# Patient Record
Sex: Male | Born: 1955
Health system: Southern US, Community
[De-identification: ages and names within clinical notes are randomized; demographics above are authoritative.]

## PROBLEM LIST (undated history)

## (undated) DIAGNOSIS — C4491 Basal cell carcinoma of skin, unspecified: Secondary | ICD-10-CM

## (undated) DIAGNOSIS — F419 Anxiety disorder, unspecified: Secondary | ICD-10-CM

## (undated) DIAGNOSIS — J189 Pneumonia, unspecified organism: Secondary | ICD-10-CM

## (undated) DIAGNOSIS — D049 Carcinoma in situ of skin, unspecified: Secondary | ICD-10-CM

## (undated) DIAGNOSIS — Z9889 Other specified postprocedural states: Secondary | ICD-10-CM

## (undated) DIAGNOSIS — M797 Fibromyalgia: Secondary | ICD-10-CM

## (undated) DIAGNOSIS — B1910 Unspecified viral hepatitis B without hepatic coma: Secondary | ICD-10-CM

## (undated) DIAGNOSIS — K219 Gastro-esophageal reflux disease without esophagitis: Secondary | ICD-10-CM

## (undated) DIAGNOSIS — E785 Hyperlipidemia, unspecified: Secondary | ICD-10-CM

## (undated) DIAGNOSIS — G8929 Other chronic pain: Secondary | ICD-10-CM

## (undated) DIAGNOSIS — E119 Type 2 diabetes mellitus without complications: Secondary | ICD-10-CM

## (undated) DIAGNOSIS — I10 Essential (primary) hypertension: Secondary | ICD-10-CM

## (undated) DIAGNOSIS — I2510 Atherosclerotic heart disease of native coronary artery without angina pectoris: Secondary | ICD-10-CM

## (undated) DIAGNOSIS — G4730 Sleep apnea, unspecified: Secondary | ICD-10-CM

## (undated) DIAGNOSIS — K8020 Calculus of gallbladder without cholecystitis without obstruction: Secondary | ICD-10-CM

## (undated) DIAGNOSIS — K56609 Unspecified intestinal obstruction, unspecified as to partial versus complete obstruction: Secondary | ICD-10-CM

## (undated) DIAGNOSIS — F32A Depression, unspecified: Secondary | ICD-10-CM

## (undated) DIAGNOSIS — R112 Nausea with vomiting, unspecified: Secondary | ICD-10-CM

## (undated) DIAGNOSIS — K635 Polyp of colon: Secondary | ICD-10-CM

## (undated) DIAGNOSIS — F329 Major depressive disorder, single episode, unspecified: Secondary | ICD-10-CM

## (undated) DIAGNOSIS — M1990 Unspecified osteoarthritis, unspecified site: Secondary | ICD-10-CM

## (undated) HISTORY — DX: Basal cell carcinoma of skin, unspecified: C44.91

## (undated) HISTORY — PX: HERNIA REPAIR: SHX51

## (undated) HISTORY — PX: XI ROBOTIC ASSISTED INGUINAL HERNIA REPAIR WITH MESH: SHX6706

## (undated) HISTORY — DX: Sleep apnea, unspecified: G47.30

## (undated) HISTORY — DX: Fibromyalgia: M79.7

## (undated) HISTORY — PX: EYE SURGERY: SHX253

## (undated) HISTORY — PX: BACK SURGERY: SHX140

## (undated) HISTORY — PX: CATARACT EXTRACTION W/ INTRAOCULAR LENS  IMPLANT, BILATERAL: SHX1307

## (undated) HISTORY — PX: SPINAL CORD STIMULATOR INSERTION: SHX5378

## (undated) HISTORY — DX: Gastro-esophageal reflux disease without esophagitis: K21.9

## (undated) HISTORY — DX: Polyp of colon: K63.5

## (undated) HISTORY — DX: Pneumonia, unspecified organism: J18.9

## (undated) HISTORY — PX: SPINE SURGERY: SHX786

## (undated) HISTORY — PX: CHOLECYSTECTOMY, LAPAROSCOPIC: SHX56

## (undated) HISTORY — DX: Unspecified viral hepatitis B without hepatic coma: B19.10

## (undated) HISTORY — PX: LUMBAR FUSION: SHX111

## (undated) HISTORY — DX: Hyperlipidemia, unspecified: E78.5

## (undated) HISTORY — DX: Unspecified osteoarthritis, unspecified site: M19.90

## (undated) HISTORY — PX: CHOLECYSTECTOMY: SHX55

## (undated) HISTORY — DX: Carcinoma in situ of skin, unspecified: D04.9

## (undated) HISTORY — DX: Calculus of gallbladder without cholecystitis without obstruction: K80.20

---
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
## 2004-08-26 HISTORY — PX: MICRODISCECTOMY LUMBAR: SUR864

## 2015-10-09 DIAGNOSIS — N529 Male erectile dysfunction, unspecified: Secondary | ICD-10-CM | POA: Insufficient documentation

## 2015-10-09 DIAGNOSIS — E291 Testicular hypofunction: Secondary | ICD-10-CM | POA: Insufficient documentation

## 2017-05-19 DIAGNOSIS — Z9689 Presence of other specified functional implants: Secondary | ICD-10-CM | POA: Insufficient documentation

## 2019-10-06 DIAGNOSIS — A419 Sepsis, unspecified organism: Secondary | ICD-10-CM | POA: Insufficient documentation

## 2020-01-25 ENCOUNTER — Other Ambulatory Visit (HOSPITAL_COMMUNITY): Payer: Self-pay

## 2020-01-31 ENCOUNTER — Other Ambulatory Visit (HOSPITAL_COMMUNITY): Payer: Self-pay

## 2020-02-29 ENCOUNTER — Other Ambulatory Visit (HOSPITAL_COMMUNITY): Payer: Self-pay

## 2020-03-13 ENCOUNTER — Other Ambulatory Visit (HOSPITAL_COMMUNITY): Payer: Self-pay

## 2020-04-06 ENCOUNTER — Emergency Department (HOSPITAL_BASED_OUTPATIENT_CLINIC_OR_DEPARTMENT_OTHER): Payer: Medicare Other

## 2020-04-06 ENCOUNTER — Inpatient Hospital Stay (HOSPITAL_BASED_OUTPATIENT_CLINIC_OR_DEPARTMENT_OTHER)
Admission: EM | Admit: 2020-04-06 | Discharge: 2020-04-12 | DRG: 390 | Disposition: A | Discharge status: Home / Self Care | Payer: Medicare Other | Attending: Internal Medicine | Admitting: Internal Medicine

## 2020-04-06 ENCOUNTER — Other Ambulatory Visit: Payer: Self-pay

## 2020-04-06 ENCOUNTER — Encounter (HOSPITAL_BASED_OUTPATIENT_CLINIC_OR_DEPARTMENT_OTHER): Payer: Self-pay | Admitting: *Deleted

## 2020-04-06 DIAGNOSIS — E785 Hyperlipidemia, unspecified: Secondary | ICD-10-CM | POA: Diagnosis present

## 2020-04-06 DIAGNOSIS — Z8719 Personal history of other diseases of the digestive system: Secondary | ICD-10-CM

## 2020-04-06 DIAGNOSIS — I10 Essential (primary) hypertension: Secondary | ICD-10-CM

## 2020-04-06 DIAGNOSIS — E876 Hypokalemia: Secondary | ICD-10-CM | POA: Diagnosis not present

## 2020-04-06 DIAGNOSIS — Z825 Family history of asthma and other chronic lower respiratory diseases: Secondary | ICD-10-CM

## 2020-04-06 DIAGNOSIS — K56600 Partial intestinal obstruction, unspecified as to cause: Secondary | ICD-10-CM | POA: Diagnosis not present

## 2020-04-06 DIAGNOSIS — Z79899 Other long term (current) drug therapy: Secondary | ICD-10-CM

## 2020-04-06 DIAGNOSIS — E119 Type 2 diabetes mellitus without complications: Secondary | ICD-10-CM

## 2020-04-06 DIAGNOSIS — R140 Abdominal distension (gaseous): Secondary | ICD-10-CM

## 2020-04-06 DIAGNOSIS — R0902 Hypoxemia: Secondary | ICD-10-CM | POA: Diagnosis present

## 2020-04-06 DIAGNOSIS — R1110 Vomiting, unspecified: Secondary | ICD-10-CM

## 2020-04-06 DIAGNOSIS — E1159 Type 2 diabetes mellitus with other circulatory complications: Secondary | ICD-10-CM

## 2020-04-06 DIAGNOSIS — Z9049 Acquired absence of other specified parts of digestive tract: Secondary | ICD-10-CM

## 2020-04-06 DIAGNOSIS — E860 Dehydration: Secondary | ICD-10-CM | POA: Diagnosis present

## 2020-04-06 DIAGNOSIS — Z20822 Contact with and (suspected) exposure to covid-19: Secondary | ICD-10-CM | POA: Diagnosis present

## 2020-04-06 DIAGNOSIS — Z881 Allergy status to other antibiotic agents status: Secondary | ICD-10-CM

## 2020-04-06 DIAGNOSIS — Z0189 Encounter for other specified special examinations: Secondary | ICD-10-CM

## 2020-04-06 DIAGNOSIS — Z794 Long term (current) use of insulin: Secondary | ICD-10-CM

## 2020-04-06 DIAGNOSIS — Z87891 Personal history of nicotine dependence: Secondary | ICD-10-CM

## 2020-04-06 DIAGNOSIS — Z7982 Long term (current) use of aspirin: Secondary | ICD-10-CM

## 2020-04-06 DIAGNOSIS — K56609 Unspecified intestinal obstruction, unspecified as to partial versus complete obstruction: Secondary | ICD-10-CM

## 2020-04-06 DIAGNOSIS — Z91040 Latex allergy status: Secondary | ICD-10-CM

## 2020-04-06 DIAGNOSIS — N400 Enlarged prostate without lower urinary tract symptoms: Secondary | ICD-10-CM | POA: Diagnosis present

## 2020-04-06 DIAGNOSIS — E1169 Type 2 diabetes mellitus with other specified complication: Secondary | ICD-10-CM

## 2020-04-06 HISTORY — DX: Anxiety disorder, unspecified: F41.9

## 2020-04-06 HISTORY — DX: Depression, unspecified: F32.A

## 2020-04-06 HISTORY — DX: Essential (primary) hypertension: I10

## 2020-04-06 HISTORY — DX: Type 2 diabetes mellitus without complications: E11.9

## 2020-04-06 LAB — URINALYSIS, ROUTINE W REFLEX MICROSCOPIC
Glucose, UA: 100 mg/dL — AB
Hgb urine dipstick: NEGATIVE
Ketones, ur: NEGATIVE mg/dL
Leukocytes,Ua: NEGATIVE
Nitrite: NEGATIVE
Protein, ur: >300 mg/dL — AB
Specific Gravity, Urine: >1.03 — ABNORMAL HIGH (ref 1.005–1.030)
pH: 6 (ref 5.0–8.0)

## 2020-04-06 LAB — CBC WITH DIFFERENTIAL/PLATELET
Abs Immature Granulocytes: 0.06 10*3/uL (ref 0.00–0.07)
Basophils Absolute: 0 10*3/uL (ref 0.0–0.1)
Basophils Relative: 0 %
Eosinophils Absolute: 0 10*3/uL (ref 0.0–0.5)
Eosinophils Relative: 0 %
HCT: 55.7 % — ABNORMAL HIGH (ref 39.0–52.0)
Hemoglobin: 17 g/dL (ref 13.0–17.0)
Immature Granulocytes: 0 %
Lymphocytes Relative: 6 %
Lymphs Abs: 1 10*3/uL (ref 0.7–4.0)
MCH: 26 pg (ref 26.0–34.0)
MCHC: 30.5 g/dL (ref 30.0–36.0)
MCV: 85 fL (ref 80.0–100.0)
Monocytes Absolute: 1.2 10*3/uL — ABNORMAL HIGH (ref 0.1–1.0)
Monocytes Relative: 7 %
Neutro Abs: 14.1 10*3/uL — ABNORMAL HIGH (ref 1.7–7.7)
Neutrophils Relative %: 87 %
Platelets: 427 10*3/uL — ABNORMAL HIGH (ref 150–400)
RBC: 6.55 MIL/uL — ABNORMAL HIGH (ref 4.22–5.81)
RDW: 15 % (ref 11.5–15.5)
WBC: 16.3 10*3/uL — ABNORMAL HIGH (ref 4.0–10.5)
nRBC: 0 % (ref 0.0–0.2)

## 2020-04-06 LAB — COMPREHENSIVE METABOLIC PANEL
ALT: 22 U/L (ref 0–44)
AST: 28 U/L (ref 15–41)
Albumin: 4.9 g/dL (ref 3.5–5.0)
Alkaline Phosphatase: 94 U/L (ref 38–126)
Anion gap: 14 (ref 5–15)
BUN: 14 mg/dL (ref 8–23)
CO2: 26 mmol/L (ref 22–32)
Calcium: 9 mg/dL (ref 8.9–10.3)
Chloride: 97 mmol/L — ABNORMAL LOW (ref 98–111)
Creatinine, Ser: 1.1 mg/dL (ref 0.61–1.24)
GFR calc Af Amer: >60 mL/min (ref >60)
GFR calc non Af Amer: >60 mL/min (ref >60)
Glucose, Bld: 221 mg/dL — ABNORMAL HIGH (ref 70–99)
Potassium: 3.7 mmol/L (ref 3.5–5.1)
Sodium: 137 mmol/L (ref 135–145)
Total Bilirubin: 1.1 mg/dL (ref 0.3–1.2)
Total Protein: 8.5 g/dL — ABNORMAL HIGH (ref 6.5–8.1)

## 2020-04-06 LAB — LIPASE, BLOOD: Lipase: 37 U/L (ref 11–51)

## 2020-04-06 LAB — URINALYSIS, MICROSCOPIC (REFLEX): WBC, UA: NONE SEEN WBC/hpf (ref 0–5)

## 2020-04-06 LAB — SARS CORONAVIRUS 2 BY RT PCR (HOSPITAL ORDER, PERFORMED IN ~~LOC~~ HOSPITAL LAB): SARS Coronavirus 2: NEGATIVE

## 2020-04-06 MED ORDER — IOHEXOL 300 MG/ML  SOLN
100.0000 mL | Freq: Once | INTRAMUSCULAR | Status: AC | PRN
  Administered 2020-04-06: 100 mL via INTRAVENOUS

## 2020-04-06 MED ORDER — SODIUM CHLORIDE 0.9 % IV BOLUS
1000.0000 mL | Freq: Once | INTRAVENOUS | Status: AC
  Administered 2020-04-06: 1000 mL via INTRAVENOUS

## 2020-04-06 MED ORDER — ONDANSETRON HCL 4 MG/2ML IJ SOLN
INTRAMUSCULAR | Status: AC
  Filled 2020-04-06: qty 2

## 2020-04-06 MED ORDER — ONDANSETRON HCL 4 MG/2ML IJ SOLN
4.0000 mg | Freq: Once | INTRAMUSCULAR | Status: AC
  Administered 2020-04-06: 4 mg via INTRAVENOUS

## 2020-04-06 NOTE — ED Triage Notes (Signed)
C/o abd pain n/v/d x 24 hrs

## 2020-04-06 NOTE — ED Notes (Signed)
States has been having N/V and D for the past few days, feels very weak

## 2020-04-06 NOTE — ED Provider Notes (Signed)
[facility] EMERGENCY DEPARTMENT Provider Note   CSN: [id number] Arrival date & time: 04/06/20  1848     History Chief Complaint  Patient presents with  . Diarrhea    Brandon [name] is a 64 y.o. male hx of DM, HTN, presenting with abdominal pain and vomiting.  Patient states that he did ate some Mindi Slicker yesterday and then shortly afterwards started vomiting.  He states that his abdomen is more distended.  He still passing gas and did have some loose stool.  He was concerned that he may have food poisoning.  He states that he actually had gangrenous cholecystitis back in January and required surgery and was in the hospital for 10 days in [place].   The history is provided by the patient.       Past Medical History:  Diagnosis Date  . Anxiety   . Depression   . Diabetes mellitus without complication (HCC)   . Hypertension     There are no problems to display for this patient.   Past Surgical History:  Procedure Laterality Date  . BACK SURGERY    . CHOLECYSTECTOMY    . EYE SURGERY         History reviewed. No pertinent family history.  Social History   Tobacco Use  . Smoking status: Never Smoker  . Smokeless tobacco: Never Used  Substance Use Topics  . Alcohol use: Not Currently  . Drug use: Not Currently    Home Medications Prior to Admission medications   Not on File    Allergies    Ancef [cefazolin], Latex, and Levaquin [levofloxacin]  Review of Systems   Review of Systems  Gastrointestinal: Positive for abdominal distention, diarrhea and vomiting.  All other systems reviewed and are negative.   Physical Exam Updated Vital Signs BP (!) 152/90   Pulse (!) 122   Temp 99 F (37.2 C)   Resp 18   Ht 5\' 9"  (1.753 m)   Wt 105.2 kg   SpO2 98%   BMI 34.26 kg/m   Physical Exam Vitals and nursing note reviewed.  Constitutional:      Comments: Uncomfortable, vomiting, dehydrated   HENT:     Head: Normocephalic.     Nose: Nose  normal.     Mouth/Throat:     Mouth: Mucous membranes are dry.  Eyes:     Pupils: Pupils are equal, round, and reactive to light.  Cardiovascular:     Rate and Rhythm: Normal rate and regular rhythm.     Pulses: Normal pulses.     Heart sounds: Normal heart sounds.  Pulmonary:     Effort: Pulmonary effort is normal.     Breath sounds: Normal breath sounds.  Abdominal:     Comments: Distended, + RUQ tenderness and mild guarding  Musculoskeletal:        General: Normal range of motion.     Cervical back: Normal range of motion.  Skin:    General: Skin is warm.     Capillary Refill: Capillary refill takes less than 2 seconds.  Neurological:     General: No focal deficit present.     Mental Status: He is alert and oriented to person, place, and time.  Psychiatric:        Mood and Affect: Mood normal.        Behavior: Behavior normal.     ED Results / Procedures / Treatments   Labs (all labs ordered are listed, but only abnormal results  are displayed) Labs Reviewed  CBC WITH DIFFERENTIAL/PLATELET - Abnormal; Notable for the following components:      Result Value   WBC 16.3 (*)    RBC 6.55 (*)    HCT 55.7 (*)    Platelets 427 (*)    Neutro Abs 14.1 (*)    Monocytes Absolute 1.2 (*)    All other components within normal limits  COMPREHENSIVE METABOLIC PANEL - Abnormal; Notable for the following components:   Chloride 97 (*)    Glucose, Bld 221 (*)    Total Protein 8.5 (*)    All other components within normal limits  URINALYSIS, ROUTINE W REFLEX MICROSCOPIC - Abnormal; Notable for the following components:   Specific Gravity, Urine >1.030 (*)    Glucose, UA 100 (*)    Bilirubin Urine SMALL (*)    Protein, ur >300 (*)    All other components within normal limits  URINALYSIS, MICROSCOPIC (REFLEX) - Abnormal; Notable for the following components:   Bacteria, UA RARE (*)    All other components within normal limits  SARS CORONAVIRUS 2 BY RT PCR (HOSPITAL ORDER, PERFORMED  IN [facility])  LIPASE, BLOOD    EKG None  Radiology CT ABDOMEN PELVIS W CONTRAST  Result Date: 04/06/2020 CLINICAL DATA:  Abdominal pain, nausea, vomiting, diarrhea EXAM: CT ABDOMEN AND PELVIS WITH CONTRAST TECHNIQUE: Multidetector CT imaging of the abdomen and pelvis was performed using the standard protocol following bolus administration of intravenous contrast. CONTRAST:  OMNIPAQUE IOHEXOL 300 MG/ML  SOLN COMPARISON:  None. FINDINGS: Lower chest: Moderate right coronary artery calcification. Global cardiac size within normal limits. No pericardial effusion. Visualized lung bases are clear bilaterally. Hepatobiliary: Mild hepatic steatosis. Liver otherwise unremarkable. Status post cholecystectomy. No intra or extrahepatic biliary ductal dilation. Pancreas: Unremarkable Spleen: Unremarkable Adrenals/Urinary Tract: The adrenal glands are unremarkable. The kidneys are normal in size and position. Simple cortical cyst noted within the upper pole of the right kidney. The kidneys are otherwise unremarkable. The bladder is unremarkable. Stomach/Bowel: A developing mid small bowel obstruction is identified with a point of transition seen within the right mid abdomen best noted on axial image # 42, sagittal image # 44 and coronal image # 17. Mild tethering of the small bowel in this location suggests that this may relate to an underlying adhesion. The obstruction appears incomplete at this point as the a small amount of gas and fluid is seen downstream from this point. The stomach is unremarkable. Mild sigmoid and descending colonic diverticulosis. The large bowel is otherwise unremarkable. Appendix normal. No free intraperitoneal gas or fluid. Vascular/Lymphatic: The abdominal vasculature is age-appropriate with mild aortoiliac atherosclerotic calcification. No aneurysm. No pathologic adenopathy within the abdomen and pelvis. Reproductive: Prostate is unremarkable. Penile prosthesis is in  place with the reservoir noted within the left inferior rectus sheath. Other: Tiny right spigelian hernia is seen with herniation of a small amount of mesenteric fat. Musculoskeletal: L4-5 lumbar fusion with instrumentation has been performed. Dorsal column stimulator leads enter the spinal canal at L1-2 with the power pack placed within the subcutaneous fat of the left gluteal region. No acute bone abnormality. IMPRESSION: 1. High-grade partial or developing complete mid small bowel obstruction with a point of transition seen within the right mid abdomen. 2. Mild hepatic steatosis. Aortic Atherosclerosis (ICD10-I70.0). Electronically Signed   By: Helyn Numbers MD   On: 04/06/2020 22:31    Procedures Procedures (including critical care time)  Medications Ordered in ED Medications  ondansetron (  ZOFRAN) 4 MG/2ML injection (has no administration in time range)  sodium chloride 0.9 % bolus 1,000 mL (1,000 mLs Intravenous New Bag/Given 04/06/20 2137)  ondansetron (ZOFRAN) injection 4 mg (4 mg Intravenous Given 04/06/20 2136)  iohexol (OMNIPAQUE) 300 MG/ML solution 100 mL (100 mLs Intravenous Contrast Given 04/06/20 2205)    ED Course  I have reviewed the triage vital signs and the nursing notes.  Pertinent labs & imaging results that were available during my care of the patient were reviewed by me and considered in my medical decision making (see chart for details).    MDM Rules/Calculators/A&P                         Brandon [name] is a 64 y.o. male here presenting with abdominal pain and vomiting.  Patient states that he ate Mindi Slicker so consider gastroenteritis.  He also had cholecystectomy [place] so consider small bowel obstruction as well.  Plan to get CBC, CMP, CT abdomen pelvis.  Will hydrate and give Zofran and reassess.  10:54 PM CT showed near complete small bowel obstruction with transition point at the right mid quadrant.  His white blood cell count is 16 and his Covid is negative.  I  talked to Dr. Sid Falcon from surgery who will see patient as consult.  I ordered NG tube and patient will be admitted to the medicine service.   Final Clinical Impression(s) / ED Diagnoses Final diagnoses:  None    Rx / DC Orders ED Discharge Orders    None       Charlynne Pander, MD 04/06/20 2255

## 2020-04-06 NOTE — ED Notes (Signed)
EDP in to speak with pt re: CT results, will keep pt NPO until further orders

## 2020-04-06 NOTE — ED Notes (Signed)
Had Laparoscopic Chole in Feb 2021

## 2020-04-07 ENCOUNTER — Inpatient Hospital Stay (HOSPITAL_COMMUNITY): Payer: Medicare Other

## 2020-04-07 ENCOUNTER — Encounter (HOSPITAL_COMMUNITY): Payer: Self-pay | Admitting: Family Medicine

## 2020-04-07 DIAGNOSIS — E1159 Type 2 diabetes mellitus with other circulatory complications: Secondary | ICD-10-CM

## 2020-04-07 DIAGNOSIS — Z794 Long term (current) use of insulin: Secondary | ICD-10-CM | POA: Diagnosis not present

## 2020-04-07 DIAGNOSIS — I10 Essential (primary) hypertension: Secondary | ICD-10-CM | POA: Diagnosis present

## 2020-04-07 DIAGNOSIS — Z7982 Long term (current) use of aspirin: Secondary | ICD-10-CM | POA: Diagnosis not present

## 2020-04-07 DIAGNOSIS — E860 Dehydration: Secondary | ICD-10-CM | POA: Diagnosis present

## 2020-04-07 DIAGNOSIS — Z881 Allergy status to other antibiotic agents status: Secondary | ICD-10-CM | POA: Diagnosis not present

## 2020-04-07 DIAGNOSIS — K56609 Unspecified intestinal obstruction, unspecified as to partial versus complete obstruction: Secondary | ICD-10-CM | POA: Diagnosis present

## 2020-04-07 DIAGNOSIS — R0902 Hypoxemia: Secondary | ICD-10-CM | POA: Diagnosis present

## 2020-04-07 DIAGNOSIS — E119 Type 2 diabetes mellitus without complications: Secondary | ICD-10-CM

## 2020-04-07 DIAGNOSIS — Z0189 Encounter for other specified special examinations: Secondary | ICD-10-CM | POA: Diagnosis not present

## 2020-04-07 DIAGNOSIS — E785 Hyperlipidemia, unspecified: Secondary | ICD-10-CM | POA: Diagnosis present

## 2020-04-07 DIAGNOSIS — Z9049 Acquired absence of other specified parts of digestive tract: Secondary | ICD-10-CM | POA: Diagnosis not present

## 2020-04-07 DIAGNOSIS — Z79899 Other long term (current) drug therapy: Secondary | ICD-10-CM | POA: Diagnosis not present

## 2020-04-07 DIAGNOSIS — Z87891 Personal history of nicotine dependence: Secondary | ICD-10-CM | POA: Diagnosis not present

## 2020-04-07 DIAGNOSIS — Z8719 Personal history of other diseases of the digestive system: Secondary | ICD-10-CM | POA: Diagnosis not present

## 2020-04-07 DIAGNOSIS — K56600 Partial intestinal obstruction, unspecified as to cause: Secondary | ICD-10-CM | POA: Diagnosis present

## 2020-04-07 DIAGNOSIS — Z91040 Latex allergy status: Secondary | ICD-10-CM | POA: Diagnosis not present

## 2020-04-07 DIAGNOSIS — E876 Hypokalemia: Secondary | ICD-10-CM | POA: Diagnosis not present

## 2020-04-07 DIAGNOSIS — E1169 Type 2 diabetes mellitus with other specified complication: Secondary | ICD-10-CM

## 2020-04-07 DIAGNOSIS — Z20822 Contact with and (suspected) exposure to covid-19: Secondary | ICD-10-CM | POA: Diagnosis present

## 2020-04-07 DIAGNOSIS — N400 Enlarged prostate without lower urinary tract symptoms: Secondary | ICD-10-CM | POA: Diagnosis present

## 2020-04-07 DIAGNOSIS — Z825 Family history of asthma and other chronic lower respiratory diseases: Secondary | ICD-10-CM | POA: Diagnosis not present

## 2020-04-07 LAB — CBC
HCT: 52.3 % — ABNORMAL HIGH (ref 39.0–52.0)
Hemoglobin: 16.1 g/dL (ref 13.0–17.0)
MCH: 26.3 pg (ref 26.0–34.0)
MCHC: 30.8 g/dL (ref 30.0–36.0)
MCV: 85.5 fL (ref 80.0–100.0)
Platelets: 362 10*3/uL (ref 150–400)
RBC: 6.12 MIL/uL — ABNORMAL HIGH (ref 4.22–5.81)
RDW: 14.6 % (ref 11.5–15.5)
WBC: 9.1 10*3/uL (ref 4.0–10.5)
nRBC: 0 % (ref 0.0–0.2)

## 2020-04-07 LAB — GLUCOSE, CAPILLARY
Glucose-Capillary: 184 mg/dL — ABNORMAL HIGH (ref 70–99)
Glucose-Capillary: 197 mg/dL — ABNORMAL HIGH (ref 70–99)
Glucose-Capillary: 159 mg/dL — ABNORMAL HIGH (ref 70–99)
Glucose-Capillary: 166 mg/dL — ABNORMAL HIGH (ref 70–99)
Glucose-Capillary: 284 mg/dL — ABNORMAL HIGH (ref 70–99)

## 2020-04-07 LAB — CREATININE, SERUM
Creatinine, Ser: 0.89 mg/dL (ref 0.61–1.24)
GFR calc Af Amer: >60 mL/min (ref >60)
GFR calc non Af Amer: >60 mL/min (ref >60)

## 2020-04-07 LAB — HEMOGLOBIN A1C
Hgb A1c MFr Bld: 7.2 % — ABNORMAL HIGH (ref 4.8–5.6)
Mean Plasma Glucose: 159.94 mg/dL

## 2020-04-07 LAB — HIV ANTIBODY (ROUTINE TESTING W REFLEX): HIV Screen 4th Generation wRfx: NONREACTIVE

## 2020-04-07 MED ORDER — ENOXAPARIN SODIUM 40 MG/0.4ML ~~LOC~~ SOLN
40.0000 mg | SUBCUTANEOUS | Status: DC
  Administered 2020-04-07 – 2020-04-12 (×6): 40 mg via SUBCUTANEOUS
  Filled 2020-04-07 (×6): qty 0.4

## 2020-04-07 MED ORDER — MORPHINE SULFATE (PF) 4 MG/ML IV SOLN
4.0000 mg | Freq: Once | INTRAVENOUS | Status: AC
  Administered 2020-04-07: 4 mg via INTRAVENOUS
  Filled 2020-04-07: qty 1

## 2020-04-07 MED ORDER — ONDANSETRON HCL 4 MG/2ML IJ SOLN
4.0000 mg | Freq: Once | INTRAMUSCULAR | Status: AC
  Administered 2020-04-07: 4 mg via INTRAVENOUS
  Filled 2020-04-07: qty 2

## 2020-04-07 MED ORDER — DIATRIZOATE MEGLUMINE & SODIUM 66-10 % PO SOLN
90.0000 mL | Freq: Once | ORAL | Status: AC
  Administered 2020-04-07: 90 mL via NASOGASTRIC
  Filled 2020-04-07: qty 90

## 2020-04-07 MED ORDER — POTASSIUM CHLORIDE IN NACL 40-0.9 MEQ/L-% IV SOLN
INTRAVENOUS | Status: DC
  Filled 2020-04-07: qty 1000

## 2020-04-07 MED ORDER — ONDANSETRON HCL 4 MG PO TABS: 4.0000 mg | ORAL_TABLET | Freq: Four times a day (QID) | ORAL | Status: DC | PRN

## 2020-04-07 MED ORDER — LORAZEPAM 2 MG/ML IJ SOLN
0.5000 mg | Freq: Four times a day (QID) | INTRAMUSCULAR | Status: DC | PRN
  Administered 2020-04-07 – 2020-04-10 (×3): 0.5 mg via INTRAVENOUS
  Filled 2020-04-07 (×3): qty 1

## 2020-04-07 MED ORDER — TAMSULOSIN HCL 0.4 MG PO CAPS
0.4000 mg | ORAL_CAPSULE | Freq: Every day | ORAL | Status: DC
  Administered 2020-04-09 – 2020-04-12 (×4): 0.4 mg via ORAL
  Filled 2020-04-07 (×5): qty 1

## 2020-04-07 MED ORDER — HYDRALAZINE HCL 25 MG PO TABS: 25.0000 mg | ORAL_TABLET | Freq: Four times a day (QID) | ORAL | Status: DC | PRN

## 2020-04-07 MED ORDER — ONDANSETRON HCL 4 MG/2ML IJ SOLN
4.0000 mg | Freq: Four times a day (QID) | INTRAMUSCULAR | Status: DC | PRN
  Administered 2020-04-08 (×3): 4 mg via INTRAVENOUS
  Filled 2020-04-07 (×3): qty 2

## 2020-04-07 MED ORDER — POTASSIUM CHLORIDE 2 MEQ/ML IV SOLN: INTRAVENOUS | Status: DC

## 2020-04-07 MED ORDER — INSULIN DETEMIR 100 UNIT/ML ~~LOC~~ SOLN
5.0000 [IU] | Freq: Two times a day (BID) | SUBCUTANEOUS | Status: DC
  Administered 2020-04-07 – 2020-04-12 (×11): 5 [IU] via SUBCUTANEOUS
  Filled 2020-04-07 (×13): qty 0.05

## 2020-04-07 MED ORDER — SODIUM CHLORIDE 0.9 % IV BOLUS
1000.0000 mL | Freq: Once | INTRAVENOUS | Status: AC
  Administered 2020-04-07: 1000 mL via INTRAVENOUS

## 2020-04-07 MED ORDER — MORPHINE SULFATE (PF) 4 MG/ML IV SOLN
4.0000 mg | INTRAVENOUS | Status: DC | PRN
  Administered 2020-04-08: 4 mg via INTRAVENOUS
  Filled 2020-04-07: qty 1

## 2020-04-07 MED ORDER — LORAZEPAM 2 MG/ML IJ SOLN
1.0000 mg | Freq: Once | INTRAMUSCULAR | Status: AC
  Administered 2020-04-07: 1 mg via INTRAVENOUS
  Filled 2020-04-07: qty 1

## 2020-04-07 MED ORDER — ORAL CARE MOUTH RINSE
15.0000 mL | Freq: Two times a day (BID) | OROMUCOSAL | Status: DC
  Administered 2020-04-07 – 2020-04-12 (×7): 15 mL via OROMUCOSAL

## 2020-04-07 MED ORDER — INSULIN ASPART 100 UNIT/ML ~~LOC~~ SOLN
0.0000 [IU] | SUBCUTANEOUS | Status: DC
  Administered 2020-04-07 (×3): 2 [IU] via SUBCUTANEOUS
  Administered 2020-04-07: 5 [IU] via SUBCUTANEOUS
  Administered 2020-04-08: 3 [IU] via SUBCUTANEOUS
  Administered 2020-04-08: 1 [IU] via SUBCUTANEOUS
  Administered 2020-04-08: 2 [IU] via SUBCUTANEOUS
  Administered 2020-04-08: 1 [IU] via SUBCUTANEOUS
  Administered 2020-04-09 (×3): 2 [IU] via SUBCUTANEOUS
  Administered 2020-04-09 – 2020-04-10 (×4): 1 [IU] via SUBCUTANEOUS
  Administered 2020-04-10: 2 [IU] via SUBCUTANEOUS
  Administered 2020-04-10: 1 [IU] via SUBCUTANEOUS
  Administered 2020-04-10: 2 [IU] via SUBCUTANEOUS
  Administered 2020-04-11 (×2): 1 [IU] via SUBCUTANEOUS
  Administered 2020-04-11: 2 [IU] via SUBCUTANEOUS
  Administered 2020-04-11: 1 [IU] via SUBCUTANEOUS
  Administered 2020-04-11 – 2020-04-12 (×3): 2 [IU] via SUBCUTANEOUS
  Administered 2020-04-12: 1 [IU] via SUBCUTANEOUS

## 2020-04-07 MED ORDER — KCL IN DEXTROSE-NACL 40-5-0.45 MEQ/L-%-% IV SOLN
INTRAVENOUS | Status: DC
  Filled 2020-04-07 (×3): qty 1000

## 2020-04-07 NOTE — Progress Notes (Signed)
Day shift nurse informed per MD that NG tube can be clamped and if less than 300 ml residual after tube is unclamped that NG tube can be pulled. Given report to unclamp tube at 2130 and check for residual, tube was unclamped @ 2130 and only approximately 100 ml residual, tube was pulled at 2245. Patient has no complaints of N&V prior to pulling tube and currently has none. Patient is consuming oral fluids. Patient is stable and will continue to be monitored. Marcelle Overlie

## 2020-04-07 NOTE — ED Notes (Signed)
Pt was incontinent of moderate amount of liquid yellowish brown stool  Peri care performed  Brief applied

## 2020-04-07 NOTE — ED Notes (Signed)
Safety measures in place, sr x 2 up, wife at side, call bell within reach, stretcher in lowest position. Instructed not to get up without staff in room

## 2020-04-07 NOTE — ED Notes (Signed)
Post IV ativan administration, began closing eyes, appears more comfortable, not as restless, POX decreased to 88% on RA, 2lpm via [place] initiated , POX increased to 93%, increased to 4lpm via [place], will cont to monitor and observe

## 2020-04-07 NOTE — Progress Notes (Signed)
Pt is very anxious, requesting for Ativan. MD is notified via secured chat. New orders received.

## 2020-04-07 NOTE — TOC Progression Note (Signed)
Transition of Care [facility]) - Progression Note    Patient Details  Name: Brandon [name] MRN: [id number] Date of Birth: 12-08-1955  Transition of Care [facility]) CM/SW Contact  Clearance Coots, LCSW Phone Number: 04/07/2020, 1:52 PM  Clinical Narrative:    Patient recently moved from [place] and inquired about PCP's in the area. CSW instructed the patient to call the number on the back of his insurance card to determine providers in network with his insurance. Patient report understanding and will follow up. CSW will make self available if any additional questions arise.     Expected Discharge Plan: Home/Self Care Barriers to Discharge: Continued Medical Work up  Expected Discharge Plan and Services Expected Discharge Plan: Home/Self Care                         DME Arranged: N/A         HH Arranged: NA HH Agency: NA         Social Determinants of Health (SDOH) Interventions    Readmission Risk Interventions No flowsheet data found.

## 2020-04-07 NOTE — H&P (Signed)
History and Physical  MIKAELE STECHER WCB:[id number] DOB: 03/05/1956 DOA: 04/06/2020  PCP: Patient, No Pcp Per Patient coming from: Home  I have personally briefly reviewed patient's old medical records in [facility]   Chief Complaint: Abdominal pain and diarrhea accompanied by nausea and vomiting  HPI: BAYLEY HURN is a 64 y.o. male past medical history of with a recent history of gangrenous cholecystitis back in January where he spent 10 days in the hospital in [place] where he used to live, diabetes mellitus and hypertension comes in complaining of abdominal pain and vomiting that started the day prior to admission.  He stated his abdomen was more more distended he started passing gas and having some loose stools.  [facility]: To be tachycardic and hypoxic, white blood cell count of 16.3 chest CT showed small bowel obstruction, NG tube was placed per hour 1000 L, was started on IV fluids and transferred to [facility].   Review of Systems: All systems reviewed and apart from history of presenting illness, are negative.  Past Medical History:  Diagnosis Date  . Anxiety   . Depression   . Diabetes mellitus without complication (HCC)   . Hypertension    Past Surgical History:  Procedure Laterality Date  . BACK SURGERY    . CHOLECYSTECTOMY    . EYE SURGERY     Social History:  reports that he has quit smoking. He has never used smokeless tobacco. He reports previous alcohol use. He reports previous drug use.   Allergies  Allergen Reactions  . Ancef [Cefazolin]   . Latex   . Levaquin [Levofloxacin]     Family History  Problem Relation Age of Onset  . COPD Mother   . Cancer Father      Prior to Admission medications   Medication Sig Start Date End Date Taking? Authorizing Provider  AMLODIPINE BENZOATE PO Take 10 mg by mouth.   Yes [provider]  aspirin 81 MG chewable tablet Chew 81 mg by mouth daily.   Yes [provider]    empagliflozin (JARDIANCE) 25 MG TABS tablet Take 25 mg by mouth daily.   Yes [provider]  Insulin Degludec (TRESIBA) 100 UNIT/ML SOLN Inject 60 Units into the skin.   Yes [provider]  insulin lispro (HUMALOG) 100 UNIT/ML injection Inject into the skin 3 (three) times daily before meals.   Yes [provider]  lovastatin (ALTOPREV) 40 MG 24 hr tablet Take 40 mg by mouth at bedtime.   Yes [provider]  tamsulosin (FLOMAX) 0.4 MG CAPS capsule Take 0.4 mg by mouth.   Yes [provider]   Physical Exam: Vitals:   04/07/20 0130 04/07/20 0256 04/07/20 0432 04/07/20 0614  BP: (!) 154/90 (!) 164/95 (!) 141/92 133/86  Pulse: (!) 112 (!) 113 (!) 109 (!) 107  Resp: 16 14  17   Temp:    98.5 F (36.9 C)  TempSrc:    Oral  SpO2: 96% 95% 98% 97%  Weight:      Height:         General exam: Moderately built and nourished patient, lying comfortably supine on the gurney in no obvious distress.  Head, eyes and ENT: Nontraumatic and normocephalic. Pupils equally reacting to light and accommodation. Oral mucosa moist.  Neck: Supple. No JVD, carotid bruit or thyromegaly.  Lymphatics: No lymphadenopathy.  Respiratory system: Clear to auscultation. No increased work of breathing.  Cardiovascular system: S1 and S2  heard, RRR. No JVD, murmurs, gallops, clicks or pedal edema.  Gastrointestinal system: Positive bowel sounds distended soft with diffuse tenderness tympanic no rebound or guarding  Central nervous system: Alert and oriented. No focal neurological deficits.  Extremities: Symmetric 5 x 5 power. Peripheral pulses symmetrically felt.   Skin: No rashes or acute findings.  Musculoskeletal system: Negative exam.  Psychiatry: Pleasant and cooperative.   Labs on Admission:  Basic Metabolic Panel: Recent Labs  Lab 04/06/20 1906  NA 137  K 3.7  CL 97*  CO2 26  GLUCOSE 221*  BUN 14  CREATININE 1.10  CALCIUM 9.0   Liver Function  Tests: Recent Labs  Lab 04/06/20 1906  AST 28  ALT 22  ALKPHOS 94  BILITOT 1.1  PROT 8.5*  ALBUMIN 4.9   Recent Labs  Lab 04/06/20 1906  LIPASE 37   No results for input(s): AMMONIA in the last 168 hours. CBC: Recent Labs  Lab 04/06/20 1906  WBC 16.3*  NEUTROABS 14.1*  HGB 17.0  HCT 55.7*  MCV 85.0  PLT 427*   Cardiac Enzymes: No results for input(s): CKTOTAL, CKMB, CKMBINDEX, TROPONINI in the last 168 hours.  BNP (last 3 results) No results for input(s): PROBNP in the last 8760 hours. CBG: Recent Labs  Lab 04/07/20 0609  GLUCAP 184*    Radiological Exams on Admission: DG Abdomen 1 View  Result Date: 04/06/2020 CLINICAL DATA:  NG tube placement EXAM: ABDOMEN - 1 VIEW COMPARISON:  04/06/2020 FINDINGS: Ascending thoracic stimulator leads. Esophageal tube tip and side port overlie the proximal stomach. Partially visualized dilated small bowel consistent with obstruction. IMPRESSION: Esophageal tube tip overlies the proximal stomach. Electronically Signed   By: Jasmine Pang M.D.   On: 04/06/2020 23:43   CT ABDOMEN PELVIS W CONTRAST  Result Date: 04/06/2020 CLINICAL DATA:  Abdominal pain, nausea, vomiting, diarrhea EXAM: CT ABDOMEN AND PELVIS WITH CONTRAST TECHNIQUE: Multidetector CT imaging of the abdomen and pelvis was performed using the standard protocol following bolus administration of intravenous contrast. CONTRAST:  OMNIPAQUE IOHEXOL 300 MG/ML  SOLN COMPARISON:  None. FINDINGS: Lower chest: Moderate right coronary artery calcification. Global cardiac size within normal limits. No pericardial effusion. Visualized lung bases are clear bilaterally. Hepatobiliary: Mild hepatic steatosis. Liver otherwise unremarkable. Status post cholecystectomy. No intra or extrahepatic biliary ductal dilation. Pancreas: Unremarkable Spleen: Unremarkable Adrenals/Urinary Tract: The adrenal glands are unremarkable. The kidneys are normal in size and position. Simple cortical cyst  noted within the upper pole of the right kidney. The kidneys are otherwise unremarkable. The bladder is unremarkable. Stomach/Bowel: A developing mid small bowel obstruction is identified with a point of transition seen within the right mid abdomen best noted on axial image # 42, sagittal image # 44 and coronal image # 17. Mild tethering of the small bowel in this location suggests that this may relate to an underlying adhesion. The obstruction appears incomplete at this point as the a small amount of gas and fluid is seen downstream from this point. The stomach is unremarkable. Mild sigmoid and descending colonic diverticulosis. The large bowel is otherwise unremarkable. Appendix normal. No free intraperitoneal gas or fluid. Vascular/Lymphatic: The abdominal vasculature is age-appropriate with mild aortoiliac atherosclerotic calcification. No aneurysm. No pathologic adenopathy within the abdomen and pelvis. Reproductive: Prostate is unremarkable. Penile prosthesis is in place with the reservoir noted within the left inferior rectus sheath. Other: Tiny right spigelian hernia is seen with herniation of a small amount of mesenteric fat. Musculoskeletal: L4-5 lumbar fusion with  instrumentation has been performed. Dorsal column stimulator leads enter the spinal canal at L1-2 with the power pack placed within the subcutaneous fat of the left gluteal region. No acute bone abnormality. IMPRESSION: 1. High-grade partial or developing complete mid small bowel obstruction with a point of transition seen within the right mid abdomen. 2. Mild hepatic steatosis. Aortic Atherosclerosis (ICD10-I70.0). Electronically Signed   By: Helyn Numbers MD   On: 04/06/2020 22:31    EKG: Independently reviewed.  Pending  Assessment/Plan SBO (small bowel obstruction) (HCC) Had recent cholecystectomy due to a gangrenous gallbladder. We will place n.p.o. drop NG tube to intermittent suction. Monitor electrolytes and replete as needed.   Start him on D5 half-normal with potassium supplements.  Ambulate as much as possible. We will start him on the small bowel protocol, will consult surgery.  Essential hypertension Hold all antihypertensive medication, will give him hydralazine IV..  Diabetes mellitus type 2, controlled, without complications (HCC) Check an A1c, currently n.p.o. start him on Levemir 5 units twice daily and sliding scale scale insulin, remember that he is n.p.o. CBGs every 4.    DVT Prophylaxis: lovenox Code Status: full  Family Communication: wife  Disposition Plan: inpatient, per surgery      It is my clinical opinion that admission to INPATIENT is reasonable and necessary in this 64 y.o. male past medical history of hypertension and diabetes with recent head cholecystectomy comes in with abdominal pain nausea vomiting and diarrhea was found to have SBO NG tube was placed he was started on IV fluids and narcotics for pain control surgery has been consulted.  Given the aforementioned, the predictability of an adverse outcome is felt to be significant. I expect that the patient will require at least 2 midnights in the hospital to treat this condition.  Marinda Elk MD [facility]   04/07/2020, 7:26 AM

## 2020-04-07 NOTE — ED Notes (Signed)
Report given to [facility]. 

## 2020-04-07 NOTE — ED Notes (Signed)
Pt transferred to [facility] via [facility]

## 2020-04-07 NOTE — ED Notes (Signed)
KUB requested for NG Tube validation, reviewed by this RN and EDP

## 2020-04-07 NOTE — Consult Note (Signed)
[facility] Surgery Consult Note  Brandon [name] 1956/03/16  [id number].    Requesting MD: Marinda Elk Chief Complaint/Reason for Consult: SBO  HPI:  Brandon [name] is a 64yo male PMH HTN, DM, HLD who was admitted to [facility] last night with SBO. Patient states that about 2 days ago he developed diffuse abdominal pain. Pain gradually became worse and more constant. Associated with multiple episodes of nausea and vomiting. He also reports several loose stools, last BM yesterday evening. No flatus today. No prior h/o SBO. In the ED he was noted to be tachycardic and hypoxic. Afebrile. WBC 16.3, now 9.1. CT scan shows high-grade partial or developing complete mid small bowel obstruction with a point of transition seen within the right mid abdomen. NG tube was placed and patient admitted to the medical service. General surgery asked to see.  -Abdominal surgical history: right inguinal hernia repair x2, umbilical hernia repair, laparoscopic cholecystectomy 08/2019 >> all surgeries done in [place] -Anticoagulants: none -Former smoker, quit in 2006 -Just moved to [place] from [place] in July 2021 -Employment: retired Buyer, retail, worked at [employer] years ago  Review of Systems  Constitutional: Positive for malaise/fatigue. Negative for fever.  Respiratory: Negative.   Cardiovascular: Negative.   Gastrointestinal: Positive for abdominal pain, diarrhea, nausea and vomiting.  Genitourinary: Negative.   Musculoskeletal: Negative.   Skin: Negative.    All systems reviewed and otherwise negative except for as above  Family History  Problem Relation Age of Onset  . COPD Mother   . Cancer Father     Past Medical History:  Diagnosis Date  . Anxiety   . Depression   . Diabetes mellitus without complication (HCC)   . Hypertension     Past Surgical History:  Procedure Laterality Date  . BACK SURGERY    . CHOLECYSTECTOMY    . EYE SURGERY      Social History:  reports that  he has quit smoking. He has never used smokeless tobacco. He reports previous alcohol use. He reports previous drug use.  Allergies:  Allergies  Allergen Reactions  . Ancef [Cefazolin] Rash  . Latex Rash  . Levaquin [Levofloxacin] Rash    Medications Prior to Admission  Medication Sig Dispense Refill  . amLODipine (NORVASC) 10 MG tablet Take 10 mg by mouth daily.    . ARIPiprazole (ABILIFY) 10 MG tablet Take 10 mg by mouth daily.    Marland Kitchen aspirin 81 MG chewable tablet Chew 81 mg by mouth daily.    . empagliflozin (JARDIANCE) 25 MG TABS tablet Take 25 mg by mouth daily.    . Insulin Degludec (TRESIBA) 100 UNIT/ML SOLN Inject 60 Units into the skin.    Marland Kitchen insulin lispro (HUMALOG) 100 UNIT/ML injection Inject 0-10 Units into the skin 3 (three) times daily before meals. Sliding Scale    . lovastatin (ALTOPREV) 40 MG 24 hr tablet Take 40 mg by mouth at bedtime.    . pantoprazole (PROTONIX) 40 MG tablet Take 40 mg by mouth daily.    . pioglitazone (ACTOS) 30 MG tablet Take 30 mg by mouth daily.    . tamsulosin (FLOMAX) 0.4 MG CAPS capsule Take 0.4 mg by mouth.      Prior to Admission medications   Medication Sig Start Date End Date Taking? Authorizing Provider  amLODipine (NORVASC) 10 MG tablet Take 10 mg by mouth daily.   Yes [provider]  ARIPiprazole (ABILIFY) 10 MG tablet Take 10 mg by mouth daily.   Yes [provider]  aspirin 81 MG chewable tablet Chew 81 mg by mouth daily.   Yes [provider]  empagliflozin (JARDIANCE) 25 MG TABS tablet Take 25 mg by mouth daily.   Yes [provider]  Insulin Degludec (TRESIBA) 100 UNIT/ML SOLN Inject 60 Units into the skin.   Yes [provider]  insulin lispro (HUMALOG) 100 UNIT/ML injection Inject 0-10 Units into the skin 3 (three) times daily before meals. Sliding Scale   Yes [provider]  lovastatin (ALTOPREV) 40 MG 24 hr tablet Take 40 mg by mouth at bedtime.   Yes [provider]  pantoprazole (PROTONIX) 40 MG tablet Take 40 mg by mouth daily.   Yes [provider]  pioglitazone (ACTOS) 30 MG tablet Take 30 mg by mouth daily.   Yes [provider]  tamsulosin (FLOMAX) 0.4 MG CAPS capsule Take 0.4 mg by mouth.   Yes [provider]    Blood pressure (!) 143/75, pulse 97, temperature 98.4 F (36.9 C), temperature source Oral, resp. rate 18, height 5\' 9"  (1.753 m), weight 105.2 kg, SpO2 100 %. Physical Exam: General: pleasant, WD/WN white male who is laying in bed in NAD HEENT: head is normocephalic, atraumatic.  Sclera are noninjected.  PERRL.  Ears and nose without any masses or lesions.  Mouth is pink and moist. Dentition fair Heart: regular, rate, and rhythm.  Normal s1,s2. No obvious murmurs, gallops, or rubs noted.  Palpable pedal pulses bilaterally  Lungs: CTAB, no wheezes, rhonchi, or rales noted.  Respiratory effort nonlabored Abd: soft, mild distension, +BS, no masses, hernias, or organomegaly, mild diffuse TTP without rebound or guarding MS: no BUE/BLE edema, calves soft and nontender Skin: warm and dry with no masses, lesions, or rashes Psych: A&Ox4 with an appropriate affect Neuro: cranial nerves grossly intact, equal strength in BUE/BLE bilaterally, normal speech, thought process intact  Results for orders placed or performed during the hospital encounter of 04/06/20 (from the past 48 hour(s))  CBC with Differential     Status: Abnormal   Collection Time: 04/06/20  7:06 PM  Result Value Ref Range   WBC 16.3 (H) 4.0 - 10.5 K/uL   RBC 6.55 (H) 4.22 - 5.81 MIL/uL   Hemoglobin 17.0 13.0 - 17.0 g/dL   HCT 06/06/20 (H) 39 - 52 %   MCV 85.0 80.0 - 100.0 fL   MCH 26.0 26.0 - 34.0 pg   MCHC 30.5 30.0 - 36.0 g/dL   RDW 78.2 95.6 - 21.3 %   Platelets 427 (H) 150 - 400 K/uL   nRBC 0.0 0.0 - 0.2 %   Neutrophils Relative % 87 %   Neutro Abs 14.1 (H) 1.7 - 7.7 K/uL   Lymphocytes Relative 6 %   Lymphs Abs 1.0 0.7 - 4.0 K/uL    Monocytes Relative 7 %   Monocytes Absolute 1.2 (H) 0 - 1 K/uL   Eosinophils Relative 0 %   Eosinophils Absolute 0.0 0 - 0 K/uL   Basophils Relative 0 %   Basophils Absolute 0.0 0 - 0 K/uL   Immature Granulocytes 0 %   Abs Immature Granulocytes 0.06 0.00 - 0.07 K/uL    Comment: Performed at [facility], [address] [place], [place]  Comprehensive metabolic panel     Status: Abnormal   Collection Time: 04/06/20  7:06 PM  Result Value Ref Range   Sodium 137 135 - 145 mmol/L   Potassium 3.7 3.5 - 5.1 mmol/L  Chloride 97 (L) 98 - 111 mmol/L   CO2 26 22 - 32 mmol/L   Glucose, Bld 221 (H) 70 - 99 mg/dL    Comment: Glucose reference range applies only to samples taken after fasting for at least 8 hours.   BUN 14 8 - 23 mg/dL   Creatinine, Ser 0.08 0.61 - 1.24 mg/dL   Calcium 9.0 8.9 - 67.6 mg/dL   Total Protein 8.5 (H) 6.5 - 8.1 g/dL   Albumin 4.9 3.5 - 5.0 g/dL   AST 28 15 - 41 U/L   ALT 22 0 - 44 U/L   Alkaline Phosphatase 94 38 - 126 U/L   Total Bilirubin 1.1 0.3 - 1.2 mg/dL   GFR calc non Af Amer >60 >60 mL/min   GFR calc Af Amer >60 >60 mL/min   Anion gap 14 5 - 15    Comment: Performed at [facility], [address] [place], [place] [address]  Urinalysis, Routine w reflex microscopic Urine, Clean Catch     Status: Abnormal   Collection Time: 04/06/20  7:06 PM  Result Value Ref Range   Color, Urine YELLOW YELLOW   APPearance CLEAR CLEAR   Specific Gravity, Urine >1.030 (H) 1.005 - 1.030   pH 6.0 5.0 - 8.0   Glucose, UA 100 (A) NEGATIVE mg/dL   Hgb urine dipstick NEGATIVE NEGATIVE   Bilirubin Urine SMALL (A) NEGATIVE   Ketones, ur NEGATIVE NEGATIVE mg/dL   Protein, ur >326 (A) NEGATIVE mg/dL   Nitrite NEGATIVE NEGATIVE   Leukocytes,Ua NEGATIVE NEGATIVE    Comment: Performed at [facility], [address] [place], [place] [address]  Lipase, blood     Status: None   Collection Time: 04/06/20  7:06 PM  Result Value Ref  Range   Lipase 37 11 - 51 U/L    Comment: Performed at [facility], [address] [place], [place] [address]  Urinalysis, Microscopic (reflex)     Status: Abnormal   Collection Time: 04/06/20  7:06 PM  Result Value Ref Range   RBC / HPF 0-5 0 - 5 RBC/hpf   WBC, UA NONE SEEN 0 - 5 WBC/hpf   Bacteria, UA RARE (A) NONE SEEN   Squamous Epithelial / LPF 0-5 0 - 5   Mucus PRESENT     Comment: Performed at [facility], [address] [place], [place] [address]  SARS Coronavirus 2 by RT PCR (hospital order, performed in [facility] hospital lab) Nasopharyngeal Nasopharyngeal Swab     Status: None   Collection Time: 04/06/20  9:38 PM   Specimen: Nasopharyngeal Swab  Result Value Ref Range   SARS Coronavirus 2 NEGATIVE NEGATIVE    Comment: (NOTE) SARS-CoV-2 target nucleic acids are NOT DETECTED.  The SARS-CoV-2 RNA is generally detectable in upper and lower respiratory specimens during the acute phase of infection. The lowest concentration of SARS-CoV-2 viral copies this assay can detect is 250 copies / mL. A negative result does not preclude SARS-CoV-2 infection and should not be used as the sole basis for treatment or other patient management decisions.  A negative result may occur with improper specimen collection / handling, submission of specimen other than nasopharyngeal swab, presence of viral mutation(s) within the areas targeted by this assay, and inadequate number of viral copies (<250 copies / mL). A negative result must be combined with clinical observations, patient history, and epidemiological information.  Fact Sheet for Patients:  BoilerBrush.com.cyhttps://www.fda.gov/media/136312/download  Fact Sheet for Healthcare Providers: https://pope.com/https://www.fda.gov/media/136313/download  This test is not yet approved or  cleared by the [place] FDA and has been authorized for detection and/or diagnosis of SARS-CoV-2 by FDA under an Emergency Use Authorization (EUA).  This  EUA will remain in effect (meaning this test can be used) for the duration of the COVID-19 declaration under Section 564(b)(1) of the Act, 21 U.S.C. section 360bbb-3(b)(1), unless the authorization is terminated or revoked sooner.  Performed at [facility], [address] [place], [place] [address]   Glucose, capillary     Status: Abnormal   Collection Time: 04/07/20  6:09 AM  Result Value Ref Range   Glucose-Capillary 184 (H) 70 - 99 mg/dL    Comment: Glucose reference range applies only to samples taken after fasting for at least 8 hours.  Glucose, capillary     Status: Abnormal   Collection Time: 04/07/20  7:46 AM  Result Value Ref Range   Glucose-Capillary 197 (H) 70 - 99 mg/dL    Comment: Glucose reference range applies only to samples taken after fasting for at least 8 hours.  Hemoglobin A1c     Status: Abnormal   Collection Time: 04/07/20  7:52 AM  Result Value Ref Range   Hgb A1c MFr Bld 7.2 (H) 4.8 - 5.6 %    Comment: (NOTE) Pre diabetes:          5.7%-6.4%  Diabetes:              >6.4%  Glycemic control for   <7.0% adults with diabetes    Mean Plasma Glucose 159.94 mg/dL    Comment: Performed at [facility], [address] [place], [place] [address]  CBC     Status: Abnormal   Collection Time: 04/07/20  7:52 AM  Result Value Ref Range   WBC 9.1 4.0 - 10.5 K/uL   RBC 6.12 (H) 4.22 - 5.81 MIL/uL   Hemoglobin 16.1 13.0 - 17.0 g/dL   HCT 09.852.3 (H) 39 - 52 %   MCV 85.5 80.0 - 100.0 fL   MCH 26.3 26.0 - 34.0 pg   MCHC 30.8 30.0 - 36.0 g/dL   RDW 11.914.6 14.711.5 - 82.915.5 %   Platelets 362 150 - 400 K/uL   nRBC 0.0 0.0 - 0.2 %    Comment: Performed at [facility], [address] [place], [place] [address]  Creatinine, serum     Status: None   Collection Time: 04/07/20  7:52 AM  Result Value Ref Range   Creatinine, Ser 0.89 0.61 - 1.24 mg/dL   GFR calc non Af Amer >60 >60 mL/min   GFR calc Af Amer >60 >60 mL/min    Comment:  Performed at [facility], [address] [place], [place] [address]   DG Abdomen 1 View  Result Date: 04/06/2020 CLINICAL DATA:  NG tube placement EXAM: ABDOMEN - 1 VIEW COMPARISON:  04/06/2020 FINDINGS: Ascending thoracic stimulator leads. Esophageal tube tip and side port overlie the proximal stomach. Partially visualized dilated small bowel consistent with obstruction. IMPRESSION: Esophageal tube tip overlies the proximal stomach. Electronically Signed   By: Jasmine PangKim  Fujinaga M.D.   On: 04/06/2020 23:43   CT ABDOMEN PELVIS W CONTRAST  Result Date: 04/06/2020 CLINICAL DATA:  Abdominal pain, nausea, vomiting, diarrhea EXAM: CT ABDOMEN AND PELVIS WITH CONTRAST TECHNIQUE: Multidetector CT imaging of the abdomen and pelvis was performed using the standard protocol following bolus administration of  intravenous contrast. CONTRAST:  OMNIPAQUE IOHEXOL 300 MG/ML  SOLN COMPARISON:  None. FINDINGS: Lower chest: Moderate right coronary artery calcification. Global cardiac size within normal limits. No pericardial effusion. Visualized lung bases are clear bilaterally. Hepatobiliary: Mild hepatic steatosis. Liver otherwise unremarkable. Status post cholecystectomy. No intra or extrahepatic biliary ductal dilation. Pancreas: Unremarkable Spleen: Unremarkable Adrenals/Urinary Tract: The adrenal glands are unremarkable. The kidneys are normal in size and position. Simple cortical cyst noted within the upper pole of the right kidney. The kidneys are otherwise unremarkable. The bladder is unremarkable. Stomach/Bowel: A developing mid small bowel obstruction is identified with a point of transition seen within the right mid abdomen best noted on axial image # 42, sagittal image # 44 and coronal image # 17. Mild tethering of the small bowel in this location suggests that this may relate to an underlying adhesion. The obstruction appears incomplete at this point as the a small amount of gas and fluid is  seen downstream from this point. The stomach is unremarkable. Mild sigmoid and descending colonic diverticulosis. The large bowel is otherwise unremarkable. Appendix normal. No free intraperitoneal gas or fluid. Vascular/Lymphatic: The abdominal vasculature is age-appropriate with mild aortoiliac atherosclerotic calcification. No aneurysm. No pathologic adenopathy within the abdomen and pelvis. Reproductive: Prostate is unremarkable. Penile prosthesis is in place with the reservoir noted within the left inferior rectus sheath. Other: Tiny right spigelian hernia is seen with herniation of a small amount of mesenteric fat. Musculoskeletal: L4-5 lumbar fusion with instrumentation has been performed. Dorsal column stimulator leads enter the spinal canal at L1-2 with the power pack placed within the subcutaneous fat of the left gluteal region. No acute bone abnormality. IMPRESSION: 1. High-grade partial or developing complete mid small bowel obstruction with a point of transition seen within the right mid abdomen. 2. Mild hepatic steatosis. Aortic Atherosclerosis (ICD10-I70.0). Electronically Signed   By: Helyn Numbers MD   On: 04/06/2020 22:31   DG Abd Portable 1V-Small Bowel Protocol-Position Verification  Result Date: 04/07/2020 CLINICAL DATA:  Check gastric catheter placement EXAM: PORTABLE ABDOMEN - 1 VIEW COMPARISON:  04/06/2020 FINDINGS: Gastric catheter is again noted within the stomach directed towards the antrum. Spinal stimulator is seen. No free air is noted. No obstructive changes are noted. IMPRESSION: Stable gastric catheter within the stomach. Electronically Signed   By: Alcide Clever M.D.   On: 04/07/2020 08:35    Anti-infectives (From admission, onward)   None       Assessment/Plan HTN DM HLD  SBO - PSH: right inguinal hernia repair x2, umbilical hernia repair, laparoscopic cholecystectomy 08/2019 >> all surgeries done in [place] - CT 8/12 shows shows high-grade partial or developing  complete mid small bowel obstruction with a point of transition seen within the right mid abdomen  ID - none VTE - SCDs, lovenox FEN - IVF, NPO/NGT to LIWS Foley - none Follow up - TBD  Plan - Agree with NG tube for decompression. Small bowel protocol started today. Follow up delayed abdominal film. We will follow.  Franne Forts, PA-C [facility] Surgery 04/07/2020, 10:25 AM Please see Amion for pager number during day hours 7:00am-4:30pm

## 2020-04-08 ENCOUNTER — Inpatient Hospital Stay (HOSPITAL_COMMUNITY): Payer: Medicare Other

## 2020-04-08 DIAGNOSIS — Z0189 Encounter for other specified special examinations: Secondary | ICD-10-CM

## 2020-04-08 LAB — BASIC METABOLIC PANEL
Anion gap: 8 (ref 5–15)
BUN: 13 mg/dL (ref 8–23)
CO2: 26 mmol/L (ref 22–32)
Calcium: 7.3 mg/dL — ABNORMAL LOW (ref 8.9–10.3)
Chloride: 98 mmol/L (ref 98–111)
Creatinine, Ser: 0.92 mg/dL (ref 0.61–1.24)
GFR calc Af Amer: >60 mL/min (ref >60)
GFR calc non Af Amer: >60 mL/min (ref >60)
Glucose, Bld: 116 mg/dL — ABNORMAL HIGH (ref 70–99)
Potassium: 3.3 mmol/L — ABNORMAL LOW (ref 3.5–5.1)
Sodium: 132 mmol/L — ABNORMAL LOW (ref 135–145)

## 2020-04-08 LAB — GLUCOSE, CAPILLARY
Glucose-Capillary: 114 mg/dL — ABNORMAL HIGH (ref 70–99)
Glucose-Capillary: 120 mg/dL — ABNORMAL HIGH (ref 70–99)
Glucose-Capillary: 139 mg/dL — ABNORMAL HIGH (ref 70–99)
Glucose-Capillary: 145 mg/dL — ABNORMAL HIGH (ref 70–99)
Glucose-Capillary: 160 mg/dL — ABNORMAL HIGH (ref 70–99)
Glucose-Capillary: 201 mg/dL — ABNORMAL HIGH (ref 70–99)

## 2020-04-08 LAB — CBC
HCT: 46.1 % (ref 39.0–52.0)
Hemoglobin: 13.9 g/dL (ref 13.0–17.0)
MCH: 25.9 pg — ABNORMAL LOW (ref 26.0–34.0)
MCHC: 30.2 g/dL (ref 30.0–36.0)
MCV: 86 fL (ref 80.0–100.0)
Platelets: 310 10*3/uL (ref 150–400)
RBC: 5.36 MIL/uL (ref 4.22–5.81)
RDW: 14.6 % (ref 11.5–15.5)
WBC: 6.1 10*3/uL (ref 4.0–10.5)
nRBC: 0 % (ref 0.0–0.2)

## 2020-04-08 LAB — PROTIME-INR
INR: 1.1 (ref 0.8–1.2)
Prothrombin Time: 14.2 seconds (ref 11.4–15.2)

## 2020-04-08 MED ORDER — KCL IN DEXTROSE-NACL 40-5-0.45 MEQ/L-%-% IV SOLN
INTRAVENOUS | Status: AC
  Filled 2020-04-08 (×5): qty 1000

## 2020-04-08 MED ORDER — HYDROMORPHONE HCL 1 MG/ML IJ SOLN
0.5000 mg | INTRAMUSCULAR | Status: DC | PRN
  Administered 2020-04-08 – 2020-04-10 (×5): 0.5 mg via INTRAVENOUS
  Filled 2020-04-08 (×5): qty 0.5

## 2020-04-08 MED ORDER — FAMOTIDINE 40 MG/5ML PO SUSR
20.0000 mg | Freq: Once | ORAL | Status: AC
  Administered 2020-04-08: 20 mg via ORAL
  Filled 2020-04-08: qty 2.5

## 2020-04-08 MED ORDER — ALUM & MAG HYDROXIDE-SIMETH 200-200-20 MG/5ML PO SUSP
30.0000 mL | ORAL | Status: DC | PRN
  Administered 2020-04-08: 30 mL via ORAL
  Filled 2020-04-08: qty 30

## 2020-04-08 NOTE — Progress Notes (Signed)
Subjective/Chief Complaint: Was doing well until the past hour.  Now having increased bloating and nausea. Still passing flatus   Objective: Vital signs in last 24 hours: Temp:  [98.3 F (36.8 C)-98.5 F (36.9 C)] 98.5 F (36.9 C) (08/14 0604) Pulse Rate:  [97-101] 97 (08/14 0604) Resp:  [16-18] 16 (08/14 0604) BP: (114-144)/(77-85) 143/85 (08/14 0604) SpO2:  [96 %] 96 % (08/14 0604) Last BM Date: 04/07/20  Intake/Output from previous day: 08/13 0701 - 08/14 0700 In: 2480 [P.O.:480; I.V.:2000] Out: 102 [Urine:1; Emesis/NG output:100; Stool:1] Intake/Output this shift: No intake/output data recorded.  Exam: Awake and alert Abdomen distended, minimally tender  Lab Results:  Recent Labs    04/07/20 0752 04/08/20 0452  WBC 9.1 6.1  HGB 16.1 13.9  HCT 52.3* 46.1  PLT 362 310   BMET Recent Labs    04/06/20 1906 04/06/20 1906 04/07/20 0752 04/08/20 0452  NA 137  --   --  132*  K 3.7  --   --  3.3*  CL 97*  --   --  98  CO2 26  --   --  26  GLUCOSE 221*  --   --  116*  BUN 14  --   --  13  CREATININE 1.10   < > 0.89 0.92  CALCIUM 9.0  --   --  7.3*   < > = values in this interval not displayed.   PT/INR Recent Labs    04/08/20 0452  LABPROT 14.2  INR 1.1   ABG No results for input(s): PHART, HCO3 in the last 72 hours.  Invalid input(s): PCO2, PO2  Studies/Results: DG Abdomen 1 View  Result Date: 04/06/2020 CLINICAL DATA:  NG tube placement EXAM: ABDOMEN - 1 VIEW COMPARISON:  04/06/2020 FINDINGS: Ascending thoracic stimulator leads. Esophageal tube tip and side port overlie the proximal stomach. Partially visualized dilated small bowel consistent with obstruction. IMPRESSION: Esophageal tube tip overlies the proximal stomach. Electronically Signed   By: Jasmine Pang M.D.   On: 04/06/2020 23:43   CT ABDOMEN PELVIS W CONTRAST  Result Date: 04/06/2020 CLINICAL DATA:  Abdominal pain, nausea, vomiting, diarrhea EXAM: CT ABDOMEN AND PELVIS WITH  CONTRAST TECHNIQUE: Multidetector CT imaging of the abdomen and pelvis was performed using the standard protocol following bolus administration of intravenous contrast. CONTRAST:  OMNIPAQUE IOHEXOL 300 MG/ML  SOLN COMPARISON:  None. FINDINGS: Lower chest: Moderate right coronary artery calcification. Global cardiac size within normal limits. No pericardial effusion. Visualized lung bases are clear bilaterally. Hepatobiliary: Mild hepatic steatosis. Liver otherwise unremarkable. Status post cholecystectomy. No intra or extrahepatic biliary ductal dilation. Pancreas: Unremarkable Spleen: Unremarkable Adrenals/Urinary Tract: The adrenal glands are unremarkable. The kidneys are normal in size and position. Simple cortical cyst noted within the upper pole of the right kidney. The kidneys are otherwise unremarkable. The bladder is unremarkable. Stomach/Bowel: A developing mid small bowel obstruction is identified with a point of transition seen within the right mid abdomen best noted on axial image # 42, sagittal image # 44 and coronal image # 17. Mild tethering of the small bowel in this location suggests that this may relate to an underlying adhesion. The obstruction appears incomplete at this point as the a small amount of gas and fluid is seen downstream from this point. The stomach is unremarkable. Mild sigmoid and descending colonic diverticulosis. The large bowel is otherwise unremarkable. Appendix normal. No free intraperitoneal gas or fluid. Vascular/Lymphatic: The abdominal vasculature is age-appropriate with mild aortoiliac atherosclerotic calcification.  No aneurysm. No pathologic adenopathy within the abdomen and pelvis. Reproductive: Prostate is unremarkable. Penile prosthesis is in place with the reservoir noted within the left inferior rectus sheath. Other: Tiny right spigelian hernia is seen with herniation of a small amount of mesenteric fat. Musculoskeletal: L4-5 lumbar fusion with instrumentation  has been performed. Dorsal column stimulator leads enter the spinal canal at L1-2 with the power pack placed within the subcutaneous fat of the left gluteal region. No acute bone abnormality. IMPRESSION: 1. High-grade partial or developing complete mid small bowel obstruction with a point of transition seen within the right mid abdomen. 2. Mild hepatic steatosis. Aortic Atherosclerosis (ICD10-I70.0). Electronically Signed   By: Helyn Numbers MD   On: 04/06/2020 22:31   DG Abd Portable 1V-Small Bowel Obstruction Protocol-initial, 8 hr delay  Result Date: 04/07/2020 CLINICAL DATA:  Small-bowel obstruction. EXAM: PORTABLE ABDOMEN - 1 VIEW COMPARISON:  04/07/2020 abdominal radiographs and prior. FINDINGS: Stable positioning of enteric tube and spinal stimulator. Enteric contrast opacifies the large bowel to the level of the rectum. Gas is seen within nondilated small bowel loops. Sequela of lumbar spinal fixation. No acute osseous abnormality. IMPRESSION: No dilated bowel loops or air-fluid levels. Enteric contrast opacifies the large bowel to level the rectum. Indwelling enteric tube and spinal stimulator, unchanged. Electronically Signed   By: Stana Bunting M.D.   On: 04/07/2020 17:54   DG Abd Portable 1V-Small Bowel Protocol-Position Verification  Result Date: 04/07/2020 CLINICAL DATA:  Check gastric catheter placement EXAM: PORTABLE ABDOMEN - 1 VIEW COMPARISON:  04/06/2020 FINDINGS: Gastric catheter is again noted within the stomach directed towards the antrum. Spinal stimulator is seen. No free air is noted. No obstructive changes are noted. IMPRESSION: Stable gastric catheter within the stomach. Electronically Signed   By: Alcide Clever M.D.   On: 04/07/2020 08:35    Anti-infectives: Anti-infectives (From admission, onward)   None      Assessment/Plan: Partial SBO  Will check abdominal xrays May need NG back in  LOS: 1 day    [name] 04/08/2020

## 2020-04-08 NOTE — Progress Notes (Signed)
PROGRESS NOTE    Brandon [name]  WUJ:[id number] DOB: 1956/06/10 DOA: 04/06/2020 PCP: Patient, No Pcp Per    Brief Narrative: 64 y.o. male past medical history of with a recent history of gangrenous cholecystitis back in January where he spent 10 days in the hospital in [place] where he used to live, diabetes mellitus and hypertension comes in complaining of abdominal pain and vomiting that started the day prior to admission.  He stated his abdomen was more more distended he started passing gas and having some loose stools.  [facility]: To be tachycardic and hypoxic, white blood cell count of 16.3 chest CT showed small bowel obstruction, NG tube was placed per hour 1000 L, was started on IV fluids and [facility].   Assessment & Plan:   Active Problems:   SBO (small bowel obstruction) (HCC)   Essential hypertension   Diabetes mellitus type 2, controlled, without complications (HCC)  #1 small bowel obstruction-NG tube was taken out overnight when I saw him earlier today he was feeling better with no nausea anxious to eat later on he became more nauseous with increasing abdominal distention having flatus.  KUB ordered.  Surgery  Following. Follow-up KUB today and in a.m.  Patient with status post recent gangrenous cholecystitis and cholecystectomy. #2 history of essential hypertension blood pressure 143/85 on Norvasc 10 mg daily at home patient has been on hold.  Continue as needed hydralazine.  #3 type 2 diabetes -he has been drinking juices clear liquids so far.  Continue current dose of insulin monitor closely.  On Actos and [place] Tresiba and Humalog insulin at home. CBG (last 3)  Recent Labs    04/08/20 0003 04/08/20 0416 04/08/20 0736  GLUCAP 139* 114* 145*   #4 BPH on Flomax at home  #5 hyperlipidemia on lovastatin at home  Estimated body mass index is 34.26 kg/m as calculated from the following:   Height as of this encounter: 5\' 9"  (1.753 m).    Weight as of this encounter: 105.2 kg.  DVT prophylaxis: Lovenox  code Status: Full code Family Communication: None at bedside  disposition Plan:  Status is: Inpatient  Dispo: The patient is from: Home               Anticipated d/c is to: Home              Anticipated d/c date is unknown              Patient currently is not medically stable to d/c.  Patient admitted with small bowel obstruction  Consultants: General surgery  Procedures: NG tube placed 04/07/2020 discontinued overnight may need to restart again depending on what KUB shows today Antimicrobials:  None  subjective:  Having flatus abdomen remains distended has been drinking clear liquids Objective: Vitals:   04/07/20 0912 04/07/20 1338 04/07/20 2158 04/08/20 0604  BP: (!) 143/75 (!) 144/77 114/80 (!) 143/85  Pulse: 97 (!) 101 98 97  Resp: 18 18 18 16   Temp: 98.4 F (36.9 C) 98.3 F (36.8 C) 98.5 F (36.9 C) 98.5 F (36.9 C)  TempSrc: Oral Oral Oral Oral  SpO2: 100% 96% 96% 96%  Weight:      Height:        Intake/Output Summary (Last 24 hours) at 04/08/2020 1113 Last data filed at 04/08/2020 [id number] Gross per 24 hour  Intake 2080 ml  Output 102 ml  Net 1978 ml   Filed Weights   04/06/20 1900  Weight: 105.2 kg    Examination:  General exam: Appears calm and comfortable  Respiratory system: Clear to auscultation. Respiratory effort normal. Cardiovascular system: S1 & S2 heard, RRR. No JVD, murmurs, rubs, gallops or clicks. No pedal edema. Gastrointestinal system: Abdomen is nondistended, soft and nontender. No organomegaly or masses felt.  Diminished bowel sounds heard. Central nervous system: Alert and oriented. No focal neurological deficits. Extremities: Symmetric 5 x 5 power. Skin: No rashes, lesions or ulcers Psychiatry: Judgement and insight appear normal. Mood & affect appropriate.     Data Reviewed: I have personally reviewed following labs and imaging studies  CBC: Recent Labs  Lab  04/06/20 1906 04/07/20 0752 04/08/20 0452  WBC 16.3* 9.1 6.1  NEUTROABS 14.1*  --   --   HGB 17.0 16.1 13.9  HCT 55.7* 52.3* 46.1  MCV 85.0 85.5 86.0  PLT 427* 362 310   Basic Metabolic Panel: Recent Labs  Lab 04/06/20 1906 04/07/20 0752 04/08/20 0452  NA 137  --  132*  K 3.7  --  3.3*  CL 97*  --  98  CO2 26  --  26  GLUCOSE 221*  --  116*  BUN 14  --  13  CREATININE 1.10 0.89 0.92  CALCIUM 9.0  --  7.3*   GFR: Estimated Creatinine Clearance: 97 mL/min (by C-G formula based on SCr of 0.92 mg/dL). Liver Function Tests: Recent Labs  Lab 04/06/20 1906  AST 28  ALT 22  ALKPHOS 94  BILITOT 1.1  PROT 8.5*  ALBUMIN 4.9   Recent Labs  Lab 04/06/20 1906  LIPASE 37   No results for input(s): AMMONIA in the last 168 hours. Coagulation Profile: Recent Labs  Lab 04/08/20 0452  INR 1.1   Cardiac Enzymes: No results for input(s): CKTOTAL, CKMB, CKMBINDEX, TROPONINI in the last 168 hours. BNP (last 3 results) No results for input(s): PROBNP in the last 8760 hours. HbA1C: Recent Labs    04/07/20 0752  HGBA1C 7.2*   CBG: Recent Labs  Lab 04/07/20 1631 04/07/20 2008 04/08/20 0003 04/08/20 0416 04/08/20 0736  GLUCAP 166* 284* 139* 114* 145*   Lipid Profile: No results for input(s): CHOL, HDL, LDLCALC, TRIG, CHOLHDL, LDLDIRECT in the last 72 hours. Thyroid Function Tests: No results for input(s): TSH, T4TOTAL, FREET4, T3FREE, THYROIDAB in the last 72 hours. Anemia Panel: No results for input(s): VITAMINB12, FOLATE, FERRITIN, TIBC, IRON, RETICCTPCT in the last 72 hours. Sepsis Labs: No results for input(s): PROCALCITON, LATICACIDVEN in the last 168 hours.  Recent Results (from the past 240 hour(s))  SARS Coronavirus 2 by RT PCR (hospital order, performed in [facility] hospital lab) Nasopharyngeal Nasopharyngeal Swab     Status: None   Collection Time: 04/06/20  9:38 PM   Specimen: Nasopharyngeal Swab  Result Value Ref Range Status   SARS Coronavirus 2  NEGATIVE NEGATIVE Final    Comment: (NOTE) SARS-CoV-2 target nucleic acids are NOT DETECTED.  The SARS-CoV-2 RNA is generally detectable in upper and lower respiratory specimens during the acute phase of infection. The lowest concentration of SARS-CoV-2 viral copies this assay can detect is 250 copies / mL. A negative result does not preclude SARS-CoV-2 infection and should not be used as the sole basis for treatment or other patient management decisions.  A negative result may occur with improper specimen collection / handling, submission of specimen other than nasopharyngeal swab, presence of viral mutation(s) within the areas targeted by this assay, and inadequate number of viral copies (<250 copies /  mL). A negative result must be combined with clinical observations, patient history, and epidemiological information.  Fact Sheet for Patients:   BoilerBrush.com.cy  Fact Sheet for Healthcare Providers: https://pope.com/  This test is not yet approved or  cleared by the [place] FDA and has been authorized for detection and/or diagnosis of SARS-CoV-2 by FDA under an Emergency Use Authorization (EUA).  This EUA will remain in effect (meaning this test can be used) for the duration of the COVID-19 declaration under Section 564(b)(1) of the Act, 21 U.S.C. section 360bbb-3(b)(1), unless the authorization is terminated or revoked sooner.  Performed at [facility], [address] [place], [place] [address]          Radiology Studies: DG Abdomen 1 View  Result Date: 04/06/2020 CLINICAL DATA:  NG tube placement EXAM: ABDOMEN - 1 VIEW COMPARISON:  04/06/2020 FINDINGS: Ascending thoracic stimulator leads. Esophageal tube tip and side port overlie the proximal stomach. Partially visualized dilated small bowel consistent with obstruction. IMPRESSION: Esophageal tube tip overlies the proximal stomach. Electronically Signed    By: Jasmine Pang M.D.   On: 04/06/2020 23:43   CT ABDOMEN PELVIS W CONTRAST  Result Date: 04/06/2020 CLINICAL DATA:  Abdominal pain, nausea, vomiting, diarrhea EXAM: CT ABDOMEN AND PELVIS WITH CONTRAST TECHNIQUE: Multidetector CT imaging of the abdomen and pelvis was performed using the standard protocol following bolus administration of intravenous contrast. CONTRAST:  OMNIPAQUE IOHEXOL 300 MG/ML  SOLN COMPARISON:  None. FINDINGS: Lower chest: Moderate right coronary artery calcification. Global cardiac size within normal limits. No pericardial effusion. Visualized lung bases are clear bilaterally. Hepatobiliary: Mild hepatic steatosis. Liver otherwise unremarkable. Status post cholecystectomy. No intra or extrahepatic biliary ductal dilation. Pancreas: Unremarkable Spleen: Unremarkable Adrenals/Urinary Tract: The adrenal glands are unremarkable. The kidneys are normal in size and position. Simple cortical cyst noted within the upper pole of the right kidney. The kidneys are otherwise unremarkable. The bladder is unremarkable. Stomach/Bowel: A developing mid small bowel obstruction is identified with a point of transition seen within the right mid abdomen best noted on axial image # 42, sagittal image # 44 and coronal image # 17. Mild tethering of the small bowel in this location suggests that this may relate to an underlying adhesion. The obstruction appears incomplete at this point as the a small amount of gas and fluid is seen downstream from this point. The stomach is unremarkable. Mild sigmoid and descending colonic diverticulosis. The large bowel is otherwise unremarkable. Appendix normal. No free intraperitoneal gas or fluid. Vascular/Lymphatic: The abdominal vasculature is age-appropriate with mild aortoiliac atherosclerotic calcification. No aneurysm. No pathologic adenopathy within the abdomen and pelvis. Reproductive: Prostate is unremarkable. Penile prosthesis is in place with the reservoir  noted within the left inferior rectus sheath. Other: Tiny right spigelian hernia is seen with herniation of a small amount of mesenteric fat. Musculoskeletal: L4-5 lumbar fusion with instrumentation has been performed. Dorsal column stimulator leads enter the spinal canal at L1-2 with the power pack placed within the subcutaneous fat of the left gluteal region. No acute bone abnormality. IMPRESSION: 1. High-grade partial or developing complete mid small bowel obstruction with a point of transition seen within the right mid abdomen. 2. Mild hepatic steatosis. Aortic Atherosclerosis (ICD10-I70.0). Electronically Signed   By: Helyn Numbers MD   On: 04/06/2020 22:31   DG Abd Portable 1V-Small Bowel Obstruction Protocol-initial, 8 hr delay  Result Date: 04/07/2020 CLINICAL DATA:  Small-bowel obstruction. EXAM: PORTABLE ABDOMEN - 1 VIEW COMPARISON:  04/07/2020 abdominal radiographs and prior. FINDINGS: Stable positioning of enteric tube and spinal stimulator. Enteric contrast opacifies the large bowel to the level of the rectum. Gas is seen within nondilated small bowel loops. Sequela of lumbar spinal fixation. No acute osseous abnormality. IMPRESSION: No dilated bowel loops or air-fluid levels. Enteric contrast opacifies the large bowel to level the rectum. Indwelling enteric tube and spinal stimulator, unchanged. Electronically Signed   By: Stana Bunting M.D.   On: 04/07/2020 17:54   DG Abd Portable 1V-Small Bowel Protocol-Position Verification  Result Date: 04/07/2020 CLINICAL DATA:  Check gastric catheter placement EXAM: PORTABLE ABDOMEN - 1 VIEW COMPARISON:  04/06/2020 FINDINGS: Gastric catheter is again noted within the stomach directed towards the antrum. Spinal stimulator is seen. No free air is noted. No obstructive changes are noted. IMPRESSION: Stable gastric catheter within the stomach. Electronically Signed   By: Alcide Clever M.D.   On: 04/07/2020 08:35        Scheduled Meds: .  enoxaparin (LOVENOX) injection  40 mg Subcutaneous Q24H  . insulin aspart  0-9 Units Subcutaneous Q4H  . insulin detemir  5 Units Subcutaneous BID  . mouth rinse  15 mL Mouth Rinse BID  . tamsulosin  0.4 mg Oral QPC breakfast   Continuous Infusions: . dextrose 5 % and 0.45 % NaCl with KCl 40 mEq/L 100 mL/hr at 04/07/20 1851     LOS: 1 day     Alwyn Ren, MD  04/08/2020, 11:13 AM

## 2020-04-09 ENCOUNTER — Inpatient Hospital Stay (HOSPITAL_COMMUNITY): Payer: Medicare Other

## 2020-04-09 LAB — GLUCOSE, CAPILLARY
Glucose-Capillary: 117 mg/dL — ABNORMAL HIGH (ref 70–99)
Glucose-Capillary: 143 mg/dL — ABNORMAL HIGH (ref 70–99)
Glucose-Capillary: 144 mg/dL — ABNORMAL HIGH (ref 70–99)
Glucose-Capillary: 153 mg/dL — ABNORMAL HIGH (ref 70–99)
Glucose-Capillary: 173 mg/dL — ABNORMAL HIGH (ref 70–99)
Glucose-Capillary: 176 mg/dL — ABNORMAL HIGH (ref 70–99)
Glucose-Capillary: 180 mg/dL — ABNORMAL HIGH (ref 70–99)

## 2020-04-09 MED ORDER — FAMOTIDINE IN NACL 20-0.9 MG/50ML-% IV SOLN
20.0000 mg | Freq: Two times a day (BID) | INTRAVENOUS | Status: DC
  Administered 2020-04-09 – 2020-04-11 (×5): 20 mg via INTRAVENOUS
  Filled 2020-04-09 (×5): qty 50

## 2020-04-09 NOTE — Progress Notes (Signed)
PROGRESS NOTE    Brandon [name]  OIB:[id number] DOB: September 01, 1955 DOA: 04/06/2020 PCP: Patient, No Pcp Per    Brief Narrative: 64 y.o. male past medical history of with a recent history of gangrenous cholecystitis back in January where he spent 10 days in the hospital in [place] where he used to live, diabetes mellitus and hypertension comes in complaining of abdominal pain and vomiting that started the day prior to admission.  He stated his abdomen was more more distended he started passing gas and having some loose stools.  [facility]: To be tachycardic and hypoxic, white blood cell count of 16.3 chest CT showed small bowel obstruction, NG tube was placed per hour 1000 L, was started on IV fluids and transferred to [facility].   Assessment & Plan:   Active Problems:   Small bowel obstruction (HCC)   Essential hypertension   Diabetes mellitus type 2, controlled, without complications (HCC)   Encounter for imaging study to confirm nasogastric (NG) tube placement  #1 small bowel obstruction-patient had NG tube in which was taken out once his symptoms improved.  However he started to have nausea abdominal distention again.  He wants to hold off on placing the NG tube.  I have encouraged him to walk ambulate as much as he can tolerate as much as possible.  Patient with status post recent gangrenous cholecystitis and cholecystectomy.  #2 history of essential hypertension blood pressure 131/78.  Continue as needed hydralazine.  Patient is on Norvasc at home.    #3 type 2 diabetes - CBG (last 3)  Recent Labs    04/09/20 0403 04/09/20 0729 04/09/20 1153  GLUCAP 173* 153* 117*    so far.  Continue current dose of insulin monitor closely.  On Actos and [place] and Humalog insulin at home.  #4 BPH on Flomax at home  #5 hyperlipidemia on lovastatin at home  Estimated body mass index is 34.26 kg/m as calculated from the following:   Height as of this encounter: 5\' 9"   (1.753 m).   Weight as of this encounter: 105.2 kg.  DVT prophylaxis: Lovenox  code Status: Full code Family Communication: None at bedside  disposition Plan:  Status is: Inpatient  Dispo: The patient is from: Home               Anticipated d/c is to: Home              Anticipated d/c date is unknown              Patient currently is not medically stable to d/c.  Patient admitted with small bowel obstruction  Consultants: General surgery  Procedures: NG tube placed 04/07/2020   Antimicrobials:  None  subjective:  Had some nausea and vomiting earlier today now feels better having flatus  Objective: Vitals:   04/08/20 0604 04/08/20 1352 04/08/20 2242 04/09/20 0613  BP: (!) 143/85 131/80 (!) 155/85 131/78  Pulse: 97 93 94 88  Resp: 16 16 14 14   Temp: 98.5 F (36.9 C) 98.6 F (37 C) 98.3 F (36.8 C) 98 F (36.7 C)  TempSrc: Oral Oral Oral Oral  SpO2: 96% 97% 92% 95%  Weight:      Height:        Intake/Output Summary (Last 24 hours) at 04/09/2020 1243 Last data filed at 04/09/2020 1000 Gross per 24 hour  Intake 2153.93 ml  Output --  Net 2153.93 ml   Filed Weights   04/06/20 1900  Weight:  105.2 kg    Examination:  General exam: Appears calm and comfortable  Respiratory system: Clear to auscultation. Respiratory effort normal. Cardiovascular system: S1 & S2 heard, RRR. No JVD, murmurs, rubs, gallops or clicks. No pedal edema. Gastrointestinal system: Abdomen is nondistended, soft and nontender. No organomegaly or masses felt.  High-pitched bowel sounds heard. Central nervous system: Alert and oriented. No focal neurological deficits. Extremities: Symmetric 5 x 5 power. Skin: No rashes, lesions or ulcers Psychiatry: Judgement and insight appear normal. Mood & affect appropriate.     Data Reviewed: I have personally reviewed following labs and imaging studies  CBC: Recent Labs  Lab 04/06/20 1906 04/07/20 0752 04/08/20 0452  WBC 16.3* 9.1 6.1  NEUTROABS  14.1*  --   --   HGB 17.0 16.1 13.9  HCT 55.7* 52.3* 46.1  MCV 85.0 85.5 86.0  PLT 427* 362 310   Basic Metabolic Panel: Recent Labs  Lab 04/06/20 1906 04/07/20 0752 04/08/20 0452  NA 137  --  132*  K 3.7  --  3.3*  CL 97*  --  98  CO2 26  --  26  GLUCOSE 221*  --  116*  BUN 14  --  13  CREATININE 1.10 0.89 0.92  CALCIUM 9.0  --  7.3*   GFR: Estimated Creatinine Clearance: 97 mL/min (by C-G formula based on SCr of 0.92 mg/dL). Liver Function Tests: Recent Labs  Lab 04/06/20 1906  AST 28  ALT 22  ALKPHOS 94  BILITOT 1.1  PROT 8.5*  ALBUMIN 4.9   Recent Labs  Lab 04/06/20 1906  LIPASE 37   No results for input(s): AMMONIA in the last 168 hours. Coagulation Profile: Recent Labs  Lab 04/08/20 0452  INR 1.1   Cardiac Enzymes: No results for input(s): CKTOTAL, CKMB, CKMBINDEX, TROPONINI in the last 168 hours. BNP (last 3 results) No results for input(s): PROBNP in the last 8760 hours. HbA1C: Recent Labs    04/07/20 0752  HGBA1C 7.2*   CBG: Recent Labs  Lab 04/09/20 0009 04/09/20 0223 04/09/20 0403 04/09/20 0729 04/09/20 1153  GLUCAP 176* 180* 173* 153* 117*   Lipid Profile: No results for input(s): CHOL, HDL, LDLCALC, TRIG, CHOLHDL, LDLDIRECT in the last 72 hours. Thyroid Function Tests: No results for input(s): TSH, T4TOTAL, FREET4, T3FREE, THYROIDAB in the last 72 hours. Anemia Panel: No results for input(s): VITAMINB12, FOLATE, FERRITIN, TIBC, IRON, RETICCTPCT in the last 72 hours. Sepsis Labs: No results for input(s): PROCALCITON, LATICACIDVEN in the last 168 hours.  Recent Results (from the past 240 hour(s))  SARS Coronavirus 2 by RT PCR (hospital order, [facility]) Nasopharyngeal Nasopharyngeal Swab     Status: None   Collection Time: 04/06/20  9:38 PM   Specimen: Nasopharyngeal Swab  Result Value Ref Range Status   SARS Coronavirus 2 NEGATIVE NEGATIVE Final    Comment: (NOTE) SARS-CoV-2 target nucleic acids  are NOT DETECTED.  The SARS-CoV-2 RNA is generally detectable in upper and lower respiratory specimens during the acute phase of infection. The lowest concentration of SARS-CoV-2 viral copies this assay can detect is 250 copies / mL. A negative result does not preclude SARS-CoV-2 infection and should not be used as the sole basis for treatment or other patient management decisions.  A negative result may occur with improper specimen collection / handling, submission of specimen other than nasopharyngeal swab, presence of viral mutation(s) within the areas targeted by this assay, and inadequate number of viral copies (<250 copies /  mL). A negative result must be combined with clinical observations, patient history, and epidemiological information.  Fact Sheet for Patients:   BoilerBrush.com.cy  Fact Sheet for Healthcare Providers: https://pope.com/  This test is not yet approved or  cleared by the [place] FDA and has been authorized for detection and/or diagnosis of SARS-CoV-2 by FDA under an Emergency Use Authorization (EUA).  This EUA will remain in effect (meaning this test can be used) for the duration of the COVID-19 declaration under Section 564(b)(1) of the Act, 21 U.S.C. section 360bbb-3(b)(1), unless the authorization is terminated or revoked sooner.  Performed at [facility], [address], [place], [place] [address]          Radiology Studies: DG Chest 1 View  Result Date: 04/08/2020 CLINICAL DATA:  Hypoxemia.  Recent small-bowel obstruction EXAM: CHEST  1 VIEW COMPARISON:  None. FINDINGS: Linear opacities at the left base seen on recent abdominal CT and attributed to scarring. There is no edema, consolidation, effusion, or pneumothorax. Normal heart size and mediastinal contours. Dorsal column stimulator leads. IMPRESSION: 1. No acute finding. 2. Scarring at the left lung base. Electronically Signed    By: Marnee Spring M.D.   On: 04/08/2020 15:19   DG Abd 1 View  Result Date: 04/09/2020 CLINICAL DATA:  Abdominal distension EXAM: ABDOMEN - 1 VIEW COMPARISON:  04/08/2020 FINDINGS: Multiple dilated loops of small bowel are again identified and relatively stable. No free air is seen. No mass lesion is noted. Postsurgical changes are identified. IMPRESSION: Stable dilated small bowel loops consistent with at least partial small bowel obstruction. Electronically Signed   By: Alcide Clever M.D.   On: 04/09/2020 04:41   DG Abd Portable 1V  Result Date: 04/08/2020 CLINICAL DATA:  Increased bloating with nausea and diarrhea. EXAM: PORTABLE ABDOMEN - 1 VIEW COMPARISON:  04/07/2020 FINDINGS: Interval removal of nasogastric tube. Air is present within the large and small bowel. There are several dilated air-filled small bowel loops in the mid to lower central abdomen measuring up to 4.8 cm in diameter. No free peritoneal air. Remainder of the exam is unchanged. IMPRESSION: Several dilated air-filled small bowel loops in the mid to lower abdomen with air present in the large and small bowel. Findings may be due to ileus versus early/partial small bowel obstruction. Electronically Signed   By: Elberta Fortis M.D.   On: 04/08/2020 11:45   DG Abd Portable 1V-Small Bowel Obstruction Protocol-initial, 8 hr delay  Result Date: 04/07/2020 CLINICAL DATA:  Small-bowel obstruction. EXAM: PORTABLE ABDOMEN - 1 VIEW COMPARISON:  04/07/2020 abdominal radiographs and prior. FINDINGS: Stable positioning of enteric tube and spinal stimulator. Enteric contrast opacifies the large bowel to the level of the rectum. Gas is seen within nondilated small bowel loops. Sequela of lumbar spinal fixation. No acute osseous abnormality. IMPRESSION: No dilated bowel loops or air-fluid levels. Enteric contrast opacifies the large bowel to level the rectum. Indwelling enteric tube and spinal stimulator, unchanged. Electronically Signed   By:  Stana Bunting M.D.   On: 04/07/2020 17:54        Scheduled Meds: . enoxaparin (LOVENOX) injection  40 mg Subcutaneous Q24H  . insulin aspart  0-9 Units Subcutaneous Q4H  . insulin detemir  5 Units Subcutaneous BID  . mouth rinse  15 mL Mouth Rinse BID  . tamsulosin  0.4 mg Oral QPC breakfast   Continuous Infusions: . dextrose 5 % and 0.45 % NaCl with KCl 40 mEq/L 100 mL/hr at 04/09/20 0600  LOS: 2 days     Alwyn Ren, MD  04/09/2020, 12:43 PM

## 2020-04-09 NOTE — Progress Notes (Signed)
Subjective/Chief Complaint: Had more emesis early this morning Feeling much better Denies abdominal pain Passing flatus   Objective: Vital signs in last 24 hours: Temp:  [98 F (36.7 C)-98.6 F (37 C)] 98 F (36.7 C) (08/15 2130) Pulse Rate:  [88-94] 88 (08/15 0613) Resp:  [14-16] 14 (08/15 0613) BP: (131-155)/(78-85) 131/78 (08/15 0613) SpO2:  [92 %-97 %] 95 % (08/15 0613) Last BM Date: 04/07/20  Intake/Output from previous day: 08/14 0701 - 08/15 0700 In: 2330.6 [P.O.:240; I.V.:2090.6] Out: -  Intake/Output this shift: No intake/output data recorded.  Exam: Awake and alert Abdomen still distended, non-tender  Lab Results:  Recent Labs    04/07/20 0752 04/08/20 0452  WBC 9.1 6.1  HGB 16.1 13.9  HCT 52.3* 46.1  PLT 362 310   BMET Recent Labs    04/06/20 1906 04/06/20 1906 04/07/20 0752 04/08/20 0452  NA 137  --   --  132*  K 3.7  --   --  3.3*  CL 97*  --   --  98  CO2 26  --   --  26  GLUCOSE 221*  --   --  116*  BUN 14  --   --  13  CREATININE 1.10   < > 0.89 0.92  CALCIUM 9.0  --   --  7.3*   < > = values in this interval not displayed.   PT/INR Recent Labs    04/08/20 0452  LABPROT 14.2  INR 1.1   ABG No results for input(s): PHART, HCO3 in the last 72 hours.  Invalid input(s): PCO2, PO2  Studies/Results: DG Chest 1 View  Result Date: 04/08/2020 CLINICAL DATA:  Hypoxemia.  Recent small-bowel obstruction EXAM: CHEST  1 VIEW COMPARISON:  None. FINDINGS: Linear opacities at the left base seen on recent abdominal CT and attributed to scarring. There is no edema, consolidation, effusion, or pneumothorax. Normal heart size and mediastinal contours. Dorsal column stimulator leads. IMPRESSION: 1. No acute finding. 2. Scarring at the left lung base. Electronically Signed   By: Marnee Spring M.D.   On: 04/08/2020 15:19   DG Abd 1 View  Result Date: 04/09/2020 CLINICAL DATA:  Abdominal distension EXAM: ABDOMEN - 1 VIEW COMPARISON:   04/08/2020 FINDINGS: Multiple dilated loops of small bowel are again identified and relatively stable. No free air is seen. No mass lesion is noted. Postsurgical changes are identified. IMPRESSION: Stable dilated small bowel loops consistent with at least partial small bowel obstruction. Electronically Signed   By: Alcide Clever M.D.   On: 04/09/2020 04:41   DG Abd Portable 1V  Result Date: 04/08/2020 CLINICAL DATA:  Increased bloating with nausea and diarrhea. EXAM: PORTABLE ABDOMEN - 1 VIEW COMPARISON:  04/07/2020 FINDINGS: Interval removal of nasogastric tube. Air is present within the large and small bowel. There are several dilated air-filled small bowel loops in the mid to lower central abdomen measuring up to 4.8 cm in diameter. No free peritoneal air. Remainder of the exam is unchanged. IMPRESSION: Several dilated air-filled small bowel loops in the mid to lower abdomen with air present in the large and small bowel. Findings may be due to ileus versus early/partial small bowel obstruction. Electronically Signed   By: Elberta Fortis M.D.   On: 04/08/2020 11:45   DG Abd Portable 1V-Small Bowel Obstruction Protocol-initial, 8 hr delay  Result Date: 04/07/2020 CLINICAL DATA:  Small-bowel obstruction. EXAM: PORTABLE ABDOMEN - 1 VIEW COMPARISON:  04/07/2020 abdominal radiographs and prior. FINDINGS: Stable positioning of  enteric tube and spinal stimulator. Enteric contrast opacifies the large bowel to the level of the rectum. Gas is seen within nondilated small bowel loops. Sequela of lumbar spinal fixation. No acute osseous abnormality. IMPRESSION: No dilated bowel loops or air-fluid levels. Enteric contrast opacifies the large bowel to level the rectum. Indwelling enteric tube and spinal stimulator, unchanged. Electronically Signed   By: Stana Bunting M.D.   On: 04/07/2020 17:54    Anti-infectives: Anti-infectives (From admission, onward)   None      Assessment/Plan:  Partial SBO  Xray  yesterday still with dilated small bowel not really improved but gas is in the colon. I discussed placing an NG vs continued conservative management  He would like to hold on an NG.  Will allow is emesis again occurs  May need surgery this week  LOS: 2 days    [name] 04/09/2020

## 2020-04-10 LAB — CBC
HCT: 46.8 % (ref 39.0–52.0)
Hemoglobin: 14.5 g/dL (ref 13.0–17.0)
MCH: 26.6 pg (ref 26.0–34.0)
MCHC: 31 g/dL (ref 30.0–36.0)
MCV: 85.7 fL (ref 80.0–100.0)
Platelets: 289 10*3/uL (ref 150–400)
RBC: 5.46 MIL/uL (ref 4.22–5.81)
RDW: 14.3 % (ref 11.5–15.5)
WBC: 5.1 10*3/uL (ref 4.0–10.5)
nRBC: 0 % (ref 0.0–0.2)

## 2020-04-10 LAB — GLUCOSE, CAPILLARY
Glucose-Capillary: 109 mg/dL — ABNORMAL HIGH (ref 70–99)
Glucose-Capillary: 112 mg/dL — ABNORMAL HIGH (ref 70–99)
Glucose-Capillary: 130 mg/dL — ABNORMAL HIGH (ref 70–99)
Glucose-Capillary: 135 mg/dL — ABNORMAL HIGH (ref 70–99)
Glucose-Capillary: 140 mg/dL — ABNORMAL HIGH (ref 70–99)
Glucose-Capillary: 156 mg/dL — ABNORMAL HIGH (ref 70–99)
Glucose-Capillary: 158 mg/dL — ABNORMAL HIGH (ref 70–99)

## 2020-04-10 LAB — COMPREHENSIVE METABOLIC PANEL
ALT: 15 U/L (ref 0–44)
AST: 23 U/L (ref 15–41)
Albumin: 3.9 g/dL (ref 3.5–5.0)
Alkaline Phosphatase: 60 U/L (ref 38–126)
Anion gap: 11 (ref 5–15)
BUN: <5 mg/dL — ABNORMAL LOW (ref 8–23)
CO2: 26 mmol/L (ref 22–32)
Calcium: 8.7 mg/dL — ABNORMAL LOW (ref 8.9–10.3)
Chloride: 104 mmol/L (ref 98–111)
Creatinine, Ser: 0.76 mg/dL (ref 0.61–1.24)
GFR calc Af Amer: >60 mL/min (ref >60)
GFR calc non Af Amer: >60 mL/min (ref >60)
Glucose, Bld: 140 mg/dL — ABNORMAL HIGH (ref 70–99)
Potassium: 4.1 mmol/L (ref 3.5–5.1)
Sodium: 141 mmol/L (ref 135–145)
Total Bilirubin: 0.7 mg/dL (ref 0.3–1.2)
Total Protein: 7.2 g/dL (ref 6.5–8.1)

## 2020-04-10 LAB — MAGNESIUM: Magnesium: 2.3 mg/dL (ref 1.7–2.4)

## 2020-04-10 MED ORDER — HYDROMORPHONE HCL 1 MG/ML IJ SOLN: 0.5000 mg | INTRAMUSCULAR | Status: DC | PRN

## 2020-04-10 MED ORDER — OXYCODONE HCL 5 MG PO TABS
5.0000 mg | ORAL_TABLET | ORAL | Status: DC | PRN
  Administered 2020-04-10 – 2020-04-11 (×5): 10 mg via ORAL
  Filled 2020-04-10 (×5): qty 2

## 2020-04-10 MED ORDER — POTASSIUM CHLORIDE 10 MEQ/100ML IV SOLN
10.0000 meq | INTRAVENOUS | Status: AC
  Administered 2020-04-10: 10 meq via INTRAVENOUS
  Filled 2020-04-10: qty 100

## 2020-04-10 MED ORDER — ACETAMINOPHEN 500 MG PO TABS: 1000.0000 mg | ORAL_TABLET | Freq: Three times a day (TID) | ORAL | Status: DC | PRN

## 2020-04-10 NOTE — Progress Notes (Signed)
Verbal given to d/c IV K. Patients K Is 4.1.

## 2020-04-10 NOTE — Progress Notes (Signed)
PROGRESS NOTE    THANG FLETT  HWT:[id number] DOB: [birth year] DOA: 04/06/2020 PCP: Patient, No Pcp Per    Brief Narrative: 64 y.o. male past medical history of with a recent history of gangrenous cholecystitis back in January where he spent 10 days in the hospital in [place] where he used to live, diabetes mellitus and hypertension comes in complaining of abdominal pain and vomiting that started the day prior to admission.  He stated his abdomen was more more distended he started passing gas and having some loose stools.  [facility]: To be tachycardic and hypoxic, white blood cell count of 16.3 chest CT showed small bowel obstruction, NG tube was placed per hour 1000 L, was started on IV fluids and transferred to [facility].   Assessment & Plan:   Active Problems:   SBO (small bowel obstruction) (HCC)   Essential hypertension   Diabetes mellitus type 2, controlled, without complications (HCC)   Encounter for imaging study to confirm nasogastric (NG) tube placement  #1 small bowel obstruction-patient had NG tube in which was taken out once his symptoms improved.  However he started to have nausea abdominal distention again.  He wants to hold off on placing the NG tube.  I have encouraged him to walk ambulate as much as he can tolerate as much as possible.  Patient with status post recent gangrenous cholecystitis and cholecystectomy. CLD today Surgery foll  #2 history of essential hypertension blood pressure 131/78.  Continue as needed hydralazine.  Patient is on Norvasc at home.    #3 type 2 diabetes - CBG (last 3)  Recent Labs    04/10/20 0340 04/10/20 0739 04/10/20 1141  GLUCAP 135* 140* 156*    so far.  Continue current dose of insulin monitor closely.  On Actos and [place] and Humalog insulin at home.  #4 BPH on Flomax at home  #5 hyperlipidemia on lovastatin at home  #6 hypokalemia recheck labs today pending replete potassium check  magnesium.  Estimated body mass index is 34.26 kg/m as calculated from the following:   Height as of this encounter: 5\' 9"  (1.753 m).   Weight as of this encounter: 105.2 kg.  DVT prophylaxis: Lovenox  code Status: Full code Family Communication: None at bedside  disposition Plan:  Status is: Inpatient  Dispo: The patient is from: Home               Anticipated d/c is to: Home              Anticipated d/c date is unknown              Patient currently is not medically stable to d/c.  Patient admitted with small bowel obstruction  Consultants: General surgery  Procedures: NG tube placed 04/07/2020   Antimicrobials:  None  subjective:  Had some nausea and vomiting earlier today now feels better having flatus  Objective: Vitals:   04/09/20 1339 04/09/20 2211 04/10/20 0620 04/10/20 1321  BP: (!) 148/75 125/64 135/79 (!) 147/73  Pulse: 91 88 81 87  Resp: 18 14 14 18   Temp: 98 F (36.7 C) 97.7 F (36.5 C) 97.7 F (36.5 C) 98.6 F (37 C)  TempSrc: Oral Oral Oral Oral  SpO2: 98% 96% 94% 97%  Weight:      Height:        Intake/Output Summary (Last 24 hours) at 04/10/2020 1420 Last data filed at 04/10/2020 1400 Gross per 24 hour  Intake 2296.65 ml  Output 725 ml  Net 1571.65 ml   Filed Weights   04/06/20 1900  Weight: 105.2 kg    Examination:  General exam: Appears calm and comfortable  Respiratory system: Clear to auscultation. Respiratory effort normal. Cardiovascular system: S1 & S2 heard, RRR. No JVD, murmurs, rubs, gallops or clicks. No pedal edema. Gastrointestinal system: Abdomen is nondistended, soft and nontender. No organomegaly or masses felt.  High-pitched bowel sounds heard. Central nervous system: Alert and oriented. No focal neurological deficits. Extremities: Symmetric 5 x 5 power. Skin: No rashes, lesions or ulcers Psychiatry: Judgement and insight appear normal. Mood & affect appropriate.     Data Reviewed: I have personally reviewed following  labs and imaging studies  CBC: Recent Labs  Lab 04/06/20 1906 04/07/20 0752 04/08/20 0452  WBC 16.3* 9.1 6.1  NEUTROABS 14.1*  --   --   HGB 17.0 16.1 13.9  HCT 55.7* 52.3* 46.1  MCV 85.0 85.5 86.0  PLT 427* 362 310   Basic Metabolic Panel: Recent Labs  Lab 04/06/20 1906 04/07/20 0752 04/08/20 0452  NA 137  --  132*  K 3.7  --  3.3*  CL 97*  --  98  CO2 26  --  26  GLUCOSE 221*  --  116*  BUN 14  --  13  CREATININE 1.10 0.89 0.92  CALCIUM 9.0  --  7.3*   GFR: Estimated Creatinine Clearance: 97 mL/min (by C-G formula based on SCr of 0.92 mg/dL). Liver Function Tests: Recent Labs  Lab 04/06/20 1906  AST 28  ALT 22  ALKPHOS 94  BILITOT 1.1  PROT 8.5*  ALBUMIN 4.9   Recent Labs  Lab 04/06/20 1906  LIPASE 37   No results for input(s): AMMONIA in the last 168 hours. Coagulation Profile: Recent Labs  Lab 04/08/20 0452  INR 1.1   Cardiac Enzymes: No results for input(s): CKTOTAL, CKMB, CKMBINDEX, TROPONINI in the last 168 hours. BNP (last 3 results) No results for input(s): PROBNP in the last 8760 hours. HbA1C: No results for input(s): HGBA1C in the last 72 hours. CBG: Recent Labs  Lab 04/09/20 2015 04/09/20 2359 04/10/20 0340 04/10/20 0739 04/10/20 1141  GLUCAP 143* 112* 135* 140* 156*   Lipid Profile: No results for input(s): CHOL, HDL, LDLCALC, TRIG, CHOLHDL, LDLDIRECT in the last 72 hours. Thyroid Function Tests: No results for input(s): TSH, T4TOTAL, FREET4, T3FREE, THYROIDAB in the last 72 hours. Anemia Panel: No results for input(s): VITAMINB12, FOLATE, FERRITIN, TIBC, IRON, RETICCTPCT in the last 72 hours. Sepsis Labs: No results for input(s): PROCALCITON, LATICACIDVEN in the last 168 hours.  Recent Results (from the past 240 hour(s))  SARS Coronavirus 2 by RT PCR (hospital order, performed in [facility] hospital lab) Nasopharyngeal Nasopharyngeal Swab     Status: None   Collection Time: 04/06/20  9:38 PM   Specimen: Nasopharyngeal  Swab  Result Value Ref Range Status   SARS Coronavirus 2 NEGATIVE NEGATIVE Final    Comment: (NOTE) SARS-CoV-2 target nucleic acids are NOT DETECTED.  The SARS-CoV-2 RNA is generally detectable in upper and lower respiratory specimens during the acute phase of infection. The lowest concentration of SARS-CoV-2 viral copies this assay can detect is 250 copies / mL. A negative result does not preclude SARS-CoV-2 infection and should not be used as the sole basis for treatment or other patient management decisions.  A negative result may occur with improper specimen collection / handling, submission of specimen other than nasopharyngeal swab, presence of viral mutation(s) within  the areas targeted by this assay, and inadequate number of viral copies (<250 copies / mL). A negative result must be combined with clinical observations, patient history, and epidemiological information.  Fact Sheet for Patients:   BoilerBrush.com.cy  Fact Sheet for Healthcare Providers: https://pope.com/  This test is not yet approved or  cleared by the [place] FDA and has been authorized for detection and/or diagnosis of SARS-CoV-2 by FDA under an Emergency Use Authorization (EUA).  This EUA will remain in effect (meaning this test can be used) for the duration of the COVID-19 declaration under Section 564(b)(1) of the Act, 21 U.S.C. section 360bbb-3(b)(1), unless the authorization is terminated or revoked sooner.  Performed at [facility], [address] [place], [place] [address]          Radiology Studies: DG Abd 1 View  Result Date: 04/09/2020 CLINICAL DATA:  Abdominal distension EXAM: ABDOMEN - 1 VIEW COMPARISON:  04/08/2020 FINDINGS: Multiple dilated loops of small bowel are again identified and relatively stable. No free air is seen. No mass lesion is noted. Postsurgical changes are identified. IMPRESSION: Stable dilated small  bowel loops consistent with at least partial small bowel obstruction. Electronically Signed   By: Alcide Clever M.D.   On: 04/09/2020 04:41        Scheduled Meds: . enoxaparin (LOVENOX) injection  40 mg Subcutaneous Q24H  . insulin aspart  0-9 Units Subcutaneous Q4H  . insulin detemir  5 Units Subcutaneous BID  . mouth rinse  15 mL Mouth Rinse BID  . tamsulosin  0.4 mg Oral QPC breakfast   Continuous Infusions: . famotidine (PEPCID) IV 20 mg (04/10/20 1058)     LOS: 3 days     Alwyn Ren, MD  04/10/2020, 2:20 PM

## 2020-04-10 NOTE — Progress Notes (Signed)
Subjective: CC: Doing well. No further emesis since yesterday morning. Passing flatus. 3 liquid bm's. Pain has resolved. Some occasional nausea but none currently. Some burping and belching. Mobilizing.   Objective: Vital signs in last 24 hours: Temp:  [97.7 F (36.5 C)-98 F (36.7 C)] 97.7 F (36.5 C) (08/16 0620) Pulse Rate:  [81-91] 81 (08/16 0620) Resp:  [14-18] 14 (08/16 0620) BP: (125-148)/(64-79) 135/79 (08/16 0620) SpO2:  [94 %-98 %] 94 % (08/16 0620) Last BM Date: 04/10/20  Intake/Output from previous day: 08/15 0701 - 08/16 0700 In: 2272 [P.O.:120; I.V.:2102; IV Piggyback:50] Out: -  Intake/Output this shift: Total I/O In: 240 [P.O.:240] Out: 325 [Urine:325]  PE: Gen:  Alert, NAD, pleasant Pulm:  Rate and effort normal  Abd: Soft, mild distension, some epigastric and luq tenderness, otherwise nt, +bs Psych: A&Ox3  Skin: no rashes noted, warm and dry   Lab Results:  Recent Labs    04/08/20 0452  WBC 6.1  HGB 13.9  HCT 46.1  PLT 310   BMET Recent Labs    04/08/20 0452  NA 132*  K 3.3*  CL 98  CO2 26  GLUCOSE 116*  BUN 13  CREATININE 0.92  CALCIUM 7.3*   PT/INR Recent Labs    04/08/20 0452  LABPROT 14.2  INR 1.1   CMP     Component Value Date/Time   NA 132 (L) 04/08/2020 0452   K 3.3 (L) 04/08/2020 0452   CL 98 04/08/2020 0452   CO2 26 04/08/2020 0452   GLUCOSE 116 (H) 04/08/2020 0452   BUN 13 04/08/2020 0452   CREATININE 0.92 04/08/2020 0452   CALCIUM 7.3 (L) 04/08/2020 0452   PROT 8.5 (H) 04/06/2020 1906   ALBUMIN 4.9 04/06/2020 1906   AST 28 04/06/2020 1906   ALT 22 04/06/2020 1906   ALKPHOS 94 04/06/2020 1906   BILITOT 1.1 04/06/2020 1906   GFRNONAA >60 04/08/2020 0452   GFRAA >60 04/08/2020 0452   Lipase     Component Value Date/Time   LIPASE 37 04/06/2020 1906       Studies/Results: DG Chest 1 View  Result Date: 04/08/2020 CLINICAL DATA:  Hypoxemia.  Recent small-bowel obstruction EXAM: CHEST  1 VIEW  COMPARISON:  None. FINDINGS: Linear opacities at the left base seen on recent abdominal CT and attributed to scarring. There is no edema, consolidation, effusion, or pneumothorax. Normal heart size and mediastinal contours. Dorsal column stimulator leads. IMPRESSION: 1. No acute finding. 2. Scarring at the left lung base. Electronically Signed   By: Marnee Spring M.D.   On: 04/08/2020 15:19   DG Abd 1 View  Result Date: 04/09/2020 CLINICAL DATA:  Abdominal distension EXAM: ABDOMEN - 1 VIEW COMPARISON:  04/08/2020 FINDINGS: Multiple dilated loops of small bowel are again identified and relatively stable. No free air is seen. No mass lesion is noted. Postsurgical changes are identified. IMPRESSION: Stable dilated small bowel loops consistent with at least partial small bowel obstruction. Electronically Signed   By: Alcide Clever M.D.   On: 04/09/2020 04:41    Anti-infectives: Anti-infectives (From admission, onward)   None       Assessment/Plan HTN DM2  SBO - PSH: right inguinal hernia repair x2, umbilical hernia repair, laparoscopic cholecystectomy 08/2019 >> all surgeries done in [place] - CT 8/12 shows shows high-grade partial or developing complete mid small bowel obstruction with a point of transition seen within the right mid abdomen - Contrast noted in colon on SBOP 8/13 -  Had some emesis since NGT was removed. This has now resolved and has ROBF. Allow CLD today - Mobilize for bowel function - Keep K >4 and Mg > 2 for bowel function  FEN - CLD VTE - SCDs, Lovenox ID - None    LOS: 3 days    Jacinto Halim , [facility] 04/10/2020, 11:30 AM Please see Amion for pager number during day hours 7:00am-4:30pm

## 2020-04-10 NOTE — Care Management Important Message (Signed)
Important Message  Patient Details IM Letter given to the Patient Name: PRYOR GUETTLER MRN: [id number] Date of Birth: [birth year]   Medicare Important Message Given:  Yes     Caren Macadam 04/10/2020, 1:28 PM

## 2020-04-11 LAB — COMPREHENSIVE METABOLIC PANEL
ALT: 15 U/L (ref 0–44)
AST: 22 U/L (ref 15–41)
Albumin: 3.7 g/dL (ref 3.5–5.0)
Alkaline Phosphatase: 60 U/L (ref 38–126)
Anion gap: 9 (ref 5–15)
BUN: <5 mg/dL — ABNORMAL LOW (ref 8–23)
CO2: 24 mmol/L (ref 22–32)
Calcium: 8.5 mg/dL — ABNORMAL LOW (ref 8.9–10.3)
Chloride: 104 mmol/L (ref 98–111)
Creatinine, Ser: 0.86 mg/dL (ref 0.61–1.24)
GFR calc Af Amer: >60 mL/min (ref >60)
GFR calc non Af Amer: >60 mL/min (ref >60)
Glucose, Bld: 147 mg/dL — ABNORMAL HIGH (ref 70–99)
Potassium: 3.8 mmol/L (ref 3.5–5.1)
Sodium: 137 mmol/L (ref 135–145)
Total Bilirubin: 0.6 mg/dL (ref 0.3–1.2)
Total Protein: 6.8 g/dL (ref 6.5–8.1)

## 2020-04-11 LAB — CBC
HCT: 47.1 % (ref 39.0–52.0)
Hemoglobin: 14.2 g/dL (ref 13.0–17.0)
MCH: 25.9 pg — ABNORMAL LOW (ref 26.0–34.0)
MCHC: 30.1 g/dL (ref 30.0–36.0)
MCV: 85.9 fL (ref 80.0–100.0)
Platelets: 274 10*3/uL (ref 150–400)
RBC: 5.48 MIL/uL (ref 4.22–5.81)
RDW: 14.4 % (ref 11.5–15.5)
WBC: 6 10*3/uL (ref 4.0–10.5)
nRBC: 0 % (ref 0.0–0.2)

## 2020-04-11 LAB — GLUCOSE, CAPILLARY
Glucose-Capillary: 124 mg/dL — ABNORMAL HIGH (ref 70–99)
Glucose-Capillary: 137 mg/dL — ABNORMAL HIGH (ref 70–99)
Glucose-Capillary: 164 mg/dL — ABNORMAL HIGH (ref 70–99)
Glucose-Capillary: 170 mg/dL — ABNORMAL HIGH (ref 70–99)
Glucose-Capillary: 167 mg/dL — ABNORMAL HIGH (ref 70–99)
Glucose-Capillary: 140 mg/dL — ABNORMAL HIGH (ref 70–99)

## 2020-04-11 NOTE — Progress Notes (Signed)
PROGRESS NOTE    WYMON SWANEY  QVZ:[id number] DOB: 1956-05-07 DOA: 04/06/2020 PCP: Patient, No Pcp Per    Brief Narrative: 64 y.o. male past medical history of with a recent history of gangrenous cholecystitis back in January where he spent 10 days in the hospital in [place] where he used to live, diabetes mellitus and hypertension comes in complaining of abdominal pain and vomiting that started the day prior to admission.  He stated his abdomen was more more distended he started passing gas and having some loose stools.  [facility]: To be tachycardic and hypoxic, white blood cell count of 16.3 chest CT showed small bowel obstruction, NG tube was placed per hour 1000 L, was started on IV fluids and transferred to [facility]   Assessment & Plan:   Active Problems:   Small bowel obstruction (HCC)   Essential hypertension   Diabetes mellitus type 2, controlled, without complications (HCC)   Encounter for imaging study to confirm nasogastric (NG) tube placement  #1 small bowel obstruction-proved with NG tube, n.p.o., IV fluids and pain control.  NG tube has been taken out.  Patient started on full liquid diet today.  He would like to stay another day to make sure that he is going to be okay.  He has had multiple abdominal surgeries including multiple inguinal hernia repair and recent cholecystectomy.   #2 history of essential hypertension blood pressure 143/76 continue Norvasc.    #3 type 2 diabetes - CBG (last 3)  Recent Labs    04/11/20 0355 04/11/20 0746 04/11/20 1150  GLUCAP 124* 137* 164*   Continue current dose of insulin monitor closely.  On Actos and [place] and Humalog insulin at home which will need to be restarted upon discharge.  #4 BPH on Flomax at home  #5 hyperlipidemia on lovastatin at home  #6 hypokalemia resolved.  Magnesium normal.    Estimated body mass index is 34.26 kg/m as calculated from the following:   Height as of this encounter:  5\' 9"  (1.753 m).   Weight as of this encounter: 105.2 kg.  DVT prophylaxis: Lovenox  code Status: Full code Family Communication: None at bedside  disposition Plan:  Status is: Inpatient  Dispo: The patient is from: Home               Anticipated d/c is to: Home              Anticipated d/c 1 day              Patient currently is not medically stable to d/c.  Patient admitted with small bowel obstruction just starting full liquid diet today  Consultants: General surgery  Procedures: NG tube placed 04/07/2020   Antimicrobials:  None  subjective:  Feeling better anxious to eat some food and make sure he is going to be okay would like to stay another night to make sure he is going to be okay  objective: Vitals:   04/10/20 2042 04/11/20 0131 04/11/20 0555 04/11/20 1328  BP: 124/68 (!) 149/76 (!) 142/79 (!) 143/76  Pulse: 89 86 86 85  Resp: 18 18 18 18   Temp: 98.7 F (37.1 C) 98.4 F (36.9 C) 98.3 F (36.8 C) 97.8 F (36.6 C)  TempSrc: Oral Oral Oral Oral  SpO2: 96% 92% 95% 96%  Weight:      Height:        Intake/Output Summary (Last 24 hours) at 04/11/2020 1433 Last data filed at 04/11/2020 1400  Gross per 24 hour  Intake 2069.38 ml  Output 850 ml  Net 1219.38 ml   Filed Weights   04/06/20 1900  Weight: 105.2 kg    Examination:  General exam: Appears calm and comfortable  Respiratory system: Clear to auscultation. Respiratory effort normal. Cardiovascular system: S1 & S2 heard, RRR. No JVD, murmurs, rubs, gallops or clicks. No pedal edema. Gastrointestinal system: Abdomen is nondistended, soft and nontender. No organomegaly or masses felt.  High-pitched bowel sounds heard. Central nervous system: Alert and oriented. No focal neurological deficits. Extremities: Symmetric 5 x 5 power. Skin: No rashes, lesions or ulcers Psychiatry: Judgement and insight appear normal. Mood & affect appropriate.     Data Reviewed: I have personally reviewed following labs and  imaging studies  CBC: Recent Labs  Lab 04/06/20 1906 04/07/20 0752 04/08/20 0452 04/10/20 1503 04/11/20 0516  WBC 16.3* 9.1 6.1 5.1 6.0  NEUTROABS 14.1*  --   --   --   --   HGB 17.0 16.1 13.9 14.5 14.2  HCT 55.7* 52.3* 46.1 46.8 47.1  MCV 85.0 85.5 86.0 85.7 85.9  PLT 427* 362 310 289 274   Basic Metabolic Panel: Recent Labs  Lab 04/06/20 1906 04/07/20 0752 04/08/20 0452 04/10/20 1503 04/11/20 0516  NA 137  --  132* 141 137  K 3.7  --  3.3* 4.1 3.8  CL 97*  --  98 104 104  CO2 26  --  26 26 24   GLUCOSE 221*  --  116* 140* 147*  BUN 14  --  13 <5* <5*  CREATININE 1.10 0.89 0.92 0.76 0.86  CALCIUM 9.0  --  7.3* 8.7* 8.5*  MG  --   --   --  2.3  --    GFR: Estimated Creatinine Clearance: 103.7 mL/min (by C-G formula based on SCr of 0.86 mg/dL). Liver Function Tests: Recent Labs  Lab 04/06/20 1906 04/10/20 1503 04/11/20 0516  AST 28 23 22   ALT 22 15 15   ALKPHOS 94 60 60  BILITOT 1.1 0.7 0.6  PROT 8.5* 7.2 6.8  ALBUMIN 4.9 3.9 3.7   Recent Labs  Lab 04/06/20 1906  LIPASE 37   No results for input(s): AMMONIA in the last 168 hours. Coagulation Profile: Recent Labs  Lab 04/08/20 0452  INR 1.1   Cardiac Enzymes: No results for input(s): CKTOTAL, CKMB, CKMBINDEX, TROPONINI in the last 168 hours. BNP (last 3 results) No results for input(s): PROBNP in the last 8760 hours. HbA1C: No results for input(s): HGBA1C in the last 72 hours. CBG: Recent Labs  Lab 04/10/20 1939 04/10/20 2333 04/11/20 0355 04/11/20 0746 04/11/20 1150  GLUCAP 158* 109* 124* 137* 164*   Lipid Profile: No results for input(s): CHOL, HDL, LDLCALC, TRIG, CHOLHDL, LDLDIRECT in the last 72 hours. Thyroid Function Tests: No results for input(s): TSH, T4TOTAL, FREET4, T3FREE, THYROIDAB in the last 72 hours. Anemia Panel: No results for input(s): VITAMINB12, FOLATE, FERRITIN, TIBC, IRON, RETICCTPCT in the last 72 hours. Sepsis Labs: No results for input(s): PROCALCITON,  LATICACIDVEN in the last 168 hours.  Recent Results (from the past 240 hour(s))  SARS Coronavirus 2 by RT PCR (hospital order, performed in [facility] hospital lab) Nasopharyngeal Nasopharyngeal Swab     Status: None   Collection Time: 04/06/20  9:38 PM   Specimen: Nasopharyngeal Swab  Result Value Ref Range Status   SARS Coronavirus 2 NEGATIVE NEGATIVE Final    Comment: (NOTE) SARS-CoV-2 target nucleic acids are NOT DETECTED.  The SARS-CoV-2  RNA is generally detectable in upper and lower respiratory specimens during the acute phase of infection. The lowest concentration of SARS-CoV-2 viral copies this assay can detect is 250 copies / mL. A negative result does not preclude SARS-CoV-2 infection and should not be used as the sole basis for treatment or other patient management decisions.  A negative result may occur with improper specimen collection / handling, submission of specimen other than nasopharyngeal swab, presence of viral mutation(s) within the areas targeted by this assay, and inadequate number of viral copies (<250 copies / mL). A negative result must be combined with clinical observations, patient history, and epidemiological information.  Fact Sheet for Patients:   BoilerBrush.com.cy  Fact Sheet for Healthcare Providers: https://pope.com/  This test is not yet approved or  cleared by the [place] FDA and has been authorized for detection and/or diagnosis of SARS-CoV-2 by FDA under an Emergency Use Authorization (EUA).  This EUA will remain in effect (meaning this test can be used) for the duration of the COVID-19 declaration under Section 564(b)(1) of the Act, 21 U.S.C. section 360bbb-3(b)(1), unless the authorization is terminated or revoked sooner.  Performed at [facility], [address] [place], [place] [address]          Radiology Studies: No results found.      Scheduled  Meds: . enoxaparin (LOVENOX) injection  40 mg Subcutaneous Q24H  . insulin aspart  0-9 Units Subcutaneous Q4H  . insulin detemir  5 Units Subcutaneous BID  . mouth rinse  15 mL Mouth Rinse BID  . tamsulosin  0.4 mg Oral QPC breakfast   Continuous Infusions: . famotidine (PEPCID) IV 20 mg (04/11/20 0937)     LOS: 4 days     Alwyn Ren, MD  04/11/2020, 2:33 PM

## 2020-04-11 NOTE — Progress Notes (Signed)
       Subjective: CC: Doing well. No abdominal pain at the moment. Tolerating FLD without n/v. Passing flatus. 3 BM's that were liquidy in the last 24 hours.   Objective: Vital signs in last 24 hours: Temp:  [98.3 F (36.8 C)-98.7 F (37.1 C)] 98.3 F (36.8 C) (08/17 0555) Pulse Rate:  [86-89] 86 (08/17 0555) Resp:  [18] 18 (08/17 0555) BP: (124-149)/(68-79) 142/79 (08/17 0555) SpO2:  [92 %-97 %] 95 % (08/17 0555) Last BM Date: 04/11/20  Intake/Output from previous day: 08/16 0701 - 08/17 0700 In: 1869.4 [P.O.:1800; IV Piggyback:69.4] Out: 1575 [Urine:1575] Intake/Output this shift: Total I/O In: 440 [P.O.:390; IV Piggyback:50] Out: 0   PE: Gen:  Alert, NAD, pleasant Pulm:  Rate and effort normal  Abd: Soft, mild distension, some mild epigastric and luq tenderness, otherwise nt, +bs Psych: A&Ox3  Skin: no rashes noted, warm and dry   Lab Results:  Recent Labs    04/10/20 1503 04/11/20 0516  WBC 5.1 6.0  HGB 14.5 14.2  HCT 46.8 47.1  PLT 289 274   BMET Recent Labs    04/10/20 1503 04/11/20 0516  NA 141 137  K 4.1 3.8  CL 104 104  CO2 26 24  GLUCOSE 140* 147*  BUN <5* <5*  CREATININE 0.76 0.86  CALCIUM 8.7* 8.5*   PT/INR No results for input(s): LABPROT, INR in the last 72 hours. CMP     Component Value Date/Time   NA 137 04/11/2020 0516   K 3.8 04/11/2020 0516   CL 104 04/11/2020 0516   CO2 24 04/11/2020 0516   GLUCOSE 147 (H) 04/11/2020 0516   BUN <5 (L) 04/11/2020 0516   CREATININE 0.86 04/11/2020 0516   CALCIUM 8.5 (L) 04/11/2020 0516   PROT 6.8 04/11/2020 0516   ALBUMIN 3.7 04/11/2020 0516   AST 22 04/11/2020 0516   ALT 15 04/11/2020 0516   ALKPHOS 60 04/11/2020 0516   BILITOT 0.6 04/11/2020 0516   GFRNONAA >60 04/11/2020 0516   GFRAA >60 04/11/2020 0516   Lipase     Component Value Date/Time   LIPASE 37 04/06/2020 1906       Studies/Results: No results found.  Anti-infectives: Anti-infectives (From admission, onward)     None       Assessment/Plan HTN DM2  SBO -PSH: right inguinal hernia repair x2, umbilical hernia repair, laparoscopic cholecystectomy 08/2019 >> all surgeries done in [place] - Tolerating FLD and has ROBF. Adv to soft diet. If tolerates can be d/c'd either later today or tomorrow from our standpoint.   FEN - Soft  VTE - SCDs, Lovenox ID - None    LOS: 4 days    Jacinto Halim , [facility] 04/11/2020, 11:02 AM Please see Amion for pager number during day hours 7:00am-4:30pm

## 2020-04-12 DIAGNOSIS — N400 Enlarged prostate without lower urinary tract symptoms: Secondary | ICD-10-CM

## 2020-04-12 LAB — GLUCOSE, CAPILLARY
Glucose-Capillary: 129 mg/dL — ABNORMAL HIGH (ref 70–99)
Glucose-Capillary: 142 mg/dL — ABNORMAL HIGH (ref 70–99)

## 2020-04-12 MED ORDER — FAMOTIDINE 20 MG PO TABS
20.0000 mg | ORAL_TABLET | Freq: Two times a day (BID) | ORAL | Status: DC
  Administered 2020-04-12: 20 mg via ORAL
  Filled 2020-04-12: qty 1

## 2020-04-12 NOTE — Progress Notes (Signed)
       Subjective: CC: Doing well. No abdominal pain. Tolerating soft diet without n/v. BM yesterday that was liquidy.   Objective: Vital signs in last 24 hours: Temp:  [97.8 F (36.6 C)-99.2 F (37.3 C)] 98.6 F (37 C) (08/18 0605) Pulse Rate:  [82-86] 82 (08/18 0605) Resp:  [18] 18 (08/18 0605) BP: (138-143)/(68-76) 142/72 (08/18 0605) SpO2:  [92 %-97 %] 97 % (08/18 0605) Last BM Date: 04/11/20  Intake/Output from previous day: 08/17 0701 - 08/18 0700 In: 2339.9 [P.O.:2190; IV Piggyback:149.9] Out: 1350 [Urine:1350] Intake/Output this shift: No intake/output data recorded.  PE: Gen: Alert, NAD, pleasant Pulm:Rate and effort normal Abd: Soft,mild distension, some mild RUQ tenderness, otherwise nt, +bs Psych: A&Ox3  Skin: no rashes noted, warm and dry  Lab Results:  Recent Labs    04/10/20 1503 04/11/20 0516  WBC 5.1 6.0  HGB 14.5 14.2  HCT 46.8 47.1  PLT 289 274   BMET Recent Labs    04/10/20 1503 04/11/20 0516  NA 141 137  K 4.1 3.8  CL 104 104  CO2 26 24  GLUCOSE 140* 147*  BUN <5* <5*  CREATININE 0.76 0.86  CALCIUM 8.7* 8.5*   PT/INR No results for input(s): LABPROT, INR in the last 72 hours. CMP     Component Value Date/Time   NA 137 04/11/2020 0516   K 3.8 04/11/2020 0516   CL 104 04/11/2020 0516   CO2 24 04/11/2020 0516   GLUCOSE 147 (H) 04/11/2020 0516   BUN <5 (L) 04/11/2020 0516   CREATININE 0.86 04/11/2020 0516   CALCIUM 8.5 (L) 04/11/2020 0516   PROT 6.8 04/11/2020 0516   ALBUMIN 3.7 04/11/2020 0516   AST 22 04/11/2020 0516   ALT 15 04/11/2020 0516   ALKPHOS 60 04/11/2020 0516   BILITOT 0.6 04/11/2020 0516   GFRNONAA >60 04/11/2020 0516   GFRAA >60 04/11/2020 0516   Lipase     Component Value Date/Time   LIPASE 37 04/06/2020 1906       Studies/Results: No results found.  Anti-infectives: Anti-infectives (From admission, onward)   None       Assessment/Plan HTN DM2  SBO -PSH: right inguinal hernia  repair x2, umbilical hernia repair, laparoscopic cholecystectomy 08/2019 >> all surgeries done in [place] - Tolerating soft diet and having bowel function. Okay for d/c from our standpoint. I discussed this with TRH.   FEN -Soft  VTE -SCDs, Lovenox ID -None   LOS: 5 days    Jacinto Halim , [facility] Surgery 04/12/2020, 10:25 AM Please see Amion for pager number during day hours 7:00am-4:30pm

## 2020-04-12 NOTE — Discharge Summary (Signed)
Discharge Summary  Brandon [name] ZOX:[id number] DOB: [birth year]  PCP: Patient, No Pcp Per  Admit date: 04/06/2020 Discharge date: 04/12/2020  Time spent: 35 mins  Recommendations for Outpatient Follow-up:  1. PCP in 1 week  Discharge Diagnoses:  Active Hospital Problems   Diagnosis Date Noted  . Encounter for imaging study to confirm nasogastric (NG) tube placement   . Essential hypertension 04/07/2020  . Diabetes mellitus type 2, controlled, without complications (HCC) 04/07/2020  . SBO (small bowel obstruction) (HCC) 04/06/2020    Resolved Hospital Problems  No resolved problems to display.    Discharge Condition: Stable  Diet recommendation: As tolerated  Vitals:   04/11/20 2036 04/12/20 0605  BP: 138/68 (!) 142/72  Pulse: 86 82  Resp: 18 18  Temp: 99.2 F (37.3 C) 98.6 F (37 C)  SpO2: 92% 97%    History of present illness:  64 y.o.malepast medical history of with a recent history of gangrenous cholecystitis back in January where he spent 10 days in the hospital in [place] where he used to live, diabetes mellitus and hypertension comes in complaining of abdominal pain and vomiting that started the day prior to admission.He stated his abdomen was more more distended he started passing gas and having some loose stools. In the ED, noted to be tachycardic and hypoxic, white blood cell count of 16.3, CT showed small bowel obstruction, NG tube was placed, started on IV fluids and [facility].  General surgery consulted.    Today, pt denied any worsening abdominal pain, nausea, vomiting, chest pain, shortness of breath.  Patient reports passing gas and having some loose stools, tolerating soft diet. General surgery on board, stable for patient to be discharged home.  Patient very eager to be discharged, reports feeling much better.   Hospital Course:  Active Problems:   SBO (small bowel obstruction) (HCC)   Essential hypertension   Diabetes mellitus type 2,  controlled, without complications (HCC)   Encounter for imaging study to confirm nasogastric (NG) tube placement  SBO History of multiple surgeries Currently resolved, s/p NG tube, IV fluids and pain control Tolerating soft diet, passing gas, having loose stools, denies any worsening abdominal pain General surgery consulted, okay to be discharged from standpoint  Hypertension Continue home Norvasc  Diabetes mellitus type 2 Continue home regimen  Hyperlipidemia Continue statins  BPH Continue Flomax        Malnutrition Type:      Malnutrition Characteristics:      Nutrition Interventions:      Estimated body mass index is 34.26 kg/m as calculated from the following:   Height as of this encounter: 5\' 9"  (1.753 m).   Weight as of this encounter: 105.2 kg.    Procedures:  None  Consultations:  General surgery  Discharge Exam: BP (!) 142/72 (BP Location: Right Arm)   Pulse 82   Temp 98.6 F (37 C) (Oral)   Resp 18   Ht 5\' 9"  (1.753 m)   Wt 105.2 kg   SpO2 97%   BMI 34.26 kg/m   General: NAD Cardiovascular: S1, S2 present Abdomen: Soft, nontender, nondistended, bowel sounds present Respiratory: CTA B    Discharge Instructions You were cared for by a hospitalist during your hospital stay. If you have any questions about your discharge medications or the care you received while you were in the hospital after you are discharged, you can call the unit and asked to speak with the hospitalist on call if the hospitalist that  took care of you is not available. Once you are discharged, your primary care physician will handle any further medical issues. Please note that NO REFILLS for any discharge medications will be authorized once you are discharged, as it is imperative that you return to your primary care physician (or establish a relationship with a primary care physician if you do not have one) for your aftercare needs so that they can reassess your need  for medications and monitor your lab values.  Discharge Instructions    Diet - low sodium heart healthy   Complete by: As directed    Increase activity slowly   Complete by: As directed      Allergies as of 04/12/2020      Reactions   Ancef [cefazolin] Rash   Latex Rash   Levaquin [levofloxacin] Rash      Medication List    TAKE these medications   amLODipine 10 MG tablet Commonly known as: NORVASC Take 10 mg by mouth daily.   ARIPiprazole 10 MG tablet Commonly known as: ABILIFY Take 10 mg by mouth daily.   aspirin 81 MG chewable tablet Chew 81 mg by mouth daily.   empagliflozin 25 MG Tabs tablet Commonly known as: JARDIANCE Take 25 mg by mouth daily.   insulin lispro 100 UNIT/ML injection Commonly known as: HUMALOG Inject 0-10 Units into the skin 3 (three) times daily before meals. Sliding Scale   lovastatin 40 MG 24 hr tablet Commonly known as: ALTOPREV Take 40 mg by mouth at bedtime.   pantoprazole 40 MG tablet Commonly known as: PROTONIX Take 40 mg by mouth daily.   pioglitazone 30 MG tablet Commonly known as: ACTOS Take 30 mg by mouth daily.   tamsulosin 0.4 MG Caps capsule Commonly known as: FLOMAX Take 0.4 mg by mouth.   Tresiba 100 UNIT/ML Soln Generic drug: Insulin Degludec Inject 60 Units into the skin.      Allergies  Allergen Reactions  . Ancef [Cefazolin] Rash  . Latex Rash  . Levaquin [Levofloxacin] Rash      The results of significant diagnostics from this hospitalization (including imaging, microbiology, ancillary and laboratory) are listed below for reference.    Significant Diagnostic Studies: DG Chest 1 View  Result Date: 04/08/2020 CLINICAL DATA:  Hypoxemia.  Recent small-bowel obstruction EXAM: CHEST  1 VIEW COMPARISON:  None. FINDINGS: Linear opacities at the left base seen on recent abdominal CT and attributed to scarring. There is no edema, consolidation, effusion, or pneumothorax. Normal heart size and mediastinal  contours. Dorsal column stimulator leads. IMPRESSION: 1. No acute finding. 2. Scarring at the left lung base. Electronically Signed   By: Marnee Spring M.D.   On: 04/08/2020 15:19   DG Abd 1 View  Result Date: 04/09/2020 CLINICAL DATA:  Abdominal distension EXAM: ABDOMEN - 1 VIEW COMPARISON:  04/08/2020 FINDINGS: Multiple dilated loops of small bowel are again identified and relatively stable. No free air is seen. No mass lesion is noted. Postsurgical changes are identified. IMPRESSION: Stable dilated small bowel loops consistent with at least partial small bowel obstruction. Electronically Signed   By: Alcide Clever M.D.   On: 04/09/2020 04:41   DG Abdomen 1 View  Result Date: 04/06/2020 CLINICAL DATA:  NG tube placement EXAM: ABDOMEN - 1 VIEW COMPARISON:  04/06/2020 FINDINGS: Ascending thoracic stimulator leads. Esophageal tube tip and side port overlie the proximal stomach. Partially visualized dilated small bowel consistent with obstruction. IMPRESSION: Esophageal tube tip overlies the proximal stomach. Electronically Signed  By: Jasmine Pang M.D.   On: 04/06/2020 23:43   CT ABDOMEN PELVIS W CONTRAST  Result Date: 04/06/2020 CLINICAL DATA:  Abdominal pain, nausea, vomiting, diarrhea EXAM: CT ABDOMEN AND PELVIS WITH CONTRAST TECHNIQUE: Multidetector CT imaging of the abdomen and pelvis was performed using the standard protocol following bolus administration of intravenous contrast. CONTRAST:  OMNIPAQUE IOHEXOL 300 MG/ML  SOLN COMPARISON:  None. FINDINGS: Lower chest: Moderate right coronary artery calcification. Global cardiac size within normal limits. No pericardial effusion. Visualized lung bases are clear bilaterally. Hepatobiliary: Mild hepatic steatosis. Liver otherwise unremarkable. Status post cholecystectomy. No intra or extrahepatic biliary ductal dilation. Pancreas: Unremarkable Spleen: Unremarkable Adrenals/Urinary Tract: The adrenal glands are unremarkable. The kidneys are normal  in size and position. Simple cortical cyst noted within the upper pole of the right kidney. The kidneys are otherwise unremarkable. The bladder is unremarkable. Stomach/Bowel: A developing mid small bowel obstruction is identified with a point of transition seen within the right mid abdomen best noted on axial image # 42, sagittal image # 44 and coronal image # 17. Mild tethering of the small bowel in this location suggests that this may relate to an underlying adhesion. The obstruction appears incomplete at this point as the a small amount of gas and fluid is seen downstream from this point. The stomach is unremarkable. Mild sigmoid and descending colonic diverticulosis. The large bowel is otherwise unremarkable. Appendix normal. No free intraperitoneal gas or fluid. Vascular/Lymphatic: The abdominal vasculature is age-appropriate with mild aortoiliac atherosclerotic calcification. No aneurysm. No pathologic adenopathy within the abdomen and pelvis. Reproductive: Prostate is unremarkable. Penile prosthesis is in place with the reservoir noted within the left inferior rectus sheath. Other: Tiny right spigelian hernia is seen with herniation of a small amount of mesenteric fat. Musculoskeletal: L4-5 lumbar fusion with instrumentation has been performed. Dorsal column stimulator leads enter the spinal canal at L1-2 with the power pack placed within the subcutaneous fat of the left gluteal region. No acute bone abnormality. IMPRESSION: 1. High-grade partial or developing complete mid small bowel obstruction with a point of transition seen within the right mid abdomen. 2. Mild hepatic steatosis. Aortic Atherosclerosis (ICD10-I70.0). Electronically Signed   By: Helyn Numbers MD   On: 04/06/2020 22:31   DG Abd Portable 1V  Result Date: 04/08/2020 CLINICAL DATA:  Increased bloating with nausea and diarrhea. EXAM: PORTABLE ABDOMEN - 1 VIEW COMPARISON:  04/07/2020 FINDINGS: Interval removal of nasogastric tube. Air is  present within the large and small bowel. There are several dilated air-filled small bowel loops in the mid to lower central abdomen measuring up to 4.8 cm in diameter. No free peritoneal air. Remainder of the exam is unchanged. IMPRESSION: Several dilated air-filled small bowel loops in the mid to lower abdomen with air present in the large and small bowel. Findings may be due to ileus versus early/partial small bowel obstruction. Electronically Signed   By: Elberta Fortis M.D.   On: 04/08/2020 11:45   DG Abd Portable 1V-Small Bowel Obstruction Protocol-initial, 8 hr delay  Result Date: 04/07/2020 CLINICAL DATA:  Small-bowel obstruction. EXAM: PORTABLE ABDOMEN - 1 VIEW COMPARISON:  04/07/2020 abdominal radiographs and prior. FINDINGS: Stable positioning of enteric tube and spinal stimulator. Enteric contrast opacifies the large bowel to the level of the rectum. Gas is seen within nondilated small bowel loops. Sequela of lumbar spinal fixation. No acute osseous abnormality. IMPRESSION: No dilated bowel loops or air-fluid levels. Enteric contrast opacifies the large bowel to level the rectum. Indwelling  enteric tube and spinal stimulator, unchanged. Electronically Signed   By: Stana Bunting M.D.   On: 04/07/2020 17:54   DG Abd Portable 1V-Small Bowel Protocol-Position Verification  Result Date: 04/07/2020 CLINICAL DATA:  Check gastric catheter placement EXAM: PORTABLE ABDOMEN - 1 VIEW COMPARISON:  04/06/2020 FINDINGS: Gastric catheter is again noted within the stomach directed towards the antrum. Spinal stimulator is seen. No free air is noted. No obstructive changes are noted. IMPRESSION: Stable gastric catheter within the stomach. Electronically Signed   By: Alcide Clever M.D.   On: 04/07/2020 08:35    Microbiology: Recent Results (from the past 240 hour(s))  SARS Coronavirus 2 by RT PCR (hospital order, performed in [facility] hospital lab) Nasopharyngeal Nasopharyngeal Swab     Status: None    Collection Time: 04/06/20  9:38 PM   Specimen: Nasopharyngeal Swab  Result Value Ref Range Status   SARS Coronavirus 2 NEGATIVE NEGATIVE Final    Comment: (NOTE) SARS-CoV-2 target nucleic acids are NOT DETECTED.  The SARS-CoV-2 RNA is generally detectable in upper and lower respiratory specimens during the acute phase of infection. The lowest concentration of SARS-CoV-2 viral copies this assay can detect is 250 copies / mL. A negative result does not preclude SARS-CoV-2 infection and should not be used as the sole basis for treatment or other patient management decisions.  A negative result may occur with improper specimen collection / handling, submission of specimen other than nasopharyngeal swab, presence of viral mutation(s) within the areas targeted by this assay, and inadequate number of viral copies (<250 copies / mL). A negative result must be combined with clinical observations, patient history, and epidemiological information.  Fact Sheet for Patients:   BoilerBrush.com.cy  Fact Sheet for Healthcare Providers: https://pope.com/  This test is not yet approved or  cleared by the [place] FDA and has been authorized for detection and/or diagnosis of SARS-CoV-2 by FDA under an Emergency Use Authorization (EUA).  This EUA will remain in effect (meaning this test can be used) for the duration of the COVID-19 declaration under Section 564(b)(1) of the Act, 21 U.S.C. section 360bbb-3(b)(1), unless the authorization is terminated or revoked sooner.  Performed at [facility], [address] [place], [place] [address]      Labs: Basic Metabolic Panel: Recent Labs  Lab 04/06/20 1906 04/07/20 0752 04/08/20 0452 04/10/20 1503 04/11/20 0516  NA 137  --  132* 141 137  K 3.7  --  3.3* 4.1 3.8  CL 97*  --  98 104 104  CO2 26  --  26 26 24   GLUCOSE 221*  --  116* 140* 147*  BUN 14  --  13 <5* <5*  CREATININE  1.10 0.89 0.92 0.76 0.86  CALCIUM 9.0  --  7.3* 8.7* 8.5*  MG  --   --   --  2.3  --    Liver Function Tests: Recent Labs  Lab 04/06/20 1906 04/10/20 1503 04/11/20 0516  AST 28 23 22   ALT 22 15 15   ALKPHOS 94 60 60  BILITOT 1.1 0.7 0.6  PROT 8.5* 7.2 6.8  ALBUMIN 4.9 3.9 3.7   Recent Labs  Lab 04/06/20 1906  LIPASE 37   No results for input(s): AMMONIA in the last 168 hours. CBC: Recent Labs  Lab 04/06/20 1906 04/07/20 0752 04/08/20 0452 04/10/20 1503 04/11/20 0516  WBC 16.3* 9.1 6.1 5.1 6.0  NEUTROABS 14.1*  --   --   --   --  HGB 17.0 16.1 13.9 14.5 14.2  HCT 55.7* 52.3* 46.1 46.8 47.1  MCV 85.0 85.5 86.0 85.7 85.9  PLT 427* 362 310 289 274   Cardiac Enzymes: No results for input(s): CKTOTAL, CKMB, CKMBINDEX, TROPONINI in the last 168 hours. BNP: BNP (last 3 results) No results for input(s): BNP in the last 8760 hours.  ProBNP (last 3 results) No results for input(s): PROBNP in the last 8760 hours.  CBG: Recent Labs  Lab 04/11/20 1550 04/11/20 1932 04/11/20 2329 04/12/20 0354 04/12/20 0742  GLUCAP 170* 167* 140* 129* 142*       Signed:  Briant Cedar, MD [facility] 04/12/2020, 11:39 AM

## 2020-04-12 NOTE — Progress Notes (Signed)
Discharge instructions given to pt and all questions were answered.  

## 2020-04-12 NOTE — Discharge Instructions (Signed)
Soft-Food Eating Plan Please follow this diet plan for 2 weeks after discharge  A soft-food eating plan includes foods that are safe and easy to chew and swallow. Your health care provider or dietitian can help you find foods and flavors that fit into this plan. Follow this plan until your health care provider or dietitian says it is safe to start eating other foods and food textures. What are tips for following this plan? General guidelines   Take small bites of food, or cut food into pieces about  inch or smaller. Bite-sized pieces of food are easier to chew and swallow.  Eat moist foods. Avoid overly dry foods.  Avoid foods that: ? Are difficult to swallow, such as dry, chunky, crispy, or sticky foods. ? Are difficult to chew, such as hard, tough, or stringy foods. ? Contain nuts, seeds, or fruits.  Follow instructions from your dietitian about the types of liquids that are safe for you to swallow. You may be allowed to have: ? Thick liquids only. This includes only liquids that are thicker than honey. ? Thin and thick liquids. This includes all beverages and foods that become liquid at room temperature.  To make thick liquids: ? Purchase a commercial liquid thickening powder. These are available at grocery stores and pharmacies. ? Mix the thickener into liquids according to instructions on the label. ? Purchase ready-made thickened liquids. ? Thicken soup by pureeing, straining to remove chunks, and adding flour, potato flakes, or corn starch. ? Add commercial thickener to foods that become liquid at room temperature, such as milk shakes, yogurt, ice cream, gelatin, and sherbet.  Ask your health care provider whether you need to take a fiber supplement. Cooking  Cook meats so they stay tender and moist. Use methods like braising, stewing, or baking in liquid.  Cook vegetables and fruit until they are soft enough to be mashed with a fork.  Peel soft, fresh fruits such as  peaches, nectarines, and melons.  When making soup, make sure chunks of meat and vegetables are smaller than  inch.  Reheat leftover foods slowly so that a tough crust does not form. What foods are allowed? The items listed below may not be a complete list. Talk with your dietitian about what dietary choices are best for you. Grains Breads, muffins, pancakes, or waffles moistened with syrup, jelly, or butter. Dry cereals well-moistened with milk. Moist, cooked cereals. Well-cooked pasta and rice. Vegetables All soft-cooked vegetables. Shredded lettuce. Fruits All canned and cooked fruits. Soft, peeled fresh fruits. Strawberries. Dairy Milk. Cream. Yogurt. Cottage cheese. Soft cheese without the rind. Meats and other protein foods Tender, moist ground meat, poultry, or fish. Meat cooked in gravy or sauces. Eggs. Sweets and desserts Ice cream. Milk shakes. Sherbet. Pudding. Fats and oils Butter. Margarine. Olive, canola, sunflower, and grapeseed oil. Smooth salad dressing. Smooth cream cheese. Mayonnaise. Gravy. What foods are not allowed? The items listed bemay not be a complete list. Talk with your dietitian about what dietary choices are best for you. Grains Coarse or dry cereals, such as bran, granola, and shredded wheat. Tough or chewy crusty breads, such as [place] bread or baguettes. Breads with nuts, seeds, or fruit. Vegetables All raw vegetables. Cooked corn. Cooked vegetables that are tough or stringy. Tough, crisp, fried potatoes and potato skins. Fruits Fresh fruits with skins or seeds, or both, such as apples, pears, and grapes. Stringy, high-pulp fruits, such as papaya, pineapple, coconut, and mango. Fruit leather and all dried fruit. Dairy  Yogurt with nuts or coconut. Meats and other protein foods Hard, dry sausages. Dry meat, poultry, or fish. Meats with gristle. Fish with bones. Fried meat or fish. Lunch meat and hotdogs. Nuts and seeds. Chunky peanut butter or other  nut butters. Sweets and desserts Cakes or cookies that are very dry or chewy. Desserts with dried fruit, nuts, or coconut. Fried pastries. Very rich pastries. Fats and oils Cream cheese with fruit or nuts. Salad dressings with seeds or chunks. Summary  A soft-food eating plan includes foods that are safe and easy to swallow. Generally, the foods should be soft enough to be mashed with a fork.  Avoid foods that are dry, hard to chew, crunchy, sticky, stringy, or crispy.  Ask your health care provider whether you need to thicken your liquids and if you need to take a fiber supplement. This information is not intended to replace advice given to you by your health care provider. Make sure you discuss any questions you have with your health care provider. Document Revised: 12/03/2018 Document Reviewed: 10/15/2016 Elsevier Patient Education  2020 Elsevier Inc.   High-Fiber Diet You can follow this diet starting 8/31 Fiber, also called dietary fiber, is a type of carbohydrate that is found in fruits, vegetables, whole grains, and beans. A high-fiber diet can have many health benefits. Your health care provider may recommend a high-fiber diet to help:  Prevent constipation. Fiber can make your bowel movements more regular.  Lower your cholesterol.  Relieve the following conditions: ? Swelling of veins in the anus (hemorrhoids). ? Swelling and irritation (inflammation) of specific areas of the digestive tract (uncomplicated diverticulosis). ? A problem of the large intestine (colon) that sometimes causes pain and diarrhea (irritable bowel syndrome, IBS).  Prevent overeating as part of a weight-loss plan.  Prevent heart disease, type 2 diabetes, and certain cancers. What is my plan? The recommended daily fiber intake in grams (g) includes:  38 g for men age 64 or younger.  30 g for men over age 350.  25 g for women age 64 or younger.  21 g for women over age 64. You can get the  recommended daily intake of dietary fiber by:  Eating a variety of fruits, vegetables, grains, and beans.  Taking a fiber supplement, if it is not possible to get enough fiber through your diet. What do I need to know about a high-fiber diet?  It is better to get fiber through food sources rather than from fiber supplements. There is not a lot of research about how effective supplements are.  Always check the fiber content on the nutrition facts label of any prepackaged food. Look for foods that contain 5 g of fiber or more per serving.  Talk with a diet and nutrition specialist (dietitian) if you have questions about specific foods that are recommended or not recommended for your medical condition, especially if those foods are not listed below.  Gradually increase how much fiber you consume. If you increase your intake of dietary fiber too quickly, you may have bloating, cramping, or gas.  Drink plenty of water. Water helps you to digest fiber. What are tips for following this plan?  Eat a wide variety of high-fiber foods.  Make sure that half of the grains that you eat each day are whole grains.  Eat breads and cereals that are made with whole-grain flour instead of refined flour or white flour.  Eat brown rice, bulgur wheat, or millet instead of white rice.  Start the day with a breakfast that is high in fiber, such as a cereal that contains 5 g of fiber or more per serving.  Use beans in place of meat in soups, salads, and pasta dishes.  Eat high-fiber snacks, such as berries, raw vegetables, nuts, and popcorn.  Choose whole fruits and vegetables instead of processed forms like juice or sauce. What foods can I eat?  Fruits Berries. Pears. Apples. Oranges. Avocado. Prunes and raisins. Dried figs. Vegetables Sweet potatoes. Spinach. Kale. Artichokes. Cabbage. Broccoli. Cauliflower. Green peas. Carrots. Squash. Grains Whole-grain breads. Multigrain cereal. Oats and oatmeal.  Brown rice. Barley. Bulgur wheat. Millet. Quinoa. Bran muffins. Popcorn. Rye wafer crackers. Meats and other proteins Navy, kidney, and pinto beans. Soybeans. Split peas. Lentils. Nuts and seeds. Dairy Fiber-fortified yogurt. Beverages Fiber-fortified soy milk. Fiber-fortified orange juice. Other foods Fiber bars. The items listed above may not be a complete list of recommended foods and beverages. Contact a dietitian for more options. What foods are not recommended? Fruits Fruit juice. Cooked, strained fruit. Vegetables Fried potatoes. Canned vegetables. Well-cooked vegetables. Grains White bread. Pasta made with refined flour. White rice. Meats and other proteins Fatty cuts of meat. Fried chicken or fried fish. Dairy Milk. Yogurt. Cream cheese. Sour cream. Fats and oils Butters. Beverages Soft drinks. Other foods Cakes and pastries. The items listed above may not be a complete list of foods and beverages to avoid. Contact a dietitian for more information. Summary  Fiber is a type of carbohydrate. It is found in fruits, vegetables, whole grains, and beans.  There are many health benefits of eating a high-fiber diet, such as preventing constipation, lowering blood cholesterol, helping with weight loss, and reducing your risk of heart disease, diabetes, and certain cancers.  Gradually increase your intake of fiber. Increasing too fast can result in cramping, bloating, and gas. Drink plenty of water while you increase your fiber.  The best sources of fiber include whole fruits and vegetables, whole grains, nuts, seeds, and beans. This information is not intended to replace advice given to you by your health care provider. Make sure you discuss any questions you have with your health care provider. Document Revised: 06/16/2017 Document Reviewed: 06/16/2017 Elsevier Patient Education  2020 Elsevier Inc.    Bowel Obstruction A bowel obstruction means that something is blocking  the small or large bowel. The bowel is also called the intestine. It is the long tube that connects the stomach to the opening of the butt (anus). When something blocks the bowel, food and fluids cannot pass through like normal. This condition needs to be treated. Treatment depends on the cause of the problem and how bad the problem is. What are the causes? Common causes of this condition include:  Scar tissue (adhesions) from past surgery or from high-energy X-rays (radiation).  Recent surgery in the belly. This affects how food moves in the bowel.  Some diseases, such as: ? Irritation of the lining of the digestive tract (Crohn's disease). ? Irritation of small pouches in the bowel (diverticulitis).  Growths or tumors.  A bulging organ (hernia).  Twisting of the bowel (volvulus).  A foreign body.  Slipping of a part of the bowel into another part (intussusception). What are the signs or symptoms? Symptoms of this condition include:  Pain in the belly.  Feeling sick to your stomach (nauseous).  Throwing up (vomiting).  Bloating in the belly.  Being unable to pass gas.  Trouble pooping (constipation).  Watery poop (diarrhea).  A  lot of belching. How is this diagnosed? This condition may be diagnosed based on:  A physical exam.  Medical history.  Imaging tests, such as X-ray or CT scan.  Blood tests.  Urine tests. How is this treated? Treatment for this condition may include:  Fluids and pain medicines that are given through an IV tube. Your doctor may tell you not to eat or drink if you feel sick to your stomach and are throwing up.  Eating a clear liquid diet for a few days.  Putting a small tube (nasogastric tube) into the stomach. This will help with pain, discomfort, and nausea by removing blocked air and fluids from the stomach.  Surgery. This may be needed if other treatments do not work. Follow these instructions at home: Medicines  Take  over-the-counter and prescription medicines only as told by your doctor.  If you were prescribed an antibiotic medicine, take it as told by your doctor. Do not stop taking the antibiotic even if you start to feel better. General instructions  Follow your diet as told by your doctor. You may need to: ? Only drink clear liquids until you start to get better. ? Avoid solid foods.  Return to your normal activities as told by your doctor. Ask your doctor what activities are safe for you.  Do not sit for a long time without moving. Get up to take short walks every 1-2 hours. This is important. Ask for help if you feel weak or unsteady.  Keep all follow-up visits as told by your doctor. This is important. How is this prevented? After having a bowel obstruction, you may be more likely to have another. You can do some things to stop it from happening again.  If you have a long-term (chronic) disease, contact your doctor if you see changes or problems.  Take steps to prevent or treat trouble pooping. Your doctor may ask that you: ? Drink enough fluid to keep your pee (urine) pale yellow. ? Take over-the-counter or prescription medicines. ? Eat foods that are high in fiber. These include beans, whole grains, and fresh fruits and vegetables. ? Limit foods that are high in fat and sugar. These include fried or sweet foods.  Stay active. Ask your doctor which exercises are safe for you.  Avoid stress.  Eat three small meals and three small snacks each day.  Work with a Psychologist, prison and probation services (dietitian) to make a meal plan that works for you.  Do not use any products that contain nicotine or tobacco, such as cigarettes and e-cigarettes. If you need help quitting, ask your doctor. Contact a doctor if:  You have a fever.  You have chills. Get help right away if:  You have pain or cramps that get worse.  You throw up blood.  You are sick to your stomach.  You cannot stop throwing up.  You cannot  drink fluids.  You feel mixed up (confused).  You feel very thirsty (dehydrated).  Your belly gets more bloated.  You feel weak or you pass out (faint). Summary  A bowel obstruction means that something is blocking the small or large bowel.  Treatment may include IV fluids and pain medicine. You may also have a clear liquid diet, a small tube in your stomach, or surgery.  Drink clear liquids and avoid solid foods until you get better. This information is not intended to replace advice given to you by your health care provider. Make sure you discuss any questions you have  with your health care provider. Document Revised: 12/24/2017 Document Reviewed: 12/24/2017 Elsevier Patient Education  2020 [employer].

## 2020-04-14 ENCOUNTER — Other Ambulatory Visit: Payer: Self-pay

## 2020-04-14 ENCOUNTER — Emergency Department (HOSPITAL_COMMUNITY): Payer: Medicare Other

## 2020-04-14 ENCOUNTER — Inpatient Hospital Stay (HOSPITAL_COMMUNITY)
Admission: EM | Admit: 2020-04-14 | Discharge: 2020-04-17 | DRG: 392 | Disposition: A | Discharge status: Home / Self Care | Payer: Medicare Other | Attending: Internal Medicine | Admitting: Internal Medicine

## 2020-04-14 ENCOUNTER — Encounter (HOSPITAL_COMMUNITY): Payer: Self-pay | Admitting: Emergency Medicine

## 2020-04-14 DIAGNOSIS — F419 Anxiety disorder, unspecified: Secondary | ICD-10-CM | POA: Diagnosis present

## 2020-04-14 DIAGNOSIS — R112 Nausea with vomiting, unspecified: Principal | ICD-10-CM | POA: Diagnosis present

## 2020-04-14 DIAGNOSIS — Z794 Long term (current) use of insulin: Secondary | ICD-10-CM

## 2020-04-14 DIAGNOSIS — E1169 Type 2 diabetes mellitus with other specified complication: Secondary | ICD-10-CM

## 2020-04-14 DIAGNOSIS — Z7982 Long term (current) use of aspirin: Secondary | ICD-10-CM

## 2020-04-14 DIAGNOSIS — E785 Hyperlipidemia, unspecified: Secondary | ICD-10-CM | POA: Diagnosis present

## 2020-04-14 DIAGNOSIS — Z9049 Acquired absence of other specified parts of digestive tract: Secondary | ICD-10-CM

## 2020-04-14 DIAGNOSIS — I10 Essential (primary) hypertension: Secondary | ICD-10-CM

## 2020-04-14 DIAGNOSIS — Z87891 Personal history of nicotine dependence: Secondary | ICD-10-CM

## 2020-04-14 DIAGNOSIS — Z20822 Contact with and (suspected) exposure to covid-19: Secondary | ICD-10-CM | POA: Diagnosis present

## 2020-04-14 DIAGNOSIS — E119 Type 2 diabetes mellitus without complications: Secondary | ICD-10-CM

## 2020-04-14 DIAGNOSIS — E1159 Type 2 diabetes mellitus with other circulatory complications: Secondary | ICD-10-CM

## 2020-04-14 DIAGNOSIS — R140 Abdominal distension (gaseous): Secondary | ICD-10-CM

## 2020-04-14 DIAGNOSIS — F329 Major depressive disorder, single episode, unspecified: Secondary | ICD-10-CM | POA: Diagnosis present

## 2020-04-14 DIAGNOSIS — K529 Noninfective gastroenteritis and colitis, unspecified: Secondary | ICD-10-CM | POA: Diagnosis present

## 2020-04-14 DIAGNOSIS — Z888 Allergy status to other drugs, medicaments and biological substances status: Secondary | ICD-10-CM

## 2020-04-14 DIAGNOSIS — Z79899 Other long term (current) drug therapy: Secondary | ICD-10-CM

## 2020-04-14 DIAGNOSIS — K3184 Gastroparesis: Secondary | ICD-10-CM | POA: Diagnosis present

## 2020-04-14 DIAGNOSIS — I152 Hypertension secondary to endocrine disorders: Secondary | ICD-10-CM | POA: Diagnosis present

## 2020-04-14 DIAGNOSIS — R109 Unspecified abdominal pain: Secondary | ICD-10-CM

## 2020-04-14 DIAGNOSIS — Z91040 Latex allergy status: Secondary | ICD-10-CM

## 2020-04-14 DIAGNOSIS — E1143 Type 2 diabetes mellitus with diabetic autonomic (poly)neuropathy: Secondary | ICD-10-CM | POA: Diagnosis present

## 2020-04-14 DIAGNOSIS — N400 Enlarged prostate without lower urinary tract symptoms: Secondary | ICD-10-CM | POA: Diagnosis present

## 2020-04-14 HISTORY — DX: Unspecified intestinal obstruction, unspecified as to partial versus complete obstruction: K56.609

## 2020-04-14 LAB — CBC
HCT: 53.6 % — ABNORMAL HIGH (ref 39.0–52.0)
Hemoglobin: 16.5 g/dL (ref 13.0–17.0)
MCH: 25.7 pg — ABNORMAL LOW (ref 26.0–34.0)
MCHC: 30.8 g/dL (ref 30.0–36.0)
MCV: 83.6 fL (ref 80.0–100.0)
Platelets: 351 10*3/uL (ref 150–400)
RBC: 6.41 MIL/uL — ABNORMAL HIGH (ref 4.22–5.81)
RDW: 16 % — ABNORMAL HIGH (ref 11.5–15.5)
WBC: 12.4 10*3/uL — ABNORMAL HIGH (ref 4.0–10.5)
nRBC: 0 % (ref 0.0–0.2)

## 2020-04-14 LAB — COMPREHENSIVE METABOLIC PANEL
ALT: 17 U/L (ref 0–44)
AST: 28 U/L (ref 15–41)
Albumin: 4.1 g/dL (ref 3.5–5.0)
Alkaline Phosphatase: 70 U/L (ref 38–126)
Anion gap: 13 (ref 5–15)
BUN: 10 mg/dL (ref 8–23)
CO2: 23 mmol/L (ref 22–32)
Calcium: 8.8 mg/dL — ABNORMAL LOW (ref 8.9–10.3)
Chloride: 102 mmol/L (ref 98–111)
Creatinine, Ser: 0.72 mg/dL (ref 0.61–1.24)
GFR calc Af Amer: >60 mL/min (ref >60)
GFR calc non Af Amer: >60 mL/min (ref >60)
Glucose, Bld: 215 mg/dL — ABNORMAL HIGH (ref 70–99)
Potassium: 4.3 mmol/L (ref 3.5–5.1)
Sodium: 138 mmol/L (ref 135–145)
Total Bilirubin: 0.9 mg/dL (ref 0.3–1.2)
Total Protein: 8 g/dL (ref 6.5–8.1)

## 2020-04-14 LAB — URINALYSIS, ROUTINE W REFLEX MICROSCOPIC
Bacteria, UA: NONE SEEN
Bilirubin Urine: NEGATIVE
Glucose, UA: 50 mg/dL — AB
Hgb urine dipstick: NEGATIVE
Ketones, ur: NEGATIVE mg/dL
Leukocytes,Ua: NEGATIVE
Nitrite: NEGATIVE
Protein, ur: 30 mg/dL — AB
Specific Gravity, Urine: 1.014 (ref 1.005–1.030)
pH: 5 (ref 5.0–8.0)

## 2020-04-14 LAB — LIPASE, BLOOD: Lipase: 50 U/L (ref 11–51)

## 2020-04-14 LAB — SARS CORONAVIRUS 2 BY RT PCR (HOSPITAL ORDER, PERFORMED IN ~~LOC~~ HOSPITAL LAB): SARS Coronavirus 2: NEGATIVE

## 2020-04-14 LAB — GLUCOSE, CAPILLARY: Glucose-Capillary: 131 mg/dL — ABNORMAL HIGH (ref 70–99)

## 2020-04-14 LAB — LACTIC ACID, PLASMA: Lactic Acid, Venous: 1.3 mmol/L (ref 0.5–1.9)

## 2020-04-14 MED ORDER — LOVASTATIN ER 40 MG PO TB24: 40.0000 mg | ORAL_TABLET | Freq: Every day | ORAL | Status: DC

## 2020-04-14 MED ORDER — INSULIN DEGLUDEC 100 UNIT/ML ~~LOC~~ SOPN: 30.0000 [IU] | PEN_INJECTOR | Freq: Every day | SUBCUTANEOUS | Status: DC

## 2020-04-14 MED ORDER — METOCLOPRAMIDE HCL 5 MG/ML IJ SOLN
10.0000 mg | Freq: Three times a day (TID) | INTRAMUSCULAR | Status: DC
  Administered 2020-04-15 – 2020-04-17 (×7): 10 mg via INTRAVENOUS
  Filled 2020-04-14 (×7): qty 2

## 2020-04-14 MED ORDER — METOCLOPRAMIDE HCL 5 MG/ML IJ SOLN
5.0000 mg | Freq: Once | INTRAMUSCULAR | Status: AC
  Administered 2020-04-14: 5 mg via INTRAVENOUS
  Filled 2020-04-14: qty 2

## 2020-04-14 MED ORDER — INSULIN ASPART 100 UNIT/ML ~~LOC~~ SOLN
0.0000 [IU] | SUBCUTANEOUS | Status: DC
  Administered 2020-04-14 – 2020-04-15 (×2): 1 [IU] via SUBCUTANEOUS
  Administered 2020-04-15: 2 [IU] via SUBCUTANEOUS
  Administered 2020-04-15 (×3): 1 [IU] via SUBCUTANEOUS
  Administered 2020-04-16: 2 [IU] via SUBCUTANEOUS
  Administered 2020-04-16: 1 [IU] via SUBCUTANEOUS

## 2020-04-14 MED ORDER — INSULIN GLARGINE 100 UNIT/ML ~~LOC~~ SOLN
30.0000 [IU] | Freq: Every day | SUBCUTANEOUS | Status: DC
  Administered 2020-04-14 – 2020-04-16 (×3): 30 [IU] via SUBCUTANEOUS
  Filled 2020-04-14 (×3): qty 0.3

## 2020-04-14 MED ORDER — MORPHINE SULFATE (PF) 4 MG/ML IV SOLN
6.0000 mg | Freq: Once | INTRAVENOUS | Status: AC
  Administered 2020-04-14: 6 mg via INTRAVENOUS
  Filled 2020-04-14: qty 2

## 2020-04-14 MED ORDER — ONDANSETRON HCL 4 MG PO TABS
4.0000 mg | ORAL_TABLET | Freq: Four times a day (QID) | ORAL | Status: DC | PRN
  Administered 2020-04-16: 4 mg via ORAL
  Filled 2020-04-14: qty 1

## 2020-04-14 MED ORDER — ACETAMINOPHEN 650 MG RE SUPP: 650.0000 mg | Freq: Four times a day (QID) | RECTAL | Status: DC | PRN

## 2020-04-14 MED ORDER — TAMSULOSIN HCL 0.4 MG PO CAPS
0.4000 mg | ORAL_CAPSULE | Freq: Every day | ORAL | Status: DC
  Administered 2020-04-15 – 2020-04-17 (×3): 0.4 mg via ORAL
  Filled 2020-04-14 (×3): qty 1

## 2020-04-14 MED ORDER — LABETALOL HCL 5 MG/ML IV SOLN
10.0000 mg | INTRAVENOUS | Status: DC | PRN
  Filled 2020-04-14: qty 4

## 2020-04-14 MED ORDER — SODIUM CHLORIDE 0.9 % IV SOLN: INTRAVENOUS | Status: DC

## 2020-04-14 MED ORDER — PANTOPRAZOLE SODIUM 40 MG IV SOLR
40.0000 mg | Freq: Once | INTRAVENOUS | Status: AC
  Administered 2020-04-14: 40 mg via INTRAVENOUS
  Filled 2020-04-14: qty 40

## 2020-04-14 MED ORDER — LACTATED RINGERS IV SOLN: INTRAVENOUS | Status: AC

## 2020-04-14 MED ORDER — ONDANSETRON HCL 4 MG/2ML IJ SOLN
4.0000 mg | Freq: Four times a day (QID) | INTRAMUSCULAR | Status: DC | PRN
  Administered 2020-04-14 – 2020-04-15 (×3): 4 mg via INTRAVENOUS
  Filled 2020-04-14 (×3): qty 2

## 2020-04-14 MED ORDER — SODIUM CHLORIDE 0.9 % IV BOLUS
2000.0000 mL | Freq: Once | INTRAVENOUS | Status: AC
  Administered 2020-04-14: 2000 mL via INTRAVENOUS

## 2020-04-14 MED ORDER — OXYCODONE HCL 5 MG PO TABS
5.0000 mg | ORAL_TABLET | ORAL | Status: DC | PRN
  Administered 2020-04-15 (×3): 5 mg via ORAL
  Filled 2020-04-14 (×3): qty 1

## 2020-04-14 MED ORDER — IOHEXOL 9 MG/ML PO SOLN
ORAL | Status: AC
  Filled 2020-04-14: qty 500

## 2020-04-14 MED ORDER — HYDROMORPHONE HCL 1 MG/ML IJ SOLN
0.5000 mg | Freq: Once | INTRAMUSCULAR | Status: AC
  Administered 2020-04-14: 0.5 mg via INTRAVENOUS
  Filled 2020-04-14: qty 1

## 2020-04-14 MED ORDER — ACETAMINOPHEN 325 MG PO TABS
650.0000 mg | ORAL_TABLET | Freq: Four times a day (QID) | ORAL | Status: DC | PRN
  Filled 2020-04-14: qty 2

## 2020-04-14 MED ORDER — ENOXAPARIN SODIUM 40 MG/0.4ML ~~LOC~~ SOLN
40.0000 mg | SUBCUTANEOUS | Status: DC
  Administered 2020-04-14 – 2020-04-16 (×3): 40 mg via SUBCUTANEOUS
  Filled 2020-04-14 (×3): qty 0.4

## 2020-04-14 MED ORDER — HYDROMORPHONE HCL 1 MG/ML IJ SOLN
0.5000 mg | INTRAMUSCULAR | Status: DC | PRN
  Administered 2020-04-14: 0.5 mg via INTRAVENOUS
  Filled 2020-04-14: qty 0.5

## 2020-04-14 NOTE — Progress Notes (Signed)
Pt noted to have irregular breathing pattern while sleeping. Pt states he has sleep apnea. RN asked if pt would like CPAP and pt states he is supposed to wear one at home but does not, he does not like the Marycatherine Maniscalco it feels. Pt refuses a CPAP at this time. O2 sats in high 90s. Pt in no distress. Will continue to monitor.

## 2020-04-14 NOTE — Progress Notes (Signed)
Pt arrived to [facility] 1302

## 2020-04-14 NOTE — ED Triage Notes (Addendum)
Per EMS-states recently discharged from hospital for SBO-states has not gotten better-abdominal distention, N/V/D-abdominal pain-4 mg of Zofran given in route

## 2020-04-14 NOTE — ED Provider Notes (Signed)
[facility]-EMERGENCY DEPT Provider Note   CSN: [id number] Arrival date & time: 04/14/20  1225     History Chief Complaint  Patient presents with  . Abdominal Pain    Brandon [name] is a 64 y.o. male.  64 year old male who was recently discharged from hospital after admission for small bowel obstruction presents with over 24 hours of diffuse abdominal pain with distention.  States that he has had nonbilious emesis.  Has had some watery diarrhea.  No fever or chills.  Endorses diffuse abdominal discomfort without urinary symptoms.  Unresponsive to home medications.  Patient has a history also of diabetic gastroparesis although he has never been admitted for this.        Past Medical History:  Diagnosis Date  . Anxiety   . Depression   . Diabetes mellitus without complication (HCC)   . Hypertension   . SBO (small bowel obstruction) [facility])     Patient Active Problem List   Diagnosis Date Noted  . Encounter for imaging study to confirm nasogastric (NG) tube placement   . Essential hypertension 04/07/2020  . Diabetes mellitus type 2, controlled, without complications (HCC) 04/07/2020  . SBO (small bowel obstruction) (HCC) 04/06/2020    Past Surgical History:  Procedure Laterality Date  . BACK SURGERY    . CHOLECYSTECTOMY    . EYE SURGERY         Family History  Problem Relation Age of Onset  . COPD Mother   . Cancer Father     Social History   Tobacco Use  . Smoking status: Former Games developer  . Smokeless tobacco: Never Used  Substance Use Topics  . Alcohol use: Not Currently  . Drug use: Not Currently    Home Medications Prior to Admission medications   Medication Sig Start Date End Date Taking? Authorizing Provider  amLODipine (NORVASC) 10 MG tablet Take 10 mg by mouth daily.   Yes [provider]  ARIPiprazole (ABILIFY) 10 MG tablet Take 10 mg by mouth daily.   Yes [provider]  aspirin 81 MG chewable tablet Chew  81 mg by mouth daily.   Yes [provider]  empagliflozin (JARDIANCE) 25 MG TABS tablet Take 25 mg by mouth daily.   Yes [provider]  Insulin Degludec (TRESIBA) 100 UNIT/ML SOLN Inject 60 Units into the skin.   Yes [provider]  insulin lispro (HUMALOG) 100 UNIT/ML injection Inject 0-10 Units into the skin 3 (three) times daily before meals. Sliding Scale   Yes [provider]  lovastatin (ALTOPREV) 40 MG 24 hr tablet Take 40 mg by mouth at bedtime.   Yes [provider]  pantoprazole (PROTONIX) 40 MG tablet Take 40 mg by mouth daily.   Yes [provider]  pioglitazone (ACTOS) 30 MG tablet Take 30 mg by mouth daily.   Yes [provider]  tamsulosin (FLOMAX) 0.4 MG CAPS capsule Take 0.4 mg by mouth.   Yes [provider]    Allergies    Ancef [cefazolin], Latex, and Levaquin [levofloxacin]  Review of Systems   Review of Systems  All other systems reviewed and are negative.   Physical Exam Updated Vital Signs BP (!) 148/91 (BP Location: Left Arm)   Pulse (!) 104   Temp 98.3 F (36.8 C) (Oral)   Resp 18   SpO2 97%   Physical Exam Vitals and nursing note reviewed.  Constitutional:      General: He is not in acute distress.  Appearance: Normal appearance. He is well-developed. He is not toxic-appearing.  HENT:     Head: Normocephalic and atraumatic.  Eyes:     General: Lids are normal.     Conjunctiva/sclera: Conjunctivae normal.     Pupils: Pupils are equal, round, and reactive to light.  Neck:     Thyroid: No thyroid mass.     Trachea: No tracheal deviation.  Cardiovascular:     Rate and Rhythm: Regular rhythm. Tachycardia present.     Heart sounds: Normal heart sounds. No murmur heard.  No gallop.   Pulmonary:     Effort: Pulmonary effort is normal. No respiratory distress.     Breath sounds: Normal breath sounds. No stridor. No decreased breath sounds, wheezing, rhonchi or rales.    Abdominal:     General: Bowel sounds are normal. There is no distension.     Palpations: Abdomen is soft.     Tenderness: There is generalized abdominal tenderness. There is no guarding or rebound.  Musculoskeletal:        General: No tenderness. Normal range of motion.     Cervical back: Normal range of motion and neck supple.  Skin:    General: Skin is warm and dry.     Findings: No abrasion or rash.  Neurological:     Mental Status: He is alert and oriented to person, place, and time.     GCS: GCS eye subscore is 4. GCS verbal subscore is 5. GCS motor subscore is 6.     Cranial Nerves: No cranial nerve deficit.     Sensory: No sensory deficit.  Psychiatric:        Speech: Speech normal.        Behavior: Behavior normal.     ED Results / Procedures / Treatments   Labs (all labs ordered are listed, but only abnormal results are displayed) Labs Reviewed  COMPREHENSIVE METABOLIC PANEL - Abnormal; Notable for the following components:      Result Value   Glucose, Bld 215 (*)    Calcium 8.8 (*)    All other components within normal limits  CBC - Abnormal; Notable for the following components:   WBC 12.4 (*)    RBC 6.41 (*)    HCT 53.6 (*)    MCH 25.7 (*)    RDW 16.0 (*)    All other components within normal limits  SARS CORONAVIRUS 2 BY RT PCR (HOSPITAL ORDER, PERFORMED IN [facility])  LIPASE, BLOOD  URINALYSIS, ROUTINE W REFLEX MICROSCOPIC    EKG None  Radiology CT Abdomen Pelvis Wo Contrast  Result Date: 04/14/2020 CLINICAL DATA:  Nausea, vomiting, diarrhea and abdominal pain. EXAM: CT ABDOMEN AND PELVIS WITHOUT CONTRAST TECHNIQUE: Multidetector CT imaging of the abdomen and pelvis was performed following the standard protocol without IV contrast. COMPARISON:  April 06, 2020 FINDINGS: Lower chest: Mild linear scarring and/or atelectasis is seen along the posterolateral aspect of the left lung base. Hepatobiliary: No focal liver abnormality is seen.  Status post cholecystectomy. No biliary dilatation. Pancreas: Unremarkable. No pancreatic ductal dilatation or surrounding inflammatory changes. Spleen: Punctate calcified granulomas are seen scattered throughout an otherwise normal-appearing spleen. Adrenals/Urinary Tract: Adrenal glands are unremarkable. Kidneys are normal in size, without renal calculi or hydronephrosis. A 3.2 cm x 2.1 cm cyst is seen within the anterolateral aspect of the mid right kidney. Bladder is unremarkable. Stomach/Bowel: Stomach is within normal limits. Appendix appears normal. No evidence of bowel wall thickening, distention, or inflammatory changes.  Noninflamed diverticula are seen throughout the descending and sigmoid colon. Vascular/Lymphatic: There is moderate severity calcification of the abdominal aorta and bilateral common iliac arteries, without evidence of aneurysmal dilatation. No enlarged abdominal or pelvic lymph nodes. Reproductive: The prostate gland is moderately enlarged. A penile pump and associated reservoir seen. Other: No abdominal wall hernia or abnormality. No abdominopelvic ascites. Musculoskeletal: A metallic density stimulator and associated spinal stimulator wire are seen posterior pelvic wall on the left. Bilateral metallic density pedicle screws are seen at the level of the L4 and L5 vertebral bodies. IMPRESSION: 1. Noninflamed diverticula throughout the descending and sigmoid colon. 2. Evidence of prior cholecystectomy. 3. Postoperative changes within the lower lumbar spine. 4. Enlarged prostate gland. Aortic Atherosclerosis (ICD10-I70.0). Electronically Signed   By: Aram Candela M.D.   On: 04/14/2020 15:27    Procedures Procedures (including critical care time)  Medications Ordered in ED Medications  iohexol (OMNIPAQUE) 9 MG/ML oral solution (has no administration in time range)  morphine 4 MG/ML injection 6 mg (has no administration in time range)  metoCLOPramide (REGLAN) injection 5 mg (has  no administration in time range)  pantoprazole (PROTONIX) injection 40 mg (has no administration in time range)  sodium chloride 0.9 % bolus 2,000 mL (has no administration in time range)  0.9 %  sodium chloride infusion (has no administration in time range)    ED Course  I have reviewed the triage vital signs and the nursing notes.  Pertinent labs & imaging results that were available during my care of the patient were reviewed by me and considered in my medical decision making (see chart for details).    MDM Rules/Calculators/A&P                          Patient given IV fluids here along with antiemetics and analgesics and continues to note emesis with diffuse crampy abdominal pain.  Likely related to diabetic gastroparesis as his abdominal CT was negative.  Mild leukocytosis noted on his CBC and lipase normal.  Will admit to the hospital Final Clinical Impression(s) / ED Diagnoses Final diagnoses:  None    Rx / DC Orders ED Discharge Orders    None       Lorre Nick, MD 04/14/20 [phone]

## 2020-04-14 NOTE — ED Notes (Signed)
ED Provider at bedside. 

## 2020-04-14 NOTE — H&P (Addendum)
History and Physical    Brandon [name] OAC:[id number] DOB: February 19, 1956 DOA: 04/14/2020  PCP: Patient, No Pcp Per  Patient coming from: Home  I have personally briefly reviewed patient's old medical records in [facility]  Chief Complaint: Nausea/vomiting/abdominal pain  HPI: Brandon [name] is a 64 y.o. male with medical history significant for T2DM, HTN, HLD, BPH, depression/anxiety, gangrenous cholecystitis February 2020 in [place], and recent admission for SBO who presents to the ED for evaluation of recurrent nausea, vomiting, abdominal pain.  Patient was just hospitalized 04/06/2020-04/12/2020 for small bowel obstruction.  He was followed by general surgery and managed with NG tube decompression, IV fluids, and pain control.  Patient states he felt well on discharge.  Yesterday he had some nausea but otherwise no other symptoms.  This morning he had his first full meal for breakfast since discharge consisting of eggs and bacon.  Shortly afterwards he developed recurrent nausea, vomiting, abdominal pain, and watery diarrhea.  He tried reducing his diet to clear liquids but was unable to tolerate it.  He has not seen any bloody or coffee-ground emesis.  He has not seen any hematochezia or melena.  He reports some abdominal distention and bloating.  He says he had several episodes of diarrhea at home and one large bowel movement while waiting in the ED.  He denies any subjective fevers but has chills during episodes of emesis.  He denies any chest pain, dyspnea, dysuria.  He says he moved back to [place] from [place] 03/26/2020 and has been under significant stress since then as he is living in a hotel while searching for more long-term residence.  ED Course:  Initial vitals showed BP 140/86, pulse 121, RR 18, temp 100.2 Fahrenheit, SPO2 91% on room air.  WBC 12.4, hemoglobin 16.5, platelets 351,000, sodium 138, potassium 4.3, bicarb 23, BUN 10, creatinine 0.72, serum glucose 215, LFTs  within normal limits, lipase 50.  SARS-CoV-2 PCR is negative.  CT abdomen/pelvis without contrast shows noninflamed diverticula throughout the descending and sigmoid colon, evidence of prior cholecystectomy, postop changes within the lower lumbar spine, and enlarged prostate gland.  Stomach appears within normal limits without evidence of bowel wall thickening, distention, or inflammatory changes.  Patient was given IV morphine, Dilaudid, Reglan x2, Protonix, and 2 L normal saline.  The hospitalist service was consulted to admit for further evaluation and management.  Review of Systems: All systems reviewed and are negative except as documented in history of present illness above.   Past Medical History:  Diagnosis Date  . Anxiety   . Depression   . Diabetes mellitus without complication (HCC)   . Hypertension   . SBO (small bowel obstruction) (HCC)     Past Surgical History:  Procedure Laterality Date  . BACK SURGERY    . CHOLECYSTECTOMY    . EYE SURGERY      Social History:  reports that he has quit smoking. He has never used smokeless tobacco. He reports previous alcohol use. He reports previous drug use.  Allergies  Allergen Reactions  . Ancef [Cefazolin] Rash  . Latex Rash  . Levaquin [Levofloxacin] Rash    Family History  Problem Relation Age of Onset  . COPD Mother   . Cancer Father      Prior to Admission medications   Medication Sig Start Date End Date Taking? Authorizing Provider  amLODipine (NORVASC) 10 MG tablet Take 10 mg by mouth daily.   Yes [provider]  ARIPiprazole (ABILIFY) 10 MG  tablet Take 10 mg by mouth daily.   Yes [provider]  aspirin 81 MG chewable tablet Chew 81 mg by mouth daily.   Yes [provider]  empagliflozin (JARDIANCE) 25 MG TABS tablet Take 25 mg by mouth daily.   Yes [provider]  Insulin Degludec (TRESIBA) 100 UNIT/ML SOLN Inject 60 Units into the skin.   Yes [provider]   insulin lispro (HUMALOG) 100 UNIT/ML injection Inject 0-10 Units into the skin 3 (three) times daily before meals. Sliding Scale   Yes [provider]  lovastatin (ALTOPREV) 40 MG 24 hr tablet Take 40 mg by mouth at bedtime.   Yes [provider]  pantoprazole (PROTONIX) 40 MG tablet Take 40 mg by mouth daily.   Yes [provider]  pioglitazone (ACTOS) 30 MG tablet Take 30 mg by mouth daily.   Yes [provider]  tamsulosin (FLOMAX) 0.4 MG CAPS capsule Take 0.4 mg by mouth.   Yes [provider]    Physical Exam: Vitals:   04/14/20 1700 04/14/20 1730 04/14/20 1803 04/14/20 1830  BP: (!) 149/92 (!) 154/112 (!) 159/90 (!) 155/87  Pulse: 98 (!) 106 (!) 102 (!) 103  Resp: 17  20 19   Temp:      TempSrc:      SpO2: 98% 97% 92% 99%   Constitutional: Sitting up in bed, NAD, calm, appears tired Eyes: PERRL, lids and conjunctivae normal ENMT: Mucous membranes are dry. Posterior pharynx clear of any exudate or lesions.Normal dentition.  Neck: normal, supple, no masses. Respiratory: clear to auscultation bilaterally, no wheezing, no crackles. Normal respiratory effort. No accessory muscle use.  Cardiovascular: Regular rate and rhythm, no murmurs / rubs / gallops. No extremity edema. 2+ pedal pulses. Abdomen: Distended slightly tense abdomen, mild tenderness LLQ when right side of abdomen, no masses palpated. Bowel sounds positive.  Musculoskeletal: no clubbing / cyanosis. No joint deformity upper and lower extremities. Good ROM, no contractures. Normal muscle tone.  Skin: Multiple well-healed laparoscopic surgical scars on the abdomen.  No rashes, lesions, ulcers. No induration Neurologic: CN 2-12 grossly intact. Sensation intact, Strength 5/5 in all 4.  Psychiatric: Normal judgment and insight. Alert and oriented x 3. Normal mood.     Labs on Admission: I have personally reviewed following labs and imaging studies  CBC: Recent Labs  Lab  04/08/20 0452 04/10/20 1503 04/11/20 0516 04/14/20 1332  WBC 6.1 5.1 6.0 12.4*  HGB 13.9 14.5 14.2 16.5  HCT 46.1 46.8 47.1 53.6*  MCV 86.0 85.7 85.9 83.6  PLT 310 289 274 351   Basic Metabolic Panel: Recent Labs  Lab 04/08/20 0452 04/10/20 1503 04/11/20 0516 04/14/20 1332  NA 132* 141 137 138  K 3.3* 4.1 3.8 4.3  CL 98 104 104 102  CO2 26 26 24 23   GLUCOSE 116* 140* 147* 215*  BUN 13 <5* <5* 10  CREATININE 0.92 0.76 0.86 0.72  CALCIUM 7.3* 8.7* 8.5* 8.8*  MG  --  2.3  --   --    GFR: Estimated Creatinine Clearance: 111.5 mL/min (by C-G formula based on SCr of 0.72 mg/dL). Liver Function Tests: Recent Labs  Lab 04/10/20 1503 04/11/20 0516 04/14/20 1332  AST 23 22 28   ALT 15 15 17   ALKPHOS 60 60 70  BILITOT 0.7 0.6 0.9  PROT 7.2 6.8 8.0  ALBUMIN 3.9 3.7 4.1   Recent Labs  Lab 04/14/20 1332  LIPASE 50   No results for input(s): AMMONIA in  the last 168 hours. Coagulation Profile: Recent Labs  Lab 04/08/20 0452  INR 1.1   Cardiac Enzymes: No results for input(s): CKTOTAL, CKMB, CKMBINDEX, TROPONINI in the last 168 hours. BNP (last 3 results) No results for input(s): PROBNP in the last 8760 hours. HbA1C: No results for input(s): HGBA1C in the last 72 hours. CBG: Recent Labs  Lab 04/11/20 1550 04/11/20 1932 04/11/20 2329 04/12/20 0354 04/12/20 0742  GLUCAP 170* 167* 140* 129* 142*   Lipid Profile: No results for input(s): CHOL, HDL, LDLCALC, TRIG, CHOLHDL, LDLDIRECT in the last 72 hours. Thyroid Function Tests: No results for input(s): TSH, T4TOTAL, FREET4, T3FREE, THYROIDAB in the last 72 hours. Anemia Panel: No results for input(s): VITAMINB12, FOLATE, FERRITIN, TIBC, IRON, RETICCTPCT in the last 72 hours. Urine analysis:    Component Value Date/Time   COLORURINE YELLOW 04/06/2020 1906   APPEARANCEUR CLEAR 04/06/2020 1906   LABSPEC >1.030 (H) 04/06/2020 1906   PHURINE 6.0 04/06/2020 1906   GLUCOSEU 100 (A) 04/06/2020 1906   HGBUR  NEGATIVE 04/06/2020 1906   BILIRUBINUR SMALL (A) 04/06/2020 1906   KETONESUR NEGATIVE 04/06/2020 1906   PROTEINUR >300 (A) 04/06/2020 1906   NITRITE NEGATIVE 04/06/2020 1906   LEUKOCYTESUR NEGATIVE 04/06/2020 1906    Radiological Exams on Admission: CT Abdomen Pelvis Wo Contrast  Result Date: 04/14/2020 CLINICAL DATA:  Nausea, vomiting, diarrhea and abdominal pain. EXAM: CT ABDOMEN AND PELVIS WITHOUT CONTRAST TECHNIQUE: Multidetector CT imaging of the abdomen and pelvis was performed following the standard protocol without IV contrast. COMPARISON:  April 06, 2020 FINDINGS: Lower chest: Mild linear scarring and/or atelectasis is seen along the posterolateral aspect of the left lung base. Hepatobiliary: No focal liver abnormality is seen. Status post cholecystectomy. No biliary dilatation. Pancreas: Unremarkable. No pancreatic ductal dilatation or surrounding inflammatory changes. Spleen: Punctate calcified granulomas are seen scattered throughout an otherwise normal-appearing spleen. Adrenals/Urinary Tract: Adrenal glands are unremarkable. Kidneys are normal in size, without renal calculi or hydronephrosis. A 3.2 cm x 2.1 cm cyst is seen within the anterolateral aspect of the mid right kidney. Bladder is unremarkable. Stomach/Bowel: Stomach is within normal limits. Appendix appears normal. No evidence of bowel wall thickening, distention, or inflammatory changes. Noninflamed diverticula are seen throughout the descending and sigmoid colon. Vascular/Lymphatic: There is moderate severity calcification of the abdominal aorta and bilateral common iliac arteries, without evidence of aneurysmal dilatation. No enlarged abdominal or pelvic lymph nodes. Reproductive: The prostate gland is moderately enlarged. A penile pump and associated reservoir seen. Other: No abdominal wall hernia or abnormality. No abdominopelvic ascites. Musculoskeletal: A metallic density stimulator and associated spinal stimulator wire are  seen posterior pelvic wall on the left. Bilateral metallic density pedicle screws are seen at the level of the L4 and L5 vertebral bodies. IMPRESSION: 1. Noninflamed diverticula throughout the descending and sigmoid colon. 2. Evidence of prior cholecystectomy. 3. Postoperative changes within the lower lumbar spine. 4. Enlarged prostate gland. Aortic Atherosclerosis (ICD10-I70.0). Electronically Signed   By: Aram Candela M.D.   On: 04/14/2020 15:27    EKG: Not performed.  Assessment/Plan Principal Problem:   Nausea and vomiting Active Problems:   Hypertension associated with diabetes (HCC)   Diabetes mellitus type 2, controlled, without complications (HCC)   Hyperlipidemia associated with type 2 diabetes mellitus (HCC)  Brandon [name] is a 64 y.o. male with medical history significant for T2DM, HTN, HLD, BPH, depression/anxiety, gangrenous cholecystitis February 2020 in [place], and recent admission for SBO who is admitted with recurrent nausea, vomiting, diarrhea, abdominal  pain.  Nausea/vomiting/diarrhea/abdominal pain, recent SBO: Recent admit 8/12-8/18 for SBO managed conservatively with NG decompression, fluids, pain control.  Did well for a day before recurrent N/V/abdominal pain and diarrhea after having breakfast morning of 04/14/2020.  Noncontrast CT A/P is without evidence of acute etiology.  He does have multiple prior laparoscopic abdominal surgeries with concern of adhesive disease on prior CT imaging.  He says he was told he had gastroparesis in the past. -Keep n.p.o. for now -Continue antiemetics with scheduled Reglan and as needed Zofran -Pain control with analgesics as needed with hold parameters -Obtain abdominal x-ray series in the morning  Type 2 diabetes: A1c 7.2% on 04/07/2020.  Placed on reduced home insulin degludec 30 units nightly and sensitive SSI q4h while NPO.  Hold home empagliflozin and pioglitazone.  Hypertension: Holding home amlodipine while NPO.  IV  labetalol as needed.  Hyperlipidemia: Lovastatin on hold while NPO.  DVT prophylaxis: Lovenox Code Status: Full code, confirmed with patient Family Communication: Discussed with patient, he has discussed with family Disposition Plan: From home and likely discharge to home pending symptomatic improvement and ability to maintain adequate oral intake Consults called: None Admission status:  Status is: Observation  The patient remains OBS appropriate and will d/c before 2 midnights.  Dispo: The patient is from: Home              Anticipated d/c is to: Home              Anticipated d/c date is: 1 day              Patient currently is not medically stable to d/c.     Darreld Mclean MD [facility]  If 7PM-7AM, please contact night-coverage [email or web address]  04/14/2020, 6:59 PM

## 2020-04-15 ENCOUNTER — Observation Stay (HOSPITAL_COMMUNITY): Payer: Medicare Other

## 2020-04-15 DIAGNOSIS — K3184 Gastroparesis: Secondary | ICD-10-CM | POA: Diagnosis present

## 2020-04-15 DIAGNOSIS — E1143 Type 2 diabetes mellitus with diabetic autonomic (poly)neuropathy: Secondary | ICD-10-CM | POA: Diagnosis present

## 2020-04-15 DIAGNOSIS — Z87891 Personal history of nicotine dependence: Secondary | ICD-10-CM | POA: Diagnosis not present

## 2020-04-15 DIAGNOSIS — E119 Type 2 diabetes mellitus without complications: Secondary | ICD-10-CM | POA: Diagnosis not present

## 2020-04-15 DIAGNOSIS — Z20822 Contact with and (suspected) exposure to covid-19: Secondary | ICD-10-CM | POA: Diagnosis present

## 2020-04-15 DIAGNOSIS — Z888 Allergy status to other drugs, medicaments and biological substances status: Secondary | ICD-10-CM | POA: Diagnosis not present

## 2020-04-15 DIAGNOSIS — Z9049 Acquired absence of other specified parts of digestive tract: Secondary | ICD-10-CM | POA: Diagnosis not present

## 2020-04-15 DIAGNOSIS — I10 Essential (primary) hypertension: Secondary | ICD-10-CM

## 2020-04-15 DIAGNOSIS — Z7982 Long term (current) use of aspirin: Secondary | ICD-10-CM | POA: Diagnosis not present

## 2020-04-15 DIAGNOSIS — Z794 Long term (current) use of insulin: Secondary | ICD-10-CM | POA: Diagnosis not present

## 2020-04-15 DIAGNOSIS — N400 Enlarged prostate without lower urinary tract symptoms: Secondary | ICD-10-CM | POA: Diagnosis present

## 2020-04-15 DIAGNOSIS — E1169 Type 2 diabetes mellitus with other specified complication: Secondary | ICD-10-CM

## 2020-04-15 DIAGNOSIS — Z79899 Other long term (current) drug therapy: Secondary | ICD-10-CM | POA: Diagnosis not present

## 2020-04-15 DIAGNOSIS — E785 Hyperlipidemia, unspecified: Secondary | ICD-10-CM

## 2020-04-15 DIAGNOSIS — K529 Noninfective gastroenteritis and colitis, unspecified: Secondary | ICD-10-CM | POA: Diagnosis present

## 2020-04-15 DIAGNOSIS — R112 Nausea with vomiting, unspecified: Secondary | ICD-10-CM | POA: Diagnosis present

## 2020-04-15 DIAGNOSIS — Z91040 Latex allergy status: Secondary | ICD-10-CM | POA: Diagnosis not present

## 2020-04-15 DIAGNOSIS — I152 Hypertension secondary to endocrine disorders: Secondary | ICD-10-CM | POA: Diagnosis present

## 2020-04-15 DIAGNOSIS — E1159 Type 2 diabetes mellitus with other circulatory complications: Secondary | ICD-10-CM | POA: Diagnosis not present

## 2020-04-15 DIAGNOSIS — F419 Anxiety disorder, unspecified: Secondary | ICD-10-CM | POA: Diagnosis present

## 2020-04-15 DIAGNOSIS — F329 Major depressive disorder, single episode, unspecified: Secondary | ICD-10-CM | POA: Diagnosis present

## 2020-04-15 LAB — BASIC METABOLIC PANEL
Anion gap: 8 (ref 5–15)
BUN: 8 mg/dL (ref 8–23)
CO2: 28 mmol/L (ref 22–32)
Calcium: 7.7 mg/dL — ABNORMAL LOW (ref 8.9–10.3)
Chloride: 104 mmol/L (ref 98–111)
Creatinine, Ser: 0.99 mg/dL (ref 0.61–1.24)
GFR calc Af Amer: >60 mL/min (ref >60)
GFR calc non Af Amer: >60 mL/min (ref >60)
Glucose, Bld: 136 mg/dL — ABNORMAL HIGH (ref 70–99)
Potassium: 3.7 mmol/L (ref 3.5–5.1)
Sodium: 140 mmol/L (ref 135–145)

## 2020-04-15 LAB — CBC
HCT: 46.1 % (ref 39.0–52.0)
Hemoglobin: 13.9 g/dL (ref 13.0–17.0)
MCH: 26 pg (ref 26.0–34.0)
MCHC: 30.2 g/dL (ref 30.0–36.0)
MCV: 86.3 fL (ref 80.0–100.0)
Platelets: 299 10*3/uL (ref 150–400)
RBC: 5.34 MIL/uL (ref 4.22–5.81)
RDW: 14.8 % (ref 11.5–15.5)
WBC: 6.1 10*3/uL (ref 4.0–10.5)
nRBC: 0 % (ref 0.0–0.2)

## 2020-04-15 LAB — GLUCOSE, CAPILLARY
Glucose-Capillary: 123 mg/dL — ABNORMAL HIGH (ref 70–99)
Glucose-Capillary: 126 mg/dL — ABNORMAL HIGH (ref 70–99)
Glucose-Capillary: 137 mg/dL — ABNORMAL HIGH (ref 70–99)
Glucose-Capillary: 140 mg/dL — ABNORMAL HIGH (ref 70–99)
Glucose-Capillary: 151 mg/dL — ABNORMAL HIGH (ref 70–99)
Glucose-Capillary: 88 mg/dL (ref 70–99)

## 2020-04-15 LAB — MAGNESIUM: Magnesium: 1.9 mg/dL (ref 1.7–2.4)

## 2020-04-15 LAB — C DIFFICILE QUICK SCREEN W PCR REFLEX
C Diff antigen: NEGATIVE
C Diff interpretation: NOT DETECTED
C Diff toxin: NEGATIVE

## 2020-04-15 MED ORDER — HYDROMORPHONE HCL 1 MG/ML IJ SOLN
0.5000 mg | INTRAMUSCULAR | Status: DC | PRN
  Administered 2020-04-15 – 2020-04-17 (×7): 0.5 mg via INTRAVENOUS
  Filled 2020-04-15 (×7): qty 0.5

## 2020-04-15 MED ORDER — LACTATED RINGERS IV SOLN: INTRAVENOUS | Status: DC

## 2020-04-15 NOTE — Progress Notes (Signed)
PROGRESS NOTE  Brandon [name] HDQ:[id number] DOB: December 20, 1955 DOA: 04/14/2020 PCP: Patient, No Pcp Per  HPI/Recap of past 24 hours: HPI from Dr Kathleene Hazel Brandon [name] is a 64 y.o. male with medical history significant for T2DM, HTN, HLD, BPH, depression/anxiety, gangrenous cholecystitis February 2020 in [place], and recent admission for SBO who presents to the ED for evaluation of recurrent nausea, vomiting, abdominal pain. Patient was just hospitalized 04/06/2020-04/12/2020 for SBO. Patient had his first full meal for breakfast since discharge consisting of eggs and bacon.  Shortly afterwards he developed recurrent nausea, vomiting, abdominal pain, and watery diarrhea. He reports some abdominal distention and bloating. He says he had several episodes of diarrhea at home and one large bowel movement while waiting in the ED. Pt moved back to [place] from [place] 03/26/2020 and has been under significant stress since then as he is living in a hotel while searching for more long-term residence. In the ED, vitals showed BP 140/86, pulse 121, RR 18, temp 100.2 Fahrenheit, SPO2 91% on room air. Labs showed WBC 12.4, hemoglobin 16.5, platelets 351,000,SARS-CoV-2 PCR is negative. CT abdomen/pelvis shows noninflamed diverticula throughout the descending and sigmoid colon, otherwise unremarkable. The hospitalist service was consulted to admit for further evaluation and management.    Today, patient still reports abdominal pain, generalized with distention, denies any further significant diarrhea, nausea/vomiting, denies any chest pain, shortness of breath, fever/chills.     Assessment/Plan: Principal Problem:   Nausea and vomiting Active Problems:   Hypertension associated with diabetes (HCC)   Diabetes mellitus type 2, controlled, without complications (HCC)   Hyperlipidemia associated with type 2 diabetes mellitus (HCC)   Possible gastroenteritis versus gastroparesis flare Recent SBO History of  multiple abdominal surgeries Afebrile, with no leukocytosis C. difficile negative CT abdomen unremarkable for any acute changes Acute abdominal series showed no bowel obstruction Advance to clear liquid diet continue antiemetics with scheduled Reglan Monitor closely  Diabetes mellitus type 2 A1c 7.2 on 04/07/2020 Continue SSI, Lantus, Accu-Cheks, hypoglycemic protocol Hold home regimen  Hypertension BP stable As needed labetalol for now  Hyperlipidemia Hold home statin for now  BPH Continue Flomax       Malnutrition Type:      Malnutrition Characteristics:      Nutrition Interventions:       Estimated body mass index is 34.26 kg/m as calculated from the following:   Height as of 04/06/20: 5\' 9"  (1.753 m).   Weight as of 04/06/20: 105.2 kg.     Code Status: Full  Family Communication: Discussed with patient  Disposition Plan: Status is: Observation  The patient remains OBS appropriate and will d/c before 2 midnights.  Dispo: The patient is from: Home              Anticipated d/c is to: Home              Anticipated d/c date is: 1 day              Patient currently is not medically stable to d/c.   Consultants:  None  Procedures:  None  Antimicrobials:  None  DVT prophylaxis: Lovenox   Objective: Vitals:   04/14/20 2007 04/14/20 2142 04/15/20 0134 04/15/20 0550  BP: 138/81 (!) 150/70 131/76 126/78  Pulse: 93 93 95 86  Resp: 16 18 17 18   Temp: 99.1 F (37.3 C) 98.7 F (37.1 C) 98.6 F (37 C) 97.7 F (36.5 C)  TempSrc: Oral Oral Oral Oral  SpO2: 95% 94%  94% 95%    Intake/Output Summary (Last 24 hours) at 04/15/2020 1122 Last data filed at 04/15/2020 1000 Gross per 24 hour  Intake 240 ml  Output 478 ml  Net -238 ml   There were no vitals filed for this visit.  Exam:  General: NAD   Cardiovascular: S1, S2 present  Respiratory: CTAB  Abdomen: Soft, +tender, +distended, bowel sounds present  Musculoskeletal: No  bilateral pedal edema noted  Skin: Normal  Psychiatry: Normal mood   Data Reviewed: CBC: Recent Labs  Lab 04/10/20 1503 04/11/20 0516 04/14/20 1332 04/15/20 0543  WBC 5.1 6.0 12.4* 6.1  HGB 14.5 14.2 16.5 13.9  HCT 46.8 47.1 53.6* 46.1  MCV 85.7 85.9 83.6 86.3  PLT 289 274 351 299   Basic Metabolic Panel: Recent Labs  Lab 04/10/20 1503 04/11/20 0516 04/14/20 1332 04/15/20 0543  NA 141 137 138 140  K 4.1 3.8 4.3 3.7  CL 104 104 102 104  CO2 26 24 23 28   GLUCOSE 140* 147* 215* 136*  BUN <5* <5* 10 8  CREATININE 0.76 0.86 0.72 0.99  CALCIUM 8.7* 8.5* 8.8* 7.7*  MG 2.3  --   --  1.9   GFR: Estimated Creatinine Clearance: 90.1 mL/min (by C-G formula based on SCr of 0.99 mg/dL). Liver Function Tests: Recent Labs  Lab 04/10/20 1503 04/11/20 0516 04/14/20 1332  AST 23 22 28   ALT 15 15 17   ALKPHOS 60 60 70  BILITOT 0.7 0.6 0.9  PROT 7.2 6.8 8.0  ALBUMIN 3.9 3.7 4.1   Recent Labs  Lab 04/14/20 1332  LIPASE 50   No results for input(s): AMMONIA in the last 168 hours. Coagulation Profile: No results for input(s): INR, PROTIME in the last 168 hours. Cardiac Enzymes: No results for input(s): CKTOTAL, CKMB, CKMBINDEX, TROPONINI in the last 168 hours. BNP (last 3 results) No results for input(s): PROBNP in the last 8760 hours. HbA1C: No results for input(s): HGBA1C in the last 72 hours. CBG: Recent Labs  Lab 04/12/20 0742 04/14/20 2021 04/15/20 0011 04/15/20 0411 04/15/20 0726  GLUCAP 142* 131* 151* 126* 123*   Lipid Profile: No results for input(s): CHOL, HDL, LDLCALC, TRIG, CHOLHDL, LDLDIRECT in the last 72 hours. Thyroid Function Tests: No results for input(s): TSH, T4TOTAL, FREET4, T3FREE, THYROIDAB in the last 72 hours. Anemia Panel: No results for input(s): VITAMINB12, FOLATE, FERRITIN, TIBC, IRON, RETICCTPCT in the last 72 hours. Urine analysis:    Component Value Date/Time   COLORURINE YELLOW 04/14/2020 1908   APPEARANCEUR CLEAR  04/14/2020 1908   LABSPEC 1.014 04/14/2020 1908   PHURINE 5.0 04/14/2020 1908   GLUCOSEU 50 (A) 04/14/2020 1908   HGBUR NEGATIVE 04/14/2020 1908   BILIRUBINUR NEGATIVE 04/14/2020 1908   KETONESUR NEGATIVE 04/14/2020 1908   PROTEINUR 30 (A) 04/14/2020 1908   NITRITE NEGATIVE 04/14/2020 1908   LEUKOCYTESUR NEGATIVE 04/14/2020 1908   Sepsis Labs: @LABRCNTIP (procalcitonin:4,lacticidven:4)  ) Recent Results (from the past 240 hour(s))  SARS Coronavirus 2 by RT PCR (hospital order, [facility]) Nasopharyngeal Nasopharyngeal Swab     Status: None   Collection Time: 04/06/20  9:38 PM   Specimen: Nasopharyngeal Swab  Result Value Ref Range Status   SARS Coronavirus 2 NEGATIVE NEGATIVE Final    Comment: (NOTE) SARS-CoV-2 target nucleic acids are NOT DETECTED.  The SARS-CoV-2 RNA is generally detectable in upper and lower respiratory specimens during the acute phase of infection. The lowest concentration of SARS-CoV-2 viral copies this assay can detect is  250 copies / mL. A negative result does not preclude SARS-CoV-2 infection and should not be used as the sole basis for treatment or other patient management decisions.  A negative result may occur with improper specimen collection / handling, submission of specimen other than nasopharyngeal swab, presence of viral mutation(s) within the areas targeted by this assay, and inadequate number of viral copies (<250 copies / mL). A negative result must be combined with clinical observations, patient history, and epidemiological information.  Fact Sheet for Patients:   BoilerBrush.com.cy  Fact Sheet for Healthcare Providers: https://pope.com/  This test is not yet approved or  cleared by the [place] FDA and has been authorized for detection and/or diagnosis of SARS-CoV-2 by FDA under an Emergency Use Authorization (EUA).  This EUA will remain in effect (meaning  this test can be used) for the duration of the COVID-19 declaration under Section 564(b)(1) of the Act, 21 U.S.C. section 360bbb-3(b)(1), unless the authorization is terminated or revoked sooner.  Performed at [facility], [address] [place], [place] [address]   SARS Coronavirus 2 by RT PCR (hospital order, performed in [facility] hospital lab) Nasopharyngeal Nasopharyngeal Swab     Status: None   Collection Time: 04/14/20  4:29 PM   Specimen: Nasopharyngeal Swab  Result Value Ref Range Status   SARS Coronavirus 2 NEGATIVE NEGATIVE Final    Comment: (NOTE) SARS-CoV-2 target nucleic acids are NOT DETECTED.  The SARS-CoV-2 RNA is generally detectable in upper and lower respiratory specimens during the acute phase of infection. The lowest concentration of SARS-CoV-2 viral copies this assay can detect is 250 copies / mL. A negative result does not preclude SARS-CoV-2 infection and should not be used as the sole basis for treatment or other patient management decisions.  A negative result may occur with improper specimen collection / handling, submission of specimen other than nasopharyngeal swab, presence of viral mutation(s) within the areas targeted by this assay, and inadequate number of viral copies (<250 copies / mL). A negative result must be combined with clinical observations, patient history, and epidemiological information.  Fact Sheet for Patients:   BoilerBrush.com.cy  Fact Sheet for Healthcare Providers: https://pope.com/  This test is not yet approved or  cleared by the [place] FDA and has been authorized for detection and/or diagnosis of SARS-CoV-2 by FDA under an Emergency Use Authorization (EUA).  This EUA will remain in effect (meaning this test can be used) for the duration of the COVID-19 declaration under Section 564(b)(1) of the Act, 21 U.S.C. section 360bbb-3(b)(1), unless the  authorization is terminated or revoked sooner.  Performed at [facility], [address], [place], [place] [address]       Studies: CT Abdomen Pelvis Wo Contrast  Result Date: 04/14/2020 CLINICAL DATA:  Nausea, vomiting, diarrhea and abdominal pain. EXAM: CT ABDOMEN AND PELVIS WITHOUT CONTRAST TECHNIQUE: Multidetector CT imaging of the abdomen and pelvis was performed following the standard protocol without IV contrast. COMPARISON:  April 06, 2020 FINDINGS: Lower chest: Mild linear scarring and/or atelectasis is seen along the posterolateral aspect of the left lung base. Hepatobiliary: No focal liver abnormality is seen. Status post cholecystectomy. No biliary dilatation. Pancreas: Unremarkable. No pancreatic ductal dilatation or surrounding inflammatory changes. Spleen: Punctate calcified granulomas are seen scattered throughout an otherwise normal-appearing spleen. Adrenals/Urinary Tract: Adrenal glands are unremarkable. Kidneys are normal in size, without renal calculi or hydronephrosis. A 3.2 cm x 2.1 cm cyst is seen within the anterolateral aspect  of the mid right kidney. Bladder is unremarkable. Stomach/Bowel: Stomach is within normal limits. Appendix appears normal. No evidence of bowel wall thickening, distention, or inflammatory changes. Noninflamed diverticula are seen throughout the descending and sigmoid colon. Vascular/Lymphatic: There is moderate severity calcification of the abdominal aorta and bilateral common iliac arteries, without evidence of aneurysmal dilatation. No enlarged abdominal or pelvic lymph nodes. Reproductive: The prostate gland is moderately enlarged. A penile pump and associated reservoir seen. Other: No abdominal wall hernia or abnormality. No abdominopelvic ascites. Musculoskeletal: A metallic density stimulator and associated spinal stimulator wire are seen posterior pelvic wall on the left. Bilateral metallic density pedicle screws are seen at  the level of the L4 and L5 vertebral bodies. IMPRESSION: 1. Noninflamed diverticula throughout the descending and sigmoid colon. 2. Evidence of prior cholecystectomy. 3. Postoperative changes within the lower lumbar spine. 4. Enlarged prostate gland. Aortic Atherosclerosis (ICD10-I70.0). Electronically Signed   By: Aram Candela M.D.   On: 04/14/2020 15:27   Acute Abdominal Series  Result Date: 04/15/2020 CLINICAL DATA:  Patient admitted for small bowel obstruction last week. Nausea and diarrhea. EXAM: DG ABDOMEN ACUTE W/ 1V CHEST COMPARISON:  April 09, 2020 FINDINGS: Streaky opacity in left base is favored represent atelectasis. The heart, hila, mediastinum, lungs, and pleura are otherwise unremarkable. No free air, portal venous gas, or pneumatosis. No bowel dilatation identified. Gas is seen in the colon to the level the rectum. There are some air-fluid levels in the ascending colon on the upright view consistent with the history of diarrhea. No other abnormalities. IMPRESSION: No bowel obstruction. Air-fluid levels in the ascending colon and a normal caliber few small bowel loops consistent with history of diarrhea. Electronically Signed   By: Gerome Sam III M.D   On: 04/15/2020 11:02    Scheduled Meds: . enoxaparin (LOVENOX) injection  40 mg Subcutaneous Q24H  . insulin aspart  0-9 Units Subcutaneous Q4H  . insulin glargine  30 Units Subcutaneous QHS  . metoCLOPramide  10 mg Intravenous Q8H  . tamsulosin  0.4 mg Oral QPC breakfast    Continuous Infusions:   LOS: 0 days     Briant Cedar, MD [facility]  If 7PM-7AM, please contact night-coverage [email or web address] 04/15/2020, 11:22 AM

## 2020-04-16 LAB — GLUCOSE, CAPILLARY
Glucose-Capillary: 100 mg/dL — ABNORMAL HIGH (ref 70–99)
Glucose-Capillary: 100 mg/dL — ABNORMAL HIGH (ref 70–99)
Glucose-Capillary: 135 mg/dL — ABNORMAL HIGH (ref 70–99)
Glucose-Capillary: 163 mg/dL — ABNORMAL HIGH (ref 70–99)
Glucose-Capillary: 173 mg/dL — ABNORMAL HIGH (ref 70–99)
Glucose-Capillary: 89 mg/dL (ref 70–99)

## 2020-04-16 LAB — BASIC METABOLIC PANEL
Anion gap: 8 (ref 5–15)
BUN: <5 mg/dL — ABNORMAL LOW (ref 8–23)
CO2: 27 mmol/L (ref 22–32)
Calcium: 8.2 mg/dL — ABNORMAL LOW (ref 8.9–10.3)
Chloride: 103 mmol/L (ref 98–111)
Creatinine, Ser: 0.89 mg/dL (ref 0.61–1.24)
GFR calc Af Amer: >60 mL/min (ref >60)
GFR calc non Af Amer: >60 mL/min (ref >60)
Glucose, Bld: 103 mg/dL — ABNORMAL HIGH (ref 70–99)
Potassium: 3.2 mmol/L — ABNORMAL LOW (ref 3.5–5.1)
Sodium: 138 mmol/L (ref 135–145)

## 2020-04-16 LAB — CBC WITH DIFFERENTIAL/PLATELET
Abs Immature Granulocytes: 0.01 10*3/uL (ref 0.00–0.07)
Basophils Absolute: 0 10*3/uL (ref 0.0–0.1)
Basophils Relative: 0 %
Eosinophils Absolute: 0.1 10*3/uL (ref 0.0–0.5)
Eosinophils Relative: 2 %
HCT: 43.8 % (ref 39.0–52.0)
Hemoglobin: 13.5 g/dL (ref 13.0–17.0)
Immature Granulocytes: 0 %
Lymphocytes Relative: 19 %
Lymphs Abs: 1 10*3/uL (ref 0.7–4.0)
MCH: 26.3 pg (ref 26.0–34.0)
MCHC: 30.8 g/dL (ref 30.0–36.0)
MCV: 85.4 fL (ref 80.0–100.0)
Monocytes Absolute: 0.7 10*3/uL (ref 0.1–1.0)
Monocytes Relative: 13 %
Neutro Abs: 3.6 10*3/uL (ref 1.7–7.7)
Neutrophils Relative %: 66 %
Platelets: 263 10*3/uL (ref 150–400)
RBC: 5.13 MIL/uL (ref 4.22–5.81)
RDW: 14.8 % (ref 11.5–15.5)
WBC: 5.4 10*3/uL (ref 4.0–10.5)
nRBC: 0 % (ref 0.0–0.2)

## 2020-04-16 LAB — MAGNESIUM: Magnesium: 1.9 mg/dL (ref 1.7–2.4)

## 2020-04-16 MED ORDER — ENSURE MAX PROTEIN PO LIQD
11.0000 [oz_av] | Freq: Two times a day (BID) | ORAL | Status: DC
  Administered 2020-04-17: 11 [oz_av] via ORAL

## 2020-04-16 MED ORDER — POTASSIUM CHLORIDE CRYS ER 20 MEQ PO TBCR
40.0000 meq | EXTENDED_RELEASE_TABLET | Freq: Once | ORAL | Status: AC
  Administered 2020-04-16: 40 meq via ORAL
  Filled 2020-04-16: qty 2

## 2020-04-16 NOTE — Progress Notes (Signed)
Initial Nutrition Assessment  RD working remotely.  DOCUMENTATION CODES:   Not applicable  INTERVENTION:  Provide Ensure Max Protein po BID, each supplement provides 150 kcal and 30 grams of protein.  NUTRITION DIAGNOSIS:   Inadequate oral intake related to decreased appetite, nausea, vomiting as evidenced by per patient/family report.  GOAL:   Patient will meet greater than or equal to 90% of their needs  MONITOR:   PO intake, Diet advancement, Supplement acceptance, Labs, Weight trends, I & O's  REASON FOR ASSESSMENT:   Malnutrition Screening Tool    ASSESSMENT:   64 year old male with PMHx of DM, HTN, anxiety, depression, gangrenous cholecystitis 09/2018 in [place], recent admission for SBO admitted with possible gastroenteritis vs gastroparesis flare.   Spoke with patient over the phone. He reports his first hospitalization was approximately one week ago and since then he has had decreased appetite and intake related to N/V. He does report his appetite is starting to come back now and he is tolerating meals so far today. Diet was advanced to soft today and he reports he was able to tolerate cottage cheese, pudding, and some other more solid foods at his meal. Discussed importance of adequate calorie/protein intake to prevent unintentional loss of weight/lean body mass. Patient is amenable to drinking Ensure Max Protein between meals. Patient repots prior to onset of recent SBO he had a good appetite and intake at baseline.  Patient reports his UBW was 232 lbs, which he weighed one week ago. No weight taken this admission.  Medications reviewed and include: Novolog 0-9 units Q4hrs, Lantus 30 units QHS, Reglan 10 mg Q8hrs IV, Flomax, LR at 75 mL/hr.  Labs reviewed: CBG 89-163, Potassium 3.2, BUN <5.  Unable to determine if patient meets criteria for malnutrition at this time but he is at risk for malnutrition.  NUTRITION - FOCUSED PHYSICAL EXAM:  Unable to complete at this  time as RD is working remotely.  Diet Order:   Diet Order            DIET SOFT Room service appropriate? Yes; Fluid consistency: Thin  Diet effective now                EDUCATION NEEDS:   No education needs have been identified at this time  Skin:  Skin Assessment: Reviewed RN Assessment  Last BM:  04/15/2020 - medium type 7  Height:   Ht Readings from Last 1 Encounters:  04/06/20 5\' 9"  (1.753 m)   Weight:   Wt Readings from Last 1 Encounters:  04/06/20 105.2 kg   BMI:  There is no height or weight on file to calculate BMI.  Estimated Nutritional Needs:   Kcal:  2200-2400  Protein:  110-120 grams  Fluid:  >/= 2.2 L/day  06/06/20, MS, RD, LDN Pager number available on [email or web address]

## 2020-04-16 NOTE — Progress Notes (Signed)
PROGRESS NOTE  Brandon [name]:[id number] DOB: 1955/09/13 DOA: 04/14/2020 PCP: Patient, No Pcp Per  HPI/Recap of past 24 hours: HPI from Dr Kathleene Hazel Brandon [name] is a 64 y.o. male with medical history significant for T2DM, HTN, HLD, BPH, depression/anxiety, gangrenous cholecystitis February 2020 in [place], and recent admission for SBO who presents to the ED for evaluation of recurrent nausea, vomiting, abdominal pain. Patient was just hospitalized 04/06/2020-04/12/2020 for SBO. Patient had his first full meal for breakfast since discharge consisting of eggs and bacon.  Shortly afterwards he developed recurrent nausea, vomiting, abdominal pain, and watery diarrhea. He reports some abdominal distention and bloating. He says he had several episodes of diarrhea at home and one large bowel movement while waiting in the ED. Pt moved back to [place] from [place] 03/26/2020 and has been under significant stress since then as he is living in a hotel while searching for more long-term residence. In the ED, vitals showed BP 140/86, pulse 121, RR 18, temp 100.2 Fahrenheit, SPO2 91% on room air. Labs showed WBC 12.4, hemoglobin 16.5, platelets 351,000,SARS-CoV-2 PCR is negative. CT abdomen/pelvis shows noninflamed diverticula throughout the descending and sigmoid colon, otherwise unremarkable. The hospitalist service was consulted to admit for further evaluation and management.    Today, patient reports feeling better than yesterday, abdominal pain and distention improving, denies further nausea/vomiting/diarrhea.     Assessment/Plan: Principal Problem:   Nausea and vomiting Active Problems:   Hypertension associated with diabetes (HCC)   Diabetes mellitus type 2, controlled, without complications (HCC)   Hyperlipidemia associated with type 2 diabetes mellitus (HCC)   Possible gastroenteritis versus gastroparesis flare Recent SBO History of multiple abdominal surgeries Afebrile, with no  leukocytosis C. difficile negative CT abdomen unremarkable for any acute changes Acute abdominal series showed no bowel obstruction Advance diet as tolerated Monitor closely  Diabetes mellitus type 2 A1c 7.2 on 04/07/2020 Continue SSI, Lantus, Accu-Cheks, hypoglycemic protocol Hold home regimen  Hypertension BP stable As needed labetalol for now  Hyperlipidemia Hold home statin for now  BPH Continue Flomax       Malnutrition Type:      Malnutrition Characteristics:      Nutrition Interventions:       Estimated body mass index is 34.26 kg/m as calculated from the following:   Height as of 04/06/20: 5\' 9"  (1.753 m).   Weight as of 04/06/20: 105.2 kg.     Code Status: Full  Family Communication: Discussed with patient  Disposition Plan: Status is: Inpatient  The patient remains OBS appropriate and will d/c before 2 midnights.  Dispo: The patient is from: Home              Anticipated d/c is to: Home              Anticipated d/c date is: 1 day              Patient currently is not medically stable to d/c.   Consultants:  None  Procedures:  None  Antimicrobials:  None  DVT prophylaxis: Lovenox   Objective: Vitals:   04/15/20 1336 04/15/20 1952 04/16/20 0534 04/16/20 1340  BP: 123/71 138/66 125/71 (!) 142/75  Pulse: 82 90 86 87  Resp: 17 18 16 15   Temp: 98.1 F (36.7 C) 97.9 F (36.6 C) 97.9 F (36.6 C) (!) 97.5 F (36.4 C)  TempSrc: Oral Oral Oral Oral  SpO2: 97% 96% 98% 98%    Intake/Output Summary (Last 24 hours) at 04/16/2020 1351  Last data filed at 04/16/2020 [id number] Gross per 24 hour  Intake 2075 ml  Output 1000 ml  Net 1075 ml   There were no vitals filed for this visit.  Exam:  General: NAD   Cardiovascular: S1, S2 present  Respiratory: CTAB  Abdomen: Soft, nontender, non distended, bowel sounds present  Musculoskeletal: No bilateral pedal edema noted  Skin: Normal  Psychiatry: Normal mood   Data  Reviewed: CBC: Recent Labs  Lab 04/10/20 1503 04/11/20 0516 04/14/20 1332 04/15/20 0543 04/16/20 0607  WBC 5.1 6.0 12.4* 6.1 5.4  NEUTROABS  --   --   --   --  3.6  HGB 14.5 14.2 16.5 13.9 13.5  HCT 46.8 47.1 53.6* 46.1 43.8  MCV 85.7 85.9 83.6 86.3 85.4  PLT 289 274 351 299 263   Basic Metabolic Panel: Recent Labs  Lab 04/10/20 1503 04/11/20 0516 04/14/20 1332 04/15/20 0543 04/16/20 0607  NA 141 137 138 140 138  K 4.1 3.8 4.3 3.7 3.2*  CL 104 104 102 104 103  CO2 26 24 23 28 27   GLUCOSE 140* 147* 215* 136* 103*  BUN <5* <5* 10 8 <5*  CREATININE 0.76 0.86 0.72 0.99 0.89  CALCIUM 8.7* 8.5* 8.8* 7.7* 8.2*  MG 2.3  --   --  1.9  --    GFR: Estimated Creatinine Clearance: 100.2 mL/min (by C-G formula based on SCr of 0.89 mg/dL). Liver Function Tests: Recent Labs  Lab 04/10/20 1503 04/11/20 0516 04/14/20 1332  AST 23 22 28   ALT 15 15 17   ALKPHOS 60 60 70  BILITOT 0.7 0.6 0.9  PROT 7.2 6.8 8.0  ALBUMIN 3.9 3.7 4.1   Recent Labs  Lab 04/14/20 1332  LIPASE 50   No results for input(s): AMMONIA in the last 168 hours. Coagulation Profile: No results for input(s): INR, PROTIME in the last 168 hours. Cardiac Enzymes: No results for input(s): CKTOTAL, CKMB, CKMBINDEX, TROPONINI in the last 168 hours. BNP (last 3 results) No results for input(s): PROBNP in the last 8760 hours. HbA1C: No results for input(s): HGBA1C in the last 72 hours. CBG: Recent Labs  Lab 04/15/20 1958 04/16/20 0003 04/16/20 0358 04/16/20 0750 04/16/20 1155  GLUCAP 140* 100* 89 100* 163*   Lipid Profile: No results for input(s): CHOL, HDL, LDLCALC, TRIG, CHOLHDL, LDLDIRECT in the last 72 hours. Thyroid Function Tests: No results for input(s): TSH, T4TOTAL, FREET4, T3FREE, THYROIDAB in the last 72 hours. Anemia Panel: No results for input(s): VITAMINB12, FOLATE, FERRITIN, TIBC, IRON, RETICCTPCT in the last 72 hours. Urine analysis:    Component Value Date/Time   COLORURINE YELLOW  04/14/2020 1908   APPEARANCEUR CLEAR 04/14/2020 1908   LABSPEC 1.014 04/14/2020 1908   PHURINE 5.0 04/14/2020 1908   GLUCOSEU 50 (A) 04/14/2020 1908   HGBUR NEGATIVE 04/14/2020 1908   BILIRUBINUR NEGATIVE 04/14/2020 1908   KETONESUR NEGATIVE 04/14/2020 1908   PROTEINUR 30 (A) 04/14/2020 1908   NITRITE NEGATIVE 04/14/2020 1908   LEUKOCYTESUR NEGATIVE 04/14/2020 1908   Sepsis Labs: @LABRCNTIP (procalcitonin:4,lacticidven:4)  ) Recent Results (from the past 240 hour(s))  SARS Coronavirus 2 by RT PCR (hospital order, performed in [facility] hospital lab) Nasopharyngeal Nasopharyngeal Swab     Status: None   Collection Time: 04/06/20  9:38 PM   Specimen: Nasopharyngeal Swab  Result Value Ref Range Status   SARS Coronavirus 2 NEGATIVE NEGATIVE Final    Comment: (NOTE) SARS-CoV-2 target nucleic acids are NOT DETECTED.  The SARS-CoV-2 RNA is generally detectable in  upper and lower respiratory specimens during the acute phase of infection. The lowest concentration of SARS-CoV-2 viral copies this assay can detect is 250 copies / mL. A negative result does not preclude SARS-CoV-2 infection and should not be used as the sole basis for treatment or other patient management decisions.  A negative result may occur with improper specimen collection / handling, submission of specimen other than nasopharyngeal swab, presence of viral mutation(s) within the areas targeted by this assay, and inadequate number of viral copies (<250 copies / mL). A negative result must be combined with clinical observations, patient history, and epidemiological information.  Fact Sheet for Patients:   BoilerBrush.com.cy  Fact Sheet for Healthcare Providers: https://pope.com/  This test is not yet approved or  cleared by the [place] FDA and has been authorized for detection and/or diagnosis of SARS-CoV-2 by FDA under an Emergency Use Authorization (EUA).   This EUA will remain in effect (meaning this test can be used) for the duration of the COVID-19 declaration under Section 564(b)(1) of the Act, 21 U.S.C. section 360bbb-3(b)(1), unless the authorization is terminated or revoked sooner.  Performed at [facility], [address] [place], [place] [address]   SARS Coronavirus 2 by RT PCR (hospital order, performed in [facility] hospital lab) Nasopharyngeal Nasopharyngeal Swab     Status: None   Collection Time: 04/14/20  4:29 PM   Specimen: Nasopharyngeal Swab  Result Value Ref Range Status   SARS Coronavirus 2 NEGATIVE NEGATIVE Final    Comment: (NOTE) SARS-CoV-2 target nucleic acids are NOT DETECTED.  The SARS-CoV-2 RNA is generally detectable in upper and lower respiratory specimens during the acute phase of infection. The lowest concentration of SARS-CoV-2 viral copies this assay can detect is 250 copies / mL. A negative result does not preclude SARS-CoV-2 infection and should not be used as the sole basis for treatment or other patient management decisions.  A negative result may occur with improper specimen collection / handling, submission of specimen other than nasopharyngeal swab, presence of viral mutation(s) within the areas targeted by this assay, and inadequate number of viral copies (<250 copies / mL). A negative result must be combined with clinical observations, patient history, and epidemiological information.  Fact Sheet for Patients:   BoilerBrush.com.cy  Fact Sheet for Healthcare Providers: https://pope.com/  This test is not yet approved or  cleared by the [place] FDA and has been authorized for detection and/or diagnosis of SARS-CoV-2 by FDA under an Emergency Use Authorization (EUA).  This EUA will remain in effect (meaning this test can be used) for the duration of the COVID-19 declaration under Section 564(b)(1) of the Act, 21  U.S.C. section 360bbb-3(b)(1), unless the authorization is terminated or revoked sooner.  Performed at [facility], [address], [place], [place] [address]   Culture, blood (routine x 2)     Status: None (Preliminary result)   Collection Time: 04/14/20  9:08 PM   Specimen: BLOOD  Result Value Ref Range Status   Specimen Description   Final    BLOOD LEFT ARM Performed at [facility], [address], [place], [place] [address]    Special Requests   Final    BOTTLES DRAWN AEROBIC AND ANAEROBIC Blood Culture adequate volume Performed at [facility], [address] [place], [place] [address]    Culture   Final    NO GROWTH 2 DAYS Performed at [facility], [address], [place], [place] [address]    Report Status PENDING  Incomplete  Culture, blood (routine x 2)     Status: None (Preliminary result)   Collection Time: 04/14/20  9:08 PM   Specimen: BLOOD  Result Value Ref Range Status   Specimen Description   Final    BLOOD RIGHT ARM Performed at [facility], [address], [place], [place] [address]    Special Requests   Final    BOTTLES DRAWN AEROBIC ONLY Blood Culture adequate volume Performed at [facility], [address], [place], [place] [address]    Culture   Final    NO GROWTH 2 DAYS Performed at [facility], [address] [place], [place] [address]    Report Status PENDING  Incomplete  C Difficile Quick Screen w PCR reflex     Status: None   Collection Time: 04/15/20  6:47 AM   Specimen: STOOL  Result Value Ref Range Status   C Diff antigen NEGATIVE NEGATIVE Final   C Diff toxin NEGATIVE NEGATIVE Final   C Diff interpretation No C. difficile detected.  Final    Comment: Performed at [facility], [address] [place], [place] [address]      Studies: No results found.  Scheduled Meds: . enoxaparin (LOVENOX) injection   40 mg Subcutaneous Q24H  . insulin aspart  0-9 Units Subcutaneous Q4H  . insulin glargine  30 Units Subcutaneous QHS  . metoCLOPramide  10 mg Intravenous Q8H  . tamsulosin  0.4 mg Oral QPC breakfast    Continuous Infusions: . lactated ringers 75 mL/hr at 04/16/20 0539     LOS: 1 day     Briant Cedar, MD [facility]  If 7PM-7AM, please contact night-coverage [email or web address] 04/16/2020, 1:51 PM

## 2020-04-16 NOTE — Social Work (Signed)
TOC CSW has spoken with pt.  Pt is in need of medication assistance.  His MCR does not pay for medication and his wife's insurance does not start until 04/26/2020.  Pt also has concerns with housing.  Pt and wife moved here from [place] and are living in a hotel.  Pts wife accepted a job from [employer] which prompted the move here, but unfortunately have not been able to find housing rent/purchase.  Medication match letter can not be done until tomorrow pending discharge.   CSW will continue to follow.  Ricquita Tarpley-Carter, MSW, LCSW-A                  [facility] ED Transitions of CareClinical Social Worker [email or web address] [phone]

## 2020-04-16 NOTE — TOC Initial Note (Signed)
Transition of Care [facility]) - Initial/Assessment Note    Patient Details  Name: Brandon [name] MRN: [id number] Date of Birth: 1956/02/24  Transition of [facility]) CM/SW Contact:    Armanda Heritage, RN Phone Number: 04/16/2020, 12:27 PM  Clinical Narrative:      CM noted TOC consult.  CM spoke with patient who reports that he has Medicare part A and B but does not have part D.  Patient was previously insured under his wife's insurance policy from her employer which covered the cost of his medications.  He and his spouse have recently moved to [place] from [place] and his wife has accepted a job with [employer] and began working on 8/16.  Patient expects that his wife's insurance will begin on 04/26/20, and he will be covered under her policy and have medication coverage at that time.  Patient is concerned that he will run out of his Ozempic medication before the new insurance policy starts on 9/1.  The medication is very costly and patient cannot afford it without insurance coverage. CM spoke with patient regarding the option to establish primary care with the [facility] and utilize their pharmacy, possibly utilize the onetime free fill program to obtain the Ozempic if needed before 9/1.  Should pharmacy not carry this medication, patient may benefit from a MATCH letter to reduce the cost of medication.  TOC will continue to follow and assist with medication as needed.   Patient also shares that he and his wife have been living in a [place] since arriving to [place].  Patient reports he has a real estate agent that has been assisting him and spouse but they have a dispute with their previous landlord in [place] that is creating a barrier to renting due to negative reports on his rental history.  Patient reports that he is concerned that they will run out of money before being able to secure permanent housing.  Patient was provided with resources for the [place] housing authority and coalition.  Patient plans to call  Monday morning to speak with the agency and initiate the process of working with them to find permanent housing.                 Expected Discharge Plan: Home/Self Care Barriers to Discharge: Continued Medical Work up   Patient Goals and CMS Choice Patient states their goals for this hospitalization and ongoing recovery are:: to go home and find a permanent place to live      Expected Discharge Plan and Services Expected Discharge Plan: Home/Self Care   Discharge Planning Services: CM Consult   Living arrangements for the past 2 months: Hotel/Motel                                      Prior Living Arrangements/Services Living arrangements for the past 2 months: Hotel/Motel Lives with:: Spouse Patient language and need for interpreter reviewed:: Yes Do you feel safe going back to the place where you live?: Yes      Need for Family Participation in Patient Care: Yes (Comment) Care giver support system in place?: Yes (comment)   Criminal Activity/Legal Involvement Pertinent to Current Situation/Hospitalization: No - Comment as needed  Activities of Daily Living Home Assistive Devices/Equipment: Eyeglasses, Dentures (specify type) (upper and lower dentures) ADL Screening (condition at time of admission) Patient's cognitive ability adequate to safely complete daily activities?: Yes Is the patient  deaf or have difficulty hearing?: No Does the patient have difficulty seeing, even when wearing glasses/contacts?: No Does the patient have difficulty concentrating, remembering, or making decisions?: No Patient able to express need for assistance with ADLs?: Yes Does the patient have difficulty dressing or bathing?: No Independently performs ADLs?: Yes (appropriate for developmental age) Does the patient have difficulty walking or climbing stairs?: Yes Weakness of Legs: Both Weakness of Arms/Hands: None  Permission Sought/Granted                  Emotional  Assessment Appearance:: Appears stated age Attitude/Demeanor/Rapport: Engaged Affect (typically observed): Accepting Orientation: : Oriented to Self, Oriented to Place, Oriented to  Time, Oriented to Situation   Psych Involvement: No (comment)  Admission diagnosis:  Nausea and vomiting [R11.2] Abdominal pain, unspecified abdominal location [R10.9] Patient Active Problem List   Diagnosis Date Noted  . Nausea and vomiting 04/14/2020  . Hyperlipidemia associated with type 2 diabetes mellitus (HCC) 04/14/2020  . Encounter for imaging study to confirm nasogastric (NG) tube placement   . Hypertension associated with diabetes (HCC) 04/07/2020  . Diabetes mellitus type 2, controlled, without complications (HCC) 04/07/2020  . SBO (small bowel obstruction) (HCC) 04/06/2020   PCP:  Patient, No Pcp Per Pharmacy:  No Pharmacies Listed    Social Determinants of Health (SDOH) Interventions    Readmission Risk Interventions No flowsheet data found.

## 2020-04-17 LAB — GASTROINTESTINAL PANEL BY PCR, STOOL (REPLACES STOOL CULTURE)

## 2020-04-17 LAB — GLUCOSE, CAPILLARY
Glucose-Capillary: 105 mg/dL — ABNORMAL HIGH (ref 70–99)
Glucose-Capillary: 110 mg/dL — ABNORMAL HIGH (ref 70–99)
Glucose-Capillary: 114 mg/dL — ABNORMAL HIGH (ref 70–99)
Glucose-Capillary: 157 mg/dL — ABNORMAL HIGH (ref 70–99)

## 2020-04-17 LAB — BASIC METABOLIC PANEL
Anion gap: 9 (ref 5–15)
BUN: <5 mg/dL — ABNORMAL LOW (ref 8–23)
CO2: 29 mmol/L (ref 22–32)
Calcium: 8.8 mg/dL — ABNORMAL LOW (ref 8.9–10.3)
Chloride: 102 mmol/L (ref 98–111)
Creatinine, Ser: 0.84 mg/dL (ref 0.61–1.24)
GFR calc Af Amer: >60 mL/min (ref >60)
GFR calc non Af Amer: >60 mL/min (ref >60)
Glucose, Bld: 110 mg/dL — ABNORMAL HIGH (ref 70–99)
Potassium: 3.5 mmol/L (ref 3.5–5.1)
Sodium: 140 mmol/L (ref 135–145)

## 2020-04-17 MED ORDER — ONDANSETRON 4 MG PO TBDP: 4.0000 mg | ORAL_TABLET | Freq: Three times a day (TID) | ORAL | 0 refills | Status: DC | PRN

## 2020-04-17 MED ORDER — METOCLOPRAMIDE HCL 10 MG PO TABS: 10.0000 mg | ORAL_TABLET | Freq: Three times a day (TID) | ORAL | 0 refills | Status: DC

## 2020-04-17 NOTE — Plan of Care (Signed)

## 2020-04-17 NOTE — Discharge Summary (Signed)
Discharge Summary  Brandon [name]:[id number] DOB: [birth year]  PCP: Patient, No Pcp Per  Admit date: 04/14/2020 Discharge date: 04/17/2020  Time spent: 40 mins  Recommendations for Outpatient Follow-up:  1. Establish care with [facility] in 1 week   Discharge Diagnoses:  Active Hospital Problems   Diagnosis Date Noted  . Nausea and vomiting 04/14/2020  . Hyperlipidemia associated with type 2 diabetes mellitus (HCC) 04/14/2020  . Diabetes mellitus type 2, controlled, without complications (HCC) 04/07/2020  . Hypertension associated with diabetes (HCC) 04/07/2020    Resolved Hospital Problems  No resolved problems to display.    Discharge Condition: Stable  Diet recommendation: Advance as tolerated  Vitals:   04/16/20 2056 04/17/20 0512  BP: 140/68 (!) 144/80  Pulse: 90 77  Resp: 17 17  Temp: 98.4 F (36.9 C) 97.7 F (36.5 C)  SpO2: 95% 96%    History of present illness:  Brandon [name] a 64 y.o.malewith medical history significant forT2DM, HTN, HLD, BPH, depression/anxiety, gangrenous cholecystitisFebruary2020in [place], and recent admission for SBOwho presents to the ED for evaluation of recurrent nausea, vomiting, abdominal pain. Patient was just hospitalized 04/06/2020-04/12/2020 for SBO. Patient had his first full meal for breakfast since discharge consisting of eggs and bacon. Shortly afterwards he developed recurrent nausea, vomiting, abdominal pain, and watery diarrhea. He reports some abdominal distention and bloating. He says he had several episodes of diarrhea at home and one large bowel movement while waiting in the ED. Pt moved back to [place] from [place] 03/26/2020 and has been under significant stress since then as he is living in a hotel while searching for more long-term residence. In the ED, vitals showed BP 140/86, pulse 121, RR 18, temp 100.2 Fahrenheit, SPO2 91% on room air. Labs showed WBC 12.4, hemoglobin 16.5, platelets  351,000,SARS-CoV-2 PCR is negative. CT abdomen/pelvis shows noninflamed diverticula throughout the descending and sigmoid colon, otherwise unremarkable. The hospitalist service was consulted to admit for further evaluation and management.    Today, patient reports some abdominal bloating, denies any tenderness, nausea/vomiting, fever/chills, chest pain, shortness of breath. Patient advised to establish care with [facility], for follow-up.     Hospital Course:  Principal Problem:   Nausea and vomiting Active Problems:   Hypertension associated with diabetes (HCC)   Diabetes mellitus type 2, controlled, without complications (HCC)   Hyperlipidemia associated with type 2 diabetes mellitus (HCC)   Possible gastroenteritis versus ??gastroparesis flare Recent SBO History of multiple abdominal surgeries Afebrile, with no leukocytosis C. difficile negative CT abdomen unremarkable for any acute changes Acute abdominal series showed no bowel obstruction Advance diet as tolerated  Diabetes mellitus type 2 A1c 7.2 on 04/07/2020 Continue home regimen   Hypertension BP stable  Hyperlipidemia Continue statin  BPH Continue Flomax       Malnutrition Type:  Nutrition Problem: Inadequate oral intake Etiology: decreased appetite, nausea, vomiting   Malnutrition Characteristics:  Signs/Symptoms: per patient/family report   Nutrition Interventions:  Interventions: Refer to RD note for recommendations   Estimated body mass index is 34.26 kg/m as calculated from the following:   Height as of this encounter:  (1.753 m).   Weight as of this encounter: 105.2 kg.    Procedures:  None  Consultations:  None  Discharge Exam: BP (!) 144/80 (BP Location: Right Arm)   Pulse 77   Temp 97.7 F (36.5 C)   Resp 17   Ht  (1.753 m)   Wt 105.2 kg  SpO2 96%   BMI 34.26 kg/m   General: NAD Cardiovascular: S1, S2 present Abdomen: Soft,  nontender, nondistended, bowel sounds present Respiratory: CTAB    Discharge Instructions You were cared for by a hospitalist during your hospital stay. If you have any questions about your discharge medications or the care you received while you were in the hospital after you are discharged, you can call the unit and asked to speak with the hospitalist on call if the hospitalist that took care of you is not available. Once you are discharged, your primary care physician will handle any further medical issues. Please note that NO REFILLS for any discharge medications will be authorized once you are discharged, as it is imperative that you return to your primary care physician (or establish a relationship with a primary care physician if you do not have one) for your aftercare needs so that they can reassess your need for medications and monitor your lab values.  Discharge Instructions    Diet - low sodium heart healthy   Complete by: As directed    Increase activity slowly   Complete by: As directed      Allergies as of 04/17/2020      Reactions   Ancef [cefazolin] Rash   Latex Rash   Levaquin [levofloxacin] Rash      Medication List    TAKE these medications   amLODipine 10 MG tablet Commonly known as: NORVASC Take 10 mg by mouth daily.   ARIPiprazole 10 MG tablet Commonly known as: ABILIFY Take 10 mg by mouth daily.   aspirin 81 MG chewable tablet Chew 81 mg by mouth daily.   empagliflozin 25 MG Tabs tablet Commonly known as: JARDIANCE Take 25 mg by mouth daily.   insulin lispro 100 UNIT/ML injection Commonly known as: HUMALOG Inject 0-10 Units into the skin 3 (three) times daily before meals. Sliding Scale   lovastatin 40 MG 24 hr tablet Commonly known as: ALTOPREV Take 40 mg by mouth at bedtime.   metoCLOPramide 10 MG tablet Commonly known as: REGLAN Take 1 tablet (10 mg total) by mouth 3 (three) times daily with meals for 7 days.   ondansetron 4 MG  disintegrating tablet Commonly known as: Zofran ODT Take 1 tablet (4 mg total) by mouth every 8 (eight) hours as needed for nausea or vomiting.   pantoprazole 40 MG tablet Commonly known as: PROTONIX Take 40 mg by mouth daily.   pioglitazone 30 MG tablet Commonly known as: ACTOS Take 30 mg by mouth daily.   tamsulosin 0.4 MG Caps capsule Commonly known as: FLOMAX Take 0.4 mg by mouth.   Tresiba 100 UNIT/ML Soln Generic drug: Insulin Degludec Inject 60 Units into the skin.      Allergies  Allergen Reactions  . Ancef [Cefazolin] Rash  . Latex Rash  . Levaquin [Levofloxacin] Rash    Follow-up Information    [facility]. Schedule an appointment as soon as possible for a visit in 1 week(s).   Why: To establish care  Contact information: [address] [place] [address] [phone]               The results of significant diagnostics from this hospitalization (including imaging, microbiology, ancillary and laboratory) are listed below for reference.    Significant Diagnostic Studies: CT Abdomen Pelvis Wo Contrast  Result Date: 04/14/2020 CLINICAL DATA:  Nausea, vomiting, diarrhea and abdominal pain. EXAM: CT ABDOMEN AND PELVIS WITHOUT CONTRAST TECHNIQUE: Multidetector CT  imaging of the abdomen and pelvis was performed following the standard protocol without IV contrast. COMPARISON:  April 06, 2020 FINDINGS: Lower chest: Mild linear scarring and/or atelectasis is seen along the posterolateral aspect of the left lung base. Hepatobiliary: No focal liver abnormality is seen. Status post cholecystectomy. No biliary dilatation. Pancreas: Unremarkable. No pancreatic ductal dilatation or surrounding inflammatory changes. Spleen: Punctate calcified granulomas are seen scattered throughout an otherwise normal-appearing spleen. Adrenals/Urinary Tract: Adrenal glands are unremarkable. Kidneys are normal in size, without renal  calculi or hydronephrosis. A 3.2 cm x 2.1 cm cyst is seen within the anterolateral aspect of the mid right kidney. Bladder is unremarkable. Stomach/Bowel: Stomach is within normal limits. Appendix appears normal. No evidence of bowel wall thickening, distention, or inflammatory changes. Noninflamed diverticula are seen throughout the descending and sigmoid colon. Vascular/Lymphatic: There is moderate severity calcification of the abdominal aorta and bilateral common iliac arteries, without evidence of aneurysmal dilatation. No enlarged abdominal or pelvic lymph nodes. Reproductive: The prostate gland is moderately enlarged. A penile pump and associated reservoir seen. Other: No abdominal wall hernia or abnormality. No abdominopelvic ascites. Musculoskeletal: A metallic density stimulator and associated spinal stimulator wire are seen posterior pelvic wall on the left. Bilateral metallic density pedicle screws are seen at the level of the L4 and L5 vertebral bodies. IMPRESSION: 1. Noninflamed diverticula throughout the descending and sigmoid colon. 2. Evidence of prior cholecystectomy. 3. Postoperative changes within the lower lumbar spine. 4. Enlarged prostate gland. Aortic Atherosclerosis (ICD10-I70.0). Electronically Signed   By: Aram Candela M.D.   On: 04/14/2020 15:27   DG Chest 1 View  Result Date: 04/08/2020 CLINICAL DATA:  Hypoxemia.  Recent small-bowel obstruction EXAM: CHEST  1 VIEW COMPARISON:  None. FINDINGS: Linear opacities at the left base seen on recent abdominal CT and attributed to scarring. There is no edema, consolidation, effusion, or pneumothorax. Normal heart size and mediastinal contours. Dorsal column stimulator leads. IMPRESSION: 1. No acute finding. 2. Scarring at the left lung base. Electronically Signed   By: Marnee Spring M.D.   On: 04/08/2020 15:19   DG Abd 1 View  Result Date: 04/09/2020 CLINICAL DATA:  Abdominal distension EXAM: ABDOMEN - 1 VIEW COMPARISON:  04/08/2020  FINDINGS: Multiple dilated loops of small bowel are again identified and relatively stable. No free air is seen. No mass lesion is noted. Postsurgical changes are identified. IMPRESSION: Stable dilated small bowel loops consistent with at least partial small bowel obstruction. Electronically Signed   By: Alcide Clever M.D.   On: 04/09/2020 04:41   DG Abdomen 1 View  Result Date: 04/06/2020 CLINICAL DATA:  NG tube placement EXAM: ABDOMEN - 1 VIEW COMPARISON:  04/06/2020 FINDINGS: Ascending thoracic stimulator leads. Esophageal tube tip and side port overlie the proximal stomach. Partially visualized dilated small bowel consistent with obstruction. IMPRESSION: Esophageal tube tip overlies the proximal stomach. Electronically Signed   By: Jasmine Pang M.D.   On: 04/06/2020 23:43   CT ABDOMEN PELVIS W CONTRAST  Result Date: 04/06/2020 CLINICAL DATA:  Abdominal pain, nausea, vomiting, diarrhea EXAM: CT ABDOMEN AND PELVIS WITH CONTRAST TECHNIQUE: Multidetector CT imaging of the abdomen and pelvis was performed using the standard protocol following bolus administration of intravenous contrast. CONTRAST:  OMNIPAQUE IOHEXOL 300 MG/ML  SOLN COMPARISON:  None. FINDINGS: Lower chest: Moderate right coronary artery calcification. Global cardiac size within normal limits. No pericardial effusion. Visualized lung bases are clear bilaterally. Hepatobiliary: Mild hepatic steatosis. Liver otherwise unremarkable. Status post cholecystectomy. No intra or  extrahepatic biliary ductal dilation. Pancreas: Unremarkable Spleen: Unremarkable Adrenals/Urinary Tract: The adrenal glands are unremarkable. The kidneys are normal in size and position. Simple cortical cyst noted within the upper pole of the right kidney. The kidneys are otherwise unremarkable. The bladder is unremarkable. Stomach/Bowel: A developing mid small bowel obstruction is identified with a point of transition seen within the right mid abdomen best noted on  axial image # 42, sagittal image # 44 and coronal image # 17. Mild tethering of the small bowel in this location suggests that this may relate to an underlying adhesion. The obstruction appears incomplete at this point as the a small amount of gas and fluid is seen downstream from this point. The stomach is unremarkable. Mild sigmoid and descending colonic diverticulosis. The large bowel is otherwise unremarkable. Appendix normal. No free intraperitoneal gas or fluid. Vascular/Lymphatic: The abdominal vasculature is age-appropriate with mild aortoiliac atherosclerotic calcification. No aneurysm. No pathologic adenopathy within the abdomen and pelvis. Reproductive: Prostate is unremarkable. Penile prosthesis is in place with the reservoir noted within the left inferior rectus sheath. Other: Tiny right spigelian hernia is seen with herniation of a small amount of mesenteric fat. Musculoskeletal: L4-5 lumbar fusion with instrumentation has been performed. Dorsal column stimulator leads enter the spinal canal at L1-2 with the power pack placed within the subcutaneous fat of the left gluteal region. No acute bone abnormality. IMPRESSION: 1. High-grade partial or developing complete mid small bowel obstruction with a point of transition seen within the right mid abdomen. 2. Mild hepatic steatosis. Aortic Atherosclerosis (ICD10-I70.0). Electronically Signed   By: Helyn Numbers MD   On: 04/06/2020 22:31   Acute Abdominal Series  Result Date: 04/15/2020 CLINICAL DATA:  Patient admitted for small bowel obstruction last week. Nausea and diarrhea. EXAM: DG ABDOMEN ACUTE W/ 1V CHEST COMPARISON:  April 09, 2020 FINDINGS: Streaky opacity in left base is favored represent atelectasis. The heart, hila, mediastinum, lungs, and pleura are otherwise unremarkable. No free air, portal venous gas, or pneumatosis. No bowel dilatation identified. Gas is seen in the colon to the level the rectum. There are some air-fluid levels in the  ascending colon on the upright view consistent with the history of diarrhea. No other abnormalities. IMPRESSION: No bowel obstruction. Air-fluid levels in the ascending colon and a normal caliber few small bowel loops consistent with history of diarrhea. Electronically Signed   By: Gerome Sam III M.D   On: 04/15/2020 11:02   DG Abd Portable 1V  Result Date: 04/08/2020 CLINICAL DATA:  Increased bloating with nausea and diarrhea. EXAM: PORTABLE ABDOMEN - 1 VIEW COMPARISON:  04/07/2020 FINDINGS: Interval removal of nasogastric tube. Air is present within the large and small bowel. There are several dilated air-filled small bowel loops in the mid to lower central abdomen measuring up to 4.8 cm in diameter. No free peritoneal air. Remainder of the exam is unchanged. IMPRESSION: Several dilated air-filled small bowel loops in the mid to lower abdomen with air present in the large and small bowel. Findings may be due to ileus versus early/partial small bowel obstruction. Electronically Signed   By: Elberta Fortis M.D.   On: 04/08/2020 11:45   DG Abd Portable 1V-Small Bowel Obstruction Protocol-initial, 8 hr delay  Result Date: 04/07/2020 CLINICAL DATA:  Small-bowel obstruction. EXAM: PORTABLE ABDOMEN - 1 VIEW COMPARISON:  04/07/2020 abdominal radiographs and prior. FINDINGS: Stable positioning of enteric tube and spinal stimulator. Enteric contrast opacifies the large bowel to the level of the rectum. Gas is seen  within nondilated small bowel loops. Sequela of lumbar spinal fixation. No acute osseous abnormality. IMPRESSION: No dilated bowel loops or air-fluid levels. Enteric contrast opacifies the large bowel to level the rectum. Indwelling enteric tube and spinal stimulator, unchanged. Electronically Signed   By: Stana Buntinghikanele  Emekauwa M.D.   On: 04/07/2020 17:54   DG Abd Portable 1V-Small Bowel Protocol-Position Verification  Result Date: 04/07/2020 CLINICAL DATA:  Check gastric catheter placement EXAM:  PORTABLE ABDOMEN - 1 VIEW COMPARISON:  04/06/2020 FINDINGS: Gastric catheter is again noted within the stomach directed towards the antrum. Spinal stimulator is seen. No free air is noted. No obstructive changes are noted. IMPRESSION: Stable gastric catheter within the stomach. Electronically Signed   By: Alcide CleverMark  Lukens M.D.   On: 04/07/2020 08:35    Microbiology: Recent Results (from the past 240 hour(s))  SARS Coronavirus 2 by RT PCR (hospital order, performed in [facility] hospital lab) Nasopharyngeal Nasopharyngeal Swab     Status: None   Collection Time: 04/14/20  4:29 PM   Specimen: Nasopharyngeal Swab  Result Value Ref Range Status   SARS Coronavirus 2 NEGATIVE NEGATIVE Final    Comment: (NOTE) SARS-CoV-2 target nucleic acids are NOT DETECTED.  The SARS-CoV-2 RNA is generally detectable in upper and lower respiratory specimens during the acute phase of infection. The lowest concentration of SARS-CoV-2 viral copies this assay can detect is 250 copies / mL. A negative result does not preclude SARS-CoV-2 infection and should not be used as the sole basis for treatment or other patient management decisions.  A negative result may occur with improper specimen collection / handling, submission of specimen other than nasopharyngeal swab, presence of viral mutation(s) within the areas targeted by this assay, and inadequate number of viral copies (<250 copies / mL). A negative result must be combined with clinical observations, patient history, and epidemiological information.  Fact Sheet for Patients:   BoilerBrush.com.cyhttps://www.fda.gov/media/136312/download  Fact Sheet for Healthcare Providers: https://pope.com/https://www.fda.gov/media/136313/download  This test is not yet approved or  cleared by the [place] FDA and has been authorized for detection and/or diagnosis of SARS-CoV-2 by FDA under an Emergency Use Authorization (EUA).  This EUA will remain in effect (meaning this test can be used) for the  duration of the COVID-19 declaration under Section 564(b)(1) of the Act, 21 U.S.C. section 360bbb-3(b)(1), unless the authorization is terminated or revoked sooner.  Performed at [facility], [address] [place], [place] [address]   Culture, blood (routine x 2)     Status: None (Preliminary result)   Collection Time: 04/14/20  9:08 PM   Specimen: BLOOD  Result Value Ref Range Status   Specimen Description   Final    BLOOD LEFT ARM Performed at [facility], [address] [place], [place] [address]    Special Requests   Final    BOTTLES DRAWN AEROBIC AND ANAEROBIC Blood Culture adequate volume Performed at [facility], [address] [place], [place] [address]    Culture   Final    NO GROWTH 3 DAYS Performed at [facility], [address] [place], [place] [address]    Report Status PENDING  Incomplete  Culture, blood (routine x 2)     Status: None (Preliminary result)   Collection Time: 04/14/20  9:08 PM   Specimen: BLOOD  Result Value Ref Range Status   Specimen Description   Final    BLOOD RIGHT ARM Performed at [facility], [address]  [place] [place], [place] [address]    Special Requests   Final    BOTTLES DRAWN AEROBIC ONLY Blood Culture adequate volume Performed at [facility], [address], [place], [place] [address]    Culture   Final    NO GROWTH 3 DAYS Performed at [facility], [address], [place], [place] [address]    Report Status PENDING  Incomplete  C Difficile Quick Screen w PCR reflex     Status: None   Collection Time: 04/15/20  6:47 AM   Specimen: STOOL  Result Value Ref Range Status   C Diff antigen NEGATIVE NEGATIVE Final   C Diff toxin NEGATIVE NEGATIVE Final   C Diff interpretation No C. difficile detected.  Final    Comment: Performed at [facility], [address] [place], [place] [address]      Labs: Basic Metabolic Panel: Recent Labs  Lab 04/10/20 1503 04/11/20 0516 04/14/20 1332 04/15/20 0543 04/16/20 0607  NA 141 137 138 140 138  K 4.1 3.8 4.3 3.7 3.2*  CL 104 104 102 104 103  CO2 26 24 23 28 27   GLUCOSE 140* 147* 215* 136* 103*  BUN <5* <5* 10 8 <5*  CREATININE 0.76 0.86 0.72 0.99 0.89  CALCIUM 8.7* 8.5* 8.8* 7.7* 8.2*  MG 2.3  --   --  1.9 1.9   Liver Function Tests: Recent Labs  Lab 04/10/20 1503 04/11/20 0516 04/14/20 1332  AST 23 22 28   ALT 15 15 17   ALKPHOS 60 60 70  BILITOT 0.7 0.6 0.9  PROT 7.2 6.8 8.0  ALBUMIN 3.9 3.7 4.1   Recent Labs  Lab 04/14/20 1332  LIPASE 50   No results for input(s): AMMONIA in the last 168 hours. CBC: Recent Labs  Lab 04/10/20 1503 04/11/20 0516 04/14/20 1332 04/15/20 0543 04/16/20 0607  WBC 5.1 6.0 12.4* 6.1 5.4  NEUTROABS  --   --   --   --  3.6  HGB 14.5 14.2 16.5 13.9 13.5  HCT 46.8 47.1 53.6* 46.1 43.8  MCV 85.7 85.9 83.6 86.3 85.4  PLT 289 274 351 299 263   Cardiac Enzymes: No results for input(s): CKTOTAL, CKMB, CKMBINDEX, TROPONINI in the last 168 hours. BNP: BNP (last 3 results) No results for input(s): BNP in the last 8760 hours.  ProBNP (last 3 results) No results for input(s): PROBNP in the last 8760 hours.  CBG: Recent Labs  Lab 04/16/20 1550 04/16/20 1953 04/17/20 0001 04/17/20 0515 04/17/20 0740  GLUCAP 135* 173* 114* 105* 110*       Signed:  04/19/20, MD [facility] 04/17/2020, 11:06 AM

## 2020-04-19 LAB — CULTURE, BLOOD (ROUTINE X 2)
Culture: NO GROWTH
Culture: NO GROWTH
Special Requests: ADEQUATE
Special Requests: ADEQUATE

## 2020-06-02 ENCOUNTER — Ambulatory Visit (INDEPENDENT_AMBULATORY_CARE_PROVIDER_SITE_OTHER): Payer: No Typology Code available for payment source | Admitting: Family Medicine

## 2020-06-02 ENCOUNTER — Encounter: Payer: Self-pay | Admitting: Family Medicine

## 2020-06-02 DIAGNOSIS — K56609 Unspecified intestinal obstruction, unspecified as to partial versus complete obstruction: Secondary | ICD-10-CM | POA: Diagnosis not present

## 2020-06-02 DIAGNOSIS — Z9689 Presence of other specified functional implants: Secondary | ICD-10-CM | POA: Diagnosis not present

## 2020-06-02 DIAGNOSIS — I152 Hypertension secondary to endocrine disorders: Secondary | ICD-10-CM

## 2020-06-02 DIAGNOSIS — E1169 Type 2 diabetes mellitus with other specified complication: Secondary | ICD-10-CM | POA: Diagnosis not present

## 2020-06-02 DIAGNOSIS — E1159 Type 2 diabetes mellitus with other circulatory complications: Secondary | ICD-10-CM | POA: Diagnosis not present

## 2020-06-02 DIAGNOSIS — F3341 Major depressive disorder, recurrent, in partial remission: Secondary | ICD-10-CM

## 2020-06-02 MED ORDER — SEMAGLUTIDE-WEIGHT MANAGEMENT 2.4 MG/0.75ML ~~LOC~~ SOAJ: 2.4000 mg | SUBCUTANEOUS | 1 refills | Status: DC

## 2020-06-02 MED ORDER — SEMAGLUTIDE-WEIGHT MANAGEMENT 0.25 MG/0.5ML ~~LOC~~ SOAJ: 0.2500 mg | SUBCUTANEOUS | 0 refills | Status: AC

## 2020-06-02 MED ORDER — SEMAGLUTIDE-WEIGHT MANAGEMENT 1.7 MG/0.75ML ~~LOC~~ SOAJ: 1.7000 mg | SUBCUTANEOUS | 0 refills | Status: DC

## 2020-06-02 MED ORDER — SEMAGLUTIDE-WEIGHT MANAGEMENT 1 MG/0.5ML ~~LOC~~ SOAJ: 1.0000 mg | SUBCUTANEOUS | 0 refills | Status: DC

## 2020-06-02 MED ORDER — TRESIBA 100 UNIT/ML ~~LOC~~ SOLN: 60.0000 [IU] | Freq: Every morning | SUBCUTANEOUS | 1 refills | Status: DC

## 2020-06-02 MED ORDER — SEMAGLUTIDE-WEIGHT MANAGEMENT 0.5 MG/0.5ML ~~LOC~~ SOAJ: 0.5000 mg | SUBCUTANEOUS | 0 refills | Status: AC

## 2020-06-02 NOTE — Patient Instructions (Addendum)
Start wegovy weekly Lab Results  Component Value Date   HGBA1C 7.2 (H) 04/07/2020  Continue other medications.  See me again in 3 months.

## 2020-06-04 DIAGNOSIS — F324 Major depressive disorder, single episode, in partial remission: Secondary | ICD-10-CM | POA: Insufficient documentation

## 2020-06-04 NOTE — Assessment & Plan Note (Addendum)
Diabetes is fairly well controlled at this time.   Will continue current medications.   Previously on ozempic and was being transitioned to [facility]. Reasonable to change if insurance will cover. Tresiba refilled today.

## 2020-06-04 NOTE — Assessment & Plan Note (Signed)
Well controlled at this time with bupropion and abilifty.  Recommend continuation.

## 2020-06-04 NOTE — Assessment & Plan Note (Signed)
Chronic pain remains well managed with spinal cord stimulator.

## 2020-06-04 NOTE — Assessment & Plan Note (Signed)
BP elevated today however has not taken amlodipine.  BP readings from home are well controlled.  Continue amlodipine at current dose.

## 2020-06-04 NOTE — Progress Notes (Signed)
Brandon [name] - 64 y.o. male MRN [id number]  Date of birth: 22-Jun-1956  Subjective Chief Complaint  Patient presents with  . Establish Care    HPI Brandon [name] is 64 y.o. male here today for initial visit.  He recently move to the area from [place] but has lived here previously several years ago.  He has a history of HTN, depression, T2DM, and HLD.  He was recently hospitalized for SBO x2 but this has resolved.  He denies any residual symptoms from this.  His bowels are moving normally at this time.   His diabetes is treated with a combination of jardiance, tresiba, and actos.  Brandon [name] is currently at 60 units daily.  He was also taking Ozempic however he reports that his PCP in [place] was going to change him to [facility] to help with better weight management.  He does report that he has been poorly compliant with diet and medication regimen in the past but has been trying to do better recently.  His recent a1c in the hospital was 7.2%.   HTN is managed with amlodipine.  He is tolerating this well and denies side effects.  He has not had any symptoms related to HTN including chest pain, shortness of breath, palpitations, headache or vision changes.   He is managing depression with bupropion 450mg  daily as well as abilify.  He is tolerating well without side effects at this time.    Chronic low back pain with spinal stimulator.  Fairly well controlled at this time.    ROS:  A comprehensive ROS was completed and negative except as noted per HPI    Allergies  Allergen Reactions  . Kiwi Extract Swelling    Mouth swelling.   . Other Swelling    EGGPLANT. Mouth swelling.  Penicillins Rash  . Ancef [Cefazolin] Rash  . Latex Rash  . Levaquin [Levofloxacin] Rash    Past Medical History:  Diagnosis Date  . Anxiety   . Basal cell carcinoma (BCC) in situ of skin   . Colon polyps   . Depression   . Diabetes mellitus without complication (HCC)   . Hypertension   . SBO (small bowel  obstruction) (HCC)     Past Surgical History:  Procedure Laterality Date  . BACK SURGERY    . CATARACT EXTRACTION W/ INTRAOCULAR LENS  IMPLANT, BILATERAL    . CHOLECYSTECTOMY    . CHOLECYSTECTOMY, LAPAROSCOPIC    . EYE SURGERY    . LUMBAR FUSION     L3-5 with rods  . XI ROBOTIC ASSISTED INGUINAL HERNIA REPAIR WITH MESH     x 3 surgeries    Social History   Socioeconomic History  . Marital status: Married    Spouse name: Not on file  . Number of children: Not on file  . Years of education: Not on file  . Highest education level: Not on file  Occupational History  . Not on file  Tobacco Use  . Smoking status: Former [employer]  . Smokeless tobacco: Never Used  Substance and Sexual Activity  . Alcohol use: Not Currently  . Drug use: Not Currently  . Sexual activity: Yes  Other Topics Concern  . Not on file  Social History Narrative  . Not on file   Social Determinants of Health   Financial Resource Strain:   . Difficulty of Paying Living Expenses: Not on file  Food Insecurity:   . Worried About Games developer in the Last Year:  Not on file  . Ran Out of Food in the Last Year: Not on file  Transportation Needs:   . Lack of Transportation (Medical): Not on file  . Lack of Transportation (Non-Medical): Not on file  Physical Activity:   . Days of Exercise per Week: Not on file  . Minutes of Exercise per Session: Not on file  Stress:   . Feeling of Stress : Not on file  Social Connections:   . Frequency of Communication with Friends and Family: Not on file  . Frequency of Social Gatherings with Friends and Family: Not on file  . Attends Religious Services: Not on file  . Active Member of Clubs or Organizations: Not on file  . Attends Banker Meetings: Not on file  . Marital Status: Not on file    Family History  Problem Relation Age of Onset  . COPD Mother   . Anxiety disorder Mother   . Depression Mother   . Cancer Father   . Anxiety disorder  Father   . Depression Father   . Other Father     Health Maintenance  Topic Date Due  . Hepatitis C Screening  Never done  . FOOT EXAM  Never done  . OPHTHALMOLOGY EXAM  Never done  . URINE MICROALBUMIN  Never done  . TETANUS/TDAP  Never done  . COLONOSCOPY  Never done  . INFLUENZA VACCINE  03/26/2020  . COVID-19 Vaccine (2 - Pfizer 2-dose series) 06/15/2020  . HEMOGLOBIN A1C  10/08/2020  . HIV Screening  Completed     ----------------------------------------------------------------------------------------------------------------------------------------------------------------------------------------------------------------- Physical Exam BP (!) 150/74 (BP Location: Left Arm, Patient Position: Sitting, Cuff Size: Large)   Pulse 99   Wt 227 lb 9.6 oz (103.2 kg)   SpO2 97%   BMI 33.61 kg/m   Physical Exam Constitutional:      Appearance: Normal appearance.  HENT:     Head: Normocephalic and atraumatic.  Eyes:     General: No scleral icterus. Cardiovascular:     Rate and Rhythm: Normal rate and regular rhythm.  Pulmonary:     Effort: Pulmonary effort is normal.     Breath sounds: Normal breath sounds.  Musculoskeletal:     Cervical back: Neck supple.  Neurological:     General: No focal deficit present.     Mental Status: He is alert.  Psychiatric:        Mood and Affect: Mood normal.        Behavior: Behavior normal.     ------------------------------------------------------------------------------------------------------------------------------------------------------------------------------------------------------------------- Assessment and Plan  Hypertension associated with diabetes (HCC) BP elevated today however has not taken amlodipine.  BP readings from home are well controlled.  Continue amlodipine at current dose.    SBO (small bowel obstruction) (HCC) Hospitalized x2 recently.  Resolved at this time, no continued symptoms.   Type 2 diabetes  mellitus with other specified complication (HCC) Diabetes is fairly well controlled at this time.   Will continue current medications.   Previously on ozempic and was being transitioned to [facility]. Reasonable to change if insurance will cover. Tresiba refilled today.      Status post insertion of spinal cord stimulator Chronic pain remains well managed with spinal cord stimulator.   Major depression in partial remission (HCC) Well controlled at this time with bupropion and abilifty.  Recommend continuation.    Meds ordered this encounter  Medications  . Insulin Degludec (TRESIBA) 100 UNIT/ML SOLN    Sig: Inject 60 Units into the skin every morning.  Dispense:  60 mL    Refill:  1  . Semaglutide-Weight Management 0.25 MG/0.5ML SOAJ    Sig: Inject 0.25 mg into the skin once a week for 28 days.    Dispense:  2 mL    Refill:  0  . Semaglutide-Weight Management 0.5 MG/0.5ML SOAJ    Sig: Inject 0.5 mg into the skin once a week for 28 days.    Dispense:  2 mL    Refill:  0  . DISCONTD: Semaglutide-Weight Management 1 MG/0.5ML SOAJ    Sig: Inject 1 mg into the skin once a week for 28 days.    Dispense:  2 mL    Refill:  0  . DISCONTD: Semaglutide-Weight Management 1.7 MG/0.75ML SOAJ    Sig: Inject 1.7 mg into the skin once a week for 28 days.    Dispense:  3 mL    Refill:  0  . DISCONTD: Semaglutide-Weight Management 2.4 MG/0.75ML SOAJ    Sig: Inject 2.4 mg into the skin once a week for 28 days.    Dispense:  3 mL    Refill:  1    Return in about 3 months (around 09/02/2020) for DM.    This visit occurred during the SARS-CoV-2 public health emergency.  Safety protocols were in place, including screening questions prior to the visit, additional usage of staff PPE, and extensive cleaning of exam room while observing appropriate contact time as indicated for disinfecting solutions.

## 2020-06-04 NOTE — Assessment & Plan Note (Signed)
Hospitalized x2 recently.  Resolved at this time, no continued symptoms.

## 2020-06-07 ENCOUNTER — Telehealth: Payer: Self-pay

## 2020-06-07 NOTE — Telephone Encounter (Signed)
Lc states the [place] was sent in as a vial instead of a pen.

## 2020-06-08 ENCOUNTER — Other Ambulatory Visit: Payer: Self-pay | Admitting: Family Medicine

## 2020-06-08 MED ORDER — TRESIBA FLEXTOUCH 100 UNIT/ML ~~LOC~~ SOPN: 60.0000 [IU] | PEN_INJECTOR | Freq: Every day | SUBCUTANEOUS | 1 refills | Status: AC

## 2020-06-08 NOTE — Telephone Encounter (Signed)
Patient advised.

## 2020-06-08 NOTE — Telephone Encounter (Signed)
Updated to pen.

## 2020-06-22 ENCOUNTER — Ambulatory Visit (INDEPENDENT_AMBULATORY_CARE_PROVIDER_SITE_OTHER): Payer: No Typology Code available for payment source | Admitting: Family Medicine

## 2020-06-22 ENCOUNTER — Encounter: Payer: Self-pay | Admitting: Family Medicine

## 2020-06-22 ENCOUNTER — Other Ambulatory Visit: Payer: Self-pay

## 2020-06-22 VITALS — BP 128/77 | HR 92 | Temp 98.2°F | Wt 227.0 lb

## 2020-06-22 DIAGNOSIS — M47816 Spondylosis without myelopathy or radiculopathy, lumbar region: Secondary | ICD-10-CM | POA: Insufficient documentation

## 2020-06-22 DIAGNOSIS — M533 Sacrococcygeal disorders, not elsewhere classified: Secondary | ICD-10-CM | POA: Diagnosis not present

## 2020-06-22 DIAGNOSIS — G8929 Other chronic pain: Secondary | ICD-10-CM | POA: Diagnosis not present

## 2020-06-22 DIAGNOSIS — M961 Postlaminectomy syndrome, not elsewhere classified: Secondary | ICD-10-CM | POA: Insufficient documentation

## 2020-06-22 DIAGNOSIS — R109 Unspecified abdominal pain: Secondary | ICD-10-CM | POA: Diagnosis not present

## 2020-06-22 LAB — POCT URINALYSIS DIP (CLINITEK)
Bilirubin, UA: NEGATIVE
Blood, UA: NEGATIVE
Glucose, UA: 500 mg/dL — AB
Ketones, POC UA: NEGATIVE mg/dL
Leukocytes, UA: NEGATIVE
Nitrite, UA: NEGATIVE
POC PROTEIN,UA: NEGATIVE
Spec Grav, UA: 1.015 (ref 1.010–1.025)
Urobilinogen, UA: 0.2 E.U./dL
pH, UA: 8 (ref 5.0–8.0)

## 2020-06-22 MED ORDER — KETOROLAC TROMETHAMINE 60 MG/2ML IM SOLN
60.0000 mg | Freq: Once | INTRAMUSCULAR | Status: AC
  Administered 2020-06-22: 60 mg via INTRAMUSCULAR

## 2020-06-22 NOTE — Assessment & Plan Note (Signed)
Pain seem to be focused over L SI joint. Normal UA.  Toradol 60mg   injection given today.  He has had good relief with SI joint injection previously.  He will schedule with Dr. to discuss this.

## 2020-06-22 NOTE — Patient Instructions (Signed)
Sacroiliac Joint Dysfunction  Sacroiliac joint dysfunction is a condition that causes inflammation on one or both sides of the sacroiliac (SI) joint. The SI joint connects the lower part of the spine (sacrum) with the two upper portions of the pelvis (ilium). This condition causes deep aching or burning pain in the low back. In some cases, the pain may also spread into one or both buttocks, hips, or thighs. What are the causes? This condition may be caused by:  Pregnancy. During pregnancy, extra stress is put on the SI joints because the pelvis widens.  Injury, such as: ? Injuries from car accidents. ? Sports-related injuries. ? Work-related injuries.  Having one leg that is shorter than the other.  Conditions that affect the joints, such as: ? Rheumatoid arthritis. ? Gout. ? Psoriatic arthritis. ? Joint infection (septic arthritis). Sometimes, the cause of SI joint dysfunction is not known. What are the signs or symptoms? Symptoms of this condition include:  Aching or burning pain in the lower back. The pain may also spread to other areas, such as: ? Buttocks. ? Groin. ? Thighs.  Muscle spasms in or around the painful areas.  Increased pain when standing, walking, running, stair climbing, bending, or lifting. How is this diagnosed? This condition is diagnosed with a physical exam and medical history. During the exam, the health care provider may move one or both of your legs to different positions to check for pain. Various tests may be done to confirm the diagnosis, including:  Imaging tests to look for other causes of pain. These may include: ? MRI. ? CT scan. ? Bone scan.  Diagnostic injection. A numbing medicine is injected into the SI joint using a needle. If your pain is temporarily improved or stopped after the injection, this can indicate that SI joint dysfunction is the problem. How is this treated? Treatment depends on the cause and severity of your condition.  Treatment options may include:  Ice or heat applied to the lower back area after an injury. This may help reduce pain and muscle spasms.  Medicines to relieve pain or inflammation or to relax the muscles.  Wearing a back brace (sacroiliac brace) to help support the joint while your back is healing.  Physical therapy to increase muscle strength around the joint and flexibility at the joint. This may also involve learning proper body positions and ways of moving to relieve stress on the joint.  Direct manipulation of the SI joint.  Injections of steroid medicine into the joint to reduce pain and swelling.  Radiofrequency ablation to burn away nerves that are carrying pain messages from the joint.  Use of a device that provides electrical stimulation to help reduce pain at the joint.  Surgery to put in screws and plates that limit or prevent joint motion. This is rare. Follow these instructions at home: Medicines  Take over-the-counter and prescription medicines only as told by your health care provider.  Do not drive or use heavy machinery while taking prescription pain medicine.  If you are taking prescription pain medicine, take actions to prevent or treat constipation. Your health care provider may recommend that you: ? Drink enough fluid to keep your urine pale yellow. ? Eat foods that are high in fiber, such as fresh fruits and vegetables, whole grains, and beans. ? Limit foods that are high in fat and processed sugars, such as fried or sweet foods. ? Take an over-the-counter or prescription medicine for constipation. If you have a brace:    Wear the brace as told by your health care provider. Remove it only as told by your health care provider.  Keep the brace clean.  If the brace is not waterproof: ? Do not let it get wet. ? Cover it with a watertight covering when you take a bath or a shower. Managing pain, stiffness, and swelling      Icing can help with pain and  swelling. Heat may help with muscle tension or spasms. Ask your health care provider if you should use ice or heat.  If directed, put ice on the affected area: ? If you have a removable brace, remove it as told by your health care provider. ? Put ice in a plastic bag. ? Place a towel between your skin and the bag. ? Leave the ice on for 20 minutes, 2-3 times a day.  If directed, apply heat to the affected area. Use the heat source that your health care provider recommends, such as a moist heat pack or a heating pad. ? Place a towel between your skin and the heat source. ? Leave the heat on for 20-30 minutes. ? Remove the heat if your skin turns bright red. This is especially important if you are unable to feel pain, heat, or cold. You may have a greater risk of getting burned. General instructions  Rest as needed. Ask your health care provider what activities are safe for you.  Return to your normal activities as told by your health care provider.  Exercise as directed by your health care provider or physical therapist.  Do not use any products that contain nicotine or tobacco, such as cigarettes and e-cigarettes. These can delay bone healing. If you need help quitting, ask your health care provider.  Keep all follow-up visits as told by your health care provider. This is important. Contact a health care provider if:  Your pain is not controlled with medicine.  You have a fever.  Your pain is getting worse. Get help right away if:  You have weakness, numbness, or tingling in your legs or feet.  You lose control of your bladder or bowel. Summary  Sacroiliac joint dysfunction is a condition that causes inflammation on one or both sides of the sacroiliac (SI) joint.  This condition causes deep aching or burning pain in the low back. In some cases, the pain may also spread into one or both buttocks, hips, or thighs.  Treatment depends on the cause and severity of your condition.  It may include medicines to reduce pain and swelling or to relax muscles. This information is not intended to replace advice given to you by your health care provider. Make sure you discuss any questions you have with your health care provider. Document Revised: 04/08/2018 Document Reviewed: 09/22/2017 Elsevier Patient Education  2020 Elsevier Inc.  

## 2020-06-22 NOTE — Progress Notes (Signed)
Brandon [name] - 64 y.o. male MRN [id number]  Date of birth: May 03, 1956  Subjective Chief Complaint  Patient presents with  . Back Pain  . Testicle Pain    HPI Brandon [name] is a 64 y.o. male here today with complaint of low back pain.  He has history of lumbar spine surgery with harrington rods.  He has had problems with SI joint as well and reports that current pain feels similar to pain he has experienced with SI joint in the past.  Current pain is more on the left with radiation around the hip and into the groin area. His pain is worse with movement and when first getting up and walking.  Describes as feeling like "having a screwdriver" wedged into this posterior hip area.  He denies any radicular symptoms, fever, chills.   ROS:  A comprehensive ROS was completed and negative except as noted per HPI  Allergies  Allergen Reactions  . Kiwi Extract Swelling    Mouth swelling.   . Other Swelling    EGGPLANT. Mouth swelling.  Marland [employer] Penicillins Rash  . Ancef [Cefazolin] Rash  . Latex Rash  . Levaquin [Levofloxacin] Rash    Past Medical History:  Diagnosis Date  . Anxiety   . Basal cell carcinoma (BCC) in situ of skin   . Colon polyps   . Depression   . Diabetes mellitus without complication (HCC)   . Hypertension   . SBO (small bowel obstruction) (HCC)     Past Surgical History:  Procedure Laterality Date  . BACK SURGERY    . CATARACT EXTRACTION W/ INTRAOCULAR LENS  IMPLANT, BILATERAL    . CHOLECYSTECTOMY    . CHOLECYSTECTOMY, LAPAROSCOPIC    . EYE SURGERY    . LUMBAR FUSION     L3-5 with rods  . XI ROBOTIC ASSISTED INGUINAL HERNIA REPAIR WITH MESH     x 3 surgeries    Social History   Socioeconomic History  . Marital status: Married    Spouse name: Not on file  . Number of children: Not on file  . Years of education: Not on file  . Highest education level: Not on file  Occupational History  . Not on file  Tobacco Use  . Smoking status: Former Games developer  .  Smokeless tobacco: Never Used  Substance and Sexual Activity  . Alcohol use: Not Currently  . Drug use: Not Currently  . Sexual activity: Yes  Other Topics Concern  . Not on file  Social History Narrative  . Not on file   Social Determinants of Health   Financial Resource Strain:   . Difficulty of Paying Living Expenses: Not on file  Food Insecurity:   . Worried About Programme researcher, broadcasting/film/video in the Last Year: Not on file  . Ran Out of Food in the Last Year: Not on file  Transportation Needs:   . Lack of Transportation (Medical): Not on file  . Lack of Transportation (Non-Medical): Not on file  Physical Activity:   . Days of Exercise per Week: Not on file  . Minutes of Exercise per Session: Not on file  Stress:   . Feeling of Stress : Not on file  Social Connections:   . Frequency of Communication with Friends and Family: Not on file  . Frequency of Social Gatherings with Friends and Family: Not on file  . Attends Religious Services: Not on file  . Active Member of Clubs or Organizations: Not on file  .  Attends Banker Meetings: Not on file  . Marital Status: Not on file    Family History  Problem Relation Age of Onset  . COPD Mother   . Anxiety disorder Mother   . Depression Mother   . Cancer Father   . Anxiety disorder Father   . Depression Father   . Other Father     Health Maintenance  Topic Date Due  . Hepatitis C Screening  Never done  . FOOT EXAM  Never done  . OPHTHALMOLOGY EXAM  Never done  . URINE MICROALBUMIN  Never done  . TETANUS/TDAP  Never done  . COLONOSCOPY  Never done  . INFLUENZA VACCINE  03/26/2020  . HEMOGLOBIN A1C  10/08/2020  . COVID-19 Vaccine  Completed  . HIV Screening  Completed     ----------------------------------------------------------------------------------------------------------------------------------------------------------------------------------------------------------------- Physical Exam BP 128/77 (BP  Location: Left Arm, Patient Position: Sitting, Cuff Size: Large)   Pulse 92   Temp 98.2 F (36.8 C) (Oral)   Wt 227 lb (103 kg)   SpO2 96%   BMI 33.52 kg/m   Physical Exam Constitutional:      Appearance: Normal appearance.  Abdominal:     General: Abdomen is flat. There is no distension.     Palpations: Abdomen is soft.     Tenderness: There is no abdominal tenderness.     Hernia: No hernia is present.  Musculoskeletal:     Comments: TTP along L SI joint.  Pain with FABER test.   Neurological:     Mental Status: He is alert.  Psychiatric:        Mood and Affect: Mood normal.        Behavior: Behavior normal.     ------------------------------------------------------------------------------------------------------------------------------------------------------------------------------------------------------------------- Assessment and Plan  Chronic left SI joint pain Pain seem to be focused over L SI joint. Normal UA.  Toradol 60mg   injection given today.  He has had good relief with SI joint injection previously.  He will schedule with Dr. to discuss this.    Meds ordered this encounter  Medications  . ketorolac (TORADOL) injection 60 mg    No follow-ups on file.    This visit occurred during the SARS-CoV-2 public health emergency.  Safety protocols were in place, including screening questions prior to the visit, additional usage of staff PPE, and extensive cleaning of exam room while observing appropriate contact time as indicated for disinfecting solutions.

## 2020-06-23 ENCOUNTER — Telehealth: Payer: Self-pay

## 2020-06-23 MED ORDER — KETOROLAC TROMETHAMINE 10 MG PO TABS: 10.0000 mg | ORAL_TABLET | Freq: Four times a day (QID) | ORAL | 0 refills | Status: AC | PRN

## 2020-06-23 NOTE — Telephone Encounter (Signed)
Pt called stating 5-day course of toradol was unavailable at the pharmacy.   No orders have been placed.  Sent request to Dr. Ashley Royalty.

## 2020-06-23 NOTE — Telephone Encounter (Signed)
Rx for toradol sent in.

## 2020-06-26 ENCOUNTER — Ambulatory Visit (INDEPENDENT_AMBULATORY_CARE_PROVIDER_SITE_OTHER): Payer: No Typology Code available for payment source

## 2020-06-26 ENCOUNTER — Other Ambulatory Visit: Payer: Self-pay

## 2020-06-26 ENCOUNTER — Ambulatory Visit (INDEPENDENT_AMBULATORY_CARE_PROVIDER_SITE_OTHER): Payer: No Typology Code available for payment source | Admitting: Sports Medicine

## 2020-06-26 DIAGNOSIS — M961 Postlaminectomy syndrome, not elsewhere classified: Secondary | ICD-10-CM

## 2020-06-26 DIAGNOSIS — G8929 Other chronic pain: Secondary | ICD-10-CM

## 2020-06-26 DIAGNOSIS — M533 Sacrococcygeal disorders, not elsewhere classified: Secondary | ICD-10-CM | POA: Diagnosis not present

## 2020-06-26 NOTE — Progress Notes (Signed)
    Procedures performed today:    Procedure: Real-time Ultrasound Guided injection of the left sacroiliac joint Device: Samsung HS60  Verbal informed consent obtained.  Time-out conducted.  Noted no overlying erythema, induration, or other signs of local infection.  Skin prepped in a sterile fashion.  Local anesthesia: Topical Ethyl chloride.  With sterile technique and under real time ultrasound guidance:  Noted normal-appearing SI joint.  1 cc Kenalog 40, 2 cc lidocaine, 2 cc bupivacaine injected easily Completed without difficulty  Pain immediately resolved suggesting accurate placement of the medication.  Advised to call if fevers/chills, erythema, induration, drainage, or persistent bleeding.  Images permanently stored and available for review in PACS.  Impression: Technically successful ultrasound guided injection.  Procedure: Real-time Ultrasound Guided injection of the right sacroiliac joint Device: Samsung HS60  Verbal informed consent obtained.  Time-out conducted.  Noted no overlying erythema, induration, or other signs of local infection.  Skin prepped in a sterile fashion.  Local anesthesia: Topical Ethyl chloride.  With sterile technique and under real time ultrasound guidance:  Noted normal-appearing SI joint.  1 cc Kenalog 40, 2 cc lidocaine, 2 cc bupivacaine injected easily Completed without difficulty  Pain immediately resolved suggesting accurate placement of the medication.  Advised to call if fevers/chills, erythema, induration, drainage, or persistent bleeding.  Images permanently stored and available for review in PACS.  Impression: Technically successful ultrasound guided injection.  Independent interpretation of notes and tests performed by another provider:   None.  Brief History, Exam, Impression, and Recommendations:    Failed back syndrome of lumbar spine This is a very pleasant 64 year old male, he has had multiple interventions into his lumbar  spine including a lumbar fusion. Unfortunately continues to have pain.  He did have a spinal cord stimulator implanted which provided good relief and the ability to come off of his narcotics. Still has some pain, localized left worse than right sacroiliac joint, this is responded well to SI joint injections in the past. Today we repeated bilateral SI joint injections with information provided on SI joint radiofrequency ablation, return to see me in 1 month, SI joint rehabilitation exercises given as well.  Of note he did want to discuss SI RFA with Dr. Laurian Brim, so I am happy to set him up simply with a consultation but not necessarily for the procedure.    ___________________________________________ Ihor Austin. Benjamin Stain, M.D., ABFM., CAQSM. Primary Care and Sports Medicine [facility]  Adjunct Instructor of Family Medicine  [employer]

## 2020-06-26 NOTE — Assessment & Plan Note (Addendum)
This is a very pleasant 64 year old male, he has had multiple interventions into his lumbar spine including a lumbar fusion. Unfortunately continues to have pain.  He did have a spinal cord stimulator implanted which provided good relief and the ability to come off of his narcotics. Still has some pain, localized left worse than right sacroiliac joint, this is responded well to SI joint injections in the past. Today we repeated bilateral SI joint injections with information provided on SI joint radiofrequency ablation, return to see me in 1 month, SI joint rehabilitation exercises given as well.  Of note he did want to discuss SI RFA, so I am happy to set him up simply with a consultation but not necessarily for the procedure.

## 2020-06-29 ENCOUNTER — Telehealth: Payer: Self-pay

## 2020-06-29 DIAGNOSIS — M961 Postlaminectomy syndrome, not elsewhere classified: Secondary | ICD-10-CM

## 2020-06-29 NOTE — Telephone Encounter (Signed)
Patient called to report that she got no relief from the SI joint injection.  Does she have to comes back to see you again or can she be set up to have ablation with [facility]?

## 2020-06-29 NOTE — Telephone Encounter (Signed)
Made patient aware that referral had already been placed and that [facility]'s office would contact him to schedule.

## 2020-06-29 NOTE — Telephone Encounter (Signed)
Because he had temporary relief I think he can just see Dr. Laurian Brim to discuss ablation.  We already did the referral.

## 2020-07-06 NOTE — Addendum Note (Signed)
Addended by: Monica Becton on: 07/06/2020 11:39 AM   Modules accepted: Orders

## 2020-07-06 NOTE — Telephone Encounter (Signed)
New referral placed.

## 2020-07-06 NOTE — Telephone Encounter (Signed)
Patient called to report that Dr. Weldon Inches is not in network with his insurance.  Per his insurance, he has to go to one of the following:   [facility]  [phone]  [facility]  [phone]  Please place new referral to one of these.

## 2020-07-07 NOTE — Telephone Encounter (Signed)
Spoke with patient regarding new referral to [facility].  He has the number and will call them himself to try to expedite the appointment.  If he cannot get an appt for some time, he will call us back as he is experiencing significant pain.

## 2020-07-07 NOTE — Telephone Encounter (Signed)
Patient returned call to reports that [facility] is scheduling new patients into February with 60 people ahead of him.  He stated that this was unacceptable.  He wants a referral sent to [facility] to see if he can get in there sooner.

## 2020-07-07 NOTE — Addendum Note (Signed)
Addended by: Monica Becton on: 07/07/2020 01:23 PM   Modules accepted: Orders

## 2020-07-07 NOTE — Telephone Encounter (Signed)
Done

## 2020-07-10 NOTE — Telephone Encounter (Signed)
[facility] is getting him in for an evaluation on Dec 7th.  He reports that he is having a hard time getting up out of a chair and the pain med that Dr. Ashley Royalty gave him is not touching it.  He requests something different.

## 2020-07-11 MED ORDER — TRAMADOL HCL 50 MG PO TABS: 50.0000 mg | ORAL_TABLET | Freq: Three times a day (TID) | ORAL | 0 refills | Status: DC | PRN

## 2020-07-11 NOTE — Addendum Note (Signed)
Addended by: Monica Becton on: 07/11/2020 02:36 PM   Modules accepted: Orders

## 2020-07-11 NOTE — Telephone Encounter (Signed)
Short course of tramadol sent in.

## 2020-07-11 NOTE — Telephone Encounter (Signed)
Patient aware of the new prescription that was sent in.

## 2020-07-21 ENCOUNTER — Other Ambulatory Visit: Payer: Self-pay | Admitting: Sports Medicine

## 2020-07-21 DIAGNOSIS — M961 Postlaminectomy syndrome, not elsewhere classified: Secondary | ICD-10-CM

## 2020-07-26 ENCOUNTER — Ambulatory Visit (INDEPENDENT_AMBULATORY_CARE_PROVIDER_SITE_OTHER): Payer: No Typology Code available for payment source

## 2020-07-26 ENCOUNTER — Other Ambulatory Visit: Payer: Self-pay

## 2020-07-26 ENCOUNTER — Other Ambulatory Visit: Payer: Self-pay | Admitting: Sports Medicine

## 2020-07-26 ENCOUNTER — Ambulatory Visit (INDEPENDENT_AMBULATORY_CARE_PROVIDER_SITE_OTHER): Payer: No Typology Code available for payment source | Admitting: Sports Medicine

## 2020-07-26 DIAGNOSIS — M961 Postlaminectomy syndrome, not elsewhere classified: Secondary | ICD-10-CM

## 2020-07-26 DIAGNOSIS — M533 Sacrococcygeal disorders, not elsewhere classified: Secondary | ICD-10-CM | POA: Diagnosis not present

## 2020-07-26 DIAGNOSIS — M5136 Other intervertebral disc degeneration, lumbar region: Secondary | ICD-10-CM | POA: Diagnosis not present

## 2020-07-26 MED ORDER — TRAMADOL HCL 50 MG PO TABS: 50.0000 mg | ORAL_TABLET | Freq: Three times a day (TID) | ORAL | 3 refills | Status: DC | PRN

## 2020-07-26 MED ORDER — TIZANIDINE HCL 4 MG PO TABS: 4.0000 mg | ORAL_TABLET | Freq: Three times a day (TID) | ORAL | 11 refills | Status: DC | PRN

## 2020-07-26 MED ORDER — TRIAZOLAM 0.25 MG PO TABS: ORAL_TABLET | ORAL | 0 refills | Status: DC

## 2020-07-26 NOTE — Progress Notes (Signed)
    Procedures performed today:    None.  Independent interpretation of notes and tests performed by another provider:   None.  Brief History, Exam, Impression, and Recommendations:    Failed back syndrome of lumbar spine Aayden returns, he has failed back surgery syndrome with multiple interventions into the lumbar spine including a spinal cord stimulator, which has provided some relief. He did have left and right sacroiliac joint pain, we did bilateral sacroiliac joint injections at the last visit, he responded well for about 24 hours. Ultimately we planned SI joint blocks and ablation, he does have an appointment set up with [facility] in about a week. In the meantime we will increase his tramadol to 3 times daily, add tizanidine 3 times daily per his request, and proceed with x-rays of his lumbar spine and SI joints. For interventional planning we are also going to proceed with CT myelography of his lumbar spine as well as CT scan of his SI joints for surgical planning. Triazolam for preprocedural anxiolysis. (No MRI due to spinal cord stimulator)  Of note there is significant generalized anxiety which certainly contributes to back pain, he will work with his PCP on this.    ___________________________________________ Ihor Austin. Benjamin Stain, M.D., ABFM., CAQSM. Primary Care and Sports Medicine  [facility]  Adjunct Instructor of Family Medicine  [employer]

## 2020-07-26 NOTE — Assessment & Plan Note (Addendum)
Brandon [name] returns, he has failed back surgery syndrome with multiple interventions into the lumbar spine including a spinal cord stimulator, which has provided some relief. He did have left and right sacroiliac joint pain, we did bilateral sacroiliac joint injections at the last visit, he responded well for about 24 hours. Ultimately we planned SI joint blocks and ablation, he does have an appointment set up with [facility] in about a week. In the meantime we will increase his tramadol to 3 times daily, add tizanidine 3 times daily per his request, and proceed with x-rays of his lumbar spine and SI joints. For interventional planning we are also going to proceed with CT myelography of his lumbar spine as well as CT scan of his SI joints for surgical planning. Triazolam for preprocedural anxiolysis. (No MRI due to spinal cord stimulator)  Of note there is significant generalized anxiety which certainly contributes to back pain, he will work with his PCP on this.

## 2020-07-27 ENCOUNTER — Telehealth: Payer: Self-pay

## 2020-07-27 NOTE — Telephone Encounter (Signed)
Phone call to patient to verify medication list and allergies for myelogram procedure. Pt instructed to hold Abilify, Wellbutrin, Reglan, and Tramadol for 48hrs prior to myelogram appointment time and 24 hours after appointment. Pt also instructed that his ordering doctor prescribed him Halcion to take 1 hour before the myelogram procedure. We will not give the patient additional valium when he arrives because of this. Pt aware. Pt also instructed to have a driver the day of the procedure, the procedure would take around 2 hours, and discharge instructions discussed. Pt verbalized understanding.

## 2020-08-02 ENCOUNTER — Other Ambulatory Visit: Payer: No Typology Code available for payment source

## 2020-08-02 ENCOUNTER — Inpatient Hospital Stay: Admission: RE | Admit: 2020-08-02 | Payer: No Typology Code available for payment source | Source: Ambulatory Visit

## 2020-08-10 ENCOUNTER — Telehealth: Payer: Self-pay

## 2020-08-10 NOTE — Telephone Encounter (Signed)
Brandon [name] called stating he has chest pain and shortness of breath with activity and/or stress. He is currently having chest pain. I advised to go to the ED for evaluation. Also advised to schedule a follow up hospital visit after he is discharged.

## 2020-08-10 NOTE — Telephone Encounter (Signed)
Noted and agree.   Needs to go ASAP.

## 2020-08-11 ENCOUNTER — Encounter (HOSPITAL_BASED_OUTPATIENT_CLINIC_OR_DEPARTMENT_OTHER): Payer: Self-pay

## 2020-08-11 ENCOUNTER — Emergency Department (HOSPITAL_BASED_OUTPATIENT_CLINIC_OR_DEPARTMENT_OTHER): Payer: No Typology Code available for payment source

## 2020-08-11 ENCOUNTER — Other Ambulatory Visit: Payer: Self-pay

## 2020-08-11 ENCOUNTER — Inpatient Hospital Stay (HOSPITAL_BASED_OUTPATIENT_CLINIC_OR_DEPARTMENT_OTHER)
Admission: EM | Admit: 2020-08-11 | Discharge: 2020-08-16 | DRG: 247 | Disposition: A | Discharge status: Home / Self Care | Payer: No Typology Code available for payment source | Attending: Family Medicine | Admitting: Family Medicine

## 2020-08-11 DIAGNOSIS — R0789 Other chest pain: Secondary | ICD-10-CM | POA: Diagnosis not present

## 2020-08-11 DIAGNOSIS — E1165 Type 2 diabetes mellitus with hyperglycemia: Secondary | ICD-10-CM | POA: Diagnosis present

## 2020-08-11 DIAGNOSIS — Z7982 Long term (current) use of aspirin: Secondary | ICD-10-CM

## 2020-08-11 DIAGNOSIS — Z955 Presence of coronary angioplasty implant and graft: Secondary | ICD-10-CM

## 2020-08-11 DIAGNOSIS — K3184 Gastroparesis: Secondary | ICD-10-CM | POA: Diagnosis present

## 2020-08-11 DIAGNOSIS — F1120 Opioid dependence, uncomplicated: Secondary | ICD-10-CM | POA: Diagnosis present

## 2020-08-11 DIAGNOSIS — M549 Dorsalgia, unspecified: Secondary | ICD-10-CM | POA: Diagnosis present

## 2020-08-11 DIAGNOSIS — Z85828 Personal history of other malignant neoplasm of skin: Secondary | ICD-10-CM

## 2020-08-11 DIAGNOSIS — Z87891 Personal history of nicotine dependence: Secondary | ICD-10-CM

## 2020-08-11 DIAGNOSIS — I2510 Atherosclerotic heart disease of native coronary artery without angina pectoris: Secondary | ICD-10-CM

## 2020-08-11 DIAGNOSIS — Z818 Family history of other mental and behavioral disorders: Secondary | ICD-10-CM

## 2020-08-11 DIAGNOSIS — F419 Anxiety disorder, unspecified: Secondary | ICD-10-CM | POA: Diagnosis present

## 2020-08-11 DIAGNOSIS — Z91018 Allergy to other foods: Secondary | ICD-10-CM

## 2020-08-11 DIAGNOSIS — Z79899 Other long term (current) drug therapy: Secondary | ICD-10-CM

## 2020-08-11 DIAGNOSIS — R079 Chest pain, unspecified: Secondary | ICD-10-CM | POA: Diagnosis present

## 2020-08-11 DIAGNOSIS — Z8249 Family history of ischemic heart disease and other diseases of the circulatory system: Secondary | ICD-10-CM

## 2020-08-11 DIAGNOSIS — E782 Mixed hyperlipidemia: Secondary | ICD-10-CM

## 2020-08-11 DIAGNOSIS — G8929 Other chronic pain: Secondary | ICD-10-CM | POA: Diagnosis present

## 2020-08-11 DIAGNOSIS — Z981 Arthrodesis status: Secondary | ICD-10-CM

## 2020-08-11 DIAGNOSIS — E1143 Type 2 diabetes mellitus with diabetic autonomic (poly)neuropathy: Secondary | ICD-10-CM | POA: Diagnosis present

## 2020-08-11 DIAGNOSIS — Z881 Allergy status to other antibiotic agents status: Secondary | ICD-10-CM

## 2020-08-11 DIAGNOSIS — Z20822 Contact with and (suspected) exposure to covid-19: Secondary | ICD-10-CM | POA: Diagnosis present

## 2020-08-11 DIAGNOSIS — F32A Depression, unspecified: Secondary | ICD-10-CM | POA: Diagnosis present

## 2020-08-11 DIAGNOSIS — I25110 Atherosclerotic heart disease of native coronary artery with unstable angina pectoris: Secondary | ICD-10-CM | POA: Diagnosis not present

## 2020-08-11 DIAGNOSIS — Z880 Allergy status to penicillin: Secondary | ICD-10-CM

## 2020-08-11 DIAGNOSIS — Z91040 Latex allergy status: Secondary | ICD-10-CM

## 2020-08-11 DIAGNOSIS — I10 Essential (primary) hypertension: Secondary | ICD-10-CM | POA: Diagnosis present

## 2020-08-11 DIAGNOSIS — E785 Hyperlipidemia, unspecified: Secondary | ICD-10-CM | POA: Diagnosis present

## 2020-08-11 LAB — CBC
HCT: 47.6 % (ref 39.0–52.0)
Hemoglobin: 15.8 g/dL (ref 13.0–17.0)
MCH: 28.6 pg (ref 26.0–34.0)
MCHC: 33.2 g/dL (ref 30.0–36.0)
MCV: 86.2 fL (ref 80.0–100.0)
Platelets: 232 10*3/uL (ref 150–400)
RBC: 5.52 MIL/uL (ref 4.22–5.81)
RDW: 16.6 % — ABNORMAL HIGH (ref 11.5–15.5)
WBC: 6.2 10*3/uL (ref 4.0–10.5)
nRBC: 0 % (ref 0.0–0.2)

## 2020-08-11 LAB — BASIC METABOLIC PANEL
Anion gap: 13 (ref 5–15)
BUN: 7 mg/dL — ABNORMAL LOW (ref 8–23)
CO2: 22 mmol/L (ref 22–32)
Calcium: 8.9 mg/dL (ref 8.9–10.3)
Chloride: 101 mmol/L (ref 98–111)
Creatinine, Ser: 0.86 mg/dL (ref 0.61–1.24)
GFR, Estimated: >60 mL/min (ref >60)
Glucose, Bld: 217 mg/dL — ABNORMAL HIGH (ref 70–99)
Potassium: 3.2 mmol/L — ABNORMAL LOW (ref 3.5–5.1)
Sodium: 136 mmol/L (ref 135–145)

## 2020-08-11 LAB — CBG MONITORING, ED: Glucose-Capillary: 98 mg/dL (ref 70–99)

## 2020-08-11 LAB — TROPONIN I (HIGH SENSITIVITY)
Troponin I (High Sensitivity): 4 ng/L (ref <18)
Troponin I (High Sensitivity): 5 ng/L (ref <18)

## 2020-08-11 LAB — RESP PANEL BY RT-PCR (FLU A&B, COVID) ARPGX2
Influenza A by PCR: NEGATIVE
Influenza B by PCR: NEGATIVE
SARS Coronavirus 2 by RT PCR: NEGATIVE

## 2020-08-11 LAB — D-DIMER, QUANTITATIVE: D-Dimer, Quant: 0.31 ug/mL-FEU (ref 0.00–0.50)

## 2020-08-11 MED ORDER — NITROGLYCERIN 0.4 MG SL SUBL
0.4000 mg | SUBLINGUAL_TABLET | SUBLINGUAL | Status: DC | PRN
  Administered 2020-08-11 – 2020-08-15 (×4): 0.4 mg via SUBLINGUAL
  Filled 2020-08-11 (×3): qty 1

## 2020-08-11 MED ORDER — FAMOTIDINE 20 MG PO TABS
20.0000 mg | ORAL_TABLET | Freq: Once | ORAL | Status: AC
  Administered 2020-08-11: 20 mg via ORAL
  Filled 2020-08-11: qty 1

## 2020-08-11 MED ORDER — MORPHINE SULFATE (PF) 4 MG/ML IV SOLN
4.0000 mg | Freq: Once | INTRAVENOUS | Status: AC
  Administered 2020-08-11: 4 mg via INTRAVENOUS
  Filled 2020-08-11: qty 1

## 2020-08-11 MED ORDER — HYDROMORPHONE HCL 1 MG/ML IJ SOLN
1.0000 mg | Freq: Once | INTRAMUSCULAR | Status: AC
  Administered 2020-08-11: 1 mg via INTRAVENOUS
  Filled 2020-08-11: qty 1

## 2020-08-11 MED ORDER — MORPHINE SULFATE (PF) 4 MG/ML IV SOLN: 4.0000 mg | Freq: Once | INTRAVENOUS | Status: DC

## 2020-08-11 MED ORDER — LIDOCAINE VISCOUS HCL 2 % MT SOLN
15.0000 mL | Freq: Once | OROMUCOSAL | Status: AC
  Administered 2020-08-11: 15 mL via ORAL
  Filled 2020-08-11: qty 15

## 2020-08-11 MED ORDER — ASPIRIN 81 MG PO CHEW
162.0000 mg | CHEWABLE_TABLET | Freq: Once | ORAL | Status: AC
  Administered 2020-08-11: 162 mg via ORAL
  Filled 2020-08-11 (×2): qty 2

## 2020-08-11 MED ORDER — IOHEXOL 350 MG/ML SOLN
100.0000 mL | Freq: Once | INTRAVENOUS | Status: AC
  Administered 2020-08-11: 100 mL via INTRAVENOUS

## 2020-08-11 MED ORDER — ALUM & MAG HYDROXIDE-SIMETH 200-200-20 MG/5ML PO SUSP
30.0000 mL | Freq: Once | ORAL | Status: AC
  Administered 2020-08-11: 30 mL via ORAL
  Filled 2020-08-11: qty 30

## 2020-08-11 MED FILL — TRESIBA FLEXTOUCH 100 UNITS: 100 | 90 days supply | Qty: 54 | Fill #0

## 2020-08-11 MED FILL — AMLODIPINE BESYLATE 10 MG T: 10 | 90 days supply | Qty: 90 | Fill #0

## 2020-08-11 MED FILL — TAMSULOSIN HCL 0.4 MG CAP: 0.4 | 90 days supply | Qty: 90 | Fill #0

## 2020-08-11 MED FILL — buPROPion HCL ER (XL) 150 M: 150 | 90 days supply | Qty: 90 | Fill #0

## 2020-08-11 MED FILL — HUMALOG 100 UNITS/ML KWIKPE: 100 | 87 days supply | Qty: 39 | Fill #0

## 2020-08-11 MED FILL — buPROPion HCL ER (XL) 300 M: 300 | 90 days supply | Qty: 90 | Fill #0

## 2020-08-11 MED FILL — PANTOPRAZOLE SOD DR 40 MG T: 40 | 90 days supply | Qty: 90 | Fill #0

## 2020-08-11 NOTE — ED Provider Notes (Signed)
[facility] EMERGENCY DEPARTMENT Provider Note   CSN: [id number] Arrival date & time: 08/11/20  1358     History Chief Complaint  Patient presents with  . Chest Pain    Brandon [name] is a 64 y.o. male.  HPI   Patient with significant medical history of anxiety, depression, diabetes type 2, hypertension presents to the emergency department with chief complaint of chest pain and shortness of breath.  Patient states chest pain started about 2 days ago started suddenly while he was walking his dog, describes the pain as a crushing sensation along his chest, is worse with exertion and becomes short of breath, he denies radiating pain, become diaphoretic, nausea, vomiting, lightheadedness or dizziness.  He denies recreational drug use, denies alcohol use, did have a history of smoking stopped about 10 years ago, has no cardiac history, no DVT or PE history,  is on no hormone therapy.  He states the pain has been persistent, and gets worse when he moves or exerts himself, he states the pain stays consistent even while laying down.  He states he is never had this happen to the him in the past, he denies any alleviating factors.  Patient denies headaches, fevers, chills, sore throat, cough, abdominal pain, nausea, vomiting, diarrhea, pedal edema.  Past Medical History:  Diagnosis Date  . Anxiety   . Basal cell carcinoma (BCC) in situ of skin   . Colon polyps   . Depression   . Diabetes mellitus without complication (HCC)   . Hypertension   . SBO (small bowel obstruction) [facility])     Patient Active Problem List   Diagnosis Date Noted  . Chest pain 08/11/2020  . Failed back syndrome of lumbar spine 06/22/2020  . Major depression in partial remission (HCC) 06/04/2020  . Nausea and vomiting 04/14/2020  . Hyperlipidemia associated with type 2 diabetes mellitus (HCC) 04/14/2020  . Hypertension associated with diabetes (HCC) 04/07/2020  . Type 2 diabetes mellitus with other specified  complication (HCC) 04/07/2020  . SBO (small bowel obstruction) (HCC) 04/06/2020  . Sepsis (HCC) 10/06/2019  . Status post insertion of spinal cord stimulator 05/19/2017  . 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 Erectile dysfunction 10/09/2015    Past Surgical History:  Procedure Laterality Date  . BACK SURGERY    . CATARACT EXTRACTION W/ INTRAOCULAR LENS  IMPLANT, BILATERAL    . CHOLECYSTECTOMY    . CHOLECYSTECTOMY, LAPAROSCOPIC    . EYE SURGERY    . LUMBAR FUSION     L3-5 with rods  . XI ROBOTIC ASSISTED INGUINAL HERNIA REPAIR WITH MESH     x 3 surgeries       Family History  Problem Relation Age of Onset  . COPD Mother   . Anxiety disorder Mother   . Depression Mother   . Cancer Father   . Anxiety disorder Father   . Depression Father   . Other Father     Social History   Tobacco Use  . Smoking status: Former Games developer  . Smokeless tobacco: Never Used  Substance Use Topics  . Alcohol use: Not Currently  . Drug use: Not Currently    Home Medications Prior to Admission medications   Medication Sig Start Date End Date Taking? Authorizing Provider  amLODipine (NORVASC) 10 MG tablet Take 10 mg by mouth daily.    [provider]  ARIPiprazole (ABILIFY) 10 MG tablet Take 10 mg by mouth daily.    [provider]  aspirin 81 MG chewable  tablet Chew 81 mg by mouth daily.    [provider]  buPROPion (WELLBUTRIN SR) 150 MG 12 hr tablet Take 150 mg by mouth 2 (two) times daily.    [provider]  buPROPion (WELLBUTRIN XL) 300 MG 24 hr tablet Take by mouth.    [provider]  empagliflozin (JARDIANCE) 25 MG TABS tablet Take 25 mg by mouth daily.    [provider]  Eszopiclone (ESZOPICLONE) 3 MG TABS Take 3 mg by mouth at bedtime. LUNESTA.    [provider]  ibuprofen (ADVIL) 800 MG tablet Take 800 mg by mouth every 8 (eight) hours as needed.    [provider]  insulin degludec (TRESIBA FLEXTOUCH) 100  UNIT/ML FlexTouch Pen Inject 60 Units into the skin daily. 06/08/20 09/06/20  Everrett Coombe, DO  lovastatin (ALTOPREV) 40 MG 24 hr tablet Take 40 mg by mouth at bedtime.    [provider]  metoCLOPramide (REGLAN) 10 MG tablet Take 1 tablet (10 mg total) by mouth 3 (three) times daily with meals for 7 days. 04/17/20 06/02/20  Briant Cedar, MD  metoCLOPramide (REGLAN) 10 MG tablet Take 10 mg by mouth every 8 (eight) hours as needed for nausea. PRN    [provider]  Multiple Vitamins-Minerals (OCUVITE PO) Take by mouth. Patient not taking: Reported on 07/27/2020    [provider]  ondansetron (ZOFRAN ODT) 4 MG disintegrating tablet Take 1 tablet (4 mg total) by mouth every 8 (eight) hours as needed for nausea or vomiting. 04/17/20   Briant Cedar, MD  pantoprazole (PROTONIX) 40 MG tablet Take 40 mg by mouth daily.    [provider]  pioglitazone (ACTOS) 30 MG tablet Take 30 mg by mouth daily.    [provider]  SM Multiple Vitamins Essential TABS Take 1 tablet by mouth daily. Patient not taking: Reported on 07/27/2020    [provider]  tamsulosin (FLOMAX) 0.4 MG CAPS capsule Take 0.4 mg by mouth.    [provider]  Testosterone Cypionate 200 MG/ML SOLN Inject 200 mg as directed once a week.    [provider]  tiZANidine (ZANAFLEX) 4 MG tablet Take 1 tablet (4 mg total) by mouth every 8 (eight) hours as needed for muscle spasms. 07/26/20   Monica Becton, MD  traMADol (ULTRAM) 50 MG tablet Take 1 tablet (50 mg total) by mouth 3 (three) times daily as needed. Patient taking differently: Take 50 mg by mouth 3 (three) times daily as needed. 50-100 mg 07/26/20   Monica Becton, MD  triazolam (HALCION) 0.25 MG tablet 1-2 tabs PO 2 hours before procedure or imaging.  Do not drive with this medication. 07/26/20   Monica Becton, MD    Allergies    Kiwi extract, Other, Penicillins, Ancef  [cefazolin], Latex, and Levaquin [levofloxacin]  Review of Systems   Review of Systems  Constitutional: Negative for chills and fever.  HENT: Negative for congestion and sore throat.   Eyes: Negative for visual disturbance.  Respiratory: Positive for shortness of breath. Negative for cough.   Cardiovascular: Positive for chest pain.  Gastrointestinal: Negative for abdominal pain, diarrhea, nausea and vomiting.  Genitourinary: Negative for enuresis and flank pain.  Musculoskeletal: Negative for back pain.  Skin: Negative for rash.  Neurological: Negative for dizziness and headaches.  Hematological: Does not bruise/bleed easily.    Physical Exam Updated Vital Signs BP 132/74 (BP Location: Right Arm)   Pulse 82   Temp 98.2  F (36.8 C) (Oral)   Resp 13   Ht 5\' 9"  (1.753 m)   Wt 98.4 kg   SpO2 94%   BMI 32.05 kg/m   Physical Exam Vitals and nursing note reviewed.  Constitutional:      General: He is not in acute distress.    Appearance: He is not ill-appearing.  HENT:     Head: Normocephalic and atraumatic.     Nose: No congestion.  Eyes:     Conjunctiva/sclera: Conjunctivae normal.  Cardiovascular:     Rate and Rhythm: Regular rhythm. Tachycardia present.     Pulses: Normal pulses.     Heart sounds: No murmur heard. No friction rub. No gallop.   Pulmonary:     Effort: No respiratory distress.     Breath sounds: No wheezing, rhonchi or rales.  Abdominal:     Palpations: Abdomen is soft.     Tenderness: There is no abdominal tenderness.  Musculoskeletal:     Right lower leg: No edema.     Left lower leg: No edema.     Comments: Moving all 4 extremities without difficulty.  Skin:    General: Skin is warm and dry.  Neurological:     Mental Status: He is alert.     Comments: Having no difficulty with word finding.  Psychiatric:        Mood and Affect: Mood normal.     ED Results / Procedures / Treatments   Labs (all labs ordered are listed, but only abnormal  results are displayed) Labs Reviewed  BASIC METABOLIC PANEL - Abnormal; Notable for the following components:      Result Value   Potassium 3.2 (*)    Glucose, Bld 217 (*)    BUN 7 (*)    All other components within normal limits  CBC - Abnormal; Notable for the following components:   RDW 16.6 (*)    All other components within normal limits  RESP PANEL BY RT-PCR (FLU A&B, COVID) [id number]  D-DIMER, QUANTITATIVE (NOT AT [facility])  LIPASE, BLOOD  HEPATIC FUNCTION PANEL  CBG MONITORING, ED  TROPONIN I (HIGH SENSITIVITY)  TROPONIN I (HIGH SENSITIVITY)    EKG EKG Interpretation  Date/Time:  Friday August 11 2020 14:03:52 EST Ventricular Rate:  119 PR Interval:  154 QRS Duration: 100 QT Interval:  326 QTC Calculation: 458 R Axis:   35 Text Interpretation: Sinus tachycardia with Premature atrial complexes with Abberant conduction Otherwise normal ECG No STEMI Confirmed by Alvester Chourifan, Matthew [phone]([id number]) on 08/11/2020 3:02:54 PM   Radiology DG Chest 2 View  Result Date: 08/11/2020 CLINICAL DATA:  Chest pain EXAM: CHEST - 2 VIEW COMPARISON:  04/08/2020 FINDINGS: Cardiac and mediastinal contours normal. Pulmonary vascularity normal. Mild scarring in the left lung base unchanged. No acute infiltrate or effusion. Spinal cord stimulator unchanged. IMPRESSION: No active cardiopulmonary disease. Electronically Signed   By: Marlan Palauharles  Clark M.D.   On: 08/11/2020 14:51   CT Angio Chest/Abd/Pel for Dissection W and/or Wo Contrast  Result Date: 08/11/2020 CLINICAL DATA:  Abdominal pain, mid sternal chest pain with exertion for 3 days. Known today pain is constant. Hypertensive. Of aortic dissection suspected. EXAM: CT ANGIOGRAPHY CHEST, ABDOMEN AND PELVIS TECHNIQUE: Non-contrast CT of the chest was initially obtained. Multidetector CT imaging through the chest, abdomen and pelvis was performed using the standard protocol during bolus administration of intravenous contrast. Multiplanar reconstructed images  and MIPs were obtained and reviewed to evaluate the vascular anatomy. CONTRAST:  100mL OMNIPAQUE IOHEXOL 350  MG/ML SOLN COMPARISON:  CT abdomen pelvis 04/14/2020. FINDINGS: VASCULAR Aorta: Mild atherosclerotic plaque. Normal caliber aorta without aneurysm, dissection, vasculitis or significant stenosis. Celiac: Patent without evidence of aneurysm, dissection, vasculitis or significant stenosis. SMA: Patent without evidence of aneurysm, dissection, vasculitis or significant stenosis. Renals: Both renal arteries are patent without evidence of aneurysm, dissection, vasculitis, fibromuscular dysplasia or significant stenosis. IMA: Patent without evidence of aneurysm, dissection, vasculitis or significant stenosis. Inflow: Mild atherosclerotic plaque. Patent without evidence of aneurysm, dissection, vasculitis or significant stenosis. Veins: No obvious venous abnormality within the limitations of this arterial phase study. Review of the MIP images confirms the above findings. NON-VASCULAR Cardiovascular: Preferential opacification of the thoracic aorta. Normal heart size. No pericardial effusion. Mild left anterior descending coronary artery calcifications. Mediastinum/Nodes: Pre-vascular lymph node calcification. No enlarged mediastinal, hilar, or axillary lymph nodes. Thyroid gland, trachea, and esophagus demonstrate no significant findings. The main pulmonary artery is normal in caliber. No central pulmonary embolus. Lungs/Pleura: Left posterior pleura calcification. Calcified granuloma at the left base. No pulmonary mass. No focal consolidation. Left lower lobe scarring. No pleural effusion. No pneumothorax. Musculoskeletal: No chest wall abnormality. No suspicious lytic or blastic osseous lesions. No acute displaced fracture. Multilevel degenerative changes of the spine. Lower back neurostimulator with electrodes terminating in the posterior spinal canal. Hepatobiliary: No focal liver abnormality is seen. Status  post cholecystectomy. No biliary dilatation. Pancreas: For No focal lesion. Normal pancreatic contour. No surrounding inflammatory changes. No main pancreatic ductal dilatation. Spleen: Couple of punctate calcifications within the splenic parenchyma likely represent sequelae of prior granulomatous disease. normal in size without focal abnormality. Adrenals/Urinary Tract: No adrenal nodule bilaterally. Bilateral kidneys enhance symmetrically. Similar-appearing 2.7 cm fluid density lesion within the right kidney that likely represents a simple renal cyst. No hydronephrosis. No hydroureter. The urinary bladder is distended with urine and grossly unremarkable. Stomach/Bowel: Stomach is within normal limits. No evidence of bowel wall thickening or dilatation. Scattered colonic diverticulosis appendix appears normal. Lymphatic: No abdominal or pelvic lymphadenopathy. Reproductive: Penile prosthesis is noted with a reservoir within the left anterior lower abdomen. The prostate is enlarged measuring up to 5.6 cm. Other: No intraperitoneal free fluid. No intraperitoneal free gas. No organized fluid collection. Musculoskeletal: No abdominal wall hernia or abnormality. L4-L5 laminectomy and posterolateral fusion. No suspicious lytic or blastic osseous lesions. No acute displaced fracture. Multilevel degenerative changes of the spine. Review of the MIP images confirms the above findings. IMPRESSION: 1. No aortic aneurysm or dissection. Aortic Atherosclerosis (ICD10-I70.0) - mild. 2. No acute intrathoracic, intra-abdominal, or intrapelvic abnormality in a patient with prior granulomatous disease. 3. Prostatomegaly. 4. Scattered colonic diverticulosis with no acute diverticulitis. Electronically Signed   By: Tish Frederickson M.D.   On: 08/11/2020 21:40    Procedures Procedures (including critical care time)  Medications Ordered in ED Medications  nitroGLYCERIN (NITROSTAT) SL tablet 0.4 mg (0.4 mg Sublingual Given 08/11/20  1720)  aspirin chewable tablet 162 mg (162 mg Oral Given 08/11/20 1710)  alum & mag hydroxide-simeth (MAALOX/MYLANTA) 200-200-20 MG/5ML suspension 30 mL (30 mLs Oral Given 08/11/20 1729)    And  lidocaine (XYLOCAINE) 2 % viscous mouth solution 15 mL (15 mLs Oral Given 08/11/20 1729)  famotidine (PEPCID) tablet 20 mg (20 mg Oral Given 08/11/20 1813)  morphine 4 MG/ML injection 4 mg (4 mg Intravenous Given 08/11/20 1818)  iohexol (OMNIPAQUE) 350 MG/ML injection 100 mL (100 mLs Intravenous Contrast Given 08/11/20 2058)  HYDROmorphone (DILAUDID) injection 1 mg (1 mg Intravenous Given 08/11/20 2234)  ED Course  I have reviewed the triage vital signs and the nursing notes.  Pertinent labs & imaging results that were available during my care of the patient were reviewed by me and considered in my medical decision making (see chart for details).    MDM Rules/Calculators/A&P                          Patient presents with chest pain x2 days  With shortness of breath.  He is alert, does not appear in acute distress, but signs and for tachycardia.  Will obtain chest pain work-up and provide him with aspirin and nitro and reevaluate.  Patient is reevaluated after providing with aspirin nitro, states pain is still an 8 out of 10 and is not changed from prior.  Still has a crushing/pressure sensation middle of his chest.  Will provide patient with GI cocktail and Pepcid and reevaluate.  Patient was reassessed continues substernal chest pain, states his pain has been unchanged.  Due to concerning story, with elevated risk factors, heart score of 5 will consult with cardiology for further recommendations.  Spoke with Dr. Stefano Gaul of cardiology and does not feel patient needs to be admitted by cardiology for further work-up.  He does recommend ordering CT angio of aorta to exclude possibility of dissection, if negative and pain is still not controlled he would recommend hospital admission for further work-up.   He be more than happy to consult on the patient.  Dr.opyd agrees patient should be admitted for further pain control.  He asked to add on a hepatic panel as well as lipase for further evaluation.  CBC negative for leukocytosis or signs of anemia.  BMP shows slight hypokalemia 3.2, no metabolic acidosis, hyperglycemia 217, no AKI, no anion gap present.  Negative D-dimer, initial troponin was 4, second opponent was 5.  Chest x-ray does not reveal any acute findings.  EKG shows sinus tach without signs of ischemia no ST elevation or depression noted.  CT angio chest, abdomen, pelvis shows no aortic aneurysm or dissection, no acute anterior thoracic, abdominal, pelvic abnormality noted.  I have low suspicion for systemic infection as patient is nontoxic-appearing, vital signs reassuring, no obvious source infection on my exam.  I suspect isolated tachycardia is secondary to anxiety as well as pain.  Tachycardia did improve with pain management and fluids.  Low suspicion for intra-abdominal abnormality requiring any intervention as abdomen soft nontender to palpation, no acute findings seen on imaging.  Low suspicion for stomach ulcer as patient denies NSAID use, denies smoking, and there is no epigastric tenderness on my exam. low suspicion for PE as patient denies pleuritic chest pain, had a negative D-dimer.  Low suspicion for dissection as CT imaging was negative.  Patient has chest pain with unknown etiology anticipate patient will need further observation and pain management inpatient.  Patient care to be transferred to a admitting team for further observation.   Final Clinical Impression(s) / ED Diagnoses Final diagnoses:  Chest wall pain    Rx / DC Orders ED Discharge Orders    None       Carroll Sage, PA-C 08/11/20 2255    Terald Sleeper, MD 08/12/20 1119

## 2020-08-11 NOTE — ED Triage Notes (Signed)
Pt complains of midsternal chest pain with exertion x 3 days, now today is constant. Hypertensive during triage

## 2020-08-12 ENCOUNTER — Other Ambulatory Visit: Payer: Self-pay

## 2020-08-12 DIAGNOSIS — R0789 Other chest pain: Secondary | ICD-10-CM | POA: Diagnosis present

## 2020-08-12 DIAGNOSIS — Z91040 Latex allergy status: Secondary | ICD-10-CM | POA: Diagnosis not present

## 2020-08-12 DIAGNOSIS — Z79899 Other long term (current) drug therapy: Secondary | ICD-10-CM | POA: Diagnosis not present

## 2020-08-12 DIAGNOSIS — Z7982 Long term (current) use of aspirin: Secondary | ICD-10-CM | POA: Diagnosis not present

## 2020-08-12 DIAGNOSIS — Z20822 Contact with and (suspected) exposure to covid-19: Secondary | ICD-10-CM | POA: Diagnosis not present

## 2020-08-12 DIAGNOSIS — I25110 Atherosclerotic heart disease of native coronary artery with unstable angina pectoris: Secondary | ICD-10-CM | POA: Diagnosis not present

## 2020-08-12 DIAGNOSIS — Z880 Allergy status to penicillin: Secondary | ICD-10-CM | POA: Diagnosis not present

## 2020-08-12 DIAGNOSIS — F32A Depression, unspecified: Secondary | ICD-10-CM | POA: Diagnosis not present

## 2020-08-12 DIAGNOSIS — E785 Hyperlipidemia, unspecified: Secondary | ICD-10-CM | POA: Diagnosis not present

## 2020-08-12 DIAGNOSIS — F419 Anxiety disorder, unspecified: Secondary | ICD-10-CM | POA: Diagnosis not present

## 2020-08-12 DIAGNOSIS — F1120 Opioid dependence, uncomplicated: Secondary | ICD-10-CM | POA: Diagnosis not present

## 2020-08-12 DIAGNOSIS — Z87891 Personal history of nicotine dependence: Secondary | ICD-10-CM | POA: Diagnosis not present

## 2020-08-12 DIAGNOSIS — Z818 Family history of other mental and behavioral disorders: Secondary | ICD-10-CM | POA: Diagnosis not present

## 2020-08-12 DIAGNOSIS — R079 Chest pain, unspecified: Secondary | ICD-10-CM | POA: Diagnosis not present

## 2020-08-12 DIAGNOSIS — G8929 Other chronic pain: Secondary | ICD-10-CM | POA: Diagnosis not present

## 2020-08-12 DIAGNOSIS — Z91018 Allergy to other foods: Secondary | ICD-10-CM | POA: Diagnosis not present

## 2020-08-12 DIAGNOSIS — K3184 Gastroparesis: Secondary | ICD-10-CM | POA: Diagnosis not present

## 2020-08-12 DIAGNOSIS — Z85828 Personal history of other malignant neoplasm of skin: Secondary | ICD-10-CM | POA: Diagnosis not present

## 2020-08-12 DIAGNOSIS — E1143 Type 2 diabetes mellitus with diabetic autonomic (poly)neuropathy: Secondary | ICD-10-CM | POA: Diagnosis not present

## 2020-08-12 DIAGNOSIS — I10 Essential (primary) hypertension: Secondary | ICD-10-CM | POA: Diagnosis not present

## 2020-08-12 DIAGNOSIS — Z8249 Family history of ischemic heart disease and other diseases of the circulatory system: Secondary | ICD-10-CM | POA: Diagnosis not present

## 2020-08-12 DIAGNOSIS — E1165 Type 2 diabetes mellitus with hyperglycemia: Secondary | ICD-10-CM | POA: Diagnosis not present

## 2020-08-12 DIAGNOSIS — M549 Dorsalgia, unspecified: Secondary | ICD-10-CM | POA: Diagnosis not present

## 2020-08-12 DIAGNOSIS — Z881 Allergy status to other antibiotic agents status: Secondary | ICD-10-CM | POA: Diagnosis not present

## 2020-08-12 DIAGNOSIS — Z981 Arthrodesis status: Secondary | ICD-10-CM | POA: Diagnosis not present

## 2020-08-12 LAB — RAPID URINE DRUG SCREEN, HOSP PERFORMED
Amphetamines: NOT DETECTED
Barbiturates: NOT DETECTED
Benzodiazepines: NOT DETECTED
Cocaine: NOT DETECTED
Opiates: NOT DETECTED
Tetrahydrocannabinol: NOT DETECTED

## 2020-08-12 LAB — HEPATIC FUNCTION PANEL
ALT: 14 U/L (ref 0–44)
AST: 24 U/L (ref 15–41)
Albumin: 3.8 g/dL (ref 3.5–5.0)
Alkaline Phosphatase: 65 U/L (ref 38–126)
Bilirubin, Direct: 0.1 mg/dL (ref 0.0–0.2)
Indirect Bilirubin: 0.8 mg/dL (ref 0.3–0.9)
Total Bilirubin: 0.9 mg/dL (ref 0.3–1.2)
Total Protein: 7 g/dL (ref 6.5–8.1)

## 2020-08-12 LAB — GLUCOSE, CAPILLARY: Glucose-Capillary: 192 mg/dL — ABNORMAL HIGH (ref 70–99)

## 2020-08-12 LAB — LIPASE, BLOOD: Lipase: 38 U/L (ref 11–51)

## 2020-08-12 LAB — CBG MONITORING, ED: Glucose-Capillary: 184 mg/dL — ABNORMAL HIGH (ref 70–99)

## 2020-08-12 MED ORDER — METOCLOPRAMIDE HCL 5 MG PO TABS: 10.0000 mg | ORAL_TABLET | Freq: Three times a day (TID) | ORAL | Status: DC | PRN

## 2020-08-12 MED ORDER — LABETALOL HCL 200 MG PO TABS: 100.0000 mg | ORAL_TABLET | Freq: Four times a day (QID) | ORAL | Status: DC | PRN

## 2020-08-12 MED ORDER — BUPROPION HCL ER (XL) 150 MG PO TB24
450.0000 mg | ORAL_TABLET | Freq: Every day | ORAL | Status: DC
  Administered 2020-08-13 – 2020-08-16 (×4): 450 mg via ORAL
  Filled 2020-08-12 (×4): qty 3

## 2020-08-12 MED ORDER — BUPROPION HCL ER (SR) 150 MG PO TB12: 150.0000 mg | ORAL_TABLET | Freq: Two times a day (BID) | ORAL | Status: DC

## 2020-08-12 MED ORDER — AMLODIPINE BESYLATE 10 MG PO TABS
10.0000 mg | ORAL_TABLET | Freq: Every day | ORAL | Status: DC
  Administered 2020-08-12 – 2020-08-16 (×4): 10 mg via ORAL
  Filled 2020-08-12 (×4): qty 1

## 2020-08-12 MED ORDER — TAMSULOSIN HCL 0.4 MG PO CAPS
0.4000 mg | ORAL_CAPSULE | Freq: Every day | ORAL | Status: DC
  Administered 2020-08-12 – 2020-08-16 (×5): 0.4 mg via ORAL
  Filled 2020-08-12 (×5): qty 1

## 2020-08-12 MED ORDER — EMPAGLIFLOZIN 25 MG PO TABS
25.0000 mg | ORAL_TABLET | Freq: Every day | ORAL | Status: DC
Start: 2020-08-12 — End: 2020-08-16
  Administered 2020-08-12 – 2020-08-16 (×5): 25 mg via ORAL
  Filled 2020-08-12 (×5): qty 1

## 2020-08-12 MED ORDER — ASPIRIN 81 MG PO CHEW
81.0000 mg | CHEWABLE_TABLET | Freq: Every day | ORAL | Status: DC
  Administered 2020-08-12 – 2020-08-15 (×4): 81 mg via ORAL
  Filled 2020-08-12 (×4): qty 1

## 2020-08-12 MED ORDER — INSULIN GLARGINE 100 UNIT/ML ~~LOC~~ SOLN
60.0000 [IU] | Freq: Every day | SUBCUTANEOUS | Status: DC
  Administered 2020-08-12 – 2020-08-14 (×3): 60 [IU] via SUBCUTANEOUS
  Filled 2020-08-12 (×5): qty 0.6

## 2020-08-12 MED ORDER — LOVASTATIN ER 40 MG PO TB24: 40.0000 mg | ORAL_TABLET | Freq: Every day | ORAL | Status: DC

## 2020-08-12 MED ORDER — ACETAMINOPHEN 325 MG PO TABS: 650.0000 mg | ORAL_TABLET | ORAL | Status: DC | PRN

## 2020-08-12 MED ORDER — HYDROMORPHONE HCL 1 MG/ML IJ SOLN
1.0000 mg | Freq: Once | INTRAMUSCULAR | Status: AC
Start: 2020-08-12 — End: 2020-08-12
  Administered 2020-08-12: 1 mg via INTRAVENOUS
  Filled 2020-08-12: qty 1

## 2020-08-12 MED ORDER — LIDOCAINE 5 % EX PTCH
1.0000 | MEDICATED_PATCH | CUTANEOUS | Status: DC
  Administered 2020-08-12 – 2020-08-15 (×2): 1 via TRANSDERMAL
  Filled 2020-08-12 (×4): qty 1

## 2020-08-12 MED ORDER — PRAVASTATIN SODIUM 40 MG PO TABS
40.0000 mg | ORAL_TABLET | Freq: Every day | ORAL | Status: DC
  Administered 2020-08-12 – 2020-08-15 (×4): 40 mg via ORAL
  Filled 2020-08-12 (×4): qty 1

## 2020-08-12 MED ORDER — INSULIN DEGLUDEC 100 UNIT/ML ~~LOC~~ SOPN: 60.0000 [IU] | PEN_INJECTOR | Freq: Every day | SUBCUTANEOUS | Status: DC

## 2020-08-12 MED ORDER — ARIPIPRAZOLE 10 MG PO TABS
10.0000 mg | ORAL_TABLET | Freq: Every day | ORAL | Status: DC
  Administered 2020-08-12 – 2020-08-16 (×5): 10 mg via ORAL
  Filled 2020-08-12 (×4): qty 2
  Filled 2020-08-12: qty 1
  Filled 2020-08-12: qty 2

## 2020-08-12 MED ORDER — PIOGLITAZONE HCL 30 MG PO TABS
30.0000 mg | ORAL_TABLET | Freq: Every day | ORAL | Status: DC
  Administered 2020-08-12 – 2020-08-16 (×5): 30 mg via ORAL
  Filled 2020-08-12 (×5): qty 1

## 2020-08-12 MED ORDER — PANTOPRAZOLE SODIUM 40 MG PO TBEC
40.0000 mg | DELAYED_RELEASE_TABLET | Freq: Every day | ORAL | Status: DC
  Administered 2020-08-12 – 2020-08-16 (×5): 40 mg via ORAL
  Filled 2020-08-12 (×5): qty 1

## 2020-08-12 MED ORDER — IBUPROFEN 600 MG PO TABS: 800.0000 mg | ORAL_TABLET | Freq: Three times a day (TID) | ORAL | Status: DC | PRN

## 2020-08-12 MED ORDER — INSULIN ASPART 100 UNIT/ML ~~LOC~~ SOLN
0.0000 [IU] | Freq: Three times a day (TID) | SUBCUTANEOUS | Status: DC
  Administered 2020-08-13 – 2020-08-16 (×3): 2 [IU] via SUBCUTANEOUS
  Administered 2020-08-16: 3 [IU] via SUBCUTANEOUS

## 2020-08-12 MED ORDER — ENOXAPARIN SODIUM 40 MG/0.4ML ~~LOC~~ SOLN
40.0000 mg | SUBCUTANEOUS | Status: DC
  Administered 2020-08-12 – 2020-08-13 (×2): 40 mg via SUBCUTANEOUS
  Filled 2020-08-12 (×2): qty 0.4

## 2020-08-12 MED ORDER — TIZANIDINE HCL 4 MG PO TABS
4.0000 mg | ORAL_TABLET | Freq: Three times a day (TID) | ORAL | Status: DC | PRN
  Administered 2020-08-13 – 2020-08-14 (×3): 4 mg via ORAL
  Filled 2020-08-12 (×4): qty 1

## 2020-08-12 MED ORDER — TRAMADOL HCL 50 MG PO TABS
50.0000 mg | ORAL_TABLET | Freq: Three times a day (TID) | ORAL | Status: DC | PRN
  Administered 2020-08-12 – 2020-08-15 (×5): 50 mg via ORAL
  Filled 2020-08-12 (×5): qty 1

## 2020-08-12 MED ORDER — ONDANSETRON HCL 4 MG/2ML IJ SOLN
4.0000 mg | Freq: Four times a day (QID) | INTRAMUSCULAR | Status: DC | PRN
  Administered 2020-08-15: 4 mg via INTRAVENOUS
  Filled 2020-08-12: qty 2

## 2020-08-12 MED ORDER — ONDANSETRON 4 MG PO TBDP
4.0000 mg | ORAL_TABLET | Freq: Three times a day (TID) | ORAL | Status: DC | PRN
  Filled 2020-08-12: qty 1

## 2020-08-12 MED ORDER — HYDROMORPHONE HCL 1 MG/ML IJ SOLN
1.0000 mg | Freq: Once | INTRAMUSCULAR | Status: AC
  Administered 2020-08-12: 1 mg via INTRAVENOUS
  Filled 2020-08-12: qty 1

## 2020-08-12 NOTE — H&P (Signed)
History and Physical    Brandon [name] YIR:[id number] DOB: 1955-12-21 DOA: 08/11/2020  PCP: Luetta Nutting, DO (Confirm with patient/family/NH records and if not entered, this has to be entered at [facility] point of entry) Patient coming from: Home  I have personally briefly reviewed patient's old medical records in [place]  Chief Complaint: Chest pain  HPI: Brandon [name] is a 64 y.o. male with medical history significant of IDDM, HTN, HLD, chronic back pain chronic pain, narcotic dependent, diabetic gastroparesis, anxiety depression, presented with new onset of chest pain.  Patient started on and off chest pains for last week.  Described as pressure-like, retrosternal, nonradiating, lasting few minutes to few hours and resolve prednisone.  Worsened with exertion, sometimes with cough and chest movement.  Has been taking some as needed ibuprofen with some relief.  However 2 days ago, while he was walking his dog, he started to feel more severe episode, 8-9/10, nonradiating, and persistent throughout the day.  This time the ibuprofen did not work, and chest pain persisted throughout the day.  He denied any fever chills sore throat the week before.  Denied any associated symptoms of nausea vomiting palpitation shortness of breath or sweating.  ED Course: Blood pressure significantly elevated, troponin negative x2, EKG nonischemic, ED physician discussed with cardiology who recommended CT angiogram to rule out dissection, which was done negative for dissection.  Patient continued to have chest pain throughout pain, was given morphine x4 with somewhat relief.  At the time I saw the patient, he still having 3/10 chest pain.  Review of Systems: As per HPI otherwise 14 point review of systems negative.    Past Medical History:  Diagnosis Date  . Anxiety   . Basal cell carcinoma (BCC) in situ of skin   . Colon polyps   . Depression   . Diabetes mellitus without complication ([place])   .  Hypertension   . SBO (small bowel obstruction) ([facility])     Past Surgical History:  Procedure Laterality Date  . BACK SURGERY    . CATARACT EXTRACTION W/ INTRAOCULAR LENS  IMPLANT, BILATERAL    . CHOLECYSTECTOMY    . CHOLECYSTECTOMY, LAPAROSCOPIC    . EYE SURGERY    . LUMBAR FUSION     L3-5 with rods  . XI ROBOTIC ASSISTED INGUINAL HERNIA REPAIR WITH MESH     x 3 surgeries     reports that he has quit smoking. He has never used smokeless tobacco. He reports previous alcohol use. He reports previous drug use.  Allergies  Allergen Reactions  . Kiwi Extract Swelling    Mouth swelling.   . Other Swelling    EGGPLANT. Mouth swelling.  Marland Kitchen Penicillins Rash  . Ancef [Cefazolin] Rash  . Latex Rash  . Levaquin [Levofloxacin] Rash    Family History  Problem Relation Age of Onset  . COPD Mother   . Anxiety disorder Mother   . Depression Mother   . Cancer Father   . Anxiety disorder Father   . Depression Father   . Other Father      Prior to Admission medications   Medication Sig Start Date End Date Taking? Authorizing Provider  amLODipine (NORVASC) 10 MG tablet Take 10 mg by mouth daily.    [provider]  ARIPiprazole (ABILIFY) 10 MG tablet Take 10 mg by mouth daily.    [provider]  aspirin 81 MG chewable tablet Chew 81 mg by mouth daily.    [provider]  buPROPion (WELLBUTRIN SR) 150 MG 12 hr tablet Take 150 mg by mouth 2 (two) times daily.    [provider]  buPROPion (WELLBUTRIN XL) 300 MG 24 hr tablet Take by mouth.    [provider]  empagliflozin (JARDIANCE) 25 MG TABS tablet Take 25 mg by mouth daily.    [provider]  Eszopiclone (ESZOPICLONE) 3 MG TABS Take 3 mg by mouth at bedtime. LUNESTA.    [provider]  ibuprofen (ADVIL) 800 MG tablet Take 800 mg by mouth every 8 (eight) hours as needed.    [provider]  insulin degludec (TRESIBA FLEXTOUCH) 100 UNIT/ML FlexTouch Pen Inject 60  Units into the skin daily. 06/08/20 09/06/20  Luetta Nutting, DO  lovastatin (ALTOPREV) 40 MG 24 hr tablet Take 40 mg by mouth at bedtime.    [provider]  metoCLOPramide (REGLAN) 10 MG tablet Take 1 tablet (10 mg total) by mouth 3 (three) times daily with meals for 7 days. 04/17/20 06/02/20  Alma Friendly, MD  metoCLOPramide (REGLAN) 10 MG tablet Take 10 mg by mouth every 8 (eight) hours as needed for nausea. PRN    [provider]  Multiple Vitamins-Minerals (OCUVITE PO) Take by mouth. Patient not taking: Reported on 07/27/2020    [provider]  ondansetron (ZOFRAN ODT) 4 MG disintegrating tablet Take 1 tablet (4 mg total) by mouth every 8 (eight) hours as needed for nausea or vomiting. 04/17/20   Alma Friendly, MD  pantoprazole (PROTONIX) 40 MG tablet Take 40 mg by mouth daily.    [provider]  pioglitazone (ACTOS) 30 MG tablet Take 30 mg by mouth daily.    [provider]  SM Multiple Vitamins Essential TABS Take 1 tablet by mouth daily. Patient not taking: Reported on 07/27/2020    [provider]  tamsulosin (FLOMAX) 0.4 MG CAPS capsule Take 0.4 mg by mouth.    [provider]  Testosterone Cypionate 200 MG/ML SOLN Inject 200 mg as directed once a week.    [provider]  tiZANidine (ZANAFLEX) 4 MG tablet Take 1 tablet (4 mg total) by mouth every 8 (eight) hours as needed for muscle spasms. 07/26/20   Silverio Decamp, MD  traMADol (ULTRAM) 50 MG tablet Take 1 tablet (50 mg total) by mouth 3 (three) times daily as needed. Patient taking differently: Take 50 mg by mouth 3 (three) times daily as needed. 50-100 mg 07/26/20   Silverio Decamp, MD  triazolam (HALCION) 0.25 MG tablet 1-2 tabs PO 2 hours before procedure or imaging.  Do not drive with this medication. 07/26/20   Silverio Decamp, MD    Physical Exam: Vitals:   08/12/20 1502 08/12/20 1504 08/12/20 1607 08/12/20 1620  BP: (!)  143/84  (!) 168/81   Pulse: 100  88   Resp: (!) 26  16   Temp:  98.4 F (36.9 C) 98.5 F (36.9 C)   TempSrc:  Oral Oral   SpO2: 96%  96%   Weight:    97.5 kg  Height:    _0  (1.753 m)    Constitutional: NAD, calm, comfortable Vitals:   08/12/20 1502 08/12/20 1504 08/12/20 1607 08/12/20 1620  BP: (!) 143/84  (!) 168/81   Pulse: 100  88   Resp: (!) 26  16   Temp:  98.4 F (36.9 C) 98.5 F (36.9 C)   TempSrc:  Oral Oral   SpO2: 96%  96%  Weight:    97.5 kg  Height:    _0  (1.753 m)   Eyes: PERRL, lids and conjunctivae normal ENMT: Mucous membranes are moist. Posterior pharynx clear of any exudate or lesions.Normal dentition.  Neck: normal, supple, no masses, no thyromegaly Respiratory: clear to auscultation bilaterally, no wheezing, no crackles. Normal respiratory effort. No accessory muscle use.  Cardiovascular: Regular rate and rhythm, no murmurs / rubs / gallops. No extremity edema. 2+ pedal pulses. No carotid bruits. Mild tenderness on chest wall. Abdomen: no tenderness, no masses palpated. No hepatosplenomegaly. Bowel sounds positive.  Musculoskeletal: no clubbing / cyanosis. No joint deformity upper and lower extremities. Good ROM, no contractures. Normal muscle tone.  Skin: no rashes, lesions, ulcers. No induration Neurologic: CN 2-12 grossly intact. Sensation intact, DTR normal. Strength 5/5 in all 4.  Psychiatric: Normal judgment and insight. Alert and oriented x 3. Normal mood.    Labs on Admission: I have personally reviewed following labs and imaging studies  CBC: Recent Labs  Lab 08/11/20 1415  WBC 6.2  HGB 15.8  HCT 47.6  MCV 86.2  PLT 096   Basic Metabolic Panel: Recent Labs  Lab 08/11/20 1415  NA 136  K 3.2*  CL 101  CO2 22  GLUCOSE 217*  BUN 7*  CREATININE 0.86  CALCIUM 8.9   GFR: Estimated Creatinine Clearance: 99.9 mL/min (by C-G formula based on SCr of 0.86 mg/dL). Liver Function Tests: Recent Labs  Lab 08/12/20 0033  AST 24   ALT 14  ALKPHOS 65  BILITOT 0.9  PROT 7.0  ALBUMIN 3.8   Recent Labs  Lab 08/12/20 0033  LIPASE 38   No results for input(s): AMMONIA in the last 168 hours. Coagulation Profile: No results for input(s): INR, PROTIME in the last 168 hours. Cardiac Enzymes: No results for input(s): CKTOTAL, CKMB, CKMBINDEX, TROPONINI in the last 168 hours. BNP (last 3 results) No results for input(s): PROBNP in the last 8760 hours. HbA1C: No results for input(s): HGBA1C in the last 72 hours. CBG: Recent Labs  Lab 08/11/20 2246 08/12/20 1158  GLUCAP 98 184*   Lipid Profile: No results for input(s): CHOL, HDL, LDLCALC, TRIG, CHOLHDL, LDLDIRECT in the last 72 hours. Thyroid Function Tests: No results for input(s): TSH, T4TOTAL, FREET4, T3FREE, THYROIDAB in the last 72 hours. Anemia Panel: No results for input(s): VITAMINB12, FOLATE, FERRITIN, TIBC, IRON, RETICCTPCT in the last 72 hours. Urine analysis:    Component Value Date/Time   COLORURINE YELLOW 04/14/2020 1908   APPEARANCEUR CLEAR 04/14/2020 1908   LABSPEC 1.014 04/14/2020 1908   PHURINE 5.0 04/14/2020 1908   GLUCOSEU 50 (A) 04/14/2020 1908   HGBUR NEGATIVE 04/14/2020 1908   BILIRUBINUR negative 06/22/2020 1150   KETONESUR negative 06/22/2020 [place] 04/14/2020 1908   PROTEINUR 30 (A) 04/14/2020 1908   UROBILINOGEN 0.2 06/22/2020 1150   NITRITE Negative 06/22/2020 1150   NITRITE NEGATIVE 04/14/2020 1908   LEUKOCYTESUR Negative 06/22/2020 1150   LEUKOCYTESUR NEGATIVE 04/14/2020 1908    Radiological Exams on Admission: DG Chest 2 View  Result Date: 08/11/2020 CLINICAL DATA:  Chest pain EXAM: CHEST - 2 VIEW COMPARISON:  04/08/2020 FINDINGS: Cardiac and mediastinal contours normal. Pulmonary vascularity normal. Mild scarring in the left lung base unchanged. No acute infiltrate or effusion. Spinal cord stimulator unchanged. IMPRESSION: No active cardiopulmonary disease. Electronically Signed   By: Franchot Gallo M.D.   On: 08/11/2020 14:51   CT Angio Chest/Abd/Pel for Dissection W and/or Wo Contrast  Result  Date: 08/11/2020 CLINICAL DATA:  Abdominal pain, mid sternal chest pain with exertion for 3 days. Known today pain is constant. Hypertensive. Of aortic dissection suspected. EXAM: CT ANGIOGRAPHY CHEST, ABDOMEN AND PELVIS TECHNIQUE: Non-contrast CT of the chest was initially obtained. Multidetector CT imaging through the chest, abdomen and pelvis was performed using the standard protocol during bolus administration of intravenous contrast. Multiplanar reconstructed images and MIPs were obtained and reviewed to evaluate the vascular anatomy. CONTRAST:  162m OMNIPAQUE IOHEXOL 350 MG/ML SOLN COMPARISON:  CT abdomen pelvis 04/14/2020. FINDINGS: VASCULAR Aorta: Mild atherosclerotic plaque. Normal caliber aorta without aneurysm, dissection, vasculitis or significant stenosis. Celiac: Patent without evidence of aneurysm, dissection, vasculitis or significant stenosis. SMA: Patent without evidence of aneurysm, dissection, vasculitis or significant stenosis. Renals: Both renal arteries are patent without evidence of aneurysm, dissection, vasculitis, fibromuscular dysplasia or significant stenosis. IMA: Patent without evidence of aneurysm, dissection, vasculitis or significant stenosis. Inflow: Mild atherosclerotic plaque. Patent without evidence of aneurysm, dissection, vasculitis or significant stenosis. Veins: No obvious venous abnormality within the limitations of this arterial phase study. Review of the MIP images confirms the above findings. NON-VASCULAR Cardiovascular: Preferential opacification of the thoracic aorta. Normal heart size. No pericardial effusion. Mild left anterior descending coronary artery calcifications. Mediastinum/Nodes: Pre-vascular lymph node calcification. No enlarged mediastinal, hilar, or axillary lymph nodes. Thyroid gland, trachea, and esophagus demonstrate no significant findings. The  main pulmonary artery is normal in caliber. No central pulmonary embolus. Lungs/Pleura: Left posterior pleura calcification. Calcified granuloma at the left base. No pulmonary mass. No focal consolidation. Left lower lobe scarring. No pleural effusion. No pneumothorax. Musculoskeletal: No chest wall abnormality. No suspicious lytic or blastic osseous lesions. No acute displaced fracture. Multilevel degenerative changes of the spine. Lower back neurostimulator with electrodes terminating in the posterior spinal canal. Hepatobiliary: No focal liver abnormality is seen. Status post cholecystectomy. No biliary dilatation. Pancreas: For No focal lesion. Normal pancreatic contour. No surrounding inflammatory changes. No main pancreatic ductal dilatation. Spleen: Couple of punctate calcifications within the splenic parenchyma likely represent sequelae of prior granulomatous disease. normal in size without focal abnormality. Adrenals/Urinary Tract: No adrenal nodule bilaterally. Bilateral kidneys enhance symmetrically. Similar-appearing 2.7 cm fluid density lesion within the right kidney that likely represents a simple renal cyst. No hydronephrosis. No hydroureter. The urinary bladder is distended with urine and grossly unremarkable. Stomach/Bowel: Stomach is within normal limits. No evidence of bowel wall thickening or dilatation. Scattered colonic diverticulosis appendix appears normal. Lymphatic: No abdominal or pelvic lymphadenopathy. Reproductive: Penile prosthesis is noted with a reservoir within the left anterior lower abdomen. The prostate is enlarged measuring up to 5.6 cm. Other: No intraperitoneal free fluid. No intraperitoneal free gas. No organized fluid collection. Musculoskeletal: No abdominal wall hernia or abnormality. L4-L5 laminectomy and posterolateral fusion. No suspicious lytic or blastic osseous lesions. No acute displaced fracture. Multilevel degenerative changes of the spine. Review of the MIP  images confirms the above findings. IMPRESSION: 1. No aortic aneurysm or dissection. Aortic Atherosclerosis (ICD10-I70.0) - mild. 2. No acute intrathoracic, intra-abdominal, or intrapelvic abnormality in a patient with prior granulomatous disease. 3. Prostatomegaly. 4. Scattered colonic diverticulosis with no acute diverticulitis. Electronically Signed   By: MIven FinnM.D.   On: 08/11/2020 21:40    EKG: Independently reviewed. PVCs, no acute ST changes  Assessment/Plan Active Problems:   Chest pain  (please populate well all problems here in Problem List. (For example, if patient is on BP meds at home and you resume or decide to hold them,  it is a problem that needs to be her. Same for CAD, COPD, HLD and so on)  Atypical chest pain -ACS ruled out.  Dissection ruled out with CT angiogram and D-dimer within normal limits.  Ordered echo for tomorrow. Petra Kuba of the chest pain appears to be mixture of non-cardiac and cardiac, but given patient has risk factor hypermetric diabetes, and both appeared to be poorly controlled, will do an echocardiogram pending depend on the results outpatient versus inpatient stress test.  Explained to patient and his wife at bedside and both agreed. -Trial of lidocaine patch, ESR, CRP. -Respiratory control, add more BP meds, send A1c and lipid panel.  UDS. -Continue ASA  Uncontrolled hypertension -Continue Norvasc 10 mg, add as needed labetalol  IDDM with hyperglycemia -Resume home regimen of Actos, Jardiance and Januvia and Lantus -Add Sliding scale  HLD -Statin  Anxiety depression -Continue SSRI  Gastroparesis -PRN meds.  DVT prophylaxis: Lovenox Code Status: Full code Family Communication: Wife at bedside Disposition Plan: Likely can be discharged home tomorrow after echocardiogram Consults called: Cardio Admission status: Tele obs   Lequita Halt MD [facility] Pager [phone]  08/12/2020, 5:12 PM

## 2020-08-12 NOTE — ED Notes (Signed)
[facility] at bedside 

## 2020-08-13 ENCOUNTER — Observation Stay (HOSPITAL_COMMUNITY): Payer: No Typology Code available for payment source

## 2020-08-13 DIAGNOSIS — Z85828 Personal history of other malignant neoplasm of skin: Secondary | ICD-10-CM | POA: Diagnosis not present

## 2020-08-13 DIAGNOSIS — E11649 Type 2 diabetes mellitus with hypoglycemia without coma: Secondary | ICD-10-CM | POA: Diagnosis not present

## 2020-08-13 DIAGNOSIS — E1165 Type 2 diabetes mellitus with hyperglycemia: Secondary | ICD-10-CM | POA: Diagnosis present

## 2020-08-13 DIAGNOSIS — R079 Chest pain, unspecified: Secondary | ICD-10-CM | POA: Diagnosis not present

## 2020-08-13 DIAGNOSIS — Z79899 Other long term (current) drug therapy: Secondary | ICD-10-CM | POA: Diagnosis not present

## 2020-08-13 DIAGNOSIS — F32A Depression, unspecified: Secondary | ICD-10-CM | POA: Diagnosis present

## 2020-08-13 DIAGNOSIS — I25118 Atherosclerotic heart disease of native coronary artery with other forms of angina pectoris: Secondary | ICD-10-CM | POA: Diagnosis not present

## 2020-08-13 DIAGNOSIS — I2510 Atherosclerotic heart disease of native coronary artery without angina pectoris: Secondary | ICD-10-CM | POA: Diagnosis not present

## 2020-08-13 DIAGNOSIS — Z91018 Allergy to other foods: Secondary | ICD-10-CM | POA: Diagnosis not present

## 2020-08-13 DIAGNOSIS — Z8249 Family history of ischemic heart disease and other diseases of the circulatory system: Secondary | ICD-10-CM | POA: Diagnosis not present

## 2020-08-13 DIAGNOSIS — Z818 Family history of other mental and behavioral disorders: Secondary | ICD-10-CM | POA: Diagnosis not present

## 2020-08-13 DIAGNOSIS — Z981 Arthrodesis status: Secondary | ICD-10-CM | POA: Diagnosis not present

## 2020-08-13 DIAGNOSIS — Z87891 Personal history of nicotine dependence: Secondary | ICD-10-CM | POA: Diagnosis not present

## 2020-08-13 DIAGNOSIS — R0789 Other chest pain: Secondary | ICD-10-CM | POA: Diagnosis present

## 2020-08-13 DIAGNOSIS — I25110 Atherosclerotic heart disease of native coronary artery with unstable angina pectoris: Secondary | ICD-10-CM | POA: Diagnosis present

## 2020-08-13 DIAGNOSIS — F419 Anxiety disorder, unspecified: Secondary | ICD-10-CM | POA: Diagnosis present

## 2020-08-13 DIAGNOSIS — G8929 Other chronic pain: Secondary | ICD-10-CM | POA: Diagnosis present

## 2020-08-13 DIAGNOSIS — Z91040 Latex allergy status: Secondary | ICD-10-CM | POA: Diagnosis not present

## 2020-08-13 DIAGNOSIS — I10 Essential (primary) hypertension: Secondary | ICD-10-CM | POA: Diagnosis present

## 2020-08-13 DIAGNOSIS — Z7982 Long term (current) use of aspirin: Secondary | ICD-10-CM | POA: Diagnosis not present

## 2020-08-13 DIAGNOSIS — Z20822 Contact with and (suspected) exposure to covid-19: Secondary | ICD-10-CM | POA: Diagnosis present

## 2020-08-13 DIAGNOSIS — K3184 Gastroparesis: Secondary | ICD-10-CM | POA: Diagnosis present

## 2020-08-13 DIAGNOSIS — I208 Other forms of angina pectoris: Secondary | ICD-10-CM | POA: Diagnosis not present

## 2020-08-13 DIAGNOSIS — E782 Mixed hyperlipidemia: Secondary | ICD-10-CM | POA: Diagnosis not present

## 2020-08-13 DIAGNOSIS — E785 Hyperlipidemia, unspecified: Secondary | ICD-10-CM | POA: Diagnosis present

## 2020-08-13 DIAGNOSIS — Z955 Presence of coronary angioplasty implant and graft: Secondary | ICD-10-CM | POA: Diagnosis not present

## 2020-08-13 DIAGNOSIS — E1143 Type 2 diabetes mellitus with diabetic autonomic (poly)neuropathy: Secondary | ICD-10-CM | POA: Diagnosis present

## 2020-08-13 DIAGNOSIS — F1120 Opioid dependence, uncomplicated: Secondary | ICD-10-CM | POA: Diagnosis present

## 2020-08-13 DIAGNOSIS — M549 Dorsalgia, unspecified: Secondary | ICD-10-CM | POA: Diagnosis present

## 2020-08-13 DIAGNOSIS — Z881 Allergy status to other antibiotic agents status: Secondary | ICD-10-CM | POA: Diagnosis not present

## 2020-08-13 DIAGNOSIS — Z880 Allergy status to penicillin: Secondary | ICD-10-CM | POA: Diagnosis not present

## 2020-08-13 LAB — LIPID PANEL
Cholesterol: 160 mg/dL (ref 0–200)
HDL: 32 mg/dL — ABNORMAL LOW (ref >40)
LDL Cholesterol: 76 mg/dL (ref 0–99)
Total CHOL/HDL Ratio: 5 RATIO
Triglycerides: 258 mg/dL — ABNORMAL HIGH (ref <150)
VLDL: 52 mg/dL — ABNORMAL HIGH (ref 0–40)

## 2020-08-13 LAB — ECHOCARDIOGRAM COMPLETE
Area-P 1/2: 4.71 cm2
Height: 69 in
S' Lateral: 3.4 cm
Weight: 3395.2 oz

## 2020-08-13 LAB — GLUCOSE, CAPILLARY
Glucose-Capillary: 165 mg/dL — ABNORMAL HIGH (ref 70–99)
Glucose-Capillary: 146 mg/dL — ABNORMAL HIGH (ref 70–99)
Glucose-Capillary: 111 mg/dL — ABNORMAL HIGH (ref 70–99)
Glucose-Capillary: 111 mg/dL — ABNORMAL HIGH (ref 70–99)
Glucose-Capillary: 140 mg/dL — ABNORMAL HIGH (ref 70–99)

## 2020-08-13 LAB — TSH: TSH: 2.345 u[IU]/mL (ref 0.350–4.500)

## 2020-08-13 LAB — SEDIMENTATION RATE: Sed Rate: 17 mm/hr — ABNORMAL HIGH (ref 0–16)

## 2020-08-13 LAB — C-REACTIVE PROTEIN: CRP: 1.5 mg/dL — ABNORMAL HIGH (ref <1.0)

## 2020-08-13 LAB — HEMOGLOBIN A1C
Hgb A1c MFr Bld: 10.1 % — ABNORMAL HIGH (ref 4.8–5.6)
Mean Plasma Glucose: 243.17 mg/dL

## 2020-08-13 MED ORDER — METOPROLOL TARTRATE 25 MG PO TABS
25.0000 mg | ORAL_TABLET | Freq: Two times a day (BID) | ORAL | Status: DC
  Administered 2020-08-13 – 2020-08-16 (×6): 25 mg via ORAL
  Filled 2020-08-13 (×6): qty 1

## 2020-08-13 MED ORDER — METOPROLOL TARTRATE 100 MG PO TABS
100.0000 mg | ORAL_TABLET | Freq: Once | ORAL | Status: AC
  Administered 2020-08-14: 100 mg via ORAL
  Filled 2020-08-13: qty 1

## 2020-08-13 NOTE — Consult Note (Addendum)
Cardiology Consultation:   Patient ID: KYRIN GRATZ MRN: [id number]; DOB: Oct 15, 1955  Admit date: 08/11/2020 Date of Consult: 08/13/2020  Primary Care Provider: Everrett Coombe, DO [facility] Cardiologist: New   Patient Profile:   Brandon [name] is a 64 y.o. male with a hx of HTN,DM, HLD, diabetic gastroparesis and anxiety  who is being seen today for the evaluation of chest pain at the request of Dr. Jomarie Longs.   History of Present Illness:   Brandon [name] presented with chest pain.  Patient reported 5 days history of persistent lower sternal chest pressure.  It's been constant for last 5 days at rate of 6/10.  may be radiating to upper chest/throat.  He developed shortness of breath with activity but not at rest.  No associated nausea or vomiting.  Different than his prior GI pain.  Has occasional palpitation but not associated.  Denies dizziness, orthopnea, PND, syncope, lower extremity edema or melena.  Reports compliance with his medication.  He was noted hypertensive upon arrival. CTA of chest without aortic dissection or aneurysm. CXR without acute issue.  Troponin negative x 2 D-dimer negative Reparatory panel negative to Influenza and COVID K 3.2 CRP 1.5 Sed rate 17 TSH normal HgbA1c 10.1 UDS: negative   BP improved, however last reading 154/79     Past Medical History:  Diagnosis Date  . Anxiety   . Basal cell carcinoma (BCC) in situ of skin   . Colon polyps   . Depression   . Diabetes mellitus without complication (HCC)   . Hypertension   . SBO (small bowel obstruction) (HCC)     Past Surgical History:  Procedure Laterality Date  . BACK SURGERY    . CATARACT EXTRACTION W/ INTRAOCULAR LENS  IMPLANT, BILATERAL    . CHOLECYSTECTOMY    . CHOLECYSTECTOMY, LAPAROSCOPIC    . EYE SURGERY    . LUMBAR FUSION     L3-5 with rods  . XI ROBOTIC ASSISTED INGUINAL HERNIA REPAIR WITH MESH     x 3 surgeries     Inpatient Medications: Scheduled Meds: . amLODipine   10 mg Oral Daily  . ARIPiprazole  10 mg Oral Daily  . aspirin  81 mg Oral Daily  . buPROPion  450 mg Oral Daily  . empagliflozin  25 mg Oral Daily  . enoxaparin (LOVENOX) injection  40 mg Subcutaneous Q24H  . insulin aspart  0-15 Units Subcutaneous TID WC  . insulin glargine  60 Units Subcutaneous Daily  . lidocaine  1 patch Transdermal Q24H  . pantoprazole  40 mg Oral Daily  . pioglitazone  30 mg Oral Daily  . pravastatin  40 mg Oral QHS  . tamsulosin  0.4 mg Oral Daily   Continuous Infusions:  PRN Meds: acetaminophen, ibuprofen, labetalol, metoCLOPramide, nitroGLYCERIN, ondansetron (ZOFRAN) IV, ondansetron, tiZANidine, traMADol  Allergies:    Allergies  Allergen Reactions  . Kiwi Extract Swelling    Mouth swelling.   . Other Swelling    EGGPLANT. Mouth swelling.  Marland Kitchen Penicillins Rash  . Ancef [Cefazolin] Rash  . Latex Rash  . Levaquin [Levofloxacin] Rash    Social History:   Social History   Socioeconomic History  . Marital status: Married    Spouse name: Not on file  . Number of children: Not on file  . Years of education: Not on file  . Highest education level: Not on file  Occupational History  . Not on file  Tobacco Use  . Smoking status: Former Games developer  .  Smokeless tobacco: Never Used  Substance and Sexual Activity  . Alcohol use: Not Currently  . Drug use: Not Currently  . Sexual activity: Yes  Other Topics Concern  . Not on file  Social History Narrative  . Not on file   Social Determinants of Health   Financial Resource Strain: Not on file  Food Insecurity: Not on file  Transportation Needs: Not on file  Physical Activity: Not on file  Stress: Not on file  Social Connections: Not on file  Intimate Partner Violence: Not on file    Family History:   Family History  Problem Relation Age of Onset  . COPD Mother   . Anxiety disorder Mother   . Depression Mother   . Cancer Father   . Anxiety disorder Father   . Depression Father   . Other  Father      ROS:  Please see the history of present illness.  All other ROS reviewed and negative.     Physical Exam/Data:   Vitals:   08/12/20 2053 08/13/20 0503 08/13/20 0505 08/13/20 0957  BP: 133/78 135/85  (!) 154/79  Pulse: 93 86  100  Resp: Temp: 97.6 F (36.4 C) (!) 97.5 F (36.4 C)  97.7 F (36.5 C)  TempSrc: Oral Oral  Oral  SpO2: 95% 97%  95%  Weight:   96.3 kg   Height:        Intake/Output Summary (Last 24 hours) at 08/13/2020 1153 Last data filed at 08/13/2020 0500 Gross per 24 hour  Intake 720 ml  Output 1785 ml  Net -1065 ml   Last 3 Weights 08/13/2020 08/12/2020 08/11/2020  Weight (lbs) 212 lb 3.2 oz 215 lb 217 lb  Weight (kg) 96.253 kg 97.523 kg 98.431 kg     Body mass index is 31.34 kg/m.  General:  Well nourished, well developed, in no acute distress HEENT: normal Lymph: no adenopathy Neck: no JVD Endocrine:  No thryomegaly Vascular: No carotid bruits; FA pulses 2+ bilaterally without bruits  Cardiac:  normal S1, S2; RRR; no murmur  Lungs:  clear to auscultation bilaterally, no wheezing, rhonchi or rales  Abd: soft, nontender, no hepatomegaly  Ext: no edema Musculoskeletal:  No deformities, BUE and BLE strength normal and equal Skin: warm and dry  Neuro:  CNs 2-12 intact, no focal abnormalities noted Psych:  Normal affect   EKG:  The EKG was personally reviewed and demonstrates:  NSR Telemetry:  Telemetry was personally reviewed and demonstrates: Mostly sinus rhythm at 80s, intermittent tachycardic in 110s  Relevant CV Studies:  Echo 08/13/2020 1. Left ventricular ejection fraction, by estimation, is 60 to 65%. The  left ventricle has normal function. The left ventricle has no regional  wall motion abnormalities. There is mild left ventricular hypertrophy.  Left ventricular diastolic parameters  are consistent with Grade I diastolic dysfunction (impaired relaxation).  2. Right ventricular systolic function is normal. The  right ventricular  size is normal.  3. The mitral valve is normal in structure. No evidence of mitral valve  regurgitation. No evidence of mitral stenosis.  4. The aortic valve is tricuspid. Aortic valve regurgitation is not  visualized. No aortic stenosis is present.  5. The inferior vena cava is normal in size with greater than 50%  respiratory variability, suggesting right atrial pressure of 3 mmHg.   Laboratory Data:  High Sensitivity Troponin:   Recent Labs  Lab 08/11/20 1415 08/11/20 1623  TROPONINIHS 4 5  Chemistry Recent Labs  Lab 08/11/20 1415  NA 136  K 3.2*  CL 101  CO2 22  GLUCOSE 217*  BUN 7*  CREATININE 0.86  CALCIUM 8.9  GFRNONAA >60  ANIONGAP 13    Recent Labs  Lab 08/12/20 0033  PROT 7.0  ALBUMIN 3.8  AST 24  ALT 14  ALKPHOS 65  BILITOT 0.9   Hematology Recent Labs  Lab 08/11/20 1415  WBC 6.2  RBC 5.52  HGB 15.8  HCT 47.6  MCV 86.2  MCH 28.6  MCHC 33.2  RDW 16.6*  PLT 232   BNPNo results for input(s): BNP, PROBNP in the last 168 hours.  DDimer  Recent Labs  Lab 08/11/20 1623  DDIMER 0.31     Radiology/Studies:  DG Chest 2 View  Result Date: 08/11/2020 CLINICAL DATA:  Chest pain EXAM: CHEST - 2 VIEW COMPARISON:  04/08/2020 FINDINGS: Cardiac and mediastinal contours normal. Pulmonary vascularity normal. Mild scarring in the left lung base unchanged. No acute infiltrate or effusion. Spinal cord stimulator unchanged. IMPRESSION: No active cardiopulmonary disease. Electronically Signed   By: Marlan Palau M.D.   On: 08/11/2020 14:51   ECHOCARDIOGRAM COMPLETE  Result Date: 08/13/2020    ECHOCARDIOGRAM REPORT   Patient Name:   JULIES CARMICKLE Date of Exam: 08/13/2020 Medical Rec #:  [id number]      Height:       69.0 in Accession #:    [id number]     Weight:       212.2 lb Date of Birth:  [birth year]      BSA:          2.119 m Patient Age:    64 years       BP:           154/79 mmHg Patient Gender: M              HR:            92 bpm. Exam Location:  Inpatient Procedure: 2D Echo Indications:    chest pain  History:        Patient has no prior history of Echocardiogram examinations.                 Signs/Symptoms:Chest Pain; Risk Factors:Hypertension,                 Dyslipidemia and Diabetes.  Sonographer:    Delcie Roch Referring Phys: [id number] PING T ZHANG IMPRESSIONS  1. Left ventricular ejection fraction, by estimation, is 60 to 65%. The left ventricle has normal function. The left ventricle has no regional wall motion abnormalities. There is mild left ventricular hypertrophy. Left ventricular diastolic parameters are consistent with Grade I diastolic dysfunction (impaired relaxation).  2. Right ventricular systolic function is normal. The right ventricular size is normal.  3. The mitral valve is normal in structure. No evidence of mitral valve regurgitation. No evidence of mitral stenosis.  4. The aortic valve is tricuspid. Aortic valve regurgitation is not visualized. No aortic stenosis is present.  5. The inferior vena cava is normal in size with greater than 50% respiratory variability, suggesting right atrial pressure of 3 mmHg. FINDINGS  Left Ventricle: Left ventricular ejection fraction, by estimation, is 60 to 65%. The left ventricle has normal function. The left ventricle has no regional wall motion abnormalities. The left ventricular internal cavity size was normal in size. There is  mild left ventricular hypertrophy. Left ventricular diastolic parameters are consistent with Grade I diastolic dysfunction (  impaired relaxation). Normal left ventricular filling pressure. Right Ventricle: The right ventricular size is normal. No increase in right ventricular wall thickness. Right ventricular systolic function is normal. Left Atrium: Left atrial size was normal in size. Right Atrium: Right atrial size was normal in size. Pericardium: There is no evidence of pericardial effusion. Mitral Valve: The mitral valve is normal in  structure. No evidence of mitral valve regurgitation. No evidence of mitral valve stenosis. Tricuspid Valve: The tricuspid valve is normal in structure. Tricuspid valve regurgitation is not demonstrated. No evidence of tricuspid stenosis. Aortic Valve: The aortic valve is tricuspid. Aortic valve regurgitation is not visualized. No aortic stenosis is present. Pulmonic Valve: The pulmonic valve was not well visualized. Pulmonic valve regurgitation is not visualized. No evidence of pulmonic stenosis. Aorta: The aortic root is normal in size and structure. Venous: The inferior vena cava is normal in size with greater than 50% respiratory variability, suggesting right atrial pressure of 3 mmHg. IAS/Shunts: No atrial level shunt detected by color flow Doppler.  LEFT VENTRICLE PLAX 2D LVIDd:         4.70 cm  Diastology LVIDs:         3.40 cm  LV e' medial:    6.20 cm/s LV PW:         1.10 cm  LV E/e' medial:  11.1 LV IVS:        0.90 cm  LV e' lateral:   9.46 cm/s LVOT diam:     2.20 cm  LV E/e' lateral: 7.3 LV SV:         65 LV SV Index:   31 LVOT Area:     3.80 cm  RIGHT VENTRICLE             IVC RV S prime:     12.80 cm/s  IVC diam: 1.60 cm TAPSE (M-mode): 1.7 cm LEFT ATRIUM             Index       RIGHT ATRIUM           Index LA diam:        4.00 cm 1.89 cm/m  RA Area:     12.50 cm LA Vol (A2C):   34.6 ml 16.33 ml/m RA Volume:   26.20 ml  12.37 ml/m LA Vol (A4C):   34.3 ml 16.19 ml/m LA Biplane Vol: 35.3 ml 16.66 ml/m  AORTIC VALVE LVOT Vmax:   107.41 cm/s LVOT Vmean:  69.396 cm/s LVOT VTI:    0.171 m  AORTA Ao Root diam: 3.20 cm Ao Asc diam:  3.20 cm MITRAL VALVE MV Area (PHT): 4.71 cm     SHUNTS MV Decel Time: 161 msec     Systemic VTI:  0.17 m MV E velocity: 69.00 cm/s   Systemic Diam: 2.20 cm MV A velocity: 105.00 cm/s MV E/A ratio:  0.66 Dina Rich MD Electronically signed by Dina Rich MD Signature Date/Time: 08/13/2020/11:35:37 AM    Final    CT Angio Chest/Abd/Pel for Dissection W and/or Wo  Contrast  Result Date: 08/11/2020 CLINICAL DATA:  Abdominal pain, mid sternal chest pain with exertion for 3 days. Known today pain is constant. Hypertensive. Of aortic dissection suspected. EXAM: CT ANGIOGRAPHY CHEST, ABDOMEN AND PELVIS TECHNIQUE: Non-contrast CT of the chest was initially obtained. Multidetector CT imaging through the chest, abdomen and pelvis was performed using the standard protocol during bolus administration of intravenous contrast. Multiplanar reconstructed images and MIPs were obtained and reviewed to evaluate  the vascular anatomy. CONTRAST:  100mL OMNIPAQUE IOHEXOL 350 MG/ML SOLN COMPARISON:  CT abdomen pelvis 04/14/2020. FINDINGS: VASCULAR Aorta: Mild atherosclerotic plaque. Normal caliber aorta without aneurysm, dissection, vasculitis or significant stenosis. Celiac: Patent without evidence of aneurysm, dissection, vasculitis or significant stenosis. SMA: Patent without evidence of aneurysm, dissection, vasculitis or significant stenosis. Renals: Both renal arteries are patent without evidence of aneurysm, dissection, vasculitis, fibromuscular dysplasia or significant stenosis. IMA: Patent without evidence of aneurysm, dissection, vasculitis or significant stenosis. Inflow: Mild atherosclerotic plaque. Patent without evidence of aneurysm, dissection, vasculitis or significant stenosis. Veins: No obvious venous abnormality within the limitations of this arterial phase study. Review of the MIP images confirms the above findings. NON-VASCULAR Cardiovascular: Preferential opacification of the thoracic aorta. Normal heart size. No pericardial effusion. Mild left anterior descending coronary artery calcifications. Mediastinum/Nodes: Pre-vascular lymph node calcification. No enlarged mediastinal, hilar, or axillary lymph nodes. Thyroid gland, trachea, and esophagus demonstrate no significant findings. The main pulmonary artery is normal in caliber. No central pulmonary embolus. Lungs/Pleura:  Left posterior pleura calcification. Calcified granuloma at the left base. No pulmonary mass. No focal consolidation. Left lower lobe scarring. No pleural effusion. No pneumothorax. Musculoskeletal: No chest wall abnormality. No suspicious lytic or blastic osseous lesions. No acute displaced fracture. Multilevel degenerative changes of the spine. Lower back neurostimulator with electrodes terminating in the posterior spinal canal. Hepatobiliary: No focal liver abnormality is seen. Status post cholecystectomy. No biliary dilatation. Pancreas: For No focal lesion. Normal pancreatic contour. No surrounding inflammatory changes. No main pancreatic ductal dilatation. Spleen: Couple of punctate calcifications within the splenic parenchyma likely represent sequelae of prior granulomatous disease. normal in size without focal abnormality. Adrenals/Urinary Tract: No adrenal nodule bilaterally. Bilateral kidneys enhance symmetrically. Similar-appearing 2.7 cm fluid density lesion within the right kidney that likely represents a simple renal cyst. No hydronephrosis. No hydroureter. The urinary bladder is distended with urine and grossly unremarkable. Stomach/Bowel: Stomach is within normal limits. No evidence of bowel wall thickening or dilatation. Scattered colonic diverticulosis appendix appears normal. Lymphatic: No abdominal or pelvic lymphadenopathy. Reproductive: Penile prosthesis is noted with a reservoir within the left anterior lower abdomen. The prostate is enlarged measuring up to 5.6 cm. Other: No intraperitoneal free fluid. No intraperitoneal free gas. No organized fluid collection. Musculoskeletal: No abdominal wall hernia or abnormality. L4-L5 laminectomy and posterolateral fusion. No suspicious lytic or blastic osseous lesions. No acute displaced fracture. Multilevel degenerative changes of the spine. Review of the MIP images confirms the above findings. IMPRESSION: 1. No aortic aneurysm or dissection. Aortic  Atherosclerosis (ICD10-I70.0) - mild. 2. No acute intrathoracic, intra-abdominal, or intrapelvic abnormality in a patient with prior granulomatous disease. 3. Prostatomegaly. 4. Scattered colonic diverticulosis with no acute diverticulitis. Electronically Signed   By: Tish FredericksonMorgane  Naveau M.D.   On: 08/11/2020 21:40     Assessment and Plan:   1.  Chest pressure - Presented with 5 days history of persistent 6 out of 10 lower sternal chest pressure.  He reports shortness of breath with activity.  Troponin negative x2.  EKG nonischemic.  Patient reports compliance with medication.  He was hypertensive on arrival.  Echocardiogram with preserved LV function, no wall motion abnormality and grade 1 diastolic dysfunction.  No valvular abnormality noted.  CT angio of chest without dissection or aneurysm.  D-dimer normal. -Minimally elevated sed rate and CRP.  He does not have pleuritic symptoms or EKG concerning for pericarditis.  Denies recent illness. -Patient has been ruled out however he does have cardiac risk  factor including uncontrolled diabetes mellitus and hypertension.  Remote history of 30-pack-year tobacco smoking, quit in 2007. -Will review plan with MD -Continue aspirin and statin  2.  Hypertension -Intermittently elevated -Continue amlodipine 10 mg daily -Blocker which also antianginal.  3.  Uncontrolled diabetes mellitus -Primary team   :[id number]} HEAR Score (for undifferentiated chest pain):  HEAR Score: 4   For questions or updates, please contact [facility] Please consult [email or web address] for contact info under    Lorelei Pont, PA  08/13/2020 11:53 AM   Patient seen and examined and agree with Manson Passey, PA.  In brief, the patient is a 64 year old male with history of HTN, HLD, DMII, gastroparesis and anxiety who presented with chest pain for which we have been consulted.  Pain sounds atypical and likely MSK as made worse with certain arm movements and  "the impact associated with walking." Trop negative, ECG without ischemic changes. CTA chest with some calcification in LAD, however, and patient has numerous risk factors including poorly controlled DMII, HTN, HLD and sedentary lifestyle. Discussed the option of out-patient stress vs in-patient CTA and the patient would like to proceed with inpatient CTA for further evaluation of coronaries in addition to medical therapy.  Exam: GEN: No acute distress.   Neck: No JVD Cardiac: RRR, no murmurs, rubs, or gallops.  Respiratory: Clear to auscultation bilaterally. GI: Soft, nontender, non-distended  MS: No edema; No deformity. Neuro:  Nonfocal  Psych: Normal affect   Plan: -Check coronary CTA  -Continue ASA and statin -Will start metoprolol---change to long acting prior to discharge -Continue amlodipine -Will need aggressive medical management of underlying DMII as A1C 10.1 -If CTA without significant obstructive disease, can discharge home with cardiology follow-up  Laurance Flatten, MD

## 2020-08-13 NOTE — Progress Notes (Addendum)
PROGRESS NOTE    Brandon [name]  FYB:[id number] DOB: 1955-12-07 DOA: 08/11/2020 PCP: Everrett Coombe, DO  Brief Narrative: 64 year old male with history of type 2 diabetes mellitus on insulin, hypertension, dyslipidemia, chronic back pain, gastroparesis, anxiety and depression presented to the ED with chest pain off and on since Wednesday 12/15. -Pressure-like, worsened with exertion -Work-up in the ED noted negative troponins, nonischemic EKG   Assessment & Plan:   Atypical chest pain with some typical features -EKG unremarkable, high-sensitivity troponins are negative -D-dimer and CTA chest negative for PE -Given multiple cardiac risk factors will request cardiology evaluation for possible coronary CTA versus Myoview  Hypertension -Continue amlodipine  Uncontrolled type 2 diabetes mellitus -Hemoglobin A1c is 10.1 -Continue Lantus, Actos, Jardiance -CBG stable here, sliding scale insulin  Anxiety Depression -Continue SSRI, Abilify, Wellbutrin  Chronic back pain -Continue home regimen of tramadol/Zanaflex  DVT prophylaxis: Lovenox Code Status: Full code Family Communication: No family at bedside Disposition Plan:  Status is: Observation not stable for discharge due to ongoing work-up  Dispo: The patient is from: Home              Anticipated d/c is to: Home              Anticipated d/c date is: 1 day              Patient currently is not medically stable to d/c.  Consultants:  Cardiology  Procedures:   Antimicrobials:    Subjective: -Feels okay, continues to have mild chest discomfort  Objective: Vitals:   08/12/20 2053 08/13/20 0503 08/13/20 0505 08/13/20 0957  BP: 133/78 135/85  (!) 154/79  Pulse: 93 86  100  Resp: 20 12  15   Temp: 97.6 F (36.4 C) (!) 97.5 F (36.4 C)  97.7 F (36.5 C)  TempSrc: Oral Oral  Oral  SpO2: 95% 97%  95%  Weight:   96.3 kg   Height:        Intake/Output Summary (Last 24 hours) at 08/13/2020 1401 Last data filed at  08/13/2020 0500 Gross per 24 hour  Intake 720 ml  Output 1785 ml  Net -1065 ml   Filed Weights   08/11/20 1404 08/12/20 1620 08/13/20 0505  Weight: 98.4 kg 97.5 kg 96.3 kg    Examination:  General exam: AAOx3, no distress Respiratory system: Clear to auscultation. Respiratory effort normal. Cardiovascular system: S1 & S2 heard, RRR.  Gastrointestinal system: Abdomen is nondistended, soft and nontender Central nervous system: Alert and oriented. No focal neurological deficits. Extremities: Symmetric 5 x 5 power. Skin: No rashes on exposed Psychiatry: Judgement and insight appear normal. Mood & affect appropriate.     Data Reviewed:   CBC: Recent Labs  Lab 08/11/20 1415  WBC 6.2  HGB 15.8  HCT 47.6  MCV 86.2  PLT 232   Basic Metabolic Panel: Recent Labs  Lab 08/11/20 1415  NA 136  K 3.2*  CL 101  CO2 22  GLUCOSE 217*  BUN 7*  CREATININE 0.86  CALCIUM 8.9   GFR: Estimated Creatinine Clearance: 99.3 mL/min (by C-G formula based on SCr of 0.86 mg/dL). Liver Function Tests: Recent Labs  Lab 08/12/20 0033  AST 24  ALT 14  ALKPHOS 65  BILITOT 0.9  PROT 7.0  ALBUMIN 3.8   Recent Labs  Lab 08/12/20 0033  LIPASE 38   No results for input(s): AMMONIA in the last 168 hours. Coagulation Profile: No results for input(s): INR, PROTIME in the last 168 hours.  Cardiac Enzymes: No results for input(s): CKTOTAL, CKMB, CKMBINDEX, TROPONINI in the last 168 hours. BNP (last 3 results) No results for input(s): PROBNP in the last 8760 hours. HbA1C: Recent Labs    08/13/20 0119  HGBA1C 10.1*   CBG: Recent Labs  Lab 08/12/20 1158 08/12/20 2152 08/13/20 0517 08/13/20 0820 08/13/20 1151  GLUCAP 184* 192* 165* 146* 111*   Lipid Profile: Recent Labs    08/13/20 0119  CHOL 160  HDL 32*  LDLCALC 76  TRIG 627*  CHOLHDL 5.0   Thyroid Function Tests: Recent Labs    08/13/20 0119  TSH 2.345   Anemia Panel: No results for input(s): VITAMINB12,  FOLATE, FERRITIN, TIBC, IRON, RETICCTPCT in the last 72 hours. Urine analysis:    Component Value Date/Time   COLORURINE YELLOW 04/14/2020 1908   APPEARANCEUR CLEAR 04/14/2020 1908   LABSPEC 1.014 04/14/2020 1908   PHURINE 5.0 04/14/2020 1908   GLUCOSEU 50 (A) 04/14/2020 1908   HGBUR NEGATIVE 04/14/2020 1908   BILIRUBINUR negative 06/22/2020 1150   KETONESUR negative 06/22/2020 1150   KETONESUR NEGATIVE 04/14/2020 1908   PROTEINUR 30 (A) 04/14/2020 1908   UROBILINOGEN 0.2 06/22/2020 1150   NITRITE Negative 06/22/2020 1150   NITRITE NEGATIVE 04/14/2020 1908   LEUKOCYTESUR Negative 06/22/2020 1150   LEUKOCYTESUR NEGATIVE 04/14/2020 1908   Sepsis Labs: @LABRCNTIP (procalcitonin:4,lacticidven:4)  ) Recent Results (from the past 240 hour(s))  Resp Panel by RT-PCR (Flu A&B, Covid) Nasopharyngeal Swab     Status: None   Collection Time: 08/11/20 10:40 PM   Specimen: Nasopharyngeal Swab; Nasopharyngeal(NP) swabs in vial transport medium  Result Value Ref Range Status   SARS Coronavirus 2 by RT PCR NEGATIVE NEGATIVE Final    Comment: (NOTE) SARS-CoV-2 target nucleic acids are NOT DETECTED.  The SARS-CoV-2 RNA is generally detectable in upper respiratory specimens during the acute phase of infection. The lowest concentration of SARS-CoV-2 viral copies this assay can detect is 138 copies/mL. A negative result does not preclude SARS-Cov-2 infection and should not be used as the sole basis for treatment or other patient management decisions. A negative result may occur with  improper specimen collection/handling, submission of specimen other than nasopharyngeal swab, presence of viral mutation(s) within the areas targeted by this assay, and inadequate number of viral copies(<138 copies/mL). A negative result must be combined with clinical observations, patient history, and epidemiological information. The expected result is Negative.  Fact Sheet for Patients:   08/13/20  Fact Sheet for Healthcare Providers:  BloggerCourse.com  This test is no t yet approved or cleared by the [employer] FDA and  has been authorized for detection and/or diagnosis of SARS-CoV-2 by FDA under an Emergency Use Authorization (EUA). This EUA will remain  in effect (meaning this test can be used) for the duration of the COVID-19 declaration under Section 564(b)(1) of the Act, 21 U.S.C.section 360bbb-3(b)(1), unless the authorization is terminated  or revoked sooner.       Influenza A by PCR NEGATIVE NEGATIVE Final   Influenza B by PCR NEGATIVE NEGATIVE Final    Comment: (NOTE) The Xpert Xpress SARS-CoV-2/FLU/RSV plus assay is intended as an aid in the diagnosis of influenza from Nasopharyngeal swab specimens and should not be used as a sole basis for treatment. Nasal washings and aspirates are unacceptable for Xpert Xpress SARS-CoV-2/FLU/RSV testing.  Fact Sheet for Patients: [place]  Fact Sheet for Healthcare Providers: BloggerCourse.com  This test is not yet approved or cleared by the [employer] and has been authorized for  detection and/or diagnosis of SARS-CoV-2 by FDA under an Emergency Use Authorization (EUA). This EUA will remain in effect (meaning this test can be used) for the duration of the COVID-19 declaration under Section 564(b)(1) of the Act, 21 U.S.C. section 360bbb-3(b)(1), unless the authorization is terminated or revoked.  Performed at [facility], [address], [place], [place] [address]          Radiology Studies: DG Chest 2 View  Result Date: 08/11/2020 CLINICAL DATA:  Chest pain EXAM: CHEST - 2 VIEW COMPARISON:  04/08/2020 FINDINGS: Cardiac and mediastinal contours normal. Pulmonary vascularity normal. Mild scarring in the left lung base unchanged. No acute infiltrate or effusion. Spinal  cord stimulator unchanged. IMPRESSION: No active cardiopulmonary disease. Electronically Signed   By: Marlan Palau M.D.   On: 08/11/2020 14:51   ECHOCARDIOGRAM COMPLETE  Result Date: 08/13/2020    ECHOCARDIOGRAM REPORT   Patient Name:   Brandon [name] Date of Exam: 08/13/2020 Medical Rec #:  [id number]      Height:       69.0 in Accession #:    [id number]     Weight:       212.2 lb Date of Birth:  21-Jun-1956      BSA:          2.119 m Patient Age:    64 years       BP:           154/79 mmHg Patient Gender: M              HR:           92 bpm. Exam Location:  Inpatient Procedure: 2D Echo Indications:    chest pain  History:        Patient has no prior history of Echocardiogram examinations.                 Signs/Symptoms:Chest Pain; Risk Factors:Hypertension,                 Dyslipidemia and Diabetes.  Sonographer:    Delcie Roch Referring Phys: [id number] PING T ZHANG IMPRESSIONS  1. Left ventricular ejection fraction, by estimation, is 60 to 65%. The left ventricle has normal function. The left ventricle has no regional wall motion abnormalities. There is mild left ventricular hypertrophy. Left ventricular diastolic parameters are consistent with Grade I diastolic dysfunction (impaired relaxation).  2. Right ventricular systolic function is normal. The right ventricular size is normal.  3. The mitral valve is normal in structure. No evidence of mitral valve regurgitation. No evidence of mitral stenosis.  4. The aortic valve is tricuspid. Aortic valve regurgitation is not visualized. No aortic stenosis is present.  5. The inferior vena cava is normal in size with greater than 50% respiratory variability, suggesting right atrial pressure of 3 mmHg. FINDINGS  Left Ventricle: Left ventricular ejection fraction, by estimation, is 60 to 65%. The left ventricle has normal function. The left ventricle has no regional wall motion abnormalities. The left ventricular internal cavity size was normal in size. There is   mild left ventricular hypertrophy. Left ventricular diastolic parameters are consistent with Grade I diastolic dysfunction (impaired relaxation). Normal left ventricular filling pressure. Right Ventricle: The right ventricular size is normal. No increase in right ventricular wall thickness. Right ventricular systolic function is normal. Left Atrium: Left atrial size was normal in size. Right Atrium: Right atrial size was normal in size. Pericardium: There is no evidence of pericardial effusion. Mitral  Valve: The mitral valve is normal in structure. No evidence of mitral valve regurgitation. No evidence of mitral valve stenosis. Tricuspid Valve: The tricuspid valve is normal in structure. Tricuspid valve regurgitation is not demonstrated. No evidence of tricuspid stenosis. Aortic Valve: The aortic valve is tricuspid. Aortic valve regurgitation is not visualized. No aortic stenosis is present. Pulmonic Valve: The pulmonic valve was not well visualized. Pulmonic valve regurgitation is not visualized. No evidence of pulmonic stenosis. Aorta: The aortic root is normal in size and structure. Venous: The inferior vena cava is normal in size with greater than 50% respiratory variability, suggesting right atrial pressure of 3 mmHg. IAS/Shunts: No atrial level shunt detected by color flow Doppler.  LEFT VENTRICLE PLAX 2D LVIDd:         4.70 cm  Diastology LVIDs:         3.40 cm  LV e' medial:    6.20 cm/s LV PW:         1.10 cm  LV E/e' medial:  11.1 LV IVS:        0.90 cm  LV e' lateral:   9.46 cm/s LVOT diam:     2.20 cm  LV E/e' lateral: 7.3 LV SV:         65 LV SV Index:   31 LVOT Area:     3.80 cm  RIGHT VENTRICLE             IVC RV S prime:     12.80 cm/s  IVC diam: 1.60 cm TAPSE (M-mode): 1.7 cm LEFT ATRIUM             Index       RIGHT ATRIUM           Index LA diam:        4.00 cm 1.89 cm/m  RA Area:     12.50 cm LA Vol (A2C):   34.6 ml 16.33 ml/m RA Volume:   26.20 ml  12.37 ml/m LA Vol (A4C):   34.3 ml 16.19  ml/m LA Biplane Vol: 35.3 ml 16.66 ml/m  AORTIC VALVE LVOT Vmax:   107.41 cm/s LVOT Vmean:  69.396 cm/s LVOT VTI:    0.171 m  AORTA Ao Root diam: 3.20 cm Ao Asc diam:  3.20 cm MITRAL VALVE MV Area (PHT): 4.71 cm     SHUNTS MV Decel Time: 161 msec     Systemic VTI:  0.17 m MV E velocity: 69.00 cm/s   Systemic Diam: 2.20 cm MV A velocity: 105.00 cm/s MV E/A ratio:  0.66 Dina RichJonathan Branch MD Electronically signed by Dina RichJonathan Branch MD Signature Date/Time: 08/13/2020/11:35:37 AM    Final    CT Angio Chest/Abd/Pel for Dissection W and/or Wo Contrast  Result Date: 08/11/2020 CLINICAL DATA:  Abdominal pain, mid sternal chest pain with exertion for 3 days. Known today pain is constant. Hypertensive. Of aortic dissection suspected. EXAM: CT ANGIOGRAPHY CHEST, ABDOMEN AND PELVIS TECHNIQUE: Non-contrast CT of the chest was initially obtained. Multidetector CT imaging through the chest, abdomen and pelvis was performed using the standard protocol during bolus administration of intravenous contrast. Multiplanar reconstructed images and MIPs were obtained and reviewed to evaluate the vascular anatomy. CONTRAST:  100mL OMNIPAQUE IOHEXOL 350 MG/ML SOLN COMPARISON:  CT abdomen pelvis 04/14/2020. FINDINGS: VASCULAR Aorta: Mild atherosclerotic plaque. Normal caliber aorta without aneurysm, dissection, vasculitis or significant stenosis. Celiac: Patent without evidence of aneurysm, dissection, vasculitis or significant stenosis. SMA: Patent without evidence of aneurysm, dissection, vasculitis or significant stenosis. Renals:  Both renal arteries are patent without evidence of aneurysm, dissection, vasculitis, fibromuscular dysplasia or significant stenosis. IMA: Patent without evidence of aneurysm, dissection, vasculitis or significant stenosis. Inflow: Mild atherosclerotic plaque. Patent without evidence of aneurysm, dissection, vasculitis or significant stenosis. Veins: No obvious venous abnormality within the limitations of  this arterial phase study. Review of the MIP images confirms the above findings. NON-VASCULAR Cardiovascular: Preferential opacification of the thoracic aorta. Normal heart size. No pericardial effusion. Mild left anterior descending coronary artery calcifications. Mediastinum/Nodes: Pre-vascular lymph node calcification. No enlarged mediastinal, hilar, or axillary lymph nodes. Thyroid gland, trachea, and esophagus demonstrate no significant findings. The main pulmonary artery is normal in caliber. No central pulmonary embolus. Lungs/Pleura: Left posterior pleura calcification. Calcified granuloma at the left base. No pulmonary mass. No focal consolidation. Left lower lobe scarring. No pleural effusion. No pneumothorax. Musculoskeletal: No chest wall abnormality. No suspicious lytic or blastic osseous lesions. No acute displaced fracture. Multilevel degenerative changes of the spine. Lower back neurostimulator with electrodes terminating in the posterior spinal canal. Hepatobiliary: No focal liver abnormality is seen. Status post cholecystectomy. No biliary dilatation. Pancreas: For No focal lesion. Normal pancreatic contour. No surrounding inflammatory changes. No main pancreatic ductal dilatation. Spleen: Couple of punctate calcifications within the splenic parenchyma likely represent sequelae of prior granulomatous disease. normal in size without focal abnormality. Adrenals/Urinary Tract: No adrenal nodule bilaterally. Bilateral kidneys enhance symmetrically. Similar-appearing 2.7 cm fluid density lesion within the right kidney that likely represents a simple renal cyst. No hydronephrosis. No hydroureter. The urinary bladder is distended with urine and grossly unremarkable. Stomach/Bowel: Stomach is within normal limits. No evidence of bowel wall thickening or dilatation. Scattered colonic diverticulosis appendix appears normal. Lymphatic: No abdominal or pelvic lymphadenopathy. Reproductive: Penile prosthesis is  noted with a reservoir within the left anterior lower abdomen. The prostate is enlarged measuring up to 5.6 cm. Other: No intraperitoneal free fluid. No intraperitoneal free gas. No organized fluid collection. Musculoskeletal: No abdominal wall hernia or abnormality. L4-L5 laminectomy and posterolateral fusion. No suspicious lytic or blastic osseous lesions. No acute displaced fracture. Multilevel degenerative changes of the spine. Review of the MIP images confirms the above findings. IMPRESSION: 1. No aortic aneurysm or dissection. Aortic Atherosclerosis (ICD10-I70.0) - mild. 2. No acute intrathoracic, intra-abdominal, or intrapelvic abnormality in a patient with prior granulomatous disease. 3. Prostatomegaly. 4. Scattered colonic diverticulosis with no acute diverticulitis. Electronically Signed   By: Tish Frederickson M.D.   On: 08/11/2020 21:40        Scheduled Meds: . amLODipine  10 mg Oral Daily  . ARIPiprazole  10 mg Oral Daily  . aspirin  81 mg Oral Daily  . buPROPion  450 mg Oral Daily  . empagliflozin  25 mg Oral Daily  . enoxaparin (LOVENOX) injection  40 mg Subcutaneous Q24H  . insulin aspart  0-15 Units Subcutaneous TID WC  . insulin glargine  60 Units Subcutaneous Daily  . lidocaine  1 patch Transdermal Q24H  . metoprolol tartrate  100 mg Oral Once  . metoprolol tartrate  25 mg Oral BID  . pantoprazole  40 mg Oral Daily  . pioglitazone  30 mg Oral Daily  . pravastatin  40 mg Oral QHS  . tamsulosin  0.4 mg Oral Daily   Continuous Infusions:   LOS: 0 days    Time spent:  Zannie Cove, MD [facility]  08/13/2020, 2:01 PM

## 2020-08-13 NOTE — Progress Notes (Signed)
  Echocardiogram 2D Echocardiogram has been performed.  Delcie Roch 08/13/2020, 11:31 AM

## 2020-08-14 ENCOUNTER — Inpatient Hospital Stay (HOSPITAL_COMMUNITY): Payer: No Typology Code available for payment source

## 2020-08-14 DIAGNOSIS — I208 Other forms of angina pectoris: Secondary | ICD-10-CM

## 2020-08-14 DIAGNOSIS — I2510 Atherosclerotic heart disease of native coronary artery without angina pectoris: Secondary | ICD-10-CM

## 2020-08-14 DIAGNOSIS — E11649 Type 2 diabetes mellitus with hypoglycemia without coma: Secondary | ICD-10-CM

## 2020-08-14 LAB — GLUCOSE, CAPILLARY
Glucose-Capillary: 128 mg/dL — ABNORMAL HIGH (ref 70–99)
Glucose-Capillary: 109 mg/dL — ABNORMAL HIGH (ref 70–99)
Glucose-Capillary: 99 mg/dL (ref 70–99)
Glucose-Capillary: 112 mg/dL — ABNORMAL HIGH (ref 70–99)

## 2020-08-14 MED ORDER — ZOLPIDEM TARTRATE 5 MG PO TABS
5.0000 mg | ORAL_TABLET | Freq: Every evening | ORAL | Status: DC | PRN
  Administered 2020-08-14 – 2020-08-15 (×2): 5 mg via ORAL
  Filled 2020-08-14 (×2): qty 1

## 2020-08-14 MED ORDER — SODIUM CHLORIDE 0.9% FLUSH
3.0000 mL | Freq: Two times a day (BID) | INTRAVENOUS | Status: DC
  Administered 2020-08-14 – 2020-08-15 (×2): 3 mL via INTRAVENOUS

## 2020-08-14 MED ORDER — METOPROLOL TARTRATE 5 MG/5ML IV SOLN
INTRAVENOUS | Status: AC
  Filled 2020-08-14: qty 5

## 2020-08-14 MED ORDER — MORPHINE SULFATE (PF) 2 MG/ML IV SOLN
1.0000 mg | Freq: Once | INTRAVENOUS | Status: AC
  Administered 2020-08-14: 1 mg via INTRAVENOUS
  Filled 2020-08-14: qty 1

## 2020-08-14 MED ORDER — IOHEXOL 350 MG/ML SOLN
80.0000 mL | Freq: Once | INTRAVENOUS | Status: AC | PRN
  Administered 2020-08-14: 80 mL via INTRAVENOUS

## 2020-08-14 MED ORDER — NITROGLYCERIN 0.4 MG SL SUBL
SUBLINGUAL_TABLET | SUBLINGUAL | Status: AC
  Administered 2020-08-14: 0.8 mg via SUBLINGUAL
  Filled 2020-08-14: qty 2

## 2020-08-14 MED ORDER — ALPRAZOLAM 0.5 MG PO TABS
0.5000 mg | ORAL_TABLET | Freq: Two times a day (BID) | ORAL | Status: DC | PRN
  Administered 2020-08-14 – 2020-08-15 (×3): 0.5 mg via ORAL
  Filled 2020-08-14 (×3): qty 1

## 2020-08-14 MED FILL — JARDIANCE 25 MG TABLET: 25 | 90 days supply | Qty: 90 | Fill #0

## 2020-08-14 NOTE — Progress Notes (Signed)
    Cor CTA results reviewed in detail w/ patient.  I also called his wife @ his request to discuss results and plan for diagnostic catheterization.  All questions answered.  The patient understands that risks include but are not limited to stroke (1 in 1000), death (1 in 1000), kidney failure [usually temporary] (1 in 500), bleeding (1 in 200), allergic reaction [possibly serious] (1 in 200), and agrees to proceed.  He is scheduled for Tues 12/21 @ 13:30 w/ Dr. Herbie Baltimore.   Nicolasa Ducking, NP

## 2020-08-14 NOTE — Progress Notes (Signed)
Patient complaining of chronic back pain, requesting PRN Norco.  Patient's current diet order is NPO.  RN text paged [facility] on call and order received.

## 2020-08-14 NOTE — Progress Notes (Signed)
Progress Note  Patient Name: Brandon [name] Date of Encounter: 08/14/2020  [facility] Cardiologist: New patient/Dr Shari Prows  Subjective   No chest pain overnight.  Inpatient Medications    Scheduled Meds: . amLODipine  10 mg Oral Daily  . ARIPiprazole  10 mg Oral Daily  . aspirin  81 mg Oral Daily  . buPROPion  450 mg Oral Daily  . empagliflozin  25 mg Oral Daily  . enoxaparin (LOVENOX) injection  40 mg Subcutaneous Q24H  . insulin aspart  0-15 Units Subcutaneous TID WC  . insulin glargine  60 Units Subcutaneous Daily  . lidocaine  1 patch Transdermal Q24H  . metoprolol tartrate  100 mg Oral Once  . metoprolol tartrate  25 mg Oral BID  . pantoprazole  40 mg Oral Daily  . pioglitazone  30 mg Oral Daily  . pravastatin  40 mg Oral QHS  . tamsulosin  0.4 mg Oral Daily   Continuous Infusions:  PRN Meds: acetaminophen, labetalol, metoCLOPramide, nitroGLYCERIN, ondansetron (ZOFRAN) IV, ondansetron, tiZANidine, traMADol   Vital Signs    Vitals:   08/13/20 1713 08/13/20 2053 08/14/20 0339 08/14/20 0342  BP: (!) 144/72 126/71 119/75   Pulse: 96 74 72   Resp: 16 16 16    Temp:  98 F (36.7 C) 97.8 F (36.6 C)   TempSrc:  Oral Oral   SpO2: 98% 96% 98%   Weight:    97.4 kg  Height:        Intake/Output Summary (Last 24 hours) at 08/14/2020 0819 Last data filed at 08/13/2020 2141 Gross per 24 hour  Intake 240 ml  Output --  Net 240 ml   Last 3 Weights 08/14/2020 08/13/2020 08/12/2020  Weight (lbs) 214 lb 12.8 oz 212 lb 3.2 oz 215 lb  Weight (kg) 97.433 kg 96.253 kg 97.523 kg      Telemetry    SR, 70' - Personally Reviewed  ECG    No new tracing - Personally Reviewed  Physical Exam   GEN: No acute distress.   Neck: No JVD Cardiac: RRR, no murmurs, rubs, or gallops.  Respiratory: Clear to auscultation bilaterally. GI: Soft, nontender, non-distended  MS: No edema; No deformity. Neuro:  Nonfocal  Psych: Normal affect   Labs    High Sensitivity  Troponin:   Recent Labs  Lab 08/11/20 1415 08/11/20 1623  TROPONINIHS 4 5      Chemistry Recent Labs  Lab 08/11/20 1415 08/12/20 0033  NA 136  --   K 3.2*  --   CL 101  --   CO2 22  --   GLUCOSE 217*  --   BUN 7*  --   CREATININE 0.86  --   CALCIUM 8.9  --   PROT  --  7.0  ALBUMIN  --  3.8  AST  --  24  ALT  --  14  ALKPHOS  --  65  BILITOT  --  0.9  GFRNONAA >60  --   ANIONGAP 13  --     Hematology Recent Labs  Lab 08/11/20 1415  WBC 6.2  RBC 5.52  HGB 15.8  HCT 47.6  MCV 86.2  MCH 28.6  MCHC 33.2  RDW 16.6*  PLT 232   BNPNo results for input(s): BNP, PROBNP in the last 168 hours.   DDimer  Recent Labs  Lab 08/11/20 1623  DDIMER 0.31     Cardiac Studies   TTE: 08/14/2020 1. Left ventricular ejection fraction, by estimation, is 60  to 65%. The  left ventricle has normal function. The left ventricle has no regional  wall motion abnormalities. There is mild left ventricular hypertrophy.  Left ventricular diastolic parameters  are consistent with Grade I diastolic dysfunction (impaired relaxation).  2. Right ventricular systolic function is normal. The right ventricular  size is normal.  3. The mitral valve is normal in structure. No evidence of mitral valve  regurgitation. No evidence of mitral stenosis.  4. The aortic valve is tricuspid. Aortic valve regurgitation is not  visualized. No aortic stenosis is present.  5. The inferior vena cava is normal in size with greater than 50%  respiratory variability, suggesting right atrial pressure of 3 mmHg.    Patient Profile     64 y.o. male with a hx of HTN,DM, HLD, diabetic gastroparesis and anxiety  who is being seen today for the evaluation of chest pain at the request of Dr. Jomarie Longs.   Assessment & Plan    -Check coronary CTA is pending, HR in 70', we will give metoprolol 100 mg po x 1 now, arrange CCTA at 10 am as the patient is NPO and diabetic -Continue ASA and statin -started on  metoprolol yesterday -Continue amlodipine -Will need aggressive medical management of underlying DMII as A1C 10.1 -If CTA without significant obstructive disease, can discharge home with cardiology follow-up, we will follow  For questions or updates, please contact [facility] Please consult [email or web address] for contact info under    Signed, Tobias Alexander, MD  08/14/2020, 8:19 AM

## 2020-08-14 NOTE — Progress Notes (Signed)
PROGRESS NOTE    NYSIR FERGUSSON  HQI:[id number] DOB: March 27, 1956 DOA: 08/11/2020 PCP: Everrett Coombe, DO  Brief Narrative: 64 year old male with history of type 2 diabetes mellitus on insulin, hypertension, dyslipidemia, chronic back pain, gastroparesis, anxiety and depression presented to the ED with chest pain off and on since Wednesday 12/15. -Pressure-like, worsened with exertion -Work-up in the ED noted negative troponins, nonischemic EKG   Assessment & Plan:   Atypical chest pain with some typical features -EKG unremarkable, high-sensitivity troponins are negative -D-dimer and CTA chest negative for PE -Given multiple cardiac risk factors cardiology evaluation requested, coronary CTA today  Hypertension -Continue amlodipine  Uncontrolled type 2 diabetes mellitus -Hemoglobin A1c is 10.1 -Continue Lantus, Actos, Jardiance -CBG stable here, sliding scale insulin  Anxiety Depression -Continue SSRI, Abilify, Wellbutrin  Chronic back pain -Continue home regimen of tramadol/Zanaflex  DVT prophylaxis: Lovenox Code Status: Full code Family Communication: No family at bedside Disposition Plan:  Status is: Inpatient pending cardiac work-up  Dispo: The patient is from: Home              Anticipated d/c is to: Home              Anticipated d/c date is: Home later today if coronary CTA is unremarkable              Patient currently is not medically stable to d/c.  Consultants:  Cardiology  Procedures:   Antimicrobials:    Subjective: -Feels okay today, mild dizziness earlier  Objective: Vitals:   08/13/20 2053 08/14/20 0339 08/14/20 0342 08/14/20 1158  BP: 126/71 119/75  93/63  Pulse: 74 72  70  Resp: 16 16  18   Temp: 98 F (36.7 C) 97.8 F (36.6 C)  (!) 97.3 F (36.3 C)  TempSrc: Oral Oral  Axillary  SpO2: 96% 98%  99%  Weight:   97.4 kg   Height:        Intake/Output Summary (Last 24 hours) at 08/14/2020 1354 Last data filed at 08/13/2020 2141 Gross  per 24 hour  Intake 240 ml  Output --  Net 240 ml   Filed Weights   08/12/20 1620 08/13/20 0505 08/14/20 0342  Weight: 97.5 kg 96.3 kg 97.4 kg    Examination:  General exam: Awake alert oriented x3, no distress CVS: S1-S2, regular rate rhythm is Lungs: Clear bilaterally Abdomen: Soft, nontender, bowel sounds present Extremities: No edema  Skin: No rashes on exposed Psychiatry: Judgement and insight appear normal. Mood & affect appropriate.   Data Reviewed:   CBC: Recent Labs  Lab 08/11/20 1415  WBC 6.2  HGB 15.8  HCT 47.6  MCV 86.2  PLT 232   Basic Metabolic Panel: Recent Labs  Lab 08/11/20 1415  NA 136  K 3.2*  CL 101  CO2 22  GLUCOSE 217*  BUN 7*  CREATININE 0.86  CALCIUM 8.9   GFR: Estimated Creatinine Clearance: 99.9 mL/min (by C-G formula based on SCr of 0.86 mg/dL). Liver Function Tests: Recent Labs  Lab 08/12/20 0033  AST 24  ALT 14  ALKPHOS 65  BILITOT 0.9  PROT 7.0  ALBUMIN 3.8   Recent Labs  Lab 08/12/20 0033  LIPASE 38   No results for input(s): AMMONIA in the last 168 hours. Coagulation Profile: No results for input(s): INR, PROTIME in the last 168 hours. Cardiac Enzymes: No results for input(s): CKTOTAL, CKMB, CKMBINDEX, TROPONINI in the last 168 hours. BNP (last 3 results) No results for input(s): PROBNP in the last  8760 hours. HbA1C: Recent Labs    08/13/20 0119  HGBA1C 10.1*   CBG: Recent Labs  Lab 08/13/20 1151 08/13/20 1545 08/13/20 2213 08/14/20 0748 08/14/20 1048  GLUCAP 111* 111* 140* 128* 109*   Lipid Profile: Recent Labs    08/13/20 0119  CHOL 160  HDL 32*  LDLCALC 76  TRIG 161258*  CHOLHDL 5.0   Thyroid Function Tests: Recent Labs    08/13/20 0119  TSH 2.345   Anemia Panel: No results for input(s): VITAMINB12, FOLATE, FERRITIN, TIBC, IRON, RETICCTPCT in the last 72 hours. Urine analysis:    Component Value Date/Time   COLORURINE YELLOW 04/14/2020 1908   APPEARANCEUR CLEAR 04/14/2020 1908    LABSPEC 1.014 04/14/2020 1908   PHURINE 5.0 04/14/2020 1908   GLUCOSEU 50 (A) 04/14/2020 1908   HGBUR NEGATIVE 04/14/2020 1908   BILIRUBINUR negative 06/22/2020 1150   KETONESUR negative 06/22/2020 1150   KETONESUR NEGATIVE 04/14/2020 1908   PROTEINUR 30 (A) 04/14/2020 1908   UROBILINOGEN 0.2 06/22/2020 1150   NITRITE Negative 06/22/2020 1150   NITRITE NEGATIVE 04/14/2020 1908   LEUKOCYTESUR Negative 06/22/2020 1150   LEUKOCYTESUR NEGATIVE 04/14/2020 1908   Sepsis Labs: @LABRCNTIP (procalcitonin:4,lacticidven:4)  ) Recent Results (from the past 240 hour(s))  Resp Panel by RT-PCR (Flu A&B, Covid) Nasopharyngeal Swab     Status: None   Collection Time: 08/11/20 10:40 PM   Specimen: Nasopharyngeal Swab; Nasopharyngeal(NP) swabs in vial transport medium  Result Value Ref Range Status   SARS Coronavirus 2 by RT PCR NEGATIVE NEGATIVE Final    Comment: (NOTE) SARS-CoV-2 target nucleic acids are NOT DETECTED.  The SARS-CoV-2 RNA is generally detectable in upper respiratory specimens during the acute phase of infection. The lowest concentration of SARS-CoV-2 viral copies this assay can detect is 138 copies/mL. A negative result does not preclude SARS-Cov-2 infection and should not be used as the sole basis for treatment or other patient management decisions. A negative result may occur with  improper specimen collection/handling, submission of specimen other than nasopharyngeal swab, presence of viral mutation(s) within the areas targeted by this assay, and inadequate number of viral copies(<138 copies/mL). A negative result must be combined with clinical observations, patient history, and epidemiological information. The expected result is Negative.  Fact Sheet for Patients:  BloggerCourse.comhttps://www.fda.gov/media/152166/download  Fact Sheet for Healthcare Providers:  SeriousBroker.ithttps://www.fda.gov/media/152162/download  This test is no t yet approved or cleared by the [place] FDA and  has  been authorized for detection and/or diagnosis of SARS-CoV-2 by FDA under an Emergency Use Authorization (EUA). This EUA will remain  in effect (meaning this test can be used) for the duration of the COVID-19 declaration under Section 564(b)(1) of the Act, 21 U.S.C.section 360bbb-3(b)(1), unless the authorization is terminated  or revoked sooner.       Influenza A by PCR NEGATIVE NEGATIVE Final   Influenza B by PCR NEGATIVE NEGATIVE Final    Comment: (NOTE) The Xpert Xpress SARS-CoV-2/FLU/RSV plus assay is intended as an aid in the diagnosis of influenza from Nasopharyngeal swab specimens and should not be used as a sole basis for treatment. Nasal washings and aspirates are unacceptable for Xpert Xpress SARS-CoV-2/FLU/RSV testing.  Fact Sheet for Patients: BloggerCourse.comhttps://www.fda.gov/media/152166/download  Fact Sheet for Healthcare Providers: SeriousBroker.ithttps://www.fda.gov/media/152162/download  This test is not yet approved or cleared by the [place] FDA and has been authorized for detection and/or diagnosis of SARS-CoV-2 by FDA under an Emergency Use Authorization (EUA). This EUA will remain in effect (meaning this test can be used) for the  duration of the COVID-19 declaration under Section 564(b)(1) of the Act, 21 U.S.C. section 360bbb-3(b)(1), unless the authorization is terminated or revoked.  Performed at [facility], [address], [facility]         Radiology Studies: CT CORONARY [facility] (2-Rh) W/CTA COR W/SCORE W/CA W/CM &/OR WO/CM  Result Date: 08/14/2020 EXAM: OVER-READ INTERPRETATION  CT CHEST The following report is an over-read performed by radiologist Dr. Genevive Bi of [facility] on 08/14/2020. This over-read does not include interpretation of cardiac or coronary anatomy or pathology. The coronary CTA interpretation by the cardiologist is attached. COMPARISON:  CT a 08/11/2020 FINDINGS: Limited view of the lung parenchyma  demonstrates mild LEFT basilar atelectasis pleural thickening with calcifications. Airways are normal. Limited view of the mediastinum demonstrates no adenopathy. Esophagus normal. Limited view of the upper abdomen unremarkable. Limited view of the skeleton and chest wall is unremarkable. IMPRESSION: Pleural thickening and calcifications at the LEFT lung base. No change from prior Electronically Signed   By: Genevive Bi M.D.   On: 08/14/2020 12:05   ECHOCARDIOGRAM COMPLETE  Result Date: 08/13/2020    ECHOCARDIOGRAM REPORT   Patient Name:   Brandon [name] Date of Exam: 08/13/2020 Medical Rec #:  [id number]      Height:       69.0 in Accession #:    [id number]     Weight:       212.2 lb Date of Birth:  Apr 13, 1956      BSA:          2.119 m Patient Age:    64 years       BP:           154/79 mmHg Patient Gender: M              HR:           92 bpm. Exam Location:  Inpatient Procedure: 2D Echo Indications:    chest pain  History:        Patient has no prior history of Echocardiogram examinations.                 Signs/Symptoms:Chest Pain; Risk Factors:Hypertension,                 Dyslipidemia and Diabetes.  Sonographer:    Delcie Roch Referring Phys: [id number] PING T ZHANG IMPRESSIONS  1. Left ventricular ejection fraction, by estimation, is 60 to 65%. The left ventricle has normal function. The left ventricle has no regional wall motion abnormalities. There is mild left ventricular hypertrophy. Left ventricular diastolic parameters are consistent with Grade I diastolic dysfunction (impaired relaxation).  2. Right ventricular systolic function is normal. The right ventricular size is normal.  3. The mitral valve is normal in structure. No evidence of mitral valve regurgitation. No evidence of mitral stenosis.  4. The aortic valve is tricuspid. Aortic valve regurgitation is not visualized. No aortic stenosis is present.  5. The inferior vena cava is normal in size with greater than 50% respiratory  variability, suggesting right atrial pressure of 3 mmHg. FINDINGS  Left Ventricle: Left ventricular ejection fraction, by estimation, is 60 to 65%. The left ventricle has normal function. The left ventricle has no regional wall motion abnormalities. The left ventricular internal cavity size was normal in size. There is  mild left ventricular hypertrophy. Left ventricular diastolic parameters are consistent with Grade I diastolic dysfunction (impaired relaxation). Normal left ventricular filling pressure. Right Ventricle:  The right ventricular size is normal. No increase in right ventricular wall thickness. Right ventricular systolic function is normal. Left Atrium: Left atrial size was normal in size. Right Atrium: Right atrial size was normal in size. Pericardium: There is no evidence of pericardial effusion. Mitral Valve: The mitral valve is normal in structure. No evidence of mitral valve regurgitation. No evidence of mitral valve stenosis. Tricuspid Valve: The tricuspid valve is normal in structure. Tricuspid valve regurgitation is not demonstrated. No evidence of tricuspid stenosis. Aortic Valve: The aortic valve is tricuspid. Aortic valve regurgitation is not visualized. No aortic stenosis is present. Pulmonic Valve: The pulmonic valve was not well visualized. Pulmonic valve regurgitation is not visualized. No evidence of pulmonic stenosis. Aorta: The aortic root is normal in size and structure. Venous: The inferior vena cava is normal in size with greater than 50% respiratory variability, suggesting right atrial pressure of 3 mmHg. IAS/Shunts: No atrial level shunt detected by color flow Doppler.  LEFT VENTRICLE PLAX 2D LVIDd:         4.70 cm  Diastology LVIDs:         3.40 cm  LV e' medial:    6.20 cm/s LV PW:         1.10 cm  LV E/e' medial:  11.1 LV IVS:        0.90 cm  LV e' lateral:   9.46 cm/s LVOT diam:     2.20 cm  LV E/e' lateral: 7.3 LV SV:         65 LV SV Index:   31 LVOT Area:     3.80 cm  RIGHT  VENTRICLE             IVC RV S prime:     12.80 cm/s  IVC diam: 1.60 cm TAPSE (M-mode): 1.7 cm LEFT ATRIUM             Index       RIGHT ATRIUM           Index LA diam:        4.00 cm 1.89 cm/m  RA Area:     12.50 cm LA Vol (A2C):   34.6 ml 16.33 ml/m RA Volume:   26.20 ml  12.37 ml/m LA Vol (A4C):   34.3 ml 16.19 ml/m LA Biplane Vol: 35.3 ml 16.66 ml/m  AORTIC VALVE LVOT Vmax:   107.41 cm/s LVOT Vmean:  69.396 cm/s LVOT VTI:    0.171 m  AORTA Ao Root diam: 3.20 cm Ao Asc diam:  3.20 cm MITRAL VALVE MV Area (PHT): 4.71 cm     SHUNTS MV Decel Time: 161 msec     Systemic VTI:  0.17 m MV E velocity: 69.00 cm/s   Systemic Diam: 2.20 cm MV A velocity: 105.00 cm/s MV E/A ratio:  0.66 Dina Rich MD Electronically signed by Dina Rich MD Signature Date/Time: 08/13/2020/11:35:37 AM    Final         Scheduled Meds: . amLODipine  10 mg Oral Daily  . ARIPiprazole  10 mg Oral Daily  . aspirin  81 mg Oral Daily  . buPROPion  450 mg Oral Daily  . empagliflozin  25 mg Oral Daily  . enoxaparin (LOVENOX) injection  40 mg Subcutaneous Q24H  . insulin aspart  0-15 Units Subcutaneous TID WC  . insulin glargine  60 Units Subcutaneous Daily  . lidocaine  1 patch Transdermal Q24H  . metoprolol tartrate  25 mg Oral BID  . pantoprazole  40 mg Oral Daily  . pioglitazone  30 mg Oral Daily  . pravastatin  40 mg Oral QHS  . tamsulosin  0.4 mg Oral Daily   Continuous Infusions:   LOS: 1 day    Time spent:  Zannie Cove, MD [facility]  08/14/2020, 1:54 PM

## 2020-08-15 ENCOUNTER — Encounter (HOSPITAL_COMMUNITY)
Admission: EM | Disposition: A | Discharge status: Home / Self Care | Payer: Self-pay | Source: Home / Self Care | Attending: Internal Medicine

## 2020-08-15 ENCOUNTER — Other Ambulatory Visit: Payer: Self-pay

## 2020-08-15 DIAGNOSIS — E782 Mixed hyperlipidemia: Secondary | ICD-10-CM

## 2020-08-15 DIAGNOSIS — I25110 Atherosclerotic heart disease of native coronary artery with unstable angina pectoris: Secondary | ICD-10-CM

## 2020-08-15 DIAGNOSIS — I2510 Atherosclerotic heart disease of native coronary artery without angina pectoris: Secondary | ICD-10-CM

## 2020-08-15 DIAGNOSIS — I25118 Atherosclerotic heart disease of native coronary artery with other forms of angina pectoris: Secondary | ICD-10-CM

## 2020-08-15 HISTORY — PX: LEFT HEART CATH AND CORONARY ANGIOGRAPHY: CATH118249

## 2020-08-15 HISTORY — PX: CORONARY ULTRASOUND/IVUS: CATH118244

## 2020-08-15 HISTORY — PX: CORONARY STENT INTERVENTION: CATH118234

## 2020-08-15 HISTORY — PX: INTRAVASCULAR ULTRASOUND/IVUS: CATH118244

## 2020-08-15 LAB — GLUCOSE, CAPILLARY
Glucose-Capillary: 110 mg/dL — ABNORMAL HIGH (ref 70–99)
Glucose-Capillary: 124 mg/dL — ABNORMAL HIGH (ref 70–99)
Glucose-Capillary: 84 mg/dL (ref 70–99)
Glucose-Capillary: 90 mg/dL (ref 70–99)
Glucose-Capillary: 91 mg/dL (ref 70–99)
Glucose-Capillary: 79 mg/dL (ref 70–99)
Glucose-Capillary: 207 mg/dL — ABNORMAL HIGH (ref 70–99)

## 2020-08-15 LAB — CBC
HCT: 49.6 % (ref 39.0–52.0)
Hemoglobin: 15.6 g/dL (ref 13.0–17.0)
MCH: 27.8 pg (ref 26.0–34.0)
MCHC: 31.5 g/dL (ref 30.0–36.0)
MCV: 88.4 fL (ref 80.0–100.0)
Platelets: 262 10*3/uL (ref 150–400)
RBC: 5.61 MIL/uL (ref 4.22–5.81)
RDW: 16.7 % — ABNORMAL HIGH (ref 11.5–15.5)
WBC: 7.8 10*3/uL (ref 4.0–10.5)
nRBC: 0 % (ref 0.0–0.2)

## 2020-08-15 LAB — BASIC METABOLIC PANEL
Anion gap: 11 (ref 5–15)
BUN: 14 mg/dL (ref 8–23)
CO2: 27 mmol/L (ref 22–32)
Calcium: 9 mg/dL (ref 8.9–10.3)
Chloride: 101 mmol/L (ref 98–111)
Creatinine, Ser: 0.88 mg/dL (ref 0.61–1.24)
GFR, Estimated: >60 mL/min (ref >60)
Glucose, Bld: 78 mg/dL (ref 70–99)
Potassium: 3.7 mmol/L (ref 3.5–5.1)
Sodium: 139 mmol/L (ref 135–145)

## 2020-08-15 LAB — POCT ACTIVATED CLOTTING TIME
Activated Clotting Time: 339 seconds
Activated Clotting Time: 410 seconds

## 2020-08-15 SURGERY — LEFT HEART CATH AND CORONARY ANGIOGRAPHY
Anesthesia: LOCAL

## 2020-08-15 MED ORDER — ASPIRIN 81 MG PO CHEW
81.0000 mg | CHEWABLE_TABLET | Freq: Every day | ORAL | Status: DC
  Administered 2020-08-16: 81 mg via ORAL
  Filled 2020-08-15: qty 1

## 2020-08-15 MED ORDER — MIDAZOLAM HCL 2 MG/2ML IJ SOLN
INTRAMUSCULAR | Status: AC
  Filled 2020-08-15: qty 2

## 2020-08-15 MED ORDER — ONDANSETRON HCL 4 MG/2ML IJ SOLN
4.0000 mg | Freq: Four times a day (QID) | INTRAMUSCULAR | Status: DC | PRN
  Filled 2020-08-15: qty 2

## 2020-08-15 MED ORDER — MIDAZOLAM HCL 2 MG/2ML IJ SOLN
INTRAMUSCULAR | Status: DC | PRN
  Administered 2020-08-15: 2 mg via INTRAVENOUS
  Administered 2020-08-15: 1 mg via INTRAVENOUS

## 2020-08-15 MED ORDER — VERAPAMIL HCL 2.5 MG/ML IV SOLN
INTRAVENOUS | Status: DC | PRN
  Administered 2020-08-15: 10 mL via INTRA_ARTERIAL

## 2020-08-15 MED ORDER — CLOPIDOGREL BISULFATE 75 MG PO TABS
75.0000 mg | ORAL_TABLET | Freq: Every day | ORAL | Status: DC
  Administered 2020-08-16: 75 mg via ORAL
  Filled 2020-08-15 (×2): qty 1

## 2020-08-15 MED ORDER — CLOPIDOGREL BISULFATE 300 MG PO TABS
ORAL_TABLET | ORAL | Status: DC | PRN
  Administered 2020-08-15: 600 mg via ORAL

## 2020-08-15 MED ORDER — SODIUM CHLORIDE 0.9 % IV SOLN: 250.0000 mL | INTRAVENOUS | Status: DC | PRN

## 2020-08-15 MED ORDER — LABETALOL HCL 5 MG/ML IV SOLN: 10.0000 mg | INTRAVENOUS | Status: AC | PRN

## 2020-08-15 MED ORDER — FENTANYL CITRATE (PF) 100 MCG/2ML IJ SOLN
INTRAMUSCULAR | Status: DC | PRN
  Administered 2020-08-15 (×2): 25 ug via INTRAVENOUS
  Administered 2020-08-15: 50 ug via INTRAVENOUS

## 2020-08-15 MED ORDER — MORPHINE SULFATE (PF) 2 MG/ML IV SOLN: 2.0000 mg | INTRAVENOUS | Status: DC | PRN

## 2020-08-15 MED ORDER — SODIUM CHLORIDE 0.9 % WEIGHT BASED INFUSION: 1.0000 mL/kg/h | INTRAVENOUS | Status: DC

## 2020-08-15 MED ORDER — ACETAMINOPHEN 325 MG PO TABS: 650.0000 mg | ORAL_TABLET | ORAL | Status: DC | PRN

## 2020-08-15 MED ORDER — NITROGLYCERIN 1 MG/10 ML FOR IR/CATH LAB
INTRA_ARTERIAL | Status: DC | PRN
  Administered 2020-08-15: 200 ug via INTRACORONARY
  Administered 2020-08-15: 200 ug via INTRA_ARTERIAL
  Administered 2020-08-15: 200 ug via INTRACORONARY

## 2020-08-15 MED ORDER — HEPARIN (PORCINE) IN NACL 1000-0.9 UT/500ML-% IV SOLN
INTRAVENOUS | Status: AC
  Filled 2020-08-15: qty 1000

## 2020-08-15 MED ORDER — DEXTROSE 50 % IV SOLN
25.0000 mL | Freq: Once | INTRAVENOUS | Status: AC
  Administered 2020-08-15: 25 mL via INTRAVENOUS
  Filled 2020-08-15: qty 50

## 2020-08-15 MED ORDER — NITROGLYCERIN IN D5W 200-5 MCG/ML-% IV SOLN: 0.0000 ug/min | INTRAVENOUS | Status: DC

## 2020-08-15 MED ORDER — LIDOCAINE HCL (PF) 1 % IJ SOLN
INTRAMUSCULAR | Status: DC | PRN
  Administered 2020-08-15: 2 mL

## 2020-08-15 MED ORDER — HEPARIN SODIUM (PORCINE) 1000 UNIT/ML IJ SOLN
INTRAMUSCULAR | Status: DC | PRN
  Administered 2020-08-15: 6000 [IU] via INTRAVENOUS
  Administered 2020-08-15: 5000 [IU] via INTRAVENOUS

## 2020-08-15 MED ORDER — SODIUM CHLORIDE 0.9 % WEIGHT BASED INFUSION
3.0000 mL/kg/h | INTRAVENOUS | Status: DC
  Administered 2020-08-15: 3 mL/kg/h via INTRAVENOUS

## 2020-08-15 MED ORDER — CLOPIDOGREL BISULFATE 300 MG PO TABS
ORAL_TABLET | ORAL | Status: AC
  Filled 2020-08-15: qty 2

## 2020-08-15 MED ORDER — VERAPAMIL HCL 2.5 MG/ML IV SOLN
INTRAVENOUS | Status: AC
  Filled 2020-08-15: qty 2

## 2020-08-15 MED ORDER — INSULIN GLARGINE 100 UNIT/ML ~~LOC~~ SOLN
45.0000 [IU] | Freq: Every day | SUBCUTANEOUS | Status: DC
  Administered 2020-08-15: 45 [IU] via SUBCUTANEOUS
  Filled 2020-08-15 (×3): qty 0.45

## 2020-08-15 MED ORDER — HEPARIN (PORCINE) IN NACL 1000-0.9 UT/500ML-% IV SOLN
INTRAVENOUS | Status: DC | PRN
  Administered 2020-08-15 (×2): 500 mL

## 2020-08-15 MED ORDER — SODIUM CHLORIDE 0.9 % WEIGHT BASED INFUSION: 3.0000 mL/kg/h | INTRAVENOUS | Status: DC

## 2020-08-15 MED ORDER — SODIUM CHLORIDE 0.9% FLUSH
3.0000 mL | Freq: Two times a day (BID) | INTRAVENOUS | Status: DC
  Administered 2020-08-15 – 2020-08-16 (×2): 3 mL via INTRAVENOUS

## 2020-08-15 MED ORDER — SODIUM CHLORIDE 0.9% FLUSH: 3.0000 mL | INTRAVENOUS | Status: DC | PRN

## 2020-08-15 MED ORDER — ASPIRIN 81 MG PO CHEW
81.0000 mg | CHEWABLE_TABLET | ORAL | Status: AC
  Administered 2020-08-15: 81 mg via ORAL
  Filled 2020-08-15: qty 1

## 2020-08-15 MED ORDER — SODIUM CHLORIDE 0.9 % IV SOLN: INTRAVENOUS | Status: AC

## 2020-08-15 MED ORDER — COLCHICINE 0.6 MG PO TABS
0.6000 mg | ORAL_TABLET | Freq: Two times a day (BID) | ORAL | Status: DC
  Administered 2020-08-15 – 2020-08-16 (×2): 0.6 mg via ORAL
  Filled 2020-08-15 (×2): qty 1

## 2020-08-15 MED ORDER — LIDOCAINE HCL (PF) 1 % IJ SOLN
INTRAMUSCULAR | Status: AC
  Filled 2020-08-15: qty 30

## 2020-08-15 MED ORDER — IOHEXOL 350 MG/ML SOLN
INTRAVENOUS | Status: DC | PRN
  Administered 2020-08-15: 100 mL

## 2020-08-15 MED ORDER — HEPARIN SODIUM (PORCINE) 1000 UNIT/ML IJ SOLN
INTRAMUSCULAR | Status: AC
  Filled 2020-08-15: qty 1

## 2020-08-15 MED ORDER — ASPIRIN 81 MG PO CHEW: 81.0000 mg | CHEWABLE_TABLET | ORAL | Status: DC

## 2020-08-15 MED ORDER — NITROGLYCERIN 1 MG/10 ML FOR IR/CATH LAB
INTRA_ARTERIAL | Status: AC
  Filled 2020-08-15: qty 10

## 2020-08-15 MED ORDER — HYDRALAZINE HCL 20 MG/ML IJ SOLN: 10.0000 mg | INTRAMUSCULAR | Status: AC | PRN

## 2020-08-15 MED ORDER — FENTANYL CITRATE (PF) 100 MCG/2ML IJ SOLN
INTRAMUSCULAR | Status: AC
  Filled 2020-08-15: qty 2

## 2020-08-15 MED ORDER — ENOXAPARIN SODIUM 40 MG/0.4ML ~~LOC~~ SOLN: 40.0000 mg | SUBCUTANEOUS | Status: DC

## 2020-08-15 SURGICAL SUPPLY — 20 items
BALLN SAPPHIRE ~~LOC~~ 4.0X15 (BALLOONS) ×1 IMPLANT
BALLN WOLVERINE 3.00X15 (BALLOONS) ×4
BALLOON WOLVERINE 3.00X15 (BALLOONS) IMPLANT
CATH 5FR JL3.5 JR4 ANG PIG MP (CATHETERS) ×1 IMPLANT
CATH OPTICROSS HD (CATHETERS) ×1 IMPLANT
CATH VISTA GUIDE 6FR XBRCA (CATHETERS) ×1 IMPLANT
DEVICE RAD COMP TR BAND LRG (VASCULAR PRODUCTS) ×1 IMPLANT
GLIDESHEATH SLEND SS 6F .021 (SHEATH) ×1 IMPLANT
GUIDEWIRE INQWIRE 1.5J.035X260 (WIRE) IMPLANT
INQWIRE 1.5J .035X260CM (WIRE) ×2
KIT ENCORE 26 ADVANTAGE (KITS) ×1 IMPLANT
KIT HEART LEFT (KITS) ×2 IMPLANT
KIT HEMO VALVE WATCHDOG (MISCELLANEOUS) ×1 IMPLANT
PACK CARDIAC CATHETERIZATION (CUSTOM PROCEDURE TRAY) ×2 IMPLANT
SHEATH 6FR 75 DEST SLENDER (SHEATH) ×1 IMPLANT
SLED PULL BACK IVUS (MISCELLANEOUS) ×1 IMPLANT
STENT RESOLUTE ONYX 4.0X38 (Permanent Stent) ×1 IMPLANT
TRANSDUCER W/STOPCOCK (MISCELLANEOUS) ×2 IMPLANT
TUBING CIL FLEX 10 FLL-RA (TUBING) ×2 IMPLANT
WIRE ASAHI PROWATER 180CM (WIRE) ×1 IMPLANT

## 2020-08-15 NOTE — Progress Notes (Signed)
Patient c/o chest pain at an 8 on a scale on 0-10. Pain and pressure in chest radiating to bilateral shoulders/arns. PRN nitroglycerin given. B/p119/64, oxygen sats 95% on room air.

## 2020-08-15 NOTE — Interval H&P Note (Signed)
Cath Lab Visit (complete for each Cath Lab visit)  Clinical Evaluation Leading to the Procedure:   ACS: Yes.    Non-ACS:    Anginal Classification: CCS IV  Anti-ischemic medical therapy: Minimal Therapy (1 class of medications)  Non-Invasive Test Results: Intermediate-risk stress test findings: cardiac mortality 1-3%/year  Prior CABG: No previous CABG   Abnormal coronary CTA   History and Physical Interval Note:  08/15/2020 3:10 PM  Brandon [name]  has presented today for surgery, with the diagnosis of chest pain.  The various methods of treatment have been discussed with the patient and family. After consideration of risks, benefits and other options for treatment, the patient has consented to  Procedure(s): LEFT HEART CATH AND CORONARY ANGIOGRAPHY (N/A) as a surgical intervention.  The patient's history has been reviewed, patient examined, no change in status, stable for surgery.  I have reviewed the patient's chart and labs.  Questions were answered to the patient's satisfaction.     Lance Muss

## 2020-08-15 NOTE — Progress Notes (Signed)
Nitroglycerin given SL x 2 86/52. Byrd Hesselbach PA paged. Patient placed on 2L oxygen via [place]. Oxygen saturation at 96%.

## 2020-08-15 NOTE — Progress Notes (Addendum)
Progress Note  Patient Name: Brandon [name] Date of Encounter: 08/15/2020  Primary Cardiologist: New patient/Dr Shari ProwsPemberton  Subjective   No specific complaints today. Plan for cath later today due to abnormal cCTA.   Inpatient Medications    Scheduled Meds: . amLODipine  10 mg Oral Daily  . ARIPiprazole  10 mg Oral Daily  . aspirin  81 mg Oral Daily  . buPROPion  450 mg Oral Daily  . empagliflozin  25 mg Oral Daily  . enoxaparin (LOVENOX) injection  40 mg Subcutaneous Q24H  . insulin aspart  0-15 Units Subcutaneous TID WC  . insulin glargine  60 Units Subcutaneous Daily  . lidocaine  1 patch Transdermal Q24H  . metoprolol tartrate  25 mg Oral BID  . pantoprazole  40 mg Oral Daily  . pioglitazone  30 mg Oral Daily  . pravastatin  40 mg Oral QHS  . sodium chloride flush  3 mL Intravenous Q12H  . tamsulosin  0.4 mg Oral Daily   Continuous Infusions: . sodium chloride    . sodium chloride 1 mL/kg/hr (08/15/20 1000)   PRN Meds: sodium chloride, acetaminophen, ALPRAZolam, labetalol, metoCLOPramide, nitroGLYCERIN, ondansetron (ZOFRAN) IV, ondansetron, sodium chloride flush, tiZANidine, traMADol, zolpidem   Vital Signs    Vitals:   08/14/20 1158 08/14/20 2100 08/15/20 0406 08/15/20 0809  BP: 93/63 128/69 119/70   Pulse: 70  66   Resp: 18 18 20    Temp: (!) 97.3 F (36.3 C) 97.6 F (36.4 C) 97.7 F (36.5 C)   TempSrc: Axillary Oral Oral   SpO2: 99% 94% 92% 98%  Weight:      Height:        Intake/Output Summary (Last 24 hours) at 08/15/2020 1013 Last data filed at 08/15/2020 [id number] Gross per 24 hour  Intake 668.18 ml  Output --  Net 668.18 ml   Filed Weights   08/12/20 1620 08/13/20 0505 08/14/20 0342  Weight: 97.5 kg 96.3 kg 97.4 kg    Physical Exam   General: Well developed, well nourished, NAD Neck: Negative for carotid bruits. No JVD Lungs:Clear to ausculation bilaterally. No wheezes, rales, or rhonchi. Breathing is unlabored. Cardiovascular: RRR  with S1 S2. No murmurs Extremities: No edema. Radial  pulses 2+ bilaterally Neuro: Alert and oriented. No focal deficits. No facial asymmetry. MAE spontaneously. Psych: Responds to questions appropriately with normal affect.    Labs    Chemistry Recent Labs  Lab 08/11/20 1415 08/12/20 0033 08/15/20 0515  NA 136  --  139  K 3.2*  --  3.7  CL 101  --  101  CO2 22  --  27  GLUCOSE 217*  --  78  BUN 7*  --  14  CREATININE 0.86  --  0.88  CALCIUM 8.9  --  9.0  PROT  --  7.0  --   ALBUMIN  --  3.8  --   AST  --  24  --   ALT  --  14  --   ALKPHOS  --  65  --   BILITOT  --  0.9  --   GFRNONAA >60  --  >60  ANIONGAP 13  --  11     Hematology Recent Labs  Lab 08/11/20 1415 08/15/20 0515  WBC 6.2 7.8  RBC 5.52 5.61  HGB 15.8 15.6  HCT 47.6 49.6  MCV 86.2 88.4  MCH 28.6 27.8  MCHC 33.2 31.5  RDW 16.6* 16.7*  PLT 232 262  Cardiac EnzymesNo results for input(s): TROPONINI in the last 168 hours. No results for input(s): TROPIPOC in the last 168 hours.   BNPNo results for input(s): BNP, PROBNP in the last 168 hours.   DDimer  Recent Labs  Lab 08/11/20 1623  DDIMER 0.31     Radiology    CT CORONARY MORPH W/CTA COR W/SCORE W/CA W/CM &/OR WO/CM  Addendum Date: 08/14/2020   ADDENDUM REPORT: 08/14/2020 14:20 CLINICAL DATA:  64 year old male with h/o DM, hypertension, hyperlipidemia, former smoker who presented with chest pain. EXAM: Cardiac/Coronary  CTA TECHNIQUE: The patient was scanned on a [employer]. FINDINGS: A 100 kV prospective scan was triggered in the descending thoracic aorta at 111 HU's. Axial non-contrast 3 mm slices were carried out through the heart. The data set was analyzed on a dedicated work station and scored using the Agatson method. Gantry rotation speed was 250 msecs and collimation was .6 mm. 100 mg of PO Metoprolol and 0.8 mg of sl NTG were given. The 3D data set was reconstructed in 5% intervals of the 67-82 % of the R-R cycle.  Diastolic phases were analyzed on a dedicated work station using MPR, MIP and VRT modes. The patient received 80 cc of contrast. Aorta: Normal size. Mild diffuse atherosclerotic plaque and calcifications. No dissection. Aortic Valve:  Trileaflet.  No calcifications. Coronary Arteries:  Normal coronary origin.  Right dominance. RCA is a large dominant artery that gives rise to PDA and PLA. There is mild calcified plaque in the proximal RCA with stenosis 25-49%. Mid RCA has severe diffuse predominantly calcified plaque with a focal stenosis suspicious for > 70%. Distal RCA has moderate calcified plaque prior to bifurcation into PLA and PDA with stenosis 50-69%. PLA is small with diffuse moderate plaque. PDA is a medium caliber artery with diffuse moderate plaque in a bead like pattern. Left main is a large artery that gives rise to LAD and LCX arteries. Left main is a long artery with minimal calcified plaque and stenosis 0-25%. LAD is a large vessel that gives rise to two diagonal arteries. There is mild diffuse calcified plaque with focal stenoses in the proximal and mid LAD of 25-49%. D1 is very small. D2 is a medium caliber artery (2.1 mm) that has severe mixed ostial plaque with lipid rich core and is suspicious for stenosis > 70%. LCX is a large caliber non-dominant artery that gives rise to two OM branches. Proximal LCX artery has severe non-calcified plaque suspicious for stenosis > 70%, this is followed by a moderate diffuse calcified plaque with stenosis 50-69%. Mid and distal LCX artery has small lumen and mild diffuse plaque. OM1 is very small and has severe diffuse plaque. OM2 is a medium caliber artery with diffuse moderate plaque in a bead like pattern. Other findings: Normal pulmonary vein drainage into the left atrium. Normal left atrial appendage without a thrombus. Normal size of the pulmonary artery. IMPRESSION: 1. Coronary calcium score of 1201. This was 95 percentile for age and sex matched  control. 2. Normal coronary origin with right dominance. 3. CAD-RADS 4 Severe stenosis. (70-99%). Diffuse CAD in a bead-like diabetic pattern, there is suspicion for severe stenoses in the mid RCA, ostial portion of a large D2 and proximal portion of LCX artery. Additional analysis with CT FFR will be submitted. We will tentatively plan for a left cardiac catheterization for tomorrow. Electronically Signed   By: Tobias Alexander   On: 08/14/2020 14:20   Result Date: 08/14/2020 EXAM: Gaye Alken  INTERPRETATION  CT CHEST The following report is an over-read performed by radiologist Dr. Genevive Bi of [facility] on 08/14/2020. This over-read does not include interpretation of cardiac or coronary anatomy or pathology. The coronary CTA interpretation by the cardiologist is attached. COMPARISON:  CT a 08/11/2020 FINDINGS: Limited view of the lung parenchyma demonstrates mild LEFT basilar atelectasis pleural thickening with calcifications. Airways are normal. Limited view of the mediastinum demonstrates no adenopathy. Esophagus normal. Limited view of the upper abdomen unremarkable. Limited view of the skeleton and chest wall is unremarkable. IMPRESSION: Pleural thickening and calcifications at the LEFT lung base. No change from prior Electronically Signed: By: Genevive Bi M.D. On: 08/14/2020 12:05   CT CORONARY FRACTIONAL FLOW RESERVE FLUID ANALYSIS  Result Date: 08/14/2020 EXAM: CT FFR ANALYSIS CLINICAL DATA:  64 year old male with diabetes, chest pain and abnormal coronary CTA. FINDINGS: FFRct analysis was performed on the original cardiac CT angiogram dataset. Diagrammatic representation of the FFRct analysis is provided in a separate PDF document in PACS. This dictation was created using the PDF document and an interactive 3D model of the results. 3D model is not available in the EMR/PACS. Normal FFR range is >0.80. 1. Left Main: 0.98. 2. LAD: Proximal: 0.97, distal: 0.87. 3. D1: Proximal:  0.75. 4. LCX: Proximal; 0.94, mid: Occluded. 5. RCA: Proximal: 0.98, distal: 0.77. IMPRESSION: 1. CT FFR analysis showed significant stenoses in proximal RCA, ostial portion of a large D1 and occluded proximal portion of LCX artery. Left cardiac catheterization is recommended. Electronically Signed   By: Tobias Alexander   On: 08/14/2020 17:41   ECHOCARDIOGRAM COMPLETE  Result Date: 08/13/2020    ECHOCARDIOGRAM REPORT   Patient Name:   Brandon [name] Date of Exam: 08/13/2020 Medical Rec #:  [id number]      Height:       69.0 in Accession #:    [id number]     Weight:       212.2 lb Date of Birth:  1956/08/08      BSA:          2.119 m Patient Age:    64 years       BP:           154/79 mmHg Patient Gender: M              HR:           92 bpm. Exam Location:  Inpatient Procedure: 2D Echo Indications:    chest pain  History:        Patient has no prior history of Echocardiogram examinations.                 Signs/Symptoms:Chest Pain; Risk Factors:Hypertension,                 Dyslipidemia and Diabetes.  Sonographer:    Delcie Roch Referring Phys: [id number] PING T ZHANG IMPRESSIONS  1. Left ventricular ejection fraction, by estimation, is 60 to 65%. The left ventricle has normal function. The left ventricle has no regional wall motion abnormalities. There is mild left ventricular hypertrophy. Left ventricular diastolic parameters are consistent with Grade I diastolic dysfunction (impaired relaxation).  2. Right ventricular systolic function is normal. The right ventricular size is normal.  3. The mitral valve is normal in structure. No evidence of mitral valve regurgitation. No evidence of mitral stenosis.  4. The aortic valve is tricuspid. Aortic valve regurgitation is not visualized. No aortic stenosis is present.  5. The inferior  vena cava is normal in size with greater than 50% respiratory variability, suggesting right atrial pressure of 3 mmHg. FINDINGS  Left Ventricle: Left ventricular ejection fraction,  by estimation, is 60 to 65%. The left ventricle has normal function. The left ventricle has no regional wall motion abnormalities. The left ventricular internal cavity size was normal in size. There is  mild left ventricular hypertrophy. Left ventricular diastolic parameters are consistent with Grade I diastolic dysfunction (impaired relaxation). Normal left ventricular filling pressure. Right Ventricle: The right ventricular size is normal. No increase in right ventricular wall thickness. Right ventricular systolic function is normal. Left Atrium: Left atrial size was normal in size. Right Atrium: Right atrial size was normal in size. Pericardium: There is no evidence of pericardial effusion. Mitral Valve: The mitral valve is normal in structure. No evidence of mitral valve regurgitation. No evidence of mitral valve stenosis. Tricuspid Valve: The tricuspid valve is normal in structure. Tricuspid valve regurgitation is not demonstrated. No evidence of tricuspid stenosis. Aortic Valve: The aortic valve is tricuspid. Aortic valve regurgitation is not visualized. No aortic stenosis is present. Pulmonic Valve: The pulmonic valve was not well visualized. Pulmonic valve regurgitation is not visualized. No evidence of pulmonic stenosis. Aorta: The aortic root is normal in size and structure. Venous: The inferior vena cava is normal in size with greater than 50% respiratory variability, suggesting right atrial pressure of 3 mmHg. IAS/Shunts: No atrial level shunt detected by color flow Doppler.  LEFT VENTRICLE PLAX 2D LVIDd:         4.70 cm  Diastology LVIDs:         3.40 cm  LV e' medial:    6.20 cm/s LV PW:         1.10 cm  LV E/e' medial:  11.1 LV IVS:        0.90 cm  LV e' lateral:   9.46 cm/s LVOT diam:     2.20 cm  LV E/e' lateral: 7.3 LV SV:         65 LV SV Index:   31 LVOT Area:     3.80 cm  RIGHT VENTRICLE             IVC RV S prime:     12.80 cm/s  IVC diam: 1.60 cm TAPSE (M-mode): 1.7 cm LEFT ATRIUM              Index       RIGHT ATRIUM           Index LA diam:        4.00 cm 1.89 cm/m  RA Area:     12.50 cm LA Vol (A2C):   34.6 ml 16.33 ml/m RA Volume:   26.20 ml  12.37 ml/m LA Vol (A4C):   34.3 ml 16.19 ml/m LA Biplane Vol: 35.3 ml 16.66 ml/m  AORTIC VALVE LVOT Vmax:   107.41 cm/s LVOT Vmean:  69.396 cm/s LVOT VTI:    0.171 m  AORTA Ao Root diam: 3.20 cm Ao Asc diam:  3.20 cm MITRAL VALVE MV Area (PHT): 4.71 cm     SHUNTS MV Decel Time: 161 msec     Systemic VTI:  0.17 m MV E velocity: 69.00 cm/s   Systemic Diam: 2.20 cm MV A velocity: 105.00 cm/s MV E/A ratio:  0.66 Jonathan Branch MD Electronically signed by Jonathan Branch MD Signature Date/Time: 08/13/2020/11:35:37 AM    Final    Telemetry    08/15/20 NSR- Personally Reviewed    ECG    No new tracing as of 08/15/20- Personally Reviewed  Cardiac Studies   TTE: 08/14/2020 1. Left ventricular ejection fraction, by estimation, is 60 to 65%. The  left ventricle has normal function. The left ventricle has no regional  wall motion abnormalities. There is mild left ventricular hypertrophy.  Left ventricular diastolic parameters  are consistent with Grade I diastolic dysfunction (impaired relaxation).  2. Right ventricular systolic function is normal. The right ventricular  size is normal.  3. The mitral valve is normal in structure. No evidence of mitral valve  regurgitation. No evidence of mitral stenosis.  4. The aortic valve is tricuspid. Aortic valve regurgitation is not  visualized. No aortic stenosis is present.  5. The inferior vena cava is normal in size with greater than 50%  respiratory variability, suggesting right atrial pressure of 3 mmHg.   Patient Profile     64 y.o. male with a hx of HTN,DM, HLD, diabetic gastroparesis and anxietywho is being seen today for the evaluation of chest painat the request ofDr. Jomarie Longs.  Assessment & Plan    1. Chest pain with CAD per cCTA 08/14/20: -Pt presented with chest pain and  underwent coronary CTA 08/14/20 which was abnormal with CT FFR showing significant stenosis in the pRCA, ostial D1 and occluded pLCx -Plan is for LHC today  -Denies recurrent angina  -Continue ASA 81, metoprolol 25, pravastatin 40 -LDL on 08/13/20>>756 -HbA1c, 10.1  2. Hypertension -Stable, 119/70>128/69>119/75 -Continue amlodipine 10 mg daily, metoprolol 25  3.  Uncontrolled DM2: -HbA1c, 10.1 this admission  -SSI while inpatient -Management per IM    Signed, Georgie Chard NP-C [facility] Pager: [phone] 08/15/2020, 10:13 AM     For questions or updates, please contact   Please consult [email or web address] for contact info under Cardiology/STEMI.  The patient was seen, examined and discussed with Georgie Chard, NP  and I agree with the above.   Coronary CTA showed 3 vessel diffuse CAD in a diabetic pattern, he is awaiting cath today, he will most probably need 3 vessel CABG. BP and ZHR at goal, Crea normal, on ASA and atorvastatin, metoprolol. LVEF 60-65%. He will need referral to an endocrinologist after discharge, HbA1c 10%.   Tobias Alexander, MD 08/15/2020

## 2020-08-15 NOTE — Progress Notes (Signed)
Nitroglycerin given SL x 1. BP rechecked. 119/66.

## 2020-08-15 NOTE — Progress Notes (Signed)
PROGRESS NOTE    Brandon [name]  NTZ:[id number] DOB: [birth year] DOA: 08/11/2020 PCP: Everrett Coombe, DO  Brief Narrative: 65 year old male with history of type 2 diabetes mellitus on insulin, hypertension, dyslipidemia, chronic back pain, gastroparesis, anxiety and depression presented to the ED with chest pain off and on since Wednesday 12/15. -Pressure-like, worsened with exertion -Work-up in the ED noted negative troponins, nonischemic EKG -Cardiology consulted, coronary CTA concerning for three-vessel CAD, cath today  Assessment & Plan:   Atypical chest pain with some typical features -EKG unremarkable, high-sensitivity troponins are negative -D-dimer and CTA chest negative for PE -Seen by cardiology in consultation, underwent coronary CTA yesterday which showed three-vessel diffuse CAD, plan for left heart cath this afternoon -Continue aspirin, metoprolol and statin  Hypertension -Continue amlodipine  Uncontrolled type 2 diabetes mellitus -Hemoglobin A1c is 10.1 -Continued Lantus, Actos, Jardiance -CBGs are stable, change Lantus to qpm today  Anxiety Depression -Continue SSRI, Abilify, Wellbutrin  Chronic back pain -Continue home regimen of tramadol/Zanaflex  DVT prophylaxis: Lovenox Code Status: Full code Family Communication: No family at bedside Disposition Plan:  Status is: Inpatient pending cardiac work-up  Dispo: The patient is from: Home              Anticipated d/c is to: Home              Anticipated d/c date is: Possibly tomorrow  is not medically stable to d/c.  Consultants:  Cardiology  Procedures:   Antimicrobials:    Subjective: -Feels okay, slightly anxious about cath this afternoon  Objective: Vitals:   08/14/20 1158 08/14/20 2100 08/15/20 0406 08/15/20 0809  BP: 93/63 128/69 119/70   Pulse: 70  66   Resp: 18 18 20    Temp: (!) 97.3 F (36.3 C) 97.6 F (36.4 C) 97.7 F (36.5 C)   TempSrc: Axillary Oral Oral   SpO2: 99% 94% 92% 98%   Weight:      Height:        Intake/Output Summary (Last 24 hours) at 08/15/2020 1401 Last data filed at 08/15/2020 08/17/2020 Gross per 24 hour  Intake 668.18 ml  Output -  Net 668.18 ml   Filed Weights   08/12/20 1620 08/13/20 0505 08/14/20 0342  Weight: 97.5 kg 96.3 kg 97.4 kg    Examination:  General exam: Pleasant male sitting up in bed, AAOx3, no distress CVS: S1-S2, regular rate rhythm Lungs: Clear bilaterally Abdomen: Soft, nontender, bowel sounds present Extremities: No edema Skin: No rashes on exposed skin Psychiatry: Judgement and insight appear normal. Mood & affect appropriate.   Data Reviewed:   CBC: Recent Labs  Lab 08/11/20 1415 08/15/20 0515  WBC 6.2 7.8  HGB 15.8 15.6  HCT 47.6 49.6  MCV 86.2 88.4  PLT 232 262   Basic Metabolic Panel: Recent Labs  Lab 08/11/20 1415 08/15/20 0515  NA 136 139  K 3.2* 3.7  CL 101 101  CO2 22 27  GLUCOSE 217* 78  BUN 7* 14  CREATININE 0.86 0.88  CALCIUM 8.9 9.0   GFR: Estimated Creatinine Clearance: 97.6 mL/min (by C-G formula based on SCr of 0.88 mg/dL). Liver Function Tests: Recent Labs  Lab 08/12/20 0033  AST 24  ALT 14  ALKPHOS 65  BILITOT 0.9  PROT 7.0  ALBUMIN 3.8   Recent Labs  Lab 08/12/20 0033  LIPASE 38   No results for input(s): AMMONIA in the last 168 hours. Coagulation Profile: No results for input(s): INR, PROTIME in the last 168 hours. Cardiac  Enzymes: No results for input(s): CKTOTAL, CKMB, CKMBINDEX, TROPONINI in the last 168 hours. BNP (last 3 results) No results for input(s): PROBNP in the last 8760 hours. HbA1C: Recent Labs    08/13/20 0119  HGBA1C 10.1*   CBG: Recent Labs  Lab 08/14/20 1622 08/14/20 2207 08/15/20 0803 08/15/20 1123 08/15/20 1318  GLUCAP 99 112* 110* 124* 84   Lipid Profile: Recent Labs    08/13/20 0119  CHOL 160  HDL 32*  LDLCALC 76  TRIG 824*  CHOLHDL 5.0   Thyroid Function Tests: Recent Labs    08/13/20 0119  TSH 2.345    Anemia Panel: No results for input(s): VITAMINB12, FOLATE, FERRITIN, TIBC, IRON, RETICCTPCT in the last 72 hours. Urine analysis:    Component Value Date/Time   COLORURINE YELLOW 04/14/2020 1908   APPEARANCEUR CLEAR 04/14/2020 1908   LABSPEC 1.014 04/14/2020 1908   PHURINE 5.0 04/14/2020 1908   GLUCOSEU 50 (A) 04/14/2020 1908   HGBUR NEGATIVE 04/14/2020 1908   BILIRUBINUR negative 06/22/2020 1150   KETONESUR negative 06/22/2020 1150   KETONESUR NEGATIVE 04/14/2020 1908   PROTEINUR 30 (A) 04/14/2020 1908   UROBILINOGEN 0.2 06/22/2020 1150   NITRITE Negative 06/22/2020 1150   NITRITE NEGATIVE 04/14/2020 1908   LEUKOCYTESUR Negative 06/22/2020 1150   LEUKOCYTESUR NEGATIVE 04/14/2020 1908   Sepsis Labs: @LABRCNTIP (procalcitonin:4,lacticidven:4)  ) Recent Results (from the past 240 hour(s))  Resp Panel by RT-PCR (Flu A&B, Covid) Nasopharyngeal Swab     Status: None   Collection Time: 08/11/20 10:40 PM   Specimen: Nasopharyngeal Swab; Nasopharyngeal(NP) swabs in vial transport medium  Result Value Ref Range Status   SARS Coronavirus 2 by RT PCR NEGATIVE NEGATIVE Final    Comment: (NOTE) SARS-CoV-2 target nucleic acids are NOT DETECTED.  The SARS-CoV-2 RNA is generally detectable in upper respiratory specimens during the acute phase of infection. The lowest concentration of SARS-CoV-2 viral copies this assay can detect is 138 copies/mL. A negative result does not preclude SARS-Cov-2 infection and should not be used as the sole basis for treatment or other patient management decisions. A negative result may occur with  improper specimen collection/handling, submission of specimen other than nasopharyngeal swab, presence of viral mutation(s) within the areas targeted by this assay, and inadequate number of viral copies(<138 copies/mL). A negative result must be combined with clinical observations, patient history, and epidemiological information. The expected result is  Negative.  Fact Sheet for Patients:  08/13/20  Fact Sheet for Healthcare Providers:  BloggerCourse.com  This test is no t yet approved or cleared by the [employer].[place] FDA and  has been authorized for detection and/or diagnosis of SARS-CoV-2 by FDA under an Emergency Use Authorization (EUA). This EUA will remain  in effect (meaning this test can be used) for the duration of the COVID-19 declaration under Section 564(b)(1) of the Act, 21 U.S.C.section 360bbb-3(b)(1), unless the authorization is terminated  or revoked sooner.       Influenza A by PCR NEGATIVE NEGATIVE Final   Influenza B by PCR NEGATIVE NEGATIVE Final    Comment: (NOTE) The Xpert Xpress SARS-CoV-2/FLU/RSV plus assay is intended as an aid in the diagnosis of influenza from Nasopharyngeal swab specimens and should not be used as a sole basis for treatment. Nasal washings and aspirates are unacceptable for Xpert Xpress SARS-CoV-2/FLU/RSV testing.  Fact Sheet for Patients: [place]  Fact Sheet for Healthcare Providers: BloggerCourse.com  This test is not yet approved or cleared by the [employer]ious[employer] FDA and has been authorized for detection  and/or diagnosis of SARS-CoV-2 by FDA under an Emergency Use Authorization (EUA). This EUA will remain in effect (meaning this test can be used) for the duration of the COVID-19 declaration under Section 564(b)(1) of the Act, 21 U.S.C. section 360bbb-3(b)(1), unless the authorization is terminated or revoked.  Performed at [facility] [place], [address] [place], [place] [address]          Radiology Studies: [facility]/CTA COR W/SCORE W/CA W/CM &/OR WO/CM  Addendum Date: 08/14/2020   ADDENDUM REPORT: 08/14/2020 14:20 CLINICAL DATA:  64 year old male with h/o DM, hypertension, hyperlipidemia, former smoker who presented with chest pain.  EXAM: Cardiac/Coronary  CTA TECHNIQUE: The patient was scanned on a [employer] scanner. FINDINGS: A 100 kV prospective scan was triggered in the descending thoracic aorta at 111 HU's. Axial non-contrast 3 mm slices were carried out through the heart. The data set was analyzed on a dedicated work station and scored using the Agatson method. Gantry rotation speed was 250 msecs and collimation was .6 mm. 100 mg of PO Metoprolol and 0.8 mg of sl NTG were given. The 3D data set was reconstructed in 5% intervals of the 67-82 % of the R-R cycle. Diastolic phases were analyzed on a dedicated work station using MPR, MIP and VRT modes. The patient received 80 cc of contrast. Aorta: Normal size. Mild diffuse atherosclerotic plaque and calcifications. No dissection. Aortic Valve:  Trileaflet.  No calcifications. Coronary Arteries:  Normal coronary origin.  Right dominance. RCA is a large dominant artery that gives rise to PDA and PLA. There is mild calcified plaque in the proximal RCA with stenosis 25-49%. Mid RCA has severe diffuse predominantly calcified plaque with a focal stenosis suspicious for > 70%. Distal RCA has moderate calcified plaque prior to bifurcation into PLA and PDA with stenosis 50-69%. PLA is small with diffuse moderate plaque. PDA is a medium caliber artery with diffuse moderate plaque in a bead like pattern. Left main is a large artery that gives rise to LAD and LCX arteries. Left main is a long artery with minimal calcified plaque and stenosis 0-25%. LAD is a large vessel that gives rise to two diagonal arteries. There is mild diffuse calcified plaque with focal stenoses in the proximal and mid LAD of 25-49%. D1 is very small. D2 is a medium caliber artery (2.1 mm) that has severe mixed ostial plaque with lipid rich core and is suspicious for stenosis > 70%. LCX is a large caliber non-dominant artery that gives rise to two OM branches. Proximal LCX artery has severe non-calcified plaque suspicious for  stenosis > 70%, this is followed by a moderate diffuse calcified plaque with stenosis 50-69%. Mid and distal LCX artery has small lumen and mild diffuse plaque. OM1 is very small and has severe diffuse plaque. OM2 is a medium caliber artery with diffuse moderate plaque in a bead like pattern. Other findings: Normal pulmonary vein drainage into the left atrium. Normal left atrial appendage without a thrombus. Normal size of the pulmonary artery. IMPRESSION: 1. Coronary calcium score of 1201. This was 95 percentile for age and sex matched control. 2. Normal coronary origin with right dominance. 3. CAD-RADS 4 Severe stenosis. (70-99%). Diffuse CAD in a bead-like diabetic pattern, there is suspicion for severe stenoses in the mid RCA, ostial portion of a large D2 and proximal portion of LCX artery. Additional analysis with CT FFR will be submitted. We will tentatively plan for a left cardiac catheterization for tomorrow. Electronically  Signed   By: Tobias Alexander   On: 08/14/2020 14:20   Result Date: 08/14/2020 EXAM: OVER-READ INTERPRETATION  CT CHEST The following report is an over-read performed by radiologist Dr. Genevive Bi of [facility] on 08/14/2020. This over-read does not include interpretation of cardiac or coronary anatomy or pathology. The coronary CTA interpretation by the cardiologist is attached. COMPARISON:  CT a 08/11/2020 FINDINGS: Limited view of the lung parenchyma demonstrates mild LEFT basilar atelectasis pleural thickening with calcifications. Airways are normal. Limited view of the mediastinum demonstrates no adenopathy. Esophagus normal. Limited view of the upper abdomen unremarkable. Limited view of the skeleton and chest wall is unremarkable. IMPRESSION: Pleural thickening and calcifications at the LEFT lung base. No change from prior Electronically Signed: By: Genevive Bi M.D. On: 08/14/2020 12:05   CT CORONARY FRACTIONAL FLOW RESERVE FLUID ANALYSIS  Result  Date: 08/14/2020 EXAM: CT FFR ANALYSIS CLINICAL DATA:  64 year old male with diabetes, chest pain and abnormal coronary CTA. FINDINGS: FFRct analysis was performed on the original cardiac CT angiogram dataset. Diagrammatic representation of the FFRct analysis is provided in a separate PDF document in PACS. This dictation was created using the PDF document and an interactive 3D model of the results. 3D model is not available in the EMR/PACS. Normal FFR range is >0.80. 1. Left Main: 0.98. 2. LAD: Proximal: 0.97, distal: 0.87. 3. D1: Proximal: 0.75. 4. LCX: Proximal; 0.94, mid: Occluded. 5. RCA: Proximal: 0.98, distal: 0.77. IMPRESSION: 1. CT FFR analysis showed significant stenoses in proximal RCA, ostial portion of a large D1 and occluded proximal portion of LCX artery. Left cardiac catheterization is recommended. Electronically Signed   By: Tobias Alexander   On: 08/14/2020 17:41   Scheduled Meds: . amLODipine  10 mg Oral Daily  . ARIPiprazole  10 mg Oral Daily  . aspirin  81 mg Oral Daily  . buPROPion  450 mg Oral Daily  . empagliflozin  25 mg Oral Daily  . enoxaparin (LOVENOX) injection  40 mg Subcutaneous Q24H  . insulin aspart  0-15 Units Subcutaneous TID WC  . insulin glargine  60 Units Subcutaneous Daily  . lidocaine  1 patch Transdermal Q24H  . metoprolol tartrate  25 mg Oral BID  . pantoprazole  40 mg Oral Daily  . pioglitazone  30 mg Oral Daily  . pravastatin  40 mg Oral QHS  . sodium chloride flush  3 mL Intravenous Q12H  . tamsulosin  0.4 mg Oral Daily   Continuous Infusions: . sodium chloride    . sodium chloride 1 mL/kg/hr (08/15/20 1000)     LOS: 2 days    Time spent:  Zannie Cove, MD [facility]  08/15/2020, 2:01 PM

## 2020-08-15 NOTE — Plan of Care (Signed)

## 2020-08-15 NOTE — Progress Notes (Addendum)
Nutrition Brief Note  Patient identified on the Malnutrition Screening Tool (MST) Report. He reports that he has lost some weight recently. He has not been trying to lose weight, but thinks that he has been eating less due to decreased appetite. Even with decreased appetite, he usually eats 2 bacon biscuits, coffee, and orange juice for breakfast. He goes out to eat lunch and dinner most days and intake is good. Appetite has returned and intake has been good since admission. Upon further discussion with patient, he drinks a lot of Cheerwine and Pepsi sodas throughout the day. He recently switched from regular sodas to sugar free/zero sodas and this is likely the reason for his weight loss. 5% weight loss within 2 months is not significant for the time frame. Nutrition focused physical exam completed.  No muscle or subcutaneous fat depletion noticed.  Wt Readings from Last 15 Encounters:  08/14/20 97.4 kg  06/22/20 103 kg  06/02/20 103.2 kg  04/16/20 105.2 kg  04/06/20 105.2 kg    Body mass index is 31.72 kg/m. Patient meets criteria for obesity based on current BMI.   Current diet order is NPO for cardiac cath, previously on a heart healthy diet, consuming approximately 95% of meals at this time. Labs and medications reviewed.   No nutrition interventions warranted at this time. If nutrition issues arise, please consult RD.   Gabriel Rainwater, RD, LDN, CNSC Please refer to [facility] for contact information.

## 2020-08-15 NOTE — H&P (View-Only) (Signed)
Progress Note  Patient Name: Brandon [name] Date of Encounter: 08/15/2020  Primary Cardiologist: New patient/Dr Shari ProwsPemberton  Subjective   No specific complaints today. Plan for cath later today due to abnormal cCTA.   Inpatient Medications    Scheduled Meds: . amLODipine  10 mg Oral Daily  . ARIPiprazole  10 mg Oral Daily  . aspirin  81 mg Oral Daily  . buPROPion  450 mg Oral Daily  . empagliflozin  25 mg Oral Daily  . enoxaparin (LOVENOX) injection  40 mg Subcutaneous Q24H  . insulin aspart  0-15 Units Subcutaneous TID WC  . insulin glargine  60 Units Subcutaneous Daily  . lidocaine  1 patch Transdermal Q24H  . metoprolol tartrate  25 mg Oral BID  . pantoprazole  40 mg Oral Daily  . pioglitazone  30 mg Oral Daily  . pravastatin  40 mg Oral QHS  . sodium chloride flush  3 mL Intravenous Q12H  . tamsulosin  0.4 mg Oral Daily   Continuous Infusions: . sodium chloride    . sodium chloride 1 mL/kg/hr (08/15/20 1000)   PRN Meds: sodium chloride, acetaminophen, ALPRAZolam, labetalol, metoCLOPramide, nitroGLYCERIN, ondansetron (ZOFRAN) IV, ondansetron, sodium chloride flush, tiZANidine, traMADol, zolpidem   Vital Signs    Vitals:   08/14/20 1158 08/14/20 2100 08/15/20 0406 08/15/20 0809  BP: 93/63 128/69 119/70   Pulse: 70  66   Resp: 18 18 20    Temp: (!) 97.3 F (36.3 C) 97.6 F (36.4 C) 97.7 F (36.5 C)   TempSrc: Axillary Oral Oral   SpO2: 99% 94% 92% 98%  Weight:      Height:        Intake/Output Summary (Last 24 hours) at 08/15/2020 1013 Last data filed at 08/15/2020 [id number] Gross per 24 hour  Intake 668.18 ml  Output --  Net 668.18 ml   Filed Weights   08/12/20 1620 08/13/20 0505 08/14/20 0342  Weight: 97.5 kg 96.3 kg 97.4 kg    Physical Exam   General: Well developed, well nourished, NAD Neck: Negative for carotid bruits. No JVD Lungs:Clear to ausculation bilaterally. No wheezes, rales, or rhonchi. Breathing is unlabored. Cardiovascular: RRR  with S1 S2. No murmurs Extremities: No edema. Radial  pulses 2+ bilaterally Neuro: Alert and oriented. No focal deficits. No facial asymmetry. MAE spontaneously. Psych: Responds to questions appropriately with normal affect.    Labs    Chemistry Recent Labs  Lab 08/11/20 1415 08/12/20 0033 08/15/20 0515  NA 136  --  139  K 3.2*  --  3.7  CL 101  --  101  CO2 22  --  27  GLUCOSE 217*  --  78  BUN 7*  --  14  CREATININE 0.86  --  0.88  CALCIUM 8.9  --  9.0  PROT  --  7.0  --   ALBUMIN  --  3.8  --   AST  --  24  --   ALT  --  14  --   ALKPHOS  --  65  --   BILITOT  --  0.9  --   GFRNONAA >60  --  >60  ANIONGAP 13  --  11     Hematology Recent Labs  Lab 08/11/20 1415 08/15/20 0515  WBC 6.2 7.8  RBC 5.52 5.61  HGB 15.8 15.6  HCT 47.6 49.6  MCV 86.2 88.4  MCH 28.6 27.8  MCHC 33.2 31.5  RDW 16.6* 16.7*  PLT 232 262  Cardiac EnzymesNo results for input(s): TROPONINI in the last 168 hours. No results for input(s): TROPIPOC in the last 168 hours.   BNPNo results for input(s): BNP, PROBNP in the last 168 hours.   DDimer  Recent Labs  Lab 08/11/20 1623  DDIMER 0.31     Radiology    CT CORONARY MORPH W/CTA COR W/SCORE W/CA W/CM &/OR WO/CM  Addendum Date: 08/14/2020   ADDENDUM REPORT: 08/14/2020 14:20 CLINICAL DATA:  64 year old male with h/o DM, hypertension, hyperlipidemia, former smoker who presented with chest pain. EXAM: Cardiac/Coronary  CTA TECHNIQUE: The patient was scanned on a [employer]. FINDINGS: A 100 kV prospective scan was triggered in the descending thoracic aorta at 111 HU's. Axial non-contrast 3 mm slices were carried out through the heart. The data set was analyzed on a dedicated work station and scored using the Agatson method. Gantry rotation speed was 250 msecs and collimation was .6 mm. 100 mg of PO Metoprolol and 0.8 mg of sl NTG were given. The 3D data set was reconstructed in 5% intervals of the 67-82 % of the R-R cycle.  Diastolic phases were analyzed on a dedicated work station using MPR, MIP and VRT modes. The patient received 80 cc of contrast. Aorta: Normal size. Mild diffuse atherosclerotic plaque and calcifications. No dissection. Aortic Valve:  Trileaflet.  No calcifications. Coronary Arteries:  Normal coronary origin.  Right dominance. RCA is a large dominant artery that gives rise to PDA and PLA. There is mild calcified plaque in the proximal RCA with stenosis 25-49%. Mid RCA has severe diffuse predominantly calcified plaque with a focal stenosis suspicious for > 70%. Distal RCA has moderate calcified plaque prior to bifurcation into PLA and PDA with stenosis 50-69%. PLA is small with diffuse moderate plaque. PDA is a medium caliber artery with diffuse moderate plaque in a bead like pattern. Left main is a large artery that gives rise to LAD and LCX arteries. Left main is a long artery with minimal calcified plaque and stenosis 0-25%. LAD is a large vessel that gives rise to two diagonal arteries. There is mild diffuse calcified plaque with focal stenoses in the proximal and mid LAD of 25-49%. D1 is very small. D2 is a medium caliber artery (2.1 mm) that has severe mixed ostial plaque with lipid rich core and is suspicious for stenosis > 70%. LCX is a large caliber non-dominant artery that gives rise to two OM branches. Proximal LCX artery has severe non-calcified plaque suspicious for stenosis > 70%, this is followed by a moderate diffuse calcified plaque with stenosis 50-69%. Mid and distal LCX artery has small lumen and mild diffuse plaque. OM1 is very small and has severe diffuse plaque. OM2 is a medium caliber artery with diffuse moderate plaque in a bead like pattern. Other findings: Normal pulmonary vein drainage into the left atrium. Normal left atrial appendage without a thrombus. Normal size of the pulmonary artery. IMPRESSION: 1. Coronary calcium score of 1201. This was 95 percentile for age and sex matched  control. 2. Normal coronary origin with right dominance. 3. CAD-RADS 4 Severe stenosis. (70-99%). Diffuse CAD in a bead-like diabetic pattern, there is suspicion for severe stenoses in the mid RCA, ostial portion of a large D2 and proximal portion of LCX artery. Additional analysis with CT FFR will be submitted. We will tentatively plan for a left cardiac catheterization for tomorrow. Electronically Signed   By: Tobias Alexander   On: 08/14/2020 14:20   Result Date: 08/14/2020 EXAM: Gaye Alken  INTERPRETATION  CT CHEST The following report is an over-read performed by radiologist Dr. Genevive Bi of [facility] on 08/14/2020. This over-read does not include interpretation of cardiac or coronary anatomy or pathology. The coronary CTA interpretation by the cardiologist is attached. COMPARISON:  CT a 08/11/2020 FINDINGS: Limited view of the lung parenchyma demonstrates mild LEFT basilar atelectasis pleural thickening with calcifications. Airways are normal. Limited view of the mediastinum demonstrates no adenopathy. Esophagus normal. Limited view of the upper abdomen unremarkable. Limited view of the skeleton and chest wall is unremarkable. IMPRESSION: Pleural thickening and calcifications at the LEFT lung base. No change from prior Electronically Signed: By: Genevive Bi M.D. On: 08/14/2020 12:05   CT CORONARY FRACTIONAL FLOW RESERVE FLUID ANALYSIS  Result Date: 08/14/2020 EXAM: CT FFR ANALYSIS CLINICAL DATA:  64 year old male with diabetes, chest pain and abnormal coronary CTA. FINDINGS: FFRct analysis was performed on the original cardiac CT angiogram dataset. Diagrammatic representation of the FFRct analysis is provided in a separate PDF document in PACS. This dictation was created using the PDF document and an interactive 3D model of the results. 3D model is not available in the EMR/PACS. Normal FFR range is >0.80. 1. Left Main: 0.98. 2. LAD: Proximal: 0.97, distal: 0.87. 3. D1: Proximal:  0.75. 4. LCX: Proximal; 0.94, mid: Occluded. 5. RCA: Proximal: 0.98, distal: 0.77. IMPRESSION: 1. CT FFR analysis showed significant stenoses in proximal RCA, ostial portion of a large D1 and occluded proximal portion of LCX artery. Left cardiac catheterization is recommended. Electronically Signed   By: Tobias Alexander   On: 08/14/2020 17:41   ECHOCARDIOGRAM COMPLETE  Result Date: 08/13/2020    ECHOCARDIOGRAM REPORT   Patient Name:   Brandon [name] Date of Exam: 08/13/2020 Medical Rec #:  [id number]      Height:       69.0 in Accession #:    [id number]     Weight:       212.2 lb Date of Birth:  1956/08/08      BSA:          2.119 m Patient Age:    64 years       BP:           154/79 mmHg Patient Gender: M              HR:           92 bpm. Exam Location:  Inpatient Procedure: 2D Echo Indications:    chest pain  History:        Patient has no prior history of Echocardiogram examinations.                 Signs/Symptoms:Chest Pain; Risk Factors:Hypertension,                 Dyslipidemia and Diabetes.  Sonographer:    Delcie Roch Referring Phys: [id number] PING T ZHANG IMPRESSIONS  1. Left ventricular ejection fraction, by estimation, is 60 to 65%. The left ventricle has normal function. The left ventricle has no regional wall motion abnormalities. There is mild left ventricular hypertrophy. Left ventricular diastolic parameters are consistent with Grade I diastolic dysfunction (impaired relaxation).  2. Right ventricular systolic function is normal. The right ventricular size is normal.  3. The mitral valve is normal in structure. No evidence of mitral valve regurgitation. No evidence of mitral stenosis.  4. The aortic valve is tricuspid. Aortic valve regurgitation is not visualized. No aortic stenosis is present.  5. The inferior  vena cava is normal in size with greater than 50% respiratory variability, suggesting right atrial pressure of 3 mmHg. FINDINGS  Left Ventricle: Left ventricular ejection fraction,  by estimation, is 60 to 65%. The left ventricle has normal function. The left ventricle has no regional wall motion abnormalities. The left ventricular internal cavity size was normal in size. There is  mild left ventricular hypertrophy. Left ventricular diastolic parameters are consistent with Grade I diastolic dysfunction (impaired relaxation). Normal left ventricular filling pressure. Right Ventricle: The right ventricular size is normal. No increase in right ventricular wall thickness. Right ventricular systolic function is normal. Left Atrium: Left atrial size was normal in size. Right Atrium: Right atrial size was normal in size. Pericardium: There is no evidence of pericardial effusion. Mitral Valve: The mitral valve is normal in structure. No evidence of mitral valve regurgitation. No evidence of mitral valve stenosis. Tricuspid Valve: The tricuspid valve is normal in structure. Tricuspid valve regurgitation is not demonstrated. No evidence of tricuspid stenosis. Aortic Valve: The aortic valve is tricuspid. Aortic valve regurgitation is not visualized. No aortic stenosis is present. Pulmonic Valve: The pulmonic valve was not well visualized. Pulmonic valve regurgitation is not visualized. No evidence of pulmonic stenosis. Aorta: The aortic root is normal in size and structure. Venous: The inferior vena cava is normal in size with greater than 50% respiratory variability, suggesting right atrial pressure of 3 mmHg. IAS/Shunts: No atrial level shunt detected by color flow Doppler.  LEFT VENTRICLE PLAX 2D LVIDd:         4.70 cm  Diastology LVIDs:         3.40 cm  LV e' medial:    6.20 cm/s LV PW:         1.10 cm  LV E/e' medial:  11.1 LV IVS:        0.90 cm  LV e' lateral:   9.46 cm/s LVOT diam:     2.20 cm  LV E/e' lateral: 7.3 LV SV:         65 LV SV Index:   31 LVOT Area:     3.80 cm  RIGHT VENTRICLE             IVC RV S prime:     12.80 cm/s  IVC diam: 1.60 cm TAPSE (M-mode): 1.7 cm LEFT ATRIUM              Index       RIGHT ATRIUM           Index LA diam:        4.00 cm 1.89 cm/m  RA Area:     12.50 cm LA Vol (A2C):   34.6 ml 16.33 ml/m RA Volume:   26.20 ml  12.37 ml/m LA Vol (A4C):   34.3 ml 16.19 ml/m LA Biplane Vol: 35.3 ml 16.66 ml/m  AORTIC VALVE LVOT Vmax:   107.41 cm/s LVOT Vmean:  69.396 cm/s LVOT VTI:    0.171 m  AORTA Ao Root diam: 3.20 cm Ao Asc diam:  3.20 cm MITRAL VALVE MV Area (PHT): 4.71 cm     SHUNTS MV Decel Time: 161 msec     Systemic VTI:  0.17 m MV E velocity: 69.00 cm/s   Systemic Diam: 2.20 cm MV A velocity: 105.00 cm/s MV E/A ratio:  0.66 Dina Rich MD Electronically signed by Dina Rich MD Signature Date/Time: 08/13/2020/11:35:37 AM    Final    Telemetry    08/15/20 NSR- Personally Reviewed  ECG    No new tracing as of 08/15/20- Personally Reviewed  Cardiac Studies   TTE: 08/14/2020 1. Left ventricular ejection fraction, by estimation, is 60 to 65%. The  left ventricle has normal function. The left ventricle has no regional  wall motion abnormalities. There is mild left ventricular hypertrophy.  Left ventricular diastolic parameters  are consistent with Grade I diastolic dysfunction (impaired relaxation).  2. Right ventricular systolic function is normal. The right ventricular  size is normal.  3. The mitral valve is normal in structure. No evidence of mitral valve  regurgitation. No evidence of mitral stenosis.  4. The aortic valve is tricuspid. Aortic valve regurgitation is not  visualized. No aortic stenosis is present.  5. The inferior vena cava is normal in size with greater than 50%  respiratory variability, suggesting right atrial pressure of 3 mmHg.   Patient Profile     64 y.o. male with a hx of HTN,DM, HLD, diabetic gastroparesis and anxietywho is being seen today for the evaluation of chest painat the request ofDr. Jomarie Longs.  Assessment & Plan    1. Chest pain with CAD per cCTA 08/14/20: -Pt presented with chest pain and  underwent coronary CTA 08/14/20 which was abnormal with CT FFR showing significant stenosis in the pRCA, ostial D1 and occluded pLCx -Plan is for LHC today  -Denies recurrent angina  -Continue ASA 81, metoprolol 25, pravastatin 40 -LDL on 08/13/20>>756 -HbA1c, 10.1  2. Hypertension -Stable, 119/70>128/69>119/75 -Continue amlodipine 10 mg daily, metoprolol 25  3.  Uncontrolled DM2: -HbA1c, 10.1 this admission  -SSI while inpatient -Management per IM    Signed, Georgie Chard NP-C [facility] Pager: [phone] 08/15/2020, 10:13 AM     For questions or updates, please contact   Please consult [email or web address] for contact info under Cardiology/STEMI.  The patient was seen, examined and discussed with Georgie Chard, NP  and I agree with the above.   Coronary CTA showed 3 vessel diffuse CAD in a diabetic pattern, he is awaiting cath today, he will most probably need 3 vessel CABG. BP and ZHR at goal, Crea normal, on ASA and atorvastatin, metoprolol. LVEF 60-65%. He will need referral to an endocrinologist after discharge, HbA1c 10%.   Tobias Alexander, MD 08/15/2020

## 2020-08-15 NOTE — Progress Notes (Signed)
Marjie Skiff PA at bedside. Patient still c/o pain at 8 out of 10. B/P 107/57, 77, Sats 95% on 2 L.

## 2020-08-15 NOTE — Progress Notes (Signed)
I was called urgently to the bedside and presented immediately.  Patient was complaining of 8 out of 10 chest pain.  He underwent coronary angiography today with the following results:  Mid RCA lesion is 90% stenosed.  Prox Cx lesion is 75% stenosed.  2nd Mrg lesion is 75% stenosed.  1st Mrg lesion is 100% stenosed. Faint right to left and left to left collaterals.  RPDA lesion is 70% stenosed.  2nd Diag-1 lesion is 75% stenosed.  2nd Diag-2 lesion is 75% stenosed.  Dist LAD lesion is 50% stenosed.  A drug-eluting stent was successfully placed using a STENT RESOLUTE ONYX 4.0X38, optimized with IVUS.  Post intervention, there is a 0% residual stenosis.  LV end diastolic pressure is normal.  There is no aortic valve stenosis.  75 cm long sheath needed for support due to tortuosity in the right subclavian to engage the RCA. Left coronary could be engaged easily with short sheath.   Diffuse multivessel disease, but no LAD involvement.     RCA successfully treated.  Small vessel disease noted in the RPDA, OM1 and OM2, second diagonal and apical LAD.  Proximal circumflex would be a potential target for PCI if he had more symptoms.    Intensify medical therapy.    He underwent stent to the RCA with noted small vessel disease in the RPDA, OM1, OM 2, second diagonal, apical LAD.  Proximal circumflex was felt to be a target if PCI was needed for recurrent symptoms.  At the bedside the patient appeared warm and dry.  No diaphoresis, not in extremis.  Blood pressure 118/66, heart rate within normal limits.  Patient states that his chest pain is different than his presenting chest pain which led to coronary angiography with PCI.  He has mild increase in symptoms with deep inspiration.  No significant change with position change.  I performed a bedside echocardiogram to evaluate for pericardial effusion, tamponade physiology, and overall assessment of EF and wall motion.  LVEF is  approximately 55 to 60%.  Wall motion appears grossly normal, specifically there is no worrisome hypokinesis of the inferior wall after RCA stent.  RV function is normal, normal RV size.  Color Doppler and valve assessment not performed on this limited echocardiogram at the bedside.  No pericardial effusion seen, no concern for tamponade physiology.  He does have a epicardial fat layer noted in multiple views.  Patient's blood pressure has been hypotensive since receiving 2 nitroglycerin.  He is slowly having relief of chest pain, but is currently in 6 out of 10 pain.  We will provide narcotic pain medication with morphine 2 mg IV if needed for worsening chest pain.  Could consider initiating a nitroglycerin infusion however this can be stopped if he becomes hypotensive again.  Could also consider initiating colchicine which we will do today for possible postprocedural pericardial pain.  Will initiate colchicine 0.6 mg twice daily and can evaluate for effect on chest pain.  CRITICAL CARE Performed by: Weston Brass, MD   Total critical care time: 35 minutes   Critical care time was exclusive of separately billable procedures and treating other patients.   Critical care was necessary to treat or prevent imminent or life-threatening deterioration.   Critical care was time spent personally by me (independent of APPs or residents) on the following activities: development of treatment plan with patient and/or surrogate as well as nursing, discussions with consultants, evaluation of patient's response to treatment, examination of patient, obtaining history from patient or  surrogate, ordering and performing treatments and interventions, ordering and review of laboratory studies, ordering and review of radiographic studies, pulse oximetry and re-evaluation of patient's condition.

## 2020-08-15 NOTE — Progress Notes (Signed)
12 lead EKG completed.

## 2020-08-16 ENCOUNTER — Encounter (HOSPITAL_COMMUNITY): Payer: Self-pay | Admitting: Interventional Cardiology

## 2020-08-16 ENCOUNTER — Telehealth: Payer: Self-pay | Admitting: Physician Assistant

## 2020-08-16 ENCOUNTER — Ambulatory Visit: Payer: Medicare Other | Admitting: Medical

## 2020-08-16 DIAGNOSIS — E782 Mixed hyperlipidemia: Secondary | ICD-10-CM

## 2020-08-16 DIAGNOSIS — Z955 Presence of coronary angioplasty implant and graft: Secondary | ICD-10-CM

## 2020-08-16 DIAGNOSIS — I25110 Atherosclerotic heart disease of native coronary artery with unstable angina pectoris: Principal | ICD-10-CM

## 2020-08-16 LAB — CBC
HCT: 43.8 % (ref 39.0–52.0)
Hemoglobin: 14.3 g/dL (ref 13.0–17.0)
MCH: 28.9 pg (ref 26.0–34.0)
MCHC: 32.6 g/dL (ref 30.0–36.0)
MCV: 88.7 fL (ref 80.0–100.0)
Platelets: 246 10*3/uL (ref 150–400)
RBC: 4.94 MIL/uL (ref 4.22–5.81)
RDW: 16.5 % — ABNORMAL HIGH (ref 11.5–15.5)
WBC: 5.9 10*3/uL (ref 4.0–10.5)
nRBC: 0 % (ref 0.0–0.2)

## 2020-08-16 LAB — BASIC METABOLIC PANEL
Anion gap: 9 (ref 5–15)
BUN: 9 mg/dL (ref 8–23)
CO2: 23 mmol/L (ref 22–32)
Calcium: 8.5 mg/dL — ABNORMAL LOW (ref 8.9–10.3)
Chloride: 104 mmol/L (ref 98–111)
Creatinine, Ser: 0.72 mg/dL (ref 0.61–1.24)
GFR, Estimated: >60 mL/min (ref >60)
Glucose, Bld: 138 mg/dL — ABNORMAL HIGH (ref 70–99)
Potassium: 3.7 mmol/L (ref 3.5–5.1)
Sodium: 136 mmol/L (ref 135–145)

## 2020-08-16 LAB — GLUCOSE, CAPILLARY
Glucose-Capillary: 151 mg/dL — ABNORMAL HIGH (ref 70–99)
Glucose-Capillary: 135 mg/dL — ABNORMAL HIGH (ref 70–99)

## 2020-08-16 MED ORDER — ATORVASTATIN CALCIUM 80 MG PO TABS: 80.0000 mg | ORAL_TABLET | Freq: Every day | ORAL | Status: DC

## 2020-08-16 MED ORDER — ATORVASTATIN CALCIUM 80 MG PO TABS: 80.0000 mg | ORAL_TABLET | Freq: Every day | ORAL | 0 refills | Status: DC

## 2020-08-16 MED ORDER — NITROGLYCERIN 0.4 MG SL SUBL: 0.4000 mg | SUBLINGUAL_TABLET | SUBLINGUAL | 0 refills | Status: DC | PRN

## 2020-08-16 MED ORDER — METOPROLOL TARTRATE 25 MG PO TABS: 25.0000 mg | ORAL_TABLET | Freq: Two times a day (BID) | ORAL | 0 refills | Status: DC

## 2020-08-16 MED ORDER — CLOPIDOGREL BISULFATE 75 MG PO TABS: 75.0000 mg | ORAL_TABLET | Freq: Every day | ORAL | 0 refills | Status: DC

## 2020-08-16 MED FILL — PIOGLITAZONE HCL 30 MG TAB: 30 | 90 days supply | Qty: 90 | Fill #0

## 2020-08-16 NOTE — Care Management (Signed)
1258 08-16-20 Benefits check submitted for colchicine. Case Manager will follow for cost Graves-Bigelow, Lamar Laundry, RN,BSN Case Manager

## 2020-08-16 NOTE — Progress Notes (Signed)
Progress Note  Patient Name: Brandon [name] Date of Encounter: 08/16/2020  Primary Cardiologist: New patient/Dr Shari ProwsPemberton  Subjective   Pt had recurrent chest pain yesterday evening post intervention. Bedside echocardiogram performed with no change in LV function or acute change. He was briefly started on colchicine however this was discontinued due to no recurent symptoms. Pt wants to go home  Inpatient Medications    Scheduled Meds: . amLODipine  10 mg Oral Daily  . ARIPiprazole  10 mg Oral Daily  . aspirin  81 mg Oral Daily  . buPROPion  450 mg Oral Daily  . clopidogrel  75 mg Oral Q breakfast  . colchicine  0.6 mg Oral BID  . empagliflozin  25 mg Oral Daily  . insulin aspart  0-15 Units Subcutaneous TID WC  . insulin glargine  45 Units Subcutaneous QHS  . lidocaine  1 patch Transdermal Q24H  . metoprolol tartrate  25 mg Oral BID  . pantoprazole  40 mg Oral Daily  . pioglitazone  30 mg Oral Daily  . pravastatin  40 mg Oral QHS  . sodium chloride flush  3 mL Intravenous Q12H  . sodium chloride flush  3 mL Intravenous Q12H  . tamsulosin  0.4 mg Oral Daily   Continuous Infusions: . sodium chloride     PRN Meds: sodium chloride, acetaminophen, ALPRAZolam, metoCLOPramide, morphine injection, nitroGLYCERIN, ondansetron (ZOFRAN) IV, sodium chloride flush, tiZANidine, traMADol, zolpidem   Vital Signs    Vitals:   08/15/20 1709 08/15/20 1823 08/15/20 1900 08/16/20 0500  BP: 116/67 119/64 (!) 115/59 135/69  Pulse: 72 75 73 70  Resp: 16 16 18 18   Temp:   97.7 F (36.5 C) 97.8 F (36.6 C)  TempSrc:   Oral Oral  SpO2: 94% 95% 93% 94%  Weight:    97 kg  Height:        Intake/Output Summary (Last 24 hours) at 08/16/2020 1133 Last data filed at 08/15/2020 2230 Gross per 24 hour  Intake 1787.75 ml  Output 700 ml  Net 1087.75 ml   Filed Weights   08/13/20 0505 08/14/20 0342 08/16/20 0500  Weight: 96.3 kg 97.4 kg 97 kg    Physical Exam   General: Well  developed, well nourished, NAD Neck: Negative for carotid bruits. No JVD Lungs:Clear to ausculation bilaterally. No wheezes, rales, or rhonchi. Breathing is unlabored. Cardiovascular: RRR with S1 S2. No murmurs Abdomen: Soft, non-tender, non-distended. No obvious abdominal masses. Extremities: No edema. Radial pulses 2+ bilaterally Neuro: Alert and oriented. No focal deficits. No facial asymmetry. MAE spontaneously. Psych: Responds to questions appropriately with normal affect.    Labs    Chemistry Recent Labs  Lab 08/11/20 1415 08/12/20 0033 08/15/20 0515 08/16/20 0235  NA 136  --  139 136  K 3.2*  --  3.7 3.7  CL 101  --  101 104  CO2 22  --  27 23  GLUCOSE 217*  --  78 138*  BUN 7*  --  14 9  CREATININE 0.86  --  0.88 0.72  CALCIUM 8.9  --  9.0 8.5*  PROT  --  7.0  --   --   ALBUMIN  --  3.8  --   --   AST  --  24  --   --   ALT  --  14  --   --   ALKPHOS  --  65  --   --   BILITOT  --  0.9  --   --  GFRNONAA >60  --  >60 >60  ANIONGAP 13  --  11 9     Hematology Recent Labs  Lab 08/11/20 1415 08/15/20 0515 08/16/20 0235  WBC 6.2 7.8 5.9  RBC 5.52 5.61 4.94  HGB 15.8 15.6 14.3  HCT 47.6 49.6 43.8  MCV 86.2 88.4 88.7  MCH 28.6 27.8 28.9  MCHC 33.2 31.5 32.6  RDW 16.6* 16.7* 16.5*  PLT 232 262 246    Cardiac EnzymesNo results for input(s): TROPONINI in the last 168 hours. No results for input(s): TROPIPOC in the last 168 hours.   BNPNo results for input(s): BNP, PROBNP in the last 168 hours.   DDimer  Recent Labs  Lab 08/11/20 1623  DDIMER 0.31     Radiology    CARDIAC CATHETERIZATION  Result Date: 08/15/2020  Mid RCA lesion is 90% stenosed.  Prox Cx lesion is 75% stenosed.  2nd Mrg lesion is 75% stenosed.  1st Mrg lesion is 100% stenosed. Faint right to left and left to left collaterals.  RPDA lesion is 70% stenosed.  2nd Diag-1 lesion is 75% stenosed.  2nd Diag-2 lesion is 75% stenosed.  Dist LAD lesion is 50% stenosed.  A drug-eluting  stent was successfully placed using a STENT RESOLUTE ONYX 4.0X38, optimized with IVUS.  Post intervention, there is a 0% residual stenosis.  LV end diastolic pressure is normal.  There is no aortic valve stenosis.  75 cm long sheath needed for support due to tortuosity in the right subclavian to engage the RCA. Left coronary could be engaged easily with short sheath.  Diffuse multivessel disease, but no LAD involvement.  RCA successfully treated.  Small vessel disease noted in the RPDA, OM1 and OM2, second diagonal and apical LAD.  Proximal circumflex would be a potential target for PCI if he had more symptoms. Intensify medical therapy.    CT CORONARY MORPH W/CTA COR W/SCORE W/CA W/CM &/OR WO/CM  Addendum Date: 08/14/2020   ADDENDUM REPORT: 08/14/2020 14:20 CLINICAL DATA:  64 year old male with h/o DM, hypertension, hyperlipidemia, former smoker who presented with chest pain. EXAM: Cardiac/Coronary  CTA TECHNIQUE: The patient was scanned on a [employer]. FINDINGS: A 100 kV prospective scan was triggered in the descending thoracic aorta at 111 HU's. Axial non-contrast 3 mm slices were carried out through the heart. The data set was analyzed on a dedicated work station and scored using the Agatson method. Gantry rotation speed was 250 msecs and collimation was .6 mm. 100 mg of PO Metoprolol and 0.8 mg of sl NTG were given. The 3D data set was reconstructed in 5% intervals of the 67-82 % of the R-R cycle. Diastolic phases were analyzed on a dedicated work station using MPR, MIP and VRT modes. The patient received 80 cc of contrast. Aorta: Normal size. Mild diffuse atherosclerotic plaque and calcifications. No dissection. Aortic Valve:  Trileaflet.  No calcifications. Coronary Arteries:  Normal coronary origin.  Right dominance. RCA is a large dominant artery that gives rise to PDA and PLA. There is mild calcified plaque in the proximal RCA with stenosis 25-49%. Mid RCA has severe diffuse  predominantly calcified plaque with a focal stenosis suspicious for > 70%. Distal RCA has moderate calcified plaque prior to bifurcation into PLA and PDA with stenosis 50-69%. PLA is small with diffuse moderate plaque. PDA is a medium caliber artery with diffuse moderate plaque in a bead like pattern. Left main is a large artery that gives rise to LAD and LCX arteries. Left main  is a long artery with minimal calcified plaque and stenosis 0-25%. LAD is a large vessel that gives rise to two diagonal arteries. There is mild diffuse calcified plaque with focal stenoses in the proximal and mid LAD of 25-49%. D1 is very small. D2 is a medium caliber artery (2.1 mm) that has severe mixed ostial plaque with lipid rich core and is suspicious for stenosis > 70%. LCX is a large caliber non-dominant artery that gives rise to two OM branches. Proximal LCX artery has severe non-calcified plaque suspicious for stenosis > 70%, this is followed by a moderate diffuse calcified plaque with stenosis 50-69%. Mid and distal LCX artery has small lumen and mild diffuse plaque. OM1 is very small and has severe diffuse plaque. OM2 is a medium caliber artery with diffuse moderate plaque in a bead like pattern. Other findings: Normal pulmonary vein drainage into the left atrium. Normal left atrial appendage without a thrombus. Normal size of the pulmonary artery. IMPRESSION: 1. Coronary calcium score of 1201. This was 95 percentile for age and sex matched control. 2. Normal coronary origin with right dominance. 3. CAD-RADS 4 Severe stenosis. (70-99%). Diffuse CAD in a bead-like diabetic pattern, there is suspicion for severe stenoses in the mid RCA, ostial portion of a large D2 and proximal portion of LCX artery. Additional analysis with CT FFR will be submitted. We will tentatively plan for a left cardiac catheterization for tomorrow. Electronically Signed   By: Tobias Alexander   On: 08/14/2020 14:20   Result Date: 08/14/2020 EXAM:  OVER-READ INTERPRETATION  CT CHEST The following report is an over-read performed by radiologist Dr. Genevive Bi of [facility] on 08/14/2020. This over-read does not include interpretation of cardiac or coronary anatomy or pathology. The coronary CTA interpretation by the cardiologist is attached. COMPARISON:  CT a 08/11/2020 FINDINGS: Limited view of the lung parenchyma demonstrates mild LEFT basilar atelectasis pleural thickening with calcifications. Airways are normal. Limited view of the mediastinum demonstrates no adenopathy. Esophagus normal. Limited view of the upper abdomen unremarkable. Limited view of the skeleton and chest wall is unremarkable. IMPRESSION: Pleural thickening and calcifications at the LEFT lung base. No change from prior Electronically Signed: By: Genevive Bi M.D. On: 08/14/2020 12:05   CT CORONARY FRACTIONAL FLOW RESERVE FLUID ANALYSIS  Result Date: 08/14/2020 EXAM: CT FFR ANALYSIS CLINICAL DATA:  64 year old male with diabetes, chest pain and abnormal coronary CTA. FINDINGS: FFRct analysis was performed on the original cardiac CT angiogram dataset. Diagrammatic representation of the FFRct analysis is provided in a separate PDF document in PACS. This dictation was created using the PDF document and an interactive 3D model of the results. 3D model is not available in the EMR/PACS. Normal FFR range is >0.80. 1. Left Main: 0.98. 2. LAD: Proximal: 0.97, distal: 0.87. 3. D1: Proximal: 0.75. 4. LCX: Proximal; 0.94, mid: Occluded. 5. RCA: Proximal: 0.98, distal: 0.77. IMPRESSION: 1. CT FFR analysis showed significant stenoses in proximal RCA, ostial portion of a large D1 and occluded proximal portion of LCX artery. Left cardiac catheterization is recommended. Electronically Signed   By: Tobias Alexander   On: 08/14/2020 17:41   Telemetry    08/16/20 NSR- Personally Reviewed  ECG    No new tracing as of 08/16/20- Personally Reviewed  Cardiac Studies   TTE:  08/14/2020 1. Left ventricular ejection fraction, by estimation, is 60 to 65%. The  left ventricle has normal function. The left ventricle has no regional  wall motion abnormalities. There is mild  left ventricular hypertrophy.  Left ventricular diastolic parameters  are consistent with Grade I diastolic dysfunction (impaired relaxation).  2. Right ventricular systolic function is normal. The right ventricular  size is normal.  3. The mitral valve is normal in structure. No evidence of mitral valve  regurgitation. No evidence of mitral stenosis.  4. The aortic valve is tricuspid. Aortic valve regurgitation is not  visualized. No aortic stenosis is present.  5. The inferior vena cava is normal in size with greater than 50%  respiratory variability, suggesting right atrial pressure of 3 mmHg.   LHC 08/15/20:     Mid RCA lesion is 90% stenosed.  Prox Cx lesion is 75% stenosed.  2nd Mrg lesion is 75% stenosed.  1st Mrg lesion is 100% stenosed. Faint right to left and left to left collaterals.  RPDA lesion is 70% stenosed.  2nd Diag-1 lesion is 75% stenosed.  2nd Diag-2 lesion is 75% stenosed.  Dist LAD lesion is 50% stenosed.  A drug-eluting stent was successfully placed using a STENT RESOLUTE ONYX 4.0X38, optimized with IVUS.  Post intervention, there is a 0% residual stenosis.  LV end diastolic pressure is normal.  There is no aortic valve stenosis.  75 cm long sheath needed for support due to tortuosity in the right subclavian to engage the RCA. Left coronary could be engaged easily with short sheath.   Diffuse multivessel disease, but no LAD involvement.    RCA successfully treated.  Small vessel disease noted in the RPDA, OM1 and OM2, second diagonal and apical LAD.  Proximal circumflex would be a potential target for PCI if he had more symptoms.   Intensify medical therapy.    Diagnostic Dominance: Right    Intervention      Patient Profile      64 y.o. male with a hx of HTN,DM, HLD, diabetic gastroparesis and anxietywho is being seen today for the evaluation of chest painat the request ofDr. Jomarie Longs.  Assessment & Plan    1. Severe CAD per cCTA 08/14/20>>now s/p PCI to RCA with significant residual disease: -Pt presented with chest pain and underwent coronary CTA 08/14/20 which was abnormal with CT FFR showing significant stenosis in the pRCA, ostial D1 and occluded pLCx -LHC showed a 90% mRCA, 75% pLCX, 75% 2nd Marg, 100% 1st Marg with collaterals, 2nd Diag at 75%, 2nd Diag lesion #2 75%, dLAD at 50%. A PCI/DES was placed to the pRCA lesion. Other lesions in RPDA, OM1 and OM2, second diagonal and apical LAD felt to be too small for intervention. Plan for pLCx PCI if recurrent symptoms.  -DAPT with ASA and Plavix for at least 1 year without interruption  -Continue ASA 81, metoprolol 25, pravastatin 40>>change to high intensity Lipitor  -Needs to be discharged with SL NTG  -Briefly on colchicine due to recurrent chest pain post intervention however this was d/c given no recurrent symptoms   -LDL on 08/13/20>>76>>chanmge statin to high intensity Lipitor  -HbA1c, 10.1>>per IM   2. Hypertension -Stable, 135/69>115/59>119/64 -Continue amlodipine 10 mg daily, metoprolol 25  3.Uncontrolled DM2: -HbA1c, 10.1 this admission  -SSI while inpatient -Management per IM    Signed, Georgie Chard NP-C [facility] Pager: [phone] 08/16/2020, 11:33 AM     For questions or updates, please contact   Please consult [email or web address] for contact info under Cardiology/STEMI.  The patient was seen, examined and discussed with Georgie Chard, NP  and I agree with the above.   Coronary CTA showed 3 vessel diffuse CAD in a  diabetic pattern, he underwent cardiac cath yesterday with findings of Diffuse multivessel disease, but no LAD involvement.  RCA was successfully treated.  Small vessel disease noted in the RPDA, OM1 and OM2, second  diagonal and apical LAD.  Proximal circumflex would be a potential target for PCI if he had more symptoms.   The patient developed acute chest pain last night, that has resolved overnight he is asymptomatic right now his access site in the right radial artery looks good there is no bruit no bleeding.  He is ready to go home he is given all the information, with our clinic number and informed that he should call us or come back to the ER if he develops any more chest pain.  If he has ongoing chest pain we would consider intervention on proximal circumflex artery in a staged procedure. He is also advised to follow with endocrinology as his hemoglobin A1c is over 10%. Continue high-dose atorvastatin, aspirin, Plavix and metoprolol. We will arrange for outpatient follow-up.  Tobias Alexander, MD 08/16/2020

## 2020-08-16 NOTE — Telephone Encounter (Signed)
Currently admitted.

## 2020-08-16 NOTE — Progress Notes (Signed)
CARDIAC REHAB PHASE I   PRE:  Rate/Rhythm: 95 SR  BP:  Supine:   Sitting: 146/72  Standing:    SaO2: 97%RA  MODE:  Ambulation: 650 ft   POST:  Rate/Rhythm: 84 SR  BP:  Supine:   Sitting: 145/77  Standing:    SaO2: 96%RA 0907-1007 Pt walked 650 ft on RA with steady gait and no CP. Stated he feels well this morning. Education completed with pt who voiced understanding. Stressed importance of plavix with stent. Reviewed NTG use, heart healthy and low carb food choices, walking for exercise, and CRP 2. Referral to [place] program done. Pt used to work here in Camera operator dept years ago. Pt stated wife works at [employer]. Will consider CRP 2.   Luetta Nutting, RN BSN  08/16/2020 10:00 AM

## 2020-08-16 NOTE — Telephone Encounter (Signed)
TOC Patient- Please call Patient- Pt have an appt with Azalee Course on 08-31-19

## 2020-08-16 NOTE — Progress Notes (Signed)
PT Cancellation Note  Patient Details Name: Brandon [name] MRN: [id number] DOB: Mar 12, 1956   Cancelled Treatment:    Reason Eval/Treat Not Completed: PT screened, no needs identified, will sign off Pt currently ambulating with cardiac rehab with no deficits noted. No acute PT needs at this time. Will sign off. If needs change, please re-consult.   Farley Ly, PT, DPT  Acute Rehabilitation Services  Pager: [phone] Office: [phone]    [name] 08/16/2020, 9:25 AM

## 2020-08-16 NOTE — Telephone Encounter (Signed)
Patient's wife is having some paperwork faxed to our office that clears her to stay out of work to assist with her husband post heart cath. It clears her from 08/12/20 - 08/23/20. She states that [employer], she works for [employer], is faxing it to [address] office but wanted to be sure our office receives it. Please contact her once received to keep her updated.

## 2020-08-16 NOTE — Discharge Instructions (Signed)
Chest Wall Pain Chest wall pain is pain in or around the bones and muscles of your chest. Chest wall pain may be caused by:  An injury.  Coughing a lot.  Using your chest and arm muscles too much. Sometimes, the cause may not be known. This pain may take a few weeks or longer to get better. Follow these instructions at home: Managing pain, stiffness, and swelling If told, put ice on the painful area:  Put ice in a plastic bag.  Place a towel between your skin and the bag.  Leave the ice on for 20 minutes, 2-3 times a day.  Activity  Rest as told by your doctor.  Avoid doing things that cause pain. This includes lifting heavy items.  Ask your doctor what activities are safe for you. General instructions   Take over-the-counter and prescription medicines only as told by your doctor.  Do not use any products that contain nicotine or tobacco, such as cigarettes, e-cigarettes, and chewing tobacco. If you need help quitting, ask your doctor.  Keep all follow-up visits as told by your doctor. This is important. Contact a doctor if:  You have a fever.  Your chest pain gets worse.  You have new symptoms. Get help right away if:  You feel sick to your stomach (nauseous) or you throw up (vomit).  You feel sweaty or light-headed.  You have a cough with mucus from your lungs (sputum) or you cough up blood.  You are short of breath. These symptoms may be an emergency. Do not wait to see if the symptoms will go away. Get medical help right away. Call your local emergency services (911 in the [place] Do not drive yourself to the hospital. Summary  Chest wall pain is pain in or around the bones and muscles of your chest.  It may be treated with ice, rest, and medicines. Your condition may also get better if you avoid doing things that cause pain.  Contact a doctor if you have a fever, chest pain that gets worse, or new symptoms.  Get help right away if you feel  light-headed or you get short of breath. These symptoms may be an emergency. This information is not intended to replace advice given to you by your health care provider. Make sure you discuss any questions you have with your health care provider. Document Revised: 02/12/2018 Document Reviewed: 02/12/2018 Elsevier Patient Education  2020 [employer]. Information about your medication: Plavix (anti-platelet agent)  Generic Name (Brand): clopidogrel (Plavix), once daily medication  PURPOSE: You are taking this medication along with aspirin to lower your chance of having a heart attack, stroke, or blood clots in your heart stent. These can be fatal. Plavix and aspirin help prevent platelets from sticking together and forming a clot that can block an artery or your stent.   Common SIDE EFFECTS you may experience include: bruising or bleeding more easily, shortness of breath  Do not stop taking PLAVIX without talking to the doctor who prescribes it for you. People who are treated with a stent and stop taking Plavix too soon, have a higher risk of getting a blood clot in the stent, having a heart attack, or dying. If you stop Plavix because of bleeding, or for other reasons, your risk of a heart attack or stroke may increase.   Avoid taking NSAID agents or anti-inflammatory medications such as ibuprofen, naproxen given increased bleed risk with plavix - can use acetaminophen (Tylenol) if needed for pain.  Avoid taking over the counter stomach medications omeprazole (Prilosec) or esomeprazole (Nexium) since these do interact and make plavix less effective - ask your pharmacist or doctor for alterative agents if needed for heartburn or GERD.   Tell all of your doctors and dentists that you are taking Plavix. They should talk to the doctor who prescribed Plavix for you before you have any surgery or invasive procedure.   Contact your health care provider if you experience: severe or uncontrollable  bleeding, pink/red/brown urine, vomiting blood or vomit that looks like "coffee grounds", red or black stools (looks like tar), coughing up blood or blood clots ----------------------------------------------------------------------------------------------------------------------

## 2020-08-16 NOTE — Progress Notes (Signed)
OT Cancellation Note  Patient Details Name: Brandon [name] MRN: [id number] DOB: 05/08/1956   Cancelled Treatment:    Reason Eval/Treat Not Completed: OT screened, no needs identified, will sign off. Pt ambulating with cardiac rehab and Ind with ADLs/selfcare  Galen Manila 08/16/2020, 12:13 PM

## 2020-08-16 NOTE — TOC Benefit Eligibility Note (Signed)
Transition of Care [facility]) Benefit Eligibility Note    Patient Details  Name: Brandon [name] MRN: [id number] Date of Birth: 21-Nov-1955   Medication/Dose: Colchicine tab. 0.6mg .bid 30 day supply  Covered?: Yes  Tier:  (Tier 1)  Prescription Coverage Preferred Pharmacy: [facility] and [facility]  Spoke with Person/Company/Phone Number:: [employer] [phone]  Co-Pay: $5.00  Prior Approval: No  Deductible:  (no deductible)       Renie Ora Phone Number: 08/16/2020, 1:54 PM

## 2020-08-16 NOTE — Consult Note (Signed)
[facility] Inpatient Consult   08/16/2020  SEBASTYAN PINKHASOV 01-31-1956 [id number]    [facility] [[facility]]  Accountable Care Organization [ACO] Patient: [place] plan  Patient is currently assigned to a [facility] Care Manager Coordinator for chronic disease management services and for post hospital resource support.   Patient will receive a post hospital call and will be evaluated for assessments and disease process education.       Plan: Patient will be followed by [facility] RN Care Coordinator.   For additional questions or referrals please contact:   Charlesetta Shanks, RN BSN CCM [facility]  [phone] business mobile phone Toll free office [phone]  Fax number: [phone] [email or web address]

## 2020-08-16 NOTE — Discharge Summary (Signed)
Physician Discharge Summary  Brandon [name] Brandon [name] DOB: Feb 10, 1956 DOA: 08/11/2020  PCP: Everrett Coombe, DO  Admit date: 08/11/2020 Discharge date: 08/16/2020  Admitted From: Home  Disposition: Home  Recommendations for Outpatient Follow-up:  1. Follow up with PCP in 1-2 weeks 2. Follow-up with cardiology on 08/30/2020 3. Please obtain BMP/CBC in one week 4. Please follow up with your PCP on the following pending results: Unresulted Labs (From admission, onward)         None       Home Health: None Equipment/Devices: None  Discharge Condition: Stable CODE STATUS: Full code Diet recommendation: Cardiac  Subjective: Patient seen and examined.  He is feeling just fine.  No chest pain or shortness of breath.  Requesting to discharge home  Brief/Interim Summary: 64 year old male with history of type 2 diabetes mellitus on insulin, hypertension, dyslipidemia, chronic back pain, gastroparesis, anxiety and depression presented to the ED with chest pain off and on since Wednesday 12/15. Work-up in the ED noted negative troponins, nonischemic EKG.  Admitted under hospitalist service. Cardiology consulted, coronary CTA concerning for three-vessel CAD, patient subsequently underwent cardiac cath showed a 90% mRCA, 75% pLCX, 75% 2nd Marg, 100% 1st Marg with collaterals, 2nd Diag at 75%, 2nd Diag lesion #2 75%, dLAD at 50%. A PCI/DES was placed to the pRCA lesion. Other lesions in RPDA, OM1 and OM2, second diagonal and apical LAD felt to be too small for intervention. Plan for pLCx PCI if recurrent symptoms.  Cardiology recommends DAPT with ASA and Plavix for at least 1 year without interruption .  He was also started on low-dose Lopressor.  He was also switched from lovastatin to atorvastatin 80 mg.  He is doing fine without having any symptoms.  No chest pain.  Hemodynamically stable.  He wants to go home.  I discussed with cardiology who has cleared him.  He is going to be discharged home in  stable condition  Discharge Diagnoses:  Active Problems:   Chest pain   Coronary artery disease involving native coronary artery of native heart with unstable angina pectoris [facility])    Discharge Instructions  Discharge Instructions    Amb Referral to Cardiac Rehabilitation   Complete by: As directed    Diagnosis: Coronary Stents   After initial evaluation and assessments completed: Virtual Based Care may be provided alone or in conjunction with Phase 2 Cardiac Rehab based on patient barriers.: Yes     Allergies as of 08/16/2020      Reactions   Kiwi Extract Swelling   Mouth swelling.   Other Swelling   EGGPLANT. Mouth swelling.   Penicillins Rash   Ancef [cefazolin] Rash   Latex Rash   Levaquin [levofloxacin] Rash      Medication List    STOP taking these medications   ibuprofen 800 MG tablet Commonly known as: ADVIL   lovastatin 40 MG 24 hr tablet Commonly known as: ALTOPREV   lovastatin 40 MG tablet Commonly known as: MEVACOR     TAKE these medications   amLODipine 10 MG tablet Commonly known as: NORVASC Take 10 mg by mouth every morning.   ARIPiprazole 10 MG tablet Commonly known as: ABILIFY Take 10 mg by mouth every morning.   aspirin 81 MG EC tablet Take 81 mg by mouth every morning.   atorvastatin 80 MG tablet Commonly known as: LIPITOR Take 1 tablet (80 mg total) by mouth daily.   buPROPion 300 MG 24 hr tablet Commonly known as: WELLBUTRIN XL Take 300  mg by mouth every morning. Take with a 150 mg tablet for a total dose of 450 mg daily   buPROPion 150 MG 24 hr tablet Commonly known as: WELLBUTRIN XL Take 150 mg by mouth every morning. Take with a 300 mg tablet for a total dose of 450 mg daily   clopidogrel 75 MG tablet Commonly known as: PLAVIX Take 1 tablet (75 mg total) by mouth daily with breakfast. Start taking on: August 17, 2020   empagliflozin 25 MG Tabs tablet Commonly known as: JARDIANCE Take 25 mg by mouth every morning.    insulin lispro 100 UNIT/ML KwikPen Commonly known as: HUMALOG Inject 10-24 Units into the skin 3 (three) times daily before meals. Per sliding scale - 1 unit per 7 carbs   metoCLOPramide 10 MG tablet Commonly known as: REGLAN Take 10 mg by mouth daily as needed for nausea or vomiting (upset stomach). PRN   metoprolol tartrate 25 MG tablet Commonly known as: LOPRESSOR Take 1 tablet (25 mg total) by mouth 2 (two) times daily.   nitroGLYCERIN 0.4 MG SL tablet Commonly known as: NITROSTAT Place 1 tablet (0.4 mg total) under the tongue every 5 (five) minutes as needed for chest pain.   ondansetron 4 MG disintegrating tablet Commonly known as: Zofran ODT Take 1 tablet (4 mg total) by mouth every 8 (eight) hours as needed for nausea or vomiting.   pantoprazole 40 MG tablet Commonly known as: PROTONIX Take 40 mg by mouth every morning.   pioglitazone 30 MG tablet Commonly known as: ACTOS Take 30 mg by mouth every morning.   tamsulosin 0.4 MG Caps capsule Commonly known as: FLOMAX Take 0.4 mg by mouth every morning.   Testosterone Cypionate 200 MG/ML Soln Inject 200 mg into the muscle every Sunday.   tiZANidine 4 MG tablet Commonly known as: ZANAFLEX Take 1 tablet (4 mg total) by mouth every 8 (eight) hours as needed for muscle spasms. What changed: when to take this   traMADol 50 MG tablet Commonly known as: ULTRAM Take 1 tablet (50 mg total) by mouth 3 (three) times daily as needed. What changed:   how much to take  when to take this   Tresiba FlexTouch 100 UNIT/ML FlexTouch Pen Generic drug: insulin degludec Inject 60 Units into the skin daily. What changed: when to take this   triazolam 0.25 MG tablet Commonly known as: HALCION 1-2 tabs PO 2 hours before procedure or imaging.  Do not drive with this medication. What changed:   how much to take  how to take this  when to take this  additional instructions       Follow-up Information    Azalee Course, [place]  Follow up on 08/30/2020.   Specialties: Cardiology, Radiology Why: at 11:15am. Please arrive to your appointment by 11:00am  Contact information: [address] [place] [address] [phone]        Everrett Coombe, DO Follow up in 1 week(s).   Specialty: Family Medicine Contact information: [address]  [address] [place] [address] [phone]              Allergies  Allergen Reactions  . Kiwi Extract Swelling    Mouth swelling.   . Other Swelling    EGGPLANT. Mouth swelling.  Marland Kitchen Penicillins Rash  . Ancef [Cefazolin] Rash  . Latex Rash  . Levaquin [Levofloxacin] Rash    Consultations: Cardiology   Procedures/Studies: DG Chest 2 View  Result Date: 08/11/2020 CLINICAL  DATA:  Chest pain EXAM: CHEST - 2 VIEW COMPARISON:  04/08/2020 FINDINGS: Cardiac and mediastinal contours normal. Pulmonary vascularity normal. Mild scarring in the left lung base unchanged. No acute infiltrate or effusion. Spinal cord stimulator unchanged. IMPRESSION: No active cardiopulmonary disease. Electronically Signed   By: Marlan Palau M.D.   On: 08/11/2020 14:51   DG Lumbar Spine Complete  Result Date: 07/26/2020 CLINICAL DATA:  Failed back syndrome with pain. EXAM: LUMBAR SPINE - COMPLETE 4+ VIEW COMPARISON:  CT of the abdomen and pelvis of April 14, 2020 FINDINGS: Post L4-5 spinal fusion with extensive spinal degenerative changes and spinal nerve stimulator in place as before. No sign of acute fracture. Power pack for nerve stimulator over the LEFT back as before. Signs of laminectomy at L4-5 similar to prior study along with interbody cage placement. IMPRESSION: Unchanged appearance of L4-5 spinal fusion. Power pack for spinal nerve stimulator obscures partially the construct on the lateral view. Degenerative changes elsewhere in the spine with similar appearance to the recent CT evaluation from April 14, 2020 Electronically Signed   By: Donzetta Kohut M.D.    On: 07/26/2020 16:26   DG Sacrum/Coccyx  Result Date: 07/26/2020 CLINICAL DATA:  Bilateral SI joint pain, interventional planning. Failed back surgery syndrome EXAM: SACRUM AND COCCYX - 2+ VIEW COMPARISON:  CT abdomen pelvis April 14, 2020. FINDINGS: There is no evidence of fracture or other focal bone lesions. Right greater than left degenerative change of the SI joints. Lower lumbar spondylosis. Mild degenerative changes bilateral hips. L4-L5 posterior spinal fusion hardware with interbody disc spacer. Spinal stimulator generator in the posterior subcutaneous soft tissues. Radiopaque penile pump prosthetics visualized. IMPRESSION: 1. Mild right greater than left degenerative change of the SI joints. 2. Mild degenerative changes of the bilateral hips. 3. L4-L5 posterior spinal fusion hardware with interbody disc spacer without evidence of hardware complication. Electronically Signed   By: Maudry Mayhew MD   On: 07/26/2020 16:27   CARDIAC CATHETERIZATION  Result Date: 08/15/2020  Mid RCA lesion is 90% stenosed.  Prox Cx lesion is 75% stenosed.  2nd Mrg lesion is 75% stenosed.  1st Mrg lesion is 100% stenosed. Faint right to left and left to left collaterals.  RPDA lesion is 70% stenosed.  2nd Diag-1 lesion is 75% stenosed.  2nd Diag-2 lesion is 75% stenosed.  Dist LAD lesion is 50% stenosed.  A drug-eluting stent was successfully placed using a STENT RESOLUTE ONYX 4.0X38, optimized with IVUS.  Post intervention, there is a 0% residual stenosis.  LV end diastolic pressure is normal.  There is no aortic valve stenosis.  75 cm long sheath needed for support due to tortuosity in the right subclavian to engage the RCA. Left coronary could be engaged easily with short sheath.  Diffuse multivessel disease, but no LAD involvement.  RCA successfully treated.  Small vessel disease noted in the RPDA, OM1 and OM2, second diagonal and apical LAD.  Proximal circumflex would be a potential target for PCI if  he had more symptoms. Intensify medical therapy.    CT CORONARY MORPH W/CTA COR W/SCORE W/CA W/CM &/OR WO/CM  Addendum Date: 08/14/2020   ADDENDUM REPORT: 08/14/2020 14:20 CLINICAL DATA:  63 year old male with h/o DM, hypertension, hyperlipidemia, former smoker who presented with chest pain. EXAM: Cardiac/Coronary  CTA TECHNIQUE: The patient was scanned on a [employer]. FINDINGS: A 100 kV prospective scan was triggered in the descending thoracic aorta at 111 HU's. Axial non-contrast 3 mm slices were carried out through  the heart. The data set was analyzed on a dedicated work station and scored using the Agatson method. Gantry rotation speed was 250 msecs and collimation was .6 mm. 100 mg of PO Metoprolol and 0.8 mg of sl NTG were given. The 3D data set was reconstructed in 5% intervals of the 67-82 % of the R-R cycle. Diastolic phases were analyzed on a dedicated work station using MPR, MIP and VRT modes. The patient received 80 cc of contrast. Aorta: Normal size. Mild diffuse atherosclerotic plaque and calcifications. No dissection. Aortic Valve:  Trileaflet.  No calcifications. Coronary Arteries:  Normal coronary origin.  Right dominance. RCA is a large dominant artery that gives rise to PDA and PLA. There is mild calcified plaque in the proximal RCA with stenosis 25-49%. Mid RCA has severe diffuse predominantly calcified plaque with a focal stenosis suspicious for > 70%. Distal RCA has moderate calcified plaque prior to bifurcation into PLA and PDA with stenosis 50-69%. PLA is small with diffuse moderate plaque. PDA is a medium caliber artery with diffuse moderate plaque in a bead like pattern. Left main is a large artery that gives rise to LAD and LCX arteries. Left main is a long artery with minimal calcified plaque and stenosis 0-25%. LAD is a large vessel that gives rise to two diagonal arteries. There is mild diffuse calcified plaque with focal stenoses in the proximal and mid LAD of 25-49%.  D1 is very small. D2 is a medium caliber artery (2.1 mm) that has severe mixed ostial plaque with lipid rich core and is suspicious for stenosis > 70%. LCX is a large caliber non-dominant artery that gives rise to two OM branches. Proximal LCX artery has severe non-calcified plaque suspicious for stenosis > 70%, this is followed by a moderate diffuse calcified plaque with stenosis 50-69%. Mid and distal LCX artery has small lumen and mild diffuse plaque. OM1 is very small and has severe diffuse plaque. OM2 is a medium caliber artery with diffuse moderate plaque in a bead like pattern. Other findings: Normal pulmonary vein drainage into the left atrium. Normal left atrial appendage without a thrombus. Normal size of the pulmonary artery. IMPRESSION: 1. Coronary calcium score of 1201. This was 95 percentile for age and sex matched control. 2. Normal coronary origin with right dominance. 3. CAD-RADS 4 Severe stenosis. (70-99%). Diffuse CAD in a bead-like diabetic pattern, there is suspicion for severe stenoses in the mid RCA, ostial portion of a large D2 and proximal portion of LCX artery. Additional analysis with CT FFR will be submitted. We will tentatively plan for a left cardiac catheterization for tomorrow. Electronically Signed   By: Tobias Alexander   On: 08/14/2020 14:20   Result Date: 08/14/2020 EXAM: OVER-READ INTERPRETATION  CT CHEST The following report is an over-read performed by radiologist Dr. Genevive Bi of [facility] on 08/14/2020. This over-read does not include interpretation of cardiac or coronary anatomy or pathology. The coronary CTA interpretation by the cardiologist is attached. COMPARISON:  CT a 08/11/2020 FINDINGS: Limited view of the lung parenchyma demonstrates mild LEFT basilar atelectasis pleural thickening with calcifications. Airways are normal. Limited view of the mediastinum demonstrates no adenopathy. Esophagus normal. Limited view of the upper abdomen  unremarkable. Limited view of the skeleton and chest wall is unremarkable. IMPRESSION: Pleural thickening and calcifications at the LEFT lung base. No change from prior Electronically Signed: By: Genevive Bi M.D. On: 08/14/2020 12:05   CT CORONARY FRACTIONAL FLOW RESERVE FLUID ANALYSIS  Result Date:  08/14/2020 EXAM: CT FFR ANALYSIS CLINICAL DATA:  64 year old male with diabetes, chest pain and abnormal coronary CTA. FINDINGS: FFRct analysis was performed on the original cardiac CT angiogram dataset. Diagrammatic representation of the FFRct analysis is provided in a separate PDF document in PACS. This dictation was created using the PDF document and an interactive 3D model of the results. 3D model is not available in the EMR/PACS. Normal FFR range is >0.80. 1. Left Main: 0.98. 2. LAD: Proximal: 0.97, distal: 0.87. 3. D1: Proximal: 0.75. 4. LCX: Proximal; 0.94, mid: Occluded. 5. RCA: Proximal: 0.98, distal: 0.77. IMPRESSION: 1. CT FFR analysis showed significant stenoses in proximal RCA, ostial portion of a large D1 and occluded proximal portion of LCX artery. Left cardiac catheterization is recommended. Electronically Signed   By: Tobias Alexander   On: 08/14/2020 17:41   ECHOCARDIOGRAM COMPLETE  Result Date: 08/13/2020    ECHOCARDIOGRAM REPORT   Patient Name:   Brandon [name] Date of Exam: 08/13/2020 Medical Rec #:  [id number]      Height:       69.0 in Accession #:    [id number]     Weight:       212.2 lb Date of Birth:  1956/03/25      BSA:          2.119 m Patient Age:    64 years       BP:           154/79 mmHg Patient Gender: M              HR:           92 bpm. Exam Location:  Inpatient Procedure: 2D Echo Indications:    chest pain  History:        Patient has no prior history of Echocardiogram examinations.                 Signs/Symptoms:Chest Pain; Risk Factors:Hypertension,                 Dyslipidemia and Diabetes.  Sonographer:    Delcie Roch Referring Phys: [id number] PING T ZHANG  IMPRESSIONS  1. Left ventricular ejection fraction, by estimation, is 60 to 65%. The left ventricle has normal function. The left ventricle has no regional wall motion abnormalities. There is mild left ventricular hypertrophy. Left ventricular diastolic parameters are consistent with Grade I diastolic dysfunction (impaired relaxation).  2. Right ventricular systolic function is normal. The right ventricular size is normal.  3. The mitral valve is normal in structure. No evidence of mitral valve regurgitation. No evidence of mitral stenosis.  4. The aortic valve is tricuspid. Aortic valve regurgitation is not visualized. No aortic stenosis is present.  5. The inferior vena cava is normal in size with greater than 50% respiratory variability, suggesting right atrial pressure of 3 mmHg. FINDINGS  Left Ventricle: Left ventricular ejection fraction, by estimation, is 60 to 65%. The left ventricle has normal function. The left ventricle has no regional wall motion abnormalities. The left ventricular internal cavity size was normal in size. There is  mild left ventricular hypertrophy. Left ventricular diastolic parameters are consistent with Grade I diastolic dysfunction (impaired relaxation). Normal left ventricular filling pressure. Right Ventricle: The right ventricular size is normal. No increase in right ventricular wall thickness. Right ventricular systolic function is normal. Left Atrium: Left atrial size was normal in size. Right Atrium: Right atrial size was normal in size. Pericardium: There is no evidence of pericardial effusion. Mitral  Valve: The mitral valve is normal in structure. No evidence of mitral valve regurgitation. No evidence of mitral valve stenosis. Tricuspid Valve: The tricuspid valve is normal in structure. Tricuspid valve regurgitation is not demonstrated. No evidence of tricuspid stenosis. Aortic Valve: The aortic valve is tricuspid. Aortic valve regurgitation is not visualized. No aortic  stenosis is present. Pulmonic Valve: The pulmonic valve was not well visualized. Pulmonic valve regurgitation is not visualized. No evidence of pulmonic stenosis. Aorta: The aortic root is normal in size and structure. Venous: The inferior vena cava is normal in size with greater than 50% respiratory variability, suggesting right atrial pressure of 3 mmHg. IAS/Shunts: No atrial level shunt detected by color flow Doppler.  LEFT VENTRICLE PLAX 2D LVIDd:         4.70 cm  Diastology LVIDs:         3.40 cm  LV e' medial:    6.20 cm/s LV PW:         1.10 cm  LV E/e' medial:  11.1 LV IVS:        0.90 cm  LV e' lateral:   9.46 cm/s LVOT diam:     2.20 cm  LV E/e' lateral: 7.3 LV SV:         65 LV SV Index:   31 LVOT Area:     3.80 cm  RIGHT VENTRICLE             IVC RV S prime:     12.80 cm/s  IVC diam: 1.60 cm TAPSE (M-mode): 1.7 cm LEFT ATRIUM             Index       RIGHT ATRIUM           Index LA diam:        4.00 cm 1.89 cm/m  RA Area:     12.50 cm LA Vol (A2C):   34.6 ml 16.33 ml/m RA Volume:   26.20 ml  12.37 ml/m LA Vol (A4C):   34.3 ml 16.19 ml/m LA Biplane Vol: 35.3 ml 16.66 ml/m  AORTIC VALVE LVOT Vmax:   107.41 cm/s LVOT Vmean:  69.396 cm/s LVOT VTI:    0.171 m  AORTA Ao Root diam: 3.20 cm Ao Asc diam:  3.20 cm MITRAL VALVE MV Area (PHT): 4.71 cm     SHUNTS MV Decel Time: 161 msec     Systemic VTI:  0.17 m MV E velocity: 69.00 cm/s   Systemic Diam: 2.20 cm MV A velocity: 105.00 cm/s MV E/A ratio:  0.66 Dina Rich MD Electronically signed by Dina Rich MD Signature Date/Time: 08/13/2020/11:35:37 AM    Final    CT Angio Chest/Abd/Pel for Dissection W and/or Wo Contrast  Result Date: 08/11/2020 CLINICAL DATA:  Abdominal pain, mid sternal chest pain with exertion for 3 days. Known today pain is constant. Hypertensive. Of aortic dissection suspected. EXAM: CT ANGIOGRAPHY CHEST, ABDOMEN AND PELVIS TECHNIQUE: Non-contrast CT of the chest was initially obtained. Multidetector CT imaging through  the chest, abdomen and pelvis was performed using the standard protocol during bolus administration of intravenous contrast. Multiplanar reconstructed images and MIPs were obtained and reviewed to evaluate the vascular anatomy. CONTRAST:  OMNIPAQUE IOHEXOL 350 MG/ML SOLN COMPARISON:  CT abdomen pelvis 04/14/2020. FINDINGS: VASCULAR Aorta: Mild atherosclerotic plaque. Normal caliber aorta without aneurysm, dissection, vasculitis or significant stenosis. Celiac: Patent without evidence of aneurysm, dissection, vasculitis or significant stenosis. SMA: Patent without evidence of aneurysm, dissection, vasculitis or significant stenosis.  Renals: Both renal arteries are patent without evidence of aneurysm, dissection, vasculitis, fibromuscular dysplasia or significant stenosis. IMA: Patent without evidence of aneurysm, dissection, vasculitis or significant stenosis. Inflow: Mild atherosclerotic plaque. Patent without evidence of aneurysm, dissection, vasculitis or significant stenosis. Veins: No obvious venous abnormality within the limitations of this arterial phase study. Review of the MIP images confirms the above findings. NON-VASCULAR Cardiovascular: Preferential opacification of the thoracic aorta. Normal heart size. No pericardial effusion. Mild left anterior descending coronary artery calcifications. Mediastinum/Nodes: Pre-vascular lymph node calcification. No enlarged mediastinal, hilar, or axillary lymph nodes. Thyroid gland, trachea, and esophagus demonstrate no significant findings. The main pulmonary artery is normal in caliber. No central pulmonary embolus. Lungs/Pleura: Left posterior pleura calcification. Calcified granuloma at the left base. No pulmonary mass. No focal consolidation. Left lower lobe scarring. No pleural effusion. No pneumothorax. Musculoskeletal: No chest wall abnormality. No suspicious lytic or blastic osseous lesions. No acute displaced fracture. Multilevel degenerative changes of  the spine. Lower back neurostimulator with electrodes terminating in the posterior spinal canal. Hepatobiliary: No focal liver abnormality is seen. Status post cholecystectomy. No biliary dilatation. Pancreas: For No focal lesion. Normal pancreatic contour. No surrounding inflammatory changes. No main pancreatic ductal dilatation. Spleen: Couple of punctate calcifications within the splenic parenchyma likely represent sequelae of prior granulomatous disease. normal in size without focal abnormality. Adrenals/Urinary Tract: No adrenal nodule bilaterally. Bilateral kidneys enhance symmetrically. Similar-appearing 2.7 cm fluid density lesion within the right kidney that likely represents a simple renal cyst. No hydronephrosis. No hydroureter. The urinary bladder is distended with urine and grossly unremarkable. Stomach/Bowel: Stomach is within normal limits. No evidence of bowel wall thickening or dilatation. Scattered colonic diverticulosis appendix appears normal. Lymphatic: No abdominal or pelvic lymphadenopathy. Reproductive: Penile prosthesis is noted with a reservoir within the left anterior lower abdomen. The prostate is enlarged measuring up to 5.6 cm. Other: No intraperitoneal free fluid. No intraperitoneal free gas. No organized fluid collection. Musculoskeletal: No abdominal wall hernia or abnormality. L4-L5 laminectomy and posterolateral fusion. No suspicious lytic or blastic osseous lesions. No acute displaced fracture. Multilevel degenerative changes of the spine. Review of the MIP images confirms the above findings. IMPRESSION: 1. No aortic aneurysm or dissection. Aortic Atherosclerosis (ICD10-I70.0) - mild. 2. No acute intrathoracic, intra-abdominal, or intrapelvic abnormality in a patient with prior granulomatous disease. 3. Prostatomegaly. 4. Scattered colonic diverticulosis with no acute diverticulitis. Electronically Signed   By: Tish FredericksonMorgane  Naveau M.D.   On: 08/11/2020 21:40      Discharge  Exam: Vitals:   08/15/20 1900 08/16/20 0500  BP: (!) 115/59 135/69  Pulse: 73 70  Resp: 18 18  Temp: 97.7 F (36.5 C) 97.8 F (36.6 C)  SpO2: 93% 94%   Vitals:   08/15/20 1709 08/15/20 1823 08/15/20 1900 08/16/20 0500  BP: 116/67 119/64 (!) 115/59 135/69  Pulse: 72 75 73 70  Resp: 16 16 18 18   Temp:   97.7 F (36.5 C) 97.8 F (36.6 C)  TempSrc:   Oral Oral  SpO2: 94% 95% 93% 94%  Weight:    97 kg  Height:        General: Pt is alert, awake, not in acute distress Cardiovascular: RRR, S1/S2 +, no rubs, no gallops Respiratory: CTA bilaterally, no wheezing, no rhonchi Abdominal: Soft, NT, ND, bowel sounds + Extremities: no edema, no cyanosis    The results of significant diagnostics from this hospitalization (including imaging, microbiology, ancillary and laboratory) are listed below for reference.     Microbiology:  Recent Results (from the past 240 hour(s))  Resp Panel by RT-PCR (Flu A&B, Covid) Nasopharyngeal Swab     Status: None   Collection Time: 08/11/20 10:40 PM   Specimen: Nasopharyngeal Swab; Nasopharyngeal(NP) swabs in vial transport medium  Result Value Ref Range Status   SARS Coronavirus 2 by RT PCR NEGATIVE NEGATIVE Final    Comment: (NOTE) SARS-CoV-2 target nucleic acids are NOT DETECTED.  The SARS-CoV-2 RNA is generally detectable in upper respiratory specimens during the acute phase of infection. The lowest concentration of SARS-CoV-2 viral copies this assay can detect is 138 copies/mL. A negative result does not preclude SARS-Cov-2 infection and should not be used as the sole basis for treatment or other patient management decisions. A negative result may occur with  improper specimen collection/handling, submission of specimen other than nasopharyngeal swab, presence of viral mutation(s) within the areas targeted by this assay, and inadequate number of viral copies(<138 copies/mL). A negative result must be combined with clinical observations,  patient history, and epidemiological information. The expected result is Negative.  Fact Sheet for Patients:  BloggerCourse.com  Fact Sheet for Healthcare Providers:  SeriousBroker.it  This test is no t yet approved or cleared by the [place] FDA and  has been authorized for detection and/or diagnosis of SARS-CoV-2 by FDA under an Emergency Use Authorization (EUA). This EUA will remain  in effect (meaning this test can be used) for the duration of the COVID-19 declaration under Section 564(b)(1) of the Act, 21 U.S.C.section 360bbb-3(b)(1), unless the authorization is terminated  or revoked sooner.       Influenza A by PCR NEGATIVE NEGATIVE Final   Influenza B by PCR NEGATIVE NEGATIVE Final    Comment: (NOTE) The Xpert Xpress SARS-CoV-2/FLU/RSV plus assay is intended as an aid in the diagnosis of influenza from Nasopharyngeal swab specimens and should not be used as a sole basis for treatment. Nasal washings and aspirates are unacceptable for Xpert Xpress SARS-CoV-2/FLU/RSV testing.  Fact Sheet for Patients: BloggerCourse.com  Fact Sheet for Healthcare Providers: SeriousBroker.it  This test is not yet approved or cleared by the [place] FDA and has been authorized for detection and/or diagnosis of SARS-CoV-2 by FDA under an Emergency Use Authorization (EUA). This EUA will remain in effect (meaning this test can be used) for the duration of the COVID-19 declaration under Section 564(b)(1) of the Act, 21 U.S.C. section 360bbb-3(b)(1), unless the authorization is terminated or revoked.  Performed at [facility], [address] [place], [place] [address]      Labs: BNP (last 3 results) No results for input(s): BNP in the last 8760 hours. Basic Metabolic Panel: Recent Labs  Lab 08/11/20 1415 08/15/20 0515 08/16/20 0235  NA 136 139 136  K 3.2* 3.7  3.7  CL 101 101 104  CO2 22 27 23   GLUCOSE 217* 78 138*  BUN 7* 14 9  CREATININE 0.86 0.88 0.72  CALCIUM 8.9 9.0 8.5*   Liver Function Tests: Recent Labs  Lab 08/12/20 0033  AST 24  ALT 14  ALKPHOS 65  BILITOT 0.9  PROT 7.0  ALBUMIN 3.8   Recent Labs  Lab 08/12/20 0033  LIPASE 38   No results for input(s): AMMONIA in the last 168 hours. CBC: Recent Labs  Lab 08/11/20 1415 08/15/20 0515 08/16/20 0235  WBC 6.2 7.8 5.9  HGB 15.8 15.6 14.3  HCT 47.6 49.6 43.8  MCV 86.2 88.4 88.7  PLT 232 262 246   Cardiac Enzymes: No results  for input(s): CKTOTAL, CKMB, CKMBINDEX, TROPONINI in the last 168 hours. BNP: Invalid input(s): POCBNP CBG: Recent Labs  Lab 08/15/20 1458 08/15/20 1708 08/15/20 2122 08/16/20 0749 08/16/20 1108  GLUCAP 91 79 207* 151* 135*   D-Dimer No results for input(s): DDIMER in the last 72 hours. Hgb A1c No results for input(s): HGBA1C in the last 72 hours. Lipid Profile No results for input(s): CHOL, HDL, LDLCALC, TRIG, CHOLHDL, LDLDIRECT in the last 72 hours. Thyroid function studies No results for input(s): TSH, T4TOTAL, T3FREE, THYROIDAB in the last 72 hours.  Invalid input(s): FREET3 Anemia work up No results for input(s): VITAMINB12, FOLATE, FERRITIN, TIBC, IRON, RETICCTPCT in the last 72 hours. Urinalysis    Component Value Date/Time   COLORURINE YELLOW 04/14/2020 1908   APPEARANCEUR CLEAR 04/14/2020 1908   LABSPEC 1.014 04/14/2020 1908   PHURINE 5.0 04/14/2020 1908   GLUCOSEU 50 (A) 04/14/2020 1908   HGBUR NEGATIVE 04/14/2020 1908   BILIRUBINUR negative 06/22/2020 1150   KETONESUR negative 06/22/2020 1150   KETONESUR NEGATIVE 04/14/2020 1908   PROTEINUR 30 (A) 04/14/2020 1908   UROBILINOGEN 0.2 06/22/2020 1150   NITRITE Negative 06/22/2020 1150   NITRITE NEGATIVE 04/14/2020 1908   LEUKOCYTESUR Negative 06/22/2020 1150   LEUKOCYTESUR NEGATIVE 04/14/2020 1908   Sepsis Labs Invalid input(s): PROCALCITONIN,  WBC,   LACTICIDVEN Microbiology Recent Results (from the past 240 hour(s))  Resp Panel by RT-PCR (Flu A&B, Covid) Nasopharyngeal Swab     Status: None   Collection Time: 08/11/20 10:40 PM   Specimen: Nasopharyngeal Swab; Nasopharyngeal(NP) swabs in vial transport medium  Result Value Ref Range Status   SARS Coronavirus 2 by RT PCR NEGATIVE NEGATIVE Final    Comment: (NOTE) SARS-CoV-2 target nucleic acids are NOT DETECTED.  The SARS-CoV-2 RNA is generally detectable in upper respiratory specimens during the acute phase of infection. The lowest concentration of SARS-CoV-2 viral copies this assay can detect is 138 copies/mL. A negative result does not preclude SARS-Cov-2 infection and should not be used as the sole basis for treatment or other patient management decisions. A negative result may occur with  improper specimen collection/handling, submission of specimen other than nasopharyngeal swab, presence of viral mutation(s) within the areas targeted by this assay, and inadequate number of viral copies(<138 copies/mL). A negative result must be combined with clinical observations, patient history, and epidemiological information. The expected result is Negative.  Fact Sheet for Patients:  BloggerCourse.com  Fact Sheet for Healthcare Providers:  SeriousBroker.it  This test is no t yet approved or cleared by the [place] FDA and  has been authorized for detection and/or diagnosis of SARS-CoV-2 by FDA under an Emergency Use Authorization (EUA). This EUA will remain  in effect (meaning this test can be used) for the duration of the COVID-19 declaration under Section 564(b)(1) of the Act, 21 U.S.C.section 360bbb-3(b)(1), unless the authorization is terminated  or revoked sooner.       Influenza A by PCR NEGATIVE NEGATIVE Final   Influenza B by PCR NEGATIVE NEGATIVE Final    Comment: (NOTE) The Xpert Xpress SARS-CoV-2/FLU/RSV plus  assay is intended as an aid in the diagnosis of influenza from Nasopharyngeal swab specimens and should not be used as a sole basis for treatment. Nasal washings and aspirates are unacceptable for Xpert Xpress SARS-CoV-2/FLU/RSV testing.  Fact Sheet for Patients: BloggerCourse.com  Fact Sheet for Healthcare Providers: SeriousBroker.it  This test is not yet approved or cleared by the [place] FDA and has been authorized for detection and/or diagnosis  of SARS-CoV-2 by FDA under an Emergency Use Authorization (EUA). This EUA will remain in effect (meaning this test can be used) for the duration of the COVID-19 declaration under Section 564(b)(1) of the Act, 21 U.S.C. section 360bbb-3(b)(1), unless the authorization is terminated or revoked.  Performed at [facility], [address] [place], [place] [address]      Time coordinating discharge: Over 30 minutes  SIGNED:   Hughie Closs, MD  [facility] 08/16/2020, 12:22 PM  If 7PM-7AM, please contact night-coverage [email or web address]

## 2020-08-17 ENCOUNTER — Telehealth (HOSPITAL_COMMUNITY): Payer: Self-pay

## 2020-08-17 ENCOUNTER — Other Ambulatory Visit: Payer: Self-pay | Admitting: *Deleted

## 2020-08-17 ENCOUNTER — Encounter: Payer: Self-pay | Admitting: *Deleted

## 2020-08-17 NOTE — Telephone Encounter (Signed)
Called patient to see if he is interested in the Cardiac Rehab Program. Patient expressed interest. Explained scheduling process and went over insurance, patient verbalized understanding. Will contact patient for scheduling once f/u has been completed.  °

## 2020-08-17 NOTE — Patient Outreach (Signed)
[facility]) Care Management  08/17/2020  Brandon [name] 1956-01-30 [id number]   Transition of care call/case closure   Referral received:08/14/20 Initial outreach:08/17/20 Insurance: [employer]    Subjective: Initial successful telephone call to patient's preferred number in order to complete transition of care assessment; 2 HIPAA identifiers verified. Explained purpose of call and completed transition of care assessment.  [name] states that he is is doing okay, states no chest pain. He reports just taking it easy at home. Discussed sl NTG , patient able to states how to use. He reports wrist site from cardiac cath looking good he changed bandaid at site today. He reports  tolerating diet, denies bowel or bladder problems.  Spouse is   assisting with his  recovery.  Patient reports monitoring blood sugar at home , reading have been 105-125. He reports having a blood pressure monitor but it is still packed up as they recently moved from [place]. Discussed with patient benefit of [employer] employee insurance plan with  pharmacy for low cost blood pressure monitor.  Reviewed accessing the following [place] [employer] : He discussed ongoing health issues of Diabetes, hypertension, he is  a referral to one of the [place] chronic disease management programs.  Patient has spoken with wife and he does not have the hospital indemnity He uses a [facility] outpatient pharmacy at [facility] outpatient pharmacy.   He denies educational needs related to staying safe during the COVID 19 pandemic.    Objective:  [name]  was hospitalized at [facility] 12/19-12/22/21 for Chest pain, CAD, PCI stent of RCA   Comorbidities include: Diabetes type 2, A1c 10.1, hypertension, hyperlipidemia, failed back syndrome of lumbar spine.  He was discharged to home on 12/22/21without the need for home health services or DME.   Assessment:  Patient voices good  understanding of all discharge instructions.  See transition of care flowsheet for assessment details.   Plan:  Reviewed hospital discharge diagnosis of Chest pain, CAD, PCI stent  and discharge treatment plan using hospital discharge instructions, assessing medication adherence, reviewing problems requiring provider notification, and discussing the importance of follow up with surgeon, primary care provider and/or specialists as directed.  Reviewed [place] healthy lifestyle program information to receive discounted premium for  2022   Step 1: Get  your annual physical  Step 2: Complete your health assessment  Step 3:Identify your current health status and complete the corresponding action step between January 1, and April 26, 2020.    Using Active Health Management ActiveAdvice View website, with patient agreement enrolled to  participate in [facility] [place]'s Active Health Management chronic disease management program.    No ongoing care management needs identified so will close case to [facility] Care Management services and route successful outreach letter with [facility] Care Management pamphlet and 24 Hour Nurse Line Magnet to [employer] Care Management clinical pool to be mailed to patient's home address.   Egbert Garibaldi, RN, BSN  [facility] Care Management,Care Management Coordinator  [phone]- Mobile [phone]- Toll Free Main Office

## 2020-08-17 NOTE — Telephone Encounter (Signed)
Patient contacted regarding discharge from [facility] on 08/16/20.  Patient understands to follow up with provider Azalee Course, PA-C on 08/30/2020 at 11:15 at [facility]. Patient understands discharge instructions? Yes Patient understands medications and regiment? Yes - Pt started Plavix today Patient understands to bring all medications to this visit? Yes  Pt currently enrolled in MyChart.  Pt states he is not having any symptoms and is doing well.

## 2020-08-19 ENCOUNTER — Encounter: Payer: Self-pay | Admitting: Family Medicine

## 2020-08-21 ENCOUNTER — Telehealth: Payer: Self-pay | Admitting: Physician Assistant

## 2020-08-21 NOTE — Telephone Encounter (Signed)
Called pt back. Pt states that since he has been discharged home from the hospital on 08/16/20 following heart cath and intervention on 08/15/20, pt has been having trouble sleeping.  Pt states that he can sleep for a few hours each night but then wakes up and has trouble going back to sleep.  Pt states that he received something in the hospital to help him sleep and that worked very well. Per hospital chart pt was given 5mg  Ambien once.  Pt states that he is taking his meds as prescribed.  Advised pt he could consider using over the counter meds to help with sleep and that we could seek advice from [place] as well. Pt verbalizes understanding.

## 2020-08-21 NOTE — Telephone Encounter (Signed)
Medical Record's received FMLA Paperwork for Brandon [name] 08/17/20 jc L/M with patient that we received paperwork and will need for Mr. Petta to contact us about payment and release form.08/21/20 jc

## 2020-08-21 NOTE — Telephone Encounter (Signed)
Patient is calling stating that since he was discharged from the hospital on 08/16/2020 he's having trouble sleeping. Please advise.

## 2020-08-21 NOTE — Telephone Encounter (Signed)
Pt called in and wanted to know if the office has rec'd the paperwork from [employer] for wife to be at home to help pt post Heart Cath.    Best number [phone]

## 2020-08-22 ENCOUNTER — Other Ambulatory Visit (HOSPITAL_COMMUNITY): Payer: Self-pay

## 2020-08-22 ENCOUNTER — Telehealth: Payer: Self-pay | Admitting: Family Medicine

## 2020-08-22 ENCOUNTER — Telehealth: Payer: Self-pay

## 2020-08-22 DIAGNOSIS — E1169 Type 2 diabetes mellitus with other specified complication: Secondary | ICD-10-CM

## 2020-08-22 MED FILL — OMRON 3 SERIES BP MONITOR D: 1 days supply | Qty: 1 | Fill #0

## 2020-08-22 NOTE — Telephone Encounter (Addendum)
Pt called requesting a Endocrinologist referral. Pt prefer location is [place]. Pt did mention that request is related with his diagnosis of DM. Referral pended.   Pls send responding messages to [place].

## 2020-08-22 NOTE — Telephone Encounter (Signed)
Brandon [name] called and stated he recently had an angioplasty done and while at the hospital was told he really needed to be seen by an endocrinologist to help keep his blood sugars under control. He would like a referral to Dr. Everardo All at [facility] for Diabetes management he also has [employer] which requires him to have the referral   Thank you Arline Asp

## 2020-08-22 NOTE — Telephone Encounter (Signed)
Bertram Gala to place referral as below.

## 2020-08-23 ENCOUNTER — Other Ambulatory Visit: Payer: Self-pay | Admitting: Family Medicine

## 2020-08-23 ENCOUNTER — Ambulatory Visit: Payer: No Typology Code available for payment source | Admitting: Sports Medicine

## 2020-08-28 MED FILL — tiZANidine HCL 4 MG TABS: 4 | 30 days supply | Qty: 90 | Fill #0

## 2020-08-30 ENCOUNTER — Encounter: Payer: Self-pay | Admitting: Physician Assistant

## 2020-08-30 ENCOUNTER — Other Ambulatory Visit: Payer: Self-pay

## 2020-08-30 ENCOUNTER — Other Ambulatory Visit: Payer: Self-pay | Admitting: Physician Assistant

## 2020-08-30 ENCOUNTER — Ambulatory Visit (INDEPENDENT_AMBULATORY_CARE_PROVIDER_SITE_OTHER): Payer: No Typology Code available for payment source | Admitting: Physician Assistant

## 2020-08-30 VITALS — BP 128/72 | Ht 69.0 in | Wt 219.6 lb

## 2020-08-30 DIAGNOSIS — E119 Type 2 diabetes mellitus without complications: Secondary | ICD-10-CM | POA: Diagnosis not present

## 2020-08-30 DIAGNOSIS — I2510 Atherosclerotic heart disease of native coronary artery without angina pectoris: Secondary | ICD-10-CM | POA: Diagnosis not present

## 2020-08-30 DIAGNOSIS — Z794 Long term (current) use of insulin: Secondary | ICD-10-CM

## 2020-08-30 DIAGNOSIS — E785 Hyperlipidemia, unspecified: Secondary | ICD-10-CM | POA: Diagnosis not present

## 2020-08-30 DIAGNOSIS — I10 Essential (primary) hypertension: Secondary | ICD-10-CM | POA: Diagnosis not present

## 2020-08-30 MED ORDER — METOPROLOL TARTRATE 25 MG PO TABS: 25.0000 mg | ORAL_TABLET | Freq: Two times a day (BID) | ORAL | 3 refills | Status: DC

## 2020-08-30 MED ORDER — ATORVASTATIN CALCIUM 80 MG PO TABS: 80.0000 mg | ORAL_TABLET | Freq: Every day | ORAL | 3 refills | Status: DC

## 2020-08-30 MED ORDER — CLOPIDOGREL BISULFATE 75 MG PO TABS: 75.0000 mg | ORAL_TABLET | Freq: Every day | ORAL | 3 refills | Status: DC

## 2020-08-30 NOTE — Progress Notes (Signed)
Cardiology Office Note:    Date:  08/31/2020   ID:  AADIT HAGOOD, DOB [birth year], MRN [id number]  PCP:  Everrett Coombe, DO  [facility] Cardiologist:  Meriam Sprague, MD  [facility] Electrophysiologist:  None   Referring MD: Everrett Coombe, DO   Chief Complaint  Patient presents with  . Follow-up    Post Cath    History of Present Illness:    Brandon [name] is a 65 y.o. male with a hx of CAD, HTN, HLD, DM II with diabetic gastroparesis and anxiety.  Patient was recently admitted to the hospital on 08/11/2020 with chest pain.  CTA of the chest showed no evidence of aortic dissection or aneurysm.  Respiratory panel negative for influenza and Covid.  D-dimer negative.  Troponin negative x2.  Echocardiogram obtained on 08/24/2020 showed EF 60 to 65%, no significant valve issue.  Given the atypical nature of his symptoms, he underwent coronary CT on 08/14/2020 which showed coronary calcium score of 1201 which placed the patient in 95th percentile for age and sex matched control, diffuse CAD with beadlike diabetic pattern with suspicion for severe stenosis in the mid RCA, ostial D2 and proximal left circumflex artery.  He subsequently underwent cardiac catheterization on 08/15/2020 which showed 90% mid RCA lesion, 75% proximal left circumflex artery, 75% OM 2, 100% OM1 with faint left to left and right-to-left collaterals, 70% RPDA, 75% D2, 30% distal LAD lesion.  The 90% mid RCA lesion was treated successfully with a 4.0 x 38 mm resolute DES.  The small disease in RPDA, OM1, OM2 at the diagonal vessel was managed medically. Postprocedure, he continued to have chest discomfort.  A bedside echocardiogram obtained on the diet of 08/15/2020 did not reveal any pericardial effusion, there was no change in LV function.  He was treated with colchicine 0.6 mg twice a day, colchicine was subsequently discontinued due to lack of recurrent symptoms.  Hemoglobin A1c obtained during this admission was  10, he has been advised to follow-up with his primary care provider.  Patient presents today for follow-up.  He denies any chest pain or shortness of breath.  He has been compliant with aspirin and Plavix.  I recommended fasting lipid panel and LFT in 6 to 8 weeks.  Otherwise, he has no lower extremity edema, orthopnea or PND.  Past Medical History:  Diagnosis Date  . Anxiety   . Basal cell carcinoma (BCC) in situ of skin   . Colon polyps   . Depression   . Diabetes mellitus without complication (HCC)   . Hypertension   . SBO (small bowel obstruction) (HCC)     Past Surgical History:  Procedure Laterality Date  . BACK SURGERY    . CATARACT EXTRACTION W/ INTRAOCULAR LENS  IMPLANT, BILATERAL    . CHOLECYSTECTOMY    . CHOLECYSTECTOMY, LAPAROSCOPIC    . CORONARY STENT INTERVENTION N/A 08/15/2020   Procedure: CORONARY STENT INTERVENTION;  Surgeon: Corky Crafts, MD;  Location: [facility] INVASIVE CV LAB;  Service: Cardiovascular;  Laterality: N/A;  . EYE SURGERY    . INTRAVASCULAR ULTRASOUND/IVUS N/A 08/15/2020   Procedure: Intravascular Ultrasound/IVUS;  Surgeon: Corky Crafts, MD;  Location: [facility] INVASIVE CV LAB;  Service: Cardiovascular;  Laterality: N/A;  . LEFT HEART CATH AND CORONARY ANGIOGRAPHY N/A 08/15/2020   Procedure: LEFT HEART CATH AND CORONARY ANGIOGRAPHY;  Surgeon: Corky Crafts, MD;  Location: [facility] INVASIVE CV LAB;  Service: Cardiovascular;  Laterality: N/A;  . LUMBAR FUSION  L3-5 with rods  . XI ROBOTIC ASSISTED INGUINAL HERNIA REPAIR WITH MESH     x 3 surgeries    Current Medications: Current Meds  Medication Sig  . amLODipine (NORVASC) 10 MG tablet Take 10 mg by mouth every morning.  . ARIPiprazole (ABILIFY) 10 MG tablet Take 10 mg by mouth every morning.  Marland Kitchen aspirin 81 MG EC tablet Take 81 mg by mouth every morning.  . Blood Pressure Monitoring (OMRON 3 SERIES BP MONITOR) DEVI   . buPROPion (WELLBUTRIN XL) 150 MG 24 hr tablet Take 150 mg by mouth  every morning. Take with a 300 mg tablet for a total dose of 450 mg daily  . buPROPion (WELLBUTRIN XL) 300 MG 24 hr tablet Take 300 mg by mouth every morning. Take with a 150 mg tablet for a total dose of 450 mg daily  . empagliflozin (JARDIANCE) 25 MG TABS tablet Take 25 mg by mouth every morning.  . insulin degludec (TRESIBA FLEXTOUCH) 100 UNIT/ML FlexTouch Pen Inject 60 Units into the skin daily. (Patient taking differently: Inject 60 Units into the skin daily before breakfast.)  . insulin lispro (HUMALOG) 100 UNIT/ML KwikPen Inject 10-24 Units into the skin 3 (three) times daily before meals. Per sliding scale - 1 unit per 7 carbs  . metoCLOPramide (REGLAN) 10 MG tablet Take 10 mg by mouth daily as needed for nausea or vomiting (upset stomach). PRN  . nitroGLYCERIN (NITROSTAT) 0.4 MG SL tablet Place 1 tablet (0.4 mg total) under the tongue every 5 (five) minutes as needed for chest pain.  Marland Kitchen ondansetron (ZOFRAN ODT) 4 MG disintegrating tablet Take 1 tablet (4 mg total) by mouth every 8 (eight) hours as needed for nausea or vomiting.  . pantoprazole (PROTONIX) 40 MG tablet Take 40 mg by mouth every morning.  . pioglitazone (ACTOS) 30 MG tablet Take 30 mg by mouth every morning.  . tamsulosin (FLOMAX) 0.4 MG CAPS capsule Take 0.4 mg by mouth every morning.  . Testosterone Cypionate 200 MG/ML SOLN Inject 200 mg into the muscle every Sunday.  Marland Kitchen tiZANidine (ZANAFLEX) 4 MG tablet Take 1 tablet (4 mg total) by mouth every 8 (eight) hours as needed for muscle spasms. (Patient taking differently: Take 4 mg by mouth 3 (three) times daily.)  . traMADol (ULTRAM) 50 MG tablet Take 1 tablet (50 mg total) by mouth 3 (three) times daily as needed. (Patient taking differently: Take 50-100 mg by mouth every 8 (eight) hours.)  . triazolam (HALCION) 0.25 MG tablet 1-2 tabs PO 2 hours before procedure or imaging.  Do not drive with this medication. (Patient taking differently: Take 0.25-0.5 mg by mouth See admin  instructions. Take 1-2 tablets (0.25 -0.5 mg) by mouth 2 hours before procedure or imaging.  Do not drive with this medication.)  . [DISCONTINUED] atorvastatin (LIPITOR) 80 MG tablet Take 1 tablet (80 mg total) by mouth daily.  . [DISCONTINUED] clopidogrel (PLAVIX) 75 MG tablet Take 1 tablet (75 mg total) by mouth daily with breakfast.  . [DISCONTINUED] metoprolol tartrate (LOPRESSOR) 25 MG tablet Take 1 tablet (25 mg total) by mouth 2 (two) times daily.     Allergies:   Kiwi extract, Other, Penicillins, Ancef [cefazolin], Latex, and Levaquin [levofloxacin]   Social History   Socioeconomic History  . Marital status: Married    Spouse name: Not on file  . Number of children: Not on file  . Years of education: Not on file  . Highest education level: Not on file  Occupational History  .  Not on file  Tobacco Use  . Smoking status: Former Games developer  . Smokeless tobacco: Never Used  Substance and Sexual Activity  . Alcohol use: Not Currently  . Drug use: Not Currently  . Sexual activity: Yes  Other Topics Concern  . Not on file  Social History Narrative  . Not on file   Social Determinants of Health   Financial Resource Strain: Not on file  Food Insecurity: Not on file  Transportation Needs: Not on file  Physical Activity: Not on file  Stress: Not on file  Social Connections: Not on file     Family History: The patient's family history includes Anxiety disorder in his father and mother; COPD in his mother; Cancer in his father; Depression in his father and mother; Other in his father.  ROS:   Please see the history of present illness.     All other systems reviewed and are negative.  EKGs/Labs/Other Studies Reviewed:    The following studies were reviewed today:  Echo 08/13/2020 1. Left ventricular ejection fraction, by estimation, is 60 to 65%. The  left ventricle has normal function. The left ventricle has no regional  wall motion abnormalities. There is mild left  ventricular hypertrophy.  Left ventricular diastolic parameters  are consistent with Grade I diastolic dysfunction (impaired relaxation).  2. Right ventricular systolic function is normal. The right ventricular  size is normal.  3. The mitral valve is normal in structure. No evidence of mitral valve  regurgitation. No evidence of mitral stenosis.  4. The aortic valve is tricuspid. Aortic valve regurgitation is not  visualized. No aortic stenosis is present.  5. The inferior vena cava is normal in size with greater than 50%  respiratory variability, suggesting right atrial pressure of 3 mmHg.    Cath 12/21/20201  Mid RCA lesion is 90% stenosed.  Prox Cx lesion is 75% stenosed.  2nd Mrg lesion is 75% stenosed.  1st Mrg lesion is 100% stenosed. Faint right to left and left to left collaterals.  RPDA lesion is 70% stenosed.  2nd Diag-1 lesion is 75% stenosed.  2nd Diag-2 lesion is 75% stenosed.  Dist LAD lesion is 50% stenosed.  A drug-eluting stent was successfully placed using a STENT RESOLUTE ONYX 4.0X38, optimized with IVUS.  Post intervention, there is a 0% residual stenosis.  LV end diastolic pressure is normal.  There is no aortic valve stenosis.  75 cm long sheath needed for support due to tortuosity in the right subclavian to engage the RCA. Left coronary could be engaged easily with short sheath.   Diffuse multivessel disease, but no LAD involvement.    RCA successfully treated.  Small vessel disease noted in the RPDA, OM1 and OM2, second diagonal and apical LAD.  Proximal circumflex would be a potential target for PCI if he had more symptoms.   Intensify medical therapy.      EKG:  EKG is ordered today.  The ekg ordered today demonstrates normal sinus rhythm without significant ST-T wave changes.  Recent Labs: 04/16/2020: Magnesium 1.9 08/12/2020: ALT 14 08/13/2020: TSH 2.345 08/16/2020: BUN 9; Creatinine, Ser 0.72; Hemoglobin 14.3; Platelets 246;  Potassium 3.7; Sodium 136  Recent Lipid Panel    Component Value Date/Time   CHOL 160 08/13/2020 0119   TRIG 258 (H) 08/13/2020 0119   HDL 32 (L) 08/13/2020 0119   CHOLHDL 5.0 08/13/2020 0119   VLDL 52 (H) 08/13/2020 0119   LDLCALC 76 08/13/2020 0119     Risk Assessment/Calculations:  Physical Exam:    VS:  BP 128/72   Ht 5\' 9"  (1.753 m)   Wt 219 lb 9.6 oz (99.6 kg)   BMI 32.43 kg/m     Wt Readings from Last 3 Encounters:  08/30/20 219 lb 9.6 oz (99.6 kg)  08/16/20 213 lb 14.4 oz (97 kg)  06/22/20 227 lb (103 kg)     GEN:  Well nourished, well developed in no acute distress HEENT: Normal NECK: No JVD; No carotid bruits LYMPHATICS: No lymphadenopathy CARDIAC: RRR, no murmurs, rubs, gallops RESPIRATORY:  Clear to auscultation without rales, wheezing or rhonchi  ABDOMEN: Soft, non-tender, non-distended MUSCULOSKELETAL:  No edema; No deformity  SKIN: Warm and dry NEUROLOGIC:  Alert and oriented x 3 PSYCHIATRIC:  Normal affect   ASSESSMENT:    1. Coronary artery disease involving native coronary artery of native heart without angina pectoris   2. Hyperlipidemia, unspecified hyperlipidemia type   3. Primary hypertension   4. Controlled type 2 diabetes mellitus without complication, with long-term current use of insulin (HCC)    PLAN:    In order of problems listed above:  1. CAD: Recently underwent DES to RCA.  Continue aspirin and Plavix.  2. Hyperlipidemia: Continue Lipitor.  Obtain fasting lipid panel and LFT in 6 to 8 weeks  3. Hypertension: Blood pressure stable  4. DM2: Managed by primary care provider.    Medication Adjustments/Labs and Tests Ordered: Current medicines are reviewed at length with the patient today.  Concerns regarding medicines are outlined above.  Orders Placed This Encounter  Procedures  . Hepatic function panel  . Lipid panel  . EKG 12-Lead   Meds ordered this encounter  Medications  . atorvastatin (LIPITOR) 80 MG  tablet    Sig: Take 1 tablet (80 mg total) by mouth daily.    Dispense:  90 tablet    Refill:  3  . clopidogrel (PLAVIX) 75 MG tablet    Sig: Take 1 tablet (75 mg total) by mouth daily with breakfast.    Dispense:  90 tablet    Refill:  3  . metoprolol tartrate (LOPRESSOR) 25 MG tablet    Sig: Take 1 tablet (25 mg total) by mouth 2 (two) times daily.    Dispense:  180 tablet    Refill:  3    Patient Instructions  Medication Instructions:  Your physician recommends that you continue on your current medications as directed. Please refer to the Current Medication list given to you today.  *If you need a refill on your cardiac medications before your next appointment, please call your pharmacy*  Lab Work: Your physician recommends that you return for lab work in 6-8 weeks Earliest is 10/11/20 latest 10/25/20:   Fasting Lipid Panel-DO NOT EAT OR DRINK PAST MIDNIGHT. OKAY TO HAVE WATER.  Hepatic (Liver) Function Test  If you have labs (blood work) drawn today and your tests are completely normal, you will receive your results only by: Marland Kitchen MyChart Message (if you have MyChart) OR . A paper copy in the mail If you have any lab test that is abnormal or we need to change your treatment, we will call you to review the results.  Testing/Procedures: NONE ordered at this time of appointment   Follow-Up: At [facility], you and your health needs are our priority.  As part of our continuing mission to provide you with exceptional heart care, we have created designated Provider Care Teams.  These Care Teams include your primary Cardiologist (physician) and  Advanced Practice Providers (APPs -  Physician Assistants and Nurse Practitioners) who all work together to provide you with the care you need, when you need it.   Your next appointment:   2-3 month(s)  The format for your next appointment:   In Person  Provider:   Laurance Flatten, MD  Other Instructions       Signed, Azalee Course, PA  08/31/2020 12:00 AM    [facility]

## 2020-08-30 NOTE — Patient Instructions (Addendum)
Medication Instructions:  Your physician recommends that you continue on your current medications as directed. Please refer to the Current Medication list given to you today.  *If you need a refill on your cardiac medications before your next appointment, please call your pharmacy*  Lab Work: Your physician recommends that you return for lab work in 6-8 weeks Earliest is 10/11/20 latest 10/25/20:   Fasting Lipid Panel-DO NOT EAT OR DRINK PAST MIDNIGHT. OKAY TO HAVE WATER.  Hepatic (Liver) Function Test  If you have labs (blood work) drawn today and your tests are completely normal, you will receive your results only by: Marland Kitchen MyChart Message (if you have MyChart) OR . A paper copy in the mail If you have any lab test that is abnormal or we need to change your treatment, we will call you to review the results.  Testing/Procedures: NONE ordered at this time of appointment   Follow-Up: At [facility], you and your health needs are our priority.  As part of our continuing mission to provide you with exceptional heart care, we have created designated Provider Care Teams.  These Care Teams include your primary Cardiologist (physician) and Advanced Practice Providers (APPs -  Physician Assistants and Nurse Practitioners) who all work together to provide you with the care you need, when you need it.   Your next appointment:   2-3 month(s)  The format for your next appointment:   In Person  Provider:   Laurance Flatten, MD  Other Instructions

## 2020-08-31 ENCOUNTER — Encounter: Payer: Self-pay | Admitting: Physician Assistant

## 2020-08-31 NOTE — Research (Addendum)
RevealPlaque Study  [facility] Informed Consent   Subject Name: Brandon [name]    Subject: [id number]  Subject met inclusion and exclusion criteria.  The informed consent form, study requirements and expectations were reviewed with the subject and questions and concerns were addressed prior to the signing of the consent form.  The subject verbalized understanding of the trial requirements.  The subject agreed to participate in the [facility] trial and signed the informed consent at 0800 on 08/16/2020.  The informed consent was obtained prior to performance of any protocol-specific procedures for the subject.  A copy of the signed informed consent was given to the subject and a copy was placed in the subject's medical record.   ,    Inclusion: '[x]'  1. Age >65 '[x]'  2. Clinically stable patient with known CAD. '[x]'   3. CCTA showing stenosis in at least one major epicardial vessel of stentable/graftable diameter, in whom clinically indicated IVUS is planned within 45 days of the CCTA  and FFR CT available. '[x]'  4. FFR CT successfully processed. '[x]'  5. Willing to comply with protocol. '[x]'  6. Agrees to be included in the study and able to provide written informed consent.  Exclusion: '[]'  1. CCTA showing no stenosis. '[]'  2. Uninterpretable CCTA by HeartFlow assessment, in which image quality prevents FFR CT from being processed. '[]'  3. Acute chest pain. '[]'  4. CABG prior to CCTA acquisition. '[]'  5. Prior history of PCI for 3 or more vessels. '[]'  6. MI less than 30 days prior to CCTA or between CCTA and ICA. '[]'  7. Suspicion of acute coronary syndrome, Acute MI or Unstable Angina. '[]'  8. Known complex congenital heart disease. '[]'  9. Tachycardia or significant arrythmia. '[]'  10. Subject requires an emergent procedure. '[]'  11. Evidence of ongoing or active clinical instability, including acute chest pain  (sudden onset), cardiogenic shock, unstable blood pressure with systolic blood pressure <91  mmHg, and severe congestive heart failure (NYHA lll or lV) or acute  pulmonary edema.  '[]'  12. Any active, serious, life-threatening disease with a life expectancy of less than 2 months. '[]'  13. Currently enrolled in another study utilizing FFR CT or in an investigational trial that involves a non-approved cardiac drug or device.  '[]'  14. Persons under the protection of justice, guardianship, or curatorship.  Reason for CCTA scan: '[x]'  Chest Pain '[]'  Asymptomatic/screening     '[]'  Prior MI     '[]'  Dyspnea   '[]'  Palpitations '[]'  Abnormal ECG    '[]'    Abnormal Stress or Perfusion   '[]'  Other Which CT Scanner was used?          '[]'  Philips Model: '[]'  CT 5000 Ingenuity    '[]'  Spectral CT 7500   '[]'  CT6000 iCT    '[]'  IQon         '[]'   Incisive CT    '[]'   Other '[x]'  Siemens Model: '[]'  Somatom Definition Edge    '[]'  Somatom Definition Flash            '[x]'  Somatom Force  '[]'  Somatom Drive   '[]'   Other '[]'  Product/process development scientist:  '[]'  Revolution CT  '[]'  CardioGraph   '[]'  Optima CT660  '[]'   Discovery HD 750   '[]'  Other Radiation Exposure for CCTA Only (Sum for All Coronary Series)  CT dose index (CTDI) (mGy)  _____79.54____________  Dose-length product (DLP) (mGy-cm) _1085.7__________   '[x]'   CCTA Image Uploaded   '[x]'  CCTA report uploaded  '[x]'  All subject information redacted from image and  report? Subject Demographics: Age: _65___    Year of birth:  ___1957___ Birth Sex: '[x]'  Male   '[]'   Male  Weight: __97__ '[]' lb  '[x]'  kg   '[]'  Not done Height: ___69___ '[x]'  in  '[]'  cm   '[]'  Not done  Ethnicity: '[x]'  Not of Hispanic, Latino/a, or Spanish origin '[]'  Hispanic, Latino/a, or Spanish origin Specify: '[]'   [place] American, Chicano/a      '[]'  [place]  '[]'  [place] '[]' other Hispanic, Latino/a, or Spanish origin Race:  '[x]'  White  '[]'  Black  '[]'  American [place] or [place] Native  '[]'  Asian:  Specify: '[]'  Asian [place]   '[]'  [place]  '[]'  Chinese  '[]'  [place]   '[]'  Filipino  '[]'  Other Asian  '[]'  [place]   '[]'  Native Hawaiian or other  Pacific Islander              '[]'  Native Hawaiian   '[]'  Guamanian or Chamorro  '[]'  Samoan   '[]'  Other Pacific Islander   '[]'  Other (specify) Medical History: Angina within the last 45 days:   '[x]'  Yes   '[]'  No If yes: '[x]'  Typical '[]'  Atypical  '[]'  Dyspnea  '[]'  Noncardiac pain Angina type: '[x]'  Stable '[]'  Unstable '[]'  Silent Ischemia  '[]'  Unknown '[]'  Other Congestive Heart Failure: '[]'  Yes '[]'  No Diabetes: '[]'  Yes  '[x]'  No If yes:  '[]'  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 medication) Is subject taking anti-hypertensive medication?   '[x]'  Yes  '[]'   No Hyperlipidemia:   '[x]'  Yes  '[]'  No Is subject taking anti-hyperlipidemic medication?    '[x]'  Yes  '[]'  No Tobacco Use:  '[]'  Former  '[]'  Current  '[x]'   Non-smoker  '[]'   Unknown Any nicotine use in 24 hours prior to CCTA?   '[]'  Yes  '[x]'   No Family history of premature atherosclerotic disease?   '[]'  Yes  '[x]'   No (Coronary artery disease, cerebrovascular disease, or peripheral vascular disease for male 2o relatives < 40 and/or male 2o relatives < 35 years old) Stroke:  '[]'  Yes  '[x]'  No Transient Ischemic Attack (TIA):   '[]'  Yes  '[x]'  No Peripheral Vascular Disease:  '[]'  Yes  '[x]'  No Sedentary Lifestyle:    '[]'  Yes   '[x]'  No Other Relevant Disease or Comorbidity:    '[]'  Yes  '[x]'  No Specify: ___________________   PCI History: Does the subject have previously stented vessels?   '[]'  Yes  '[x]'  No ______________ Total number of PCI procedures ______________ Date of most recent PCI ______________ Total number of stented vessels NOTE: CASS Segment Diagram is provided for reference in Appendix A. PCI History Records: (complete a record for each stent) Stent 1:           Vessel: '[]'  Right coronary artery  '[]'  Left main artery  '[]'  Left circumflex artery '[]'  Left anterior descending artery   '[]'  Other________________ CASS Segment for starting location of stent:  ______________________________ CASS Segment for ending location of stent: _______________________________ Stent manufacturer: _________________________________________________ Diameter of stent (mm): _____________   Length of stent (mm): _____________ Stent 2:             Vessel: '[]'  Right coronary artery  '[]'  Left main artery  '[]'  Left circumflex artery '[]'  Left anterior descending artery   '[]'  Other________________ CASS Segment for starting location of  stent: ______________________________ CASS Segment for ending location of stent: _______________________________ Stent manufacturer: _________________________________________________ Diameter of stent (mm): _____________   Length of stent (mm): _____________ Coronary Tests: Any coronary tests within 90 days prior to enrollment?  '[]'  Yes  '[]'  No Invasive Coronary Angiography (ICA):    '[]'  Yes '[]'  No Most recent test date: _________________   Result: '[]'  Positive  '[]'  Negative  '[]'  Intermediate  '[]'  Unknown '[]'  Image uploaded       Report: '[]'  Uploaded  '[]'  N/A '[]'  All subject information redacted from images and reports? Exercise Tolerance Test (ETT):  '[]' Yes  '[]'  No     Most recent test date: Click or tap to enter a date. Result:   '[]'  Positive    '[]'  Negative  '[]'  Intermediate  '[]'   Unknown '[]'  Image uploaded    Report:  '[]'  Uploaded  '[]'  N/A  '[]'   All subject information redacted from images and reports? Stress Echocardiogram: '[]'  Yes  '[]'  No   Most recent date:Click or tap to enter a date. Result:  '[]'  Positive  '[]'  Negative  '[]'  Intermediate  '[]'  Unknown '[]' Image uploaded    Report:  '[]'  Uploaded  '[]'  N/A      '[]'  All images redacted? Nuclear Myocardial Perfusion Scan:    '[]'  Yes  '[]'  No Most recent test date: Click or tap to enter a date.  Result:   '[]'  Positive    '[]'  Negative  '[]'  Intermediate  '[]'   Unknown '[]' Image uploaded    Report:  '[]'  Uploaded  '[]'  N/A      '[]'  All images redacted? Cardiac MRI:   '[]'  Yes  '[]'  No Most recent test date: Click or tap to enter a  date.  Result:   '[]'  Positive    '[]'  Negative  '[]'  Intermediate  '[]'   Unknown '[]' Image uploaded    Report:  '[]'  Uploaded  '[]'  N/A      '[]'  All images redacted? Cardiac PET:    '[]'  Yes  '[]'  No Most recent test date: Click or tap to enter a date.  Result:   '[]'  Positive    '[]'  Negative  '[]'  Intermediate  '[]'   Unknown '[]' Image uploaded    Report:  '[]'  Uploaded  '[]'  N/A      '[]'  All images redacted?  Baseline Cath Procedure: Date of procedure: 15-Aug-2020 Blood pressure prior to procedure: 142/79  Heart Rate prior to procedure:78 Time of arterial access:1528 Time of first lesion treatment: 1610 Time last guide catheter removed: ___1641____ Aortic pressure (mean mmHg): __80_____ LVED pressure (mmHg): ________12________ Arterial access site:   '[x]'  Radial  '[]'  Femoral  '[]'  Brachial Maximum arterial sheath size (Fr) ____6____ Venous access needle size:  '[]'  18 G  '[x]'  20 G  '[]'  4 FR  '[]'  5 FR  '[]'  6 FR  '[]'  Other Left ventriculography performed?  '[x]'  Yes  '[]'  No If yes, when:   '[x]'  Pre-intervention  '[]'  Post- intervention LVEF (%):____60_____ Were there any procedural complications?          '[]'  Yes  '[x]'  No '[]'  Dissection from wire  '[]'  Dissection from catheter  '[]'  Slow / no flow '[]'  Allergic reaction to medication  '[]'  Side branch occlusion  '[]'  Other  Was the FFRCT model available & used in cath lab during the procedure? '[x]'  Y  '[]'  N '[x]'  Angio image uploaded  '[x]'  Cath report uploaded  '[]'  All subject information redacted from images and reports?  Intraprocedural Medications: Was adenosine administered within  24 hrs of the cath procedure?  '[]'  Yes  '[x]'  No IV Adenosine start time: _______________ Adenosine dose:  '[]'  140 ug/kg/min  '[]'  Other dose  '[]'  None Was nitroglycerin administered within 24 hrs of the cath procedure? '[x]'  Yes '[]'  No NTG dose: ___20___ NTG Route: '[]'  IV  '[x]'  IC  '[]'  Other  Intraprocedural Devices: (total number of each device used during the procedure): ___1__ Pressure wires & FFR wires ___1___ Guidewires    (Do not include wires used before intervention and pressure/FFR wires) ___3___ Balloon catheters ___1__ Drug eluting stents ___0__ Bare metal stents ___1__ Guide catheters ___1__ Intravascular ultrasound catheters ___0__ Intravascular ultrasound OCT catheters ___0__ Thrombectomy and atherectomy catheters ___0__ Temporary pacemakers ___0__ Intra-aortic balloon pumps (IABP) Intraprocedural Radiation exposure: Total fluoroscopy time __23_______ (minutes) Absorbed Dose of Radiation _664____   '[]'  mGy  '[]'  Gy Dose Area Product: ______357.31______  '[]'  Gy*cm2  '[]'  cGy*cm2  '[]'  mGy*cm2  Other: ______________ Contrast Use: (check all that apply) '[x]'  Ominipaque  '[]'  Visipaque  '[]'   Isovue  '[]'  Iomeron  '[]'  Ultravist   '[]' Oxilan '[]'  Hypaque  '[]'  Isopaque  '[]'  Hexibrix  '[]'  OptiRay  '[]'  Iopamidol  Total amount of contrast used (mL): _____100__  IVUS/OCT: Model: '[]'  Philips Volcano                    Catheter type:    '[]'  Revolution  '[]'  Eagle Eye Platinum  '[]'  Eagle Eye  P     Platinum ST          '[]'  Refinity ST               '[x]'  [employer] Scientific                   Catheter type:  '[]'  Opticross   '[x]'  Opticross HD  '[]'  Other                '[]'  Terumo                    Catheter type:   '[]'  ViewIT    '[]'  Intrafocus WR  Ivus Image uploaded:  '[x]'  Yes  '[]'  No OCT taken?   '[]'  Yes   '[x]'  No   '[]'  All subject information redacted from images and reports?  IVUS/OCT Records Was automatic pullback used for IVUS/OCT acquisition?   '[x]'  Yes  '[]'  No       Pullback speed (mm/sec): _____________________        Length of pullback (mm): _____________________ Vessel 1: Image type:   '[x]'  IVUS   '[]'  OCT  '[x]'  Right coronary artery  '[]'  Left main artery  '[]'  Left circumflex artery '[]'  Left anterior descending artery   '[]'  Other________________ CASS Segment for starting location of guidewire: __________________________ CASS Segment for ending location of guidewire: ________________________ Guide catheter size:  '[]' 4 FR  '[]' 5 FR  '[x]' 6 FR  '[]' 7  FR  '[]' 8 FR Vessel 2: Image type:   '[]'  IVUS   '[]'  OCT  '[]'  Right coronary artery  '[]'  Left main artery  '[]'  Left circumflex artery '[]'  Left anterior descending artery   '[]'  Other________________ CASS Segment for starting location of guidewire: __________________________ CASS Segment for ending location of guidewire: ________________________ Guide catheter size:  '[]' 4 FR  '[]' 5 FR  '[]' 6 FR  '[]' 7 FR  '[]' 8 FR Vessel 3: Image type:   '[]'  IVUS   '[]'  OCT  '[]'  Right coronary artery  '[]'  Left main artery  '[]'   Left circumflex artery '[]'  Left anterior descending artery   '[]'  Other________________ CASS Segment for starting location of guidewire: __________________________ CASS Segment for ending location of guidewire: ________________________ Guide catheter size:  '[]' 4 FR  '[]' 5 FR  '[]' 6 FR  '[]' 7 FR  '[]' 8 FR FFR/NHPR Records Measurement taken during cath procedure: '[]'  FFR  '[]'  DFR  '[]'  IFR  '[]'  RFR   '[]' Other Vessel 1: '[]'  Right coronary artery  '[]'  Left main artery  '[]'  Left circumflex artery '[]'  Left anterior descending artery   '[]'  Other________________ Contrast used?     '[]'  Yes   '[]'  No CASS Segment for starting location of guide wire: __________________________ CASS Segment for ending location of guide wire: ___________________________ Guide catheter size:  '[]' 4 FR  '[]' 5 FR  '[]' 6 FR  '[]' 7 FR  '[]' 8 FR FFR Value: _____________ Pd value: ______________ Pa value: ______________ NHPR value: ____________ Vessel 2: '[]'  Right coronary artery  '[]'  Left main artery  '[]'  Left circumflex artery '[]'  Left anterior descending artery   '[]'  Other________________ Contrast used?     '[]'  Yes   '[]'  No CASS Segment for starting location of guide wire: __________________________ CASS Segment for ending location of guide wire: ___________________________ Guide catheter size:  '[]' 4 FR  '[]' 5 FR  '[]' 6 FR  '[]' 7 FR  '[]' 8 FR FFR Value: _____________ Pd value: ______________ Pa value: ______________ NHPR value: ____________  Vessel 3: '[]'  Right coronary artery  '[]'   Left main artery  '[]'  Left circumflex artery '[]'  Left anterior descending artery   '[]'  Other________________ Contrast used?     '[]'  Yes   '[]'  No CASS Segment for starting location of guide wire: __________________________ CASS Segment for ending location of guide wire: ___________________________ Guide catheter size:  '[]' 4 FR  '[]' 5 FR  '[]' 6 FR  '[]' 7 FR  '[]' 8 FR FFR Value: _____________ Pd value: ______________ Pa value: ______________ NHPR value: ____________ FFR Uploads '[]'  Angio Image  '[]'  FFR Report  '[]'  FFR Tracings  '[]'  Screenshot of Angio - starting position of FFR guidewire without contrast '[]'  Screenshot of Angio - starting position of FFR guidewire with contrast  NHPR Uploads '[]'  Angio Image  '[]'  NHPR Report  '[]'  NHPR Tracings  '[]'  Screenshot of Angio - starting position of NHPR guidewire without contrast '[]'  Screenshot of Angio - starting position of NHPR guidewire with contrast '[]'  All subject information redacted from images and reports?  Study completion:  Did subject complete participation in the study?    '[x]'  Yes  '[]'  No If no, reason:                         '[]'  Death '[]'  Withdrew consent  '[]'  Other  '[]'  Screen Failure Screen Failure reason:  '[]'  Ivus not used  '[]'  Use of balloon prior  '[]'  Automatic pullback not used

## 2020-09-01 ENCOUNTER — Other Ambulatory Visit: Payer: Self-pay | Admitting: Family Medicine

## 2020-09-01 ENCOUNTER — Telehealth (INDEPENDENT_AMBULATORY_CARE_PROVIDER_SITE_OTHER): Payer: No Typology Code available for payment source | Admitting: Family Medicine

## 2020-09-01 ENCOUNTER — Encounter: Payer: Self-pay | Admitting: Family Medicine

## 2020-09-01 DIAGNOSIS — E785 Hyperlipidemia, unspecified: Secondary | ICD-10-CM

## 2020-09-01 DIAGNOSIS — I25110 Atherosclerotic heart disease of native coronary artery with unstable angina pectoris: Secondary | ICD-10-CM | POA: Diagnosis not present

## 2020-09-01 DIAGNOSIS — F5101 Primary insomnia: Secondary | ICD-10-CM | POA: Diagnosis not present

## 2020-09-01 DIAGNOSIS — E1169 Type 2 diabetes mellitus with other specified complication: Secondary | ICD-10-CM

## 2020-09-01 MED ORDER — TESTOSTERONE CYPIONATE 200 MG/ML IJ SOLN: 200.0000 mg | INTRAMUSCULAR | 1 refills | Status: DC

## 2020-09-01 MED ORDER — TRAZODONE HCL 50 MG PO TABS: 50.0000 mg | ORAL_TABLET | Freq: Every evening | ORAL | 3 refills | Status: DC | PRN

## 2020-09-01 MED FILL — traZODone HCL 50 MG TABS: 50 | 30 days supply | Qty: 60 | Fill #0

## 2020-09-01 NOTE — Progress Notes (Signed)
Morning glucose: 82  Sleeping only 4 -5 hours since cardiac event.

## 2020-09-03 DIAGNOSIS — G4700 Insomnia, unspecified: Secondary | ICD-10-CM | POA: Insufficient documentation

## 2020-09-03 NOTE — Progress Notes (Signed)
Brandon [name] - 65 y.o. male MRN [id number]  Date of birth: [birth year]   This visit type was conducted due to national recommendations for restrictions regarding the COVID-19 Pandemic (e.g. social distancing).  This format is felt to be most appropriate for this patient at this time.  All issues noted in this document were discussed and addressed.  No physical exam was performed (except for noted visual exam findings with Video Visits).  I discussed the limitations of evaluation and management by telemedicine and the availability of in person appointments. The patient expressed understanding and agreed to proceed.  I connected with@ on 09/03/20 at 10:10 AM EST by a video enabled telemedicine application and verified that I am speaking with the correct person using two identifiers.  Present at visit: Brandon Coombe, DO [name]   Patient Location: Home [address] [place] [address]   Provider location:   [facility]  Chief Complaint  Patient presents with  . Diabetes    HPI  Brandon [name] is a 65 y.o. male who presents via audio/video conferencing for a telehealth visit today.  He is following up today from recent hospital visit.  Seen in hospital for chest pain. EKG non-ischemic and negative troponins.  Coronary CT concerning for 3V CAD.  Underwent cardiac cath showing significant CAD.  PCI/DES to pRCA lesion.  Other lesions felt to be too small for intervention.  He was started on DAPT with plavix and ASA as well as low dose metoprolol.  Lovastatin changed to atorvstatin.  He has tolerated this well so far and reports improvement of his chest pain and dyspnea.  If is able to complete tasks without getting winded easily.  He has been referred to cardiac rehab.    A1c elevated in the hospital at 10.1%, up from 7.2% 4 months ago.  He has been referred to endocrinology.   Increased insomnia since hospitalization.  Has history of depression which remains well controlled with  bupropion and abilify.  He has never tried anything for sleep in the past.     ROS:  A comprehensive ROS was completed and negative except as noted per HPI  Past Medical History:  Diagnosis Date  . Anxiety   . Basal cell carcinoma (BCC) in situ of skin   . Colon polyps   . Depression   . Diabetes mellitus without complication (HCC)   . Hypertension   . SBO (small bowel obstruction) (HCC)     Past Surgical History:  Procedure Laterality Date  . BACK SURGERY    . CATARACT EXTRACTION W/ INTRAOCULAR LENS  IMPLANT, BILATERAL    . CHOLECYSTECTOMY    . CHOLECYSTECTOMY, LAPAROSCOPIC    . CORONARY STENT INTERVENTION N/A 08/15/2020   Procedure: CORONARY STENT INTERVENTION;  Surgeon: Corky Crafts, MD;  Location: [facility] INVASIVE CV LAB;  Service: Cardiovascular;  Laterality: N/A;  . EYE SURGERY    . INTRAVASCULAR ULTRASOUND/IVUS N/A 08/15/2020   Procedure: Intravascular Ultrasound/IVUS;  Surgeon: Corky Crafts, MD;  Location: [facility] INVASIVE CV LAB;  Service: Cardiovascular;  Laterality: N/A;  . LEFT HEART CATH AND CORONARY ANGIOGRAPHY N/A 08/15/2020   Procedure: LEFT HEART CATH AND CORONARY ANGIOGRAPHY;  Surgeon: Corky Crafts, MD;  Location: [facility] INVASIVE CV LAB;  Service: Cardiovascular;  Laterality: N/A;  . LUMBAR FUSION     L3-5 with rods  . XI ROBOTIC ASSISTED INGUINAL HERNIA REPAIR WITH MESH     x 3 surgeries    Family History  Problem Relation Age of Onset  . COPD Mother   . Anxiety disorder Mother   . Depression Mother   . Cancer Father   . Anxiety disorder Father   . Depression Father   . Other Father     Social History   Socioeconomic History  . Marital status: Married    Spouse name: Not on file  . Number of children: Not on file  . Years of education: Not on file  . Highest education level: Not on file  Occupational History  . Not on file  Tobacco Use  . Smoking status: Former Games developer  . Smokeless tobacco: Never Used  Substance and Sexual  Activity  . Alcohol use: Not Currently  . Drug use: Not Currently  . Sexual activity: Yes  Other Topics Concern  . Not on file  Social History Narrative  . Not on file   Social Determinants of Health   Financial Resource Strain: Not on file  Food Insecurity: Not on file  Transportation Needs: Not on file  Physical Activity: Not on file  Stress: Not on file  Social Connections: Not on file  Intimate Partner Violence: Not on file     Current Outpatient Medications:  .  amLODipine (NORVASC) 10 MG tablet, Take 10 mg by mouth every morning., Disp: , Rfl:  .  ARIPiprazole (ABILIFY) 10 MG tablet, Take 10 mg by mouth every morning., Disp: , Rfl:  .  aspirin 81 MG EC tablet, Take 81 mg by mouth every morning., Disp: , Rfl:  .  atorvastatin (LIPITOR) 80 MG tablet, Take 1 tablet (80 mg total) by mouth daily., Disp: 90 tablet, Rfl: 3 .  Blood Pressure Monitoring (OMRON 3 SERIES BP MONITOR) DEVI, , Disp: , Rfl:  .  buPROPion (WELLBUTRIN XL) 150 MG 24 hr tablet, Take 150 mg by mouth every morning. Take with a 300 mg tablet for a total dose of 450 mg daily, Disp: , Rfl:  .  buPROPion (WELLBUTRIN XL) 300 MG 24 hr tablet, Take 300 mg by mouth every morning. Take with a 150 mg tablet for a total dose of 450 mg daily, Disp: , Rfl:  .  clopidogrel (PLAVIX) 75 MG tablet, Take 1 tablet (75 mg total) by mouth daily with breakfast., Disp: 90 tablet, Rfl: 3 .  empagliflozin (JARDIANCE) 25 MG TABS tablet, Take 25 mg by mouth every morning., Disp: , Rfl:  .  insulin degludec (TRESIBA FLEXTOUCH) 100 UNIT/ML FlexTouch Pen, Inject 60 Units into the skin daily. (Patient taking differently: Inject 60 Units into the skin daily before breakfast.), Disp: 54 mL, Rfl: 1 .  insulin lispro (HUMALOG) 100 UNIT/ML KwikPen, Inject 10-24 Units into the skin 3 (three) times daily before meals. Per sliding scale - 1 unit per 7 carbs, Disp: , Rfl:  .  metoCLOPramide (REGLAN) 10 MG tablet, Take 10 mg by mouth daily as needed for  nausea or vomiting (upset stomach). PRN, Disp: , Rfl:  .  metoprolol tartrate (LOPRESSOR) 25 MG tablet, Take 1 tablet (25 mg total) by mouth 2 (two) times daily., Disp: 180 tablet, Rfl: 3 .  nitroGLYCERIN (NITROSTAT) 0.4 MG SL tablet, Place 1 tablet (0.4 mg total) under the tongue every 5 (five) minutes as needed for chest pain., Disp: 60 tablet, Rfl: 0 .  ondansetron (ZOFRAN ODT) 4 MG disintegrating tablet, Take 1 tablet (4 mg total) by mouth every 8 (eight) hours as needed for nausea or vomiting., Disp: 20 tablet, Rfl: 0 .  pantoprazole (PROTONIX) 40  MG tablet, Take 40 mg by mouth every morning., Disp: , Rfl:  .  pioglitazone (ACTOS) 30 MG tablet, Take 30 mg by mouth every morning., Disp: , Rfl:  .  tamsulosin (FLOMAX) 0.4 MG CAPS capsule, Take 0.4 mg by mouth every morning., Disp: , Rfl:  .  tiZANidine (ZANAFLEX) 4 MG tablet, Take 1 tablet (4 mg total) by mouth every 8 (eight) hours as needed for muscle spasms. (Patient taking differently: Take 4 mg by mouth 3 (three) times daily.), Disp: 90 tablet, Rfl: 11 .  traMADol (ULTRAM) 50 MG tablet, Take 1 tablet (50 mg total) by mouth 3 (three) times daily as needed. (Patient taking differently: Take 50-100 mg by mouth every 8 (eight) hours.), Disp: 90 tablet, Rfl: 3 .  traZODone (DESYREL) 50 MG tablet, Take 1-2 tablets (50-100 mg total) by mouth at bedtime as needed for sleep., Disp: 60 tablet, Rfl: 3 .  triazolam (HALCION) 0.25 MG tablet, 1-2 tabs PO 2 hours before procedure or imaging.  Do not drive with this medication. (Patient taking differently: Take 0.25-0.5 mg by mouth See admin instructions. Take 1-2 tablets (0.25 -0.5 mg) by mouth 2 hours before procedure or imaging.  Do not drive with this medication.), Disp: 2 tablet, Rfl: 0 .  Testosterone Cypionate 200 MG/ML SOLN, Inject 200 mg into the muscle every Sunday., Disp: 5 mL, Rfl: 1  EXAM:  VITALS per patient if applicable: BP (!) 143/86   Temp 97.8 F (36.6 C)   Wt 217 lb (98.4 kg)   BMI  32.05 kg/m   GENERAL: alert, oriented, appears well and in no acute distress  HEENT: atraumatic, conjunttiva clear, no obvious abnormalities on inspection of external nose and ears  NECK: normal movements of the head and neck  LUNGS: on inspection no signs of respiratory distress, breathing rate appears normal, no obvious gross SOB, gasping or wheezing  CV: no obvious cyanosis  MS: moves all visible extremities without noticeable abnormality  PSYCH/NEURO: pleasant and cooperative, no obvious depression or anxiety, speech and thought processing grossly intact  ASSESSMENT AND PLAN:  Discussed the following assessment and plan:  Coronary artery disease involving native coronary artery of native heart with unstable angina pectoris (HCC) S/p DES to the pRCA.  Doing well with plavix + ASA.  Anginal symptoms improved.   Tolerating change to atorvastatin so far. He has been referred to cardiac rehab.    Type 2 diabetes mellitus with other specified complication (HCC) Worsening control of diabetes over the past few months.  Has upcoming appt with endocrinology.   Hyperlipidemia associated with type 2 diabetes mellitus (HCC) Tolerating change to atorvastatin.  Future labs ordered by cardiology.    Insomnia Trial of trazodone sent in.  Side effects reviewed.       I discussed the assessment and treatment plan with the patient. The patient was provided an opportunity to ask questions and all were answered. The patient agreed with the plan and demonstrated an understanding of the instructions.   The patient was advised to call back or seek an in-person evaluation if the symptoms worsen or if the condition fails to improve as anticipated.    Brandon Coombe, DO

## 2020-09-03 NOTE — Assessment & Plan Note (Addendum)
S/p DES to the pRCA.  Doing well with plavix + ASA.  Anginal symptoms improved.   Tolerating change to atorvastatin so far. He has been referred to cardiac rehab.

## 2020-09-03 NOTE — Assessment & Plan Note (Signed)
Worsening control of diabetes over the past few months.  Has upcoming appt with endocrinology.

## 2020-09-03 NOTE — Assessment & Plan Note (Signed)
Tolerating change to atorvastatin.  Future labs ordered by cardiology.

## 2020-09-03 NOTE — Assessment & Plan Note (Signed)
Trial of trazodone sent in.  Side effects reviewed.

## 2020-09-04 ENCOUNTER — Telehealth: Payer: Self-pay | Admitting: Neurology

## 2020-09-04 ENCOUNTER — Telehealth (HOSPITAL_COMMUNITY): Payer: Self-pay | Admitting: *Deleted

## 2020-09-04 NOTE — Telephone Encounter (Signed)
Pt with referral to participate in cardiac rehab s/p 12/21 DES RCA.  Pt completed his follow up appt with cardiology.  Reviewed progress note.  Pt with significant increase for hie A1C 10.  For participation in cardiac rehab, cbg readings must be below 300.  Called and spoke to pt who admitted that prior to the admission he wasn't consistently taking his DM medications and added real soda.  Pt reports complete compliance to diet and medication.  Blood glucose at home runs upper 80's fasting to mid 120's later in the day.  Based upon his reported blood glucose readings and upcoming appt on 2/17 with endocrinology as new consult, pt may proceed with scheduling CR. Will forward to support staff to contact for insurance benefits and scheduling.  Advised pt that we are scheduling for the end of February.  Asked pt what he was doing for exercise.  Pt walks his dog a couple of times a day.  The walk is short with frequent stops. Reviewed with pt exercise guidelines with incorporating additional solo walks with adding time each day with goal of 30 minutes twice a day.  Pt verbalized understanding. Alanson Aly, BSN Cardiac and Emergency planning/management officer

## 2020-09-04 NOTE — Telephone Encounter (Signed)
Prior Authorization for Testosterone submitted via covermymeds. Awaiting response.

## 2020-09-07 ENCOUNTER — Telehealth: Payer: Self-pay

## 2020-09-07 NOTE — Telephone Encounter (Signed)
Patient never go referral number from [facility] for his appointment. Everything on our end was complete. I called patient and let him know what he needed to do. - CF

## 2020-09-07 NOTE — Telephone Encounter (Signed)
Patient called stating that he got a call from [facility] that they do not have a referral for him to see Dr. Dorene Ar? There is a referral in there from November 2021. Please help sort this out.   Thanks,  Brigette Hopfer

## 2020-09-11 MED FILL — ATORVASTATIN 80 MG TABLET: 80 | 90 days supply | Qty: 90 | Fill #0

## 2020-09-11 MED FILL — METOPROLOL TARTRATE 25 MG T: 25 | 90 days supply | Qty: 180 | Fill #0

## 2020-09-11 MED FILL — CLOPIDOGREL 75 MG TABLET: 75 | 90 days supply | Qty: 90 | Fill #0

## 2020-09-11 MED FILL — TRESIBA FLEXTOUCH 100 UNITS: 100 | 25 days supply | Qty: 15 | Fill #1

## 2020-09-19 ENCOUNTER — Other Ambulatory Visit (HOSPITAL_COMMUNITY): Payer: Self-pay | Admitting: Pain Medicine

## 2020-09-19 DIAGNOSIS — M5416 Radiculopathy, lumbar region: Secondary | ICD-10-CM

## 2020-09-21 ENCOUNTER — Telehealth (HOSPITAL_COMMUNITY): Payer: Self-pay

## 2020-09-21 NOTE — Telephone Encounter (Signed)
Pt insurance is active and benefits verified through [employer]. Co-pay $0.00, DED $0.00/$0.00 met, out of pocket $0.00/$0.00 met, co-insurance 0%. No pre-authorization required. [employer], 09/21/20 @ 316PM, REF#[id number]  Will contact patient to see if he is interested in the Cardiac Rehab Program. If interested, patient will need to complete follow up appt. Once completed, patient will be contacted for scheduling upon review by the RN Navigator. 

## 2020-09-21 NOTE — Telephone Encounter (Signed)
Called patient to see if he was interested in participating in the Cardiac Rehab Program. Patient stated yes. Patient will come in for orientation on 11/07/20 @ 10:30AM and will attend the 10:45AM exercise class. Went over insurance, patient verbalized understanding.   Pensions consultant.

## 2020-09-27 ENCOUNTER — Other Ambulatory Visit: Payer: Self-pay | Admitting: Family Medicine

## 2020-09-27 ENCOUNTER — Encounter: Payer: Self-pay | Admitting: Family Medicine

## 2020-09-27 DIAGNOSIS — E291 Testicular hypofunction: Secondary | ICD-10-CM

## 2020-09-29 MED FILL — traMADol HCL 50 MG TABS: 50 | 30 days supply | Qty: 90 | Fill #0

## 2020-09-29 MED FILL — tiZANidine HCL 4 MG TABS: 4 | 30 days supply | Qty: 90 | Fill #1

## 2020-10-04 ENCOUNTER — Other Ambulatory Visit: Payer: Self-pay | Admitting: Family Medicine

## 2020-10-04 MED ORDER — CONTOUR NEXT TEST VI STRP: ORAL_STRIP | 12 refills | Status: DC

## 2020-10-05 ENCOUNTER — Other Ambulatory Visit: Payer: Self-pay | Admitting: Family Medicine

## 2020-10-05 DIAGNOSIS — E291 Testicular hypofunction: Secondary | ICD-10-CM

## 2020-10-05 LAB — TESTOSTERONE: Testosterone: 176 ng/dL — ABNORMAL LOW (ref 250–827)

## 2020-10-06 ENCOUNTER — Ambulatory Visit (HOSPITAL_COMMUNITY): Admission: RE | Admit: 2020-10-06 | Payer: No Typology Code available for payment source | Source: Ambulatory Visit

## 2020-10-06 ENCOUNTER — Other Ambulatory Visit: Payer: Self-pay

## 2020-10-06 ENCOUNTER — Other Ambulatory Visit: Payer: Self-pay | Admitting: Family Medicine

## 2020-10-06 ENCOUNTER — Encounter (HOSPITAL_COMMUNITY): Payer: Self-pay

## 2020-10-06 DIAGNOSIS — E1169 Type 2 diabetes mellitus with other specified complication: Secondary | ICD-10-CM

## 2020-10-06 LAB — TESTOSTERONE: Testosterone: 172 ng/dL — ABNORMAL LOW (ref 250–827)

## 2020-10-06 MED ORDER — FREESTYLE LITE W/DEVICE KIT: 1.0000 | PACK | Freq: Three times a day (TID) | 99 refills | Status: DC | PRN

## 2020-10-06 MED ORDER — FREESTYLE LANCETS MISC: 12 refills | Status: DC

## 2020-10-06 MED ORDER — FREESTYLE LITE TEST VI STRP: ORAL_STRIP | 12 refills | Status: DC

## 2020-10-06 MED FILL — FREESTYLE LANCETS: 60 days supply | Qty: 300 | Fill #0

## 2020-10-06 MED FILL — FREESTYLE LITE TEST STRIP: 60 days supply | Qty: 300 | Fill #0

## 2020-10-06 MED FILL — FREESTYLE LITE METER: W/DEVICE | 1 days supply | Qty: 1 | Fill #0

## 2020-10-12 ENCOUNTER — Ambulatory Visit: Payer: Medicare Other | Admitting: Endocrinology

## 2020-10-14 MED FILL — TRESIBA FLEXTOUCH 100 UNITS: 100 | 25 days supply | Qty: 15 | Fill #2

## 2020-10-17 MED FILL — TESTOSTERONE CYP 200 MG/ML: 200 | 28 days supply | Qty: 4 | Fill #0

## 2020-10-18 ENCOUNTER — Other Ambulatory Visit: Payer: Self-pay

## 2020-10-18 ENCOUNTER — Ambulatory Visit (HOSPITAL_COMMUNITY)
Admission: RE | Admit: 2020-10-18 | Discharge: 2020-10-18 | Disposition: A | Discharge status: Home / Self Care | Payer: No Typology Code available for payment source | Source: Ambulatory Visit | Attending: Pain Medicine | Admitting: Pain Medicine

## 2020-10-18 DIAGNOSIS — M5416 Radiculopathy, lumbar region: Secondary | ICD-10-CM | POA: Diagnosis present

## 2020-10-18 DIAGNOSIS — E291 Testicular hypofunction: Secondary | ICD-10-CM

## 2020-10-18 MED ORDER — TESTOSTERONE CYPIONATE 200 MG/ML IJ SOLN: 200.0000 mg | INTRAMUSCULAR | 1 refills | Status: DC

## 2020-10-18 MED ORDER — GADOBUTROL 1 MMOL/ML IV SOLN
9.0000 mL | Freq: Once | INTRAVENOUS | Status: AC | PRN
  Administered 2020-10-18: 9 mL via INTRAVENOUS

## 2020-10-20 ENCOUNTER — Encounter: Payer: Self-pay | Admitting: Family Medicine

## 2020-10-23 ENCOUNTER — Other Ambulatory Visit: Payer: Self-pay | Admitting: Physician Assistant

## 2020-10-24 LAB — HEPATIC FUNCTION PANEL
ALT: 12 IU/L (ref 0–44)
AST: 20 IU/L (ref 0–40)
Albumin: 4.5 g/dL (ref 3.8–4.8)
Alkaline Phosphatase: 73 IU/L (ref 44–121)
Bilirubin Total: 0.4 mg/dL (ref 0.0–1.2)
Bilirubin, Direct: 0.15 mg/dL (ref 0.00–0.40)
Total Protein: 7 g/dL (ref 6.0–8.5)

## 2020-10-24 LAB — LIPID PANEL
Chol/HDL Ratio: 2.5 ratio (ref 0.0–5.0)
Cholesterol, Total: 113 mg/dL (ref 100–199)
HDL: 45 mg/dL (ref >39)
LDL Chol Calc (NIH): 53 mg/dL (ref 0–99)
Triglycerides: 74 mg/dL (ref 0–149)
VLDL Cholesterol Cal: 15 mg/dL (ref 5–40)

## 2020-10-25 ENCOUNTER — Other Ambulatory Visit: Payer: Self-pay | Admitting: Family Medicine

## 2020-10-25 DIAGNOSIS — E291 Testicular hypofunction: Secondary | ICD-10-CM

## 2020-10-25 MED ORDER — TESTOSTERONE CYPIONATE 200 MG/ML IJ SOLN: 200.0000 mg | INTRAMUSCULAR | 1 refills | Status: DC

## 2020-10-25 MED FILL — ARIPIPRAZOLE 10 MG TABS: 10 | 90 days supply | Qty: 90 | Fill #0

## 2020-10-25 MED FILL — tiZANidine HCL 4 MG TABS: 4 | 30 days supply | Qty: 90 | Fill #2

## 2020-10-25 MED FILL — traMADol HCL 50 MG TABS: 50 | 30 days supply | Qty: 90 | Fill #1

## 2020-10-27 ENCOUNTER — Other Ambulatory Visit: Payer: Self-pay | Admitting: Sports Medicine

## 2020-10-27 ENCOUNTER — Other Ambulatory Visit: Payer: Self-pay

## 2020-10-27 ENCOUNTER — Ambulatory Visit (INDEPENDENT_AMBULATORY_CARE_PROVIDER_SITE_OTHER): Payer: No Typology Code available for payment source | Admitting: Sports Medicine

## 2020-10-27 DIAGNOSIS — M961 Postlaminectomy syndrome, not elsewhere classified: Secondary | ICD-10-CM

## 2020-10-27 DIAGNOSIS — I25110 Atherosclerotic heart disease of native coronary artery with unstable angina pectoris: Secondary | ICD-10-CM

## 2020-10-27 MED ORDER — TRIAZOLAM 0.25 MG PO TABS: ORAL_TABLET | ORAL | 0 refills | Status: DC

## 2020-10-27 MED FILL — TRIAZOLAM 0.25 MG TABS: 0.25 | 1 days supply | Qty: 2 | Fill #0

## 2020-10-27 NOTE — Assessment & Plan Note (Signed)
Brandon [name] returns, he is a pleasant 65 year old male with failed back surgery syndrome, multiple interventions into the lumbar spine including spinal cord stimulator that provided some relief, he also had left and right sacroiliac joint pain, I performed bilateral injections at the end of last year, he had about 24 hours of relief. He ultimately went back to his neurosurgeon, they ordered another MRI that showed a solid fusion but fairly severe L5-S1 facet arthritis bilaterally. I think this should be our next target. I am going to order bilateral L5-S1 facet injections with medial branch blocks and radiofrequency ablation if he gets good relief/better relief then he got for his sacroiliac joint injections. He is currently doing 6 tramadols a day and 6 tizanidines today, I think this is fine for the time being. Adding triazolam for preprocedural anxiolysis. Return to see me 1 month after injections.

## 2020-10-27 NOTE — Progress Notes (Signed)
    Procedures performed today:    None.  Independent interpretation of notes and tests performed by another provider:   MRI personally reviewed, solid fusion, L5-S1 facet arthritis as adjacent level disease.  Discs look good.  Brief History, Exam, Impression, and Recommendations:    Failed back syndrome of lumbar spine Brandon [name] returns, he is a pleasant 65 year old male with failed back surgery syndrome, multiple interventions into the lumbar spine including spinal cord stimulator that provided some relief, he also had left and right sacroiliac joint pain, I performed bilateral injections at the end of last year, he had about 24 hours of relief. He ultimately went back to his neurosurgeon, they ordered another MRI that showed a solid fusion but fairly severe L5-S1 facet arthritis bilaterally. I think this should be our next target. I am going to order bilateral L5-S1 facet injections with medial branch blocks and radiofrequency ablation if he gets good relief/better relief then he got for his sacroiliac joint injections. He is currently doing 6 tramadols a day and 6 tizanidines today, I think this is fine for the time being. Adding triazolam for preprocedural anxiolysis. Return to see me 1 month after injections.    ___________________________________________ Brandon [name]. Brandon [name], M.D., ABFM., CAQSM. Primary Care and Sports Medicine [facility]  Adjunct Instructor of Family Medicine  [employer]

## 2020-11-02 ENCOUNTER — Telehealth: Payer: Self-pay | Admitting: Sports Medicine

## 2020-11-02 NOTE — Telephone Encounter (Signed)
Received a phone call from insurance company pain management Dr. Pearlie Oyster at phone number [phone], we discussed the case, he will forward the information from our peer-to-peer to the insurance carrier and we should hear back at some point regarding approval or denial of his facet joint injections.

## 2020-11-02 NOTE — Telephone Encounter (Signed)
I don't know anything about this since I don't do those types of authorizations but thanks for the update.

## 2020-11-03 ENCOUNTER — Telehealth (HOSPITAL_COMMUNITY): Payer: Self-pay

## 2020-11-03 NOTE — Telephone Encounter (Signed)
Cardiac Rehab Medication Review by a Pharmacist  Does the patient  feel that his/her medications are working for him/her?  yes  Has the patient been experiencing any side effects to the medications prescribed?  no  Does the patient measure his/her own blood pressure or blood glucose at home?  yes - the patient reports that their average blood pressure readings range from 130-158/80-88. The patient also states that their fasting AM blood glucose readings range from 94-120 and their 2-hour post-prandial readings range from 140-200.  Does the patient have any problems obtaining medications due to transportation or finances?   no  Understanding of regimen: good Understanding of indications: good Potential of compliance: good   Sanda Klein, PharmD, RPh  PGY-1 Pharmacy Resident 11/03/2020 4:36 PM

## 2020-11-07 ENCOUNTER — Encounter (HOSPITAL_COMMUNITY)
Admission: RE | Admit: 2020-11-07 | Discharge: 2020-11-07 | Disposition: A | Discharge status: Home / Self Care | Payer: No Typology Code available for payment source | Source: Ambulatory Visit | Attending: Cardiology | Admitting: Cardiology

## 2020-11-07 ENCOUNTER — Encounter (HOSPITAL_COMMUNITY): Payer: Self-pay

## 2020-11-07 ENCOUNTER — Other Ambulatory Visit: Payer: Self-pay

## 2020-11-07 VITALS — BP 102/60 | HR 79 | Ht 70.0 in | Wt 241.8 lb

## 2020-11-07 DIAGNOSIS — Z955 Presence of coronary angioplasty implant and graft: Secondary | ICD-10-CM | POA: Insufficient documentation

## 2020-11-07 HISTORY — DX: Atherosclerotic heart disease of native coronary artery without angina pectoris: I25.10

## 2020-11-07 NOTE — Progress Notes (Addendum)
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  amLODipine (NORVASC) 10 MG tablet, Take 10 mg by mouth every morning., Disp: , Rfl:  .  ARIPiprazole (ABILIFY) 10 MG tablet, Take 10 mg by mouth every morning., Disp: , Rfl:  .  aspirin 81 MG EC tablet, Take 81 mg by mouth every morning., Disp: , Rfl:  .  atorvastatin (LIPITOR) 80 MG tablet, Take 1 tablet (80 mg total) by mouth daily., Disp: 90 tablet, Rfl: 3 .  Blood Glucose Monitoring Suppl (FREESTYLE LITE) w/Device KIT, 1 each by Does not apply route 3 (three) times daily as needed., Disp: 1 kit, Rfl: prn .  Blood Pressure Monitoring (OMRON 3 SERIES BP MONITOR) DEVI, , Disp: , Rfl:  .  buPROPion (WELLBUTRIN XL) 150 MG 24 hr tablet, Take 150 mg by mouth every morning. Take with a 300 mg tablet for a total dose of 450 mg daily, Disp: , Rfl:  .  buPROPion (WELLBUTRIN XL) 300 MG 24 hr tablet, Take 300 mg by mouth every morning. Take with a 150 mg tablet for a total dose of 450 mg daily, Disp: , Rfl:  .  clopidogrel (PLAVIX) 75 MG tablet, Take 1 tablet (75 mg total) by mouth daily with breakfast., Disp: 90 tablet, Rfl: 3 .  empagliflozin (JARDIANCE) 25 MG TABS tablet, Take 25 mg by mouth every morning., Disp: , Rfl:  .  glucose blood (FREESTYLE LITE) test strip, Dx DM E11.69 Check blood glucose up to 5 times daily., Disp: 300 each, Rfl: 12 .  insulin lispro (HUMALOG) 100 UNIT/ML KwikPen, Inject 10-24 Units into the skin 3  (three) times daily before meals. Per sliding scale - 1 unit per 5 carbs, Disp: , Rfl:  .  Lancets (FREESTYLE) lancets, Dx DM E11.69 Check blood glucose up to 5 times daily., Disp: 300 each, Rfl: 12 .  metoCLOPramide (REGLAN) 10 MG tablet, Take 10 mg by mouth daily as needed for nausea or vomiting (upset stomach). PRN, Disp: , Rfl:  .  metoprolol tartrate (LOPRESSOR) 25 MG tablet, Take 1 tablet (25 mg total) by mouth 2 (two) times daily., Disp: 180 tablet, Rfl: 3 .  nitroGLYCERIN (NITROSTAT) 0.4 MG SL tablet, Place 1 tablet (0.4 mg total) under the tongue every 5 (five) minutes as needed for chest pain., Disp: 60 tablet, Rfl: 0 .  ondansetron (ZOFRAN ODT) 4 MG disintegrating tablet, Take 1 tablet (4 mg total) by mouth every 8 (eight) hours as needed for nausea or vomiting., Disp: 20 tablet, Rfl: 0 .  pantoprazole (PROTONIX) 40 MG tablet, Take 40 mg by mouth every morning., Disp: , Rfl:  .  pioglitazone (ACTOS) 30 MG tablet, Take 30 mg by mouth every morning., Disp: , Rfl:  .  tamsulosin (FLOMAX) 0.4 MG CAPS capsule, Take 0.4 mg by mouth every morning., Disp: , Rfl:  .  Testosterone Cypionate 200 MG/ML SOLN, Inject 200 mg into the muscle every Sunday., Disp: 10 mL, Rfl: 1 .  tiZANidine (ZANAFLEX) 4 MG  tablet, Take 2 tablets (8 mg total) by mouth every 8 (eight) hours as needed for muscle spasms., Disp: , Rfl:  .  traMADol (ULTRAM) 50 MG tablet, Take 2 tablets (100 mg total) by mouth 3 (three) times daily as needed., Disp: , Rfl:  .  traZODone (DESYREL) 50 MG tablet, Take 1-2 tablets (50-100 mg total) by mouth at bedtime as needed for sleep. (Patient not taking: Reported on 11/03/2020), Disp: 60 tablet, Rfl: 3 .  TRESIBA FLEXTOUCH 100 UNIT/ML FlexTouch Pen, Inject 60 Units into the skin daily., Disp: , Rfl:  .  triazolam (HALCION) 0.25 MG tablet, 1-2 tabs PO 2 hours before procedure or imaging.  Do not drive with this medication., Disp: 2 tablet, Rfl: 0  Past Medical History: Past Medical History:   Diagnosis Date  . Anxiety   . Basal cell carcinoma (BCC) in situ of skin   . Colon polyps   . Coronary artery disease   . Depression   . Diabetes mellitus without complication ([place])   . Hypertension   . SBO (small bowel obstruction) ([facility])     Tobacco Use: Social History   Tobacco Use  Smoking Status Former Smoker  . Quit date: 2007  . Years since quitting: 15.2  Smokeless Tobacco Never Used    Labs: Recent Chemical engineer    Labs for ITP Cardiac and Pulmonary Rehab Latest Ref Rng & Units 04/07/2020 08/13/2020 10/23/2020   Cholestrol 100 - 199 mg/dL - 160 113   LDLCALC 0 - 99 mg/dL - 76 53   HDL >39 mg/dL - 32(L) 45   Trlycerides 0 - 149 mg/dL - 258(H) 74   Hemoglobin A1c 4.8 - 5.6 % 7.2(H) 10.1(H) -      Capillary Blood Glucose: Lab Results  Component Value Date   GLUCAP 135 (H) 08/16/2020   GLUCAP 151 (H) 08/16/2020   GLUCAP 207 (H) 08/15/2020   GLUCAP 79 08/15/2020   GLUCAP 91 08/15/2020     Exercise Target Goals: Exercise Program Goal: Individual exercise prescription set using results from initial 6 min walk test and THRR while considering  patient's activity barriers and safety.   Exercise Prescription Goal: Starting with aerobic activity 30 plus minutes a day, 3 days per week for initial exercise prescription. Provide home exercise prescription and guidelines that participant acknowledges understanding prior to discharge.  Activity Barriers & Risk Stratification:  Activity Barriers & Cardiac Risk Stratification - 11/07/20 1044      Activity Barriers & Cardiac Risk Stratification   Activity Barriers Arthritis;Back Problems;Joint Problems;Fibromyalgia;Other (comment)    Comments Back surgery in 2006 with ongoing back pain. L3-L5 fused, S1 joints arthritic and degenerative. ROM in back decreased by 40%. Has spinal cord stimulator. Hip pain with prolonged walking.    Cardiac Risk Stratification Moderate           6 Minute Walk:  6 Minute Walk     Row Name 11/07/20 1054         6 Minute Walk   Phase Initial     Distance 1064 feet     Walk Time 6 minutes     # of Rest Breaks 0     MPH 2.01     METS 2.24     RPE 13     Perceived Dyspnea  1     VO2 Peak 7.84     Symptoms Yes (comment)     Comments Patient reported 6/10 left hip pain and mild SOB, 1/4 on dyspnea  scale.     Resting HR 79 bpm     Resting BP 102/60     Resting Oxygen Saturation  98 %     Exercise Oxygen Saturation  during 6 min walk 98 %     Max Ex. HR 86 bpm     Max Ex. BP 134/72     2 Minute Post BP 118/70            Oxygen Initial Assessment:   Oxygen Re-Evaluation:   Oxygen Discharge (Final Oxygen Re-Evaluation):   Initial Exercise Prescription:  Initial Exercise Prescription - 11/07/20 1100      Date of Initial Exercise RX and Referring Provider   Date 11/07/20    Referring Provider Freada Bergeron, MD    Expected Discharge Date 01/05/21      T5 Nustep   Level 2    SPM 85    Minutes 25    METs 1.8      Prescription Details   Frequency (times per week) 3    Duration Progress to 30 minutes of continuous aerobic without signs/symptoms of physical distress      Intensity   THRR 40-80% of Max Heartrate 62-124    Ratings of Perceived Exertion 11-13    Perceived Dyspnea 0-4      Progression   Progression Continue to progress workloads to maintain intensity without signs/symptoms of physical distress.      Resistance Training   Training Prescription Yes    Weight 4 lbs    Reps 10-15           Perform Capillary Blood Glucose checks as needed.  Exercise Prescription Changes:   Exercise Comments:   Exercise Goals and Review:   Exercise Goals    Row Name 11/07/20 1045             Exercise Goals   Increase Physical Activity Yes       Intervention Provide advice, education, support and counseling about physical activity/exercise needs.;Develop an individualized exercise prescription for aerobic and resistive  training based on initial evaluation findings, risk stratification, comorbidities and participant's personal goals.       Expected Outcomes Short Term: Attend rehab on a regular basis to increase amount of physical activity.;Long Term: Exercising regularly at least 3-5 days a week.;Long Term: Add in home exercise to make exercise part of routine and to increase amount of physical activity.       Increase Strength and Stamina Yes       Intervention Provide advice, education, support and counseling about physical activity/exercise needs.;Develop an individualized exercise prescription for aerobic and resistive training based on initial evaluation findings, risk stratification, comorbidities and participant's personal goals.       Expected Outcomes Short Term: Increase workloads from initial exercise prescription for resistance, speed, and METs.;Short Term: Perform resistance training exercises routinely during rehab and add in resistance training at home;Long Term: Improve cardiorespiratory fitness, muscular endurance and strength as measured by increased METs and functional capacity (6MWT)       Able to understand and use rate of perceived exertion (RPE) scale Yes       Intervention Provide education and explanation on how to use RPE scale       Expected Outcomes Short Term: Able to use RPE daily in rehab to express subjective intensity level;Long Term:  Able to use RPE to guide intensity level when exercising independently       Knowledge and understanding of Target Heart Rate Range (  THRR) Yes       Intervention Provide education and explanation of THRR including how the numbers were predicted and where they are located for reference       Expected Outcomes Short Term: Able to state/look up THRR;Long Term: Able to use THRR to govern intensity when exercising independently;Short Term: Able to use daily as guideline for intensity in rehab       Able to check pulse independently Yes       Intervention  Provide education and demonstration on how to check pulse in carotid and radial arteries.;Review the importance of being able to check your own pulse for safety during independent exercise       Expected Outcomes Short Term: Able to explain why pulse checking is important during independent exercise;Long Term: Able to check pulse independently and accurately       Understanding of Exercise Prescription Yes       Intervention Provide education, explanation, and written materials on patient's individual exercise prescription       Expected Outcomes Short Term: Able to explain program exercise prescription;Long Term: Able to explain home exercise prescription to exercise independently              Exercise Goals Re-Evaluation :    Discharge Exercise Prescription (Final Exercise Prescription Changes):   Nutrition:  Target Goals: Understanding of nutrition guidelines, daily intake of sodium <1567m, cholesterol <2010m calories 30% from fat and 7% or less from saturated fats, daily to have 5 or more servings of fruits and vegetables.  Biometrics:  Pre Biometrics - 11/07/20 1050      Pre Biometrics   Waist Circumference 46.5 inches    Hip Circumference 43.25 inches    Waist to Hip Ratio 1.08 %    Triceps Skinfold 10 mm    % Body Fat 31.2 %    Grip Strength 44.5 kg    Flexibility --   N/A due to back problems.   Single Leg Stand 5.93 seconds            Nutrition Therapy Plan and Nutrition Goals:   Nutrition Assessments:  MEDIFICTS Score Key:  ?70 Need to make dietary changes   40-70 Heart Healthy Diet  ? 40 Therapeutic Level Cholesterol Diet   Picture Your Plate Scores:  <4<24nhealthy dietary pattern with much room for improvement.  41-50 Dietary pattern unlikely to meet recommendations for good health and room for improvement.  51-60 More healthful dietary pattern, with some room for improvement.   >60 Healthy dietary pattern, although there may be some specific  behaviors that could be improved.    Nutrition Goals Re-Evaluation:   Nutrition Goals Discharge (Final Nutrition Goals Re-Evaluation):   Psychosocial: Target Goals: Acknowledge presence or absence of significant depression and/or stress, maximize coping skills, provide positive support system. Participant is able to verbalize types and ability to use techniques and skills needed for reducing stress and depression.  Initial Review & Psychosocial Screening:  Initial Psych Review & Screening - 11/07/20 1201      Initial Review   Current issues with History of Depression;Current Anxiety/Panic;Current Stress Concerns    Source of Stress Concerns Chronic Illness;Unable to perform yard/household activities    Comments Brandon [name] been disabled due to breaking his back in 2006 continues to have problems with back pain. History of depression currently controlled on two antidepressants.      Family Dynamics   Good Support System? Yes   Brandon [name] his wife and 4 children  for suport     Barriers   Psychosocial barriers to participate in program The patient should benefit from training in stress management and relaxation.      Screening Interventions   Interventions Encouraged to exercise;To provide support and resources with identified psychosocial needs;Provide feedback about the scores to participant    Expected Outcomes Long Term Goal: Stressors or current issues are controlled or eliminated.;Short Term goal: Identification and review with participant of any Quality of Life or Depression concerns found by scoring the questionnaire.           Quality of Life Scores:  Quality of Life - 11/07/20 1141      Quality of Life   Select Quality of Life      Quality of Life Scores   Health/Function Pre 17.9 %    Socioeconomic Pre 20.33 %    Psych/Spiritual Pre 21.93 %    Family Pre 25 %    GLOBAL Pre 20.27 %          Scores of 19 and below usually indicate a poorer quality of life in these  areas.  A difference of  2-3 points is a clinically meaningful difference.  A difference of 2-3 points in the total score of the Quality of Life Index has been associated with significant improvement in overall quality of life, self-image, physical symptoms, and general health in studies assessing change in quality of life.  PHQ-9: Recent Review Flowsheet Data    Depression screen [facility] 2/9 11/07/2020 06/02/2020   Decreased Interest 0 1   Down, Depressed, Hopeless 0 1   PHQ - 2 Score 0 2   Altered sleeping - 2   Tired, decreased energy - 2   Change in appetite - 2   Feeling bad or failure about yourself  - 0   Trouble concentrating - 0   Moving slowly or fidgety/restless - 0   Suicidal thoughts - 0   PHQ-9 Score - 8   Difficult doing work/chores - Somewhat difficult     Interpretation of Total Score  Total Score Depression Severity:  1-4 = Minimal depression, 5-9 = Mild depression, 10-14 = Moderate depression, 15-19 = Moderately severe depression, 20-27 = Severe depression   Psychosocial Evaluation and Intervention:   Psychosocial Re-Evaluation:   Psychosocial Discharge (Final Psychosocial Re-Evaluation):   Vocational Rehabilitation: Provide vocational rehab assistance to qualifying candidates.   Vocational Rehab Evaluation & Intervention:  Vocational Rehab - 11/07/20 1205      Initial Vocational Rehab Evaluation & Intervention   Assessment shows need for Vocational Rehabilitation No   Brandon [name] is disabled and does not need vocational rehab at this time          Education: Education Goals: Education classes will be provided on a weekly basis, covering required topics. Participant will state understanding/return demonstration of topics presented.  Learning Barriers/Preferences:  Learning Barriers/Preferences - 11/07/20 1204      Learning Barriers/Preferences   Learning Barriers Exercise Concerns   Dizziness when standing too quick   Learning Preferences Skilled  Demonstration           Education Topics: Hypertension, Hypertension Reduction -Define heart disease and high blood pressure. Discus how high blood pressure affects the body and ways to reduce high blood pressure.   Exercise and Your Heart -Discuss why it is important to exercise, the FITT principles of exercise, normal and abnormal responses to exercise, and how to exercise safely.   Angina -Discuss definition of angina, causes of angina,  treatment of angina, and how to decrease risk of having angina.   Cardiac Medications -Review what the following cardiac medications are used for, how they affect the body, and side effects that may occur when taking the medications.  Medications include Aspirin, Beta blockers, calcium channel blockers, ACE Inhibitors, angiotensin receptor blockers, diuretics, digoxin, and antihyperlipidemics.   Congestive Heart Failure -Discuss the definition of CHF, how to live with CHF, the signs and symptoms of CHF, and how keep track of weight and sodium intake.   Heart Disease and Intimacy -Discus the effect sexual activity has on the heart, how changes occur during intimacy as we age, and safety during sexual activity.   Smoking Cessation / COPD -Discuss different methods to quit smoking, the health benefits of quitting smoking, and the definition of COPD.   Nutrition I: Fats -Discuss the types of cholesterol, what cholesterol does to the heart, and how cholesterol levels can be controlled.   Nutrition II: Labels -Discuss the different components of food labels and how to read food label   Heart Parts/Heart Disease and PAD -Discuss the anatomy of the heart, the pathway of blood circulation through the heart, and these are affected by heart disease.   Stress I: Signs and Symptoms -Discuss the causes of stress, how stress may lead to anxiety and depression, and ways to limit stress.   Stress II: Relaxation -Discuss different types of  relaxation techniques to limit stress.   Warning Signs of Stroke / TIA -Discuss definition of a stroke, what the signs and symptoms are of a stroke, and how to identify when someone is having stroke.   Knowledge Questionnaire Score:  Knowledge Questionnaire Score - 11/07/20 1141      Knowledge Questionnaire Score   Pre Score 21/24           Core Components/Risk Factors/Patient Goals at Admission:  Personal Goals and Risk Factors at Admission - 11/07/20 1041      Core Components/Risk Factors/Patient Goals on Admission    Weight Management Yes;Obesity;Weight Loss    Intervention Weight Management/Obesity: Establish reasonable short term and long term weight goals.;Obesity: Provide education and appropriate resources to help participant work on and attain dietary goals.    Admit Weight 241 lb 13.5 oz (109.7 kg)    Goal Weight: Short Term 235 lb (106.6 kg)    Goal Weight: Long Term 199 lb (90.3 kg)    Expected Outcomes Short Term: Continue to assess and modify interventions until short term weight is achieved;Long Term: Adherence to nutrition and physical activity/exercise program aimed toward attainment of established weight goal;Weight Loss: Understanding of general recommendations for a balanced deficit meal plan, which promotes 1-2 lb weight loss per week and includes a negative energy balance of [phone] kcal/d    Diabetes Yes    Intervention Provide education about signs/symptoms and action to take for hypo/hyperglycemia.;Provide education about proper nutrition, including hydration, and aerobic/resistive exercise prescription along with prescribed medications to achieve blood glucose in normal ranges: Fasting glucose 65-99 mg/dL    Expected Outcomes Short Term: Participant verbalizes understanding of the signs/symptoms and immediate care of hyper/hypoglycemia, proper foot care and importance of medication, aerobic/resistive exercise and nutrition plan for blood glucose control.;Long  Term: Attainment of HbA1C < 7%.    Hypertension Yes    Intervention Provide education on lifestyle modifcations including regular physical activity/exercise, weight management, moderate sodium restriction and increased consumption of fresh fruit, vegetables, and low fat dairy, alcohol moderation, and smoking cessation.;Monitor prescription use compliance.  Expected Outcomes Short Term: Continued assessment and intervention until BP is < 140/32m HG in hypertensive participants. < 130/844mHG in hypertensive participants with diabetes, heart failure or chronic kidney disease.;Long Term: Maintenance of blood pressure at goal levels.    Lipids Yes    Intervention Provide education and support for participant on nutrition & aerobic/resistive exercise along with prescribed medications to achieve LDL <7058mHDL >56m58m  Expected Outcomes Short Term: Participant states understanding of desired cholesterol values and is compliant with medications prescribed. Participant is following exercise prescription and nutrition guidelines.;Long Term: Cholesterol controlled with medications as prescribed, with individualized exercise RX and with personalized nutrition plan. Value goals: LDL < 70mg68mL > 40 mg.    Stress Yes    Intervention Offer individual and/or small group education and counseling on adjustment to heart disease, stress management and health-related lifestyle change. Teach and support self-help strategies.;Refer participants experiencing significant psychosocial distress to appropriate mental health specialists for further evaluation and treatment. When possible, include family members and significant others in education/counseling sessions.    Expected Outcomes Short Term: Participant demonstrates changes in health-related behavior, relaxation and other stress management skills, ability to obtain effective social support, and compliance with psychotropic medications if prescribed.;Long Term: Emotional  wellbeing is indicated by absence of clinically significant psychosocial distress or social isolation.           Core Components/Risk Factors/Patient Goals Review:    Core Components/Risk Factors/Patient Goals at Discharge (Final Review):    ITP Comments:  ITP Comments    Row Name 11/07/20 1051           ITP Comments Dr TraciFransico HimMedical Director              Comments: TerryCoralyn Marknded orientation on 11/07/2020 to review rules and guidelines for program.  Completed 6 minute walk test, Intitial ITP, and exercise prescription.  VSS. Telemetry-Sinus Rhythm. Brandon [name] having 6/10 left hip pain and mild shortness of breath at the end of the walk test . Safety measures and social distancing in place per CDC guidelines.MariaBarnet PallBSN 11/07/2020 12:13 PM

## 2020-11-09 ENCOUNTER — Other Ambulatory Visit: Payer: Self-pay | Admitting: Family Medicine

## 2020-11-10 ENCOUNTER — Telehealth: Payer: Self-pay

## 2020-11-10 ENCOUNTER — Other Ambulatory Visit: Payer: Self-pay | Admitting: Family Medicine

## 2020-11-10 NOTE — Telephone Encounter (Signed)
[facility] @ [facility] called to check on the status of the patient. Since the Peer-to-peer did not work to get the patient authorized, what at the next steps. Victorino Dike aware that you are out of the office on Friday and Monday.

## 2020-11-13 ENCOUNTER — Encounter (HOSPITAL_COMMUNITY)
Admission: RE | Admit: 2020-11-13 | Discharge: 2020-11-13 | Disposition: A | Discharge status: Home / Self Care | Payer: No Typology Code available for payment source | Source: Ambulatory Visit | Attending: Cardiology | Admitting: Cardiology

## 2020-11-13 ENCOUNTER — Other Ambulatory Visit: Payer: Self-pay

## 2020-11-13 DIAGNOSIS — Z955 Presence of coronary angioplasty implant and graft: Secondary | ICD-10-CM

## 2020-11-13 LAB — GLUCOSE, CAPILLARY
Glucose-Capillary: 165 mg/dL — ABNORMAL HIGH (ref 70–99)
Glucose-Capillary: 140 mg/dL — ABNORMAL HIGH (ref 70–99)

## 2020-11-13 MED FILL — TESTOSTERONE CYP 200 MG/ML: 200 | 28 days supply | Qty: 4 | Fill #1

## 2020-11-13 NOTE — Progress Notes (Signed)
Daily Session Note  Patient Details  Name: Brandon [name] MRN: [id number] Date of Birth: 10-13-1955 Referring Provider:   Flowsheet Row CARDIAC REHAB PHASE II ORIENTATION from 11/07/2020 in [facility] CARDIAC [facility]  Referring Provider Meriam Sprague, MD      Encounter Date: 11/13/2020  Check In:  Session Check In - 11/13/20 1129      Check-In   Supervising physician immediately available to respond to emergencies [facility] immediately available    Physician(s) Dr. Renford Dills    Location [facility]    Staff Present Cristy Hilts, MS, ACSM CEP, Exercise Physiologist;Kevron Patella, RN, BSN;Other;David Makemson, MS, EP-C, CCRP;Carlette Carlton, RN, BSN    Virtual Visit No    Medication changes reported     No    Fall or balance concerns reported    No    Tobacco Cessation No Change    Warm-up and Cool-down Performed on first and last piece of equipment    Resistance Training Performed Yes    VAD Patient? No    PAD/SET Patient? No      Pain Assessment   Currently in Pain? No/denies    Pain Score 5     Pain Location Hip    Pain Orientation Left    Pain Type Chronic pain    Multiple Pain Sites No           Capillary Blood Glucose: Results for orders placed or performed during the hospital encounter of 11/13/20 (from the past 24 hour(s))  Glucose, capillary     Status: Abnormal   Collection Time: 11/13/20 10:41 AM  Result Value Ref Range   Glucose-Capillary 165 (H) 70 - 99 mg/dL  Glucose, capillary     Status: Abnormal   Collection Time: 11/13/20 11:46 AM  Result Value Ref Range   Glucose-Capillary 140 (H) 70 - 99 mg/dL     Exercise Prescription Changes - 11/13/20 1048      Response to Exercise   Blood Pressure (Admit) 120/60    Blood Pressure (Exercise) 122/62    Blood Pressure (Exit) 118/68    Heart Rate (Admit) 89 bpm    Heart Rate (Exercise) 89 bpm    Heart Rate (Exit) 81 bpm    Rating of Perceived Exertion  (Exercise) 11    Symptoms Patient c/o SI joint pain, which is chronic for him.    Comments Patient completed 1st session of exercise fair with some joint pain.    Duration Progress to 30 minutes of  aerobic without signs/symptoms of physical distress    Intensity THRR unchanged      Progression   Progression Continue to progress workloads to maintain intensity without signs/symptoms of physical distress.    Average METs 1.5      Resistance Training   Training Prescription Yes    Weight 4 lbs    Reps 10-15    Time 10 Minutes      Interval Training   Interval Training No      NuStep   Level 2    SPM 85    Minutes 25    METs 1.5      T5 Nustep   Level --    SPM --    Minutes --    METs --           Social History   Tobacco Use  Smoking Status Former Smoker  . Quit date: 2007  . Years since quitting: 15.2  Smokeless Tobacco  Never Used    Goals Met:  Exercise tolerated well No report of cardiac concerns or symptoms Strength training completed today  Goals Unmet:  Not Applicable  Comments: Barth Kirks started cardiac rehab today.  Pt tolerated light exercise without difficulty. VSS, telemetry-Sinus Rhythm, asymptomatic.  Medication list reconciled. Pt denies barriers to medicaiton compliance.  PSYCHOSOCIAL ASSESSMENT:  PHQ-0. Pt exhibits positive coping skills, hopeful outlook with supportive family. No psychosocial needs identified at this time, no psychosocial interventions necessary.    Pt enjoys watching TV.   Pt oriented to exercise equipment and routine.    Understanding verbalized.[name], RN,BSN 11/14/2020 8:11 AM   Dr. Armanda Magic is Medical Director for Cardiac Rehab at [facility].

## 2020-11-13 NOTE — Telephone Encounter (Signed)
Without insurance coverage he will just have to pay out of pocket for the injections.

## 2020-11-14 ENCOUNTER — Ambulatory Visit (INDEPENDENT_AMBULATORY_CARE_PROVIDER_SITE_OTHER): Payer: No Typology Code available for payment source | Admitting: Sports Medicine

## 2020-11-14 DIAGNOSIS — M961 Postlaminectomy syndrome, not elsewhere classified: Secondary | ICD-10-CM | POA: Diagnosis not present

## 2020-11-14 DIAGNOSIS — M47816 Spondylosis without myelopathy or radiculopathy, lumbar region: Secondary | ICD-10-CM | POA: Diagnosis not present

## 2020-11-14 DIAGNOSIS — I25110 Atherosclerotic heart disease of native coronary artery with unstable angina pectoris: Secondary | ICD-10-CM

## 2020-11-14 NOTE — Progress Notes (Signed)
    Procedures performed today:    None.  Independent interpretation of notes and tests performed by another provider:   None.  Brief History, Exam, Impression, and Recommendations:    Facet arthropathy, lumbar Brandon [name] is a pleasant 65 year old male, he has had back surgery with persistent pain, he has had multiple interventions including spinal cord stimulator placement, left and right sacroiliac joint pain, he had bilateral injections performed by me at the end of last year with 24 hours of relief, he did have a new MRI ordered that showed severe bilateral L5-S1 facet arthritis. He does have minimal tenderness to along the lumbar spine. He has had greater than 3 months of conservative treatment with exercise programs, medication including NSAIDs, narcotics, muscle relaxers. We tried to get facet joint injections approved but his insurance company denied him. I am simply going to try to get bilateral L5-S1 medial branch blocks, followed by RFA.    ___________________________________________ Brandon [name]. Brandon [name], M.D., ABFM., CAQSM. Primary Care and Sports Medicine [facility]  Adjunct Instructor of Family Medicine  [employer]

## 2020-11-14 NOTE — Assessment & Plan Note (Signed)
Brandon [name] is a pleasant 65 year old male, he has had back surgery with persistent pain, he has had multiple interventions including spinal cord stimulator placement, left and right sacroiliac joint pain, he had bilateral injections performed by me at the end of last year with 24 hours of relief, he did have a new MRI ordered that showed severe bilateral L5-S1 facet arthritis. He does have minimal tenderness to along the lumbar spine. He has had greater than 3 months of conservative treatment with exercise programs, medication including NSAIDs, narcotics, muscle relaxers. We tried to get facet joint injections approved but his insurance company denied him. I am simply going to try to get bilateral L5-S1 medial branch blocks, followed by RFA.

## 2020-11-14 NOTE — Telephone Encounter (Signed)
Patient has an appt with Dr. Karie Schwalbe today. Will let you know about any updates as soon as they are available.   Left Victorino Dike a message with the above information.

## 2020-11-15 ENCOUNTER — Other Ambulatory Visit: Payer: Self-pay

## 2020-11-15 ENCOUNTER — Ambulatory Visit (INDEPENDENT_AMBULATORY_CARE_PROVIDER_SITE_OTHER): Payer: No Typology Code available for payment source | Admitting: Endocrinology

## 2020-11-15 ENCOUNTER — Encounter: Payer: Self-pay | Admitting: Endocrinology

## 2020-11-15 ENCOUNTER — Encounter (HOSPITAL_COMMUNITY)
Admission: RE | Admit: 2020-11-15 | Discharge: 2020-11-15 | Disposition: A | Discharge status: Home / Self Care | Payer: No Typology Code available for payment source | Source: Ambulatory Visit | Attending: Cardiology | Admitting: Cardiology

## 2020-11-15 VITALS — BP 148/70 | HR 86 | Ht 69.0 in | Wt 243.4 lb

## 2020-11-15 DIAGNOSIS — Z955 Presence of coronary angioplasty implant and graft: Secondary | ICD-10-CM

## 2020-11-15 DIAGNOSIS — E1169 Type 2 diabetes mellitus with other specified complication: Secondary | ICD-10-CM

## 2020-11-15 DIAGNOSIS — I25110 Atherosclerotic heart disease of native coronary artery with unstable angina pectoris: Secondary | ICD-10-CM

## 2020-11-15 LAB — GLUCOSE, CAPILLARY
Glucose-Capillary: 255 mg/dL — ABNORMAL HIGH (ref 70–99)
Glucose-Capillary: 171 mg/dL — ABNORMAL HIGH (ref 70–99)

## 2020-11-15 LAB — POCT GLYCOSYLATED HEMOGLOBIN (HGB A1C): Hemoglobin A1C: 6.5 % — AB (ref 4.0–5.6)

## 2020-11-15 MED ORDER — INSULIN LISPRO (1 UNIT DIAL) 100 UNIT/ML (KWIKPEN): 10.0000 [IU] | PEN_INJECTOR | Freq: Three times a day (TID) | SUBCUTANEOUS | 11 refills | Status: DC

## 2020-11-15 MED ORDER — TRESIBA FLEXTOUCH 100 UNIT/ML ~~LOC~~ SOPN: 50.0000 [IU] | PEN_INJECTOR | Freq: Every day | SUBCUTANEOUS | 3 refills | Status: DC

## 2020-11-15 NOTE — Progress Notes (Signed)
Subjective:    Patient ID: Brandon [name], male    DOB: 17-Apr-1956, 65 y.o.   MRN: [id number]  HPI pt is referred by Dr Madilyn Fireman, for diabetes.  Pt states DM was dx'ed in 6147; it is complicated by CAD; he has been on insulin since 2012; pt says his diet and exercise are not good; he has never had pancreatitis, pancreatic surgery, severe hypoglycemia or DKA.  He says med compliance is much better since 2021.  Meter is downloaded today, and the printout is scanned into the record. cbg varies from 75-292.  There is no trend throughout the day.  He has gained weight x a few months.  He takes [place] 60/d, Humalog (1 unit/5 grams CHO, plus ss--averages 35 total units/d), and 2 oral meds.  Pt says ins declined pump and continuous glucose monitor.   Past Medical History:  Diagnosis Date  . Anxiety   . Basal cell carcinoma (BCC) in situ of skin   . Colon polyps   . Coronary artery disease   . Depression   . Diabetes mellitus without complication ([place])   . Hypertension   . SBO (small bowel obstruction) (HCC)     Past Surgical History:  Procedure Laterality Date  . BACK SURGERY    . CARDIAC CATHETERIZATION    . CATARACT EXTRACTION W/ INTRAOCULAR LENS  IMPLANT, BILATERAL    . CHOLECYSTECTOMY    . CHOLECYSTECTOMY, LAPAROSCOPIC    . CORONARY STENT INTERVENTION N/A 08/15/2020   Procedure: CORONARY STENT INTERVENTION;  Surgeon: Jettie Booze, MD;  Location: [facility];  Service: Cardiovascular;  Laterality: N/A;  . EYE SURGERY    . INTRAVASCULAR ULTRASOUND/IVUS N/A 08/15/2020   Procedure: Intravascular Ultrasound/IVUS;  Surgeon: Jettie Booze, MD;  Location: [place];  Service: Cardiovascular;  Laterality: N/A;  . LEFT HEART CATH AND CORONARY ANGIOGRAPHY N/A 08/15/2020   Procedure: LEFT HEART CATH AND CORONARY ANGIOGRAPHY;  Surgeon: Jettie Booze, MD;  Location: [facility];  Service: Cardiovascular;  Laterality: N/A;  . LUMBAR FUSION     L3-5 with  rods  . XI ROBOTIC ASSISTED INGUINAL HERNIA REPAIR WITH MESH     x 3 surgeries    Social History   Socioeconomic History  . Marital status: Married    Spouse name: Not on file  . Number of children: 4  . Years of education: 62  . Highest education level: Not on file  Occupational History  . Occupation: disabled  Tobacco Use  . Smoking status: Former Smoker    Quit date: 2007    Years since quitting: 15.2  . Smokeless tobacco: Never Used  Vaping Use  . Vaping Use: Not on file  Substance and Sexual Activity  . Alcohol use: Not Currently  . Drug use: Not Currently  . Sexual activity: Yes  Other Topics Concern  . Not on file  Social History Narrative  . Not on file   Social Determinants of Health   Financial Resource Strain: Not on file  Food Insecurity: Not on file  Transportation Needs: Not on file  Physical Activity: Not on file  Stress: Not on file  Social Connections: Not on file  Intimate Partner Violence: Not on file    Current Outpatient Medications on File Prior to Visit  Medication Sig Dispense Refill  . amLODipine (NORVASC) 10 MG tablet Take 10 mg by mouth every morning.    . ARIPiprazole (ABILIFY) 10 MG tablet Take 10 mg by  mouth every morning.    Marland Kitchen aspirin 81 MG EC tablet Take 81 mg by mouth every morning.    Marland Kitchen atorvastatin (LIPITOR) 80 MG tablet Take 1 tablet (80 mg total) by mouth daily. 90 tablet 3  . Blood Glucose Monitoring Suppl (FREESTYLE LITE) w/Device KIT 1 each by Does not apply route 3 (three) times daily as needed. 1 kit prn  . Blood Pressure Monitoring (OMRON 3 SERIES BP MONITOR) DEVI     . buPROPion (WELLBUTRIN XL) 150 MG 24 hr tablet TAKE 1 TABLET BY MOUTH ONCE DAILY 90 tablet 0  . buPROPion (WELLBUTRIN XL) 300 MG 24 hr tablet TAKE 1 TABLET BY MOUTH ONCE DAILY 90 tablet 0  . clopidogrel (PLAVIX) 75 MG tablet Take 1 tablet (75 mg total) by mouth daily with breakfast. 90 tablet 3  . glucose blood (FREESTYLE LITE) test strip Dx DM E11.69 Check  blood glucose up to 5 times daily. 300 each 12  . JARDIANCE 25 MG TABS tablet TAKE 1 TABLET BY MOUTH ONCE DAILY 90 tablet 0  . Lancets (FREESTYLE) lancets Dx DM E11.69 Check blood glucose up to 5 times daily. 300 each 12  . metoCLOPramide (REGLAN) 10 MG tablet Take 10 mg by mouth daily as needed for nausea or vomiting (upset stomach). PRN    . metoprolol tartrate (LOPRESSOR) 25 MG tablet Take 1 tablet (25 mg total) by mouth 2 (two) times daily. 180 tablet 3  . ondansetron (ZOFRAN ODT) 4 MG disintegrating tablet Take 1 tablet (4 mg total) by mouth every 8 (eight) hours as needed for nausea or vomiting. 20 tablet 0  . pantoprazole (PROTONIX) 40 MG tablet TAKE 1 TABLET BY MOUTH ONCE DAILY 90 tablet 0  . tamsulosin (FLOMAX) 0.4 MG CAPS capsule TAKE 1 CAPSULE BY MOUTH ONCE DAILY. 90 capsule 0  . Testosterone Cypionate 200 MG/ML SOLN Inject 200 mg into the muscle every Sunday. 10 mL 1  . tiZANidine (ZANAFLEX) 4 MG tablet Take 2 tablets (8 mg total) by mouth every 8 (eight) hours as needed for muscle spasms.    . traMADol (ULTRAM) 50 MG tablet Take 2 tablets (100 mg total) by mouth 3 (three) times daily as needed.    . traZODone (DESYREL) 50 MG tablet Take 1-2 tablets (50-100 mg total) by mouth at bedtime as needed for sleep. 60 tablet 3  . triazolam (HALCION) 0.25 MG tablet 1-2 tabs PO 2 hours before procedure or imaging.  Do not drive with this medication. 2 tablet 0  . nitroGLYCERIN (NITROSTAT) 0.4 MG SL tablet Place 1 tablet (0.4 mg total) under the tongue every 5 (five) minutes as needed for chest pain. 60 tablet 0   No current facility-administered medications on file prior to visit.    Allergies  Allergen Reactions  . Kiwi Extract Swelling    Mouth swelling.   . Other Swelling    EGGPLANT. Mouth swelling.  Marland Kitchen Penicillins Rash  . Ancef [Cefazolin] Rash  . Latex Rash  . Levaquin [Levofloxacin] Rash    Family History  Problem Relation Age of Onset  . COPD Mother   . Anxiety disorder  Mother   . Depression Mother   . Cancer Father   . Anxiety disorder Father   . Depression Father   . Other Father   . Diabetes Neg Hx     BP (!) 148/70 (BP Location: Right Arm, Patient Position: Sitting, Cuff Size: Large)   Pulse 86   Ht '5\' 9"'  (1.753 m)   Wt  243 lb 6.4 oz (110.4 kg)   SpO2 94%   BMI 35.94 kg/m     Review of Systems denies blurry vision and n/v.  depression is well-controlled. He has doe.       Objective:   Physical Exam VITAL SIGNS:  See vs page GENERAL: no distress Pulses: dorsalis pedis intact bilat.   MSK: no deformity of the feet CV: 2+ bilat leg edema Skin:  no ulcer on the feet.  normal color and temp on the feet. Neuro: sensation is intact to touch on the feet  Lab Results  Component Value Date   CREATININE 0.72 08/16/2020   BUN 9 08/16/2020   NA 136 08/16/2020   K 3.7 08/16/2020   CL 104 08/16/2020   CO2 23 08/16/2020   Lab Results  Component Value Date   HGBA1C 6.5 (A) 11/15/2020   I have reviewed outside records, and summarized: Pt was noted to have elevated A1c, and referred here.  Main problem addressed was back pain.        Assessment & Plan:  Insulin-requiring type 2 DM, with CAD: The pattern of his cbg's indicates he needs some adjustment in his therapy Edema, due to pioglitazone  Patient Instructions  good diet and exercise significantly improve the control of your diabetes.  please let me know if you wish to be referred to a dietician.  high blood sugar is very risky to your health.  you should see an eye doctor and dentist every year.  It is very important to get all recommended vaccinations.  Controlling your blood pressure and cholesterol drastically reduces the damage diabetes does to your body.  Those who smoke should quit.  Please discuss these with your doctor.  check your blood sugar twice a day.  vary the time of day when you check, between before the 3 meals, and at bedtime.  also check if you have symptoms of your  blood sugar being too high or too low.  please keep a record of the readings and bring it to your next appointment here (or you can bring the meter itself).  You can write it on any piece of paper.  please call us sooner if your blood sugar goes below 70, or if most of your readings are over 200.  We will need to take this complex situation in stages For now, please: Stop taking the pioglitazone (as it can cause your legs to swell), and: Reduce the Tresiba to 50 units per day, and: Increase the Humalog to 1 unit/4 grams, and: Please continue the same Jardiance. Please come back for a follow-up appointment in 2 months.

## 2020-11-15 NOTE — Patient Instructions (Addendum)
good diet and exercise significantly improve the control of your diabetes.  please let me know if you wish to be referred to a dietician.  high blood sugar is very risky to your health.  you should see an eye doctor and dentist every year.  It is very important to get all recommended vaccinations.  Controlling your blood pressure and cholesterol drastically reduces the damage diabetes does to your body.  Those who smoke should quit.  Please discuss these with your doctor.  check your blood sugar twice a day.  vary the time of day when you check, between before the 3 meals, and at bedtime.  also check if you have symptoms of your blood sugar being too high or too low.  please keep a record of the readings and bring it to your next appointment here (or you can bring the meter itself).  You can write it on any piece of paper.  please call us sooner if your blood sugar goes below 70, or if most of your readings are over 200.  We will need to take this complex situation in stages For now, please: Stop taking the pioglitazone (as it can cause your legs to swell), and: Reduce the Tresiba to 50 units per day, and: Increase the Humalog to 1 unit/4 grams, and: Please continue the same Jardiance. Please come back for a follow-up appointment in 2 months.

## 2020-11-17 ENCOUNTER — Encounter (HOSPITAL_COMMUNITY)
Admission: RE | Admit: 2020-11-17 | Discharge: 2020-11-17 | Disposition: A | Discharge status: Home / Self Care | Payer: No Typology Code available for payment source | Source: Ambulatory Visit | Attending: Cardiology | Admitting: Cardiology

## 2020-11-17 ENCOUNTER — Other Ambulatory Visit: Payer: Self-pay

## 2020-11-17 DIAGNOSIS — Z955 Presence of coronary angioplasty implant and graft: Secondary | ICD-10-CM

## 2020-11-17 NOTE — Progress Notes (Signed)
QUALITY OF LIFE SCORE REVIEW  Pt completed Quality of Life survey as a participant in Cardiac Rehab. Scores 21.0 or below are considered low. Pt score very low in several areas Overall 20.27, Health and Function 17.9, socioeconomic 20.33, physiological and spiritual 21.93, family 25.00. Patient quality of life slightly altered by physical constraints which limits ability to perform as prior to recent cardiac illness. Brandon [name] denies being depressed but states being dissatisfied with his health due to his recent stenting and diabetes. Brandon [name] mentioned that he is in the process of moving to another apartment. Offered emotional support and reassurance.  Will continue to monitor and intervene as necessary. Will forward to his primary care physician Dr Ashley Royalty.[name], RN,BSN 11/17/2020 12:21 PM

## 2020-11-17 NOTE — Progress Notes (Signed)
Post exercise CBG 83. Brandon [name] reported feeling a little lightheaded after getting off the nustep. BP 122/84. Patient given ginger ale and graham crackers. Recheck CBG 96 with own meter. Brandon [name] reported feeling better upon exit from cardiac rehab. He also used one of his glucose tablets.Will continue to monitor the patient throughout  the program.Brandon [name] Harlon Flor, RN,BSN 11/17/2020 12:27 PM

## 2020-11-20 ENCOUNTER — Other Ambulatory Visit: Payer: Self-pay

## 2020-11-20 ENCOUNTER — Encounter (HOSPITAL_COMMUNITY)
Admission: RE | Admit: 2020-11-20 | Discharge: 2020-11-20 | Disposition: A | Discharge status: Home / Self Care | Payer: No Typology Code available for payment source | Source: Ambulatory Visit | Attending: Cardiology | Admitting: Cardiology

## 2020-11-20 ENCOUNTER — Telehealth: Payer: Self-pay

## 2020-11-20 ENCOUNTER — Other Ambulatory Visit: Payer: Self-pay | Admitting: Sports Medicine

## 2020-11-20 DIAGNOSIS — Z955 Presence of coronary angioplasty implant and graft: Secondary | ICD-10-CM

## 2020-11-20 DIAGNOSIS — M961 Postlaminectomy syndrome, not elsewhere classified: Secondary | ICD-10-CM

## 2020-11-20 MED ORDER — TRAMADOL HCL 50 MG PO TABS: 100.0000 mg | ORAL_TABLET | Freq: Three times a day (TID) | ORAL | 0 refills | Status: DC | PRN

## 2020-11-20 MED FILL — tiZANidine HCL 4 MG TABS: 4 | 30 days supply | Qty: 90 | Fill #3

## 2020-11-20 MED FILL — traMADol HCL 50 MG TABS: 50 | 30 days supply | Qty: 180 | Fill #0

## 2020-11-20 NOTE — Progress Notes (Signed)
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  amLODipine (NORVASC) 10 MG tablet, Take 10 mg by mouth every morning., Disp: , Rfl:  .  ARIPiprazole (ABILIFY) 10 MG tablet, Take 10 mg by mouth every morning., Disp: , Rfl:  .  aspirin 81 MG EC tablet, Take 81 mg by mouth every morning., Disp: , Rfl:  .  atorvastatin (LIPITOR) 80 MG tablet, Take 1 tablet (80 mg total) by mouth daily., Disp: 90 tablet, Rfl: 3 .  Blood Glucose Monitoring Suppl (FREESTYLE LITE) w/Device KIT, 1 each by Does not apply route 3 (three) times daily as needed., Disp: 1 kit, Rfl: prn .  Blood Pressure Monitoring (OMRON 3 SERIES BP MONITOR) DEVI, , Disp: , Rfl:  .  buPROPion (WELLBUTRIN XL) 150 MG 24 hr tablet, TAKE 1 TABLET BY MOUTH ONCE DAILY, Disp: 90 tablet, Rfl: 0 .  buPROPion (WELLBUTRIN XL) 300 MG 24 hr tablet, TAKE 1 TABLET BY MOUTH ONCE DAILY, Disp: 90 tablet, Rfl: 0 .  clopidogrel (PLAVIX) 75 MG tablet, Take 1 tablet (75 mg total) by mouth daily with breakfast., Disp: 90 tablet, Rfl: 3 .  glucose blood (FREESTYLE LITE) test strip, Dx DM E11.69 Check blood glucose up to 5 times daily., Disp: 300 each, Rfl: 12 .  insulin degludec (TRESIBA FLEXTOUCH) 100 UNIT/ML FlexTouch Pen, Inject 50 Units into the skin daily., Disp: 150 mL, Rfl: 3 .  insulin lispro (HUMALOG) 100 UNIT/ML KwikPen, Inject 10-24 Units into the skin 3 (three) times daily before meals., Disp: 15 mL, Rfl: 11 .  JARDIANCE 25 MG TABS  tablet, TAKE 1 TABLET BY MOUTH ONCE DAILY, Disp: 90 tablet, Rfl: 0 .  Lancets (FREESTYLE) lancets, Dx DM E11.69 Check blood glucose up to 5 times daily., Disp: 300 each, Rfl: 12 .  metoCLOPramide (REGLAN) 10 MG tablet, Take 10 mg by mouth daily as needed for nausea or vomiting (upset stomach). PRN, Disp: , Rfl:  .  metoprolol tartrate (LOPRESSOR) 25 MG tablet, Take 1 tablet (25 mg total) by mouth 2 (two) times daily., Disp: 180 tablet, Rfl: 3 .  nitroGLYCERIN (NITROSTAT) 0.4 MG SL tablet, Place 1 tablet (0.4 mg total) under the tongue every 5 (five) minutes as needed for chest pain., Disp: 60 tablet, Rfl: 0 .  ondansetron (ZOFRAN ODT) 4 MG disintegrating tablet, Take 1 tablet (4 mg total) by mouth every 8 (eight) hours as needed for nausea or vomiting., Disp: 20 tablet, Rfl: 0 .  pantoprazole (PROTONIX) 40 MG tablet, TAKE 1 TABLET BY MOUTH ONCE DAILY, Disp: 90 tablet, Rfl: 0 .  tamsulosin (FLOMAX) 0.4 MG CAPS capsule, TAKE 1 CAPSULE BY MOUTH ONCE DAILY., Disp: 90 capsule, Rfl: 0 .  Testosterone Cypionate 200 MG/ML SOLN, Inject 200 mg into the muscle every Sunday., Disp: 10 mL, Rfl: 1 .  tiZANidine (ZANAFLEX) 4 MG tablet, Take 2 tablets (8 mg total) by mouth every 8 (eight) hours as needed for muscle spasms., Disp: , Rfl:  .  traMADol (ULTRAM) 50 MG  tablet, Take 2 tablets (100 mg total) by mouth 3 (three) times daily as needed., Disp: 180 tablet, Rfl: 0 .  traZODone (DESYREL) 50 MG tablet, Take 1-2 tablets (50-100 mg total) by mouth at bedtime as needed for sleep., Disp: 60 tablet, Rfl: 3 .  triazolam (HALCION) 0.25 MG tablet, 1-2 tabs PO 2 hours before procedure or imaging.  Do not drive with this medication., Disp: 2 tablet, Rfl: 0  Past Medical History: Past Medical History:  Diagnosis Date  . Anxiety   . Basal cell carcinoma (BCC) in situ of skin   . Colon polyps   . Coronary artery disease   . Depression   . Diabetes mellitus without complication ([place])   . Hypertension   . SBO (small  bowel obstruction) ([facility])     Tobacco Use: Social History   Tobacco Use  Smoking Status Former Smoker  . Quit date: 2007  . Years since quitting: 15.2  Smokeless Tobacco Never Used    Labs: Recent Chemical engineer    Labs for ITP Cardiac and Pulmonary Rehab Latest Ref Rng & Units 04/07/2020 08/13/2020 10/23/2020 11/15/2020   Cholestrol 100 - 199 mg/dL - 160 113 -   LDLCALC 0 - 99 mg/dL - 76 53 -   HDL >39 mg/dL - 32(L) 45 -   Trlycerides 0 - 149 mg/dL - 258(H) 74 -   Hemoglobin A1c 4.0 - 5.6 % 7.2(H) 10.1(H) - 6.5(A)      Capillary Blood Glucose: Lab Results  Component Value Date   GLUCAP 171 (H) 11/15/2020   GLUCAP 255 (H) 11/15/2020   GLUCAP 140 (H) 11/13/2020   GLUCAP 165 (H) 11/13/2020   GLUCAP 135 (H) 08/16/2020     Exercise Target Goals: Exercise Program Goal: Individual exercise prescription set using results from initial 6 min walk test and THRR while considering  patient's activity barriers and safety.   Exercise Prescription Goal: Initial exercise prescription builds to 30-45 minutes a day of aerobic activity, 2-3 days per week.  Home exercise guidelines will be given to patient during program as part of exercise prescription that the participant will acknowledge.  Activity Barriers & Risk Stratification:  Activity Barriers & Cardiac Risk Stratification - 11/07/20 1044      Activity Barriers & Cardiac Risk Stratification   Activity Barriers Arthritis;Back Problems;Joint Problems;Fibromyalgia;Other (comment)    Comments Back surgery in 2006 with ongoing back pain. L3-L5 fused, S1 joints arthritic and degenerative. ROM in back decreased by 40%. Has spinal cord stimulator. Hip pain with prolonged walking.    Cardiac Risk Stratification Moderate           6 Minute Walk:  6 Minute Walk    Row Name 11/07/20 1054         6 Minute Walk   Phase Initial     Distance 1064 feet     Walk Time 6 minutes     # of Rest Breaks 0     MPH 2.01     METS 2.24      RPE 13     Perceived Dyspnea  1     VO2 Peak 7.84     Symptoms Yes (comment)     Comments Patient reported 6/10 left hip pain and mild SOB, 1/4 on dyspnea scale.     Resting HR 79 bpm     Resting BP 102/60     Resting Oxygen Saturation  98 %     Exercise Oxygen Saturation  during 6 min  walk 98 %     Max Ex. HR 86 bpm     Max Ex. BP 134/72     2 Minute Post BP 118/70            Oxygen Initial Assessment:   Oxygen Re-Evaluation:   Oxygen Discharge (Final Oxygen Re-Evaluation):   Initial Exercise Prescription:  Initial Exercise Prescription - 11/07/20 1100      Date of Initial Exercise RX and Referring Provider   Date 11/07/20    Referring Provider Freada Bergeron, MD    Expected Discharge Date 01/05/21      T5 Nustep   Level 2    SPM 85    Minutes 25    METs 1.8      Prescription Details   Frequency (times per week) 3    Duration Progress to 30 minutes of continuous aerobic without signs/symptoms of physical distress      Intensity   THRR 40-80% of Max Heartrate 62-124    Ratings of Perceived Exertion 11-13    Perceived Dyspnea 0-4      Progression   Progression Continue to progress workloads to maintain intensity without signs/symptoms of physical distress.      Resistance Training   Training Prescription Yes    Weight 4 lbs    Reps 10-15           Perform Capillary Blood Glucose checks as needed.  Exercise Prescription Changes:   Exercise Prescription Changes    Row Name 11/13/20 1048 11/20/20 1044           Response to Exercise   Blood Pressure (Admit) 120/60 150/76      Blood Pressure (Exercise) 122/62 128/64      Blood Pressure (Exit) 118/68 118/70      Heart Rate (Admit) 89 bpm 92 bpm      Heart Rate (Exercise) 89 bpm 92 bpm      Heart Rate (Exit) 81 bpm 85 bpm      Rating of Perceived Exertion (Exercise) 11 11      Symptoms Patient c/o SI joint pain, which is chronic for him. --      Comments Patient completed 1st session of  exercise fair with some joint pain. --      Duration Progress to 30 minutes of  aerobic without signs/symptoms of physical distress Progress to 30 minutes of  aerobic without signs/symptoms of physical distress      Intensity THRR unchanged THRR unchanged             Progression   Progression Continue to progress workloads to maintain intensity without signs/symptoms of physical distress. Continue to progress workloads to maintain intensity without signs/symptoms of physical distress.      Average METs 1.5 1.7             Resistance Training   Training Prescription Yes Yes      Weight 4 lbs 4 lbs      Reps 10-15 10-15      Time 10 Minutes 10 Minutes             Interval Training   Interval Training No No             NuStep   Level 2 2      SPM 85 85      Minutes 25 25      METs 1.5 1.7             T5 Nustep  Level -- --      SPM -- --      Minutes -- --      METs -- --             Exercise Comments:   Exercise Comments    Row Name 11/13/20 1141           Exercise Comments Patient completed 1st session of exercise fair with some joint pain.              Exercise Goals and Review:   Exercise Goals    Row Name 11/07/20 1045             Exercise Goals   Increase Physical Activity Yes       Intervention Provide advice, education, support and counseling about physical activity/exercise needs.;Develop an individualized exercise prescription for aerobic and resistive training based on initial evaluation findings, risk stratification, comorbidities and participant's personal goals.       Expected Outcomes Short Term: Attend rehab on a regular basis to increase amount of physical activity.;Long Term: Exercising regularly at least 3-5 days a week.;Long Term: Add in home exercise to make exercise part of routine and to increase amount of physical activity.       Increase Strength and Stamina Yes       Intervention Provide advice, education, support and  counseling about physical activity/exercise needs.;Develop an individualized exercise prescription for aerobic and resistive training based on initial evaluation findings, risk stratification, comorbidities and participant's personal goals.       Expected Outcomes Short Term: Increase workloads from initial exercise prescription for resistance, speed, and METs.;Short Term: Perform resistance training exercises routinely during rehab and add in resistance training at home;Long Term: Improve cardiorespiratory fitness, muscular endurance and strength as measured by increased METs and functional capacity (6MWT)       Able to understand and use rate of perceived exertion (RPE) scale Yes       Intervention Provide education and explanation on how to use RPE scale       Expected Outcomes Short Term: Able to use RPE daily in rehab to express subjective intensity level;Long Term:  Able to use RPE to guide intensity level when exercising independently       Knowledge and understanding of Target Heart Rate Range (THRR) Yes       Intervention Provide education and explanation of THRR including how the numbers were predicted and where they are located for reference       Expected Outcomes Short Term: Able to state/look up THRR;Long Term: Able to use THRR to govern intensity when exercising independently;Short Term: Able to use daily as guideline for intensity in rehab       Able to check pulse independently Yes       Intervention Provide education and demonstration on how to check pulse in carotid and radial arteries.;Review the importance of being able to check your own pulse for safety during independent exercise       Expected Outcomes Short Term: Able to explain why pulse checking is important during independent exercise;Long Term: Able to check pulse independently and accurately       Understanding of Exercise Prescription Yes       Intervention Provide education, explanation, and written materials on patient's  individual exercise prescription       Expected Outcomes Short Term: Able to explain program exercise prescription;Long Term: Able to explain home exercise prescription to exercise independently  Exercise Goals Re-Evaluation :  Exercise Goals Re-Evaluation    [place] Name 11/13/20 1141             Exercise Goal Re-Evaluation   Exercise Goals Review Able to understand and use rate of perceived exertion (RPE) scale;Increase Physical Activity       Comments Patient able to understand and use RPE scale appropriately.       Expected Outcomes Progress workloads as tolerated to help achieve personal health and fitness goals.              Discharge Exercise Prescription (Final Exercise Prescription Changes):  Exercise Prescription Changes - 11/20/20 1044      Response to Exercise   Blood Pressure (Admit) 150/76    Blood Pressure (Exercise) 128/64    Blood Pressure (Exit) 118/70    Heart Rate (Admit) 92 bpm    Heart Rate (Exercise) 92 bpm    Heart Rate (Exit) 85 bpm    Rating of Perceived Exertion (Exercise) 11    Duration Progress to 30 minutes of  aerobic without signs/symptoms of physical distress    Intensity THRR unchanged      Progression   Progression Continue to progress workloads to maintain intensity without signs/symptoms of physical distress.    Average METs 1.7      Resistance Training   Training Prescription Yes    Weight 4 lbs    Reps 10-15    Time 10 Minutes      Interval Training   Interval Training No      NuStep   Level 2    SPM 85    Minutes 25    METs 1.7           Nutrition:  Target Goals: Understanding of nutrition guidelines, daily intake of sodium '1500mg'$ , cholesterol '200mg'$ , calories 30% from fat and 7% or less from saturated fats, daily to have 5 or more servings of fruits and vegetables.  Biometrics:  Pre Biometrics - 11/07/20 1050      Pre Biometrics   Waist Circumference 46.5 inches    Hip Circumference 43.25 inches     Waist to Hip Ratio 1.08 %    Triceps Skinfold 10 mm    % Body Fat 31.2 %    Grip Strength 44.5 kg    Flexibility --   N/A due to back problems.   Single Leg Stand 5.93 seconds            Nutrition Therapy Plan and Nutrition Goals:   Nutrition Assessments:  MEDIFICTS Score Key:  ?70 Need to make dietary changes   40-70 Heart Healthy Diet  ? 40 Therapeutic Level Cholesterol Diet    Picture Your Plate Scores:  <09 Unhealthy dietary pattern with much room for improvement.  41-50 Dietary pattern unlikely to meet recommendations for good health and room for improvement.  51-60 More healthful dietary pattern, with some room for improvement.   >60 Healthy dietary pattern, although there may be some specific behaviors that could be improved.    Nutrition Goals Re-Evaluation:   Nutrition Goals Re-Evaluation:   Nutrition Goals Discharge (Final Nutrition Goals Re-Evaluation):   Psychosocial: Target Goals: Acknowledge presence or absence of significant depression and/or stress, maximize coping skills, provide positive support system. Participant is able to verbalize types and ability to use techniques and skills needed for reducing stress and depression.  Initial Review & Psychosocial Screening:  Initial Psych Review & Screening - 11/07/20 1201      Initial Review  Current issues with History of Depression;Current Anxiety/Panic;Current Stress Concerns    Source of Stress Concerns Chronic Illness;Unable to perform yard/household activities    Comments Fannie has been disabled due to breaking his back in 2006 continues to have problems with back pain. History of depression currently controlled on two antidepressants.      Family Dynamics   Good Support System? Yes   Ji has his wife and 4 children for suport     Barriers   Psychosocial barriers to participate in program The patient should benefit from training in stress management and relaxation.      Screening  Interventions   Interventions Encouraged to exercise;To provide support and resources with identified psychosocial needs;Provide feedback about the scores to participant    Expected Outcomes Long Term Goal: Stressors or current issues are controlled or eliminated.;Short Term goal: Identification and review with participant of any Quality of Life or Depression concerns found by scoring the questionnaire.           Quality of Life Scores:  Quality of Life - 11/07/20 1141      Quality of Life   Select Quality of Life      Quality of Life Scores   Health/Function Pre 17.9 %    Socioeconomic Pre 20.33 %    Psych/Spiritual Pre 21.93 %    Family Pre 25 %    GLOBAL Pre 20.27 %          Scores of 19 and below usually indicate a poorer quality of life in these areas.  A difference of  2-3 points is a clinically meaningful difference.  A difference of 2-3 points in the total score of the Quality of Life Index has been associated with significant improvement in overall quality of life, self-image, physical symptoms, and general health in studies assessing change in quality of life.  PHQ-9: Recent Review Flowsheet Data    Depression screen [facility] 2/9 11/07/2020 06/02/2020   Decreased Interest 0 1   Down, Depressed, Hopeless 0 1   PHQ - 2 Score 0 2   Altered sleeping - 2   Tired, decreased energy - 2   Change in appetite - 2   Feeling bad or failure about yourself  - 0   Trouble concentrating - 0   Moving slowly or fidgety/restless - 0   Suicidal thoughts - 0   PHQ-9 Score - 8   Difficult doing work/chores - Somewhat difficult     Interpretation of Total Score  Total Score Depression Severity:  1-4 = Minimal depression, 5-9 = Mild depression, 10-14 = Moderate depression, 15-19 = Moderately severe depression, 20-27 = Severe depression   Psychosocial Evaluation and Intervention:   Psychosocial Re-Evaluation:  Psychosocial Re-Evaluation    [place] Name 11/20/20 1631              Psychosocial Re-Evaluation   Current issues with History of Depression;Current Stress Concerns;Current Anxiety/Panic       Comments Dawsyn denies having increased depression and anxiety. Quality of life questionnaire forwarded to Dr Zigmund Daniel. Jayleen's depression is currently controlled on 2 antidepressants       Expected Outcomes Yazen will have less anxiety and depression upon completion of phase 2 cardiac rehab.       Interventions Relaxation education;Encouraged to attend Cardiac Rehabilitation for the exercise;Stress management education       Continue Psychosocial Services  Follow up required by staff       Comments Will continue to monitor and offer suport  as needed               Initial Review   Source of Stress Concerns Chronic Illness;Unable to perform yard/household activities;Retirement/disability              Psychosocial Discharge (Final Psychosocial Re-Evaluation):  Psychosocial Re-Evaluation - 11/20/20 1631      Psychosocial Re-Evaluation   Current issues with History of Depression;Current Stress Concerns;Current Anxiety/Panic    Comments Marvell denies having increased depression and anxiety. Quality of life questionnaire forwarded to Dr Zigmund Daniel. Younes's depression is currently controlled on 2 antidepressants    Expected Outcomes Travarius will have less anxiety and depression upon completion of phase 2 cardiac rehab.    Interventions Relaxation education;Encouraged to attend Cardiac Rehabilitation for the exercise;Stress management education    Continue Psychosocial Services  Follow up required by staff    Comments Will continue to monitor and offer suport as needed      Initial Review   Source of Stress Concerns Chronic Illness;Unable to perform yard/household activities;Retirement/disability           Vocational Rehabilitation: Provide vocational rehab assistance to qualifying candidates.   Vocational Rehab Evaluation & Intervention:  Vocational Rehab - 11/07/20 1205       Initial Vocational Rehab Evaluation & Intervention   Assessment shows need for Vocational Rehabilitation No   Stephens is disabled and does not need vocational rehab at this time          Education: Education Goals: Education classes will be provided on a weekly basis, covering required topics. Participant will state understanding/return demonstration of topics presented.  Learning Barriers/Preferences:  Learning Barriers/Preferences - 11/07/20 1204      Learning Barriers/Preferences   Learning Barriers Exercise Concerns   Dizziness when standing too quick   Learning Preferences Skilled Demonstration           Education Topics: Count Your Pulse:  -Group instruction provided by verbal instruction, demonstration, patient participation and written materials to support subject.  Instructors address importance of being able to find your pulse and how to count your pulse when at home without a heart monitor.  Patients get hands on experience counting their pulse with staff help and individually.   Heart Attack, Angina, and Risk Factor Modification:  -Group instruction provided by verbal instruction, video, and written materials to support subject.  Instructors address signs and symptoms of angina and heart attacks.    Also discuss risk factors for heart disease and how to make changes to improve heart health risk factors.   Functional Fitness:  -Group instruction provided by verbal instruction, demonstration, patient participation, and written materials to support subject.  Instructors address safety measures for doing things around the house.  Discuss how to get up and down off the floor, how to pick things up properly, how to safely get out of a chair without assistance, and balance training.   Meditation and Mindfulness:  -Group instruction provided by verbal instruction, patient participation, and written materials to support subject.  Instructor addresses importance of  mindfulness and meditation practice to help reduce stress and improve awareness.  Instructor also leads participants through a meditation exercise.    Stretching for Flexibility and Mobility:  -Group instruction provided by verbal instruction, patient participation, and written materials to support subject.  Instructors lead participants through series of stretches that are designed to increase flexibility thus improving mobility.  These stretches are additional exercise for major muscle groups that are typically performed during regular warm up  and cool down.   Hands Only CPR:  -Group verbal, video, and participation provides a basic overview of AHA guidelines for community CPR. Role-play of emergencies allow participants the opportunity to practice calling for help and chest compression technique with discussion of AED use.   Hypertension: -Group verbal and written instruction that provides a basic overview of hypertension including the most recent diagnostic guidelines, risk factor reduction with self-care instructions and medication management.    Nutrition I class: Heart Healthy Eating:  -Group instruction provided by PowerPoint slides, verbal discussion, and written materials to support subject matter. The instructor gives an explanation and review of the Therapeutic Lifestyle Changes diet recommendations, which includes a discussion on lipid goals, dietary fat, sodium, fiber, plant stanol/sterol esters, sugar, and the components of a well-balanced, healthy diet.   Nutrition II class: Lifestyle Skills:  -Group instruction provided by PowerPoint slides, verbal discussion, and written materials to support subject matter. The instructor gives an explanation and review of label reading, grocery shopping for heart health, heart healthy recipe modifications, and ways to make healthier choices when eating out.   Diabetes Question & Answer:  -Group instruction provided by PowerPoint slides,  verbal discussion, and written materials to support subject matter. The instructor gives an explanation and review of diabetes co-morbidities, pre- and post-prandial blood glucose goals, pre-exercise blood glucose goals, signs, symptoms, and treatment of hypoglycemia and hyperglycemia, and foot care basics.   Diabetes Blitz:  -Group instruction provided by PowerPoint slides, verbal discussion, and written materials to support subject matter. The instructor gives an explanation and review of the physiology behind type 1 and type 2 diabetes, diabetes medications and rational behind using different medications, pre- and post-prandial blood glucose recommendations and Hemoglobin A1c goals, diabetes diet, and exercise including blood glucose guidelines for exercising safely.    Portion Distortion:  -Group instruction provided by PowerPoint slides, verbal discussion, written materials, and food models to support subject matter. The instructor gives an explanation of serving size versus portion size, changes in portions sizes over the last 20 years, and what consists of a serving from each food group.   Stress Management:  -Group instruction provided by verbal instruction, video, and written materials to support subject matter.  Instructors review role of stress in heart disease and how to cope with stress positively.     Exercising on Your Own:  -Group instruction provided by verbal instruction, power point, and written materials to support subject.  Instructors discuss benefits of exercise, components of exercise, frequency and intensity of exercise, and end points for exercise.  Also discuss use of nitroglycerin and activating EMS.  Review options of places to exercise outside of rehab.  Review guidelines for sex with heart disease.   Cardiac Drugs I:  -Group instruction provided by verbal instruction and written materials to support subject.  Instructor reviews cardiac drug classes: antiplatelets,  anticoagulants, beta blockers, and statins.  Instructor discusses reasons, side effects, and lifestyle considerations for each drug class.   Cardiac Drugs II:  -Group instruction provided by verbal instruction and written materials to support subject.  Instructor reviews cardiac drug classes: angiotensin converting enzyme inhibitors (ACE-I), angiotensin II receptor blockers (ARBs), nitrates, and calcium channel blockers.  Instructor discusses reasons, side effects, and lifestyle considerations for each drug class.   Anatomy and Physiology of the Circulatory System:  Group verbal and written instruction and models provide basic cardiac anatomy and physiology, with the coronary electrical and arterial systems. Review of: AMI, Angina, Valve disease, Heart Failure,  Peripheral Artery Disease, Cardiac Arrhythmia, Pacemakers, and the ICD.   Other Education:  -Group or individual verbal, written, or video instructions that support the educational goals of the cardiac rehab program.   Holiday Eating Survival Tips:  -Group instruction provided by PowerPoint slides, verbal discussion, and written materials to support subject matter. The instructor gives patients tips, tricks, and techniques to help them not only survive but enjoy the holidays despite the onslaught of food that accompanies the holidays.   Knowledge Questionnaire Score:  Knowledge Questionnaire Score - 11/07/20 1141      Knowledge Questionnaire Score   Pre Score 21/24           Core Components/Risk Factors/Patient Goals at Admission:  Personal Goals and Risk Factors at Admission - 11/07/20 1041      Core Components/Risk Factors/Patient Goals on Admission    Weight Management Yes;Obesity;Weight Loss    Intervention Weight Management/Obesity: Establish reasonable short term and long term weight goals.;Obesity: Provide education and appropriate resources to help participant work on and attain dietary goals.    Admit Weight 241 lb  13.5 oz (109.7 kg)    Goal Weight: Short Term 235 lb (106.6 kg)    Goal Weight: Long Term 199 lb (90.3 kg)    Expected Outcomes Short Term: Continue to assess and modify interventions until short term weight is achieved;Long Term: Adherence to nutrition and physical activity/exercise program aimed toward attainment of established weight goal;Weight Loss: Understanding of general recommendations for a balanced deficit meal plan, which promotes 1-2 lb weight loss per week and includes a negative energy balance of [phone] kcal/d    Diabetes Yes    Intervention Provide education about signs/symptoms and action to take for hypo/hyperglycemia.;Provide education about proper nutrition, including hydration, and aerobic/resistive exercise prescription along with prescribed medications to achieve blood glucose in normal ranges: Fasting glucose 65-99 mg/dL    Expected Outcomes Short Term: Participant verbalizes understanding of the signs/symptoms and immediate care of hyper/hypoglycemia, proper foot care and importance of medication, aerobic/resistive exercise and nutrition plan for blood glucose control.;Long Term: Attainment of HbA1C < 7%.    Hypertension Yes    Intervention Provide education on lifestyle modifcations including regular physical activity/exercise, weight management, moderate sodium restriction and increased consumption of fresh fruit, vegetables, and low fat dairy, alcohol moderation, and smoking cessation.;Monitor prescription use compliance.    Expected Outcomes Short Term: Continued assessment and intervention until BP is < 140/80mm HG in hypertensive participants. < 130/78mm HG in hypertensive participants with diabetes, heart failure or chronic kidney disease.;Long Term: Maintenance of blood pressure at goal levels.    Lipids Yes    Intervention Provide education and support for participant on nutrition & aerobic/resistive exercise along with prescribed medications to achieve LDL '70mg'$ , HDL  >$Rem'40mg'zyuQ$ .    Expected Outcomes Short Term: Participant states understanding of desired cholesterol values and is compliant with medications prescribed. Participant is following exercise prescription and nutrition guidelines.;Long Term: Cholesterol controlled with medications as prescribed, with individualized exercise RX and with personalized nutrition plan. Value goals: LDL < $Rem'70mg'WavQ$ , HDL > 40 mg.    Stress Yes    Intervention Offer individual and/or small group education and counseling on adjustment to heart disease, stress management and health-related lifestyle change. Teach and support self-help strategies.;Refer participants experiencing significant psychosocial distress to appropriate mental health specialists for further evaluation and treatment. When possible, include family members and significant others in education/counseling sessions.    Expected Outcomes Short Term: Participant demonstrates changes in health-related behavior,  relaxation and other stress management skills, ability to obtain effective social support, and compliance with psychotropic medications if prescribed.;Long Term: Emotional wellbeing is indicated by absence of clinically significant psychosocial distress or social isolation.           Core Components/Risk Factors/Patient Goals Review:   Goals and Risk Factor Review    Row Name 11/14/20 0818             Core Components/Risk Factors/Patient Goals Review   Personal Goals Review Weight Management/Obesity;Diabetes;Hypertension;Stress;Lipids       Review Ned started exercise on 11/13/20. [place]'s vital signs and CBG's were stabe       Expected Outcomes Jemuel will continue to participate in phase 2 cardiac rehab for exercise nutrition and lifestyle modification              Core Components/Risk Factors/Patient Goals at Discharge (Final Review):   Goals and Risk Factor Review - 11/14/20 0818      Core Components/Risk Factors/Patient Goals Review   Personal Goals  Review Weight Management/Obesity;Diabetes;Hypertension;Stress;Lipids    Review Cruz started exercise on 11/13/20. Viviano's vital signs and CBG's were stabe    Expected Outcomes Ronnell will continue to participate in phase 2 cardiac rehab for exercise nutrition and lifestyle modification           ITP Comments:  ITP Comments    Row Name 11/07/20 1051 11/14/20 0815 11/20/20 1630       ITP Comments Dr Fransico Him MD, Medical Director 30 Day ITP Review. Halford started exercise on 11/13/20 and did fair with exercise 30 Day ITP Review. [place] is off to a good start to exercise despite orthapedic limitations            Comments: See ITP comments.

## 2020-11-20 NOTE — Telephone Encounter (Signed)
Patient aware Rx has been sent in. 

## 2020-11-20 NOTE — Telephone Encounter (Signed)
Patient called to inquire about getting his dose increased for this refill of his Tramadol.

## 2020-11-20 NOTE — Telephone Encounter (Signed)
Done

## 2020-11-22 ENCOUNTER — Other Ambulatory Visit: Payer: Self-pay

## 2020-11-22 ENCOUNTER — Encounter (HOSPITAL_COMMUNITY)
Admission: RE | Admit: 2020-11-22 | Discharge: 2020-11-22 | Disposition: A | Discharge status: Home / Self Care | Payer: No Typology Code available for payment source | Source: Ambulatory Visit | Attending: Cardiology | Admitting: Cardiology

## 2020-11-22 DIAGNOSIS — Z955 Presence of coronary angioplasty implant and graft: Secondary | ICD-10-CM | POA: Diagnosis not present

## 2020-11-24 ENCOUNTER — Other Ambulatory Visit: Payer: Self-pay

## 2020-11-24 ENCOUNTER — Encounter: Payer: Self-pay | Admitting: Endocrinology

## 2020-11-24 ENCOUNTER — Encounter (HOSPITAL_COMMUNITY)
Admission: RE | Admit: 2020-11-24 | Discharge: 2020-11-24 | Disposition: A | Discharge status: Home / Self Care | Payer: No Typology Code available for payment source | Source: Ambulatory Visit | Attending: Cardiology | Admitting: Cardiology

## 2020-11-24 VITALS — Wt 243.0 lb

## 2020-11-24 DIAGNOSIS — Z955 Presence of coronary angioplasty implant and graft: Secondary | ICD-10-CM | POA: Insufficient documentation

## 2020-11-24 NOTE — Progress Notes (Signed)
Brandon [name] 65 y.o. male Nutrition Note  Diagnosis: DES RCA  Past Medical History:  Diagnosis Date  . Anxiety   . Basal cell carcinoma (BCC) in situ of skin   . Colon polyps   . Coronary artery disease   . Depression   . Diabetes mellitus without complication ([place])   . Hypertension   . SBO (small bowel obstruction) ([facility])      Medications reviewed.   Current Outpatient Medications:  .  amLODipine (NORVASC) 10 MG tablet, Take 10 mg by mouth every morning., Disp: , Rfl:  .  ARIPiprazole (ABILIFY) 10 MG tablet, Take 10 mg by mouth every morning., Disp: , Rfl:  .  aspirin 81 MG EC tablet, Take 81 mg by mouth every morning., Disp: , Rfl:  .  atorvastatin (LIPITOR) 80 MG tablet, Take 1 tablet (80 mg total) by mouth daily., Disp: 90 tablet, Rfl: 3 .  Blood Glucose Monitoring Suppl (FREESTYLE LITE) w/Device KIT, 1 each by Does not apply route 3 (three) times daily as needed., Disp: 1 kit, Rfl: prn .  Blood Pressure Monitoring (OMRON 3 SERIES BP MONITOR) DEVI, , Disp: , Rfl:  .  buPROPion (WELLBUTRIN XL) 150 MG 24 hr tablet, TAKE 1 TABLET BY MOUTH ONCE DAILY, Disp: 90 tablet, Rfl: 0 .  buPROPion (WELLBUTRIN XL) 300 MG 24 hr tablet, TAKE 1 TABLET BY MOUTH ONCE DAILY, Disp: 90 tablet, Rfl: 0 .  clopidogrel (PLAVIX) 75 MG tablet, Take 1 tablet (75 mg total) by mouth daily with breakfast., Disp: 90 tablet, Rfl: 3 .  glucose blood (FREESTYLE LITE) test strip, Dx DM E11.69 Check blood glucose up to 5 times daily., Disp: 300 each, Rfl: 12 .  insulin degludec (TRESIBA FLEXTOUCH) 100 UNIT/ML FlexTouch Pen, Inject 50 Units into the skin daily., Disp: 150 mL, Rfl: 3 .  insulin lispro (HUMALOG) 100 UNIT/ML KwikPen, Inject 10-24 Units into the skin 3 (three) times daily before meals., Disp: 15 mL, Rfl: 11 .  JARDIANCE 25 MG TABS tablet, TAKE 1 TABLET BY MOUTH ONCE DAILY, Disp: 90 tablet, Rfl: 0 .  Lancets (FREESTYLE) lancets, Dx DM E11.69 Check blood glucose up to 5 times daily., Disp: 300 each, Rfl:  12 .  metoCLOPramide (REGLAN) 10 MG tablet, Take 10 mg by mouth daily as needed for nausea or vomiting (upset stomach). PRN, Disp: , Rfl:  .  metoprolol tartrate (LOPRESSOR) 25 MG tablet, Take 1 tablet (25 mg total) by mouth 2 (two) times daily., Disp: 180 tablet, Rfl: 3 .  nitroGLYCERIN (NITROSTAT) 0.4 MG SL tablet, Place 1 tablet (0.4 mg total) under the tongue every 5 (five) minutes as needed for chest pain., Disp: 60 tablet, Rfl: 0 .  ondansetron (ZOFRAN ODT) 4 MG disintegrating tablet, Take 1 tablet (4 mg total) by mouth every 8 (eight) hours as needed for nausea or vomiting., Disp: 20 tablet, Rfl: 0 .  pantoprazole (PROTONIX) 40 MG tablet, TAKE 1 TABLET BY MOUTH ONCE DAILY, Disp: 90 tablet, Rfl: 0 .  tamsulosin (FLOMAX) 0.4 MG CAPS capsule, TAKE 1 CAPSULE BY MOUTH ONCE DAILY., Disp: 90 capsule, Rfl: 0 .  Testosterone Cypionate 200 MG/ML SOLN, Inject 200 mg into the muscle every Sunday., Disp: 10 mL, Rfl: 1 .  tiZANidine (ZANAFLEX) 4 MG tablet, Take 2 tablets (8 mg total) by mouth every 8 (eight) hours as needed for muscle spasms., Disp: , Rfl:  .  traMADol (ULTRAM) 50 MG tablet, Take 2 tablets (100 mg total) by mouth 3 (three) times daily as  needed., Disp: 180 tablet, Rfl: 0 .  traZODone (DESYREL) 50 MG tablet, Take 1-2 tablets (50-100 mg total) by mouth at bedtime as needed for sleep., Disp: 60 tablet, Rfl: 3 .  triazolam (HALCION) 0.25 MG tablet, 1-2 tabs PO 2 hours before procedure or imaging.  Do not drive with this medication., Disp: 2 tablet, Rfl: 0   Ht Readings from Last 1 Encounters:  11/15/20 '5\' 9"'  (1.753 m)     Wt Readings from Last 3 Encounters:  11/15/20 243 lb 6.4 oz (110.4 kg)  11/07/20 241 lb 13.5 oz (109.7 kg)  09/01/20 217 lb (98.4 kg)     There is no height or weight on file to calculate BMI.   Social History   Tobacco Use  Smoking Status Former Smoker  . Quit date: 2007  . Years since quitting: 15.2  Smokeless Tobacco Never Used     Lab Results  Component  Value Date   CHOL 113 10/23/2020   Lab Results  Component Value Date   HDL 45 10/23/2020   Lab Results  Component Value Date   LDLCALC 53 10/23/2020   Lab Results  Component Value Date   TRIG 74 10/23/2020     Lab Results  Component Value Date   HGBA1C 6.5 (A) 11/15/2020     CBG (last 3)  No results for input(s): GLUCAP in the last 72 hours.   Nutrition Note  Spoke with pt. Nutrition Plan and Nutrition Survey goals reviewed with pt. Pt is following a Heart Healthy diet. Pt wants to lose wt. Pt has been trying to lose wt by reducing eliminating SSB, and counting carbs/reading labels. He had lost weight prior to Dec 2021. His lowest weight reported was 217 lbs. He has been working on his diet but scared to exercise since heart event. His current weight is 240 lbs. He would like to gain confidence exercising and continue working on diet changes.   Pt has Type 2 Diabetes. Last A1c indicates blood glucose well-controlled. Pt checks CBG's 4 times a day. Fasting CBG's reportedly 115-130 mg/dL. His endo discontinued Pioglitizone last week d/t ankle swelling. He has noticed his fasting CBGs have increase 10-15 mg/dl. His post prandial CBG today was 182 mg/dl.  He is interested in CGM and insulin pump. He tried getting one last year and was unable.  Reviewed diabetes plate method meal planning. No hypoglycemia <70 mg/dl. He occasionally has CBGs in 80's and feels shaky. He verbalized hypoglycemia protocol.   Pt eats out every meal due to busy schedule. He cooks occasionally. He does not foresee his eating out habits changing right now.   Pt expressed understanding of the information reviewed.    Nutrition Diagnosis ? Obese  II = 35-39.9 related to excessive energy intake and sedentary lifestyle as evidenced by a BMI 35.88 kg/m2  Nutrition Intervention ? Pt's individual nutrition plan reviewed with pt. ? Benefits of adopting Heart Healthy diet discussed when Picture Your Plate  reviewed. ? Continue client-centered nutrition education by RD, as part of interdisciplinary care.  Goal(s) ? Pt to identify food quantities necessary to achieve weight loss of 6-24 lb at graduation from cardiac rehab.  ? Pt to build a healthy plate including vegetables, fruits, whole grains, and low-fat dairy products in a heart healthy meal plan. ? Pt to continue checking CBGs and keep WNL.  Plan:   Will provide client-centered nutrition education as part of interdisciplinary care  Monitor and evaluate progress toward nutrition goal with team.  Michaele Offer, MS, RDN, LDN

## 2020-11-25 ENCOUNTER — Other Ambulatory Visit: Payer: Self-pay | Admitting: Endocrinology

## 2020-11-25 ENCOUNTER — Other Ambulatory Visit (HOSPITAL_COMMUNITY): Payer: Self-pay

## 2020-11-25 MED ORDER — DEXCOM G6 RECEIVER DEVI
1.0000 | Freq: Once | 1 refills | Status: AC
  Filled 2020-11-25: qty 1, 90d supply, fill #0

## 2020-11-25 MED ORDER — DEXCOM G6 TRANSMITTER MISC
1.0000 | Freq: Once | 1 refills | Status: AC
  Filled 2020-11-25: qty 1, 1d supply, fill #0
  Filled 2020-12-07: qty 1, 90d supply, fill #0

## 2020-11-25 MED ORDER — DEXCOM G6 SENSOR MISC
1.0000 | 3 refills | Status: DC
  Filled 2020-11-25 – 2020-11-30 (×2): qty 9, 90d supply, fill #0
  Filled 2020-12-04: qty 9, 30d supply, fill #0
  Filled 2020-12-06: qty 3, 30d supply, fill #0
  Filled 2020-12-28 – 2021-11-23 (×2): qty 3, 30d supply, fill #1

## 2020-11-27 ENCOUNTER — Encounter (HOSPITAL_COMMUNITY)
Admission: RE | Admit: 2020-11-27 | Discharge: 2020-11-27 | Disposition: A | Discharge status: Home / Self Care | Payer: No Typology Code available for payment source | Source: Ambulatory Visit | Attending: Cardiology | Admitting: Cardiology

## 2020-11-27 ENCOUNTER — Other Ambulatory Visit (HOSPITAL_COMMUNITY): Payer: Self-pay

## 2020-11-27 ENCOUNTER — Other Ambulatory Visit: Payer: Self-pay

## 2020-11-27 DIAGNOSIS — Z955 Presence of coronary angioplasty implant and graft: Secondary | ICD-10-CM

## 2020-11-27 NOTE — Progress Notes (Deleted)
Cardiology Office Note:    Date:  11/27/2020   ID:  Brandon [name], DOB 10-Apr-1956, MRN [id number]  PCP:  Everrett Coombe, DO   [facility]  Cardiologist:  Meriam Sprague, MD  Advanced Practice Provider:  No care team member to display Electrophysiologist:  None   :[id number]}   Referring MD: Everrett Coombe, DO     History of Present Illness:    Brandon [name] is a 65 y.o. male with a hx of HTN, HLD, DMII, and CAD s/p recent PCI to RCA who presents to clinic for follow-up.  Patient was admitted to the hospital on 08/11/2020 with chest pain.  CTA of the chest showed no evidence of aortic dissection or aneurysm.  Respiratory panel negative for influenza and Covid.  D-dimer negative.  Troponin negative x2.  Echocardiogram obtained on 08/24/2020 showed EF 60 to 65%, no significant valve issue.  Given the atypical nature of his symptoms, he underwent coronary CT on 08/14/2020 which showed coronary calcium score of 1201 which placed the patient in 95th percentile for age and sex matched control, diffuse CAD with beadlike diabetic pattern with suspicion for severe stenosis in the mid RCA, ostial D2 and proximal left circumflex artery.  He subsequently underwent cardiac catheterization on 08/15/2020 which showed 90% mid RCA lesion, 75% proximal left circumflex artery, 75% OM 2, 100% OM1 with faint left to left and right-to-left collaterals, 70% RPDA, 75% D2, 30% distal LAD lesion.  The 90% mid RCA lesion was treated successfully with a 4.0 x 38 mm resolute DES.  The small disease in RPDA, OM1, OM2 at the diagonal vessel was managed medically. Postprocedure, he continued to have chest discomfort.  A bedside echocardiogram obtained on the diet of 08/15/2020 did not reveal any pericardial effusion, there was no change in LV function.  He was treated with colchicine 0.6 mg twice a day, colchicine was subsequently discontinued due to lack of recurrent symptoms.  Hemoglobin A1c  obtained during this admission was 10, he has been advised to follow-up with his primary care provider.  Was last seen by Azalee Course on 08/30/20 where he was doing well. No changes in medication at that time.  Past Medical History:  Diagnosis Date  . Anxiety   . Basal cell carcinoma (BCC) in situ of skin   . Colon polyps   . Coronary artery disease   . Depression   . Diabetes mellitus without complication (HCC)   . Hypertension   . SBO (small bowel obstruction) (HCC)     Past Surgical History:  Procedure Laterality Date  . BACK SURGERY    . CARDIAC CATHETERIZATION    . CATARACT EXTRACTION W/ INTRAOCULAR LENS  IMPLANT, BILATERAL    . CHOLECYSTECTOMY    . CHOLECYSTECTOMY, LAPAROSCOPIC    . CORONARY STENT INTERVENTION N/A 08/15/2020   Procedure: CORONARY STENT INTERVENTION;  Surgeon: Corky Crafts, MD;  Location: [facility] INVASIVE CV LAB;  Service: Cardiovascular;  Laterality: N/A;  . EYE SURGERY    . INTRAVASCULAR ULTRASOUND/IVUS N/A 08/15/2020   Procedure: Intravascular Ultrasound/IVUS;  Surgeon: Corky Crafts, MD;  Location: [facility] INVAS[facility] CV LAB;  Service: Cardiovascular;  Laterality: N/A;  . LEFT HEART CATH AND CORONARY ANGIOGRAPHY N/A 08/15/2020   Procedure: LEFT HEART CATH AND CORONARY ANGIOGRAPHY;  Surgeon: Corky Crafts, MD;  Location: [facility] INVASIVE CV LAB;  Service: Cardiovascular;  Laterality: N/A;  . LUMBAR FUSION     L3-5 with rods  . XI ROBOTIC ASSISTED INGUINAL  HERNIA REPAIR WITH MESH     x 3 surgeries    Current Medications: No outpatient medications have been marked as taking for the 12/04/20 encounter (Appointment) with Meriam Sprague, MD.     Allergies:   Kiwi extract, Other, Penicillins, Ancef [cefazolin], Latex, and Levaquin [levofloxacin]   Social History   Socioeconomic History  . Marital status: Married    Spouse name: Not on file  . Number of children: 4  . Years of education: 7  . Highest education level: Not on file  Occupational  History  . Occupation: disabled  Tobacco Use  . Smoking status: Former Smoker    Quit date: 2007    Years since quitting: 15.2  . Smokeless tobacco: Never Used  Vaping Use  . Vaping Use: Not on file  Substance and Sexual Activity  . Alcohol use: Not Currently  . Drug use: Not Currently  . Sexual activity: Yes  Other Topics Concern  . Not on file  Social History Narrative  . Not on file   Social Determinants of Health   Financial Resource Strain: Not on file  Food Insecurity: Not on file  Transportation Needs: Not on file  Physical Activity: Not on file  Stress: Not on file  Social Connections: Not on file     Family History: The patient's ***family history includes Anxiety disorder in his father and mother; COPD in his mother; Cancer in his father; Depression in his father and mother; Other in his father. There is no history of Diabetes.  ROS:   Please see the history of present illness.    *** All other systems reviewed and are negative.  EKGs/Labs/Other Studies Reviewed:    The following studies were reviewed today: Echo 08/13/2020 1. Left ventricular ejection fraction, by estimation, is 60 to 65%. The  left ventricle has normal function. The left ventricle has no regional  wall motion abnormalities. There is mild left ventricular hypertrophy.  Left ventricular diastolic parameters  are consistent with Grade I diastolic dysfunction (impaired relaxation).  2. Right ventricular systolic function is normal. The right ventricular  size is normal.  3. The mitral valve is normal in structure. No evidence of mitral valve  regurgitation. No evidence of mitral stenosis.  4. The aortic valve is tricuspid. Aortic valve regurgitation is not  visualized. No aortic stenosis is present.  5. The inferior vena cava is normal in size with greater than 50%  respiratory variability, suggesting right atrial pressure of 3 mmHg.  Cath 08/15/20:  Mid RCA lesion is 90%  stenosed.  Prox Cx lesion is 75% stenosed.  2nd Mrg lesion is 75% stenosed.  1st Mrg lesion is 100% stenosed. Faint right to left and left to left collaterals.  RPDA lesion is 70% stenosed.  2nd Diag-1 lesion is 75% stenosed.  2nd Diag-2 lesion is 75% stenosed.  Dist LAD lesion is 50% stenosed.  A drug-eluting stent was successfully placed using a STENT RESOLUTE ONYX 4.0X38, optimized with IVUS.  Post intervention, there is a 0% residual stenosis.  LV end diastolic pressure is normal.  There is no aortic valve stenosis.  75 cm long sheath needed for support due to tortuosity in the right subclavian to engage the RCA. Left coronary could be engaged easily with short sheath.   Diffuse multivessel disease, but no LAD involvement.    RCA successfully treated.  Small vessel disease noted in the RPDA, OM1 and OM2, second diagonal and apical LAD.  Proximal circumflex would be a  potential target for PCI if he had more symptoms.   CTA 08/14/20: 1. Left Main: 0.98.  2. LAD: Proximal: 0.97, distal: 0.87. 3. D1: Proximal: 0.75. 4. LCX: Proximal; 0.94, mid: Occluded. 5. RCA: Proximal: 0.98, distal: 0.77.  IMPRESSION: 1. CT FFR analysis showed significant stenoses in proximal RCA, ostial portion of a large D1 and occluded proximal portion of LCX artery. Left cardiac catheterization is recommended.   FINDINGS: A 100 kV prospective scan was triggered in the descending thoracic aorta at 111 HU's. Axial non-contrast 3 mm slices were carried out through the heart. The data set was analyzed on a dedicated work station and scored using the Agatson method. Gantry rotation speed was 250 msecs and collimation was .6 mm. 100 mg of PO Metoprolol and 0.8 mg of sl NTG were given. The 3D data set was reconstructed in 5% intervals of the 67-82 % of the R-R cycle. Diastolic phases were analyzed on a dedicated work station using MPR, MIP and VRT modes. The patient received 80 cc of  contrast.  Aorta: Normal size. Mild diffuse atherosclerotic plaque and calcifications. No dissection.  Aortic Valve:  Trileaflet.  No calcifications.  Coronary Arteries:  Normal coronary origin.  Right dominance.  RCA is a large dominant artery that gives rise to PDA and PLA. There is mild calcified plaque in the proximal RCA with stenosis 25-49%. Mid RCA has severe diffuse predominantly calcified plaque with a focal stenosis suspicious for > 70%. Distal RCA has moderate calcified plaque prior to bifurcation into PLA and PDA with stenosis 50-69%.  PLA is small with diffuse moderate plaque.  PDA is a medium caliber artery with diffuse moderate plaque in a bead like pattern.  Left main is a large artery that gives rise to LAD and LCX arteries. Left main is a long artery with minimal calcified plaque and stenosis 0-25%.  LAD is a large vessel that gives rise to two diagonal arteries. There is mild diffuse calcified plaque with focal stenoses in the proximal and mid LAD of 25-49%.  D1 is very small.  D2 is a medium caliber artery (2.1 mm) that has severe mixed ostial plaque with lipid rich core and is suspicious for stenosis > 70%.  LCX is a large caliber non-dominant artery that gives rise to two OM branches. Proximal LCX artery has severe non-calcified plaque suspicious for stenosis > 70%, this is followed by a moderate diffuse calcified plaque with stenosis 50-69%. Mid and distal LCX artery has small lumen and mild diffuse plaque.  OM1 is very small and has severe diffuse plaque.  OM2 is a medium caliber artery with diffuse moderate plaque in a bead like pattern.  Other findings:  Normal pulmonary vein drainage into the left atrium.  Normal left atrial appendage without a thrombus.  Normal size of the pulmonary artery.  IMPRESSION: 1. Coronary calcium score of 1201. This was 95 percentile for age and sex matched control.  2. Normal coronary  origin with right dominance.  3. CAD-RADS 4 Severe stenosis. (70-99%). Diffuse CAD in a bead-like diabetic pattern, there is suspicion for severe stenoses in the mid RCA, ostial portion of a large D2 and proximal portion of LCX artery. Additional analysis with CT FFR will be submitted. We will tentatively plan for a left cardiac catheterization for tomorrow.   EKG:  EKG is *** ordered today.  The ekg ordered today demonstrates ***  Recent Labs: 04/16/2020: Magnesium 1.9 08/13/2020: TSH 2.345 08/16/2020: BUN 9; Creatinine, Ser 0.72; Hemoglobin 14.3; Platelets  246; Potassium 3.7; Sodium 136 10/23/2020: ALT 12  Recent Lipid Panel    Component Value Date/Time   CHOL 113 10/23/2020 0824   TRIG 74 10/23/2020 0824   HDL 45 10/23/2020 0824   CHOLHDL 2.5 10/23/2020 0824   CHOLHDL 5.0 08/13/2020 0119   VLDL 52 (H) 08/13/2020 0119   LDLCALC 53 10/23/2020 0824     Risk Assessment/Calculations:   {Does this patient have ATRIAL FIBRILLATION?:[phone]}   Physical Exam:    VS:  There were no vitals taken for this visit.    Wt Readings from Last 3 Encounters:  11/24/20 243 lb (110.2 kg)  11/15/20 243 lb 6.4 oz (110.4 kg)  11/07/20 241 lb 13.5 oz (109.7 kg)     GEN: *** Well nourished, well developed in no acute distress HEENT: Normal NECK: No JVD; No carotid bruits LYMPHATICS: No lymphadenopathy CARDIAC: ***RRR, no murmurs, rubs, gallops RESPIRATORY:  Clear to auscultation without rales, wheezing or rhonchi  ABDOMEN: Soft, non-tender, non-distended MUSCULOSKELETAL:  No edema; No deformity  SKIN: Warm and dry NEUROLOGIC:  Alert and oriented x 3 PSYCHIATRIC:  Normal affect   ASSESSMENT:    No diagnosis found. PLAN:    In order of problems listed above:  #Multivessel CAD s/p PCI to RCA: Doing well without anginal symptoms. -Continue ASA 81mg  daily -Continue plavix 75mg  daily -Continue lipitor 80mg  daily -Change metop to XL  #HTN: -Continue amlodipine -Change metop  to XL  #DMII: A1C significantly improved from 10-->6.5 -Continue insulin and jardiance  #HLD: -Continue lipitor -Goal LDL<70   {Are you ordering a CV Procedure (e.g. stress test, cath, DCCV, TEE, etc)?   Press F2        :[id number]}    Medication Adjustments/Labs and Tests Ordered: Current medicines are reviewed at length with the patient today.  Concerns regarding medicines are outlined above.  No orders of the defined types were placed in this encounter.  No orders of the defined types were placed in this encounter.   There are no Patient Instructions on file for this visit.   Signed, Meriam Sprague, MD  11/27/2020 6:59 PM    [facility]

## 2020-11-28 ENCOUNTER — Other Ambulatory Visit (HOSPITAL_COMMUNITY): Payer: Self-pay

## 2020-11-28 ENCOUNTER — Ambulatory Visit (INDEPENDENT_AMBULATORY_CARE_PROVIDER_SITE_OTHER): Payer: No Typology Code available for payment source | Admitting: Sports Medicine

## 2020-11-28 ENCOUNTER — Ambulatory Visit (INDEPENDENT_AMBULATORY_CARE_PROVIDER_SITE_OTHER): Payer: No Typology Code available for payment source

## 2020-11-28 DIAGNOSIS — M961 Postlaminectomy syndrome, not elsewhere classified: Secondary | ICD-10-CM | POA: Diagnosis not present

## 2020-11-28 DIAGNOSIS — I25110 Atherosclerotic heart disease of native coronary artery with unstable angina pectoris: Secondary | ICD-10-CM

## 2020-11-28 DIAGNOSIS — G959 Disease of spinal cord, unspecified: Secondary | ICD-10-CM

## 2020-11-28 DIAGNOSIS — R42 Dizziness and giddiness: Secondary | ICD-10-CM | POA: Insufficient documentation

## 2020-11-28 MED ORDER — TRIAZOLAM 0.25 MG PO TABS
ORAL_TABLET | ORAL | 0 refills | Status: DC
  Filled 2020-11-28: qty 2, 1d supply, fill #0

## 2020-11-28 NOTE — Assessment & Plan Note (Signed)
Currently going to cardiac rehab, it sounds like he also is getting occasional orthostatic symptoms post exercise, I explained to him that this was fairly common, it happens when he gets off of the recumbent bike, he needs to prolong his cooldown and potentially walk around for a bit after getting off the recumbent bike rather than just stopping.

## 2020-11-28 NOTE — Assessment & Plan Note (Addendum)
There is also complaining of progressive weakness in his legs, we have imaged his lumbar spine, he does have significant lower lumbar facet arthritis as well as a stable appearing lumbar fusion, unfortunately with progressive weakness in the lower extremities and mild foot drop we do need to consider a cervical or thoracic myelopathic source, adding cervical and thoracic x-rays and MRIs. Triazolam for preprocedural anxiolysis.

## 2020-11-28 NOTE — Progress Notes (Signed)
    Procedures performed today:    None.  Independent interpretation of notes and tests performed by another provider:   None.  Brief History, Exam, Impression, and Recommendations:    Vertigo Brandon [name] is reporting a few episodes of feeling like the room is spinning and nearly toppling over when going from a bending to standing position. During these episodes he denies presyncopal symptoms but mostly vertiginous symptoms, I have asked him to discuss this in further detail with his primary care provider as it is outside of my specialty. I will however get him into some vestibular rehab in the meantime. External ear exam is unremarkable.  Coronary artery disease involving native coronary artery of native heart with unstable angina pectoris (HCC) Currently going to cardiac rehab, it sounds like he also is getting occasional orthostatic symptoms post exercise, I explained to him that this was fairly common, it happens when he gets off of the recumbent bike, he needs to prolong his cooldown and potentially walk around for a bit after getting off the recumbent bike rather than just stopping.  Myelopathy (HCC) There is also complaining of progressive weakness in his legs, we have imaged his lumbar spine, he does have significant lower lumbar facet arthritis as well as a stable appearing lumbar fusion, unfortunately with progressive weakness in the lower extremities and mild foot drop we do need to consider a cervical or thoracic myelopathic source, adding cervical and thoracic x-rays and MRIs.     ___________________________________________ Brandon [name]. Benjamin Stain, M.D., ABFM., CAQSM. Primary Care and Sports Medicine [facility]  Adjunct Instructor of Family Medicine  [employer]

## 2020-11-28 NOTE — Assessment & Plan Note (Signed)
Brandon [name] is reporting a few episodes of feeling like the room is spinning and nearly toppling over when going from a bending to standing position. During these episodes he denies presyncopal symptoms but mostly vertiginous symptoms, I have asked him to discuss this in further detail with his primary care provider as it is outside of my specialty. I will however get him into some vestibular rehab in the meantime. External ear exam is unremarkable.

## 2020-11-29 ENCOUNTER — Other Ambulatory Visit (HOSPITAL_COMMUNITY): Payer: Self-pay

## 2020-11-29 ENCOUNTER — Encounter (HOSPITAL_COMMUNITY): Payer: No Typology Code available for payment source

## 2020-11-30 ENCOUNTER — Other Ambulatory Visit (HOSPITAL_COMMUNITY): Payer: Self-pay

## 2020-11-30 NOTE — Telephone Encounter (Signed)
Dexcom has been sent to plan Key: [id number]. Waiting for approval/denial

## 2020-11-30 NOTE — Telephone Encounter (Signed)
Patient called re: Patient's insurance requires a PA for Dexcom G6. Patient does not know if PA forms have been sent to Dr. Everardo All yet.

## 2020-12-01 ENCOUNTER — Encounter (HOSPITAL_COMMUNITY): Payer: No Typology Code available for payment source

## 2020-12-04 ENCOUNTER — Other Ambulatory Visit (HOSPITAL_COMMUNITY): Payer: Self-pay

## 2020-12-04 ENCOUNTER — Encounter: Payer: Self-pay | Admitting: Cardiology

## 2020-12-04 ENCOUNTER — Ambulatory Visit (INDEPENDENT_AMBULATORY_CARE_PROVIDER_SITE_OTHER): Payer: No Typology Code available for payment source | Admitting: Cardiology

## 2020-12-04 ENCOUNTER — Other Ambulatory Visit: Payer: Self-pay

## 2020-12-04 ENCOUNTER — Encounter (HOSPITAL_COMMUNITY): Payer: No Typology Code available for payment source

## 2020-12-04 VITALS — BP 160/82 | HR 90 | Ht 69.0 in | Wt 240.2 lb

## 2020-12-04 DIAGNOSIS — F5101 Primary insomnia: Secondary | ICD-10-CM

## 2020-12-04 DIAGNOSIS — E669 Obesity, unspecified: Secondary | ICD-10-CM

## 2020-12-04 DIAGNOSIS — G4733 Obstructive sleep apnea (adult) (pediatric): Secondary | ICD-10-CM | POA: Diagnosis not present

## 2020-12-04 DIAGNOSIS — I10 Essential (primary) hypertension: Secondary | ICD-10-CM

## 2020-12-04 DIAGNOSIS — I208 Other forms of angina pectoris: Secondary | ICD-10-CM

## 2020-12-04 DIAGNOSIS — Z794 Long term (current) use of insulin: Secondary | ICD-10-CM

## 2020-12-04 DIAGNOSIS — E119 Type 2 diabetes mellitus without complications: Secondary | ICD-10-CM

## 2020-12-04 DIAGNOSIS — I2089 Other forms of angina pectoris: Secondary | ICD-10-CM

## 2020-12-04 DIAGNOSIS — I25118 Atherosclerotic heart disease of native coronary artery with other forms of angina pectoris: Secondary | ICD-10-CM | POA: Diagnosis not present

## 2020-12-04 MED ORDER — ISOSORBIDE MONONITRATE ER 30 MG PO TB24
30.0000 mg | ORAL_TABLET | Freq: Every day | ORAL | 1 refills | Status: DC
  Filled 2020-12-04: qty 90, 90d supply, fill #0
  Filled 2021-03-06: qty 90, 90d supply, fill #1

## 2020-12-04 MED ORDER — METOPROLOL SUCCINATE ER 50 MG PO TB24
50.0000 mg | ORAL_TABLET | Freq: Every day | ORAL | 1 refills | Status: DC
Start: 2020-12-04 — End: 2021-06-01
  Filled 2020-12-04: qty 90, 90d supply, fill #0
  Filled 2021-03-06: qty 90, 90d supply, fill #1

## 2020-12-04 MED ORDER — LOSARTAN POTASSIUM 25 MG PO TABS
25.0000 mg | ORAL_TABLET | Freq: Every day | ORAL | 1 refills | Status: DC
  Filled 2020-12-04: qty 30, 30d supply, fill #0
  Filled 2021-01-08: qty 30, 30d supply, fill #1

## 2020-12-04 NOTE — Progress Notes (Addendum)
Cardiology Office Note:    Date:  12/04/2020   ID:  [name], DOB [birth year], MRN [id number]  PCP:  Brandon [name], [facility]  Cardiologist:  Brandon Bergeron, MD  Advanced Practice Provider:  No care team member to display Electrophysiologist:  None   :[id number]}   Referring MD: Brandon Nutting, DO     History of Present Illness:    Brandon [name] is a 65 y.o. male with a hx of HTN, HLD, DMII, and CAD s/p recent PCI to RCA who presents to clinic for follow-up.  Patient was admitted to the hospital on 08/11/2020 with chest pain.  CTA of the chest showed no evidence of aortic dissection or aneurysm.  Respiratory panel negative for influenza and Covid.  D-dimer negative.  Troponin negative x2.  Echocardiogram obtained on 08/24/2020 showed EF 60 to 65%, no significant valve issue.  Given the atypical nature of his symptoms, he underwent coronary CT on 08/14/2020 which showed coronary calcium score of 1201 which placed the patient in 95th percentile for age and sex matched control, diffuse CAD with beadlike diabetic pattern with suspicion for severe stenosis in the mid RCA, ostial D2 and proximal left circumflex artery.  He subsequently underwent cardiac catheterization on 08/15/2020 which showed 90% mid RCA lesion, 75% proximal left circumflex artery, 75% OM 2, 100% OM1 with faint left to left and right-to-left collaterals, 70% RPDA, 75% D2, 30% distal LAD lesion.  The 90% mid RCA lesion was treated successfully with a 4.0 x 38 mm resolute DES.  The small disease in RPDA, OM1, OM2 at the diagonal vessel was managed medically. Postprocedure, he continued to have chest discomfort.  A bedside echocardiogram obtained on the diet of 08/15/2020 did not reveal any pericardial effusion, there was no change in LV function.  He was treated with colchicine 0.6 mg twice a day, colchicine was subsequently discontinued due to lack of recurrent symptoms.  Hemoglobin A1c  obtained during this admission was 10, he has been advised to follow-up with his primary care provider.  Was last seen by Brandon [name] on 08/30/20 where he was doing well. No changes in medication at that time.  Today, the patient is very concerned as he has noted worsening dyspnea on exertion and chest pressure. Specifically, he reports that he has been moving boxes up and down the stairs, and has developed worsening shortness of breath and chest tightness. When this occurs, he stops and his symptoms resolve within 10-15minutes of resting. He has no lightheadedness, dizziness, palptaitons at that time. He has had episode of lightheadedness when going from a squatting to standing position for which he has seen his PCP. He has adjusted how he bends over with improvement.  His blood pressure is notably elevated in the office today at 160. He states it has been running high at home 140-150s but appears to be well controlled at cardiac rehab. Fortunately, he has not had chest discomfort or significant SOB while at rehab or walking his dog. Has been missing evening doses of metop. He also complains of weight gain which he is concerned about today. He has not been active as he is scared about having chest pain or a recurrent heart attack with associated 30lbs weight gain. He has no PND, orthopnea. He reports having snoring at night and reports that his sleep CPAP machine was recalled prior to moving to [place]. He request for further evaluation for his OSA.   Filed  Cardiology Office Note:    Date:  12/04/2020   ID:  [name], DOB [birth year], MRN [id number]  PCP:  Brandon [name], [facility]  Cardiologist:  Brandon Bergeron, MD  Advanced Practice Provider:  No care team member to display Electrophysiologist:  None   :[id number]}   Referring MD: Brandon Nutting, DO     History of Present Illness:    Brandon [name] is a 65 y.o. male with a hx of HTN, HLD, DMII, and CAD s/p recent PCI to RCA who presents to clinic for follow-up.  Patient was admitted to the hospital on 08/11/2020 with chest pain.  CTA of the chest showed no evidence of aortic dissection or aneurysm.  Respiratory panel negative for influenza and Covid.  D-dimer negative.  Troponin negative x2.  Echocardiogram obtained on 08/24/2020 showed EF 60 to 65%, no significant valve issue.  Given the atypical nature of his symptoms, he underwent coronary CT on 08/14/2020 which showed coronary calcium score of 1201 which placed the patient in 95th percentile for age and sex matched control, diffuse CAD with beadlike diabetic pattern with suspicion for severe stenosis in the mid RCA, ostial D2 and proximal left circumflex artery.  He subsequently underwent cardiac catheterization on 08/15/2020 which showed 90% mid RCA lesion, 75% proximal left circumflex artery, 75% OM 2, 100% OM1 with faint left to left and right-to-left collaterals, 70% RPDA, 75% D2, 30% distal LAD lesion.  The 90% mid RCA lesion was treated successfully with a 4.0 x 38 mm resolute DES.  The small disease in RPDA, OM1, OM2 at the diagonal vessel was managed medically. Postprocedure, he continued to have chest discomfort.  A bedside echocardiogram obtained on the diet of 08/15/2020 did not reveal any pericardial effusion, there was no change in LV function.  He was treated with colchicine 0.6 mg twice a day, colchicine was subsequently discontinued due to lack of recurrent symptoms.  Hemoglobin A1c  obtained during this admission was 10, he has been advised to follow-up with his primary care provider.  Was last seen by Brandon [name] on 08/30/20 where he was doing well. No changes in medication at that time.  Today, the patient is very concerned as he has noted worsening dyspnea on exertion and chest pressure. Specifically, he reports that he has been moving boxes up and down the stairs, and has developed worsening shortness of breath and chest tightness. When this occurs, he stops and his symptoms resolve within 10-15minutes of resting. He has no lightheadedness, dizziness, palptaitons at that time. He has had episode of lightheadedness when going from a squatting to standing position for which he has seen his PCP. He has adjusted how he bends over with improvement.  His blood pressure is notably elevated in the office today at 160. He states it has been running high at home 140-150s but appears to be well controlled at cardiac rehab. Fortunately, he has not had chest discomfort or significant SOB while at rehab or walking his dog. Has been missing evening doses of metop. He also complains of weight gain which he is concerned about today. He has not been active as he is scared about having chest pain or a recurrent heart attack with associated 30lbs weight gain. He has no PND, orthopnea. He reports having snoring at night and reports that his sleep CPAP machine was recalled prior to moving to [place]. He request for further evaluation for his OSA.   Filed  Cardiology Office Note:    Date:  12/04/2020   ID:  [name], DOB [birth year], MRN [id number]  PCP:  Brandon [name], [facility]  Cardiologist:  Brandon Bergeron, MD  Advanced Practice Provider:  No care team member to display Electrophysiologist:  None   :[id number]}   Referring MD: Brandon Nutting, DO     History of Present Illness:    Brandon [name] is a 65 y.o. male with a hx of HTN, HLD, DMII, and CAD s/p recent PCI to RCA who presents to clinic for follow-up.  Patient was admitted to the hospital on 08/11/2020 with chest pain.  CTA of the chest showed no evidence of aortic dissection or aneurysm.  Respiratory panel negative for influenza and Covid.  D-dimer negative.  Troponin negative x2.  Echocardiogram obtained on 08/24/2020 showed EF 60 to 65%, no significant valve issue.  Given the atypical nature of his symptoms, he underwent coronary CT on 08/14/2020 which showed coronary calcium score of 1201 which placed the patient in 95th percentile for age and sex matched control, diffuse CAD with beadlike diabetic pattern with suspicion for severe stenosis in the mid RCA, ostial D2 and proximal left circumflex artery.  He subsequently underwent cardiac catheterization on 08/15/2020 which showed 90% mid RCA lesion, 75% proximal left circumflex artery, 75% OM 2, 100% OM1 with faint left to left and right-to-left collaterals, 70% RPDA, 75% D2, 30% distal LAD lesion.  The 90% mid RCA lesion was treated successfully with a 4.0 x 38 mm resolute DES.  The small disease in RPDA, OM1, OM2 at the diagonal vessel was managed medically. Postprocedure, he continued to have chest discomfort.  A bedside echocardiogram obtained on the diet of 08/15/2020 did not reveal any pericardial effusion, there was no change in LV function.  He was treated with colchicine 0.6 mg twice a day, colchicine was subsequently discontinued due to lack of recurrent symptoms.  Hemoglobin A1c  obtained during this admission was 10, he has been advised to follow-up with his primary care provider.  Was last seen by Brandon [name] on 08/30/20 where he was doing well. No changes in medication at that time.  Today, the patient is very concerned as he has noted worsening dyspnea on exertion and chest pressure. Specifically, he reports that he has been moving boxes up and down the stairs, and has developed worsening shortness of breath and chest tightness. When this occurs, he stops and his symptoms resolve within 10-15minutes of resting. He has no lightheadedness, dizziness, palptaitons at that time. He has had episode of lightheadedness when going from a squatting to standing position for which he has seen his PCP. He has adjusted how he bends over with improvement.  His blood pressure is notably elevated in the office today at 160. He states it has been running high at home 140-150s but appears to be well controlled at cardiac rehab. Fortunately, he has not had chest discomfort or significant SOB while at rehab or walking his dog. Has been missing evening doses of metop. He also complains of weight gain which he is concerned about today. He has not been active as he is scared about having chest pain or a recurrent heart attack with associated 30lbs weight gain. He has no PND, orthopnea. He reports having snoring at night and reports that his sleep CPAP machine was recalled prior to moving to [place]. He request for further evaluation for his OSA.   Filed  Cardiology Office Note:    Date:  12/04/2020   ID:  [name], DOB [birth year], MRN [id number]  PCP:  Brandon [name], [facility]  Cardiologist:  Brandon Bergeron, MD  Advanced Practice Provider:  No care team member to display Electrophysiologist:  None   :[id number]}   Referring MD: Brandon Nutting, DO     History of Present Illness:    Brandon [name] is a 65 y.o. male with a hx of HTN, HLD, DMII, and CAD s/p recent PCI to RCA who presents to clinic for follow-up.  Patient was admitted to the hospital on 08/11/2020 with chest pain.  CTA of the chest showed no evidence of aortic dissection or aneurysm.  Respiratory panel negative for influenza and Covid.  D-dimer negative.  Troponin negative x2.  Echocardiogram obtained on 08/24/2020 showed EF 60 to 65%, no significant valve issue.  Given the atypical nature of his symptoms, he underwent coronary CT on 08/14/2020 which showed coronary calcium score of 1201 which placed the patient in 95th percentile for age and sex matched control, diffuse CAD with beadlike diabetic pattern with suspicion for severe stenosis in the mid RCA, ostial D2 and proximal left circumflex artery.  He subsequently underwent cardiac catheterization on 08/15/2020 which showed 90% mid RCA lesion, 75% proximal left circumflex artery, 75% OM 2, 100% OM1 with faint left to left and right-to-left collaterals, 70% RPDA, 75% D2, 30% distal LAD lesion.  The 90% mid RCA lesion was treated successfully with a 4.0 x 38 mm resolute DES.  The small disease in RPDA, OM1, OM2 at the diagonal vessel was managed medically. Postprocedure, he continued to have chest discomfort.  A bedside echocardiogram obtained on the diet of 08/15/2020 did not reveal any pericardial effusion, there was no change in LV function.  He was treated with colchicine 0.6 mg twice a day, colchicine was subsequently discontinued due to lack of recurrent symptoms.  Hemoglobin A1c  obtained during this admission was 10, he has been advised to follow-up with his primary care provider.  Was last seen by Brandon [name] on 08/30/20 where he was doing well. No changes in medication at that time.  Today, the patient is very concerned as he has noted worsening dyspnea on exertion and chest pressure. Specifically, he reports that he has been moving boxes up and down the stairs, and has developed worsening shortness of breath and chest tightness. When this occurs, he stops and his symptoms resolve within 10-15minutes of resting. He has no lightheadedness, dizziness, palptaitons at that time. He has had episode of lightheadedness when going from a squatting to standing position for which he has seen his PCP. He has adjusted how he bends over with improvement.  His blood pressure is notably elevated in the office today at 160. He states it has been running high at home 140-150s but appears to be well controlled at cardiac rehab. Fortunately, he has not had chest discomfort or significant SOB while at rehab or walking his dog. Has been missing evening doses of metop. He also complains of weight gain which he is concerned about today. He has not been active as he is scared about having chest pain or a recurrent heart attack with associated 30lbs weight gain. He has no PND, orthopnea. He reports having snoring at night and reports that his sleep CPAP machine was recalled prior to moving to [place]. He request for further evaluation for his OSA.   Filed  Cardiology Office Note:    Date:  12/04/2020   ID:  [name], DOB [birth year], MRN [id number]  PCP:  Brandon [name], [facility]  Cardiologist:  Brandon Bergeron, MD  Advanced Practice Provider:  No care team member to display Electrophysiologist:  None   :[id number]}   Referring MD: Brandon Nutting, DO     History of Present Illness:    Brandon [name] is a 65 y.o. male with a hx of HTN, HLD, DMII, and CAD s/p recent PCI to RCA who presents to clinic for follow-up.  Patient was admitted to the hospital on 08/11/2020 with chest pain.  CTA of the chest showed no evidence of aortic dissection or aneurysm.  Respiratory panel negative for influenza and Covid.  D-dimer negative.  Troponin negative x2.  Echocardiogram obtained on 08/24/2020 showed EF 60 to 65%, no significant valve issue.  Given the atypical nature of his symptoms, he underwent coronary CT on 08/14/2020 which showed coronary calcium score of 1201 which placed the patient in 95th percentile for age and sex matched control, diffuse CAD with beadlike diabetic pattern with suspicion for severe stenosis in the mid RCA, ostial D2 and proximal left circumflex artery.  He subsequently underwent cardiac catheterization on 08/15/2020 which showed 90% mid RCA lesion, 75% proximal left circumflex artery, 75% OM 2, 100% OM1 with faint left to left and right-to-left collaterals, 70% RPDA, 75% D2, 30% distal LAD lesion.  The 90% mid RCA lesion was treated successfully with a 4.0 x 38 mm resolute DES.  The small disease in RPDA, OM1, OM2 at the diagonal vessel was managed medically. Postprocedure, he continued to have chest discomfort.  A bedside echocardiogram obtained on the diet of 08/15/2020 did not reveal any pericardial effusion, there was no change in LV function.  He was treated with colchicine 0.6 mg twice a day, colchicine was subsequently discontinued due to lack of recurrent symptoms.  Hemoglobin A1c  obtained during this admission was 10, he has been advised to follow-up with his primary care provider.  Was last seen by Brandon [name] on 08/30/20 where he was doing well. No changes in medication at that time.  Today, the patient is very concerned as he has noted worsening dyspnea on exertion and chest pressure. Specifically, he reports that he has been moving boxes up and down the stairs, and has developed worsening shortness of breath and chest tightness. When this occurs, he stops and his symptoms resolve within 10-15minutes of resting. He has no lightheadedness, dizziness, palptaitons at that time. He has had episode of lightheadedness when going from a squatting to standing position for which he has seen his PCP. He has adjusted how he bends over with improvement.  His blood pressure is notably elevated in the office today at 160. He states it has been running high at home 140-150s but appears to be well controlled at cardiac rehab. Fortunately, he has not had chest discomfort or significant SOB while at rehab or walking his dog. Has been missing evening doses of metop. He also complains of weight gain which he is concerned about today. He has not been active as he is scared about having chest pain or a recurrent heart attack with associated 30lbs weight gain. He has no PND, orthopnea. He reports having snoring at night and reports that his sleep CPAP machine was recalled prior to moving to [place]. He request for further evaluation for his OSA.   Filed  Cardiology Office Note:    Date:  12/04/2020   ID:  [name], DOB [birth year], MRN [id number]  PCP:  Brandon [name], [facility]  Cardiologist:  Brandon Bergeron, MD  Advanced Practice Provider:  No care team member to display Electrophysiologist:  None   :[id number]}   Referring MD: Brandon Nutting, DO     History of Present Illness:    Brandon [name] is a 65 y.o. male with a hx of HTN, HLD, DMII, and CAD s/p recent PCI to RCA who presents to clinic for follow-up.  Patient was admitted to the hospital on 08/11/2020 with chest pain.  CTA of the chest showed no evidence of aortic dissection or aneurysm.  Respiratory panel negative for influenza and Covid.  D-dimer negative.  Troponin negative x2.  Echocardiogram obtained on 08/24/2020 showed EF 60 to 65%, no significant valve issue.  Given the atypical nature of his symptoms, he underwent coronary CT on 08/14/2020 which showed coronary calcium score of 1201 which placed the patient in 95th percentile for age and sex matched control, diffuse CAD with beadlike diabetic pattern with suspicion for severe stenosis in the mid RCA, ostial D2 and proximal left circumflex artery.  He subsequently underwent cardiac catheterization on 08/15/2020 which showed 90% mid RCA lesion, 75% proximal left circumflex artery, 75% OM 2, 100% OM1 with faint left to left and right-to-left collaterals, 70% RPDA, 75% D2, 30% distal LAD lesion.  The 90% mid RCA lesion was treated successfully with a 4.0 x 38 mm resolute DES.  The small disease in RPDA, OM1, OM2 at the diagonal vessel was managed medically. Postprocedure, he continued to have chest discomfort.  A bedside echocardiogram obtained on the diet of 08/15/2020 did not reveal any pericardial effusion, there was no change in LV function.  He was treated with colchicine 0.6 mg twice a day, colchicine was subsequently discontinued due to lack of recurrent symptoms.  Hemoglobin A1c  obtained during this admission was 10, he has been advised to follow-up with his primary care provider.  Was last seen by Brandon [name] on 08/30/20 where he was doing well. No changes in medication at that time.  Today, the patient is very concerned as he has noted worsening dyspnea on exertion and chest pressure. Specifically, he reports that he has been moving boxes up and down the stairs, and has developed worsening shortness of breath and chest tightness. When this occurs, he stops and his symptoms resolve within 10-15minutes of resting. He has no lightheadedness, dizziness, palptaitons at that time. He has had episode of lightheadedness when going from a squatting to standing position for which he has seen his PCP. He has adjusted how he bends over with improvement.  His blood pressure is notably elevated in the office today at 160. He states it has been running high at home 140-150s but appears to be well controlled at cardiac rehab. Fortunately, he has not had chest discomfort or significant SOB while at rehab or walking his dog. Has been missing evening doses of metop. He also complains of weight gain which he is concerned about today. He has not been active as he is scared about having chest pain or a recurrent heart attack with associated 30lbs weight gain. He has no PND, orthopnea. He reports having snoring at night and reports that his sleep CPAP machine was recalled prior to moving to [place]. He request for further evaluation for his OSA.   Filed  Cardiology Office Note:    Date:  12/04/2020   ID:  [name], DOB [birth year], MRN [id number]  PCP:  Brandon [name], [facility]  Cardiologist:  Brandon Bergeron, MD  Advanced Practice Provider:  No care team member to display Electrophysiologist:  None   :[id number]}   Referring MD: Brandon Nutting, DO     History of Present Illness:    Brandon [name] is a 65 y.o. male with a hx of HTN, HLD, DMII, and CAD s/p recent PCI to RCA who presents to clinic for follow-up.  Patient was admitted to the hospital on 08/11/2020 with chest pain.  CTA of the chest showed no evidence of aortic dissection or aneurysm.  Respiratory panel negative for influenza and Covid.  D-dimer negative.  Troponin negative x2.  Echocardiogram obtained on 08/24/2020 showed EF 60 to 65%, no significant valve issue.  Given the atypical nature of his symptoms, he underwent coronary CT on 08/14/2020 which showed coronary calcium score of 1201 which placed the patient in 95th percentile for age and sex matched control, diffuse CAD with beadlike diabetic pattern with suspicion for severe stenosis in the mid RCA, ostial D2 and proximal left circumflex artery.  He subsequently underwent cardiac catheterization on 08/15/2020 which showed 90% mid RCA lesion, 75% proximal left circumflex artery, 75% OM 2, 100% OM1 with faint left to left and right-to-left collaterals, 70% RPDA, 75% D2, 30% distal LAD lesion.  The 90% mid RCA lesion was treated successfully with a 4.0 x 38 mm resolute DES.  The small disease in RPDA, OM1, OM2 at the diagonal vessel was managed medically. Postprocedure, he continued to have chest discomfort.  A bedside echocardiogram obtained on the diet of 08/15/2020 did not reveal any pericardial effusion, there was no change in LV function.  He was treated with colchicine 0.6 mg twice a day, colchicine was subsequently discontinued due to lack of recurrent symptoms.  Hemoglobin A1c  obtained during this admission was 10, he has been advised to follow-up with his primary care provider.  Was last seen by Brandon [name] on 08/30/20 where he was doing well. No changes in medication at that time.  Today, the patient is very concerned as he has noted worsening dyspnea on exertion and chest pressure. Specifically, he reports that he has been moving boxes up and down the stairs, and has developed worsening shortness of breath and chest tightness. When this occurs, he stops and his symptoms resolve within 10-15minutes of resting. He has no lightheadedness, dizziness, palptaitons at that time. He has had episode of lightheadedness when going from a squatting to standing position for which he has seen his PCP. He has adjusted how he bends over with improvement.  His blood pressure is notably elevated in the office today at 160. He states it has been running high at home 140-150s but appears to be well controlled at cardiac rehab. Fortunately, he has not had chest discomfort or significant SOB while at rehab or walking his dog. Has been missing evening doses of metop. He also complains of weight gain which he is concerned about today. He has not been active as he is scared about having chest pain or a recurrent heart attack with associated 30lbs weight gain. He has no PND, orthopnea. He reports having snoring at night and reports that his sleep CPAP machine was recalled prior to moving to [place]. He request for further evaluation for his OSA.   Filed  Cardiology Office Note:    Date:  12/04/2020   ID:  [name], DOB [birth year], MRN [id number]  PCP:  Brandon [name], [facility]  Cardiologist:  Brandon Bergeron, MD  Advanced Practice Provider:  No care team member to display Electrophysiologist:  None   :[id number]}   Referring MD: Brandon Nutting, DO     History of Present Illness:    Brandon [name] is a 65 y.o. male with a hx of HTN, HLD, DMII, and CAD s/p recent PCI to RCA who presents to clinic for follow-up.  Patient was admitted to the hospital on 08/11/2020 with chest pain.  CTA of the chest showed no evidence of aortic dissection or aneurysm.  Respiratory panel negative for influenza and Covid.  D-dimer negative.  Troponin negative x2.  Echocardiogram obtained on 08/24/2020 showed EF 60 to 65%, no significant valve issue.  Given the atypical nature of his symptoms, he underwent coronary CT on 08/14/2020 which showed coronary calcium score of 1201 which placed the patient in 95th percentile for age and sex matched control, diffuse CAD with beadlike diabetic pattern with suspicion for severe stenosis in the mid RCA, ostial D2 and proximal left circumflex artery.  He subsequently underwent cardiac catheterization on 08/15/2020 which showed 90% mid RCA lesion, 75% proximal left circumflex artery, 75% OM 2, 100% OM1 with faint left to left and right-to-left collaterals, 70% RPDA, 75% D2, 30% distal LAD lesion.  The 90% mid RCA lesion was treated successfully with a 4.0 x 38 mm resolute DES.  The small disease in RPDA, OM1, OM2 at the diagonal vessel was managed medically. Postprocedure, he continued to have chest discomfort.  A bedside echocardiogram obtained on the diet of 08/15/2020 did not reveal any pericardial effusion, there was no change in LV function.  He was treated with colchicine 0.6 mg twice a day, colchicine was subsequently discontinued due to lack of recurrent symptoms.  Hemoglobin A1c  obtained during this admission was 10, he has been advised to follow-up with his primary care provider.  Was last seen by Brandon [name] on 08/30/20 where he was doing well. No changes in medication at that time.  Today, the patient is very concerned as he has noted worsening dyspnea on exertion and chest pressure. Specifically, he reports that he has been moving boxes up and down the stairs, and has developed worsening shortness of breath and chest tightness. When this occurs, he stops and his symptoms resolve within 10-15minutes of resting. He has no lightheadedness, dizziness, palptaitons at that time. He has had episode of lightheadedness when going from a squatting to standing position for which he has seen his PCP. He has adjusted how he bends over with improvement.  His blood pressure is notably elevated in the office today at 160. He states it has been running high at home 140-150s but appears to be well controlled at cardiac rehab. Fortunately, he has not had chest discomfort or significant SOB while at rehab or walking his dog. Has been missing evening doses of metop. He also complains of weight gain which he is concerned about today. He has not been active as he is scared about having chest pain or a recurrent heart attack with associated 30lbs weight gain. He has no PND, orthopnea. He reports having snoring at night and reports that his sleep CPAP machine was recalled prior to moving to [place]. He request for further evaluation for his OSA.   Filed

## 2020-12-04 NOTE — Patient Instructions (Signed)
Medication Instructions:   STOP TAKING METOPROLOL TARTRATE NOW  START TAKING METOPROLOL SUCCINATE (TOPROL XL) 50 MG BY MOUTH DAILY  START TAKING ISOSORBIDE MONONITRATE (IMDUR) 30 MG BY MOUTH DAILY  START TAKING LOSARTAN 25 MG BY MOUTH DAILY--TAKE ONLY WHEN SYSTOLIC BP IS GREATER THAN 110.  *If you need a refill on your cardiac medications before your next appointment, please call your pharmacy*   Testing/Procedures:  Your physician has recommended that you have a SPLIT NIGHT sleep study. This test records several body functions during sleep, including: brain activity, eye movement, oxygen and carbon dioxide blood levels, heart rate and rhythm, breathing rate and rhythm, the flow of air through your mouth and nose, snoring, body muscle movements, and chest and belly movement.  THIS IS TO ESTABLISH WITH DR. Mayford Knife AND REPLACE YOUR RECALLED CPAP   Follow-Up:  1 MONTH IN THE OFFICE WITH DR. Shari Prows OR AN EXTENDER    Other Instructions   Mediterranean Diet A Mediterranean diet refers to food and lifestyle choices that are based on the traditions of countries located on the [employer]. This way of eating has been shown to help prevent certain conditions and improve outcomes for people who have chronic diseases, like kidney disease and heart disease. What are tips for following this plan? Lifestyle  Cook and eat meals together with your family, when possible.  Drink enough fluid to keep your urine clear or pale yellow.  Be physically active every day. This includes: ? Aerobic exercise like running or swimming. ? Leisure activities like gardening, walking, or housework.  Get 7-8 hours of sleep each night.  If recommended by your health care provider, drink red wine in moderation. This means 1 glass a day for nonpregnant women and 2 glasses a day for men. A glass of wine equals 5 oz (150 mL). Reading food labels  Check the serving size of packaged foods. For foods such as  rice and pasta, the serving size refers to the amount of cooked product, not dry.  Check the total fat in packaged foods. Avoid foods that have saturated fat or trans fats.  Check the ingredients list for added sugars, such as corn syrup.   Shopping  At the grocery store, buy most of your food from the areas near the walls of the store. This includes: ? Fresh fruits and vegetables (produce). ? Grains, beans, nuts, and seeds. Some of these may be available in unpackaged forms or large amounts (in bulk). ? Fresh seafood. ? Poultry and eggs. ? Low-fat dairy products.  Buy whole ingredients instead of prepackaged foods.  Buy fresh fruits and vegetables in-season from local farmers markets.  Buy frozen fruits and vegetables in resealable bags.  If you do not have access to quality fresh seafood, buy precooked frozen shrimp or canned fish, such as tuna, salmon, or sardines.  Buy small amounts of raw or cooked vegetables, salads, or olives from the deli or salad bar at your store.  Stock your pantry so you always have certain foods on hand, such as olive oil, canned tuna, canned tomatoes, rice, pasta, and beans. Cooking  Cook foods with extra-virgin olive oil instead of using butter or other vegetable oils.  Have meat as a side dish, and have vegetables or grains as your main dish. This means having meat in small portions or adding small amounts of meat to foods like pasta or stew.  Use beans or vegetables instead of meat in common dishes like chili or lasagna.  Experiment  with different cooking methods. Try roasting or broiling vegetables instead of steaming or sauteing them.  Add frozen vegetables to soups, stews, pasta, or rice.  Add nuts or seeds for added healthy fat at each meal. You can add these to yogurt, salads, or vegetable dishes.  Marinate fish or vegetables using olive oil, lemon juice, garlic, and fresh herbs. Meal planning  Plan to eat 1 vegetarian meal one day  each week. Try to work up to 2 vegetarian meals, if possible.  Eat seafood 2 or more times a week.  Have healthy snacks readily available, such as: ? Vegetable sticks with hummus. ? [place] yogurt. ? Fruit and nut trail mix.  Eat balanced meals throughout the week. This includes: ? Fruit: 2-3 servings a day ? Vegetables: 4-5 servings a day ? Low-fat dairy: 2 servings a day ? Fish, poultry, or lean meat: 1 serving a day ? Beans and legumes: 2 or more servings a week ? Nuts and seeds: 1-2 servings a day ? Whole grains: 6-8 servings a day ? Extra-virgin olive oil: 3-4 servings a day  Limit red meat and sweets to only a few servings a month   What are my food choices?  Mediterranean diet ? Recommended  Grains: Whole-grain pasta. Brown rice. Bulgar wheat. Polenta. Couscous. Whole-wheat bread. Orpah Cobb.  Vegetables: Artichokes. Beets. Broccoli. Cabbage. Carrots. Eggplant. Green beans. Chard. Kale. Spinach. Onions. Leeks. Peas. Squash. Tomatoes. Peppers. Radishes.  Fruits: Apples. Apricots. Avocado. Berries. Bananas. Cherries. Dates. Figs. Grapes. Lemons. Melon. Oranges. Peaches. Plums. Pomegranate.  Meats and other protein foods: Beans. Almonds. Sunflower seeds. Pine nuts. Peanuts. Cod. Salmon. Scallops. Shrimp. Tuna. Tilapia. Clams. Oysters. Eggs.  Dairy: Low-fat milk. Cheese. Greek yogurt.  Beverages: Water. Red wine. Herbal tea.  Fats and oils: Extra virgin olive oil. Avocado oil. Grape seed oil.  Sweets and desserts: [place] yogurt with honey. Baked apples. Poached pears. Trail mix.  Seasoning and other foods: Basil. Cilantro. Coriander. Cumin. Mint. Parsley. Sage. Rosemary. Tarragon. Garlic. Oregano. Thyme. Pepper. Balsalmic vinegar. Tahini. Hummus. Tomato sauce. Olives. Mushrooms. ? Limit these  Grains: Prepackaged pasta or rice dishes. Prepackaged cereal with added sugar.  Vegetables: Deep fried potatoes (french fries).  Fruits: Fruit canned in syrup.  Meats and  other protein foods: Beef. Pork. Lamb. Poultry with skin. Hot dogs. Tomasa Blase.  Dairy: Ice cream. Sour cream. Whole milk.  Beverages: Juice. Sugar-sweetened soft drinks. Beer. Liquor and spirits.  Fats and oils: Butter. Canola oil. Vegetable oil. Beef fat (tallow). Lard.  Sweets and desserts: Cookies. Cakes. Pies. Candy.  Seasoning and other foods: Mayonnaise. Premade sauces and marinades. The items listed may not be a complete list. Talk with your dietitian about what dietary choices are right for you. Summary  The Mediterranean diet includes both food and lifestyle choices.  Eat a variety of fresh fruits and vegetables, beans, nuts, seeds, and whole grains.  Limit the amount of red meat and sweets that you eat.  Talk with your health care provider about whether it is safe for you to drink red wine in moderation. This means 1 glass a day for nonpregnant women and 2 glasses a day for men. A glass of wine equals 5 oz (150 mL). This information is not intended to replace advice given to you by your health care provider. Make sure you discuss any questions you have with your health care provider. Document Revised: 04/11/2016 Document Reviewed: 04/04/2016 Elsevier Patient Education  2020 [employer].

## 2020-12-04 NOTE — Assessment & Plan Note (Signed)
Continue ASA, plavix 75

## 2020-12-05 ENCOUNTER — Other Ambulatory Visit (HOSPITAL_COMMUNITY): Payer: Self-pay

## 2020-12-05 ENCOUNTER — Encounter: Payer: Self-pay | Admitting: Family Medicine

## 2020-12-05 ENCOUNTER — Ambulatory Visit: Payer: No Typology Code available for payment source | Admitting: Rehabilitative and Restorative Service Providers"

## 2020-12-05 ENCOUNTER — Encounter: Payer: Self-pay | Admitting: Endocrinology

## 2020-12-05 ENCOUNTER — Ambulatory Visit (INDEPENDENT_AMBULATORY_CARE_PROVIDER_SITE_OTHER): Payer: No Typology Code available for payment source | Admitting: Family Medicine

## 2020-12-05 ENCOUNTER — Telehealth: Payer: Self-pay

## 2020-12-05 DIAGNOSIS — R42 Dizziness and giddiness: Secondary | ICD-10-CM

## 2020-12-05 NOTE — Assessment & Plan Note (Signed)
Symptoms remain consistent with vertigo.  Recommend vestibular rehab, he will stop by PT and reschedule today.  He'll follow back up with me if symptoms persist after vestibular rehab.

## 2020-12-05 NOTE — Progress Notes (Signed)
Brandon [name] - 65 y.o. male MRN [id number]  Date of birth: 1956-07-25  Subjective Chief Complaint  Patient presents with  . Fall    HPI Brandon [name] is a 65 y.o. male here today with complaint of dizziness.  He has had recurrent episodes of dizziness.  Worse with standing or turning his head too quickly.  He did have a fall after squatting down and standing too quickly.  He did see Dr. Benjamin Stain recently who thought he had vertigo and referred him for vestibular rehab.  He unfortunately missed his appt with PT earlier today.  He did have evaluation by his cardiology and cardiac etiology for his dizziness thought to be unlikely.  He denies hypoglycemia during these episodes.    ROS:  A comprehensive ROS was completed and negative except as noted per HPI  Allergies  Allergen Reactions  . Kiwi Extract Swelling    Mouth swelling.   . Other Swelling    EGGPLANT. Mouth swelling.  Marland Kitchen Penicillins Rash  . Ancef [Cefazolin] Rash  . Latex Rash  . Levaquin [Levofloxacin] Rash    Past Medical History:  Diagnosis Date  . Anxiety   . Basal cell carcinoma (BCC) in situ of skin   . Colon polyps   . Coronary artery disease   . Depression   . Diabetes mellitus without complication (HCC)   . Hypertension   . SBO (small bowel obstruction) (HCC)     Past Surgical History:  Procedure Laterality Date  . BACK SURGERY    . CARDIAC CATHETERIZATION    . CATARACT EXTRACTION W/ INTRAOCULAR LENS  IMPLANT, BILATERAL    . CHOLECYSTECTOMY    . CHOLECYSTECTOMY, LAPAROSCOPIC    . CORONARY STENT INTERVENTION N/A 08/15/2020   Procedure: CORONARY STENT INTERVENTION;  Surgeon: Corky Crafts, MD;  Location: [facility] INVASIVE CV LAB;  Service: Cardiovascular;  Laterality: N/A;  . EYE SURGERY    . INTRAVASCULAR ULTRASOUND/IVUS N/A 08/15/2020   Procedure: Intravascular Ultrasound/IVUS;  Surgeon: Corky Crafts, MD;  Location: [facility] INVASIVE CV LAB;  Service: Cardiovascular;  Laterality: N/A;  . LEFT  HEART CATH AND CORONARY ANGIOGRAPHY N/A 08/15/2020   Procedure: LEFT HEART CATH AND CORONARY ANGIOGRAPHY;  Surgeon: Corky Crafts, MD;  Location: [facility] INVASIVE CV LAB;  Service: Cardiovascular;  Laterality: N/A;  . LUMBAR FUSION     L3-5 with rods  . XI ROBOTIC ASSISTED INGUINAL HERNIA REPAIR WITH MESH     x 3 surgeries    Social History   Socioeconomic History  . Marital status: Married    Spouse name: Not on file  . Number of children: 4  . Years of education: 45  . Highest education level: Not on file  Occupational History  . Occupation: disabled  Tobacco Use  . Smoking status: Former Smoker    Quit date: 2007    Years since quitting: 15.2  . Smokeless tobacco: Never Used  Vaping Use  . Vaping Use: Not on file  Substance and Sexual Activity  . Alcohol use: Not Currently  . Drug use: Not Currently  . Sexual activity: Yes  Other Topics Concern  . Not on file  Social History Narrative  . Not on file   Social Determinants of Health   Financial Resource Strain: Not on file  Food Insecurity: Not on file  Transportation Needs: Not on file  Physical Activity: Not on file  Stress: Not on file  Social Connections: Not on file    Family History  Problem Relation Age of Onset  . COPD Mother   . Anxiety disorder Mother   . Depression Mother   . Cancer Father   . Anxiety disorder Father   . Depression Father   . Other Father   . Diabetes Neg Hx     Health Maintenance  Topic Date Due  . Hepatitis C Screening  Never done  . OPHTHALMOLOGY EXAM  Never done  . TETANUS/TDAP  Never done  . COLONOSCOPY (Pts 45-63yrs Insurance coverage will need to be confirmed)  Never done  . PNA vac Low Risk Adult (1 of 2 - PCV13) 09/13/2020  . COVID-19 Vaccine (3 - Booster for Pfizer series) 12/15/2020  . INFLUENZA VACCINE  03/26/2021  . HEMOGLOBIN A1C  05/18/2021  . FOOT EXAM  11/15/2021  . HIV Screening  Completed  . HPV VACCINES  Aged Out      ----------------------------------------------------------------------------------------------------------------------------------------------------------------------------------------------------------------- Physical Exam BP 123/63 (BP Location: Left Arm, Patient Position: Sitting, Cuff Size: Large)   Pulse 81   Temp 98 F (36.7 C)   Wt 242 lb 3.2 oz (109.9 kg)   SpO2 95%   BMI 35.77 kg/m   Physical Exam Constitutional:      Appearance: Normal appearance.  HENT:     Head: Normocephalic and atraumatic.  Eyes:     General: No scleral icterus. Cardiovascular:     Rate and Rhythm: Normal rate and regular rhythm.  Pulmonary:     Effort: Pulmonary effort is normal.     Breath sounds: Normal breath sounds.  Musculoskeletal:     Cervical back: Neck supple.  Skin:    General: Skin is warm and dry.  Neurological:     General: No focal deficit present.     Mental Status: He is alert.  Psychiatric:        Mood and Affect: Mood normal.        Behavior: Behavior normal.     ------------------------------------------------------------------------------------------------------------------------------------------------------------------------------------------------------------------- Assessment and Plan  Vertigo Symptoms remain consistent with vertigo.  Recommend vestibular rehab, he will stop by PT and reschedule today.  He'll follow back up with me if symptoms persist after vestibular rehab.     No orders of the defined types were placed in this encounter.   Return in about 3 months (around 03/06/2021) for HTN.    This visit occurred during the SARS-CoV-2 public health emergency.  Safety protocols were in place, including screening questions prior to the visit, additional usage of staff PPE, and extensive cleaning of exam room while observing appropriate contact time as indicated for disinfecting solutions.

## 2020-12-05 NOTE — Telephone Encounter (Signed)
Elenore Rota PA Key: Darral Dash has been approved for Dexcom G6 Sensor from 12/01/2020 to 11/30/2021

## 2020-12-06 ENCOUNTER — Encounter (HOSPITAL_COMMUNITY): Payer: No Typology Code available for payment source

## 2020-12-06 ENCOUNTER — Telehealth: Payer: Self-pay

## 2020-12-06 ENCOUNTER — Other Ambulatory Visit (HOSPITAL_COMMUNITY): Payer: Self-pay

## 2020-12-06 NOTE — Telephone Encounter (Signed)
Pt has been made aware that his PA for Dexcom sensor has been approved.

## 2020-12-07 ENCOUNTER — Other Ambulatory Visit (HOSPITAL_COMMUNITY): Payer: Self-pay

## 2020-12-08 ENCOUNTER — Encounter (HOSPITAL_COMMUNITY)
Admission: RE | Admit: 2020-12-08 | Discharge: 2020-12-08 | Disposition: A | Discharge status: Home / Self Care | Payer: No Typology Code available for payment source | Source: Ambulatory Visit | Attending: Cardiology | Admitting: Cardiology

## 2020-12-08 ENCOUNTER — Other Ambulatory Visit (HOSPITAL_COMMUNITY): Payer: Self-pay

## 2020-12-08 ENCOUNTER — Other Ambulatory Visit: Payer: Self-pay

## 2020-12-08 DIAGNOSIS — Z955 Presence of coronary angioplasty implant and graft: Secondary | ICD-10-CM | POA: Diagnosis not present

## 2020-12-08 NOTE — Progress Notes (Signed)
Reviewed home exercise guidelines with patient including endpoints, temperature precautions, target heart rate and rate of perceived exertion. Patient plans to walk 30 minutes 4-7 days/week as his mode of home exercise. Patient voices understanding of instructions given.  Artist Pais, MS, ACSM CEP

## 2020-12-08 NOTE — Progress Notes (Signed)
Zebulan returned to exercise today. No complaints of chest pain or dizziness. Medication changes noted per Dr Shari Prows.Will continue to monitor the patient throughout  the program.Oaklyn Mans Harlon Flor, RN,BSN 12/08/2020 12:11 PM

## 2020-12-09 MED FILL — Insulin Degludec Soln Pen-Injector 100 Unit/ML: SUBCUTANEOUS | 30 days supply | Qty: 18 | Fill #0 | Status: AC

## 2020-12-11 ENCOUNTER — Other Ambulatory Visit: Payer: Self-pay

## 2020-12-11 ENCOUNTER — Encounter (HOSPITAL_COMMUNITY)
Admission: RE | Admit: 2020-12-11 | Discharge: 2020-12-11 | Disposition: A | Discharge status: Home / Self Care | Payer: No Typology Code available for payment source | Source: Ambulatory Visit | Attending: Cardiology | Admitting: Cardiology

## 2020-12-11 ENCOUNTER — Other Ambulatory Visit (HOSPITAL_COMMUNITY): Payer: Self-pay

## 2020-12-11 DIAGNOSIS — Z955 Presence of coronary angioplasty implant and graft: Secondary | ICD-10-CM | POA: Diagnosis not present

## 2020-12-11 MED FILL — Clopidogrel Bisulfate Tab 75 MG (Base Equiv): ORAL | 90 days supply | Qty: 90 | Fill #0 | Status: AC

## 2020-12-11 MED FILL — Atorvastatin Calcium Tab 80 MG (Base Equivalent): ORAL | 90 days supply | Qty: 90 | Fill #0 | Status: AC

## 2020-12-11 NOTE — Progress Notes (Addendum)
Brandon [name] reported feeling lightheaded after getting off the nutep at phase 2 cardiac rehab.Telemetry rhythm Sinus Rhythm 82. Blood pressure sitting 114/64. Standing blood pressure 122/60. Patient's CBG 124 by his CGM. Brandon [name] was given water and ginger ale. [name] reported still feeling lightheaded after drinking water. Repeat blood pressure 118/62. Medications reviewed. Brandon [name] says that he has been feeling lightheaded ever since Dr Shari Prows added the Imdur and losartan during his last office visit at times when changing positions.Brandon [name] Barrett PAC paged and notified. Brandon [name] instructed the patient to make sure that he is drinking enough water  throughout  The day. Patient states understanding.. No symptoms upon exit from phase 2 cardiac rehab except a slight headache.Will continue to monitor the patient throughout  the program.Brandon Reta Harlon Flor, RN,BSN 12/11/2020 12:11 PM

## 2020-12-12 ENCOUNTER — Ambulatory Visit: Payer: No Typology Code available for payment source | Admitting: Sports Medicine

## 2020-12-12 ENCOUNTER — Ambulatory Visit (INDEPENDENT_AMBULATORY_CARE_PROVIDER_SITE_OTHER): Payer: No Typology Code available for payment source | Admitting: Rehabilitative and Restorative Service Providers"

## 2020-12-12 ENCOUNTER — Other Ambulatory Visit: Payer: Self-pay

## 2020-12-12 DIAGNOSIS — M6281 Muscle weakness (generalized): Secondary | ICD-10-CM

## 2020-12-12 DIAGNOSIS — M545 Low back pain, unspecified: Secondary | ICD-10-CM | POA: Diagnosis not present

## 2020-12-12 DIAGNOSIS — R2689 Other abnormalities of gait and mobility: Secondary | ICD-10-CM

## 2020-12-12 DIAGNOSIS — E291 Testicular hypofunction: Secondary | ICD-10-CM

## 2020-12-12 NOTE — Therapy (Signed)
[facility] [place] [address] [place], [place], [address] Phone: [phone]   Fax:  [phone]  Physical Therapy Evaluation  Patient Details  Name: Brandon [name] MRN: [id number] Date of Birth: [birth year] Referring Provider (PT): Rodney Langton, MD   Encounter Date: 12/12/2020   PT End of Session - 12/12/20 1343    Visit Number 1    Number of Visits 12    Date for PT Re-Evaluation 01/23/21    Authorization Type UMR    PT Start Time 0851    PT Stop Time 0933    PT Time Calculation (min) 42 min    Activity Tolerance Patient tolerated treatment well    Behavior During Therapy [facility] for tasks assessed/performed           Past Medical History:  Diagnosis Date  . Anxiety   . Basal cell carcinoma (BCC) in situ of skin   . Colon polyps   . Coronary artery disease   . Depression   . Diabetes mellitus without complication (HCC)   . Hypertension   . SBO (small bowel obstruction) (HCC)     Past Surgical History:  Procedure Laterality Date  . BACK SURGERY    . CARDIAC CATHETERIZATION    . CATARACT EXTRACTION W/ INTRAOCULAR LENS  IMPLANT, BILATERAL    . CHOLECYSTECTOMY    . CHOLECYSTECTOMY, LAPAROSCOPIC    . CORONARY STENT INTERVENTION N/A 08/15/2020   Procedure: CORONARY STENT INTERVENTION;  Surgeon: Corky Crafts, MD;  Location: [facility] INVASIVE CV LAB;  Service: Cardiovascular;  Laterality: N/A;  . EYE SURGERY    . INTRAVASCULAR ULTRASOUND/IVUS N/A 08/15/2020   Procedure: Intravascular Ultrasound/IVUS;  Surgeon: Corky Crafts, MD;  Location: [facility] INVASIVE CV LAB;  Service: Cardiovascular;  Laterality: N/A;  . LEFT HEART CATH AND CORONARY ANGIOGRAPHY N/A 08/15/2020   Procedure: LEFT HEART CATH AND CORONARY ANGIOGRAPHY;  Surgeon: Corky Crafts, MD;  Location: [facility] INVASIVE CV LAB;  Service: Cardiovascular;  Laterality: N/A;  . LUMBAR FUSION     L3-5 with rods  . XI ROBOTIC ASSISTED INGUINAL HERNIA REPAIR  WITH MESH     x 3 surgeries    There were no vitals filed for this visit.    Subjective Assessment - 12/12/20 0852    Subjective The patient reports he had a recent fall in which he could not get up.  When he kneels down and stands up, he gets unsteady.  He denies spinning sensations with bed mobility.  He began a new cardiac medication and gets lightheaded after taking.  He is managing meds-- cardiac rehab called physician office yesterday.  He gets darkness in his peripheral vision "it goes blank" if he kneels and gets up or stands up quickly.  He also gets nausea that settles within minutes.  He gets some unsteadiness when he is in a tight spot where he has to watch where he is stepping, or after rising quickly.    Pertinent History diabetes, HTN, h/o low back pain since 2006 (fell in the shower)-- has spinal cord stimulator;  coronary artery disease    Patient Stated Goals to be able to kneel and rise.    Currently in Pain? Yes    Pain Location Back    Aggravating Factors  goes up to 6-8/10    Pain Relieving Factors pain meds              [facility] PT Assessment - 12/12/20 0901      Assessment  Medical Diagnosis vertigo  "Vestibular rehabilitation for vertigo as well as gait training and low back strengthening for lumbar spondylosis."    Referring Provider (PT) Rodney Langton, MD    Onset Date/Surgical Date 11/28/20      Precautions   Precautions Fall      Balance Screen   Has the patient fallen in the past 6 months Yes    How many times? 3-- all outside with the dogs after bending and returning to stand up    Has the patient had a decrease in activity level because of a fear of falling?  Yes    Is the patient reluctant to leave their home because of a fear of falling?  No      Home [employer] residence    Living Arrangements Spouse/significant other;Children    Type of Home Apartment    Home Access Stairs to enter    Home Equipment [place] -  single point   just began using to walk the dog     Prior Function   Level of Independence Independent    Vocation Retired    Leisure walking the dogs; currently unpacking from recent move      Sensation   Light Touch Impaired Detail    Additional Comments gets numbness in fingers when he turns his head; can get numbness in hands after sleeping      Posture/Postural Control   Posture/Postural Control Postural limitations    Postural Limitations Rounded Shoulders      ROM / Strength   AROM / PROM / Strength AROM;Strength      AROM   Overall AROM  Deficits    Overall AROM Comments h/o spinal fusion 2007    AROM Assessment Site Lumbar    Lumbar Flexion 75% limitation    Lumbar - Right Side Bend 75% limitation    Lumbar - Left Side Bend 75% limitation      Strength   Overall Strength Deficits    Strength Assessment Site Hip;Knee;Ankle    Right/Left Hip Right;Left    Right Hip Flexion 3/5    Left Hip Flexion 3/5    Right/Left Knee Right;Left    Right Knee Flexion 5/5    Right Knee Extension 5/5   tightness in HS noted   Left Knee Flexion 4/5    Left Knee Extension 4/5    Right/Left Ankle Right;Left    Right Ankle Dorsiflexion 4/5    Left Ankle Dorsiflexion 4/5      Flexibility   Soft Tissue Assessment /Muscle Length yes    Hamstrings -30 L hamstring, -40 R hamstring (90/90 starting position in supine extending leg)      Special Tests    Special Tests Lumbar    Lumbar Tests FABER test;Straight Leg Raise      FABER test   Comment *dec'd ROM and pain in lumbar spine for R and L sides      Straight Leg Raise   Findings Positive    Side  Right    Comment pain in back and down leg                  Vestibular Assessment - 12/12/20 0918      Vestibular Assessment   General Observation Patient denies a true spinning sensation and reports a lightheadedness sensation in which he gets a blackening of his peripheral vision.  This has led to a fall while picking up  after his dogs in  which he could not get up.      Symptom Behavior   Subjective history of current problem Onset of sympotms in last 3 weeks    Type of Dizziness  Lightheadedness;Imbalance    Frequency of Dizziness daily    Duration of Dizziness <1 minute    Symptom Nature Positional    Aggravating Factors Sit to stand;Supine to sit      Oculomotor Exam   Oculomotor Alignment Normal    Ocular ROM WNLs    Spontaneous Absent    Gaze-induced  Absent    Smooth Pursuits Intact      Vestibulo-Ocular Reflex   VOR 1 Head Only (x 1 viewing) slow VOR hard because of neck tightness      Positional Testing   Sidelying Test Sidelying Right;Sidelying Left      Sidelying Right   Sidelying Right Duration lightheadedness with return to sitting    Sidelying Right Symptoms No nystagmus      Sidelying Left   Sidelying Left Duration lightheadedness with return to sitting    Sidelying Left Symptoms No nystagmus      Orthostatics   BP supine (x 5 minutes) 152/77   after supine physical exam x 5 min-- not at rest   HR supine (x 5 minutes) 81    BP sitting 127/74    HR sitting 84    BP standing (after 1 minute) 147/77    HR standing (after 1 minute) 87    Orthostatics Comment Gets lightheadedness with transition from supine to sitting.              Objective measurements completed on examination: See above findings.               PT Education - 12/12/20 1342    Education Details [employer] toolkit handout on postural hypotension    Person(s) Educated Patient    Methods Explanation;Handout    Comprehension Verbalized understanding               PT Long Term Goals - 12/12/20 1344      PT LONG TERM GOAL #1   Title The patient will return demo HEP for flexibility, lumbar mobility, and core stability.    Time 6    Period Weeks    Target Date 01/23/21      PT LONG TERM GOAL #2   Title The patient will demonstrate ability to reach to the ground (to pick up after dogs)  and return to standing without lightheadedness.    Time 6    Period Weeks    Target Date 01/23/21      PT LONG TERM GOAL #3   Title The patient will improve hamstring flexibility R side to -30 from full extension (90/90 supine starting position).    Time 6    Period Weeks    Target Date 01/23/21      PT LONG TERM GOAL #4   Title The patient will be further assessed on Berg balance, and gait speed and goals to follow as indicated.    Time 6    Period Weeks    Target Date 01/23/21      PT LONG TERM GOAL #5   Title The patient will be able to move floor<>stand with UE support due to h/o falls.    Time 6    Period Weeks    Target Date 01/23/21  Plan - 12/12/20 1346    Clinical Impression Statement The patient is a 65 yo male with complex medical history presenting to OP physical therapy due to worsening episodes of lightheadedness, chronic back pain, and worsening mobility.  At today's assessment, he has presentation consistent with postural hypotension.  In addition, he has chronic mobility limitations with severe tightness in HS (R worse than L), muscle weakness, and he holds his breath during movement.  These factors may be contributing to sensation of falling after squating  to pick up after his dogs on walks.  PT provided education on postural hypotension from [facility] toolkit.  Plan to progress to patient tolerance.    Personal Factors and Comorbidities Comorbidity 3+    Comorbidities diabetes, HTN, h/o low back pain since 2006 (fell in the shower)-- has spinal cord stimulator; coronary artery disease    Examination-Activity Limitations Locomotion Level;Lift;Squat;Stand;Bend    Examination-Participation Restrictions Cleaning;Community Activity    Stability/Clinical Decision Making Stable/Uncomplicated    Clinical Decision Making Low    Rehab Potential Good    PT Frequency 2x / week    PT Duration 6 weeks    PT Treatment/Interventions Patient/family  education;ADLs/Self Care Home Management;Aquatic Therapy;Neuromuscular re-education;Therapeutic exercise;Therapeutic activities;Gait training;Taping;Dry needling;Manual techniques;Vestibular    PT Next Visit Plan Check Berg balance scale and gait speed, initiate HEP:  HS stretching, gentle lumbar mobility (is s/p fusion), begin wall slides working on breathing, gastroc stretching; general conditioning for walking    Consulted and Agree with Plan of Care Patient           Patient will benefit from skilled therapeutic intervention in order to improve the following deficits and impairments:  Pain,Dizziness,Decreased range of motion,Decreased strength,Hypomobility,Impaired flexibility,Postural dysfunction,Decreased balance,Decreased activity tolerance  Visit Diagnosis: Other abnormalities of gait and mobility  Muscle weakness (generalized)  Bilateral low back pain without sciatica, unspecified chronicity     Problem List Patient Active Problem List   Diagnosis Date Noted  . Vertigo 11/28/2020  . Myelopathy (HCC) 11/28/2020  . Insomnia 09/03/2020  . Status post coronary artery stent placement   . Mixed hyperlipidemia   . Coronary artery disease involving native coronary artery of native heart with unstable angina pectoris (HCC)   . Chest pain 08/11/2020  . Facet arthropathy, lumbar 06/22/2020  . Major depression in partial remission (HCC) 06/04/2020  . Nausea and vomiting 04/14/2020  . Hyperlipidemia associated with type 2 diabetes mellitus (HCC) 04/14/2020  . Primary hypertension 04/07/2020  . Type 2 diabetes mellitus with other specified complication (HCC) 04/07/2020  . SBO (small bowel obstruction) (HCC) 04/06/2020  . Sepsis (HCC) 10/06/2019  . Status post insertion of spinal cord stimulator 05/19/2017  . 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 Erectile dysfunction 10/09/2015    Evianna Chandran, PT 12/12/2020, 1:52 PM  [facility] [place] [address] [place], [place], [address] Phone: [phone]   Fax:  [phone]  Name: Brandon [name] MRN: [id number] Date of Birth: [birth year]

## 2020-12-13 ENCOUNTER — Encounter: Payer: Self-pay | Admitting: Family Medicine

## 2020-12-13 ENCOUNTER — Other Ambulatory Visit: Payer: Self-pay

## 2020-12-13 ENCOUNTER — Other Ambulatory Visit: Payer: Self-pay | Admitting: Family Medicine

## 2020-12-13 ENCOUNTER — Other Ambulatory Visit: Payer: Self-pay | Admitting: *Deleted

## 2020-12-13 ENCOUNTER — Other Ambulatory Visit (HOSPITAL_COMMUNITY): Payer: Self-pay

## 2020-12-13 ENCOUNTER — Encounter (HOSPITAL_COMMUNITY)
Admission: RE | Admit: 2020-12-13 | Discharge: 2020-12-13 | Disposition: A | Discharge status: Home / Self Care | Payer: No Typology Code available for payment source | Source: Ambulatory Visit | Attending: Cardiology | Admitting: Cardiology

## 2020-12-13 DIAGNOSIS — Z955 Presence of coronary angioplasty implant and graft: Secondary | ICD-10-CM | POA: Diagnosis not present

## 2020-12-13 DIAGNOSIS — E291 Testicular hypofunction: Secondary | ICD-10-CM

## 2020-12-13 MED ORDER — TESTOSTERONE CYPIONATE 200 MG/ML IM SOLN
200.0000 mg | INTRAMUSCULAR | 1 refills | Status: DC
  Filled 2020-12-13: qty 4, 28d supply, fill #0
  Filled 2020-12-15 – 2021-01-19 (×2): qty 4, 28d supply, fill #1
  Filled 2021-03-06: qty 2, 14d supply, fill #2

## 2020-12-13 MED ORDER — TESTOSTERONE CYPIONATE 200 MG/ML IJ SOLN
200.0000 mg | INTRAMUSCULAR | 1 refills | Status: DC
Start: 2020-12-17 — End: 2020-12-13

## 2020-12-14 ENCOUNTER — Encounter: Payer: No Typology Code available for payment source | Admitting: Physical Therapy

## 2020-12-14 ENCOUNTER — Other Ambulatory Visit: Payer: Self-pay

## 2020-12-15 ENCOUNTER — Other Ambulatory Visit: Payer: Self-pay

## 2020-12-15 ENCOUNTER — Other Ambulatory Visit (HOSPITAL_COMMUNITY): Payer: Self-pay

## 2020-12-15 ENCOUNTER — Encounter (HOSPITAL_COMMUNITY)
Admission: RE | Admit: 2020-12-15 | Discharge: 2020-12-15 | Disposition: A | Discharge status: Home / Self Care | Payer: No Typology Code available for payment source | Source: Ambulatory Visit | Attending: Cardiology | Admitting: Cardiology

## 2020-12-15 DIAGNOSIS — Z955 Presence of coronary angioplasty implant and graft: Secondary | ICD-10-CM

## 2020-12-18 ENCOUNTER — Encounter (HOSPITAL_COMMUNITY)
Admission: RE | Admit: 2020-12-18 | Discharge: 2020-12-18 | Disposition: A | Discharge status: Home / Self Care | Payer: No Typology Code available for payment source | Source: Ambulatory Visit | Attending: Cardiology | Admitting: Cardiology

## 2020-12-18 ENCOUNTER — Other Ambulatory Visit: Payer: Self-pay

## 2020-12-18 DIAGNOSIS — Z955 Presence of coronary angioplasty implant and graft: Secondary | ICD-10-CM

## 2020-12-18 NOTE — Progress Notes (Signed)
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  amLODipine (NORVASC) 10 MG tablet, Take 10 mg by mouth every morning., Disp: , Rfl:  .  ARIPiprazole (ABILIFY) 10 MG tablet, Take 10 mg by mouth every morning., Disp: , Rfl:  .  aspirin 81 MG EC tablet, Take 81 mg by mouth every morning., Disp: , Rfl:  .  atorvastatin (LIPITOR) 80 MG tablet, TAKE 1 TABLET (80 MG TOTAL) BY MOUTH DAILY., Disp: 90 tablet, Rfl: 3 .  Blood Glucose Monitoring Suppl (FREESTYLE LITE) w/Device KIT, USE AS DIRECTED., Disp: 1 kit, Rfl: 99 .  Blood Pressure Monitoring (OMRON 3 SERIES BP MONITOR) DEVI, USE AS DIRECTED., Disp: 1 each, Rfl: 0 .  buPROPion (WELLBUTRIN XL) 150 MG 24 hr tablet, TAKE 1 TABLET BY MOUTH ONCE DAILY, Disp: 90 tablet, Rfl: 0 .  buPROPion (WELLBUTRIN XL) 300 MG 24 hr tablet, TAKE 1 TABLET BY MOUTH ONCE DAILY, Disp: 90 tablet, Rfl: 0 .  clopidogrel (PLAVIX) 75 MG tablet, TAKE 1 TABLET (75 MG TOTAL) BY MOUTH DAILY WITH BREAKFAST., Disp: 90 tablet, Rfl: 3 .  Continuous Blood Gluc Sensor (DEXCOM G6 SENSOR) MISC, Use as directed. Change sensor every 10 days, Disp: 9 each, Rfl: 3 .  empagliflozin (JARDIANCE) 25 MG TABS tablet, TAKE 1 TABLET BY MOUTH ONCE DAILY, Disp: 90 tablet, Rfl: 0 .  glucose blood test strip, USE 1 TO CHECK BLOOD GLUCOSE UP TO 5 TIMES DAILY. (Patient taking differently: USE 1 TO CHECK BLOOD GLUCOSE UP TO 5 TIMES DAILY.), Disp: 300 strip, Rfl: 12 .  insulin degludec  (TRESIBA FLEXTOUCH) 100 UNIT/ML FlexTouch Pen, Inject 50 Units into the skin daily., Disp: 150 mL, Rfl: 3 .  insulin degludec (TRESIBA) 100 UNIT/ML FlexTouch Pen, INJECT 60 UNITS INTO THE SKIN ONCE DAILY AS INSTRUCTED., Disp: 180 mL, Rfl: 0 .  insulin lispro (HUMALOG) 100 UNIT/ML KwikPen, Inject 10-24 Units into the skin 3 (three) times daily before meals., Disp: 15 mL, Rfl: 11 .  insulin lispro (HUMALOG) 100 UNIT/ML KwikPen, INJECT UP TO 45 UNITS INTO THE SKIN WITH A MEAL AS INSTRUCTED DAILY, Disp: 60 mL, Rfl: 0 .  isosorbide mononitrate (IMDUR) 30 MG 24 hr tablet, Take 1 tablet (30 mg total) by mouth daily., Disp: 90 tablet, Rfl: 1 .  Lancets (FREESTYLE) lancets, USE 1 TO CHECK BLOOD GLUCOSE UP TO 5 TIMES DAILY. (Patient taking differently: USE 1 TO CHECK BLOOD GLUCOSE UP TO 5 TIMES DAILY.), Disp: 300 each, Rfl: 12 .  losartan (COZAAR) 25 MG tablet, Take 1 tablet (25 mg total) by mouth daily. Take only when systolic BP greater than 676., Disp: 90 tablet, Rfl: 1 .  metoCLOPramide (REGLAN) 10 MG tablet, Take 10 mg by mouth daily as needed for nausea or vomiting (upset stomach). PRN, Disp: , Rfl:  .  metoprolol succinate (TOPROL-XL) 50 MG 24 hr tablet, Take 1 tablet (50 mg total) by mouth daily. Take with or immediately following a meal., Disp: 90 tablet, Rfl: 1 .  nitroGLYCERIN (  NITROSTAT) 0.4 MG SL tablet, Place 1 tablet (0.4 mg total) under the tongue every 5 (five) minutes as needed for chest pain., Disp: 60 tablet, Rfl: 0 .  ondansetron (ZOFRAN ODT) 4 MG disintegrating tablet, Take 1 tablet (4 mg total) by mouth every 8 (eight) hours as needed for nausea or vomiting., Disp: 20 tablet, Rfl: 0 .  pantoprazole (PROTONIX) 40 MG tablet, TAKE 1 TABLET BY MOUTH ONCE DAILY, Disp: 90 tablet, Rfl: 0 .  tamsulosin (FLOMAX) 0.4 MG CAPS capsule, TAKE 1 CAPSULE BY MOUTH ONCE DAILY., Disp: 90 capsule, Rfl: 0 .  testosterone cypionate (DEPOTESTOSTERONE CYPIONATE) 200 MG/ML injection, Inject 1 mL (200 mg total) into  the muscle every Sunday., Disp: 5 mL, Rfl: 1 .  tiZANidine (ZANAFLEX) 4 MG tablet, Take 2 tablets (8 mg total) by mouth every 8 (eight) hours as needed for muscle spasms., Disp: , Rfl:  .  traMADol (ULTRAM) 50 MG tablet, TAKE 2 TABLETS (100 MG TOTAL) BY MOUTH 3 (THREE) TIMES DAILY AS NEEDED., Disp: 180 tablet, Rfl: 0 .  traZODone (DESYREL) 50 MG tablet, TAKE 1-2 TABLETS (50-100 MG TOTAL) BY MOUTH AT BEDTIME AS NEEDED FOR SLEEP., Disp: 60 tablet, Rfl: 3 .  triazolam (HALCION) 0.25 MG tablet, TAKE 1-2 TABS BY MOUTH 2 HOURS BEFORE PROCEDURE OR IMAGING.DO NOT DRIVE WITH THIS MEDICATION., Disp: 2 tablet, Rfl: 0  Past Medical History: Past Medical History:  Diagnosis Date  . Anxiety   . Basal cell carcinoma (BCC) in situ of skin   . Colon polyps   . Coronary artery disease   . Depression   . Diabetes mellitus without complication ([place])   . Hypertension   . SBO (small bowel obstruction) ([facility])     Tobacco Use: Social History   Tobacco Use  Smoking Status Former Smoker  . Quit date: 2007  . Years since quitting: 15.3  Smokeless Tobacco Never Used    Labs: Recent Chemical engineer    Labs for ITP Cardiac and Pulmonary Rehab Latest Ref Rng & Units 04/07/2020 08/13/2020 10/23/2020 11/15/2020   Cholestrol 100 - 199 mg/dL - 160 113 -   LDLCALC 0 - 99 mg/dL - 76 53 -   HDL >39 mg/dL - 32(L) 45 -   Trlycerides 0 - 149 mg/dL - 258(H) 74 -   Hemoglobin A1c 4.0 - 5.6 % 7.2(H) 10.1(H) - 6.5(A)      Capillary Blood Glucose: Lab Results  Component Value Date   GLUCAP 171 (H) 11/15/2020   GLUCAP 255 (H) 11/15/2020   GLUCAP 140 (H) 11/13/2020   GLUCAP 165 (H) 11/13/2020   GLUCAP 135 (H) 08/16/2020     Exercise Target Goals: Exercise Program Goal: Individual exercise prescription set using results from initial 6 min walk test and THRR while considering  patient's activity barriers and safety.   Exercise Prescription Goal: Starting with aerobic activity 30 plus minutes a day, 3 days  per week for initial exercise prescription. Provide home exercise prescription and guidelines that participant acknowledges understanding prior to discharge.  Activity Barriers & Risk Stratification:  Activity Barriers & Cardiac Risk Stratification - 11/07/20 1044      Activity Barriers & Cardiac Risk Stratification   Activity Barriers Arthritis;Back Problems;Joint Problems;Fibromyalgia;Other (comment)    Comments Back surgery in 2006 with ongoing back pain. L3-L5 fused, S1 joints arthritic and degenerative. ROM in back decreased by 40%. Has spinal cord stimulator. Hip pain with prolonged walking.    Cardiac Risk Stratification Moderate  6 Minute Walk:  6 Minute Walk    Row Name 11/07/20 1054         6 Minute Walk   Phase Initial     Distance 1064 feet     Walk Time 6 minutes     # of Rest Breaks 0     MPH 2.01     METS 2.24     RPE 13     Perceived Dyspnea  1     VO2 Peak 7.84     Symptoms Yes (comment)     Comments Patient reported 6/10 left hip pain and mild SOB, 1/4 on dyspnea scale.     Resting HR 79 bpm     Resting BP 102/60     Resting Oxygen Saturation  98 %     Exercise Oxygen Saturation  during 6 min walk 98 %     Max Ex. HR 86 bpm     Max Ex. BP 134/72     2 Minute Post BP 118/70            Oxygen Initial Assessment:   Oxygen Re-Evaluation:   Oxygen Discharge (Final Oxygen Re-Evaluation):   Initial Exercise Prescription:  Initial Exercise Prescription - 11/07/20 1100      Date of Initial Exercise RX and Referring Provider   Date 11/07/20    Referring Provider Freada Bergeron, MD    Expected Discharge Date 01/05/21      T5 Nustep   Level 2    SPM 85    Minutes 25    METs 1.8      Prescription Details   Frequency (times per week) 3    Duration Progress to 30 minutes of continuous aerobic without signs/symptoms of physical distress      Intensity   THRR 40-80% of Max Heartrate 62-124    Ratings of Perceived Exertion 11-13     Perceived Dyspnea 0-4      Progression   Progression Continue to progress workloads to maintain intensity without signs/symptoms of physical distress.      Resistance Training   Training Prescription Yes    Weight 4 lbs    Reps 10-15           Perform Capillary Blood Glucose checks as needed.  Exercise Prescription Changes:   Exercise Prescription Changes    Row Name 11/13/20 1048 11/20/20 1044 12/08/20 1045 12/18/20 1050       Response to Exercise   Blood Pressure (Admit) 120/60 150/76 122/76 142/64    Blood Pressure (Exercise) 122/62 128/64 118/58 128/62    Blood Pressure (Exit) 118/68 118/70 104/58 100/60    Heart Rate (Admit) 89 bpm 92 bpm 97 bpm 90 bpm    Heart Rate (Exercise) 89 bpm 92 bpm 94 bpm 93 bpm    Heart Rate (Exit) 81 bpm 85 bpm 87 bpm 86 bpm    Rating of Perceived Exertion (Exercise) _0 Symptoms Patient c/o SI joint pain, which is chronic for him. -- -- --    Comments Patient completed 1st session of exercise fair with some joint pain. -- -- --    Duration Progress to 30 minutes of  aerobic without signs/symptoms of physical distress Progress to 30 minutes of  aerobic without signs/symptoms of physical distress Progress to 30 minutes of  aerobic without signs/symptoms of physical distress Progress to 30 minutes of  aerobic without signs/symptoms of physical distress    Intensity THRR unchanged THRR  unchanged THRR unchanged THRR unchanged         Progression   Progression Continue to progress workloads to maintain intensity without signs/symptoms of physical distress. Continue to progress workloads to maintain intensity without signs/symptoms of physical distress. Continue to progress workloads to maintain intensity without signs/symptoms of physical distress. Continue to progress workloads to maintain intensity without signs/symptoms of physical distress.    Average METs 1.5 1.7 1.5 1.7         Resistance Training   Training Prescription Yes Yes  Yes Yes    Weight 4 lbs 4 lbs 4 lbs 4 lbs    Reps 10-15 10-15 10-15 10-15    Time 10 Minutes 10 Minutes 10 Minutes 10 Minutes         Interval Training   Interval Training No No No No         NuStep   Level _0 SPM 85 85 85 85    Minutes _1 METs 1.5 1.7 1.5 1.7         T5 Nustep   Level -- -- -- --    SPM -- -- -- --    Minutes -- -- -- --    METs -- -- -- --         Home Exercise Plan   Plans to continue exercise at -- -- Home (comment)  Walking Home (comment)  Walking    Frequency -- -- Add 4 additional days to program exercise sessions. Add 4 additional days to program exercise sessions.    Initial Home Exercises Provided -- -- 12/08/20 12/08/20           Exercise Comments:   Exercise Comments    Row Name 11/13/20 1141 12/08/20 1049 12/18/20 1139       Exercise Comments Patient completed 1st session of exercise fair with some joint pain. Reviewed home exercise guideline, METs, and goals with patient. Reviewed METs and goals with patient.            Exercise Goals and Review:   Exercise Goals    Row Name 11/07/20 1045             Exercise Goals   Increase Physical Activity Yes       Intervention Provide advice, education, support and counseling about physical activity/exercise needs.;Develop an individualized exercise prescription for aerobic and resistive training based on initial evaluation findings, risk stratification, comorbidities and participant's personal goals.       Expected Outcomes Short Term: Attend rehab on a regular basis to increase amount of physical activity.;Long Term: Exercising regularly at least 3-5 days a week.;Long Term: Add in home exercise to make exercise part of routine and to increase amount of physical activity.       Increase Strength and Stamina Yes       Intervention Provide advice, education, support and counseling about physical activity/exercise needs.;Develop an individualized exercise prescription  for aerobic and resistive training based on initial evaluation findings, risk stratification, comorbidities and participant's personal goals.       Expected Outcomes Short Term: Increase workloads from initial exercise prescription for resistance, speed, and METs.;Short Term: Perform resistance training exercises routinely during rehab and add in resistance training at home;Long Term: Improve cardiorespiratory fitness, muscular endurance and strength as measured by increased METs and functional capacity (6MWT)       Able to understand and use rate of perceived exertion (RPE) scale Yes  Intervention Provide education and explanation on how to use RPE scale       Expected Outcomes Short Term: Able to use RPE daily in rehab to express subjective intensity level;Long Term:  Able to use RPE to guide intensity level when exercising independently       Knowledge and understanding of Target Heart Rate Range (THRR) Yes       Intervention Provide education and explanation of THRR including how the numbers were predicted and where they are located for reference       Expected Outcomes Short Term: Able to state/look up THRR;Long Term: Able to use THRR to govern intensity when exercising independently;Short Term: Able to use daily as guideline for intensity in rehab       Able to check pulse independently Yes       Intervention Provide education and demonstration on how to check pulse in carotid and radial arteries.;Review the importance of being able to check your own pulse for safety during independent exercise       Expected Outcomes Short Term: Able to explain why pulse checking is important during independent exercise;Long Term: Able to check pulse independently and accurately       Understanding of Exercise Prescription Yes       Intervention Provide education, explanation, and written materials on patient's individual exercise prescription       Expected Outcomes Short Term: Able to explain program  exercise prescription;Long Term: Able to explain home exercise prescription to exercise independently              Exercise Goals Re-Evaluation :  Exercise Goals Re-Evaluation    Row Name 11/13/20 1141 12/08/20 1049 12/18/20 1139         Exercise Goal Re-Evaluation   Exercise Goals Review Able to understand and use rate of perceived exertion (RPE) scale;Increase Physical Activity Able to understand and use rate of perceived exertion (RPE) scale;Increase Physical Activity;Understanding of Exercise Prescription;Increase Strength and Stamina;Knowledge and understanding of Target Heart Rate Range (THRR);Able to check pulse independently Able to understand and use rate of perceived exertion (RPE) scale;Increase Physical Activity;Understanding of Exercise Prescription;Increase Strength and Stamina;Knowledge and understanding of Target Heart Rate Range (THRR);Able to check pulse independently     Comments Patient able to understand and use RPE scale appropriately. Patient plans to walk 30 minutes 4-7 days/week as his mode of home exercise. Patient is able to manually count his pulse. Patient is walking about 600 yards in 10 minutes, 2-3 days/week. Discussed increasing walking duration, and patient states he can only walk about 6 minutes continuous because of bilateral hip pain. Discussed walking 10 minutes, 3 times/day taking rest breaks as needed at least 2 days/week, and patient is amenable to this.     Expected Outcomes Progress workloads as tolerated to help achieve personal health and fitness goals. Patient will walk 30 minutes, 4 days/ week in addition to exercise at cardiac rehab to help improve cardiorespiratory fitness. Patient will accumulate 30 minutes of walking at least 2 days/week in addition to exercise at cardiac rehab.             Discharge Exercise Prescription (Final Exercise Prescription Changes):  Exercise Prescription Changes - 12/18/20 1050      Response to Exercise   Blood  Pressure (Admit) 142/64    Blood Pressure (Exercise) 128/62    Blood Pressure (Exit) 100/60    Heart Rate (Admit) 90 bpm    Heart Rate (Exercise) 93 bpm    Heart  Rate (Exit) 86 bpm    Rating of Perceived Exertion (Exercise) 9    Duration Progress to 30 minutes of  aerobic without signs/symptoms of physical distress    Intensity THRR unchanged      Progression   Progression Continue to progress workloads to maintain intensity without signs/symptoms of physical distress.    Average METs 1.7      Resistance Training   Training Prescription Yes    Weight 4 lbs    Reps 10-15    Time 10 Minutes      Interval Training   Interval Training No      NuStep   Level 4    SPM 85    Minutes 25    METs 1.7      Home Exercise Plan   Plans to continue exercise at Home (comment)   Walking   Frequency Add 4 additional days to program exercise sessions.    Initial Home Exercises Provided 12/08/20           Nutrition:  Target Goals: Understanding of nutrition guidelines, daily intake of sodium <1562m, cholesterol <2039m calories 30% from fat and 7% or less from saturated fats, daily to have 5 or more servings of fruits and vegetables.  Biometrics:  Pre Biometrics - 11/07/20 1050      Pre Biometrics   Waist Circumference 46.5 inches    Hip Circumference 43.25 inches    Waist to Hip Ratio 1.08 %    Triceps Skinfold 10 mm    % Body Fat 31.2 %    Grip Strength 44.5 kg    Flexibility --   N/A due to back problems.   Single Leg Stand 5.93 seconds            Nutrition Therapy Plan and Nutrition Goals:  Nutrition Therapy & Goals - 11/24/20 1207      Nutrition Therapy   Diet TLC; carb modified    Drug/Food Interactions Statins/Certain Fruits      Personal Nutrition Goals   Nutrition Goal Pt to identify food quantities necessary to achieve weight loss of 6-24 lb at graduation from cardiac rehab.    Personal Goal #2 Pt to build a healthy plate including vegetables, fruits, whole  grains, and low-fat dairy products in a heart healthy meal plan.    Personal Goal #3 Pt to continue checking CBGs and keep WNL      Intervention Plan   Intervention Prescribe, educate and counsel regarding individualized specific dietary modifications aiming towards targeted core components such as weight, hypertension, lipid management, diabetes, heart failure and other comorbidities.;Nutrition handout(s) given to patient.    Expected Outcomes Short Term Goal: A plan has been developed with personal nutrition goals set during dietitian appointment.;Long Term Goal: Adherence to prescribed nutrition plan.           Nutrition Assessments:  MEDIFICTS Score Key:  ?70 Need to make dietary changes   40-70 Heart Healthy Diet  ? 40 Therapeutic Level Cholesterol Diet  Flowsheet Row CARDI[facility]HAB [facility] [facility] [place] [facility]     Picture Your Plate Scores:  <4<94nhealthy dietary pattern with much room for improvement.  41-50 Dietary pattern unlikely to meet recommendations for good health and room for improvement.  51-60 More healthful dietary pattern, with some room for improvement.   >60 Healthy dietary pattern, although there may be some specific behaviors that could be improved.  Nutrition Goals Re-Evaluation:  Nutrition Goals Re-Evaluation    [place] Name 11/24/20 1208 12/15/20 1329           Goals   Current Weight 243 lb (110.2 kg) 241 lb 6.5 oz (109.5 kg)      Nutrition Goal Pt to identify food quantities necessary to achieve weight loss of 6-24 lb at graduation from cardiac rehab. Pt to identify food quantities necessary to achieve weight loss of 6-24 lb at graduation from cardiac rehab.      Expected Outcome Weight loss Weight loss             Personal Goal #2 Re-Evaluation   Personal Goal #2 Pt to build a healthy plate including vegetables, fruits, whole grains, and low-fat  dairy products in a heart healthy meal plan. Pt to build a healthy plate including vegetables, fruits, whole grains, and low-fat dairy products in a heart healthy meal plan.             Personal Goal #3 Re-Evaluation   Personal Goal #3 Pt to continue checking CBGs and keep WNL Pt to continue checking CBGs and keep WNL             Nutrition Goals Discharge (Final Nutrition Goals Re-Evaluation):  Nutrition Goals Re-Evaluation - 12/15/20 1329      Goals   Current Weight 241 lb 6.5 oz (109.5 kg)    Nutrition Goal Pt to identify food quantities necessary to achieve weight loss of 6-24 lb at graduation from cardiac rehab.    Expected Outcome Weight loss      Personal Goal #2 Re-Evaluation   Personal Goal #2 Pt to build a healthy plate including vegetables, fruits, whole grains, and low-fat dairy products in a heart healthy meal plan.      Personal Goal #3 Re-Evaluation   Personal Goal #3 Pt to continue checking CBGs and keep WNL           Psychosocial: Target Goals: Acknowledge presence or absence of significant depression and/or stress, maximize coping skills, provide positive support system. Participant is able to verbalize types and ability to use techniques and skills needed for reducing stress and depression.  Initial Review & Psychosocial Screening:  Initial Psych Review & Screening - 11/07/20 1201      Initial Review   Current issues with History of Depression;Current Anxiety/Panic;Current Stress Concerns    Source of Stress Concerns Chronic Illness;Unable to perform yard/household activities    Comments Shahzain has been disabled due to breaking his back in 2006 continues to have problems with back pain. History of depression currently controlled on two antidepressants.      Family Dynamics   Good Support System? Yes   Trell has his wife and 4 children for suport     Barriers   Psychosocial barriers to participate in program The patient should benefit from training in stress  management and relaxation.      Screening Interventions   Interventions Encouraged to exercise;To provide support and resources with identified psychosocial needs;Provide feedback about the scores to participant    Expected Outcomes Long Term Goal: Stressors or current issues are controlled or eliminated.;Short Term goal: Identification and review with participant of any Quality of Life or Depression concerns found by scoring the questionnaire.           Quality of Life Scores:  Quality of Life - 11/07/20 1141      Quality of Life   Select Quality of Life  Quality of Life Scores   Health/Function Pre 17.9 %    Socioeconomic Pre 20.33 %    Psych/Spiritual Pre 21.93 %    Family Pre 25 %    GLOBAL Pre 20.27 %          Scores of 19 and below usually indicate a poorer quality of life in these areas.  A difference of  2-3 points is a clinically meaningful difference.  A difference of 2-3 points in the total score of the Quality of Life Index has been associated with significant improvement in overall quality of life, self-image, physical symptoms, and general health in studies assessing change in quality of life.  PHQ-9: Recent Review Flowsheet Data    Depression screen [facility] 2/9 11/07/2020 06/02/2020   Decreased Interest 0 1   Down, Depressed, Hopeless 0 1   PHQ - 2 Score 0 2   Altered sleeping - 2   Tired, decreased energy - 2   Change in appetite - 2   Feeling bad or failure about yourself  - 0   Trouble concentrating - 0   Moving slowly or fidgety/restless - 0   Suicidal thoughts - 0   PHQ-9 Score - 8   Difficult doing work/chores - Somewhat difficult     Interpretation of Total Score  Total Score Depression Severity:  1-4 = Minimal depression, 5-9 = Mild depression, 10-14 = Moderate depression, 15-19 = Moderately severe depression, 20-27 = Severe depression   Psychosocial Evaluation and Intervention:   Psychosocial Re-Evaluation:  Psychosocial Re-Evaluation    [place]  Name 11/20/20 1631 12/18/20 1416           Psychosocial Re-Evaluation   Current issues with History of Depression;Current Stress Concerns;Current Anxiety/Panic History of Depression;Current Stress Concerns;Current Anxiety/Panic      Comments Dimitri denies having increased depression and anxiety. Quality of life questionnaire forwarded to Dr Zigmund Daniel. Adalid's depression is currently controlled on 2 antidepressants Darrin denies having increased depression and anxiety. Franklyn's depression is currently controlled on 2 antidepressants. Cameron mentioned having some stress as adult age children are staying with him after they recenlty moved into a larger apartment.      Expected Outcomes [place] will have less anxiety and depression upon completion of phase 2 cardiac rehab. Frazier will have less anxiety and depression upon completion of phase 2 cardiac rehab.      Interventions Relaxation education;Encouraged to attend Cardiac Rehabilitation for the exercise;Stress management education Relaxation education;Encouraged to attend Cardiac Rehabilitation for the exercise;Stress management education      Continue Psychosocial Services  Follow up required by staff Follow up required by staff      Comments Will continue to monitor and offer suport as needed Will continue to monitor and offer suport as needed             Initial Review   Source of Stress Concerns Chronic Illness;Unable to perform yard/household activities;Retirement/disability Chronic Illness;Unable to perform yard/household activities;Retirement/disability             Psychosocial Discharge (Final Psychosocial Re-Evaluation):  Psychosocial Re-Evaluation - 12/18/20 1416      Psychosocial Re-Evaluation   Current issues with History of Depression;Current Stress Concerns;Current Anxiety/Panic    Comments Jaysten denies having increased depression and anxiety. Haakon's depression is currently controlled on 2 antidepressants. Jarmar mentioned having some  stress as adult age children are staying with him after they recenlty moved into a larger apartment.    Expected Outcomes Ashdon will have less anxiety and depression  upon completion of phase 2 cardiac rehab.    Interventions Relaxation education;Encouraged to attend Cardiac Rehabilitation for the exercise;Stress management education    Continue Psychosocial Services  Follow up required by staff    Comments Will continue to monitor and offer suport as needed      Initial Review   Source of Stress Concerns Chronic Illness;Unable to perform yard/household activities;Retirement/disability           Vocational Rehabilitation: Provide vocational rehab assistance to qualifying candidates.   Vocational Rehab Evaluation & Intervention:  Vocational Rehab - 11/07/20 1205      Initial Vocational Rehab Evaluation & Intervention   Assessment shows need for Vocational Rehabilitation No   Jemario is disabled and does not need vocational rehab at this time          Education: Education Goals: Education classes will be provided on a weekly basis, covering required topics. Participant will state understanding/return demonstration of topics presented.  Learning Barriers/Preferences:  Learning Barriers/Preferences - 11/07/20 1204      Learning Barriers/Preferences   Learning Barriers Exercise Concerns   Dizziness when standing too quick   Learning Preferences Skilled Demonstration           Education Topics: Hypertension, Hypertension Reduction -Define heart disease and high blood pressure. Discus how high blood pressure affects the body and ways to reduce high blood pressure.   Exercise and Your Heart -Discuss why it is important to exercise, the FITT principles of exercise, normal and abnormal responses to exercise, and how to exercise safely.   Angina -Discuss definition of angina, causes of angina, treatment of angina, and how to decrease risk of having angina.   Cardiac  Medications -Review what the following cardiac medications are used for, how they affect the body, and side effects that may occur when taking the medications.  Medications include Aspirin, Beta blockers, calcium channel blockers, ACE Inhibitors, angiotensin receptor blockers, diuretics, digoxin, and antihyperlipidemics.   Congestive Heart Failure -Discuss the definition of CHF, how to live with CHF, the signs and symptoms of CHF, and how keep track of weight and sodium intake.   Heart Disease and Intimacy -Discus the effect sexual activity has on the heart, how changes occur during intimacy as we age, and safety during sexual activity.   Smoking Cessation / COPD -Discuss different methods to quit smoking, the health benefits of quitting smoking, and the definition of COPD.   Nutrition I: Fats -Discuss the types of cholesterol, what cholesterol does to the heart, and how cholesterol levels can be controlled.   Nutrition II: Labels -Discuss the different components of food labels and how to read food label   Heart Parts/Heart Disease and PAD -Discuss the anatomy of the heart, the pathway of blood circulation through the heart, and these are affected by heart disease.   Stress I: Signs and Symptoms -Discuss the causes of stress, how stress may lead to anxiety and depression, and ways to limit stress.   Stress II: Relaxation -Discuss different types of relaxation techniques to limit stress.   Warning Signs of Stroke / TIA -Discuss definition of a stroke, what the signs and symptoms are of a stroke, and how to identify when someone is having stroke.   Knowledge Questionnaire Score:  Knowledge Questionnaire Score - 11/07/20 1141      Knowledge Questionnaire Score   Pre Score 21/24           Core Components/Risk Factors/Patient Goals at Admission:  Personal Goals and Risk Factors  at Admission - 11/07/20 1041      Core Components/Risk Factors/Patient Goals on Admission     Weight Management Yes;Obesity;Weight Loss    Intervention Weight Management/Obesity: Establish reasonable short term and long term weight goals.;Obesity: Provide education and appropriate resources to help participant work on and attain dietary goals.    Admit Weight 241 lb 13.5 oz (109.7 kg)    Goal Weight: Short Term 235 lb (106.6 kg)    Goal Weight: Long Term 199 lb (90.3 kg)    Expected Outcomes Short Term: Continue to assess and modify interventions until short term weight is achieved;Long Term: Adherence to nutrition and physical activity/exercise program aimed toward attainment of established weight goal;Weight Loss: Understanding of general recommendations for a balanced deficit meal plan, which promotes 1-2 lb weight loss per week and includes a negative energy balance of [phone] kcal/d    Diabetes Yes    Intervention Provide education about signs/symptoms and action to take for hypo/hyperglycemia.;Provide education about proper nutrition, including hydration, and aerobic/resistive exercise prescription along with prescribed medications to achieve blood glucose in normal ranges: Fasting glucose 65-99 mg/dL    Expected Outcomes Short Term: Participant verbalizes understanding of the signs/symptoms and immediate care of hyper/hypoglycemia, proper foot care and importance of medication, aerobic/resistive exercise and nutrition plan for blood glucose control.;Long Term: Attainment of HbA1C < 7%.    Hypertension Yes    Intervention Provide education on lifestyle modifcations including regular physical activity/exercise, weight management, moderate sodium restriction and increased consumption of fresh fruit, vegetables, and low fat dairy, alcohol moderation, and smoking cessation.;Monitor prescription use compliance.    Expected Outcomes Short Term: Continued assessment and intervention until BP is < 140/48m HG in hypertensive participants. < 130/82mHG in hypertensive participants with  diabetes, heart failure or chronic kidney disease.;Long Term: Maintenance of blood pressure at goal levels.    Lipids Yes    Intervention Provide education and support for participant on nutrition & aerobic/resistive exercise along with prescribed medications to achieve LDL <7016mHDL >46m66m  Expected Outcomes Short Term: Participant states understanding of desired cholesterol values and is compliant with medications prescribed. Participant is following exercise prescription and nutrition guidelines.;Long Term: Cholesterol controlled with medications as prescribed, with individualized exercise RX and with personalized nutrition plan. Value goals: LDL < 70mg60mL > 40 mg.    Stress Yes    Intervention Offer individual and/or small group education and counseling on adjustment to heart disease, stress management and health-related lifestyle change. Teach and support self-help strategies.;Refer participants experiencing significant psychosocial distress to appropriate mental health specialists for further evaluation and treatment. When possible, include family members and significant others in education/counseling sessions.    Expected Outcomes Short Term: Participant demonstrates changes in health-related behavior, relaxation and other stress management skills, ability to obtain effective social support, and compliance with psychotropic medications if prescribed.;Long Term: Emotional wellbeing is indicated by absence of clinically significant psychosocial distress or social isolation.           Core Components/Risk Factors/Patient Goals Review:   Goals and Risk Factor Review    Row Name 11/14/20 0818             Core Components/Risk Factors/Patient Goals Review   Personal Goals Review Weight Management/Obesity;Diabetes;Hypertension;Stress;Lipids       Review TerryKhylanted exercise on 11/13/20. Hester's vital signs and CBG's were stabe       Expected Outcomes TerryAtif continue to participate in  phase 2 cardiac rehab for exercise nutrition  and lifestyle modification              Core Components/Risk Factors/Patient Goals at Discharge (Final Review):   Goals and Risk Factor Review - 11/14/20 0818      Core Components/Risk Factors/Patient Goals Review   Personal Goals Review Weight Management/Obesity;Diabetes;Hypertension;Stress;Lipids    Review Ayvin started exercise on 11/13/20. Marten's vital signs and CBG's were stabe    Expected Outcomes Woodroe will continue to participate in phase 2 cardiac rehab for exercise nutrition and lifestyle modification           ITP Comments:  ITP Comments    Row Name 11/07/20 1051 11/14/20 0815 11/20/20 1630 12/18/20 1411     ITP Comments Dr Fransico Him MD, Medical Director 30 Day ITP Review. Kalvin started exercise on 11/13/20 and did fair with exercise 30 Day ITP Review. Brayton is off to a good start to exercise despite orthapedic limitations 30 Day ITP Review. Pride has good particpation in cardiac rehab andf fair attendance. Eban was out last week due to moving to a new apartment with his family           Comments: See ITP comments.Barnet Pall, RN,BSN 12/19/2020 10:28 AM

## 2020-12-18 NOTE — Progress Notes (Signed)
Nutrition Note  Spoke with pt.  Successful download of Dexcom CGM data.  Continuous glucose monitoring download:      Average is    149  for 10 days   Time sensor is active   71%   Time in range (70-180 mg/dL):  79 % (Goal >54%)   Time High (181-250 mg/dL) 62% (Goal < 70%)   Time Very High (>250 mg/dl)  2 % (Goal < 5%)   Time Low (54-69 mg/dL)  0 % (Goal is <3%)   Time Very Low (<54)  0%  (Goal <1%)   Coefficient of variation  27.5% (Goal is <36%)      Pt had questions about humalog pen. Reviewed injection technique. Pt has not been priming dose. Reviewed how to prime humalog pen. He has not had any hypo events. Pt reviewed how to treat. He keeps glucose tabs with him. Sometimes he takes humalog after he eats. He reports early satiety and not always finishing meals. We reviewed insulin action time.   Discussed consistent carb intake and continued carb counting. Overall pt doing well with blood sugar management.    Nutrition Diagnosis   Obese  II = 35-39.9 related to excessive energy intake and sedentary lifestyle as evidenced by a BMI 35.88 kg/m2  Nutrition Intervention   Pt's individual nutrition plan reviewed with pt.  Benefits of adopting Heart Healthy diet discussed when Picture Your Plate reviewed.  Continue client-centered nutrition education by RD, as part of interdisciplinary care.  Goal(s)  Pt to identify food quantities necessary to achieve weight loss of 6-24 lb at graduation from cardiac rehab.   Pt to build a healthy plate including vegetables, fruits, whole grains, and low-fat dairy products in a heart healthy meal plan.  Pt to continue checking CBGs and keep WNL.  Plan:  Will provide client-centered nutrition education as part of interdisciplinary care  Monitor and evaluate progress toward nutrition goal with team.  Andrey Campanile, MS, RDN, LDN

## 2020-12-19 ENCOUNTER — Ambulatory Visit (INDEPENDENT_AMBULATORY_CARE_PROVIDER_SITE_OTHER): Payer: No Typology Code available for payment source | Admitting: Rehabilitative and Restorative Service Providers"

## 2020-12-19 DIAGNOSIS — M545 Low back pain, unspecified: Secondary | ICD-10-CM

## 2020-12-19 DIAGNOSIS — M6281 Muscle weakness (generalized): Secondary | ICD-10-CM

## 2020-12-19 DIAGNOSIS — R2689 Other abnormalities of gait and mobility: Secondary | ICD-10-CM

## 2020-12-19 NOTE — Therapy (Signed)
[facility] [place] [address] [place], [place], [address] Phone: [phone]   Fax:  [phone]  Physical Therapy Treatment  Patient Details  Name: Brandon [name] MRN: [id number] Date of Birth: 03/23/1956 Referring Provider (PT): Rodney Langton, MD   Encounter Date: 12/19/2020   PT End of Session - 12/19/20 1054    Visit Number 2    Number of Visits 12    Date for PT Re-Evaluation 01/23/21    Authorization Type UMR    PT Start Time 1020    PT Stop Time 1100    PT Time Calculation (min) 40 min    Activity Tolerance Patient tolerated treatment well    Behavior During Therapy [facility] for tasks assessed/performed           Past Medical History:  Diagnosis Date  . Anxiety   . Basal cell carcinoma (BCC) in situ of skin   . Colon polyps   . Coronary artery disease   . Depression   . Diabetes mellitus without complication (HCC)   . Hypertension   . SBO (small bowel obstruction) (HCC)     Past Surgical History:  Procedure Laterality Date  . BACK SURGERY    . CARDIAC CATHETERIZATION    . CATARACT EXTRACTION W/ INTRAOCULAR LENS  IMPLANT, BILATERAL    . CHOLECYSTECTOMY    . CHOLECYSTECTOMY, LAPAROSCOPIC    . CORONARY STENT INTERVENTION N/A 08/15/2020   Procedure: CORONARY STENT INTERVENTION;  Surgeon: Corky Crafts, MD;  Location: [facility] INVASIVE CV LAB;  Service: Cardiovascular;  Laterality: N/A;  . EYE SURGERY    . INTRAVASCULAR ULTRASOUND/IVUS N/A 08/15/2020   Procedure: Intravascular Ultrasound/IVUS;  Surgeon: Corky Crafts, MD;  Location: [facility] INVASIVE CV LAB;  Service: Cardiovascular;  Laterality: N/A;  . LEFT HEART CATH AND CORONARY ANGIOGRAPHY N/A 08/15/2020   Procedure: LEFT HEART CATH AND CORONARY ANGIOGRAPHY;  Surgeon: Corky Crafts, MD;  Location: [facility] INVASIVE CV LAB;  Service: Cardiovascular;  Laterality: N/A;  . LUMBAR FUSION     L3-5 with rods  . XI ROBOTIC ASSISTED INGUINAL HERNIA REPAIR  WITH MESH     x 3 surgeries    There were no vitals filed for this visit.   Subjective Assessment - 12/19/20 1023    Subjective Still notes difficulty rising from a chair-- a sensation of faltering and dizziness.  He tolerated cardiac rehab well this past week.  He has not had as much trouble walking the dogs, still touchy after squating to pick up after the dogs.    Pertinent History diabetes, HTN, h/o low back pain since 2006 (fell in the shower)-- has spinal cord stimulator;  coronary artery disease    Patient Stated Goals to be able to kneel and rise.    Currently in Pain? No/denies              [facility] PT Assessment - 12/19/20 1025      Assessment   Medical Diagnosis vertigo  "Vestibular rehabilitation for vertigo as well as gait training and low back strengthening for lumbar spondylosis."    Referring Provider (PT) Rodney Langton, MD    Onset Date/Surgical Date 11/28/20      Ambulation/Gait   Ambulation/Gait Yes    Ambulation/Gait Assistance 6: Modified independent (Device/Increase time)   slowed pace   Ambulation Distance (Feet) 100 Feet    Gait velocity 2.26 ft/sec      Standardized Balance Assessment   Standardized Balance Assessment Berg Balance Test  The patient will improve hamstring flexibility R side to -30 from full extension (90/90 supine starting position).    Time 6    Period Weeks    Target Date 01/23/21      PT LONG TERM GOAL #4   Title The patient will be further assessed on Berg balance, and gait speed and goals to follow as indicated.    Time 6    Period Weeks    Target Date 01/23/21      PT LONG TERM GOAL #5   Title The patient will be able to move floor<>stand with UE support due to h/o falls.    Time 6    Period Weeks    Target Date 01/23/21                 Plan - 12/19/20 1101    Clinical Impression Statement The patient tolerated PT well today.  He scores as increased fall risk per Berg balance score of 40/56.   PT to continue to address LE weakness, imbalance, and general mobility.    Personal Factors and Comorbidities Comorbidity 3+    Comorbidities diabetes, HTN, h/o low back pain since 2006 (fell in the shower)-- has spinal cord stimulator; coronary artery disease    Examination-Activity Limitations Locomotion Level;Lift;Squat;Stand;Bend    Examination-Participation Restrictions Cleaning;Community Activity    Stability/Clinical Decision Making Stable/Uncomplicated    Rehab Potential Good    PT Frequency 2x / week    PT Duration 6 weeks    PT Treatment/Interventions Patient/family education;ADLs/Self Care Home Management;Aquatic Therapy;Neuromuscular re-education;Therapeutic exercise;Therapeutic activities;Gait training;Taping;Dry needling;Manual techniques;Vestibular    PT Next Visit Plan give aquatic handout; progress gat and LE strengthening, lumbar mobility (h/o fusion) to tolerance, flexibility    Consulted and Agree with Plan of Care Patient           Patient will benefit from skilled therapeutic intervention in order to improve the following  deficits and impairments:  Pain,Dizziness,Decreased range of motion,Decreased strength,Hypomobility,Impaired flexibility,Postural dysfunction,Decreased balance,Decreased activity tolerance  Visit Diagnosis: Other abnormalities of gait and mobility  Muscle weakness (generalized)  Bilateral low back pain without sciatica, unspecified chronicity     Problem List Patient Active Problem List   Diagnosis Date Noted  . Vertigo 11/28/2020  . Myelopathy (HCC) 11/28/2020  . Insomnia 09/03/2020  . Status post coronary artery stent placement   . Mixed hyperlipidemia   . Coronary artery disease involving native coronary artery of native heart with unstable angina pectoris (HCC)   . Chest pain 08/11/2020  . Facet arthropathy, lumbar 06/22/2020  . Major depression in partial remission (HCC) 06/04/2020  . Nausea and vomiting 04/14/2020  . Hyperlipidemia associated with type 2 diabetes mellitus (HCC) 04/14/2020  . Primary hypertension 04/07/2020  . Type 2 diabetes mellitus with other specified complication (HCC) 04/07/2020  . SBO (small bowel obstruction) (HCC) 04/06/2020  . Sepsis (HCC) 10/06/2019  . Status post insertion of spinal cord stimulator 05/19/2017  . 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 Erectile dysfunction 10/09/2015    Milynn Quirion, PT 12/19/2020, 1:09 PM  [facility] [address] [place], [place], [address] Phone: [phone]   Fax:  [phone]  Name: Brandon [name] MRN: [id number] Date of Birth: [birth year]  The patient will improve hamstring flexibility R side to -30 from full extension (90/90 supine starting position).    Time 6    Period Weeks    Target Date 01/23/21      PT LONG TERM GOAL #4   Title The patient will be further assessed on Berg balance, and gait speed and goals to follow as indicated.    Time 6    Period Weeks    Target Date 01/23/21      PT LONG TERM GOAL #5   Title The patient will be able to move floor<>stand with UE support due to h/o falls.    Time 6    Period Weeks    Target Date 01/23/21                 Plan - 12/19/20 1101    Clinical Impression Statement The patient tolerated PT well today.  He scores as increased fall risk per Berg balance score of 40/56.   PT to continue to address LE weakness, imbalance, and general mobility.    Personal Factors and Comorbidities Comorbidity 3+    Comorbidities diabetes, HTN, h/o low back pain since 2006 (fell in the shower)-- has spinal cord stimulator; coronary artery disease    Examination-Activity Limitations Locomotion Level;Lift;Squat;Stand;Bend    Examination-Participation Restrictions Cleaning;Community Activity    Stability/Clinical Decision Making Stable/Uncomplicated    Rehab Potential Good    PT Frequency 2x / week    PT Duration 6 weeks    PT Treatment/Interventions Patient/family education;ADLs/Self Care Home Management;Aquatic Therapy;Neuromuscular re-education;Therapeutic exercise;Therapeutic activities;Gait training;Taping;Dry needling;Manual techniques;Vestibular    PT Next Visit Plan give aquatic handout; progress gat and LE strengthening, lumbar mobility (h/o fusion) to tolerance, flexibility    Consulted and Agree with Plan of Care Patient           Patient will benefit from skilled therapeutic intervention in order to improve the following  deficits and impairments:  Pain,Dizziness,Decreased range of motion,Decreased strength,Hypomobility,Impaired flexibility,Postural dysfunction,Decreased balance,Decreased activity tolerance  Visit Diagnosis: Other abnormalities of gait and mobility  Muscle weakness (generalized)  Bilateral low back pain without sciatica, unspecified chronicity     Problem List Patient Active Problem List   Diagnosis Date Noted  . Vertigo 11/28/2020  . Myelopathy (HCC) 11/28/2020  . Insomnia 09/03/2020  . Status post coronary artery stent placement   . Mixed hyperlipidemia   . Coronary artery disease involving native coronary artery of native heart with unstable angina pectoris (HCC)   . Chest pain 08/11/2020  . Facet arthropathy, lumbar 06/22/2020  . Major depression in partial remission (HCC) 06/04/2020  . Nausea and vomiting 04/14/2020  . Hyperlipidemia associated with type 2 diabetes mellitus (HCC) 04/14/2020  . Primary hypertension 04/07/2020  . Type 2 diabetes mellitus with other specified complication (HCC) 04/07/2020  . SBO (small bowel obstruction) (HCC) 04/06/2020  . Sepsis (HCC) 10/06/2019  . Status post insertion of spinal cord stimulator 05/19/2017  . 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 Erectile dysfunction 10/09/2015    Milynn Quirion, PT 12/19/2020, 1:09 PM  [facility] [address] [place], [place], [address] Phone: [phone]   Fax:  [phone]  Name: Brandon [name] MRN: [id number] Date of Birth: [birth year]

## 2020-12-19 NOTE — Patient Instructions (Signed)
Access Code: [email or web address] URL: [email or web address] Date: 12/19/2020 Prepared by: Margretta Ditty  Exercises Gastroc Stretch on Wall - 2 x daily - 7 x weekly - 1 sets - 2 reps - 30 seconds hold Seated Hamstring Stretch with Chair - 2 x daily - 7 x weekly - 1 sets - 2 reps - 30 seconds hold Sit to Stand with Armchair - 2 x daily - 7 x weekly - 1 sets - 10 reps Wall Quarter Squat - 2 x daily - 7 x weekly - 1 sets - 10 reps Heel rises with counter support - 2 x daily - 7 x weekly - 1 sets - 10-20 reps Standing with Forearms Thoracic Rotation - 2 x daily - 7 x weekly - 1 sets - 10 reps Romberg Stance with Eyes Closed - 2 x daily - 7 x weekly - 1 sets - 3 reps - 20-30 seconds hold Single Leg Stance with Support - 2 x daily - 7 x weekly - 1 sets - 3 reps - 10-15 seconds hold

## 2020-12-20 ENCOUNTER — Encounter (HOSPITAL_COMMUNITY)
Admission: RE | Admit: 2020-12-20 | Discharge: 2020-12-20 | Disposition: A | Discharge status: Home / Self Care | Payer: No Typology Code available for payment source | Source: Ambulatory Visit | Attending: Cardiology | Admitting: Cardiology

## 2020-12-20 ENCOUNTER — Other Ambulatory Visit: Payer: Self-pay

## 2020-12-20 DIAGNOSIS — Z955 Presence of coronary angioplasty implant and graft: Secondary | ICD-10-CM

## 2020-12-21 ENCOUNTER — Encounter: Payer: No Typology Code available for payment source | Admitting: Physical Therapy

## 2020-12-21 ENCOUNTER — Encounter: Payer: Self-pay | Admitting: Physical Therapy

## 2020-12-22 ENCOUNTER — Ambulatory Visit (INDEPENDENT_AMBULATORY_CARE_PROVIDER_SITE_OTHER): Payer: No Typology Code available for payment source | Admitting: Physical Therapy

## 2020-12-22 ENCOUNTER — Encounter (HOSPITAL_COMMUNITY): Payer: No Typology Code available for payment source

## 2020-12-22 ENCOUNTER — Other Ambulatory Visit: Payer: Self-pay

## 2020-12-22 VITALS — BP 115/63 | HR 76

## 2020-12-22 DIAGNOSIS — M6281 Muscle weakness (generalized): Secondary | ICD-10-CM | POA: Diagnosis not present

## 2020-12-22 DIAGNOSIS — M545 Low back pain, unspecified: Secondary | ICD-10-CM | POA: Diagnosis not present

## 2020-12-22 DIAGNOSIS — R2689 Other abnormalities of gait and mobility: Secondary | ICD-10-CM

## 2020-12-22 NOTE — Therapy (Signed)
[facility] [place] [address] [place] [address] [place], [place], [address] Phone: [phone]   Fax:  [phone]  Physical Therapy Treatment  Patient Details  Name: Brandon [name] MRN: [id number] Date of Birth: 1956-01-28 Referring Provider (PT): Rodney Langton, MD   Encounter Date: 12/22/2020   PT End of Session - 12/22/20 1243    Visit Number 3    Number of Visits 12    Date for PT Re-Evaluation 01/23/21    Authorization Type UMR    PT Start Time 1231    PT Stop Time 1312    PT Time Calculation (min) 41 min    Activity Tolerance Patient tolerated treatment well    Behavior During Therapy [facility] for tasks assessed/performed           Past Medical History:  Diagnosis Date  . Anxiety   . Basal cell carcinoma (BCC) in situ of skin   . Colon polyps   . Coronary artery disease   . Depression   . Diabetes mellitus without complication (HCC)   . Hypertension   . SBO (small bowel obstruction) (HCC)     Past Surgical History:  Procedure Laterality Date  . BACK SURGERY    . CARDIAC CATHETERIZATION    . CATARACT EXTRACTION W/ INTRAOCULAR LENS  IMPLANT, BILATERAL    . CHOLECYSTECTOMY    . CHOLECYSTECTOMY, LAPAROSCOPIC    . CORONARY STENT INTERVENTION N/A 08/15/2020   Procedure: CORONARY STENT INTERVENTION;  Surgeon: Corky Crafts, MD;  Location: [facility] INVASIVE CV LAB;  Service: Cardiovascular;  Laterality: N/A;  . EYE SURGERY    . INTRAVASCULAR ULTRASOUND/IVUS N/A 08/15/2020   Procedure: Intravascular Ultrasound/IVUS;  Surgeon: Corky Crafts, MD;  Location: [facility] INVASIVE CV LAB;  Service: Cardiovascular;  Laterality: N/A;  . LEFT HEART CATH AND CORONARY ANGIOGRAPHY N/A 08/15/2020   Procedure: LEFT HEART CATH AND CORONARY ANGIOGRAPHY;  Surgeon: Corky Crafts, MD;  Location: [facility] INVASIVE CV LAB;  Service: Cardiovascular;  Laterality: N/A;  . LUMBAR FUSION     L3-5 with rods  . XI ROBOTIC ASSISTED INGUINAL HERNIA REPAIR  WITH MESH     x 3 surgeries    Vitals:   12/22/20 1244  BP: 115/63  Pulse: 76     Subjective Assessment - 12/22/20 1244    Subjective Pt reports no new changes since last visit.  He has done the stretches, but not the other exercises.    Pertinent History diabetes, HTN, h/o low back pain since 2006 (fell in the shower)-- has spinal cord stimulator;  coronary artery disease    Patient Stated Goals to be able to kneel and rise.    Currently in Pain? Yes    Pain Score 5     Pain Location Back    Pain Orientation Lower    Pain Descriptors / Indicators Aching    Aggravating Factors  prolonged standing, going over bumps in car    Pain Relieving Factors relaxing, sitting             [facility] PT Assessment - 12/22/20 0001      Assessment   Medical Diagnosis vertigo  "Vestibular rehabilitation for vertigo as well as gait training and low back strengthening for lumbar spondylosis."    Referring Provider (PT) Rodney Langton, MD    Onset Date/Surgical Date 11/28/20    Next MD Visit 12/26/20              Pt seen for aquatic therapy today.  Treatment  took place in water 3.25-4 ft in depth at the [facility] pool. Temp of water was 91.  Pt entered/exited the pool via steps independently with bilat rail.  Treatment:   Forward gait holding noodle.  Sit to stand with TA engaged x 5. Cues for form. Holding wall:  Side stepping and backward walking  Hip abdct x 10. Cues to keep toes pointing forward.  Hip ext x 10 each leg Heel/toe raises x 10 rocking back increases back pain. Calf stretches x 2 reps each side Tried single long sitting hamstring stretch on bench- too tight. Switched to foot on 2nd step with bilat UE on rails with improved tolerance.  2 reps of 20 sec each leg.    Pt requires buoyancy for support and to offload joints with strengthening exercises. Viscosity of the water is needed for resistance of strengthening; water current perturbations provides  challenge to standing balance unsupported, requiring increased core activation.     PT Long Term Goals - 12/12/20 1344      PT LONG TERM GOAL #1   Title The patient will return demo HEP for flexibility, lumbar mobility, and core stability.    Time 6    Period Weeks    Target Date 01/23/21      PT LONG TERM GOAL #2   Title The patient will demonstrate ability to reach to the ground (to pick up after dogs) and return to standing without lightheadedness.    Time 6    Period Weeks    Target Date 01/23/21      PT LONG TERM GOAL #3   Title The patient will improve hamstring flexibility R side to -30 from full extension (90/90 supine starting position).    Time 6    Period Weeks    Target Date 01/23/21      PT LONG TERM GOAL #4   Title The patient will be further assessed on Berg balance, and gait speed and goals to follow as indicated.    Time 6    Period Weeks    Target Date 01/23/21      PT LONG TERM GOAL #5   Title The patient will be able to move floor<>stand with UE support due to h/o falls.    Time 6    Period Weeks    Target Date 01/23/21                 Plan - 12/22/20 1410    Clinical Impression Statement Pt tolerated his aquatic therapy session well.  Minor cues given to engage core with sit to/from stand and LE kicks. BP seated on land (prior to entering water): was 127/70, HR 83, spO2 96%.  After 15 min of water exercise, BP was 115/63, HR 76. No symptoms of hypotension.   Pt reported reduced back pain to 3/10 at end of session.  Goals are ongoing.    Personal Factors and Comorbidities Comorbidity 3+    Comorbidities diabetes, HTN, h/o low back pain since 2006 (fell in the shower)-- has spinal cord stimulator; coronary artery disease    Examination-Activity Limitations Locomotion Level;Lift;Squat;Stand;Bend    Examination-Participation Restrictions Cleaning;Community Activity    Stability/Clinical Decision Making Stable/Uncomplicated    Rehab Potential Good     PT Frequency 2x / week    PT Duration 6 weeks    PT Treatment/Interventions Patient/family education;ADLs/Self Care Home Management;Aquatic Therapy;Neuromuscular re-education;Therapeutic exercise;Therapeutic activities;Gait training;Taping;Dry needling;Manual techniques;Vestibular    PT Next Visit Plan progress gait and LE strengthening,  lumbar mobility (h/o fusion) to tolerance, flexibility    Consulted and Agree with Plan of Care Patient           Patient will benefit from skilled therapeutic intervention in order to improve the following deficits and impairments:  Pain,Dizziness,Decreased range of motion,Decreased strength,Hypomobility,Impaired flexibility,Postural dysfunction,Decreased balance,Decreased activity tolerance  Visit Diagnosis: Other abnormalities of gait and mobility  Muscle weakness (generalized)  Bilateral low back pain without sciatica, unspecified chronicity     Problem List Patient Active Problem List   Diagnosis Date Noted  . Vertigo 11/28/2020  . Myelopathy (HCC) 11/28/2020  . Insomnia 09/03/2020  . Status post coronary artery stent placement   . Mixed hyperlipidemia   . Coronary artery disease involving native coronary artery of native heart with unstable angina pectoris (HCC)   . Chest pain 08/11/2020  . Facet arthropathy, lumbar 06/22/2020  . Major depression in partial remission (HCC) 06/04/2020  . Nausea and vomiting 04/14/2020  . Hyperlipidemia associated with type 2 diabetes mellitus (HCC) 04/14/2020  . Primary hypertension 04/07/2020  . Type 2 diabetes mellitus with other specified complication (HCC) 04/07/2020  . SBO (small bowel obstruction) (HCC) 04/06/2020  . Sepsis (HCC) 10/06/2019  . Status post insertion of spinal cord stimulator 05/19/2017  . 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 Erectile dysfunction 10/09/2015   Mayer Camel, PTA 12/22/20 2:21 PM  [facility] [place] [address] [place] [address] [place], [place], [address] Phone: [phone]   Fax:  [phone]  Name: Brandon [name] MRN: [id number] Date of Birth: [birth year]

## 2020-12-25 ENCOUNTER — Other Ambulatory Visit: Payer: Self-pay | Admitting: Sports Medicine

## 2020-12-25 ENCOUNTER — Encounter (HOSPITAL_COMMUNITY)
Admission: RE | Admit: 2020-12-25 | Discharge: 2020-12-25 | Disposition: A | Discharge status: Home / Self Care | Payer: No Typology Code available for payment source | Source: Ambulatory Visit | Attending: Cardiology | Admitting: Cardiology

## 2020-12-25 ENCOUNTER — Other Ambulatory Visit: Payer: Self-pay

## 2020-12-25 ENCOUNTER — Other Ambulatory Visit (HOSPITAL_COMMUNITY): Payer: Self-pay

## 2020-12-25 DIAGNOSIS — Z955 Presence of coronary angioplasty implant and graft: Secondary | ICD-10-CM | POA: Insufficient documentation

## 2020-12-25 DIAGNOSIS — M961 Postlaminectomy syndrome, not elsewhere classified: Secondary | ICD-10-CM

## 2020-12-25 MED ORDER — TIZANIDINE HCL 4 MG PO TABS
4.0000 mg | ORAL_TABLET | Freq: Three times a day (TID) | ORAL | 10 refills | Status: DC | PRN
  Filled 2020-12-25: qty 90, 30d supply, fill #0

## 2020-12-26 ENCOUNTER — Ambulatory Visit (INDEPENDENT_AMBULATORY_CARE_PROVIDER_SITE_OTHER): Payer: No Typology Code available for payment source | Admitting: Physical Therapy

## 2020-12-26 ENCOUNTER — Ambulatory Visit (INDEPENDENT_AMBULATORY_CARE_PROVIDER_SITE_OTHER): Payer: No Typology Code available for payment source | Admitting: Sports Medicine

## 2020-12-26 ENCOUNTER — Other Ambulatory Visit (HOSPITAL_COMMUNITY): Payer: Self-pay

## 2020-12-26 ENCOUNTER — Telehealth: Payer: Self-pay | Admitting: *Deleted

## 2020-12-26 VITALS — HR 96

## 2020-12-26 DIAGNOSIS — M545 Low back pain, unspecified: Secondary | ICD-10-CM

## 2020-12-26 DIAGNOSIS — M47816 Spondylosis without myelopathy or radiculopathy, lumbar region: Secondary | ICD-10-CM | POA: Diagnosis not present

## 2020-12-26 DIAGNOSIS — R2689 Other abnormalities of gait and mobility: Secondary | ICD-10-CM

## 2020-12-26 DIAGNOSIS — G959 Disease of spinal cord, unspecified: Secondary | ICD-10-CM | POA: Diagnosis not present

## 2020-12-26 DIAGNOSIS — M6281 Muscle weakness (generalized): Secondary | ICD-10-CM

## 2020-12-26 DIAGNOSIS — I25110 Atherosclerotic heart disease of native coronary artery with unstable angina pectoris: Secondary | ICD-10-CM

## 2020-12-26 MED ORDER — TIZANIDINE HCL 4 MG PO TABS
8.0000 mg | ORAL_TABLET | Freq: Three times a day (TID) | ORAL | 11 refills | Status: DC
  Filled 2020-12-26: qty 180, 30d supply, fill #0
  Filled 2021-01-29: qty 180, 30d supply, fill #1
  Filled 2021-03-06: qty 180, 30d supply, fill #2
  Filled 2021-04-21: qty 180, 30d supply, fill #3
  Filled 2021-06-01: qty 180, 30d supply, fill #4
  Filled 2021-07-09: qty 180, 30d supply, fill #5
  Filled 2021-08-29: qty 180, 30d supply, fill #6
  Filled 2021-09-26: qty 180, 30d supply, fill #7

## 2020-12-26 MED ORDER — TIZANIDINE HCL 4 MG PO CAPS
8.0000 mg | ORAL_CAPSULE | Freq: Three times a day (TID) | ORAL | 11 refills | Status: DC
  Filled 2020-12-26: qty 180, 30d supply, fill #0

## 2020-12-26 NOTE — Addendum Note (Signed)
Addended by: Monica Becton on: 12/26/2020 11:38 AM   Modules accepted: Orders

## 2020-12-26 NOTE — Therapy (Addendum)
[facility] [place] [address] [place], [place], [address] Phone: [phone]   Fax:  [phone]  Physical Therapy Treatment  Patient Details  Name: Brandon [name] MRN: [id number] Date of Birth: 11-05-1955 Referring Provider (PT): Rodney Langton, MD   Encounter Date: 12/26/2020   PT End of Session - 12/26/20 1158    Visit Number 4    Number of Visits 12    Date for PT Re-Evaluation 01/23/21    Authorization Type UMR    PT Start Time 1150    PT Stop Time 1233    PT Time Calculation (min) 43 min    Activity Tolerance Patient tolerated treatment well    Behavior During Therapy [facility] for tasks assessed/performed           Past Medical History:  Diagnosis Date  . Anxiety   . Basal cell carcinoma (BCC) in situ of skin   . Colon polyps   . Coronary artery disease   . Depression   . Diabetes mellitus without complication (HCC)   . Hypertension   . SBO (small bowel obstruction) (HCC)     Past Surgical History:  Procedure Laterality Date  . BACK SURGERY    . CARDIAC CATHETERIZATION    . CATARACT EXTRACTION W/ INTRAOCULAR LENS  IMPLANT, BILATERAL    . CHOLECYSTECTOMY    . CHOLECYSTECTOMY, LAPAROSCOPIC    . CORONARY STENT INTERVENTION N/A 08/15/2020   Procedure: CORONARY STENT INTERVENTION;  Surgeon: Corky Crafts, MD;  Location: [facility] INVASIVE CV LAB;  Service: Cardiovascular;  Laterality: N/A;  . EYE SURGERY    . INTRAVASCULAR ULTRASOUND/IVUS N/A 08/15/2020   Procedure: Intravascular Ultrasound/IVUS;  Surgeon: Corky Crafts, MD;  Location: [facility] INVASIVE CV LAB;  Service: Cardiovascular;  Laterality: N/A;  . LEFT HEART CATH AND CORONARY ANGIOGRAPHY N/A 08/15/2020   Procedure: LEFT HEART CATH AND CORONARY ANGIOGRAPHY;  Surgeon: Corky Crafts, MD;  Location: [facility] INVASIVE CV LAB;  Service: Cardiovascular;  Laterality: N/A;  . LUMBAR FUSION     L3-5 with rods  . XI ROBOTIC ASSISTED INGUINAL HERNIA REPAIR  WITH MESH     x 3 surgeries    Vitals:   12/26/20 1153  Pulse: 96  SpO2: 98%     Subjective Assessment - 12/26/20 1153    Subjective Pt reports the doctor  has ordered 2 MRI's - one for cervical and thoracic.  He made some changes to medication dosage. He states he was tired after aquatic therapy session.    Pertinent History diabetes, HTN, h/o low back pain since 2006 (fell in the shower)-- has spinal cord stimulator;  coronary artery disease    Patient Stated Goals to be able to kneel and rise.    Currently in Pain? Yes    Pain Score 4     Pain Location Back    Pain Orientation Lower    Pain Descriptors / Indicators Sore    Aggravating Factors  walking >800 yds, going over bumps in car    Pain Relieving Factors sitting.              [facility] PT Assessment - 12/26/20 0001      Assessment   Medical Diagnosis vertigo  "Vestibular rehabilitation for vertigo as well as gait training and low back strengthening for lumbar spondylosis."    Referring Provider (PT) Rodney Langton, MD    Onset Date/Surgical Date 11/28/20    Next MD Visit after MRI.  [facility] Adult PT Treatment/Exercise - 12/26/20 0001      Lumbar Exercises: Stretches   Passive Hamstring Stretch Right;3 reps;Left;2 reps;30 seconds   seated, with/without foot on stool   Hip Flexor Stretch Right;Left;2 reps;20 seconds   seated, leg back.  2nd rep with arm overhead   Piriformis Stretch Right;2 reps;Left;1 rep;20 seconds   modified pigeon pose on table   Gastroc Stretch Right;Left;2 reps;30 seconds    Other Lumbar Stretch Exercise Thoracic rotation with forearms on wall x 5 reps each direciton      Lumbar Exercises: Standing   Wall Slides 10 reps   quarter squat. cues for form     Lumbar Exercises: Seated   Sit to Stand 5 reps   without support, slow and controlled.     Knee/Hip Exercises: Aerobic   Nustep L5: 6 min for warm up; arms and legs.         Rhomberg (in corner) x 10 sec, 20 sec, 20 sec  - no LOB.     PT Long Term Goals - 12/12/20 1344      PT LONG TERM GOAL #1   Title The patient will return demo HEP for flexibility, lumbar mobility, and core stability.    Time 6    Period Weeks    Target Date 01/23/21      PT LONG TERM GOAL #2   Title The patient will demonstrate ability to reach to the ground (to pick up after dogs) and return to standing without lightheadedness.    Time 6    Period Weeks    Target Date 01/23/21      PT LONG TERM GOAL #3   Title The patient will improve hamstring flexibility R side to -30 from full extension (90/90 supine starting position).    Time 6    Period Weeks    Target Date 01/23/21      PT LONG TERM GOAL #4   Title The patient will be further assessed on Berg balance, and gait speed and goals to follow as indicated.    Time 6    Period Weeks    Target Date 01/23/21      PT LONG TERM GOAL #5   Title The patient will be able to move floor<>stand with UE support due to h/o falls.    Time 6    Period Weeks    Target Date 01/23/21                 Plan - 12/26/20 1212    Clinical Impression Statement Pt's Rt hip is tighter than Lt.  Modified hamstring stretch to where his foot is on floor with improved tolerance. Pt reported no change in back pain during exercises.  HR elevated to 96bpm after NuStep and sit to/from stand, but he had no symptoms of hypotension during session.  Goals are ongoing.    Personal Factors and Comorbidities Comorbidity 3+    Comorbidities diabetes, HTN, h/o low back pain since 2006 (fell in the shower)-- has spinal cord stimulator; coronary artery disease    Examination-Activity Limitations Locomotion Level;Lift;Squat;Stand;Bend    Examination-Participation Restrictions Cleaning;Community Activity    Stability/Clinical Decision Making Stable/Uncomplicated    Rehab Potential Good    PT Frequency 2x / week    PT Duration 6 weeks    PT Treatment/Interventions Patient/family education;ADLs/Self Care  Home Management;Aquatic Therapy;Neuromuscular re-education;Therapeutic exercise;Therapeutic activities;Gait training;Taping;Dry needling;Manual techniques;Vestibular    PT Next Visit Plan progress gait and LE strengthening, lumbar mobility (h/o  fusion) to tolerance, flexibility    PT Home Exercise Plan [id number]    Consulted and Agree with Plan of Care Patient           Patient will benefit from skilled therapeutic intervention in order to improve the following deficits and impairments:  Pain,Dizziness,Decreased range of motion,Decreased strength,Hypomobility,Impaired flexibility,Postural dysfunction,Decreased balance,Decreased activity tolerance  Visit Diagnosis: Other abnormalities of gait and mobility  Muscle weakness (generalized)  Bilateral low back pain without sciatica, unspecified chronicity     Problem List Patient Active Problem List   Diagnosis Date Noted  . Vertigo 11/28/2020  . Myelopathy (HCC) 11/28/2020  . Insomnia 09/03/2020  . Status post coronary artery stent placement   . Mixed hyperlipidemia   . Coronary artery disease involving native coronary artery of native heart with unstable angina pectoris (HCC)   . Chest pain 08/11/2020  . Facet arthropathy, lumbar 06/22/2020  . Major depression in partial remission (HCC) 06/04/2020  . Nausea and vomiting 04/14/2020  . Hyperlipidemia associated with type 2 diabetes mellitus (HCC) 04/14/2020  . Primary hypertension 04/07/2020  . Type 2 diabetes mellitus with other specified complication (HCC) 04/07/2020  . SBO (small bowel obstruction) (HCC) 04/06/2020  . Sepsis (HCC) 10/06/2019  . Status post insertion of spinal cord stimulator 05/19/2017  . 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 Erectile dysfunction 10/09/2015   Mayer Camel, PTA 12/26/20 12:41 PM  [facility] [place] [address] [place] [address] [place], [place], [address] Phone: [phone]   Fax:  [phone]  Name:  Brandon [name] MRN: [id number] Date of Birth: [birth year]

## 2020-12-26 NOTE — Telephone Encounter (Signed)
Patient is scheduled for lab study on 02-04-21. Patient understands his sleep study will be done at [facility] sleep lab. Patient understands he will receive a sleep packet in a week or so. Patient understands to call if he does not receive the sleep packet in a timely manner.  Left detailed message on voicemail with date and time of titration and informed patient to call back to confirm or reschedule.

## 2020-12-26 NOTE — Progress Notes (Signed)
    Procedures performed today:    None.  Independent interpretation of notes and tests performed by another provider:   None.  Brief History, Exam, Impression, and Recommendations:    Facet arthropathy, lumbar Tandre has a history of back surgery with persistent pain, multiple interventions including spinal cord stimulator placement, bilateral SI joint pain, bilateral injections performed by me at the end of last year with 24 hours of relief, new MRI shows severe bilateral L5-S1 facet arthritis, we were trying to get facet joint injections approved but his insurance denied this.   Myelopathy (HCC) Progressive weakness, lumbar spine was imaged, we still need his cervical and thoracic MRIs looking for myelopathy. Currently doing aquatic therapy.   Needs tizanidine increased.    ___________________________________________ Ihor Austin. Benjamin Stain, M.D., ABFM., CAQSM. Primary Care and Sports Medicine [facility]  Adjunct Instructor of Family Medicine  [employer]

## 2020-12-26 NOTE — Assessment & Plan Note (Signed)
Progressive weakness, lumbar spine was imaged, we still need his cervical and thoracic MRIs looking for myelopathy. Currently doing aquatic therapy.   Needs tizanidine increased.

## 2020-12-26 NOTE — Telephone Encounter (Signed)
-----   Message from Loa Socks, LPN sent at 2/48/2500  9:32 AM EDT ----- Regarding: SPLIT NIGHT SLEEP STUDY DONE IN LAB PER [place] Dr. Shari Prows saw this pt in clinic today.  He already has OSA and has an old recalled CPAP machine from where he lived in [place].  He needs a new sleep study for OSA to replace recalled CPAP.  Order is in the system and pt is aware you will call to arrange.  Can you arrange and let me know?   Thanks, [employer]

## 2020-12-26 NOTE — Assessment & Plan Note (Signed)
Holley has a history of back surgery with persistent pain, multiple interventions including spinal cord stimulator placement, bilateral SI joint pain, bilateral injections performed by me at the end of last year with 24 hours of relief, new MRI shows severe bilateral L5-S1 facet arthritis, we were trying to get facet joint injections approved but his insurance denied this.

## 2020-12-27 ENCOUNTER — Encounter (HOSPITAL_COMMUNITY): Payer: No Typology Code available for payment source

## 2020-12-27 ENCOUNTER — Other Ambulatory Visit (HOSPITAL_COMMUNITY): Payer: Self-pay

## 2020-12-28 ENCOUNTER — Other Ambulatory Visit (HOSPITAL_COMMUNITY): Payer: Self-pay

## 2020-12-29 ENCOUNTER — Encounter: Payer: Self-pay | Admitting: Physical Therapy

## 2020-12-29 ENCOUNTER — Other Ambulatory Visit: Payer: Self-pay

## 2020-12-29 ENCOUNTER — Encounter (HOSPITAL_COMMUNITY)
Admission: RE | Admit: 2020-12-29 | Discharge: 2020-12-29 | Disposition: A | Discharge status: Home / Self Care | Payer: No Typology Code available for payment source | Source: Ambulatory Visit | Attending: Cardiology | Admitting: Cardiology

## 2020-12-29 ENCOUNTER — Ambulatory Visit (INDEPENDENT_AMBULATORY_CARE_PROVIDER_SITE_OTHER): Payer: No Typology Code available for payment source | Admitting: Physical Therapy

## 2020-12-29 DIAGNOSIS — M545 Low back pain, unspecified: Secondary | ICD-10-CM

## 2020-12-29 DIAGNOSIS — M6281 Muscle weakness (generalized): Secondary | ICD-10-CM | POA: Diagnosis not present

## 2020-12-29 DIAGNOSIS — Z955 Presence of coronary angioplasty implant and graft: Secondary | ICD-10-CM

## 2020-12-29 DIAGNOSIS — R2689 Other abnormalities of gait and mobility: Secondary | ICD-10-CM

## 2020-12-29 NOTE — Therapy (Signed)
[facility] [place] [address]  [address] [place], [place], [address] Phone: [phone]   Fax:  [phone]  Physical Therapy Treatment  Patient Details  Name: Brandon [name] MRN: [id number] Date of Birth: February 11, 1956 Referring Provider (PT): Rodney Langton, MD   Encounter Date: 12/29/2020   PT End of Session - 12/29/20 1320    Visit Number 5    Number of Visits 12    Date for PT Re-Evaluation 01/23/21    Authorization Type UMR    PT Start Time 1315    PT Stop Time 1359    PT Time Calculation (min) 44 min    Activity Tolerance Patient tolerated treatment well    Behavior During Therapy [facility] for tasks assessed/performed           Past Medical History:  Diagnosis Date  . Anxiety   . Basal cell carcinoma (BCC) in situ of skin   . Colon polyps   . Coronary artery disease   . Depression   . Diabetes mellitus without complication (HCC)   . Hypertension   . SBO (small bowel obstruction) (HCC)     Past Surgical History:  Procedure Laterality Date  . BACK SURGERY    . CARDIAC CATHETERIZATION    . CATARACT EXTRACTION W/ INTRAOCULAR LENS  IMPLANT, BILATERAL    . CHOLECYSTECTOMY    . CHOLECYSTECTOMY, LAPAROSCOPIC    . CORONARY STENT INTERVENTION N/A 08/15/2020   Procedure: CORONARY STENT INTERVENTION;  Surgeon: Corky Crafts, MD;  Location: [facility] INVASIVE CV LAB;  Service: Cardiovascular;  Laterality: N/A;  . EYE SURGERY    . INTRAVASCULAR ULTRASOUND/IVUS N/A 08/15/2020   Procedure: Intravascular Ultrasound/IVUS;  Surgeon: Corky Crafts, MD;  Location: [facility] INVASIVE CV LAB;  Service: Cardiovascular;  Laterality: N/A;  . LEFT HEART CATH AND CORONARY ANGIOGRAPHY N/A 08/15/2020   Procedure: LEFT HEART CATH AND CORONARY ANGIOGRAPHY;  Surgeon: Corky Crafts, MD;  Location: [facility] INVASIVE CV LAB;  Service: Cardiovascular;  Laterality: N/A;  . LUMBAR FUSION     L3-5 with rods  . XI ROBOTIC ASSISTED INGUINAL HERNIA REPAIR  WITH MESH     x 3 surgeries    There were no vitals filed for this visit.   Subjective Assessment - 12/29/20 1320    Subjective Pt reports he is feeling pretty good.  He had cardiac rehab this morning, included recumbent bike for 30 min, lifting 4# wts and UE/LE stretches. He is still exploring pool options for continuation of aquatic exercising after d/c.    Currently in Pain? Yes    Pain Score 2     Pain Location Back    Pain Orientation Lower    Pain Descriptors / Indicators Aching    Aggravating Factors  walking >800 yds, goiing over bumps in car    Pain Relieving Factors sitting.              [facility] PT Assessment - 12/29/20 0001      Assessment   Medical Diagnosis vertigo  "Vestibular rehabilitation for vertigo as well as gait training and low back strengthening for lumbar spondylosis."    Referring Provider (PT) Rodney Langton, MD    Onset Date/Surgical Date 11/28/20    Next MD Visit after MRI.      Observation/Other Assessments   Focus on Therapeutic Outcomes (FOTO)  --          Pt seen for aquatic therapy today.  Treatment took place in water 3.25-4 ft in depth at  the [facility] pool. Temp of water was 91.  Pt entered/exited the pool via stairs independently with bilat rail.  Treatment:   Holding on to side of pool:  Forward gait, side stepping (with cues to increase knee flexion instead of penguin walk), retro gait.  Heel raises x 20.    Calf stretch x 30 sec x 2 reps each LE.   Hip ext x 10 reps each side.  Hip abdct x 10 each leg.  Leg press front and side with foot on noodle x 6 reps each direction.   Squats pushing yellow floatation dumbell x 12, allowing heels to rise with knee bends.  Slight knee bend in squat with forward row with yellow dumbbells x  12, cues for core engagement.  Ai chi postures:  Floating, uplifting, enclosing, soothing, gathering - x 10 of each.    Pt requires buoyancy for support and to offload joints with  strengthening exercises. Viscosity of the water is needed for resistance of strengthening; water current perturbations provides challenge to standing balance unsupported, requiring increased core activation.     PT Long Term Goals - 12/12/20 1344      PT LONG TERM GOAL #1   Title The patient will return demo HEP for flexibility, lumbar mobility, and core stability.    Time 6    Period Weeks    Target Date 01/23/21      PT LONG TERM GOAL #2   Title The patient will demonstrate ability to reach to the ground (to pick up after dogs) and return to standing without lightheadedness.    Time 6    Period Weeks    Target Date 01/23/21      PT LONG TERM GOAL #3   Title The patient will improve hamstring flexibility R side to -30 from full extension (90/90 supine starting position).    Time 6    Period Weeks    Target Date 01/23/21      PT LONG TERM GOAL #4   Title The patient will be further assessed on Berg balance, and gait speed and goals to follow as indicated.    Time 6    Period Weeks    Target Date 01/23/21      PT LONG TERM GOAL #5   Title The patient will be able to move floor<>stand with UE support due to h/o falls.    Time 6    Period Weeks    Target Date 01/23/21                 Plan - 12/29/20 1355    Clinical Impression Statement Pt reported increase in low back pain, up to 4/10 with squats pushing dumbbell under the water.  Returned to baseline of 2/10 with change in exercise and depth of water.  Leg press with square shaped pool noodle was "intense", but tolerable.  Pt progressing well towards LTGs.    Personal Factors and Comorbidities Comorbidity 3+    Comorbidities diabetes, HTN, h/o low back pain since 2006 (fell in the shower)-- has spinal cord stimulator; coronary artery disease    Examination-Activity Limitations Locomotion Level;Lift;Squat;Stand;Bend    Examination-Participation Restrictions Cleaning;Community Activity    Stability/Clinical Decision  Making Stable/Uncomplicated    Rehab Potential Good    PT Frequency 2x / week    PT Duration 6 weeks    PT Treatment/Interventions Patient/family education;ADLs/Self Care Home Management;Aquatic Therapy;Neuromuscular re-education;Therapeutic exercise;Therapeutic activities;Gait training;Taping;Dry needling;Manual techniques;Vestibular    PT Next Visit Plan progress  gait and LE strengthening, lumbar mobility (h/o fusion) to tolerance, flexibility    PT Home Exercise Plan [id number]    Consulted and Agree with Plan of Care Patient           Patient will benefit from skilled therapeutic intervention in order to improve the following deficits and impairments:  Pain,Dizziness,Decreased range of motion,Decreased strength,Hypomobility,Impaired flexibility,Postural dysfunction,Decreased balance,Decreased activity tolerance  Visit Diagnosis: Other abnormalities of gait and mobility  Muscle weakness (generalized)  Bilateral low back pain without sciatica, unspecified chronicity     Problem List Patient Active Problem List   Diagnosis Date Noted  . Vertigo 11/28/2020  . Myelopathy (HCC) 11/28/2020  . Insomnia 09/03/2020  . Status post coronary artery stent placement   . Mixed hyperlipidemia   . Coronary artery disease involving native coronary artery of native heart with unstable angina pectoris (HCC)   . Chest pain 08/11/2020  . Facet arthropathy, lumbar 06/22/2020  . Major depression in partial remission (HCC) 06/04/2020  . Nausea and vomiting 04/14/2020  . Hyperlipidemia associated with type 2 diabetes mellitus (HCC) 04/14/2020  . Primary hypertension 04/07/2020  . Type 2 diabetes mellitus with other specified complication (HCC) 04/07/2020  . SBO (small bowel obstruction) (HCC) 04/06/2020  . Sepsis (HCC) 10/06/2019  . Status post insertion of spinal cord stimulator 05/19/2017  . 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 Erectile dysfunction 10/09/2015   Mayer Camel, PTA 12/29/20  2:47 PM  [facility] [place] [address] [place] [address] [place], [place], [address] Phone: [phone]   Fax:  [phone]  Name: Brandon [name] MRN: [id number] Date of Birth: 1956-02-03

## 2021-01-01 ENCOUNTER — Encounter (HOSPITAL_COMMUNITY)
Admission: RE | Admit: 2021-01-01 | Discharge: 2021-01-01 | Disposition: A | Discharge status: Home / Self Care | Payer: No Typology Code available for payment source | Source: Ambulatory Visit | Attending: Cardiology | Admitting: Cardiology

## 2021-01-01 ENCOUNTER — Other Ambulatory Visit: Payer: Self-pay

## 2021-01-01 ENCOUNTER — Other Ambulatory Visit (HOSPITAL_COMMUNITY): Payer: Self-pay

## 2021-01-01 DIAGNOSIS — Z955 Presence of coronary angioplasty implant and graft: Secondary | ICD-10-CM

## 2021-01-01 NOTE — H&P (View-Only) (Signed)
Cardiology Office Note:    Date:  01/02/2021   ID:  Brandon [name], DOB 07/22/1956, MRN [id number]  PCP:  Matthews, Cody, DO   [facility] Providers Cardiologist:  Heather E Pemberton, MD     Referring MD: Matthews, Cody, DO   Chief Complaint:  Follow-up (CAD with angina)    Patient Profile:    Brandon [name] is a 65 y.o. male with:   Coronary artery disease   S/p DES to mRCA in 12/21  Residual small vessel dz in RPDA, OM1, OM2, D1 and apical LAD; pLCx 75 (target for PCI if +Symptoms)  Chest pain post PCI >> tx briefly with Colchicine but later DCd due to no symptoms   Hypertension   Hyperlipidemia  Diabetes mellitus   OSA  Obesity   Prior CV studies: LEFT HEART CATH 08/15/2020 Narrative  Mid RCA lesion is 90% stenosed.  Prox Cx lesion is 75% stenosed.  2nd Mrg lesion is 75% stenosed.  1st Mrg lesion is 100% stenosed. Faint right to left and left to left collaterals.  RPDA lesion is 70% stenosed.  2nd Diag-1 lesion is 75% stenosed.  2nd Diag-2 lesion is 75% stenosed.  Dist LAD lesion is 50% stenosed.  A drug-eluting stent was successfully placed using a STENT RESOLUTE ONYX 4.0X38, optimized with IVUS.  Post intervention, there is a 0% residual stenosis.  LV end diastolic pressure is normal.  There is no aortic valve stenosis.  75 cm long sheath needed for support due to tortuosity in the right subclavian to engage the RCA. Left coronary could be engaged easily with short sheath. Diffuse multivessel disease, but no LAD involvement. RCA successfully treated.  Small vessel disease noted in the RPDA, OM1 and OM2, second diagonal and apical LAD.  Proximal circumflex would be a potential target for PCI if he had more symptoms.   Echocardiogram 08/13/20 EF 60-65, no RWMA, mild LVH, Gr 1 DD   History of Present Illness: Brandon [name] was last seen by Dr. Pemberton 12/04/20.  He was having symptoms of and shortness of breath.  He was started on  Isosorbide and his beta-blocker was changed to Metoprolol succinate.  He returns for f/u with an eye towards cardiac catheterization if he has persistent angina.    He is here alone today.  He continues to have chest pain and shortness of breath with exertion.  His symptoms have not improved. He has not had any rest symptoms.  He has not had to take any NTG.  He has substernal heaviness/burning.  He has not had any radiation or associated diaphoresis.  He has not had orthopnea, syncope.  He does have some mild LE edema.  His endocrinologist stopped Actos but it has not improved.          Past Medical History:  Diagnosis Date  . Anxiety   . Basal cell carcinoma (BCC) in situ of skin   . Colon polyps   . Coronary artery disease   . Depression   . Diabetes mellitus without complication (HCC)   . Hypertension   . SBO (small bowel obstruction) (HCC)     Current Medications: Current Meds  Medication Sig  . amLODipine (NORVASC) 10 MG tablet Take 10 mg by mouth every morning.  . ARIPiprazole (ABILIFY) 10 MG tablet Take 10 mg by mouth every morning.  . aspirin 81 MG EC tablet Take 81 mg by mouth every morning.  . atorvastatin (LIPITOR) 80 MG tablet TAKE 1 TABLET (80 MG TOTAL)   BY MOUTH DAILY.  Marland Kitchen Blood Glucose Monitoring Suppl (FREESTYLE LITE) w/Device KIT USE AS DIRECTED.  Marland Kitchen Blood Pressure Monitoring (OMRON 3 SERIES BP MONITOR) DEVI USE AS DIRECTED.  Marland Kitchen buPROPion (WELLBUTRIN XL) 150 MG 24 hr tablet TAKE 1 TABLET BY MOUTH ONCE DAILY  . buPROPion (WELLBUTRIN XL) 300 MG 24 hr tablet TAKE 1 TABLET BY MOUTH ONCE DAILY  . clopidogrel (PLAVIX) 75 MG tablet TAKE 1 TABLET (75 MG TOTAL) BY MOUTH DAILY WITH BREAKFAST.  Marland Kitchen Continuous Blood Gluc Sensor (DEXCOM G6 SENSOR) MISC Use as directed. Change sensor every 10 days  . empagliflozin (JARDIANCE) 25 MG TABS tablet TAKE 1 TABLET BY MOUTH ONCE DAILY  . glucose blood test strip USE 1 TO CHECK BLOOD GLUCOSE UP TO 5 TIMES DAILY.  Marland Kitchen insulin degludec (TRESIBA  FLEXTOUCH) 100 UNIT/ML FlexTouch Pen Inject 50 Units into the skin daily.  . insulin lispro (HUMALOG) 100 UNIT/ML KwikPen Inject 10-24 Units into the skin 3 (three) times daily before meals.  . isosorbide mononitrate (IMDUR) 30 MG 24 hr tablet Take 1 tablet (30 mg total) by mouth daily.  . Lancets (FREESTYLE) lancets USE 1 TO CHECK BLOOD GLUCOSE UP TO 5 TIMES DAILY.  Marland Kitchen losartan (COZAAR) 25 MG tablet Take 1 tablet (25 mg total) by mouth daily. Take only when systolic BP greater than 237.  . metoCLOPramide (REGLAN) 10 MG tablet Take 10 mg by mouth daily as needed for nausea or vomiting (upset stomach). PRN  . metoprolol succinate (TOPROL-XL) 50 MG 24 hr tablet Take 1 tablet (50 mg total) by mouth daily. Take with or immediately following a meal.  . nitroGLYCERIN (NITROSTAT) 0.4 MG SL tablet Place 1 tablet (0.4 mg total) under the tongue every 5 (five) minutes as needed for chest pain.  Marland Kitchen ondansetron (ZOFRAN ODT) 4 MG disintegrating tablet Take 1 tablet (4 mg total) by mouth every 8 (eight) hours as needed for nausea or vomiting.  . pantoprazole (PROTONIX) 40 MG tablet TAKE 1 TABLET BY MOUTH ONCE DAILY  . tamsulosin (FLOMAX) 0.4 MG CAPS capsule TAKE 1 CAPSULE BY MOUTH ONCE DAILY.  Marland Kitchen testosterone cypionate (DEPOTESTOSTERONE CYPIONATE) 200 MG/ML injection Inject 1 mL (200 mg total) into the muscle every Sunday.  Marland Kitchen tiZANidine (ZANAFLEX) 4 MG tablet Take 2 tablets (8 mg total) by mouth 3 (three) times daily.  . traMADol (ULTRAM) 50 MG tablet TAKE 2 TABLETS (100 MG TOTAL) BY MOUTH 3 (THREE) TIMES DAILY AS NEEDED.  Marland Kitchen triazolam (HALCION) 0.25 MG tablet TAKE 1-2 TABS BY MOUTH 2 HOURS BEFORE PROCEDURE OR IMAGING.DO NOT DRIVE WITH THIS MEDICATION.     Allergies:   Kiwi extract, Other, Penicillins, Ancef [cefazolin], Latex, and Levaquin [levofloxacin]   Social History   Tobacco Use  . Smoking status: Former Smoker    Quit date: 2007    Years since quitting: 15.3  . Smokeless tobacco: Never Used   Substance Use Topics  . Alcohol use: Not Currently  . Drug use: Not Currently     Family Hx: The patient's family history includes Anxiety disorder in his father and mother; COPD in his mother; Cancer in his father; Depression in his father and mother; Other in his father. There is no history of Diabetes.  Review of Systems  Gastrointestinal: Negative for hematochezia and melena.  Genitourinary: Negative for hematuria.     EKGs/Labs/Other Test Reviewed:    EKG:  EKG is  Not  ordered today.  The ekg ordered today demonstrates n/a  Recent Labs: 04/16/2020: Magnesium 1.9 08/13/2020:  TSH 2.345 10/23/2020: ALT 12 01/02/2021: BUN 12; Creatinine, Ser 0.86; Hemoglobin WILL FOLLOW; Platelets WILL FOLLOW; Potassium 4.0; Sodium 140   Recent Lipid Panel Lab Results  Component Value Date/Time   CHOL 113 10/23/2020 08:24 AM   TRIG 74 10/23/2020 08:24 AM   HDL 45 10/23/2020 08:24 AM   CHOLHDL 2.5 10/23/2020 08:24 AM   CHOLHDL 5.0 08/13/2020 01:19 AM   LDLCALC 53 10/23/2020 08:24 AM     Risk Assessment/Calculations:      Physical Exam:    VS:  BP (!) 162/60   Pulse 94   Ht 5' 9" (1.753 m)   Wt 243 lb 9.6 oz (110.5 kg)   SpO2 96%   BMI 35.97 kg/m     Wt Readings from Last 3 Encounters:  01/02/21 243 lb 9.6 oz (110.5 kg)  12/05/20 242 lb 3.2 oz (109.9 kg)  12/04/20 240 lb 3.2 oz (109 kg)     Constitutional:      Appearance: Healthy appearance. Not in distress.  Neck:     Vascular: JVD normal.  Pulmonary:     Effort: Pulmonary effort is normal.     Breath sounds: No wheezing. No rales.  Cardiovascular:     Normal rate. Regular rhythm. Normal S1. Normal S2.     Murmurs: There is no murmur.  Edema:    Pretibial: bilateral trace edema of the pretibial area. Abdominal:     Palpations: Abdomen is soft. There is no hepatomegaly.  Skin:    General: Skin is warm and dry.  Neurological:     General: No focal deficit present.     Mental Status: Alert and oriented to person,  place and time.     Cranial Nerves: Cranial nerves are intact.          ASSESSMENT & PLAN:    1. Coronary artery disease of native artery of native heart with stable angina pectoris (HCC) S/p DES to mRCA in 12/21.  He has residual mod disease the LCx that can be approached with PCI if he has refractory symptoms.  He also has small vessel disease in the RPDA, OM1, OM2, D2 and apical LAD.  He has symptoms of CCS Class II-III angina that is refractory to 3 antianginal drugs.  He has not had any improvement with recent med changes.  I have recommended proceeding with cardiac catheterization with an eye towards PCI of the LCx.  I have reviewed this with Dr. Pemberton who agrees.  Continue current dose of Amlodipine, Isosorbide, Metoprolol succinate, ASA, Clopidogrel, Atorvastatin.  F/u after cardiac catheterization.   2. Essential hypertension BP elevated today.  He has not taken any meds yet.  When he takes all his BP meds, his BP drops to 115-120 systolic.  He feels weak with this.  I have asked him to break up his meds and take 2 in the AM and 2 in the PM.  I will not adjust his meds further at this time.  Of note, he does have leg edema and we may need to reduce his Amlodipine at some point in the future.    3. Mixed hyperlipidemia Continue high intensity statin Rx.      Shared Decision Making/Informed Consent The risks [stroke (1 in 1000), death (1 in 1000), kidney failure [usually temporary] (1 in 500), bleeding (1 in 200), allergic reaction [possibly serious] (1 in 200)], benefits (diagnostic support and management of coronary artery disease) and alternatives of a cardiac catheterization were discussed in detail with Mr.   [name] and he is willing to proceed.   Dispo:  Return in about 4 weeks (around 01/30/2021) for Post Procedure Follow Up.   Medication Adjustments/Labs and Tests Ordered: Current medicines are reviewed at length with the patient today.  Concerns regarding medicines are  outlined above.  Tests Ordered: Orders Placed This Encounter  Procedures  . Basic metabolic panel  . CBC   Medication Changes: No orders of the defined types were placed in this encounter.   Signed, Richardson Dopp, PA-C  01/02/2021 5:47 PM    [facility] [place], [place], [place]  [address] Phone: [phone]; Fax: [phone]

## 2021-01-01 NOTE — Progress Notes (Addendum)
Cardiology Office Note:    Date:  01/02/2021   ID:  Brandon [name], DOB 01-Jun-1956, MRN [id number]  PCP:  Luetta Nutting, DO   [facility] Providers Cardiologist:  Freada Bergeron, MD     Referring MD: Luetta Nutting, DO   Chief Complaint:  Follow-up (CAD with angina)    Patient Profile:    Brandon [name] is a 65 y.o. male with:   Coronary artery disease   S/p DES to [facility] in 12/21  Residual small vessel dz in RPDA, OM1, OM2, D1 and apical LAD; pLCx 2 (target for PCI if +Symptoms)  Chest pain post PCI >> tx briefly with [place] but later [facility] due to no symptoms   Hypertension   Hyperlipidemia  Diabetes mellitus   OSA  Obesity   Prior CV studies: LEFT HEART CATH 08/15/2020 Narrative  Mid RCA lesion is 90% stenosed.  Prox Cx lesion is 75% stenosed.  2nd Mrg lesion is 75% stenosed.  1st Mrg lesion is 100% stenosed. Faint right to left and left to left collaterals.  RPDA lesion is 70% stenosed.  2nd Diag-1 lesion is 75% stenosed.  2nd Diag-2 lesion is 75% stenosed.  Dist LAD lesion is 50% stenosed.  A drug-eluting stent was successfully placed using a STENT RESOLUTE ONYX 4.0X38, optimized with IVUS.  Post intervention, there is a 0% residual stenosis.  LV end diastolic pressure is normal.  There is no aortic valve stenosis.  75 cm long sheath needed for support due to tortuosity in the right subclavian to engage the RCA. Left coronary could be engaged easily with short sheath. Diffuse multivessel disease, but no LAD involvement. RCA successfully treated.  Small vessel disease noted in the RPDA, OM1 and OM2, second diagonal and apical LAD.  Proximal circumflex would be a potential target for PCI if he had more symptoms.   Echocardiogram 08/13/20 EF 60-65, no RWMA, mild LVH, Gr 1 DD   History of Present Illness: Brandon [name] was last seen by Dr. Johney Frame 12/04/20.  He was having symptoms of and shortness of breath.  He was started on  Isosorbide and his beta-blocker was changed to Metoprolol succinate.  He returns for f/u with an eye towards cardiac catheterization if he has persistent angina.    He is here alone today.  He continues to have chest pain and shortness of breath with exertion.  His symptoms have not improved. He has not had any rest symptoms.  He has not had to take any NTG.  He has substernal heaviness/burning.  He has not had any radiation or associated diaphoresis.  He has not had orthopnea, syncope.  He does have some mild LE edema.  His endocrinologist stopped Actos but it has not improved.          Past Medical History:  Diagnosis Date  . Anxiety   . Basal cell carcinoma (BCC) in situ of skin   . Colon polyps   . Coronary artery disease   . Depression   . Diabetes mellitus without complication ([place])   . Hypertension   . SBO (small bowel obstruction) (HCC)     Current Medications: Current Meds  Medication Sig  . amLODipine (NORVASC) 10 MG tablet Take 10 mg by mouth every morning.  . ARIPiprazole (ABILIFY) 10 MG tablet Take 10 mg by mouth every morning.  Marland Kitchen aspirin 81 MG EC tablet Take 81 mg by mouth every morning.  Marland Kitchen atorvastatin (LIPITOR) 80 MG tablet TAKE 1 TABLET (80 MG TOTAL)  BY MOUTH DAILY.  Marland Kitchen Blood Glucose Monitoring Suppl (FREESTYLE LITE) w/Device KIT USE AS DIRECTED.  Marland Kitchen Blood Pressure Monitoring (OMRON 3 SERIES BP MONITOR) DEVI USE AS DIRECTED.  Marland Kitchen buPROPion (WELLBUTRIN XL) 150 MG 24 hr tablet TAKE 1 TABLET BY MOUTH ONCE DAILY  . buPROPion (WELLBUTRIN XL) 300 MG 24 hr tablet TAKE 1 TABLET BY MOUTH ONCE DAILY  . clopidogrel (PLAVIX) 75 MG tablet TAKE 1 TABLET (75 MG TOTAL) BY MOUTH DAILY WITH BREAKFAST.  Marland Kitchen Continuous Blood Gluc Sensor (DEXCOM G6 SENSOR) MISC Use as directed. Change sensor every 10 days  . empagliflozin (JARDIANCE) 25 MG TABS tablet TAKE 1 TABLET BY MOUTH ONCE DAILY  . glucose blood test strip USE 1 TO CHECK BLOOD GLUCOSE UP TO 5 TIMES DAILY.  Marland Kitchen insulin degludec (TRESIBA  FLEXTOUCH) 100 UNIT/ML FlexTouch Pen Inject 50 Units into the skin daily.  . insulin lispro (HUMALOG) 100 UNIT/ML KwikPen Inject 10-24 Units into the skin 3 (three) times daily before meals.  . isosorbide mononitrate (IMDUR) 30 MG 24 hr tablet Take 1 tablet (30 mg total) by mouth daily.  . Lancets (FREESTYLE) lancets USE 1 TO CHECK BLOOD GLUCOSE UP TO 5 TIMES DAILY.  Marland Kitchen losartan (COZAAR) 25 MG tablet Take 1 tablet (25 mg total) by mouth daily. Take only when systolic BP greater than 237.  . metoCLOPramide (REGLAN) 10 MG tablet Take 10 mg by mouth daily as needed for nausea or vomiting (upset stomach). PRN  . metoprolol succinate (TOPROL-XL) 50 MG 24 hr tablet Take 1 tablet (50 mg total) by mouth daily. Take with or immediately following a meal.  . nitroGLYCERIN (NITROSTAT) 0.4 MG SL tablet Place 1 tablet (0.4 mg total) under the tongue every 5 (five) minutes as needed for chest pain.  Marland Kitchen ondansetron (ZOFRAN ODT) 4 MG disintegrating tablet Take 1 tablet (4 mg total) by mouth every 8 (eight) hours as needed for nausea or vomiting.  . pantoprazole (PROTONIX) 40 MG tablet TAKE 1 TABLET BY MOUTH ONCE DAILY  . tamsulosin (FLOMAX) 0.4 MG CAPS capsule TAKE 1 CAPSULE BY MOUTH ONCE DAILY.  Marland Kitchen testosterone cypionate (DEPOTESTOSTERONE CYPIONATE) 200 MG/ML injection Inject 1 mL (200 mg total) into the muscle every Sunday.  Marland Kitchen tiZANidine (ZANAFLEX) 4 MG tablet Take 2 tablets (8 mg total) by mouth 3 (three) times daily.  . traMADol (ULTRAM) 50 MG tablet TAKE 2 TABLETS (100 MG TOTAL) BY MOUTH 3 (THREE) TIMES DAILY AS NEEDED.  Marland Kitchen triazolam (HALCION) 0.25 MG tablet TAKE 1-2 TABS BY MOUTH 2 HOURS BEFORE PROCEDURE OR IMAGING.DO NOT DRIVE WITH THIS MEDICATION.     Allergies:   Kiwi extract, Other, Penicillins, Ancef [cefazolin], Latex, and Levaquin [levofloxacin]   Social History   Tobacco Use  . Smoking status: Former Smoker    Quit date: 2007    Years since quitting: 15.3  . Smokeless tobacco: Never Used   Substance Use Topics  . Alcohol use: Not Currently  . Drug use: Not Currently     Family Hx: The patient's family history includes Anxiety disorder in his father and mother; COPD in his mother; Cancer in his father; Depression in his father and mother; Other in his father. There is no history of Diabetes.  Review of Systems  Gastrointestinal: Negative for hematochezia and melena.  Genitourinary: Negative for hematuria.     EKGs/Labs/Other Test Reviewed:    EKG:  EKG is  Not  ordered today.  The ekg ordered today demonstrates n/a  Recent Labs: 04/16/2020: Magnesium 1.9 08/13/2020:  TSH 2.345 10/23/2020: ALT 12 01/02/2021: BUN 12; Creatinine, Ser 0.86; Hemoglobin WILL FOLLOW; Platelets WILL FOLLOW; Potassium 4.0; Sodium 140   Recent Lipid Panel Lab Results  Component Value Date/Time   CHOL 113 10/23/2020 08:24 AM   TRIG 74 10/23/2020 08:24 AM   HDL 45 10/23/2020 08:24 AM   CHOLHDL 2.5 10/23/2020 08:24 AM   CHOLHDL 5.0 08/13/2020 01:19 AM   LDLCALC 53 10/23/2020 08:24 AM     Risk Assessment/Calculations:      Physical Exam:    VS:  BP (!) 162/60   Pulse 94   Ht 5' 9" (1.753 m)   Wt 243 lb 9.6 oz (110.5 kg)   SpO2 96%   BMI 35.97 kg/m     Wt Readings from Last 3 Encounters:  01/02/21 243 lb 9.6 oz (110.5 kg)  12/05/20 242 lb 3.2 oz (109.9 kg)  12/04/20 240 lb 3.2 oz (109 kg)     Constitutional:      Appearance: Healthy appearance. Not in distress.  Neck:     Vascular: JVD normal.  Pulmonary:     Effort: Pulmonary effort is normal.     Breath sounds: No wheezing. No rales.  Cardiovascular:     Normal rate. Regular rhythm. Normal S1. Normal S2.     Murmurs: There is no murmur.  Edema:    Pretibial: bilateral trace edema of the pretibial area. Abdominal:     Palpations: Abdomen is soft. There is no hepatomegaly.  Skin:    General: Skin is warm and dry.  Neurological:     General: No focal deficit present.     Mental Status: Alert and oriented to person,  place and time.     Cranial Nerves: Cranial nerves are intact.          ASSESSMENT & PLAN:    1. Coronary artery disease of native artery of native heart with stable angina pectoris [facility]) S/p DES to [facility] in 12/21.  He has residual mod disease the LCx that can be approached with PCI if he has refractory symptoms.  He also has small vessel disease in the RPDA, OM1, OM2, D2 and apical LAD.  He has symptoms of CCS Class II-III angina that is refractory to 3 antianginal drugs.  He has not had any improvement with recent med changes.  I have recommended proceeding with cardiac catheterization with an eye towards PCI of the LCx.  I have reviewed this with Dr. Johney Frame who agrees.  Continue current dose of Amlodipine, Isosorbide, Metoprolol succinate, ASA, Clopidogrel, Atorvastatin.  F/u after cardiac catheterization.   2. Essential hypertension BP elevated today.  He has not taken any meds yet.  When he takes all his BP meds, his BP drops to 536-144 systolic.  He feels weak with this.  I have asked him to break up his meds and take 2 in the AM and 2 in the PM.  I will not adjust his meds further at this time.  Of note, he does have leg edema and we may need to reduce his Amlodipine at some point in the future.    3. Mixed hyperlipidemia Continue high intensity statin Rx.      Shared Decision Making/Informed Consent The risks [stroke (1 in 1000), death (1 in 1000), kidney failure [usually temporary] (1 in 500), bleeding (1 in 200), allergic reaction [possibly serious] (1 in 200)], benefits (diagnostic support and management of coronary artery disease) and alternatives of a cardiac catheterization were discussed in detail with Mr.  Musa and he is willing to proceed.   Dispo:  Return in about 4 weeks (around 01/30/2021) for Post Procedure Follow Up.   Medication Adjustments/Labs and Tests Ordered: Current medicines are reviewed at length with the patient today.  Concerns regarding medicines are  outlined above.  Tests Ordered: Orders Placed This Encounter  Procedures  . Basic metabolic panel  . CBC   Medication Changes: No orders of the defined types were placed in this encounter.   Signed, Richardson Dopp, PA-C  01/02/2021 5:47 PM    [facility] [place], [place], [place]  [address] Phone: [phone]; Fax: [phone]

## 2021-01-02 ENCOUNTER — Telehealth: Payer: Self-pay | Admitting: *Deleted

## 2021-01-02 ENCOUNTER — Encounter: Payer: Self-pay | Admitting: Physician Assistant

## 2021-01-02 ENCOUNTER — Ambulatory Visit (INDEPENDENT_AMBULATORY_CARE_PROVIDER_SITE_OTHER): Payer: No Typology Code available for payment source | Admitting: Physician Assistant

## 2021-01-02 ENCOUNTER — Encounter: Payer: Self-pay | Admitting: *Deleted

## 2021-01-02 ENCOUNTER — Other Ambulatory Visit (HOSPITAL_COMMUNITY): Payer: Self-pay

## 2021-01-02 ENCOUNTER — Ambulatory Visit (INDEPENDENT_AMBULATORY_CARE_PROVIDER_SITE_OTHER): Payer: No Typology Code available for payment source | Admitting: Rehabilitative and Restorative Service Providers"

## 2021-01-02 VITALS — BP 162/60 | HR 94 | Ht 69.0 in | Wt 243.6 lb

## 2021-01-02 DIAGNOSIS — M545 Low back pain, unspecified: Secondary | ICD-10-CM | POA: Diagnosis not present

## 2021-01-02 DIAGNOSIS — I10 Essential (primary) hypertension: Secondary | ICD-10-CM

## 2021-01-02 DIAGNOSIS — E782 Mixed hyperlipidemia: Secondary | ICD-10-CM | POA: Diagnosis not present

## 2021-01-02 DIAGNOSIS — Z01812 Encounter for preprocedural laboratory examination: Secondary | ICD-10-CM

## 2021-01-02 DIAGNOSIS — Z01818 Encounter for other preprocedural examination: Secondary | ICD-10-CM | POA: Diagnosis not present

## 2021-01-02 DIAGNOSIS — R2689 Other abnormalities of gait and mobility: Secondary | ICD-10-CM | POA: Diagnosis not present

## 2021-01-02 DIAGNOSIS — I25118 Atherosclerotic heart disease of native coronary artery with other forms of angina pectoris: Secondary | ICD-10-CM

## 2021-01-02 DIAGNOSIS — I25110 Atherosclerotic heart disease of native coronary artery with unstable angina pectoris: Secondary | ICD-10-CM

## 2021-01-02 DIAGNOSIS — M6281 Muscle weakness (generalized): Secondary | ICD-10-CM

## 2021-01-02 NOTE — Patient Instructions (Signed)
Medication Instructions:  TAKE LOSARTAN AND ISOSORBIDE IN THE AM  TAKE AMLODIPINE AND METOPROLOL IN THE PM *If you need a refill on your cardiac medications before your next appointment, please call your pharmacy*   Lab Work: TODAY BMET AND CBC  If you have labs (blood work) drawn today and your tests are completely normal, you will receive your results only by: Marland Kitchen MyChart Message (if you have MyChart) OR . A paper copy in the mail If you have any lab test that is abnormal or we need to change your treatment, we will call you to review the results.   Testing/Procedures: NONE   Follow-Up: At [facility], you and your health needs are our priority.  As part of our continuing mission to provide you with exceptional heart care, we have created designated Provider Care Teams.  These Care Teams include your primary Cardiologist (physician) and Advanced Practice Providers (APPs -  Physician Assistants and Nurse Practitioners) who all work together to provide you with the care you need, when you need it.  We recommend signing up for the patient portal called "MyChart".  Sign up information is provided on this After Visit Summary.  MyChart is used to connect with patients for Virtual Visits (Telemedicine).  Patients are able to view lab/test results, encounter notes, upcoming appointments, etc.  Non-urgent messages can be sent to your provider as well.   To learn more about what you can do with MyChart, go to [email or web address].    Your next appointment:   1 month(s)  The format for your next appointment:   In Person  Provider:   You may see Meriam Sprague, MD or one of the following Advanced Practice Providers on your designated Care Team:    Tereso Newcomer, PA-C  Chelsea Aus, [place]    Other Instructions Your physician has requested that you have a cardiac catheterization. Cardiac catheterization is used to diagnose and/or treat various heart conditions. Doctors may recommend  this procedure for a number of different reasons. The most common reason is to evaluate chest pain. Chest pain can be a symptom of coronary artery disease (CAD), and cardiac catheterization can show whether plaque is narrowing or blocking your heart's arteries. This procedure is also used to evaluate the valves, as well as measure the blood flow and oxygen levels in different parts of your heart. For further information please visit [email or web address] Please follow instruction sheet, as given.     [facility] CARDIOVASCULAR DIVISION [facility] ST OFFICE [address] [place] [address] Dept: [phone] Loc: [phone]  ZION TA  01/02/2021  You are scheduled for a Cardiac Catheterization on Wednesday, May 18 with Dr. Bryan Lemma.  1. Please arrive at the [facility] (Main Entrance A) at [facility]: [address] [place], [place] [address] at 6:30 AM (This time is two hours before your procedure to ensure your preparation). Free valet parking service is available.   Special note: Every effort is made to have your procedure done on time. Please understand that emergencies sometimes delay scheduled procedures.  2. Diet: Do not eat solid foods after midnight.  The patient may have clear liquids until 5am upon the day of the procedure.  3. Labs: You will need to have blood drawn on Wednesday, May 10 at [facility] at [facility]. [address], [place]  Open: 7:30am - 5pm    Phone: [phone]. You do not need to  be fasting.  4. Medication instructions in preparation for your procedure:   Contrast Allergy: No      TAKE ONLY 1/2 DOSE NIGHT BEFORE CATH OF HUMALOG AND MORNING OF CATH HOLD TESIBA    HOLD EMPAGLIFLOZIN 25 MG AM OF CATH   On the morning of your procedure, take your Aspirin and any morning medicines NOT listed above.  You may use sips of water.  5. Plan for one night  stay--bring personal belongings. 6. Bring a current list of your medications and current insurance cards. 7. You MUST have a responsible person to drive you home. 8. Someone MUST be with you the first 24 hours after you arrive home or your discharge will be delayed. 9. Please wear clothes that are easy to get on and off and wear slip-on shoes.  Thank you for allowing [place] to care for you!   -- [facility]

## 2021-01-02 NOTE — Patient Instructions (Signed)
Access Code: [email or web address] URL: [email or web address] Date: 01/02/2021 Prepared by: Margretta Ditty  Exercises Gastroc Stretch on Wall - 2 x daily - 7 x weekly - 1 sets - 2 reps - 30 seconds hold Wall Quarter Squat - 2 x daily - 7 x weekly - 1 sets - 10 reps Standing with Forearms Thoracic Rotation - 2 x daily - 7 x weekly - 1 sets - 10 reps Romberg Stance with Eyes Closed - 2 x daily - 7 x weekly - 1 sets - 3 reps - 20-30 seconds hold Heel Walking - 2 x daily - 7 x weekly - 1 sets - 10 reps Toe Walking - 2 x daily - 7 x weekly - 1 sets - 10 reps Single Leg Stance with Support - 2 x daily - 7 x weekly - 1 sets - 3 reps - 10-15 seconds hold Sit to Stand with Armchair - 2 x daily - 7 x weekly - 1 sets - 10 reps Seated Hamstring Stretch - 1 x daily - 7 x weekly - 3 sets - 2-3 reps - 20 seconds hold

## 2021-01-02 NOTE — Telephone Encounter (Signed)
Lm for pt to call back . Need to schedule pt's covid test prior to cath on 01/10/21 ./cy

## 2021-01-02 NOTE — Therapy (Signed)
[facility] [address] [place], [place], [address] Phone: [phone]   Fax:  [phone]  Physical Therapy Treatment  Patient Details  Name: Brandon [name] MRN: [id number] Date of Birth: 1955/11/17 Referring Provider (PT): Rodney Langton, MD   Encounter Date: 01/02/2021   PT End of Session - 01/02/21 1531    Visit Number 6    Number of Visits 12    Date for PT Re-Evaluation 01/23/21    Authorization Type UMR    PT Start Time 1148    PT Stop Time 1231    PT Time Calculation (min) 43 min    Activity Tolerance Patient tolerated treatment well    Behavior During Therapy [facility] for tasks assessed/performed           Past Medical History:  Diagnosis Date  . Anxiety   . Basal cell carcinoma (BCC) in situ of skin   . Colon polyps   . Coronary artery disease   . Depression   . Diabetes mellitus without complication (HCC)   . Hypertension   . SBO (small bowel obstruction) (HCC)     Past Surgical History:  Procedure Laterality Date  . BACK SURGERY    . CARDIAC CATHETERIZATION    . CATARACT EXTRACTION W/ INTRAOCULAR LENS  IMPLANT, BILATERAL    . CHOLECYSTECTOMY    . CHOLECYSTECTOMY, LAPAROSCOPIC    . CORONARY STENT INTERVENTION N/A 08/15/2020   Procedure: CORONARY STENT INTERVENTION;  Surgeon: Corky Crafts, MD;  Location: [facility] INVASIVE CV LAB;  Service: Cardiovascular;  Laterality: N/A;  . EYE SURGERY    . INTRAVASCULAR ULTRASOUND/IVUS N/A 08/15/2020   Procedure: Intravascular Ultrasound/IVUS;  Surgeon: Corky Crafts, MD;  Location: [facility] INVASIVE CV LAB;  Service: Cardiovascular;  Laterality: N/A;  . LEFT HEART CATH AND CORONARY ANGIOGRAPHY N/A 08/15/2020   Procedure: LEFT HEART CATH AND CORONARY ANGIOGRAPHY;  Surgeon: Corky Crafts, MD;  Location: [facility] INVASIVE CV LAB;  Service: Cardiovascular;  Laterality: N/A;  . LUMBAR FUSION     L3-5 with rods  . XI ROBOTIC ASSISTED INGUINAL HERNIA REPAIR  WITH MESH     x 3 surgeries    There were no vitals filed for this visit.   Subjective Assessment - 01/02/21 1150    Subjective The patient saw cardiology office this morning and is going to modify medication schedule.  He is doing exercises regularly at home.  The pool is going well-- he gets less back pressure in the water.  He walked 800 yards x 2 yesterday with cane and his legs did not hurt.    Pertinent History diabetes, HTN, h/o low back pain since 2006 (fell in the shower)-- has spinal cord stimulator;  coronary artery disease    Patient Stated Goals to be able to kneel and rise.    Currently in Pain? No/denies              [facility] PT Assessment - 01/02/21 1152      Assessment   Medical Diagnosis vertigo  "Vestibular rehabilitation for vertigo as well as gait training and low back strengthening for lumbar spondylosis."    Referring Provider (PT) Rodney Langton, MD    Onset Date/Surgical Date 11/28/20                         [facility] Adult PT Treatment/Exercise - 01/02/21 1208      Ambulation/Gait   Ambulation/Gait Yes    Ambulation/Gait Assistance 6:  [facility] [address] [place], [place], [address] Phone: [phone]   Fax:  [phone]  Physical Therapy Treatment  Patient Details  Name: Brandon [name] MRN: [id number] Date of Birth: 1955/11/17 Referring Provider (PT): Rodney Langton, MD   Encounter Date: 01/02/2021   PT End of Session - 01/02/21 1531    Visit Number 6    Number of Visits 12    Date for PT Re-Evaluation 01/23/21    Authorization Type UMR    PT Start Time 1148    PT Stop Time 1231    PT Time Calculation (min) 43 min    Activity Tolerance Patient tolerated treatment well    Behavior During Therapy [facility] for tasks assessed/performed           Past Medical History:  Diagnosis Date  . Anxiety   . Basal cell carcinoma (BCC) in situ of skin   . Colon polyps   . Coronary artery disease   . Depression   . Diabetes mellitus without complication (HCC)   . Hypertension   . SBO (small bowel obstruction) (HCC)     Past Surgical History:  Procedure Laterality Date  . BACK SURGERY    . CARDIAC CATHETERIZATION    . CATARACT EXTRACTION W/ INTRAOCULAR LENS  IMPLANT, BILATERAL    . CHOLECYSTECTOMY    . CHOLECYSTECTOMY, LAPAROSCOPIC    . CORONARY STENT INTERVENTION N/A 08/15/2020   Procedure: CORONARY STENT INTERVENTION;  Surgeon: Corky Crafts, MD;  Location: [facility] INVASIVE CV LAB;  Service: Cardiovascular;  Laterality: N/A;  . EYE SURGERY    . INTRAVASCULAR ULTRASOUND/IVUS N/A 08/15/2020   Procedure: Intravascular Ultrasound/IVUS;  Surgeon: Corky Crafts, MD;  Location: [facility] INVASIVE CV LAB;  Service: Cardiovascular;  Laterality: N/A;  . LEFT HEART CATH AND CORONARY ANGIOGRAPHY N/A 08/15/2020   Procedure: LEFT HEART CATH AND CORONARY ANGIOGRAPHY;  Surgeon: Corky Crafts, MD;  Location: [facility] INVASIVE CV LAB;  Service: Cardiovascular;  Laterality: N/A;  . LUMBAR FUSION     L3-5 with rods  . XI ROBOTIC ASSISTED INGUINAL HERNIA REPAIR  WITH MESH     x 3 surgeries    There were no vitals filed for this visit.   Subjective Assessment - 01/02/21 1150    Subjective The patient saw cardiology office this morning and is going to modify medication schedule.  He is doing exercises regularly at home.  The pool is going well-- he gets less back pressure in the water.  He walked 800 yards x 2 yesterday with cane and his legs did not hurt.    Pertinent History diabetes, HTN, h/o low back pain since 2006 (fell in the shower)-- has spinal cord stimulator;  coronary artery disease    Patient Stated Goals to be able to kneel and rise.    Currently in Pain? No/denies              [facility] PT Assessment - 01/02/21 1152      Assessment   Medical Diagnosis vertigo  "Vestibular rehabilitation for vertigo as well as gait training and low back strengthening for lumbar spondylosis."    Referring Provider (PT) Rodney Langton, MD    Onset Date/Surgical Date 11/28/20                         [facility] Adult PT Treatment/Exercise - 01/02/21 1208      Ambulation/Gait   Ambulation/Gait Yes    Ambulation/Gait Assistance 6:  skilled therapeutic intervention in order to improve the following deficits and impairments:     Visit Diagnosis: Other abnormalities of gait and mobility  Muscle weakness (generalized)  Bilateral low back pain without sciatica, unspecified chronicity     Problem List Patient Active Problem List   Diagnosis Date Noted  . Vertigo 11/28/2020  . Myelopathy (HCC) 11/28/2020  . Insomnia 09/03/2020  . Status post coronary artery stent placement   . Mixed hyperlipidemia   . Coronary artery disease involving native coronary artery of native heart with unstable angina pectoris (HCC)   . Chest pain 08/11/2020  . Facet arthropathy, lumbar 06/22/2020  . Major depression in partial remission (HCC) 06/04/2020  . Nausea and vomiting 04/14/2020  . Hyperlipidemia associated with type 2 diabetes mellitus (HCC) 04/14/2020  . Primary hypertension 04/07/2020  . Type 2 diabetes mellitus with other specified complication (HCC) 04/07/2020  . SBO (small bowel obstruction) (HCC) 04/06/2020  . Sepsis (HCC) 10/06/2019  . Status post insertion of spinal cord stimulator 05/19/2017  . 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 Erectile dysfunction 10/09/2015    Martasia Talamante, PT 01/02/2021, 3:33 PM  [facility] [address] [place], [place], [address] Phone: [phone]   Fax:  [phone]  Name: Brandon [name] MRN: [id number] Date of Birth: [birth year]

## 2021-01-02 NOTE — Telephone Encounter (Signed)
Pt returned call and will have covid testing done on 01/08/21 at 2:40 pm  ./cy

## 2021-01-03 ENCOUNTER — Encounter (HOSPITAL_COMMUNITY): Payer: No Typology Code available for payment source

## 2021-01-03 LAB — BASIC METABOLIC PANEL
BUN/Creatinine Ratio: 14 (ref 10–24)
BUN: 12 mg/dL (ref 8–27)
CO2: 23 mmol/L (ref 20–29)
Calcium: 9.2 mg/dL (ref 8.6–10.2)
Chloride: 102 mmol/L (ref 96–106)
Creatinine, Ser: 0.86 mg/dL (ref 0.76–1.27)
Glucose: 174 mg/dL — ABNORMAL HIGH (ref 65–99)
Potassium: 4 mmol/L (ref 3.5–5.2)
Sodium: 140 mmol/L (ref 134–144)
eGFR: 96 mL/min/{1.73_m2} (ref >59)

## 2021-01-03 LAB — CBC
Hematocrit: 40.4 % (ref 37.5–51.0)
Hemoglobin: 12.3 g/dL — ABNORMAL LOW (ref 13.0–17.7)
MCH: 24.4 pg — ABNORMAL LOW (ref 26.6–33.0)
MCHC: 30.4 g/dL — ABNORMAL LOW (ref 31.5–35.7)
MCV: 80 fL (ref 79–97)
Platelets: 357 10*3/uL (ref 150–450)
RBC: 5.05 x10E6/uL (ref 4.14–5.80)
RDW: 15.5 % — ABNORMAL HIGH (ref 11.6–15.4)
WBC: 6.7 10*3/uL (ref 3.4–10.8)

## 2021-01-04 ENCOUNTER — Encounter: Payer: Self-pay | Admitting: Rehabilitative and Restorative Service Providers"

## 2021-01-05 ENCOUNTER — Encounter (HOSPITAL_COMMUNITY): Payer: No Typology Code available for payment source

## 2021-01-05 ENCOUNTER — Ambulatory Visit (INDEPENDENT_AMBULATORY_CARE_PROVIDER_SITE_OTHER): Payer: No Typology Code available for payment source | Admitting: Physical Therapy

## 2021-01-05 ENCOUNTER — Other Ambulatory Visit: Payer: Self-pay

## 2021-01-05 DIAGNOSIS — M545 Low back pain, unspecified: Secondary | ICD-10-CM | POA: Diagnosis not present

## 2021-01-05 DIAGNOSIS — M6281 Muscle weakness (generalized): Secondary | ICD-10-CM | POA: Diagnosis not present

## 2021-01-05 DIAGNOSIS — R2689 Other abnormalities of gait and mobility: Secondary | ICD-10-CM | POA: Diagnosis present

## 2021-01-05 NOTE — Therapy (Signed)
[facility] [place] [address] [place], [place], [address] Phone: [phone]   Fax:  [phone]  Physical Therapy Treatment  Patient Details  Name: Brandon [name] MRN: [id number] Date of Birth: 1955/12/26 Referring Provider (PT): Rodney Langton, MD   Encounter Date: 01/05/2021   PT End of Session - 01/05/21 1346    Visit Number 7    Number of Visits 12    Date for PT Re-Evaluation 01/23/21    Authorization Type UMR    PT Start Time 1315    PT Stop Time 1400    PT Time Calculation (min) 45 min    Activity Tolerance Patient tolerated treatment well    Behavior During Therapy [facility] for tasks assessed/performed           Past Medical History:  Diagnosis Date  . Anxiety   . Basal cell carcinoma (BCC) in situ of skin   . Colon polyps   . Coronary artery disease   . Depression   . Diabetes mellitus without complication (HCC)   . Hypertension   . SBO (small bowel obstruction) (HCC)     Past Surgical History:  Procedure Laterality Date  . BACK SURGERY    . CARDIAC CATHETERIZATION    . CATARACT EXTRACTION W/ INTRAOCULAR LENS  IMPLANT, BILATERAL    . CHOLECYSTECTOMY    . CHOLECYSTECTOMY, LAPAROSCOPIC    . CORONARY STENT INTERVENTION N/A 08/15/2020   Procedure: CORONARY STENT INTERVENTION;  Surgeon: Corky Crafts, MD;  Location: [facility] INVASIVE CV LAB;  Service: Cardiovascular;  Laterality: N/A;  . EYE SURGERY    . INTRAVASCULAR ULTRASOUND/IVUS N/A 08/15/2020   Procedure: Intravascular Ultrasound/IVUS;  Surgeon: Corky Crafts, MD;  Location: [facility] INVASIVE CV LAB;  Service: Cardiovascular;  Laterality: N/A;  . LEFT HEART CATH AND CORONARY ANGIOGRAPHY N/A 08/15/2020   Procedure: LEFT HEART CATH AND CORONARY ANGIOGRAPHY;  Surgeon: Corky Crafts, MD;  Location: [facility] INVASIVE CV LAB;  Service: Cardiovascular;  Laterality: N/A;  . LUMBAR FUSION     L3-5 with rods  . XI ROBOTIC ASSISTED INGUINAL HERNIA REPAIR  WITH MESH     x 3 surgeries    There were no vitals filed for this visit.   Subjective Assessment - 01/05/21 1323    Subjective Pt reports he is doing much better than last visit. He'd like to work on leg stretches today. He states that he is able to get up from kneeling position without the dizziness for the last couple days. Thinks it may be medication related.    Pertinent History diabetes, HTN, h/o low back pain since 2006 (fell in the shower)-- has spinal cord stimulator;  coronary artery disease    Patient Stated Goals to be able to kneel and rise.    Currently in Pain? Yes    Pain Score 2     Pain Location Back    Pain Orientation Lower    Pain Descriptors / Indicators Aching    Aggravating Factors  walking >800 yds, going over bumps in car    Pain Relieving Factors sitting.           Pt seen for aquatic therapy today.  Treatment took place in water 3.25-4 ft in depth at the [facility] pool. Temp of water was 91.  Pt entered/exited the pool via stairs independently with bilat rail.  Treatment:   Holding onto aqua jogger barbell: forward gait. Side stepping. Retro gait.  TA set with barbell press x 5  sec x 10 reps; repeated with forward row with barbell x 12   Holding onto pool edge:  Heel/ toe raises x 8, then heel raises x 10. Hip abdct x 12 each leg, Hip ext x 12 each leg.  High knee marching x 12.  Single leg on pool bench (in water) for hamstring stretch x 20 sec, then hip/knee flexion bends with foot on bench for dynamic stretch to standing hip flexor, and flexed glute, 2 reps per leg. Calf stretch x 15 sec, 2 reps each side.  Tandem gait forward/backward.     Sit to/from stand with side step L/R on pool bench - improved form.   SLS with intermittent UE support on pool edge x 15 sec x 2 reps each side. (Decreased balance for RLE).  Lumbar/thoracic rotation with hands in water like paddles x 5 reps each direction.    Pt requires buoyancy for support and to  offload joints with strengthening exercises. Viscosity of the water is needed for resistance of strengthening; water current perturbations provides challenge to standing balance unsupported, requiring increased core activation.    [facility] PT Assessment - 01/05/21 0001      Assessment   Medical Diagnosis vertigo  "Vestibular rehabilitation for vertigo as well as gait training and low back strengthening for lumbar spondylosis."    Referring Provider (PT) Rodney Langton, MD    Onset Date/Surgical Date 11/28/20    Next MD Visit after MRI.             PT Long Term Goals - 12/12/20 1344      PT LONG TERM GOAL #1   Title The patient will return demo HEP for flexibility, lumbar mobility, and core stability.    Time 6    Period Weeks    Target Date 01/23/21      PT LONG TERM GOAL #2   Title The patient will demonstrate ability to reach to the ground (to pick up after dogs) and return to standing without lightheadedness.    Time 6    Period Weeks    Target Date 01/23/21      PT LONG TERM GOAL #3   Title The patient will improve hamstring flexibility R side to -30 from full extension (90/90 supine starting position).    Time 6    Period Weeks    Target Date 01/23/21      PT LONG TERM GOAL #4   Title The patient will be further assessed on Berg balance, and gait speed and goals to follow as indicated.    Time 6    Period Weeks    Target Date 01/23/21      PT LONG TERM GOAL #5   Title The patient will be able to move floor<>stand with UE support due to h/o falls.    Time 6    Period Weeks    Target Date 01/23/21                 Plan - 01/05/21 1348    Clinical Impression Statement Positive response to exercises last visit, after pt recovered from soreness.  Pt reporting improved mobility and strength. He is now able to put single leg on bench in the water for hamstring stretch without the difficulty he had 2 sessions ago in the water.  Pt reported no increase in LBP  during session, just some stiffness after completing backward tandem gait.  Progressing well towards goals.    Comorbidities diabetes, HTN, h/o low back  pain since 2006 (fell in the shower)-- has spinal cord stimulator; coronary artery disease    PT Frequency 2x / week    PT Duration 6 weeks    PT Treatment/Interventions Patient/family education;ADLs/Self Care Home Management;Aquatic Therapy;Neuromuscular re-education;Therapeutic exercise;Therapeutic activities;Gait training;Taping;Dry needling;Manual techniques;Vestibular    PT Next Visit Plan progress gait and LE strengthening, lumbar mobility (h/o fusion) to tolerance, flexibility    PT Home Exercise Plan [email or web address]    Consulted and Agree with Plan of Care Patient           Patient will benefit from skilled therapeutic intervention in order to improve the following deficits and impairments:  Pain,Dizziness,Decreased range of motion,Decreased strength,Hypomobility,Impaired flexibility,Postural dysfunction,Decreased balance,Decreased activity tolerance  Visit Diagnosis: Other abnormalities of gait and mobility  Muscle weakness (generalized)  Bilateral low back pain without sciatica, unspecified chronicity     Problem List Patient Active Problem List   Diagnosis Date Noted  . Vertigo 11/28/2020  . Myelopathy (HCC) 11/28/2020  . Insomnia 09/03/2020  . Status post coronary artery stent placement   . Mixed hyperlipidemia   . Coronary artery disease involving native coronary artery of native heart with unstable angina pectoris (HCC)   . Chest pain 08/11/2020  . Facet arthropathy, lumbar 06/22/2020  . Major depression in partial remission (HCC) 06/04/2020  . Nausea and vomiting 04/14/2020  . Hyperlipidemia associated with type 2 diabetes mellitus (HCC) 04/14/2020  . Primary hypertension 04/07/2020  . Type 2 diabetes mellitus with other specified complication (HCC) 04/07/2020  . SBO (small bowel obstruction) (HCC) 04/06/2020  .  Sepsis (HCC) 10/06/2019  . Status post insertion of spinal cord stimulator 05/19/2017  . 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 Erectile dysfunction 10/09/2015    Mayer Camel, PTA 01/05/21 2:07 PM  [facility] [place] [address] [place], [place], [address] Phone: [phone]   Fax:  [phone]  Name: Brandon [name] MRN: [id number] Date of Birth: 1956-02-28

## 2021-01-08 ENCOUNTER — Encounter (HOSPITAL_COMMUNITY)
Admission: RE | Admit: 2021-01-08 | Discharge: 2021-01-08 | Disposition: A | Discharge status: Home / Self Care | Payer: No Typology Code available for payment source | Source: Ambulatory Visit | Attending: Cardiology | Admitting: Cardiology

## 2021-01-08 ENCOUNTER — Encounter: Payer: Self-pay | Admitting: Endocrinology

## 2021-01-08 ENCOUNTER — Other Ambulatory Visit: Payer: Self-pay

## 2021-01-08 ENCOUNTER — Telehealth: Payer: Self-pay | Admitting: Cardiology

## 2021-01-08 ENCOUNTER — Other Ambulatory Visit (HOSPITAL_COMMUNITY): Payer: Self-pay

## 2021-01-08 ENCOUNTER — Other Ambulatory Visit (HOSPITAL_COMMUNITY): Payer: No Typology Code available for payment source

## 2021-01-08 DIAGNOSIS — Z955 Presence of coronary angioplasty implant and graft: Secondary | ICD-10-CM

## 2021-01-08 NOTE — Progress Notes (Signed)
Incomplete Session Note  Patient Details  Name: Brandon [name] MRN: [id number] Date of Birth: 1956/03/02 Referring Provider:   Flowsheet Row CARDIAC REHAB PHASE II ORIENTATION from 11/07/2020 in [facility] CARDIAC [facility]  Referring Provider Meriam Sprague, MD      [name] did not complete his rehab session.  [name] that he is having a heart catheterization on Wednesday. [name] has not reported having any chest pain at cardiac rehab. Will hold exercise today pending heart cath results.[name], RN,BSN 01/08/2021 1:14 PM

## 2021-01-08 NOTE — Telephone Encounter (Signed)
    Pt said he have a heart cath on 01/10/21. He said his wife pto was not approved and his son can't stay with him if incase he needs to stay overnight after procedure. He said he doesn't know if he need to r/s his procedure. He would like to get a callback from the nurse

## 2021-01-08 NOTE — Telephone Encounter (Addendum)
Patient states, due to his wife's work schedule, he would not have transportation home and someone to be with him the first 24 hours if he is same day discharge on Wednesday Jan 10, 2021. Patient reports he would not have anyone else available May 18  for transportation and to stay with him.  Patient requests that I reschedule cath to Wednesday Jan 17, 2021 when he would have transportation and someone to be with him first 24 hours if same day discharge.  Patient states he does not have provider preference and knows cath will be scheduled with Dr Clifton James and not Dr Herbie Baltimore as originally scheduled. Patient aware [facility] has been rescheduled to Wednesday Jan 17, 2021 1 PM, arrive 25 AM.  I reviewed procedure instructions for Wednesday Jan 17, 2021 with patient.

## 2021-01-09 ENCOUNTER — Ambulatory Visit (HOSPITAL_COMMUNITY): Admission: RE | Admit: 2021-01-09 | Payer: No Typology Code available for payment source | Source: Ambulatory Visit

## 2021-01-09 ENCOUNTER — Ambulatory Visit (INDEPENDENT_AMBULATORY_CARE_PROVIDER_SITE_OTHER): Payer: No Typology Code available for payment source | Admitting: Rehabilitative and Restorative Service Providers"

## 2021-01-09 ENCOUNTER — Encounter (HOSPITAL_COMMUNITY): Payer: Self-pay

## 2021-01-09 ENCOUNTER — Encounter: Payer: Self-pay | Admitting: Rehabilitative and Restorative Service Providers"

## 2021-01-09 DIAGNOSIS — M545 Low back pain, unspecified: Secondary | ICD-10-CM

## 2021-01-09 DIAGNOSIS — M6281 Muscle weakness (generalized): Secondary | ICD-10-CM | POA: Diagnosis not present

## 2021-01-09 DIAGNOSIS — R2689 Other abnormalities of gait and mobility: Secondary | ICD-10-CM

## 2021-01-09 NOTE — Therapy (Signed)
[facility] [place] [address] [place], [place], [address] Phone: [phone]   Fax:  [phone]  Physical Therapy Treatment  Patient Details  Name: Brandon [name] MRN: [id number] Date of Birth: [birth year] Referring Provider (PT): Rodney Langton, MD   Encounter Date: 01/09/2021   PT End of Session - 01/09/21 1551    Visit Number 8    Number of Visits 12    Date for PT Re-Evaluation 01/23/21    Authorization Type UMR    PT Start Time 1148    PT Stop Time 1230    PT Time Calculation (min) 42 min    Activity Tolerance Patient tolerated treatment well    Behavior During Therapy [facility] for tasks assessed/performed           Past Medical History:  Diagnosis Date  . Anxiety   . Basal cell carcinoma (BCC) in situ of skin   . Colon polyps   . Coronary artery disease   . Depression   . Diabetes mellitus without complication (HCC)   . Hypertension   . SBO (small bowel obstruction) (HCC)     Past Surgical History:  Procedure Laterality Date  . BACK SURGERY    . CARDIAC CATHETERIZATION    . CATARACT EXTRACTION W/ INTRAOCULAR LENS  IMPLANT, BILATERAL    . CHOLECYSTECTOMY    . CHOLECYSTECTOMY, LAPAROSCOPIC    . CORONARY STENT INTERVENTION N/A 08/15/2020   Procedure: CORONARY STENT INTERVENTION;  Surgeon: Corky Crafts, MD;  Location: [facility] INVASIVE CV LAB;  Service: Cardiovascular;  Laterality: N/A;  . EYE SURGERY    . INTRAVASCULAR ULTRASOUND/IVUS N/A 08/15/2020   Procedure: Intravascular Ultrasound/IVUS;  Surgeon: Corky Crafts, MD;  Location: [facility] INVASIVE CV LAB;  Service: Cardiovascular;  Laterality: N/A;  . LEFT HEART CATH AND CORONARY ANGIOGRAPHY N/A 08/15/2020   Procedure: LEFT HEART CATH AND CORONARY ANGIOGRAPHY;  Surgeon: Corky Crafts, MD;  Location: [facility] INVASIVE CV LAB;  Service: Cardiovascular;  Laterality: N/A;  . LUMBAR FUSION     L3-5 with rods  . XI ROBOTIC ASSISTED INGUINAL HERNIA REPAIR  WITH MESH     x 3 surgeries    There were no vitals filed for this visit.   Subjective Assessment - 01/09/21 1151    Subjective The patient reports that cardiac rehab did not allow exercise yesterday since he has a scheduled cath next week.  "I'm doing better than last week."  Stairs are a problem-- he notes chest pain.    Pertinent History diabetes, HTN, h/o low back pain since 2006 (fell in the shower)-- has spinal cord stimulator;  coronary artery disease    Patient Stated Goals to be able to kneel and rise.    Currently in Pain? No/denies              [facility] PT Assessment - 01/09/21 1152      Assessment   Medical Diagnosis vertigo  "Vestibular rehabilitation for vertigo as well as gait training and low back strengthening for lumbar spondylosis."    Referring Provider (PT) Rodney Langton, MD    Onset Date/Surgical Date 11/28/20                         [facility] Adult PT Treatment/Exercise - 01/09/21 1152      Ambulation/Gait   Ambulation/Gait Yes    Ambulation/Gait Assistance 6: Modified independent (Device/Increase time)    Ambulation Distance (Feet) 120 Feet  Neuro Re-ed    Neuro Re-ed Details  Compliant surface standing with eyes open/eyes closed and head motion with eyes open.  Performed rocker board with lateral shifting and dec'ing UE support for postural challenge.  Single limb stance activities.      Exercises   Exercises Lumbar;Knee/Hip      Lumbar Exercises: Stretches   Active Hamstring Stretch Right;Left;30 seconds;2 reps    Other Lumbar Stretch Exercise Unable to do seated piriformis stretch    Other Lumbar Stretch Exercise Standing SI joint opening with leg on 6" surface and reaching towards contralateral LE to tolerance x 3 reps x 20 seconds      Lumbar Exercises: Aerobic   Nustep L 5 x 3 minutes UEs/LEs.      Lumbar Exercises: Standing   Heel Raises 10 reps    Other Standing Lumbar Exercises standing knee flexion x 10 reps R and L  sides      Lumbar Exercises: Supine   Bent Knee Raise 10 reps    Bridge 10 reps    Other Supine Lumbar Exercises bent knee fallouts R and L side x 10 reps R and L      Lumbar Exercises: Sidelying   Hip Abduction Right;Left;10 reps                  PT Education - 01/09/21 1226    Education Details reduced reps in LE strengthening activities    Person(s) Educated Patient    Methods Explanation;Demonstration;Handout    Comprehension Verbalized understanding;Returned demonstration               PT Long Term Goals - 12/12/20 1344      PT LONG TERM GOAL #1   Title The patient will return demo HEP for flexibility, lumbar mobility, and core stability.    Time 6    Period Weeks    Target Date 01/23/21      PT LONG TERM GOAL #2   Title The patient will demonstrate ability to reach to the ground (to pick up after dogs) and return to standing without lightheadedness.    Time 6    Period Weeks    Target Date 01/23/21      PT LONG TERM GOAL #3   Title The patient will improve hamstring flexibility R side to -30 from full extension (90/90 supine starting position).    Time 6    Period Weeks    Target Date 01/23/21      PT LONG TERM GOAL #4   Title The patient will be further assessed on Berg balance, and gait speed and goals to follow as indicated.    Time 6    Period Weeks    Target Date 01/23/21      PT LONG TERM GOAL #5   Title The patient will be able to move floor<>stand with UE support due to h/o falls.    Time 6    Period Weeks    Target Date 01/23/21                 Plan - 01/09/21 1552    Clinical Impression Statement PT modified land based activities today to have less intensity and reps in ther ex to avoid increased muscle soreness.  Patient is progressing well noting functional gains in daily tasks.  PT monitoring patinet t/o session due to some reports of exertional symtpoms- he has an upcoming cardiac cath scheduled. No symptoms during session  today.  PT Treatment/Interventions Patient/family education;ADLs/Self Care Home Management;Aquatic Therapy;Neuromuscular re-education;Therapeutic exercise;Therapeutic activities;Gait training;Taping;Dry needling;Manual techniques;Vestibular    PT Next Visit Plan progress gait and LE strengthening, lumbar mobility (h/o fusion) to tolerance, flexibility    PT Home Exercise Plan [email or web address]    Consulted and Agree with Plan of Care Patient           Patient will benefit from skilled therapeutic intervention in order to improve the following deficits and impairments:     Visit Diagnosis: Other abnormalities of gait and mobility  Muscle weakness (generalized)  Bilateral low back pain without sciatica, unspecified chronicity     Problem List Patient Active Problem List   Diagnosis Date Noted  . Vertigo 11/28/2020  . Myelopathy (HCC) 11/28/2020  . Insomnia 09/03/2020  . Status post coronary artery stent placement   . Mixed hyperlipidemia   . Coronary artery disease involving native coronary artery of native heart with unstable angina pectoris (HCC)   . Chest pain 08/11/2020  . Facet arthropathy, lumbar 06/22/2020  . Major depression in partial remission (HCC) 06/04/2020  . Nausea and vomiting 04/14/2020  . Hyperlipidemia associated with type 2 diabetes mellitus (HCC) 04/14/2020  . Primary hypertension 04/07/2020  . Type 2 diabetes mellitus with other specified complication (HCC) 04/07/2020  . SBO (small bowel obstruction) (HCC) 04/06/2020  . Sepsis (HCC) 10/06/2019  . Status post insertion of spinal cord stimulator 05/19/2017  . 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 Erectile dysfunction 10/09/2015    Casey Fye , PT 01/09/2021, 3:55 PM  [facility] [address] [place], [place], [address] Phone: [phone]   Fax:  [phone]  Name: Brandon [name] MRN: [id number] Date of Birth: 08/26/1956

## 2021-01-09 NOTE — Patient Instructions (Signed)
Access Code: [email or web address] URL: [email or web address] Date: 01/09/2021 Prepared by: Margretta Ditty  Exercises Gastroc Stretch on Wall - 2 x daily - 7 x weekly - 1 sets - 2 reps - 30 seconds hold Wall Quarter Squat - 2 x daily - 7 x weekly - 1 sets - 5 reps Standing with Forearms Thoracic Rotation - 2 x daily - 7 x weekly - 1 sets - 10 reps Romberg Stance with Eyes Closed - 2 x daily - 7 x weekly - 1 sets - 3 reps - 20-30 seconds hold Heel Walking - 2 x daily - 7 x weekly - 1 sets - 10 reps Toe Walking - 2 x daily - 7 x weekly - 1 sets - 10 reps Single Leg Stance with Support - 2 x daily - 7 x weekly - 1 sets - 3 reps - 10-15 seconds hold Sit to Stand with Armchair - 2 x daily - 7 x weekly - 1 sets - 5 reps Seated Hamstring Stretch - 1 x daily - 7 x weekly - 3 sets - 2-3 reps - 20 seconds hold

## 2021-01-10 ENCOUNTER — Encounter (HOSPITAL_COMMUNITY): Payer: No Typology Code available for payment source

## 2021-01-10 ENCOUNTER — Other Ambulatory Visit (HOSPITAL_COMMUNITY): Payer: Self-pay

## 2021-01-11 ENCOUNTER — Encounter (HOSPITAL_COMMUNITY): Payer: Self-pay

## 2021-01-11 ENCOUNTER — Ambulatory Visit (HOSPITAL_COMMUNITY): Admission: RE | Admit: 2021-01-11 | Payer: No Typology Code available for payment source | Source: Ambulatory Visit

## 2021-01-12 ENCOUNTER — Ambulatory Visit (INDEPENDENT_AMBULATORY_CARE_PROVIDER_SITE_OTHER): Payer: No Typology Code available for payment source | Admitting: Physical Therapy

## 2021-01-12 ENCOUNTER — Other Ambulatory Visit: Payer: Self-pay

## 2021-01-12 ENCOUNTER — Encounter (HOSPITAL_COMMUNITY): Payer: No Typology Code available for payment source

## 2021-01-12 ENCOUNTER — Encounter: Payer: Self-pay | Admitting: Physical Therapy

## 2021-01-12 DIAGNOSIS — R2689 Other abnormalities of gait and mobility: Secondary | ICD-10-CM | POA: Diagnosis not present

## 2021-01-12 DIAGNOSIS — M6281 Muscle weakness (generalized): Secondary | ICD-10-CM

## 2021-01-12 DIAGNOSIS — M545 Low back pain, unspecified: Secondary | ICD-10-CM | POA: Diagnosis not present

## 2021-01-12 NOTE — Therapy (Signed)
[place] [address], [place], [address] Phone: [phone]   Fax:  [phone]  Physical Therapy Treatment  Patient Details  Name: Brandon [name] MRN: [id number] Date of Birth: Apr 22, 1956 Referring Provider (PT): Aundria Mems, MD   Encounter Date: 01/12/2021   PT End of Session - 01/12/21 1320    Visit Number 9    Number of Visits 12    Date for PT Re-Evaluation 01/23/21    Authorization Type UMR    PT Start Time 1311    PT Stop Time 6948    PT Time Calculation (min) 44 min    Activity Tolerance Patient tolerated treatment well    Behavior During Therapy [facility] for tasks assessed/performed           Past Medical History:  Diagnosis Date  . Anxiety   . Basal cell carcinoma (BCC) in situ of skin   . Colon polyps   . Coronary artery disease   . Depression   . Diabetes mellitus without complication ([place])   . Hypertension   . SBO (small bowel obstruction) (HCC)     Past Surgical History:  Procedure Laterality Date  . BACK SURGERY    . CARDIAC CATHETERIZATION    . CATARACT EXTRACTION W/ INTRAOCULAR LENS  IMPLANT, BILATERAL    . CHOLECYSTECTOMY    . CHOLECYSTECTOMY, LAPAROSCOPIC    . CORONARY STENT INTERVENTION N/A 08/15/2020   Procedure: CORONARY STENT INTERVENTION;  Surgeon: Jettie Booze, MD;  Location: [facility];  Service: Cardiovascular;  Laterality: N/A;  . EYE SURGERY    . INTRAVASCULAR ULTRASOUND/IVUS N/A 08/15/2020   Procedure: Intravascular Ultrasound/IVUS;  Surgeon: Jettie Booze, MD;  Location: [place] [facility];  Service: Cardiovascular;  Laterality: N/A;  . LEFT HEART CATH AND CORONARY ANGIOGRAPHY N/A 08/15/2020   Procedure: LEFT HEART CATH AND CORONARY ANGIOGRAPHY;  Surgeon: Jettie Booze, MD;  Location: [facility] [place];  Service: Cardiovascular;  Laterality: N/A;  . LUMBAR FUSION     L3-5 with rods  . XI ROBOTIC ASSISTED INGUINAL HERNIA REPAIR  WITH MESH     x 3 surgeries    There were no vitals filed for this visit.   Subjective Assessment - 01/12/21 1317    Subjective Pt reports despite having increased pain in LB the day of MRI, his pain returned to baseline (3/10) the following day.  "3/10 for me is a 0"  Otherwise no changes since last day of rehab.    Pertinent History diabetes, HTN, h/o low back pain since 2006 (fell in the shower)-- has spinal cord stimulator;  coronary artery disease    Patient Stated Goals to be able to kneel and rise.    Currently in Pain? Yes    Pain Score 3     Pain Location Back    Pain Orientation Lower    Aggravating Factors  walking long distances, going over bumps in car    Pain Relieving Factors sitting             Pt seen for aquatic therapy today.  Treatment took place in water 3.25-4 ft in depth at the [employer] [facility] pool. Temp of water was 91.  Pt entered/exited the pool via stairs independently with bilat rail.  Treatment:   At beginning and end of treatment- LE float with two noodles under arms, for lower back decompression (legs dangling in water with knees slightly flexed).    Holding onto aqua jogger  barbell: forward gait. Side stepping. Retro gait.   Holding onto noodle:  TA press x 5 sec x 10 reps, thoracic rotation (to tolerance) x 5 reps each side  - increased tingle in low back; stopped.   Holding onto pool edge:   heel raises x 15. Hip ext x 12 each leg.  High knee marching x 12.  Clockwise hip circles x 10 each leg. Single leg on pool bench (in water) for hamstring stretch x 20 sec, then hip/knee flexion bends with foot on bench for dynamic stretch to standing hip flexor, and flexed glute, 2 reps per leg. Calf stretch x 15 sec, 2 reps each side.  Tandem gait forward/backward.   Single arm overhead stretch for QL x 5 reps each side (dynamic, no hold)   Sit to/from stand with side step L/R on pool bench - min cues for form.   SLS with intermittent UE support  on pool edge x 15 sec x 2 reps each side. (Improved balance for RLE).  Staggered stance with hands in water like paddles, pushing water forward/backward x 10 reps each leg forward.    Pt requires buoyancy for support and to offload joints with strengthening exercises. Viscosity of the water is needed for resistance of strengthening; water current perturbations provides challenge to standing balance unsupported, requiring increased core activation.      PT Long Term Goals - 01/12/21 1321      PT LONG TERM GOAL #1   Title The patient will return demo HEP for flexibility, lumbar mobility, and core stability.    Time 6    Period Weeks    Status On-going      PT LONG TERM GOAL #2   Title The patient will demonstrate ability to reach to the ground (to pick up after dogs) and return to standing without lightheadedness.    Baseline improved (since medication change), but still gets light headed if moving too quickly.    Time 6    Period Weeks    Status Partially Met      PT LONG TERM GOAL #3   Title The patient will improve hamstring flexibility R side to -30 from full extension (90/90 supine starting position).    Time 6    Period Weeks    Status On-going      PT LONG TERM GOAL #4   Title The patient will be further assessed on Berg balance, and gait speed and goals to follow as indicated.    Time 6    Period Weeks    Status On-going      PT LONG TERM GOAL #5   Title The patient will be able to move floor<>stand with UE support due to h/o falls.    Time 6    Period Weeks    Status On-going                 Plan - 01/12/21 1326    Clinical Impression Statement Pt reported increased pain in Lt low back to 5/10 with Lt side stepping; continued until he performed hamstring stretch. Rotation in water caused "tingling" in low back; resolved with rest.  Overall, exercises tolerated well. Pt progressing towards LTGs.    Comorbidities diabetes, HTN, h/o low back pain since 2006  (fell in the shower)-- has spinal cord stimulator; coronary artery disease    Rehab Potential Good    PT Frequency 2x / week    PT Duration 6 weeks    PT Treatment/Interventions  Patient/family education;ADLs/Self Care Home Management;Aquatic Therapy;Neuromuscular re-education;Therapeutic exercise;Therapeutic activities;Gait training;Taping;Dry needling;Manual techniques;Vestibular    PT Next Visit Plan progress gait and LE strengthening, lumbar mobility (h/o fusion) to tolerance, flexibility    PT Home Exercise Plan [id number]    Consulted and Agree with Plan of Care Patient           Patient will benefit from skilled therapeutic intervention in order to improve the following deficits and impairments:  Pain,Dizziness,Decreased range of motion,Decreased strength,Hypomobility,Impaired flexibility,Postural dysfunction,Decreased balance,Decreased activity tolerance  Visit Diagnosis: Bilateral low back pain without sciatica, unspecified chronicity  Muscle weakness (generalized)  Other abnormalities of gait and mobility     Problem List Patient Active Problem List   Diagnosis Date Noted  . Vertigo 11/28/2020  . Myelopathy ([facility]) 11/28/2020  . Insomnia 09/03/2020  . Status post coronary artery stent placement   . Mixed hyperlipidemia   . Coronary artery disease involving native coronary artery of native heart with unstable angina pectoris ([place])   . Chest pain 08/11/2020  . Facet arthropathy, lumbar 06/22/2020  . Major depression in partial remission ([place]) 06/04/2020  . Nausea and vomiting 04/14/2020  . Hyperlipidemia associated with type 2 diabetes mellitus ([place]) 04/14/2020  . Primary hypertension 04/07/2020  . Type 2 diabetes mellitus with other specified complication ([place]) 42/87/6811  . SBO (small bowel obstruction) ([place]) 04/06/2020  . Sepsis ([place]) 10/06/2019  . Status post insertion of spinal cord stimulator 05/19/2017  . 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 Erectile dysfunction  10/09/2015   Kerin Perna, PTA 01/12/21 2:00 PM  [place], [place], [address] Phone: [phone]   Fax:  [phone]  Name: Brandon [name] MRN: [id number] Date of Birth: February 29, 1956

## 2021-01-13 ENCOUNTER — Other Ambulatory Visit: Payer: Self-pay | Admitting: Sports Medicine

## 2021-01-13 DIAGNOSIS — M961 Postlaminectomy syndrome, not elsewhere classified: Secondary | ICD-10-CM

## 2021-01-14 MED ORDER — TRAMADOL HCL 50 MG PO TABS
100.0000 mg | ORAL_TABLET | Freq: Three times a day (TID) | ORAL | 0 refills | Status: DC | PRN
  Filled 2021-01-14: qty 180, 30d supply, fill #0

## 2021-01-15 ENCOUNTER — Other Ambulatory Visit (HOSPITAL_COMMUNITY): Payer: Self-pay

## 2021-01-16 ENCOUNTER — Ambulatory Visit (INDEPENDENT_AMBULATORY_CARE_PROVIDER_SITE_OTHER): Payer: No Typology Code available for payment source | Admitting: Endocrinology

## 2021-01-16 ENCOUNTER — Other Ambulatory Visit: Payer: Self-pay

## 2021-01-16 ENCOUNTER — Telehealth: Payer: Self-pay | Admitting: *Deleted

## 2021-01-16 ENCOUNTER — Encounter (HOSPITAL_COMMUNITY): Payer: Self-pay | Admitting: *Deleted

## 2021-01-16 ENCOUNTER — Other Ambulatory Visit (HOSPITAL_COMMUNITY): Payer: Self-pay

## 2021-01-16 ENCOUNTER — Encounter: Payer: No Typology Code available for payment source | Admitting: Rehabilitative and Restorative Service Providers"

## 2021-01-16 VITALS — BP 130/70 | HR 78 | Ht 69.0 in | Wt 242.6 lb

## 2021-01-16 DIAGNOSIS — I25110 Atherosclerotic heart disease of native coronary artery with unstable angina pectoris: Secondary | ICD-10-CM

## 2021-01-16 DIAGNOSIS — E1169 Type 2 diabetes mellitus with other specified complication: Secondary | ICD-10-CM | POA: Diagnosis not present

## 2021-01-16 DIAGNOSIS — Z955 Presence of coronary angioplasty implant and graft: Secondary | ICD-10-CM

## 2021-01-16 LAB — POCT GLYCOSYLATED HEMOGLOBIN (HGB A1C): Hemoglobin A1C: 6.8 % — AB (ref 4.0–5.6)

## 2021-01-16 MED ORDER — TRESIBA FLEXTOUCH 100 UNIT/ML ~~LOC~~ SOPN
35.0000 [IU] | PEN_INJECTOR | Freq: Every day | SUBCUTANEOUS | 3 refills | Status: DC
  Filled 2021-01-16: qty 30, 85d supply, fill #0

## 2021-01-16 MED ORDER — OZEMPIC (0.25 OR 0.5 MG/DOSE) 2 MG/1.5ML ~~LOC~~ SOPN
0.5000 mg | PEN_INJECTOR | SUBCUTANEOUS | 3 refills | Status: DC
  Filled 2021-01-16: qty 4.5, 84d supply, fill #0

## 2021-01-16 NOTE — Telephone Encounter (Signed)
Pt contacted pre-catheterization scheduled at [facility] for: Wednesday Jan 17, 2021 1 PM Verified arrival time and place: [facility] Main Entrance A [facility]) at: 11 AM  No solid food after midnight prior to cath, clear liquids until 5 AM day of procedure.  Hold: Jardiance-AM of procedure Treshiba-AM of procedure/knows 1/2 usual HS Insulin if needed Ozempic-weekly-knows not to use AM of procedure  Except hold medications AM meds can be  taken pre-cath with sips of water including: ASA 81 mg Plavix 75 mg   Confirmed patient has responsible adult to drive home post procedure and be with patient first 24 hours after arriving home: yes  You are allowed ONE visitor in the waiting room during the time you are at the hospital for your procedure. Both you and your visitor must wear a mask once you enter the hospital.   Patient reports does not currently have any symptoms concerning for COVID-19 and no household members with COVID-19 like illness.      Reviewed procedure/mask/visitor instructions with patient.

## 2021-01-16 NOTE — Progress Notes (Signed)
Subjective:    Patient ID: Brandon [name], male    DOB: May 04, 1956, 65 y.o.   MRN: [id number]  HPI Pt returns for f/u of diabetes mellitus: DM type:Insulin-requiring type 2  Dx'ed: 6269 Complications: CAD Therapy: insulin since 2012 DKA: never Severe hypoglycemia: never Pancreatitis: never Pancreatic imaging: normal on 2021 CT SDOH: none Other: he takes multiple daily injections Interval history: He has mild hypoglycemia approx once per week.  This happens at any time of day.  pt states he feels well in general.  I reviewed continuous glucose monitor data.  Glucose varies from 75-245.  It is in general highest at 10AM and 10PM Past Medical History:  Diagnosis Date  . Anxiety   . Basal cell carcinoma (BCC) in situ of skin   . Colon polyps   . Coronary artery disease   . Depression   . Diabetes mellitus without complication ([place])   . Hypertension   . SBO (small bowel obstruction) (HCC)     Past Surgical History:  Procedure Laterality Date  . BACK SURGERY    . CARDIAC CATHETERIZATION    . CATARACT EXTRACTION W/ INTRAOCULAR LENS  IMPLANT, BILATERAL    . CHOLECYSTECTOMY    . CHOLECYSTECTOMY, LAPAROSCOPIC    . CORONARY STENT INTERVENTION N/A 08/15/2020   Procedure: CORONARY STENT INTERVENTION;  Surgeon: Jettie Booze, MD;  Location: [facility];  Service: Cardiovascular;  Laterality: N/A;  . EYE SURGERY    . INTRAVASCULAR ULTRASOUND/IVUS N/A 08/15/2020   Procedure: Intravascular Ultrasound/IVUS;  Surgeon: Jettie Booze, MD;  Location: [facility];  Service: Cardiovascular;  Laterality: N/A;  . LEFT HEART CATH AND CORONARY ANGIOGRAPHY N/A 08/15/2020   Procedure: LEFT HEART CATH AND CORONARY ANGIOGRAPHY;  Surgeon: Jettie Booze, MD;  Location: [facility];  Service: Cardiovascular;  Laterality: N/A;  . LUMBAR FUSION     L3-5 with rods  . XI ROBOTIC ASSISTED INGUINAL HERNIA REPAIR WITH MESH     x 3 surgeries    Social History    Socioeconomic History  . Marital status: Married    Spouse name: Not on file  . Number of children: 4  . Years of education: 39  . Highest education level: Not on file  Occupational History  . Occupation: disabled  Tobacco Use  . Smoking status: Former Smoker    Quit date: 2007    Years since quitting: 15.4  . Smokeless tobacco: Never Used  Vaping Use  . Vaping Use: Not on file  Substance and Sexual Activity  . Alcohol use: Not Currently  . Drug use: Not Currently  . Sexual activity: Yes  Other Topics Concern  . Not on file  Social History Narrative  . Not on file   Social Determinants of Health   Financial Resource Strain: Not on file  Food Insecurity: Not on file  Transportation Needs: Not on file  Physical Activity: Not on file  Stress: Not on file  Social Connections: Not on file  Intimate Partner Violence: Not on file    Current Outpatient Medications on File Prior to Visit  Medication Sig Dispense Refill  . amLODipine (NORVASC) 10 MG tablet Take 10 mg by mouth every morning.    . ARIPiprazole (ABILIFY) 10 MG tablet Take 10 mg by mouth every morning.    Marland Kitchen aspirin 81 MG EC tablet Take 81 mg by mouth every morning.    Marland Kitchen atorvastatin (LIPITOR) 80 MG tablet TAKE 1 TABLET (80 MG TOTAL)  BY MOUTH DAILY. (Patient taking differently: Take 80 mg by mouth daily.) 90 tablet 3  . Blood Glucose Monitoring Suppl (FREESTYLE LITE) w/Device KIT USE AS DIRECTED. 1 kit 99  . Blood Pressure Monitoring (OMRON 3 SERIES BP MONITOR) DEVI USE AS DIRECTED. 1 each 0  . buPROPion (WELLBUTRIN XL) 150 MG 24 hr tablet TAKE 1 TABLET BY MOUTH ONCE DAILY (Patient taking differently: Take 150 mg by mouth See admin instructions. Take with 300 mg for a total of 450 mg daily) 90 tablet 0  . buPROPion (WELLBUTRIN XL) 300 MG 24 hr tablet TAKE 1 TABLET BY MOUTH ONCE DAILY (Patient taking differently: Take 300 mg by mouth See admin instructions. Take with 150 mg for a total of 450 mg daily) 90 tablet 0  .  clopidogrel (PLAVIX) 75 MG tablet TAKE 1 TABLET (75 MG TOTAL) BY MOUTH DAILY WITH BREAKFAST. (Patient taking differently: Take 75 mg by mouth daily with breakfast.) 90 tablet 3  . Continuous Blood Gluc Sensor (DEXCOM G6 SENSOR) MISC Use as directed. Change sensor every 10 days 9 each 3  . empagliflozin (JARDIANCE) 25 MG TABS tablet TAKE 1 TABLET BY MOUTH ONCE DAILY (Patient taking differently: Take 25 mg by mouth daily.) 90 tablet 0  . glucose blood test strip USE 1 TO CHECK BLOOD GLUCOSE UP TO 5 TIMES DAILY. (Patient taking differently: USE 1 TO CHECK BLOOD GLUCOSE UP TO 5 TIMES DAILY.) 300 strip 12  . insulin lispro (HUMALOG) 100 UNIT/ML KwikPen Inject 10-24 Units into the skin 3 (three) times daily before meals. (Patient taking differently: Inject 24 Units into the skin 3 (three) times daily before meals.) 15 mL 11  . isosorbide mononitrate (IMDUR) 30 MG 24 hr tablet Take 1 tablet (30 mg total) by mouth daily. 90 tablet 1  . Lancets (FREESTYLE) lancets USE 1 TO CHECK BLOOD GLUCOSE UP TO 5 TIMES DAILY. (Patient taking differently: USE 1 TO CHECK BLOOD GLUCOSE UP TO 5 TIMES DAILY.) 300 each 12  . losartan (COZAAR) 25 MG tablet Take 1 tablet (25 mg total) by mouth daily. Take only when systolic BP greater than 967. 90 tablet 1  . metoCLOPramide (REGLAN) 10 MG tablet Take 10 mg by mouth daily as needed for nausea or vomiting (upset stomach).    . metoprolol succinate (TOPROL-XL) 50 MG 24 hr tablet Take 1 tablet (50 mg total) by mouth daily. Take with or immediately following a meal. 90 tablet 1  . ondansetron (ZOFRAN ODT) 4 MG disintegrating tablet Take 1 tablet (4 mg total) by mouth every 8 (eight) hours as needed for nausea or vomiting. 20 tablet 0  . pantoprazole (PROTONIX) 40 MG tablet TAKE 1 TABLET BY MOUTH ONCE DAILY (Patient taking differently: Take 40 mg by mouth daily.) 90 tablet 0  . tamsulosin (FLOMAX) 0.4 MG CAPS capsule TAKE 1 CAPSULE BY MOUTH ONCE DAILY. (Patient taking differently: Take 0.4  mg by mouth daily.) 90 capsule 0  . testosterone cypionate (DEPOTESTOSTERONE CYPIONATE) 200 MG/ML injection Inject 1 mL (200 mg total) into the muscle every Sunday. 5 mL 1  . tiZANidine (ZANAFLEX) 4 MG tablet Take 2 tablets (8 mg total) by mouth 3 (three) times daily. 180 tablet 11  . traMADol (ULTRAM) 50 MG tablet Take 2 tablets (100 mg total) by mouth 3 (three) times daily as needed. 180 tablet 0  . triazolam (HALCION) 0.25 MG tablet TAKE 1-2 TABS BY MOUTH 2 HOURS BEFORE PROCEDURE OR IMAGING.DO NOT DRIVE WITH THIS MEDICATION. (Patient taking differently: Take 0.5  mg by mouth See admin instructions. Two hours before procedure) 2 tablet 0  . nitroGLYCERIN (NITROSTAT) 0.4 MG SL tablet Place 1 tablet (0.4 mg total) under the tongue every 5 (five) minutes as needed for chest pain. 60 tablet 0   No current facility-administered medications on file prior to visit.    Allergies  Allergen Reactions  . Kiwi Extract Swelling    Mouth swelling.   . Other Swelling    EGGPLANT. Mouth swelling.  Marland Kitchen Penicillins Rash    Reaction: unknown  . Ancef [Cefazolin] Rash  . Latex Rash  . Levaquin [Levofloxacin] Rash    Family History  Problem Relation Age of Onset  . COPD Mother   . Anxiety disorder Mother   . Depression Mother   . Cancer Father   . Anxiety disorder Father   . Depression Father   . Other Father   . Diabetes Neg Hx     BP 130/70 (BP Location: Right Arm, Patient Position: Sitting, Cuff Size: Large)   Pulse 78   Ht _0  (1.753 m)   Wt 242 lb 9.6 oz (110 kg)   SpO2 94%   BMI 35.83 kg/m    Review of Systems Edema is improved    Objective:   Physical Exam VITAL SIGNS:  See vs page GENERAL: no distress Pulses: dorsalis pedis intact bilat.   MSK: no deformity of the feet CV: 2+ bilat leg edema Skin:  no ulcer on the feet.  normal color and temp on the feet. Neuro: sensation is intact to touch on the feet    Lab Results  Component Value Date   HGBA1C 6.8 (A) 01/16/2021        Assessment & Plan:  Insulin-requiring type 2 DM.  well-controlled.   Obesity: uncontrolled.     Patient Instructions  check your blood sugar twice a day.  vary the time of day when you check, between before the 3 meals, and at bedtime.  also check if you have symptoms of your blood sugar being too high or too low.  please keep a record of the readings and bring it to your next appointment here (or you can bring the meter itself).  You can write it on any piece of paper.  please call us sooner if your blood sugar goes below 70, or if most of your readings are over 200.  We will need to take this complex situation in stages.   I have sent a prescription to your pharmacy, to start [place].  For the first 1 or 2 shots, take just 0.25 mg.   Reduce the Tresiba to 35 units per day, and:  continue the same Humalog: 1 unit/3.5 grams, and:  Please continue the same Jardiance.  Please come back for a follow-up appointment in 2 months.

## 2021-01-16 NOTE — Patient Instructions (Addendum)
check your blood sugar twice a day.  vary the time of day when you check, between before the 3 meals, and at bedtime.  also check if you have symptoms of your blood sugar being too high or too low.  please keep a record of the readings and bring it to your next appointment here (or you can bring the meter itself).  You can write it on any piece of paper.  please call us sooner if your blood sugar goes below 70, or if most of your readings are over 200.  We will need to take this complex situation in stages.   I have sent a prescription to your pharmacy, to start Ozempic.  For the first 1 or 2 shots, take just 0.25 mg.   Reduce the Tresiba to 35 units per day, and:  continue the same Humalog: 1 unit/3.5 grams, and:  Please continue the same Jardiance.  Please come back for a follow-up appointment in 2 months.

## 2021-01-16 NOTE — Progress Notes (Signed)
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  amLODipine (NORVASC) 10 MG tablet, Take 10 mg by mouth every morning., Disp: , Rfl:  .  ARIPiprazole (ABILIFY) 10 MG tablet, Take 10 mg by mouth every morning., Disp: , Rfl:  .  aspirin 81 MG EC tablet, Take 81 mg by mouth every morning., Disp: , Rfl:  .  atorvastatin (LIPITOR) 80 MG tablet, TAKE 1 TABLET (80 MG TOTAL) BY MOUTH DAILY. (Patient taking differently: Take 80 mg by mouth daily.), Disp: 90 tablet, Rfl: 3 .  Blood Glucose Monitoring Suppl (FREESTYLE LITE) w/Device KIT, USE AS DIRECTED., Disp: 1 kit, Rfl: 99 .  Blood Pressure Monitoring (OMRON 3 SERIES BP MONITOR) DEVI, USE AS DIRECTED., Disp: 1 each, Rfl: 0 .  buPROPion (WELLBUTRIN XL) 150 MG 24 hr tablet, TAKE 1 TABLET BY MOUTH ONCE DAILY (Patient taking differently: Take 150 mg by mouth See admin instructions. Take with 300 mg for a total of 450 mg daily), Disp: 90 tablet, Rfl: 0 .  buPROPion (WELLBUTRIN XL) 300 MG 24 hr tablet, TAKE 1 TABLET BY MOUTH ONCE DAILY (Patient taking differently: Take 300 mg by mouth See admin instructions. Take with 150 mg for a total of 450 mg daily), Disp: 90 tablet, Rfl: 0 .  clopidogrel (PLAVIX) 75 MG tablet, TAKE 1 TABLET (75 MG TOTAL) BY MOUTH DAILY WITH BREAKFAST. (Patient taking differently: Take 75 mg by mouth daily with breakfast.), Disp: 90 tablet, Rfl: 3 .  Continuous Blood Gluc Sensor (DEXCOM G6 SENSOR) MISC, Use as directed.  Change sensor every 10 days, Disp: 9 each, Rfl: 3 .  empagliflozin (JARDIANCE) 25 MG TABS tablet, TAKE 1 TABLET BY MOUTH ONCE DAILY (Patient taking differently: Take 25 mg by mouth daily.), Disp: 90 tablet, Rfl: 0 .  glucose blood test strip, USE 1 TO CHECK BLOOD GLUCOSE UP TO 5 TIMES DAILY. (Patient taking differently: USE 1 TO CHECK BLOOD GLUCOSE UP TO 5 TIMES DAILY.), Disp: 300 strip, Rfl: 12 .  insulin degludec (TRESIBA FLEXTOUCH) 100 UNIT/ML FlexTouch Pen, Inject 35 Units into the skin daily., Disp: 45 mL, Rfl: 3 .  insulin lispro (HUMALOG) 100 UNIT/ML KwikPen, Inject 10-24 Units into the skin 3 (three) times daily before meals. (Patient taking differently: Inject 24 Units into the skin 3 (three) times daily before meals.), Disp: 15 mL, Rfl: 11 .  isosorbide mononitrate (IMDUR) 30 MG 24 hr tablet, Take 1 tablet (30 mg total) by mouth daily., Disp: 90 tablet, Rfl: 1 .  Lancets (FREESTYLE) lancets, USE 1 TO CHECK BLOOD GLUCOSE UP TO 5 TIMES DAILY. (Patient taking differently: USE 1 TO CHECK BLOOD GLUCOSE UP TO 5 TIMES DAILY.), Disp: 300 each, Rfl: 12 .  losartan (COZAAR) 25 MG tablet, Take 1 tablet (25 mg total) by mouth daily. Take only when systolic BP greater than 409., Disp: 90 tablet, Rfl: 1 .  metoCLOPramide (REGLAN) 10 MG tablet, Take 10 mg by mouth daily as needed for nausea or vomiting (upset stomach)., Disp: ,  Rfl:  .  metoprolol succinate (TOPROL-XL) 50 MG 24 hr tablet, Take 1 tablet (50 mg total) by mouth daily. Take with or immediately following a meal., Disp: 90 tablet, Rfl: 1 .  nitroGLYCERIN (NITROSTAT) 0.4 MG SL tablet, Place 1 tablet (0.4 mg total) under the tongue every 5 (five) minutes as needed for chest pain., Disp: 60 tablet, Rfl: 0 .  ondansetron (ZOFRAN ODT) 4 MG disintegrating tablet, Take 1 tablet (4 mg total) by mouth every 8 (eight) hours as needed for nausea or vomiting., Disp: 20 tablet, Rfl: 0 .  pantoprazole (PROTONIX) 40 MG tablet, TAKE 1 TABLET BY MOUTH ONCE DAILY  (Patient taking differently: Take 40 mg by mouth daily.), Disp: 90 tablet, Rfl: 0 .  Semaglutide,0.25 or 0.5MG/DOS, (OZEMPIC, 0.25 OR 0.5 MG/DOSE,) 2 MG/1.5ML SOPN, Inject 0.5 mg into the skin once a week., Disp: 4.5 mL, Rfl: 3 .  tamsulosin (FLOMAX) 0.4 MG CAPS capsule, TAKE 1 CAPSULE BY MOUTH ONCE DAILY. (Patient taking differently: Take 0.4 mg by mouth daily.), Disp: 90 capsule, Rfl: 0 .  testosterone cypionate (DEPOTESTOSTERONE CYPIONATE) 200 MG/ML injection, Inject 1 mL (200 mg total) into the muscle every Sunday., Disp: 5 mL, Rfl: 1 .  tiZANidine (ZANAFLEX) 4 MG tablet, Take 2 tablets (8 mg total) by mouth 3 (three) times daily., Disp: 180 tablet, Rfl: 11 .  traMADol (ULTRAM) 50 MG tablet, Take 2 tablets (100 mg total) by mouth 3 (three) times daily as needed., Disp: 180 tablet, Rfl: 0 .  triazolam (HALCION) 0.25 MG tablet, TAKE 1-2 TABS BY MOUTH 2 HOURS BEFORE PROCEDURE OR IMAGING.DO NOT DRIVE WITH THIS MEDICATION. (Patient taking differently: Take 0.5 mg by mouth See admin instructions. Two hours before procedure), Disp: 2 tablet, Rfl: 0  Past Medical History: Past Medical History:  Diagnosis Date  . Anxiety   . Basal cell carcinoma (BCC) in situ of skin   . Colon polyps   . Coronary artery disease   . Depression   . Diabetes mellitus without complication ([place])   . Hypertension   . SBO (small bowel obstruction) ([facility])     Tobacco Use: Social History   Tobacco Use  Smoking Status Former Smoker  . Quit date: 2007  . Years since quitting: 15.4  Smokeless Tobacco Never Used    Labs: Recent Chemical engineer    Labs for ITP Cardiac and Pulmonary Rehab Latest Ref Rng & Units 04/07/2020 08/13/2020 10/23/2020 11/15/2020 01/16/2021   Cholestrol 100 - 199 mg/dL - 160 113 - -   LDLCALC 0 - 99 mg/dL - 76 53 - -   HDL >39 mg/dL - 32(L) 45 - -   Trlycerides 0 - 149 mg/dL - 258(H) 74 - -   Hemoglobin A1c 4.0 - 5.6 % 7.2(H) 10.1(H) - 6.5(A) 6.8(A)      Capillary Blood Glucose: Lab  Results  Component Value Date   GLUCAP 171 (H) 11/15/2020   GLUCAP 255 (H) 11/15/2020   GLUCAP 140 (H) 11/13/2020   GLUCAP 165 (H) 11/13/2020   GLUCAP 135 (H) 08/16/2020     Exercise Target Goals: Exercise Program Goal: Individual exercise prescription set using results from initial 6 min walk test and THRR while considering  patient's activity barriers and safety.   Exercise Prescription Goal: Initial exercise prescription builds to 30-45 minutes a day of aerobic activity, 2-3 days per week.  Home exercise guidelines will be given to patient during program as part of exercise prescription that the participant will acknowledge.  Activity Barriers &  Risk Stratification:  Activity Barriers & Cardiac Risk Stratification - 11/07/20 1044      Activity Barriers & Cardiac Risk Stratification   Activity Barriers Arthritis;Back Problems;Joint Problems;Fibromyalgia;Other (comment)    Comments Back surgery in 2006 with ongoing back pain. L3-L5 fused, S1 joints arthritic and degenerative. ROM in back decreased by 40%. Has spinal cord stimulator. Hip pain with prolonged walking.    Cardiac Risk Stratification Moderate           6 Minute Walk:  6 Minute Walk    Row Name 11/07/20 1054         6 Minute Walk   Phase Initial     Distance 1064 feet     Walk Time 6 minutes     # of Rest Breaks 0     MPH 2.01     METS 2.24     RPE 13     Perceived Dyspnea  1     VO2 Peak 7.84     Symptoms Yes (comment)     Comments Patient reported 6/10 left hip pain and mild SOB, 1/4 on dyspnea scale.     Resting HR 79 bpm     Resting BP 102/60     Resting Oxygen Saturation  98 %     Exercise Oxygen Saturation  during 6 min walk 98 %     Max Ex. HR 86 bpm     Max Ex. BP 134/72     2 Minute Post BP 118/70            Oxygen Initial Assessment:   Oxygen Re-Evaluation:   Oxygen Discharge (Final Oxygen Re-Evaluation):   Initial Exercise Prescription:  Initial Exercise Prescription -  11/07/20 1100      Date of Initial Exercise RX and Referring Provider   Date 11/07/20    Referring Provider Freada Bergeron, MD    Expected Discharge Date 01/05/21      T5 Nustep   Level 2    SPM 85    Minutes 25    METs 1.8      Prescription Details   Frequency (times per week) 3    Duration Progress to 30 minutes of continuous aerobic without signs/symptoms of physical distress      Intensity   THRR 40-80% of Max Heartrate 62-124    Ratings of Perceived Exertion 11-13    Perceived Dyspnea 0-4      Progression   Progression Continue to progress workloads to maintain intensity without signs/symptoms of physical distress.      Resistance Training   Training Prescription Yes    Weight 4 lbs    Reps 10-15           Perform Capillary Blood Glucose checks as needed.  Exercise Prescription Changes:   Exercise Prescription Changes    Row Name 11/13/20 1048 11/20/20 1044 12/08/20 1045 12/18/20 1050 01/01/21 1049     Response to Exercise   Blood Pressure (Admit) 120/60 150/76 122/76 142/64 148/60   Blood Pressure (Exercise) 122/62 128/64 118/58 128/62 166/62   Blood Pressure (Exit) 118/68 118/70 104/58 100/60 148/62   Heart Rate (Admit) 89 bpm 92 bpm 97 bpm 90 bpm 87 bpm   Heart Rate (Exercise) 89 bpm 92 bpm 94 bpm 93 bpm 93 bpm   Heart Rate (Exit) 81 bpm 85 bpm 87 bpm 86 bpm 87 bpm   Rating of Perceived Exertion (Exercise) _0 Symptoms Patient c/o SI joint pain,  which is chronic for him. -- -- -- --   Comments Patient completed 1st session of exercise fair with some joint pain. -- -- -- --   Duration Progress to 30 minutes of  aerobic without signs/symptoms of physical distress Progress to 30 minutes of  aerobic without signs/symptoms of physical distress Progress to 30 minutes of  aerobic without signs/symptoms of physical distress Progress to 30 minutes of  aerobic without signs/symptoms of physical distress Progress to 30 minutes of  aerobic without  signs/symptoms of physical distress   Intensity _0      Progression   Progression Continue to progress workloads to maintain intensity without signs/symptoms of physical distress. Continue to progress workloads to maintain intensity without signs/symptoms of physical distress. Continue to progress workloads to maintain intensity without signs/symptoms of physical distress. Continue to progress workloads to maintain intensity without signs/symptoms of physical distress. Continue to progress workloads to maintain intensity without signs/symptoms of physical distress.   Average METs 1.5 1.7 1.5 1.7 1.9     Resistance Training   Training Prescription _1    Weight 4 lbs 4 lbs 4 lbs 4 lbs 4 lbs   Reps 10-15 10-15 10-15 10-15 10-15   Time 10 Minutes 10 Minutes 10 Minutes 10 Minutes 10 Minutes     Interval Training   Interval Training _2      NuStep   Level _3 SPM 85 85 85 85 85   Minutes _4 METs 1.5 1.7 1.5 1.7 1.9     T5 Nustep   Level -- -- -- -- --   SPM -- -- -- -- --   Minutes -- -- -- -- --   METs -- -- -- -- --     Home Exercise Plan   Plans to continue exercise at -- -- Home (comment)  Walking Home (comment)  Walking Home (comment)  Walking   Frequency -- -- Add 4 additional days to program exercise sessions. Add 4 additional days to program exercise sessions. Add 4 additional days to program exercise sessions.   Initial Home Exercises Provided -- -- 12/08/20 12/08/20 12/08/20          Exercise Comments:   Exercise Comments    Row Name 11/13/20 1141 12/08/20 1049 12/18/20 1139 01/16/21 0754     Exercise Comments Patient completed 1st session of exercise fair with some joint pain. Reviewed home exercise guideline, METs, and goals with patient. Reviewed METs and goals with patient. Patient out since 01/01/21 awaiting cardiac cath scheduled for 01/17/21.            Exercise Goals and Review:   Exercise Goals    Row Name 11/07/20 1045             Exercise Goals   Increase Physical Activity Yes       Intervention Provide advice, education, support and counseling about physical activity/exercise needs.;Develop an individualized exercise prescription for aerobic and resistive training based on initial evaluation findings, risk stratification, comorbidities and participant's personal goals.       Expected Outcomes Short Term: Attend rehab on a regular basis to increase amount of physical activity.;Long Term: Exercising regularly at least 3-5 days a week.;Long Term: Add in home exercise to make exercise part of routine and to increase amount of physical activity.       Increase Strength and  Stamina Yes       Intervention Provide advice, education, support and counseling about physical activity/exercise needs.;Develop an individualized exercise prescription for aerobic and resistive training based on initial evaluation findings, risk stratification, comorbidities and participant's personal goals.       Expected Outcomes Short Term: Increase workloads from initial exercise prescription for resistance, speed, and METs.;Short Term: Perform resistance training exercises routinely during rehab and add in resistance training at home;Long Term: Improve cardiorespiratory fitness, muscular endurance and strength as measured by increased METs and functional capacity (6MWT)       Able to understand and use rate of perceived exertion (RPE) scale Yes       Intervention Provide education and explanation on how to use RPE scale       Expected Outcomes Short Term: Able to use RPE daily in rehab to express subjective intensity level;Long Term:  Able to use RPE to guide intensity level when exercising independently       Knowledge and understanding of Target Heart Rate Range (THRR) Yes       Intervention Provide education and explanation of THRR including how the numbers  were predicted and where they are located for reference       Expected Outcomes Short Term: Able to state/look up THRR;Long Term: Able to use THRR to govern intensity when exercising independently;Short Term: Able to use daily as guideline for intensity in rehab       Able to check pulse independently Yes       Intervention Provide education and demonstration on how to check pulse in carotid and radial arteries.;Review the importance of being able to check your own pulse for safety during independent exercise       Expected Outcomes Short Term: Able to explain why pulse checking is important during independent exercise;Long Term: Able to check pulse independently and accurately       Understanding of Exercise Prescription Yes       Intervention Provide education, explanation, and written materials on patient's individual exercise prescription       Expected Outcomes Short Term: Able to explain program exercise prescription;Long Term: Able to explain home exercise prescription to exercise independently              Exercise Goals Re-Evaluation :  Exercise Goals Re-Evaluation    Row Name 11/13/20 1141 12/08/20 1049 12/18/20 1139 01/16/21 0754       Exercise Goal Re-Evaluation   Exercise Goals Review Able to understand and use rate of perceived exertion (RPE) scale;Increase Physical Activity Able to understand and use rate of perceived exertion (RPE) scale;Increase Physical Activity;Understanding of Exercise Prescription;Increase Strength and Stamina;Knowledge and understanding of Target Heart Rate Range (THRR);Able to check pulse independently Able to understand and use rate of perceived exertion (RPE) scale;Increase Physical Activity;Understanding of Exercise Prescription;Increase Strength and Stamina;Knowledge and understanding of Target Heart Rate Range (THRR);Able to check pulse independently --    Comments Patient able to understand and use RPE scale appropriately. Patient plans to walk 30  minutes 4-7 days/week as his mode of home exercise. Patient is able to manually count his pulse. Patient is walking about 600 yards in 10 minutes, 2-3 days/week. Discussed increasing walking duration, and patient states he can only walk about 6 minutes continuous because of bilateral hip pain. Discussed walking 10 minutes, 3 times/day taking rest breaks as needed at least 2 days/week, and patient is amenable to this. Patient has been out since 01/01/21 awaiting cardiac catheterization. Unable to review goals  at this time.    Expected Outcomes Progress workloads as tolerated to help achieve personal health and fitness goals. Patient will walk 30 minutes, 4 days/ week in addition to exercise at cardiac rehab to help improve cardiorespiratory fitness. Patient will accumulate 30 minutes of walking at least 2 days/week in addition to exercise at cardiac rehab. Will review goals upon return to cardiac rehab.           Discharge Exercise Prescription (Final Exercise Prescription Changes):  Exercise Prescription Changes - 01/01/21 1049      Response to Exercise   Blood Pressure (Admit) 148/60    Blood Pressure (Exercise) 166/62    Blood Pressure (Exit) 148/62    Heart Rate (Admit) 87 bpm    Heart Rate (Exercise) 93 bpm    Heart Rate (Exit) 87 bpm    Rating of Perceived Exertion (Exercise) 11    Duration Progress to 30 minutes of  aerobic without signs/symptoms of physical distress    Intensity THRR unchanged      Progression   Progression Continue to progress workloads to maintain intensity without signs/symptoms of physical distress.    Average METs 1.9      Resistance Training   Training Prescription Yes    Weight 4 lbs    Reps 10-15    Time 10 Minutes      Interval Training   Interval Training No      NuStep   Level 5    SPM 85    Minutes 25    METs 1.9      Home Exercise Plan   Plans to continue exercise at Home (comment)   Walking   Frequency Add 4 additional days to program  exercise sessions.    Initial Home Exercises Provided 12/08/20           Nutrition:  Target Goals: Understanding of nutrition guidelines, daily intake of sodium <1537m, cholesterol <2071m calories 30% from fat and 7% or less from saturated fats, daily to have 5 or more servings of fruits and vegetables.  Biometrics:  Pre Biometrics - 11/07/20 1050      Pre Biometrics   Waist Circumference 46.5 inches    Hip Circumference 43.25 inches    Waist to Hip Ratio 1.08 %    Triceps Skinfold 10 mm    % Body Fat 31.2 %    Grip Strength 44.5 kg    Flexibility --   N/A due to back problems.   Single Leg Stand 5.93 seconds            Nutrition Therapy Plan and Nutrition Goals:  Nutrition Therapy & Goals - 11/24/20 1207      Nutrition Therapy   Diet TLC; carb modified    Drug/Food Interactions Statins/Certain Fruits      Personal Nutrition Goals   Nutrition Goal Pt to identify food quantities necessary to achieve weight loss of 6-24 lb at graduation from cardiac rehab.    Personal Goal #2 Pt to build a healthy plate including vegetables, fruits, whole grains, and low-fat dairy products in a heart healthy meal plan.    Personal Goal #3 Pt to continue checking CBGs and keep WNL      Intervention Plan   Intervention Prescribe, educate and counsel regarding individualized specific dietary modifications aiming towards targeted core components such as weight, hypertension, lipid management, diabetes, heart failure and other comorbidities.;Nutrition handout(s) given to patient.    Expected Outcomes Short Term Goal: A plan has been developed  with personal nutrition goals set during dietitian appointment.;Long Term Goal: Adherence to prescribed nutrition plan.           Nutrition Assessments:  MEDIFICTS Score Key:  ?70 Need to make dietary changes   40-70 Heart Healthy Diet  ? 40 Therapeutic Level Cholesterol Diet   Flowsheet Row CARDIAC REHAB PHASE II EXERCISE from 11/24/2020 in  [facility]  Picture Your Plate Total Score on Admission 42     Picture Your Plate Scores:  <92 Unhealthy dietary pattern with much room for improvement.  41-50 Dietary pattern unlikely to meet recommendations for good health and room for improvement.  51-60 More healthful dietary pattern, with some room for improvement.   >60 Healthy dietary pattern, although there may be some specific behaviors that could be improved.    Nutrition Goals Re-Evaluation:  Nutrition Goals Re-Evaluation    [place] Name 11/24/20 1208 12/15/20 1329 01/16/21 0719         Goals   Current Weight 243 lb (110.2 kg) 241 lb 6.5 oz (109.5 kg) 241 lb 2.9 oz (109.4 kg)     Nutrition Goal Pt to identify food quantities necessary to achieve weight loss of 6-24 lb at graduation from cardiac rehab. Pt to identify food quantities necessary to achieve weight loss of 6-24 lb at graduation from cardiac rehab. Pt to identify food quantities necessary to achieve weight loss of 6-24 lb at graduation from cardiac rehab.     Expected Outcome Weight loss Weight loss Weight loss           Personal Goal #2 Re-Evaluation   Personal Goal #2 Pt to build a healthy plate including vegetables, fruits, whole grains, and low-fat dairy products in a heart healthy meal plan. Pt to build a healthy plate including vegetables, fruits, whole grains, and low-fat dairy products in a heart healthy meal plan. Pt to build a healthy plate including vegetables, fruits, whole grains, and low-fat dairy products in a heart healthy meal plan.           Personal Goal #3 Re-Evaluation   Personal Goal #3 Pt to continue checking CBGs and keep WNL Pt to continue checking CBGs and keep WNL Pt to continue checking CBGs and keep WNL            Nutrition Goals Re-Evaluation:  Nutrition Goals Re-Evaluation    [place] Name 11/24/20 1208 12/15/20 1329 01/16/21 0719         Goals   Current Weight 243 lb (110.2 kg) 241 lb 6.5 oz  (109.5 kg) 241 lb 2.9 oz (109.4 kg)     Nutrition Goal Pt to identify food quantities necessary to achieve weight loss of 6-24 lb at graduation from cardiac rehab. Pt to identify food quantities necessary to achieve weight loss of 6-24 lb at graduation from cardiac rehab. Pt to identify food quantities necessary to achieve weight loss of 6-24 lb at graduation from cardiac rehab.     Expected Outcome Weight loss Weight loss Weight loss           Personal Goal #2 Re-Evaluation   Personal Goal #2 Pt to build a healthy plate including vegetables, fruits, whole grains, and low-fat dairy products in a heart healthy meal plan. Pt to build a healthy plate including vegetables, fruits, whole grains, and low-fat dairy products in a heart healthy meal plan. Pt to build a healthy plate including vegetables, fruits, whole grains, and low-fat dairy products in a heart healthy meal plan.  Personal Goal #3 Re-Evaluation   Personal Goal #3 Pt to continue checking CBGs and keep WNL Pt to continue checking CBGs and keep WNL Pt to continue checking CBGs and keep WNL            Nutrition Goals Discharge (Final Nutrition Goals Re-Evaluation):  Nutrition Goals Re-Evaluation - 01/16/21 0719      Goals   Current Weight 241 lb 2.9 oz (109.4 kg)    Nutrition Goal Pt to identify food quantities necessary to achieve weight loss of 6-24 lb at graduation from cardiac rehab.    Expected Outcome Weight loss      Personal Goal #2 Re-Evaluation   Personal Goal #2 Pt to build a healthy plate including vegetables, fruits, whole grains, and low-fat dairy products in a heart healthy meal plan.      Personal Goal #3 Re-Evaluation   Personal Goal #3 Pt to continue checking CBGs and keep WNL           Psychosocial: Target Goals: Acknowledge presence or absence of significant depression and/or stress, maximize coping skills, provide positive support system. Participant is able to verbalize types and ability to use  techniques and skills needed for reducing stress and depression.  Initial Review & Psychosocial Screening:  Initial Psych Review & Screening - 11/07/20 1201      Initial Review   Current issues with History of Depression;Current Anxiety/Panic;Current Stress Concerns    Source of Stress Concerns Chronic Illness;Unable to perform yard/household activities    Comments [place] has been disabled due to breaking his back in 2006 continues to have problems with back pain. History of depression currently controlled on two antidepressants.      Family Dynamics   Good Support System? Yes   Cheo has his wife and 4 children for suport     Barriers   Psychosocial barriers to participate in program The patient should benefit from training in stress management and relaxation.      Screening Interventions   Interventions Encouraged to exercise;To provide support and resources with identified psychosocial needs;Provide feedback about the scores to participant    Expected Outcomes Long Term Goal: Stressors or current issues are controlled or eliminated.;Short Term goal: Identification and review with participant of any Quality of Life or Depression concerns found by scoring the questionnaire.           Quality of Life Scores:  Quality of Life - 11/07/20 1141      Quality of Life   [facility] Quality of Life      Quality of Life Scores   Health/Function Pre 17.9 %    Socioeconomic Pre 20.33 %    Psych/Spiritual Pre 21.93 %    Family Pre 25 %    GLOBAL Pre 20.27 %          Scores of 19 and below usually indicate a poorer quality of life in these areas.  A difference of  2-3 points is a clinically meaningful difference.  A difference of 2-3 points in the total score of the Quality of Life Index has been associated with significant improvement in overall quality of life, self-image, physical symptoms, and general health in studies assessing change in quality of life.  PHQ-9: Recent Review Flowsheet  Data    Depression screen [facility] 2/9 11/07/2020 06/02/2020   Decreased Interest 0 1   Down, Depressed, Hopeless 0 1   PHQ - 2 Score 0 2   Altered sleeping - 2   Tired, decreased energy -  2   Change in appetite - 2   Feeling bad or failure about yourself  - 0   Trouble concentrating - 0   Moving slowly or fidgety/restless - 0   Suicidal thoughts - 0   PHQ-9 Score - 8   Difficult doing work/chores - Somewhat difficult     Interpretation of Total Score  Total Score Depression Severity:  1-4 = Minimal depression, 5-9 = Mild depression, 10-14 = Moderate depression, 15-19 = Moderately severe depression, 20-27 = Severe depression   Psychosocial Evaluation and Intervention:   Psychosocial Re-Evaluation:  Psychosocial Re-Evaluation    [place] Name 11/20/20 1631 12/18/20 1416 01/16/21 1135         Psychosocial Re-Evaluation   Current issues with History of Depression;Current Stress Concerns;Current Anxiety/Panic History of Depression;Current Stress Concerns;Current Anxiety/Panic History of Depression;Current Stress Concerns;Current Anxiety/Panic     Comments Carney denies having increased depression and anxiety. Quality of life questionnaire forwarded to Dr Zigmund Daniel. Jacari's depression is currently controlled on 2 antidepressants Eryc denies having increased depression and anxiety. Caydence's depression is currently controlled on 2 antidepressants. Frederic mentioned having some stress as adult age children are staying with him after they recenlty moved into a larger apartment. Kieran denies having increased depression and anxiety. Azavier's depression is currently controlled on 2 antidepressants. Calyx's last date of exercise was on 01/01/21 unable to assess at this time     Expected Outcomes Caedan will have less anxiety and depression upon completion of phase 2 cardiac rehab. Rawn will have less anxiety and depression upon completion of phase 2 cardiac rehab. Tel will have less anxiety and depression upon  completion of phase 2 cardiac rehab.     Interventions Relaxation education;Encouraged to attend Cardiac Rehabilitation for the exercise;Stress management education Relaxation education;Encouraged to attend Cardiac Rehabilitation for the exercise;Stress management education Relaxation education;Encouraged to attend Cardiac Rehabilitation for the exercise;Stress management education     Continue Psychosocial Services  Follow up required by staff Follow up required by staff Follow up required by staff     Comments Will continue to monitor and offer suport as needed Will continue to monitor and offer suport as needed Will continue to monitor and offer support as needed           Initial Review   Source of Stress Concerns Chronic Illness;Unable to perform yard/household activities;Retirement/disability Chronic Illness;Unable to perform yard/household activities;Retirement/disability Chronic Illness;Unable to perform yard/household activities;Retirement/disability            Psychosocial Discharge (Final Psychosocial Re-Evaluation):  Psychosocial Re-Evaluation - 01/16/21 1135      Psychosocial Re-Evaluation   Current issues with History of Depression;Current Stress Concerns;Current Anxiety/Panic    Comments Dyland denies having increased depression and anxiety. Lemarcus's depression is currently controlled on 2 antidepressants. Roger's last date of exercise was on 01/01/21 unable to assess at this time    Expected Outcomes Jesiah will have less anxiety and depression upon completion of phase 2 cardiac rehab.    Interventions Relaxation education;Encouraged to attend Cardiac Rehabilitation for the exercise;Stress management education    Continue Psychosocial Services  Follow up required by staff    Comments Will continue to monitor and offer support as needed      Initial Review   Source of Stress Concerns Chronic Illness;Unable to perform yard/household activities;Retirement/disability            Vocational Rehabilitation: Provide vocational rehab assistance to qualifying candidates.   Vocational Rehab Evaluation & Intervention:  Vocational Rehab - 11/07/20 1205  Initial Vocational Rehab Evaluation & Intervention   Assessment shows need for Vocational Rehabilitation No   Kit is disabled and does not need vocational rehab at this time          Education: Education Goals: Education classes will be provided on a weekly basis, covering required topics. Participant will state understanding/return demonstration of topics presented.  Learning Barriers/Preferences:  Learning Barriers/Preferences - 11/07/20 1204      Learning Barriers/Preferences   Learning Barriers Exercise Concerns   Dizziness when standing too quick   Learning Preferences Skilled Demonstration           Education Topics: Count Your Pulse:  -Group instruction provided by verbal instruction, demonstration, patient participation and written materials to support subject.  Instructors address importance of being able to find your pulse and how to count your pulse when at home without a heart monitor.  Patients get hands on experience counting their pulse with staff help and individually.   Heart Attack, Angina, and Risk Factor Modification:  -Group instruction provided by verbal instruction, video, and written materials to support subject.  Instructors address signs and symptoms of angina and heart attacks.    Also discuss risk factors for heart disease and how to make changes to improve heart health risk factors.   Functional Fitness:  -Group instruction provided by verbal instruction, demonstration, patient participation, and written materials to support subject.  Instructors address safety measures for doing things around the house.  Discuss how to get up and down off the floor, how to pick things up properly, how to safely get out of a chair without assistance, and balance training.   Meditation  and Mindfulness:  -Group instruction provided by verbal instruction, patient participation, and written materials to support subject.  Instructor addresses importance of mindfulness and meditation practice to help reduce stress and improve awareness.  Instructor also leads participants through a meditation exercise.    Stretching for Flexibility and Mobility:  -Group instruction provided by verbal instruction, patient participation, and written materials to support subject.  Instructors lead participants through series of stretches that are designed to increase flexibility thus improving mobility.  These stretches are additional exercise for major muscle groups that are typically performed during regular warm up and cool down.   Hands Only CPR:  -Group verbal, video, and participation provides a basic overview of AHA guidelines for community CPR. Role-play of emergencies allow participants the opportunity to practice calling for help and chest compression technique with discussion of AED use.   Hypertension: -Group verbal and written instruction that provides a basic overview of hypertension including the most recent diagnostic guidelines, risk factor reduction with self-care instructions and medication management.    Nutrition I class: Heart Healthy Eating:  -Group instruction provided by PowerPoint slides, verbal discussion, and written materials to support subject matter. The instructor gives an explanation and review of the Therapeutic Lifestyle Changes diet recommendations, which includes a discussion on lipid goals, dietary fat, sodium, fiber, plant stanol/sterol esters, sugar, and the components of a well-balanced, healthy diet.   Nutrition II class: Lifestyle Skills:  -Group instruction provided by PowerPoint slides, verbal discussion, and written materials to support subject matter. The instructor gives an explanation and review of label reading, grocery shopping for heart health,  heart healthy recipe modifications, and ways to make healthier choices when eating out.   Diabetes Question & Answer:  -Group instruction provided by PowerPoint slides, verbal discussion, and written materials to support subject matter. The instructor gives an  explanation and review of diabetes co-morbidities, pre- and post-prandial blood glucose goals, pre-exercise blood glucose goals, signs, symptoms, and treatment of hypoglycemia and hyperglycemia, and foot care basics.   Diabetes Blitz:  -Group instruction provided by PowerPoint slides, verbal discussion, and written materials to support subject matter. The instructor gives an explanation and review of the physiology behind type 1 and type 2 diabetes, diabetes medications and rational behind using different medications, pre- and post-prandial blood glucose recommendations and Hemoglobin A1c goals, diabetes diet, and exercise including blood glucose guidelines for exercising safely.    Portion Distortion:  -Group instruction provided by PowerPoint slides, verbal discussion, written materials, and food models to support subject matter. The instructor gives an explanation of serving size versus portion size, changes in portions sizes over the last 20 years, and what consists of a serving from each food group.   Stress Management:  -Group instruction provided by verbal instruction, video, and written materials to support subject matter.  Instructors review role of stress in heart disease and how to cope with stress positively.     Exercising on Your Own:  -Group instruction provided by verbal instruction, power point, and written materials to support subject.  Instructors discuss benefits of exercise, components of exercise, frequency and intensity of exercise, and end points for exercise.  Also discuss use of nitroglycerin and activating EMS.  Review options of places to exercise outside of rehab.  Review guidelines for sex with heart  disease.   Cardiac Drugs I:  -Group instruction provided by verbal instruction and written materials to support subject.  Instructor reviews cardiac drug classes: antiplatelets, anticoagulants, beta blockers, and statins.  Instructor discusses reasons, side effects, and lifestyle considerations for each drug class.   Cardiac Drugs II:  -Group instruction provided by verbal instruction and written materials to support subject.  Instructor reviews cardiac drug classes: angiotensin converting enzyme inhibitors (ACE-I), angiotensin II receptor blockers (ARBs), nitrates, and calcium channel blockers.  Instructor discusses reasons, side effects, and lifestyle considerations for each drug class.   Anatomy and Physiology of the Circulatory System:  Group verbal and written instruction and models provide basic cardiac anatomy and physiology, with the coronary electrical and arterial systems. Review of: AMI, Angina, Valve disease, Heart Failure, Peripheral Artery Disease, Cardiac Arrhythmia, Pacemakers, and the ICD.   Other Education:  -Group or individual verbal, written, or video instructions that support the educational goals of the cardiac rehab program.   Holiday Eating Survival Tips:  -Group instruction provided by PowerPoint slides, verbal discussion, and written materials to support subject matter. The instructor gives patients tips, tricks, and techniques to help them not only survive but enjoy the holidays despite the onslaught of food that accompanies the holidays.   Knowledge Questionnaire Score:  Knowledge Questionnaire Score - 11/07/20 1141      Knowledge Questionnaire Score   Pre Score 21/24           Core Components/Risk Factors/Patient Goals at Admission:  Personal Goals and Risk Factors at Admission - 11/07/20 1041      Core Components/Risk Factors/Patient Goals on Admission    Weight Management Yes;Obesity;Weight Loss    Intervention Weight Management/Obesity: Establish  reasonable short term and long term weight goals.;Obesity: Provide education and appropriate resources to help participant work on and attain dietary goals.    Admit Weight 241 lb 13.5 oz (109.7 kg)    Goal Weight: Short Term 235 lb (106.6 kg)    Goal Weight: Long Term 199 lb (90.3 kg)  Expected Outcomes Short Term: Continue to assess and modify interventions until short term weight is achieved;Long Term: Adherence to nutrition and physical activity/exercise program aimed toward attainment of established weight goal;Weight Loss: Understanding of general recommendations for a balanced deficit meal plan, which promotes 1-2 lb weight loss per week and includes a negative energy balance of [phone] kcal/d    Diabetes Yes    Intervention Provide education about signs/symptoms and action to take for hypo/hyperglycemia.;Provide education about proper nutrition, including hydration, and aerobic/resistive exercise prescription along with prescribed medications to achieve blood glucose in normal ranges: Fasting glucose 65-99 mg/dL    Expected Outcomes Short Term: Participant verbalizes understanding of the signs/symptoms and immediate care of hyper/hypoglycemia, proper foot care and importance of medication, aerobic/resistive exercise and nutrition plan for blood glucose control.;Long Term: Attainment of HbA1C < 7%.    Hypertension Yes    Intervention Provide education on lifestyle modifcations including regular physical activity/exercise, weight management, moderate sodium restriction and increased consumption of fresh fruit, vegetables, and low fat dairy, alcohol moderation, and smoking cessation.;Monitor prescription use compliance.    Expected Outcomes Short Term: Continued assessment and intervention until BP is < 140/28m HG in hypertensive participants. < 130/825mHG in hypertensive participants with diabetes, heart failure or chronic kidney disease.;Long Term: Maintenance of blood pressure at goal levels.     Lipids Yes    Intervention Provide education and support for participant on nutrition & aerobic/resistive exercise along with prescribed medications to achieve LDL <7075mHDL >66m43m  Expected Outcomes Short Term: Participant states understanding of desired cholesterol values and is compliant with medications prescribed. Participant is following exercise prescription and nutrition guidelines.;Long Term: Cholesterol controlled with medications as prescribed, with individualized exercise RX and with personalized nutrition plan. Value goals: LDL < 70mg43mL > 40 mg.    Stress Yes    Intervention Offer individual and/or small group education and counseling on adjustment to heart disease, stress management and health-related lifestyle change. Teach and support self-help strategies.;Refer participants experiencing significant psychosocial distress to appropriate mental health specialists for further evaluation and treatment. When possible, include family members and significant others in education/counseling sessions.    Expected Outcomes Short Term: Participant demonstrates changes in health-related behavior, relaxation and other stress management skills, ability to obtain effective social support, and compliance with psychotropic medications if prescribed.;Long Term: Emotional wellbeing is indicated by absence of clinically significant psychosocial distress or social isolation.           Core Components/Risk Factors/Patient Goals Review:   Goals and Risk Factor Review    Row Name 11/14/20 0818 01/16/21 1137           Core Components/Risk Factors/Patient Goals Review   Personal Goals Review Weight Management/Obesity;Diabetes;Hypertension;Stress;Lipids Weight Management/Obesity;Diabetes;Hypertension;Stress;Lipids      Review TerryKurkted exercise on 11/13/20. Harris's vital signs and CBG's were stabe TerryJasielbeen doing fair with exercise at cardiac rehab. Vital signs and CBG's have been stable.  TerryAldynbeen having angina at home and is scheduled for a cath on 01/17/21.      Expected Outcomes TerryPaden continue to participate in phase 2 cardiac rehab for exercise nutrition and lifestyle modification TerryDamiel continue to participate in phase 2 cardiac rehab for exercise nutrition and lifestyle modification when he returns to exercise at cardiac rehab             Core Components/Risk Factors/Patient Goals at Discharge (Final Review):   Goals and Risk Factor Review - 01/16/21 1137  Core Components/Risk Factors/Patient Goals Review   Personal Goals Review Weight Management/Obesity;Diabetes;Hypertension;Stress;Lipids    Review Aidon has been doing fair with exercise at cardiac rehab. Vital signs and CBG's have been stable. Iam has been having angina at home and is scheduled for a cath on 01/17/21.    Expected Outcomes Loukas will continue to participate in phase 2 cardiac rehab for exercise nutrition and lifestyle modification when he returns to exercise at cardiac rehab           ITP Comments:  ITP Comments    Row Name 11/07/20 1051 11/14/20 0815 11/20/20 1630 12/18/20 1411 01/16/21 1133   ITP Comments Dr Fransico Him MD, Medical Director 30 Day ITP Review. Burdette started exercise on 11/13/20 and did fair with exercise 30 Day ITP Review. Kodey is off to a good start to exercise despite orthapedic limitations 30 Day ITP Review. Duwan has good particpation in cardiac rehab andf fair attendance. Dannell was out last week due to moving to a new apartment with his family 47 Day ITP Review. Tavoris has good particpation in cardiac rehab. Jaden is scheduled for a heart catherization. San was supposed to complete cardiac rehab on 01/12/21. Will wait for results to determine graduation date.          Comments: See ITP Comments

## 2021-01-17 ENCOUNTER — Ambulatory Visit (HOSPITAL_COMMUNITY)
Admission: RE | Disposition: A | Discharge status: Home / Self Care | Payer: Self-pay | Source: Home / Self Care | Attending: Cardiovascular Disease

## 2021-01-17 ENCOUNTER — Encounter (HOSPITAL_COMMUNITY): Payer: Self-pay | Admitting: Cardiovascular Disease

## 2021-01-17 ENCOUNTER — Other Ambulatory Visit: Payer: Self-pay

## 2021-01-17 ENCOUNTER — Ambulatory Visit (HOSPITAL_COMMUNITY)
Admission: RE | Admit: 2021-01-17 | Discharge: 2021-01-18 | Disposition: A | Discharge status: Home / Self Care | Payer: No Typology Code available for payment source | Attending: Cardiovascular Disease | Admitting: Cardiovascular Disease

## 2021-01-17 DIAGNOSIS — I25118 Atherosclerotic heart disease of native coronary artery with other forms of angina pectoris: Secondary | ICD-10-CM

## 2021-01-17 DIAGNOSIS — Z881 Allergy status to other antibiotic agents status: Secondary | ICD-10-CM | POA: Diagnosis not present

## 2021-01-17 DIAGNOSIS — G4733 Obstructive sleep apnea (adult) (pediatric): Secondary | ICD-10-CM | POA: Diagnosis not present

## 2021-01-17 DIAGNOSIS — E669 Obesity, unspecified: Secondary | ICD-10-CM | POA: Insufficient documentation

## 2021-01-17 DIAGNOSIS — Z6835 Body mass index (BMI) 35.0-35.9, adult: Secondary | ICD-10-CM | POA: Diagnosis not present

## 2021-01-17 DIAGNOSIS — Z87891 Personal history of nicotine dependence: Secondary | ICD-10-CM | POA: Diagnosis not present

## 2021-01-17 DIAGNOSIS — I200 Unstable angina: Secondary | ICD-10-CM | POA: Diagnosis present

## 2021-01-17 DIAGNOSIS — Z7984 Long term (current) use of oral hypoglycemic drugs: Secondary | ICD-10-CM | POA: Diagnosis not present

## 2021-01-17 DIAGNOSIS — Z955 Presence of coronary angioplasty implant and graft: Secondary | ICD-10-CM | POA: Insufficient documentation

## 2021-01-17 DIAGNOSIS — Z794 Long term (current) use of insulin: Secondary | ICD-10-CM | POA: Insufficient documentation

## 2021-01-17 DIAGNOSIS — E119 Type 2 diabetes mellitus without complications: Secondary | ICD-10-CM | POA: Diagnosis present

## 2021-01-17 DIAGNOSIS — E1169 Type 2 diabetes mellitus with other specified complication: Secondary | ICD-10-CM | POA: Diagnosis present

## 2021-01-17 DIAGNOSIS — Z7902 Long term (current) use of antithrombotics/antiplatelets: Secondary | ICD-10-CM | POA: Insufficient documentation

## 2021-01-17 DIAGNOSIS — I2510 Atherosclerotic heart disease of native coronary artery without angina pectoris: Secondary | ICD-10-CM | POA: Diagnosis present

## 2021-01-17 DIAGNOSIS — Z91040 Latex allergy status: Secondary | ICD-10-CM | POA: Diagnosis not present

## 2021-01-17 DIAGNOSIS — E785 Hyperlipidemia, unspecified: Secondary | ICD-10-CM | POA: Diagnosis present

## 2021-01-17 DIAGNOSIS — E782 Mixed hyperlipidemia: Secondary | ICD-10-CM | POA: Insufficient documentation

## 2021-01-17 DIAGNOSIS — I25110 Atherosclerotic heart disease of native coronary artery with unstable angina pectoris: Secondary | ICD-10-CM | POA: Diagnosis not present

## 2021-01-17 DIAGNOSIS — Z880 Allergy status to penicillin: Secondary | ICD-10-CM | POA: Diagnosis not present

## 2021-01-17 DIAGNOSIS — Z9102 Food additives allergy status: Secondary | ICD-10-CM | POA: Diagnosis not present

## 2021-01-17 DIAGNOSIS — Z7982 Long term (current) use of aspirin: Secondary | ICD-10-CM | POA: Insufficient documentation

## 2021-01-17 DIAGNOSIS — I10 Essential (primary) hypertension: Secondary | ICD-10-CM | POA: Diagnosis present

## 2021-01-17 DIAGNOSIS — R079 Chest pain, unspecified: Secondary | ICD-10-CM | POA: Diagnosis present

## 2021-01-17 DIAGNOSIS — Z79899 Other long term (current) drug therapy: Secondary | ICD-10-CM | POA: Insufficient documentation

## 2021-01-17 HISTORY — PX: CORONARY STENT INTERVENTION: CATH118234

## 2021-01-17 HISTORY — PX: CARDIAC CATHETERIZATION: SHX172

## 2021-01-17 HISTORY — PX: LEFT HEART CATH AND CORONARY ANGIOGRAPHY: CATH118249

## 2021-01-17 LAB — GLUCOSE, CAPILLARY
Glucose-Capillary: 122 mg/dL — ABNORMAL HIGH (ref 70–99)
Glucose-Capillary: 121 mg/dL — ABNORMAL HIGH (ref 70–99)
Glucose-Capillary: 176 mg/dL — ABNORMAL HIGH (ref 70–99)

## 2021-01-17 LAB — POCT ACTIVATED CLOTTING TIME: Activated Clotting Time: 303 seconds

## 2021-01-17 SURGERY — LEFT HEART CATH AND CORONARY ANGIOGRAPHY
Anesthesia: LOCAL

## 2021-01-17 MED ORDER — LIDOCAINE HCL (PF) 1 % IJ SOLN
INTRAMUSCULAR | Status: AC
  Filled 2021-01-17: qty 30

## 2021-01-17 MED ORDER — HEPARIN SODIUM (PORCINE) 1000 UNIT/ML IJ SOLN
INTRAMUSCULAR | Status: AC
  Filled 2021-01-17: qty 1

## 2021-01-17 MED ORDER — TAMSULOSIN HCL 0.4 MG PO CAPS
0.4000 mg | ORAL_CAPSULE | Freq: Every day | ORAL | Status: DC
  Administered 2021-01-18: 0.4 mg via ORAL
  Filled 2021-01-17: qty 1

## 2021-01-17 MED ORDER — ISOSORBIDE MONONITRATE ER 30 MG PO TB24
30.0000 mg | ORAL_TABLET | Freq: Every day | ORAL | Status: DC
  Administered 2021-01-18: 30 mg via ORAL
  Filled 2021-01-17: qty 1

## 2021-01-17 MED ORDER — HEPARIN (PORCINE) IN NACL 1000-0.9 UT/500ML-% IV SOLN
INTRAVENOUS | Status: AC
  Filled 2021-01-17: qty 1000

## 2021-01-17 MED ORDER — ASPIRIN 81 MG PO CHEW: 81.0000 mg | CHEWABLE_TABLET | ORAL | Status: DC

## 2021-01-17 MED ORDER — MIDAZOLAM HCL 2 MG/2ML IJ SOLN
INTRAMUSCULAR | Status: DC | PRN
  Administered 2021-01-17: 2 mg via INTRAVENOUS

## 2021-01-17 MED ORDER — LABETALOL HCL 5 MG/ML IV SOLN: 10.0000 mg | INTRAVENOUS | Status: AC | PRN

## 2021-01-17 MED ORDER — ACETAMINOPHEN 325 MG PO TABS: 650.0000 mg | ORAL_TABLET | ORAL | Status: DC | PRN

## 2021-01-17 MED ORDER — AMLODIPINE BESYLATE 5 MG PO TABS
10.0000 mg | ORAL_TABLET | Freq: Every morning | ORAL | Status: DC
  Administered 2021-01-18: 10 mg via ORAL
  Filled 2021-01-17: qty 2

## 2021-01-17 MED ORDER — INSULIN DEGLUDEC 100 UNIT/ML ~~LOC~~ SOPN: 35.0000 [IU] | PEN_INJECTOR | Freq: Every day | SUBCUTANEOUS | Status: DC

## 2021-01-17 MED ORDER — SODIUM CHLORIDE 0.9% FLUSH: 3.0000 mL | INTRAVENOUS | Status: DC | PRN

## 2021-01-17 MED ORDER — EMPAGLIFLOZIN 25 MG PO TABS
25.0000 mg | ORAL_TABLET | Freq: Every day | ORAL | Status: DC
  Administered 2021-01-18: 25 mg via ORAL
  Filled 2021-01-17: qty 1

## 2021-01-17 MED ORDER — LOSARTAN POTASSIUM 50 MG PO TABS
25.0000 mg | ORAL_TABLET | Freq: Every day | ORAL | Status: DC
  Administered 2021-01-18: 25 mg via ORAL
  Filled 2021-01-17: qty 1

## 2021-01-17 MED ORDER — METOPROLOL SUCCINATE ER 25 MG PO TB24
50.0000 mg | ORAL_TABLET | Freq: Every day | ORAL | Status: DC
  Administered 2021-01-18: 50 mg via ORAL
  Filled 2021-01-17: qty 2

## 2021-01-17 MED ORDER — SODIUM CHLORIDE 0.9 % WEIGHT BASED INFUSION
3.0000 mL/kg/h | INTRAVENOUS | Status: DC
  Administered 2021-01-17: 3 mL/kg/h via INTRAVENOUS

## 2021-01-17 MED ORDER — ARIPIPRAZOLE 10 MG PO TABS
10.0000 mg | ORAL_TABLET | Freq: Every morning | ORAL | Status: DC
  Administered 2021-01-18: 10 mg via ORAL
  Filled 2021-01-17: qty 1

## 2021-01-17 MED ORDER — FENTANYL CITRATE (PF) 100 MCG/2ML IJ SOLN
INTRAMUSCULAR | Status: DC | PRN
  Administered 2021-01-17: 50 ug via INTRAVENOUS

## 2021-01-17 MED ORDER — TRAMADOL HCL 50 MG PO TABS
100.0000 mg | ORAL_TABLET | Freq: Every evening | ORAL | Status: DC | PRN
  Administered 2021-01-17: 100 mg via ORAL
  Filled 2021-01-17: qty 2

## 2021-01-17 MED ORDER — NITROGLYCERIN 0.4 MG SL SUBL: 0.4000 mg | SUBLINGUAL_TABLET | SUBLINGUAL | Status: DC | PRN

## 2021-01-17 MED ORDER — MIDAZOLAM HCL 2 MG/2ML IJ SOLN
INTRAMUSCULAR | Status: AC
  Filled 2021-01-17: qty 2

## 2021-01-17 MED ORDER — SODIUM CHLORIDE 0.9 % WEIGHT BASED INFUSION
1.0000 mL/kg/h | INTRAVENOUS | Status: DC
  Administered 2021-01-17: 1 mL/kg/h via INTRAVENOUS

## 2021-01-17 MED ORDER — SODIUM CHLORIDE 0.9 % IV SOLN: INTRAVENOUS | Status: AC

## 2021-01-17 MED ORDER — BUPROPION HCL ER (XL) 300 MG PO TB24: 450.0000 mg | ORAL_TABLET | Freq: Every day | ORAL | Status: DC

## 2021-01-17 MED ORDER — VERAPAMIL HCL 2.5 MG/ML IV SOLN
INTRAVENOUS | Status: AC
  Filled 2021-01-17: qty 2

## 2021-01-17 MED ORDER — HEPARIN (PORCINE) IN NACL 1000-0.9 UT/500ML-% IV SOLN
INTRAVENOUS | Status: DC | PRN
  Administered 2021-01-17 (×2): 500 mL

## 2021-01-17 MED ORDER — LIDOCAINE HCL (PF) 1 % IJ SOLN
INTRAMUSCULAR | Status: DC | PRN
  Administered 2021-01-17: 5 mL

## 2021-01-17 MED ORDER — PANTOPRAZOLE SODIUM 40 MG PO TBEC
40.0000 mg | DELAYED_RELEASE_TABLET | Freq: Every day | ORAL | Status: DC
  Administered 2021-01-18: 40 mg via ORAL
  Filled 2021-01-17: qty 1

## 2021-01-17 MED ORDER — HYDRALAZINE HCL 20 MG/ML IJ SOLN: 10.0000 mg | INTRAMUSCULAR | Status: AC | PRN

## 2021-01-17 MED ORDER — FENTANYL CITRATE (PF) 100 MCG/2ML IJ SOLN
INTRAMUSCULAR | Status: AC
  Filled 2021-01-17: qty 2

## 2021-01-17 MED ORDER — HEPARIN SODIUM (PORCINE) 1000 UNIT/ML IJ SOLN
INTRAMUSCULAR | Status: DC | PRN
  Administered 2021-01-17: 5000 [IU] via INTRAVENOUS
  Administered 2021-01-17: 6000 [IU] via INTRAVENOUS

## 2021-01-17 MED ORDER — NITROGLYCERIN 1 MG/10 ML FOR IR/CATH LAB
INTRA_ARTERIAL | Status: AC
  Filled 2021-01-17: qty 10

## 2021-01-17 MED ORDER — INSULIN GLARGINE 100 UNIT/ML ~~LOC~~ SOLN
35.0000 [IU] | Freq: Every day | SUBCUTANEOUS | Status: DC
  Administered 2021-01-17 – 2021-01-18 (×2): 35 [IU] via SUBCUTANEOUS
  Filled 2021-01-17 (×2): qty 0.35

## 2021-01-17 MED ORDER — VERAPAMIL HCL 2.5 MG/ML IV SOLN
INTRAVENOUS | Status: DC | PRN
  Administered 2021-01-17: 10 mL via INTRA_ARTERIAL

## 2021-01-17 MED ORDER — ONDANSETRON HCL 4 MG/2ML IJ SOLN: 4.0000 mg | Freq: Four times a day (QID) | INTRAMUSCULAR | Status: DC | PRN

## 2021-01-17 MED ORDER — BUPROPION HCL ER (XL) 150 MG PO TB24
450.0000 mg | ORAL_TABLET | Freq: Every day | ORAL | Status: DC
  Administered 2021-01-18: 450 mg via ORAL
  Filled 2021-01-17: qty 3

## 2021-01-17 MED ORDER — ATORVASTATIN CALCIUM 80 MG PO TABS
80.0000 mg | ORAL_TABLET | Freq: Every day | ORAL | Status: DC
  Administered 2021-01-18: 80 mg via ORAL
  Filled 2021-01-17: qty 1

## 2021-01-17 MED ORDER — TIZANIDINE HCL 4 MG PO TABS
8.0000 mg | ORAL_TABLET | Freq: Three times a day (TID) | ORAL | Status: DC
  Administered 2021-01-17 – 2021-01-18 (×3): 8 mg via ORAL
  Filled 2021-01-17 (×5): qty 2

## 2021-01-17 MED ORDER — CLOPIDOGREL BISULFATE 75 MG PO TABS
75.0000 mg | ORAL_TABLET | Freq: Every day | ORAL | Status: DC
  Administered 2021-01-18: 75 mg via ORAL
  Filled 2021-01-17: qty 1

## 2021-01-17 MED ORDER — SODIUM CHLORIDE 0.9% FLUSH
3.0000 mL | Freq: Two times a day (BID) | INTRAVENOUS | Status: DC
  Administered 2021-01-18: 3 mL via INTRAVENOUS

## 2021-01-17 MED ORDER — SODIUM CHLORIDE 0.9 % IV SOLN: 250.0000 mL | INTRAVENOUS | Status: DC | PRN

## 2021-01-17 MED ORDER — ASPIRIN EC 81 MG PO TBEC
81.0000 mg | DELAYED_RELEASE_TABLET | Freq: Every morning | ORAL | Status: DC
  Administered 2021-01-18: 81 mg via ORAL

## 2021-01-17 MED ORDER — IOHEXOL 350 MG/ML SOLN
INTRAVENOUS | Status: DC | PRN
  Administered 2021-01-17: 140 mL

## 2021-01-17 SURGICAL SUPPLY — 17 items
BALLN SAPPHIRE 2.0X12 (BALLOONS) ×2
BALLN SAPPHIRE ~~LOC~~ 3.5X12 (BALLOONS) ×1 IMPLANT
BALLOON SAPPHIRE 2.0X12 (BALLOONS) IMPLANT
CATH INFINITI 5FR MULTPACK ANG (CATHETERS) ×1 IMPLANT
CATH VISTA GUIDE 6FR XBLAD3.5 (CATHETERS) ×1 IMPLANT
DEVICE RAD COMP TR BAND LRG (VASCULAR PRODUCTS) ×1 IMPLANT
GLIDESHEATH SLEND SS 6F .021 (SHEATH) ×1 IMPLANT
GUIDEWIRE INQWIRE 1.5J.035X260 (WIRE) IMPLANT
INQWIRE 1.5J .035X260CM (WIRE) ×2
KIT ENCORE 26 ADVANTAGE (KITS) ×1 IMPLANT
KIT HEART LEFT (KITS) ×2 IMPLANT
PACK CARDIAC CATHETERIZATION (CUSTOM PROCEDURE TRAY) ×2 IMPLANT
STENT RESOLUTE ONYX 2.25X8 (Permanent Stent) ×1 IMPLANT
STENT RESOLUTE ONYX 3.5X15 (Permanent Stent) ×1 IMPLANT
TRANSDUCER W/STOPCOCK (MISCELLANEOUS) ×2 IMPLANT
TUBING CIL FLEX 10 FLL-RA (TUBING) ×2 IMPLANT
WIRE COUGAR XT STRL 190CM (WIRE) ×1 IMPLANT

## 2021-01-17 NOTE — Interval H&P Note (Signed)
History and Physical Interval Note:  01/17/2021 1:03 PM  Brandon [name]  has presented today for surgery, with the diagnosis of angina.  The various methods of treatment have been discussed with the patient and family. After consideration of risks, benefits and other options for treatment, the patient has consented to  Procedure(s): LEFT HEART CATH AND CORONARY ANGIOGRAPHY (N/A) as a surgical intervention.  The patient's history has been reviewed, patient examined, no change in status, stable for surgery.  I have reviewed the patient's chart and labs.  Questions were answered to the patient's satisfaction.    Cath Lab Visit (complete for each Cath Lab visit)  Clinical Evaluation Leading to the Procedure:   ACS: No.  Non-ACS:    Anginal Classification: CCS III  Anti-ischemic medical therapy: Maximal Therapy (2 or more classes of medications)  Non-Invasive Test Results: No non-invasive testing performed  Prior CABG: No previous CABG        Verne Carrow

## 2021-01-18 ENCOUNTER — Encounter (HOSPITAL_COMMUNITY): Payer: Self-pay | Admitting: Cardiovascular Disease

## 2021-01-18 DIAGNOSIS — Z955 Presence of coronary angioplasty implant and graft: Secondary | ICD-10-CM | POA: Diagnosis not present

## 2021-01-18 DIAGNOSIS — I10 Essential (primary) hypertension: Secondary | ICD-10-CM | POA: Diagnosis not present

## 2021-01-18 DIAGNOSIS — E119 Type 2 diabetes mellitus without complications: Secondary | ICD-10-CM

## 2021-01-18 DIAGNOSIS — E7800 Pure hypercholesterolemia, unspecified: Secondary | ICD-10-CM | POA: Diagnosis not present

## 2021-01-18 DIAGNOSIS — I25110 Atherosclerotic heart disease of native coronary artery with unstable angina pectoris: Secondary | ICD-10-CM | POA: Diagnosis not present

## 2021-01-18 LAB — BASIC METABOLIC PANEL
Anion gap: 7 (ref 5–15)
BUN: 12 mg/dL (ref 8–23)
CO2: 25 mmol/L (ref 22–32)
Calcium: 8.2 mg/dL — ABNORMAL LOW (ref 8.9–10.3)
Chloride: 101 mmol/L (ref 98–111)
Creatinine, Ser: 0.96 mg/dL (ref 0.61–1.24)
GFR, Estimated: >60 mL/min (ref >60)
Glucose, Bld: 193 mg/dL — ABNORMAL HIGH (ref 70–99)
Potassium: 3.9 mmol/L (ref 3.5–5.1)
Sodium: 133 mmol/L — ABNORMAL LOW (ref 135–145)

## 2021-01-18 LAB — CBC
HCT: 35.8 % — ABNORMAL LOW (ref 39.0–52.0)
HCT: 42.1 % (ref 39.0–52.0)
Hemoglobin: 10.4 g/dL — ABNORMAL LOW (ref 13.0–17.0)
Hemoglobin: 12.8 g/dL — ABNORMAL LOW (ref 13.0–17.0)
MCH: 23 pg — ABNORMAL LOW (ref 26.0–34.0)
MCH: 23.5 pg — ABNORMAL LOW (ref 26.0–34.0)
MCHC: 29.1 g/dL — ABNORMAL LOW (ref 30.0–36.0)
MCHC: 30.4 g/dL (ref 30.0–36.0)
MCV: 79 fL — ABNORMAL LOW (ref 80.0–100.0)
MCV: 77.2 fL — ABNORMAL LOW (ref 80.0–100.0)
Platelets: 312 10*3/uL (ref 150–400)
Platelets: 361 10*3/uL (ref 150–400)
RBC: 4.53 MIL/uL (ref 4.22–5.81)
RBC: 5.45 MIL/uL (ref 4.22–5.81)
RDW: 15.9 % — ABNORMAL HIGH (ref 11.5–15.5)
RDW: 15.9 % — ABNORMAL HIGH (ref 11.5–15.5)
WBC: 6.5 10*3/uL (ref 4.0–10.5)
WBC: 6.5 10*3/uL (ref 4.0–10.5)
nRBC: 0 % (ref 0.0–0.2)
nRBC: 0 % (ref 0.0–0.2)

## 2021-01-18 LAB — GLUCOSE, CAPILLARY: Glucose-Capillary: 84 mg/dL (ref 70–99)

## 2021-01-18 MED FILL — Nitroglycerin IV Soln 100 MCG/ML in D5W: INTRA_ARTERIAL | Qty: 10 | Status: AC

## 2021-01-18 NOTE — Progress Notes (Signed)
CARDIAC REHAB PHASE I   PRE:  Rate/Rhythm: 84 SR  BP:  Sitting: 157/76      SaO2: 98 RA  MODE:  Ambulation: 450 ft   POST:  Rate/Rhythm: 91 SR  BP:  Sitting: 163/86    SaO2: 99 RA   Pt ambulated 458ft in hallway independently with steady gait. Denies CP, SOB, or dizziness. Pt educated on importance of ASA and Plavix. Reviewed site care, restrictions, and exercise guidelines. Pt states he has a heart healthy and diabetic diet sheet at home from previous stents. Will re-refer to CRP II [place].  [id number] Reynold Bowen, RN BSN 01/18/2021 9:19 AM

## 2021-01-18 NOTE — Plan of Care (Signed)

## 2021-01-18 NOTE — Progress Notes (Signed)
Progress Note  Patient Name: Brandon [name] Date of Encounter: 01/18/2021  [facility] Cardiologist: Meriam Sprague, MD   Subjective   Pt feels well this morning. Questions regarding returning to water PT, cardiac rehab and [facility].   Inpatient Medications    Scheduled Meds: . amLODipine  10 mg Oral q morning  . ARIPiprazole  10 mg Oral q morning  . aspirin EC  81 mg Oral q morning  . atorvastatin  80 mg Oral Daily  . buPROPion  450 mg Oral Daily  . clopidogrel  75 mg Oral Q breakfast  . empagliflozin  25 mg Oral Daily  . insulin glargine  35 Units Subcutaneous Daily  . isosorbide mononitrate  30 mg Oral Daily  . losartan  25 mg Oral Daily  . metoprolol succinate  50 mg Oral Daily  . pantoprazole  40 mg Oral Daily  . sodium chloride flush  3 mL Intravenous Q12H  . tamsulosin  0.4 mg Oral Daily  . tiZANidine  8 mg Oral TID   Continuous Infusions: . sodium chloride     PRN Meds: sodium chloride, acetaminophen, nitroGLYCERIN, ondansetron (ZOFRAN) IV, sodium chloride flush, traMADol   Vital Signs    Vitals:   01/17/21 1739 01/17/21 2120 01/18/21 0115 01/18/21 0435  BP: 130/65 130/64 111/60 (!) 143/73  Pulse: 72 79 71 78  Resp:  19 16 17   Temp:  98.3 F (36.8 C) 98 F (36.7 C) 98.3 F (36.8 C)  TempSrc:  Oral Oral Oral  SpO2: 96% 95% 96% 98%  Weight:      Height:        Intake/Output Summary (Last 24 hours) at 01/18/2021 0748 Last data filed at 01/18/2021 0443 Gross per 24 hour  Intake 2359.01 ml  Output 900 ml  Net 1459.01 ml   Last 3 Weights 01/17/2021 01/17/2021 01/16/2021  Weight (lbs) 237 lb 4.8 oz 233 lb 242 lb 9.6 oz  Weight (kg) 107.639 kg 105.688 kg 110.043 kg      Telemetry    Sinus rhythm HR  - Personally Reviewed  ECG    NSR HR 68 - Personally Reviewed  Physical Exam   GEN: No acute distress.   Neck: No JVD Cardiac: RRR, no murmurs, rubs, or gallops.  Respiratory: Clear to auscultation bilaterally. GI: Soft, nontender,  non-distended  MS: No edema; No deformity. Neuro:  Nonfocal  Psych: Normal affect  Left radial cath site C/D/I  Labs    High Sensitivity Troponin:  No results for input(s): TROPONINIHS in the last 720 hours.    Chemistry Recent Labs  Lab 01/18/21 0102  NA 133*  K 3.9  CL 101  CO2 25  GLUCOSE 193*  BUN 12  CREATININE 0.96  CALCIUM 8.2*  GFRNONAA >60  ANIONGAP 7     Hematology Recent Labs  Lab 01/18/21 0102  WBC 6.5  RBC 4.53  HGB 10.4*  HCT 35.8*  MCV 79.0*  MCH 23.0*  MCHC 29.1*  RDW 15.9*  PLT 312    BNPNo results for input(s): BNP, PROBNP in the last 168 hours.   DDimer No results for input(s): DDIMER in the last 168 hours.   Radiology    CARDIAC CATHETERIZATION  Result Date: 01/17/2021  2nd Diag-2 lesion is 75% stenosed.  2nd Diag-1 lesion is 75% stenosed.  Dist LAD lesion is 50% stenosed.  2nd Mrg lesion is 75% stenosed.  1st Mrg lesion is 100% stenosed.  RPDA lesion is 70% stenosed.  Previously placed  Mid RCA stent (unknown type) is widely patent.  Dist RCA lesion is 50% stenosed.  Ost RCA to Prox RCA lesion is 30% stenosed.  Mid Cx lesion is 90% stenosed.  Ost Cx to Prox Cx lesion is 75% stenosed.  A drug-eluting stent was successfully placed using a STENT RESOLUTE ONYX 3.5X15.  Post intervention, there is a 0% residual stenosis.  A drug-eluting stent was successfully placed using a STENT RESOLUTE ONYX 2.25X8.  Post intervention, there is a 0% residual stenosis.  1. Non-obstructive disease in the LAD. Diffuse disease in the small to moderate caliber Diagonal branch, unchanged from last cath and not a good target for PCI. 2. Severe proximal Circumflex stenosis. Severe mid Circumflex stenosis. Chronic occlusion OM1. Severe diffuse disease in the small caliber second OM branch. 4. Patent proximal to mid RCA stent. Severe disease in the small caliber PDA, unchanged from last cath. 5. Successful PTCA/DES x 1 proximal Circumflex 6. Successful PTCA/DES x  1 mid Circumflex Recommendations: Continue ASA/Plavix for at least one year.    Cardiac Studies   Left heart cath 01/17/21:  2nd Diag-2 lesion is 75% stenosed.  2nd Diag-1 lesion is 75% stenosed.  Dist LAD lesion is 50% stenosed.  2nd Mrg lesion is 75% stenosed.  1st Mrg lesion is 100% stenosed.  RPDA lesion is 70% stenosed.  Previously placed Mid RCA stent (unknown type) is widely patent.  Dist RCA lesion is 50% stenosed.  Ost RCA to Prox RCA lesion is 30% stenosed.  Mid Cx lesion is 90% stenosed.  Ost Cx to Prox Cx lesion is 75% stenosed.  A drug-eluting stent was successfully placed using a STENT RESOLUTE ONYX 3.5X15.  Post intervention, there is a 0% residual stenosis.  A drug-eluting stent was successfully placed using a STENT RESOLUTE ONYX 2.25X8.  Post intervention, there is a 0% residual stenosis.   1. Non-obstructive disease in the LAD. Diffuse disease in the small to moderate caliber Diagonal branch, unchanged from last cath and not a good target for PCI.  2. Severe proximal Circumflex stenosis. Severe mid Circumflex stenosis. Chronic occlusion OM1. Severe diffuse disease in the small caliber second OM branch.  4. Patent proximal to mid RCA stent. Severe disease in the small caliber PDA, unchanged from last cath.  5. Successful PTCA/DES x 1 proximal Circumflex 6. Successful PTCA/DES x 1 mid Circumflex  Recommendations: Continue ASA/Plavix for at least one year.     Patient Profile     65 y.o. male with a hx of CAD s/p DES-mRCA (2021), HTN, HLD, DM, OSA, and obesity. He presented for scheduled heart catheterization after failing medical therapy. He was treated with DES x 2 to p-m Cx.  Assessment & Plan    CAD - widely patent previously placed DES to RCA - placed 2 DES to proximal and mid Cx - ASA, plavix, BB, amlodipine, imdur, losartan - Hb 10.3 (12.5) - no signs of bleeding - left radial cath site C/D/I   Hypertension - medications as above -  follow pressure today - may consider D/C imdur at OP visit   Hyperlipidemia with LDL goal < 70 08/13/2020: VLDL 52 10/23/2020: Cholesterol, Total 113; HDL 45; LDL Chol Calc (NIH) 53; Triglycerides 74 - continue 80 mg lipitor   DM - A1c 6.8% - continue jardiance, ozempic, insulin   For questions or updates, please contact [facility] Please consult [email or web address] for contact info under        Signed, Marcelino Duster, PA  01/18/2021, 7:48 AM

## 2021-01-18 NOTE — Discharge Summary (Signed)
Discharge Summary    Patient ID: Brandon [name] MRN: [id number]; DOB: Dec 19, 1955  Admit date: 01/17/2021 Discharge date: 01/18/2021  PCP:  Brandon Nutting, DO   [facility] Providers Cardiologist:  Brandon Bergeron, MD   {     Discharge Diagnoses    Principal Problem:   Unstable angina [facility]) Active Problems:   Primary hypertension   Type 2 diabetes mellitus with other specified complication ([place])   Hyperlipidemia associated with type 2 diabetes mellitus ([place])   Chest pain   Coronary artery disease involving native coronary artery of native heart with unstable angina pectoris [facility])   Diagnostic Studies/Procedures    Left heart cath 01/17/21:  2nd Diag-2 lesion is 75% stenosed.  2nd Diag-1 lesion is 75% stenosed.  Dist LAD lesion is 50% stenosed.  2nd Mrg lesion is 75% stenosed.  1st Mrg lesion is 100% stenosed.  RPDA lesion is 70% stenosed.  Previously placed Mid RCA stent (unknown type) is widely patent.  Dist RCA lesion is 50% stenosed.  Ost RCA to Prox RCA lesion is 30% stenosed.  Mid Cx lesion is 90% stenosed.  Ost Cx to Prox Cx lesion is 75% stenosed.  A drug-eluting stent was successfully placed using a STENT RESOLUTE ONYX 3.5X15.  Post intervention, there is a 0% residual stenosis.  A drug-eluting stent was successfully placed using a STENT RESOLUTE ONYX 2.25X8.  Post intervention, there is a 0% residual stenosis.  1. Non-obstructive disease in the LAD. Diffuse disease in the small to moderate caliber Diagonal branch, unchanged from last cath and not a good target for PCI.  2. Severe proximal Circumflex stenosis. Severe mid Circumflex stenosis. Chronic occlusion OM1. Severe diffuse disease in the small caliber second OM branch.  4. Patent proximal to mid RCA stent. Severe disease in the small caliber PDA, unchanged from last cath.  5. Successful PTCA/DES x 1 proximal Circumflex 6. Successful PTCA/DES x 1 mid Circumflex  Recommendations:  Continue ASA/Plavix for at least one year.  _____________   History of Present Illness     Brandon [name] is a 65 y.o. male with a hx of CAD s/p DES-mRCA (2021), HTN, HLD, DM, OSA, and obesity. He was seen by Brandon Dopp PA-C for SOB. Anti-anginals were titrated, but he continued to have persistent angina. He presented 01/17/21 for scheduled heart catheterization after failing medical therapy. He was treated with DES x 2 to p-m Cx.    Hospital Course     Consultants:   CAD Hypertension Hyperlipidemia with LDL goal < 70 Widely patent previously placed DES to RCA. Placed 2 DES to proximal and mid Cx. ASA, plavix, BB, amlodipine, imdur, and losartan continued. Hb 112.5 --> 10.3 --> 12.8 on recheck. No signs of bleeding. Will defer discontinuation of imdur at OP visit. Continue 80 mg lipitor.  08/13/2020: VLDL 52 10/23/2020: Cholesterol, Total 113; HDL 45; LDL Chol Calc (NIH) 53; Triglycerides 74   DM A1c much improved to 6.8% on jardiance, ozempic,and insulin.    Pt seen and examined by Dr. Harrell [name] and deemed stable for discharge. I reminded him to avoid pools for at least 1 week. He may gently restart PT for balance issues in 1 week.    Did the patient have an acute coronary syndrome (MI, NSTEMI, STEMI, etc) this admission?:  No                               Did the patient  have a percutaneous coronary intervention (stent / angioplasty)?:  Yes.     Cath/PCI Registry Performance & Quality Measures: 1. Aspirin prescribed? - Yes 2. ADP Receptor Inhibitor (Plavix/Clopidogrel, Brilinta/Ticagrelor or Effient/Prasugrel) prescribed (includes medically managed patients)? - Yes 3. High Intensity Statin (Lipitor 40-108m or Crestor 20-43m prescribed? - Yes 4. For EF <40%, was ACEI/ARB prescribed? - Yes 5. For EF <40%, Aldosterone Antagonist (Spironolactone or Eplerenone) prescribed? - Not Applicable (EF >/= 4088%6. Cardiac Rehab Phase II ordered? - Yes     _____________  Discharge  Vitals Blood pressure (!) 143/73, pulse 78, temperature 98.3 F (36.8 C), temperature source Oral, resp. rate 17, height '5\' 9"'  (1.753 m), weight 107.6 kg, SpO2 98 %.  Filed Weights   01/17/21 1120 01/17/21 1453  Weight: 105.7 kg 107.6 kg    Labs & Radiologic Studies    CBC Recent Labs    01/18/21 0102 01/18/21 0915  WBC 6.5 6.5  HGB 10.4* 12.8*  HCT 35.8* 42.1  MCV 79.0* 77.2*  PLT 312 36416 Basic Metabolic Panel Recent Labs    01/18/21 0102  NA 133*  K 3.9  CL 101  CO2 25  GLUCOSE 193*  BUN 12  CREATININE 0.96  CALCIUM 8.2*   Liver Function Tests No results for input(s): AST, ALT, ALKPHOS, BILITOT, PROT, ALBUMIN in the last 72 hours. No results for input(s): LIPASE, AMYLASE in the last 72 hours. High Sensitivity Troponin:   No results for input(s): TROPONINIHS in the last 720 hours.  BNP Invalid input(s): POCBNP D-Dimer No results for input(s): DDIMER in the last 72 hours. Hemoglobin A1C Recent Labs    01/16/21 1050  HGBA1C 6.8*   Fasting Lipid Panel No results for input(s): CHOL, HDL, LDLCALC, TRIG, CHOLHDL, LDLDIRECT in the last 72 hours. Thyroid Function Tests No results for input(s): TSH, T4TOTAL, T3FREE, THYROIDAB in the last 72 hours.  Invalid input(s): FREET3 _____________  CARDIAC CATHETERIZATION  Result Date: 01/17/2021  2nd Diag-2 lesion is 75% stenosed.  2nd Diag-1 lesion is 75% stenosed.  Dist LAD lesion is 50% stenosed.  2nd Mrg lesion is 75% stenosed.  1st Mrg lesion is 100% stenosed.  RPDA lesion is 70% stenosed.  Previously placed Mid RCA stent (unknown type) is widely patent.  Dist RCA lesion is 50% stenosed.  Ost RCA to Prox RCA lesion is 30% stenosed.  Mid Cx lesion is 90% stenosed.  Ost Cx to Prox Cx lesion is 75% stenosed.  A drug-eluting stent was successfully placed using a STENT RESOLUTE ONYX 3.5X15.  Post intervention, there is a 0% residual stenosis.  A drug-eluting stent was successfully placed using a STENT RESOLUTE  ONYX 2.25X8.  Post intervention, there is a 0% residual stenosis.  1. Non-obstructive disease in the LAD. Diffuse disease in the small to moderate caliber Diagonal branch, unchanged from last cath and not a good target for PCI. 2. Severe proximal Circumflex stenosis. Severe mid Circumflex stenosis. Chronic occlusion OM1. Severe diffuse disease in the small caliber second OM branch. 4. Patent proximal to mid RCA stent. Severe disease in the small caliber PDA, unchanged from last cath. 5. Successful PTCA/DES x 1 proximal Circumflex 6. Successful PTCA/DES x 1 mid Circumflex Recommendations: Continue ASA/Plavix for at least one year.   Disposition   Pt is being discharged home today in good condition.  Follow-up Plans & Appointments     Follow-up Information    WeLiliane ShiPA-C Follow up on 02/09/2021.   Specialties: Cardiology, Physician Assistant Why: 11:45  post cath Contact information: [address] [place] [address] [phone]              Discharge Instructions    Amb Referral to Cardiac Rehabilitation   Complete by: As directed    Diagnosis: Coronary Stents   After initial evaluation and assessments completed: Virtual Based Care may be provided alone or in conjunction with Phase 2 Cardiac Rehab based on patient barriers.: Yes   Diet - low sodium heart healthy   Complete by: As directed    Discharge instructions   Complete by: As directed    No driving for 2 days. No lifting over 5 lbs for 1 week. No sexual activity for 1 week. Keep procedure site clean & dry. If you notice increased pain, swelling, bleeding or pus, call/return!  You may shower, but no soaking baths/hot tubs/pools for 1 week.   Increase activity slowly   Complete by: As directed       Discharge Medications   Allergies as of 01/18/2021      Reactions   Kiwi Extract Swelling   Mouth swelling.   Other Swelling   EGGPLANT. Mouth swelling.   Penicillins Rash   Reaction: unknown    Ancef [cefazolin] Rash   Latex Rash   Levaquin [levofloxacin] Rash      Medication List    TAKE these medications   amLODipine 10 MG tablet Commonly known as: NORVASC Take 10 mg by mouth every morning.   ARIPiprazole 10 MG tablet Commonly known as: ABILIFY Take 10 mg by mouth every morning.   aspirin 81 MG EC tablet Take 81 mg by mouth every morning.   atorvastatin 80 MG tablet Commonly known as: LIPITOR TAKE 1 TABLET (80 MG TOTAL) BY MOUTH DAILY. What changed: how much to take   buPROPion 300 MG 24 hr tablet Commonly known as: WELLBUTRIN XL TAKE 1 TABLET BY MOUTH ONCE DAILY What changed:   how much to take  when to take this  additional instructions   buPROPion 150 MG 24 hr tablet Commonly known as: WELLBUTRIN XL TAKE 1 TABLET BY MOUTH ONCE DAILY What changed:   how much to take  when to take this  additional instructions   clopidogrel 75 MG tablet Commonly known as: PLAVIX TAKE 1 TABLET (75 MG TOTAL) BY MOUTH DAILY WITH BREAKFAST. What changed: how much to take   Dexcom G6 Sensor Misc Use as directed. Change sensor every 10 days   freestyle lancets USE 1 TO CHECK BLOOD GLUCOSE UP TO 5 TIMES DAILY.   FREESTYLE LITE test strip Generic drug: glucose blood USE 1 TO CHECK BLOOD GLUCOSE UP TO 5 TIMES DAILY.   FreeStyle Lite w/Device Kit USE AS DIRECTED.   insulin lispro 100 UNIT/ML KwikPen Commonly known as: HUMALOG Inject 10-24 Units into the skin 3 (three) times daily before meals. What changed: how much to take   isosorbide mononitrate 30 MG 24 hr tablet Commonly known as: IMDUR Take 1 tablet (30 mg total) by mouth daily.   Jardiance 25 MG Tabs tablet Generic drug: empagliflozin TAKE 1 TABLET BY MOUTH ONCE DAILY What changed: how much to take   losartan 25 MG tablet Commonly known as: COZAAR Take 1 tablet (25 mg total) by mouth daily. Take only when systolic BP greater than 341.   metoCLOPramide 10 MG tablet Commonly known as:  REGLAN Take 10 mg by mouth daily as needed for nausea or vomiting (upset stomach).   metoprolol succinate 50 MG  24 hr tablet Commonly known as: TOPROL-XL Take 1 tablet (50 mg total) by mouth daily. Take with or immediately following a meal.   nitroGLYCERIN 0.4 MG SL tablet Commonly known as: NITROSTAT Place 1 tablet (0.4 mg total) under the tongue every 5 (five) minutes as needed for chest pain.   Omron 3 Series BP Monitor Devi USE AS DIRECTED.   ondansetron 4 MG disintegrating tablet Commonly known as: Zofran ODT Take 1 tablet (4 mg total) by mouth every 8 (eight) hours as needed for nausea or vomiting.   Ozempic (0.25 or 0.5 MG/DOSE) 2 MG/1.5ML Sopn Generic drug: Semaglutide(0.25 or 0.5MG/DOS) Inject 0.5 mg into the skin once a week.   pantoprazole 40 MG tablet Commonly known as: PROTONIX TAKE 1 TABLET BY MOUTH ONCE DAILY What changed: how much to take   tamsulosin 0.4 MG Caps capsule Commonly known as: FLOMAX TAKE 1 CAPSULE BY MOUTH ONCE DAILY. What changed: how much to take   testosterone cypionate 200 MG/ML injection Commonly known as: DEPOTESTOSTERONE CYPIONATE Inject 1 mL (200 mg total) into the muscle every Sunday.   tiZANidine 4 MG tablet Commonly known as: ZANAFLEX Take 2 tablets (8 mg total) by mouth 3 (three) times daily.   traMADol 50 MG tablet Commonly known as: ULTRAM Take 2 tablets (100 mg total) by mouth 3 (three) times daily as needed.   Tyler Aas FlexTouch 100 UNIT/ML FlexTouch Pen Generic drug: insulin degludec Inject 35 Units into the skin daily.   triazolam 0.25 MG tablet Commonly known as: HALCION TAKE 1-2 TABS BY MOUTH 2 HOURS BEFORE PROCEDURE OR IMAGING.DO NOT DRIVE WITH THIS MEDICATION. What changed:   how much to take  how to take this  when to take this  additional instructions          Outstanding Labs/Studies   none  Duration of Discharge Encounter   Greater than 30 minutes including physician time.  Signed, [place], PA 01/18/2021, 11:18 AM

## 2021-01-19 ENCOUNTER — Telehealth (HOSPITAL_COMMUNITY): Payer: Self-pay | Admitting: *Deleted

## 2021-01-19 ENCOUNTER — Other Ambulatory Visit (HOSPITAL_COMMUNITY): Payer: Self-pay

## 2021-01-19 ENCOUNTER — Ambulatory Visit: Payer: No Typology Code available for payment source | Admitting: Physical Therapy

## 2021-01-19 NOTE — Telephone Encounter (Signed)
Spoke with Aurther Loft. He is interested in returning to exercise. Will get clearance from Dr Pemberton/Scott Alben Spittle [facility] when the patient can return to exercise.[name], RN,BSN 01/19/2021 11:07 AM

## 2021-01-20 ENCOUNTER — Telehealth: Payer: Self-pay | Admitting: Internal Medicine

## 2021-01-20 ENCOUNTER — Encounter: Payer: Self-pay | Admitting: Rehabilitative and Restorative Service Providers"

## 2021-01-20 NOTE — Telephone Encounter (Signed)
Paged by Brandon [name] earlier in the evening re L radial access site pain/L hand tingling. Brandon [name] reported today that the soreness in his L wrist significantly increased. He is currently trying to ice the access site. He denies any pallor. He has no motor deficits. Sensation is fully intact. He denies any significant bruising or swelling at the site. His hand is a bit "puffy." I advised Brandon [name] to continue using tylenol + warm/cold compresses if they are providing relief. He will call if there is any change in his symptoms.

## 2021-01-23 ENCOUNTER — Encounter: Payer: No Typology Code available for payment source | Admitting: Rehabilitative and Restorative Service Providers"

## 2021-01-24 ENCOUNTER — Other Ambulatory Visit (HOSPITAL_BASED_OUTPATIENT_CLINIC_OR_DEPARTMENT_OTHER): Payer: Self-pay

## 2021-01-24 ENCOUNTER — Telehealth (HOSPITAL_COMMUNITY): Payer: Self-pay | Admitting: *Deleted

## 2021-01-24 NOTE — Telephone Encounter (Signed)
Spoke with Brandon [name] he is interested in resuming cardiac rehab after clearance from cardiology. Will add 12 exercise sessions after follow up appointment with Brandon [name] PAC. [name] wants to attend twice a week as he participates in [employer] on Friday's.[name], RN,BSN 01/24/2021 11:12 AM

## 2021-01-26 ENCOUNTER — Ambulatory Visit (INDEPENDENT_AMBULATORY_CARE_PROVIDER_SITE_OTHER): Payer: No Typology Code available for payment source | Admitting: Physical Therapy

## 2021-01-26 ENCOUNTER — Other Ambulatory Visit: Payer: Self-pay

## 2021-01-26 ENCOUNTER — Encounter: Payer: Self-pay | Admitting: Physical Therapy

## 2021-01-26 DIAGNOSIS — M545 Low back pain, unspecified: Secondary | ICD-10-CM

## 2021-01-26 DIAGNOSIS — M6281 Muscle weakness (generalized): Secondary | ICD-10-CM

## 2021-01-26 DIAGNOSIS — R2689 Other abnormalities of gait and mobility: Secondary | ICD-10-CM | POA: Diagnosis not present

## 2021-01-26 NOTE — Therapy (Addendum)
[place], [place], [address] Phone: [phone]   Fax:  [phone]  Physical Therapy Treatment and REnewal Summary  Patient Details  Name: Brandon [name] MRN: [id number] Date of Birth: [birth year] Referring Provider (PT): Aundria Mems, MD  Physical Therapy Progress Note   Dates of Reporting Period:  12/12/20 thru 01/26/21   Objective Measurements: Berg=40/56, gait speed 2.26 ft/sec, partially met prior LTGs.  Reason Skilled Services are Required: Patient continues with mobility limitations and PT working on improving safety with mobility, balance, and progressing home program.  Thank you for the referral of this patient.    Encounter Date: 01/26/2021   PT End of Session - 01/26/21 1327    Visit Number 10    Number of Visits 12    Authorization Type UMR    PT Start Time 0623    PT Stop Time 1400    PT Time Calculation (min) 45 min    Activity Tolerance Patient tolerated treatment well    Behavior During Therapy WFL for tasks assessed/performed           Past Medical History:  Diagnosis Date  . Anxiety   . Basal cell carcinoma (BCC) in situ of skin   . Colon polyps   . Coronary artery disease   . Depression   . Diabetes mellitus without complication ([place])   . Hypertension   . SBO (small bowel obstruction) (HCC)     Past Surgical History:  Procedure Laterality Date  . BACK SURGERY    . CARDIAC CATHETERIZATION    . CATARACT EXTRACTION W/ INTRAOCULAR LENS  IMPLANT, BILATERAL    . CHOLECYSTECTOMY    . CHOLECYSTECTOMY, LAPAROSCOPIC    . CORONARY STENT INTERVENTION N/A 08/15/2020   Procedure: CORONARY STENT INTERVENTION;  Surgeon: Jettie Booze, MD;  Location: [facility];  Service: Cardiovascular;  Laterality: N/A;  . CORONARY STENT INTERVENTION N/A 01/17/2021   Procedure: CORONARY STENT INTERVENTION;  Surgeon: Burnell Blanks, MD;  Location: [facility];  Service: Cardiovascular;  Laterality: N/A;  . EYE SURGERY    . INTRAVASCULAR ULTRASOUND/IVUS N/A 08/15/2020   Procedure: Intravascular Ultrasound/IVUS;  Surgeon: Jettie Booze, MD;  Location: [facility];  Service: Cardiovascular;  Laterality: N/A;  . LEFT HEART CATH AND CORONARY ANGIOGRAPHY N/A 08/15/2020   Procedure: LEFT HEART CATH AND CORONARY ANGIOGRAPHY;  Surgeon: Jettie Booze, MD;  Location: [facility];  Service: Cardiovascular;  Laterality: N/A;  . LEFT HEART CATH AND CORONARY ANGIOGRAPHY N/A 01/17/2021   Procedure: LEFT HEART CATH AND CORONARY ANGIOGRAPHY;  Surgeon: Burnell Blanks, MD;  Location: [facility];  Service: Cardiovascular;  Laterality: N/A;  . LUMBAR FUSION     L3-5 with rods  . XI ROBOTIC ASSISTED INGUINAL HERNIA REPAIR WITH MESH     x 3 surgeries    There were no vitals filed for this visit.   Subjective Assessment - 01/26/21 1320    Subjective Pt returns to therapy after heart cath proceedure; had 3 stints put in.  He states he can now go up/down flight of stairs without feeling out of breath.  He continues to feel light headed after leaning over to pick up dog poop and if he stands up to quickly from seated position. He notices if he walks too long, he starts dragging LLE.   He has joined the pool.  Improvements he reports:  more flexible- able to get  into garden tub easier and can now cross legs in seated position without grabbing pant leg. He is able to get up from chair with greater easer and less back pain.    Pertinent History diabetes, HTN, h/o low back pain since 2006 (fell in the shower)-- has spinal cord stimulator;  coronary artery disease    Patient Stated Goals to be able to kneel and rise.    Currently in Pain? Yes    Pain Score 2    my baseline is 2-3//10   Pain Location Back    Pain Orientation Mid;Lower    Pain Descriptors / Indicators Discomfort    Aggravating Factors  walking long distances.     Pain Relieving Factors sitting             Pt seen for aquatic therapy today.  Treatment took place in water 3.25-4 ft in depth at the [employer] pool. Temp of water was 91.  Pt entered/exited the pool via stairs independently with single rail.  Treatment:    Holding onto aqua jogger barbell:forward gait. Side stepping. Retro gait.   Holding onto noodle:  TA press x 5 sec x 10 reps, staggered stance row x 5 reps each leg forward for core activation.  High knee raises towards noodle x 10.  Hip abdct x 5  Reps each leg with sit to/from stand in between each rep.   Holding onto pool edge:   heel raises x 15. Hip ext x 12 each leg. Curtsy lunges x 5 each side, then curtsy lunge to lateral knee lift x 5 each side.   Single leg on pool bench (in water) for hamstring stretch x 20 sec, then standing hip flexor stretch x 2 reps per leg. Calf stretch x 15 sec, 2 reps each side. Single arm overhead stretch for QL x 3reps each side (dynamic, no hold)  With no support:  Sit to/from standwith side step L/R on pool bench.SLS with intermittent UE support on pool edge x 15 sec x 2 reps each side. (decreased balance on RLE). Tandem stance x 20 sec x 2 reps, intermittent support to steady.     Pt requires buoyancy for support and to offload joints with strengthening exercises. Viscosity of the water is needed for resistance of strengthening; water current perturbations provides challenge to standing balance unsupported, requiring increased core activation.      PT Long Term Goals - 01/26/21 1319      PT LONG TERM GOAL #1   Title The patient will return demo HEP for flexibility, lumbar mobility, and core stability.    Time 6    Period Weeks    Status On-going      PT LONG TERM GOAL #2   Title The patient will demonstrate ability to reach to the ground (to pick up after dogs) and return to standing without lightheadedness.    Baseline improved (since medication change),  but still gets light headed if moving too quickly.    Time 6    Period Weeks    Status Partially Met      PT LONG TERM GOAL #3   Title The patient will improve hamstring flexibility R side to -30 from full extension (90/90 supine starting position).    Time 6    Period Weeks    Status On-going      PT LONG TERM GOAL #4   Title The patient will be further assessed on Berg balance, and gait speed and goals to  follow as indicated.    Time 6    Period Weeks    Status On-going      PT LONG TERM GOAL #5   Title The patient will be able to move floor<>stand with UE support due to h/o falls.    Time 6    Period Weeks    Status On-going            UPDATED LONG TERM GOALS:  PT Long Term Goals - 01/29/21 0753      PT LONG TERM GOAL #1   Title The patient will return demo HEP progression for flexibility, lumbar mobility, and core stability.    Time 6    Period Weeks    Status Revised    Target Date 03/09/21      PT LONG TERM GOAL #2   Title The patient will improve Berg balance score from 40/56 to > or equal to 46/56 to demo dec'd risk for falls.    Time 6    Period Weeks    Target Date 03/09/21      PT LONG TERM GOAL #3   Title The patient will improve hamstring flexibility R side to -30 from full extension (90/90 supine starting position).    Time 6    Period Weeks    Status Revised    Target Date 03/09/21      PT LONG TERM GOAL #4   Title The patient will be further assessed on Berg balance, and gait speed and goals to follow as indicated.    Baseline Berg on 4/26 was 40/56 and gait speed 2.26 ft/sec    Time 6    Period Weeks    Status Achieved    Target Date --      PT LONG TERM GOAL #5   Title The patient will be able to move floor<>stand with UE support due to h/o falls.    Time 6    Period Weeks    Status Revised    Target Date 03/09/21               Plan - 01/26/21 1337    Clinical Impression Statement Pt reporting improved LBP and improved  mobility with less pain.  He tolerated all exercises in water without increase in pain. Flexibility improving; able to put foot on bench in water for hamstring stretch with straight back without difficulty now.  Rt SLS balance continues to be challenging.  Pt is making good gains towards LTGs; will benefit from continued PT intervention to maximize functional mobility with less pain.    Comorbidities diabetes, HTN, h/o low back pain since 2006 (fell in the shower)-- has spinal cord stimulator; coronary artery disease    Rehab Potential Good    PT Frequency 2x / week    PT Duration 6 weeks    PT Treatment/Interventions Patient/family education;ADLs/Self Care Home Management;Aquatic Therapy;Neuromuscular re-education;Therapeutic exercise;Therapeutic activities;Gait training;Taping;Dry needling;Manual techniques;Vestibular    PT Next Visit Plan progress gait and LE strengthening, lumbar mobility (h/o fusion) to tolerance, flexibility.  Assess BERG, hamstring flexibility, and ability to kneel to rise from floor.    PT Home Exercise Plan [email or web address]    [id number] and Agree with Plan of Care Patient           Patient will benefit from skilled therapeutic intervention in order to improve the following deficits and impairments:  Pain,Dizziness,Decreased range of motion,Decreased strength,Hypomobility,Impaired flexibility,Postural dysfunction,Decreased balance,Decreased activity tolerance  Visit Diagnosis: Bilateral low back pain without sciatica,  unspecified chronicity  Muscle weakness (generalized)  Other abnormalities of gait and mobility     Problem List Patient Active Problem List   Diagnosis Date Noted  . Unstable angina ([place])   . Vertigo 11/28/2020  . Myelopathy ([place]) 11/28/2020  . Insomnia 09/03/2020  . Status post coronary artery stent placement   . Mixed hyperlipidemia   . Coronary artery disease involving native coronary artery of native heart with unstable angina pectoris ([place])   .  Chest pain 08/11/2020  . Facet arthropathy, lumbar 06/22/2020  . Major depression in partial remission ([place]) 06/04/2020  . Nausea and vomiting 04/14/2020  . Hyperlipidemia associated with type 2 diabetes mellitus ([place]) 04/14/2020  . Primary hypertension 04/07/2020  . Type 2 diabetes mellitus with other specified complication ([place]) 02/89/0228  . SBO (small bowel obstruction) ([place]) 04/06/2020  . Sepsis ([place]) 10/06/2019  . Status post insertion of spinal cord stimulator 05/19/2017  . 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 Erectile dysfunction 10/09/2015   Rudell Cobb, PT  Kerin Perna, PTA 01/26/21 2:06 PM  [place], [place], [address] Phone: [phone]   Fax:  [phone]  Name: KYLIN DUBS MRN: [id number] Date of Birth: 1956/03/31

## 2021-01-29 NOTE — Addendum Note (Signed)
Addended by: Margretta Ditty M on: 01/29/2021 08:01 AM   Modules accepted: Orders

## 2021-01-30 ENCOUNTER — Other Ambulatory Visit: Payer: Self-pay

## 2021-01-30 ENCOUNTER — Ambulatory Visit (INDEPENDENT_AMBULATORY_CARE_PROVIDER_SITE_OTHER): Payer: No Typology Code available for payment source | Admitting: Rehabilitative and Restorative Service Providers"

## 2021-01-30 ENCOUNTER — Other Ambulatory Visit (HOSPITAL_COMMUNITY): Payer: Self-pay

## 2021-01-30 DIAGNOSIS — M6281 Muscle weakness (generalized): Secondary | ICD-10-CM

## 2021-01-30 DIAGNOSIS — M545 Low back pain, unspecified: Secondary | ICD-10-CM

## 2021-01-30 DIAGNOSIS — R2689 Other abnormalities of gait and mobility: Secondary | ICD-10-CM | POA: Diagnosis not present

## 2021-01-30 NOTE — Therapy (Signed)
bowel obstruction) (HCC) 04/06/2020  . Sepsis (HCC) 10/06/2019  . Status post insertion of spinal cord stimulator 05/19/2017  . 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 Erectile dysfunction 10/09/2015    Othel Hoogendoorn,  PT 01/30/2021, 3:06 PM  [facility] [address] [place], [place], [address] Phone: [phone]   Fax:  [phone]  Name: Brandon [name] MRN: [id number] Date of Birth: November 19, 1955  [facility] [place] [address] [place], [place], [address] Phone: [phone]   Fax:  [phone]  Physical Therapy Treatment  Patient Details  Name: Brandon [name] MRN: [id number] Date of Birth: 1955/09/11 Referring Provider (PT): Rodney Langton, MD   Encounter Date: 01/30/2021   PT End of Session - 01/30/21 1021    Visit Number 11    Number of Visits 22    Date for PT Re-Evaluation 03/09/21    Authorization Type UMR    PT Start Time 1015    PT Stop Time 1100    PT Time Calculation (min) 45 min    Activity Tolerance Patient tolerated treatment well    Behavior During Therapy [facility] for tasks assessed/performed           Past Medical History:  Diagnosis Date  . Anxiety   . Basal cell carcinoma (BCC) in situ of skin   . Colon polyps   . Coronary artery disease   . Depression   . Diabetes mellitus without complication (HCC)   . Hypertension   . SBO (small bowel obstruction) (HCC)     Past Surgical History:  Procedure Laterality Date  . BACK SURGERY    . CARDIAC CATHETERIZATION    . CATARACT EXTRACTION W/ INTRAOCULAR LENS  IMPLANT, BILATERAL    . CHOLECYSTECTOMY    . CHOLECYSTECTOMY, LAPAROSCOPIC    . CORONARY STENT INTERVENTION N/A 08/15/2020   Procedure: CORONARY STENT INTERVENTION;  Surgeon: Corky Crafts, MD;  [facility];  Service: Cardiovascular;  Laterality: N/A;  . CORONARY STENT INTERVENTION N/A 01/17/2021   Procedure: CORONARY STENT INTERVENTION;  Surgeon: Kathleene Hazel, MD;  Location: [facility];  Service: Cardiovascular;  Laterality: N/A;  . EYE SURGERY    . INTRAVASCULAR ULTRASOUND/IVUS N/A 08/15/2020   Procedure: Intravascular Ultrasound/IVUS;  Surgeon: Corky Crafts, MD;  Location: [facility];  Service: Cardiovascular;  Laterality: N/A;  . LEFT HEART CATH AND CORONARY ANGIOGRAPHY N/A 08/15/2020   Procedure: LEFT HEART CATH AND CORONARY  ANGIOGRAPHY;  Surgeon: Corky Crafts, MD;  Location: [facility];  Service: Cardiovascular;  Laterality: N/A;  . LEFT HEART CATH AND CORONARY ANGIOGRAPHY N/A 01/17/2021   Procedure: LEFT HEART CATH AND CORONARY ANGIOGRAPHY;  Surgeon: Kathleene Hazel, MD;  Location: [facility];  Service: Cardiovascular;  Laterality: N/A;  . LUMBAR FUSION     L3-5 with rods  . XI ROBOTIC ASSISTED INGUINAL HERNIA REPAIR WITH MESH     x 3 surgeries    There were no vitals filed for this visit.   Subjective Assessment - 01/30/21 1016    Subjective Patient notes if he gets in a hurry or gets stressed, dizziness and lightheadedness can show up.  He is returning to cardiac rehab after his next cardiology visit.  His back is not bad today (he took pain meds-- takes them 3x/day.)    Pertinent History diabetes, HTN, h/o low back pain since 2006 (fell in the shower)-- has spinal cord stimulator;  coronary artery disease    Patient Stated Goals to be able to kneel and rise.    Currently in Pain? No/denies              [facility] PT Assessment - 01/30/21 1022      Assessment   Medical Diagnosis vertigo  "Vestibular rehabilitation for vertigo as well as gait training and low back strengthening for lumbar spondylosis."    Referring  [facility] [place] [address] [place], [place], [address] Phone: [phone]   Fax:  [phone]  Physical Therapy Treatment  Patient Details  Name: Brandon [name] MRN: [id number] Date of Birth: 1955/09/11 Referring Provider (PT): Rodney Langton, MD   Encounter Date: 01/30/2021   PT End of Session - 01/30/21 1021    Visit Number 11    Number of Visits 22    Date for PT Re-Evaluation 03/09/21    Authorization Type UMR    PT Start Time 1015    PT Stop Time 1100    PT Time Calculation (min) 45 min    Activity Tolerance Patient tolerated treatment well    Behavior During Therapy [facility] for tasks assessed/performed           Past Medical History:  Diagnosis Date  . Anxiety   . Basal cell carcinoma (BCC) in situ of skin   . Colon polyps   . Coronary artery disease   . Depression   . Diabetes mellitus without complication (HCC)   . Hypertension   . SBO (small bowel obstruction) (HCC)     Past Surgical History:  Procedure Laterality Date  . BACK SURGERY    . CARDIAC CATHETERIZATION    . CATARACT EXTRACTION W/ INTRAOCULAR LENS  IMPLANT, BILATERAL    . CHOLECYSTECTOMY    . CHOLECYSTECTOMY, LAPAROSCOPIC    . CORONARY STENT INTERVENTION N/A 08/15/2020   Procedure: CORONARY STENT INTERVENTION;  Surgeon: Corky Crafts, MD;  [facility]: [facility];  Service: Cardiovascular;  Laterality: N/A;  . CORONARY STENT INTERVENTION N/A 01/17/2021   Procedure: CORONARY STENT INTERVENTION;  Surgeon: Kathleene Hazel, MD;  [facility]: [facility];  Service: Cardiovascular;  Laterality: N/A;  . EYE SURGERY    . INTRAVASCULAR ULTRASOUND/IVUS N/A 08/15/2020   Procedure: Intravascular Ultrasound/IVUS;  Surgeon: Corky Crafts, MD;  Location: [facility];  Service: Cardiovascular;  Laterality: N/A;  . LEFT HEART CATH AND CORONARY ANGIOGRAPHY N/A 08/15/2020   Procedure: LEFT HEART CATH AND CORONARY  ANGIOGRAPHY;  Surgeon: Corky Crafts, MD;  Location: [facility];  Service: Cardiovascular;  Laterality: N/A;  . LEFT HEART CATH AND CORONARY ANGIOGRAPHY N/A 01/17/2021   Procedure: LEFT HEART CATH AND CORONARY ANGIOGRAPHY;  Surgeon: Kathleene Hazel, MD;  [facility];  Service: Cardiovascular;  Laterality: N/A;  . LUMBAR FUSION     L3-5 with rods  . XI ROBOTIC ASSISTED INGUINAL HERNIA REPAIR WITH MESH     x 3 surgeries    There were no vitals filed for this visit.   Subjective Assessment - 01/30/21 1016    Subjective Patient notes if he gets in a hurry or gets stressed, dizziness and lightheadedness can show up.  He is returning to cardiac rehab after his next cardiology visit.  His back is not bad today (he took pain meds-- takes them 3x/day.)    Pertinent History diabetes, HTN, h/o low back pain since 2006 (fell in the shower)-- has spinal cord stimulator;  coronary artery disease    Patient Stated Goals to be able to kneel and rise.    Currently in Pain? No/denies              [facility] PT Assessment - 01/30/21 1022      Assessment   Medical Diagnosis vertigo  "Vestibular rehabilitation for vertigo as well as gait training and low back strengthening for lumbar spondylosis."    Referring  [facility] [place] [address] [place], [place], [address] Phone: [phone]   Fax:  [phone]  Physical Therapy Treatment  Patient Details  Name: Brandon [name] MRN: [id number] Date of Birth: 1955/09/11 Referring Provider (PT): Rodney Langton, MD   Encounter Date: 01/30/2021   PT End of Session - 01/30/21 1021    Visit Number 11    Number of Visits 22    Date for PT Re-Evaluation 03/09/21    Authorization Type UMR    PT Start Time 1015    PT Stop Time 1100    PT Time Calculation (min) 45 min    Activity Tolerance Patient tolerated treatment well    Behavior During Therapy [facility] for tasks assessed/performed           Past Medical History:  Diagnosis Date  . Anxiety   . Basal cell carcinoma (BCC) in situ of skin   . Colon polyps   . Coronary artery disease   . Depression   . Diabetes mellitus without complication (HCC)   . Hypertension   . SBO (small bowel obstruction) (HCC)     Past Surgical History:  Procedure Laterality Date  . BACK SURGERY    . CARDIAC CATHETERIZATION    . CATARACT EXTRACTION W/ INTRAOCULAR LENS  IMPLANT, BILATERAL    . CHOLECYSTECTOMY    . CHOLECYSTECTOMY, LAPAROSCOPIC    . CORONARY STENT INTERVENTION N/A 08/15/2020   Procedure: CORONARY STENT INTERVENTION;  Surgeon: Corky Crafts, MD;  Location: [facility];  Service: Cardiovascular;  Laterality: N/A;  . CORONARY STENT INTERVENTION N/A 01/17/2021   Procedure: CORONARY STENT INTERVENTION;  Surgeon: Kathleene Hazel, MD;  [facility];  Service: Cardiovascular;  Laterality: N/A;  . EYE SURGERY    . INTRAVASCULAR ULTRASOUND/IVUS N/A 08/15/2020   Procedure: Intravascular Ultrasound/IVUS;  Surgeon: Corky Crafts, MD;  Location: [facility] INVASIVE [facility];  Service: Cardiovascular;  Laterality: N/A;  . LEFT HEART CATH AND CORONARY ANGIOGRAPHY N/A 08/15/2020   Procedure: LEFT HEART CATH AND CORONARY  ANGIOGRAPHY;  Surgeon: Corky Crafts, MD;  Location: [facility] INVASIVE [facility];  Service: Cardiovascular;  Laterality: N/A;  . LEFT HEART CATH AND CORONARY ANGIOGRAPHY N/A 01/17/2021   Procedure: LEFT HEART CATH AND CORONARY ANGIOGRAPHY;  Surgeon: Kathleene Hazel, MD;  [facility];  Service: Cardiovascular;  Laterality: N/A;  . LUMBAR FUSION     L3-5 with rods  . XI ROBOTIC ASSISTED INGUINAL HERNIA REPAIR WITH MESH     x 3 surgeries    There were no vitals filed for this visit.   Subjective Assessment - 01/30/21 1016    Subjective Patient notes if he gets in a hurry or gets stressed, dizziness and lightheadedness can show up.  He is returning to cardiac rehab after his next cardiology visit.  His back is not bad today (he took pain meds-- takes them 3x/day.)    Pertinent History diabetes, HTN, h/o low back pain since 2006 (fell in the shower)-- has spinal cord stimulator;  coronary artery disease    Patient Stated Goals to be able to kneel and rise.    Currently in Pain? No/denies              [facility] PT Assessment - 01/30/21 1022      Assessment   Medical Diagnosis vertigo  "Vestibular rehabilitation for vertigo as well as gait training and low back strengthening for lumbar spondylosis."    Referring

## 2021-01-30 NOTE — Patient Instructions (Signed)
Access Code: [email or web address] URL: [email or web address] Date: 01/30/2021 Prepared by: Margretta Ditty  Exercises Gastroc Stretch on Wall - 2 x daily - 7 x weekly - 1 sets - 2 reps - 30 seconds hold Wall Quarter Squat - 2 x daily - 7 x weekly - 1 sets - 5 reps Standing with Forearms Thoracic Rotation - 2 x daily - 7 x weekly - 1 sets - 10 reps Romberg Stance with Eyes Closed - 2 x daily - 7 x weekly - 1 sets - 3 reps - 20-30 seconds hold Heel Walking - 2 x daily - 7 x weekly - 1 sets - 10 reps Toe Walking - 2 x daily - 7 x weekly - 1 sets - 10 reps Single Leg Stance with Support - 2 x daily - 7 x weekly - 1 sets - 3 reps - 10-15 seconds hold Sit to Stand with Armchair - 2 x daily - 7 x weekly - 1 sets - 5 reps Supine Hamstring Stretch with Strap - 2 x daily - 7 x weekly - 1 sets - 3 reps - 30 seconds hold

## 2021-02-02 ENCOUNTER — Ambulatory Visit (INDEPENDENT_AMBULATORY_CARE_PROVIDER_SITE_OTHER): Payer: No Typology Code available for payment source | Admitting: Physical Therapy

## 2021-02-02 ENCOUNTER — Encounter: Payer: Self-pay | Admitting: Physical Therapy

## 2021-02-02 ENCOUNTER — Other Ambulatory Visit: Payer: Self-pay

## 2021-02-02 DIAGNOSIS — M545 Low back pain, unspecified: Secondary | ICD-10-CM

## 2021-02-02 DIAGNOSIS — M6281 Muscle weakness (generalized): Secondary | ICD-10-CM

## 2021-02-02 DIAGNOSIS — R2689 Other abnormalities of gait and mobility: Secondary | ICD-10-CM

## 2021-02-02 NOTE — Therapy (Signed)
[facility] [place] [address] [place], [place], [address] Phone: [phone]   Fax:  [phone]  Physical Therapy Treatment  Patient Details  Name: Brandon [name] MRN: [id number] Date of Birth: 03-May-1956 Referring Provider (PT): Rodney Langton, MD   Encounter Date: 02/02/2021   PT End of Session - 02/02/21 1320     Visit Number 12    Number of Visits 22    Date for PT Re-Evaluation 03/09/21    Authorization Type UMR    PT Start Time 1313    PT Stop Time 1400    PT Time Calculation (min) 47 min    Activity Tolerance Patient tolerated treatment well    Behavior During Therapy [facility] for tasks assessed/performed             Past Medical History:  Diagnosis Date   Anxiety    Basal cell carcinoma (BCC) in situ of skin    Colon polyps    Coronary artery disease    Depression    Diabetes mellitus without complication (HCC)    Hypertension    SBO (small bowel obstruction) (HCC)     Past Surgical History:  Procedure Laterality Date   BACK SURGERY     CARDIAC CATHETERIZATION     CATARACT EXTRACTION W/ INTRAOCULAR LENS  IMPLANT, BILATERAL     CHOLECYSTECTOMY     CHOLECYSTECTOMY, LAPAROSCOPIC     CORONARY STENT INTERVENTION N/A 08/15/2020   Procedure: CORONARY STENT INTERVENTION;  Surgeon: Corky Crafts, MD;  Location: [facility] INVASIVE CV LAB;  Service: Cardiovascular;  Laterality: N/A;   CORONARY STENT INTERVENTION N/A 01/17/2021   Procedure: CORONARY STENT INTERVENTION;  Surgeon: Kathleene Hazel, MD;  Location: [facility] INVASIVE CV LAB;  Service: Cardiovascular;  Laterality: N/A;   EYE SURGERY     INTRAVASCULAR ULTRASOUND/IVUS N/A 08/15/2020   Procedure: Intravascular Ultrasound/IVUS;  Surgeon: Corky Crafts, MD;  Location: [facility] INVASIVE CV LAB;  Service: Cardiovascular;  Laterality: N/A;   LEFT HEART CATH AND CORONARY ANGIOGRAPHY N/A 08/15/2020   Procedure: LEFT HEART CATH AND CORONARY ANGIOGRAPHY;   Surgeon: Corky Crafts, MD;  Location: [facility] INVASIVE CV LAB;  Service: Cardiovascular;  Laterality: N/A;   LEFT HEART CATH AND CORONARY ANGIOGRAPHY N/A 01/17/2021   Procedure: LEFT HEART CATH AND CORONARY ANGIOGRAPHY;  Surgeon: Kathleene Hazel, MD;  Location: [facility] INVASIVE CV LAB;  Service: Cardiovascular;  Laterality: N/A;   LUMBAR FUSION     L3-5 with rods   XI ROBOTIC ASSISTED INGUINAL HERNIA REPAIR WITH MESH     x 3 surgeries    There were no vitals filed for this visit.   Subjective Assessment - 02/02/21 1320     Subjective Pt reports he was able to bend over and pick up dog poop this morning without taking one knee or getting dizzy.  He returns to cardiologist on 17th.    Pertinent History diabetes, HTN, h/o low back pain since 2006 (fell in the shower)-- has spinal cord stimulator;  coronary artery disease    Currently in Pain? Yes    Pain Location Back    Pain Orientation Lower    Pain Descriptors / Indicators Discomfort    Aggravating Factors  walking long distances, twisting    Pain Relieving Factors sitting                [facility] PT Assessment - 02/02/21 0001       Assessment   Medical Diagnosis vertigo  "Vestibular rehabilitation for  vertigo as well as gait training and low back strengthening for lumbar spondylosis."    Referring Provider (PT) Rodney Langton, MD    Onset Date/Surgical Date 11/28/20             Pt seen for aquatic therapy today.  Treatment took place in water 3.25-4 ft in depth at the [facility] pool. Temp of water was 91.  Pt entered/exited the pool via stairs independently with bilat rail.  Treatment:   Holding onto aqua jogger barbell: forward gait. Side stepping. Retro gait. Tandem forward gait (challenging)   Holding onto pool edge:    heel raises x 15. Hip ext x 12 each leg. Hip abdct x 10 each, then second set with 3 pulses x 5 reps each side. Tandem forward gait. Single leg outward hip circles.  Modified  downward dog to lumbar ext x 5 reps.    Single leg on pool bench (in water) for hamstring stretch x 20 sec, then standing hip flexor stretch x 2 reps per leg. Single arm overhead stretch for QL x  5 reps each side (dynamic, no hold), calf stretch x 20 sec x 2 reps each side.     With no support:  Sit to/from stand with side step L/R on pool bench.  Seated foot lift off and press down for abdominal engagement x 10 reps Ai chi postures: floating, uplifting, enclosing, soothing and gathering x 10 each.  Pt requires buoyancy for support and to offload joints with strengthening exercises. Viscosity of the water is needed for resistance of strengthening; water current perturbations provides challenge to standing balance unsupported, requiring increased core activation.       PT Long Term Goals - 01/30/21 1038       PT LONG TERM GOAL #1   Title The patient will return demo HEP progression for flexibility, lumbar mobility, and core stability.    Time 6    Period Weeks    Status Revised      PT LONG TERM GOAL #2   Title The patient will improve Berg balance score from 40/56 to > or equal to 46/56 to demo dec'd risk for falls.    Baseline 48/56 on 01/30/21    Time 6    Period Weeks    Status Achieved      PT LONG TERM GOAL #3   Title The patient will improve hamstring flexibility R side to -30 from full extension (90/90 supine starting position).    Baseline -25 on 01/30/21    Time 6    Period Weeks    Status Achieved      PT LONG TERM GOAL #4   Title The patient will be further assessed on Berg balance, and gait speed and goals to follow as indicated.    Baseline Berg on 4/26 was 40/56 and gait speed 2.26 ft/sec    Time 6    Period Weeks    Status Achieved      PT LONG TERM GOAL #5   Title The patient will be able to move floor<>stand with UE support due to h/o falls.    Time 6    Period Weeks    Status Revised                   Plan - 02/02/21 1335     Clinical  Impression Statement Pt moving in water with much greater speed and ease than previous sessions.  he had difficulty attaining narrow base of  support with tandem gait; required UE support on edge. There was extra resistance in water from additional swimmers being present.  pt continues to make good gains each visit.    Comorbidities diabetes, HTN, h/o low back pain since 2006 (fell in the shower)-- has spinal cord stimulator; coronary artery disease    PT Frequency 2x / week    PT Duration 6 weeks    PT Treatment/Interventions Patient/family education;ADLs/Self Care Home Management;Aquatic Therapy;Neuromuscular re-education;Therapeutic exercise;Therapeutic activities;Gait training;Taping;Dry needling;Manual techniques;Vestibular    PT Next Visit Plan progress floor<>stand, work on functional squats, hamstring flexibility and work to remaining LTGs.    PT Home Exercise Plan [id number]    Consulted and Agree with Plan of Care Patient             Patient will benefit from skilled therapeutic intervention in order to improve the following deficits and impairments:  Pain, Dizziness, Decreased range of motion, Decreased strength, Hypomobility, Impaired flexibility, Postural dysfunction, Decreased balance, Decreased activity tolerance  Visit Diagnosis: Bilateral low back pain without sciatica, unspecified chronicity  Muscle weakness (generalized)  Other abnormalities of gait and mobility     Problem List Patient Active Problem List   Diagnosis Date Noted   Unstable angina (HCC)    Vertigo 11/28/2020   Myelopathy (HCC) 11/28/2020   Insomnia 09/03/2020   Status post coronary artery stent placement    Mixed hyperlipidemia    Coronary artery disease involving native coronary artery of native heart with unstable angina pectoris (HCC)    Chest pain 08/11/2020   Facet arthropathy, lumbar 06/22/2020   Major depression in partial remission (HCC) 06/04/2020   Nausea and vomiting 04/14/2020    Hyperlipidemia associated with type 2 diabetes mellitus (HCC) 04/14/2020   Primary hypertension 04/07/2020   Type 2 diabetes mellitus with other specified complication (HCC) 04/07/2020   SBO (small bowel obstruction) (HCC) 04/06/2020   Sepsis (HCC) 10/06/2019   Status post insertion of spinal cord stimulator 05/19/2017   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   Erectile dysfunction 10/09/2015   Mayer Camel, PTA 02/02/21 1:43 PM  [facility] [place] [address] [place], [place], [address] Phone: [phone]   Fax:  [phone]  Name: ZYHIR CAPPELLA MRN: [id number] Date of Birth: 12/18/1955

## 2021-02-04 ENCOUNTER — Ambulatory Visit (HOSPITAL_BASED_OUTPATIENT_CLINIC_OR_DEPARTMENT_OTHER): Payer: No Typology Code available for payment source | Attending: Cardiology | Admitting: Cardiology

## 2021-02-04 ENCOUNTER — Other Ambulatory Visit: Payer: Self-pay

## 2021-02-04 DIAGNOSIS — F5101 Primary insomnia: Secondary | ICD-10-CM

## 2021-02-04 DIAGNOSIS — G4733 Obstructive sleep apnea (adult) (pediatric): Secondary | ICD-10-CM | POA: Insufficient documentation

## 2021-02-04 DIAGNOSIS — G4734 Idiopathic sleep related nonobstructive alveolar hypoventilation: Secondary | ICD-10-CM | POA: Diagnosis not present

## 2021-02-04 DIAGNOSIS — R0902 Hypoxemia: Secondary | ICD-10-CM | POA: Diagnosis not present

## 2021-02-05 ENCOUNTER — Other Ambulatory Visit (HOSPITAL_BASED_OUTPATIENT_CLINIC_OR_DEPARTMENT_OTHER): Payer: Self-pay

## 2021-02-05 DIAGNOSIS — F5101 Primary insomnia: Secondary | ICD-10-CM

## 2021-02-05 DIAGNOSIS — G4733 Obstructive sleep apnea (adult) (pediatric): Secondary | ICD-10-CM

## 2021-02-05 NOTE — Procedures (Signed)
Patient Name: Brandon [name], Brandon [name] Date: 02/04/2021 Gender: Male D.O.B: Jun 28, 1956 Age (years): 58 Referring Provider: Gwyndolyn Kaufman MD Height (inches): 53 Interpreting Physician: Fransico Him MD, ABSM Weight (lbs): 231 RPSGT: Gwenyth Allegra BMI: 34 MRN: [id number] Neck Size: 18.00  CLINICAL INFORMATION Sleep Study Type: Split Night CPAP  Indication for sleep study: Diabetes, Hypertension, OSA  Epworth Sleepiness Score: 16  SLEEP STUDY TECHNIQUE As per the AASM Manual for the Scoring of Sleep and Associated Events v2.3 (April 2016) with a hypopnea requiring 4% desaturations.  The channels recorded and monitored were frontal, central and occipital EEG, electrooculogram (EOG), submentalis EMG (chin), nasal and oral airflow, thoracic and abdominal wall motion, anterior tibialis EMG, snore microphone, electrocardiogram, and pulse oximetry. Continuous positive airway pressure (CPAP) was initiated when the patient met split night criteria and was titrated according to treat sleep-disordered breathing.  MEDICATIONS Medications self-administered by patient taken the night of the study : N/A  RESPIRATORY PARAMETERS Diagnostic Total AHI (/hr): 45.7  RDI (/hr):50.7  OA Index (/hr): 15.1  CA Index (/hr): 3.0 REM AHI (/hr): 47.3  NREM AHI (/hr):45.1  Supine AHI (/hr):48.8  Non-supine AHI (/hr):40.5 Min O2 Sat (%):54.0  Mean O2 (%): 88.1  Time below 88% (min):39.3   Titration Optimal Pressure (cm):N/A  AHI at Optimal Pressure (/hr):N/A  Min O2 at Optimal Pressure (%):87.0 Supine % at Optimal (%):N/A  Sleep % at Optimal (%):N/A   SLEEP ARCHITECTURE The recording time for the entire night was 374 minutes.  During a baseline period of 139.0 minutes, the patient slept for 119.5 minutes in REM and nonREM, yielding a sleep efficiency of 86.0%. Sleep onset after lights out was 4.8 minutes with a REM latency of 58.0 minutes. The patient spent 14.2% of the night in stage N1  sleep, 58.2% in stage N2 sleep, 0.0% in stage N3 and 27.6% in REM.  During the titration period of 234.0 minutes, the patient slept for 133.0 minutes in REM and nonREM, yielding a sleep efficiency of 56.8%. Sleep onset after CPAP initiation was 20.3 minutes with a REM latency of 184.5 minutes. The patient spent 23.7% of the night in stage N1 sleep, 67.7% in stage N2 sleep, 0.0% in stage N3 and 8.6% in REM.  CARDIAC DATA The 2 lead EKG demonstrated sinus rhythm. The mean heart rate was 100.0 beats per minute. Other EKG findings include: None.  LEG MOVEMENT DATA The total Periodic Limb Movements of Sleep (PLMS) were 0. The PLMS index was 0.0 .  IMPRESSIONS - Severe obstructive sleep apnea occurred during the diagnostic portion of the study (AHI = 45.7/hour). An optimal PAP pressure could not be selected for this patient based on the available study data due to ongoing respiratory events.  - No significant central sleep apnea occurred during the diagnostic portion of the study (CAI = 3.0/hour). - Mild oxygen desaturation was noted during the diagnostic portion of the study (Min O2 = 54.0%). - The patient snored with moderate snoring volume during the diagnostic portion of the study. - No cardiac abnormalities were noted during this study. - Clinically significant periodic limb movements did not occur during sleep.  DIAGNOSIS - Obstructive Sleep Apnea (G47.33) - Nocturnal Hypoxemia - Unsuccessul CPAP titration  RECOMMENDATIONS - Recommend BiPAP titration given sub-optimal CPAP titration. - Avoid alcohol, sedatives and other CNS depressants that may worsen sleep apnea and disrupt normal sleep architecture. - Sleep hygiene should be reviewed to assess factors that may improve sleep quality. - Weight management and regular exercise should  be initiated or continued.  [Electronically signed] 02/05/2021 11:00 AM  Fransico Him MD, ABSM Diplomate, American Board of Sleep Medicine   NPI:  [id number]

## 2021-02-07 ENCOUNTER — Other Ambulatory Visit (HOSPITAL_COMMUNITY): Payer: Self-pay

## 2021-02-07 ENCOUNTER — Other Ambulatory Visit: Payer: Self-pay | Admitting: Family Medicine

## 2021-02-08 ENCOUNTER — Other Ambulatory Visit (HOSPITAL_COMMUNITY): Payer: Self-pay

## 2021-02-08 ENCOUNTER — Encounter: Payer: No Typology Code available for payment source | Admitting: Rehabilitative and Restorative Service Providers"

## 2021-02-08 MED ORDER — EMPAGLIFLOZIN 25 MG PO TABS
25.0000 mg | ORAL_TABLET | Freq: Every day | ORAL | 0 refills | Status: DC
  Filled 2021-02-08: qty 90, 90d supply, fill #0

## 2021-02-08 NOTE — Progress Notes (Signed)
Cardiology Office Note:    Date:  02/09/2021   ID:  Brandon [name], DOB 15-Nov-1955, MRN [id number]  PCP:  Brandon Nutting, DO   [facility] Providers Cardiologist:  Freada Bergeron, MD      Referring MD: Brandon Nutting, DO   Chief Complaint:  Hospitalization Follow-up (S/p PCI)    Patient Profile:    Brandon [name] is a 65 y.o. male with:  Coronary artery disease S/p DES to [facility] in 12/21 Residual small vessel dz in RPDA, OM1, OM2, D1 and apical LAD; pLCx 8 (target for PCI if +Symptoms) Chest pain post PCI >> tx briefly with Colchicine but later [facility] due to no symptoms S/p DES to LCx x 2 in 12/2020 Diff dz in small to mod Dx (not a good target for PCI); OM1 100 CTO; severe dz in small OM2 and PDA >> Med Rx Hypertension Hyperlipidemia Diabetes mellitus OSA Obesity    Prior CV studies: Cardiac catheterization Feb 05, 2021 LAD dist 50; D2 75 LCx ost 75, mid 79; OM1 100 (L-L collaterals); OM2 75 RCA ost 30, mid stent patent, dist 50; RPDA 70 PCI 3.5 x 15 mm Resolute Onyx DES to oLCx and 2.25 x 8 mm Resolute Onyx DES to mLCx       Echocardiogram 08/13/20 EF 60-65, no RWMA, mild LVH, Gr 1 DD   History of Present Illness: [name] was last seen in 5/22.  He had continued symptoms of angina.  Repeat cardiac catheterization was arranged.  He underwent PCI with DES to the proximal LCx and mid LCx.  He does have residual disease with stenosis in a small to moderate diagonal, chronically occluded OM1, severe stenosis in a small caliber OM2 and severe disease in a small PDA.  Residual disease is to be treated medically (not amenable to PCI).  He returns for follow-up.  He is here alone. He is feeling much better since his most recent PCI.  He has not had further chest pain.  He has improved exercise tolerance.  He has not been short of breath.  He has not had orthopnea, significant leg edema or syncope.  He does get lightheaded from time to time.        Past Medical History:   Diagnosis Date   Anxiety    Basal cell carcinoma (BCC) in situ of skin    Colon polyps    Coronary artery disease    S/p DES to mRCA in 12/21 // S/p DES to LCx x 2 in 12/2020 // Diff dz in small to mod Dx (not a good target for PCI); OM1 100 CTO; severe dz in small OM2 and PDA >> Med Rx   Depression    Diabetes mellitus without complication (HCC)    HLD (hyperlipidemia)    Hypertension    SBO (small bowel obstruction) (HCC)     Current Medications: Current Meds  Medication Sig   ARIPiprazole (ABILIFY) 10 MG tablet Take 10 mg by mouth every morning.   aspirin 81 MG EC tablet Take 81 mg by mouth every morning.   atorvastatin (LIPITOR) 80 MG tablet TAKE 1 TABLET (80 MG TOTAL) BY MOUTH DAILY.   Blood Glucose Monitoring Suppl (FREESTYLE LITE) w/Device KIT USE AS DIRECTED.   Blood Pressure Monitoring (OMRON 3 SERIES BP MONITOR) DEVI USE AS DIRECTED.   buPROPion (WELLBUTRIN XL) 150 MG 24 hr tablet Take 1 tablet (150 mg total) by mouth daily.   buPROPion (WELLBUTRIN XL) 300 MG 24 hr tablet Take 1 tablet (  300 mg total) by mouth daily.   clopidogrel (PLAVIX) 75 MG tablet TAKE 1 TABLET (75 MG TOTAL) BY MOUTH DAILY WITH BREAKFAST.   Continuous Blood Gluc Sensor (DEXCOM G6 SENSOR) MISC Use as directed. Change sensor every 10 days   empagliflozin (JARDIANCE) 25 MG TABS tablet Take 1 tablet (25 mg total) by mouth daily.   glucose blood test strip USE 1 TO CHECK BLOOD GLUCOSE UP TO 5 TIMES DAILY. (Patient taking differently: USE 1 TO CHECK BLOOD GLUCOSE UP TO 5 TIMES DAILY.)   insulin degludec (TRESIBA FLEXTOUCH) 100 UNIT/ML FlexTouch Pen Inject 35 Units into the skin daily.   insulin lispro (HUMALOG) 100 UNIT/ML KwikPen Inject 10-24 Units into the skin 3 (three) times daily before meals. (Patient taking differently: Inject 24 Units into the skin 3 (three) times daily before meals.)   isosorbide mononitrate (IMDUR) 30 MG 24 hr tablet Take 1 tablet (30 mg total) by mouth daily.   Lancets (FREESTYLE)  lancets USE 1 TO CHECK BLOOD GLUCOSE UP TO 5 TIMES DAILY. (Patient taking differently: USE 1 TO CHECK BLOOD GLUCOSE UP TO 5 TIMES DAILY.)   losartan (COZAAR) 25 MG tablet Take 1 tablet (25 mg total) by mouth daily. Take only when systolic BP greater than 277.   metoCLOPramide (REGLAN) 10 MG tablet Take 10 mg by mouth daily as needed for nausea or vomiting (upset stomach).   metoprolol succinate (TOPROL-XL) 50 MG 24 hr tablet Take 1 tablet (50 mg total) by mouth daily. Take with or immediately following a meal.   nitroGLYCERIN (NITROSTAT) 0.4 MG SL tablet Place 1 tablet (0.4 mg total) under the tongue every 5 (five) minutes as needed for chest pain.   ondansetron (ZOFRAN ODT) 4 MG disintegrating tablet Take 1 tablet (4 mg total) by mouth every 8 (eight) hours as needed for nausea or vomiting.   pantoprazole (PROTONIX) 40 MG tablet TAKE 1 TABLET BY MOUTH ONCE DAILY   Semaglutide,0.25 or 0.5MG/DOS, (OZEMPIC, 0.25 OR 0.5 MG/DOSE,) 2 MG/1.5ML SOPN Inject 0.5 mg into the skin once a week.   tamsulosin (FLOMAX) 0.4 MG CAPS capsule TAKE 1 CAPSULE BY MOUTH ONCE DAILY.   testosterone cypionate (DEPOTESTOSTERONE CYPIONATE) 200 MG/ML injection Inject 1 mL (200 mg total) into the muscle every Sunday.   tiZANidine (ZANAFLEX) 4 MG tablet Take 2 tablets (8 mg total) by mouth 3 (three) times daily.   traMADol (ULTRAM) 50 MG tablet Take 2 tablets (100 mg total) by mouth 3 (three) times daily as needed.   triazolam (HALCION) 0.25 MG tablet TAKE 1-2 TABS BY MOUTH 2 HOURS BEFORE PROCEDURE OR IMAGING.DO NOT DRIVE WITH THIS MEDICATION.   [DISCONTINUED] amLODipine (NORVASC) 10 MG tablet Take 10 mg by mouth every morning.     Allergies:   Kiwi extract, Other, Penicillins, Ancef [cefazolin], Latex, and Levaquin [levofloxacin]   Social History   Tobacco Use   Smoking status: Former    Pack years: 0.00    Types: Cigarettes    Quit date: 2007    Years since quitting: 15.4   Smokeless tobacco: Never  Substance Use  Topics   Alcohol use: Not Currently   Drug use: Not Currently     Family Hx: The patient's family history includes Anxiety disorder in his father and mother; COPD in his mother; Cancer in his father; Depression in his father and mother; Other in his father. There is no history of Diabetes.  Review of Systems  Neurological:  Positive for light-headedness and paresthesias (bilateral arms with certain head  positions).    EKGs/Labs/Other Test Reviewed:    EKG:  EKG is   ordered today.  The ekg ordered today demonstrates normal sinus rhythm, heart rate 88, normal axis, no ST-T wave changes, QTC 433, no change from prior tracing  Recent Labs: 04/16/2020: Magnesium 1.9 08/13/2020: TSH 2.345 10/23/2020: ALT 12 01/18/2021: BUN 12; Creatinine, Ser 0.96; Hemoglobin 12.8; Platelets 361; Potassium 3.9; Sodium 133   Recent Lipid Panel Lab Results  Component Value Date/Time   CHOL 113 10/23/2020 08:24 AM   TRIG 74 10/23/2020 08:24 AM   HDL 45 10/23/2020 08:24 AM   LDLCALC 53 10/23/2020 08:24 AM      Risk Assessment/Calculations:      Physical Exam:     Today's Vitals   02/09/21 1119 02/09/21 1204  BP: 90/60 90/60  Pulse: 88   SpO2: 95%   Weight: 239 lb (108.4 kg)   Height: $Remove'5\' 9"'IwrcWuf$  (1.753 m)    Body mass index is 35.29 kg/m.   Wt Readings from Last 3 Encounters:  02/09/21 239 lb (108.4 kg)  02/04/21 231 lb (104.8 kg)  01/17/21 237 lb 4.8 oz (107.6 kg)     Constitutional:      Appearance: Healthy appearance. Not in distress.  Neck:     Vascular: JVD normal.  Pulmonary:     Breath sounds: No wheezing. No rales.  Cardiovascular:     Normal rate. Regular rhythm. Normal S1. Normal S2.      Murmurs: There is no murmur.     Comments: L wrist without hematoma Bilateral radial pulses 2+ No supraclavicular bruits bilaterally Edema:    Peripheral edema absent.  Abdominal:     Palpations: Abdomen is soft.  Skin:    General: Skin is warm and dry.  Neurological:     General: No  focal deficit present.     Mental Status: Alert and oriented to person, place and time.     Cranial Nerves: Cranial nerves are intact.         ASSESSMENT & PLAN:    1. Coronary artery disease of native artery of native heart with stable angina pectoris [facility]) S/p DES to the RCA in 12/21.  He recently underwent DES x2 to the LCx.  He has residual disease in a small to moderate diagonal, OM2 and small PDA.  OM1 is also occluded.  Residual disease is to be managed medically.  There are no good targets for PCI.  He is doing well without recurrent angina since his most recent PCI.  Continue current dose of aspirin, clopidogrel, atorvastatin, metoprolol succinate, isosorbide.  Adjust amlodipine as noted below due to low blood pressure.  He may resume cardiac rehabilitation.  Follow-up with Dr. Johney Frame in 6 months.  2. Essential hypertension Blood pressure running somewhat low.  He does have some lightheadedness from time to time.  Decrease amlodipine to 5 mg daily.  I have asked him to monitor his blood pressure after making the change in dose.  If he has pressures above target (130/80) he knows to contact me.  3. Mixed hyperlipidemia LDL optimal on most recent lab work.  Continue current Rx.    4. Paresthesias He notes some tingling in both arms when he turns his head certain positions.  He has had a history of neck surgery.  He does not have any carotid or supraclavicular bruits on exam.  His blood pressures are equal in both arms.  Therefore, I think his symptoms are neurogenic.  I have asked him  to f/u with his neurosurgeon/orthopedist to evaluate this.       Dispo:  Return in about 6 months (around 08/11/2021) for Routine Follow Up, w/ Dr. Johney Frame.   Medication Adjustments/Labs and Tests Ordered: Current medicines are reviewed at length with the patient today.  Concerns regarding medicines are outlined above.  Tests Ordered: Orders Placed This Encounter  Procedures   EKG 12-Lead    Medication Changes: Meds ordered this encounter  Medications   amLODipine (NORVASC) 10 MG tablet    Sig: Take 0.5 tablets (5 mg total) by mouth every morning.    Dispense:  45 tablet    Refill:  3    Signed, Richardson Dopp, PA-C  02/09/2021 12:14 PM    [facility] [address], [place], [place]  [address] Phone: [phone]; Fax: [phone]

## 2021-02-09 ENCOUNTER — Other Ambulatory Visit: Payer: Self-pay

## 2021-02-09 ENCOUNTER — Encounter: Payer: Self-pay | Admitting: Physician Assistant

## 2021-02-09 ENCOUNTER — Other Ambulatory Visit (HOSPITAL_COMMUNITY): Payer: Self-pay

## 2021-02-09 ENCOUNTER — Other Ambulatory Visit: Payer: Self-pay | Admitting: Family Medicine

## 2021-02-09 ENCOUNTER — Ambulatory Visit (INDEPENDENT_AMBULATORY_CARE_PROVIDER_SITE_OTHER): Payer: No Typology Code available for payment source | Admitting: Physician Assistant

## 2021-02-09 VITALS — BP 90/60 | HR 88 | Ht 69.0 in | Wt 239.0 lb

## 2021-02-09 DIAGNOSIS — I25110 Atherosclerotic heart disease of native coronary artery with unstable angina pectoris: Secondary | ICD-10-CM

## 2021-02-09 DIAGNOSIS — I10 Essential (primary) hypertension: Secondary | ICD-10-CM

## 2021-02-09 DIAGNOSIS — I25118 Atherosclerotic heart disease of native coronary artery with other forms of angina pectoris: Secondary | ICD-10-CM | POA: Diagnosis not present

## 2021-02-09 DIAGNOSIS — E782 Mixed hyperlipidemia: Secondary | ICD-10-CM

## 2021-02-09 DIAGNOSIS — R202 Paresthesia of skin: Secondary | ICD-10-CM | POA: Diagnosis not present

## 2021-02-09 MED ORDER — AMLODIPINE BESYLATE 10 MG PO TABS: 5.0000 mg | ORAL_TABLET | Freq: Every morning | ORAL | 3 refills | Status: DC

## 2021-02-09 MED ORDER — BUPROPION HCL ER (XL) 150 MG PO TB24
150.0000 mg | ORAL_TABLET | Freq: Every day | ORAL | 0 refills | Status: DC
  Filled 2021-02-09: qty 90, 90d supply, fill #0

## 2021-02-09 MED ORDER — BUPROPION HCL ER (XL) 300 MG PO TB24
300.0000 mg | ORAL_TABLET | Freq: Every day | ORAL | 0 refills | Status: DC
  Filled 2021-02-09: qty 90, 90d supply, fill #0

## 2021-02-09 NOTE — Patient Instructions (Signed)
Medication Instructions:  Your physician has recommended you make the following change in your medication:   DECREASE Amlodipine one half tablet by mouth ( 5 mg) daily.    *If you need a refill on your cardiac medications before your next appointment, please call your pharmacy*   Lab Work:  -NONE  If you have labs (blood work) drawn today and your tests are completely normal, you will receive your results only by: MyChart Message (if you have MyChart) OR A paper copy in the mail If you have any lab test that is abnormal or we need to change your treatment, we will call you to review the results.   Testing/Procedures:  -NONE    Follow-Up: At [facility], you and your health needs are our priority.  As part of our continuing mission to provide you with exceptional heart care, we have created designated Provider Care Teams.  These Care Teams include your primary Cardiologist (physician) and Advanced Practice Providers (APPs -  Physician Assistants and Nurse Practitioners) who all work together to provide you with the care you need, when you need it.  We recommend signing up for the patient portal called "MyChart".  Sign up information is provided on this After Visit Summary.  MyChart is used to connect with patients for Virtual Visits (Telemedicine).  Patients are able to view lab/test results, encounter notes, upcoming appointments, etc.  Non-urgent messages can be sent to your provider as well.   To learn more about what you can do with MyChart, go to [email or web address].    Your next appointment:   6 month(s) with Dr. Shari Prows on Tuesday, November 22@ 11:00 am.   The format for your next appointment:   In Person  Provider:   Laurance Flatten, MD   Other Instructions  Please keep a check on your blood pressures if your bp consistently stays above 130/80 X 3 in a row send a mychart message or call [phone].

## 2021-02-12 ENCOUNTER — Encounter (HOSPITAL_COMMUNITY): Payer: No Typology Code available for payment source

## 2021-02-13 ENCOUNTER — Telehealth: Payer: Self-pay | Admitting: *Deleted

## 2021-02-13 ENCOUNTER — Encounter (HOSPITAL_COMMUNITY): Payer: Self-pay | Admitting: *Deleted

## 2021-02-13 DIAGNOSIS — G4733 Obstructive sleep apnea (adult) (pediatric): Secondary | ICD-10-CM

## 2021-02-13 DIAGNOSIS — Z955 Presence of coronary angioplasty implant and graft: Secondary | ICD-10-CM

## 2021-02-13 NOTE — Addendum Note (Signed)
Addended by: Reesa Chew on: 02/13/2021 11:42 AM   Modules accepted: Orders

## 2021-02-13 NOTE — Telephone Encounter (Signed)
-----   Message from Quintella Reichert, MD sent at 02/05/2021 11:03 AM EDT ----- Please let patient know that he has severe OSA but unsuccessful CPAP titration due to severity of OSA.  Please set up in lab BiPAP titration

## 2021-02-13 NOTE — Telephone Encounter (Signed)
The patient has been notified of the result and verbalized understanding.  All questions (if any) were answered. [name], CMA 02/13/2021 10:54 AM  In lab Bipap titration sent to sleep pool

## 2021-02-14 ENCOUNTER — Encounter (HOSPITAL_COMMUNITY)
Admission: RE | Admit: 2021-02-14 | Discharge: 2021-02-14 | Disposition: A | Discharge status: Home / Self Care | Payer: No Typology Code available for payment source | Source: Ambulatory Visit | Attending: Cardiology | Admitting: Cardiology

## 2021-02-14 ENCOUNTER — Other Ambulatory Visit (HOSPITAL_COMMUNITY): Payer: Self-pay

## 2021-02-14 ENCOUNTER — Other Ambulatory Visit: Payer: Self-pay

## 2021-02-14 DIAGNOSIS — Z955 Presence of coronary angioplasty implant and graft: Secondary | ICD-10-CM | POA: Diagnosis present

## 2021-02-14 NOTE — Progress Notes (Signed)
Cardiac Individual Treatment Plan  Patient Details  Name: Brandon [name] MRN: [id number] Date of Birth: 05/28/1956 Referring Provider:   [facility] CARDI[facility] REHAB PHASE II ORIENTATION from 11/07/2020 in [place]  Referring Provider Freada Bergeron, MD       Initial Encounter Date:  Brandon [name] PHASE II ORIENTATION from 11/07/2020 in [place]  Date 11/07/20       Visit Diagnosis: 08/15/20 S/P DES RCA  01/17/21 S/P DES Prox Circ, DEs Mid Circ  Patient's Home Medications on Admission:  Current Outpatient Medications:    amLODipine (NORVASC) 10 MG tablet, Take 0.5 tablets (5 mg total) by mouth every morning., Disp: 45 tablet, Rfl: 3   ARIPiprazole (ABILIFY) 10 MG tablet, Take 10 mg by mouth every morning., Disp: , Rfl:    aspirin 81 MG EC tablet, Take 81 mg by mouth every morning., Disp: , Rfl:    atorvastatin (LIPITOR) 80 MG tablet, TAKE 1 TABLET (80 MG TOTAL) BY MOUTH DAILY., Disp: 90 tablet, Rfl: 3   Blood Glucose Monitoring Suppl (FREESTYLE LITE) w/Device KIT, USE AS DIRECTED., Disp: 1 kit, Rfl: 99   Blood Pressure Monitoring (OMRON 3 SERIES BP MONITOR) DEVI, USE AS DIRECTED., Disp: 1 each, Rfl: 0   buPROPion (WELLBUTRIN XL) 150 MG 24 hr tablet, Take 1 tablet (150 mg total) by mouth daily., Disp: 90 tablet, Rfl: 0   buPROPion (WELLBUTRIN XL) 300 MG 24 hr tablet, Take 1 tablet (300 mg total) by mouth daily., Disp: 90 tablet, Rfl: 0   clopidogrel (PLAVIX) 75 MG tablet, TAKE 1 TABLET (75 MG TOTAL) BY MOUTH DAILY WITH BREAKFAST., Disp: 90 tablet, Rfl: 3   Continuous Blood Gluc Sensor (DEXCOM G6 SENSOR) MISC, Use as directed. Change sensor every 10 days, Disp: 9 each, Rfl: 3   empagliflozin (JARDIANCE) 25 MG TABS tablet, Take 1 tablet (25 mg total) by mouth daily., Disp: 90 tablet, Rfl: 0   glucose blood test strip, USE 1 TO CHECK BLOOD GLUCOSE UP TO 5 TIMES DAILY. (Patient taking differently: USE 1  TO CHECK BLOOD GLUCOSE UP TO 5 TIMES DAILY.), Disp: 300 strip, Rfl: 12   insulin degludec (TRESIBA FLEXTOUCH) 100 UNIT/ML FlexTouch Pen, Inject 35 Units into the skin daily., Disp: 45 mL, Rfl: 3   insulin lispro (HUMALOG) 100 UNIT/ML KwikPen, Inject 10-24 Units into the skin 3 (three) times daily before meals. (Patient taking differently: Inject 24 Units into the skin 3 (three) times daily before meals.), Disp: 15 mL, Rfl: 11   isosorbide mononitrate (IMDUR) 30 MG 24 hr tablet, Take 1 tablet (30 mg total) by mouth daily., Disp: 90 tablet, Rfl: 1   Lancets (FREESTYLE) lancets, USE 1 TO CHECK BLOOD GLUCOSE UP TO 5 TIMES DAILY. (Patient taking differently: USE 1 TO CHECK BLOOD GLUCOSE UP TO 5 TIMES DAILY.), Disp: 300 each, Rfl: 12   losartan (COZAAR) 25 MG tablet, Take 1 tablet (25 mg total) by mouth daily. Take only when systolic BP greater than 856., Disp: 90 tablet, Rfl: 1   metoCLOPramide (REGLAN) 10 MG tablet, Take 10 mg by mouth daily as needed for nausea or vomiting (upset stomach)., Disp: , Rfl:    metoprolol succinate (TOPROL-XL) 50 MG 24 hr tablet, Take 1 tablet (50 mg total) by mouth daily. Take with or immediately following a meal., Disp: 90 tablet, Rfl: 1   nitroGLYCERIN (NITROSTAT) 0.4 MG SL tablet, Place 1 tablet (0.4 mg total) under the tongue every 5 (  five) minutes as needed for chest pain., Disp: 60 tablet, Rfl: 0   ondansetron (ZOFRAN ODT) 4 MG disintegrating tablet, Take 1 tablet (4 mg total) by mouth every 8 (eight) hours as needed for nausea or vomiting., Disp: 20 tablet, Rfl: 0   pantoprazole (PROTONIX) 40 MG tablet, TAKE 1 TABLET BY MOUTH ONCE DAILY, Disp: 90 tablet, Rfl: 0   Semaglutide,0.25 or 0.5MG/DOS, (OZEMPIC, 0.25 OR 0.5 MG/DOSE,) 2 MG/1.5ML SOPN, Inject 0.5 mg into the skin once a week., Disp: 4.5 mL, Rfl: 3   tamsulosin (FLOMAX) 0.4 MG CAPS capsule, TAKE 1 CAPSULE BY MOUTH ONCE DAILY., Disp: 90 capsule, Rfl: 0   testosterone cypionate (DEPOTESTOSTERONE CYPIONATE) 200 MG/ML  injection, Inject 1 mL (200 mg total) into the muscle every Sunday., Disp: 5 mL, Rfl: 1   tiZANidine (ZANAFLEX) 4 MG tablet, Take 2 tablets (8 mg total) by mouth 3 (three) times daily., Disp: 180 tablet, Rfl: 11   traMADol (ULTRAM) 50 MG tablet, Take 2 tablets (100 mg total) by mouth 3 (three) times daily as needed., Disp: 180 tablet, Rfl: 0   triazolam (HALCION) 0.25 MG tablet, TAKE 1-2 TABS BY MOUTH 2 HOURS BEFORE PROCEDURE OR IMAGING.DO NOT DRIVE WITH THIS MEDICATION., Disp: 2 tablet, Rfl: 0  Past Medical History: Past Medical History:  Diagnosis Date   Anxiety    Basal cell carcinoma (BCC) in situ of skin    Colon polyps    Coronary artery disease    S/p DES to mRCA in 12/21 // S/p DES to LCx x 2 in 12/2020 // Diff dz in small to mod Dx (not a good target for PCI); OM1 100 CTO; severe dz in small OM2 and PDA >> Med Rx   Depression    Diabetes mellitus without complication (HCC)    HLD (hyperlipidemia)    Hypertension    SBO (small bowel obstruction) (HCC)     Tobacco Use: Social History   Tobacco Use  Smoking Status Former   Pack years: 0.00   Types: Cigarettes   Quit date: 2007   Years since quitting: 15.4  Smokeless Tobacco Never    Labs: Recent Review Scientist, physiological     Labs for ITP Cardiac and Pulmonary Rehab Latest Ref Rng & Units 04/07/2020 08/13/2020 10/23/2020 11/15/2020 01/16/2021   Cholestrol 100 - 199 mg/dL - 160 113 - -   LDLCALC 0 - 99 mg/dL - 76 53 - -   HDL >39 mg/dL - 32(L) 45 - -   Trlycerides 0 - 149 mg/dL - 258(H) 74 - -   Hemoglobin A1c 4.0 - 5.6 % 7.2(H) 10.1(H) - 6.5(A) 6.8(A)       Capillary Blood Glucose: Lab Results  Component Value Date   GLUCAP 176 (H) 01/17/2021   GLUCAP 121 (H) 01/17/2021   GLUCAP 84 01/17/2021   GLUCAP 122 (H) 01/17/2021   GLUCAP 171 (H) 11/15/2020     Exercise Target Goals: Exercise Program Goal: Individual exercise prescription set using results from initial 6 min walk test and THRR while considering  patient's  activity barriers and safety.   Exercise Prescription Goal: Initial exercise prescription builds to 30-45 minutes a day of aerobic activity, 2-3 days per week.  Home exercise guidelines will be given to patient during program as part of exercise prescription that the participant will acknowledge.  Activity Barriers & Risk Stratification:  Activity Barriers & Cardiac Risk Stratification - 11/07/20 1044       Activity Barriers & Cardiac Risk Stratification   Activity  Barriers Arthritis;Back Problems;Joint Problems;Fibromyalgia;Other (comment)    Comments Back surgery in 2006 with ongoing back pain. L3-L5 fused, S1 joints arthritic and degenerative. ROM in back decreased by 40%. Has spinal cord stimulator. Hip pain with prolonged walking.    Cardiac Risk Stratification Moderate             6 Minute Walk:  6 Minute Walk     Row Name 11/07/20 1054         6 Minute Walk   Phase Initial     Distance 1064 feet     Walk Time 6 minutes     # of Rest Breaks 0     MPH 2.01     METS 2.24     RPE 13     Perceived Dyspnea  1     VO2 Peak 7.84     Symptoms Yes (comment)     Comments Patient reported 6/10 left hip pain and mild SOB, 1/4 on dyspnea scale.     Resting HR 79 bpm     Resting BP 102/60     Resting Oxygen Saturation  98 %     Exercise Oxygen Saturation  during 6 min walk 98 %     Max Ex. HR 86 bpm     Max Ex. BP 134/72     2 Minute Post BP 118/70              Oxygen Initial Assessment:   Oxygen Re-Evaluation:   Oxygen Discharge (Final Oxygen Re-Evaluation):   Initial Exercise Prescription:  Initial Exercise Prescription - 11/07/20 1100       Date of Initial Exercise RX and Referring Provider   Date 11/07/20    Referring Provider Freada Bergeron, MD    Expected Discharge Date 01/05/21      T5 Nustep   Level 2    SPM 85    Minutes 25    METs 1.8      Prescription Details   Frequency (times per week) 3    Duration Progress to 30 minutes of  continuous aerobic without signs/symptoms of physical distress      Intensity   THRR 40-80% of Max Heartrate 62-124    Ratings of Perceived Exertion 11-13    Perceived Dyspnea 0-4      Progression   Progression Continue to progress workloads to maintain intensity without signs/symptoms of physical distress.      Resistance Training   Training Prescription Yes    Weight 4 lbs    Reps 10-15             Perform Capillary Blood Glucose checks as needed.  Exercise Prescription Changes:   Exercise Prescription Changes     Row Name 11/13/20 1048 11/20/20 1044 12/08/20 1045 12/18/20 1050 01/01/21 1049     Response to Exercise   Blood Pressure (Admit) 120/60 150/76 122/76 142/64 148/60   Blood Pressure (Exercise) 122/62 128/64 118/58 128/62 166/62   Blood Pressure (Exit) 118/68 118/70 104/58 100/60 148/62   Heart Rate (Admit) 89 bpm 92 bpm 97 bpm 90 bpm 87 bpm   Heart Rate (Exercise) 89 bpm 92 bpm 94 bpm 93 bpm 93 bpm   Heart Rate (Exit) 81 bpm 85 bpm 87 bpm 86 bpm 87 bpm   Rating of Perceived Exertion (Exercise) _0 Symptoms Patient c/o SI joint pain, which is chronic for him. -- -- -- --   Comments Patient completed 1st session of  exercise fair with some joint pain. -- -- -- --   Duration Progress to 30 minutes of  aerobic without signs/symptoms of physical distress Progress to 30 minutes of  aerobic without signs/symptoms of physical distress Progress to 30 minutes of  aerobic without signs/symptoms of physical distress Progress to 30 minutes of  aerobic without signs/symptoms of physical distress Progress to 30 minutes of  aerobic without signs/symptoms of physical distress   Intensity _0      Progression   Progression Continue to progress workloads to maintain intensity without signs/symptoms of physical distress. Continue to progress workloads to maintain intensity without signs/symptoms of  physical distress. Continue to progress workloads to maintain intensity without signs/symptoms of physical distress. Continue to progress workloads to maintain intensity without signs/symptoms of physical distress. Continue to progress workloads to maintain intensity without signs/symptoms of physical distress.   Average METs 1.5 1.7 1.5 1.7 1.9     Resistance Training   Training Prescription _1    Weight 4 lbs 4 lbs 4 lbs 4 lbs 4 lbs   Reps 10-15 10-15 10-15 10-15 10-15   Time 10 Minutes 10 Minutes 10 Minutes 10 Minutes 10 Minutes     Interval Training   Interval Training _2      NuStep   Level _3 SPM 85 85 85 85 85   Minutes _4 METs 1.5 1.7 1.5 1.7 1.9     T5 Nustep   Level -- -- -- -- --   SPM -- -- -- -- --   Minutes -- -- -- -- --   METs -- -- -- -- --     Home Exercise Plan   Plans to continue exercise at -- -- Home (comment)  Walking Home (comment)  Walking Home (comment)  Walking   Frequency -- -- Add 4 additional days to program exercise sessions. Add 4 additional days to program exercise sessions. Add 4 additional days to program exercise sessions.   Initial Home Exercises Provided -- -- 12/08/20 12/08/20 12/08/20    Row Name 02/14/21 1051             Response to Exercise   Blood Pressure (Admit) 130/66       Blood Pressure (Exercise) 108/72       Blood Pressure (Exit) 118/72       Heart Rate (Admit) 92 bpm       Heart Rate (Exercise) 96 bpm       Heart Rate (Exit) 88 bpm       Rating of Perceived Exertion (Exercise) 12       Duration Progress to 30 minutes of  aerobic without signs/symptoms of physical distress       Intensity THRR unchanged               Progression     Progression Continue to progress workloads to maintain intensity without signs/symptoms of physical distress.       Average METs 2               Resistance Training     Training Prescription No               Interval Training      Interval Training No               NuStep     Level  5       SPM 85       Minutes 25       METs 2               Home Exercise Plan     Plans to continue exercise at Home (comment)  Walking       Frequency Add 4 additional days to program exercise sessions.       Initial Home Exercises Provided 12/08/20               Exercise Comments:   Exercise Comments     Row Name 11/13/20 1141 12/08/20 1049 12/18/20 1139 01/16/21 0754 02/14/21 1133   Exercise Comments Patient completed 1st session of exercise fair with some joint pain. Reviewed home exercise guideline, METs, and goals with patient. Reviewed METs and goals with patient. Patient out since 01/01/21 awaiting cardiac cath scheduled for 01/17/21. Reviewed goals with patient.            Exercise Goals and Review:   Exercise Goals     Row Name 11/07/20 1045             Exercise Goals   Increase Physical Activity Yes       Intervention Provide advice, education, support and counseling about physical activity/exercise needs.;Develop an individualized exercise prescription for aerobic and resistive training based on initial evaluation findings, risk stratification, comorbidities and participant's personal goals.       Expected Outcomes Short Term: Attend rehab on a regular basis to increase amount of physical activity.;Long Term: Exercising regularly at least 3-5 days a week.;Long Term: Add in home exercise to make exercise part of routine and to increase amount of physical activity.       Increase Strength and Stamina Yes       Intervention Provide advice, education, support and counseling about physical activity/exercise needs.;Develop an individualized exercise prescription for aerobic and resistive training based on initial evaluation findings, risk stratification, comorbidities and participant's personal goals.       Expected Outcomes Short Term: Increase workloads from initial exercise prescription for resistance, speed,  and METs.;Short Term: Perform resistance training exercises routinely during rehab and add in resistance training at home;Long Term: Improve cardiorespiratory fitness, muscular endurance and strength as measured by increased METs and functional capacity (6MWT)       Able to understand and use rate of perceived exertion (RPE) scale Yes       Intervention Provide education and explanation on how to use RPE scale       Expected Outcomes Short Term: Able to use RPE daily in rehab to express subjective intensity level;Long Term:  Able to use RPE to guide intensity level when exercising independently       Knowledge and understanding of Target Heart Rate Range (THRR) Yes       Intervention Provide education and explanation of THRR including how the numbers were predicted and where they are located for reference       Expected Outcomes Short Term: Able to state/look up THRR;Long Term: Able to use THRR to govern intensity when exercising independently;Short Term: Able to use daily as guideline for intensity in rehab       Able to check pulse independently Yes       Intervention Provide education and demonstration on how to check pulse in carotid and radial arteries.;Review the importance of being able to check your own pulse for safety during independent exercise  Expected Outcomes Short Term: Able to explain why pulse checking is important during independent exercise;Long Term: Able to check pulse independently and accurately       Understanding of Exercise Prescription Yes       Intervention Provide education, explanation, and written materials on patient's individual exercise prescription       Expected Outcomes Short Term: Able to explain program exercise prescription;Long Term: Able to explain home exercise prescription to exercise independently                Exercise Goals Re-Evaluation :  Exercise Goals Re-Evaluation     Row Name 11/13/20 1141 12/08/20 1049 12/18/20 1139 01/16/21 0754  02/14/21 1133     Exercise Goal Re-Evaluation   Exercise Goals Review Able to understand and use rate of perceived exertion (RPE) scale;Increase Physical Activity Able to understand and use rate of perceived exertion (RPE) scale;Increase Physical Activity;Understanding of Exercise Prescription;Increase Strength and Stamina;Knowledge and understanding of Target Heart Rate Range (THRR);Able to check pulse independently Able to understand and use rate of perceived exertion (RPE) scale;Increase Physical Activity;Understanding of Exercise Prescription;Increase Strength and Stamina;Knowledge and understanding of Target Heart Rate Range (THRR);Able to check pulse independently -- Able to understand and use rate of perceived exertion (RPE) scale;Increase Physical Activity;Understanding of Exercise Prescription;Increase Strength and Stamina;Knowledge and understanding of Target Heart Rate Range (THRR);Able to check pulse independently   Comments Patient able to understand and use RPE scale appropriately. Patient plans to walk 30 minutes 4-7 days/week as his mode of home exercise. Patient is able to manually count his pulse. Patient is walking about 600 yards in 10 minutes, 2-3 days/week. Discussed increasing walking duration, and patient states he can only walk about 6 minutes continuous because of bilateral hip pain. Discussed walking 10 minutes, 3 times/day taking rest breaks as needed at least 2 days/week, and patient is amenable to this. Patient has been out since 01/01/21 awaiting cardiac catheterization. Unable to review goals at this time. Patient returned to exercise today after [name] placement on 01/17/21. Patient states he feels much better since [name] intervention. Patient states he has no SOB and is able to carry groceries now without any problems. Patient is water walking, 2 days/week and walking outdoors to and from the mailbox in 30 minutes. Patient is working with PT on Thursdays and Fridays, 1 day  stretching in pool, 1 day stretching on land. Patient plans to go to [employer] 2 days/week as well.   Expected Outcomes Progress workloads as tolerated to help achieve personal health and fitness goals. Patient will walk 30 minutes, 4 days/ week in addition to exercise at cardiac rehab to help improve cardiorespiratory fitness. Patient will accumulate 30 minutes of walking at least 2 days/week in addition to exercise at cardiac rehab. Will review goals upon return to cardiac rehab. Patient will continue current exercise routine to help increase strength and stamina.            Discharge Exercise Prescription (Final Exercise Prescription Changes):  Exercise Prescription Changes - 02/14/21 1051       Response to Exercise   Blood Pressure (Admit) 130/66    Blood Pressure (Exercise) 108/72    Blood Pressure (Exit) 118/72    Heart Rate (Admit) 92 bpm    Heart Rate (Exercise) 96 bpm    Heart Rate (Exit) 88 bpm    Rating of Perceived Exertion (Exercise) 12    Duration Progress to 30 minutes of  aerobic without signs/symptoms of physical distress  Intensity THRR unchanged      Progression   Progression Continue to progress workloads to maintain intensity without signs/symptoms of physical distress.    Average METs 2      Resistance Training   Training Prescription No      Interval Training   Interval Training No      NuStep   Level 5    SPM 85    Minutes 25    METs 2      Home Exercise Plan   Plans to continue exercise at Home (comment)   Walking   Frequency Add 4 additional days to program exercise sessions.    Initial Home Exercises Provided 12/08/20             Nutrition:  Target Goals: Understanding of nutrition guidelines, daily intake of sodium <1557m, cholesterol <2021m calories 30% from fat and 7% or less from saturated fats, daily to have 5 or more servings of fruits and vegetables.  Biometrics:  Pre Biometrics - 11/07/20 1050       Pre Biometrics    Waist Circumference 46.5 inches    Hip Circumference 43.25 inches    Waist to Hip Ratio 1.08 %    Triceps Skinfold 10 mm    % Body Fat 31.2 %    Grip Strength 44.5 kg    Flexibility --   N/A due to back problems.   Single Leg Stand 5.93 seconds              Nutrition Therapy Plan and Nutrition Goals:  Nutrition Therapy & Goals - 11/24/20 1207       Nutrition Therapy   Diet TLC; carb modified    Drug/Food Interactions Statins/Certain Fruits      Personal Nutrition Goals   Nutrition Goal Pt to identify food quantities necessary to achieve weight loss of 6-24 lb at graduation from cardiac rehab.    Personal Goal #2 Pt to build a healthy plate including vegetables, fruits, whole grains, and low-fat dairy products in a heart healthy meal plan.    Personal Goal #3 Pt to continue checking CBGs and keep WNL      Intervention Plan   Intervention Prescribe, educate and counsel regarding individualized specific dietary modifications aiming towards targeted core components such as weight, hypertension, lipid management, diabetes, heart failure and other comorbidities.;Nutrition handout(s) given to patient.    Expected Outcomes Short Term Goal: A plan has been developed with personal nutrition goals set during dietitian appointment.;Long Term Goal: Adherence to prescribed nutrition plan.             Nutrition Assessments:  MEDIFICTS Score Key: ?70 Need to make dietary changes  40-70 Heart Healthy Diet ? 40 Therapeutic Level Cholesterol Diet   Flowsheet Row CARDIAC REHAB PHASE II [facility] Total [facility] on [facility] 42      Picture Your Plate Scores: <4<53nhealthy dietary pattern with much room for improvement. 41-50 Dietary pattern unlikely to meet recommendations for good health and room for improvement. 51-60 More healthful dietary pattern, with some room for improvement.  >60 Healthy dietary  pattern, although there may be some specific behaviors that could be improved.    Nutrition Goals Re-Evaluation:  Nutrition Goals Re-Evaluation     [place] 11/24/20 1208 12/15/20 1329 01/16/21 0719         Goals   Current Weight 243 lb (110.2 kg) 241 lb 6.5 oz (109.5 kg)  241 lb 2.9 oz (109.4 kg)     Nutrition Goal Pt to identify food quantities necessary to achieve weight loss of 6-24 lb at graduation from cardiac rehab. Pt to identify food quantities necessary to achieve weight loss of 6-24 lb at graduation from cardiac rehab. Pt to identify food quantities necessary to achieve weight loss of 6-24 lb at graduation from cardiac rehab.     Expected Outcome Weight loss Weight loss Weight loss           Personal Goal #2 Re-Evaluation       Personal Goal #2 Pt to build a healthy plate including vegetables, fruits, whole grains, and low-fat dairy products in a heart healthy meal plan. Pt to build a healthy plate including vegetables, fruits, whole grains, and low-fat dairy products in a heart healthy meal plan. Pt to build a healthy plate including vegetables, fruits, whole grains, and low-fat dairy products in a heart healthy meal plan.           Personal Goal #3 Re-Evaluation       Personal Goal #3 Pt to continue checking CBGs and keep WNL Pt to continue checking CBGs and keep WNL Pt to continue checking CBGs and keep WNL             Nutrition Goals Re-Evaluation:  Nutrition Goals Re-Evaluation     [place] Name 11/24/20 1208 12/15/20 1329 01/16/21 0719         Goals   Current Weight 243 lb (110.2 kg) 241 lb 6.5 oz (109.5 kg) 241 lb 2.9 oz (109.4 kg)     Nutrition Goal Pt to identify food quantities necessary to achieve weight loss of 6-24 lb at graduation from cardiac rehab. Pt to identify food quantities necessary to achieve weight loss of 6-24 lb at graduation from cardiac rehab. Pt to identify food quantities necessary to achieve weight loss of 6-24 lb at graduation from cardiac  rehab.     Expected Outcome Weight loss Weight loss Weight loss           Personal Goal #2 Re-Evaluation       Personal Goal #2 Pt to build a healthy plate including vegetables, fruits, whole grains, and low-fat dairy products in a heart healthy meal plan. Pt to build a healthy plate including vegetables, fruits, whole grains, and low-fat dairy products in a heart healthy meal plan. Pt to build a healthy plate including vegetables, fruits, whole grains, and low-fat dairy products in a heart healthy meal plan.           Personal Goal #3 Re-Evaluation       Personal Goal #3 Pt to continue checking CBGs and keep WNL Pt to continue checking CBGs and keep WNL Pt to continue checking CBGs and keep WNL             Nutrition Goals Discharge (Final Nutrition Goals Re-Evaluation):  Nutrition Goals Re-Evaluation - 01/16/21 0719       Goals   Current Weight 241 lb 2.9 oz (109.4 kg)    Nutrition Goal Pt to identify food quantities necessary to achieve weight loss of 6-24 lb at graduation from cardiac rehab.    Expected Outcome Weight loss      Personal Goal #2 Re-Evaluation   Personal Goal #2 Pt to build a healthy plate including vegetables, fruits, whole grains, and low-fat dairy products in a heart healthy meal plan.      Personal Goal #3 Re-Evaluation   Personal Goal #3 Pt  to continue checking CBGs and keep WNL             Psychosocial: Target Goals: Acknowledge presence or absence of significant depression and/or stress, maximize coping skills, provide positive support system. Participant is able to verbalize types and ability to use techniques and skills needed for reducing stress and depression.  Initial Review & Psychosocial Screening:  Initial Psych Review & Screening - 11/07/20 1201       Initial Review   Current issues with History of Depression;Current Anxiety/Panic;Current Stress Concerns    Source of Stress Concerns Chronic Illness;Unable to perform yard/household  activities    Comments Brandon [name] has been disabled due to breaking his back in 2006 continues to have problems with back pain. History of depression currently controlled on two antidepressants.      Family Dynamics   Good Support System? Yes   Brandon [name] has his wife and 4 children for suport     Barriers   Psychosocial barriers to participate in program The patient should benefit from training in stress management and relaxation.      Screening Interventions   Interventions Encouraged to exercise;To provide support and resources with identified psychosocial needs;Provide feedback about the scores to participant    Expected Outcomes Long Term Goal: Stressors or current issues are controlled or eliminated.;Short Term goal: Identification and review with participant of any Quality of Life or Depression concerns found by scoring the questionnaire.             Quality of Life Scores:  Quality of Life - 11/07/20 1141       Quality of Life   Select Quality of Life      Quality of Life Scores   Health/Function Pre 17.9 %    Socioeconomic Pre 20.33 %    Psych/Spiritual Pre 21.93 %    Family Pre 25 %    GLOBAL Pre 20.27 %            Scores of 19 and below usually indicate a poorer quality of life in these areas.  A difference of  2-3 points is a clinically meaningful difference.  A difference of 2-3 points in the total score of the Quality of Life Index has been associated with significant improvement in overall quality of life, self-image, physical symptoms, and general health in studies assessing change in quality of life.  PHQ-9: Recent Review Flowsheet Data     Depression screen [facility] 2/9 11/07/2020 06/02/2020   Decreased Interest 0 1   Down, Depressed, Hopeless 0 1   PHQ - 2 Score 0 2   Altered sleeping - 2   Tired, decreased energy - 2   Change in appetite - 2   Feeling bad or failure about yourself  - 0   Trouble concentrating - 0   Moving slowly or fidgety/restless - 0    Suicidal thoughts - 0   PHQ-9 Score - 8   Difficult doing work/chores - Somewhat difficult      Interpretation of Total Score  Total Score Depression Severity:  1-4 = Minimal depression, 5-9 = Mild depression, 10-14 = Moderate depression, 15-19 = Moderately severe depression, 20-27 = Severe depression   Psychosocial Evaluation and Intervention:   Psychosocial Re-Evaluation:  Psychosocial Re-Evaluation     Fajardo Name 11/20/20 1631 12/18/20 1416 01/16/21 1135 02/13/21 1513       Psychosocial Re-Evaluation   Current issues with History of Depression;Current Stress Concerns;Current Anxiety/Panic History of Depression;Current Stress Concerns;Current Anxiety/Panic History of Depression;Current  Stress Concerns;Current Anxiety/Panic History of Depression;Current Stress Concerns;Current Anxiety/Panic    Comments Brandon [name] denies having increased depression and anxiety. Quality of life questionnaire forwarded to Dr Zigmund Daniel. Brandon [name]'s depression is currently controlled on 2 antidepressants Brandon [name] denies having increased depression and anxiety. Brandon [name]'s depression is currently controlled on 2 antidepressants. Brandon [name] mentioned having some stress as adult age children are staying with him after they recenlty moved into a larger apartment. Brandon [name] denies having increased depression and anxiety. Brandon [name]'s depression is currently controlled on 2 antidepressants. Brandon [name]'s last date of exercise was on 01/01/21 unable to assess at this time Brandon [name] exercise has been on hold. Brandon [name]    Expected Outcomes Sotirios will have less anxiety and depression upon completion of phase 2 cardiac rehab. Ebbie will have less anxiety and depression upon completion of phase 2 cardiac rehab. Windel will have less anxiety and depression upon completion of phase 2 cardiac rehab. Piers will have less anxiety and depression upon completion of phase 2 cardiac rehab.    Interventions Relaxation education;Encouraged to attend Cardiac  Rehabilitation for the exercise;Stress management education Relaxation education;Encouraged to attend Cardiac Rehabilitation for the exercise;Stress management education Relaxation education;Encouraged to attend Cardiac Rehabilitation for the exercise;Stress management education Relaxation education;Encouraged to attend Cardiac Rehabilitation for the exercise;Stress management education    Continue Psychosocial Services  Follow up required by staff Follow up required by staff Follow up required by staff Follow up required by staff         Initial Review        Source of Stress Concerns Chronic Illness;Unable to perform yard/household activities;Retirement/disability Chronic Illness;Unable to perform yard/household activities;Retirement/disability Chronic Illness;Unable to perform yard/household activities;Retirement/disability Chronic Illness;Unable to perform yard/household activities;Retirement/disability    Comments Will continue to monitor and offer suport as needed Will continue to monitor and offer suport as needed Will continue to monitor and offer support as needed Will continue to monitor and offer support as needed            Psychosocial Discharge (Final Psychosocial Re-Evaluation):  Psychosocial Re-Evaluation - 02/13/21 1513       Psychosocial Re-Evaluation   Current issues with History of Depression;Current Stress Concerns;Current Anxiety/Panic    Comments Joushua exercise has been on hold. Brandon [name]    Expected Outcomes Cabe will have less anxiety and depression upon completion of phase 2 cardiac rehab.    Interventions Relaxation education;Encouraged to attend Cardiac Rehabilitation for the exercise;Stress management education    Continue Psychosocial Services  Follow up required by staff      Initial Review   Source of Stress Concerns Chronic Illness;Unable to perform yard/household activities;Retirement/disability    Comments Will continue to monitor and offer  support as needed             Vocational Rehabilitation: Provide vocational rehab assistance to qualifying candidates.   Vocational Rehab Evaluation & Intervention:  Vocational Rehab - 11/07/20 1205       Initial Vocational Rehab Evaluation & Intervention   Assessment shows need for Vocational Rehabilitation No   Brandon [name] is disabled and does not need vocational rehab at this time            Education: Education Goals: Education classes will be provided on a weekly basis, covering required topics. Participant will state understanding/return demonstration of topics presented.  Learning Barriers/Preferences:  Learning Barriers/Preferences - 11/07/20 1204       Learning Barriers/Preferences   Learning Barriers Exercise Concerns   Dizziness when standing too quick  Learning Preferences Skilled Demonstration             Education Topics: Count Your Pulse:  -Group instruction provided by verbal instruction, demonstration, patient participation and written materials to support subject.  Instructors address importance of being able to find your pulse and how to count your pulse when at home without a heart monitor.  Patients get hands on experience counting their pulse with staff help and individually.   Heart Attack, Angina, and Risk Factor Modification:  -Group instruction provided by verbal instruction, video, and written materials to support subject.  Instructors address signs and symptoms of angina and heart attacks.    Also discuss risk factors for heart disease and how to make changes to improve heart health risk factors.   Functional Fitness:  -Group instruction provided by verbal instruction, demonstration, patient participation, and written materials to support subject.  Instructors address safety measures for doing things around the house.  Discuss how to get up and down off the floor, how to pick things up properly, how to safely get out of a chair without  assistance, and balance training.   Meditation and Mindfulness:  -Group instruction provided by verbal instruction, patient participation, and written materials to support subject.  Instructor addresses importance of mindfulness and meditation practice to help reduce stress and improve awareness.  Instructor also leads participants through a meditation exercise.    Stretching for Flexibility and Mobility:  -Group instruction provided by verbal instruction, patient participation, and written materials to support subject.  Instructors lead participants through series of stretches that are designed to increase flexibility thus improving mobility.  These stretches are additional exercise for major muscle groups that are typically performed during regular warm up and cool down.   Hands Only CPR:  -Group verbal, video, and participation provides a basic overview of AHA guidelines for community CPR. Role-play of emergencies allow participants the opportunity to practice calling for help and chest compression technique with discussion of AED use.   Hypertension: -Group verbal and written instruction that provides a basic overview of hypertension including the most recent diagnostic guidelines, risk factor reduction with self-care instructions and medication management.    Nutrition I class: Heart Healthy Eating:  -Group instruction provided by PowerPoint slides, verbal discussion, and written materials to support subject matter. The instructor gives an explanation and review of the Therapeutic Lifestyle Changes diet recommendations, which includes a discussion on lipid goals, dietary fat, sodium, fiber, plant stanol/sterol esters, sugar, and the components of a well-balanced, healthy diet.   Nutrition II class: Lifestyle Skills:  -Group instruction provided by PowerPoint slides, verbal discussion, and written materials to support subject matter. The instructor gives an explanation and review of  label reading, grocery shopping for heart health, heart healthy recipe modifications, and ways to make healthier choices when eating out.   Diabetes Question & Answer:  -Group instruction provided by PowerPoint slides, verbal discussion, and written materials to support subject matter. The instructor gives an explanation and review of diabetes co-morbidities, pre- and post-prandial blood glucose goals, pre-exercise blood glucose goals, signs, symptoms, and treatment of hypoglycemia and hyperglycemia, and foot care basics.   Diabetes Blitz:  -Group instruction provided by PowerPoint slides, verbal discussion, and written materials to support subject matter. The instructor gives an explanation and review of the physiology behind type 1 and type 2 diabetes, diabetes medications and rational behind using different medications, pre- and post-prandial blood glucose recommendations and Hemoglobin A1c goals, diabetes diet, and exercise including blood glucose  guidelines for exercising safely.    Portion Distortion:  -Group instruction provided by PowerPoint slides, verbal discussion, written materials, and food models to support subject matter. The instructor gives an explanation of serving size versus portion size, changes in portions sizes over the last 20 years, and what consists of a serving from each food group.   Stress Management:  -Group instruction provided by verbal instruction, video, and written materials to support subject matter.  Instructors review role of stress in heart disease and how to cope with stress positively.     Exercising on Your Own:  -Group instruction provided by verbal instruction, power point, and written materials to support subject.  Instructors discuss benefits of exercise, components of exercise, frequency and intensity of exercise, and end points for exercise.  Also discuss use of nitroglycerin and activating EMS.  Review options of places to exercise outside of  rehab.  Review guidelines for sex with heart disease.   Cardiac Drugs I:  -Group instruction provided by verbal instruction and written materials to support subject.  Instructor reviews cardiac drug classes: antiplatelets, anticoagulants, beta blockers, and statins.  Instructor discusses reasons, side effects, and lifestyle considerations for each drug class.   Cardiac Drugs II:  -Group instruction provided by verbal instruction and written materials to support subject.  Instructor reviews cardiac drug classes: angiotensin converting enzyme inhibitors (ACE-I), angiotensin II receptor blockers (ARBs), nitrates, and calcium channel blockers.  Instructor discusses reasons, side effects, and lifestyle considerations for each drug class.   Anatomy and Physiology of the Circulatory System:  Group verbal and written instruction and models provide basic cardiac anatomy and physiology, with the coronary electrical and arterial systems. Review of: AMI, Angina, Valve disease, Heart Failure, Peripheral Artery Disease, Cardiac Arrhythmia, Pacemakers, and the ICD.   Other Education:  -Group or individual verbal, written, or video instructions that support the educational goals of the cardiac rehab program.   Holiday Eating Survival Tips:  -Group instruction provided by PowerPoint slides, verbal discussion, and written materials to support subject matter. The instructor gives patients tips, tricks, and techniques to help them not only survive but enjoy the holidays despite the onslaught of food that accompanies the holidays.   Knowledge Questionnaire Score:  Knowledge Questionnaire Score - 11/07/20 1141       Knowledge Questionnaire Score   Pre Score 21/24             Core Components/Risk Factors/Patient Goals at Admission:  Personal Goals and Risk Factors at Admission - 11/07/20 1041       Core Components/Risk Factors/Patient Goals on Admission    Weight Management Yes;Obesity;Weight Loss     Intervention Weight Management/Obesity: Establish reasonable short term and long term weight goals.;Obesity: Provide education and appropriate resources to help participant work on and attain dietary goals.    Admit Weight 241 lb 13.5 oz (109.7 kg)    Goal Weight: Short Term 235 lb (106.6 kg)    Goal Weight: Long Term 199 lb (90.3 kg)    Expected Outcomes Short Term: Continue to assess and modify interventions until short term weight is achieved;Long Term: Adherence to nutrition and physical activity/exercise program aimed toward attainment of established weight goal;Weight Loss: Understanding of general recommendations for a balanced deficit meal plan, which promotes 1-2 lb weight loss per week and includes a negative energy balance of [phone] kcal/d    Diabetes Yes    Intervention Provide education about signs/symptoms and action to take for hypo/hyperglycemia.;Provide education about proper nutrition, including hydration,  and aerobic/resistive exercise prescription along with prescribed medications to achieve blood glucose in normal ranges: Fasting glucose 65-99 mg/dL    Expected Outcomes Short Term: Participant verbalizes understanding of the signs/symptoms and immediate care of hyper/hypoglycemia, proper foot care and importance of medication, aerobic/resistive exercise and nutrition plan for blood glucose control.;Long Term: Attainment of HbA1C < 7%.    Hypertension Yes    Intervention Provide education on lifestyle modifcations including regular physical activity/exercise, weight management, moderate sodium restriction and increased consumption of fresh fruit, vegetables, and low fat dairy, alcohol moderation, and smoking cessation.;Monitor prescription use compliance.    Expected Outcomes Short Term: Continued assessment and intervention until BP is < 140/64m HG in hypertensive participants. < 130/871mHG in hypertensive participants with diabetes, heart failure or chronic kidney disease.;Long  Term: Maintenance of blood pressure at goal levels.    Lipids Yes    Intervention Provide education and support for participant on nutrition & aerobic/resistive exercise along with prescribed medications to achieve LDL <7083mHDL >52m47m  Expected Outcomes Short Term: Participant states understanding of desired cholesterol values and is compliant with medications prescribed. Participant is following exercise prescription and nutrition guidelines.;Long Term: Cholesterol controlled with medications as prescribed, with individualized exercise RX and with personalized nutrition plan. Value goals: LDL < 70mg23mL > 40 mg.    Stress Yes    Intervention Offer individual and/or small group education and counseling on adjustment to heart disease, stress management and health-related lifestyle change. Teach and support self-help strategies.;Refer participants experiencing significant psychosocial distress to appropriate mental health specialists for further evaluation and treatment. When possible, include family members and significant others in education/counseling sessions.    Expected Outcomes Short Term: Participant demonstrates changes in health-related behavior, relaxation and other stress management skills, ability to obtain effective social support, and compliance with psychotropic medications if prescribed.;Long Term: Emotional wellbeing is indicated by absence of clinically significant psychosocial distress or social isolation.             Core Components/Risk Factors/Patient Goals Review:   Goals and Risk Factor Review     Row Name 11/14/20 0818 01/16/21 1137 02/13/21 1514         Core Components/Risk Factors/Patient Goals Review   Personal Goals Review Weight Management/Obesity;Diabetes;Hypertension;Stress;Lipids Weight Management/Obesity;Diabetes;Hypertension;Stress;Lipids Weight Management/Obesity;Diabetes;Hypertension;Stress;Lipids     Review Brandon [name] exercise on 11/13/20. Anterio's  vital signs and CBG's were stabe TerryJerellbeen doing fair with exercise at cardiac rehab. Vital signs and CBG's have been stable. TerryDossiebeen having angina at home and is scheduled for a cath on 01/17/21. Brandon [name] plans to return to exercise on 02/14/21 per Brandon [name] on 01/17/21. Exercise has been on hold.     Expected Outcomes TerryCheskel continue to participate in phase 2 cardiac rehab for exercise nutrition and lifestyle modification TerryQuantarius continue to participate in phase 2 cardiac rehab for exercise nutrition and lifestyle modification when he returns to exercise at cardiac rehab TerryLatravion continue to participate in phase 2 cardiac rehab for exercise nutrition and lifestyle modification when he returns to exercise at cardiac rehab              Core Components/Risk Factors/Patient Goals at Discharge (Final Review):   Goals and Risk Factor Review - 02/13/21 1514       Core Components/Risk Factors/Patient Goals Review   Personal Goals Review Weight Management/Obesity;Diabetes;Hypertension;Stress;Lipids    Review TerryLonneys to return to exercise on 02/14/21 per Brandon Dopppost  [name] on 01/17/21. Exercise has been on hold.    Expected Outcomes Kevaughn will continue to participate in phase 2 cardiac rehab for exercise nutrition and lifestyle modification when he returns to exercise at cardiac rehab             ITP Comments:  ITP Comments     Row Name 11/07/20 1051 11/14/20 0815 11/20/20 1630 12/18/20 1411 01/16/21 1133   ITP Comments Dr Fransico Him MD, Medical Director 30 Day ITP Review. Raylyn started exercise on 11/13/20 and did fair with exercise 30 Day ITP Review. Maksymilian is off to a good start to exercise despite orthapedic limitations 30 Day ITP Review. Wyett has good particpation in cardiac rehab andf fair attendance. Leary was out last week due to moving to a new apartment with his family 28 Day ITP Review. Penn has good particpation in cardiac rehab. Daltin is  scheduled for a heart catherization. Demetrion was supposed to complete cardiac rehab on 01/12/21. Will wait for results to determine graduation date.    [place] Name 02/13/21 1511           ITP Comments 30 Day ITP Review. Esten's exercise has been on hold since his [name] placement on 01/17/21. Lewis will return to cardiac rehab on 02/14/21                Comments: See ITP Comments

## 2021-02-15 ENCOUNTER — Ambulatory Visit (INDEPENDENT_AMBULATORY_CARE_PROVIDER_SITE_OTHER): Payer: No Typology Code available for payment source | Admitting: Rehabilitative and Restorative Service Providers"

## 2021-02-15 DIAGNOSIS — M545 Low back pain, unspecified: Secondary | ICD-10-CM

## 2021-02-15 DIAGNOSIS — M6281 Muscle weakness (generalized): Secondary | ICD-10-CM | POA: Diagnosis not present

## 2021-02-15 DIAGNOSIS — R2689 Other abnormalities of gait and mobility: Secondary | ICD-10-CM | POA: Diagnosis not present

## 2021-02-15 NOTE — Therapy (Addendum)
Period Weeks    Status Achieved      PT LONG TERM GOAL #4   Title The patient will be further assessed on Berg balance, and gait speed and goals to follow as indicated.    Baseline Berg on 4/26 was 40/56 and gait speed 2.26 ft/sec    Time 6    Period Weeks    Status Achieved      PT LONG TERM GOAL #5   Title The patient will be able to move floor<>stand with UE support due to h/o falls.    Time 6    Period Weeks    Status On-going                   Plan - 02/15/21 1053     Clinical Impression Statement Worked on floor<>stand.  Patient is mod indep with UE support moving from standing to 1/2 knee to tall knee to quadriped.  He feels getting up/down from floor and squatting are still somewhat limited.  PT to continue to progress focusing on community exercise and transition from PT to gym based activities.    PT Treatment/Interventions Patient/family education;ADLs/Self Care Home Management;Aquatic Therapy;Neuromuscular re-education;Therapeutic exercise;Therapeutic activities;Gait training;Taping;Dry needling;Manual techniques;Vestibular    PT Next Visit Plan progress floor<>stand, work on functional squats, hamstring flexibility and work to remaining LTGs.  Aiming for d/c date of 03/09/21 per plan of care.  Will assess as we progress.    PT Home Exercise Plan [id number]    Consulted and Agree with Plan of Care Patient             Patient will benefit from skilled therapeutic intervention in order to improve the following deficits and impairments:     Visit Diagnosis: Bilateral low back pain without sciatica, unspecified chronicity  Muscle weakness (generalized)  Other abnormalities of gait and mobility     Problem List Patient Active Problem List   Diagnosis Date Noted   Unstable angina (HCC)    Vertigo 11/28/2020   Myelopathy (HCC) 11/28/2020   Insomnia  09/03/2020   Status post coronary artery stent placement    Mixed hyperlipidemia    Coronary artery disease    Chest pain 08/11/2020   Facet arthropathy, lumbar 06/22/2020   Major depression in partial remission (HCC) 06/04/2020   Nausea and vomiting 04/14/2020   Hyperlipidemia associated with type 2 diabetes mellitus (HCC) 04/14/2020   Primary hypertension 04/07/2020   Type 2 diabetes mellitus with other specified complication (HCC) 04/07/2020   SBO (small bowel obstruction) (HCC) 04/06/2020   Sepsis (HCC) 10/06/2019   Status post insertion of spinal cord stimulator 05/19/2017   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   Erectile dysfunction 10/09/2015    [name], PT 02/15/2021, 1:21 PM  [facility] [address] [place], [place], [address] Phone: [phone]   Fax:  [phone]  Name: Brandon [name] MRN: [id number] Date of Birth: 09/06/1955  Period Weeks    Status Achieved      PT LONG TERM GOAL #4   Title The patient will be further assessed on Berg balance, and gait speed and goals to follow as indicated.    Baseline Berg on 4/26 was 40/56 and gait speed 2.26 ft/sec    Time 6    Period Weeks    Status Achieved      PT LONG TERM GOAL #5   Title The patient will be able to move floor<>stand with UE support due to h/o falls.    Time 6    Period Weeks    Status On-going                   Plan - 02/15/21 1053     Clinical Impression Statement Worked on floor<>stand.  Patient is mod indep with UE support moving from standing to 1/2 knee to tall knee to quadriped.  He feels getting up/down from floor and squatting are still somewhat limited.  PT to continue to progress focusing on community exercise and transition from PT to gym based activities.    PT Treatment/Interventions Patient/family education;ADLs/Self Care Home Management;Aquatic Therapy;Neuromuscular re-education;Therapeutic exercise;Therapeutic activities;Gait training;Taping;Dry needling;Manual techniques;Vestibular    PT Next Visit Plan progress floor<>stand, work on functional squats, hamstring flexibility and work to remaining LTGs.  Aiming for d/c date of 03/09/21 per plan of care.  Will assess as we progress.    PT Home Exercise Plan [id number]    Consulted and Agree with Plan of Care Patient             Patient will benefit from skilled therapeutic intervention in order to improve the following deficits and impairments:     Visit Diagnosis: Bilateral low back pain without sciatica, unspecified chronicity  Muscle weakness (generalized)  Other abnormalities of gait and mobility     Problem List Patient Active Problem List   Diagnosis Date Noted   Unstable angina (HCC)    Vertigo 11/28/2020   Myelopathy (HCC) 11/28/2020   Insomnia  09/03/2020   Status post coronary artery stent placement    Mixed hyperlipidemia    Coronary artery disease    Chest pain 08/11/2020   Facet arthropathy, lumbar 06/22/2020   Major depression in partial remission (HCC) 06/04/2020   Nausea and vomiting 04/14/2020   Hyperlipidemia associated with type 2 diabetes mellitus (HCC) 04/14/2020   Primary hypertension 04/07/2020   Type 2 diabetes mellitus with other specified complication (HCC) 04/07/2020   SBO (small bowel obstruction) (HCC) 04/06/2020   Sepsis (HCC) 10/06/2019   Status post insertion of spinal cord stimulator 05/19/2017   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   Erectile dysfunction 10/09/2015    [name], PT 02/15/2021, 1:21 PM  [facility] [address] [place], [place], [address] Phone: [phone]   Fax:  [phone]  Name: Brandon [name] MRN: [id number] Date of Birth: 09/06/1955  [facility] [place] [address] [place], [place], [address] Phone: [phone]   Fax:  [phone]  Physical Therapy Treatment  Patient Details  Name: Brandon [name] MRN: [id number] Date of Birth: 10/03/1955 Referring Provider (PT): Rodney Langton, MD   Encounter Date: 02/15/2021   PT End of Session - 02/15/21 1024     Visit Number 13    Number of Visits 22    Date for PT Re-Evaluation 03/09/21    Authorization Type UMR    PT Start Time 1017    PT Stop Time 1102    PT Time Calculation (min) 45 min    Activity Tolerance Patient tolerated treatment well    Behavior During Therapy [facility] for tasks assessed/performed             Past Medical History:  Diagnosis Date   Anxiety    Basal cell carcinoma (BCC) in situ of skin    Colon polyps    Coronary artery disease    S/p DES to mRCA in 12/21 // S/p DES to LCx x 2 in 12/2020 // Diff dz in small to mod Dx (not a good target for PCI); OM1 100 CTO; severe dz in small OM2 and PDA >> Med Rx   Depression    Diabetes mellitus without complication (HCC)    HLD (hyperlipidemia)    Hypertension    SBO (small bowel obstruction) (HCC)     Past Surgical History:  Procedure Laterality Date   BACK SURGERY     CARDIAC CATHETERIZATION     CATARACT EXTRACTION W/ INTRAOCULAR LENS  IMPLANT, BILATERAL     CHOLECYSTECTOMY     CHOLECYSTECTOMY, LAPAROSCOPIC     CORONARY STENT INTERVENTION N/A 08/15/2020   Procedure: CORONARY STENT INTERVENTION;  Surgeon: Corky Crafts, MD;  [facility]: [facility] INVASIVE CV LAB;  Service: Cardiovascular;  Laterality: N/A;   CORONARY STENT INTERVENTION N/A 01/17/2021   Procedure: CORONARY STENT INTERVENTION;  Surgeon: Kathleene Hazel, MD;  Location: [facility] INVASIVE CV LAB;  Service: Cardiovascular;  Laterality: N/A;   EYE SURGERY     INTRAVASCULAR ULTRASOUND/IVUS N/A 08/15/2020   Procedure: Intravascular Ultrasound/IVUS;  Surgeon: Corky Crafts,  MD;  Location: [facility] INVASIVE CV LAB;  Service: Cardiovascular;  Laterality: N/A;   LEFT HEART CATH AND CORONARY ANGIOGRAPHY N/A 08/15/2020   Procedure: LEFT HEART CATH AND CORONARY ANGIOGRAPHY;  Surgeon: Corky Crafts, MD;  Location: [facility] INVASIVE CV LAB;  Service: Cardiovascular;  Laterality: N/A;   LEFT HEART CATH AND CORONARY ANGIOGRAPHY N/A 01/17/2021   Procedure: LEFT HEART CATH AND CORONARY ANGIOGRAPHY;  Surgeon: Kathleene Hazel, MD;  Location: [facility] INVASIVE CV LAB;  Service: Cardiovascular;  Laterality: N/A;   LUMBAR FUSION     L3-5 with rods   XI ROBOTIC ASSISTED INGUINAL HERNIA REPAIR WITH MESH     x 3 surgeries    There were no vitals filed for this visit.   Subjective Assessment - 02/15/21 1015     Subjective The patient is noting improvement with mobility. He went to the pool by himself over the weekend and returned to cardiac rehab this week.    Pertinent History diabetes, HTN, h/o low back pain since 2006 (fell in the shower)-- has spinal cord stimulator;  coronary artery disease    Patient Stated Goals to be able to kneel and rise.    Currently in Pain? No/denies                [facility] PT

## 2021-02-15 NOTE — Patient Instructions (Signed)
Access Code: [email or web address] URL: [email or web address] Date: 02/15/2021 Prepared by: Margretta Ditty  Exercises Gastroc Stretch on Wall - 2 x daily - 7 x weekly - 1 sets - 2 reps - 30 seconds hold Wall Quarter Squat - 2 x daily - 7 x weekly - 1 sets - 5 reps Standing with Forearms Thoracic Rotation - 2 x daily - 7 x weekly - 1 sets - 10 reps Romberg Stance with Eyes Closed - 2 x daily - 7 x weekly - 1 sets - 3 reps - 20-30 seconds hold Heel Walking - 2 x daily - 7 x weekly - 1 sets - 10 reps Toe Walking - 2 x daily - 7 x weekly - 1 sets - 10 reps Single Leg Stance with Support - 2 x daily - 7 x weekly - 1 sets - 3 reps - 10-15 seconds hold Sit to Stand with Armchair - 2 x daily - 7 x weekly - 1 sets - 5 reps Supine Hamstring Stretch with Strap - 2 x daily - 7 x weekly - 1 sets - 3 reps - 30 seconds hold Sidelying Hip Abduction - 2 x daily - 7 x weekly - 1 sets - 10 reps - 3 seconds hold

## 2021-02-16 ENCOUNTER — Ambulatory Visit (INDEPENDENT_AMBULATORY_CARE_PROVIDER_SITE_OTHER): Payer: No Typology Code available for payment source | Admitting: Physical Therapy

## 2021-02-16 ENCOUNTER — Other Ambulatory Visit: Payer: Self-pay

## 2021-02-16 ENCOUNTER — Encounter: Payer: Self-pay | Admitting: Physical Therapy

## 2021-02-16 DIAGNOSIS — R2689 Other abnormalities of gait and mobility: Secondary | ICD-10-CM

## 2021-02-16 DIAGNOSIS — M545 Low back pain, unspecified: Secondary | ICD-10-CM

## 2021-02-16 DIAGNOSIS — M6281 Muscle weakness (generalized): Secondary | ICD-10-CM | POA: Diagnosis not present

## 2021-02-16 NOTE — Therapy (Signed)
[facility] [place] [address] [place], [place], [address] Phone: [phone]   Fax:  [phone]  Physical Therapy Treatment  Patient Details  Name: Brandon [name] MRN: [id number] Date of Birth: November 07, 1955 Referring Provider (PT): Rodney Langton, MD   Encounter Date: 02/16/2021   PT End of Session - 02/16/21 1330     Visit Number 14    Number of Visits 22    Date for PT Re-Evaluation 03/09/21    Authorization Type UMR    PT Start Time 1517    PT Stop Time 1555    PT Time Calculation (min) 38 min    Activity Tolerance Patient tolerated treatment well    Behavior During Therapy [facility] for tasks assessed/performed             Past Medical History:  Diagnosis Date   Anxiety    Basal cell carcinoma (BCC) in situ of skin    Colon polyps    Coronary artery disease    S/p DES to mRCA in 12/21 // S/p DES to LCx x 2 in 12/2020 // Diff dz in small to mod Dx (not a good target for PCI); OM1 100 CTO; severe dz in small OM2 and PDA >> Med Rx   Depression    Diabetes mellitus without complication (HCC)    HLD (hyperlipidemia)    Hypertension    SBO (small bowel obstruction) (HCC)     Past Surgical History:  Procedure Laterality Date   BACK SURGERY     CARDIAC CATHETERIZATION     CATARACT EXTRACTION W/ INTRAOCULAR LENS  IMPLANT, BILATERAL     CHOLECYSTECTOMY     CHOLECYSTECTOMY, LAPAROSCOPIC     CORONARY STENT INTERVENTION N/A 08/15/2020   Procedure: CORONARY STENT INTERVENTION;  Surgeon: Corky Crafts, MD;  [facility]: [facility] IN[facility] CV [facility];  Service: Cardiovascular;  Laterality: N/A;   CORONARY STENT INTERVENTION N/A 01/17/2021   Procedure: CORONARY STENT INTERVENTION;  Surgeon: Kathleene Hazel, MD;  Location: [facility] IN[facility] CV [facility];  Service: Cardiovascular;  Laterality: N/A;   EYE SURGERY     INTRAVASCULAR ULTRASOUND/IVUS N/A 08/15/2020   Procedure: Intravascular Ultrasound/IVUS;  Surgeon: Corky Crafts,  MD;  Location: [facility] INVASIVE CV LAB;  Service: Cardiovascular;  Laterality: N/A;   LEFT HEART CATH AND CORONARY ANGIOGRAPHY N/A 08/15/2020   Procedure: LEFT HEART CATH AND CORONARY ANGIOGRAPHY;  Surgeon: Corky Crafts, MD;  Location: [facility] INVASIVE CV LAB;  Service: Cardiovascular;  Laterality: N/A;   LEFT HEART CATH AND CORONARY ANGIOGRAPHY N/A 01/17/2021   Procedure: LEFT HEART CATH AND CORONARY ANGIOGRAPHY;  Surgeon: Kathleene Hazel, MD;  Location: [facility] INVASIVE CV LAB;  Service: Cardiovascular;  Laterality: N/A;   LUMBAR FUSION     L3-5 with rods   XI ROBOTIC ASSISTED INGUINAL HERNIA REPAIR WITH MESH     x 3 surgeries    There were no vitals filed for this visit.   Subjective Assessment - 02/16/21 1323     Subjective Pt reports he can now bend down to pck up poop without getting dizzy on way up- he believes it has to do with him taking his time to come up.  He reports his legs have gotten stronger since starting therapy.  He can now lift groceries and a 15# bag of dog food without difficulty.    Pertinent History diabetes, HTN, h/o low back pain since 2006 (fell in the shower)-- has spinal cord stimulator;  coronary artery disease    Patient  Stated Goals to be able to kneel and rise.    Currently in Pain? Yes    Pain Score 2     Pain Location Back    Pain Orientation Lower    Pain Descriptors / Indicators Discomfort    Aggravating Factors  walking long distances, twisting    Pain Relieving Factors sitting and resting            Pt seen for aquatic therapy today.  Treatment took place in water 3.25-4 ft in depth at the [facility] pool. Temp of water was 91.  Pt entered/exited the pool via stairs independently with bilat rail.  Treatment:   Gait (with/without UE support on wall):  forward and backward.  High knee marching.   Holding on to side of pool:   Side to side lunge x 10 each direction.  Split squats x 10 each.   Heel /toe raises x 10.  Hip circles  CW/CW x 5-10 each side; limited range on Rt hip. Side stretch with single arm overhead x 5 each side (limited tolerance for Rt side stretch)  Hamstring stretch and forward lunge with foot on pool bench x 15 sec each, 3 reps   Without support:  Sit to/from stand with added abdominal set (when lifting feet off of floor) and side step Rt/Lt   SLS with intermittent UE to steady x 10-15 sec x 3 reps each leg.   Trialed hand and Rt knee on bench with attempt to hip sit - limited ability and tolerance; stopped.    Ai chi: Closing x 5 reps each side.   Staggered stance with pool noodle lifts overhead x 5, each leg forward.    Pt requires buoyancy for support and to offload joints with strengthening exercises. Viscosity of the water is needed for resistance of strengthening; water current perturbations provides challenge to standing balance unsupported, requiring increased core activation.      PT Long Term Goals - 02/15/21 1051       PT LONG TERM GOAL #1   Title The patient will return demo HEP progression for flexibility, lumbar mobility, and core stability.    Time 6    Period Weeks    Status On-going    Target Date 03/09/21      PT LONG TERM GOAL #2   Title The patient will improve Berg balance score from 40/56 to > or equal to 46/56 to demo dec'd risk for falls.    Baseline 48/56 on 01/30/21    Time 6    Period Weeks    Status Achieved      PT LONG TERM GOAL #3   Title The patient will improve hamstring flexibility R side to -30 from full extension (90/90 supine starting position).    Baseline -25 on 01/30/21    Time 6    Period Weeks    Status Achieved      PT LONG TERM GOAL #4   Title The patient will be further assessed on Berg balance, and gait speed and goals to follow as indicated.    Baseline Berg on 4/26 was 40/56 and gait speed 2.26 ft/sec    Time 6    Period Weeks    Status Achieved      PT LONG TERM GOAL #5   Title The patient will be able to move floor<>stand  with UE support due to h/o falls.    Time 6    Period Weeks    Status On-going  Plan - 02/16/21 1357     Clinical Impression Statement Pt's control with sit to stand and abd set with foot lift (when on bench) demonstrates much improved core support in water.  he complained of limited Rt hip ROM and single leg balance, as well as limited ability for Rt side stretch (Lt lean).  He is otherwise tolerating aquatic exercises well and verbalized readiness to be independent with aquatic HEP after next water appt.    Comorbidities diabetes, HTN, h/o low back pain since 2006 (fell in the shower)-- has spinal cord stimulator; coronary artery disease    Rehab Potential Good    PT Frequency 2x / week    PT Duration 6 weeks    PT Treatment/Interventions Patient/family education;ADLs/Self Care Home Management;Aquatic Therapy;Neuromuscular re-education;Therapeutic exercise;Therapeutic activities;Gait training;Taping;Dry needling;Manual techniques;Vestibular    PT Next Visit Plan progress floor<>stand, work on functional squats, hamstring flexibility and work to remaining LTGs.  Aiming for d/c date of 03/09/21 per plan of care.  Will assess as we progress.    PT Home Exercise Plan [id number]    Consulted and Agree with Plan of Care Patient             Patient will benefit from skilled therapeutic intervention in order to improve the following deficits and impairments:  Pain, Dizziness, Decreased range of motion, Decreased strength, Hypomobility, Impaired flexibility, Postural dysfunction, Decreased balance, Decreased activity tolerance  Visit Diagnosis: Bilateral low back pain without sciatica, unspecified chronicity  Muscle weakness (generalized)  Other abnormalities of gait and mobility     Problem List Patient Active Problem List   Diagnosis Date Noted   Unstable angina (HCC)    Vertigo 11/28/2020   Myelopathy (HCC) 11/28/2020   Insomnia 09/03/2020   Status post  coronary artery stent placement    Mixed hyperlipidemia    Coronary artery disease    Chest pain 08/11/2020   Facet arthropathy, lumbar 06/22/2020   Major depression in partial remission (HCC) 06/04/2020   Nausea and vomiting 04/14/2020   Hyperlipidemia associated with type 2 diabetes mellitus (HCC) 04/14/2020   Primary hypertension 04/07/2020   Type 2 diabetes mellitus with other specified complication (HCC) 04/07/2020   SBO (small bowel obstruction) (HCC) 04/06/2020   Sepsis (HCC) 10/06/2019   Status post insertion of spinal cord stimulator 05/19/2017   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   Erectile dysfunction 10/09/2015   Mayer Camel, PTA 02/16/21 1:59 PM  [facility] [place] [address] [place], [place], [address] Phone: [phone]   Fax:  [phone]  Name: GALE HULSE MRN: [id number] Date of Birth: 27-May-1956

## 2021-02-19 ENCOUNTER — Other Ambulatory Visit: Payer: Self-pay

## 2021-02-19 ENCOUNTER — Encounter (HOSPITAL_COMMUNITY)
Admission: RE | Admit: 2021-02-19 | Discharge: 2021-02-19 | Disposition: A | Discharge status: Home / Self Care | Payer: No Typology Code available for payment source | Source: Ambulatory Visit | Attending: Cardiology | Admitting: Cardiology

## 2021-02-19 DIAGNOSIS — Z955 Presence of coronary angioplasty implant and graft: Secondary | ICD-10-CM

## 2021-02-19 NOTE — Progress Notes (Signed)
Nutrition Note  Spoke with pt. Download dexcom completed and reviewed with pt.  Continuous glucose monitoring download:      Average is    122  for 14 days   Time sensor is active   99.3 %   Time in range (70-180 mg/dL):  95 % (Goal >16%)   Time High (181-250 mg/dL)  4 % (Goal < 10%)   Time Very High (>250 mg/dl)  0 % (Goal < 5%)   Time Low (54-69 mg/dL)  1 % (Goal is <9%)   Time Very Low (<54)  0%  (Goal <1%)   Coefficient of variation  23% (Goal is <36%)       He started Ozempic 0.5 mg a month ago. He has lost 13 lbs. He has reduced portions.  Diet recall: Breakfast: sausage/cheese biscuit (made at home) Lunch: Lean cuisine OR chicfila nuggets OR meat/starch/veggie when out  Dinner: Spaghetti OR chicken alfredo OR meat/veggie  Snacks: nabs, frozen yogurt, yogurt  Reviewed ways to incorporate more fruits, vegetables, and whole grains to increase the nutritional adequacy of his diet.  He wants to continue losing weight with goal of <210 lbs.  He continues to eat smaller portions and wants to start walking more. He has joined [employer] and plans to go 3 days per week.   Nutrition Diagnosis   Obese  II = 35-39.9 related to excessive energy intake and sedentary lifestyle as evidenced by a BMI 35.88 kg/m2   Nutrition Intervention   Pt's individual nutrition plan reviewed with pt. Benefits of adopting Heart Healthy diet discussed when Picture Your Plate reviewed. Continue client-centered nutrition education by RD, as part of interdisciplinary care.   Goal(s) Pt to identify food quantities necessary to achieve weight loss of 6-24 lb at graduation from cardiac rehab. Pt to build a healthy plate including vegetables, fruits, whole grains, and low-fat dairy products in a heart healthy meal plan. Pt to continue checking CBGs and keep WNL.   Plan:  Will provide client-centered nutrition education as part of interdisciplinary care Monitor and evaluate progress toward nutrition goal  with team.   Andrey Campanile, MS, RDN, LDN

## 2021-02-20 ENCOUNTER — Ambulatory Visit (INDEPENDENT_AMBULATORY_CARE_PROVIDER_SITE_OTHER): Payer: No Typology Code available for payment source | Admitting: Rehabilitative and Restorative Service Providers"

## 2021-02-20 DIAGNOSIS — R2689 Other abnormalities of gait and mobility: Secondary | ICD-10-CM | POA: Diagnosis not present

## 2021-02-20 DIAGNOSIS — M6281 Muscle weakness (generalized): Secondary | ICD-10-CM | POA: Diagnosis not present

## 2021-02-20 DIAGNOSIS — M545 Low back pain, unspecified: Secondary | ICD-10-CM | POA: Diagnosis not present

## 2021-02-20 NOTE — Patient Instructions (Signed)
Access Code: [email or web address] URL: [email or web address] Date: 02/20/2021 Prepared by: Margretta Ditty  Exercises Gastroc Stretch on Wall - 1 x daily - 2 x weekly - 1 sets - 2 reps - 30 seconds hold Standing with Forearms Thoracic Rotation - 1 x daily - 2 x weekly - 1 sets - 10 reps Single Leg Stance with Support - 1 x daily - 2 x weekly - 1 sets - 3 reps - 10-15 seconds hold Squat with Chair Touch - 2 x daily - 7 x weekly - 1 sets - 5-10 reps Supine Hamstring Stretch with Strap - 1 x daily - 2 x weekly - 1 sets - 3 reps - 30 seconds hold Sidelying Hip Abduction - 1 x daily - 2 x weekly - 1 sets - 10 reps - 3 seconds hold

## 2021-02-20 NOTE — Therapy (Signed)
[facility] [place] [address] [place], [place], [address] Phone: [phone]   Fax:  [phone]  Physical Therapy Treatment  Patient Details  Name: Brandon [name] MRN: [id number] Date of Birth: 1955/11/29 Referring Provider (PT): Rodney Langton, MD   Encounter Date: 02/20/2021   PT End of Session - 02/20/21 1145     Visit Number 15    Number of Visits 22    Date for PT Re-Evaluation 03/09/21    Authorization Type UMR    PT Start Time 1106    PT Stop Time 1148    PT Time Calculation (min) 42 min    Activity Tolerance Patient tolerated treatment well    Behavior During Therapy [facility] for tasks assessed/performed             Past Medical History:  Diagnosis Date   Anxiety    Basal cell carcinoma (BCC) in situ of skin    Colon polyps    Coronary artery disease    S/p DES to mRCA in 12/21 // S/p DES to LCx x 2 in 12/2020 // Diff dz in small to mod Dx (not a good target for PCI); OM1 100 CTO; severe dz in small OM2 and PDA >> Med Rx   Depression    Diabetes mellitus without complication (HCC)    HLD (hyperlipidemia)    Hypertension    SBO (small bowel obstruction) (HCC)     Past Surgical History:  Procedure Laterality Date   BACK SURGERY     CARDIAC CATHETERIZATION     CATARACT EXTRACTION W/ INTRAOCULAR LENS  IMPLANT, BILATERAL     CHOLECYSTECTOMY     CHOLECYSTECTOMY, LAPAROSCOPIC     CORONARY STENT INTERVENTION N/A 08/15/2020   Procedure: CORONARY STENT INTERVENTION;  Surgeon: Corky Crafts, MD;  [facility];  Service: Cardiovascular;  Laterality: N/A;   CORONARY STENT INTERVENTION N/A 01/17/2021   Procedure: CORONARY STENT INTERVENTION;  Surgeon: Kathleene Hazel, MD;  [facility]: [facility];  Service: Cardiovascular;  Laterality: N/A;   EYE SURGERY     INTRAVASCULAR ULTRASOUND/IVUS N/A 08/15/2020   Procedure: Intravascular Ultrasound/IVUS;  Surgeon: Corky Crafts,  MD;  Location: [facility] INVASIVE CV LAB;  Service: Cardiovascular;  Laterality: N/A;   LEFT HEART CATH AND CORONARY ANGIOGRAPHY N/A 08/15/2020   Procedure: LEFT HEART CATH AND CORONARY ANGIOGRAPHY;  Surgeon: Corky Crafts, MD;  Location: [facility] INVASIVE CV LAB;  Service: Cardiovascular;  Laterality: N/A;   LEFT HEART CATH AND CORONARY ANGIOGRAPHY N/A 01/17/2021   Procedure: LEFT HEART CATH AND CORONARY ANGIOGRAPHY;  Surgeon: Kathleene Hazel, MD;  [facility];  Service: Cardiovascular;  Laterality: N/A;   LUMBAR FUSION     L3-5 with rods   XI ROBOTIC ASSISTED INGUINAL HERNIA REPAIR WITH MESH     x 3 surgeries    There were no vitals filed for this visit.   Subjective Assessment - 02/20/21 1110     Subjective The patient reports he is sore today from going to the pool on Saturday/Sunday and then to cardiac rehab yesterday.  He tried to get on the floor at home and it was challenging.    Pertinent History diabetes, HTN, h/o low back pain since 2006 (fell in the shower)-- has spinal cord stimulator;  coronary artery disease    Patient Stated Goals to be able to kneel and rise.    Currently in Pain? No/denies  Period Weeks    Status On-going                   Plan - 02/20/21 1146     Clinical Impression Statement The patient was able to transition supine>sidelying>sidelying on elbow>quadriped today.  This should help him get into quadriped to transition from floor<>stand.  PT to continue working to LTGs with anticipated upcoming d/c.    PT Treatment/Interventions Patient/family education;ADLs/Self Care Home Management;Aquatic Therapy;Neuromuscular re-education;Therapeutic exercise;Therapeutic activities;Gait training;Taping;Dry needling;Manual techniques;Vestibular    PT Next Visit Plan progress floor<>stand, work on functional squats, hamstring flexibility and work to remaining LTGs.  Aiming for d/c date of 03/09/21 per plan of care.  Will assess as we progress.    PT Home Exercise Plan [email or web address]    Consulted and Agree with Plan of Care Patient             Patient will benefit from skilled therapeutic intervention in order to improve the following deficits and impairments:  Pain, Dizziness, Decreased range of motion, Decreased strength, Hypomobility, Impaired flexibility, Postural dysfunction, Decreased balance, Decreased activity tolerance  Visit Diagnosis: Bilateral low back pain without sciatica, unspecified chronicity  Muscle weakness (generalized)  Other abnormalities of gait and mobility     Problem List Patient Active Problem List   Diagnosis Date Noted   Unstable angina (HCC)    Vertigo 11/28/2020   Myelopathy (HCC) 11/28/2020   Insomnia 09/03/2020   Status post coronary artery stent placement    Mixed hyperlipidemia    Coronary artery disease    Chest pain 08/11/2020   Facet arthropathy, lumbar 06/22/2020   Major depression in partial remission (HCC) 06/04/2020   Nausea and vomiting 04/14/2020   Hyperlipidemia associated with type 2 diabetes mellitus (HCC) 04/14/2020   Primary  hypertension 04/07/2020   Type 2 diabetes mellitus with other specified complication (HCC) 04/07/2020   SBO (small bowel obstruction) (HCC) 04/06/2020   Sepsis (HCC) 10/06/2019   Status post insertion of spinal cord stimulator 05/19/2017   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   Erectile dysfunction 10/09/2015    Maribell Demeo, PT 02/20/2021, 1:09 PM  [facility] [address] [place], [place], [address] Phone: [phone]   Fax:  [phone]  Name: Brandon [name] MRN: [id number] Date of Birth: [birth year]  [facility] [place] [address] [place], [place], [address] Phone: [phone]   Fax:  [phone]  Physical Therapy Treatment  Patient Details  Name: Brandon [name] MRN: [id number] Date of Birth: 1955/11/29 Referring Provider (PT): Rodney Langton, MD   Encounter Date: 02/20/2021   PT End of Session - 02/20/21 1145     Visit Number 15    Number of Visits 22    Date for PT Re-Evaluation 03/09/21    Authorization Type UMR    PT Start Time 1106    PT Stop Time 1148    PT Time Calculation (min) 42 min    Activity Tolerance Patient tolerated treatment well    Behavior During Therapy [facility] for tasks assessed/performed             Past Medical History:  Diagnosis Date   Anxiety    Basal cell carcinoma (BCC) in situ of skin    Colon polyps    Coronary artery disease    S/p DES to mRCA in 12/21 // S/p DES to LCx x 2 in 12/2020 // Diff dz in small to mod Dx (not a good target for PCI); OM1 100 CTO; severe dz in small OM2 and PDA >> Med Rx   Depression    Diabetes mellitus without complication (HCC)    HLD (hyperlipidemia)    Hypertension    SBO (small bowel obstruction) (HCC)     Past Surgical History:  Procedure Laterality Date   BACK SURGERY     CARDIAC CATHETERIZATION     CATARACT EXTRACTION W/ INTRAOCULAR LENS  IMPLANT, BILATERAL     CHOLECYSTECTOMY     CHOLECYSTECTOMY, LAPAROSCOPIC     CORONARY STENT INTERVENTION N/A 08/15/2020   Procedure: CORONARY STENT INTERVENTION;  Surgeon: Corky Crafts, MD;  [facility];  Service: Cardiovascular;  Laterality: N/A;   CORONARY STENT INTERVENTION N/A 01/17/2021   Procedure: CORONARY STENT INTERVENTION;  Surgeon: Kathleene Hazel, MD;  [facility];  Service: Cardiovascular;  Laterality: N/A;   EYE SURGERY     INTRAVASCULAR ULTRASOUND/IVUS N/A 08/15/2020   Procedure: Intravascular Ultrasound/IVUS;  Surgeon: Corky Crafts,  MD;  Location: [facility] INVASIVE CV LAB;  Service: Cardiovascular;  Laterality: N/A;   LEFT HEART CATH AND CORONARY ANGIOGRAPHY N/A 08/15/2020   Procedure: LEFT HEART CATH AND CORONARY ANGIOGRAPHY;  Surgeon: Corky Crafts, MD;  Location: [facility] INVASIVE CV LAB;  Service: Cardiovascular;  Laterality: N/A;   LEFT HEART CATH AND CORONARY ANGIOGRAPHY N/A 01/17/2021   Procedure: LEFT HEART CATH AND CORONARY ANGIOGRAPHY;  Surgeon: Kathleene Hazel, MD;  [facility];  Service: Cardiovascular;  Laterality: N/A;   LUMBAR FUSION     L3-5 with rods   XI ROBOTIC ASSISTED INGUINAL HERNIA REPAIR WITH MESH     x 3 surgeries    There were no vitals filed for this visit.   Subjective Assessment - 02/20/21 1110     Subjective The patient reports he is sore today from going to the pool on Saturday/Sunday and then to cardiac rehab yesterday.  He tried to get on the floor at home and it was challenging.    Pertinent History diabetes, HTN, h/o low back pain since 2006 (fell in the shower)-- has spinal cord stimulator;  coronary artery disease    Patient Stated Goals to be able to kneel and rise.    Currently in Pain? No/denies

## 2021-02-21 ENCOUNTER — Encounter (HOSPITAL_COMMUNITY): Payer: No Typology Code available for payment source

## 2021-02-21 ENCOUNTER — Other Ambulatory Visit (HOSPITAL_COMMUNITY): Payer: Self-pay

## 2021-02-21 ENCOUNTER — Other Ambulatory Visit: Payer: Self-pay | Admitting: Sports Medicine

## 2021-02-21 DIAGNOSIS — M961 Postlaminectomy syndrome, not elsewhere classified: Secondary | ICD-10-CM

## 2021-02-21 MED ORDER — TRAMADOL HCL 50 MG PO TABS
100.0000 mg | ORAL_TABLET | Freq: Three times a day (TID) | ORAL | 0 refills | Status: DC | PRN
  Filled 2021-02-21: qty 180, 30d supply, fill #0

## 2021-02-23 ENCOUNTER — Other Ambulatory Visit (HOSPITAL_COMMUNITY): Payer: Self-pay

## 2021-02-23 ENCOUNTER — Ambulatory Visit: Payer: No Typology Code available for payment source | Admitting: Physical Therapy

## 2021-02-23 DEATH — deceased

## 2021-02-27 ENCOUNTER — Other Ambulatory Visit: Payer: Self-pay

## 2021-02-27 ENCOUNTER — Ambulatory Visit (INDEPENDENT_AMBULATORY_CARE_PROVIDER_SITE_OTHER): Payer: No Typology Code available for payment source | Admitting: Rehabilitative and Restorative Service Providers"

## 2021-02-27 DIAGNOSIS — M545 Low back pain, unspecified: Secondary | ICD-10-CM | POA: Diagnosis not present

## 2021-02-27 DIAGNOSIS — M6281 Muscle weakness (generalized): Secondary | ICD-10-CM | POA: Diagnosis not present

## 2021-02-27 DIAGNOSIS — R2689 Other abnormalities of gait and mobility: Secondary | ICD-10-CM

## 2021-02-27 NOTE — Patient Instructions (Signed)
Access Code: [email or web address] URL: [email or web address] Date: 02/27/2021 Prepared by: Margretta Ditty  Exercises Gastroc Stretch on Wall - 1 x daily - 2 x weekly - 1 sets - 2 reps - 30 seconds hold Standing with Forearms Thoracic Rotation - 1 x daily - 2 x weekly - 1 sets - 10 reps Single Leg Stance with Support - 1 x daily - 2 x weekly - 1 sets - 3 reps - 10-15 seconds hold Tandem Stance with Support - 2 x daily - 7 x weekly - 1 sets - 2 reps - 30 seconds hold Squat with Chair Touch - 2 x daily - 7 x weekly - 1 sets - 5-10 reps Supine Hamstring Stretch with Strap - 1 x daily - 2 x weekly - 1 sets - 3 reps - 30 seconds hold Sidelying Hip Abduction - 1 x daily - 2 x weekly - 1 sets - 10 reps - 3 seconds hold

## 2021-02-27 NOTE — Therapy (Signed)
[facility] [place] [address] [place], [place], [address] Phone: [phone]   Fax:  [phone]  Physical Therapy Treatment  Patient Details  Name: Brandon [name] MRN: [id number] Date of Birth: Nov 19, 1955 Referring Provider (PT): Rodney Langton, MD   Encounter Date: 02/27/2021   PT End of Session - 02/27/21 1017     Visit Number 16    Number of Visits 22    Date for PT Re-Evaluation 03/09/21    Authorization Type UMR    PT Start Time 1014    PT Stop Time 1052    PT Time Calculation (min) 38 min    Activity Tolerance Patient tolerated treatment well    Behavior During Therapy [facility] for tasks assessed/performed             Past Medical History:  Diagnosis Date   Anxiety    Basal cell carcinoma (BCC) in situ of skin    Colon polyps    Coronary artery disease    S/p DES to mRCA in 12/21 // S/p DES to LCx x 2 in 12/2020 // Diff dz in small to mod Dx (not a good target for PCI); OM1 100 CTO; severe dz in small OM2 and PDA >> Med Rx   Depression    Diabetes mellitus without complication (HCC)    HLD (hyperlipidemia)    Hypertension    SBO (small bowel obstruction) (HCC)     Past Surgical History:  Procedure Laterality Date   BACK SURGERY     CARDIAC CATHETERIZATION     CATARACT EXTRACTION W/ INTRAOCULAR LENS  IMPLANT, BILATERAL     CHOLECYSTECTOMY     CHOLECYSTECTOMY, LAPAROSCOPIC     CORONARY STENT INTERVENTION N/A 08/15/2020   Procedure: CORONARY STENT INTERVENTION;  Surgeon: Corky Crafts, MD;  [facility];  Service: Cardiovascular;  Laterality: N/A;   CORONARY STENT INTERVENTION N/A 01/17/2021   Procedure: CORONARY STENT INTERVENTION;  Surgeon: Kathleene Hazel, MD;  [facility];  Service: Cardiovascular;  Laterality: N/A;   EYE SURGERY     INTRAVASCULAR ULTRASOUND/IVUS N/A 08/15/2020   Procedure: Intravascular Ultrasound/IVUS;  Surgeon: Corky Crafts,  MD;  Location: [facility] INVASIVE CV LAB;  Service: Cardiovascular;  Laterality: N/A;   LEFT HEART CATH AND CORONARY ANGIOGRAPHY N/A 08/15/2020   Procedure: LEFT HEART CATH AND CORONARY ANGIOGRAPHY;  Surgeon: Corky Crafts, MD;  Location: [facility] INVASIVE CV LAB;  Service: Cardiovascular;  Laterality: N/A;   LEFT HEART CATH AND CORONARY ANGIOGRAPHY N/A 01/17/2021   Procedure: LEFT HEART CATH AND CORONARY ANGIOGRAPHY;  Surgeon: Kathleene Hazel, MD;  [facility];  Service: Cardiovascular;  Laterality: N/A;   LUMBAR FUSION     L3-5 with rods   XI ROBOTIC ASSISTED INGUINAL HERNIA REPAIR WITH MESH     x 3 surgeries    There were no vitals filed for this visit.   Subjective Assessment - 02/27/21 1015     Subjective The patinet has been sitting with his dogs on the floor and is doing better raising up.  He is rolling onto hands and knees before rising from the floor.    Pertinent History diabetes, HTN, h/o low back pain since 2006 (fell in the shower)-- has spinal cord stimulator;  coronary artery disease    Patient Stated Goals to be able to kneel and rise.    Currently in Pain? No/denies  [facility] PT Assessment - 02/27/21 1019       Assessment   Medical Diagnosis vertigo  "Vestibular rehabilitation for vertigo as well as gait training and low back strengthening for lumbar spondylosis."    Referring Provider (PT) Rodney Langton, MD    Onset Date/Surgical Date 11/28/20      Ambulation/Gait   Ambulation/Gait Yes      Sharlene Motts Balance Test   Sit to Stand Able to stand without using hands and stabilize independently    Standing Unsupported Able to stand safely 2 minutes    Sitting with Back Unsupported but Feet Supported on Floor or Stool Able to sit safely and securely 2 minutes    Stand to Sit Sits safely with minimal use of hands    Transfers Able to transfer safely, minor use of hands    Standing Unsupported with Eyes Closed Able to stand 10  seconds safely    Standing Unsupported with Feet Together Able to place feet together independently and stand 1 minute safely    From Standing, Reach Forward with Outstretched Arm Can reach confidently >25 cm (10")    From Standing Position, Pick up Object from Floor Unable to pick up shoe, but reaches 2-5 cm (1-2") from shoe and balances independently    From Standing Position, Turn to Look Behind Over each Shoulder Turn sideways only but maintains balance    Turn 360 Degrees Able to turn 360 degrees safely in 4 seconds or less    Standing Unsupported, Alternately Place Feet on Step/Stool Able to stand independently and safely and complete 8 steps in 20 seconds    Standing Unsupported, One Foot in Front Able to plae foot ahead of the other independently and hold 30 seconds    Standing on One Leg Able to lift leg independently and hold equal to or more than 3 seconds    Total Score 49                           [facility] Adult PT Treatment/Exercise - 02/27/21 1019       Self-Care   Self-Care Other Self-Care Comments    Other Self-Care Comments  post d/c program at [facility]      Therapeutic Activites    Therapeutic Activities Other Therapeutic Activities    Other Therapeutic Activities floor<>stand without assist using 1 UE for support      Neuro Re-ed    Neuro Re-ed Details  Single leg stance, tandem stance *added to HEP to continue working on balance      Exercises   Exercises Lumbar;Knee/Hip      Lumbar Exercises: Stretches   Active Hamstring Stretch Right;Left;2 reps;30 seconds    Gastroc Stretch Right;Left;2 reps;30 seconds      Lumbar Exercises: Aerobic   Nustep L5 x 5.5 minutes LEs only      Lumbar Exercises: Supine   Bridge 5 reps    Bridge Limitations *movement does't feel natural for low back      Lumbar Exercises: Sidelying   Hip Abduction 10 reps;Right;Left                    PT Education - 02/27/21 1042     Education  Details HEP    Person(s) Educated Patient    Methods Explanation;Demonstration;Handout    Comprehension Verbalized understanding;Returned demonstration                 PT Long Term  Goals - 02/27/21 1251       PT LONG TERM GOAL #1   Title The patient will return demo HEP progression for flexibility, lumbar mobility, and core stability.    Time 6    Period Weeks    Status On-going      PT LONG TERM GOAL #2   Title The patient will improve Berg balance score from 40/56 to > or equal to 46/56 to demo dec'd risk for falls.    Baseline 48/56 on 01/30/21    Time 6    Period Weeks    Status Achieved      PT LONG TERM GOAL #3   Title The patient will improve hamstring flexibility R side to -30 from full extension (90/90 supine starting position).    Baseline -25 on 01/30/21    Time 6    Period Weeks    Status Achieved      PT LONG TERM GOAL #4   Title The patient will be further assessed on Berg balance, and gait speed and goals to follow as indicated.    Baseline Berg on 4/26 was 40/56 and gait speed 2.26 ft/sec    Time 6    Period Weeks    Status Achieved      PT LONG TERM GOAL #5   Title The patient will be able to move floor<>stand with UE support due to h/o falls.    Time 6    Period Weeks    Status Achieved                   Plan - 02/27/21 1251     Clinical Impression Statement The patient has met LTGs with one more visit to review pool/aquatics program for post d/c.  He also has upcoming visit with trainer at [employer] to work on setting up gym routine to continue post end of cardiac rehab.  Patient was able to move floor<>stand today and HEP + balance activities reviewed.  Plan for d/c next session.    PT Treatment/Interventions Patient/family education;ADLs/Self Care Home Management;Aquatic Therapy;Neuromuscular re-education;Therapeutic exercise;Therapeutic activities;Gait training;Taping;Dry needling;Manual techniques;Vestibular    PT Next Visit  Plan Review aquatics program and d/c next session    PT Home Exercise Plan [id number]    Consulted and Agree with Plan of Care Patient             Patient will benefit from skilled therapeutic intervention in order to improve the following deficits and impairments:     Visit Diagnosis: Bilateral low back pain without sciatica, unspecified chronicity  Muscle weakness (generalized)  Other abnormalities of gait and mobility     Problem List Patient Active Problem List   Diagnosis Date Noted   Unstable angina (HCC)    Vertigo 11/28/2020   Myelopathy (HCC) 11/28/2020   Insomnia 09/03/2020   Status post coronary artery stent placement    Mixed hyperlipidemia    Coronary artery disease    Chest pain 08/11/2020   Facet arthropathy, lumbar 06/22/2020   Major depression in partial remission (HCC) 06/04/2020   Nausea and vomiting 04/14/2020   Hyperlipidemia associated with type 2 diabetes mellitus (HCC) 04/14/2020   Primary hypertension 04/07/2020   Type 2 diabetes mellitus with other specified complication (HCC) 04/07/2020   SBO (small bowel obstruction) (HCC) 04/06/2020   Sepsis (HCC) 10/06/2019   Status post insertion of spinal cord stimulator 05/19/2017   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   Erectile dysfunction 10/09/2015    Aarya Robinson 02/27/2021, 12:53 PM  [facility] [place] [address] [place], [place], [address] Phone: [phone]   Fax:  [phone]  Name: Brandon [name] MRN: [id number] Date of Birth: 04-May-1956

## 2021-02-28 ENCOUNTER — Encounter (HOSPITAL_COMMUNITY)
Admission: RE | Admit: 2021-02-28 | Discharge: 2021-02-28 | Disposition: A | Discharge status: Home / Self Care | Payer: No Typology Code available for payment source | Source: Ambulatory Visit | Attending: Cardiology | Admitting: Cardiology

## 2021-02-28 DIAGNOSIS — Z955 Presence of coronary angioplasty implant and graft: Secondary | ICD-10-CM | POA: Diagnosis present

## 2021-03-01 ENCOUNTER — Telehealth: Payer: Self-pay | Admitting: *Deleted

## 2021-03-01 NOTE — Telephone Encounter (Signed)
-----   Message from Gaynelle Cage, CMA sent at 02/14/2021  8:54 AM EDT ----- Regarding: RE: precert No PA is required. They just need to be sure they have a referral from the PCP. ----- Message ----- From: Reesa Chew, CMA Sent: 02/13/2021  11:00 AM EDT To: Cv Div Sleep Studies Subject: precert                                         in lab BiPAP titration

## 2021-03-02 ENCOUNTER — Encounter: Payer: Self-pay | Admitting: Physical Therapy

## 2021-03-02 ENCOUNTER — Other Ambulatory Visit: Payer: Self-pay

## 2021-03-02 ENCOUNTER — Ambulatory Visit (INDEPENDENT_AMBULATORY_CARE_PROVIDER_SITE_OTHER): Payer: No Typology Code available for payment source | Admitting: Physical Therapy

## 2021-03-02 DIAGNOSIS — M545 Low back pain, unspecified: Secondary | ICD-10-CM

## 2021-03-02 DIAGNOSIS — M6281 Muscle weakness (generalized): Secondary | ICD-10-CM | POA: Diagnosis not present

## 2021-03-02 DIAGNOSIS — R2689 Other abnormalities of gait and mobility: Secondary | ICD-10-CM | POA: Diagnosis not present

## 2021-03-02 NOTE — Therapy (Addendum)
[address] [place], [place], [address] Phone: [phone]   Fax:  [phone]  Physical Therapy Treatment and Discharge Summary  Patient Details  Name: Brandon [name] MRN: [id number] Date of Birth: 14-Feb-1956 Referring Provider (PT): Aundria Mems, MD   Encounter Date: 03/02/2021   PT End of Session - 03/02/21 1302     Visit Number 17    Number of Visits 22    Date for PT Re-Evaluation 03/09/21    Authorization Type UMR    PT Start Time 1249    Activity Tolerance Patient tolerated treatment well    Behavior During Therapy [facility] for tasks assessed/performed             Past Medical History:  Diagnosis Date   Anxiety    Basal cell carcinoma (BCC) in situ of skin    Colon polyps    Coronary artery disease    S/p DES to mRCA in 12/21 // S/p DES to LCx x 2 in 12/2020 // Diff dz in small to mod Dx (not a good target for PCI); OM1 100 CTO; severe dz in small OM2 and PDA >> Med Rx   Depression    Diabetes mellitus without complication ([place])    HLD (hyperlipidemia)    Hypertension    SBO (small bowel obstruction) ([place])     Past Surgical History:  Procedure Laterality Date   BACK SURGERY     CARDIAC CATHETERIZATION     CATARACT EXTRACTION W/ INTRAOCULAR LENS  IMPLANT, BILATERAL     CHOLECYSTECTOMY     CHOLECYSTECTOMY, LAPAROSCOPIC     CORONARY STENT INTERVENTION N/A 08/15/2020   Procedure: CORONARY STENT INTERVENTION;  Surgeon: Jettie Booze, MD;  Location: [place] [facility];  Service: Cardiovascular;  Laterality: N/A;   CORONARY STENT INTERVENTION N/A 01/17/2021   Procedure: CORONARY STENT INTERVENTION;  Surgeon: Burnell Blanks, MD;  Location: [place] [facility];  Service: Cardiovascular;  Laterality: N/A;   EYE SURGERY     INTRAVASCULAR ULTRASOUND/IVUS N/A 08/15/2020   Procedure: Intravascular Ultrasound/IVUS;  Surgeon: Jettie Booze, MD;  Location: [place] [facility];   Service: Cardiovascular;  Laterality: N/A;   LEFT HEART CATH AND CORONARY ANGIOGRAPHY N/A 08/15/2020   Procedure: LEFT HEART CATH AND CORONARY ANGIOGRAPHY;  Surgeon: Jettie Booze, MD;  Location: [place] [facility];  Service: Cardiovascular;  Laterality: N/A;   LEFT HEART CATH AND CORONARY ANGIOGRAPHY N/A 01/17/2021   Procedure: LEFT HEART CATH AND CORONARY ANGIOGRAPHY;  Surgeon: Burnell Blanks, MD;  Location: [place] [facility];  Service: Cardiovascular;  Laterality: N/A;   LUMBAR FUSION     L3-5 with rods   XI ROBOTIC ASSISTED INGUINAL HERNIA REPAIR WITH MESH     x 3 surgeries    There were no vitals filed for this visit.   Subjective Assessment - 03/02/21 1255     Subjective Pt reports he was down on floor getting harness on dog and realized he has difficulty in high kneeling position due to pull on back and decreased balance. He would like to work on his balance today.  Pt verbalized readiness to d/c after today's visit.    Pertinent History diabetes, HTN, h/o low back pain since 2006 (fell in the shower)-- has spinal cord stimulator;  coronary artery disease    Patient Stated Goals to be able to kneel and rise.    Currently in Pain? Yes    Pain Score 2  Pain Location Back    Pain Orientation Lower    Pain Descriptors / Indicators Discomfort;Dull    Aggravating Factors  walking long distances, twisting    Pain Relieving Factors sitting and resting.             Pt seen for aquatic therapy today.  Treatment took place in water 3.25-4 ft in depth at the [employer] pool. Temp of water was 93.  Pt entered/exited the pool via stairs independently with bilat rail.  Treatment:   Forward gait (without floatation support). Backwards walking.  Tandem gait forward, with cues to narrow stance (challenging).  Braiding L<>R.  SLS without UE support.  Side to side lunge for hip flexibility.   Step ups forward and lateral x 10 each direction, each leg with UE on  pool edge (water to sternum). Single leg heel raises x 10 each.  Core engaged with dumbell presses down into water (elbows bent/ elbows straight), then trunk rotation L<>R  submerging 2 blue dumbells under water.  With blue dumbells under armpits - floating with knee tucks - reg and diagonal for obliques. Attempted side swings (limited motion).  Sit to/from stand on water bench holding blue dumbbell with side step squat (pushing dumbell under water). TA set in sitting with feet lifted off of floor, working core and seated balance.  Staggered stance with lumbar rotation (Ai Chi Freeing).  Leg circles with dumbbell behind knee x 8 reps.   Pt requires buoyancy for support and to offload joints with strengthening exercises. Viscosity of the water is needed for resistance of strengthening; water current perturbations provides challenge to standing balance unsupported, requiring increased core activation.     PT Long Term Goals - 03/02/21 1307       PT LONG TERM GOAL #1   Title The patient will return demo HEP progression for flexibility, lumbar mobility, and core stability.    Time 6    Period Weeks    Status Achieved      PT LONG TERM GOAL #2   Title The patient will improve Berg balance score from 40/56 to > or equal to 46/56 to demo dec'd risk for falls.    Baseline 48/56 on 01/30/21    Time 6    Period Weeks    Status Achieved      PT LONG TERM GOAL #3   Title The patient will improve hamstring flexibility R side to -30 from full extension (90/90 supine starting position).    Baseline -25 on 01/30/21    Time 6    Period Weeks    Status Achieved      PT LONG TERM GOAL #4   Title The patient will be further assessed on Berg balance, and gait speed and goals to follow as indicated.    Baseline Berg on 4/26 was 40/56 and gait speed 2.26 ft/sec    Time 6    Period Weeks    Status Achieved      PT LONG TERM GOAL #5   Title The patient will be able to move floor<>stand with UE support  due to h/o falls.    Time 6    Period Weeks    Status Achieved             Impression statement:  Pt tolerated all aquatic exercises well.  Reviewed aquatic HEP.  Pt has met all goals and verbalizes readiness to d/c at this time.      Visit Diagnosis: Bilateral low  back pain without sciatica, unspecified chronicity  Muscle weakness (generalized)  Other abnormalities of gait and mobility     Problem List Patient Active Problem List   Diagnosis Date Noted   Unstable angina ([place])    Vertigo 11/28/2020   Myelopathy ([place]) 11/28/2020   Insomnia 09/03/2020   Status post coronary artery stent placement    Mixed hyperlipidemia    Coronary artery disease    Chest pain 08/11/2020   Facet arthropathy, lumbar 06/22/2020   Major depression in partial remission ([place]) 06/04/2020   Nausea and vomiting 04/14/2020   Hyperlipidemia associated with type 2 diabetes mellitus ([place]) 04/14/2020   Primary hypertension 04/07/2020   Type 2 diabetes mellitus with other specified complication ([place]) 88/89/1694   SBO (small bowel obstruction) ([place]) 04/06/2020   Sepsis ([place]) 10/06/2019   Status post insertion of spinal cord stimulator 05/19/2017   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   Erectile dysfunction 10/09/2015   PHYSICAL THERAPY DISCHARGE SUMMARY  Visits from Start of Care: 17  Current functional level related to goals / functional outcomes: All goals met-- see LTGs above   Remaining deficits: Patient continuing to work on chronic deficits through pool and gym program, as well as HEP.   Education / Equipment: Aquatics, HEP, walking, gym program   Patient agrees to discharge. Patient goals were met. Patient is being discharged due to meeting the stated rehab goals.  Rudell Cobb, PT  Kerin Perna, PTA 03/02/21 1:41 PM   [facility] [address] [place] [place], [place], [address] Phone: [phone]   Fax:   [phone]  Name: Brandon [name] MRN: [id number] Date of Birth: 26-Jul-1956

## 2021-03-05 ENCOUNTER — Encounter (HOSPITAL_COMMUNITY)
Admission: RE | Admit: 2021-03-05 | Discharge: 2021-03-05 | Disposition: A | Discharge status: Home / Self Care | Payer: No Typology Code available for payment source | Source: Ambulatory Visit | Attending: Cardiology | Admitting: Cardiology

## 2021-03-05 ENCOUNTER — Other Ambulatory Visit (HOSPITAL_COMMUNITY): Payer: Self-pay

## 2021-03-05 ENCOUNTER — Other Ambulatory Visit: Payer: Self-pay

## 2021-03-05 DIAGNOSIS — Z955 Presence of coronary angioplasty implant and graft: Secondary | ICD-10-CM | POA: Diagnosis not present

## 2021-03-05 NOTE — Telephone Encounter (Signed)
Patient is scheduled for BiPAP Titration on 05/15/21. Patient understands his titration study will be done at [facility] sleep lab. Patient understands he will receive a letter in a week or so detailing appointment, date, time, and location. Patient agrees with treatment and thanked me for call.

## 2021-03-06 ENCOUNTER — Other Ambulatory Visit: Payer: Self-pay | Admitting: Family Medicine

## 2021-03-06 ENCOUNTER — Other Ambulatory Visit: Payer: Self-pay

## 2021-03-06 ENCOUNTER — Ambulatory Visit (INDEPENDENT_AMBULATORY_CARE_PROVIDER_SITE_OTHER): Payer: No Typology Code available for payment source | Admitting: Family Medicine

## 2021-03-06 ENCOUNTER — Other Ambulatory Visit (HOSPITAL_COMMUNITY): Payer: Self-pay

## 2021-03-06 DIAGNOSIS — I2510 Atherosclerotic heart disease of native coronary artery without angina pectoris: Secondary | ICD-10-CM

## 2021-03-06 DIAGNOSIS — I10 Essential (primary) hypertension: Secondary | ICD-10-CM | POA: Diagnosis not present

## 2021-03-06 DIAGNOSIS — E1169 Type 2 diabetes mellitus with other specified complication: Secondary | ICD-10-CM

## 2021-03-06 DIAGNOSIS — E291 Testicular hypofunction: Secondary | ICD-10-CM

## 2021-03-06 MED FILL — Clopidogrel Bisulfate Tab 75 MG (Base Equiv): ORAL | 90 days supply | Qty: 90 | Fill #1 | Status: AC

## 2021-03-06 MED FILL — Atorvastatin Calcium Tab 80 MG (Base Equivalent): ORAL | 90 days supply | Qty: 90 | Fill #1 | Status: AC

## 2021-03-06 NOTE — Patient Instructions (Signed)
Nice to see you today! Try using biotene mouthwash for dry mouth.  If not improving with this let me know and we can try Magic Mouthwash.  Continue current medications.  See me again in 6 months.

## 2021-03-07 ENCOUNTER — Other Ambulatory Visit (HOSPITAL_COMMUNITY): Payer: Self-pay

## 2021-03-07 ENCOUNTER — Encounter (HOSPITAL_COMMUNITY): Payer: No Typology Code available for payment source

## 2021-03-07 ENCOUNTER — Other Ambulatory Visit: Payer: Self-pay | Admitting: Family Medicine

## 2021-03-07 MED ORDER — PANTOPRAZOLE SODIUM 40 MG PO TBEC
40.0000 mg | DELAYED_RELEASE_TABLET | Freq: Every day | ORAL | 0 refills | Status: DC
  Filled 2021-03-07: qty 90, 90d supply, fill #0

## 2021-03-08 ENCOUNTER — Other Ambulatory Visit: Payer: Self-pay | Admitting: Family Medicine

## 2021-03-08 ENCOUNTER — Other Ambulatory Visit (HOSPITAL_COMMUNITY): Payer: Self-pay

## 2021-03-08 ENCOUNTER — Other Ambulatory Visit: Payer: Self-pay | Admitting: Endocrinology

## 2021-03-09 ENCOUNTER — Other Ambulatory Visit: Payer: Self-pay | Admitting: Family Medicine

## 2021-03-09 ENCOUNTER — Other Ambulatory Visit: Payer: Self-pay | Admitting: Endocrinology

## 2021-03-09 ENCOUNTER — Ambulatory Visit: Payer: No Typology Code available for payment source | Admitting: Physical Therapy

## 2021-03-09 ENCOUNTER — Other Ambulatory Visit (HOSPITAL_COMMUNITY): Payer: Self-pay

## 2021-03-09 MED ORDER — AMLODIPINE BESYLATE 10 MG PO TABS
10.0000 mg | ORAL_TABLET | Freq: Every day | ORAL | 1 refills | Status: DC
  Filled 2021-03-09: qty 90, 90d supply, fill #0

## 2021-03-10 ENCOUNTER — Encounter: Payer: Self-pay | Admitting: Family Medicine

## 2021-03-10 NOTE — Assessment & Plan Note (Signed)
Managed by cardiology.  Stable at this time.  He will continue aspirin and Plavix.

## 2021-03-10 NOTE — Assessment & Plan Note (Signed)
He feels good with current dose of testosterone injection.  He will continue at current strength.  He is aware of the cardiovascular risks with testosterone therapy however would like to continue this for quality of life

## 2021-03-10 NOTE — Assessment & Plan Note (Signed)
Blood pressure is well controlled at this time.  I recommend he continue current medications for management of hypertension.  In addition to current medications are recommended they continue to work on lifestyle change including low-sodium diet and exercise.

## 2021-03-10 NOTE — Assessment & Plan Note (Signed)
Management per endocrinology at this time.  Lab Results  Component Value Date   HGBA1C 6.8 (A) 01/16/2021  Is doing pretty well with medications.  Weight is down some.  Encouraged to continue lifestyle change

## 2021-03-10 NOTE — Progress Notes (Signed)
Brandon [name] - 65 y.o. male MRN [id number]  Date of birth: [birth year]  Subjective Chief Complaint  Patient presents with   Hypertension    HPI Brandon [name] is a 65 y.o. male here today for follow-up visit.  He tells me he feels like he is doing pretty well.  He is seeing endocrinology for management of his diabetes now.  He is currently using Jardiance, Tresiba, Humalog and Ozempic.  He has been making changes to his diet as well.  His weight is down about 5 pounds over the past year.  He denies any hypoglycemic episodes with current medication.  He does have history of CAD and hypertension.  His blood pressures remain well controlled.  He has not had any anginal symptoms.  He is walking more frequently now for exercise.  He has been seeing Dr. Benjamin Stain for his chronic back pain and is doing physical therapy.  He is also doing cardiac rehab.  He does feel like this has been helpful.  He feels pretty good with current testosterone dosing.  No side effects or worsening urinary symptoms related to BPH.  ROS:  A comprehensive ROS was completed and negative except as noted per HPI  Allergies  Allergen Reactions   Kiwi Extract Swelling    Mouth swelling.    Other Swelling    EGGPLANT. Mouth swelling.   Penicillins Rash    Reaction: unknown   Ancef [Cefazolin] Rash   Latex Rash   Levaquin [Levofloxacin] Rash    Past Medical History:  Diagnosis Date   Anxiety    Basal cell carcinoma (BCC) in situ of skin    Colon polyps    Coronary artery disease    S/p DES to mRCA in 12/21 // S/p DES to LCx x 2 in 12/2020 // Diff dz in small to mod Dx (not a good target for PCI); OM1 100 CTO; severe dz in small OM2 and PDA >> Med Rx   Depression    Diabetes mellitus without complication ([facility])    HLD (hyperlipidemia)    Hypertension    SBO (small bowel obstruction) ([facility])     Past Surgical History:  Procedure Laterality Date   BACK SURGERY     CARDIAC CATHETERIZATION     CATARACT  EXTRACTION W/ INTRAOCULAR LENS  IMPLANT, BILATERAL     CHOLECYSTECTOMY     CHOLECYSTECTOMY, LAPAROSCOPIC     CORONARY STENT INTERVENTION N/A 08/15/2020   Procedure: CORONARY STENT INTERVENTION;  Surgeon: Corky Crafts, MD;  Location: [facility];  Service: Cardiovascular;  Laterality: N/A;   CORONARY STENT INTERVENTION N/A 01/17/2021   Procedure: CORONARY STENT INTERVENTION;  Surgeon: Kathleene Hazel, MD;  Location: [facility];  Service: Cardiovascular;  Laterality: N/A;   EYE SURGERY     INTRAVASCULAR ULTRASOUND/IVUS N/A 08/15/2020   Procedure: Intravascular Ultrasound/IVUS;  Surgeon: Corky Crafts, MD;  Location: [facility];  Service: Cardiovascular;  Laterality: N/A;   LEFT HEART CATH AND CORONARY ANGIOGRAPHY N/A 08/15/2020   Procedure: LEFT HEART CATH AND CORONARY ANGIOGRAPHY;  Surgeon: Corky Crafts, MD;  Location: [facility];  Service: Cardiovascular;  Laterality: N/A;   LEFT HEART CATH AND CORONARY ANGIOGRAPHY N/A 01/17/2021   Procedure: LEFT HEART CATH AND CORONARY ANGIOGRAPHY;  Surgeon: Kathleene Hazel, MD;  Location: [facility];  Service: Cardiovascular;  Laterality: N/A;   LUMBAR FUSION     L3-5 with rods   XI ROBOTIC ASSISTED INGUINAL HERNIA  REPAIR WITH MESH     x 3 surgeries    Social History   Socioeconomic History   Marital status: Married    Spouse name: Not on file   Number of children: 4   Years of education: 16   Highest education level: Not on file  Occupational History   Occupation: disabled  Tobacco Use   Smoking status: Former    Types: Cigarettes    Quit date: 2007    Years since quitting: 15.5   Smokeless tobacco: Never  Vaping Use   Vaping Use: Not on file  Substance and Sexual Activity   Alcohol use: Not Currently   Drug use: Not Currently   Sexual activity: Yes  Other Topics Concern   Not on file  Social History Narrative   Not on file   Social Determinants of Health    Financial Resource Strain: Not on file  Food Insecurity: Not on file  Transportation Needs: Not on file  Physical Activity: Not on file  Stress: Not on file  Social Connections: Not on file    Family History  Problem Relation Age of Onset   COPD Mother    Anxiety disorder Mother    Depression Mother    Cancer Father    Anxiety disorder Father    Depression Father    Other Father    Diabetes Neg Hx     Health Maintenance  Topic Date Due   OPHTHALMOLOGY EXAM  Never done   Hepatitis C Screening  Never done   TETANUS/TDAP  Never done   COLONOSCOPY (Pts 45-65yrs Insurance coverage will need to be confirmed)  Never done   Zoster Vaccines- Shingrix (1 of 2) Never done   PNA vac Low Risk Adult (1 of 2 - PCV13) 09/13/2020   COVID-19 Vaccine (3 - Booster for Pfizer series) 11/14/2020   INFLUENZA VACCINE  03/26/2021   HEMOGLOBIN A1C  07/19/2021   FOOT EXAM  01/16/2022   HIV Screening  Completed   HPV VACCINES  Aged Out     ----------------------------------------------------------------------------------------------------------------------------------------------------------------------------------------------------------------- Physical Exam BP 119/66 (BP Location: Left Arm, Patient Position: Sitting, Cuff Size: Large)   Pulse 83   Ht 5\' 9"  (1.753 m)   Wt 234 lb (106.1 kg)   SpO2 96%   BMI 34.56 kg/m   Physical Exam Constitutional:      Appearance: Normal appearance.  Eyes:     General: No scleral icterus. Cardiovascular:     Rate and Rhythm: Normal rate and regular rhythm.  Pulmonary:     Effort: Pulmonary effort is normal.     Breath sounds: Normal breath sounds.  Musculoskeletal:     Cervical back: Neck supple.  Skin:    General: Skin is warm and dry.  Neurological:     General: No focal deficit present.     Mental Status: He is alert.  Psychiatric:        Mood and Affect: Mood normal.        Behavior: Behavior normal.     ------------------------------------------------------------------------------------------------------------------------------------------------------------------------------------------------------------------- Assessment and Plan  Primary hypertension Blood pressure is well controlled at this time.  I recommend he continue current medications for management of hypertension.  In addition to current medications are recommended they continue to work on lifestyle change including low-sodium diet and exercise.  Coronary artery disease Managed by cardiology.  Stable at this time.  He will continue aspirin and Plavix.  Type 2 diabetes mellitus with other specified complication [facility]) Management per endocrinology at this time.  Lab Results  Component Value Date   HGBA1C 6.8 (A) 01/16/2021  Is doing pretty well with medications.  Weight is down some.  Encouraged to continue lifestyle change  Male hypogonadism He feels good with current dose of testosterone injection.  He will continue at current strength.  He is aware of the cardiovascular risks with testosterone therapy however would like to continue this for quality of life   No orders of the defined types were placed in this encounter.   Return in about 6 months (around 09/06/2021) for HTN.    This visit occurred during the SARS-CoV-2 public health emergency.  Safety protocols were in place, including screening questions prior to the visit, additional usage of staff PPE, and extensive cleaning of exam room while observing appropriate contact time as indicated for disinfecting solutions.

## 2021-03-11 ENCOUNTER — Other Ambulatory Visit: Payer: Self-pay | Admitting: Endocrinology

## 2021-03-11 MED ORDER — INSULIN LISPRO (1 UNIT DIAL) 100 UNIT/ML (KWIKPEN)
24.0000 [IU] | PEN_INJECTOR | Freq: Three times a day (TID) | SUBCUTANEOUS | 3 refills | Status: DC
  Filled 2021-03-11: qty 60, 84d supply, fill #0
  Filled 2021-06-01: qty 60, 84d supply, fill #1
  Filled 2021-08-29: qty 60, 84d supply, fill #2

## 2021-03-12 ENCOUNTER — Encounter (HOSPITAL_COMMUNITY): Payer: No Typology Code available for payment source

## 2021-03-12 ENCOUNTER — Other Ambulatory Visit: Payer: Self-pay | Admitting: Family Medicine

## 2021-03-12 ENCOUNTER — Other Ambulatory Visit (HOSPITAL_COMMUNITY): Payer: Self-pay

## 2021-03-13 MED ORDER — TESTOSTERONE CYPIONATE 200 MG/ML IM SOLN
200.0000 mg | INTRAMUSCULAR | 1 refills | Status: DC
  Filled 2021-03-13 – 2021-03-23 (×2): qty 5, 35d supply, fill #0
  Filled 2021-05-16: qty 5, 35d supply, fill #1

## 2021-03-14 ENCOUNTER — Other Ambulatory Visit (HOSPITAL_COMMUNITY): Payer: Self-pay

## 2021-03-14 ENCOUNTER — Encounter (HOSPITAL_COMMUNITY): Payer: No Typology Code available for payment source

## 2021-03-14 NOTE — Progress Notes (Signed)
Cardiac Individual Treatment Plan  Patient Details  Name: Brandon [name] MRN: [id number] Date of Birth: 1955-11-06 Referring Provider:   Flowsheet Row CARDIAC REHAB PHASE II ORIENTATION from 11/07/2020 in [place]  Referring Provider Freada Bergeron, MD       Initial Encounter Date:  [place] PHASE II ORIENTATION from 11/07/2020 in [place]  Date 11/07/20       Visit Diagnosis: 08/15/20 S/P DES RCA  01/17/21 S/P DES Prox Circ, DEs Mid Circ  Patient's Home Medications on Admission:  Current Outpatient Medications:    amLODipine (NORVASC) 10 MG tablet, Take 1 tablet (10 mg total) by mouth daily., Disp: 90 tablet, Rfl: 1   amLODipine (NORVASC) 10 MG tablet, Take 0.5 tablets (5 mg total) by mouth every morning., Disp: 45 tablet, Rfl: 3   ARIPiprazole (ABILIFY) 10 MG tablet, Take 10 mg by mouth every morning., Disp: , Rfl:    aspirin 81 MG EC tablet, Take 81 mg by mouth every morning., Disp: , Rfl:    atorvastatin (LIPITOR) 80 MG tablet, TAKE 1 TABLET (80 MG TOTAL) BY MOUTH DAILY., Disp: 90 tablet, Rfl: 3   Blood Glucose Monitoring Suppl (FREESTYLE LITE) w/Device KIT, USE AS DIRECTED., Disp: 1 kit, Rfl: 99   Blood Pressure Monitoring (OMRON 3 SERIES BP MONITOR) DEVI, USE AS DIRECTED., Disp: 1 each, Rfl: 0   buPROPion (WELLBUTRIN XL) 150 MG 24 hr tablet, Take 1 tablet (150 mg total) by mouth daily., Disp: 90 tablet, Rfl: 0   buPROPion (WELLBUTRIN XL) 300 MG 24 hr tablet, Take 1 tablet (300 mg total) by mouth daily., Disp: 90 tablet, Rfl: 0   clopidogrel (PLAVIX) 75 MG tablet, TAKE 1 TABLET (75 MG TOTAL) BY MOUTH DAILY WITH BREAKFAST., Disp: 90 tablet, Rfl: 3   Continuous Blood Gluc Sensor (DEXCOM G6 SENSOR) MISC, Use as directed. Change sensor every 10 days, Disp: 9 each, Rfl: 3   empagliflozin (JARDIANCE) 25 MG TABS tablet, Take 1 tablet (25 mg total) by mouth daily., Disp: 90 tablet, Rfl: 0    glucose blood test strip, USE 1 TO CHECK BLOOD GLUCOSE UP TO 5 TIMES DAILY. (Patient taking differently: USE 1 TO CHECK BLOOD GLUCOSE UP TO 5 TIMES DAILY.), Disp: 300 strip, Rfl: 12   insulin degludec (TRESIBA FLEXTOUCH) 100 UNIT/ML FlexTouch Pen, Inject 35 Units into the skin daily., Disp: 45 mL, Rfl: 3   insulin lispro (HUMALOG) 100 UNIT/ML KwikPen, Inject 24 Units into the skin 3 (three) times daily before meals., Disp: 75 mL, Rfl: 3   isosorbide mononitrate (IMDUR) 30 MG 24 hr tablet, Take 1 tablet (30 mg total) by mouth daily., Disp: 90 tablet, Rfl: 1   Lancets (FREESTYLE) lancets, USE 1 TO CHECK BLOOD GLUCOSE UP TO 5 TIMES DAILY. (Patient taking differently: USE 1 TO CHECK BLOOD GLUCOSE UP TO 5 TIMES DAILY.), Disp: 300 each, Rfl: 12   losartan (COZAAR) 25 MG tablet, Take 1 tablet (25 mg total) by mouth daily. Take only when systolic BP greater than 628., Disp: 90 tablet, Rfl: 1   metoCLOPramide (REGLAN) 10 MG tablet, Take 10 mg by mouth daily as needed for nausea or vomiting (upset stomach)., Disp: , Rfl:    metoprolol succinate (TOPROL-XL) 50 MG 24 hr tablet, Take 1 tablet (50 mg total) by mouth daily. Take with or immediately following a meal., Disp: 90 tablet, Rfl: 1   nitroGLYCERIN (NITROSTAT) 0.4 MG SL tablet, Place 1 tablet (0.4 mg  total) under the tongue every 5 (five) minutes as needed for chest pain., Disp: 60 tablet, Rfl: 0   ondansetron (ZOFRAN ODT) 4 MG disintegrating tablet, Take 1 tablet (4 mg total) by mouth every 8 (eight) hours as needed for nausea or vomiting., Disp: 20 tablet, Rfl: 0   pantoprazole (PROTONIX) 40 MG tablet, Take 1 tablet (40 mg total) by mouth daily., Disp: 90 tablet, Rfl: 0   Semaglutide,0.25 or 0.5MG/DOS, (OZEMPIC, 0.25 OR 0.5 MG/DOSE,) 2 MG/1.5ML SOPN, Inject 0.5 mg into the skin once a week., Disp: 4.5 mL, Rfl: 3   tamsulosin (FLOMAX) 0.4 MG CAPS capsule, TAKE 1 CAPSULE BY MOUTH ONCE DAILY., Disp: 90 capsule, Rfl: 0   [START ON 03/18/2021] testosterone  cypionate (DEPOTESTOSTERONE CYPIONATE) 200 MG/ML injection, Inject 1 mL (200 mg total) into the muscle every Sunday., Disp: 5 mL, Rfl: 1   tiZANidine (ZANAFLEX) 4 MG tablet, Take 2 tablets (8 mg total) by mouth 3 (three) times daily., Disp: 180 tablet, Rfl: 11   traMADol (ULTRAM) 50 MG tablet, Take 2 tablets (100 mg total) by mouth 3 (three) times daily as needed., Disp: 180 tablet, Rfl: 0   triazolam (HALCION) 0.25 MG tablet, TAKE 1-2 TABS BY MOUTH 2 HOURS BEFORE PROCEDURE OR IMAGING.DO NOT DRIVE WITH THIS MEDICATION., Disp: 2 tablet, Rfl: 0  Past Medical History: Past Medical History:  Diagnosis Date   Anxiety    Basal cell carcinoma (BCC) in situ of skin    Colon polyps    Coronary artery disease    S/p DES to mRCA in 12/21 // S/p DES to LCx x 2 in 12/2020 // Diff dz in small to mod Dx (not a good target for PCI); OM1 100 CTO; severe dz in small OM2 and PDA >> Med Rx   Depression    Diabetes mellitus without complication (HCC)    HLD (hyperlipidemia)    Hypertension    SBO (small bowel obstruction) (HCC)     Tobacco Use: Social History   Tobacco Use  Smoking Status Former   Types: Cigarettes   Quit date: 2007   Years since quitting: 15.5  Smokeless Tobacco Never    Labs: Recent Review Scientist, physiological     Labs for ITP Cardiac and Pulmonary Rehab Latest Ref Rng & Units 04/07/2020 08/13/2020 10/23/2020 11/15/2020 01/16/2021   Cholestrol 100 - 199 mg/dL - 160 113 - -   LDLCALC 0 - 99 mg/dL - 76 53 - -   HDL >39 mg/dL - 32(L) 45 - -   Trlycerides 0 - 149 mg/dL - 258(H) 74 - -   Hemoglobin A1c 4.0 - 5.6 % 7.2(H) 10.1(H) - 6.5(A) 6.8(A)       Capillary Blood Glucose: Lab Results  Component Value Date   GLUCAP 176 (H) 01/17/2021   GLUCAP 121 (H) 01/17/2021   GLUCAP 84 01/17/2021   GLUCAP 122 (H) 01/17/2021   GLUCAP 171 (H) 11/15/2020     Exercise Target Goals: Exercise Program Goal: Individual exercise prescription set using results from initial 6 min walk test and THRR  while considering  patient's activity barriers and safety.   Exercise Prescription Goal: Initial exercise prescription builds to 30-45 minutes a day of aerobic activity, 2-3 days per week.  Home exercise guidelines will be given to patient during program as part of exercise prescription that the participant will acknowledge.  Activity Barriers & Risk Stratification:  Activity Barriers & Cardiac Risk Stratification - 11/07/20 1044       Activity Barriers &  Cardiac Risk Stratification   Activity Barriers Arthritis;Back Problems;Joint Problems;Fibromyalgia;Other (comment)    Comments Back surgery in 2006 with ongoing back pain. L3-L5 fused, S1 joints arthritic and degenerative. ROM in back decreased by 40%. Has spinal cord stimulator. Hip pain with prolonged walking.    Cardiac Risk Stratification Moderate             6 Minute Walk:  6 Minute Walk     Row Name 11/07/20 1054         6 Minute Walk   Phase Initial     Distance 1064 feet     Walk Time 6 minutes     # of Rest Breaks 0     MPH 2.01     METS 2.24     RPE 13     Perceived Dyspnea  1     VO2 Peak 7.84     Symptoms Yes (comment)     Comments Patient reported 6/10 left hip pain and mild SOB, 1/4 on dyspnea scale.     Resting HR 79 bpm     Resting BP 102/60     Resting Oxygen Saturation  98 %     Exercise Oxygen Saturation  during 6 min walk 98 %     Max Ex. HR 86 bpm     Max Ex. BP 134/72     2 Minute Post BP 118/70              Oxygen Initial Assessment:   Oxygen Re-Evaluation:   Oxygen Discharge (Final Oxygen Re-Evaluation):   Initial Exercise Prescription:  Initial Exercise Prescription - 11/07/20 1100       Date of Initial Exercise RX and Referring Provider   Date 11/07/20    Referring Provider Freada Bergeron, MD    Expected Discharge Date 01/05/21      T5 Nustep   Level 2    SPM 85    Minutes 25    METs 1.8      Prescription Details   Frequency (times per week) 3    Duration  Progress to 30 minutes of continuous aerobic without signs/symptoms of physical distress      Intensity   THRR 40-80% of Max Heartrate 62-124    Ratings of Perceived Exertion 11-13    Perceived Dyspnea 0-4      Progression   Progression Continue to progress workloads to maintain intensity without signs/symptoms of physical distress.      Resistance Training   Training Prescription Yes    Weight 4 lbs    Reps 10-15             Perform Capillary Blood Glucose checks as needed.  Exercise Prescription Changes:   Exercise Prescription Changes     Row Name 11/13/20 1048 11/20/20 1044 12/08/20 1045 12/18/20 1050 01/01/21 1049     Response to Exercise   Blood Pressure (Admit) 120/60 150/76 122/76 142/64 148/60   Blood Pressure (Exercise) 122/62 128/64 118/58 128/62 166/62   Blood Pressure (Exit) 118/68 118/70 104/58 100/60 148/62   Heart Rate (Admit) 89 bpm 92 bpm 97 bpm 90 bpm 87 bpm   Heart Rate (Exercise) 89 bpm 92 bpm 94 bpm 93 bpm 93 bpm   Heart Rate (Exit) 81 bpm 85 bpm 87 bpm 86 bpm 87 bpm   Rating of Perceived Exertion (Exercise) '11 11 11 9 11   ' Symptoms Patient c/o SI joint pain, which is chronic for him. -- -- -- --  Comments Patient completed 1st session of exercise fair with some joint pain. -- -- -- --   Duration Progress to 30 minutes of  aerobic without signs/symptoms of physical distress Progress to 30 minutes of  aerobic without signs/symptoms of physical distress Progress to 30 minutes of  aerobic without signs/symptoms of physical distress Progress to 30 minutes of  aerobic without signs/symptoms of physical distress Progress to 30 minutes of  aerobic without signs/symptoms of physical distress   Intensity THRR unchanged THRR unchanged THRR unchanged THRR unchanged THRR unchanged     Progression   Progression Continue to progress workloads to maintain intensity without signs/symptoms of physical distress. Continue to progress workloads to maintain intensity  without signs/symptoms of physical distress. Continue to progress workloads to maintain intensity without signs/symptoms of physical distress. Continue to progress workloads to maintain intensity without signs/symptoms of physical distress. Continue to progress workloads to maintain intensity without signs/symptoms of physical distress.   Average METs 1.5 1.7 1.5 1.7 1.9     Resistance Training   Training Prescription Yes Yes Yes Yes Yes   Weight 4 lbs 4 lbs 4 lbs 4 lbs 4 lbs   Reps 10-15 10-15 10-15 10-15 10-15   Time 10 Minutes 10 Minutes 10 Minutes 10 Minutes 10 Minutes     Interval Training   Interval Training No No No No No     NuStep   Level '2 2 3 4 5   ' SPM 85 85 85 85 85   Minutes '25 25 26 25 25   ' METs 1.5 1.7 1.5 1.7 1.9     T5 Nustep   Level -- -- -- -- --   SPM -- -- -- -- --   Minutes -- -- -- -- --   METs -- -- -- -- --     Home Exercise Plan   Plans to continue exercise at -- -- Home (comment)  Walking Home (comment)  Walking Home (comment)  Walking   Frequency -- -- Add 4 additional days to program exercise sessions. Add 4 additional days to program exercise sessions. Add 4 additional days to program exercise sessions.   Initial Home Exercises Provided -- -- 12/08/20 12/08/20 12/08/20    Row Name 02/14/21 1051 02/28/21 1048 03/05/21 1051         Response to Exercise   Blood Pressure (Admit) 130/66 108/72 124/70     Blood Pressure (Exercise) 108/72 118/62 122/62     Blood Pressure (Exit) 118/72 102/60 120/80     Heart Rate (Admit) 92 bpm 88 bpm 92 bpm     Heart Rate (Exercise) 96 bpm 92 bpm 93 bpm     Heart Rate (Exit) 88 bpm 85 bpm 92 bpm     Rating of Perceived Exertion (Exercise) '12 11 11     ' Duration Progress to 30 minutes of  aerobic without signs/symptoms of physical distress Progress to 30 minutes of  aerobic without signs/symptoms of physical distress Progress to 30 minutes of  aerobic without signs/symptoms of physical distress     Intensity THRR  unchanged THRR unchanged THRR unchanged           Progression       Progression Continue to progress workloads to maintain intensity without signs/symptoms of physical distress. Continue to progress workloads to maintain intensity without signs/symptoms of physical distress. Continue to progress workloads to maintain intensity without signs/symptoms of physical distress.     Average METs 2 1.7 1.7  Resistance Training       Training Prescription No No Yes     Weight -- -- 5 lbs     Reps -- -- 10-15     Time -- -- 10 Minutes           Interval Training       Interval Training No No No           NuStep       Level '5 5 5     ' SPM 85 85 85     Minutes '25 25 25     ' METs 2 1.7 1.7           Home Exercise Plan       Plans to continue exercise at Home (comment)  Walking Home (comment)  Walking Home (comment)  Walking     Frequency Add 4 additional days to program exercise sessions. Add 4 additional days to program exercise sessions. Add 4 additional days to program exercise sessions.     Initial Home Exercises Provided 12/08/20 12/08/20 12/08/20             Exercise Comments:   Exercise Comments     Row Name 11/13/20 1141 12/08/20 1049 12/18/20 1139 01/16/21 0754 02/14/21 1133   Exercise Comments Patient completed 1st session of exercise fair with some joint pain. Reviewed home exercise guideline, METs, and goals with patient. Reviewed METs and goals with patient. Patient out since 01/01/21 awaiting cardiac cath scheduled for 01/17/21. Reviewed goals with patient.    [place] Name 03/14/21 1133           Exercise Comments Patient absent since 03/05/21, unable to review METs and goals with patient.                Exercise Goals and Review:   Exercise Goals     Row Name 11/07/20 1045             Exercise Goals   Increase Physical Activity Yes       Intervention Provide advice, education, support and counseling about physical activity/exercise needs.;Develop an  individualized exercise prescription for aerobic and resistive training based on initial evaluation findings, risk stratification, comorbidities and participant's personal goals.       Expected Outcomes Short Term: Attend rehab on a regular basis to increase amount of physical activity.;Long Term: Exercising regularly at least 3-5 days a week.;Long Term: Add in home exercise to make exercise part of routine and to increase amount of physical activity.       Increase Strength and Stamina Yes       Intervention Provide advice, education, support and counseling about physical activity/exercise needs.;Develop an individualized exercise prescription for aerobic and resistive training based on initial evaluation findings, risk stratification, comorbidities and participant's personal goals.       Expected Outcomes Short Term: Increase workloads from initial exercise prescription for resistance, speed, and METs.;Short Term: Perform resistance training exercises routinely during rehab and add in resistance training at home;Long Term: Improve cardiorespiratory fitness, muscular endurance and strength as measured by increased METs and functional capacity (6MWT)       Able to understand and use rate of perceived exertion (RPE) scale Yes       Intervention Provide education and explanation on how to use RPE scale       Expected Outcomes Short Term: Able to use RPE daily in rehab to express subjective intensity level;Long Term:  Able to use RPE to guide intensity level  when exercising independently       Knowledge and understanding of Target Heart Rate Range (THRR) Yes       Intervention Provide education and explanation of THRR including how the numbers were predicted and where they are located for reference       Expected Outcomes Short Term: Able to state/look up THRR;Long Term: Able to use THRR to govern intensity when exercising independently;Short Term: Able to use daily as guideline for intensity in rehab        Able to check pulse independently Yes       Intervention Provide education and demonstration on how to check pulse in carotid and radial arteries.;Review the importance of being able to check your own pulse for safety during independent exercise       Expected Outcomes Short Term: Able to explain why pulse checking is important during independent exercise;Long Term: Able to check pulse independently and accurately       Understanding of Exercise Prescription Yes       Intervention Provide education, explanation, and written materials on patient's individual exercise prescription       Expected Outcomes Short Term: Able to explain program exercise prescription;Long Term: Able to explain home exercise prescription to exercise independently                Exercise Goals Re-Evaluation :  Exercise Goals Re-Evaluation     Row Name 11/13/20 1141 12/08/20 1049 12/18/20 1139 01/16/21 0754 02/14/21 1133     Exercise Goal Re-Evaluation   Exercise Goals Review Able to understand and use rate of perceived exertion (RPE) scale;Increase Physical Activity Able to understand and use rate of perceived exertion (RPE) scale;Increase Physical Activity;Understanding of Exercise Prescription;Increase Strength and Stamina;Knowledge and understanding of Target Heart Rate Range (THRR);Able to check pulse independently Able to understand and use rate of perceived exertion (RPE) scale;Increase Physical Activity;Understanding of Exercise Prescription;Increase Strength and Stamina;Knowledge and understanding of Target Heart Rate Range (THRR);Able to check pulse independently -- Able to understand and use rate of perceived exertion (RPE) scale;Increase Physical Activity;Understanding of Exercise Prescription;Increase Strength and Stamina;Knowledge and understanding of Target Heart Rate Range (THRR);Able to check pulse independently   Comments Patient able to understand and use RPE scale appropriately. Patient plans to walk 30  minutes 4-7 days/week as his mode of home exercise. Patient is able to manually count his pulse. Patient is walking about 600 yards in 10 minutes, 2-3 days/week. Discussed increasing walking duration, and patient states he can only walk about 6 minutes continuous because of bilateral hip pain. Discussed walking 10 minutes, 3 times/day taking rest breaks as needed at least 2 days/week, and patient is amenable to this. Patient has been out since 01/01/21 awaiting cardiac catheterization. Unable to review goals at this time. Patient returned to exercise today after stent placement on 01/17/21. Patient states he feels much better since stent intervention. Patient states he has no SOB and is able to carry groceries now without any problems. Patient is water walking, 2 days/week and walking outdoors to and from the mailbox in 30 minutes. Patient is working with PT on Thursdays and Fridays, 1 day stretching in pool, 1 day stretching on land. Patient plans to go to [employer] 2 days/week as well.   Expected Outcomes Progress workloads as tolerated to help achieve personal health and fitness goals. Patient will walk 30 minutes, 4 days/ week in addition to exercise at cardiac rehab to help improve cardiorespiratory fitness. Patient will accumulate 30 minutes of  walking at least 2 days/week in addition to exercise at cardiac rehab. Will review goals upon return to cardiac rehab. Patient will continue current exercise routine to help increase strength and stamina.    [place] Name 03/14/21 1133             Exercise Goal Re-Evaluation   Comments Patient absent since 03/05/21 unable to review goals. Patient scheduled to complete the cardiac rehab program next Wednesday 03/21/21.                Discharge Exercise Prescription (Final Exercise Prescription Changes):  Exercise Prescription Changes - 03/05/21 1051       Response to Exercise   Blood Pressure (Admit) 124/70    Blood Pressure (Exercise) 122/62     Blood Pressure (Exit) 120/80    Heart Rate (Admit) 92 bpm    Heart Rate (Exercise) 93 bpm    Heart Rate (Exit) 92 bpm    Rating of Perceived Exertion (Exercise) 11    Duration Progress to 30 minutes of  aerobic without signs/symptoms of physical distress    Intensity THRR unchanged      Progression   Progression Continue to progress workloads to maintain intensity without signs/symptoms of physical distress.    Average METs 1.7      Resistance Training   Training Prescription Yes    Weight 5 lbs    Reps 10-15    Time 10 Minutes      Interval Training   Interval Training No      NuStep   Level 5    SPM 85    Minutes 25    METs 1.7      Home Exercise Plan   Plans to continue exercise at Home (comment)   Walking   Frequency Add 4 additional days to program exercise sessions.    Initial Home Exercises Provided 12/08/20             Nutrition:  Target Goals: Understanding of nutrition guidelines, daily intake of sodium <1539m, cholesterol <2072m calories 30% from fat and 7% or less from saturated fats, daily to have 5 or more servings of fruits and vegetables.  Biometrics:  Pre Biometrics - 11/07/20 1050       Pre Biometrics   Waist Circumference 46.5 inches    Hip Circumference 43.25 inches    Waist to Hip Ratio 1.08 %    Triceps Skinfold 10 mm    % Body Fat 31.2 %    Grip Strength 44.5 kg    Flexibility --   N/A due to back problems.   Single Leg Stand 5.93 seconds              Nutrition Therapy Plan and Nutrition Goals:  Nutrition Therapy & Goals - 11/24/20 1207       Nutrition Therapy   Diet TLC; carb modified    Drug/Food Interactions Statins/Certain Fruits      Personal Nutrition Goals   Nutrition Goal Pt to identify food quantities necessary to achieve weight loss of 6-24 lb at graduation from cardiac rehab.    Personal Goal #2 Pt to build a healthy plate including vegetables, fruits, whole grains, and low-fat dairy products in a heart  healthy meal plan.    Personal Goal #3 Pt to continue checking CBGs and keep WNL      Intervention Plan   Intervention Prescribe, educate and counsel regarding individualized specific dietary modifications aiming towards targeted core components such as weight, hypertension, lipid management,  diabetes, heart failure and other comorbidities.;Nutrition handout(s) given to patient.    Expected Outcomes Short Term Goal: A plan has been developed with personal nutrition goals set during dietitian appointment.;Long Term Goal: Adherence to prescribed nutrition plan.             Nutrition Assessments:  MEDIFICTS Score Key: ?70 Need to make dietary changes  40-70 Heart Healthy Diet ? 40 Therapeutic Level Cholesterol Diet   Flowsheet Row CARDIAC REHAB PHASE II EXERCISE from 11/24/2020 in [place]  Picture Your Plate Total Score on Admission 42      Picture Your Plate Scores: <71 Unhealthy dietary pattern with much room for improvement. 41-50 Dietary pattern unlikely to meet recommendations for good health and room for improvement. 51-60 More healthful dietary pattern, with some room for improvement.  >60 Healthy dietary pattern, although there may be some specific behaviors that could be improved.    Nutrition Goals Re-Evaluation:  Nutrition Goals Re-Evaluation     [place] Name 11/24/20 1208 12/15/20 1329 01/16/21 0719 03/12/21 1118       Goals   Current Weight 243 lb (110.2 kg) 241 lb 6.5 oz (109.5 kg) 241 lb 2.9 oz (109.4 kg) 230 lb 13.2 oz (104.7 kg)    Nutrition Goal Pt to identify food quantities necessary to achieve weight loss of 6-24 lb at graduation from cardiac rehab. Pt to identify food quantities necessary to achieve weight loss of 6-24 lb at graduation from cardiac rehab. Pt to identify food quantities necessary to achieve weight loss of 6-24 lb at graduation from cardiac rehab. Pt to identify food quantities necessary to achieve weight loss of  6-24 lb at graduation from cardiac rehab.    Comment -- -- -- Pt has lost 11 lbs since starting cardiac rehab    Expected Outcome Weight loss Weight loss Weight loss Weight loss         Personal Goal #2 Re-Evaluation        Personal Goal #2 Pt to build a healthy plate including vegetables, fruits, whole grains, and low-fat dairy products in a heart healthy meal plan. Pt to build a healthy plate including vegetables, fruits, whole grains, and low-fat dairy products in a heart healthy meal plan. Pt to build a healthy plate including vegetables, fruits, whole grains, and low-fat dairy products in a heart healthy meal plan. Pt to build a healthy plate including vegetables, fruits, whole grains, and low-fat dairy products in a heart healthy meal plan.         Personal Goal #3 Re-Evaluation        Personal Goal #3 Pt to continue checking CBGs and keep WNL Pt to continue checking CBGs and keep WNL Pt to continue checking CBGs and keep WNL Pt to continue checking CBGs and keep WNL            Nutrition Goals Re-Evaluation:  Nutrition Goals Re-Evaluation     [place] Name 11/24/20 1208 12/15/20 1329 01/16/21 0719 03/12/21 1118       Goals   Current Weight 243 lb (110.2 kg) 241 lb 6.5 oz (109.5 kg) 241 lb 2.9 oz (109.4 kg) 230 lb 13.2 oz (104.7 kg)    Nutrition Goal Pt to identify food quantities necessary to achieve weight loss of 6-24 lb at graduation from cardiac rehab. Pt to identify food quantities necessary to achieve weight loss of 6-24 lb at graduation from cardiac rehab. Pt to identify food quantities necessary to achieve weight loss of 6-24  lb at graduation from cardiac rehab. Pt to identify food quantities necessary to achieve weight loss of 6-24 lb at graduation from cardiac rehab.    Comment -- -- -- Pt has lost 11 lbs since starting cardiac rehab    Expected Outcome Weight loss Weight loss Weight loss Weight loss         Personal Goal #2 Re-Evaluation        Personal Goal #2 Pt to build  a healthy plate including vegetables, fruits, whole grains, and low-fat dairy products in a heart healthy meal plan. Pt to build a healthy plate including vegetables, fruits, whole grains, and low-fat dairy products in a heart healthy meal plan. Pt to build a healthy plate including vegetables, fruits, whole grains, and low-fat dairy products in a heart healthy meal plan. Pt to build a healthy plate including vegetables, fruits, whole grains, and low-fat dairy products in a heart healthy meal plan.         Personal Goal #3 Re-Evaluation        Personal Goal #3 Pt to continue checking CBGs and keep WNL Pt to continue checking CBGs and keep WNL Pt to continue checking CBGs and keep WNL Pt to continue checking CBGs and keep WNL            Nutrition Goals Discharge (Final Nutrition Goals Re-Evaluation):  Nutrition Goals Re-Evaluation - 03/12/21 1118       Goals   Current Weight 230 lb 13.2 oz (104.7 kg)    Nutrition Goal Pt to identify food quantities necessary to achieve weight loss of 6-24 lb at graduation from cardiac rehab.    Comment Pt has lost 11 lbs since starting cardiac rehab    Expected Outcome Weight loss      Personal Goal #2 Re-Evaluation   Personal Goal #2 Pt to build a healthy plate including vegetables, fruits, whole grains, and low-fat dairy products in a heart healthy meal plan.      Personal Goal #3 Re-Evaluation   Personal Goal #3 Pt to continue checking CBGs and keep WNL             Psychosocial: Target Goals: Acknowledge presence or absence of significant depression and/or stress, maximize coping skills, provide positive support system. Participant is able to verbalize types and ability to use techniques and skills needed for reducing stress and depression.  Initial Review & Psychosocial Screening:  Initial Psych Review & Screening - 03/14/21 1308       Initial Review   Source of Stress Concerns Chronic Illness;Unable to perform yard/household  activities;Retirement/disability    Comments Will continue to monitor and offer support as needed             Quality of Life Scores:  Quality of Life - 11/07/20 1141       Quality of Life   Select Quality of Life      Quality of Life Scores   Health/Function Pre 17.9 %    Socioeconomic Pre 20.33 %    Psych/Spiritual Pre 21.93 %    Family Pre 25 %    GLOBAL Pre 20.27 %            Scores of 19 and below usually indicate a poorer quality of life in these areas.  A difference of  2-3 points is a clinically meaningful difference.  A difference of 2-3 points in the total score of the Quality of Life Index has been associated with significant improvement in overall quality of  life, self-image, physical symptoms, and general health in studies assessing change in quality of life.  PHQ-9: Recent Review Flowsheet Data     Depression screen Brandon [name] 2/9 03/06/2021 03/06/2021 11/07/2020 06/02/2020   Decreased Interest 0 0 0 1   Down, Depressed, Hopeless 0 0 0 1   PHQ - 2 Score 0 0 0 2   Altered sleeping 0 - - 2   Tired, decreased energy 0 - - 2   Change in appetite 0 - - 2   Feeling bad or failure about yourself  0 - - 0   Trouble concentrating 0 - - 0   Moving slowly or fidgety/restless 0 - - 0   Suicidal thoughts 0 - - 0   PHQ-9 Score 0 - - 8   Difficult doing work/chores Not difficult at all - - Somewhat difficult      Interpretation of Total Score  Total Score Depression Severity:  1-4 = Minimal depression, 5-9 = Mild depression, 10-14 = Moderate depression, 15-19 = Moderately severe depression, 20-27 = Severe depression   Psychosocial Evaluation and Intervention:   Psychosocial Re-Evaluation:  Psychosocial Re-Evaluation     Row Name 11/20/20 1631 12/18/20 1416 01/16/21 1135 02/13/21 1513 03/14/21 1309     Psychosocial Re-Evaluation   Current issues with History of Depression;Current Stress Concerns;Current Anxiety/Panic History of Depression;Current Stress  Concerns;Current Anxiety/Panic History of Depression;Current Stress Concerns;Current Anxiety/Panic History of Depression;Current Stress Concerns;Current Anxiety/Panic History of Depression;Current Stress Concerns;Current Anxiety/Panic   Comments Brandon [name] denies having increased depression and anxiety. Quality of life questionnaire forwarded to Dr Zigmund Daniel. Brandon [name] depression is currently controlled on 2 antidepressants Brandon [name] denies having increased depression and anxiety. Brandon [name] depression is currently controlled on 2 antidepressants. Brandon [name] mentioned having some stress as adult age children are staying with him after they recenlty moved into a larger apartment. Brandon [name] denies having increased depression and anxiety. Brandon [name] depression is currently controlled on 2 antidepressants. Brandon [name] last date of exercise was on 01/01/21 unable to assess at this time Brandon [name] exercise has been on hold. Brandon [name] voiced feeling less depressed since his stent placement in May   Expected Outcomes Charan will have less anxiety and depression upon completion of phase 2 cardiac rehab. Mamadou will have less anxiety and depression upon completion of phase 2 cardiac rehab. Hodges will have less anxiety and depression upon completion of phase 2 cardiac rehab. Kashden will have less anxiety and depression upon completion of phase 2 cardiac rehab. Juddson will have less anxiety and depression upon completion of phase 2 cardiac rehab.   Interventions Relaxation education;Encouraged to attend Cardiac Rehabilitation for the exercise;Stress management education Relaxation education;Encouraged to attend Cardiac Rehabilitation for the exercise;Stress management education Relaxation education;Encouraged to attend Cardiac Rehabilitation for the exercise;Stress management education Relaxation education;Encouraged to attend Cardiac Rehabilitation for the exercise;Stress management education Relaxation education;Encouraged to attend Cardiac  Rehabilitation for the exercise;Stress management education   Continue Psychosocial Services  Follow up required by staff Follow up required by staff Follow up required by staff Follow up required by staff Follow up required by staff     Initial Review   Source of Stress Concerns Chronic Illness;Unable to perform yard/household activities;Retirement/disability Chronic Illness;Unable to perform yard/household activities;Retirement/disability Chronic Illness;Unable to perform yard/household activities;Retirement/disability Chronic Illness;Unable to perform yard/household activities;Retirement/disability Chronic Illness;Unable to perform yard/household activities;Retirement/disability   Comments Will continue to monitor and offer suport as needed Will continue to monitor and offer suport as needed Will continue to monitor and offer support as needed  Will continue to monitor and offer support as needed Will continue to monitor and offer support as needed            Psychosocial Discharge (Final Psychosocial Re-Evaluation):  Psychosocial Re-Evaluation - 03/14/21 1309       Psychosocial Re-Evaluation   Current issues with History of Depression;Current Stress Concerns;Current Anxiety/Panic    Comments Brandon [name] voiced feeling less depressed since his stent placement in May    Expected Outcomes Dora will have less anxiety and depression upon completion of phase 2 cardiac rehab.    Interventions Relaxation education;Encouraged to attend Cardiac Rehabilitation for the exercise;Stress management education    Continue Psychosocial Services  Follow up required by staff      Initial Review   Source of Stress Concerns Chronic Illness;Unable to perform yard/household activities;Retirement/disability    Comments Will continue to monitor and offer support as needed             Vocational Rehabilitation: Provide vocational rehab assistance to qualifying candidates.   Vocational Rehab Evaluation &  Intervention:  Vocational Rehab - 11/07/20 1205       Initial Vocational Rehab Evaluation & Intervention   Assessment shows need for Vocational Rehabilitation No   Brandon [name] is disabled and does not need vocational rehab at this time            Education: Education Goals: Education classes will be provided on a weekly basis, covering required topics. Participant will state understanding/return demonstration of topics presented.  Learning Barriers/Preferences:  Learning Barriers/Preferences - 11/07/20 1204       Learning Barriers/Preferences   Learning Barriers Exercise Concerns   Dizziness when standing too quick   Learning Preferences Skilled Demonstration             Education Topics: Count Your Pulse:  -Group instruction provided by verbal instruction, demonstration, patient participation and written materials to support subject.  Instructors address importance of being able to find your pulse and how to count your pulse when at home without a heart monitor.  Patients get hands on experience counting their pulse with staff help and individually.   Heart Attack, Angina, and Risk Factor Modification:  -Group instruction provided by verbal instruction, video, and written materials to support subject.  Instructors address signs and symptoms of angina and heart attacks.    Also discuss risk factors for heart disease and how to make changes to improve heart health risk factors.   Functional Fitness:  -Group instruction provided by verbal instruction, demonstration, patient participation, and written materials to support subject.  Instructors address safety measures for doing things around the house.  Discuss how to get up and down off the floor, how to pick things up properly, how to safely get out of a chair without assistance, and balance training.   Meditation and Mindfulness:  -Group instruction provided by verbal instruction, patient participation, and written materials to  support subject.  Instructor addresses importance of mindfulness and meditation practice to help reduce stress and improve awareness.  Instructor also leads participants through a meditation exercise.    Stretching for Flexibility and Mobility:  -Group instruction provided by verbal instruction, patient participation, and written materials to support subject.  Instructors lead participants through series of stretches that are designed to increase flexibility thus improving mobility.  These stretches are additional exercise for major muscle groups that are typically performed during regular warm up and cool down.   Hands Only CPR:  -Group verbal, video, and participation provides a basic  overview of AHA guidelines for community CPR. Role-play of emergencies allow participants the opportunity to practice calling for help and chest compression technique with discussion of AED use.   Hypertension: -Group verbal and written instruction that provides a basic overview of hypertension including the most recent diagnostic guidelines, risk factor reduction with self-care instructions and medication management.    Nutrition I class: Heart Healthy Eating:  -Group instruction provided by PowerPoint slides, verbal discussion, and written materials to support subject matter. The instructor gives an explanation and review of the Therapeutic Lifestyle Changes diet recommendations, which includes a discussion on lipid goals, dietary fat, sodium, fiber, plant stanol/sterol esters, sugar, and the components of a well-balanced, healthy diet.   Nutrition II class: Lifestyle Skills:  -Group instruction provided by PowerPoint slides, verbal discussion, and written materials to support subject matter. The instructor gives an explanation and review of label reading, grocery shopping for heart health, heart healthy recipe modifications, and ways to make healthier choices when eating out.   Diabetes Question & Answer:   -Group instruction provided by PowerPoint slides, verbal discussion, and written materials to support subject matter. The instructor gives an explanation and review of diabetes co-morbidities, pre- and post-prandial blood glucose goals, pre-exercise blood glucose goals, signs, symptoms, and treatment of hypoglycemia and hyperglycemia, and foot care basics.   Diabetes Blitz:  -Group instruction provided by PowerPoint slides, verbal discussion, and written materials to support subject matter. The instructor gives an explanation and review of the physiology behind type 1 and type 2 diabetes, diabetes medications and rational behind using different medications, pre- and post-prandial blood glucose recommendations and Hemoglobin A1c goals, diabetes diet, and exercise including blood glucose guidelines for exercising safely.    Portion Distortion:  -Group instruction provided by PowerPoint slides, verbal discussion, written materials, and food models to support subject matter. The instructor gives an explanation of serving size versus portion size, changes in portions sizes over the last 20 years, and what consists of a serving from each food group.   Stress Management:  -Group instruction provided by verbal instruction, video, and written materials to support subject matter.  Instructors review role of stress in heart disease and how to cope with stress positively.     Exercising on Your Own:  -Group instruction provided by verbal instruction, power point, and written materials to support subject.  Instructors discuss benefits of exercise, components of exercise, frequency and intensity of exercise, and end points for exercise.  Also discuss use of nitroglycerin and activating EMS.  Review options of places to exercise outside of rehab.  Review guidelines for sex with heart disease.   Cardiac Drugs I:  -Group instruction provided by verbal instruction and written materials to support subject.   Instructor reviews cardiac drug classes: antiplatelets, anticoagulants, beta blockers, and statins.  Instructor discusses reasons, side effects, and lifestyle considerations for each drug class.   Cardiac Drugs II:  -Group instruction provided by verbal instruction and written materials to support subject.  Instructor reviews cardiac drug classes: angiotensin converting enzyme inhibitors (ACE-I), angiotensin II receptor blockers (ARBs), nitrates, and calcium channel blockers.  Instructor discusses reasons, side effects, and lifestyle considerations for each drug class.   Anatomy and Physiology of the Circulatory System:  Group verbal and written instruction and models provide basic cardiac anatomy and physiology, with the coronary electrical and arterial systems. Review of: AMI, Angina, Valve disease, Heart Failure, Peripheral Artery Disease, Cardiac Arrhythmia, Pacemakers, and the ICD.   Other Education:  -Group or individual  verbal, written, or video instructions that support the educational goals of the cardiac rehab program.   Holiday Eating Survival Tips:  -Group instruction provided by PowerPoint slides, verbal discussion, and written materials to support subject matter. The instructor gives patients tips, tricks, and techniques to help them not only survive but enjoy the holidays despite the onslaught of food that accompanies the holidays.   Knowledge Questionnaire Score:  Knowledge Questionnaire Score - 11/07/20 1141       Knowledge Questionnaire Score   Pre Score 21/24             Core Components/Risk Factors/Patient Goals at Admission:  Personal Goals and Risk Factors at Admission - 11/07/20 1041       Core Components/Risk Factors/Patient Goals on Admission    Weight Management Yes;Obesity;Weight Loss    Intervention Weight Management/Obesity: Establish reasonable short term and long term weight goals.;Obesity: Provide education and appropriate resources to help  participant work on and attain dietary goals.    Admit Weight 241 lb 13.5 oz (109.7 kg)    Goal Weight: Short Term 235 lb (106.6 kg)    Goal Weight: Long Term 199 lb (90.3 kg)    Expected Outcomes Short Term: Continue to assess and modify interventions until short term weight is achieved;Long Term: Adherence to nutrition and physical activity/exercise program aimed toward attainment of established weight goal;Weight Loss: Understanding of general recommendations for a balanced deficit meal plan, which promotes 1-2 lb weight loss per week and includes a negative energy balance of [phone] kcal/d    Diabetes Yes    Intervention Provide education about signs/symptoms and action to take for hypo/hyperglycemia.;Provide education about proper nutrition, including hydration, and aerobic/resistive exercise prescription along with prescribed medications to achieve blood glucose in normal ranges: Fasting glucose 65-99 mg/dL    Expected Outcomes Short Term: Participant verbalizes understanding of the signs/symptoms and immediate care of hyper/hypoglycemia, proper foot care and importance of medication, aerobic/resistive exercise and nutrition plan for blood glucose control.;Long Term: Attainment of HbA1C < 7%.    Hypertension Yes    Intervention Provide education on lifestyle modifcations including regular physical activity/exercise, weight management, moderate sodium restriction and increased consumption of fresh fruit, vegetables, and low fat dairy, alcohol moderation, and smoking cessation.;Monitor prescription use compliance.    Expected Outcomes Short Term: Continued assessment and intervention until BP is < 140/63m HG in hypertensive participants. < 130/867mHG in hypertensive participants with diabetes, heart failure or chronic kidney disease.;Long Term: Maintenance of blood pressure at goal levels.    Lipids Yes    Intervention Provide education and support for participant on nutrition & aerobic/resistive  exercise along with prescribed medications to achieve LDL <7082mHDL >72m64m  Expected Outcomes Short Term: Participant states understanding of desired cholesterol values and is compliant with medications prescribed. Participant is following exercise prescription and nutrition guidelines.;Long Term: Cholesterol controlled with medications as prescribed, with individualized exercise RX and with personalized nutrition plan. Value goals: LDL < 70mg63mL > 40 mg.    Stress Yes    Intervention Offer individual and/or small group education and counseling on adjustment to heart disease, stress management and health-related lifestyle change. Teach and support self-help strategies.;Refer participants experiencing significant psychosocial distress to appropriate mental health specialists for further evaluation and treatment. When possible, include family members and significant others in education/counseling sessions.    Expected Outcomes Short Term: Participant demonstrates changes in health-related behavior, relaxation and other stress management skills, ability to obtain effective social support, and  compliance with psychotropic medications if prescribed.;Long Term: Emotional wellbeing is indicated by absence of clinically significant psychosocial distress or social isolation.             Core Components/Risk Factors/Patient Goals Review:   Goals and Risk Factor Review     Row Name 11/14/20 0818 01/16/21 1137 02/13/21 1514 03/14/21 1311       Core Components/Risk Factors/Patient Goals Review   Personal Goals Review Weight Management/Obesity;Diabetes;Hypertension;Stress;Lipids Weight Management/Obesity;Diabetes;Hypertension;Stress;Lipids Weight Management/Obesity;Diabetes;Hypertension;Stress;Lipids Weight Management/Obesity;Diabetes;Hypertension;Stress;Lipids    Review Brandon [name] started exercise on 11/13/20. Brandon [name] vital signs and CBG's were stabe Brandon [name] has been doing fair with exercise at cardiac rehab.  Vital signs and CBG's have been stable. Champion has been having angina at home and is scheduled for a cath on 01/17/21. Brandon [name] plans to return to exercise on 02/14/21 per Brandon [name] post Stent on 01/17/21. Exercise has been on hold. Brandon [name] has retruned to exercise post stent placement. Kylon has lost 5 kg. No futher reports of Angina. Vital signs and CBG's remain stable    Expected Outcomes Cloys will continue to participate in phase 2 cardiac rehab for exercise nutrition and lifestyle modification Dimas will continue to participate in phase 2 cardiac rehab for exercise nutrition and lifestyle modification when he returns to exercise at cardiac rehab Reda will continue to participate in phase 2 cardiac rehab for exercise nutrition and lifestyle modification when he returns to exercise at cardiac rehab Krew will continue to participate in phase 2 cardiac rehab for exercise nutrition and lifestyle modification when he returns to exercise at cardiac rehab             Core Components/Risk Factors/Patient Goals at Discharge (Final Review):   Goals and Risk Factor Review - 03/14/21 1311       Core Components/Risk Factors/Patient Goals Review   Personal Goals Review Weight Management/Obesity;Diabetes;Hypertension;Stress;Lipids    Review Tillmon has retruned to exercise post stent placement. Erastus has lost 5 kg. No futher reports of Angina. Vital signs and CBG's remain stable    Expected Outcomes Rykin will continue to participate in phase 2 cardiac rehab for exercise nutrition and lifestyle modification when he returns to exercise at cardiac rehab             ITP Comments:  ITP Comments     Row Name 11/07/20 1051 11/14/20 0815 11/20/20 1630 12/18/20 1411 01/16/21 1133   ITP Comments Dr Fransico Him MD, Medical Director 30 Day ITP Review. Hashim started exercise on 11/13/20 and did fair with exercise 30 Day ITP Review. Ignazio is off to a good start to exercise despite orthapedic limitations 30 Day  ITP Review. Nycholas has good particpation in cardiac rehab andf fair attendance. Linkin was out last week due to moving to a new apartment with his family 36 Day ITP Review. Audrick has good particpation in cardiac rehab. Kalab is scheduled for a heart catherization. Yolanda was supposed to complete cardiac rehab on 01/12/21. Will wait for results to determine graduation date.    [place] Name 02/13/21 1511 03/14/21 1307         ITP Comments 30 Day ITP Review. Aven's exercise has been on hold since his stent placement on 01/17/21. Alois will return to cardiac rehab on 02/14/21 30 Day ITP Review. Shelia is currently out of town. Zebulin has good participation when he attends. Ewan is scheduled to grauate form phase 2 cardiac rehab next week               Comments:  See ITP Comments

## 2021-03-16 ENCOUNTER — Ambulatory Visit: Payer: No Typology Code available for payment source | Admitting: Physical Therapy

## 2021-03-19 ENCOUNTER — Other Ambulatory Visit: Payer: Self-pay | Admitting: Family Medicine

## 2021-03-19 ENCOUNTER — Other Ambulatory Visit (HOSPITAL_COMMUNITY): Payer: Self-pay

## 2021-03-19 ENCOUNTER — Encounter (HOSPITAL_COMMUNITY): Payer: No Typology Code available for payment source

## 2021-03-19 MED ORDER — NYSTATIN 100000 UNIT/ML MT SUSP
5.0000 mL | Freq: Three times a day (TID) | OROMUCOSAL | 1 refills | Status: DC | PRN
  Filled 2021-03-19: qty 200, 14d supply, fill #0

## 2021-03-20 ENCOUNTER — Other Ambulatory Visit (HOSPITAL_COMMUNITY): Payer: Self-pay

## 2021-03-20 ENCOUNTER — Other Ambulatory Visit: Payer: Self-pay | Admitting: Family Medicine

## 2021-03-20 MED ORDER — TAMSULOSIN HCL 0.4 MG PO CAPS
0.4000 mg | ORAL_CAPSULE | Freq: Every day | ORAL | 0 refills | Status: DC
  Filled 2021-03-20: qty 90, 90d supply, fill #0

## 2021-03-20 MED ORDER — PREDNISOLONE SODIUM PHOSPHATE 15 MG/5ML PO SOLN
OROMUCOSAL | 1 refills | Status: DC
  Filled 2021-03-20: qty 200, 13d supply, fill #0
  Filled 2021-05-15: qty 200, 13d supply, fill #1

## 2021-03-21 ENCOUNTER — Other Ambulatory Visit: Payer: Self-pay

## 2021-03-21 ENCOUNTER — Encounter (HOSPITAL_COMMUNITY)
Admission: RE | Admit: 2021-03-21 | Discharge: 2021-03-21 | Disposition: A | Discharge status: Home / Self Care | Payer: No Typology Code available for payment source | Source: Ambulatory Visit | Attending: Cardiology | Admitting: Cardiology

## 2021-03-21 VITALS — BP 134/70 | HR 89 | Ht 70.0 in | Wt 229.5 lb

## 2021-03-21 DIAGNOSIS — Z955 Presence of coronary angioplasty implant and graft: Secondary | ICD-10-CM

## 2021-03-21 NOTE — Progress Notes (Signed)
Discharge Progress Report  Patient Details  [name] MRN: [id number] Date of Birth: [birth year] Referring Provider:   Flowsheet Row CARDIAC REHAB PHASE II ORIENTATION from 11/07/2020 in [place]  Referring Provider Brandon Bergeron, MD        Number of Visits: 21  Reason for Discharge:  Patient reached a stable level of exercise. Patient independent in their exercise. Patient has met program and personal goals.  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, DEs Mid Circ  ADL [facility]:   Initial Exercise Prescription:  Initial Exercise Prescription - 11/07/20 1100       Date of Initial Exercise RX and Referring Provider   Date 11/07/20    Referring Provider Brandon Bergeron, MD    Expected Discharge Date 01/05/21      T5 Nustep   Level 2    SPM 85    Minutes 25    METs 1.8      Prescription Details   Frequency (times per week) 3    Duration Progress to 30 minutes of continuous aerobic without signs/symptoms of physical distress      Intensity   THRR 40-80% of Max Heartrate 62-124    Ratings of Perceived Exertion 11-13    Perceived Dyspnea 0-4      Progression   Progression Continue to progress workloads to maintain intensity without signs/symptoms of physical distress.      Resistance Training   Training Prescription Yes    Weight 4 lbs    Reps 10-15             Discharge Exercise Prescription (Final Exercise Prescription Changes):  Exercise Prescription Changes - 03/21/21 1050       Response to Exercise   Blood Pressure (Admit) 134/70    Blood Pressure (Exercise) 150/62    Blood Pressure (Exit) 122/70    Heart Rate (Admit) 89 bpm    Heart Rate (Exercise) 96 bpm    Heart Rate (Exit) 88 bpm    Rating of  Perceived Exertion (Exercise) 11    Duration Progress to 30 minutes of  aerobic without signs/symptoms of physical distress    Intensity THRR unchanged      Progression   Progression Continue to progress workloads to maintain intensity without signs/symptoms of physical distress.    Average METs 2.4      Resistance Training   Training Prescription No      Interval Training   Interval Training No      NuStep   Level 5    SPM 85    Minutes 25    METs 1.9      Track   Laps 6.5   1332 feet   Minutes 6   Walk Test   METs 2.93      Home Exercise Plan   Plans to continue exercise at Home (comment)   Walking   Frequency Add 4 additional days to program exercise sessions.    Initial Home Exercises Provided 12/08/20             Functional Capacity:  6 Minute Walk     [name] 11/07/20 1054 03/21/21 1052       6 Minute Walk   Phase Initial  Discharge    Distance 1064 feet 1332 feet    Distance % Change -- 25.19 %    Distance Feet Change -- 268 ft    Walk Time 6 minutes 6 minutes    # of Rest Breaks 0 0    MPH 2.01 2.52    METS 2.24 3.03    RPE 13 11    Perceived Dyspnea  1 0    VO2 Peak 7.84 10.61    Symptoms Yes (comment) No    Comments Patient reported 6/10 left hip pain and mild SOB, 1/4 on dyspnea scale. --    Resting HR 79 bpm 89 bpm    Resting BP 102/60 134/70    Resting Oxygen Saturation  98 % --    Exercise Oxygen Saturation  during 6 min walk 98 % 97 %    Max Ex. HR 86 bpm 96 bpm    Max Ex. BP 134/72 150/62    2 Minute Post BP 118/70 122/70             Psychological, QOL, Others - Outcomes: PHQ 2/9: Depression screen [facility] 2/9 03/21/2021 03/06/2021 03/06/2021 11/07/2020 06/02/2020  Decreased Interest 0 0 0 0 1  Down, Depressed, Hopeless 0 0 0 0 1  PHQ - 2 Score 0 0 0 0 2  Altered sleeping - 0 - - 2  Tired, decreased energy - 0 - - 2  Change in appetite - 0 - - 2  Feeling bad or failure about yourself  - 0 - - 0  Trouble concentrating - 0 - - 0   Moving slowly or fidgety/restless - 0 - - 0  Suicidal thoughts - 0 - - 0  PHQ-9 Score - 0 - - 8  Difficult doing work/chores - Not difficult at all - - Somewhat difficult    Quality of Life:  Quality of Life - 11/07/20 1141       Quality of Life   Select Quality of Life      Quality of Life Scores   Health/Function Pre 17.9 %    Socioeconomic Pre 20.33 %    Psych/Spiritual Pre 21.93 %    Family Pre 25 %    GLOBAL Pre 20.27 %             Personal Goals: Goals established at orientation with interventions provided to work toward goal.  Personal Goals and Risk Factors at Admission - 11/07/20 1041       Core Components/Risk Factors/Patient Goals on Admission    Weight Management Yes;Obesity;Weight Loss    Intervention Weight Management/Obesity: Establish reasonable short term and long term weight goals.;Obesity: Provide education and appropriate resources to help participant work on and attain dietary goals.    Admit Weight 241 lb 13.5 oz (109.7 kg)    Goal Weight: Short Term 235 lb (106.6 kg)    Goal Weight: Long Term 199 lb (90.3 kg)    Expected Outcomes Short Term: Continue to assess and modify interventions until short term weight is achieved;Long Term: Adherence to nutrition and physical activity/exercise program aimed toward attainment of established weight goal;Weight Loss: Understanding of general recommendations for a balanced deficit meal plan, which promotes 1-2 lb weight loss per week and includes a negative energy balance of [phone] kcal/d    Diabetes Yes    Intervention Provide education about signs/symptoms and action to take for hypo/hyperglycemia.;Provide education about proper nutrition, including hydration, and aerobic/resistive exercise prescription along with prescribed medications to achieve blood glucose  in normal ranges: Fasting glucose 65-99 mg/dL    Expected Outcomes Short Term: Participant verbalizes understanding of the signs/symptoms and immediate  care of hyper/hypoglycemia, proper foot care and importance of medication, aerobic/resistive exercise and nutrition plan for blood glucose control.;Long Term: Attainment of HbA1C < 7%.    Hypertension Yes    Intervention Provide education on lifestyle modifcations including regular physical activity/exercise, weight management, moderate sodium restriction and increased consumption of fresh fruit, vegetables, and low fat dairy, alcohol moderation, and smoking cessation.;Monitor prescription use compliance.    Expected Outcomes Short Term: Continued assessment and intervention until BP is < 140/54m HG in hypertensive participants. < 130/872mHG in hypertensive participants with diabetes, heart failure or chronic kidney disease.;Long Term: Maintenance of blood pressure at goal levels.    Lipids Yes    Intervention Provide education and support for participant on nutrition & aerobic/resistive exercise along with prescribed medications to achieve LDL <7034mHDL >73m82m  Expected Outcomes Short Term: Participant states understanding of desired cholesterol values and is compliant with medications prescribed. Participant is following exercise prescription and nutrition guidelines.;Long Term: Cholesterol controlled with medications as prescribed, with individualized exercise RX and with personalized nutrition plan. Value goals: LDL < 70mg62mL > 40 mg.    Stress Yes    Intervention Offer individual and/or small group education and counseling on adjustment to heart disease, stress management and health-related lifestyle change. Teach and support self-help strategies.;Refer participants experiencing significant psychosocial distress to appropriate mental health specialists for further evaluation and treatment. When possible, include family members and significant others in education/counseling sessions.    Expected Outcomes Short Term: Participant demonstrates changes in health-related behavior, relaxation and other  stress management skills, ability to obtain effective social support, and compliance with psychotropic medications if prescribed.;Long Term: Emotional wellbeing is indicated by absence of clinically significant psychosocial distress or social isolation.              Personal Goals Discharge:  Goals and Risk Factor Review     [name] 11/14/20 0818 02/13/21 1514 03/14/21 1311 03/22/21 1203       Core Components/Risk Factors/Patient Goals Review   Personal Goals Review Weight Management/Obesity;Diabetes;Hypertension;Stress;Lipids Weight Management/Obesity;Diabetes;Hypertension;Stress;Lipids Weight Management/Obesity;Diabetes;Hypertension;Stress;Lipids Weight Management/Obesity;Diabetes;Hypertension;Stress;Lipids    Review Brandon [name] exercise on 11/13/20. Brandon [name]'s vital signs and CBG's were stabe Brandon [name] to return to exercise on 02[facility]14/21 [name] Exercise has been on hold. Brandon [name] to exercise post stent placement. Brandon [name] 5 kg. No futher reports of Angina. Vital signs and CBG's remain stable Brandon [name] exercise at cardiac rehab on 03/21/21. Brandon [name] 10 pounds while partcipating in the pogram and reports feeling better.    Expected Outcomes Brandon [name] continue to participate in phase 2 cardiac rehab for exercise nutrition and lifestyle modification Brandon [name] continue to participate in phase 2 cardiac rehab for exercise nutrition and lifestyle modification when he returns to exercise at cardiac rehab Brandon [name] continue to participate in phase 2 cardiac rehab for exercise nutrition and lifestyle modification when he returns to exercise at cardiac rehab Brandon [name] continue to  exercise nutrition and lifestyle modifications upon completion of phase 2 cardiac rehab.             Exercise Goals and Review:  Exercise Goals     [name] 11/07/20 1045             Exercise Goals   Increase Physical Activity Yes  Intervention  Provide advice, education, support and counseling about physical activity/exercise needs.;Develop an individualized exercise prescription for aerobic and resistive training based on initial evaluation findings, risk stratification, comorbidities and participant's personal goals.       Expected Outcomes Short Term: Attend rehab on a regular basis to increase amount of physical activity.;Long Term: Exercising regularly at least 3-5 days a week.;Long Term: Add in home exercise to make exercise part of routine and to increase amount of physical activity.       Increase Strength and Stamina Yes       Intervention Provide advice, education, support and counseling about physical activity/exercise needs.;Develop an individualized exercise prescription for aerobic and resistive training based on initial evaluation findings, risk stratification, comorbidities and participant's personal goals.       Expected Outcomes Short Term: Increase workloads from initial exercise prescription for resistance, speed, and METs.;Short Term: Perform resistance training exercises routinely during rehab and add in resistance training at home;Long Term: Improve cardiorespiratory fitness, muscular endurance and strength as measured by increased METs and functional capacity (6MWT)       Able to understand and use rate of perceived exertion (RPE) scale Yes       Intervention Provide education and explanation on how to use RPE scale       Expected Outcomes Short Term: Able to use RPE daily in rehab to express subjective intensity level;Long Term:  Able to use RPE to guide intensity level when exercising independently       Knowledge and understanding of Target Heart Rate Range (THRR) Yes       Intervention Provide education and explanation of THRR including how the numbers were predicted and where they are located for reference       Expected Outcomes Short Term: Able to state/look up THRR;Long Term: Able to use THRR to govern intensity  when exercising independently;Short Term: Able to use daily as guideline for intensity in rehab       Able to check pulse independently Yes       Intervention Provide education and demonstration on how to check pulse in carotid and radial arteries.;Review the importance of being able to check your own pulse for safety during independent exercise       Expected Outcomes Short Term: Able to explain why pulse checking is important during independent exercise;Long Term: Able to check pulse independently and accurately       Understanding of Exercise Prescription Yes       Intervention Provide education, explanation, and written materials on patient's individual exercise prescription       Expected Outcomes Short Term: Able to explain program exercise prescription;Long Term: Able to explain home exercise prescription to exercise independently                Exercise Goals Re-Evaluation:  Exercise Goals Re-Evaluation     [name] 11/13/20 1141 12/08/20 1049 12/18/20 1139 01/16/21 0754 02/14/21 [name]     Exercise Goal Re-Evaluation   Exercise Goals Review Able to understand and use rate of perceived exertion (RPE) scale;Increase Physical Activity Able to understand and use rate of perceived exertion (RPE) scale;Increase Physical Activity;Understanding of Exercise Prescription;Increase Strength and Stamina;Knowledge and understanding of Target Heart Rate Range (THRR);Able to check pulse independently Able to understand and use rate of perceived exertion (RPE) scale;Increase Physical Activity;Understanding of Exercise Prescription;Increase Strength and Stamina;Knowledge and understanding of Target Heart Rate Range (THRR);Able to check pulse independently -- Able to understand and use rate of perceived  exertion (RPE) scale;Increase Physical Activity;Understanding of Exercise Prescription;Increase Strength and Stamina;Knowledge and understanding of Target Heart Rate Range (THRR);Able to check pulse  independently   Comments Patient able to understand and use RPE scale appropriately. Patient plans to walk 30 minutes 4-7 days/week as his mode of home exercise. Patient is able to manually count his pulse. Patient is walking about 600 yards in 10 minutes, 2-3 days/week. Discussed increasing walking duration, and patient states he can only walk about 6 minutes continuous because of bilateral hip pain. Discussed walking 10 minutes, 3 times/day taking rest breaks as needed at least 2 days/week, and patient is amenable to this. Patient has been out since 01/01/21 awaiting cardiac catheterization. Unable to review goals at this time. Patient returned to exercise today after stent placement on 01/17/21. Patient states he feels much better since stent intervention. Patient states he has no SOB and is able to carry groceries now without any problems. Patient is water walking, 2 days/week and walking outdoors to and from the mailbox in 30 minutes. Patient is working with PT on Thursdays and Fridays, 1 day stretching in pool, 1 day stretching on land. Patient plans to go to [employer] 2 days/week as well.   Expected Outcomes Progress workloads as tolerated to help achieve personal health and fitness goals. Patient will walk 30 minutes, 4 days/ week in addition to exercise at cardiac rehab to help improve cardiorespiratory fitness. Patient will accumulate 30 minutes of walking at least 2 days/week in addition to exercise at cardiac rehab. Will review goals upon return to cardiac rehab. Patient will continue current exercise routine to help increase strength and stamina.    Brandon [name] 03/21/21 1112           Exercise Goal Re-Evaluation   Exercise Goals Review -- Able to understand and use rate of perceived exertion (RPE) scale;Increase Physical Activity;Understanding of Exercise Prescription;Increase Strength and Stamina;Knowledge and understanding of Target Heart Rate Range (THRR);Able to check pulse  independently      Comments Patient absent since 03/05/21 unable to review goals. Patient scheduled to complete the cardiac rehab program next Wednesday 03/21/21. Patient completed the cardiac rehab program and made good progress. Patient's functional capacity increased 25% as measured by 6MWT. Patient states that he feels stronger, his endurance has increased and his blood sugar is in better control. Patient has lost 12 lbs since starting the program. Patient will continue his exercise by walking in the pool 2 days/week, walking on ground 1 day/week and sometimes on the days he pool walks, and exercising at [employer] center 3 days/week.      Expected Outcomes -- Patient will continue to exercise at least 30 minutes 6-7 days/week to maintain health and fitness gains.               Nutrition & Weight - Outcomes:  Pre Biometrics - 11/07/20 1050       Pre Biometrics   Waist Circumference 46.5 inches    Hip Circumference 43.25 inches    Waist to Hip Ratio 1.08 %    Triceps Skinfold 10 mm    % Body Fat 31.2 %    Grip Strength 44.5 kg    Flexibility --   N/A due to back problems.   Single Leg Stand 5.93 seconds             Post Biometrics - 03/21/21 1049        Post  Biometrics   Height 5' 10" (1.778  m)    Waist Circumference 44.5 inches    Hip Circumference 41.25 inches    Waist to Hip Ratio 1.08 %    BMI (Calculated) 32.93    Triceps Skinfold 9 mm    % Body Fat 29.2 %    Grip Strength 41.5 kg   Different dynamometer used   Flexibility --   Not performed, low back issues   Single Leg Stand 4.18 seconds             Nutrition:  Nutrition Therapy & Goals - 11/24/20 1207       Nutrition Therapy   Diet TLC; carb modified    Drug/Food Interactions Statins/Certain Fruits      Personal Nutrition Goals   Nutrition Goal Pt to identify food quantities necessary to achieve weight loss of 6-24 lb at graduation from cardiac rehab.    Personal Goal #2 Pt to build a  healthy plate including vegetables, fruits, whole grains, and low-fat dairy products in a heart healthy meal plan.    Personal Goal #3 Pt to continue checking CBGs and keep WNL      Intervention Plan   Intervention Prescribe, educate and counsel regarding individualized specific dietary modifications aiming towards targeted core components such as weight, hypertension, lipid management, diabetes, heart failure and other comorbidities.;Nutrition handout(s) given to patient.    Expected Outcomes Short Term Goal: A plan has been developed with personal nutrition goals set during dietitian appointment.;Long Term Goal: Adherence to prescribed nutrition plan.             Nutrition Discharge:   Education Questionnaire Score:  Knowledge Questionnaire Score - 11/07/20 1141       Knowledge Questionnaire Score   Pre Score 21/24             Goals reviewed with patient; copy given to patient.Pt graduated from cardiac rehab program on 03/21/21 with completion of 21 exercise sessions in Phase II. Pt maintained good attendance and progressed nicely during his participation in rehab as evidenced by increased MET level.  Jovonta increased his distance on his post exercise walk test by 268 feet and lost 4.9 kg!  Medication list reconciled. Repeat  PHQ score-  0.  Pt has made significant lifestyle changes and should be commended for his success. Pt feels he has achieved his goals during cardiac rehab.   Pt plans to continue exercise at the [facility]. Kirubel says he is working with a Physiological scientist and also swims on Friday's. We are proud of Dammon's progress and glad that he is feeling better!Barnet Pall, RN,BSN 03/22/2021 12:12 PM

## 2021-03-22 ENCOUNTER — Ambulatory Visit (INDEPENDENT_AMBULATORY_CARE_PROVIDER_SITE_OTHER): Payer: No Typology Code available for payment source | Admitting: Endocrinology

## 2021-03-22 ENCOUNTER — Other Ambulatory Visit (HOSPITAL_COMMUNITY): Payer: Self-pay

## 2021-03-22 VITALS — BP 122/64 | HR 84 | Ht 70.0 in | Wt 230.6 lb

## 2021-03-22 DIAGNOSIS — I25110 Atherosclerotic heart disease of native coronary artery with unstable angina pectoris: Secondary | ICD-10-CM

## 2021-03-22 DIAGNOSIS — E1169 Type 2 diabetes mellitus with other specified complication: Secondary | ICD-10-CM

## 2021-03-22 LAB — POCT GLYCOSYLATED HEMOGLOBIN (HGB A1C): Hemoglobin A1C: 6.3 % — AB (ref 4.0–5.6)

## 2021-03-22 MED ORDER — TRESIBA FLEXTOUCH 100 UNIT/ML ~~LOC~~ SOPN
15.0000 [IU] | PEN_INJECTOR | Freq: Every day | SUBCUTANEOUS | 3 refills | Status: DC
  Filled 2021-03-22: qty 12, 80d supply, fill #0

## 2021-03-22 MED ORDER — SEMAGLUTIDE (1 MG/DOSE) 4 MG/3ML ~~LOC~~ SOPN
1.0000 mg | PEN_INJECTOR | SUBCUTANEOUS | 3 refills | Status: DC
  Filled 2021-03-22: qty 9, 84d supply, fill #0

## 2021-03-22 NOTE — Patient Instructions (Addendum)
check your blood sugar twice a day.  vary the time of day when you check, between before the 3 meals, and at bedtime.  also check if you have symptoms of your blood sugar being too high or too low.  please keep a record of the readings and bring it to your next appointment here (or you can bring the meter itself).  You can write it on any piece of paper.  please call us sooner if your blood sugar goes below 70, or if most of your readings are over 200.  We will need to take this complex situation in stages.   I have sent a prescription to your pharmacy, to double the Ozempic, and:   Reduce the Tresiba to 15 units per day, and:  continue the same Humalog: 1 unit/3.5 grams, and:  Please continue the same Jardiance.  Please come back for a follow-up appointment in 2 months.

## 2021-03-22 NOTE — Progress Notes (Signed)
Subjective:    Patient ID: Brandon [name], male    DOB: 01-Mar-1956, 65 y.o.   MRN: [id number]  HPI Pt returns for f/u of diabetes mellitus: DM type:Insulin-requiring type 2  Dx'ed: 7948 Complications: CAD Therapy: insulin since 2012 DKA: never Severe hypoglycemia: never Pancreatitis: never Pancreatic imaging: normal on 2021 CT SDOH: none Other: he takes multiple daily injections Interval history: He has mild hypoglycemia approx twice per week.  This happens at any time of day.  pt states he feels well in general.  I reviewed continuous glucose monitor data.  Glucose varies from 80-210.  It is in general lowest 1AM-8AM   Past Medical History:  Diagnosis Date   Anxiety    Basal cell carcinoma (BCC) in situ of skin    Colon polyps    Coronary artery disease    S/p DES to mRCA in 12/21 // S/p DES to LCx x 2 in 12/2020 // Diff dz in small to mod Dx (not a good target for PCI); OM1 100 CTO; severe dz in small OM2 and PDA >> Med Rx   Depression    Diabetes mellitus without complication ([place])    HLD (hyperlipidemia)    Hypertension    SBO (small bowel obstruction) ([place])     Past Surgical History:  Procedure Laterality Date   BACK SURGERY     CARDIAC CATHETERIZATION     CATARACT EXTRACTION W/ INTRAOCULAR LENS  IMPLANT, BILATERAL     CHOLECYSTECTOMY     CHOLECYSTECTOMY, LAPAROSCOPIC     CORONARY STENT INTERVENTION N/A 08/15/2020   Procedure: CORONARY STENT INTERVENTION;  Surgeon: Jettie Booze, MD;  Location: [facility];  Service: Cardiovascular;  Laterality: N/A;   CORONARY STENT INTERVENTION N/A 01/17/2021   Procedure: CORONARY STENT INTERVENTION;  Surgeon: Burnell Blanks, MD;  Location: [facility];  Service: Cardiovascular;  Laterality: N/A;   EYE SURGERY     INTRAVASCULAR ULTRASOUND/IVUS N/A 08/15/2020   Procedure: Intravascular Ultrasound/IVUS;  Surgeon: Jettie Booze, MD;  Location: [facility];  Service: Cardiovascular;   Laterality: N/A;   LEFT HEART CATH AND CORONARY ANGIOGRAPHY N/A 08/15/2020   Procedure: LEFT HEART CATH AND CORONARY ANGIOGRAPHY;  Surgeon: Jettie Booze, MD;  Location: [facility];  Service: Cardiovascular;  Laterality: N/A;   LEFT HEART CATH AND CORONARY ANGIOGRAPHY N/A 01/17/2021   Procedure: LEFT HEART CATH AND CORONARY ANGIOGRAPHY;  Surgeon: Burnell Blanks, MD;  Location: [facility];  Service: Cardiovascular;  Laterality: N/A;   LUMBAR FUSION     L3-5 with rods   XI ROBOTIC ASSISTED INGUINAL HERNIA REPAIR WITH MESH     x 3 surgeries    Social History   Socioeconomic History   Marital status: Married    Spouse name: Not on file   Number of children: 4   Years of education: 16   Highest education level: Not on file  Occupational History   Occupation: disabled  Tobacco Use   Smoking status: Former    Types: Cigarettes    Quit date: 2007    Years since quitting: 15.5   Smokeless tobacco: Never  Vaping Use   Vaping Use: Not on file  Substance and Sexual Activity   Alcohol use: Not Currently   Drug use: Not Currently   Sexual activity: Yes  Other Topics Concern   Not on file  Social History Narrative   Not on file   Social Determinants of Health  Financial Resource Strain: Not on file  Food Insecurity: Not on file  Transportation Needs: Not on file  Physical Activity: Not on file  Stress: Not on file  Social Connections: Not on file  Intimate Partner Violence: Not on file    Current Outpatient Medications on File Prior to Visit  Medication Sig Dispense Refill   amLODipine (NORVASC) 10 MG tablet Take 0.5 tablets (5 mg total) by mouth every morning. 45 tablet 3   ARIPiprazole (ABILIFY) 10 MG tablet Take 10 mg by mouth every morning.     aspirin 81 MG EC tablet Take 81 mg by mouth every morning.     atorvastatin (LIPITOR) 80 MG tablet TAKE 1 TABLET (80 MG TOTAL) BY MOUTH DAILY. 90 tablet 3   Blood Glucose Monitoring Suppl (FREESTYLE  LITE) w/Device KIT USE AS DIRECTED. 1 kit 99   Blood Pressure Monitoring (OMRON 3 SERIES BP MONITOR) DEVI USE AS DIRECTED. 1 each 0   buPROPion (WELLBUTRIN XL) 150 MG 24 hr tablet Take 1 tablet (150 mg total) by mouth daily. 90 tablet 0   buPROPion (WELLBUTRIN XL) 300 MG 24 hr tablet Take 1 tablet (300 mg total) by mouth daily. 90 tablet 0   clopidogrel (PLAVIX) 75 MG tablet TAKE 1 TABLET (75 MG TOTAL) BY MOUTH DAILY WITH BREAKFAST. 90 tablet 3   Continuous Blood Gluc Sensor (DEXCOM G6 SENSOR) MISC Use as directed. Change sensor every 10 days 9 each 3   empagliflozin (JARDIANCE) 25 MG TABS tablet Take 1 tablet (25 mg total) by mouth daily. 90 tablet 0   glucose blood test strip USE 1 TO CHECK BLOOD GLUCOSE UP TO 5 TIMES DAILY. (Patient taking differently: USE 1 TO CHECK BLOOD GLUCOSE UP TO 5 TIMES DAILY.) 300 strip 12   insulin lispro (HUMALOG) 100 UNIT/ML KwikPen Inject 24 Units into the skin 3 (three) times daily before meals. 75 mL 3   isosorbide mononitrate (IMDUR) 30 MG 24 hr tablet Take 1 tablet (30 mg total) by mouth daily. 90 tablet 1   Lancets (FREESTYLE) lancets USE 1 TO CHECK BLOOD GLUCOSE UP TO 5 TIMES DAILY. (Patient taking differently: USE 1 TO CHECK BLOOD GLUCOSE UP TO 5 TIMES DAILY.) 300 each 12   losartan (COZAAR) 25 MG tablet Take 1 tablet (25 mg total) by mouth daily. Take only when systolic BP greater than 149. 90 tablet 1   metoCLOPramide (REGLAN) 10 MG tablet Take 10 mg by mouth daily as needed for nausea or vomiting (upset stomach).     metoprolol succinate (TOPROL-XL) 50 MG 24 hr tablet Take 1 tablet (50 mg total) by mouth daily. Take with or immediately following a meal. 90 tablet 1   nitroGLYCERIN (NITROSTAT) 0.4 MG SL tablet Place 1 tablet (0.4 mg total) under the tongue every 5 (five) minutes as needed for chest pain. 60 tablet 0   ondansetron (ZOFRAN ODT) 4 MG disintegrating tablet Take 1 tablet (4 mg total) by mouth every 8 (eight) hours as needed for nausea or vomiting.  20 tablet 0   pantoprazole (PROTONIX) 40 MG tablet Take 1 tablet (40 mg total) by mouth daily. 90 tablet 0   prednisoLONE-lidocaine-nystatin-diphenhydrAMINE-alum & mag hydroxide-simeth Swish and spit 58ms 3 times daily as needed for mouth pain. 200 mL 1   tamsulosin (FLOMAX) 0.4 MG CAPS capsule Take 1 capsule (0.4 mg total) by mouth daily. 90 capsule 0   testosterone cypionate (DEPOTESTOSTERONE CYPIONATE) 200 MG/ML injection Inject 1 mL (200 mg total) into the muscle every Sunday.  5 mL 1   tiZANidine (ZANAFLEX) 4 MG tablet Take 2 tablets (8 mg total) by mouth 3 (three) times daily. 180 tablet 11   traMADol (ULTRAM) 50 MG tablet Take 2 tablets (100 mg total) by mouth 3 (three) times daily as needed. 180 tablet 0   triazolam (HALCION) 0.25 MG tablet TAKE 1-2 TABS BY MOUTH 2 HOURS BEFORE PROCEDURE OR IMAGING.DO NOT DRIVE WITH THIS MEDICATION. 2 tablet 0   No current facility-administered medications on file prior to visit.    Allergies  Allergen Reactions   Kiwi Extract Swelling    Mouth swelling.    Other Swelling    EGGPLANT. Mouth swelling.   Penicillins Rash    Reaction: unknown   Ancef [Cefazolin] Rash   Latex Rash   Levaquin [Levofloxacin] Rash    Family History  Problem Relation Age of Onset   COPD Mother    Anxiety disorder Mother    Depression Mother    Cancer Father    Anxiety disorder Father    Depression Father    Other Father    Diabetes Neg Hx     BP 122/64 (BP Location: Right Arm, Patient Position: Sitting, Cuff Size: Large)   Pulse 84   Ht '5\' 10"'  (1.778 m)   Wt 230 lb 9.6 oz (104.6 kg)   SpO2 96%   BMI 33.09 kg/m    Review of Systems He has lost 12 lbs since last ov.  Denies n/v.      Objective:   Physical Exam Pulses: dorsalis pedis intact bilat.   MSK: no deformity of the feet CV: trace bilat leg edema Skin:  no ulcer on the feet.  normal color and temp on the feet. Neuro: sensation is intact to touch on the feet   Lab Results  Component  Value Date   HGBA1C 6.3 (A) 03/22/2021      Assessment & Plan:  Insulin-requiring type 2 DM: overcontrolled.  We'll favor GLP rx over insulin.   Patient Instructions  check your blood sugar twice a day.  vary the time of day when you check, between before the 3 meals, and at bedtime.  also check if you have symptoms of your blood sugar being too high or too low.  please keep a record of the readings and bring it to your next appointment here (or you can bring the meter itself).  You can write it on any piece of paper.  please call us sooner if your blood sugar goes below 70, or if most of your readings are over 200.  We will need to take this complex situation in stages.   I have sent a prescription to your pharmacy, to double the Ozempic, and:   Reduce the Tresiba to 15 units per day, and:  continue the same Humalog: 1 unit/3.5 grams, and:  Please continue the same Jardiance.  Please come back for a follow-up appointment in 2 months.

## 2021-03-23 ENCOUNTER — Ambulatory Visit: Payer: No Typology Code available for payment source | Admitting: Physical Therapy

## 2021-03-23 ENCOUNTER — Other Ambulatory Visit (HOSPITAL_COMMUNITY): Payer: Self-pay

## 2021-03-26 ENCOUNTER — Other Ambulatory Visit (HOSPITAL_COMMUNITY): Payer: Self-pay

## 2021-04-03 ENCOUNTER — Ambulatory Visit (INDEPENDENT_AMBULATORY_CARE_PROVIDER_SITE_OTHER): Payer: No Typology Code available for payment source

## 2021-04-03 ENCOUNTER — Ambulatory Visit (INDEPENDENT_AMBULATORY_CARE_PROVIDER_SITE_OTHER): Payer: No Typology Code available for payment source | Admitting: Sports Medicine

## 2021-04-03 ENCOUNTER — Other Ambulatory Visit: Payer: Self-pay

## 2021-04-03 DIAGNOSIS — M25512 Pain in left shoulder: Secondary | ICD-10-CM

## 2021-04-03 DIAGNOSIS — I25110 Atherosclerotic heart disease of native coronary artery with unstable angina pectoris: Secondary | ICD-10-CM

## 2021-04-03 NOTE — Progress Notes (Signed)
    Procedures performed today:    Procedure: Real-time Ultrasound Guided injection of the left subacromial bursa Device: Samsung HS60  Verbal informed consent obtained.  Time-out conducted.  Noted no overlying erythema, induration, or other signs of local infection.  Skin prepped in a sterile fashion.  Local anesthesia: Topical Ethyl chloride.  With sterile technique and under real time ultrasound guidance: noted intact rotator cuff, 1 cc Kenalog 40, 1 cc lidocaine, 1 cc bupivacaine injected easily Completed without difficulty  Advised to call if fevers/chills, erythema, induration, drainage, or persistent bleeding.  Images permanently stored and available for review in PACS.  Impression: Technically successful ultrasound guided injection.  Independent interpretation of notes and tests performed by another provider:   None.  Brief History, Exam, Impression, and Recommendations:    Acute pain of left shoulder Sky is a pleasant 65 year old male, he has over a week of pain in his left shoulder over the deltoid, worse with abduction. His exam is for the most part benign but he does have a minimally positive Neer sign. Pain is waking him from sleep. Because of this we performed a subacromial injection today. I would like x-rays, muscle could add some rotator cuff conditioning exercises. Return to see me in 6 weeks.  Of note he did endorse a sensation of painful skin in the periscapular region and over the trapezius, has not had shingles vaccinations, I did advise him to be cognizant of any developing rash or malaise in which case we would start Valtrex.    ___________________________________________ Ihor Austin. Benjamin Stain, M.D., ABFM., CAQSM. Primary Care and Sports Medicine [facility]  Adjunct Instructor of Family Medicine  [employer]

## 2021-04-03 NOTE — Assessment & Plan Note (Addendum)
Brandon [name] is a pleasant 65 year old male, he has over a week of pain in his left shoulder over the deltoid, worse with abduction. His exam is for the most part benign but he does have a minimally positive Neer sign. Pain is waking him from sleep. Because of this we performed a subacromial injection today. I would like x-rays, muscle could add some rotator cuff conditioning exercises. Return to see me in 6 weeks.  Of note he did endorse a sensation of painful skin in the periscapular region and over the trapezius, has not had shingles vaccinations, I did advise him to be cognizant of any developing rash or malaise in which case we would start Valtrex.

## 2021-04-12 ENCOUNTER — Other Ambulatory Visit: Payer: Self-pay | Admitting: Family Medicine

## 2021-04-12 ENCOUNTER — Other Ambulatory Visit (HOSPITAL_COMMUNITY): Payer: Self-pay

## 2021-04-13 ENCOUNTER — Ambulatory Visit: Payer: No Typology Code available for payment source | Admitting: Sports Medicine

## 2021-04-13 ENCOUNTER — Ambulatory Visit: Payer: Self-pay | Admitting: Sports Medicine

## 2021-04-13 ENCOUNTER — Other Ambulatory Visit (HOSPITAL_COMMUNITY): Payer: Self-pay

## 2021-04-13 MED ORDER — ARIPIPRAZOLE 10 MG PO TABS
10.0000 mg | ORAL_TABLET | Freq: Every day | ORAL | 0 refills | Status: DC
Start: 2021-04-13 — End: 2021-07-17
  Filled 2021-04-13: qty 90, 90d supply, fill #0

## 2021-04-21 ENCOUNTER — Other Ambulatory Visit: Payer: Self-pay | Admitting: Sports Medicine

## 2021-04-21 DIAGNOSIS — M961 Postlaminectomy syndrome, not elsewhere classified: Secondary | ICD-10-CM

## 2021-04-22 MED ORDER — TRAMADOL HCL 50 MG PO TABS
100.0000 mg | ORAL_TABLET | Freq: Three times a day (TID) | ORAL | 0 refills | Status: DC | PRN
Start: 2021-04-22 — End: 2021-06-01
  Filled 2021-04-22: qty 180, 30d supply, fill #0

## 2021-04-23 ENCOUNTER — Other Ambulatory Visit (HOSPITAL_COMMUNITY): Payer: Self-pay

## 2021-04-24 LAB — HM DIABETES EYE EXAM

## 2021-04-25 ENCOUNTER — Encounter: Payer: Self-pay | Admitting: Family Medicine

## 2021-04-27 ENCOUNTER — Encounter: Payer: Self-pay | Admitting: Endocrinology

## 2021-05-01 ENCOUNTER — Other Ambulatory Visit (HOSPITAL_COMMUNITY): Payer: Self-pay

## 2021-05-07 ENCOUNTER — Other Ambulatory Visit (HOSPITAL_COMMUNITY): Payer: Self-pay

## 2021-05-07 ENCOUNTER — Other Ambulatory Visit: Payer: Self-pay | Admitting: Family Medicine

## 2021-05-08 ENCOUNTER — Other Ambulatory Visit (HOSPITAL_COMMUNITY): Payer: Self-pay

## 2021-05-08 ENCOUNTER — Other Ambulatory Visit: Payer: Self-pay

## 2021-05-08 ENCOUNTER — Ambulatory Visit: Payer: Self-pay

## 2021-05-08 ENCOUNTER — Other Ambulatory Visit: Payer: Self-pay | Admitting: Family Medicine

## 2021-05-08 ENCOUNTER — Ambulatory Visit (INDEPENDENT_AMBULATORY_CARE_PROVIDER_SITE_OTHER): Payer: No Typology Code available for payment source | Admitting: Sports Medicine

## 2021-05-08 DIAGNOSIS — M533 Sacrococcygeal disorders, not elsewhere classified: Secondary | ICD-10-CM

## 2021-05-08 DIAGNOSIS — I25110 Atherosclerotic heart disease of native coronary artery with unstable angina pectoris: Secondary | ICD-10-CM

## 2021-05-08 MED ORDER — EMPAGLIFLOZIN 25 MG PO TABS
25.0000 mg | ORAL_TABLET | Freq: Every day | ORAL | 0 refills | Status: DC
  Filled 2021-05-08: qty 90, 90d supply, fill #0

## 2021-05-08 NOTE — Assessment & Plan Note (Signed)
Known SI joint dysfunction, has done well in the past with these injections, repeat sacroiliac joint injection today, if this fails we need to take another "stab" at trying to get his facet joint injections approved.

## 2021-05-08 NOTE — Progress Notes (Signed)
    Procedures performed today:    Procedure: Real-time Ultrasound Guided injection of the right sacroiliac joint Device: Samsung HS60  Verbal informed consent obtained.  Time-out conducted.  Noted no overlying erythema, induration, or other signs of local infection.  Skin prepped in a sterile fashion.  Local anesthesia: Topical Ethyl chloride.  With sterile technique and under real time ultrasound guidance: Noted normal-appearing SI joint, 1 cc Kenalog 40, 2 cc lidocaine, 2 cc bupivacaine injected easily Completed without difficulty  Advised to call if fevers/chills, erythema, induration, drainage, or persistent bleeding.  Images permanently stored and available for review in PACS.  Impression: Technically successful ultrasound guided injection.  Independent interpretation of notes and tests performed by another provider:   None.  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  Chronic process with exacerbation and pharmacologic intervention  ___________________________________________ Ihor Austin. Benjamin Stain, M.D., ABFM., CAQSM. Primary Care and Sports Medicine [facility]  Adjunct Instructor of Family Medicine  [employer]

## 2021-05-11 ENCOUNTER — Other Ambulatory Visit (HOSPITAL_COMMUNITY): Payer: Self-pay

## 2021-05-15 ENCOUNTER — Ambulatory Visit: Payer: No Typology Code available for payment source | Admitting: Family Medicine

## 2021-05-15 ENCOUNTER — Encounter (HOSPITAL_BASED_OUTPATIENT_CLINIC_OR_DEPARTMENT_OTHER): Payer: No Typology Code available for payment source | Admitting: Cardiology

## 2021-05-16 ENCOUNTER — Other Ambulatory Visit (HOSPITAL_COMMUNITY): Payer: Self-pay

## 2021-05-16 ENCOUNTER — Encounter: Payer: Self-pay | Admitting: Endocrinology

## 2021-05-16 ENCOUNTER — Encounter: Payer: Self-pay | Admitting: Family Medicine

## 2021-05-17 ENCOUNTER — Other Ambulatory Visit (HOSPITAL_COMMUNITY): Payer: Self-pay

## 2021-05-17 ENCOUNTER — Ambulatory Visit: Payer: No Typology Code available for payment source | Admitting: Sports Medicine

## 2021-05-17 ENCOUNTER — Other Ambulatory Visit: Payer: Self-pay | Admitting: Family Medicine

## 2021-05-17 MED ORDER — BUPROPION HCL ER (XL) 150 MG PO TB24
150.0000 mg | ORAL_TABLET | Freq: Every day | ORAL | 0 refills | Status: DC
  Filled 2021-05-17: qty 90, 90d supply, fill #0

## 2021-05-17 MED ORDER — BUPROPION HCL ER (XL) 300 MG PO TB24
300.0000 mg | ORAL_TABLET | Freq: Every day | ORAL | 0 refills | Status: DC
Start: 2021-05-17 — End: 2021-08-21
  Filled 2021-05-17: qty 90, 90d supply, fill #0

## 2021-05-21 ENCOUNTER — Other Ambulatory Visit (HOSPITAL_COMMUNITY): Payer: Self-pay

## 2021-05-21 ENCOUNTER — Ambulatory Visit (INDEPENDENT_AMBULATORY_CARE_PROVIDER_SITE_OTHER): Payer: No Typology Code available for payment source | Admitting: Family Medicine

## 2021-05-21 ENCOUNTER — Encounter: Payer: Self-pay | Admitting: Family Medicine

## 2021-05-21 VITALS — BP 119/69 | HR 86 | Ht 69.0 in | Wt 215.0 lb

## 2021-05-21 DIAGNOSIS — B370 Candidal stomatitis: Secondary | ICD-10-CM | POA: Insufficient documentation

## 2021-05-21 DIAGNOSIS — R438 Other disturbances of smell and taste: Secondary | ICD-10-CM | POA: Diagnosis not present

## 2021-05-21 MED ORDER — NYSTATIN 100000 UNIT/ML MT SUSP
5.0000 mL | Freq: Four times a day (QID) | OROMUCOSAL | 1 refills | Status: DC
  Filled 2021-05-21: qty 200, 10d supply, fill #0

## 2021-05-21 NOTE — Patient Instructions (Signed)

## 2021-05-21 NOTE — Assessment & Plan Note (Signed)
Possibly related to thrush.  Checking additional labs today including B vitamins, zinc and lead.

## 2021-05-21 NOTE — Progress Notes (Signed)
Brandon [name] - 65 y.o. male MRN [id number]  Date of birth: [birth year]  Subjective Chief Complaint  Patient presents with   Fatigue   Difficulty Walking   Oral Pain    HPI Brandon [name] is a 65 year old male here today for follow-up of oral pain.  He complains of continued burning sensation in his mouth with cracking along the corners of his lips.  Reports metallic taste in his mouth.  He has been using Magic mouthwash with temporary relief.  He does have pain with swallowing.  He has noticed some white patches on the inside of his mouth.  He denies fever, chills.  ROS:  A comprehensive ROS was completed and negative except as noted per HPI  Allergies  Allergen Reactions   Kiwi Extract Swelling    Mouth swelling.    Other Swelling    EGGPLANT. Mouth swelling.   Penicillins Rash    Reaction: unknown   Peach [Prunus Persica] Other (See Comments)    Burns mouth   Ancef [Cefazolin] Rash   Latex Rash   Levaquin [Levofloxacin] Rash    Past Medical History:  Diagnosis Date   Anxiety    Basal cell carcinoma (BCC) in situ of skin    Colon polyps    Coronary artery disease    S/p DES to mRCA in 12/21 // S/p DES to LCx x 2 in 12/2020 // Diff dz in small to mod Dx (not a good target for PCI); OM1 100 CTO; severe dz in small OM2 and PDA >> Med Rx   Depression    Diabetes mellitus without complication ([facility])    HLD (hyperlipidemia)    Hypertension    SBO (small bowel obstruction) ([facility])     Past Surgical History:  Procedure Laterality Date   BACK SURGERY     CARDIAC CATHETERIZATION     CATARACT EXTRACTION W/ INTRAOCULAR LENS  IMPLANT, BILATERAL     CHOLECYSTECTOMY     CHOLECYSTECTOMY, LAPAROSCOPIC     CORONARY STENT INTERVENTION N/A 08/15/2020   Procedure: CORONARY STENT INTERVENTION;  Surgeon: Corky Crafts, MD;  Location: [facility];  Service: Cardiovascular;  Laterality: N/A;   CORONARY STENT INTERVENTION N/A 01/17/2021   Procedure: CORONARY STENT INTERVENTION;   Surgeon: Kathleene Hazel, MD;  Location: [facility];  Service: Cardiovascular;  Laterality: N/A;   EYE SURGERY     INTRAVASCULAR ULTRASOUND/IVUS N/A 08/15/2020   Procedure: Intravascular Ultrasound/IVUS;  Surgeon: Corky Crafts, MD;  Location: [facility];  Service: Cardiovascular;  Laterality: N/A;   LEFT HEART CATH AND CORONARY ANGIOGRAPHY N/A 08/15/2020   Procedure: LEFT HEART CATH AND CORONARY ANGIOGRAPHY;  Surgeon: Corky Crafts, MD;  Location: [facility];  Service: Cardiovascular;  Laterality: N/A;   LEFT HEART CATH AND CORONARY ANGIOGRAPHY N/A 01/17/2021   Procedure: LEFT HEART CATH AND CORONARY ANGIOGRAPHY;  Surgeon: Kathleene Hazel, MD;  Location: [facility];  Service: Cardiovascular;  Laterality: N/A;   LUMBAR FUSION     L3-5 with rods   XI ROBOTIC ASSISTED INGUINAL HERNIA REPAIR WITH MESH     x 3 surgeries    Social History   Socioeconomic History   Marital status: Married    Spouse name: Not on file   Number of children: 4   Years of education: 16   Highest education level: Not on file  Occupational History   Occupation: disabled  Tobacco Use   Smoking status: Former    Types:  Cigarettes    Quit date: 2007    Years since quitting: 15.7   Smokeless tobacco: Never  Vaping Use   Vaping Use: Not on file  Substance and Sexual Activity   Alcohol use: Not Currently   Drug use: Not Currently   Sexual activity: Yes  Other Topics Concern   Not on file  Social History Narrative   Not on file   Social Determinants of Health   Financial Resource Strain: Not on file  Food Insecurity: Not on file  Transportation Needs: Not on file  Physical Activity: Not on file  Stress: Not on file  Social Connections: Not on file    Family History  Problem Relation Age of Onset   COPD Mother    Anxiety disorder Mother    Depression Mother    Cancer Father    Anxiety disorder Father    Depression Father    Other  Father    Diabetes Neg Hx     Health Maintenance  Topic Date Due   Hepatitis C Screening  Never done   TETANUS/TDAP  Never done   COLONOSCOPY (Pts 45-84yrs Insurance coverage will need to be confirmed)  Never done   Zoster Vaccines- Shingrix (1 of 2) Never done   COVID-19 Vaccine (3 - Booster for Pfizer series) 11/14/2020   INFLUENZA VACCINE  03/26/2021   HEMOGLOBIN A1C  09/22/2021   FOOT EXAM  03/22/2022   OPHTHALMOLOGY EXAM  04/24/2022   HIV Screening  Completed   HPV VACCINES  Aged Out     ----------------------------------------------------------------------------------------------------------------------------------------------------------------------------------------------------------------- Physical Exam BP 119/69 (BP Location: Left Arm, Patient Position: Sitting, Cuff Size: Large)   Pulse 86   Ht 5\' 9"  (1.753 m)   Wt 215 lb (97.5 kg)   SpO2 97%   BMI 31.75 kg/m   Physical Exam Constitutional:      Appearance: Normal appearance.  HENT:     Head: Normocephalic and atraumatic.     Mouth/Throat:     Comments: White patches on buccal mucosa.  Mild erythema on the tongue. Eyes:     General: No scleral icterus. Cardiovascular:     Rate and Rhythm: Normal rate and regular rhythm.  Pulmonary:     Effort: Pulmonary effort is normal.     Breath sounds: Normal breath sounds.  Musculoskeletal:     Cervical back: Neck supple.  Neurological:     Mental Status: He is alert.  Psychiatric:        Mood and Affect: Mood normal.        Behavior: Behavior normal.    ------------------------------------------------------------------------------------------------------------------------------------------------------------------------------------------------------------------- Assessment and Plan  Thrush Starting nystatin swish and swallow.  If not improving with this we discussed adding systemic antifungal.  Additional labs ordered today including HIV.  Blood sugars have  been well controlled at home recently.  Metallic taste Possibly related to thrush.  Checking additional labs today including B vitamins, zinc and lead.   Meds ordered this encounter  Medications   nystatin (MYCOSTATIN) 100000 UNIT/ML suspension    Sig: Use as directed 5 mLs (500,000 Units total) in the mouth or throat 4 (four) times daily for 7 days.    Dispense:  200 mL    Refill:  1    No follow-ups on file.    This visit occurred during the SARS-CoV-2 public health emergency.  Safety protocols were in place, including screening questions prior to the visit, additional usage of staff PPE, and extensive cleaning of exam room while observing appropriate contact  time as indicated for disinfecting solutions.

## 2021-05-21 NOTE — Assessment & Plan Note (Signed)
Starting nystatin swish and swallow.  If not improving with this we discussed adding systemic antifungal.  Additional labs ordered today including HIV.  Blood sugars have been well controlled at home recently.

## 2021-05-24 LAB — HIV ANTIBODY (ROUTINE TESTING W REFLEX): HIV 1&2 Ab, 4th Generation: NONREACTIVE

## 2021-05-24 LAB — VITAMIN B1: Vitamin B1 (Thiamine): 7 nmol/L — ABNORMAL LOW (ref 8–30)

## 2021-05-24 LAB — ZINC: Zinc: 125 ug/dL (ref 60–130)

## 2021-05-24 LAB — LEAD, BLOOD (ADULT >= 16 YRS): Lead: <1 ug/dL (ref <3.5)

## 2021-05-24 LAB — VITAMIN B6: Vitamin B6: 6 ng/mL (ref 2.1–21.7)

## 2021-05-24 LAB — B12 AND FOLATE PANEL
Folate: 10.1 ng/mL
Vitamin B-12: 335 pg/mL (ref 200–1100)

## 2021-05-28 ENCOUNTER — Ambulatory Visit (HOSPITAL_BASED_OUTPATIENT_CLINIC_OR_DEPARTMENT_OTHER): Payer: No Typology Code available for payment source | Attending: Cardiology | Admitting: Cardiology

## 2021-05-28 ENCOUNTER — Other Ambulatory Visit: Payer: Self-pay

## 2021-05-28 ENCOUNTER — Telehealth: Payer: Self-pay

## 2021-05-28 ENCOUNTER — Other Ambulatory Visit: Payer: Self-pay | Admitting: Family Medicine

## 2021-05-28 ENCOUNTER — Other Ambulatory Visit (HOSPITAL_COMMUNITY): Payer: Self-pay

## 2021-05-28 DIAGNOSIS — G4733 Obstructive sleep apnea (adult) (pediatric): Secondary | ICD-10-CM | POA: Insufficient documentation

## 2021-05-28 DIAGNOSIS — R0902 Hypoxemia: Secondary | ICD-10-CM | POA: Diagnosis not present

## 2021-05-28 MED ORDER — THIAMINE HCL 100 MG PO TABS
100.0000 mg | ORAL_TABLET | Freq: Every day | ORAL | 1 refills | Status: DC
  Filled 2021-05-28: qty 90, 90d supply, fill #0

## 2021-05-28 NOTE — Telephone Encounter (Signed)
Pt lvm requesting callback concerning documents ordered from [place] after his last visit.   Advised the patient that per Dr. Ashley Royalty no new records have been received yet.

## 2021-05-29 ENCOUNTER — Ambulatory Visit (INDEPENDENT_AMBULATORY_CARE_PROVIDER_SITE_OTHER): Payer: No Typology Code available for payment source | Admitting: Endocrinology

## 2021-05-29 ENCOUNTER — Other Ambulatory Visit (HOSPITAL_COMMUNITY): Payer: Self-pay

## 2021-05-29 ENCOUNTER — Other Ambulatory Visit (HOSPITAL_BASED_OUTPATIENT_CLINIC_OR_DEPARTMENT_OTHER): Payer: Self-pay

## 2021-05-29 VITALS — BP 140/70 | HR 80 | Ht 69.0 in | Wt 214.0 lb

## 2021-05-29 DIAGNOSIS — I25110 Atherosclerotic heart disease of native coronary artery with unstable angina pectoris: Secondary | ICD-10-CM

## 2021-05-29 DIAGNOSIS — E1169 Type 2 diabetes mellitus with other specified complication: Secondary | ICD-10-CM | POA: Diagnosis not present

## 2021-05-29 LAB — POCT GLYCOSYLATED HEMOGLOBIN (HGB A1C): Hemoglobin A1C: 6.1 % — AB (ref 4.0–5.6)

## 2021-05-29 MED ORDER — OZEMPIC (2 MG/DOSE) 8 MG/3ML ~~LOC~~ SOPN
2.0000 mg | PEN_INJECTOR | SUBCUTANEOUS | 3 refills | Status: DC
  Filled 2021-05-29: qty 3, 28d supply, fill #0
  Filled 2021-06-25 – 2021-06-26 (×3): qty 3, 28d supply, fill #1
  Filled 2021-07-31: qty 3, 28d supply, fill #2
  Filled 2021-07-31: qty 3, 28d supply, fill #0
  Filled 2021-08-29: qty 3, 28d supply, fill #1
  Filled 2021-09-26: qty 9, 84d supply, fill #2
  Filled 2021-12-12: qty 9, 84d supply, fill #3
  Filled 2022-02-27: qty 9, 84d supply, fill #4
  Filled 2022-03-08: qty 6, 56d supply, fill #4

## 2021-05-29 NOTE — Patient Instructions (Addendum)
check your blood sugar twice a day.  vary the time of day when you check, between before the 3 meals, and at bedtime.  also check if you have symptoms of your blood sugar being too high or too low.  please keep a record of the readings and bring it to your next appointment here (or you can bring the meter itself).  You can write it on any piece of paper.  please call us sooner if your blood sugar goes below 70, or if most of your readings are over 200.  I have sent a prescription to your pharmacy, to double the Ozempic again, and:   Stop taking the [place], and:  continue the same Humalog: 1 unit/3.5 grams, and:  Please continue the same Jardiance.  Please come back for a follow-up appointment in January.

## 2021-05-29 NOTE — Progress Notes (Signed)
Subjective:    Patient ID: Brandon [name], male    DOB: [birth year], 65 y.o.   MRN: [id number]  HPI Pt returns for f/u of diabetes mellitus: DM type: Insulin-requiring type 2  Dx'ed: 3729 Complications: CAD Therapy: insulin since 2012 DKA: never Severe hypoglycemia: never Pancreatitis: never Pancreatic imaging: normal on 2021 CT SDOH: none Other: he takes multiple daily injections; he declines pump.   Interval history: He has mild hypoglycemia approx once per week.  This usually happens fasting, or in the afternoon.  pt states he feels well in general.  I reviewed continuous glucose monitor data.  Glucose varies from 70-220.  It is in general highest at [place], 2PM, and 9PM.  It is lowest 1AM-6AM.   Past Medical History:  Diagnosis Date   Anxiety    Basal cell carcinoma (BCC) in situ of skin    Colon polyps    Coronary artery disease    S/p DES to mRCA in 12/21 // S/p DES to LCx x 2 in 12/2020 // Diff dz in small to mod Dx (not a good target for PCI); OM1 100 CTO; severe dz in small OM2 and PDA >> Med Rx   Depression    Diabetes mellitus without complication ([place])    HLD (hyperlipidemia)    Hypertension    SBO (small bowel obstruction) ([place])     Past Surgical History:  Procedure Laterality Date   BACK SURGERY     CARDIAC CATHETERIZATION     CATARACT EXTRACTION W/ INTRAOCULAR LENS  IMPLANT, BILATERAL     CHOLECYSTECTOMY     CHOLECYSTECTOMY, LAPAROSCOPIC     CORONARY STENT INTERVENTION N/A 08/15/2020   Procedure: CORONARY STENT INTERVENTION;  Surgeon: Jettie Booze, MD;  Location: [facility];  Service: Cardiovascular;  Laterality: N/A;   CORONARY STENT INTERVENTION N/A 01/17/2021   Procedure: CORONARY STENT INTERVENTION;  Surgeon: Burnell Blanks, MD;  Location: [facility];  Service: Cardiovascular;  Laterality: N/A;   EYE SURGERY     INTRAVASCULAR ULTRASOUND/IVUS N/A 08/15/2020   Procedure: Intravascular Ultrasound/IVUS;  Surgeon: Jettie Booze, MD;  Location: [facility];  Service: Cardiovascular;  Laterality: N/A;   LEFT HEART CATH AND CORONARY ANGIOGRAPHY N/A 08/15/2020   Procedure: LEFT HEART CATH AND CORONARY ANGIOGRAPHY;  Surgeon: Jettie Booze, MD;  Location: [facility];  Service: Cardiovascular;  Laterality: N/A;   LEFT HEART CATH AND CORONARY ANGIOGRAPHY N/A 01/17/2021   Procedure: LEFT HEART CATH AND CORONARY ANGIOGRAPHY;  Surgeon: Burnell Blanks, MD;  Location: [facility];  Service: Cardiovascular;  Laterality: N/A;   LUMBAR FUSION     L3-5 with rods   XI ROBOTIC ASSISTED INGUINAL HERNIA REPAIR WITH MESH     x 3 surgeries    Social History   Socioeconomic History   Marital status: Married    Spouse name: Not on file   Number of children: 4   Years of education: 16   Highest education level: Not on file  Occupational History   Occupation: disabled  Tobacco Use   Smoking status: Former    Types: Cigarettes    Quit date: 2007    Years since quitting: 15.7   Smokeless tobacco: Never  Vaping Use   Vaping Use: Not on file  Substance and Sexual Activity   Alcohol use: Not Currently   Drug use: Not Currently   Sexual activity: Yes  Other Topics Concern   Not on file  Social History Narrative   Not on file   Social Determinants of Health   Financial Resource Strain: Not on file  Food Insecurity: Not on file  Transportation Needs: Not on file  Physical Activity: Not on file  Stress: Not on file  Social Connections: Not on file  Intimate Partner Violence: Not on file    Current Outpatient Medications on File Prior to Visit  Medication Sig Dispense Refill   amLODipine (NORVASC) 10 MG tablet Take 0.5 tablets (5 mg total) by mouth every morning. 45 tablet 3   ARIPiprazole (ABILIFY) 10 MG tablet Take 10 mg by mouth every morning.     ARIPiprazole (ABILIFY) 10 MG tablet Take 1 tablet (10 mg total) by mouth daily. 90 tablet 0   aspirin 81 MG EC tablet Take 81  mg by mouth every morning.     atorvastatin (LIPITOR) 80 MG tablet TAKE 1 TABLET (80 MG TOTAL) BY MOUTH DAILY. 90 tablet 3   Blood Glucose Monitoring Suppl (FREESTYLE LITE) w/Device KIT USE AS DIRECTED. 1 kit 99   Blood Pressure Monitoring (OMRON 3 SERIES BP MONITOR) DEVI USE AS DIRECTED. 1 each 0   buPROPion (WELLBUTRIN XL) 150 MG 24 hr tablet Take 1 tablet (150 mg total) by mouth daily. 90 tablet 0   buPROPion (WELLBUTRIN XL) 300 MG 24 hr tablet Take 1 tablet (300 mg total) by mouth daily. 90 tablet 0   clopidogrel (PLAVIX) 75 MG tablet TAKE 1 TABLET (75 MG TOTAL) BY MOUTH DAILY WITH BREAKFAST. 90 tablet 3   Continuous Blood Gluc Sensor (DEXCOM G6 SENSOR) MISC Use as directed. Change sensor every 10 days 9 each 3   empagliflozin (JARDIANCE) 25 MG TABS tablet Take 1 tablet (25 mg total) by mouth daily. 90 tablet 0   glucose blood test strip USE 1 TO CHECK BLOOD GLUCOSE UP TO 5 TIMES DAILY. (Patient taking differently: USE 1 TO CHECK BLOOD GLUCOSE UP TO 5 TIMES DAILY.) 300 strip 12   insulin lispro (HUMALOG) 100 UNIT/ML KwikPen Inject 24 Units into the skin 3 (three) times daily before meals. 75 mL 3   isosorbide mononitrate (IMDUR) 30 MG 24 hr tablet Take 1 tablet (30 mg total) by mouth daily. 90 tablet 1   Lancets (FREESTYLE) lancets USE 1 TO CHECK BLOOD GLUCOSE UP TO 5 TIMES DAILY. (Patient taking differently: USE 1 TO CHECK BLOOD GLUCOSE UP TO 5 TIMES DAILY.) 300 each 12   losartan (COZAAR) 25 MG tablet Take 1 tablet (25 mg total) by mouth daily. Take only when systolic BP greater than 092. 90 tablet 1   metoCLOPramide (REGLAN) 10 MG tablet Take 10 mg by mouth daily as needed for nausea or vomiting (upset stomach).     metoprolol succinate (TOPROL-XL) 50 MG 24 hr tablet Take 1 tablet (50 mg total) by mouth daily. Take with or immediately following a meal. 90 tablet 1   nystatin (MYCOSTATIN) 100000 UNIT/ML suspension Use as directed 5 mLs (500,000 Units total) in the mouth or throat 4 (four) times  daily for 7 days. 200 mL 1   ondansetron (ZOFRAN ODT) 4 MG disintegrating tablet Take 1 tablet (4 mg total) by mouth every 8 (eight) hours as needed for nausea or vomiting. 20 tablet 0   pantoprazole (PROTONIX) 40 MG tablet Take 1 tablet (40 mg total) by mouth daily. 90 tablet 0   prednisoLONE-lidocaine-nystatin-diphenhydrAMINE-alum & mag hydroxide-simeth Swish and spit 74ms 3 times daily as needed for mouth pain. 200 mL 1   tamsulosin (FLOMAX)  0.4 MG CAPS capsule Take 1 capsule (0.4 mg total) by mouth daily. 90 capsule 0   testosterone cypionate (DEPOTESTOSTERONE CYPIONATE) 200 MG/ML injection Inject 1 mL (200 mg total) into the muscle every Sunday. 5 mL 1   thiamine 100 MG tablet Take 1 tablet (100 mg total) by mouth daily. 90 tablet 1   tiZANidine (ZANAFLEX) 4 MG tablet Take 2 tablets (8 mg total) by mouth 3 (three) times daily. 180 tablet 11   traMADol (ULTRAM) 50 MG tablet Take 2 tablets (100 mg total) by mouth 3 (three) times daily as needed. 180 tablet 0   nitroGLYCERIN (NITROSTAT) 0.4 MG SL tablet Place 1 tablet (0.4 mg total) under the tongue every 5 (five) minutes as needed for chest pain. 60 tablet 0   triazolam (HALCION) 0.25 MG tablet TAKE 1-2 TABS BY MOUTH 2 HOURS BEFORE PROCEDURE OR IMAGING.DO NOT DRIVE WITH THIS MEDICATION. 2 tablet 0   No current facility-administered medications on file prior to visit.    Allergies  Allergen Reactions   Kiwi Extract Swelling    Mouth swelling.    Other Swelling    EGGPLANT. Mouth swelling.   Penicillins Rash    Reaction: unknown   Peach [Prunus Persica] Other (See Comments)    Burns mouth   Ancef [Cefazolin] Rash   Latex Rash   Levaquin [Levofloxacin] Rash    Family History  Problem Relation Age of Onset   COPD Mother    Anxiety disorder Mother    Depression Mother    Cancer Father    Anxiety disorder Father    Depression Father    Other Father    Diabetes Neg Hx     BP 140/70 (BP Location: Right Arm, Patient Position:  Sitting, Cuff Size: Large)   Pulse 80   Ht _0  (1.753 m)   Wt 214 lb (97.1 kg)   SpO2 96%   BMI 31.60 kg/m    Review of Systems Denies N/V/HB    Objective:   Physical Exam Pulses: dorsalis pedis intact bilat.   MSK: no deformity of the feet CV: no leg edema Skin:  no ulcer on the feet.  normal color and temp on the feet. Neuro: sensation is intact to touch on the feet.     Lab Results  Component Value Date   HGBA1C 6.1 (A) 05/29/2021      Assessment & Plan:  Insulin-requiring type 2 DM. Hypoglycemia, due to insulin: he again declines pump.  Patient Instructions  check your blood sugar twice a day.  vary the time of day when you check, between before the 3 meals, and at bedtime.  also check if you have symptoms of your blood sugar being too high or too low.  please keep a record of the readings and bring it to your next appointment here (or you can bring the meter itself).  You can write it on any piece of paper.  please call us sooner if your blood sugar goes below 70, or if most of your readings are over 200.  I have sent a prescription to your pharmacy, to double the Ozempic again, and:   Stop taking the [place], and:  continue the same Humalog: 1 unit/3.5 grams, and:  Please continue the same Jardiance.  Please come back for a follow-up appointment in January.

## 2021-05-30 ENCOUNTER — Other Ambulatory Visit (HOSPITAL_COMMUNITY): Payer: Self-pay

## 2021-05-30 NOTE — Procedures (Signed)
   Patient Name: Brandon [name], Brandon [name] Date: 05/28/2021 Gender: Male D.O.B: Dec 23, 1955 Age (years): 18 Referring Provider: Laurance Flatten MD Height (inches): 69 Interpreting Physician: Armanda Magic MD, ABSM Weight (lbs): 231 RPSGT: Twining Sink BMI: 34 MRN: [id number] Neck Size: 18.00  CLINICAL INFORMATION The patient is referred for a BiPAP titration to treat sleep apnea.  SLEEP STUDY TECHNIQUE As per the AASM Manual for the Scoring of Sleep and Associated Events v2.3 (April 2016) with a hypopnea requiring 4% desaturations.  The channels recorded and monitored were frontal, central and occipital EEG, electrooculogram (EOG), submentalis EMG (chin), nasal and oral airflow, thoracic and abdominal wall motion, anterior tibialis EMG, snore microphone, electrocardiogram, and pulse oximetry. Bilevel positive airway pressure (BPAP) was initiated at the beginning of the study and titrated to treat sleep-disordered breathing.  MEDICATIONS Medications self-administered by patient taken the night of the study : N/A  RESPIRATORY PARAMETERS Optimal IPAP Pressure (cm): N/A  AHI at Optimal Pressure (/hr) N/A Optimal EPAP Pressure (cm):N/A  Overall Minimal O2 (%):84.0  Minimal O2 at Optimal Pressure (%): N/A  SLEEP ARCHITECTURE Start Time:11:15:23 PM  Stop Time:5:10:04 AM  Total Time (min):354.7  Total Sleep Time (min):263.5 Sleep Latency (min):4.0  Sleep Efficiency (%):74.3%  REM Latency (min):122.5  WASO (min):87.2 Stage N1 (%): 18.2%  Stage N2 (%): 68.3%  Stage N3 (%): 0.0%  Stage R (%):13.5 Supine (%):49.91  Arousal Index (/hr):36.7   CARDIAC DATA The 2 lead EKG demonstrated sinus rhythm. The mean heart rate was 72.6 beats per minute. Other EKG findings include: PVCs.  LEG MOVEMENT DATA The total Periodic Limb Movements of Sleep (PLMS) were 0. The PLMS index was 0.0. A PLMS index of <15 is considered normal in adults.  IMPRESSIONS - An optimal PAP pressure could not  be selected for this patient based on the available study data. - Mild Central Sleep Apnea was noted during this titration (CAI = 8.9/h). - Moderete oxygen desaturations were observed during this titration (min O2 = 84.0%). - The patient snored with moderate snoring volume. - 2-lead EKG demonstrated: PVCs - Clinically significant periodic limb movements were not noted during this study. Arousals associated with PLMs were rare.  DIAGNOSIS - Obstructive Sleep Apnea (G47.33) - Nocturnal Hypoxemia  RECOMMENDATIONS - Recommend a trial of Auto-BiPAP with IPAP max 20cm H2O, EPAP min 5cm H2O and PS 4cm H2O - Avoid alcohol, sedatives and other CNS depressants that may worsen sleep apnea and disrupt normal sleep architecture. - Sleep hygiene should be reviewed to assess factors that may improve sleep quality. - Weight management and regular exercise should be initiated or continued. - Return to Sleep Center for re-evaluation after 4 weeks of therapy  [Electronically signed] 05/30/2021 12:02 PM  Armanda Magic MD, ABSM Diplomate, American Board of Sleep Medicine

## 2021-06-01 ENCOUNTER — Other Ambulatory Visit: Payer: Self-pay | Admitting: Cardiology

## 2021-06-01 ENCOUNTER — Other Ambulatory Visit (HOSPITAL_BASED_OUTPATIENT_CLINIC_OR_DEPARTMENT_OTHER): Payer: Self-pay

## 2021-06-01 ENCOUNTER — Encounter: Payer: Self-pay | Admitting: Family Medicine

## 2021-06-01 ENCOUNTER — Other Ambulatory Visit: Payer: Self-pay | Admitting: Family Medicine

## 2021-06-01 ENCOUNTER — Other Ambulatory Visit: Payer: Self-pay | Admitting: Sports Medicine

## 2021-06-01 DIAGNOSIS — I208 Other forms of angina pectoris: Secondary | ICD-10-CM

## 2021-06-01 DIAGNOSIS — G894 Chronic pain syndrome: Secondary | ICD-10-CM | POA: Insufficient documentation

## 2021-06-01 DIAGNOSIS — M961 Postlaminectomy syndrome, not elsewhere classified: Secondary | ICD-10-CM

## 2021-06-01 DIAGNOSIS — I2089 Other forms of angina pectoris: Secondary | ICD-10-CM

## 2021-06-01 DIAGNOSIS — I25118 Atherosclerotic heart disease of native coronary artery with other forms of angina pectoris: Secondary | ICD-10-CM

## 2021-06-01 DIAGNOSIS — G4733 Obstructive sleep apnea (adult) (pediatric): Secondary | ICD-10-CM

## 2021-06-01 DIAGNOSIS — F5101 Primary insomnia: Secondary | ICD-10-CM

## 2021-06-01 DIAGNOSIS — I10 Essential (primary) hypertension: Secondary | ICD-10-CM

## 2021-06-01 MED ORDER — PANTOPRAZOLE SODIUM 40 MG PO TBEC
40.0000 mg | DELAYED_RELEASE_TABLET | Freq: Every day | ORAL | 0 refills | Status: DC
  Filled 2021-06-01: qty 90, 90d supply, fill #0

## 2021-06-01 MED ORDER — TRAMADOL HCL 50 MG PO TABS
100.0000 mg | ORAL_TABLET | Freq: Three times a day (TID) | ORAL | 0 refills | Status: DC | PRN
Start: 2021-06-01 — End: 2021-11-02
  Filled 2021-06-08: qty 180, 30d supply, fill #0

## 2021-06-01 MED ORDER — TESTOSTERONE CYPIONATE 200 MG/ML IM SOLN
200.0000 mg | INTRAMUSCULAR | 1 refills | Status: DC
  Filled 2021-06-15: qty 5, 35d supply, fill #0
  Filled 2021-06-21: qty 4, 28d supply, fill #0

## 2021-06-01 MED ORDER — TRAZODONE HCL 50 MG PO TABS
ORAL_TABLET | ORAL | 3 refills | Status: DC
  Filled 2021-06-01: qty 60, 30d supply, fill #0
  Filled 2021-07-09: qty 60, 30d supply, fill #1

## 2021-06-01 MED FILL — Clopidogrel Bisulfate Tab 75 MG (Base Equiv): ORAL | 90 days supply | Qty: 90 | Fill #2 | Status: AC

## 2021-06-01 MED FILL — Atorvastatin Calcium Tab 80 MG (Base Equivalent): ORAL | 90 days supply | Qty: 90 | Fill #2 | Status: AC

## 2021-06-01 NOTE — Telephone Encounter (Signed)
I would agree, I will refill this 1 more time and please let him know he needs to get in with pain management for further prescriptions.

## 2021-06-01 NOTE — Telephone Encounter (Signed)
Last Fill: 04/22/21 #180 Patient has an appt scheduled on Tues, 06/05/21. Based on the note on this prescription, I didn't want to get another provider involved.

## 2021-06-01 NOTE — Assessment & Plan Note (Signed)
Tramadol refilled, further refills need to be through pain management, patient informed.

## 2021-06-04 ENCOUNTER — Telehealth: Payer: Self-pay | Admitting: *Deleted

## 2021-06-04 ENCOUNTER — Other Ambulatory Visit (HOSPITAL_BASED_OUTPATIENT_CLINIC_OR_DEPARTMENT_OTHER): Payer: Self-pay

## 2021-06-04 DIAGNOSIS — G4733 Obstructive sleep apnea (adult) (pediatric): Secondary | ICD-10-CM

## 2021-06-04 MED ORDER — ISOSORBIDE MONONITRATE ER 30 MG PO TB24
30.0000 mg | ORAL_TABLET | Freq: Every day | ORAL | 2 refills | Status: DC
  Filled 2021-06-04: qty 90, 90d supply, fill #0

## 2021-06-04 MED ORDER — METOPROLOL SUCCINATE ER 50 MG PO TB24
50.0000 mg | ORAL_TABLET | Freq: Every day | ORAL | 2 refills | Status: DC
  Filled 2021-06-04: qty 90, 90d supply, fill #0

## 2021-06-04 NOTE — Telephone Encounter (Signed)
Upon patient request DME selection is [facility] Patient understands he will be contacted by [facility] to set up his cpap. Patient understands to call if [facility] does not contact him with new setup in a timely manner. Patient understands they will be called once confirmation has been received from [facility] that they have received their new machine to schedule 10 week follow up appointment.   [facility] notified of new cpap order  Please add to airview Patient was grateful for the call and thanked me  

## 2021-06-04 NOTE — Telephone Encounter (Signed)
Patient aware this is the last fill. He has an appt tomorrow and can address pain mgmt at that time.

## 2021-06-04 NOTE — Telephone Encounter (Signed)
-----   Message from Quintella Reichert, MD sent at 05/30/2021 12:05 PM EDT ----- We are going to start patient on auto BIPAP.  Please get an overnight pulse ox on BIPAP and followup with me in 4 weeks

## 2021-06-04 NOTE — Telephone Encounter (Signed)
The patient has been notified of the result and verbalized understanding.  All questions (if any) were answered. [name], CMA 06/04/2021 4:41 PM

## 2021-06-05 ENCOUNTER — Ambulatory Visit (INDEPENDENT_AMBULATORY_CARE_PROVIDER_SITE_OTHER): Payer: No Typology Code available for payment source | Admitting: Sports Medicine

## 2021-06-05 ENCOUNTER — Other Ambulatory Visit: Payer: Self-pay

## 2021-06-05 ENCOUNTER — Other Ambulatory Visit (HOSPITAL_COMMUNITY): Payer: Self-pay

## 2021-06-05 ENCOUNTER — Other Ambulatory Visit (HOSPITAL_BASED_OUTPATIENT_CLINIC_OR_DEPARTMENT_OTHER): Payer: Self-pay

## 2021-06-05 DIAGNOSIS — M47816 Spondylosis without myelopathy or radiculopathy, lumbar region: Secondary | ICD-10-CM

## 2021-06-05 DIAGNOSIS — I25110 Atherosclerotic heart disease of native coronary artery with unstable angina pectoris: Secondary | ICD-10-CM

## 2021-06-05 NOTE — Progress Notes (Signed)
    Procedures performed today:    None.  Independent interpretation of notes and tests performed by another provider:   None.  Brief History, Exam, Impression, and Recommendations:    Facet arthropathy, lumbar This is a pleasant 65 year old male, he moved here from [place], he has a history of back surgery, with persistent pain/failed back surgery syndrome, he has had multiple interventions including a spinal cord stimulator placement, he does not have a pain provider or neurosurgeon monitoring this. His most recent MRI showed bilateral severe L5-S1 facet joint arthritis. At a previous visit I did bilateral sacroiliac joint injections with ultrasound guidance, he really did not get significant relief so I do think his facets are significant pain generators. I really think he needs a pain management provider, he will need a combination of medical pain management and likely facet blocks and ablations, I cannot do the latter and I think chronic pain management with tramadol would be better served by pain management provider. We did a referral back in December and seems like it fell through the cracks so I will do this again. I did refill his tramadol a few days ago and he understands that further refills will need to be through an actual pain management provider.    ___________________________________________ Ihor Austin. Benjamin Stain, M.D., ABFM., CAQSM. Primary Care and Sports Medicine [facility]  Adjunct Instructor of Family Medicine  [employer]

## 2021-06-05 NOTE — Assessment & Plan Note (Signed)
This is a pleasant 65 year old male, he moved here from [place], he has a history of back surgery, with persistent pain/failed back surgery syndrome, he has had multiple interventions including a spinal cord stimulator placement, he does not have a pain provider or neurosurgeon monitoring this. His most recent MRI showed bilateral severe L5-S1 facet joint arthritis. At a previous visit I did bilateral sacroiliac joint injections with ultrasound guidance, he really did not get significant relief so I do think his facets are significant pain generators. I really think he needs a pain management provider, he will need a combination of medical pain management and likely facet blocks and ablations, I cannot do the latter and I think chronic pain management with tramadol would be better served by pain management provider. We did a referral back in December and seems like it fell through the cracks so I will do this again. I did refill his tramadol a few days ago and he understands that further refills will need to be through an actual pain management provider.

## 2021-06-08 ENCOUNTER — Other Ambulatory Visit (HOSPITAL_BASED_OUTPATIENT_CLINIC_OR_DEPARTMENT_OTHER): Payer: Self-pay

## 2021-06-13 ENCOUNTER — Encounter: Payer: Self-pay | Admitting: Endocrinology

## 2021-06-14 ENCOUNTER — Other Ambulatory Visit (HOSPITAL_BASED_OUTPATIENT_CLINIC_OR_DEPARTMENT_OTHER): Payer: Self-pay

## 2021-06-14 MED ORDER — BUPRENORPHINE 5 MCG/HR TD PTWK
MEDICATED_PATCH | TRANSDERMAL | 0 refills | Status: DC
  Filled 2021-06-14: qty 4, 28d supply, fill #0

## 2021-06-15 ENCOUNTER — Other Ambulatory Visit (HOSPITAL_COMMUNITY): Payer: Self-pay

## 2021-06-15 ENCOUNTER — Other Ambulatory Visit (HOSPITAL_BASED_OUTPATIENT_CLINIC_OR_DEPARTMENT_OTHER): Payer: Self-pay

## 2021-06-15 ENCOUNTER — Other Ambulatory Visit: Payer: Self-pay | Admitting: Family Medicine

## 2021-06-15 DIAGNOSIS — I10 Essential (primary) hypertension: Secondary | ICD-10-CM

## 2021-06-15 MED ORDER — AMLODIPINE BESYLATE 10 MG PO TABS
5.0000 mg | ORAL_TABLET | Freq: Every day | ORAL | 1 refills | Status: DC
Start: 2021-06-15 — End: 2021-07-17
  Filled 2021-06-15: qty 45, 90d supply, fill #0

## 2021-06-15 MED ORDER — TAMSULOSIN HCL 0.4 MG PO CAPS
0.4000 mg | ORAL_CAPSULE | Freq: Every day | ORAL | 1 refills | Status: DC
  Filled 2021-06-15 – 2021-06-19 (×2): qty 90, 90d supply, fill #0
  Filled 2021-08-29: qty 90, 90d supply, fill #1

## 2021-06-19 ENCOUNTER — Other Ambulatory Visit (HOSPITAL_COMMUNITY): Payer: Self-pay

## 2021-06-19 ENCOUNTER — Other Ambulatory Visit (HOSPITAL_BASED_OUTPATIENT_CLINIC_OR_DEPARTMENT_OTHER): Payer: Self-pay

## 2021-06-21 ENCOUNTER — Other Ambulatory Visit (HOSPITAL_BASED_OUTPATIENT_CLINIC_OR_DEPARTMENT_OTHER): Payer: Self-pay

## 2021-06-25 ENCOUNTER — Other Ambulatory Visit (HOSPITAL_COMMUNITY): Payer: Self-pay

## 2021-06-25 ENCOUNTER — Other Ambulatory Visit (HOSPITAL_BASED_OUTPATIENT_CLINIC_OR_DEPARTMENT_OTHER): Payer: Self-pay

## 2021-06-26 ENCOUNTER — Other Ambulatory Visit (HOSPITAL_BASED_OUTPATIENT_CLINIC_OR_DEPARTMENT_OTHER): Payer: Self-pay

## 2021-06-26 ENCOUNTER — Other Ambulatory Visit (HOSPITAL_COMMUNITY): Payer: Self-pay

## 2021-07-03 ENCOUNTER — Other Ambulatory Visit: Payer: Self-pay

## 2021-07-03 ENCOUNTER — Ambulatory Visit (INDEPENDENT_AMBULATORY_CARE_PROVIDER_SITE_OTHER): Payer: No Typology Code available for payment source | Admitting: Family Medicine

## 2021-07-03 VITALS — Temp 98.0°F | Ht 69.0 in | Wt 214.0 lb

## 2021-07-03 DIAGNOSIS — Z23 Encounter for immunization: Secondary | ICD-10-CM

## 2021-07-03 NOTE — Progress Notes (Signed)
Medical screening examination/treatment was performed by qualified clinical staff member and as supervising physician I was immediately available for consultation/collaboration. I have reviewed documentation and agree with assessment and plan.  Deshayla Empson, DO  

## 2021-07-03 NOTE — Progress Notes (Deleted)
Cardiology Office Note:    Date:  07/03/2021   ID:  Brandon [name], DOB 1956/05/06, MRN [id number]  PCP:  Luetta Nutting, [facility]  Cardiologist:  Freada Bergeron, MD  Advanced Practice Provider:  No care team member to display Electrophysiologist:  None   AP:[id number]   Referring MD: Luetta Nutting, DO     History of Present Illness:    Brandon [name] is a 65 y.o. male with a hx of HTN, HLD, DMII, and CAD s/p recent PCI to RCA who presents to clinic for follow-up.  Patient was admitted to the hospital on 08/11/2020 with chest pain.  CTA of the chest showed no evidence of aortic dissection or aneurysm.  Respiratory panel negative for influenza and Covid.  D-dimer negative.  Troponin negative x2.  TTE 08/24/2020 showed EF 60 to 65%, no significant valve issue.  Given the atypical nature of his symptoms, he underwent coronary CT on 08/14/2020 which showed coronary calcium score of 1201 which placed the patient in 95th percentile for age and sex matched control, diffuse CAD with beadlike diabetic pattern with suspicion for severe stenosis in the mid RCA, ostial D2 and proximal left circumflex artery.  He subsequently underwent cardiac catheterization on 08/15/2020 which showed 90% mid RCA lesion, 75% proximal left circumflex artery, 75% OM 2, 100% OM1 with faint left to left and right-to-left collaterals, 70% RPDA, 75% D2, 30% distal LAD lesion.  The 90% mid RCA lesion was treated successfully with a 4.0 x 38 mm resolute DES.  The small disease in RPDA, OM1, OM2 at the diagonal vessel was managed medically. Postprocedure, he continued to have chest discomfort.  A bedside echocardiogram obtained on 08/15/2020 did not reveal any pericardial effusion, there was no change in LV function.  He was treated with colchicine 0.6 mg twice a day, colchicine was subsequently discontinued due to lack of recurrent symptoms.  Hemoglobin A1c obtained during this admission was 10,  he has been advised to follow-up with his primary care provider.  During last visit on 12/04/20, the patient was having significant DOE in the setting of known significant Lcx disease.He failed antianginal medication up-titration and therefore went for cath on 01/17/21 which revealed severe prox Lcx stenosis, severe mid Lcx stenosis and chronic occlusion of OM1. Proximal RCA stent was patent. Severe disease in PDA (small caliber) unchanged from last cath. He received PCI with DES to prox Lcx and mid Lcx.   Was last seen in clinic [place], [name] on 02/09/21 where he was feeling better with improved exercise tolerance.No further chest pain.   Today, ***   There were no vitals filed for this visit.    Past Medical History:  Diagnosis Date   Anxiety    Basal cell carcinoma (BCC) in situ of skin    Colon polyps    Coronary artery disease    S/p DES to mRCA in 12/21 // S/p DES to LCx x 2 in 12/2020 // Diff dz in small to mod Dx (not a good target for PCI); OM1 100 CTO; severe dz in small OM2 and PDA >> Med Rx   Depression    Diabetes mellitus without complication (HCC)    HLD (hyperlipidemia)    Hypertension    SBO (small bowel obstruction) ([place])     Past Surgical History:  Procedure Laterality Date   BACK SURGERY     CARDIAC CATHETERIZATION     CATARACT EXTRACTION W/ INTRAOCULAR LENS  IMPLANT, BILATERAL     CHOLECYSTECTOMY     CHOLECYSTECTOMY, LAPAROSCOPIC     CORONARY STENT INTERVENTION N/A 08/15/2020   Procedure: CORONARY STENT INTERVENTION;  Surgeon: Jettie Booze, MD;  Location: [facility];  Service: Cardiovascular;  Laterality: N/A;   CORONARY STENT INTERVENTION N/A 01/17/2021   Procedure: CORONARY STENT INTERVENTION;  Surgeon: Burnell Blanks, MD;  Location: [facility];  Service: Cardiovascular;  Laterality: N/A;   EYE SURGERY     INTRAVASCULAR ULTRASOUND/IVUS N/A 08/15/2020   Procedure: Intravascular Ultrasound/IVUS;  Surgeon: Jettie Booze, MD;  Location: [facility];  Service: Cardiovascular;  Laterality: N/A;   LEFT HEART CATH AND CORONARY ANGIOGRAPHY N/A 08/15/2020   Procedure: LEFT HEART CATH AND CORONARY ANGIOGRAPHY;  Surgeon: Jettie Booze, MD;  Location: [place];  Service: Cardiovascular;  Laterality: N/A;   LEFT HEART CATH AND CORONARY ANGIOGRAPHY N/A 01/17/2021   Procedure: LEFT HEART CATH AND CORONARY ANGIOGRAPHY;  Surgeon: Burnell Blanks, MD;  Location: [place];  Service: Cardiovascular;  Laterality: N/A;   LUMBAR FUSION     L3-5 with rods   XI ROBOTIC ASSISTED INGUINAL HERNIA REPAIR WITH MESH     x 3 surgeries    Current Medications: No outpatient medications have been marked as taking for the 07/17/21 encounter (Appointment) with Freada Bergeron, MD.     Allergies:   Kiwi extract, Other, Penicillins, Peach [prunus persica], Ancef [cefazolin], Latex, and Levaquin [levofloxacin]   Social History   Socioeconomic History   Marital status: Married    Spouse name: Not on file   Number of children: 4   Years of education: 16   Highest education level: Not on file  Occupational History   Occupation: disabled  Tobacco Use   Smoking status: Former    Types: Cigarettes    Quit date: 2007    Years since quitting: 15.8   Smokeless tobacco: Never  Vaping Use   Vaping Use: Not on file  Substance and Sexual Activity   Alcohol use: Not Currently   Drug use: Not Currently   Sexual activity: Yes  Other Topics Concern   Not on file  Social History Narrative   Not on file   Social Determinants of Health   Financial Resource Strain: Not on file  Food Insecurity: Not on file  Transportation Needs: Not on file  Physical Activity: Not on file  Stress: Not on file  Social Connections: Not on file     Family History: The patient's family history includes Anxiety disorder in his father and mother; COPD in his mother; Cancer in his father; Depression in  his father and mother; Other in his father. There is no history of Diabetes.  ROS:   Please see the history of present illness. (+)Exertional shortness of breath All other systems reviewed and are negative.  EKGs/Labs/Other Studies Reviewed:    The following studies were reviewed today: Cath 01/17/21: Left heart cath 01/17/21: 2nd Diag-2 lesion is 75% stenosed. 2nd Diag-1 lesion is 75% stenosed. Dist LAD lesion is 50% stenosed. 2nd Mrg lesion is 75% stenosed. 1st Mrg lesion is 100% stenosed. RPDA lesion is 70% stenosed. Previously placed Mid RCA stent (unknown type) is widely patent. Dist RCA lesion is 50% stenosed. Ost RCA to Prox RCA lesion is 30% stenosed. Mid Cx lesion is 90% stenosed. Ost Cx to Prox Cx lesion is 75% stenosed. A drug-eluting stent was successfully placed using a STENT RESOLUTE ONYX  3.5X15. Post intervention, there is a 0% residual stenosis. A drug-eluting stent was successfully placed using a STENT RESOLUTE ONYX 2.25X8. Post intervention, there is a 0% residual stenosis.   1. Non-obstructive disease in the LAD. Diffuse disease in the small to moderate caliber Diagonal branch, unchanged from last cath and not a good target for PCI.  2. Severe proximal Circumflex stenosis. Severe mid Circumflex stenosis. Chronic occlusion OM1. Severe diffuse disease in the small caliber second OM branch.  4. Patent proximal to mid RCA stent. Severe disease in the small caliber PDA, unchanged from last cath.  5. Successful PTCA/DES x 1 proximal Circumflex 6. Successful PTCA/DES x 1 mid Circumflex   Echo 08/13/2020  1. Left ventricular ejection fraction, by estimation, is 60 to 65%. The  left ventricle has normal function. The left ventricle has no regional  wall motion abnormalities. There is mild left ventricular hypertrophy.  Left ventricular diastolic parameters  are consistent with Grade I diastolic dysfunction (impaired relaxation).   2. Right ventricular systolic function  is normal. The right ventricular  size is normal.   3. The mitral valve is normal in structure. No evidence of mitral valve  regurgitation. No evidence of mitral stenosis.   4. The aortic valve is tricuspid. Aortic valve regurgitation is not  visualized. No aortic stenosis is present.   5. The inferior vena cava is normal in size with greater than 50%  respiratory variability, suggesting right atrial pressure of 3 mmHg.  Cath 08/15/20: Mid RCA lesion is 90% stenosed. Prox Cx lesion is 75% stenosed. 2nd Mrg lesion is 75% stenosed. 1st Mrg lesion is 100% stenosed. Faint right to left and left to left collaterals. RPDA lesion is 70% stenosed. 2nd Diag-1 lesion is 75% stenosed. 2nd Diag-2 lesion is 75% stenosed. Dist LAD lesion is 50% stenosed. A drug-eluting stent was successfully placed using a STENT RESOLUTE ONYX 4.0X38, optimized with IVUS. Post intervention, there is a 0% residual stenosis. LV end diastolic pressure is normal. There is no aortic valve stenosis. 75 cm long sheath needed for support due to tortuosity in the right subclavian to engage the RCA. Left coronary could be engaged easily with short sheath.   Diffuse multivessel disease, but no LAD involvement.     RCA successfully treated.  Small vessel disease noted in the RPDA, OM1 and OM2, second diagonal and apical LAD.  Proximal circumflex would be a potential target for PCI if he had more symptoms.   CTA 08/14/20: 1. Left Main: 0.98.   2. LAD: Proximal: 0.97, distal: 0.87. 3. D1: Proximal: 0.75. 4. LCX: Proximal; 0.94, mid: Occluded. 5. RCA: Proximal: 0.98, distal: 0.77.   IMPRESSION: 1. CT FFR analysis showed significant stenoses in proximal RCA, ostial portion of a large D1 and occluded proximal portion of LCX artery. Left cardiac catheterization is recommended.   FINDINGS: A 100 kV prospective scan was triggered in the descending thoracic aorta at 111 HU's. Axial non-contrast 3 mm slices were carried  out through the heart. The data set was analyzed on a dedicated work station and scored using the [place]. Gantry rotation speed was 250 msecs and collimation was .6 mm. 100 mg of PO Metoprolol and 0.8 mg of sl NTG were given. The 3D data set was reconstructed in 5% intervals of the 67-82 % of the R-R cycle. Diastolic phases were analyzed on a dedicated work station using MPR, MIP and VRT modes. The patient received 80 cc of contrast.   Aorta: Normal size. Mild diffuse  atherosclerotic plaque and calcifications. No dissection.   Aortic Valve:  Trileaflet.  No calcifications.   Coronary Arteries:  Normal coronary origin.  Right dominance.   RCA is a large dominant artery that gives rise to PDA and PLA. There is mild calcified plaque in the proximal RCA with stenosis 25-49%. Mid RCA has severe diffuse predominantly calcified plaque with a focal stenosis suspicious for > 70%. Distal RCA has moderate calcified plaque prior to bifurcation into PLA and PDA with stenosis 50-69%.   PLA is small with diffuse moderate plaque.   PDA is a medium caliber artery with diffuse moderate plaque in a bead like pattern.   Left main is a large artery that gives rise to LAD and LCX arteries. Left main is a long artery with minimal calcified plaque and stenosis 0-25%.   LAD is a large vessel that gives rise to two diagonal arteries. There is mild diffuse calcified plaque with focal stenoses in the proximal and mid LAD of 25-49%.   D1 is very small.   D2 is a medium caliber artery (2.1 mm) that has severe mixed ostial plaque with lipid rich core and is suspicious for stenosis > 70%.   LCX is a large caliber non-dominant artery that gives rise to two OM branches. Proximal LCX artery has severe non-calcified plaque suspicious for stenosis > 70%, this is followed by a moderate diffuse calcified plaque with stenosis 50-69%. Mid and distal LCX artery has small lumen and mild diffuse plaque.    OM1 is very small and has severe diffuse plaque.   OM2 is a medium caliber artery with diffuse moderate plaque in a bead like pattern.   Other findings:   Normal pulmonary vein drainage into the left atrium.   Normal left atrial appendage without a thrombus.   Normal size of the pulmonary artery.   IMPRESSION: 1. Coronary calcium score of 1201. This was 95 percentile for age and sex matched control.   2. Normal coronary origin with right dominance.   3. CAD-RADS 4 Severe stenosis. (70-99%). Diffuse CAD in a bead-like diabetic pattern, there is suspicion for severe stenoses in the mid RCA, ostial portion of a large D2 and proximal portion of LCX artery. Additional analysis with CT FFR will be submitted. We will tentatively plan for a left cardiac catheterization for tomorrow.   EKG:   4//11/22- EKG not ordered today   Recent Labs: 08/13/2020: TSH 2.345 10/23/2020: ALT 12 01/18/2021: BUN 12; Creatinine, Ser 0.96; Hemoglobin 12.8; Platelets 361; Potassium 3.9; Sodium 133  Recent Lipid Panel    Component Value Date/Time   CHOL 113 10/23/2020 0824   TRIG 74 10/23/2020 0824   HDL 45 10/23/2020 0824   CHOLHDL 2.5 10/23/2020 0824   CHOLHDL 5.0 08/13/2020 0119   VLDL 52 (H) 08/13/2020 0119   LDLCALC 53 10/23/2020 0824     Physical Exam:    VS:  There were no vitals taken for this visit.    Wt Readings from Last 3 Encounters:  07/03/21 214 lb (97.1 kg)  05/29/21 214 lb (97.1 kg)  05/28/21 205 lb (93 kg)     GEN: Well nourished, well developed in no acute distress HEENT: Normal NECK: No JVD; No carotid bruits CARDIAC: RRR, no murmurs, rubs, gallops RESPIRATORY:  Clear to auscultation without rales, wheezing or rhonchi  ABDOMEN: Soft, non-tender, non-distended MUSCULOSKELETAL:  Trace pedal edema; No deformity  SKIN: Warm and dry NEUROLOGIC:  Alert and oriented x 3 PSYCHIATRIC:  Normal affect  ASSESSMENT:    No diagnosis found.  PLAN:    In order of  problems listed above:  #Multivessel CAD s/p PCI to RCA and recent PCI x2 to Lcx: Patient with PCI to RCA with residual LCx 75%, 50% LAD,  100% OM1,  75% OM2, 75% D1 and D2 in 07/2020 with recurrent angina now s/p PCI to prox and mid Lcx on 01/17/21. Now with significant improvement of symptoms.  -Continue ASA 81mg  daily -Continue plavix 75mg  daily -Continue lipitor 80mg  daily -Continue metop 50mg  XL -Continue imdur 30mg  daily -Continue losartan 25mg  daily if SBP >110 -Continue nitro as needed for chest pain  #HTN: -Continue amlodipine 5mg  daily -Continue metop 50mg  XL -Continue imdur 30mg  daily -Continue losartan 25mg  daily   #DMII: A1C significantly improved from 10-->6.5 -Continue insulin and jardiance  #HLD: -Continue lipitor 80mg  daily -Goal LDL<70  #Obesity: Significantly improved with BMI 35-->31.  -Mediterranean diet -Continue moderate exercise with daily walking (recommended per day)  #Known OSA not on CPAP: Patient with history of OSA now back on CPAP. -Continue CPAP  Medication Adjustments/Labs and Tests Ordered: Current medicines are reviewed at length with the patient today.  Concerns regarding medicines are outlined above.  No orders of the defined types were placed in this encounter.  No orders of the defined types were placed in this encounter.   There are no Patient Instructions on file for this visit.      Signed, , MD  07/03/2021 8:57 PM    [facility]

## 2021-07-03 NOTE — Progress Notes (Signed)
Pt presented for flu and pneumonia vaccine.  Tolerated well.   Location: [facility]

## 2021-07-09 ENCOUNTER — Other Ambulatory Visit (HOSPITAL_BASED_OUTPATIENT_CLINIC_OR_DEPARTMENT_OTHER): Payer: Self-pay

## 2021-07-10 ENCOUNTER — Other Ambulatory Visit (HOSPITAL_BASED_OUTPATIENT_CLINIC_OR_DEPARTMENT_OTHER): Payer: Self-pay

## 2021-07-12 ENCOUNTER — Telehealth: Payer: Self-pay | Admitting: Cardiology

## 2021-07-12 NOTE — Telephone Encounter (Signed)
Brandon [name] is call about a prior-auth that was suppose to be obtain for the patient's sleep study.

## 2021-07-13 ENCOUNTER — Other Ambulatory Visit (HOSPITAL_BASED_OUTPATIENT_CLINIC_OR_DEPARTMENT_OTHER): Payer: Self-pay

## 2021-07-13 MED ORDER — BUPRENORPHINE 7.5 MCG/HR TD PTWK
MEDICATED_PATCH | TRANSDERMAL | 0 refills | Status: DC
  Filled 2021-07-13: qty 4, 28d supply, fill #0

## 2021-07-16 ENCOUNTER — Telehealth: Payer: Self-pay | Admitting: *Deleted

## 2021-07-16 NOTE — Telephone Encounter (Signed)
Called [employer] and done a retro authorization for 05/28/21 BIPAP titration. Auth # [id number]

## 2021-07-17 ENCOUNTER — Other Ambulatory Visit: Payer: Self-pay

## 2021-07-17 ENCOUNTER — Other Ambulatory Visit (HOSPITAL_BASED_OUTPATIENT_CLINIC_OR_DEPARTMENT_OTHER): Payer: Self-pay

## 2021-07-17 ENCOUNTER — Encounter: Payer: Self-pay | Admitting: Cardiology

## 2021-07-17 ENCOUNTER — Ambulatory Visit (INDEPENDENT_AMBULATORY_CARE_PROVIDER_SITE_OTHER): Payer: No Typology Code available for payment source | Admitting: Cardiology

## 2021-07-17 VITALS — BP 120/62 | HR 83 | Ht 69.0 in | Wt 205.6 lb

## 2021-07-17 DIAGNOSIS — E119 Type 2 diabetes mellitus without complications: Secondary | ICD-10-CM

## 2021-07-17 DIAGNOSIS — E782 Mixed hyperlipidemia: Secondary | ICD-10-CM

## 2021-07-17 DIAGNOSIS — G4733 Obstructive sleep apnea (adult) (pediatric): Secondary | ICD-10-CM

## 2021-07-17 DIAGNOSIS — E669 Obesity, unspecified: Secondary | ICD-10-CM

## 2021-07-17 DIAGNOSIS — I2510 Atherosclerotic heart disease of native coronary artery without angina pectoris: Secondary | ICD-10-CM | POA: Diagnosis not present

## 2021-07-17 DIAGNOSIS — I2089 Other forms of angina pectoris: Secondary | ICD-10-CM

## 2021-07-17 DIAGNOSIS — I208 Other forms of angina pectoris: Secondary | ICD-10-CM

## 2021-07-17 DIAGNOSIS — I10 Essential (primary) hypertension: Secondary | ICD-10-CM

## 2021-07-17 DIAGNOSIS — Z794 Long term (current) use of insulin: Secondary | ICD-10-CM

## 2021-07-17 MED ORDER — CLOPIDOGREL BISULFATE 75 MG PO TABS
ORAL_TABLET | Freq: Every day | ORAL | 3 refills | Status: DC
  Filled 2021-07-17: qty 90, fill #0
  Filled 2021-08-29: qty 90, 90d supply, fill #0
  Filled 2021-12-02: qty 90, 90d supply, fill #1
  Filled 2022-02-27: qty 90, 90d supply, fill #2
  Filled 2022-06-04: qty 90, 90d supply, fill #3

## 2021-07-17 MED ORDER — ISOSORBIDE MONONITRATE ER 30 MG PO TB24
30.0000 mg | ORAL_TABLET | Freq: Every day | ORAL | 2 refills | Status: DC
  Filled 2021-07-17 – 2021-08-29 (×2): qty 90, 90d supply, fill #0
  Filled 2021-12-02: qty 90, 90d supply, fill #1
  Filled 2022-02-27: qty 90, 90d supply, fill #2

## 2021-07-17 MED ORDER — METOPROLOL SUCCINATE ER 50 MG PO TB24
50.0000 mg | ORAL_TABLET | Freq: Every day | ORAL | 2 refills | Status: DC
  Filled 2021-07-17 – 2021-08-29 (×2): qty 90, 90d supply, fill #0

## 2021-07-17 MED ORDER — ATORVASTATIN CALCIUM 80 MG PO TABS
ORAL_TABLET | Freq: Every day | ORAL | 3 refills | Status: DC
  Filled 2021-07-17: qty 90, fill #0
  Filled 2021-08-29: qty 90, 90d supply, fill #0
  Filled 2021-12-02: qty 90, 90d supply, fill #1

## 2021-07-17 MED ORDER — AMLODIPINE BESYLATE 10 MG PO TABS
5.0000 mg | ORAL_TABLET | Freq: Every day | ORAL | 3 refills | Status: DC
  Filled 2021-07-17 – 2021-09-03 (×3): qty 45, 90d supply, fill #0

## 2021-07-17 NOTE — Patient Instructions (Signed)

## 2021-07-17 NOTE — Progress Notes (Signed)
Cardiology Office Note:    Date:  07/17/2021   ID:  Brandon [name], DOB 1956/03/12, MRN [id number]  PCP:  Brandon [name], [facility]  Cardiologist:  Brandon Bergeron, MD  Advanced Practice Provider:  No care team member to display Electrophysiologist:  None   :[id number]}   Referring MD: Brandon Nutting, DO     History of Present Illness:    Brandon [name] is a 65 y.o. male with a hx of HTN, HLD, DMII, and CAD s/p recent PCI to RCA who presents to clinic for follow-up.  Patient was admitted to the hospital on 08/11/2020 with chest pain.  CTA of the chest showed no evidence of aortic dissection or aneurysm.  Respiratory panel negative for influenza and Covid.  D-dimer negative.  Troponin negative x2.  TTE 08/24/2020 showed EF 60 to 65%, no significant valve issue.  Given the atypical nature of his symptoms, he underwent coronary CT on 08/14/2020 which showed coronary calcium score of 1201 which placed the patient in 95th percentile for age and sex matched control, diffuse CAD with beadlike diabetic pattern with suspicion for severe stenosis in the mid RCA, ostial D2 and proximal left circumflex artery.  He subsequently underwent cardiac catheterization on 08/15/2020 which showed 90% mid RCA lesion, 75% proximal left circumflex artery, 75% OM 2, 100% OM1 with faint left to left and right-to-left collaterals, 70% RPDA, 75% D2, 30% distal LAD lesion.  The 90% mid RCA lesion was treated successfully with a 4.0 x 38 mm resolute DES.  The small disease in RPDA, OM1, OM2 at the diagonal vessel was managed medically. Postprocedure, he continued to have chest discomfort.  A bedside echocardiogram obtained on 08/15/2020 did not reveal any pericardial effusion, there was no change in LV function.  He was treated with colchicine 0.6 mg twice a day, colchicine was subsequently discontinued due to lack of recurrent symptoms.  Hemoglobin A1c obtained during this admission was 10,  he has been advised to follow-up with his primary care provider.  During last visit on 12/04/20, the patient was having significant DOE in the setting of known significant Lcx disease.He failed antianginal medication up-titration and therefore went for cath on 01/17/21 which revealed severe prox Lcx stenosis, severe mid Lcx stenosis and chronic occlusion of OM1. Proximal RCA stent was patent. Severe disease in PDA (small caliber) unchanged from last cath. He received PCI with DES to prox Lcx and mid Lcx.   Was last seen in clinic [name], [name] on 02/09/21 where he was feeling better with improved exercise tolerance.No further chest pain.   Today, he is doing well. He has been able to lose 30 lbs since June with exercise and diet. His goal weight is 185 lbs and he is almost there with a current weight of 200 lbs on home scale.  At home, he records his blood pressure and reports measurements of 967-893Y systolic on average. He is compliant wit his prescriptions. No chest pain since his last PCI and has not had to use nitro. The patient is active and goes to the gym 3 days a week without anginal symptoms. States he stopped his losartan because it made him too lightheaded.  He denies any palpitations, chest pain, lightheadedness, headaches, syncope, orthopnea, PND, lower extremity edema.  Filed Weights   07/17/21 1058  Weight: 205 lb 9.6 oz (93.3 kg)    Past Medical History:  Diagnosis Date   Anxiety    Basal cell  carcinoma (BCC) in situ of skin    Colon polyps    Coronary artery disease    S/p DES to mRCA in 12/21 // S/p DES to LCx x 2 in 12/2020 // Diff dz in small to mod Dx (not a good target for PCI); OM1 100 CTO; severe dz in small OM2 and PDA >> Med Rx   Depression    Diabetes mellitus without complication ([place])    HLD (hyperlipidemia)    Hypertension    SBO (small bowel obstruction) ([place])     Past Surgical History:  Procedure Laterality Date   BACK SURGERY     CARDIAC  CATHETERIZATION     CATARACT EXTRACTION W/ INTRAOCULAR LENS  IMPLANT, BILATERAL     CHOLECYSTECTOMY     CHOLECYSTECTOMY, LAPAROSCOPIC     CORONARY STENT INTERVENTION N/A 08/15/2020   Procedure: CORONARY STENT INTERVENTION;  Surgeon: Brandon Booze, MD;  Location: [facility];  Service: Cardiovascular;  Laterality: N/A;   CORONARY STENT INTERVENTION N/A 01/17/2021   Procedure: CORONARY STENT INTERVENTION;  Surgeon: Brandon Blanks, MD;  Location: [facility];  Service: Cardiovascular;  Laterality: N/A;   EYE SURGERY     INTRAVASCULAR ULTRASOUND/IVUS N/A 08/15/2020   Procedure: Intravascular Ultrasound/IVUS;  Surgeon: Brandon Booze, MD;  Location: [facility] [facility];  Service: Cardiovascular;  Laterality: N/A;   LEFT HEART CATH AND CORONARY ANGIOGRAPHY N/A 08/15/2020   Procedure: LEFT HEART CATH AND CORONARY ANGIOGRAPHY;  Surgeon: Brandon Booze, MD;  Location: [facility];  Service: Cardiovascular;  Laterality: N/A;   LEFT HEART CATH AND CORONARY ANGIOGRAPHY N/A 01/17/2021   Procedure: LEFT HEART CATH AND CORONARY ANGIOGRAPHY;  Surgeon: Brandon Blanks, MD;  Location: [facility];  Service: Cardiovascular;  Laterality: N/A;   LUMBAR FUSION     L3-5 with rods   XI ROBOTIC ASSISTED INGUINAL HERNIA REPAIR WITH MESH     x 3 surgeries    Current Medications: Current Meds  Medication Sig   ARIPiprazole (ABILIFY) 10 MG tablet Take 10 mg by mouth every morning.   aspirin 81 MG EC tablet Take 81 mg by mouth every morning.   Blood Glucose Monitoring Suppl (FREESTYLE LITE) w/Device KIT USE AS DIRECTED.   Blood Pressure Monitoring (OMRON 3 SERIES BP MONITOR) DEVI USE AS DIRECTED.   buprenorphine (BUTRANS) 7.5 MCG/HR Apply 1 patch to skin once a week   buPROPion (WELLBUTRIN XL) 150 MG 24 hr tablet Take 1 tablet (150 mg total) by mouth daily.   buPROPion (WELLBUTRIN XL) 300 MG 24 hr tablet Take 1 tablet (300 mg total) by mouth daily.    Continuous Blood Gluc Sensor (DEXCOM G6 SENSOR) MISC Use as directed. Change sensor every 10 days   empagliflozin (JARDIANCE) 25 MG TABS tablet Take 1 tablet (25 mg total) by mouth daily.   glucose blood test strip USE 1 TO CHECK BLOOD GLUCOSE UP TO 5 TIMES DAILY. (Patient taking differently: USE 1 TO CHECK BLOOD GLUCOSE UP TO 5 TIMES DAILY.)   insulin lispro (HUMALOG) 100 UNIT/ML KwikPen Inject 24 Units into the skin 3 (three) times daily before meals.   Lancets (FREESTYLE) lancets USE 1 TO CHECK BLOOD GLUCOSE UP TO 5 TIMES DAILY. (Patient taking differently: USE 1 TO CHECK BLOOD GLUCOSE UP TO 5 TIMES DAILY.)   losartan (COZAAR) 25 MG tablet Take 1 tablet (25 mg total) by mouth daily. Take only when systolic BP greater than 790.   metoCLOPramide (REGLAN) 10 MG  tablet Take 10 mg by mouth daily as needed for nausea or vomiting (upset stomach).   nystatin (MYCOSTATIN) 100000 UNIT/ML suspension Use as directed 5 mLs (500,000 Units total) in the mouth or throat 4 (four) times daily for 7 days.   ondansetron (ZOFRAN ODT) 4 MG disintegrating tablet Take 1 tablet (4 mg total) by mouth every 8 (eight) hours as needed for nausea or vomiting.   pantoprazole (PROTONIX) 40 MG tablet Take 1 tablet (40 mg total) by mouth daily.   prednisoLONE-lidocaine-nystatin-diphenhydrAMINE-alum & mag hydroxide-simeth Swish and spit 15ms 3 times daily as needed for mouth pain.   Semaglutide, 2 MG/DOSE, (OZEMPIC, 2 MG/DOSE,) 8 MG/3ML SOPN Inject 2 mg into the skin once a week.   tamsulosin (FLOMAX) 0.4 MG CAPS capsule Take 1 capsule (0.4 mg total) by mouth daily.   testosterone cypionate (DEPOTESTOSTERONE CYPIONATE) 200 MG/ML injection Inject 1 mL (200 mg total) into the muscle every Sunday.   tiZANidine (ZANAFLEX) 4 MG tablet Take 2 tablets (8 mg total) by mouth 3 (three) times daily.   traMADol (ULTRAM) 50 MG tablet Take 2 tablets (100 mg total) by mouth 3 (three) times daily as needed.   traZODone (DESYREL) 50 MG tablet TAKE  1-2 TABLETS (50-100 MG TOTAL) BY MOUTH AT BEDTIME AS NEEDED FOR SLEEP.   [DISCONTINUED] amLODipine (NORVASC) 10 MG tablet Take 1/2 tablet (5 mg total) by mouth daily.   [DISCONTINUED] atorvastatin (LIPITOR) 80 MG tablet TAKE 1 TABLET (80 MG TOTAL) BY MOUTH DAILY.   [DISCONTINUED] clopidogrel (PLAVIX) 75 MG tablet TAKE 1 TABLET (75 MG TOTAL) BY MOUTH DAILY WITH BREAKFAST.   [DISCONTINUED] isosorbide mononitrate (IMDUR) 30 MG 24 hr tablet Take 1 tablet (30 mg total) by mouth daily.   [DISCONTINUED] metoprolol succinate (TOPROL-XL) 50 MG 24 hr tablet Take 1 tablet (50 mg total) by mouth daily. Take with or immediately following a meal.     Allergies:   Kiwi extract, Other, Penicillins, Peach [prunus persica], Ancef [cefazolin], Latex, and Levaquin [levofloxacin]   Social History   Socioeconomic History   Marital status: Married    Spouse name: Not on file   Number of children: 4   Years of education: 16   Highest education level: Not on file  Occupational History   Occupation: disabled  Tobacco Use   Smoking status: Former    Types: Cigarettes    Quit date: 2007    Years since quitting: 15.9   Smokeless tobacco: Never  Vaping Use   Vaping Use: Not on file  Substance and Sexual Activity   Alcohol use: Not Currently   Drug use: Not Currently   Sexual activity: Yes  Other Topics Concern   Not on file  Social History Narrative   Not on file   Social Determinants of Health   Financial Resource Strain: Not on file  Food Insecurity: Not on file  Transportation Needs: Not on file  Physical Activity: Not on file  Stress: Not on file  Social Connections: Not on file     Family History: The patient's family history includes Anxiety disorder in his father and mother; COPD in his mother; Cancer in his father; Depression in his father and mother; Other in his father. There is no history of Diabetes.  ROS:   Please see the history of present illness. Review of Systems   Constitutional:  Negative for chills and fever.  HENT:  Negative for congestion and sore throat.   Eyes:  Positive for blurred vision. Negative for discharge.  Respiratory:  Positive for shortness of breath. Negative for cough.   Cardiovascular:  Negative for chest pain, palpitations, orthopnea, claudication, leg swelling and PND.  Gastrointestinal:  Negative for nausea and vomiting.  Genitourinary:  Negative for dysuria and urgency.  Musculoskeletal:  Negative for myalgias and neck pain.  Skin:  Negative for itching and rash.  Neurological:  Positive for dizziness.  Endo/Heme/Allergies:  Negative for polydipsia. Does not bruise/bleed easily.  Psychiatric/Behavioral:  The patient is not nervous/anxious and does not have insomnia.   All other systems reviewed and are negative.  EKGs/Labs/Other Studies Reviewed:    The following studies were reviewed today: Cath 01/17/21: Left heart cath 01/17/21: 2nd Diag-2 lesion is 75% stenosed. 2nd Diag-1 lesion is 75% stenosed. Dist LAD lesion is 50% stenosed. 2nd Mrg lesion is 75% stenosed. 1st Mrg lesion is 100% stenosed. RPDA lesion is 70% stenosed. Previously placed Mid RCA stent (unknown type) is widely patent. Dist RCA lesion is 50% stenosed. Ost RCA to Prox RCA lesion is 30% stenosed. Mid Cx lesion is 90% stenosed. Ost Cx to Prox Cx lesion is 75% stenosed. A drug-eluting stent was successfully placed using a STENT RESOLUTE ONYX 3.5X15. Post intervention, there is a 0% residual stenosis. A drug-eluting stent was successfully placed using a STENT RESOLUTE ONYX 2.25X8. Post intervention, there is a 0% residual stenosis.   1. Non-obstructive disease in the LAD. Diffuse disease in the small to moderate caliber Diagonal branch, unchanged from last cath and not a good target for PCI.  2. Severe proximal Circumflex stenosis. Severe mid Circumflex stenosis. Chronic occlusion OM1. Severe diffuse disease in the small caliber second OM branch.  4.  Patent proximal to mid RCA stent. Severe disease in the small caliber PDA, unchanged from last cath.  5. Successful PTCA/DES x 1 proximal Circumflex 6. Successful PTCA/DES x 1 mid Circumflex   Echo 08/13/2020  1. Left ventricular ejection fraction, by estimation, is 60 to 65%. The  left ventricle has normal function. The left ventricle has no regional  wall motion abnormalities. There is mild left ventricular hypertrophy.  Left ventricular diastolic parameters  are consistent with Grade I diastolic dysfunction (impaired relaxation).   2. Right ventricular systolic function is normal. The right ventricular  size is normal.   3. The mitral valve is normal in structure. No evidence of mitral valve  regurgitation. No evidence of mitral stenosis.   4. The aortic valve is tricuspid. Aortic valve regurgitation is not  visualized. No aortic stenosis is present.   5. The inferior vena cava is normal in size with greater than 50%  respiratory variability, suggesting right atrial pressure of 3 mmHg.  Cath 08/15/20: Mid RCA lesion is 90% stenosed. Prox Cx lesion is 75% stenosed. 2nd Mrg lesion is 75% stenosed. 1st Mrg lesion is 100% stenosed. Faint right to left and left to left collaterals. RPDA lesion is 70% stenosed. 2nd Diag-1 lesion is 75% stenosed. 2nd Diag-2 lesion is 75% stenosed. Dist LAD lesion is 50% stenosed. A drug-eluting stent was successfully placed using a STENT RESOLUTE ONYX 4.0X38, optimized with IVUS. Post intervention, there is a 0% residual stenosis. LV end diastolic pressure is normal. There is no aortic valve stenosis. 75 cm long sheath needed for support due to tortuosity in the right subclavian to engage the RCA. Left coronary could be engaged easily with short sheath.   Diffuse multivessel disease, but no LAD involvement.     RCA successfully treated.  Small vessel disease noted in the RPDA, OM1  and OM2, second diagonal and apical LAD.  Proximal circumflex would be  a potential target for PCI if he had more symptoms.   CTA 08/14/20: 1. Left Main: 0.98. 2. LAD: Proximal: 0.97, distal: 0.87. 3. D1: Proximal: 0.75. 4. LCX: Proximal; 0.94, mid: Occluded. 5. RCA: Proximal: 0.98, distal: 0.77.   IMPRESSION: 1. CT FFR analysis showed significant stenoses in proximal RCA, ostial portion of a large D1 and occluded proximal portion of LCX artery. Left cardiac catheterization is recommended.   FINDINGS: A 100 kV prospective scan was triggered in the descending thoracic aorta at 111 HU's. Axial non-contrast 3 mm slices were carried out through the heart. The data set was analyzed on a dedicated work station and scored using the [place]. Gantry rotation speed was 250 msecs and collimation was .6 mm. 100 mg of PO Metoprolol and 0.8 mg of sl NTG were given. The 3D data set was reconstructed in 5% intervals of the 67-82 % of the R-R cycle. Diastolic phases were analyzed on a dedicated work station using MPR, MIP and VRT modes. The patient received 80 cc of contrast.   Aorta: Normal size. Mild diffuse atherosclerotic plaque and calcifications. No dissection.   Aortic Valve:  Trileaflet.  No calcifications.   Coronary Arteries:  Normal coronary origin.  Right dominance.   RCA is a large dominant artery that gives rise to PDA and PLA. There is mild calcified plaque in the proximal RCA with stenosis 25-49%. Mid RCA has severe diffuse predominantly calcified plaque with a focal stenosis suspicious for > 70%. Distal RCA has moderate calcified plaque prior to bifurcation into PLA and PDA with stenosis 50-69%.   PLA is small with diffuse moderate plaque.   PDA is a medium caliber artery with diffuse moderate plaque in a bead like pattern.   Left main is a large artery that gives rise to LAD and LCX arteries. Left main is a long artery with minimal calcified plaque and stenosis 0-25%.   LAD is a large vessel that gives rise to two diagonal  arteries. There is mild diffuse calcified plaque with focal stenoses in the proximal and mid LAD of 25-49%.   D1 is very small.   D2 is a medium caliber artery (2.1 mm) that has severe mixed ostial plaque with lipid rich core and is suspicious for stenosis > 70%.   LCX is a large caliber non-dominant artery that gives rise to two OM branches. Proximal LCX artery has severe non-calcified plaque suspicious for stenosis > 70%, this is followed by a moderate diffuse calcified plaque with stenosis 50-69%. Mid and distal LCX artery has small lumen and mild diffuse plaque.   OM1 is very small and has severe diffuse plaque.   OM2 is a medium caliber artery with diffuse moderate plaque in a bead like pattern.   Other findings:   Normal pulmonary vein drainage into the left atrium.   Normal left atrial appendage without a thrombus.   Normal size of the pulmonary artery.   IMPRESSION: 1. Coronary calcium score of 1201. This was 95 percentile for age and sex matched control.   2. Normal coronary origin with right dominance.   3. CAD-RADS 4 Severe stenosis. (70-99%). Diffuse CAD in a bead-like diabetic pattern, there is suspicion for severe stenoses in the mid RCA, ostial portion of a large D2 and proximal portion of LCX artery. Additional analysis with CT FFR will be submitted. We will tentatively plan for a left cardiac catheterization for tomorrow.  EKG:   07/17/21: EKG was not ordered today 4//11/22- EKG not ordered today   Recent Labs: 08/13/2020: TSH 2.345 10/23/2020: ALT 12 01/18/2021: BUN 12; Creatinine, Ser 0.96; Hemoglobin 12.8; Platelets 361; Potassium 3.9; Sodium 133  Recent Lipid Panel    Component Value Date/Time   CHOL 113 10/23/2020 0824   TRIG 74 10/23/2020 0824   HDL 45 10/23/2020 0824   CHOLHDL 2.5 10/23/2020 0824   CHOLHDL 5.0 08/13/2020 0119   VLDL 52 (H) 08/13/2020 0119   LDLCALC 53 10/23/2020 0824     Physical Exam:    VS:  BP 120/62   Pulse 83    Ht _0  (1.753 m)   Wt 205 lb 9.6 oz (93.3 kg)   SpO2 95%   BMI 30.36 kg/m     Wt Readings from Last 3 Encounters:  07/17/21 205 lb 9.6 oz (93.3 kg)  07/03/21 214 lb (97.1 kg)  05/29/21 214 lb (97.1 kg)     GEN: Well nourished, well developed in no acute distress HEENT: Normal NECK: No JVD; No carotid bruits CARDIAC: RRR, no murmurs, rubs, gallops RESPIRATORY:  Clear to auscultation without rales, wheezing or rhonchi  ABDOMEN: Soft, non-tender, non-distended MUSCULOSKELETAL:  no edema; No deformity  SKIN: Warm and dry NEUROLOGIC:  Alert and oriented x 3 PSYCHIATRIC:  Normal affect   ASSESSMENT:    1. Coronary artery disease involving native coronary artery of native heart without angina pectoris   2. Hypertension, unspecified type   3. OSA (obstructive sleep apnea)   4. Primary hypertension   5. Stable angina pectoris ([place])   6. Essential hypertension   7. Mixed hyperlipidemia   8. Controlled type 2 diabetes mellitus without complication, with long-term current use of insulin (HCC)   9. Obesity (BMI 30-39.9)     PLAN:    In order of problems listed above:  #Multivessel CAD s/p PCI to RCA and recent PCI x2 to Lcx: Patient with PCI to RCA with residual LCx 75%, 50% LAD,  100% OM1,  75% OM2, 75% D1 and D2 in 07/2020 with recurrent angina now s/p PCI to prox and mid Lcx on 01/17/21. Now with significant improvement of symptoms.  -Continue ASA 42m daily -Continue plavix 714mdaily -Continue lipitor 8021maily -Continue metop 83m72m -Continue imdur 30mg43mly -Off losartan due to lightheadedness -Continue nitro as needed for chest pain  #HTN: Very well controlled. -Continue amlodipine 5mg d18my -Continue metop 83mg X44montinue imdur 30mg da1m #DMII: A1C significantly improved from 10-->6.5 -Continue insulin, jardiance and ozempic  #HLD: LDL at goal 53 in 09/2020. -Continue lipitor 80mg dai73mRepeat lipids at next visit -Goal  LDL<70  #Obesity: Significantly improved with BMI 35-->30  -Mediterranean diet -Continue exercise with goal 30min/day4mntinue ozempic  #Known OSA not on CPAP: Patient with history of OSA now back on CPAP. -Continue CPAP  Medication Adjustments/Labs and Tests Ordered: Current medicines are reviewed at length with the patient today.  Concerns regarding medicines are outlined above.  No orders of the defined types were placed in this encounter.  Meds ordered this encounter  Medications   metoprolol succinate (TOPROL-XL) 50 MG 24 hr tablet    Sig: Take 1 tablet (50 mg total) by mouth daily. Take with or immediately following a meal.    Dispense:  90 tablet    Refill:  2   isosorbide mononitrate (IMDUR) 30 MG 24 hr tablet    Sig: Take 1 tablet (30 mg total) by mouth daily.  Dispense:  90 tablet    Refill:  2   clopidogrel (PLAVIX) 75 MG tablet    Sig: TAKE 1 TABLET (75 MG TOTAL) BY MOUTH DAILY WITH BREAKFAST.    Dispense:  90 tablet    Refill:  3   atorvastatin (LIPITOR) 80 MG tablet    Sig: TAKE 1 TABLET (80 MG TOTAL) BY MOUTH DAILY.    Dispense:  90 tablet    Refill:  3   amLODipine (NORVASC) 10 MG tablet    Sig: Take 1/2 tablet (5 mg total) by mouth daily.    Dispense:  45 tablet    Refill:  3     Patient Instructions  Medication Instructions:   Your physician recommends that you continue on your current medications as directed. Please refer to the Current Medication list given to you today.  *If you need a refill on your cardiac medications before your next appointment, please call your pharmacy*   Follow-Up: At [facility], you and your health needs are our priority.  As part of our continuing mission to provide you with exceptional heart care, we have created designated Provider Care Teams.  These Care Teams include your primary Cardiologist (physician) and Advanced Practice Providers (APPs -  Physician Assistants and Nurse Practitioners) who all work  together to provide you with the care you need, when you need it.  We recommend signing up for the patient portal called "MyChart".  Sign up information is provided on this After Visit Summary.  MyChart is used to connect with patients for Virtual Visits (Telemedicine).  Patients are able to view lab/test results, encounter notes, upcoming appointments, etc.  Non-urgent messages can be sent to your provider as well.   To learn more about what you can do with MyChart, go to [email or web address].    Your next appointment:   6 month(s)  The format for your next appointment:   In Person  Provider:   Freada Bergeron, MD        Brandon [name] as a scribe for Brandon Bergeron, MD.,have documented all relevant documentation on the behalf of Brandon Bergeron, MD,as directed by  Brandon Bergeron, MD while in the presence of Brandon Bergeron, MD.  I, Brandon Bergeron, MD, have reviewed all documentation for this visit. The documentation on 07/17/21 for the exam, diagnosis, procedures, and orders are all accurate and complete.   Signed, Brandon Bergeron, MD  07/17/2021 12:31 PM    [place]

## 2021-07-23 ENCOUNTER — Other Ambulatory Visit (HOSPITAL_BASED_OUTPATIENT_CLINIC_OR_DEPARTMENT_OTHER): Payer: Self-pay

## 2021-07-23 ENCOUNTER — Encounter: Payer: Self-pay | Admitting: Family Medicine

## 2021-07-24 ENCOUNTER — Telehealth: Payer: Self-pay | Admitting: Family Medicine

## 2021-07-24 NOTE — Telephone Encounter (Signed)
Pt called.  Do you remember getting medical records from his previous doctors in [place]? Patient wants to forward these records to another doctor's office.

## 2021-07-24 NOTE — Telephone Encounter (Signed)
Received records and reviewed. Removed pertinent items which were abstracted or scanned.  Complete records not available.  These will need to be requested from original provider.

## 2021-07-25 ENCOUNTER — Other Ambulatory Visit (HOSPITAL_BASED_OUTPATIENT_CLINIC_OR_DEPARTMENT_OTHER): Payer: Self-pay

## 2021-07-25 ENCOUNTER — Encounter: Payer: Self-pay | Admitting: Sports Medicine

## 2021-07-25 ENCOUNTER — Telehealth: Payer: Self-pay

## 2021-07-25 MED ORDER — TESTOSTERONE 50 MG/5GM (1%) TD GEL
5.0000 g | Freq: Every day | TRANSDERMAL | 3 refills | Status: DC
  Filled 2021-07-25: qty 150, 30d supply, fill #0
  Filled 2021-08-21 – 2021-08-22 (×2): qty 150, 30d supply, fill #1
  Filled 2021-09-22: qty 150, 30d supply, fill #2
  Filled 2021-10-25: qty 150, 30d supply, fill #3

## 2021-07-25 NOTE — Telephone Encounter (Signed)
Medication: testosterone (ANDROGEL) 50 MG/5GM (1%) GEL Prior authorization submitted via CoverMyMeds on 07/25/2021 PA submission pending

## 2021-07-25 NOTE — Telephone Encounter (Signed)
Per Burna Mortimer: Judson Roch and done a retro authorization for 05/28/21 BIPAP titration. Auth # [id number]

## 2021-07-26 ENCOUNTER — Other Ambulatory Visit (HOSPITAL_BASED_OUTPATIENT_CLINIC_OR_DEPARTMENT_OTHER): Payer: Self-pay

## 2021-07-27 NOTE — Telephone Encounter (Signed)
Thank you. Message passed on to the patient. He will call original provider to get records.

## 2021-07-31 ENCOUNTER — Encounter: Payer: Self-pay | Admitting: Family Medicine

## 2021-07-31 ENCOUNTER — Other Ambulatory Visit (HOSPITAL_BASED_OUTPATIENT_CLINIC_OR_DEPARTMENT_OTHER): Payer: Self-pay

## 2021-07-31 ENCOUNTER — Other Ambulatory Visit (HOSPITAL_COMMUNITY): Payer: Self-pay

## 2021-07-31 DIAGNOSIS — M533 Sacrococcygeal disorders, not elsewhere classified: Secondary | ICD-10-CM

## 2021-07-31 DIAGNOSIS — Z9689 Presence of other specified functional implants: Secondary | ICD-10-CM

## 2021-07-31 DIAGNOSIS — M47816 Spondylosis without myelopathy or radiculopathy, lumbar region: Secondary | ICD-10-CM

## 2021-07-31 DIAGNOSIS — M961 Postlaminectomy syndrome, not elsewhere classified: Secondary | ICD-10-CM

## 2021-08-01 ENCOUNTER — Other Ambulatory Visit (HOSPITAL_COMMUNITY): Payer: Self-pay

## 2021-08-01 ENCOUNTER — Other Ambulatory Visit: Payer: Self-pay | Admitting: Family Medicine

## 2021-08-01 ENCOUNTER — Other Ambulatory Visit (HOSPITAL_BASED_OUTPATIENT_CLINIC_OR_DEPARTMENT_OTHER): Payer: Self-pay

## 2021-08-01 MED ORDER — EMPAGLIFLOZIN 25 MG PO TABS
25.0000 mg | ORAL_TABLET | Freq: Every day | ORAL | 0 refills | Status: DC
  Filled 2021-08-01 (×2): qty 90, 90d supply, fill #0

## 2021-08-02 NOTE — Telephone Encounter (Signed)
Medication: testosterone (ANDROGEL) 50 MG/5GM (1%) GEL Prior authorization determination received Medication has been approved Approval dates: 07/25/2021-07/24/2022  Patient aware via: MyChart Pharmacy aware: Yes Provider aware via this encounter

## 2021-08-06 ENCOUNTER — Ambulatory Visit (INDEPENDENT_AMBULATORY_CARE_PROVIDER_SITE_OTHER): Payer: No Typology Code available for payment source

## 2021-08-06 ENCOUNTER — Other Ambulatory Visit: Payer: Self-pay | Admitting: Physical Medicine and Rehabilitation

## 2021-08-06 ENCOUNTER — Other Ambulatory Visit: Payer: Self-pay

## 2021-08-06 DIAGNOSIS — M25551 Pain in right hip: Secondary | ICD-10-CM | POA: Diagnosis not present

## 2021-08-07 ENCOUNTER — Other Ambulatory Visit (HOSPITAL_BASED_OUTPATIENT_CLINIC_OR_DEPARTMENT_OTHER): Payer: Self-pay

## 2021-08-10 ENCOUNTER — Other Ambulatory Visit (HOSPITAL_BASED_OUTPATIENT_CLINIC_OR_DEPARTMENT_OTHER): Payer: Self-pay

## 2021-08-10 MED ORDER — BUPRENORPHINE 10 MCG/HR TD PTWK
MEDICATED_PATCH | TRANSDERMAL | 0 refills | Status: DC
  Filled 2021-08-10: qty 4, 28d supply, fill #0

## 2021-08-10 MED ORDER — LIDOCAINE 5 % EX PTCH
MEDICATED_PATCH | CUTANEOUS | 2 refills | Status: DC
  Filled 2021-08-10: qty 60, 80d supply, fill #0
  Filled 2021-08-10: qty 30, 10d supply, fill #0
  Filled 2021-08-22: qty 90, 30d supply, fill #1
  Filled 2021-09-26: qty 90, 30d supply, fill #2

## 2021-08-13 ENCOUNTER — Other Ambulatory Visit (HOSPITAL_BASED_OUTPATIENT_CLINIC_OR_DEPARTMENT_OTHER): Payer: Self-pay

## 2021-08-14 ENCOUNTER — Other Ambulatory Visit (HOSPITAL_BASED_OUTPATIENT_CLINIC_OR_DEPARTMENT_OTHER): Payer: Self-pay

## 2021-08-15 ENCOUNTER — Other Ambulatory Visit (HOSPITAL_BASED_OUTPATIENT_CLINIC_OR_DEPARTMENT_OTHER): Payer: Self-pay

## 2021-08-21 ENCOUNTER — Other Ambulatory Visit: Payer: Self-pay | Admitting: Family Medicine

## 2021-08-21 ENCOUNTER — Other Ambulatory Visit (HOSPITAL_BASED_OUTPATIENT_CLINIC_OR_DEPARTMENT_OTHER): Payer: Self-pay

## 2021-08-21 ENCOUNTER — Other Ambulatory Visit (HOSPITAL_COMMUNITY): Payer: Self-pay

## 2021-08-21 MED ORDER — BUPROPION HCL ER (XL) 300 MG PO TB24
300.0000 mg | ORAL_TABLET | Freq: Every day | ORAL | 0 refills | Status: DC
  Filled 2021-08-21 – 2021-08-23 (×2): qty 90, 90d supply, fill #0

## 2021-08-21 MED ORDER — BUPROPION HCL ER (XL) 150 MG PO TB24
150.0000 mg | ORAL_TABLET | Freq: Every day | ORAL | 0 refills | Status: DC
  Filled 2021-08-21 – 2021-08-23 (×2): qty 90, 90d supply, fill #0

## 2021-08-22 ENCOUNTER — Other Ambulatory Visit (HOSPITAL_BASED_OUTPATIENT_CLINIC_OR_DEPARTMENT_OTHER): Payer: Self-pay

## 2021-08-23 ENCOUNTER — Telehealth: Payer: Self-pay

## 2021-08-23 ENCOUNTER — Other Ambulatory Visit (HOSPITAL_BASED_OUTPATIENT_CLINIC_OR_DEPARTMENT_OTHER): Payer: Self-pay

## 2021-08-23 ENCOUNTER — Other Ambulatory Visit (HOSPITAL_COMMUNITY): Payer: Self-pay

## 2021-08-23 DIAGNOSIS — E1169 Type 2 diabetes mellitus with other specified complication: Secondary | ICD-10-CM

## 2021-08-23 NOTE — Telephone Encounter (Signed)
Pt lvm stating he needed a referral placed today to Endo due to insurance not paying for diabetic supplies without one.  Requested referral be placed to [facility]. Current referral expires on 12/31.  Referral has been placed.

## 2021-08-28 ENCOUNTER — Ambulatory Visit (INDEPENDENT_AMBULATORY_CARE_PROVIDER_SITE_OTHER): Payer: No Typology Code available for payment source | Admitting: Family Medicine

## 2021-08-28 ENCOUNTER — Other Ambulatory Visit (HOSPITAL_BASED_OUTPATIENT_CLINIC_OR_DEPARTMENT_OTHER): Payer: Self-pay

## 2021-08-28 ENCOUNTER — Encounter: Payer: Self-pay | Admitting: Family Medicine

## 2021-08-28 ENCOUNTER — Other Ambulatory Visit: Payer: Self-pay

## 2021-08-28 VITALS — BP 100/62 | HR 83 | Ht 69.0 in | Wt 200.0 lb

## 2021-08-28 DIAGNOSIS — I10 Essential (primary) hypertension: Secondary | ICD-10-CM | POA: Diagnosis not present

## 2021-08-28 DIAGNOSIS — E1169 Type 2 diabetes mellitus with other specified complication: Secondary | ICD-10-CM

## 2021-08-28 DIAGNOSIS — E291 Testicular hypofunction: Secondary | ICD-10-CM | POA: Diagnosis not present

## 2021-08-28 NOTE — Progress Notes (Signed)
Brandon [name] - 66 y.o. male MRN [id number]  Date of birth: [birth year]  Subjective Chief Complaint  Patient presents with   Follow-up    HPI Brandon [name] is a 66 year old male here today for follow-up of hypergonadism.  Transitioned from injectable to topical testosterone and is here today to follow-up on this.  Reports he feels pretty good at current dose of topical testosterone.  He does like this better than injectable medication.  ROS:  A comprehensive ROS was completed and negative except as noted per HPI  Allergies  Allergen Reactions   Kiwi Extract Swelling    Mouth swelling.    Other Swelling and Other (See Comments)    EGGPLANT. Mouth swelling.   Penicillins Rash    Reaction: unknown   Peach [Prunus Persica] Other (See Comments)    Burns mouth   Ancef [Cefazolin] Rash   Latex Rash   Levaquin [Levofloxacin] Rash    Past Medical History:  Diagnosis Date   Anxiety    Basal cell carcinoma (BCC) in situ of skin    Colon polyps    Coronary artery disease    S/p DES to mRCA in 12/21 // S/p DES to LCx x 2 in 12/2020 // Diff dz in small to mod Dx (not a good target for PCI); OM1 100 CTO; severe dz in small OM2 and PDA >> Med Rx   Depression    Diabetes mellitus without complication ([place])    HLD (hyperlipidemia)    Hypertension    SBO (small bowel obstruction) ([place])     Past Surgical History:  Procedure Laterality Date   BACK SURGERY     CARDIAC CATHETERIZATION     CATARACT EXTRACTION W/ INTRAOCULAR LENS  IMPLANT, BILATERAL     CHOLECYSTECTOMY     CHOLECYSTECTOMY, LAPAROSCOPIC     CORONARY STENT INTERVENTION N/A 08/15/2020   Procedure: CORONARY STENT INTERVENTION;  Surgeon: Jettie Booze, MD;  Location: [place];  Service: Cardiovascular;  Laterality: N/A;   CORONARY STENT INTERVENTION N/A 01/17/2021   Procedure: CORONARY STENT INTERVENTION;  Surgeon: Burnell Blanks, MD;  Location: [facility];  Service: Cardiovascular;  Laterality: N/A;    EYE SURGERY     INTRAVASCULAR ULTRASOUND/IVUS N/A 08/15/2020   Procedure: Intravascular Ultrasound/IVUS;  Surgeon: Jettie Booze, MD;  Location: [facility];  Service: Cardiovascular;  Laterality: N/A;   LEFT HEART CATH AND CORONARY ANGIOGRAPHY N/A 08/15/2020   Procedure: LEFT HEART CATH AND CORONARY ANGIOGRAPHY;  Surgeon: Jettie Booze, MD;  Location: [facility];  Service: Cardiovascular;  Laterality: N/A;   LEFT HEART CATH AND CORONARY ANGIOGRAPHY N/A 01/17/2021   Procedure: LEFT HEART CATH AND CORONARY ANGIOGRAPHY;  Surgeon: Burnell Blanks, MD;  Location: [facility] [facility];  Service: Cardiovascular;  Laterality: N/A;   LUMBAR FUSION     L3-5 with rods   XI ROBOTIC ASSISTED INGUINAL HERNIA REPAIR WITH MESH     x 3 surgeries    Social History   Socioeconomic History   Marital status: Married    Spouse name: Not on file   Number of children: 4   Years of education: 16   Highest education level: Not on file  Occupational History   Occupation: disabled  Tobacco Use   Smoking status: Former    Types: Cigarettes    Quit date: 2007    Years since quitting: 16.0   Smokeless tobacco: Never  Vaping Use   Vaping Use: Not on file  Substance and Sexual Activity   Alcohol use: Not Currently   Drug use: Not Currently   Sexual activity: Yes  Other Topics Concern   Not on file  Social History Narrative   Not on file   Social Determinants of Health   Financial Resource Strain: Not on file  Food Insecurity: Not on file  Transportation Needs: Not on file  Physical Activity: Not on file  Stress: Not on file  Social Connections: Not on file    Family History  Problem Relation Age of Onset   COPD Mother    Anxiety disorder Mother    Depression Mother    Cancer Father    Anxiety disorder Father    Depression Father    Other Father    Diabetes Neg Hx     Health Maintenance  Topic Date Due   Hepatitis C Screening  Never done    TETANUS/TDAP  Never done   COLONOSCOPY (Pts 45-57yrs Insurance coverage will need to be confirmed)  Never done   Zoster Vaccines- Shingrix (1 of 2) Never done   COVID-19 Vaccine (3 - Booster for Pfizer series) 08/11/2020   HEMOGLOBIN A1C  11/27/2021   OPHTHALMOLOGY EXAM  04/24/2022   FOOT EXAM  05/29/2022   Pneumonia Vaccine 52+ Years old  Completed   INFLUENZA VACCINE  Completed   HIV Screening  Completed   HPV VACCINES  Aged Out     ----------------------------------------------------------------------------------------------------------------------------------------------------------------------------------------------------------------- Physical Exam BP 100/62 (BP Location: Left Arm, Patient Position: Sitting, Cuff Size: Normal)    Pulse 83    Ht 5\' 9"  (1.753 m)    Wt 200 lb (90.7 kg)    SpO2 97%    BMI 29.53 kg/m   Physical Exam Constitutional:      Appearance: Normal appearance.  Eyes:     General: No scleral icterus. Cardiovascular:     Rate and Rhythm: Normal rate and regular rhythm.  Pulmonary:     Effort: Pulmonary effort is normal.     Breath sounds: Normal breath sounds.  Musculoskeletal:     Cervical back: Neck supple.  Neurological:     Mental Status: He is alert.  Psychiatric:        Mood and Affect: Mood normal.        Behavior: Behavior normal.    ------------------------------------------------------------------------------------------------------------------------------------------------------------------------------------------------------------------- Assessment and Plan  Primary hypertension Blood pressure stable at this time.  Continue current antihypertensive medications.  Type 2 diabetes mellitus with other specified complication [facility]) Managed by endocrinology.  We will go ahead and update A1c in anticipation of upcoming appointment.  Male hypogonadism  He feels pretty good with current dose of topical testosterone.  Updating testosterone  levels, PSA and CBC today.   No orders of the defined types were placed in this encounter.   No follow-ups on file.    This visit occurred during the SARS-CoV-2 public health emergency.  Safety protocols were in place, including screening questions prior to the visit, additional usage of staff PPE, and extensive cleaning of exam room while observing appropriate contact time as indicated for disinfecting solutions.

## 2021-08-28 NOTE — Assessment & Plan Note (Signed)
°  He feels pretty good with current dose of topical testosterone.  Updating testosterone levels, PSA and CBC today.

## 2021-08-28 NOTE — Assessment & Plan Note (Signed)
Managed by endocrinology.  We will go ahead and update A1c in anticipation of upcoming appointment.

## 2021-08-28 NOTE — Assessment & Plan Note (Signed)
Blood pressure stable at this time.  Continue current antihypertensive medications. 

## 2021-08-29 ENCOUNTER — Other Ambulatory Visit: Payer: Self-pay | Admitting: Family Medicine

## 2021-08-29 ENCOUNTER — Other Ambulatory Visit (HOSPITAL_BASED_OUTPATIENT_CLINIC_OR_DEPARTMENT_OTHER): Payer: Self-pay

## 2021-08-29 LAB — CBC WITH DIFFERENTIAL/PLATELET
Absolute Monocytes: 531 cells/uL (ref 200–950)
Basophils Absolute: 38 cells/uL (ref 0–200)
Basophils Relative: 0.6 %
Eosinophils Absolute: 51 cells/uL (ref 15–500)
Eosinophils Relative: 0.8 %
HCT: 43.1 % (ref 38.5–50.0)
Hemoglobin: 13.1 g/dL — ABNORMAL LOW (ref 13.2–17.1)
Lymphs Abs: 1299 cells/uL (ref 850–3900)
MCH: 24 pg — ABNORMAL LOW (ref 27.0–33.0)
MCHC: 30.4 g/dL — ABNORMAL LOW (ref 32.0–36.0)
MCV: 78.9 fL — ABNORMAL LOW (ref 80.0–100.0)
MPV: 11.6 fL (ref 7.5–12.5)
Monocytes Relative: 8.3 %
Neutro Abs: 4480 cells/uL (ref 1500–7800)
Neutrophils Relative %: 70 %
Platelets: 266 10*3/uL (ref 140–400)
RBC: 5.46 10*6/uL (ref 4.20–5.80)
RDW: 20.6 % — ABNORMAL HIGH (ref 11.0–15.0)
Total Lymphocyte: 20.3 %
WBC: 6.4 10*3/uL (ref 3.8–10.8)

## 2021-08-29 LAB — PSA: PSA: 1.44 ng/mL (ref <=4.00)

## 2021-08-29 LAB — TESTOSTERONE: Testosterone: 359 ng/dL (ref 250–827)

## 2021-08-29 LAB — HEMOGLOBIN A1C
Hgb A1c MFr Bld: 6.5 % of total Hgb — ABNORMAL HIGH (ref <5.7)
Mean Plasma Glucose: 140 mg/dL
eAG (mmol/L): 7.7 mmol/L

## 2021-08-29 MED ORDER — PANTOPRAZOLE SODIUM 40 MG PO TBEC
40.0000 mg | DELAYED_RELEASE_TABLET | Freq: Every day | ORAL | 0 refills | Status: DC
  Filled 2021-08-29: qty 90, 90d supply, fill #0

## 2021-08-29 MED ORDER — ARIPIPRAZOLE 10 MG PO TABS
10.0000 mg | ORAL_TABLET | Freq: Every day | ORAL | 1 refills | Status: DC
  Filled 2021-08-29: qty 90, 90d supply, fill #0

## 2021-08-29 NOTE — Telephone Encounter (Signed)
[facility] pharmacy requesting med refills for aripiprazole. Written by historical provider.

## 2021-08-30 ENCOUNTER — Other Ambulatory Visit (HOSPITAL_BASED_OUTPATIENT_CLINIC_OR_DEPARTMENT_OTHER): Payer: Self-pay

## 2021-08-31 ENCOUNTER — Other Ambulatory Visit (HOSPITAL_BASED_OUTPATIENT_CLINIC_OR_DEPARTMENT_OTHER): Payer: Self-pay

## 2021-09-03 ENCOUNTER — Other Ambulatory Visit (HOSPITAL_BASED_OUTPATIENT_CLINIC_OR_DEPARTMENT_OTHER): Payer: Self-pay

## 2021-09-04 ENCOUNTER — Ambulatory Visit (INDEPENDENT_AMBULATORY_CARE_PROVIDER_SITE_OTHER): Payer: No Typology Code available for payment source | Admitting: Endocrinology

## 2021-09-04 ENCOUNTER — Other Ambulatory Visit: Payer: Self-pay

## 2021-09-04 VITALS — BP 140/70 | HR 79 | Ht 69.0 in | Wt 201.0 lb

## 2021-09-04 DIAGNOSIS — E1169 Type 2 diabetes mellitus with other specified complication: Secondary | ICD-10-CM | POA: Diagnosis not present

## 2021-09-04 LAB — POCT GLYCOSYLATED HEMOGLOBIN (HGB A1C): Hemoglobin A1C: 6.4 % — AB (ref 4.0–5.6)

## 2021-09-04 NOTE — Progress Notes (Signed)
Subjective:    Patient ID: Brandon [name], male    DOB: 1956/07/16, 66 y.o.   MRN: [id number]  HPI Pt returns for f/u of diabetes mellitus: DM type: Insulin-requiring type 2  Dx'ed: 4492 Complications: CAD Therapy: insulin since 2012 DKA: never Severe hypoglycemia: never Pancreatitis: never Pancreatic imaging: normal on 2021 CT SDOH: none Other: he takes multiple daily injections; he eats meals at 8AM, 1PM, and 7PM. Interval history: He has mild hypoglycemia approx QOD.  This usually happens when lunch is delayed.  pt states he feels well in general.  I reviewed continuous glucose monitor data.  Glucose varies from 60-225.  It is in general highest at 11AM, and lowest 1AM-3AM.  It increases in between.  He wants to reconsider pump.  He averages approx 10-24 units of humalog 3 times a day (just before each meal).  pt states he feels well in general.   Past Medical History:  Diagnosis Date   Anxiety    Basal cell carcinoma (BCC) in situ of skin    Colon polyps    Coronary artery disease    S/p DES to mRCA in 12/21 // S/p DES to LCx x 2 in 12/2020 // Diff dz in small to mod Dx (not a good target for PCI); OM1 100 CTO; severe dz in small OM2 and PDA >> Med Rx   Depression    Diabetes mellitus without complication ([place])    HLD (hyperlipidemia)    Hypertension    SBO (small bowel obstruction) ([place])     Past Surgical History:  Procedure Laterality Date   BACK SURGERY     CARDIAC CATHETERIZATION     CATARACT EXTRACTION W/ INTRAOCULAR LENS  IMPLANT, BILATERAL     CHOLECYSTECTOMY     CHOLECYSTECTOMY, LAPAROSCOPIC     CORONARY STENT INTERVENTION N/A 08/15/2020   Procedure: CORONARY STENT INTERVENTION;  Surgeon: Jettie Booze, MD;  Location: [place] [facility];  Service: Cardiovascular;  Laterality: N/A;   CORONARY STENT INTERVENTION N/A 01/17/2021   Procedure: CORONARY STENT INTERVENTION;  Surgeon: Burnell Blanks, MD;  Location: [place] [facility];  Service:  Cardiovascular;  Laterality: N/A;   EYE SURGERY     INTRAVASCULAR ULTRASOUND/IVUS N/A 08/15/2020   Procedure: Intravascular Ultrasound/IVUS;  Surgeon: Jettie Booze, MD;  Location: [facility];  Service: Cardiovascular;  Laterality: N/A;   LEFT HEART CATH AND CORONARY ANGIOGRAPHY N/A 08/15/2020   Procedure: LEFT HEART CATH AND CORONARY ANGIOGRAPHY;  Surgeon: Jettie Booze, MD;  Location: [place] [facility];  Service: Cardiovascular;  Laterality: N/A;   LEFT HEART CATH AND CORONARY ANGIOGRAPHY N/A 01/17/2021   Procedure: LEFT HEART CATH AND CORONARY ANGIOGRAPHY;  Surgeon: Burnell Blanks, MD;  Location: [place] [facility];  Service: Cardiovascular;  Laterality: N/A;   LUMBAR FUSION     L3-5 with rods   XI ROBOTIC ASSISTED INGUINAL HERNIA REPAIR WITH MESH     x 3 surgeries    Social History   Socioeconomic History   Marital status: Married    Spouse name: Not on file   Number of children: 4   Years of education: 16   Highest education level: Not on file  Occupational History   Occupation: disabled  Tobacco Use   Smoking status: Former    Types: Cigarettes    Quit date: 2007    Years since quitting: 16.0   Smokeless tobacco: Never  Vaping Use   Vaping Use: Not on file  Substance  and Sexual Activity   Alcohol use: Not Currently   Drug use: Not Currently   Sexual activity: Yes  Other Topics Concern   Not on file  Social History Narrative   Not on file   Social Determinants of Health   Financial Resource Strain: Not on file  Food Insecurity: Not on file  Transportation Needs: Not on file  Physical Activity: Not on file  Stress: Not on file  Social Connections: Not on file  Intimate Partner Violence: Not on file    Current Outpatient Medications on File Prior to Visit  Medication Sig Dispense Refill   amLODipine (NORVASC) 10 MG tablet Take 1/2 tablet (5 mg total) by mouth daily. 45 tablet 3   ARIPiprazole (ABILIFY) 10 MG tablet Take 10 mg by  mouth every morning.     ARIPiprazole (ABILIFY) 10 MG tablet Take 1 tablet (10 mg total) by mouth daily. 90 tablet 1   aspirin 81 MG EC tablet Take 81 mg by mouth every morning.     atorvastatin (LIPITOR) 80 MG tablet TAKE 1 TABLET (80 MG TOTAL) BY MOUTH DAILY. 90 tablet 3   Blood Glucose Monitoring Suppl (FREESTYLE LITE) w/Device KIT USE AS DIRECTED. 1 kit 99   buprenorphine (BUTRANS) 10 MCG/HR PTWK 1 patch once a week.     buPROPion (WELLBUTRIN XL) 150 MG 24 hr tablet Take 1 tablet (150 mg total) by mouth daily. 90 tablet 0   buPROPion (WELLBUTRIN XL) 300 MG 24 hr tablet Take 1 tablet (300 mg total) by mouth daily. 90 tablet 0   clopidogrel (PLAVIX) 75 MG tablet TAKE 1 TABLET (75 MG TOTAL) BY MOUTH DAILY WITH BREAKFAST. 90 tablet 3   Continuous Blood Gluc Sensor (DEXCOM G6 SENSOR) MISC Use as directed. Change sensor every 10 days 9 each 3   empagliflozin (JARDIANCE) 25 MG TABS tablet Take 1 tablet (25 mg total) by mouth daily. 90 tablet 0   glucose blood test strip USE 1 TO CHECK BLOOD GLUCOSE UP TO 5 TIMES DAILY. (Patient taking differently: USE 1 TO CHECK BLOOD GLUCOSE UP TO 5 TIMES DAILY.) 300 strip 12   insulin lispro (HUMALOG) 100 UNIT/ML KwikPen Inject 24 Units into the skin 3 (three) times daily before meals. 75 mL 3   isosorbide mononitrate (IMDUR) 30 MG 24 hr tablet Take 1 tablet (30 mg total) by mouth daily. 90 tablet 2   Lancets (FREESTYLE) lancets USE 1 TO CHECK BLOOD GLUCOSE UP TO 5 TIMES DAILY. (Patient taking differently: USE 1 TO CHECK BLOOD GLUCOSE UP TO 5 TIMES DAILY.) 300 each 12   lidocaine (LIDODERM) 5 % Apply 1 - 3 patches on to the skin as directed. 12 hours on and 12 hours off. 90 patch 2   losartan (COZAAR) 25 MG tablet Take 1 tablet (25 mg total) by mouth daily. Take only when systolic BP greater than 789. 90 tablet 1   metoCLOPramide (REGLAN) 10 MG tablet Take 10 mg by mouth daily as needed for nausea or vomiting (upset stomach).     metoprolol succinate (TOPROL-XL) 50  MG 24 hr tablet Take 1 tablet (50 mg total) by mouth daily. Take with or immediately following a meal. 90 tablet 2   nystatin (MYCOSTATIN) 100000 UNIT/ML suspension Use as directed 5 mLs (500,000 Units total) in the mouth or throat 4 (four) times daily for 7 days. 200 mL 1   ondansetron (ZOFRAN ODT) 4 MG disintegrating tablet Take 1 tablet (4 mg total) by mouth every 8 (eight) hours  as needed for nausea or vomiting. 20 tablet 0   pantoprazole (PROTONIX) 40 MG tablet Take 1 tablet (40 mg total) by mouth daily. 90 tablet 0   prednisoLONE-lidocaine-nystatin-diphenhydrAMINE-alum & mag hydroxide-simeth Swish and spit 53ms 3 times daily as needed for mouth pain. 200 mL 1   Semaglutide, 2 MG/DOSE, (OZEMPIC, 2 MG/DOSE,) 8 MG/3ML SOPN Inject 2 mg into the skin once a week. 9 mL 3   tamsulosin (FLOMAX) 0.4 MG CAPS capsule Take 1 capsule (0.4 mg total) by mouth daily. 90 capsule 1   testosterone (ANDROGEL) 50 MG/5GM (1%) GEL Place 5 g (1 packet) onto the skin daily. Apply to shoulders and upper arms 150 g 3   tiZANidine (ZANAFLEX) 4 MG tablet Take 2 tablets (8 mg total) by mouth 3 (three) times daily. 180 tablet 11   traMADol (ULTRAM) 50 MG tablet Take 2 tablets (100 mg total) by mouth 3 (three) times daily as needed. 180 tablet 0   traZODone (DESYREL) 50 MG tablet TAKE 1-2 TABLETS (50-100 MG TOTAL) BY MOUTH AT BEDTIME AS NEEDED FOR SLEEP. 60 tablet 3   nitroGLYCERIN (NITROSTAT) 0.4 MG SL tablet Place 1 tablet (0.4 mg total) under the tongue every 5 (five) minutes as needed for chest pain. 60 tablet 0   triazolam (HALCION) 0.25 MG tablet TAKE 1-2 TABS BY MOUTH 2 HOURS BEFORE PROCEDURE OR IMAGING.DO NOT DRIVE WITH THIS MEDICATION. 2 tablet 0   No current facility-administered medications on file prior to visit.    Allergies  Allergen Reactions   Kiwi Extract Swelling    Mouth swelling.    Other Swelling and Other (See Comments)    EGGPLANT. Mouth swelling.   Penicillins Rash    Reaction: unknown    Peach [Prunus Persica] Other (See Comments)    Burns mouth   Ancef [Cefazolin] Rash   Latex Rash   Levaquin [Levofloxacin] Rash    Family History  Problem Relation Age of Onset   COPD Mother    Anxiety disorder Mother    Depression Mother    Cancer Father    Anxiety disorder Father    Depression Father    Other Father    Diabetes Neg Hx     BP 140/70    Pulse 79    Ht '5\' 9"'  (1.753 m)    Wt 201 lb (91.2 kg)    SpO2 95%    BMI 29.68 kg/m    Review of Systems     Objective:   Physical Exam    Lab Results  Component Value Date   HGBA1C 6.4 (A) 09/04/2021      Assessment & Plan:  Insulin-requiring type 2 DM.  Hypoglycemia, due to insulin.   Patient Instructions  check your blood sugar twice a day.  vary the time of day when you check, between before the 3 meals, and at bedtime.  also check if you have symptoms of your blood sugar being too high or too low.  please keep a record of the readings and bring it to your next appointment here (or you can bring the meter itself).  You can write it on any piece of paper.  please call [facility] if your blood sugar goes below 70, or if most of your readings are over 200.  Please reduce the Humalog to 1 unit/3.6 grams, and continue the same Jardiance and Ozempic.  Please see a diabetes educator, to consider an insulin pump.   Please come back for a follow-up appointment in 3  mos.

## 2021-09-04 NOTE — Patient Instructions (Addendum)
check your blood sugar twice a day.  vary the time of day when you check, between before the 3 meals, and at bedtime.  also check if you have symptoms of your blood sugar being too high or too low.  please keep a record of the readings and bring it to your next appointment here (or you can bring the meter itself).  You can write it on any piece of paper.  please call us sooner if your blood sugar goes below 70, or if most of your readings are over 200.  Please reduce the Humalog to 1 unit/3.6 grams, and continue the same Jardiance and Ozempic.  Please see a diabetes educator, to consider an insulin pump.   Please come back for a follow-up appointment in 3 mos.

## 2021-09-06 ENCOUNTER — Ambulatory Visit: Payer: No Typology Code available for payment source | Admitting: Family Medicine

## 2021-09-10 ENCOUNTER — Other Ambulatory Visit (HOSPITAL_BASED_OUTPATIENT_CLINIC_OR_DEPARTMENT_OTHER): Payer: Self-pay

## 2021-09-11 ENCOUNTER — Encounter: Payer: No Typology Code available for payment source | Attending: Endocrinology | Admitting: Nutrition

## 2021-09-11 ENCOUNTER — Other Ambulatory Visit (HOSPITAL_BASED_OUTPATIENT_CLINIC_OR_DEPARTMENT_OTHER): Payer: Self-pay

## 2021-09-11 ENCOUNTER — Other Ambulatory Visit: Payer: Self-pay

## 2021-09-11 DIAGNOSIS — E1169 Type 2 diabetes mellitus with other specified complication: Secondary | ICD-10-CM | POA: Insufficient documentation

## 2021-09-11 DIAGNOSIS — Z794 Long term (current) use of insulin: Secondary | ICD-10-CM | POA: Insufficient documentation

## 2021-09-11 MED ORDER — BUPRENORPHINE 15 MCG/HR TD PTWK
MEDICATED_PATCH | TRANSDERMAL | 0 refills | Status: DC
  Filled 2021-09-11: qty 4, 28d supply, fill #0

## 2021-09-11 NOTE — Patient Instructions (Signed)
Go to websites for more information on insulin pump therapy Call me when your pump comes in to set up a date for training.

## 2021-09-11 NOTE — Progress Notes (Signed)
Patient is here to discussing insulin pump therapy.  He is currently wearing a Dexcom, and wishes to get a pump compatible with this. He was shown the Tandem and OmniPod 5 and we discussed advantages/disadvantages of pump therapy, as well as the differences in these two pumps. He has chosen the Tandem pump.  Paperwork filled out and faxed to [employer].  He was encouraged to go to the web site for futher information on this pump.

## 2021-09-12 ENCOUNTER — Other Ambulatory Visit (HOSPITAL_BASED_OUTPATIENT_CLINIC_OR_DEPARTMENT_OTHER): Payer: Self-pay

## 2021-09-13 ENCOUNTER — Other Ambulatory Visit (HOSPITAL_COMMUNITY): Payer: Self-pay

## 2021-09-13 MED FILL — Glucose Blood Test Strip: 60 days supply | Qty: 300 | Fill #0 | Status: AC

## 2021-09-14 ENCOUNTER — Telehealth: Payer: Self-pay | Admitting: Endocrinology

## 2021-09-14 ENCOUNTER — Other Ambulatory Visit (HOSPITAL_BASED_OUTPATIENT_CLINIC_OR_DEPARTMENT_OTHER): Payer: Self-pay

## 2021-09-14 ENCOUNTER — Other Ambulatory Visit: Payer: Self-pay | Admitting: Endocrinology

## 2021-09-14 DIAGNOSIS — Z794 Long term (current) use of insulin: Secondary | ICD-10-CM

## 2021-09-14 DIAGNOSIS — E1169 Type 2 diabetes mellitus with other specified complication: Secondary | ICD-10-CM

## 2021-09-14 NOTE — Telephone Encounter (Signed)
Message sent thru MyChart for pt to contact office to have labs drawn

## 2021-09-14 NOTE — Telephone Encounter (Signed)
please contact patient: We need fasting labs, for pump application.  Please come in to have drawn

## 2021-09-17 ENCOUNTER — Other Ambulatory Visit (HOSPITAL_COMMUNITY): Payer: Self-pay

## 2021-09-19 ENCOUNTER — Other Ambulatory Visit (HOSPITAL_COMMUNITY): Payer: Self-pay

## 2021-09-19 ENCOUNTER — Other Ambulatory Visit (INDEPENDENT_AMBULATORY_CARE_PROVIDER_SITE_OTHER): Payer: No Typology Code available for payment source

## 2021-09-19 ENCOUNTER — Other Ambulatory Visit: Payer: Self-pay | Admitting: Endocrinology

## 2021-09-19 ENCOUNTER — Other Ambulatory Visit: Payer: Self-pay

## 2021-09-19 DIAGNOSIS — Z794 Long term (current) use of insulin: Secondary | ICD-10-CM

## 2021-09-19 DIAGNOSIS — E1169 Type 2 diabetes mellitus with other specified complication: Secondary | ICD-10-CM | POA: Diagnosis not present

## 2021-09-19 LAB — GLUCOSE, FASTING: Glucose, Bld: 120 mg/dL — ABNORMAL HIGH (ref 65–99)

## 2021-09-20 LAB — INSULIN, RANDOM: Insulin: 13.6 u[IU]/mL

## 2021-09-20 LAB — C-PEPTIDE: C-Peptide: 2.43 ng/mL (ref 0.80–3.85)

## 2021-09-22 LAB — GLUTAMIC ACID DECARBOXYLASE AUTO ABS: Glutamic Acid Decarb Ab: <5 IU/mL (ref <5)

## 2021-09-24 ENCOUNTER — Other Ambulatory Visit (HOSPITAL_COMMUNITY): Payer: Self-pay

## 2021-09-26 ENCOUNTER — Other Ambulatory Visit (HOSPITAL_BASED_OUTPATIENT_CLINIC_OR_DEPARTMENT_OTHER): Payer: Self-pay

## 2021-09-26 ENCOUNTER — Other Ambulatory Visit: Payer: Self-pay | Admitting: Family Medicine

## 2021-09-26 MED ORDER — NITROGLYCERIN 0.4 MG SL SUBL
0.4000 mg | SUBLINGUAL_TABLET | SUBLINGUAL | 0 refills | Status: DC | PRN
  Filled 2021-09-26: qty 50, 14d supply, fill #0

## 2021-09-26 MED ORDER — ONDANSETRON 4 MG PO TBDP
4.0000 mg | ORAL_TABLET | Freq: Three times a day (TID) | ORAL | 0 refills | Status: DC | PRN
Start: 2021-09-26 — End: 2021-12-12
  Filled 2021-09-26: qty 20, 7d supply, fill #0

## 2021-09-27 ENCOUNTER — Telehealth: Payer: Self-pay | Admitting: Dietician

## 2021-09-27 NOTE — Telephone Encounter (Signed)
Patient called and stated that Medicare has turned down Tandem insulin pump.  He states that his private insurance is the one that will cover this and needs help navigating insurance.  Message left with Bonita Quin and patient called and informed.  Oran Rein, RD, LDN, CDCES

## 2021-10-01 ENCOUNTER — Telehealth: Payer: Self-pay

## 2021-10-01 ENCOUNTER — Other Ambulatory Visit (HOSPITAL_BASED_OUTPATIENT_CLINIC_OR_DEPARTMENT_OTHER): Payer: Self-pay

## 2021-10-01 NOTE — Telephone Encounter (Signed)
Spoke with [employer] over at [facility] care regarding paperwork for pt's tandem pump supplies. I informed collins that as soon as I am able to get to the forms I will look them over and have ellison to sign and fax out.

## 2021-10-02 ENCOUNTER — Telehealth: Payer: Self-pay

## 2021-10-02 ENCOUNTER — Ambulatory Visit
Payer: No Typology Code available for payment source | Attending: Student in an Organized Health Care Education/Training Program | Admitting: Student in an Organized Health Care Education/Training Program

## 2021-10-02 ENCOUNTER — Encounter: Payer: Self-pay | Admitting: Student in an Organized Health Care Education/Training Program

## 2021-10-02 ENCOUNTER — Other Ambulatory Visit: Payer: Self-pay

## 2021-10-02 VITALS — BP 111/70 | HR 81 | Temp 97.2°F | Resp 18 | Ht 69.0 in | Wt 197.0 lb

## 2021-10-02 DIAGNOSIS — M47816 Spondylosis without myelopathy or radiculopathy, lumbar region: Secondary | ICD-10-CM | POA: Diagnosis present

## 2021-10-02 DIAGNOSIS — Z9689 Presence of other specified functional implants: Secondary | ICD-10-CM | POA: Diagnosis present

## 2021-10-02 DIAGNOSIS — G894 Chronic pain syndrome: Secondary | ICD-10-CM

## 2021-10-02 DIAGNOSIS — Z981 Arthrodesis status: Secondary | ICD-10-CM | POA: Diagnosis present

## 2021-10-02 DIAGNOSIS — M961 Postlaminectomy syndrome, not elsewhere classified: Secondary | ICD-10-CM | POA: Diagnosis present

## 2021-10-02 NOTE — Patient Instructions (Signed)
Today we discussed lumbar medial branch nerve blocks. Before we proceed we will need cardiac clearance from your cardiologist to stop Plavix 7 days prior to your injections. With you having stent placement June 2022, you are HIGH risk to be off plavix so I want your MD to weight in. If she prefers you wait until June to stop Plavix, please let me know. 2. Uds today, follow up in 10 days for MM

## 2021-10-02 NOTE — Telephone Encounter (Signed)
Dr. Cherylann Ratel would like clearance for patient to stop Plavix for 7 days for Lumbar facet Block?

## 2021-10-02 NOTE — Progress Notes (Addendum)
Patient: Brandon [name]  Service Category: E/M  Provider: Gillis Santa, MD  DOB: 1955-10-30  DOS: 10/02/2021  Referring Provider: Luetta Nutting, DO  MRN: [id number]  Setting: Ambulatory outpatient  PCP: Luetta Nutting, DO  Type: New Patient  Specialty: Interventional Pain Management    Location: Office  Delivery: Face-to-face     Primary Reason(s) for Visit: Encounter for initial evaluation of one or more chronic problems (new to examiner) potentially causing chronic pain, and posing a threat to normal musculoskeletal function. (Level of risk: High) CC: Back Pain  HPI  Brandon [name] is a 66 y.o. year old, male patient, who comes for the first time to our practice referred by Luetta Nutting, DO for our initial evaluation of his chronic pain. He has Primary hypertension; Type 2 diabetes mellitus with other specified complication ([place]); Hyperlipidemia associated with type 2 diabetes mellitus ([place]); Sepsis ([place]); Spinal cord stimulator status; Male hypogonadism; Erectile dysfunction; Major depression in partial remission ([place]); Lumbar spondylosis; Chest pain; Coronary artery disease; Status post coronary artery stent placement; Mixed hyperlipidemia; Insomnia; Vertigo; Myelopathy ([place]); Unstable angina ([place]); Acute pain of left shoulder; Sacroiliac joint disease; Thrush; Metallic taste; OSA (obstructive sleep apnea); Chronic pain syndrome; History of lumbar fusion (L4/5); and Post laminectomy syndrome on their problem list. Today he comes in for evaluation of his Back Pain  Pain Assessment: Location: Lower, Right, Left Back Radiating: Radaites from right lower back into right hip Onset: More than a month ago Duration: Chronic pain Quality: Aching, Constant, Pressure, Tingling, Sharp, Shooting, Nagging, Cramping Severity: 4 /10 (subjective, self-reported pain score)  Effect on ADL: "It slows me down a lot and even stops me from doing what I want, sleeping is hard because of pain, hard to position from one  place to another" Timing: Constant Modifying factors: Butrans patch 73mg patch help some and sometimes lidocain patch BP: 111/70   HR: 81  Onset and Duration: Sudden and Present longer than 3 months Cause of pain:  slip and fall in shower Severity: Getting worse, NAS-11 at its worse: 9/10, NAS-11 at its best: 4/10, NAS-11 now: 5/10, and NAS-11 on the average: 5/10 Timing: Night, Not influenced by the time of the day, After activity or exercise, and After a period of immobility Aggravating Factors: Bending, Intercourse (sex), Kneeling, Lifiting, Motion, Prolonged sitting, Prolonged standing, Squatting, Stooping , Surgery made it worse, Twisting, Walking, Walking uphill, and Walking downhill Alleviating Factors: Acupuncture, Stretching, Lying down, Medications, Nerve blocks, Resting, TENS, Using a brace, and Warm showers or baths Associated Problems: Constipation, Night-time cramps, Depression, Erectile dysfunction, Fatigue, Numbness, Personality changes, Sadness, Swelling, Tingling, Weakness, Pain that wakes patient up, and Pain that does not allow patient to sleep Quality of Pain: Aching, Agonizing, Annoying, Constant, Cramping, Deep, Disabling, Distressing, Fearful, Feeling of constriction, Feeling of weight, Hot, Nagging, Pressure-like, Sharp, Shooting, Stabbing, Tingling, and Uncomfortable Previous Examinations or Tests: Bone scan, Cutaneous Pain Threshold Testing (CPT), CT scan, Discogram, EMG/PNCV, MRI scan, Nerve block, Spinal tap, X-rays, Nerve conduction test, Neurological evaluation, Neurosurgical evaluation, Orthopedic evaluation, and Psychiatric evaluation Previous Treatments: Epidural steroid injections, Facet blocks, Narcotic medications, Physical Therapy, Pool exercises, Relaxation therapy, Spinal cord stimulator, Steroid treatments by mouth, Strengthening exercises, Stretching exercises, TENS, and Trigger point injections  Brandon a pleasant 66year old male who presents with a chief  complaint of low back pain with radiation into his right hip and right buttock.  [place]  He  was seeing pain management there.  He has a history of L4-L5 decompression and fusion for disc herniations that were resulting in leg pain.  He has had multiple spinal injections including epidural steroid injections as well as a spinal cord stimulator that was implanted in [place] by his pain physician.  He has a Nevro spinal cord stimulator in place.  He states that he is experiencing not as great analgesic benefit with the stimulator as he was in [place].  He has difficulty walking for extended period of time without having pain in his lower back that radiates into his buttock region.  He is status post bilateral SI joint injection with ultrasound guidance which unfortunately was not effective.  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  In regards to medication management, he has failed various neuropathic's including gabapentin, Lyrica, Cymbalta, TCAs.  He takes tizanidine as needed and also finds benefit with Butrans patch at 15 mcg an hour.  He also utilizes a lidocaine patch.  Cardiac history significant for coronary artery disease status post 3 drug-eluting stents with the most recent 1 placed June 2022.  He is being followed by cardiology.  Historic Controlled Substance Pharmacotherapy Review  PMP and historical list of controlled substances:   Butrans patch 15 mcg daily  Historical Monitoring: The patient  reports that he does not currently use drugs. List of all UDS Test(s): Lab Results  Component Value Date   COCAINSCRNUR NONE DETECTED 08/12/2020   THCU NONE DETECTED 08/12/2020   List of other Serum/Urine Drug  Screening Test(s):  Lab Results  Component Value Date   COCAINSCRNUR NONE DETECTED 08/12/2020   THCU NONE DETECTED 08/12/2020   Historical Background Evaluation: [place] PMP: PDMP reviewed during this encounter. Online review of the past 23-monthperiod conducted.             [place] Department of public safety, offender search: (Editor, commissioningInformation) Non-contributory Risk Assessment Profile: Aberrant behavior: None observed or detected today Risk factors for fatal opioid overdose: None identified today Fatal overdose hazard ratio (HR): Calculation deferred Non-fatal overdose hazard ratio (HR): Calculation deferred Risk of opioid abuse or dependence: 0.7-3.0% with doses ? 36 MME/day and 6.1-26% with doses ? 120 MME/day. Substance use disorder (SUD) risk level: See below Personal History of Substance Abuse (SUD-Substance use disorder):  Alcohol: Negative  Illegal Drugs: Negative  Rx Drugs: Negative  ORT Risk Level calculation: Moderate Risk  Opioid Risk Tool - 10/02/21 1017       Family History of Substance Abuse   Alcohol Positive Male   father   Illegal Drugs Negative    Rx Drugs Negative      Personal History of Substance Abuse   Alcohol Negative    Illegal Drugs Negative    Rx Drugs Negative      Age   Age between 138-45years  No      Psychological Disease   Psychological Disease Negative    Depression Positive      Total Score   Opioid Risk Tool Scoring 4    Opioid Risk Interpretation Moderate Risk            ORT Scoring interpretation table:  Score <3 = Low Risk for SUD  Score between 4-7 = Moderate Risk for SUD  Score >8 = High Risk for  Opioid Abuse   PHQ-2 Depression Scale:  Total score: 1  PHQ-2 Scoring interpretation table: (Score and probability of major depressive disorder)  Score 0 = No depression  Score 1 = 15.4% Probability  Score 2 = 21.1% Probability  Score 3 = 38.4% Probability  Score 4 = 45.5% Probability  Score 5 = 56.4% Probability  Score 6 =  78.6% Probability   PHQ-9 Depression Scale:  Total score: 1  PHQ-9 Scoring interpretation table:  Score 0-4 = No depression  Score 5-9 = Mild depression  Score 10-14 = Moderate depression  Score 15-19 = Moderately severe depression  Score 20-27 = Severe depression (2.4 times higher risk of SUD and 2.89 times higher risk of overuse)   Pharmacologic Plan: As per protocol, I have not taken over any controlled substance management, pending the results of ordered tests and/or consults.            Initial impression: Pending review of available data and ordered tests.  Meds   Current Outpatient Medications:    amLODipine (NORVASC) 10 MG tablet, Take 1/2 tablet (5 mg total) by mouth daily., Disp: 45 tablet, Rfl: 3   ARIPiprazole (ABILIFY) 10 MG tablet, Take 10 mg by mouth every morning., Disp: , Rfl:    aspirin 81 MG EC tablet, Take 81 mg by mouth every morning., Disp: , Rfl:    atorvastatin (LIPITOR) 80 MG tablet, TAKE 1 TABLET (80 MG TOTAL) BY MOUTH DAILY., Disp: 90 tablet, Rfl: 3   Blood Glucose Monitoring Suppl (FREESTYLE LITE) w/Device KIT, USE AS DIRECTED., Disp: 1 kit, Rfl: 99   buprenorphine (BUTRANS) 15 MCG/HR, Apply 1 patch to skin once a week, Disp: 4 patch, Rfl: 0   buprenorphine (BUTRANS) 15 MCG/HR, Apply 1 patch to skin once a week, Disp: 4 patch, Rfl: 0   buPROPion (WELLBUTRIN XL) 150 MG 24 hr tablet, Take 1 tablet (150 mg total) by mouth daily., Disp: 90 tablet, Rfl: 0   buPROPion (WELLBUTRIN XL) 300 MG 24 hr tablet, Take 1 tablet (300 mg total) by mouth daily., Disp: 90 tablet, Rfl: 0   clopidogrel (PLAVIX) 75 MG tablet, TAKE 1 TABLET (75 MG TOTAL) BY MOUTH DAILY WITH BREAKFAST., Disp: 90 tablet, Rfl: 3   Continuous Blood Gluc Sensor (DEXCOM G6 SENSOR) MISC, Use as directed. Change sensor every 10 days, Disp: 9 each, Rfl: 3   empagliflozin (JARDIANCE) 25 MG TABS tablet, Take 1 tablet (25 mg total) by mouth daily., Disp: 90 tablet, Rfl: 0   glucose blood test strip, USE 1 TO  CHECK BLOOD GLUCOSE UP TO 5 TIMES DAILY. (Patient taking differently: USE 1 TO CHECK BLOOD GLUCOSE UP TO 5 TIMES DAILY.), Disp: 300 strip, Rfl: 12   insulin lispro (HUMALOG) 100 UNIT/ML KwikPen, Inject 24 Units into the skin 3 (three) times daily before meals., Disp: 75 mL, Rfl: 3   isosorbide mononitrate (IMDUR) 30 MG 24 hr tablet, Take 1 tablet (30 mg total) by mouth daily., Disp: 90 tablet, Rfl: 2   Lancets (FREESTYLE) lancets, USE 1 TO CHECK BLOOD GLUCOSE UP TO 5 TIMES DAILY. (Patient taking differently: USE 1 TO CHECK BLOOD GLUCOSE UP TO 5 TIMES DAILY.), Disp: 300 each, Rfl: 12   lidocaine (LIDODERM) 5 %, Apply 1 - 3 patches on to the skin as directed. 12 hours on and 12 hours off., Disp: 90 patch, Rfl: 2   losartan (COZAAR) 25 MG tablet, Take 1 tablet (25 mg total) by mouth daily. Take only when systolic BP greater than  110., Disp: 90 tablet, Rfl: 1   metoCLOPramide (REGLAN) 10 MG tablet, Take 10 mg by mouth daily as needed for nausea or vomiting (upset stomach)., Disp: , Rfl:    metoprolol succinate (TOPROL-XL) 50 MG 24 hr tablet, Take 1 tablet (50 mg total) by mouth daily. Take with or immediately following a meal., Disp: 90 tablet, Rfl: 2   nitroGLYCERIN (NITROSTAT) 0.4 MG SL tablet, Place 1 tablet (0.4 mg total) under the tongue every 5 (five) minutes as needed for chest pain., Disp: 50 tablet, Rfl: 0   ondansetron (ZOFRAN-ODT) 4 MG disintegrating tablet, Take 1 tablet (4 mg total) by mouth every 8 (eight) hours as needed for nausea or vomiting., Disp: 20 tablet, Rfl: 0   pantoprazole (PROTONIX) 40 MG tablet, Take 1 tablet (40 mg total) by mouth daily., Disp: 90 tablet, Rfl: 0   Semaglutide, 2 MG/DOSE, (OZEMPIC, 2 MG/DOSE,) 8 MG/3ML SOPN, Inject 2 mg into the skin once a week., Disp: 9 mL, Rfl: 3   tamsulosin (FLOMAX) 0.4 MG CAPS capsule, Take 1 capsule (0.4 mg total) by mouth daily., Disp: 90 capsule, Rfl: 1   testosterone (ANDROGEL) 50 MG/5GM (1%) GEL, Place 5 g (1 packet) onto the skin  daily. Apply to shoulders and upper arms, Disp: 150 g, Rfl: 3   tiZANidine (ZANAFLEX) 4 MG tablet, Take 2 tablets (8 mg total) by mouth 3 (three) times daily., Disp: 180 tablet, Rfl: 11   traMADol (ULTRAM) 50 MG tablet, Take 2 tablets (100 mg total) by mouth 3 (three) times daily as needed., Disp: 180 tablet, Rfl: 0   traZODone (DESYREL) 50 MG tablet, TAKE 1-2 TABLETS (50-100 MG TOTAL) BY MOUTH AT BEDTIME AS NEEDED FOR SLEEP., Disp: 60 tablet, Rfl: 3   buprenorphine (BUTRANS) 10 MCG/HR PTWK, 1 patch once a week. (Patient not taking: Reported on 10/02/2021), Disp: , Rfl:    nystatin (MYCOSTATIN) 100000 UNIT/ML suspension, Use as directed 5 mLs (500,000 Units total) in the mouth or throat 4 (four) times daily for 7 days. (Patient not taking: Reported on 10/02/2021), Disp: 200 mL, Rfl: 1   prednisoLONE-lidocaine-nystatin-diphenhydrAMINE-alum & mag hydroxide-simeth, Swish and spit 75ms 3 times daily as needed for mouth pain. (Patient not taking: Reported on 10/02/2021), Disp: 200 mL, Rfl: 1   triazolam (HALCION) 0.25 MG tablet, TAKE 1-2 TABS BY MOUTH 2 HOURS BEFORE PROCEDURE OR IMAGING.DO NOT DRIVE WITH THIS MEDICATION., Disp: 2 tablet, Rfl: 0  Imaging Review  DG Cervical Spine Complete  Narrative CLINICAL DATA:  Pain  EXAM: CERVICAL SPINE - COMPLETE 4+ VIEW  COMPARISON:  None.  FINDINGS: No fracture or static subluxation of the cervical spine. There is mild disc space height loss and osteophytosis of the lower cervical levels, with sizable anterior osteophytes at C2-C3, C5-C6, and C6-C7. There is likely mild left-sided bony neural foraminal stenosis at C5-C6 and C6-C7. No evident right-sided bony neural foraminal stenosis. The skull base, cervical soft tissues, and partially included upper chest are unremarkable.  IMPRESSION: 1.  No fracture or static subluxation of the cervical spine.  2. Mild disc space height loss and osteophytosis of the lower cervical levels, with sizable anterior  osteophytes at C2-C3, C5-C6, and C6-C7.  3. There is likely mild left-sided bony neural foraminal stenosis at C5-C6 and C6-C7.  4. Cervical disc and neural foraminal pathology may be further evaluated by MRI if indicated by neurologically localizing signs and symptoms.   Electronically Signed By: AEddie CandleM.D. On: 11/29/2020 10:56  DG Shoulder Left  Narrative CLINICAL  DATA:  Left shoulder pain for 1 week, no known injury, initial encounter  EXAM: LEFT SHOULDER - 2+ VIEW  COMPARISON:  None.  FINDINGS: Mild degenerative changes of the acromioclavicular joint are seen. No acute fracture or dislocation is noted. Underlying bony thorax is within normal limits. No soft tissue abnormality is seen.  IMPRESSION: No acute abnormality noted.   Electronically Signed By: Inez Catalina M.D. On: 04/03/2021 20:18  CLINICAL DATA:  Progressive weakness in the legs  EXAM: THORACIC SPINE - 3 VIEWS  COMPARISON:  None.  FINDINGS: There is no evidence of thoracic spine fracture. Alignment is normal. No other significant bone abnormalities are identified. Generalized osteopenia. Degenerative disease with disc height loss throughout the thoracic spine. Thoracic spinal stimulator with the metallic portions in the mid thoracic spine. Mild S-shaped curvature of the thoracolumbar spine.  IMPRESSION: 1. No acute osseous injury of the thoracic spine. 2. Thoracic spine spondylosis.   Electronically Signed By: Kathreen Devoid On: 11/29/2020 10:56 MR Lumbar Spine W Wo Contrast  Narrative CLINICAL DATA:  66 year old male with persistent back pain, lumbar radiculopathy. Prior surgery. MRI conditional spinal cord stimulator. No adverse events during imaging.  EXAM: MRI LUMBAR SPINE WITHOUT AND WITH CONTRAST  TECHNIQUE: Multiplanar and multiecho pulse sequences of the lumbar spine were obtained without and with intravenous contrast.  CONTRAST:  24m GADAVIST GADOBUTROL 1 MMOL/ML  IV SOLN  COMPARISON:  Lumbar radiographs 07/26/2020. CT Abdomen and Pelvis 04/06/2020.  FINDINGS: Segmentation:  Normal.  Alignment: Stable lumbar lordosis since last year. No spondylolisthesis.  Vertebrae: Mild hardware susceptibility artifact at L4-L5. Susceptibility artifact related to lower thoracic spinal cord stimulator device, which enters the posterior spinal canal at T12-L1.  No marrow edema or evidence of acute osseous abnormality. Normal background bone marrow signal. Intact visible sacrum and SI joints.  Conus medullaris and cauda equina: Conus and lower spinal cord detail obscured by spinal cord stimulator susceptibility artifact.  Cauda equina nerve roots appear normal. No abnormal intradural enhancement and no dural thickening identified.  Paraspinal and other soft tissues: Subcutaneous spinal cord stimulator susceptibility artifact on the left. Postoperative changes to the lower lumbar paraspinal soft tissues with no adverse features. Negative visible abdominal viscera.  Disc levels:  Lower thoracic spine disc desiccation and mild disc bulging is evident. No lower thoracic spinal stenosis through T12-L1.  L1-L2: Broad-based small posterior disc bulge or protrusion. Mild facet hypertrophy. No stenosis.  L2-L3: Mild facet hypertrophy. Minor endplate spurring. No stenosis.  L3-L4: Posterior element ankylosis here as demonstrated by CT last year. Mild endplate spurring. No stenosis.  L4-L5: Prior decompression and fusion with solid arthrodesis as seen by CT last year. Mild osseous right L4 foraminal stenosis appears stable. Widely patent thecal sac.  L5-S1: Minimal disc bulging. Mild endplate spurring. Moderate to severe bilateral facet hypertrophy. No spinal or lateral recess stenosis. Mild to moderate bilateral L5 foraminal stenosis.  IMPRESSION: 1. Prior decompression and fusion at L4-L5 with solid arthrodesis there, and also at the L3-L4 posterior  elements as demonstrated by CT last year. Questionable developing facet ankylosis also at L2-L3.  2. 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 Mild to moderate bilateral L5 foraminal stenosis without spinal or lateral recess stenosis.  3. Mild disc bulging at L1-L2. Lower thoracic disc desiccation and disc bulging with susceptibility artifact from spinal stimulator device.   Electronically Signed By: HGenevie AnnM.D. On: 10/19/2020 06:37  Narrative CLINICAL DATA:  Failed back syndrome  with pain.  EXAM: LUMBAR SPINE - COMPLETE 4+ VIEW  COMPARISON:  CT of the abdomen and pelvis of April 14, 2020  FINDINGS: Post L4-5 spinal fusion with extensive spinal degenerative changes and spinal nerve stimulator in place as before.  No sign of acute fracture. Power pack for nerve stimulator over the LEFT back as before.  Signs of laminectomy at L4-5 similar to prior study along with interbody cage placement.  IMPRESSION: Unchanged appearance of L4-5 spinal fusion. Power pack for spinal nerve stimulator obscures partially the construct on the lateral view.  Degenerative changes elsewhere in the spine with similar appearance to the recent CT evaluation from April 14, 2020   Electronically Signed By: Zetta Bills M.D. On: 07/26/2020 16:26 DG HIP UNILAT WITH PELVIS 2-3 VIEWS RIGHT  Narrative CLINICAL DATA:  Right-sided hip pain  EXAM: DG HIP (WITH OR WITHOUT PELVIS) 2-3V RIGHT  COMPARISON:  None.  FINDINGS: Hardware in the lumbar spine. Penile prosthesis. Incompletely visualized left lower quadrant generator. Pubic symphysis and rami are intact. No fracture or malalignment. Mild bilateral hip arthritis.  IMPRESSION: Mild bilateral hip arthritis.   Electronically Signed By: Donavan Foil M.D. On: 08/07/2021 20:40 Complexity Note: Imaging results reviewed. Results shared with Brandon [name], using Layman's terms.                          ROS  Cardiovascular: Heart trouble, Daily Aspirin intake, High blood pressure, Chest pain, Heart catheterization, and Blood thinners:  Antiplatelet Pulmonary or Respiratory: Snoring  and Temporary stoppage of breathing during sleep Neurological: No reported neurological signs or symptoms such as seizures, abnormal skin sensations, urinary and/or fecal incontinence, being born with an abnormal open spine and/or a tethered spinal cord Psychological-Psychiatric: Anxiousness, Depressed, Prone to panicking, and Difficulty sleeping and or falling asleep Gastrointestinal: Inflamed liver (Hepatitis) and Irregular, infrequent bowel movements (Constipation) Genitourinary: No reported renal or genitourinary signs or symptoms such as difficulty voiding or producing urine, peeing blood, non-functioning kidney, kidney stones, difficulty emptying the bladder, difficulty controlling the flow of urine, or chronic kidney disease Hematological: No reported hematological signs or symptoms such as prolonged bleeding, low or poor functioning platelets, bruising or bleeding easily, hereditary bleeding problems, low energy levels due to low hemoglobin or being anemic Endocrine: High blood sugar controlled without the use of insulin (NIDDM) Rheumatologic: Joint aches and or swelling due to excess weight (Osteoarthritis) and Generalized muscle aches (Fibromyalgia) Musculoskeletal: Negative for myasthenia gravis, muscular dystrophy, multiple sclerosis or malignant hyperthermia Work History: Working full time  Allergies  Mr. Quebedeaux is allergic to kiwi extract, other, penicillins, peach [prunus persica], ancef [cefazolin], latex, and levaquin [levofloxacin].  Laboratory Chemistry Profile   Renal Lab Results  Component Value Date   BUN 12 01/18/2021   CREATININE 0.96 01/18/2021   BCR 14 01/02/2021   GFRAA >60 04/17/2020   GFRNONAA >60 01/18/2021   SPECGRAV 1.015 06/22/2020   PHUR 8.0 06/22/2020   PROTEINUR  30 (A) 04/14/2020     Electrolytes Lab Results  Component Value Date   NA 133 (L) 01/18/2021   K 3.9 01/18/2021   CL 101 01/18/2021   CALCIUM 8.2 (L) 01/18/2021   MG 1.9 04/16/2020     Hepatic Lab Results  Component Value Date   AST 20 10/23/2020   ALT 12 10/23/2020   ALBUMIN 4.5 10/23/2020   ALKPHOS 73 10/23/2020   LIPASE 38 08/12/2020     ID Lab Results  Component Value Date  HIV NON-REACTIVE 05/21/2021   SARSCOV2NAA NEGATIVE 08/11/2020     Bone Lab Results  Component Value Date   TESTOSTERONE 359 08/28/2021     Endocrine Lab Results  Component Value Date   GLUCOSE 120 (H) 09/19/2021   GLUCOSEU 50 (A) 04/14/2020   HGBA1C 6.4 (A) 09/04/2021   TSH 2.345 08/13/2020   TESTOSTERONE 359 08/28/2021     Neuropathy Lab Results  Component Value Date   VITAMINB12 335 05/21/2021   FOLATE 10.1 05/21/2021   HGBA1C 6.4 (A) 09/04/2021   HIV NON-REACTIVE 05/21/2021     CNS No results found for: COLORCSF, APPEARCSF, RBCCOUNTCSF, WBCCSF, POLYSCSF, LYMPHSCSF, EOSCSF, PROTEINCSF, GLUCCSF, JCVIRUS, CSFOLI, IGGCSF, LABACHR, ACETBL, LABACHR, ACETBL   Inflammation (CRP: Acute   ESR: Chronic) Lab Results  Component Value Date   CRP 1.5 (H) 08/13/2020   ESRSEDRATE 17 (H) 08/13/2020   LATICACIDVEN 1.3 04/14/2020     Rheumatology No results found for: RF, ANA, LABURIC, URICUR, LYMEIGGIGMAB, LYMEABIGMQN, HLAB27   Coagulation Lab Results  Component Value Date   INR 1.1 04/08/2020   LABPROT 14.2 04/08/2020   PLT 266 08/28/2021   DDIMER 0.31 08/11/2020     Cardiovascular Lab Results  Component Value Date   HGB 13.1 (L) 08/28/2021   HCT 43.1 08/28/2021     Screening Lab Results  Component Value Date   SARSCOV2NAA NEGATIVE 08/11/2020   HIV NON-REACTIVE 05/21/2021     Cancer No results found for: CEA, CA125, LABCA2   Allergens No results found for: ALMOND, APPLE, ASPARAGUS, AVOCADO, BANANA, BARLEY, BASIL, BAYLEAF, GREENBEAN, LIMABEAN, WHITEBEAN, BEEFIGE,  REDBEET, BLUEBERRY, BROCCOLI, CABBAGE, MELON, CARROT, CASEIN, CASHEWNUT, CAULIFLOWER, CELERY     Note: Lab results reviewed.  [place]  Drug: Brandon [name]  reports that he does not currently use drugs. Alcohol:  reports that he does not currently use alcohol. Tobacco:  reports that he quit smoking about 16 years ago. His smoking use included cigarettes. He has never used smokeless tobacco. Medical:  has a past medical history of Anxiety, Basal cell carcinoma (BCC) in situ of skin, Colon polyps, Coronary artery disease, Depression, Diabetes mellitus without complication ([place]), HLD (hyperlipidemia), Hypertension, and SBO (small bowel obstruction) ([place]). Family: family history includes Anxiety disorder in his father and mother; COPD in his mother; Cancer in his father; Depression in his father and mother; Other in his father.  Past Surgical History:  Procedure Laterality Date   BACK SURGERY     CARDIAC CATHETERIZATION     CATARACT EXTRACTION W/ INTRAOCULAR LENS  IMPLANT, BILATERAL     CHOLECYSTECTOMY     CHOLECYSTECTOMY, LAPAROSCOPIC     CORONARY STENT INTERVENTION N/A 08/15/2020   Procedure: CORONARY STENT INTERVENTION;  Surgeon: Jettie Booze, MD;  Location: [place] [facility];  Service: Cardiovascular;  Laterality: N/A;   CORONARY STENT INTERVENTION N/A 01/17/2021   Procedure: CORONARY STENT INTERVENTION;  Surgeon: Burnell Blanks, MD;  Location: [place];  Service: Cardiovascular;  Laterality: N/A;   EYE SURGERY     INTRAVASCULAR ULTRASOUND/IVUS N/A 08/15/2020   Procedure: Intravascular Ultrasound/IVUS;  Surgeon: Jettie Booze, MD;  Location: [place] [facility];  Service: Cardiovascular;  Laterality: N/A;   LEFT HEART CATH AND CORONARY ANGIOGRAPHY N/A 08/15/2020   Procedure: LEFT HEART CATH AND CORONARY ANGIOGRAPHY;  Surgeon: Jettie Booze, MD;  Location: [place];  Service: Cardiovascular;  Laterality: N/A;   LEFT HEART CATH AND CORONARY  ANGIOGRAPHY N/A 01/17/2021   Procedure: LEFT HEART CATH AND CORONARY ANGIOGRAPHY;  Surgeon: Burnell Blanks, MD;  Location: [place];  Service: Cardiovascular;  Laterality: N/A;   LUMBAR FUSION     L3-5 with rods   XI ROBOTIC ASSISTED INGUINAL HERNIA REPAIR WITH MESH     x 3 surgeries   Active Ambulatory Problems    Diagnosis Date Noted   Primary hypertension 04/07/2020   Type 2 diabetes mellitus with other specified complication ([place]) 08/18/8249   Hyperlipidemia associated with type 2 diabetes mellitus ([place]) 04/14/2020   Sepsis ([place]) 10/06/2019   Spinal cord stimulator status 05/19/2017   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   Erectile dysfunction 10/09/2015   Major depression in partial remission ([place]) 06/04/2020   Lumbar spondylosis 06/22/2020   Chest pain 08/11/2020   Coronary artery disease    Status post coronary artery stent placement    Mixed hyperlipidemia    Insomnia 09/03/2020   Vertigo 11/28/2020   Myelopathy ([place]) 11/28/2020   Unstable angina ([facility])    Acute pain of left shoulder 04/03/2021   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   Thrush 03/70/4888   Metallic taste 91/69/4503   OSA (obstructive sleep apnea) 05/28/2021   Chronic pain syndrome 06/01/2021   History of lumbar fusion (L4/5) 10/02/2021   Post laminectomy syndrome 10/02/2021   Resolved Ambulatory Problems    Diagnosis Date Noted   SBO (small bowel obstruction) ([place]) 04/06/2020   Encounter for imaging study to confirm nasogastric (NG) tube placement    Nausea and vomiting 04/14/2020   Past Medical History:  Diagnosis Date   Anxiety    Basal cell carcinoma (BCC) in situ of skin    Colon polyps    Depression    Diabetes mellitus without complication (HCC)    HLD (hyperlipidemia)    Hypertension    Constitutional Exam  General appearance: Well nourished, well developed, and well hydrated. In no apparent acute distress Vitals:   10/02/21 1000  BP: 111/70  Pulse: 81  Resp: 18  Temp:  (!) 97.2 F (36.2 C)  TempSrc: Temporal  SpO2: 99%  Weight: 197 lb (89.4 kg)  Height: '5\' 9"'  (1.753 m)   BMI Assessment: Estimated body mass index is 29.09 kg/m as calculated from the following:   Height as of this encounter: '5\' 9"'  (1.753 m).   Weight as of this encounter: 197 lb (89.4 kg).  BMI interpretation table: BMI level Category Range association with higher incidence of chronic pain  <18 kg/m2 Underweight   18.5-24.9 kg/m2 Ideal body weight   25-29.9 kg/m2 Overweight Increased incidence by 20%  30-34.9 kg/m2 Obese (Class I) Increased incidence by 68%  35-39.9 kg/m2 Severe obesity (Class II) Increased incidence by 136%  >40 kg/m2 Extreme obesity (Class III) Increased incidence by 254%   Patient's current BMI Ideal Body weight  Body mass index is 29.09 kg/m. Ideal body weight: 70.7 kg (155 lb 13.8 oz) Adjusted ideal body weight: 78.2 kg (172 lb 5.1 oz)   BMI Readings from Last 4 Encounters:  10/02/21 29.09 kg/m  09/04/21 29.68 kg/m  08/28/21 29.53 kg/m  07/17/21 30.36 kg/m   Wt Readings from Last 4 Encounters:  10/02/21 197 lb (89.4 kg)  09/04/21 201 lb (91.2 kg)  08/28/21 200 lb (90.7 kg)  07/17/21 205 lb 9.6 oz (93.3 kg)    Psych/Mental status: Alert, oriented x 3 (person, place, & time)       Eyes: PERLA Respiratory: No evidence of acute respiratory distress  Thoracic Spine Area Exam  Skin & Axial Inspection: Well healed scar from previous  spine surgery detected Alignment: Symmetrical Functional ROM: Pain restricted ROM Stability: No instability detected Muscle Tone/Strength: Functionally intact. No obvious neuro-muscular anomalies detected. Sensory (Neurological): Neurogenic pain pattern Muscle strength & Tone: No palpable anomalies Lumbar Spine Area Exam  Skin & Axial Inspection: Well healed scar from previous spine surgery detected Alignment: Symmetrical Functional ROM: Pain restricted ROM affecting both sides Stability: No instability  detected Muscle Tone/Strength: Functionally intact. No obvious neuro-muscular anomalies detected. Sensory (Neurological): Dermatomal pain pattern + Lumbar facet extension positive for pain consistent with facet syndrome Palpation: IPG present  Gait & Posture Assessment  Ambulation: Limited Gait: Antalgic Posture: Difficulty standing up straight, due to pain   Lower Extremity Exam    Side: Right lower extremity  Side: Left lower extremity  Stability: No instability observed          Stability: No instability observed          Skin & Extremity Inspection: Skin color, temperature, and hair growth are WNL. No peripheral edema or cyanosis. No masses, redness, swelling, asymmetry, or associated skin lesions. No contractures.  Skin & Extremity Inspection: Skin color, temperature, and hair growth are WNL. No peripheral edema or cyanosis. No masses, redness, swelling, asymmetry, or associated skin lesions. No contractures.  Functional ROM: Pain restricted ROM for hip and knee joints          Functional ROM: Pain restricted ROM for hip and knee joints          Muscle Tone/Strength: Functionally intact. No obvious neuro-muscular anomalies detected.  Muscle Tone/Strength: Functionally intact. No obvious neuro-muscular anomalies detected.  Sensory (Neurological): Arthropathic arthralgia        Sensory (Neurological): Arthropathic arthralgia        DTR: Patellar: deferred today Achilles: deferred today Plantar: deferred today  DTR: Patellar: deferred today Achilles: deferred today Plantar: deferred today  Palpation: No palpable anomalies  Palpation: No palpable anomalies    Assessment  Primary Diagnosis & Pertinent Problem List: The primary encounter diagnosis was Lumbar facet arthropathy. Diagnoses of Lumbar spondylosis, History of lumbar fusion (L4/5), Failed back surgical syndrome, Post laminectomy syndrome, Spinal cord stimulator status ([place]), and Chronic pain syndrome were also pertinent to  this visit.  Visit Diagnosis (New problems to examiner): 1. Lumbar facet arthropathy   2. Lumbar spondylosis   3. History of lumbar fusion (L4/5)   4. Failed back surgical syndrome   5. Post laminectomy syndrome   6. Spinal cord stimulator status ([place])   7. Chronic pain syndrome    Plan of Care (Initial workup plan)  Note: Brandon [name] was reminded that as per protocol, today's visit has been an evaluation only. We have not taken over the patient's controlled substance management.  General Recommendations: The pain condition that the patient suffers from is best treated with a multidisciplinary approach that involves an increase in physical activity to prevent de-conditioning and worsening of the pain cycle, as well as psychological counseling (formal and/or informal) to address the co-morbid psychological affects of pain. Treatment will often involve judicious use of pain medications and interventional procedures to decrease the pain, allowing the patient to participate in the physical activity that will ultimately produce long-lasting pain reductions. The goal of the multidisciplinary approach is to return the patient to a higher level of overall function and to restore their ability to perform activities of daily living.  Lab Orders         Compliance Drug Analysis, Ur    -Can take over patient's Butrans patch after  urine toxicology screen.   Brandon [name] has a history of greater than 3 months of moderate to severe pain which is resulted in functional impairment.  The patient has tried various conservative therapeutic options such as NSAIDs, Tylenol, muscle relaxants, physical therapy which was inadequately effective.  Patient's pain is predominantly axial with physical exam and MRI  findings suggestive of facet arthropathy. Lumbar facet medial branch nerve blocks were discussed with the patient.  Risks and benefits were reviewed.  Patient would like to proceed with bilateral L3, L4, L5  medial branch nerve block.  We will need cardiac clearance to stop his Plavix 7 days prior to procedure.  Patient is a moderate to high risk to be off Plavix given his prior coronary artery disease history and history of 3 drug-eluting stents with this most recent one placed last June 2022.  Procedure Orders         LUMBAR FACET(MEDIAL BRANCH NERVE BLOCK) MBNB     -Continue with spinal cord stimulator    Pharmacological management options:  Opioid Analgesics: The patient was informed that there is no guarantee that he would be a candidate for opioid analgesics. The decision will be made following CDC guidelines. This decision will be based on the results of diagnostic studies, as well as Brandon [name] risk profile. Butrans patch 15 mcg/hr  Membrane stabilizer: Tried and failed gabapentin, Lyrica, Cymbalta, TCAs  Muscle relaxant:  Currently on tizanidine.  Has failed Flexeril and Robaxin past  NSAID: Medically contraindicated currently on Plavix for coronary artery disease status post 3 coronary stents  Other analgesic(s): To be determined at a later time   Interventional management options: Mr. Friedt was informed that there is no guarantee that he would be a candidate for interventional therapies. The decision will be based on the results of diagnostic studies, as well as Mr. Shugart risk profile.  Procedure(s) under consideration:  Lumbar facet medial branch nerve blocks Lumbar radiofrequency ablation Sprint L4 medial branch peripheral nerve stimulation    Provider-requested follow-up: Return in about 9 days (around 10/11/2021) for Medication Management, in person. I spent a total of 60 minutes reviewing chart data, face-to-face evaluation with the patient, counseling and coordination of care as detailed above.   Future Appointments  Date Time Provider [place]  10/11/2021 11:40 AM Gillis Santa, MD [facility] None  11/01/2021 10:40 AM Sueanne Margarita, MD [facility]  12/10/2021 11:00 AM Renato Shin, MD [facility] None    Note by: Gillis Santa, MD Date: 10/02/2021; Time: 1:27 PM

## 2021-10-02 NOTE — Progress Notes (Signed)
Safety precautions to be maintained throughout the outpatient stay will include: orient to surroundings, keep bed in low position, maintain call bell within reach at all times, provide assistance with transfer out of bed and ambulation.  

## 2021-10-03 NOTE — Telephone Encounter (Signed)
Agree with Kriste Basque that if possible to delay the procedure until May, that is probably the safest way to proceed.

## 2021-10-03 NOTE — Telephone Encounter (Signed)
TAlked with patient about the possibilty of OmniPod pump since it is a prescription item.  He did not want this.  Today paperwork came from [place] for LMN for tandem pump.  Paperwork filled out and faxed back to [place].  Pt. Notified of this.

## 2021-10-03 NOTE — Progress Notes (Signed)
Clearance request form to stop Plavix for 7 days faxed on 10/03/21 to office [facility] at [phone] ATTN: PRE OP TEAM per office request.   Earlyne Iba, RN

## 2021-10-03 NOTE — Telephone Encounter (Signed)
° °  Pre-operative Risk Assessment    Patient Name: Brandon [name]  DOB: 12-10-1955 MRN: [id number]      Request for Surgical Clearance    Procedure:   LUMBAR FACET BLOCK  Date of Surgery:  Clearance TBD                                 Surgeon:  DR. BILAL LATEEF Surgeon's Group or Practice Name:  [facility] Phone number:  [phone] Fax number:  [phone]   Type of Clearance Requested:   - Medical  - Pharmacy:  Hold Clopidogrel (Plavix) x 7 DAYS PRIOR TO PROCEDURE   Type of Anesthesia:   IV VERSED   Additional requests/questions:    [name]   10/03/2021, 3:53 PM

## 2021-10-03 NOTE — Telephone Encounter (Signed)
° °  Patient Name: Brandon [name]  DOB: 10/11/1955 MRN: [id number]  Primary Cardiologist: Meriam Sprague, MD  Chart reviewed as part of pre-operative protocol coverage. Cardiology asked to stop Plavix for lumbar facet injection. Patient has hx of multivessel CAD with most recent PCI in 12/2020 with DES x 2 to Cx with recommendation to continue DAPT for at least 1 year. He is still <12 months from PCI. Also with multiple comorbidities in last note including HTN, DM, HLD, obesity, OSA. Will route to Dr. Shari Prows for input since he is <12 months from PCI.  Has f/u scheduled 01/2022 with Dr. Shari Prows, query whether we push this back to 12/2021 to discuss coming off Plavix for this procedure if he is not cleared above.  Laurann Montana, PA-C 10/03/2021, 5:25 PM

## 2021-10-03 NOTE — Telephone Encounter (Signed)
LMN for Tandem pump faxed to [facility] health care to ship to patient

## 2021-10-04 ENCOUNTER — Telehealth: Payer: Self-pay

## 2021-10-04 NOTE — Telephone Encounter (Signed)
I spoke with the patient.  He says the procedure is not urgent and he is willing to wait until after completing the 1 year course of aspirin and Plavix before start to hold her Plavix therapy.  Since his procedure was on 01/17/2021, the earliest date he can potentially hold Plavix will be 01/17/2022.  With 7-day holding time, the earliest time he can proceed with injection would be on 01/25/2022.  He denies any chest pain or worsening dyspnea, if it is ok with you, I would like to go ahead and cleared the patient for the procedure to be done in early June.  Of course, I have informed the patient that if he has any changes in his symptoms, he should contact cardiology service and the let us know.  Please forward your response to [facility] preop

## 2021-10-04 NOTE — Telephone Encounter (Signed)
Call to patient regarding the recommendations from his cardiologist office. Patient is aware to continue Plavix until end of May and that procedure can be scheduled around early June. Prior authorization has been sent for procedure. Patient reminded of f/u appointment with Dr. Benito Mccreedy at the pain clinic February 16th. Patient verbalized understanding.   Dr. Cherylann Ratel also made aware of above recommendations.   Earlyne Iba, RN

## 2021-10-08 NOTE — Telephone Encounter (Signed)
° °  Name: Brandon [name]  DOB: 1956/06/30  MRN: [id number]  Primary Cardiologist: Freada Bergeron, MD  Chart reviewed as part of pre-operative protocol coverage. Because of Terelle Fondren Niess's past medical history and time since last visit, he will require a follow-up visit in order to better assess preoperative cardiovascular risk.  As outlined below, given recent PCI 12/2020, the earliest date he could potentially begin holding Plavix would be after 01/17/22. He has a 6 month follow-up scheduled in 01/2022. Would recommend we push back his appt with Dr. Johney Frame closer to end of May 2023 so this clearance can be addressed at that time, as well as the long-term plan for antiplatelet therapy.  Pre-op covering staff: - Please schedule appointment and call patient to inform them. Make sure to include preop FYI in appt notes. - Please contact requesting surgeon's office via preferred method (i.e, phone, fax) to inform them of need for appointment prior to surgery.  Charlie Pitter, PA-C  10/08/2021, 8:57 AM

## 2021-10-08 NOTE — Telephone Encounter (Signed)
Patient made aware and voiced understanding.   Copy of recommendations have been sent over to requesting providers office.

## 2021-10-09 ENCOUNTER — Telehealth: Payer: Self-pay

## 2021-10-09 NOTE — Telephone Encounter (Signed)
I have the authorization for his procedure. Do we have clearance for him to stop his plavix for 7 days?

## 2021-10-09 NOTE — Telephone Encounter (Signed)
Yes if you dont mind. I will have to redo the authorization to start it 5/25. The one I have expires 5/8

## 2021-10-09 NOTE — Telephone Encounter (Signed)
No, according to Dr. Devin Going notes, Plavix cannot be held until after 01-17-22. Do you want me to call patient to tell him?

## 2021-10-10 LAB — COMPLIANCE DRUG ANALYSIS, UR

## 2021-10-11 ENCOUNTER — Encounter: Payer: Self-pay | Admitting: Student in an Organized Health Care Education/Training Program

## 2021-10-11 ENCOUNTER — Other Ambulatory Visit: Payer: Self-pay

## 2021-10-11 ENCOUNTER — Other Ambulatory Visit (HOSPITAL_BASED_OUTPATIENT_CLINIC_OR_DEPARTMENT_OTHER): Payer: Self-pay

## 2021-10-11 ENCOUNTER — Ambulatory Visit
Payer: No Typology Code available for payment source | Attending: Student in an Organized Health Care Education/Training Program | Admitting: Student in an Organized Health Care Education/Training Program

## 2021-10-11 VITALS — BP 109/66 | HR 81 | Temp 97.5°F | Ht 69.0 in | Wt 195.0 lb

## 2021-10-11 DIAGNOSIS — Z0289 Encounter for other administrative examinations: Secondary | ICD-10-CM

## 2021-10-11 DIAGNOSIS — Z9689 Presence of other specified functional implants: Secondary | ICD-10-CM

## 2021-10-11 DIAGNOSIS — G894 Chronic pain syndrome: Secondary | ICD-10-CM

## 2021-10-11 DIAGNOSIS — M961 Postlaminectomy syndrome, not elsewhere classified: Secondary | ICD-10-CM | POA: Diagnosis present

## 2021-10-11 DIAGNOSIS — Z981 Arthrodesis status: Secondary | ICD-10-CM

## 2021-10-11 DIAGNOSIS — M47816 Spondylosis without myelopathy or radiculopathy, lumbar region: Secondary | ICD-10-CM

## 2021-10-11 MED ORDER — BUPRENORPHINE 20 MCG/HR TD PTWK
1.0000 | MEDICATED_PATCH | TRANSDERMAL | 3 refills | Status: DC
  Filled 2021-10-11: qty 4, 28d supply, fill #0
  Filled 2021-11-08: qty 4, 28d supply, fill #1
  Filled 2021-12-02 – 2021-12-11 (×2): qty 4, 28d supply, fill #2
  Filled 2022-01-04: qty 4, 28d supply, fill #3

## 2021-10-11 NOTE — Progress Notes (Signed)
Nursing Pain Medication Assessment:  Safety precautions to be maintained throughout the outpatient stay will include: orient to surroundings, keep bed in low position, maintain call bell within reach at all times, provide assistance with transfer out of bed and ambulation.  Medication Inspection Compliance: Pill count conducted under aseptic conditions, in front of the patient. Neither the pills nor the bottle was removed from the patient's sight at any time. Once count was completed pills were immediately returned to the patient in their original bottle.  Medication: Buprenorphine (Suboxone) Pill/Patch Count:  0 of 4 pills remain Pill/Patch Appearance: Markings consistent with prescribed medication Bottle Appearance: Standard pharmacy container. Clearly labeled. Filled Date: 1 / 20 / 2023 Last Medication intake:   10/05/21 Safety precautions to be maintained throughout the outpatient stay will include: orient to surroundings, keep bed in low position, maintain call bell within reach at all times, provide assistance with transfer out of bed and ambulation.

## 2021-10-11 NOTE — Progress Notes (Signed)
PROVIDER NOTE: Information contained herein reflects review and annotations entered in association with encounter. Interpretation of such information and data should be left to medically-trained personnel. Information provided to patient can be located elsewhere in the medical record under "Patient Instructions". Document created using STT-dictation technology, any transcriptional errors that may result from process are unintentional.    Patient: Brandon [name]  Service Category: E/M  Provider: Gillis Santa, MD  DOB: 1956-01-11  DOS: 10/11/2021  Specialty: Interventional Pain Management  MRN: [id number]  Setting: Ambulatory outpatient  PCP: Luetta Nutting, DO  Type: Established Patient    Referring Provider: Luetta Nutting, DO  Location: Office  Delivery: Face-to-face     HPI  Brandon [name], a 66 y.o. year old male, is here today because of his Lumbar facet arthropathy [M47.816]. Brandon [name] primary complain today is Back Pain (Lower on right side) Last encounter: My last encounter with him was on 10/02/2021. Pertinent problems: Brandon [name] has Type 2 diabetes mellitus with other specified complication ([place]); Lumbar facet arthropathy; Coronary artery disease; Acute pain of left shoulder; Sacroiliac joint disease; Chronic pain syndrome; History of lumbar fusion (L4/5); Failed back surgical syndrome; and Pain management contract signed on their pertinent problem list. Pain Assessment: Severity of Chronic pain is reported as a 4 /10. Location: Back Right/Denies. Onset: More than a month ago. Quality: Sharp, Aching, Dull, Constant. Timing: Constant. Modifying factor(s): patches helps little bit. Vitals:  height is _0  (1.753 m) and weight is 195 lb (88.5 kg). His temperature is 97.5 F (36.4 C) (abnormal). His blood pressure is 109/66 and his pulse is 81. His oxygen saturation is 100%.   Reason for encounter: medication management.    Given his cardiac history and history of drug-eluting stents,  patient did not get clearance to stop his dual antiplatelet therapy for his spinal injections they will have to wait until July to reassess.  Patient is indeed high risk to be off of his Plavix.  For this reason we will hold off on any spinal injections.  In the interim, we discussed increasing his buprenorphine transdermal patch from 15 mcg an hour to 20 mcg an hour.  I will see the patient back in June to see how he is doing and to hopefully schedule bilateral L3, L5 medial branch nerve blocks for lumbar facet arthropathy and spondylosis.  I will have him sign pain contract today.  Zyere is a pleasant 66 year old male who presents with a chief complaint of low back pain with radiation into his right hip and right buttock.  Martese recently moved from [place] with his wife where he was for the last 6 years.  He was seeing pain management there.  He has a history of L4-L5 decompression and fusion for disc herniations that were resulting in leg pain.  He has had multiple spinal injections including epidural steroid injections as well as a spinal cord stimulator that was implanted in [place] by his pain physician.  He has a Nevro spinal cord stimulator in place.  He states that he is experiencing not as great analgesic benefit with the stimulator as he was in [place].  He has difficulty walking for extended period of time without having pain in his lower back that radiates into his buttock region.  He is status post bilateral SI joint injection with ultrasound guidance which unfortunately was not effective.  He is being referred here to consider lumbar facet medial branch nerve blocks as well as radiofrequency ablation for facet  arthritis and associated lumbar spondylosis secondary to adjacent segment disease in the context of having a lumbar spinal fusion.   In regards to medication management, he has failed various neuropathic's including gabapentin, Lyrica, Cymbalta, TCAs.  He takes tizanidine as needed and also  finds benefit with Butrans patch at 15 mcg an hour.  He also utilizes a lidocaine patch.  Cardiac history significant for coronary artery disease status post 3 drug-eluting stents with the most recent 1 placed June 2022.  He is being followed by cardiology.     Pharmacotherapy Assessment  Analgesic: Butrans 20 mcg/hr   Monitoring: [place] PMP: PDMP reviewed during this encounter.       Pharmacotherapy: No side-effects or adverse reactions reported. Compliance: No problems identified. Effectiveness: Clinically acceptable.  Chauncey Fischer, RN  10/11/2021 10:42 AM  Sign when Signing Visit Nursing Pain Medication Assessment:  Safety precautions to be maintained throughout the outpatient stay will include: orient to surroundings, keep bed in low position, maintain call bell within reach at all times, provide assistance with transfer out of bed and ambulation.  Medication Inspection Compliance: Pill count conducted under aseptic conditions, in front of the patient. Neither the pills nor the bottle was removed from the patient's sight at any time. Once count was completed pills were immediately returned to the patient in their original bottle.  Medication: Buprenorphine (Suboxone) Pill/Patch Count:  0 of 4 pills remain Pill/Patch Appearance: Markings consistent with prescribed medication Bottle Appearance: Standard pharmacy container. Clearly labeled. Filled Date: 1 / 20 / 2023 Last Medication intake:   10/05/21 Safety precautions to be maintained throughout the outpatient stay will include: orient to surroundings, keep bed in low position, maintain call bell within reach at all times, provide assistance with transfer out of bed and ambulation.      UDS:  Summary  Date Value Ref Range Status  10/02/2021 Note  Final    Comment:    ==================================================================== Compliance Drug Analysis,  Ur ==================================================================== Test                             Result       Flag       Units  Drug Present and Declared for Prescription Verification   Buprenorphine                  8            EXPECTED   ng/mg creat   Norbuprenorphine               6            EXPECTED   ng/mg creat    Source of buprenorphine is a scheduled prescription medication.    Norbuprenorphine is an expected metabolite of buprenorphine.    Tizanidine                     PRESENT      EXPECTED   Bupropion                      PRESENT      EXPECTED   Hydroxybupropion               PRESENT      EXPECTED    Hydroxybupropion is an expected metabolite of bupropion.    Aripiprazole  PRESENT      EXPECTED   Metoprolol                     PRESENT      EXPECTED  Drug Present not Declared for Prescription Verification   Acetaminophen                  PRESENT      UNEXPECTED  Drug Absent but Declared for Prescription Verification   Alpha-hydroxytriazolam         Not Detected UNEXPECTED ng/mg creat   Tramadol                       Not Detected UNEXPECTED ng/mg creat   Trazodone                      Not Detected UNEXPECTED   Salicylate                     Not Detected UNEXPECTED    Aspirin, as indicated in the declared medication list, is not always    detected even when used as directed.    Diphenhydramine                Not Detected UNEXPECTED   Lidocaine                      Not Detected UNEXPECTED    Lidocaine, as indicated in the declared medication list, is not    always detected even when used as directed.  ==================================================================== Test                      Result    Flag   Units      Ref Range   Creatinine              99               mg/dL      >=20 ==================================================================== Declared Medications:  The flagging and interpretation on this report are based on  the  following declared medications.  Unexpected results may arise from  inaccuracies in the declared medications.   **Note: The testing scope of this panel includes these medications:   Aripiprazole (Abilify)  Bupropion (Wellbutrin)  Diphenhydramine  Metoprolol (Toprol)  Tramadol (Ultram)  Trazodone (Desyrel)  Triazolam (Halcion)   **Note: The testing scope of this panel does not include small to  moderate amounts of these reported medications:   Aspirin  Buprenorphine Patch (BuTrans)  Lidocaine  Tizanidine (Zanaflex)  Topical Lidocaine (Lidoderm)   **Note: The testing scope of this panel does not include the  following reported medications:   Amlodipine (Norvasc)  Atorvastatin (Lipitor)  Clopidogrel (Plavix)  Empagliflozin (Jardiance)  Insulin (Humalog)  Isosorbide (Imdur)  Losartan (Cozaar)  Metoclopramide (Reglan)  Nitroglycerin (Nitrostat)  Nystatin (Mycostatin)  Ondansetron (Zofran)  Pantoprazole (Protonix)  Prednisolone  Semaglutide (Ozempic)  Tamsulosin (Flomax)  Testosterone ==================================================================== For clinical consultation, please call [phone]. ====================================================================      ROS  Constitutional: Denies any fever or chills Gastrointestinal: No reported hemesis, hematochezia, vomiting, or acute GI distress Musculoskeletal:  LBP Neurological: No reported episodes of acute onset apraxia, aphasia, dysarthria, agnosia, amnesia, paralysis, loss of coordination, or loss of consciousness  Medication Review  ARIPiprazole, Dexcom G6 Sensor, Semaglutide (2 MG/DOSE), amLODipine, aspirin, atorvastatin, b complex vitamins, buPROPion, buprenorphine, clopidogrel, empagliflozin, glucose blood, insulin lispro, isosorbide mononitrate, lidocaine,  losartan, metoCLOPramide, metoprolol succinate, nitroGLYCERIN, nystatin, omega-3 acid ethyl esters, ondansetron, pantoprazole,  prednisoLONE-lidocaine-nystatin-diphenhydrAMINE-alum & mag hydroxide-simeth, tamsulosin, testosterone, tiZANidine, traMADol, traZODone, and triazolam  History Review  Allergy: Brandon [name] is allergic to kiwi extract, other, penicillins, peach [prunus persica], ancef [cefazolin], latex, and levaquin [levofloxacin]. Drug: Brandon [name]  reports that he does not currently use drugs. Alcohol:  reports that he does not currently use alcohol. Tobacco:  reports that he quit smoking about 16 years ago. His smoking use included cigarettes. He has never used smokeless tobacco. Social: Brandon [name]  reports that he quit smoking about 16 years ago. His smoking use included cigarettes. He has never used smokeless tobacco. He reports that he does not currently use alcohol. He reports that he does not currently use drugs. Medical:  has a past medical history of Anxiety, Basal cell carcinoma (BCC) in situ of skin, Colon polyps, Coronary artery disease, Depression, Diabetes mellitus without complication ([place]), HLD (hyperlipidemia), Hypertension, and SBO (small bowel obstruction) ([place]). Surgical: Brandon [name]  has a past surgical history that includes Back surgery; Eye surgery; Cholecystectomy; Cholecystectomy, laparoscopic; Cataract extraction w/ intraocular lens  implant, bilateral; Lumbar fusion; XI Robotic assisted inguinal hernia repair with mesh; LEFT HEART CATH AND CORONARY ANGIOGRAPHY (N/A, 08/15/2020); Intravascular Ultrasound/IVUS (N/A, 08/15/2020); CORONARY STENT INTERVENTION (N/A, 08/15/2020); Cardiac catheterization; LEFT HEART CATH AND CORONARY ANGIOGRAPHY (N/A, 01/17/2021); and CORONARY STENT INTERVENTION (N/A, 01/17/2021). Family: family history includes Anxiety disorder in his father and mother; COPD in his mother; Cancer in his father; Depression in his father and mother; Other in his father.  Laboratory Chemistry Profile   Renal Lab Results  Component Value Date   BUN 12 01/18/2021   CREATININE 0.96  01/18/2021   BCR 14 01/02/2021   GFRAA >60 04/17/2020   GFRNONAA >60 01/18/2021    Hepatic Lab Results  Component Value Date   AST 20 10/23/2020   ALT 12 10/23/2020   ALBUMIN 4.5 10/23/2020   ALKPHOS 73 10/23/2020   LIPASE 38 08/12/2020    Electrolytes Lab Results  Component Value Date   NA 133 (L) 01/18/2021   K 3.9 01/18/2021   CL 101 01/18/2021   CALCIUM 8.2 (L) 01/18/2021   MG 1.9 04/16/2020    Bone Lab Results  Component Value Date   TESTOSTERONE 359 08/28/2021    Inflammation (CRP: Acute Phase) (ESR: Chronic Phase) Lab Results  Component Value Date   CRP 1.5 (H) 08/13/2020   ESRSEDRATE 17 (H) 08/13/2020   LATICACIDVEN 1.3 04/14/2020         Note: Above Lab results reviewed.  Recent Imaging Review  DG HIP UNILAT WITH PELVIS 2-3 VIEWS RIGHT CLINICAL DATA:  Right-sided hip pain  EXAM: DG HIP (WITH OR WITHOUT PELVIS) 2-3V RIGHT  COMPARISON:  None.  FINDINGS: Hardware in the lumbar spine. Penile prosthesis. Incompletely visualized left lower quadrant generator. Pubic symphysis and rami are intact. No fracture or malalignment. Mild bilateral hip arthritis.  IMPRESSION: Mild bilateral hip arthritis.  Electronically Signed   By: Donavan Foil M.D.   On: 08/07/2021 20:40 Note: Reviewed        Physical Exam  General appearance: Well nourished, well developed, and well hydrated. In no apparent acute distress Mental status: Alert, oriented x 3 (person, place, & time)       Respiratory: No evidence of acute respiratory distress Eyes: PERLA Vitals: BP 109/66    Pulse 81    Temp (!) 97.5 F (36.4 C)    Ht _0  (1.753 m)  Wt 195 lb (88.5 kg)    SpO2 100%    BMI 28.80 kg/m  BMI: Estimated body mass index is 28.8 kg/m as calculated from the following:   Height as of this encounter: _0  (1.753 m).   Weight as of this encounter: 195 lb (88.5 kg). Ideal: Ideal body weight: 70.7 kg (155 lb 13.8 oz) Adjusted ideal body weight: 77.8 kg (171 lb 8.3 oz)     Thoracic Spine Area Exam  Skin & Axial Inspection: Well healed scar from previous spine surgery detected Alignment: Symmetrical Functional ROM: Pain restricted ROM Stability: No instability detected Muscle Tone/Strength: Functionally intact. No obvious neuro-muscular anomalies detected. Sensory (Neurological): Neurogenic pain pattern Muscle strength & Tone: No palpable anomalies  Lumbar Spine Area Exam  Skin & Axial Inspection: Well healed scar from previous spine surgery detected Alignment: Symmetrical Functional ROM: Pain restricted ROM affecting both sides Stability: No instability detected Muscle Tone/Strength: Functionally intact. No obvious neuro-muscular anomalies detected. Sensory (Neurological): Dermatomal pain pattern + Lumbar facet extension positive for pain consistent with facet syndrome Palpation: IPG present   Gait & Posture Assessment  Ambulation: Limited Gait: Antalgic Posture: Difficulty standing up straight, due to pain    Lower Extremity Exam      Side: Right lower extremity   Side: Left lower extremity  Stability: No instability observed           Stability: No instability observed          Skin & Extremity Inspection: Skin color, temperature, and hair growth are WNL. No peripheral edema or cyanosis. No masses, redness, swelling, asymmetry, or associated skin lesions. No contractures.   Skin & Extremity Inspection: Skin color, temperature, and hair growth are WNL. No peripheral edema or cyanosis. No masses, redness, swelling, asymmetry, or associated skin lesions. No contractures.  Functional ROM: Pain restricted ROM for hip and knee joints           Functional ROM: Pain restricted ROM for hip and knee joints          Muscle Tone/Strength: Functionally intact. No obvious neuro-muscular anomalies detected.   Muscle Tone/Strength: Functionally intact. No obvious neuro-muscular anomalies detected.  Sensory (Neurological): Arthropathic arthralgia         Sensory  (Neurological): Arthropathic arthralgia        DTR: Patellar: deferred today Achilles: deferred today Plantar: deferred today   DTR: Patellar: deferred today Achilles: deferred today Plantar: deferred today  Palpation: No palpable anomalies   Palpation: No palpable anomalies     Assessment   Status Diagnosis  Controlled Controlled Controlled 1. Lumbar facet arthropathy   2. Lumbar spondylosis   3. History of lumbar fusion (L4/5)   4. Failed back surgical syndrome   5. Post laminectomy syndrome   6. Pain management contract signed   7. Spinal cord stimulator status ([place])   8. Chronic pain syndrome      Updated Problems: Problem  Pain Management Contract Signed  History of lumbar fusion (L4/5)  Failed Back Surgical Syndrome  Chronic Pain Syndrome  Sacroiliac Joint Disease  Acute Pain of Left Shoulder  Coronary Artery Disease   S/p DES to mRCA in 12/21 // S/p DES to LCx x 2 in 12/2020 // Diff dz in small to mod Dx (not a good target for PCI); OM1 100 CTO; severe dz in small OM2 and PDA >> Med Rx   Lumbar Facet Arthropathy   I did review records from his pain management treatment in [place], he is post  L3-S1 fusion, he has had lumbar facet medial branch blocks 01/21/2017 L3-S1, he has had sacroiliac joint injections on several occasions.   Type 2 Diabetes Mellitus With Other Specified Complication ([place])    Plan of Care    Brandon [name] has a current medication list which includes the following long-term medication(s): amlodipine, aripiprazole, atorvastatin, bupropion, bupropion, insulin lispro, isosorbide mononitrate, losartan, metoclopramide, metoprolol succinate, nitroglycerin, omega-3 acid ethyl esters, pantoprazole, testosterone, tizanidine, trazodone, tramadol, and triazolam.  Pharmacotherapy (Medications Ordered): Meds ordered this encounter  Medications   buprenorphine (BUTRANS) 20 MCG/HR PTWK    Sig: Place 1 patch onto the skin once a week.    Dispense:   4 patch    Refill:  3    Chronic Pain: STOP Act (Not applicable) Fill 1 day early if closed on refill date. Avoid benzodiazepines within 8 hours of opioids    Revisit lumbar facet medial branch nerve blocks, RFA in June as the patient will need to stop his Plavix and he will be 12 months after his last drug-eluting stent.  Follow-up plan:   Return in about 4 months (around 02/08/2022) for Medication Management, in person.    Recent Visits Date Type Provider Dept  10/02/21 Office Visit Gillis Santa, MD [facility]  Showing recent visits within past 90 days and meeting all other requirements Today's Visits Date Type Provider Dept  10/11/21 Office Visit Gillis Santa, MD [facility]  Showing today's visits and meeting all other requirements Future Appointments No visits were found meeting these conditions. Showing future appointments within next 90 days and meeting all other requirements  I discussed the assessment and treatment plan with the patient. The patient was provided an opportunity to ask questions and all were answered. The patient agreed with the plan and demonstrated an understanding of the instructions.  Patient advised to call back or seek an in-person evaluation if the symptoms or condition worsens.  Duration of encounter: 67mnutes.  Note by: BGillis Santa MD Date: 10/11/2021; Time: 11:50 AM

## 2021-10-12 ENCOUNTER — Other Ambulatory Visit (HOSPITAL_BASED_OUTPATIENT_CLINIC_OR_DEPARTMENT_OTHER): Payer: Self-pay

## 2021-10-12 NOTE — Telephone Encounter (Signed)
Thank you for reaching out to [place]. We cant mail the pain contract to you, It has to be witnessed when you sign it. Please when back in the office let us know and we will be happy to make sure the appropiate paper is signed.

## 2021-10-15 ENCOUNTER — Telehealth: Payer: Self-pay | Admitting: Cardiology

## 2021-10-15 NOTE — Telephone Encounter (Signed)
Pt called with muscle spasm like pain over chest, he was going to try 1 NTG and then come to ER by EMS.  Unsure if cardiac, but has significant hx of CAD.    Triage please check with pt 10/16/21 to see how he is.

## 2021-10-16 NOTE — Telephone Encounter (Signed)
Left message for patient to call back  

## 2021-10-17 NOTE — Telephone Encounter (Signed)
Called primary number on file and went to voicemail. Did leave VM asking pt to contact office. Tried number listed on DPR for spouse who picked up and pt was with her in the car. Pt verified that he took 1 NTG as told to Nada Boozer, but that the chest pain subsided and he opted to not go to the ED. Pt states that CP hasn't occurred since. Informed pt to call us back if he needs anything.

## 2021-10-19 ENCOUNTER — Encounter: Payer: Self-pay | Admitting: Endocrinology

## 2021-10-19 ENCOUNTER — Other Ambulatory Visit (HOSPITAL_BASED_OUTPATIENT_CLINIC_OR_DEPARTMENT_OTHER): Payer: Self-pay

## 2021-10-19 ENCOUNTER — Telehealth: Payer: Self-pay | Admitting: *Deleted

## 2021-10-19 ENCOUNTER — Telehealth: Payer: Self-pay

## 2021-10-19 ENCOUNTER — Other Ambulatory Visit: Payer: Self-pay | Admitting: Endocrinology

## 2021-10-19 MED ORDER — INSULIN LISPRO 100 UNIT/ML IJ SOLN
INTRAMUSCULAR | 3 refills | Status: DC
  Filled 2021-10-19: qty 50, 83d supply, fill #0
  Filled 2022-01-12: qty 50, 83d supply, fill #1
  Filled 2022-04-02: qty 50, 83d supply, fill #2
  Filled 2022-06-23: qty 50, 83d supply, fill #3
  Filled 2022-09-13: qty 50, 83d supply, fill #4

## 2021-10-19 NOTE — Telephone Encounter (Signed)
° °  Name: Brandon [name]  DOB: 1956/02/08  MRN: [id number]  Primary Cardiologist: Meriam Sprague, MD  Chart reviewed as part of pre-operative protocol coverage. Because of Brandon [name]'s past medical history and time since last visit, he will require a follow-up visit in order to better assess preoperative cardiovascular risk. Per phone notes, patient reported an episode of chest pain on 10/15/2021, relieved with nitroglycerin.    Surgical clearance request received for lumbar facet block, date TBD, with request to hold Plavix x 7 days prior to the procedure. Recommend follow-up appointment prior to surgical clearance.   Pre-op covering staff: - Please schedule appointment and call patient to inform them. If patient already had an upcoming appointment within acceptable timeframe, please add "pre-op clearance" to the appointment notes so provider is aware. - Please contact requesting surgeon's office via preferred method (i.e, phone, fax) to inform them of need for appointment prior to surgery.  If applicable, this message will also be routed to pharmacy pool and/or primary cardiologist for input on holding anticoagulant/antiplatelet agent as requested below so that this information is available to the clearing provider at time of patient's appointment.   Joylene Grapes, NP  10/19/2021, 1:14 PM

## 2021-10-19 NOTE — Telephone Encounter (Signed)
° °  Pre-operative Risk Assessment    Patient Name: Brandon [name]  DOB: 1956-03-24 MRN: [id number]      Request for Surgical Clearance    Procedure:   LUMBAR FACET BLOCK  Date of Surgery:  Clearance TBD                                 Surgeon:  DR. BILAL LATEEF Surgeon's Group or Practice Name:  [facility]  Phone number:  [phone] Fax number:  [phone]   Type of Clearance Requested:   - Medical  - Pharmacy:  Hold Clopidogrel (Plavix) x 7 DAYS PRIOR TO PROCEDURE   Type of Anesthesia:   IV VERSED   Additional requests/questions:    [name]   10/19/2021, 11:46 AM

## 2021-10-19 NOTE — Telephone Encounter (Signed)
I s/w the pt and we discussed about a sooner appt due to recent chest pain, see notes in chart. Pt states he thinks it really was just anxiety and he feels much better. I d/w the pt that the pre op provider today wanted him to be seen sooner. Pt states he does not  need to be seen sooner and would prefer to keep his appt with Dr. Shari Prows 02/15/22 as he would like to d/w MD the upcoming procedure. Pt hs been made advised that if his chest pain returns or is worse he is to go to the ED. Pt is agreeable to plan of care. Pt states his procedure is not until July 2023. Pt thanked me for the call and the help and appreciates our office looking out for his health. I will update Dr. Shari Prows as Lorain Childes pt will need clearance when she see's him 02/15/22. Will send FYI to requesting office.

## 2021-10-19 NOTE — Telephone Encounter (Signed)
Pt received pump today and would like for you to give him a call to set up an appt to get his pump put on.  Thank you,  Leamon Arnt

## 2021-10-22 ENCOUNTER — Other Ambulatory Visit: Payer: Self-pay

## 2021-10-22 ENCOUNTER — Emergency Department (HOSPITAL_BASED_OUTPATIENT_CLINIC_OR_DEPARTMENT_OTHER): Payer: No Typology Code available for payment source

## 2021-10-22 ENCOUNTER — Telehealth: Payer: Self-pay

## 2021-10-22 ENCOUNTER — Emergency Department (HOSPITAL_BASED_OUTPATIENT_CLINIC_OR_DEPARTMENT_OTHER)
Admission: EM | Admit: 2021-10-22 | Discharge: 2021-10-22 | Disposition: A | Discharge status: Home / Self Care | Payer: No Typology Code available for payment source | Attending: Emergency Medicine | Admitting: Emergency Medicine

## 2021-10-22 ENCOUNTER — Encounter (HOSPITAL_BASED_OUTPATIENT_CLINIC_OR_DEPARTMENT_OTHER): Payer: Self-pay

## 2021-10-22 ENCOUNTER — Other Ambulatory Visit (HOSPITAL_BASED_OUTPATIENT_CLINIC_OR_DEPARTMENT_OTHER): Payer: Self-pay

## 2021-10-22 DIAGNOSIS — Z7984 Long term (current) use of oral hypoglycemic drugs: Secondary | ICD-10-CM | POA: Insufficient documentation

## 2021-10-22 DIAGNOSIS — R0981 Nasal congestion: Secondary | ICD-10-CM | POA: Insufficient documentation

## 2021-10-22 DIAGNOSIS — I10 Essential (primary) hypertension: Secondary | ICD-10-CM | POA: Insufficient documentation

## 2021-10-22 DIAGNOSIS — J029 Acute pharyngitis, unspecified: Secondary | ICD-10-CM | POA: Insufficient documentation

## 2021-10-22 DIAGNOSIS — Z91040 Latex allergy status: Secondary | ICD-10-CM | POA: Diagnosis not present

## 2021-10-22 DIAGNOSIS — R5381 Other malaise: Secondary | ICD-10-CM | POA: Diagnosis not present

## 2021-10-22 DIAGNOSIS — Z7982 Long term (current) use of aspirin: Secondary | ICD-10-CM | POA: Diagnosis not present

## 2021-10-22 DIAGNOSIS — E119 Type 2 diabetes mellitus without complications: Secondary | ICD-10-CM | POA: Insufficient documentation

## 2021-10-22 DIAGNOSIS — R410 Disorientation, unspecified: Secondary | ICD-10-CM | POA: Insufficient documentation

## 2021-10-22 DIAGNOSIS — Z20822 Contact with and (suspected) exposure to covid-19: Secondary | ICD-10-CM | POA: Diagnosis not present

## 2021-10-22 DIAGNOSIS — Z794 Long term (current) use of insulin: Secondary | ICD-10-CM | POA: Insufficient documentation

## 2021-10-22 DIAGNOSIS — Z79899 Other long term (current) drug therapy: Secondary | ICD-10-CM | POA: Diagnosis not present

## 2021-10-22 DIAGNOSIS — I2510 Atherosclerotic heart disease of native coronary artery without angina pectoris: Secondary | ICD-10-CM | POA: Insufficient documentation

## 2021-10-22 DIAGNOSIS — R5383 Other fatigue: Secondary | ICD-10-CM | POA: Diagnosis present

## 2021-10-22 LAB — COMPREHENSIVE METABOLIC PANEL
ALT: 15 U/L (ref 0–44)
AST: 27 U/L (ref 15–41)
Albumin: 4 g/dL (ref 3.5–5.0)
Alkaline Phosphatase: 69 U/L (ref 38–126)
Anion gap: 6 (ref 5–15)
BUN: 16 mg/dL (ref 8–23)
CO2: 28 mmol/L (ref 22–32)
Calcium: 9 mg/dL (ref 8.9–10.3)
Chloride: 103 mmol/L (ref 98–111)
Creatinine, Ser: 0.91 mg/dL (ref 0.61–1.24)
GFR, Estimated: >60 mL/min (ref >60)
Glucose, Bld: 136 mg/dL — ABNORMAL HIGH (ref 70–99)
Potassium: 3.9 mmol/L (ref 3.5–5.1)
Sodium: 137 mmol/L (ref 135–145)
Total Bilirubin: 0.8 mg/dL (ref 0.3–1.2)
Total Protein: 7.5 g/dL (ref 6.5–8.1)

## 2021-10-22 LAB — CBC WITH DIFFERENTIAL/PLATELET
Abs Immature Granulocytes: 0.01 10*3/uL (ref 0.00–0.07)
Basophils Absolute: 0 10*3/uL (ref 0.0–0.1)
Basophils Relative: 0 %
Eosinophils Absolute: 0.1 10*3/uL (ref 0.0–0.5)
Eosinophils Relative: 1 %
HCT: 44.9 % (ref 39.0–52.0)
Hemoglobin: 14.2 g/dL (ref 13.0–17.0)
Immature Granulocytes: 0 %
Lymphocytes Relative: 24 %
Lymphs Abs: 1.7 10*3/uL (ref 0.7–4.0)
MCH: 26.6 pg (ref 26.0–34.0)
MCHC: 31.6 g/dL (ref 30.0–36.0)
MCV: 84.1 fL (ref 80.0–100.0)
Monocytes Absolute: 0.8 10*3/uL (ref 0.1–1.0)
Monocytes Relative: 12 %
Neutro Abs: 4.5 10*3/uL (ref 1.7–7.7)
Neutrophils Relative %: 63 %
Platelets: 228 10*3/uL (ref 150–400)
RBC: 5.34 MIL/uL (ref 4.22–5.81)
RDW: 20.1 % — ABNORMAL HIGH (ref 11.5–15.5)
WBC: 7.1 10*3/uL (ref 4.0–10.5)
nRBC: 0 % (ref 0.0–0.2)

## 2021-10-22 LAB — RESP PANEL BY RT-PCR (FLU A&B, COVID) ARPGX2
Influenza A by PCR: NEGATIVE
Influenza B by PCR: NEGATIVE
SARS Coronavirus 2 by RT PCR: NEGATIVE

## 2021-10-22 LAB — URINALYSIS, ROUTINE W REFLEX MICROSCOPIC
Bilirubin Urine: NEGATIVE
Glucose, UA: >=500 mg/dL — AB
Hgb urine dipstick: NEGATIVE
Ketones, ur: NEGATIVE mg/dL
Leukocytes,Ua: NEGATIVE
Nitrite: NEGATIVE
Protein, ur: NEGATIVE mg/dL
Specific Gravity, Urine: 1.02 (ref 1.005–1.030)
pH: 6 (ref 5.0–8.0)

## 2021-10-22 LAB — TROPONIN I (HIGH SENSITIVITY)
Troponin I (High Sensitivity): 4 ng/L (ref <18)
Troponin I (High Sensitivity): 4 ng/L (ref <18)

## 2021-10-22 LAB — CK: Total CK: 102 U/L (ref 49–397)

## 2021-10-22 LAB — URINALYSIS, MICROSCOPIC (REFLEX)

## 2021-10-22 LAB — D-DIMER, QUANTITATIVE: D-Dimer, Quant: 0.29 ug/mL-FEU (ref 0.00–0.50)

## 2021-10-22 MED ORDER — LACTATED RINGERS IV BOLUS
1000.0000 mL | Freq: Once | INTRAVENOUS | Status: AC
  Administered 2021-10-22: 1000 mL via INTRAVENOUS

## 2021-10-22 NOTE — Discharge Instructions (Addendum)
Future Appointments  Date Time Provider Department Center  10/23/2021 11:30 AM Everrett Coombe, DO [facility] None  10/30/2021 10:00 AM Jessica Priest, RN [facility]  11/01/2021 10:40 AM Quintella Reichert, MD [facility]  12/10/2021 11:00 AM Romero Belling, MD [facility] None  02/05/2022 11:40 AM Edward Jolly, MD [facility] None  02/15/2022  9:40 AM Meriam Sprague, MD [facility]   Go to your primary care office tomorrow for appointment as previously scheduled.  Come back to ER if you develop chest pain, difficulty breathing, vomiting, fever, any episodes of passing out or other new concerning symptom.  I additionally recommend following up with your cardiologist to discuss your episode of chest pain that you had last week.  Please call their office to request sooner appointment than your currently scheduled June appointment.  Ideally to be seen within the next week or so.

## 2021-10-22 NOTE — Telephone Encounter (Signed)
Agree with evaluation in ED today.

## 2021-10-22 NOTE — ED Triage Notes (Signed)
Pt c/o fatigue, confused, dizziness when he stands-sx started 2/19-states he had CP last week-relieved with NTG x 1-none at present-NAD- to triage in w/c

## 2021-10-22 NOTE — ED Provider Notes (Signed)
[facility] EMERGENCY DEPARTMENT Provider Note   CSN: [id number] Arrival date & time: 10/22/21  1504     History  Chief Complaint  Patient presents with   Fatigue    Brandon [name] is a 66 y.o. male.  Presented to the emergency room with concern for fatigue, confusion, malaise.  Patient states that last week Thursday she had an episode of chest pain, was moderate in severity, lasted a couple hours and resolved when he took a dose of nitroglycerin.  Since that time he has not had any additional episodes of chest pain or chest discomfort.  Over the last few days he has had increasing nasal congestion, sore throat, general malaise and fatigue.  Today he had some times where he felt somewhat confused, foggy headed.  No difficulty with speech, no slurred speech, no vision change, no numbness or weakness in his arms or legs.  Answering questions appropriately without difficulty.  Additional history obtained from wife at bedside, states patient has appointment tomorrow with primary care doctor but they advised coming to ER today to get checked out due to his medical comorbidities.  Wife also reports that she checked blood pressure at home and it was in 80s systolic.  Reviewed last cardiology note, patient has extensive history of CAD, s/p DES in 2021 and 2022.  Also has history of hypertension, hyperlipidemia, diabetes, chronic pain.  HPI     Home Medications Prior to Admission medications   Medication Sig Start Date End Date Taking? Authorizing Provider  amLODipine (NORVASC) 10 MG tablet Take 1/2 tablet (5 mg total) by mouth daily. 07/17/21   Meriam Sprague, MD  ARIPiprazole (ABILIFY) 10 MG tablet Take 10 mg by mouth every morning.    [provider]  aspirin 81 MG EC tablet Take 81 mg by mouth every morning.    [provider]  atorvastatin (LIPITOR) 80 MG tablet TAKE 1 TABLET (80 MG TOTAL) BY MOUTH DAILY. 07/17/21 07/17/22  Meriam Sprague, MD  b  complex vitamins capsule Take 1 capsule by mouth daily.    [provider]  buprenorphine (BUTRANS) 20 MCG/HR PTWK Place 1 patch onto the skin once a week. 10/11/21 01/31/22  Edward Jolly, MD  buPROPion (WELLBUTRIN XL) 150 MG 24 hr tablet Take 1 tablet (150 mg total) by mouth daily. 08/21/21   Everrett Coombe, DO  buPROPion (WELLBUTRIN XL) 300 MG 24 hr tablet Take 1 tablet (300 mg total) by mouth daily. 08/21/21   Everrett Coombe, DO  clopidogrel (PLAVIX) 75 MG tablet TAKE 1 TABLET (75 MG TOTAL) BY MOUTH DAILY WITH BREAKFAST. 07/17/21 07/17/22  Meriam Sprague, MD  Continuous Blood Gluc Sensor (DEXCOM G6 SENSOR) MISC Use as directed. Change sensor every 10 days 11/25/20   Romero Belling, MD  empagliflozin (JARDIANCE) 25 MG TABS tablet Take 1 tablet (25 mg total) by mouth daily. 08/01/21   Everrett Coombe, DO  glucose blood test strip USE 1 TO CHECK BLOOD GLUCOSE UP TO 5 TIMES DAILY. Patient taking differently: USE 1 TO CHECK BLOOD GLUCOSE UP TO 5 TIMES DAILY. 10/06/20 11/18/21  Everrett Coombe, DO  insulin lispro (HUMALOG) 100 UNIT/ML injection For use in pump, total of 60 units per day 10/19/21   Romero Belling, MD  isosorbide mononitrate (IMDUR) 30 MG 24 hr tablet Take 1 tablet (30 mg total) by mouth daily. 07/17/21   Meriam Sprague, MD  lidocaine (LIDODERM) 5 % Apply 1 - 3 patches on to the skin as directed. 12  hours on and 12 hours off. 08/10/21     losartan (COZAAR) 25 MG tablet Take 1 tablet (25 mg total) by mouth daily. Take only when systolic BP greater than [id number]. 12/04/20   Pemberton, Greer Ee, MD  metoCLOPramide (REGLAN) 10 MG tablet Take 10 mg by mouth daily as needed for nausea or vomiting (upset stomach).    [provider]  metoprolol succinate (TOPROL-XL) 50 MG 24 hr tablet Take 1 tablet (50 mg total) by mouth daily. Take with or immediately following a meal. 07/17/21   Pemberton, Greer Ee, MD  nitroGLYCERIN (NITROSTAT) 0.4 MG SL tablet Place 1 tablet (0.4 mg total) under  the tongue every 5 (five) minutes as needed for chest pain. 09/26/21 10/26/21  Luetta Nutting, DO  nystatin (MYCOSTATIN) 100000 UNIT/ML suspension Use as directed 5 mLs (500,000 Units total) in the mouth or throat 4 (four) times daily for 7 days. Patient not taking: Reported on 10/02/2021 05/21/21   Luetta Nutting, DO  omega-3 acid ethyl esters (LOVAZA) 1 g capsule Take by mouth daily.    [provider]  ondansetron (ZOFRAN-ODT) 4 MG disintegrating tablet Take 1 tablet (4 mg total) by mouth every 8 (eight) hours as needed for nausea or vomiting. 09/26/21   Luetta Nutting, DO  pantoprazole (PROTONIX) 40 MG tablet Take 1 tablet (40 mg total) by mouth daily. 08/29/21   Luetta Nutting, DO  prednisoLONE-lidocaine-nystatin-diphenhydrAMINE-alum & mag hydroxide-simeth Swish and spit 106mls 3 times daily as needed for mouth pain. Patient not taking: Reported on 10/02/2021 03/19/21   Luetta Nutting, DO  Semaglutide, 2 MG/DOSE, (OZEMPIC, 2 MG/DOSE,) 8 MG/3ML SOPN Inject 2 mg into the skin once a week. 05/29/21   Renato Shin, MD  tamsulosin (FLOMAX) 0.4 MG CAPS capsule Take 1 capsule (0.4 mg total) by mouth daily. 06/15/21   Luetta Nutting, DO  testosterone (ANDROGEL) 50 MG/5GM (1%) GEL Place 5 g (1 packet) onto the skin daily. Apply to shoulders and upper arms 07/25/21 10/24/21  Luetta Nutting, DO  tiZANidine (ZANAFLEX) 4 MG tablet Take 2 tablets (8 mg total) by mouth 3 (three) times daily. 12/26/20   Silverio Decamp, MD  traMADol (ULTRAM) 50 MG tablet Take 2 tablets (100 mg total) by mouth 3 (three) times daily as needed. Patient not taking: Reported on 10/11/2021 06/01/21 11/28/21  Silverio Decamp, MD  traZODone (DESYREL) 50 MG tablet TAKE 1-2 TABLETS (50-100 MG TOTAL) BY MOUTH AT BEDTIME AS NEEDED FOR SLEEP. 06/01/21 06/01/22  Luetta Nutting, DO  triazolam (HALCION) 0.25 MG tablet TAKE 1-2 TABS BY MOUTH 2 HOURS BEFORE PROCEDURE OR IMAGING.DO NOT DRIVE WITH THIS MEDICATION. 11/28/20 05/27/21  Silverio Decamp, MD      Allergies    Kiwi extract, Other, Penicillins, Peach [prunus persica], Ancef [cefazolin], Latex, and Levaquin [levofloxacin]    Review of Systems   Review of Systems  Constitutional:  Positive for fatigue. Negative for chills and fever.  HENT:  Negative for ear pain and sore throat.   Eyes:  Negative for pain and visual disturbance.  Respiratory:  Negative for cough and shortness of breath.   Cardiovascular:  Positive for chest pain. Negative for palpitations.  Gastrointestinal:  Negative for abdominal pain, nausea and vomiting.  Genitourinary:  Negative for dysuria and hematuria.  Musculoskeletal:  Positive for myalgias. Negative for arthralgias and back pain.  Skin:  Negative for color change and rash.  Neurological:  Negative for seizures and syncope.  All other systems reviewed and are negative.  Physical Exam  Updated Vital Signs BP (!) 121/59 (BP Location: Right Arm)    Pulse (!) 57    Temp 97.9 F (36.6 C) (Oral)    Resp 13    Ht 5\' 9"  (1.753 m)    Wt 88.5 kg    SpO2 95%    BMI 28.80 kg/m  Physical Exam  ED Results / Procedures / Treatments   Labs (all labs ordered are listed, but only abnormal results are displayed) Labs Reviewed  CBC WITH DIFFERENTIAL/PLATELET - Abnormal; Notable for the following components:      Result Value   RDW 20.1 (*)    All other components within normal limits  COMPREHENSIVE METABOLIC PANEL - Abnormal; Notable for the following components:   Glucose, Bld 136 (*)    All other components within normal limits  URINALYSIS, ROUTINE W REFLEX MICROSCOPIC - Abnormal; Notable for the following components:   Glucose, UA >=500 (*)    All other components within normal limits  URINALYSIS, MICROSCOPIC (REFLEX) - Abnormal; Notable for the following components:   Bacteria, UA RARE (*)    All other components within normal limits  RESP PANEL BY RT-PCR (FLU A&B, COVID) ARPGX2  CK  D-DIMER, QUANTITATIVE  TROPONIN I (HIGH SENSITIVITY)   TROPONIN I (HIGH SENSITIVITY)    EKG EKG Interpretation  Date/Time:  Monday October 22 2021 15:17:50 EST Ventricular Rate:  76 PR Interval:  168 QRS Duration: 92 QT Interval:  392 QTC Calculation: 441 R Axis:   32 Text Interpretation: Normal sinus rhythm Normal ECG When compared with ECG of 18-Jan-2021 04:40, No acute changes Confirmed by Madalyn Rob [phone]) on 10/22/2021 4:50:19 PM  Radiology DG Chest 2 View  Result Date: 10/22/2021 CLINICAL DATA:  Chest pain EXAM: CHEST - 2 VIEW COMPARISON:  08/11/2020 FINDINGS: The heart size and mediastinal contours are within normal limits. Both lungs are clear. The visualized skeletal structures are unremarkable. There is pain control lead in thoracic spinal canal. IMPRESSION: No active cardiopulmonary disease. Electronically Signed   By: Elmer Picker M.D.   On: 10/22/2021 16:57    Procedures Procedures    Medications Ordered in ED Medications  lactated ringers bolus 1,000 mL (0 mLs Intravenous Stopped 10/22/21 1752)    ED Course/ Medical Decision Making/ A&P                           Medical Decision Making Amount and/or Complexity of Data Reviewed Labs: ordered. Radiology: ordered.   66 y/o male with hx of CAD, s/p DES in 2021 and 2022.  Also has history of hypertension, hyperlipidemia, diabetes, chronic pain.  Presented to the emergency department with concern for malaise, fatigue, episode of chest pain among other symptoms.  On physical exam he appears well in no acute distress.  His initial BP in triage was 90 systolic but without intervention it was 109/69.  Remainder vitals within normal limits.  Patient is alert and oriented, no difficulty with questioning, no neurologic deficit appreciated.  Obtain broad work-up including EKG, troponin, no acute findings on EKG and troponin within normal limits, doubt ACS.  D-dimer within normal limits, doubt pulmonary embolism.  No anemia, no electrolyte derangement.  CXR without  infiltrate.  Based on constellation of symptoms, suspect most likely viral illness.  COVID and flu negative.  Given reassuring work-up, no further episodes of low BP, feel he can be discharged and managed in the outpatient setting at this time.  Has follow-up with PCP tomorrow.  Stressed importance of seeing primary doctor tomorrow and returning to ER if he has any worsening of his condition.  Wife updated throughout stay.  Independently reviewed CXR. Additional history obtained from chart review, review of past cardiology note, PCP notes.         Final Clinical Impression(s) / ED Diagnoses Final diagnoses:  Fatigue, unspecified type    Rx / DC Orders ED Discharge Orders     None         Lucrezia Starch, MD 10/22/21 2210

## 2021-10-22 NOTE — Telephone Encounter (Signed)
Brandon [name]'s wife called and states he is not himself. His blood pressure is low 83/59. He is sleeping all the time. He is not acting himself. His blood sugar is around 170 mg/dl. He does have a cold/sinus drainage and because of the congestion he has not been using his CPAP. She is very concerned. I advised to go to the ED today. He does have an appointment tomorrow with Dr Ashley Royalty.

## 2021-10-23 ENCOUNTER — Encounter: Payer: Self-pay | Admitting: Family Medicine

## 2021-10-23 ENCOUNTER — Ambulatory Visit (INDEPENDENT_AMBULATORY_CARE_PROVIDER_SITE_OTHER): Payer: No Typology Code available for payment source | Admitting: Family Medicine

## 2021-10-23 VITALS — BP 110/66 | HR 74 | Temp 98.3°F | Ht 69.0 in | Wt 204.0 lb

## 2021-10-23 DIAGNOSIS — R718 Other abnormality of red blood cells: Secondary | ICD-10-CM | POA: Diagnosis not present

## 2021-10-23 DIAGNOSIS — I10 Essential (primary) hypertension: Secondary | ICD-10-CM | POA: Diagnosis not present

## 2021-10-23 NOTE — Progress Notes (Signed)
MARCELLE [name] - 66 y.o. male MRN [id number]  Date of birth: [birth year]  Subjective Chief Complaint  Patient presents with   Follow-up    HPI Brandon [name] is a 66 year old male here today for hospital follow-up.  He was seen yesterday in the ED due to hypotension and fatigue.  Blood pressure at home was 83/59.  Wife reported he was sleeping all the time over the past couple of days..  He has had mild cold symptoms recently but denies fever, chills or significant respiratory symptoms.  He denies chest pain or dyspnea.  D-dimer and cardiac enzymes were normal in the ED yesterday.  He does report that he feels much better today.  ROS:  A comprehensive ROS was completed and negative except as noted per HPI  Allergies  Allergen Reactions   Kiwi Extract Swelling    Mouth swelling.    Other Swelling and Other (See Comments)    EGGPLANT. Mouth swelling.   Penicillins Rash    Reaction: unknown   Peach [Prunus Persica] Other (See Comments)    Burns mouth   Ancef [Cefazolin] Rash   Latex Rash   Levaquin [Levofloxacin] Rash    Past Medical History:  Diagnosis Date   Anxiety    Basal cell carcinoma (BCC) in situ of skin    Colon polyps    Coronary artery disease    S/p DES to mRCA in 12/21 // S/p DES to LCx x 2 in 12/2020 // Diff dz in small to mod Dx (not a good target for PCI); OM1 100 CTO; severe dz in small OM2 and PDA >> Med Rx   Depression    Diabetes mellitus without complication (HCC)    HLD (hyperlipidemia)    Hypertension    SBO (small bowel obstruction) (HCC)     Past Surgical History:  Procedure Laterality Date   BACK SURGERY     CARDIAC CATHETERIZATION     CATARACT EXTRACTION W/ INTRAOCULAR LENS  IMPLANT, BILATERAL     CHOLECYSTECTOMY     CHOLECYSTECTOMY, LAPAROSCOPIC     CORONARY STENT INTERVENTION N/A 08/15/2020   Procedure: CORONARY STENT INTERVENTION;  Surgeon: Corky Crafts, MD;  Location: [facility] INVASIVE CV LAB;  Service: Cardiovascular;  Laterality: N/A;    CORONARY STENT INTERVENTION N/A 01/17/2021   Procedure: CORONARY STENT INTERVENTION;  Surgeon: Kathleene Hazel, MD;  Location: [facility] INVASIVE CV LAB;  Service: Cardiovascular;  Laterality: N/A;   EYE SURGERY     INTRAVASCULAR ULTRASOUND/IVUS N/A 08/15/2020   Procedure: Intravascular Ultrasound/IVUS;  Surgeon: Corky Crafts, MD;  Location: [facility] INVASIVE CV LAB;  Service: Cardiovascular;  Laterality: N/A;   LEFT HEART CATH AND CORONARY ANGIOGRAPHY N/A 08/15/2020   Procedure: LEFT HEART CATH AND CORONARY ANGIOGRAPHY;  Surgeon: Corky Crafts, MD;  Location: [facility] INVASIVE CV LAB;  Service: Cardiovascular;  Laterality: N/A;   LEFT HEART CATH AND CORONARY ANGIOGRAPHY N/A 01/17/2021   Procedure: LEFT HEART CATH AND CORONARY ANGIOGRAPHY;  Surgeon: Kathleene Hazel, MD;  Location: [facility] INVASIVE CV LAB;  Service: Cardiovascular;  Laterality: N/A;   LUMBAR FUSION     L3-5 with rods   XI ROBOTIC ASSISTED INGUINAL HERNIA REPAIR WITH MESH     x 3 surgeries    Social History   Socioeconomic History   Marital status: Married    Spouse name: Not on file   Number of children: 4   Years of education: 28   Highest education level: Not on file  Occupational History   Occupation:  disabled  Tobacco Use   Smoking status: Former    Types: Cigarettes    Quit date: 2007    Years since quitting: 16.1   Smokeless tobacco: Never  Vaping Use   Vaping Use: Never used  Substance and Sexual Activity   Alcohol use: Not Currently   Drug use: Not Currently   Sexual activity: Yes  Other Topics Concern   Not on file  Social History Narrative   Not on file   Social Determinants of Health   Financial Resource Strain: Not on file  Food Insecurity: Not on file  Transportation Needs: Not on file  Physical Activity: Not on file  Stress: Not on file  Social Connections: Not on file    Family History  Problem Relation Age of Onset   COPD Mother    Anxiety disorder Mother    Depression  Mother    Cancer Father    Anxiety disorder Father    Depression Father    Other Father    Diabetes Neg Hx     Health Maintenance  Topic Date Due   Hepatitis C Screening  Never done   TETANUS/TDAP  Never done   COLONOSCOPY (Pts 45-45yrs Insurance coverage will need to be confirmed)  Never done   Zoster Vaccines- Shingrix (1 of 2) Never done   COVID-19 Vaccine (3 - Booster for Pfizer series) 08/11/2020   HEMOGLOBIN A1C  03/04/2022   OPHTHALMOLOGY EXAM  04/24/2022   FOOT EXAM  05/29/2022   Pneumonia Vaccine 63+ Years old  Completed   INFLUENZA VACCINE  Completed   HPV VACCINES  Aged Out     ----------------------------------------------------------------------------------------------------------------------------------------------------------------------------------------------------------------- Physical Exam BP 110/66    Pulse 74    Temp 98.3 F (36.8 C)    Ht 5\' 9"  (1.753 m)    Wt 204 lb (92.5 kg)    SpO2 96%    BMI 30.13 kg/m   Physical Exam Constitutional:      Appearance: Normal appearance.  Eyes:     General: No scleral icterus. Cardiovascular:     Rate and Rhythm: Normal rate and regular rhythm.  Pulmonary:     Effort: Pulmonary effort is normal.     Breath sounds: Normal breath sounds.  Musculoskeletal:     Cervical back: Neck supple.  Neurological:     Mental Status: He is alert.  Psychiatric:        Mood and Affect: Mood normal.        Behavior: Behavior normal.    ------------------------------------------------------------------------------------------------------------------------------------------------------------------------------------------------------------------- Assessment and Plan  Primary hypertension History of hypertension now with hypotensive symptoms.  History of significant weight loss related to diet and lifestyle change.  We will have him discontinue amlodipine.  Increase fluids recommended.   No orders of the defined types were  placed in this encounter.   Return in about 6 weeks (around 12/04/2021) for BP.    This visit occurred during the SARS-CoV-2 public health emergency.  Safety protocols were in place, including screening questions prior to the visit, additional usage of staff PPE, and extensive cleaning of exam room while observing appropriate contact time as indicated for disinfecting solutions.

## 2021-10-23 NOTE — Assessment & Plan Note (Addendum)
History of hypertension now with hypotensive symptoms.  History of significant weight loss related to diet and lifestyle change.  We will have him discontinue amlodipine.  Increase fluids recommended.

## 2021-10-23 NOTE — Patient Instructions (Addendum)
Stop amlodipine.  Continue to monitor BP at home.  Follow up in 6 weeks.

## 2021-10-24 ENCOUNTER — Ambulatory Visit (INDEPENDENT_AMBULATORY_CARE_PROVIDER_SITE_OTHER): Payer: No Typology Code available for payment source | Admitting: Cardiology

## 2021-10-24 ENCOUNTER — Other Ambulatory Visit: Payer: Self-pay

## 2021-10-24 ENCOUNTER — Other Ambulatory Visit (HOSPITAL_BASED_OUTPATIENT_CLINIC_OR_DEPARTMENT_OTHER): Payer: Self-pay

## 2021-10-24 ENCOUNTER — Encounter: Payer: Self-pay | Admitting: Cardiology

## 2021-10-24 VITALS — BP 116/60 | HR 74 | Ht 69.0 in | Wt 203.8 lb

## 2021-10-24 DIAGNOSIS — I208 Other forms of angina pectoris: Secondary | ICD-10-CM | POA: Diagnosis not present

## 2021-10-24 DIAGNOSIS — I10 Essential (primary) hypertension: Secondary | ICD-10-CM

## 2021-10-24 DIAGNOSIS — E782 Mixed hyperlipidemia: Secondary | ICD-10-CM

## 2021-10-24 DIAGNOSIS — E119 Type 2 diabetes mellitus without complications: Secondary | ICD-10-CM

## 2021-10-24 DIAGNOSIS — G4733 Obstructive sleep apnea (adult) (pediatric): Secondary | ICD-10-CM | POA: Diagnosis not present

## 2021-10-24 DIAGNOSIS — I25118 Atherosclerotic heart disease of native coronary artery with other forms of angina pectoris: Secondary | ICD-10-CM | POA: Diagnosis not present

## 2021-10-24 DIAGNOSIS — E669 Obesity, unspecified: Secondary | ICD-10-CM

## 2021-10-24 DIAGNOSIS — E611 Iron deficiency: Secondary | ICD-10-CM

## 2021-10-24 DIAGNOSIS — I2089 Other forms of angina pectoris: Secondary | ICD-10-CM

## 2021-10-24 LAB — IRON,TIBC AND FERRITIN PANEL
%SAT: 15 % (calc) — ABNORMAL LOW (ref 20–48)
Ferritin: 9 ng/mL — ABNORMAL LOW (ref 24–380)
Iron: 60 ug/dL (ref 50–180)
TIBC: 403 mcg/dL (calc) (ref 250–425)

## 2021-10-24 MED ORDER — IRON (FERROUS SULFATE) 325 (65 FE) MG PO TABS
325.0000 mg | ORAL_TABLET | Freq: Every day | ORAL | 1 refills | Status: DC
  Filled 2021-10-24: qty 100, 100d supply, fill #0
  Filled 2022-02-01: qty 100, 100d supply, fill #1

## 2021-10-24 MED ORDER — METOPROLOL SUCCINATE ER 25 MG PO TB24
25.0000 mg | ORAL_TABLET | Freq: Every day | ORAL | 2 refills | Status: DC
  Filled 2021-10-24: qty 90, 90d supply, fill #0
  Filled 2022-02-01: qty 90, 90d supply, fill #1
  Filled 2022-04-28: qty 90, 90d supply, fill #2

## 2021-10-24 NOTE — Progress Notes (Signed)
Cardiology Office Note:    Date:  10/24/2021   ID:  Brandon [name], DOB 01-17-1956, MRN [id number]  PCP:  Brandon Coombe, DO   [facility]  Cardiologist:  Brandon Sprague, MD  Advanced Practice Provider:  No care team member to display Electrophysiologist:  None   :[id number]}   Referring MD: Brandon Coombe, DO     History of Present Illness:    Brandon [name] is a 66 y.o. male with a hx of HTN, HLD, DMII, and CAD s/p recent PCI to RCA who presents to clinic for follow-up.  Patient was admitted to the hospital on 08/11/2020 with chest pain.  CTA of the chest showed no evidence of aortic dissection or aneurysm.  Respiratory panel negative for influenza and Covid.  D-dimer negative.  Troponin negative x2.  TTE 08/24/2020 showed EF 60 to 65%, no significant valve issue.  Given the atypical nature of his symptoms, he underwent coronary CT on 08/14/2020 which showed coronary calcium score of 1201 which placed the patient in 95th percentile for age and sex matched control, diffuse CAD with beadlike diabetic pattern with suspicion for severe stenosis in the mid RCA, ostial D2 and proximal left circumflex artery.  He subsequently underwent cardiac catheterization on 08/15/2020 which showed 90% mid RCA lesion, 75% proximal left circumflex artery, 75% OM 2, 100% OM1 with faint left to left and right-to-left collaterals, 70% RPDA, 75% D2, 30% distal LAD lesion.  The 90% mid RCA lesion was treated successfully with a 4.0 x 38 mm resolute DES.  The small disease in RPDA, OM1, OM2 at the diagonal vessel was managed medically. Postprocedure, he continued to have chest discomfort.  A bedside echocardiogram obtained on 08/15/2020 did not reveal any pericardial effusion, there was no change in LV function.  He was treated with colchicine 0.6 mg twice a day, colchicine was subsequently discontinued due to lack of recurrent symptoms.  Hemoglobin A1c obtained during this admission was 10,  he has been advised to follow-up with his primary care provider.  During last visit on 12/04/20, the patient was having significant DOE in the setting of known significant Lcx disease.He failed antianginal medication up-titration and therefore went for cath on 01/17/21 which revealed severe prox Lcx stenosis, severe mid Lcx stenosis and chronic occlusion of OM1. Proximal RCA stent was patent. Severe disease in PDA (small caliber) unchanged from last cath. He received PCI with DES to prox Lcx and mid Lcx.   Was last seen in clinic Brandon [name], [place] on 02/09/21 where he was feeling better with improved exercise tolerance.No further chest pain.   He presented to the ED 10/22/21 for fatigue, confusion and malaise in the setting of low blood pressures. Work-up there with nonischemic ECG, normal trop, normal d-dimer, reassuring BNP. CXR without infiltrate. He clinically improved and was discharged home.   He followed-up with Dr. Ashley [name] the next day. He was doing well and advised to increase fluids. Amlodipine was discontinued.   Today, the patient feels much better. No further episodes of lightheadedness. Had one episode of chest pain last week while at rest that resolved wit nitro. No exertional symptoms. He goes to the gym 2 to 3 times a week. He does a cardio workout like the elliptical for 30 minutes and goes to the pool and performs his therapy exercises for an additional 30 minutes. He feels well while doing this. No LE edema, orthopnea, palpitations or PND.  He started seeing a new pain clinic  with Dr. Cherylann [name] in [facility]. Dr. Cherylann [name] wants to try spinal injections that would require him to come off of Plavix. He started a Butrans patch which has been helping his back pain.   The patient denies dyspnea at rest or with exertion, palpitations, PND, orthopnea, or leg swelling. Denies cough, fever, chills. Denies nausea, vomiting. Denies syncope or presyncope. Denies dizziness or lightheadedness.    Filed Weights   10/24/21 1555  Weight: 203 lb 12.8 oz (92.4 kg)    Past Medical History:  Diagnosis Date   Anxiety    Basal cell carcinoma (BCC) in situ of skin    Colon polyps    Coronary artery disease    S/p DES to mRCA in 12/21 // S/p DES to LCx x 2 in 12/2020 // Diff dz in small to mod Dx (not a good target for PCI); OM1 100 CTO; severe dz in small OM2 and PDA >> Med Rx   Depression    Diabetes mellitus without complication (HCC)    HLD (hyperlipidemia)    Hypertension    SBO (small bowel obstruction) (HCC)     Past Surgical History:  Procedure Laterality Date   BACK SURGERY     CARDIAC CATHETERIZATION     CATARACT EXTRACTION W/ INTRAOCULAR LENS  IMPLANT, BILATERAL     CHOLECYSTECTOMY     CHOLECYSTECTOMY, LAPAROSCOPIC     CORONARY STENT INTERVENTION N/A 08/15/2020   Procedure: CORONARY STENT INTERVENTION;  Surgeon: Corky Crafts, MD;  Location: [facility] INVASIVE CV LAB;  Service: Cardiovascular;  Laterality: N/A;   CORONARY STENT INTERVENTION N/A 01/17/2021   Procedure: CORONARY STENT INTERVENTION;  Surgeon: Kathleene Hazel, MD;  Location: [facility] INVASIVE CV LAB;  Service: Cardiovascular;  Laterality: N/A;   EYE SURGERY     INTRAVASCULAR ULTRASOUND/IVUS N/A 08/15/2020   Procedure: Intravascular Ultrasound/IVUS;  Surgeon: Corky Crafts, MD;  Location: [facility] INVASIVE CV LAB;  Service: Cardiovascular;  Laterality: N/A;   LEFT HEART CATH AND CORONARY ANGIOGRAPHY N/A 08/15/2020   Procedure: LEFT HEART CATH AND CORONARY ANGIOGRAPHY;  Surgeon: Corky Crafts, MD;  Location: [facility] INVASIVE CV LAB;  Service: Cardiovascular;  Laterality: N/A;   LEFT HEART CATH AND CORONARY ANGIOGRAPHY N/A 01/17/2021   Procedure: LEFT HEART CATH AND CORONARY ANGIOGRAPHY;  Surgeon: Kathleene Hazel, MD;  Location: [facility] INVASIVE CV LAB;  Service: Cardiovascular;  Laterality: N/A;   LUMBAR FUSION     L3-5 with rods   XI ROBOTIC ASSISTED INGUINAL HERNIA REPAIR WITH MESH     x 3  surgeries    Current Medications: Current Meds  Medication Sig   amLODipine (NORVASC) 10 MG tablet Take 1/2 tablet (5 mg total) by mouth daily.   ARIPiprazole (ABILIFY) 10 MG tablet Take 10 mg by mouth every morning.   aspirin 81 MG EC tablet Take 81 mg by mouth every morning.   atorvastatin (LIPITOR) 80 MG tablet TAKE 1 TABLET (80 MG TOTAL) BY MOUTH DAILY.   b complex vitamins capsule Take 1 capsule by mouth daily.   buprenorphine (BUTRANS) 20 MCG/HR PTWK Place 1 patch onto the skin once a week.   buPROPion (WELLBUTRIN XL) 150 MG 24 hr tablet Take 1 tablet (150 mg total) by mouth daily.   buPROPion (WELLBUTRIN XL) 300 MG 24 hr tablet Take 1 tablet (300 mg total) by mouth daily.   clopidogrel (PLAVIX) 75 MG tablet TAKE 1 TABLET (75 MG TOTAL) BY MOUTH DAILY WITH BREAKFAST.   Continuous Blood Gluc Sensor (DEXCOM G6 SENSOR) MISC  Use as directed. Change sensor every 10 days   empagliflozin (JARDIANCE) 25 MG TABS tablet Take 1 tablet (25 mg total) by mouth daily.   glucose blood test strip USE 1 TO CHECK BLOOD GLUCOSE UP TO 5 TIMES DAILY. (Patient taking differently: USE 1 TO CHECK BLOOD GLUCOSE UP TO 5 TIMES DAILY.)   Insulin Asp Prot & Asp FlexPen (NOVOLOG 70/30 MIX) (70-30) 100 UNIT/ML FlexPen See admin instructions.   insulin lispro (HUMALOG) 100 UNIT/ML injection For use in pump, total of 60 units per day   isosorbide mononitrate (IMDUR) 30 MG 24 hr tablet Take 1 tablet (30 mg total) by mouth daily.   lidocaine (LIDODERM) 5 % Apply 1 - 3 patches on to the skin as directed. 12 hours on and 12 hours off.   losartan (COZAAR) 25 MG tablet Take 1 tablet (25 mg total) by mouth daily. Take only when systolic BP greater than 110.   metoCLOPramide (REGLAN) 10 MG tablet Take 10 mg by mouth daily as needed for nausea or vomiting (upset stomach).   metoprolol succinate (TOPROL XL) 25 MG 24 hr tablet Take 1 tablet (25 mg total) by mouth daily.   nitroGLYCERIN (NITROSTAT) 0.4 MG SL tablet Place 1 tablet  (0.4 mg total) under the tongue every 5 (five) minutes as needed for chest pain.   omega-3 acid ethyl esters (LOVAZA) 1 g capsule Take by mouth daily.   ondansetron (ZOFRAN-ODT) 4 MG disintegrating tablet Take 1 tablet (4 mg total) by mouth every 8 (eight) hours as needed for nausea or vomiting.   pantoprazole (PROTONIX) 40 MG tablet Take 1 tablet (40 mg total) by mouth daily.   Semaglutide, 2 MG/DOSE, (OZEMPIC, 2 MG/DOSE,) 8 MG/3ML SOPN Inject 2 mg into the skin once a week.   tamsulosin (FLOMAX) 0.4 MG CAPS capsule Take 1 capsule (0.4 mg total) by mouth daily.   testosterone (ANDROGEL) 50 MG/5GM (1%) GEL Place 5 g (1 packet) onto the skin daily. Apply to shoulders and upper arms   tiZANidine (ZANAFLEX) 4 MG tablet Take 2 tablets (8 mg total) by mouth 3 (three) times daily.   traMADol (ULTRAM) 50 MG tablet Take 2 tablets (100 mg total) by mouth 3 (three) times daily as needed.   traZODone (DESYREL) 50 MG tablet TAKE 1-2 TABLETS (50-100 MG TOTAL) BY MOUTH AT BEDTIME AS NEEDED FOR SLEEP.   [DISCONTINUED] metoprolol succinate (TOPROL-XL) 50 MG 24 hr tablet Take 1 tablet (50 mg total) by mouth daily. Take with or immediately following a meal.     Allergies:   Kiwi extract, Other, Penicillins, Peach [prunus persica], Ancef [cefazolin], Latex, and Levaquin [levofloxacin]   Social History   Socioeconomic History   Marital status: Married    Spouse name: Not on file   Number of children: 4   Years of education: 16   Highest education level: Not on file  Occupational History   Occupation: disabled  Tobacco Use   Smoking status: Former    Types: Cigarettes    Quit date: 2007    Years since quitting: 16.1   Smokeless tobacco: Never  Vaping Use   Vaping Use: Never used  Substance and Sexual Activity   Alcohol use: Not Currently   Drug use: Not Currently   Sexual activity: Yes  Other Topics Concern   Not on file  Social History Narrative   Not on file   Social Determinants of Health    Financial Resource Strain: Not on file  Food Insecurity: Not on file  Transportation Needs: Not on file  Physical Activity: Not on file  Stress: Not on file  Social Connections: Not on file     Family History: The patient's family history includes Anxiety disorder in his father and mother; COPD in his mother; Cancer in his father; Depression in his father and mother; Other in his father. There is no history of Diabetes.  ROS:   Please see the history of present illness. Review of Systems  Constitutional:  Negative for chills and fever.  HENT:  Negative for congestion and sore throat.   Eyes:  Negative for blurred vision and discharge.  Respiratory:  Negative for cough and shortness of breath.   Cardiovascular:  Positive for chest pain. Negative for palpitations, orthopnea, claudication, leg swelling and PND.  Gastrointestinal:  Negative for nausea and vomiting.  Genitourinary:  Negative for dysuria and urgency.  Musculoskeletal:  Positive for back pain. Negative for myalgias.  Skin:  Negative for itching and rash.  Neurological:  Negative for dizziness and headaches.  Endo/Heme/Allergies:  Negative for polydipsia. Does not bruise/bleed easily.  Psychiatric/Behavioral:  The patient is not nervous/anxious and does not have insomnia.   All other systems reviewed and are negative.  EKGs/Labs/Other Studies Reviewed:    The following studies were reviewed today: Cath 01/17/21: Left heart cath 01/17/21: 2nd Diag-2 lesion is 75% stenosed. 2nd Diag-1 lesion is 75% stenosed. Dist LAD lesion is 50% stenosed. 2nd Mrg lesion is 75% stenosed. 1st Mrg lesion is 100% stenosed. RPDA lesion is 70% stenosed. Previously placed Mid RCA stent (unknown type) is widely patent. Dist RCA lesion is 50% stenosed. Ost RCA to Prox RCA lesion is 30% stenosed. Mid Cx lesion is 90% stenosed. Ost Cx to Prox Cx lesion is 75% stenosed. A drug-eluting stent was successfully placed using a STENT RESOLUTE  ONYX 3.5X15. Post intervention, there is a 0% residual stenosis. A drug-eluting stent was successfully placed using a STENT RESOLUTE ONYX 2.25X8. Post intervention, there is a 0% residual stenosis.   1. Non-obstructive disease in the LAD. Diffuse disease in the small to moderate caliber Diagonal branch, unchanged from last cath and not a good target for PCI.  2. Severe proximal Circumflex stenosis. Severe mid Circumflex stenosis. Chronic occlusion OM1. Severe diffuse disease in the small caliber second OM branch.  4. Patent proximal to mid RCA stent. Severe disease in the small caliber PDA, unchanged from last cath.  5. Successful PTCA/DES x 1 proximal Circumflex 6. Successful PTCA/DES x 1 mid Circumflex   Echo 08/13/2020  1. Left ventricular ejection fraction, by estimation, is 60 to 65%. The  left ventricle has normal function. The left ventricle has no regional  wall motion abnormalities. There is mild left ventricular hypertrophy.  Left ventricular diastolic parameters  are consistent with Grade I diastolic dysfunction (impaired relaxation).   2. Right ventricular systolic function is normal. The right ventricular  size is normal.   3. The mitral valve is normal in structure. No evidence of mitral valve  regurgitation. No evidence of mitral stenosis.   4. The aortic valve is tricuspid. Aortic valve regurgitation is not  visualized. No aortic stenosis is present.   5. The inferior vena cava is normal in size with greater than 50%  respiratory variability, suggesting right atrial pressure of 3 mmHg.  Cath 08/15/20: Mid RCA lesion is 90% stenosed. Prox Cx lesion is 75% stenosed. 2nd Mrg lesion is 75% stenosed. 1st Mrg lesion is 100% stenosed. Faint right to left and left to left collaterals.  RPDA lesion is 70% stenosed. 2nd Diag-1 lesion is 75% stenosed. 2nd Diag-2 lesion is 75% stenosed. Dist LAD lesion is 50% stenosed. A drug-eluting stent was successfully placed using a STENT  RESOLUTE ONYX 4.0X38, optimized with IVUS. Post intervention, there is a 0% residual stenosis. LV end diastolic pressure is normal. There is no aortic valve stenosis. 75 cm long sheath needed for support due to tortuosity in the right subclavian to engage the RCA. Left coronary could be engaged easily with short sheath.   Diffuse multivessel disease, but no LAD involvement.     RCA successfully treated.  Small vessel disease noted in the RPDA, OM1 and OM2, second diagonal and apical LAD.  Proximal circumflex would be a potential target for PCI if he had more symptoms.   CTA 08/14/20: 1. Left Main: 0.98. 2. LAD: Proximal: 0.97, distal: 0.87. 3. D1: Proximal: 0.75. 4. LCX: Proximal; 0.94, mid: Occluded. 5. RCA: Proximal: 0.98, distal: 0.77.   IMPRESSION: 1. CT FFR analysis showed significant stenoses in proximal RCA, ostial portion of a large D1 and occluded proximal portion of LCX artery. Left cardiac catheterization is recommended.   FINDINGS: A 100 kV prospective scan was triggered in the descending thoracic aorta at 111 HU's. Axial non-contrast 3 mm slices were carried out through the heart. The data set was analyzed on a dedicated work station and scored using the Agatson method. Gantry rotation speed was 250 msecs and collimation was .6 mm. 100 mg of PO Metoprolol and 0.8 mg of sl NTG were given. The 3D data set was reconstructed in 5% intervals of the 67-82 % of the R-R cycle. Diastolic phases were analyzed on a dedicated work station using MPR, MIP and VRT modes. The patient received 80 cc of contrast.   Aorta: Normal size. Mild diffuse atherosclerotic plaque and calcifications. No dissection.   Aortic Valve:  Trileaflet.  No calcifications.   Coronary Arteries:  Normal coronary origin.  Right dominance.   RCA is a large dominant artery that gives rise to PDA and PLA. There is mild calcified plaque in the proximal RCA with stenosis 25-49%. Mid RCA has severe diffuse  predominantly calcified plaque with a focal stenosis suspicious for > 70%. Distal RCA has moderate calcified plaque prior to bifurcation into PLA and PDA with stenosis 50-69%.   PLA is small with diffuse moderate plaque.   PDA is a medium caliber artery with diffuse moderate plaque in a bead like pattern.   Left main is a large artery that gives rise to LAD and LCX arteries. Left main is a long artery with minimal calcified plaque and stenosis 0-25%.   LAD is a large vessel that gives rise to two diagonal arteries. There is mild diffuse calcified plaque with focal stenoses in the proximal and mid LAD of 25-49%.   D1 is very small.   D2 is a medium caliber artery (2.1 mm) that has severe mixed ostial plaque with lipid rich core and is suspicious for stenosis > 70%.   LCX is a large caliber non-dominant artery that gives rise to two OM branches. Proximal LCX artery has severe non-calcified plaque suspicious for stenosis > 70%, this is followed by a moderate diffuse calcified plaque with stenosis 50-69%. Mid and distal LCX artery has small lumen and mild diffuse plaque.   OM1 is very small and has severe diffuse plaque.   OM2 is a medium caliber artery with diffuse moderate plaque in a bead like pattern.   Other findings:  Normal pulmonary vein drainage into the left atrium.   Normal left atrial appendage without a thrombus.   Normal size of the pulmonary artery.   IMPRESSION: 1. Coronary calcium score of 1201. This was 95 percentile for age and sex matched control.   2. Normal coronary origin with right dominance.   3. CAD-RADS 4 Severe stenosis. (70-99%). Diffuse CAD in a bead-like diabetic pattern, there is suspicion for severe stenoses in the mid RCA, ostial portion of a large D2 and proximal portion of LCX artery. Additional analysis with CT FFR will be submitted. We will tentatively plan for a left cardiac catheterization for tomorrow.   EKG:  EKG was not  ordered today  Recent Labs: 10/22/2021: ALT 15; BUN 16; Creatinine, Ser 0.91; Hemoglobin 14.2; Platelets 228; Potassium 3.9; Sodium 137  Recent Lipid Panel    Component Value Date/Time   CHOL 113 10/23/2020 0824   TRIG 74 10/23/2020 0824   HDL 45 10/23/2020 0824   CHOLHDL 2.5 10/23/2020 0824   CHOLHDL 5.0 08/13/2020 0119   VLDL 52 (H) 08/13/2020 0119   LDLCALC 53 10/23/2020 0824     Physical Exam:    VS:  BP 116/60   Pulse 74   Ht 5\' 9"  (1.753 m)   Wt 203 lb 12.8 oz (92.4 kg)   SpO2 94%   BMI 30.10 kg/m     Wt Readings from Last 3 Encounters:  10/24/21 203 lb 12.8 oz (92.4 kg)  10/23/21 204 lb (92.5 kg)  10/22/21 195 lb (88.5 kg)     GEN: Well nourished, well developed in no acute distress HEENT: Normal NECK: No JVD; No carotid bruits CARDIAC: RRR, no murmurs, rubs, gallops RESPIRATORY:  Clear to auscultation without rales, wheezing or rhonchi  ABDOMEN: Soft, non-tender, non-distended MUSCULOSKELETAL:  no edema; No deformity  SKIN: Warm and dry NEUROLOGIC:  Alert and oriented x 3 PSYCHIATRIC:  Normal affect   ASSESSMENT:    1. Coronary artery disease of native artery of native heart with stable angina pectoris (HCC)   2. Hypertension, unspecified type   3. OSA (obstructive sleep apnea)   4. Essential hypertension   5. Stable angina pectoris (HCC)   6. Primary hypertension   7. Mixed hyperlipidemia   8. Diabetes mellitus with coincident hypertension (HCC)   9. Obesity (BMI 30-39.9)      PLAN:    In order of problems listed above:  #Multivessel CAD s/p PCI to RCA and recent PCI x2 to Lcx: Patient with PCI to RCA with residual LCx 75%, 50% LAD,  100% OM1,  75% OM2, 75% D1 and D2 in 07/2020 with recurrent angina now s/p PCI to prox and mid Lcx on 01/17/21. Now with significant improvement of symptoms. Did have isolated episode of chest pain last week that resolved with nitro. He is otherwise active without issues. Will continue current therapies and monitor  symptoms -Continue ASA 81mg  daily -Continue plavix 75mg  daily -Continue lipitor 80mg  daily -Decrease metop to 25mg  XL daily given episode of hypotension -Continue imdur 30mg  daily -Off losartan due to lightheadedness -Continue nitro as needed for chest pain -If frequency of chest pain increases, can pursue ischemic work-up at that time  #HTN: Now with episode of hypotension as detailed above likely driven by weight loss. Stopped amlodipine and will decrease metop. -Off amlodipine -Decrease metop to 25mg  XL daily -Continue imdur 30mg  daily  #DMII: A1C significantly improved from 10-->6.5 -Continue insulin, jardiance and ozempic  #HLD: LDL at goal 53 in 09/2020. -Continue  lipitor 80mg  daily -Goal LDL<70  #Obesity: Significantly improved with BMI 35-->30  -Mediterranean diet -Continue exercise with goal 48min/day -Continue ozempic  #Known OSA not on CPAP: Patient with history of OSA now back on CPAP. -Continue CPAP  Medication Adjustments/Labs and Tests Ordered: Current medicines are reviewed at length with the patient today.  Concerns regarding medicines are outlined above.  No orders of the defined types were placed in this encounter.   Meds ordered this encounter  Medications   metoprolol succinate (TOPROL XL) 25 MG 24 hr tablet    Sig: Take 1 tablet (25 mg total) by mouth daily.    Dispense:  90 tablet    Refill:  2    DOSE DECREASE     Patient Instructions  Medication Instructions:   DECREASE YOUR METOPROLOL SUCCINATE (TOPROL XL) TO 25 MG BY MOUTH DAILY  *If you need a refill on your cardiac medications before your next appointment, please call your pharmacy*     Follow-Up:  WITH DR. Shari Prows ON 02/15/22 AT 9:40 AM      Carilyn Goodpasture Javier,acting as a scribe for Brandon Sprague, MD.,have documented all relevant documentation on the behalf of Brandon Sprague, MD,as directed by  Brandon Sprague, MD while in the presence of Brandon Sprague, MD.  I, Brandon Sprague, MD, have reviewed all documentation for this visit. The documentation on 10/24/21 for the exam, diagnosis, procedures, and orders are all accurate and complete.   Signed, Brandon Sprague, MD  10/24/2021 5:24 PM    [facility]

## 2021-10-24 NOTE — Patient Instructions (Signed)
Medication Instructions:  ? ?DECREASE YOUR METOPROLOL SUCCINATE (TOPROL XL) TO 25 MG BY MOUTH DAILY ? ?*If you need a refill on your cardiac medications before your next appointment, please call your pharmacy* ? ? ? ? ?Follow-Up: ? ?WITH DR. Johney Frame ON 02/15/22 AT 9:40 AM  ? ? ? ?

## 2021-10-25 ENCOUNTER — Other Ambulatory Visit (HOSPITAL_BASED_OUTPATIENT_CLINIC_OR_DEPARTMENT_OTHER): Payer: Self-pay

## 2021-10-25 ENCOUNTER — Other Ambulatory Visit: Payer: Self-pay

## 2021-10-25 MED ORDER — TIRZEPATIDE 10 MG/0.5ML ~~LOC~~ SOAJ
10.0000 mg | SUBCUTANEOUS | 0 refills | Status: DC
  Filled 2021-10-25: qty 2, 28d supply, fill #0

## 2021-10-25 NOTE — Telephone Encounter (Signed)
Spouse called requesting Brandon [name] be sent to [facility] pharmacy. Patient aware this has been done.  ?

## 2021-10-26 ENCOUNTER — Other Ambulatory Visit (HOSPITAL_BASED_OUTPATIENT_CLINIC_OR_DEPARTMENT_OTHER): Payer: Self-pay

## 2021-10-26 ENCOUNTER — Other Ambulatory Visit (HOSPITAL_COMMUNITY): Payer: Self-pay

## 2021-10-29 ENCOUNTER — Ambulatory Visit (INDEPENDENT_AMBULATORY_CARE_PROVIDER_SITE_OTHER): Payer: No Typology Code available for payment source | Admitting: Sports Medicine

## 2021-10-29 ENCOUNTER — Other Ambulatory Visit: Payer: Self-pay

## 2021-10-29 ENCOUNTER — Ambulatory Visit (INDEPENDENT_AMBULATORY_CARE_PROVIDER_SITE_OTHER): Payer: No Typology Code available for payment source

## 2021-10-29 ENCOUNTER — Other Ambulatory Visit (HOSPITAL_BASED_OUTPATIENT_CLINIC_OR_DEPARTMENT_OTHER): Payer: Self-pay

## 2021-10-29 DIAGNOSIS — I25118 Atherosclerotic heart disease of native coronary artery with other forms of angina pectoris: Secondary | ICD-10-CM

## 2021-10-29 DIAGNOSIS — R052 Subacute cough: Secondary | ICD-10-CM | POA: Diagnosis not present

## 2021-10-29 DIAGNOSIS — R059 Cough, unspecified: Secondary | ICD-10-CM | POA: Insufficient documentation

## 2021-10-29 MED ORDER — AZITHROMYCIN 250 MG PO TABS
ORAL_TABLET | ORAL | 0 refills | Status: DC
  Filled 2021-10-29: qty 6, 5d supply, fill #0

## 2021-10-29 MED ORDER — PREDNISONE 50 MG PO TABS
50.0000 mg | ORAL_TABLET | Freq: Every day | ORAL | 0 refills | Status: DC
  Filled 2021-10-29: qty 5, 5d supply, fill #0

## 2021-10-29 MED ORDER — ALBUTEROL SULFATE HFA 108 (90 BASE) MCG/ACT IN AERS
2.0000 | INHALATION_SPRAY | Freq: Four times a day (QID) | RESPIRATORY_TRACT | 11 refills | Status: DC | PRN
  Filled 2021-10-29: qty 17, 50d supply, fill #0

## 2021-10-29 MED ORDER — CARESTART COVID-19 HOME TEST VI KIT
PACK | 0 refills | Status: DC
  Filled 2021-10-29: qty 2, 4d supply, fill #0

## 2021-10-29 NOTE — Assessment & Plan Note (Signed)
This is a pleasant 66 year old male, he is here over a week of fatigue, malaise, coughing, low-grade fevers, no vomiting, nausea, diarrhea, no skin rash. ?Was seen in the emergency department, chest x-ray was clear and a viral panel including flu and COVID testing was negative. ?He continues to have cough, fatigue, mild shortness of breath. ?On exam he has coarse sounds right upper lobe with diffuse expiratory wheezing. ?I am going to treat him aggressively, 5 days of prednisone, albuterol, azithromycin, I would like a sputum culture and an additional chest x-ray. ?Return to see PCP if not better in a week. ?

## 2021-10-29 NOTE — Progress Notes (Signed)
? ? ?  Procedures performed today:   ? ?None. ? ?Independent interpretation of notes and tests performed by another provider:  ? ?None. ? ?Brief History, Exam, Impression, and Recommendations:   ? ?Coughing ?This is a pleasant 66 year old male, he is here over a week of fatigue, malaise, coughing, low-grade fevers, no vomiting, nausea, diarrhea, no skin rash. ?Was seen in the emergency department, chest x-ray was clear and a viral panel including flu and COVID testing was negative. ?He continues to have cough, fatigue, mild shortness of breath. ?On exam he has coarse sounds right upper lobe with diffuse expiratory wheezing. ?I am going to treat him aggressively, 5 days of prednisone, albuterol, azithromycin, I would like a sputum culture and an additional chest x-ray. ?Return to see PCP if not better in a week. ? ? ? ?___________________________________________ ?Gwen Her. Dianah Field, M.D., ABFM., CAQSM. ?Primary Care and Sports Medicine ?[place] ? ?Adjunct Instructor of Family Medicine  ?[employer] ?

## 2021-10-30 ENCOUNTER — Ambulatory Visit: Payer: No Typology Code available for payment source | Admitting: Nutrition

## 2021-10-31 ENCOUNTER — Emergency Department (HOSPITAL_COMMUNITY): Payer: No Typology Code available for payment source

## 2021-10-31 ENCOUNTER — Encounter (HOSPITAL_COMMUNITY): Payer: Self-pay | Admitting: *Deleted

## 2021-10-31 ENCOUNTER — Observation Stay (HOSPITAL_COMMUNITY)
Admission: EM | Admit: 2021-10-31 | Discharge: 2021-11-02 | Disposition: A | Discharge status: Home / Self Care | Payer: No Typology Code available for payment source | Attending: Emergency Medicine | Admitting: Emergency Medicine

## 2021-10-31 ENCOUNTER — Other Ambulatory Visit: Payer: Self-pay

## 2021-10-31 DIAGNOSIS — R404 Transient alteration of awareness: Secondary | ICD-10-CM

## 2021-10-31 DIAGNOSIS — Z794 Long term (current) use of insulin: Secondary | ICD-10-CM | POA: Insufficient documentation

## 2021-10-31 DIAGNOSIS — R791 Abnormal coagulation profile: Secondary | ICD-10-CM | POA: Insufficient documentation

## 2021-10-31 DIAGNOSIS — E119 Type 2 diabetes mellitus without complications: Secondary | ICD-10-CM | POA: Diagnosis not present

## 2021-10-31 DIAGNOSIS — Z7982 Long term (current) use of aspirin: Secondary | ICD-10-CM | POA: Insufficient documentation

## 2021-10-31 DIAGNOSIS — Z955 Presence of coronary angioplasty implant and graft: Secondary | ICD-10-CM

## 2021-10-31 DIAGNOSIS — R410 Disorientation, unspecified: Secondary | ICD-10-CM | POA: Diagnosis present

## 2021-10-31 DIAGNOSIS — Z85828 Personal history of other malignant neoplasm of skin: Secondary | ICD-10-CM | POA: Diagnosis not present

## 2021-10-31 DIAGNOSIS — Z7984 Long term (current) use of oral hypoglycemic drugs: Secondary | ICD-10-CM | POA: Diagnosis not present

## 2021-10-31 DIAGNOSIS — Z87891 Personal history of nicotine dependence: Secondary | ICD-10-CM | POA: Insufficient documentation

## 2021-10-31 DIAGNOSIS — Z7951 Long term (current) use of inhaled steroids: Secondary | ICD-10-CM | POA: Diagnosis not present

## 2021-10-31 DIAGNOSIS — J189 Pneumonia, unspecified organism: Secondary | ICD-10-CM | POA: Diagnosis present

## 2021-10-31 DIAGNOSIS — I25118 Atherosclerotic heart disease of native coronary artery with other forms of angina pectoris: Secondary | ICD-10-CM

## 2021-10-31 DIAGNOSIS — Z79899 Other long term (current) drug therapy: Secondary | ICD-10-CM | POA: Insufficient documentation

## 2021-10-31 DIAGNOSIS — R4182 Altered mental status, unspecified: Principal | ICD-10-CM

## 2021-10-31 DIAGNOSIS — J069 Acute upper respiratory infection, unspecified: Secondary | ICD-10-CM | POA: Insufficient documentation

## 2021-10-31 DIAGNOSIS — W19XXXA Unspecified fall, initial encounter: Secondary | ICD-10-CM

## 2021-10-31 DIAGNOSIS — I2510 Atherosclerotic heart disease of native coronary artery without angina pectoris: Secondary | ICD-10-CM | POA: Diagnosis not present

## 2021-10-31 DIAGNOSIS — R2689 Other abnormalities of gait and mobility: Secondary | ICD-10-CM | POA: Diagnosis not present

## 2021-10-31 DIAGNOSIS — I10 Essential (primary) hypertension: Secondary | ICD-10-CM | POA: Diagnosis present

## 2021-10-31 DIAGNOSIS — G894 Chronic pain syndrome: Secondary | ICD-10-CM | POA: Diagnosis present

## 2021-10-31 DIAGNOSIS — Z7902 Long term (current) use of antithrombotics/antiplatelets: Secondary | ICD-10-CM | POA: Diagnosis not present

## 2021-10-31 DIAGNOSIS — Z91040 Latex allergy status: Secondary | ICD-10-CM | POA: Diagnosis not present

## 2021-10-31 DIAGNOSIS — E1165 Type 2 diabetes mellitus with hyperglycemia: Secondary | ICD-10-CM

## 2021-10-31 DIAGNOSIS — G959 Disease of spinal cord, unspecified: Secondary | ICD-10-CM

## 2021-10-31 DIAGNOSIS — Z20822 Contact with and (suspected) exposure to covid-19: Secondary | ICD-10-CM | POA: Insufficient documentation

## 2021-10-31 LAB — I-STAT VENOUS BLOOD GAS, ED
Acid-Base Excess: 5 mmol/L — ABNORMAL HIGH (ref 0.0–2.0)
Bicarbonate: 27.2 mmol/L (ref 20.0–28.0)
Calcium, Ion: 0.98 mmol/L — ABNORMAL LOW (ref 1.15–1.40)
HCT: 41 % (ref 39.0–52.0)
Hemoglobin: 13.9 g/dL (ref 13.0–17.0)
O2 Saturation: 99 %
Potassium: 3.4 mmol/L — ABNORMAL LOW (ref 3.5–5.1)
Sodium: 132 mmol/L — ABNORMAL LOW (ref 135–145)
TCO2: 28 mmol/L (ref 22–32)
pCO2, Ven: 31.9 mmHg — ABNORMAL LOW (ref 44–60)
pH, Ven: 7.538 — ABNORMAL HIGH (ref 7.25–7.43)
pO2, Ven: 137 mmHg — ABNORMAL HIGH (ref 32–45)

## 2021-10-31 LAB — CBC WITH DIFFERENTIAL/PLATELET
Abs Immature Granulocytes: 0.12 10*3/uL — ABNORMAL HIGH (ref 0.00–0.07)
Basophils Absolute: 0 10*3/uL (ref 0.0–0.1)
Basophils Relative: 0 %
Eosinophils Absolute: 0 10*3/uL (ref 0.0–0.5)
Eosinophils Relative: 0 %
HCT: 41.3 % (ref 39.0–52.0)
Hemoglobin: 13.3 g/dL (ref 13.0–17.0)
Immature Granulocytes: 1 %
Lymphocytes Relative: 3 %
Lymphs Abs: 0.5 10*3/uL — ABNORMAL LOW (ref 0.7–4.0)
MCH: 27.1 pg (ref 26.0–34.0)
MCHC: 32.2 g/dL (ref 30.0–36.0)
MCV: 84.1 fL (ref 80.0–100.0)
Monocytes Absolute: 0.7 10*3/uL (ref 0.1–1.0)
Monocytes Relative: 4 %
Neutro Abs: 16.5 10*3/uL — ABNORMAL HIGH (ref 1.7–7.7)
Neutrophils Relative %: 92 %
Platelets: 248 10*3/uL (ref 150–400)
RBC: 4.91 MIL/uL (ref 4.22–5.81)
RDW: 19.4 % — ABNORMAL HIGH (ref 11.5–15.5)
WBC: 17.8 10*3/uL — ABNORMAL HIGH (ref 4.0–10.5)
nRBC: 0 % (ref 0.0–0.2)

## 2021-10-31 LAB — URINALYSIS, ROUTINE W REFLEX MICROSCOPIC
Bacteria, UA: NONE SEEN
Bilirubin Urine: NEGATIVE
Glucose, UA: >=500 mg/dL — AB
Hgb urine dipstick: NEGATIVE
Ketones, ur: 20 mg/dL — AB
Leukocytes,Ua: NEGATIVE
Nitrite: NEGATIVE
Protein, ur: NEGATIVE mg/dL
Specific Gravity, Urine: >1.046 — ABNORMAL HIGH (ref 1.005–1.030)
pH: 6 (ref 5.0–8.0)

## 2021-10-31 LAB — BASIC METABOLIC PANEL
Anion gap: 11 (ref 5–15)
BUN: 12 mg/dL (ref 8–23)
CO2: 24 mmol/L (ref 22–32)
Calcium: 8.4 mg/dL — ABNORMAL LOW (ref 8.9–10.3)
Chloride: 98 mmol/L (ref 98–111)
Creatinine, Ser: 0.84 mg/dL (ref 0.61–1.24)
GFR, Estimated: >60 mL/min (ref >60)
Glucose, Bld: 224 mg/dL — ABNORMAL HIGH (ref 70–99)
Potassium: 3.6 mmol/L (ref 3.5–5.1)
Sodium: 133 mmol/L — ABNORMAL LOW (ref 135–145)

## 2021-10-31 LAB — RESP PANEL BY RT-PCR (FLU A&B, COVID) ARPGX2
Influenza A by PCR: NEGATIVE
Influenza B by PCR: NEGATIVE
SARS Coronavirus 2 by RT PCR: NEGATIVE

## 2021-10-31 LAB — CBG MONITORING, ED: Glucose-Capillary: 216 mg/dL — ABNORMAL HIGH (ref 70–99)

## 2021-10-31 LAB — PROTIME-INR
INR: 1.3 — ABNORMAL HIGH (ref 0.8–1.2)
Prothrombin Time: 16.1 seconds — ABNORMAL HIGH (ref 11.4–15.2)

## 2021-10-31 LAB — LACTIC ACID, PLASMA
Lactic Acid, Venous: 1.6 mmol/L (ref 0.5–1.9)
Lactic Acid, Venous: 1.2 mmol/L (ref 0.5–1.9)

## 2021-10-31 LAB — APTT: aPTT: 42 seconds — ABNORMAL HIGH (ref 24–36)

## 2021-10-31 LAB — TROPONIN I (HIGH SENSITIVITY)
Troponin I (High Sensitivity): 41 ng/L — ABNORMAL HIGH (ref <18)
Troponin I (High Sensitivity): 32 ng/L — ABNORMAL HIGH (ref <18)

## 2021-10-31 MED ORDER — SODIUM CHLORIDE 0.9 % IV SOLN
2.0000 g | INTRAVENOUS | Status: DC
  Administered 2021-10-31 – 2021-11-01 (×2): 2 g via INTRAVENOUS
  Filled 2021-10-31 (×2): qty 20

## 2021-10-31 MED ORDER — LACTATED RINGERS IV SOLN: INTRAVENOUS | Status: AC

## 2021-10-31 MED ORDER — IOHEXOL 350 MG/ML SOLN
100.0000 mL | Freq: Once | INTRAVENOUS | Status: AC | PRN
  Administered 2021-10-31: 100 mL via INTRAVENOUS

## 2021-10-31 MED ORDER — SODIUM CHLORIDE 0.9 % IV SOLN
500.0000 mg | INTRAVENOUS | Status: DC
  Administered 2021-10-31 – 2021-11-01 (×2): 500 mg via INTRAVENOUS
  Filled 2021-10-31 (×2): qty 5

## 2021-10-31 NOTE — H&P (Signed)
History and Physical    Patient: Brandon [name] ZOX:[id number] DOB: 1956-03-18 DOA: 10/31/2021 DOS: the patient was seen and examined on 11/01/2021 PCP: Luetta Nutting, DO  Patient coming from: Home  Chief Complaint:  Chief Complaint  Patient presents with   Cough   HPI: Brandon [name] is a 66 y.o. 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   Reports last week he had URI symptoms initially took OTC meds and then saw PCP earlier this week after he noticed more chest congestion. Put on azithromycin, prednisone and albuterol inhaler. Shortly after this appointment on Monday he felt worse. Became more lethargic and had difficulty word finding. He then slept for about 18 hours. Tuesday again had episodes of confusion and trouble word finding. Then this morning around 11:30am couldn't find words to finish his thoughts so he decided to come to ED. He checked blood pressure and glucose during one of the episodes and systolic was 811 and sugar was "high."  He also noticed worsening left leg heaviness. 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 He has a fall two days ago and thinks maybe he was already dragging his leg more than usual. Today he definitely had trouble going up stairs because of increasing left leg heaviness.  He is on aspirin and Plavix daily for stents and has not missed dose.   He was afebrile, normotensive on room air. Has leukocytosis 17.8K.  BMP otherwise unremarkable with BG of 224. CTA of the chest was negative for pulmonary embolism but had patchy consolidation right worse than left consistent with pneumonia.  CTA head and neck was obtained for due to concerns of possible TIA.  No acute intracranial process.  No large vessel occlusion or stenosis.  There is multifocal mild  irregularity in the proximal right medial P3 segment.  Review of Systems: As mentioned in the history of present illness. All other systems reviewed and are negative. Past Medical History:  Diagnosis Date   Anxiety    Basal cell carcinoma (BCC) in situ of skin    Colon polyps    Coronary artery disease    S/p DES to mRCA in 12/21 // S/p DES to LCx x 2 in 12/2020 // Diff dz in small to mod Dx (not a good target for PCI); OM1 100 CTO; severe dz in small OM2 and PDA >> Med Rx   Depression    Diabetes mellitus without complication ([place])    HLD (hyperlipidemia)    Hypertension    SBO (small bowel obstruction) ([place])    Past Surgical History:  Procedure Laterality Date   BACK SURGERY     CARDIAC CATHETERIZATION     CATARACT EXTRACTION W/ INTRAOCULAR LENS  IMPLANT, BILATERAL     CHOLECYSTECTOMY     CHOLECYSTECTOMY, LAPAROSCOPIC     CORONARY STENT INTERVENTION N/A 08/15/2020   Procedure: CORONARY STENT INTERVENTION;  Surgeon: Jettie Booze, MD;  Location: [place] [facility];  Service: Cardiovascular;  Laterality: N/A;   CORONARY STENT INTERVENTION N/A 01/17/2021   Procedure: CORONARY STENT INTERVENTION;  Surgeon: Burnell Blanks, MD;  Location: [place] [facility];  Service: Cardiovascular;  Laterality: N/A;   EYE SURGERY     INTRAVASCULAR ULTRASOUND/IVUS N/A 08/15/2020   Procedure: Intravascular Ultrasound/IVUS;  Surgeon: Irish Lack,  Charlann Lange, MD;  Location: [place] [facility];  Service: Cardiovascular;  Laterality: N/A;   LEFT HEART CATH AND CORONARY ANGIOGRAPHY N/A 08/15/2020   Procedure: LEFT HEART CATH AND CORONARY ANGIOGRAPHY;  Surgeon: Jettie Booze, MD;  Location: [place] [facility];  Service: Cardiovascular;  Laterality: N/A;   LEFT HEART CATH AND CORONARY ANGIOGRAPHY N/A 01/17/2021   Procedure: LEFT HEART CATH AND CORONARY ANGIOGRAPHY;  Surgeon: Burnell Blanks, MD;  Location: [place] [facility];  Service: Cardiovascular;  Laterality: N/A;   LUMBAR FUSION      L3-5 with rods   XI ROBOTIC ASSISTED INGUINAL HERNIA REPAIR WITH MESH     x 3 surgeries   Social History:  reports that he quit smoking about 16 years ago. His smoking use included cigarettes. He has never used smokeless tobacco. He reports that he does not currently use alcohol. He reports that he does not currently use drugs.  Allergies  Allergen Reactions   Kiwi Extract Swelling    Mouth swelling.    Other Swelling and Other (See Comments)    EGGPLANT. Mouth swelling.   Penicillins Rash    Reaction: unknown   Peach [Prunus Persica] Other (See Comments)    Burns mouth   Ancef [Cefazolin] Rash   Latex Rash   Levaquin [Levofloxacin] Rash    Family History  Problem Relation Age of Onset   COPD Mother    Anxiety disorder Mother    Depression Mother    Cancer Father    Anxiety disorder Father    Depression Father    Other Father    Diabetes Neg Hx     Prior to Admission medications   Medication Sig Start Date End Date Taking? Authorizing Provider  albuterol (VENTOLIN HFA) 108 (90 Base) MCG/ACT inhaler Inhale 2 puffs by mouth  into the lungs every 6 (six) hours as needed for wheezing. 10/29/21   Silverio Decamp, MD  amLODipine (NORVASC) 10 MG tablet Take 1/2 tablet (5 mg total) by mouth daily. 07/17/21   Freada Bergeron, MD  ARIPiprazole (ABILIFY) 10 MG tablet Take 10 mg by mouth every morning.    [provider]  aspirin 81 MG EC tablet Take 81 mg by mouth every morning.    [provider]  atorvastatin (LIPITOR) 80 MG tablet TAKE 1 TABLET (80 MG TOTAL) BY MOUTH DAILY. 07/17/21 07/17/22  Freada Bergeron, MD  azithromycin (ZITHROMAX Z-PAK) 250 MG tablet Take 2 tablets (500 mg) by mouth on  Day 1,  followed by 1 tablet (250 mg) once daily on Days 2 through 5. 10/29/21   Silverio Decamp, MD  b complex vitamins capsule Take 1 capsule by mouth daily.    [provider]  buprenorphine (BUTRANS) 20 MCG/HR PTWK Place 1 patch onto the  skin once a week. 10/11/21 01/31/22  Gillis Santa, MD  buPROPion (WELLBUTRIN XL) 150 MG 24 hr tablet Take 1 tablet (150 mg total) by mouth daily. 08/21/21   Luetta Nutting, DO  buPROPion (WELLBUTRIN XL) 300 MG 24 hr tablet Take 1 tablet (300 mg total) by mouth daily. 08/21/21   Luetta Nutting, DO  clopidogrel (PLAVIX) 75 MG tablet TAKE 1 TABLET (75 MG TOTAL) BY MOUTH DAILY WITH BREAKFAST. 07/17/21 07/17/22  Freada Bergeron, MD  Continuous Blood Gluc Sensor (DEXCOM G6 SENSOR) MISC Use as directed. Change sensor every 10 days 11/25/20   Renato Shin, MD  COVID-19 At Home Antigen Test [facility] COVID-19 HOME TEST) KIT Use as  directed 10/29/21   Clementeen Graham, RPH  empagliflozin (JARDIANCE) 25 MG TABS tablet Take 1 tablet (25 mg total) by mouth daily. 08/01/21   Luetta Nutting, DO  glucose blood test strip USE 1 TO CHECK BLOOD GLUCOSE UP TO 5 TIMES DAILY. Patient taking differently: USE 1 TO CHECK BLOOD GLUCOSE UP TO 5 TIMES DAILY. 10/06/20 11/18/21  Luetta Nutting, DO  Insulin Asp Prot & Asp FlexPen (NOVOLOG 70/30 MIX) (70-30) 100 UNIT/ML FlexPen See admin instructions. 06/14/21   [provider]  insulin lispro (HUMALOG) 100 UNIT/ML injection For use in pump, total of 60 units per day 10/19/21   Renato Shin, MD  Iron, Ferrous Sulfate, 325 (65 Fe) MG TABS Take 325 mg by mouth daily. 10/24/21   Luetta Nutting, DO  isosorbide mononitrate (IMDUR) 30 MG 24 hr tablet Take 1 tablet (30 mg total) by mouth daily. 07/17/21   Freada Bergeron, MD  lidocaine (LIDODERM) 5 % Apply 1 - 3 patches on to the skin as directed. 12 hours on and 12 hours off. 08/10/21     losartan (COZAAR) 25 MG tablet Take 1 tablet (25 mg total) by mouth daily. Take only when systolic BP greater than 893. 12/04/20   Pemberton, Greer Ee, MD  metoCLOPramide (REGLAN) 10 MG tablet Take 10 mg by mouth daily as needed for nausea or vomiting (upset stomach).    [provider]  metoprolol succinate (TOPROL XL) 25 MG 24 hr  tablet Take 1 tablet (25 mg total) by mouth daily. 10/24/21   Freada Bergeron, MD  nitroGLYCERIN (NITROSTAT) 0.4 MG SL tablet Place 1 tablet (0.4 mg total) under the tongue every 5 (five) minutes as needed for chest pain. 09/26/21 10/26/21  Luetta Nutting, DO  nystatin (MYCOSTATIN) 100000 UNIT/ML suspension Use as directed 5 mLs (500,000 Units total) in the mouth or throat 4 (four) times daily for 7 days. 05/21/21   Luetta Nutting, DO  omega-3 acid ethyl esters (LOVAZA) 1 g capsule Take by mouth daily.    [provider]  ondansetron (ZOFRAN-ODT) 4 MG disintegrating tablet Take 1 tablet (4 mg total) by mouth every 8 (eight) hours as needed for nausea or vomiting. 09/26/21   Luetta Nutting, DO  pantoprazole (PROTONIX) 40 MG tablet Take 1 tablet (40 mg total) by mouth daily. 08/29/21   Luetta Nutting, DO  prednisoLONE-lidocaine-nystatin-diphenhydrAMINE-alum & mag hydroxide-simeth Swish and spit 78mls 3 times daily as needed for mouth pain. 03/19/21   Luetta Nutting, DO  predniSONE (DELTASONE) 50 MG tablet Take 1 tablet (50 mg total) by mouth daily. 10/29/21   Silverio Decamp, MD  Semaglutide, 2 MG/DOSE, (OZEMPIC, 2 MG/DOSE,) 8 MG/3ML SOPN Inject 2 mg into the skin once a week. 05/29/21   Renato Shin, MD  tamsulosin (FLOMAX) 0.4 MG CAPS capsule Take 1 capsule (0.4 mg total) by mouth daily. 06/15/21   Luetta Nutting, DO  testosterone (ANDROGEL) 50 MG/5GM (1%) GEL Place 5 g (1 packet) onto the skin daily. Apply to shoulders and upper arms 07/25/21 11/25/21  Luetta Nutting, DO  tirzepatide [facility]) 10 MG/0.5ML Pen Inject 10 mg into the skin once a week. 10/25/21   Luetta Nutting, DO  tiZANidine (ZANAFLEX) 4 MG tablet Take 2 tablets (8 mg total) by mouth 3 (three) times daily. 12/26/20   Silverio Decamp, MD  traMADol (ULTRAM) 50 MG tablet Take 2 tablets (100 mg total) by mouth 3 (three) times daily as needed. 06/01/21 11/28/21  Silverio Decamp, MD  traZODone (DESYREL) 50 MG  tablet TAKE 1-2  TABLETS (50-100 MG TOTAL) BY MOUTH AT BEDTIME AS NEEDED FOR SLEEP. 06/01/21 06/01/22  Luetta Nutting, DO    Physical Exam: Vitals:   10/31/21 1911 10/31/21 2111 10/31/21 2145 10/31/21 2215  BP: 119/66 125/75 137/65 133/70  Pulse: 81 87 87 88  Resp: _0 Temp:      TempSrc:      SpO2: 93% 95% 93% 95%   Constitutional: NAD, calm, comfortable, non-toxic appearing sitting upright  Eyes: lids and conjunctivae normal ENMT: Mucous membranes are moist.  Neck: normal, supple Respiratory: clear to auscultation bilaterally, no wheezing, no crackles. Normal respiratory effort on room air. No accessory muscle use.  Cardiovascular: Regular rate and rhythm, no murmurs / rubs / gallops. No extremity edema. 2+ pedal pulses. Abdomen: no tenderness, Bowel sounds positive.  Musculoskeletal: no clubbing / cyanosis. No joint deformity upper and lower extremities. Good ROM, no contractures. Normal muscle tone.  Skin: no rashes, lesions, ulcers.  Neurologic: CN 2-12 grossly intact. Sensation intact, DTR normal. Strength4/5 in left LE Psychiatric: Normal judgment and insight. Alert and oriented x 3. Normal mood. Data Reviewed:  See HPI  Assessment and Plan: * AMS (altered mental status) AMS/Trouble word finding/worsening left LE weakness -acute metabolic encephalopathy vs TIA. 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  -CTA head and neck negative for any intracranial large vessel occlusion but had focal moderate narrowing in the proximal right medial P3 segment. Discussed with Dr. Theda Sers Neurology and he recommended increase aspirin to 359m daily.  -pt has severe anxiety to MRI and requires sedation. Will obtain CT head without contrast tomorrow per neurology -obtain Echo -Obtain Lipid panel, HbA1C  -PT/OT/SLP   Community acquired pneumonia -continue IV Rocephin and Azithromcyin   Coronary artery  disease pt recently has had episodes of hyptotension and was recently taken off amlodipine and had decrease of metoprolol continue metoprolol  Primary hypertension -Continue aspirin, Plavix, lipitor, Imdur   Chronic pain syndrome Continue home [place]     Advance Care Planning:   Code Status: Full Code   Consults: Neurology  Family Communication: Discussed with Wife at bedside  Severity of Illness: The appropriate patient status for this patient is OBSERVATION. Observation status is judged to be reasonable and necessary in order to provide the required intensity of service to ensure the patient's safety. The patient's presenting symptoms, physical exam findings, and initial radiographic and laboratory data in the context of their medical condition is felt to place them at decreased risk for further clinical deterioration. Furthermore, it is anticipated that the patient will be medically stable for discharge from the hospital within 2 midnights of admission.   Author: COrene Desanctis DO 11/01/2021 2:43 AM  For on call review [email or web address]

## 2021-10-31 NOTE — ED Triage Notes (Signed)
Pt arrived by [facility] from home reports diagnosed with sinus infection on Monday and started on prednisone, wanted to be seen again due to not feeling any better and cbg is now 381. ?

## 2021-10-31 NOTE — Sepsis Progress Note (Signed)
Monitoring for the code sepsis protocol. °

## 2021-10-31 NOTE — ED Notes (Signed)
To CT via cart

## 2021-10-31 NOTE — ED Notes (Signed)
Hospitalist at bedside 

## 2021-10-31 NOTE — ED Provider Triage Note (Signed)
Emergency Medicine Provider Triage Evaluation Note ? ?Brandon [name] , a 66 y.o. male  was evaluated in triage.  Pt complains of shortness of breath, productive cough, labile blood sugars, overall weakness, recent fall.  Patient reports that he has 3 stents, is taking Plavix, has not missed any doses.  He denies any chest pain at this time.  Patient denies any history of COPD, asthma.  He arrives on 2 L nasal cannula, is not normally on any oxygen at home.  Patient was recently seen and diagnosed with presumed sinus infection started on prednisone, azithromycin by his PCP.  Patient reports that he is feeling no better. ? ?Review of Systems  ?Positive: Shob, productive cough, weakness ?Negative: Chest pain, NVD ? ?Physical Exam  ?BP 101/62 (BP Location: Right Arm)   Pulse 84   Temp 97.8 ?F (36.6 ?C) (Oral)   Resp 16   SpO2 94%  ?Gen:   Awake, no distress   ?Resp:  Slightly decreased effort, no acute distress, no accessory breath sounds noted ?MSK:   Moves extremities without difficulty  ?Other:  No TTP abdomen ? ?Medical Decision Making  ?Medically screening exam initiated at 1:21 PM.  Appropriate orders placed.  Brandon [name] was informed that the remainder of the evaluation will be completed by another provider, this initial triage assessment does not replace that evaluation, and the importance of remaining in the ED until their evaluation is complete. ? ?Workup initiated ?  ?Olene Floss, PA-C ?10/31/21 1323 ? ?

## 2021-10-31 NOTE — ED Provider Notes (Signed)
[facility] EMERGENCY DEPARTMENT Provider Note   CSN: [id number] Arrival date & time: 10/31/21  1306     History  Chief Complaint  Patient presents with   Cough    Brandon [name] is a 66 y.o. male with a past medical history of diabetes, hypertension, hyperlipidemia CAD presenting to the ED with a chief complaint of cough, shortness of breath, labile blood sugars, generalized weakness.  He denies chest pain.  He does not wear supplemental oxygen at baseline.  He was seen and diagnosed with presumed sinus infection and started on azithromycin, prednisone by his PCP but states that his symptoms have not improved.  He feels more short of breath recently.  Wife also notes that he had 1 episode 2 days ago and then another episode today at around noon when he had "slurred speech."  Son was concerned that he was "confused."  His symptoms have resolved since being in the ER.  He denies any leg swelling, history of DVT or PE that he is aware of although he does have a cardiac history, is taking Plavix and has not missed any doses.  He has stents present as well.  He reports cough is productive with mucus.  He did take steroid and antibiotic today.   Cough Associated symptoms: shortness of breath   Associated symptoms: no chest pain, no chills, no ear pain, no fever, no myalgias, no rash, no rhinorrhea, no sore throat and no wheezing       Home Medications Prior to Admission medications   Medication Sig Start Date End Date Taking? Authorizing Provider  albuterol (VENTOLIN HFA) 108 (90 Base) MCG/ACT inhaler Inhale 2 puffs by mouth  into the lungs every 6 (six) hours as needed for wheezing. 10/29/21   Silverio Decamp, MD  amLODipine (NORVASC) 10 MG tablet Take 1/2 tablet (5 mg total) by mouth daily. 07/17/21   Freada Bergeron, MD  ARIPiprazole (ABILIFY) 10 MG tablet Take 10 mg by mouth every morning.    [provider]  aspirin 81 MG EC tablet Take 81 mg by mouth  every morning.    [provider]  atorvastatin (LIPITOR) 80 MG tablet TAKE 1 TABLET (80 MG TOTAL) BY MOUTH DAILY. 07/17/21 07/17/22  Freada Bergeron, MD  azithromycin (ZITHROMAX Z-PAK) 250 MG tablet Take 2 tablets (500 mg) by mouth on  Day 1,  followed by 1 tablet (250 mg) once daily on Days 2 through 5. 10/29/21   Silverio Decamp, MD  b complex vitamins capsule Take 1 capsule by mouth daily.    [provider]  buprenorphine (BUTRANS) 20 MCG/HR PTWK Place 1 patch onto the skin once a week. 10/11/21 01/31/22  Gillis Santa, MD  buPROPion (WELLBUTRIN XL) 150 MG 24 hr tablet Take 1 tablet (150 mg total) by mouth daily. 08/21/21   Luetta Nutting, DO  buPROPion (WELLBUTRIN XL) 300 MG 24 hr tablet Take 1 tablet (300 mg total) by mouth daily. 08/21/21   Luetta Nutting, DO  clopidogrel (PLAVIX) 75 MG tablet TAKE 1 TABLET (75 MG TOTAL) BY MOUTH DAILY WITH BREAKFAST. 07/17/21 07/17/22  Freada Bergeron, MD  Continuous Blood Gluc Sensor (DEXCOM G6 SENSOR) MISC Use as directed. Change sensor every 10 days 11/25/20   Renato Shin, MD  COVID-19 At Home Antigen Test [facility] COVID-19 HOME TEST) KIT Use as directed 10/29/21   Clementeen Graham, [facility]  empagliflozin (JARDIANCE) 25 MG TABS tablet Take 1 tablet (25 mg total) by mouth  daily. 08/01/21   Luetta Nutting, DO  glucose blood test strip USE 1 TO CHECK BLOOD GLUCOSE UP TO 5 TIMES DAILY. Patient taking differently: USE 1 TO CHECK BLOOD GLUCOSE UP TO 5 TIMES DAILY. 10/06/20 11/18/21  Luetta Nutting, DO  Insulin Asp Prot & Asp FlexPen (NOVOLOG 70/30 MIX) (70-30) 100 UNIT/ML FlexPen See admin instructions. 06/14/21   [provider]  insulin lispro (HUMALOG) 100 UNIT/ML injection For use in pump, total of 60 units per day 10/19/21   Renato Shin, MD  Iron, Ferrous Sulfate, 325 (65 Fe) MG TABS Take 325 mg by mouth daily. 10/24/21   Luetta Nutting, DO  isosorbide mononitrate (IMDUR) 30 MG 24 hr tablet Take 1 tablet (30 mg total) by  mouth daily. 07/17/21   Freada Bergeron, MD  lidocaine (LIDODERM) 5 % Apply 1 - 3 patches on to the skin as directed. 12 hours on and 12 hours off. 08/10/21     losartan (COZAAR) 25 MG tablet Take 1 tablet (25 mg total) by mouth daily. Take only when systolic BP greater than 446. 12/04/20   Pemberton, Greer Ee, MD  metoCLOPramide (REGLAN) 10 MG tablet Take 10 mg by mouth daily as needed for nausea or vomiting (upset stomach).    [provider]  metoprolol succinate (TOPROL XL) 25 MG 24 hr tablet Take 1 tablet (25 mg total) by mouth daily. 10/24/21   Freada Bergeron, MD  nitroGLYCERIN (NITROSTAT) 0.4 MG SL tablet Place 1 tablet (0.4 mg total) under the tongue every 5 (five) minutes as needed for chest pain. 09/26/21 10/26/21  Luetta Nutting, DO  nystatin (MYCOSTATIN) 100000 UNIT/ML suspension Use as directed 5 mLs (500,000 Units total) in the mouth or throat 4 (four) times daily for 7 days. 05/21/21   Luetta Nutting, DO  omega-3 acid ethyl esters (LOVAZA) 1 g capsule Take by mouth daily.    [provider]  ondansetron (ZOFRAN-ODT) 4 MG disintegrating tablet Take 1 tablet (4 mg total) by mouth every 8 (eight) hours as needed for nausea or vomiting. 09/26/21   Luetta Nutting, DO  pantoprazole (PROTONIX) 40 MG tablet Take 1 tablet (40 mg total) by mouth daily. 08/29/21   Luetta Nutting, DO  prednisoLONE-lidocaine-nystatin-diphenhydrAMINE-alum & mag hydroxide-simeth Swish and spit 69ms 3 times daily as needed for mouth pain. 03/19/21   MLuetta Nutting DO  predniSONE (DELTASONE) 50 MG tablet Take 1 tablet (50 mg total) by mouth daily. 10/29/21   TSilverio Decamp MD  Semaglutide, 2 MG/DOSE, (OZEMPIC, 2 MG/DOSE,) 8 MG/3ML SOPN Inject 2 mg into the skin once a week. 05/29/21   ERenato Shin MD  tamsulosin (FLOMAX) 0.4 MG CAPS capsule Take 1 capsule (0.4 mg total) by mouth daily. 06/15/21   MLuetta Nutting DO  testosterone (ANDROGEL) 50 MG/5GM (1%) GEL Place 5 g (1 packet) onto the skin  daily. Apply to shoulders and upper arms 07/25/21 11/25/21  MLuetta Nutting DO  tirzepatide ([facility] 10 MG/0.5ML Pen Inject 10 mg into the skin once a week. 10/25/21   MLuetta Nutting DO  tiZANidine (ZANAFLEX) 4 MG tablet Take 2 tablets (8 mg total) by mouth 3 (three) times daily. 12/26/20   TSilverio Decamp MD  traMADol (ULTRAM) 50 MG tablet Take 2 tablets (100 mg total) by mouth 3 (three) times daily as needed. 06/01/21 11/28/21  TSilverio Decamp MD  traZODone (DESYREL) 50 MG tablet TAKE 1-2 TABLETS (50-100 MG TOTAL) BY MOUTH AT BEDTIME AS NEEDED FOR SLEEP. 06/01/21 06/01/22  MLuetta Nutting DO  Allergies    Kiwi extract, Other, Penicillins, Peach [prunus persica], Ancef [cefazolin], Latex, and Levaquin [levofloxacin]    Review of Systems   Review of Systems  Constitutional:  Positive for fatigue. Negative for appetite change, chills and fever.  HENT:  Negative for ear pain, rhinorrhea, sneezing and sore throat.   Eyes:  Negative for photophobia and visual disturbance.  Respiratory:  Positive for cough and shortness of breath. Negative for chest tightness and wheezing.   Cardiovascular:  Negative for chest pain and palpitations.  Gastrointestinal:  Negative for abdominal pain, blood in stool, constipation, diarrhea, nausea and vomiting.  Genitourinary:  Negative for dysuria, hematuria and urgency.  Musculoskeletal:  Negative for myalgias.  Skin:  Negative for rash.  Neurological:  Negative for dizziness, weakness and light-headedness.   Physical Exam Updated Vital Signs BP 101/62 (BP Location: Right Arm)    Pulse 84    Temp 97.8 F (36.6 C) (Oral)    Resp 16    SpO2 94%  Physical Exam Vitals and nursing note reviewed.  Constitutional:      General: He is not in acute distress.    Appearance: He is well-developed.     Comments: On 2 L of oxygen via nasal cannula  HENT:     Head: Normocephalic and atraumatic.     Nose: Nose normal.  Eyes:     General: No scleral icterus.        Right eye: No discharge.        Left eye: No discharge.     Conjunctiva/sclera: Conjunctivae normal.     Pupils: Pupils are equal, round, and reactive to light.  Cardiovascular:     Rate and Rhythm: Normal rate and regular rhythm.     Heart sounds: Normal heart sounds. No murmur heard.   No friction rub. No gallop.  Pulmonary:     Effort: Pulmonary effort is normal. No respiratory distress.     Breath sounds: Wheezing (RUL) present.  Abdominal:     General: Bowel sounds are normal. There is no distension.     Palpations: Abdomen is soft.     Tenderness: There is no abdominal tenderness. There is no guarding.  Musculoskeletal:        General: Normal range of motion.     Cervical back: Normal range of motion and neck supple.     Comments: No lower extremity edema, erythema or calf tenderness bilaterally.  Skin:    General: Skin is warm and dry.     Findings: No rash.  Neurological:     Mental Status: He is alert and oriented to person, place, and time.     Cranial Nerves: No cranial nerve deficit.     Sensory: No sensory deficit.     Motor: No abnormal muscle tone.     Coordination: Coordination normal.     Comments: Pupils reactive. No facial asymmetry noted. Cranial nerves appear grossly intact. Sensation intact to light touch on face, BUE and BLE. Strength 5/5 in BUE and BLE. Normal speech.    ED Results / Procedures / Treatments   Labs (all labs ordered are listed, but only abnormal results are displayed) Labs Reviewed  CBC WITH DIFFERENTIAL/PLATELET - Abnormal; Notable for the following components:      Result Value   WBC 17.8 (*)    RDW 19.4 (*)    Neutro Abs 16.5 (*)    Lymphs Abs 0.5 (*)    Abs Immature Granulocytes 0.12 (*)    All  other components within normal limits  BASIC METABOLIC PANEL - Abnormal; Notable for the following components:   Sodium 133 (*)    Glucose, Bld 224 (*)    Calcium 8.4 (*)    All other components within normal limits  CBG  MONITORING, ED - Abnormal; Notable for the following components:   Glucose-Capillary 216 (*)    All other components within normal limits  I-STAT VENOUS BLOOD GAS, ED - Abnormal; Notable for the following components:   pH, Ven 7.538 (*)    pCO2, Ven 31.9 (*)    pO2, Ven 137 (*)    Acid-Base Excess 5.0 (*)    Sodium 132 (*)    Potassium 3.4 (*)    Calcium, Ion 0.98 (*)    All other components within normal limits  RESP PANEL BY RT-PCR (FLU A&B, COVID) ARPGX2  TROPONIN I (HIGH SENSITIVITY)  TROPONIN I (HIGH SENSITIVITY)    EKG None  Radiology DG Chest 2 View  Result Date: 10/31/2021 CLINICAL DATA:  Shortness of breath EXAM: CHEST - 2 VIEW COMPARISON:  Chest radiograph dated October 29, 2021. FINDINGS: The heart size and mediastinal contours are within normal limits. Vascular crowding due to low lung volumes. Spinal stimulator is again noted. Degenerate disc disease of the thoracic spine. IMPRESSION: Vascular crowding due to low lung volumes. No definite evidence of pneumonia or pleural effusion. Electronically Signed   By: Keane Police D.O.   On: 10/31/2021 13:43    Procedures Procedures    Medications Ordered in ED Medications - No data to display  ED Course/ Medical Decision Making/ A&P Clinical Course as of 10/31/21 1840  Wed Oct 31, 2021  1556 WBC(!): 17.8 [HK]  1608 SARS Coronavirus 2 by RT PCR: NEGATIVE [HK]  1608 Influenza A By PCR: NEGATIVE [HK]  1608 Influenza B By PCR: NEGATIVE [HK]  1608 Creatinine: 0.84 [HK]  1608 CO2: 24 [HK]    Clinical Course User Index [HK] Delia Heady, PA-C                           Medical Decision Making Amount and/or Complexity of Data Reviewed Labs:  Decision-making details documented in ED Course. Radiology: ordered.   66 year old male presenting to the ED with a chief complaint of shortness of breath, cough, generalized weakness and fatigue.  He has not improved with antibiotic or steroid given by PCP recently.  Symptoms began 2  days ago and have not improved.  Patient arrives to the ER hypoxic and was placed on 2 L of oxygen by nasal cannula and does not wear oxygen at baseline.  He does have a cardiac history and is on Plavix, denies any missed doses.  On exam patient with some faint wheezing noted in the right upper lung field otherwise clear lung sounds. There is no documented hypoxia so I have asked nurse to take patient off of the supplemental oxygen and check oxygen saturations on room air.  His EKG shows normal sinus rhythm.  Troponin is pending.  Care and off to oncoming provider pending remainder of work-up including troponin, recheck oxygen saturation on room air as well as CT angio of the head and neck and CT angio of the chest to rule out a PE.    Portions of this note were generated with [employer]. Dictation errors may occur despite best attempts at proofreading.        Final Clinical Impression(s) / ED Diagnoses Final diagnoses:  Upper respiratory tract infection, unspecified type  Transient alteration of awareness    Rx / DC Orders ED Discharge Orders     None         Delia Heady, PA-C 10/31/21 [place], Ankit, MD 10/31/21 2357

## 2021-11-01 ENCOUNTER — Ambulatory Visit: Payer: No Typology Code available for payment source | Admitting: Cardiology

## 2021-11-01 ENCOUNTER — Observation Stay (HOSPITAL_COMMUNITY): Payer: No Typology Code available for payment source

## 2021-11-01 ENCOUNTER — Observation Stay (HOSPITAL_BASED_OUTPATIENT_CLINIC_OR_DEPARTMENT_OTHER): Payer: No Typology Code available for payment source

## 2021-11-01 DIAGNOSIS — G459 Transient cerebral ischemic attack, unspecified: Secondary | ICD-10-CM

## 2021-11-01 DIAGNOSIS — R29701 NIHSS score 1: Secondary | ICD-10-CM

## 2021-11-01 DIAGNOSIS — Z7982 Long term (current) use of aspirin: Secondary | ICD-10-CM

## 2021-11-01 DIAGNOSIS — R404 Transient alteration of awareness: Secondary | ICD-10-CM | POA: Diagnosis not present

## 2021-11-01 DIAGNOSIS — E119 Type 2 diabetes mellitus without complications: Secondary | ICD-10-CM

## 2021-11-01 DIAGNOSIS — W19XXXA Unspecified fall, initial encounter: Secondary | ICD-10-CM | POA: Diagnosis not present

## 2021-11-01 DIAGNOSIS — G9341 Metabolic encephalopathy: Secondary | ICD-10-CM

## 2021-11-01 DIAGNOSIS — J069 Acute upper respiratory infection, unspecified: Secondary | ICD-10-CM | POA: Diagnosis not present

## 2021-11-01 DIAGNOSIS — G894 Chronic pain syndrome: Secondary | ICD-10-CM | POA: Diagnosis not present

## 2021-11-01 DIAGNOSIS — R4182 Altered mental status, unspecified: Secondary | ICD-10-CM | POA: Diagnosis not present

## 2021-11-01 DIAGNOSIS — Z955 Presence of coronary angioplasty implant and graft: Secondary | ICD-10-CM | POA: Diagnosis not present

## 2021-11-01 DIAGNOSIS — Z7901 Long term (current) use of anticoagulants: Secondary | ICD-10-CM

## 2021-11-01 DIAGNOSIS — I10 Essential (primary) hypertension: Secondary | ICD-10-CM | POA: Diagnosis not present

## 2021-11-01 LAB — CBG MONITORING, ED: Glucose-Capillary: 88 mg/dL (ref 70–99)

## 2021-11-01 LAB — ECHOCARDIOGRAM COMPLETE
AR max vel: 3.84 cm2
AV Area VTI: 3.64 cm2
AV Area mean vel: 3.6 cm2
AV Mean grad: 3 mmHg
AV Peak grad: 5.7 mmHg
Ao pk vel: 1.19 m/s
Area-P 1/2: 2.26 cm2
MV VTI: 2.83 cm2
S' Lateral: 2.9 cm

## 2021-11-01 LAB — RESPIRATORY CULTURE OR RESPIRATORY AND SPUTUM CULTURE
MICRO NUMBER:: 13092549
SPECIMEN QUALITY:: ADEQUATE

## 2021-11-01 LAB — LIPID PANEL
Cholesterol: 81 mg/dL (ref 0–200)
HDL: 22 mg/dL — ABNORMAL LOW (ref >40)
LDL Cholesterol: 37 mg/dL (ref 0–99)
Total CHOL/HDL Ratio: 3.7 RATIO
Triglycerides: 110 mg/dL (ref <150)
VLDL: 22 mg/dL (ref 0–40)

## 2021-11-01 LAB — GLUCOSE, CAPILLARY
Glucose-Capillary: 113 mg/dL — ABNORMAL HIGH (ref 70–99)
Glucose-Capillary: 131 mg/dL — ABNORMAL HIGH (ref 70–99)
Glucose-Capillary: 158 mg/dL — ABNORMAL HIGH (ref 70–99)

## 2021-11-01 LAB — HEMOGLOBIN A1C
Hgb A1c MFr Bld: 6.3 % — ABNORMAL HIGH (ref 4.8–5.6)
Mean Plasma Glucose: 134.11 mg/dL

## 2021-11-01 MED ORDER — BUPROPION HCL ER (XL) 150 MG PO TB24
300.0000 mg | ORAL_TABLET | Freq: Every day | ORAL | Status: DC
  Administered 2021-11-01 – 2021-11-02 (×2): 300 mg via ORAL
  Filled 2021-11-01: qty 1
  Filled 2021-11-01: qty 2

## 2021-11-01 MED ORDER — ASPIRIN 325 MG PO TABS
325.0000 mg | ORAL_TABLET | Freq: Every day | ORAL | Status: DC
  Administered 2021-11-01 – 2021-11-02 (×2): 325 mg via ORAL
  Filled 2021-11-01 (×2): qty 1

## 2021-11-01 MED ORDER — PANTOPRAZOLE SODIUM 40 MG PO TBEC
40.0000 mg | DELAYED_RELEASE_TABLET | Freq: Every day | ORAL | Status: DC
  Administered 2021-11-01 – 2021-11-02 (×2): 40 mg via ORAL
  Filled 2021-11-01 (×2): qty 1

## 2021-11-01 MED ORDER — STROKE: EARLY STAGES OF RECOVERY BOOK: Freq: Once | Status: DC

## 2021-11-01 MED ORDER — BUPRENORPHINE 10 MCG/HR TD PTWK
2.0000 | MEDICATED_PATCH | TRANSDERMAL | Status: DC
  Administered 2021-11-02: 2 via TRANSDERMAL
  Filled 2021-11-01: qty 2

## 2021-11-01 MED ORDER — ISOSORBIDE MONONITRATE ER 30 MG PO TB24
30.0000 mg | ORAL_TABLET | Freq: Every day | ORAL | Status: DC
  Administered 2021-11-01 – 2021-11-02 (×2): 30 mg via ORAL
  Filled 2021-11-01 (×2): qty 1

## 2021-11-01 MED ORDER — ARIPIPRAZOLE 10 MG PO TABS
10.0000 mg | ORAL_TABLET | Freq: Every morning | ORAL | Status: DC
  Administered 2021-11-01 – 2021-11-02 (×2): 10 mg via ORAL
  Filled 2021-11-01 (×2): qty 1

## 2021-11-01 MED ORDER — ALBUTEROL SULFATE (2.5 MG/3ML) 0.083% IN NEBU: 3.0000 mL | INHALATION_SOLUTION | Freq: Four times a day (QID) | RESPIRATORY_TRACT | Status: DC | PRN

## 2021-11-01 MED ORDER — CLOPIDOGREL BISULFATE 75 MG PO TABS
75.0000 mg | ORAL_TABLET | Freq: Every day | ORAL | Status: DC
Start: 2021-11-01 — End: 2021-11-02
  Administered 2021-11-01 – 2021-11-02 (×2): 75 mg via ORAL
  Filled 2021-11-01 (×2): qty 1

## 2021-11-01 MED ORDER — METOPROLOL SUCCINATE ER 25 MG PO TB24
25.0000 mg | ORAL_TABLET | Freq: Every day | ORAL | Status: DC
  Administered 2021-11-01 – 2021-11-02 (×2): 25 mg via ORAL
  Filled 2021-11-01 (×2): qty 1

## 2021-11-01 MED ORDER — ENOXAPARIN SODIUM 40 MG/0.4ML IJ SOSY
40.0000 mg | PREFILLED_SYRINGE | Freq: Every day | INTRAMUSCULAR | Status: DC
  Administered 2021-11-01 – 2021-11-02 (×2): 40 mg via SUBCUTANEOUS
  Filled 2021-11-01 (×2): qty 0.4

## 2021-11-01 MED ORDER — ATORVASTATIN CALCIUM 80 MG PO TABS
80.0000 mg | ORAL_TABLET | Freq: Every day | ORAL | Status: DC
  Administered 2021-11-01 – 2021-11-02 (×2): 80 mg via ORAL
  Filled 2021-11-01 (×2): qty 1

## 2021-11-01 MED ORDER — FERROUS SULFATE 325 (65 FE) MG PO TABS
325.0000 mg | ORAL_TABLET | Freq: Every day | ORAL | Status: DC
  Administered 2021-11-01 – 2021-11-02 (×2): 325 mg via ORAL
  Filled 2021-11-01 (×2): qty 1

## 2021-11-01 MED ORDER — INSULIN ASPART 100 UNIT/ML IJ SOLN
0.0000 [IU] | Freq: Three times a day (TID) | INTRAMUSCULAR | Status: DC
  Administered 2021-11-01: 2 [IU] via SUBCUTANEOUS
  Administered 2021-11-01: 1 [IU] via SUBCUTANEOUS

## 2021-11-01 MED ORDER — TAMSULOSIN HCL 0.4 MG PO CAPS
0.4000 mg | ORAL_CAPSULE | Freq: Every day | ORAL | Status: DC
  Administered 2021-11-01 – 2021-11-02 (×2): 0.4 mg via ORAL
  Filled 2021-11-01 (×2): qty 1

## 2021-11-01 NOTE — Assessment & Plan Note (Signed)
-  Continue aspirin, Plavix, lipitor, Imdur  ?

## 2021-11-01 NOTE — Assessment & Plan Note (Addendum)
-  continue IV Rocephin and Azithromcyin  ?-currently on room air ?CT angiogram showed consolidation bilaterally more on the right.  Chest x-ray did not show pneumonia or effusion ?

## 2021-11-01 NOTE — Consult Note (Signed)
Neurology Consult H&P  Brandon [name] MR# [id number] 11/01/2021   CC: TIA  History is obtained from: Patient and chart.  HPI: Brandon [name] is a 66 y.o. male PMHx as reviewed below became progressively confused developed word finding difficulty and worsening of baseline left lower extremity weakness.  About 10 days ago he developed upper respiratory symptoms was seen by PCP treated for bronchitis.  He was started on prednisone and azithromycin inhaler became lethargic with difficulty finding words and went to sleep.  The following day he woke late morning and felt as though he could not complete his thoughts and his left leg felt heavy and presented to ED  He has significant CAD s/p multiple cardiac stents and is on aspirin and Plavix recently seen by cardiology October 24, 2021.  LKW: Unclear tNK given: No OS W IR Thrombectomy No, not indicated Modified Rankin Scale: 0-Completely asymptomatic and back to baseline post- stroke NIHSS: 1 with a lot of encouragement.  ROS: A complete ROS was performed and is negative except as noted in the HPI.   Past Medical History:  Diagnosis Date   Anxiety    Basal cell carcinoma (BCC) in situ of skin    Colon polyps    Coronary artery disease    S/p DES to mRCA in 12/21 // S/p DES to LCx x 2 in 12/2020 // Diff dz in small to mod Dx (not a good target for PCI); OM1 100 CTO; severe dz in small OM2 and PDA >> Med Rx   Depression    Diabetes mellitus without complication (HCC)    HLD (hyperlipidemia)    Hypertension    SBO (small bowel obstruction) (HCC)    Family History  Problem Relation Age of Onset   COPD Mother    Anxiety disorder Mother    Depression Mother    Cancer Father    Anxiety disorder Father    Depression Father    Other Father    Diabetes Neg Hx   Social History:  reports that he quit smoking about 16 years ago. His smoking use included cigarettes. He has never used smokeless tobacco. He reports that he does not currently use  alcohol. He reports that he does not currently use drugs.  Prior to Admission medications   Medication Sig Start Date End Date Taking? Authorizing Provider  albuterol (VENTOLIN HFA) 108 (90 Base) MCG/ACT inhaler Inhale 2 puffs by mouth  into the lungs every 6 (six) hours as needed for wheezing. 10/29/21  Yes Monica Becton, MD  ARIPiprazole (ABILIFY) 10 MG tablet Take 10 mg by mouth every morning.   Yes [provider]  aspirin 81 MG EC tablet Take 81 mg by mouth every morning.   Yes [provider]  atorvastatin (LIPITOR) 80 MG tablet TAKE 1 TABLET (80 MG TOTAL) BY MOUTH DAILY. Patient taking differently: Take 80 mg by mouth daily. 07/17/21 07/17/22 Yes Pemberton, Kathlynn Grate, MD  azithromycin (ZITHROMAX Z-PAK) 250 MG tablet Take 2 tablets (500 mg) by mouth on  Day 1,  followed by 1 tablet (250 mg) once daily on Days 2 through 5. 10/29/21  Yes Monica Becton, MD  b complex vitamins capsule Take 1 capsule by mouth daily.   Yes [provider]  buprenorphine (BUTRANS) 20 MCG/HR PTWK Place 1 patch onto the skin once a week. 10/11/21 01/31/22 Yes Edward Jolly, MD  buPROPion (WELLBUTRIN XL) 150 MG 24 hr tablet Take 1 tablet (150 mg total) by mouth daily.  08/21/21  Yes Everrett Coombe, DO  buPROPion (WELLBUTRIN XL) 300 MG 24 hr tablet Take 1 tablet (300 mg total) by mouth daily. 08/21/21  Yes Everrett Coombe, DO  clopidogrel (PLAVIX) 75 MG tablet TAKE 1 TABLET (75 MG TOTAL) BY MOUTH DAILY WITH BREAKFAST. Patient taking differently: Take 75 mg by mouth daily with breakfast. 07/17/21 07/17/22 Yes Pemberton, Kathlynn Grate, MD  empagliflozin (JARDIANCE) 25 MG TABS tablet Take 1 tablet (25 mg total) by mouth daily. 08/01/21  Yes Everrett Coombe, DO  Insulin Asp Prot & Asp FlexPen (NOVOLOG 70/30 MIX) (70-30) 100 UNIT/ML FlexPen Inject 24 Units into the skin 3 (three) times daily as needed (has dexcom, checks before dose). 06/14/21  Yes [provider]  Iron, Ferrous  Sulfate, 325 (65 Fe) MG TABS Take 325 mg by mouth daily. 10/24/21  Yes Everrett Coombe, DO  isosorbide mononitrate (IMDUR) 30 MG 24 hr tablet Take 1 tablet (30 mg total) by mouth daily. 07/17/21  Yes Meriam Sprague, MD  lidocaine (LIDODERM) 5 % Apply 1 - 3 patches on to the skin as directed. 12 hours on and 12 hours off. 08/10/21  Yes   metoCLOPramide (REGLAN) 10 MG tablet Take 10 mg by mouth daily as needed for nausea or vomiting (upset stomach).   Yes [provider]  metoprolol succinate (TOPROL XL) 25 MG 24 hr tablet Take 1 tablet (25 mg total) by mouth daily. 10/24/21  Yes Meriam Sprague, MD  nitroGLYCERIN (NITROSTAT) 0.4 MG SL tablet Place 1 tablet (0.4 mg total) under the tongue every 5 (five) minutes as needed for chest pain. 09/26/21 10/31/21 Yes Matthews, Cody, DO  ondansetron (ZOFRAN-ODT) 4 MG disintegrating tablet Take 1 tablet (4 mg total) by mouth every 8 (eight) hours as needed for nausea or vomiting. 09/26/21  Yes Everrett Coombe, DO  pantoprazole (PROTONIX) 40 MG tablet Take 1 tablet (40 mg total) by mouth daily. 08/29/21  Yes Everrett Coombe, DO  predniSONE (DELTASONE) 50 MG tablet Take 1 tablet (50 mg total) by mouth daily. 10/29/21  Yes Monica Becton, MD  Semaglutide, 2 MG/DOSE, (OZEMPIC, 2 MG/DOSE,) 8 MG/3ML SOPN Inject 2 mg into the skin once a week. 05/29/21  Yes Romero Belling, MD  tamsulosin (FLOMAX) 0.4 MG CAPS capsule Take 1 capsule (0.4 mg total) by mouth daily. 06/15/21  Yes Everrett Coombe, DO  testosterone (ANDROGEL) 50 MG/5GM (1%) GEL Place 5 g (1 packet) onto the skin daily. Apply to shoulders and upper arms 07/25/21 11/25/21 Yes Everrett Coombe, DO  tiZANidine (ZANAFLEX) 4 MG tablet Take 2 tablets (8 mg total) by mouth 3 (three) times daily. 12/26/20  Yes Monica Becton, MD  amLODipine (NORVASC) 10 MG tablet Take 1/2 tablet (5 mg total) by mouth daily. Patient not taking: Reported on 10/31/2021 07/17/21   Meriam Sprague, MD  Continuous Blood Gluc  Sensor (DEXCOM G6 SENSOR) MISC Use as directed. Change sensor every 10 days 11/25/20   Romero Belling, MD  COVID-19 At Home Antigen Test [facility] COVID-19 HOME TEST) KIT Use as directed 10/29/21   Gwenlyn Fudge, RPH  glucose blood test strip USE 1 TO CHECK BLOOD GLUCOSE UP TO 5 TIMES DAILY. 10/06/20 11/18/21  Everrett Coombe, DO  insulin lispro (HUMALOG) 100 UNIT/ML injection For use in pump, total of 60 units per day Patient not taking: Reported on 10/31/2021 10/19/21   Romero Belling, MD  losartan (COZAAR) 25 MG tablet Take 1 tablet (25 mg total) by mouth daily. Take only when systolic BP greater  than 110. Patient not taking: Reported on 10/31/2021 12/04/20   Meriam Sprague, MD  nystatin (MYCOSTATIN) 100000 UNIT/ML suspension Use as directed 5 mLs (500,000 Units total) in the mouth or throat 4 (four) times daily for 7 days. Patient not taking: Reported on 10/31/2021 05/21/21   Everrett Coombe, DO  omega-3 acid ethyl esters (LOVAZA) 1 g capsule Take by mouth daily. Patient not taking: Reported on 10/31/2021    [provider]  prednisoLONE-lidocaine-nystatin-diphenhydrAMINE-alum & mag hydroxide-simeth Swish and spit 3 times daily as needed for mouth pain. Patient not taking: Reported on 10/31/2021 03/19/21   Everrett Coombe, DO  tirzepatide [facility]) 10 MG/0.5ML Pen Inject 10 mg into the skin once a week. Patient not taking: Reported on 10/31/2021 10/25/21   Everrett Coombe, DO  traMADol (ULTRAM) 50 MG tablet Take 2 tablets (100 mg total) by mouth 3 (three) times daily as needed. Patient not taking: Reported on 10/31/2021 06/01/21 11/28/21  Monica Becton, MD  traZODone (DESYREL) 50 MG tablet TAKE 1-2 TABLETS (50-100 MG TOTAL) BY MOUTH AT BEDTIME AS NEEDED FOR SLEEP. Patient not taking: Reported on 10/31/2021 06/01/21 06/01/22  Everrett Coombe, DO  Exam: Current vital signs: BP 139/84   Pulse 67   Temp 97.8 F (36.6 C) (Oral)   Resp 16   SpO2 96%  Physical Exam  Constitutional: Appears  well-developed and well-nourished.  Psych: Affect appropriate to situation Eyes: No scleral injection HENT: No OP obstruction. Head: Normocephalic.  Cardiovascular: Normal rate and regular rhythm.  Respiratory: Effort normal, symmetric excursions bilaterally, no audible wheezing. GI: Soft.  No distension. There is no tenderness.  Skin: WDI  Neuro: Mental Status: Patient is awake, alert, oriented to person, place, month, year, and situation. Patient is able to give a clear and coherent history. Speech fluent, intact comprehension and repetition. No signs of aphasia or neglect. Visual Fields are full. Pupils are equal, round, and reactive to light. EOMI without ptosis or diplopia.  Facial sensation is symmetric to temperature Facial movement is symmetric.  Hearing is intact to voice. Uvula midline and palate elevates symmetrically. Shoulder shrug is symmetric. Tongue is midline without atrophy or fasciculations.  Tone is normal. Bulk is normal.  Left lower extremity 4/5 strength right lower extremity 4/5 strength (chronic). Sensation is symmetric to light touch and temperature in the arms and legs. Deep Tendon Reflexes: Symmetrically brisk lower extremities. FNF and HKS are intact bilaterally. Gait - Deferred  I have reviewed labs in epic and the pertinent results are: A1c 6.3 LDL 37  I have reviewed the images obtained: NCT head showed no acute ischemic changes, hemorrhage, mass. CTA head and neck showed no emergent large vessel occlusion, moderate right P3 segment narrowing.  Assessment: Brandon [name] is a 66 y.o. male PMHx as noted above, significant vascular risk factors (CAD s/p multiple stents, DM2, HLD, HTN) with transient encephalopathy and new left lower extremity weakness suggesting TIA/stroke.  Though transient encephalopathy may be secondary to CAP, she does have significant vascular risk factors and will need further stroke evaluation.  He fell PTA and his left  lower extremity is in pain. Due to having hardware L4-5 and in setting of fall xray lumbar spine.  Patient has severe anxiety and states he cannot have MRI without heavy sedation which may not work.  Impression TIA/stroke NIHSS 1 Transient encephalopathy CAD s/p stents On aspirin On clopidogrel CAP HTN DM 2 HLD   Plan: - Repeat CT head within 24 hours. - Recommend TTE. -  Continue statin LDL at goal <70. - A1c at goal <7. - Aspirin 325mg  daily. - Clopidogrel 75mg  daily as per cardiology. - SBP goal <130. - Telemetry monitoring for arrhythmia. - Recommend bedside Swallow screen. - Recommend Stroke education. - Recommend PT/OT/SLP consult.  Electronically signed by:  Marisue Humble, MD Page: [phone] 11/01/2021, 3:44 AM  If 7pm- 7am, please page neurology on call as listed in AMION.

## 2021-11-01 NOTE — Progress Notes (Signed)
PROGRESS NOTE  BOW ADER  PIR:[id number] DOB: 1956/02/07 DOA: 10/31/2021 PCP: Everrett Coombe, DO   Brief Narrative: Brandon [name] is a 66 y.o. 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  Patient was recently treated for upper respite tract infection by her PCP.  On presentation, lab work showed leukocytosis.  CTA of the chest showed patchy consolidation bilaterally more on the right.  Started antibiotics.  Neurology also following for the concern of TIA.  Assessment & Plan:  Principal Problem:   AMS (altered mental status) Active Problems:   Community acquired pneumonia   Primary hypertension   Coronary artery disease   Status post coronary artery stent placement   Chronic pain syndrome   Type 2 diabetes mellitus (HCC)   Assessment and Plan: * AMS (altered mental status) AMS/Trouble word finding/worsening left LE weakness -acute metabolic encephalopathy vs  suspectedTIA. 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  -CTA head and neck negative for any intracranial large vessel occlusion but had focal moderate narrowing in the proximal right medial P3 segment. Discussed with Dr. Thomasena Edis Neurology and  recommended increase aspirin to 325mg  daily.  -pt has severe anxiety to MRI and requires sedation.MRI not done  -Echo pending -A1c of 6.3, LDL of 37 -PT/OT/SLP pending -Plan to repeat head today  Community acquired pneumonia -continue IV Rocephin and Azithromcyin  -currently on room air CT angiogram showed consolidation bilaterally more on the right.  Chest x-ray did not show pneumonia or effusion  Coronary artery disease pt recently has had episodes of hyptotension and was recently taken off amlodipine and had decrease of  metoprolol continue metoprolol  Primary hypertension Continue current medications.  Monitor blood pressure  Type 2 diabetes mellitus (HCC) Continue sliding scale insulin for now.  Monitor blood sugars.  Hemoglobin A1c of 6.3.  Chronic pain syndrome Continue home [place]  Status post coronary artery stent placement -Continue aspirin, Plavix, lipitor, Imdur            DVT prophylaxis:enoxaparin (LOVENOX) injection 40 mg Start: 11/01/21 1000     Code Status: Full Code  Family Communication: None at bedside  Patient status:Inpatient  Patient is from :Home  Anticipated discharge YT:KZSW  Estimated DC date:tomorrow.  Pending stroke work-up   Consultants: Neurology  Procedures: None  Antimicrobials:  Anti-infectives (From admission, onward)    Start     Dose/Rate Route Frequency Ordered Stop   10/31/21 2000  cefTRIAXone (ROCEPHIN) 2 g in sodium chloride 0.9 % 100 mL IVPB        2 g 200 mL/hr over 30 Minutes Intravenous Every 24 hours 10/31/21 1948 11/05/21 1944   10/31/21 2000  azithromycin (ZITHROMAX) 500 mg in sodium chloride 0.9 % 250 mL IVPB        500 mg 250 mL/hr over 60 Minutes Intravenous Every 24 hours 10/31/21 1948 11/05/21 1944       Subjective: Patient seen and examined at the bedside this morning.  Looks comfortable this morning.  Alert and oriented.  Complains of weakness in bilateral lower extremities but says he is improving.  On room air  Objective: Vitals:   11/01/21 1009 11/01/21 1030 11/01/21 1100 11/01/21 1201  BP: 122/61 127/60 111/63 121/60  Pulse: 72 87 75 67  Resp:  17  18 18   Temp:    97.9 F (36.6 C)  TempSrc:    Oral  SpO2: 94% 94% 93% 97%    Intake/Output Summary (Last 24 hours) at 11/01/2021 1314 Last data filed at 10/31/2021 2200 Gross per 24 hour  Intake --  Output 650 ml  Net -650 ml   There were no vitals filed for this visit.  Examination:  General exam: Overall comfortable, not in distress HEENT:  PERRL Respiratory system:  no wheezes or crackles , coarse breathing sounds Cardiovascular system: S1 & S2 heard, RRR.  Gastrointestinal system: Abdomen is nondistended, soft and nontender. Central nervous system: Alert and oriented, mild weakness of bilateral lower extremities but near normal motor function Extremities: No edema, no clubbing ,no cyanosis Skin: No rashes, no ulcers,no icterus     Data Reviewed: I have personally reviewed following labs and imaging studies  CBC: Recent Labs  Lab 10/31/21 1317 10/31/21 1343  WBC 17.8*  --   NEUTROABS 16.5*  --   HGB 13.3 13.9  HCT 41.3 41.0  MCV 84.1  --   PLT 248  --    Basic Metabolic Panel: Recent Labs  Lab 10/31/21 1317 10/31/21 1343  NA 133* 132*  K 3.6 3.4*  CL 98  --   CO2 24  --   GLUCOSE 224*  --   BUN 12  --   CREATININE 0.84  --   CALCIUM 8.4*  --      Recent Results (from the past 240 hour(s))  Resp Panel by RT-PCR (Flu A&B, Covid) Nasopharyngeal Swab     Status: None   Collection Time: 10/22/21  4:30 PM   Specimen: Nasopharyngeal Swab; Nasopharyngeal(NP) swabs in vial transport medium  Result Value Ref Range Status   SARS Coronavirus 2 by RT PCR NEGATIVE NEGATIVE Final    Comment: (NOTE) SARS-CoV-2 target nucleic acids are NOT DETECTED.  The SARS-CoV-2 RNA is generally detectable in upper respiratory specimens during the acute phase of infection. The lowest concentration of SARS-CoV-2 viral copies this assay can detect is 138 copies/mL. A negative result does not preclude SARS-Cov-2 infection and should not be used as the sole basis for treatment or other patient management decisions. A negative result may occur with  improper specimen collection/handling, submission of specimen other than nasopharyngeal swab, presence of viral mutation(s) within the areas targeted by this assay, and inadequate number of viral copies(<138 copies/mL). A negative result must be combined with clinical observations,  patient history, and epidemiological information. The expected result is Negative.  Fact Sheet for Patients:  BloggerCourse.com  Fact Sheet for Healthcare Providers:  SeriousBroker.it  This test is no t yet approved or cleared by the [place] FDA and  has been authorized for detection and/or diagnosis of SARS-CoV-2 by FDA under an Emergency Use Authorization (EUA). This EUA will remain  in effect (meaning this test can be used) for the duration of the COVID-19 declaration under Section 564(b)(1) of the Act, 21 U.S.C.section 360bbb-3(b)(1), unless the authorization is terminated  or revoked sooner.       Influenza A by PCR NEGATIVE NEGATIVE Final   Influenza B by PCR NEGATIVE NEGATIVE Final    Comment: (NOTE) The Xpert Xpress SARS-CoV-2/FLU/RSV plus assay is intended as an aid in the diagnosis of influenza from Nasopharyngeal swab specimens and should not be used as a sole basis for treatment. Nasal washings and aspirates are unacceptable for Xpert Xpress SARS-CoV-2/FLU/RSV testing.  Fact Sheet for Patients: BloggerCourse.com  Fact  Sheet for Healthcare Providers: SeriousBroker.it  This test is not yet approved or cleared by the [place] and has been authorized for detection and/or diagnosis of SARS-CoV-2 by FDA under an Emergency Use Authorization (EUA). This EUA will remain in effect (meaning this test can be used) for the duration of the COVID-19 declaration under Section 564(b)(1) of the Act, 21 U.S.C. section 360bbb-3(b)(1), unless the authorization is terminated or revoked.  Performed at [facility], [address] [place], [place] [address]   Respiratory or Resp and Sputum Culture     Status: Abnormal   Collection Time: 10/29/21 12:00 AM  Result Value Ref Range Status   MICRO NUMBER: [id number]  Final   SPECIMEN QUALITY: Adequate  Final    Source SPUTUM  Final   STATUS: FINAL  Final   GRAM STAIN: (A)  Final    Many White blood cells seen Few epithelial cells Few Gram positive cocci in clusters Few Gram positive bacilli   ISOLATE 1: Staphylococcus aureus (A)  Final    Comment: Heavy growth of Staphylococcus aureus      Susceptibility   Staphylococcus aureus - RESPIRATORY/SPUTUM CULT POSITIVE 1    VANCOMYCIN <=0.5 Sensitive     CIPROFLOXACIN >=8 Resistant     CLINDAMYCIN <=0.25 Sensitive     LEVOFLOXACIN 4 Intermediate     ERYTHROMYCIN <=0.25 Sensitive     GENTAMICIN <=0.5 Sensitive     OXACILLIN* <=0.25 Sensitive      * Oxacillin susceptible staphylococci are susceptible to other penicillinase-stable penicillins (e.g., methicillin, nafcillin), beta- lactam/beta-lactamase inhibitor combinations, and cephems with staphylococcal indications, including cefazolin.     TETRACYCLINE <=1 Sensitive     TRIMETH/SULFA* <=10 Sensitive      * Oxacillin susceptible staphylococci are susceptible to other penicillinase-stable penicillins (e.g., methicillin, nafcillin), beta- lactam/beta-lactamase inhibitor combinations, and cephems with staphylococcal indications, including cefazolin. Legend: S = Susceptible  I = Intermediate R = Resistant  NS = Not susceptible * = Not tested  NR = Not reported **NN = See antimicrobic comments   Resp Panel by RT-PCR (Flu A&B, Covid) Nasopharyngeal Swab     Status: None   Collection Time: 10/31/21  1:09 PM   Specimen: Nasopharyngeal Swab; Nasopharyngeal(NP) swabs in vial transport medium  Result Value Ref Range Status   SARS Coronavirus 2 by RT PCR NEGATIVE NEGATIVE Final    Comment: (NOTE) SARS-CoV-2 target nucleic acids are NOT DETECTED.  The SARS-CoV-2 RNA is generally detectable in upper respiratory specimens during the acute phase of infection. The lowest concentration of SARS-CoV-2 viral copies this assay can detect is 138 copies/mL. A negative result does not preclude  SARS-Cov-2 infection and should not be used as the sole basis for treatment or other patient management decisions. A negative result may occur with  improper specimen collection/handling, submission of specimen other than nasopharyngeal swab, presence of viral mutation(s) within the areas targeted by this assay, and inadequate number of viral copies(<138 copies/mL). A negative result must be combined with clinical observations, patient history, and epidemiological information. The expected result is Negative.  Fact Sheet for Patients:  BloggerCourse.com  Fact Sheet for Healthcare Providers:  SeriousBroker.it  This test is no t yet approved or cleared by the [place] FDA and  has been authorized for detection and/or diagnosis of SARS-CoV-2 by FDA under an Emergency Use Authorization (EUA). This EUA will remain  in effect (meaning this test can be used) for the duration of the COVID-19 declaration under Section 564(b)(1)  of the Act, 21 U.S.C.section 360bbb-3(b)(1), unless the authorization is terminated  or revoked sooner.       Influenza A by PCR NEGATIVE NEGATIVE Final   Influenza B by PCR NEGATIVE NEGATIVE Final    Comment: (NOTE) The Xpert Xpress SARS-CoV-2/FLU/RSV plus assay is intended as an aid in the diagnosis of influenza from Nasopharyngeal swab specimens and should not be used as a sole basis for treatment. Nasal washings and aspirates are unacceptable for Xpert Xpress SARS-CoV-2/FLU/RSV testing.  Fact Sheet for Patients: BloggerCourse.com  Fact Sheet for Healthcare Providers: SeriousBroker.it  This test is not yet approved or cleared by the [place] FDA and has been authorized for detection and/or diagnosis of SARS-CoV-2 by FDA under an Emergency Use Authorization (EUA). This EUA will remain in effect (meaning this test can be used) for the duration of  the COVID-19 declaration under Section 564(b)(1) of the Act, 21 U.S.C. section 360bbb-3(b)(1), unless the authorization is terminated or revoked.  Performed at [facility], [address], [place], [place] [address]   Blood Culture (routine x 2)     Status: None (Preliminary result)   Collection Time: 10/31/21  7:33 PM   Specimen: BLOOD  Result Value Ref Range Status   Specimen Description BLOOD SITE NOT SPECIFIED  Final   Special Requests   Final    BOTTLES DRAWN AEROBIC AND ANAEROBIC Blood Culture results may not be optimal due to an inadequate volume of blood received in culture bottles   Culture   Final    NO GROWTH < 24 HOURS Performed at [facility], [address] [place], [place] [address]    Report Status PENDING  Incomplete  Blood Culture (routine x 2)     Status: None (Preliminary result)   Collection Time: 10/31/21  7:38 PM   Specimen: BLOOD  Result Value Ref Range Status   Specimen Description BLOOD LEFT ANTECUBITAL  Final   Special Requests   Final    BOTTLES DRAWN AEROBIC AND ANAEROBIC Blood Culture results may not be optimal due to an inadequate volume of blood received in culture bottles   Culture   Final    NO GROWTH < 24 HOURS Performed at [facility], [address], [place], [place] [address]    Report Status PENDING  Incomplete     Radiology Studies: CT ANGIO HEAD NECK W WO CM  Result Date: 10/31/2021 CLINICAL DATA:  TIA EXAM: CT ANGIOGRAPHY HEAD AND NECK TECHNIQUE: Multidetector CT imaging of the head and neck was performed using the standard protocol during bolus administration of intravenous contrast. Multiplanar CT image reconstructions and MIPs were obtained to evaluate the vascular anatomy. Carotid stenosis measurements (when applicable) are obtained utilizing NASCET criteria, using the distal internal carotid diameter as the denominator. RADIATION DOSE REDUCTION: This exam was performed according to the departmental  dose-optimization program which includes automated exposure control, adjustment of the mA and/or kV according to patient size and/or use of iterative reconstruction technique. CONTRAST:  OMNIPAQUE IOHEXOL 350 MG/ML SOLN COMPARISON:  None. FINDINGS: CT HEAD FINDINGS Brain: No evidence of acute infarction, hemorrhage, cerebral edema, mass, mass effect, or midline shift. No hydrocephalus or extra-axial fluid collection. Mildly advanced atrophy for age, particularly in the parietal lobes, with widening of the central sulcus and parieto-occipital sulcus. Vascular: No hyperdense vessel. Skull: Normal. Negative for fracture or focal lesion. Sinuses/Orbits: Fluid in the left sphenoid sinus. The sinuses are otherwise unremarkable. Status post bilateral lens replacements. Other:  The mastoid air cells are well aerated. CTA NECK FINDINGS Aortic arch: Standard branching. Imaged portion shows no evidence of aneurysm or dissection. No significant stenosis of the major arch vessel origins. Right carotid system: No evidence of dissection, stenosis (50% or greater) or occlusion. Left carotid system: No evidence of dissection, stenosis (50% or greater) or occlusion. Vertebral arteries: Codominant. No evidence of dissection, stenosis (50% or greater) or occlusion. Skeleton: Degenerative changes in the cervical spine. No acute osseous abnormality. Other neck: None. Upper chest: No focal pulmonary opacity or pleural effusion. Review of the MIP images confirms the above findings CTA HEAD FINDINGS Anterior circulation: Both internal carotid arteries are patent to the termini, with moderate calcifications that cause mild stenosis, right-greater-than-left. A1 segments patent, with mild narrowing of the mid to distal right A1. Normal anterior communicating artery. Focal calcification in the proximal left A2 segment. Anterior cerebral arteries are somewhat irregular but patent to their distal aspects. No M1 stenosis or occlusion. Normal  MCA bifurcations. Distal MCA branches somewhat irregular but perfused to their distal aspects. Posterior circulation: Vertebral arteries patent to the vertebrobasilar junction without stenosis. Posterior inferior cerebral arteries patent bilaterally. Basilar patent to its distal aspect. Superior cerebellar arteries patent bilaterally. Patent P1 segments. Moderate narrowing in the proximal right medial P3 segment (series 15, image [id number]). PCAs otherwise perfused to their distal aspects without stenosis. The bilateral posterior communicating arteries are not visualized. Venous sinuses: As permitted by contrast timing, patent. Anatomic variants: None significant. Review of the MIP images confirms the above findings IMPRESSION: 1.  No acute intracranial process. 2. No intracranial large vessel occlusion or significant stenosis. Multifocal mild irregularity in the intracranial vasculature, with more focal moderate narrowing in the proximal right medial P3 segment. 3.  No hemodynamically significant stenosis in the neck. 4. Fluid in left sphenoid sinus, which can be seen in the setting of acute sinusitis. Correlate with symptoms. Electronically Signed   By: Wiliam Ke M.D.   On: 10/31/2021 19:26   DG Chest 2 View  Result Date: 10/31/2021 CLINICAL DATA:  Shortness of breath EXAM: CHEST - 2 VIEW COMPARISON:  Chest radiograph dated October 29, 2021. FINDINGS: The heart size and mediastinal contours are within normal limits. Vascular crowding due to low lung volumes. Spinal stimulator is again noted. Degenerate disc disease of the thoracic spine. IMPRESSION: Vascular crowding due to low lung volumes. No definite evidence of pneumonia or pleural effusion. Electronically Signed   By: Larose Hires D.O.   On: 10/31/2021 13:43   DG Lumbar Spine 2-3 Views  Result Date: 11/01/2021 CLINICAL DATA:  Fall yesterday, left leg weakness and numbness, back pain. History of L4-L5 fusion in 2006 EXAM: LUMBAR SPINE - 2-3 VIEW  COMPARISON:  Lumbar spine radiographs 07/26/2020 FINDINGS: Postsurgical changes reflecting L4-L5 posterior instrumented fusion are noted. The stimulator power pack partially obscures the hardware on the lateral projections, but hardware alignment is grossly within expected limits, without definite evidence of complication. Vertebral body heights are preserved, without evidence of acute fracture. Alignment is normal. There is multilevel degenerative endplate change, most notable at L2-L3. There is mild multilevel disc space narrowing at the nonsurgical levels. IMPRESSION: 1. No evidence of acute injury in the lumbar spine. 2. Status post L4-L5 posterior instrumented fusion without definite evidence of complication. Electronically Signed   By: Lesia Hausen M.D.   On: 11/01/2021 08:46   CT Angio Chest PE W/Cm &/Or Wo Cm  Result Date: 10/31/2021 CLINICAL DATA:  Shortness of breath and cough  EXAM: CT ANGIOGRAPHY CHEST WITH CONTRAST TECHNIQUE: Multidetector CT imaging of the chest was performed using the standard protocol during bolus administration of intravenous contrast. Multiplanar CT image reconstructions and MIPs were obtained to evaluate the vascular anatomy. RADIATION DOSE REDUCTION: This exam was performed according to the departmental dose-optimization program which includes automated exposure control, adjustment of the mA and/or kV according to patient size and/or use of iterative reconstruction technique. CONTRAST:  OMNIPAQUE IOHEXOL 350 MG/ML SOLN COMPARISON:  Chest x-ray 10/31/2021, CT chest 08/11/2020 FINDINGS: Cardiovascular: Satisfactory opacification of the pulmonary arteries to the segmental level. No evidence of pulmonary embolism. Nonaneurysmal aorta. No dissection seen. Coronary vascular calcification. Normal cardiac size. No pericardial effusion Mediastinum/Nodes: Midline trachea. No thyroid mass. Calcified left hilar lymph nodes consistent with prior granulomatous disease. Esophagus within  normal limits Lungs/Pleura: Left pleural thickening with calcification is chronic. Patchy consolidation and ground-glass density in the right greater than left lower lobe suspect for pneumonia. No pleural effusion or pneumothorax. Upper Abdomen: No acute abnormality. Musculoskeletal: Degenerative changes of the spine. No acute osseous abnormality Review of the MIP images confirms the above findings. IMPRESSION: 1. Negative for acute pulmonary embolus. 2. Patchy consolidation in the right greater than left lower lobe, consistent with pneumonia. Imaging follow-up to resolution is recommended. Aortic Atherosclerosis (ICD10-I70.0). Electronically Signed   By: Jasmine Pang M.D.   On: 10/31/2021 19:20    Scheduled Meds:   stroke: mapping our early stages of recovery book   Does not apply Once   ARIPiprazole  10 mg Oral q morning   aspirin  325 mg Oral Daily   atorvastatin  80 mg Oral Daily   [START ON 11/02/2021] buprenorphine  1 patch Transdermal Weekly   buPROPion  300 mg Oral Daily   clopidogrel  75 mg Oral Daily   enoxaparin (LOVENOX) injection  40 mg Subcutaneous Daily   ferrous sulfate  325 mg Oral Daily   insulin aspart  0-9 Units Subcutaneous TID PC,HS,0200   isosorbide mononitrate  30 mg Oral Daily   metoprolol succinate  25 mg Oral Daily   pantoprazole  40 mg Oral Daily   tamsulosin  0.4 mg Oral Daily   Continuous Infusions:  azithromycin Stopped (10/31/21 2215)   cefTRIAXone (ROCEPHIN)  IV Stopped (10/31/21 2059)   lactated ringers 150 mL/hr at 11/01/21 0531     LOS: 0 days   Burnadette Pop, MD [facility] P3/04/2022, 1:14 PM

## 2021-11-01 NOTE — Progress Notes (Signed)
Patient arrived to 6 north room 24 alert and oriented x4 bed in lowest position call light in reach ?

## 2021-11-01 NOTE — Evaluation (Signed)
Occupational Therapy Evaluation ?Patient Details ?Name: Brandon [name] ?MRN: [id number] ?DOB: 1956-08-05 ?Today's Date: 11/01/2021 ? ? ?History of Present Illness Pt is a 66 y/o male presenting with confusion, trouble word finding and L LE weakness. Admitted for AMS, pneumonia. CT negative, MRI pending. PMH includes: anxiety, CAD, HTN, SBO.  ? ?Clinical Impression ?  ?PTA patient independent and driving. Admitted for above and limited by problem list below, including impaired balance, decreased activity tolerance, decreased problem solving and slow processing, and mild dizziness.  He currently requires min guard for mobility and ADL engagement. He demonstrates difficulty sequencing and requires increased time to respond, recommend further functional cognitive testing using pill box.  Believe he will benefit from further OT services acutely and after dc at OP OT level to optimize independence, safety and return to PLOF with IADLS and driving.  ?   ? ?Recommendations for follow up therapy are one component of a multi-disciplinary discharge planning process, led by the attending physician.  Recommendations may be updated based on patient status, additional functional criteria and insurance authorization.  ? ?Follow Up Recommendations ? Outpatient OT  ?  ?Assistance Recommended at Discharge Intermittent Supervision/Assistance  ?Patient can return home with the following A little help with walking and/or transfers;A little help with bathing/dressing/bathroom;Assist for transportation;Assistance with cooking/housework;Direct supervision/assist for medications management;Direct supervision/assist for financial management ? ?  ?Functional Status Assessment ? Patient has had a recent decline in their functional status and demonstrates the ability to make significant improvements in function in a reasonable and predictable amount of time.  ?Equipment Recommendations ? BSC/3in1  ?  ?Recommendations for Other Services   ? ? ?   ?Precautions / Restrictions Precautions ?Precautions: Fall ?Restrictions ?Weight Bearing Restrictions: No  ? ?  ? ?Mobility Bed Mobility ?  ?  ?  ?  ?  ?  ?  ?General bed mobility comments: OOB upon entry ?  ? ?Transfers ?  ?  ?  ?  ?  ?  ?  ?  ?  ?  ?  ? ?  ?Balance Overall balance assessment: Needs assistance ?Sitting-balance support: No upper extremity supported, Feet supported ?Sitting balance-Leahy Scale: Fair ?  ?  ?Standing balance support: No upper extremity supported, During functional activity ?Standing balance-Leahy Scale: Fair ?Standing balance comment: reaching out for support at times, mild dizziness turning head functionally ?  ?  ?  ?  ?  ?  ?  ?  ?  ?  ?  ?   ? ?ADL either performed or assessed with clinical judgement  ? ?ADL Overall ADL's : Needs assistance/impaired ?  ?  ?Grooming: Min guard;Standing ?  ?  ?  ?  ?  ?Upper Body Dressing : Set up;Sitting ?  ?Lower Body Dressing: Min guard;Sit to/from stand ?  ?Toilet Transfer: Min guard;Ambulation ?  ?Toileting- Architect and Hygiene: Min guard;Sit to/from stand ?  ?  ?  ?Functional mobility during ADLs: Min guard;Cueing for safety ?General ADL Comments: pt with mild dizzness with head turns, slow guarded mobility reaching out for UB support at times  ? ? ? ?Vision Baseline Vision/History: 1 Wears glasses ?Ability to See in Adequate Light: 0 Adequate ?Patient Visual Report: No change from baseline ?Vision Assessment?: No apparent visual deficits  ?   ?Perception   ?  ?Praxis   ?  ? ?Pertinent Vitals/Pain Pain Assessment ?Pain Assessment: Faces ?Faces Pain Scale: Hurts a little bit ?Pain Location: L LE ?Pain Descriptors / Indicators: Discomfort ?Pain  Intervention(s): Limited activity within patient's tolerance, Monitored during session, Repositioned  ? ? ? ?Hand Dominance Right ?  ?Extremity/Trunk Assessment Upper Extremity Assessment ?Upper Extremity Assessment: Generalized weakness ?  ?Lower Extremity Assessment ?Lower Extremity  Assessment: Defer to PT evaluation ?  ?  ?  ?Communication Communication ?Communication: Expressive difficulties (slow to express self) ?  ?Cognition Arousal/Alertness: Awake/alert ?Behavior During Therapy: Flat affect ?Overall Cognitive Status: No family/caregiver present to determine baseline cognitive functioning ?Area of Impairment: Problem solving, Awareness, Safety/judgement ?  ?  ?  ?  ?  ?  ?  ?  ?  ?  ?  ?  ?Safety/Judgement: Decreased awareness of safety ?Awareness: Emergent ?Problem Solving: Slow processing, Difficulty sequencing, Requires verbal cues ?General Comments: pt with slow processing and difficulty sequencing.  continue to assess higher level cognition ?  ?  ?General Comments  VSS ? ?  ?Exercises   ?  ?Shoulder Instructions    ? ? ?Home Living Family/patient expects to be discharged to:: Private residence ?Living Arrangements: Spouse/significant other;Children ?Available Help at Discharge: Family;Available 24 hours/day ?Type of Home: Apartment ?Home Access: Stairs to enter ?Entrance Stairs-Number of Steps: flight ?Entrance Stairs-Rails: Left;Right ?Home Layout: One level ?  ?  ?Bathroom Shower/Tub: Tub/shower unit ?  ?Bathroom Toilet: Standard ?Bathroom Accessibility: No ?  ?Home Equipment: Gilmer Mor - single point ?  ?Additional Comments: spouse works at [employer]- nights and weekends ?  ? ?  ?Prior Functioning/Environment Prior Level of Function : Independent/Modified Independent ?  ?  ?  ?  ?  ?  ?Mobility Comments: works and drives ?ADLs Comments: retired, drives ?  ? ?  ?  ?OT Problem List: Decreased strength;Decreased activity tolerance;Impaired balance (sitting and/or standing);Decreased cognition;Decreased knowledge of use of DME or AE;Decreased knowledge of precautions ?  ?   ?OT Treatment/Interventions: Self-care/ADL training;Therapeutic exercise;Energy conservation;DME and/or AE instruction;Therapeutic activities;Cognitive remediation/compensation;Patient/family education;Balance training  ?  ?OT  Goals(Current goals can be found in the care plan section) Acute Rehab OT Goals ?Patient Stated Goal: home, get better ?OT Goal Formulation: With patient ?Time For Goal Achievement: 11/15/21 ?Potential to Achieve Goals: Good  ?OT Frequency: Min 2X/week ?  ? ?Co-evaluation   ?  ?  ?  ?  ? ?  ?AM-PAC OT "6 Clicks" Daily Activity     ?Outcome Measure Help from another person eating meals?: None ?Help from another person taking care of personal grooming?: A Little ?Help from another person toileting, which includes using toliet, bedpan, or urinal?: A Little ?Help from another person bathing (including washing, rinsing, drying)?: A Little ?Help from another person to put on and taking off regular upper body clothing?: A Little ?Help from another person to put on and taking off regular lower body clothing?: A Little ?6 Click Score: 19 ?  ?End of Session Equipment Utilized During Treatment: Gait belt ?Nurse Communication: Mobility status ? ?Activity Tolerance: Patient tolerated treatment well ?Patient left: in chair;with call bell/phone within reach ? ?OT Visit Diagnosis: Other abnormalities of gait and mobility (R26.89);History of falling (Z91.81);Other symptoms and signs involving cognitive function  ?              ?Time: 1062-6948 ?OT Time Calculation (min): 25 min ?Charges:  OT General Charges ?$OT Visit: 1 Visit ?OT Evaluation ?$OT Eval Moderate Complexity: 1 Mod ? ?Barry Brunner, OT ?Acute Rehabilitation Services ?Pager [phone] ?Office [phone] ? ? ?Chancy Milroy ?11/01/2021, 10:53 AM ?

## 2021-11-01 NOTE — Assessment & Plan Note (Signed)
pt recently has had episodes of hyptotension and was recently taken off amlodipine and had decrease of metoprolol ?continue metoprolol ?

## 2021-11-01 NOTE — Progress Notes (Signed)
?  Echocardiogram ?2D Echocardiogram has been performed. ? ?Brandon [name] ?11/01/2021, 11:44 AM ?

## 2021-11-01 NOTE — ED Notes (Signed)
Patient transported to X-ray 

## 2021-11-01 NOTE — Evaluation (Signed)
Physical Therapy Evaluation Patient Details Name: Brandon [name] MRN: [id number] DOB: 26-Apr-1956 Today's Date: 11/01/2021  History of Present Illness  Pt is a 66 y/o male presenting with confusion, trouble word finding and L LE weakness. Admitted for AMS, pneumonia. CT negative, MRI pending. PMH includes: anxiety, CAD s/p multiple stents, HTN, SBO, basal cell carcinoma, DM2, and lumbar fusion.   Clinical Impression  Patient presented to the ED on 10/31/21 with AMS and LLE weakness. Pt's impairments include decreased strength, power, balance, coordination, and safety awareness. These impairments are limiting his ability to safely and independently transfer, ambulate, and navigate stairs. Patient requires min guard to min A with all functional mobility. Pt had one LOB with L horizontal head turns with gait requiring min A for steadying. SPT recommending outpatient PT upon D/C due to mobility deficits. PT will continue to follow acutely to maximize pt's independence with functional mobility.        Recommendations for follow up therapy are one component of a multi-disciplinary discharge planning process, led by the attending physician.  Recommendations may be updated based on patient status, additional functional criteria and insurance authorization.  Follow Up Recommendations Outpatient PT    Assistance Recommended at Discharge Intermittent Supervision/Assistance  Patient can return home with the following  A little help with walking and/or transfers;A little help with bathing/dressing/bathroom;Assistance with cooking/housework;Assist for transportation;Help with stairs or ramp for entrance    Equipment Recommendations Rolling walker (2 wheels);BSC/3in1  Recommendations for Other Services       Functional Status Assessment Patient has had a recent decline in their functional status and demonstrates the ability to make significant improvements in function in a reasonable and predictable amount of  time.     Precautions / Restrictions Precautions Precautions: Fall Restrictions Weight Bearing Restrictions: No      Mobility  Bed Mobility               General bed mobility comments: OOB in standing in front of recliner upon entry    Transfers Overall transfer level: Needs assistance Equipment used: None Transfers: Sit to/from Stand Sit to Stand: Min guard           General transfer comment: required min guard for safety    Ambulation/Gait Ambulation/Gait assistance: Min guard, Min assist Gait Distance (Feet): 150 Feet Assistive device: None Gait Pattern/deviations: Step-through pattern, Decreased step length - right, Decreased step length - left, Decreased stance time - left, Decreased stride length, Decreased weight shift to left, Knee hyperextension - left, Narrow base of support Gait velocity: decreased Gait velocity interpretation: <1.31 ft/sec, indicative of household ambulator   General Gait Details: Pt had very slow, cautious gait; required min guard with one mild LOB with L horizontal head turn that required min A to recover; pt reports dizziness with L head turn. Pt performed some DGI tasks with increased time for processing and performance. Pt taking ~6 small steps to turn around. Pt c/o hip pain near the end of ambulation.  Stairs            Wheelchair Mobility    Modified Rankin (Stroke Patients Only)       Balance Overall balance assessment: Needs assistance Sitting-balance support: No upper extremity supported, Feet supported Sitting balance-Leahy Scale: Fair     Standing balance support: No upper extremity supported Standing balance-Leahy Scale: Fair Standing balance comment: pt had sway and required min guard for safety  Standardized Balance Assessment Standardized Balance Assessment : Dynamic Gait Index   Dynamic Gait Index Level Surface: Moderate Impairment Change in Gait Speed: Moderate  Impairment Gait with Horizontal Head Turns: Moderate Impairment Gait with Vertical Head Turns: Mild Impairment Gait and Pivot Turn: Mild Impairment Step Over Obstacle: Moderate Impairment       Pertinent Vitals/Pain Pain Assessment Pain Assessment: Faces Faces Pain Scale: Hurts a little bit Pain Location: L LE (specifically hip) Pain Descriptors / Indicators: Discomfort Pain Intervention(s): Limited activity within patient's tolerance, Monitored during session    Home Living Family/patient expects to be discharged to:: Private residence Living Arrangements: Spouse/significant other;Children Available Help at Discharge: Family;Available 24 hours/day Type of Home: Apartment Home Access: Stairs to enter Entrance Stairs-Rails: Lawyer of Steps: flight   Home Layout: One level Home Equipment: Cane - single point Additional Comments: spouse works at [employer] - nights and weekends    Prior Function Prior Level of Function : Independent/Modified Independent             Mobility Comments: uses cane intermittently ADLs Comments: retired, Administrator, Civil Service Dominance   Dominant Hand: Right    Extremity/Trunk Assessment   Upper Extremity Assessment Upper Extremity Assessment: Defer to OT evaluation    Lower Extremity Assessment Lower Extremity Assessment: LLE deficits/detail;Generalized weakness LLE Deficits / Details: 2+/5MMT for L hip flexion. 3-/5MMT for L knee flexion; 4-/5 L knee extensors. LLE Sensation: WNL    Cervical / Trunk Assessment Cervical / Trunk Assessment: Normal  Communication   Communication: Expressive difficulties;Other (comment) (slow to express self)  Cognition Arousal/Alertness: Awake/alert Behavior During Therapy: Flat affect Overall Cognitive Status: No family/caregiver present to determine baseline cognitive functioning Area of Impairment: Safety/judgement, Awareness, Problem solving                          Safety/Judgement: Decreased awareness of safety Awareness: Emergent Problem Solving: Slow processing          General Comments General comments (skin integrity, edema, etc.): VSS    Exercises     Assessment/Plan    PT Assessment Patient needs continued PT services  PT Problem List Decreased strength;Decreased range of motion;Decreased activity tolerance;Decreased balance;Decreased mobility;Decreased knowledge of use of DME;Decreased safety awareness;Pain       PT Treatment Interventions DME instruction;Gait training;Stair training;Functional mobility training;Therapeutic activities;Therapeutic exercise;Balance training;Neuromuscular re-education;Patient/family education    PT Goals (Current goals can be found in the Care Plan section)  Acute Rehab PT Goals Patient Stated Goal: to go home PT Goal Formulation: With patient Time For Goal Achievement: 11/15/21 Potential to Achieve Goals: Good    Frequency Min 3X/week     Co-evaluation PT/OT/SLP Co-Evaluation/Treatment: Yes Reason for Co-Treatment: Necessary to address cognition/behavior during functional activity;To address functional/ADL transfers PT goals addressed during session: Mobility/safety with mobility;Balance;Strengthening/ROM         AM-PAC PT "6 Clicks" Mobility  Outcome Measure Help needed turning from your back to your side while in a flat bed without using bedrails?: None Help needed moving from lying on your back to sitting on the side of a flat bed without using bedrails?: None Help needed moving to and from a bed to a chair (including a wheelchair)?: A Little Help needed standing up from a chair using your arms (e.g., wheelchair or bedside chair)?: A Little Help needed to walk in hospital room?: A Little Help needed climbing 3-5 steps with a railing? : A Lot 6 Click Score:  19    End of Session Equipment Utilized During Treatment: Gait belt Activity Tolerance: Patient tolerated treatment  well Patient left: in chair;with call bell/phone within reach (in ED room) Nurse Communication: Mobility status PT Visit Diagnosis: Unsteadiness on feet (R26.81);Other abnormalities of gait and mobility (R26.89);Muscle weakness (generalized) (M62.81);History of falling (Z91.81);Difficulty in walking, not elsewhere classified (R26.2);Dizziness and giddiness (R42)    Time: 1014-1040 PT Time Calculation (min) (ACUTE ONLY): 26 min   Charges:   PT Evaluation $PT Eval Low Complexity: 1 Low         Enis Slipper, SPT  [place] 11/01/2021, 1:46 PM

## 2021-11-01 NOTE — Progress Notes (Addendum)
STROKE TEAM PROGRESS NOTE   INTERVAL HISTORY Patient is seen in his room with no family at the bedside.  He states that about a week ago, he had URI symptoms and was diagnosed with pneumonia.  On Monday, he experienced slurred speech, difficulty with word finding and difficulty thinking.  He had recurrent episodes of this over the past few days and also yesterday noticed increased weakness of his left leg.  HE does have baseline left leg weakness due to back problems.  CTA was negative for LVO.  MRI could not be performed due to severe claustrophobia, so repeat CT has been ordered.  Vitals:   11/01/21 1009 11/01/21 1030 11/01/21 1100 11/01/21 1201  BP: 122/61 127/60 111/63 121/60  Pulse: 72 87 75 67  Resp: 17  18 18   Temp:    97.9 F (36.6 C)  TempSrc:    Oral  SpO2: 94% 94% 93% 97%   CBC:  Recent Labs  Lab 10/31/21 1317 10/31/21 1343  WBC 17.8*  --   NEUTROABS 16.5*  --   HGB 13.3 13.9  HCT 41.3 41.0  MCV 84.1  --   PLT 248  --    Basic Metabolic Panel:  Recent Labs  Lab 10/31/21 1317 10/31/21 1343  NA 133* 132*  K 3.6 3.4*  CL 98  --   CO2 24  --   GLUCOSE 224*  --   BUN 12  --   CREATININE 0.84  --   CALCIUM 8.4*  --    Lipid Panel:  Recent Labs  Lab 11/01/21 0334  CHOL 81  TRIG 110  HDL 22*  CHOLHDL 3.7  VLDL 22  LDLCALC 37   HgbA1c:  Recent Labs  Lab 11/01/21 0334  HGBA1C 6.3*   Urine Drug Screen: No results for input(s): LABOPIA, COCAINSCRNUR, LABBENZ, AMPHETMU, THCU, LABBARB in the last 168 hours.  Alcohol Level No results for input(s): ETH in the last 168 hours.  IMAGING past 24 hours CT ANGIO HEAD NECK W WO CM  Result Date: 10/31/2021 CLINICAL DATA:  TIA EXAM: CT ANGIOGRAPHY HEAD AND NECK TECHNIQUE: Multidetector CT imaging of the head and neck was performed using the standard protocol during bolus administration of intravenous contrast. Multiplanar CT image reconstructions and MIPs were obtained to evaluate the vascular anatomy. Carotid  stenosis measurements (when applicable) are obtained utilizing NASCET criteria, using the distal internal carotid diameter as the denominator. RADIATION DOSE REDUCTION: This exam was performed according to the departmental dose-optimization program which includes automated exposure control, adjustment of the mA and/or kV according to patient size and/or use of iterative reconstruction technique. CONTRAST:  122mL OMNIPAQUE IOHEXOL 350 MG/ML SOLN COMPARISON:  None. FINDINGS: CT HEAD FINDINGS Brain: No evidence of acute infarction, hemorrhage, cerebral edema, mass, mass effect, or midline shift. No hydrocephalus or extra-axial fluid collection. Mildly advanced atrophy for age, particularly in the parietal lobes, with widening of the central sulcus and parieto-occipital sulcus. Vascular: No hyperdense vessel. Skull: Normal. Negative for fracture or focal lesion. Sinuses/Orbits: Fluid in the left sphenoid sinus. The sinuses are otherwise unremarkable. Status post bilateral lens replacements. Other: The mastoid air cells are well aerated. CTA NECK FINDINGS Aortic arch: Standard branching. Imaged portion shows no evidence of aneurysm or dissection. No significant stenosis of the major arch vessel origins. Right carotid system: No evidence of dissection, stenosis (50% or greater) or occlusion. Left carotid system: No evidence of dissection, stenosis (50% or greater) or occlusion. Vertebral arteries: Codominant. No evidence of dissection, stenosis (  50% or greater) or occlusion. Skeleton: Degenerative changes in the cervical spine. No acute osseous abnormality. Other neck: None. Upper chest: No focal pulmonary opacity or pleural effusion. Review of the MIP images confirms the above findings CTA HEAD FINDINGS Anterior circulation: Both internal carotid arteries are patent to the termini, with moderate calcifications that cause mild stenosis, right-greater-than-left. A1 segments patent, with mild narrowing of the mid to distal  right A1. Normal anterior communicating artery. Focal calcification in the proximal left A2 segment. Anterior cerebral arteries are somewhat irregular but patent to their distal aspects. No M1 stenosis or occlusion. Normal MCA bifurcations. Distal MCA branches somewhat irregular but perfused to their distal aspects. Posterior circulation: Vertebral arteries patent to the vertebrobasilar junction without stenosis. Posterior inferior cerebral arteries patent bilaterally. Basilar patent to its distal aspect. Superior cerebellar arteries patent bilaterally. Patent P1 segments. Moderate narrowing in the proximal right medial P3 segment (series 15, image [id number]). PCAs otherwise perfused to their distal aspects without stenosis. The bilateral posterior communicating arteries are not visualized. Venous sinuses: As permitted by contrast timing, patent. Anatomic variants: None significant. Review of the MIP images confirms the above findings IMPRESSION: 1.  No acute intracranial process. 2. No intracranial large vessel occlusion or significant stenosis. Multifocal mild irregularity in the intracranial vasculature, with more focal moderate narrowing in the proximal right medial P3 segment. 3.  No hemodynamically significant stenosis in the neck. 4. Fluid in left sphenoid sinus, which can be seen in the setting of acute sinusitis. Correlate with symptoms. Electronically Signed   By: Merilyn Baba M.D.   On: 10/31/2021 19:26   DG Chest 2 View  Result Date: 10/31/2021 CLINICAL DATA:  Shortness of breath EXAM: CHEST - 2 VIEW COMPARISON:  Chest radiograph dated October 29, 2021. FINDINGS: The heart size and mediastinal contours are within normal limits. Vascular crowding due to low lung volumes. Spinal stimulator is again noted. Degenerate disc disease of the thoracic spine. IMPRESSION: Vascular crowding due to low lung volumes. No definite evidence of pneumonia or pleural effusion. Electronically Signed   By: Keane Police D.O.    On: 10/31/2021 13:43   DG Lumbar Spine 2-3 Views  Result Date: 11/01/2021 CLINICAL DATA:  Fall yesterday, left leg weakness and numbness, back pain. History of L4-L5 fusion in 2006 EXAM: LUMBAR SPINE - 2-3 VIEW COMPARISON:  Lumbar spine radiographs 07/26/2020 FINDINGS: Postsurgical changes reflecting L4-L5 posterior instrumented fusion are noted. The stimulator power pack partially obscures the hardware on the lateral projections, but hardware alignment is grossly within expected limits, without definite evidence of complication. Vertebral body heights are preserved, without evidence of acute fracture. Alignment is normal. There is multilevel degenerative endplate change, most notable at L2-L3. There is mild multilevel disc space narrowing at the nonsurgical levels. IMPRESSION: 1. No evidence of acute injury in the lumbar spine. 2. Status post L4-L5 posterior instrumented fusion without definite evidence of complication. Electronically Signed   By: Valetta Mole M.D.   On: 11/01/2021 08:46   CT Angio Chest PE W/Cm &/Or Wo Cm  Result Date: 10/31/2021 CLINICAL DATA:  Shortness of breath and cough EXAM: CT ANGIOGRAPHY CHEST WITH CONTRAST TECHNIQUE: Multidetector CT imaging of the chest was performed using the standard protocol during bolus administration of intravenous contrast. Multiplanar CT image reconstructions and MIPs were obtained to evaluate the vascular anatomy. RADIATION DOSE REDUCTION: This exam was performed according to the departmental dose-optimization program which includes automated exposure control, adjustment of the mA and/or kV according to patient size  and/or use of iterative reconstruction technique. CONTRAST:  126mL OMNIPAQUE IOHEXOL 350 MG/ML SOLN COMPARISON:  Chest x-ray 10/31/2021, CT chest 08/11/2020 FINDINGS: Cardiovascular: Satisfactory opacification of the pulmonary arteries to the segmental level. No evidence of pulmonary embolism. Nonaneurysmal aorta. No dissection seen. Coronary  vascular calcification. Normal cardiac size. No pericardial effusion Mediastinum/Nodes: Midline trachea. No thyroid mass. Calcified left hilar lymph nodes consistent with prior granulomatous disease. Esophagus within normal limits Lungs/Pleura: Left pleural thickening with calcification is chronic. Patchy consolidation and ground-glass density in the right greater than left lower lobe suspect for pneumonia. No pleural effusion or pneumothorax. Upper Abdomen: No acute abnormality. Musculoskeletal: Degenerative changes of the spine. No acute osseous abnormality Review of the MIP images confirms the above findings. IMPRESSION: 1. Negative for acute pulmonary embolus. 2. Patchy consolidation in the right greater than left lower lobe, consistent with pneumonia. Imaging follow-up to resolution is recommended. Aortic Atherosclerosis (ICD10-I70.0). Electronically Signed   By: Donavan Foil M.D.   On: 10/31/2021 19:20    PHYSICAL EXAM General:  Alert, well-nourished, well-developed patient in no acute distress  Respiratory: Regular, unlabored respirations on room air   NEURO:  Mental Status: AA&Ox3  Speech/Language: speech is without dysarthria or aphasia.  Naming, repetition, fluency, and comprehension intact.  Cranial Nerves:  II: PERRL. Visual fields full.  III, IV, VI: EOMI. Eyelids elevate symmetrically.  V: Sensation is intact to light touch and symmetrical to face.  VII: Smile is symmetrical. VIII: hearing intact to voice. IX, X: Phonation is normal.  XII: tongue is midline without fasciculations. Motor: 5/5 strength to all RUE, RLE and LUE, 3/5 to LLE Tone: is normal and bulk is normal Sensation- Intact to light touch bilaterally.  Coordination: FTN intact bilaterally,  Gait- deferred    ASSESSMENT/PLAN Mr. JAHMAI ALLPORT is a 66 y.o. male with history of CAD s/p PCI, HTN, T2DM and OSA presenting with slurred speech, difficulty with word finding and difficulty thinking.  He has had  recurrent episodes of these symptoms over the past few days and also yesterday noticed increased weakness of his left leg.  He does have baseline left leg weakness due to back problems. The last episode was yesterday and lasted about 10-15 minutes  CTA was negative for LVO.  MRI could not be performed due to severe claustrophobia, so repeat CT has been ordered.  Likely encephalopathy in the setting of infection, but stroke is still in the DDx CT head No acute intracranial process CTA head & neck No acute intracranial process, no LVO or significant stenosis MRI  unable to obtain due to severe claustrophobia and spinal cord stimulator 2D Echo EF 60-65% Repeat CT pending LDL 37 HgbA1c 6.3 VTE prophylaxis - lovenox aspirin 81 mg daily and clopidogrel 75 mg daily prior to admission, now on aspirin 325 mg daily and clopidogrel 75 mg daily.  Therapy recommendations:  outpatient OT, PT pending Disposition:  pending  Lumbar sacral spinal stenosis Chronic lower back pain constant Status post spinal cord stimulator Baseline left lower extremity weakness and pain LLE weakness worsened after a fall 2 days ago.  Hypertension Home meds:  amlodipine 10 mg daily, losartan 25 mg daily, metoprolol 25 mg daily Stable Long-term BP goal normotensive  Hyperlipidemia Home meds:  atorvastatin 80 mg daily, resumed in hospital LDL 37, goal < 70 Continue statin at discharge  Diabetes type II Controlled Home meds:  insulin lispro via pump HgbA1c 6.3, goal < 7.0 CBGs SSI  Other Stroke Risk Factors Advanced Age >/=  92  Former cigarette smoker Coronary artery disease Obstructive sleep apnea, on CPAP at home  Other Active Problems Severe claustrophobia  Hospital day # [place] , MSN, AGACNP-BC [facility] See Amion for schedule and pager information 11/01/2021 1:14 PM  ATTENDING NOTE: I reviewed above note and agree with the assessment and plan. Pt was seen and  examined.   66 year old male with history of hypertension, hyperlipidemia, diabetes, OSA on CPAP, obesity, CAD status post stenting, chronic LBP admitted for confusion, speech difficulty and worsening left leg weakness after a fall.  Bowel bladder function preserved.  CT no acute abnormality, CTA head and neck unremarkable except bilateral ICA siphon atherosclerosis but no significant stenosis.  EF 60 to 65%, LDL 77, A1c 6.3.  Not able to perform MRI due to severe claustrophobia and spinal cord stimulator.  CT repeat pending.  UDS pending.  Creatinine 0.84.  WBC 17.8, UA negative, CTA chest showed no PE but positive bilateral pneumonia.  On exam, patient neurologically intact except left lower extremity proximal 3/5 with left hip pain on leg raising.  Distal strength symmetrical bilateral lower extremity.  Patient symptoms concerning for encephalopathy in the setting of pneumonia and leukocytosis.  Currently much improved, patient stated that he pretty much back to baseline.  However he does have increased left lower extremity weakness than baseline after a fall with left hip pain, concerning for chronic lumbar sacral spinal stenosis.  Continue supportive care and follow-up with PCP and his pain management.  Will consider EMG/NCS at neuro follow-up.  CT pending to rule out stroke at this time. Continue DAPT and statin. Will follow.  For detailed assessment and plan, please refer to above as I have made changes wherever appropriate.   Rosalin Hawking, MD PhD Stroke Neurology 11/01/2021 6:29 PM      To contact Stroke Continuity provider, please refer to [email or web address] After hours, contact General Neurology

## 2021-11-01 NOTE — Assessment & Plan Note (Addendum)
Continue current medications.  Monitor blood pressure ?

## 2021-11-01 NOTE — Assessment & Plan Note (Signed)
Continue sliding scale insulin for now.  Monitor blood sugars.  Hemoglobin A1c of 6.3. ?

## 2021-11-01 NOTE — Assessment & Plan Note (Addendum)
AMS/Trouble word finding/worsening left LE weakness ?-acute metabolic encephalopathy vs  suspectedTIA. 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  ?-CTA head and neck negative for any intracranial large vessel occlusion but had focal moderate narrowing in the proximal right medial P3 segment. Discussed with Dr. Thomasena Edis Neurology and  recommended increase aspirin to 325mg  daily.  ?-pt has severe anxiety to MRI and requires sedation.MRI not done  ?-Echo pending ?-A1c of 6.3, LDL of 37 ?-PT/OT/SLP pending ?-Plan to repeat head today ?

## 2021-11-01 NOTE — Assessment & Plan Note (Signed)
Continue home [place] ?

## 2021-11-01 NOTE — TOC Progression Note (Signed)
Transition of Care (TOC) - Progression Note  ? ? ?Patient Details  ?Name: Brandon [name] ?MRN: [id number] ?Date of Birth: May 27, 1956 ? ?Transition of Care (TOC) CM/SW Contact  ?Kingsley Plan, RN ?Phone Number: ?11/01/2021, 3:47 PM ? ?Clinical Narrative:    ? ?Discussed OP PT and OT with patient.  ? ?Patient's preferred location was [facility]. NCM called [facility] discussed with intake , they do not have neuro PT/OT , they suggested [facility] on [address]. Called same and confirmed . Discussed with patient , he prefers [facility]. Order placed in [facility] .  ? ?NCM ordered walker and 3 in1 with Velna Hatchet with [facility] ? ? ?Transition of Care (TOC) Screening Note ? ? ?Patient Details  ?Name: Brandon [name] ?Date of Birth: [birth year] ? ? ? ? ? ?Transition of Care Department [facility]) has reviewed patient and no TOC needs have been identified at this time. We will continue to monitor patient advancement through interdisciplinary progression rounds. If new patient transition needs arise, please place a TOC consult. ? ? ? ?  ?  ? ?Expected Discharge Plan and Services ?  ?  ?  ?  ?  ?                ?  ?  ?  ?  ?  ?  ?  ?  ?  ?  ? ? ?Social Determinants of Health (SDOH) Interventions ?  ? ?Readmission Risk Interventions ?No flowsheet data found. ? ?

## 2021-11-02 ENCOUNTER — Other Ambulatory Visit (HOSPITAL_BASED_OUTPATIENT_CLINIC_OR_DEPARTMENT_OTHER): Payer: Self-pay

## 2021-11-02 DIAGNOSIS — I10 Essential (primary) hypertension: Secondary | ICD-10-CM | POA: Diagnosis not present

## 2021-11-02 DIAGNOSIS — R4182 Altered mental status, unspecified: Secondary | ICD-10-CM | POA: Diagnosis not present

## 2021-11-02 DIAGNOSIS — J069 Acute upper respiratory infection, unspecified: Secondary | ICD-10-CM | POA: Diagnosis not present

## 2021-11-02 DIAGNOSIS — J189 Pneumonia, unspecified organism: Secondary | ICD-10-CM | POA: Diagnosis not present

## 2021-11-02 DIAGNOSIS — R404 Transient alteration of awareness: Secondary | ICD-10-CM | POA: Diagnosis not present

## 2021-11-02 DIAGNOSIS — Z955 Presence of coronary angioplasty implant and graft: Secondary | ICD-10-CM | POA: Diagnosis not present

## 2021-11-02 LAB — CBC WITH DIFFERENTIAL/PLATELET
Abs Immature Granulocytes: 0.06 10*3/uL (ref 0.00–0.07)
Basophils Absolute: 0 10*3/uL (ref 0.0–0.1)
Basophils Relative: 0 %
Eosinophils Absolute: 0 10*3/uL (ref 0.0–0.5)
Eosinophils Relative: 0 %
HCT: 36.5 % — ABNORMAL LOW (ref 39.0–52.0)
Hemoglobin: 12.3 g/dL — ABNORMAL LOW (ref 13.0–17.0)
Immature Granulocytes: 1 %
Lymphocytes Relative: 10 %
Lymphs Abs: 1.1 10*3/uL (ref 0.7–4.0)
MCH: 27.9 pg (ref 26.0–34.0)
MCHC: 33.7 g/dL (ref 30.0–36.0)
MCV: 82.8 fL (ref 80.0–100.0)
Monocytes Absolute: 1.1 10*3/uL — ABNORMAL HIGH (ref 0.1–1.0)
Monocytes Relative: 10 %
Neutro Abs: 8.5 10*3/uL — ABNORMAL HIGH (ref 1.7–7.7)
Neutrophils Relative %: 79 %
Platelets: 232 10*3/uL (ref 150–400)
RBC: 4.41 MIL/uL (ref 4.22–5.81)
RDW: 19.6 % — ABNORMAL HIGH (ref 11.5–15.5)
WBC: 10.7 10*3/uL — ABNORMAL HIGH (ref 4.0–10.5)
nRBC: 0 % (ref 0.0–0.2)

## 2021-11-02 LAB — BASIC METABOLIC PANEL
Anion gap: 12 (ref 5–15)
BUN: 9 mg/dL (ref 8–23)
CO2: 25 mmol/L (ref 22–32)
Calcium: 7.8 mg/dL — ABNORMAL LOW (ref 8.9–10.3)
Chloride: 99 mmol/L (ref 98–111)
Creatinine, Ser: 0.64 mg/dL (ref 0.61–1.24)
GFR, Estimated: >60 mL/min (ref >60)
Glucose, Bld: 72 mg/dL (ref 70–99)
Potassium: 3.2 mmol/L — ABNORMAL LOW (ref 3.5–5.1)
Sodium: 136 mmol/L (ref 135–145)

## 2021-11-02 LAB — GLUCOSE, CAPILLARY
Glucose-Capillary: 80 mg/dL (ref 70–99)
Glucose-Capillary: 128 mg/dL — ABNORMAL HIGH (ref 70–99)
Glucose-Capillary: 135 mg/dL — ABNORMAL HIGH (ref 70–99)

## 2021-11-02 MED ORDER — POTASSIUM CHLORIDE CRYS ER 20 MEQ PO TBCR
40.0000 meq | EXTENDED_RELEASE_TABLET | Freq: Once | ORAL | Status: AC
  Administered 2021-11-02: 40 meq via ORAL
  Filled 2021-11-02: qty 2

## 2021-11-02 MED ORDER — TIZANIDINE HCL 4 MG PO TABS: 8.0000 mg | ORAL_TABLET | Freq: Three times a day (TID) | ORAL | Status: DC | PRN

## 2021-11-02 MED ORDER — CEPHALEXIN 500 MG PO CAPS: 500.0000 mg | ORAL_CAPSULE | Freq: Three times a day (TID) | ORAL | Status: DC

## 2021-11-02 MED ORDER — ASPIRIN 325 MG PO TABS
325.0000 mg | ORAL_TABLET | Freq: Every day | ORAL | 1 refills | Status: DC
  Filled 2021-11-02: qty 100, 100d supply, fill #0

## 2021-11-02 MED ORDER — CEPHALEXIN 500 MG PO CAPS
500.0000 mg | ORAL_CAPSULE | Freq: Three times a day (TID) | ORAL | 0 refills | Status: AC
  Filled 2021-11-02: qty 13, 5d supply, fill #0

## 2021-11-02 NOTE — Progress Notes (Addendum)
STROKE TEAM PROGRESS NOTE  ? ?INTERVAL HISTORY ?Patient wife is at the bedside. Pt lying in bed, stated that Brandon [name] much improved. Also no more pain on the left leg. CT repeat no acute finding.  ? ?Vitals:  ? 11/01/21 2054 11/02/21 0006 11/02/21 0524 11/02/21 0818  ?BP: (!) 111/51 (!) 153/71 (!) 141/58 133/64  ?Pulse: 80 66 75 83  ?Resp: 17 17 19  (!) 22  ?Temp: 98.9 ?F (37.2 ?C) 98.6 ?F (37 ?C) 99 ?F (37.2 ?C) 98.1 ?F (36.7 ?C)  ?TempSrc: Oral Oral Oral Oral  ?SpO2: 92% 92% 91% 91%  ? ?CBC:  ?Recent Labs  ?Lab 10/31/21 ?1317 10/31/21 ?1343 11/02/21 ?0101  ?WBC 17.8*  --  10.7*  ?NEUTROABS 16.5*  --  8.5*  ?HGB 13.3 13.9 12.3*  ?HCT 41.3 41.0 36.5*  ?MCV 84.1  --  82.8  ?PLT 248  --  232  ? ?Basic Metabolic Panel:  ?Recent Labs  ?Lab 10/31/21 ?1317 10/31/21 ?1343 11/02/21 ?0101  ?NA 133* 132* 136  ?K 3.6 3.4* 3.2*  ?CL 98  --  99  ?CO2 24  --  25  ?GLUCOSE 224*  --  72  ?BUN 12  --  9  ?CREATININE 0.84  --  0.64  ?CALCIUM 8.4*  --  7.8*  ? ?Lipid Panel:  ?Recent Labs  ?Lab 11/01/21 ?0334  ?CHOL 81  ?TRIG 110  ?HDL 22*  ?CHOLHDL 3.7  ?VLDL 22  ?LDLCALC 37  ? ?HgbA1c:  ?Recent Labs  ?Lab 11/01/21 ?0334  ?HGBA1C 6.3*  ? ?Urine Drug Screen: No results for input(s): LABOPIA, COCAINSCRNUR, LABBENZ, AMPHETMU, THCU, LABBARB in the last 168 hours.  ?Alcohol Level No results for input(s): ETH in the last 168 hours. ? ?IMAGING past 24 hours ?CT HEAD WO CONTRAST (01/01/22) ? ?Result Date: 11/01/2021 ?CLINICAL DATA:  TIA. EXAM: CT HEAD WITHOUT CONTRAST TECHNIQUE: Contiguous axial images were obtained from the base of the skull through the vertex without intravenous contrast. RADIATION DOSE REDUCTION: This exam was performed according to the departmental dose-optimization program which includes automated exposure control, adjustment of the mA and/or kV according to patient size and/or use of iterative reconstruction technique. COMPARISON:  October 31, 2021 FINDINGS: Brain: There is mild cerebral atrophy with widening of the  extra-axial spaces and ventricular dilatation. There are areas of decreased attenuation within the white matter tracts of the supratentorial brain, consistent with microvascular disease changes. Vascular: No hyperdense vessel or unexpected calcification. Skull: Normal. Negative for fracture or focal lesion. Sinuses/Orbits: There is marked severity sphenoid sinus mucosal thickening. A 12 mm x 10 mm right maxillary sinus polyp versus mucous retention cyst is seen. Other: None. IMPRESSION: 1. No acute intracranial abnormality. 2. Generalized cerebral atrophy. 3. Marked severity sphenoid sinus disease with a right maxillary sinus polyp versus mucous retention cyst. Electronically Signed   By: November 02, 2021 M.D.   On: 11/01/2021 20:35   ? ?PHYSICAL EXAM ?General:  Alert, well-nourished, well-developed patient in no acute distress ? ?Respiratory: Regular, unlabored respirations on room air ? ? ?NEURO:  ?Mental Status: AA&Ox3  ?Speech/Language: speech is without dysarthria or aphasia.  Naming, repetition, fluency, and comprehension intact. ? ?Cranial Nerves:  ?II: PERRL. Visual fields full.  ?III, IV, VI: EOMI. Eyelids elevate symmetrically.  ?V: Sensation is intact to light touch and symmetrical to face.  ?VII: Smile is symmetrical. ?VIII: hearing intact to voice. ?IX, X: Phonation is normal.  ?XII: tongue is midline without fasciculations. ?Motor: 5/5 strength to all RUE,  RLE and LUE, 4+/5 to LLE proximal, distal 5/5. ?Tone: is normal and bulk is normal ?Sensation- Intact to light touch bilaterally.  ?Coordination: FTN intact bilaterally,  ?Gait- deferred ? ? ? ?ASSESSMENT/[name] ?Brandon [name] is a 66 y.o. male with history of CAD s/p PCI, HTN, T2DM and OSA presenting with slurred speech, difficulty with word finding and difficulty thinking.  He has had recurrent episodes of these symptoms over the past few days and also yesterday noticed increased [name] of Brandon left leg.  He does have baseline left leg  [name] due to back problems. The last episode was yesterday and lasted about 10-15 minutes  CTA was negative for LVO.  MRI could not be performed due to severe claustrophobia, so repeat CT has been ordered. ? ?Likely encephalopathy in the setting of infection ?CT head No acute intracranial process ?CTA head & neck No acute intracranial process, no LVO or significant stenosis ?MRI  unable to obtain due to severe claustrophobia and spinal cord stimulator ?2D Echo EF 60-65% ?Repeat CT no acute abnormalities ?LDL 37 ?HgbA1c 6.3 ?VTE prophylaxis - lovenox ?aspirin 81 mg daily and clopidogrel 75 mg daily prior to admission, now on home aspirin 81 mg daily and clopidogrel 75 mg daily.  ?Therapy recommendations:  outpatient OT, PT pending ?Disposition:  home ? ?Lumbar sacral spinal stenosis  ?Chronic lower back pain constant ?Status post spinal cord stimulator ?Baseline left lower extremity [name] and pain ?LLE [name] worsened after a fall 2 days ago->now improved to baseline ? ?Hypertension ?Home meds:  amlodipine 10 mg daily, losartan 25 mg daily, metoprolol 25 mg daily ?Stable ?Long-term BP goal normotensive ? ?Hyperlipidemia ?Home meds:  atorvastatin 80 mg daily, resumed in hospital ?LDL 37, goal < 70 ?Continue statin at discharge ? ?Diabetes type II Controlled ?Home meds:  insulin lispro via pump ?HgbA1c 6.3, goal < 7.0 ?CBGs ?SSI ? ?Other Stroke Risk Factors ?Advanced Age >/= 66  ?Former cigarette smoker ?Coronary artery disease ?Obstructive sleep apnea, on CPAP at home ? ?Other Active Problems ?Severe claustrophobia ? ?Hospital day # 0 ? ?Neurology will sign off. Please call with questions. Do not think neuro follow up needed at this time. Thanks for the consult. ? ? ?Brandon Plan, MD PhD ?Stroke Neurology ?11/02/2021 ?1:31 PM ? ? ?  ? ?To contact Stroke Continuity provider, please refer to [email or web address]. ?After hours, contact General Neurology  ?

## 2021-11-02 NOTE — Discharge Summary (Addendum)
Physician Discharge Summary  Brandon [name]:[id number] DOB: 1956-02-28 DOA: 10/31/2021  PCP: Luetta Nutting, DO  Admit date: 10/31/2021 Discharge date: 11/02/2021  Admitted From: Home Disposition:  Home  Discharge Condition:Stable CODE STATUS:FULL Diet recommendation: Heart Healthy   Brief/Interim Summary:  Brandon [name] is a 66 y.o. 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  Patient was recently treated for upper respite tract infection by her PCP.  On presentation, lab work showed leukocytosis.  CTA of the chest showed patchy consolidation bilaterally more on the right.  Started antibiotics.  Neurology was following for the concern of TIA.  His respiratory status has improved and is currently on room air.  Repeat CT head did not show any acute findings.  He is medically stable for discharge home today.  Following problems were addressed during his hospitalization:  AMS (altered mental status) AMS/Trouble word finding/worsening left LE weakness -acute metabolic encephalopathy vs  suspectedTIA. 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  -CTA head and neck negative for any intracranial large vessel occlusion but had focal moderate narrowing in the proximal right medial P3 segment. Discussed with  Neurology and  recommended to continue aspirin and plavix.  -pt has severe anxiety to MRI and requires sedation.MRI not done  -Echo showed EF of 60 to 70%, grade 1 diastolic dysfunction, no atrial level shunt -A1c of 6.3, LDL of 37 -PT/OT/SLP done, recommend outpatient follow-up -Repeat CT head did not show any acute findings   Community acquired pneumonia -Started IV Rocephin and Azithromcyin  -currently on room air CT angiogram showed  consolidation bilaterally more on the right.  Chest x-ray did not show pneumonia or effusion -Antibiotics changed to oral on discharge   Coronary artery disease pt recently has had episodes of hyptotension and was recently taken off amlodipine and had decrease of metoprolol continue metoprolol, Imdur   Primary hypertension Continue current medications.  Monitor blood pressure at home   Type 2 diabetes mellitus ([place]) Takes insulin, Ozempic at home.  Hemoglobin A1c of 6.3.   Chronic pain syndrome Continue home Butrans   Status post coronary artery stent placement -Continue aspirin, Plavix, lipitor, Imdur   Discharge Diagnoses:  Principal Problem:   AMS (altered mental status) Active Problems:   Community acquired pneumonia   Primary hypertension   Coronary artery disease   Status post coronary artery stent placement   Chronic pain syndrome   Type 2 diabetes mellitus [facility])    Discharge Instructions  Discharge Instructions     Ambulatory referral to Occupational Therapy   Complete by: As directed    Ambulatory referral to Occupational Therapy   Complete by: As directed    Ambulatory referral to Physical Therapy   Complete by: As directed    Ambulatory referral to Physical Therapy   Complete by: As directed    Diet - low sodium heart healthy   Complete by: As directed    Discharge instructions   Complete by: As directed    1)Please take prescribed medications as instructed 2)Follow up with your primary care physician in a week 3)Follow up with neurology in 4 weeks.  Name and number the provider group has been attached. 4)Follow up with outpatient PT/OT   Increase activity slowly   Complete  by: As directed       Allergies as of 11/02/2021       Reactions   Kiwi Extract Swelling   Mouth swelling.   Other Swelling, Other (See Comments)   EGGPLANT. Mouth swelling.   Penicillins Rash   Reaction: unknown   Peach [prunus Persica] Other (See Comments)   Burns  mouth   Ancef [cefazolin] Rash   Latex Rash   Levaquin [levofloxacin] Rash        Medication List     STOP taking these medications    amLODipine 10 MG tablet Commonly known as: NORVASC   azithromycin 250 MG tablet Commonly known as: Zithromax Z-Pak   losartan 25 MG tablet Commonly known as: COZAAR   Mounjaro 10 MG/0.5ML Pen Generic drug: tirzepatide   nitroGLYCERIN 0.4 MG SL tablet Commonly known as: NITROSTAT   nystatin 100000 UNIT/ML suspension Commonly known as: MYCOSTATIN   omega-3 acid ethyl esters 1 g capsule Commonly known as: LOVAZA   prednisoLONE-lidocaine-nystatin-diphenhydrAMINE-alum & mag hydroxide-simeth   predniSONE 50 MG tablet Commonly known as: DELTASONE   traMADol 50 MG tablet Commonly known as: ULTRAM   traZODone 50 MG tablet Commonly known as: DESYREL       TAKE these medications    albuterol 108 (90 Base) MCG/ACT inhaler Commonly known as: VENTOLIN HFA Inhale 2 puffs by mouth  into the lungs every 6 (six) hours as needed for wheezing.   ARIPiprazole 10 MG tablet Commonly known as: ABILIFY Take 10 mg by mouth every morning.   aspirin 81 MG EC tablet Take 81 mg by mouth every morning.   atorvastatin 80 MG tablet Commonly known as: LIPITOR TAKE 1 TABLET (80 MG TOTAL) BY MOUTH DAILY. What changed: how much to take   b complex vitamins capsule Take 1 capsule by mouth daily.   buprenorphine 20 MCG/HR Ptwk Commonly known as: Butrans Place 1 patch onto the skin once a week.   buPROPion 300 MG 24 hr tablet Commonly known as: WELLBUTRIN XL Take 1 tablet (300 mg total) by mouth daily. What changed: Another medication with the same name was removed. Continue taking this medication, and follow the directions you see here.   Carestart COVID-19 Home Test Kit Generic drug: COVID-19 At Home Antigen Test Use as directed   cephALEXin 500 MG capsule Commonly known as: KEFLEX Take 1 capsule (500 mg total) by mouth every 8 (eight)  hours for 13 doses.   clopidogrel 75 MG tablet Commonly known as: PLAVIX TAKE 1 TABLET (75 MG TOTAL) BY MOUTH DAILY WITH BREAKFAST. What changed: how much to take   Dexcom G6 Sensor Misc Use as directed. Change sensor every 10 days   FeroSul 325 (65 FE) MG tablet Generic drug: ferrous sulfate Take 325 mg by mouth daily.   FREESTYLE LITE test strip Generic drug: glucose blood USE 1 TO CHECK BLOOD GLUCOSE UP TO 5 TIMES DAILY.   HumaLOG 100 UNIT/ML injection Generic drug: insulin lispro For use in pump, total of 60 units per day   Insulin Asp Prot & Asp FlexPen (70-30) 100 UNIT/ML FlexPen Commonly known as: NOVOLOG 70/30 MIX Inject 24 Units into the skin 3 (three) times daily as needed (has dexcom, checks before dose).   isosorbide mononitrate 30 MG 24 hr tablet Commonly known as: IMDUR Take 1 tablet (30 mg total) by mouth daily.   Jardiance 25 MG Tabs tablet Generic drug: empagliflozin Take 1 tablet (25 mg total) by mouth daily.   lidocaine 5 % Commonly known  as: LIDODERM Apply 1 - 3 patches on to the skin as directed. 12 hours on and 12 hours off.   metoCLOPramide 10 MG tablet Commonly known as: REGLAN Take 10 mg by mouth daily as needed for nausea or vomiting (upset stomach).   metoprolol succinate 25 MG 24 hr tablet Commonly known as: Toprol XL Take 1 tablet (25 mg total) by mouth daily.   ondansetron 4 MG disintegrating tablet Commonly known as: ZOFRAN-ODT Take 1 tablet (4 mg total) by mouth every 8 (eight) hours as needed for nausea or vomiting.   Ozempic (2 MG/DOSE) 8 MG/3ML Sopn Generic drug: Semaglutide (2 MG/DOSE) Inject 2 mg into the skin once a week.   pantoprazole 40 MG tablet Commonly known as: PROTONIX Take 1 tablet (40 mg total) by mouth daily.   tamsulosin 0.4 MG Caps capsule Commonly known as: FLOMAX Take 1 capsule (0.4 mg total) by mouth daily.   testosterone 50 MG/5GM (1%) Gel Commonly known as: ANDROGEL Place 5 g (1 packet) onto the  skin daily. Apply to shoulders and upper arms   tiZANidine 4 MG tablet Commonly known as: ZANAFLEX Take 2 tablets (8 mg total) by mouth every 8 (eight) hours as needed for muscle spasms. What changed:  when to take this reasons to take this               Durable Medical Equipment  (From admission, onward)           Start     Ordered   11/01/21 1551  For home use only DME 3 n 1  Once        11/01/21 1551   11/01/21 1551  For home use only DME Walker rolling  Once       Question Answer Comment  Walker: With [place] Wheels   Patient needs a walker to treat with the following condition Weak      11/01/21 [place]. Schedule an appointment as soon as possible for a visit.   Specialty: Rehabilitation Contact information: [place]. [phone] [place] [place]        Luetta Nutting, DO. Schedule an appointment as soon as possible for a visit in 1 week(s).   Specialty: Family Medicine Contact information: [address] [place] [address] [phone]                Allergies  Allergen Reactions   Kiwi Extract Swelling    Mouth swelling.    Other Swelling and Other (See Comments)    EGGPLANT. Mouth swelling.   Penicillins Rash    Reaction: unknown   Peach [Prunus Persica] Other (See Comments)    Burns mouth   Ancef [Cefazolin] Rash   Latex Rash   Levaquin [Levofloxacin] Rash    Consultations: Neurology   Procedures/Studies: CT ANGIO HEAD NECK W WO CM  Result Date: 10/31/2021 CLINICAL DATA:  TIA EXAM: CT ANGIOGRAPHY HEAD AND NECK TECHNIQUE: Multidetector CT imaging of the head and neck was performed using the standard protocol during bolus administration of intravenous contrast. Multiplanar CT image reconstructions and MIPs were obtained to evaluate the vascular anatomy. Carotid stenosis  measurements (when applicable) are obtained utilizing NASCET criteria, using the distal internal carotid diameter as the denominator. RADIATION DOSE REDUCTION: This exam was performed according to the departmental dose-optimization program which  includes automated exposure control, adjustment of the mA and/or kV according to patient size and/or use of iterative reconstruction technique. CONTRAST:  139m OMNIPAQUE IOHEXOL 350 MG/ML SOLN COMPARISON:  None. FINDINGS: CT HEAD FINDINGS Brain: No evidence of acute infarction, hemorrhage, cerebral edema, mass, mass effect, or midline shift. No hydrocephalus or extra-axial fluid collection. Mildly advanced atrophy for age, particularly in the parietal lobes, with widening of the central sulcus and parieto-occipital sulcus. Vascular: No hyperdense vessel. Skull: Normal. Negative for fracture or focal lesion. Sinuses/Orbits: Fluid in the left sphenoid sinus. The sinuses are otherwise unremarkable. Status post bilateral lens replacements. Other: The mastoid air cells are well aerated. CTA NECK FINDINGS Aortic arch: Standard branching. Imaged portion shows no evidence of aneurysm or dissection. No significant stenosis of the major arch vessel origins. Right carotid system: No evidence of dissection, stenosis (50% or greater) or occlusion. Left carotid system: No evidence of dissection, stenosis (50% or greater) or occlusion. Vertebral arteries: Codominant. No evidence of dissection, stenosis (50% or greater) or occlusion. Skeleton: Degenerative changes in the cervical spine. No acute osseous abnormality. Other neck: None. Upper chest: No focal pulmonary opacity or pleural effusion. Review of the MIP images confirms the above findings CTA HEAD FINDINGS Anterior circulation: Both internal carotid arteries are patent to the termini, with moderate calcifications that cause mild stenosis, right-greater-than-left. A1 segments patent, with mild narrowing of the mid to distal right A1.  Normal anterior communicating artery. Focal calcification in the proximal left A2 segment. Anterior cerebral arteries are somewhat irregular but patent to their distal aspects. No M1 stenosis or occlusion. Normal MCA bifurcations. Distal MCA branches somewhat irregular but perfused to their distal aspects. Posterior circulation: Vertebral arteries patent to the vertebrobasilar junction without stenosis. Posterior inferior cerebral arteries patent bilaterally. Basilar patent to its distal aspect. Superior cerebellar arteries patent bilaterally. Patent P1 segments. Moderate narrowing in the proximal right medial P3 segment (series 15, image [id number]). PCAs otherwise perfused to their distal aspects without stenosis. The bilateral posterior communicating arteries are not visualized. Venous sinuses: As permitted by contrast timing, patent. Anatomic variants: None significant. Review of the MIP images confirms the above findings IMPRESSION: 1.  No acute intracranial process. 2. No intracranial large vessel occlusion or significant stenosis. Multifocal mild irregularity in the intracranial vasculature, with more focal moderate narrowing in the proximal right medial P3 segment. 3.  No hemodynamically significant stenosis in the neck. 4. Fluid in left sphenoid sinus, which can be seen in the setting of acute sinusitis. Correlate with symptoms. Electronically Signed   By: AMerilyn BabaM.D.   On: 10/31/2021 19:26   DG Chest 2 View  Result Date: 10/31/2021 CLINICAL DATA:  Shortness of breath EXAM: CHEST - 2 VIEW COMPARISON:  Chest radiograph dated October 29, 2021. FINDINGS: The heart size and mediastinal contours are within normal limits. Vascular crowding due to low lung volumes. Spinal stimulator is again noted. Degenerate disc disease of the thoracic spine. IMPRESSION: Vascular crowding due to low lung volumes. No definite evidence of pneumonia or pleural effusion. Electronically Signed   By: IKeane PoliceD.O.   On:  10/31/2021 13:43   DG Chest 2 View  Result Date: 10/29/2021 CLINICAL DATA:  Persistent cough EXAM: CHEST - 2 VIEW COMPARISON:  10/22/2021 FINDINGS: Cardiac shadow is stable. The lungs are well aerated bilaterally. No focal infiltrate or effusion is seen. Spinal stimulator is again seen and stable. Degenerative changes of the thoracic spine are noted. Old rib fractures are noted on the right.  IMPRESSION: No acute abnormality noted. Electronically Signed   By: Inez Catalina M.D.   On: 10/29/2021 19:43   DG Chest 2 View  Result Date: 10/22/2021 CLINICAL DATA:  Chest pain EXAM: CHEST - 2 VIEW COMPARISON:  08/11/2020 FINDINGS: The heart size and mediastinal contours are within normal limits. Both lungs are clear. The visualized skeletal structures are unremarkable. There is pain control lead in thoracic spinal canal. IMPRESSION: No active cardiopulmonary disease. Electronically Signed   By: Elmer Picker M.D.   On: 10/22/2021 16:57   DG Lumbar Spine 2-3 Views  Result Date: 11/01/2021 CLINICAL DATA:  Fall yesterday, left leg weakness and numbness, back pain. History of L4-L5 fusion in 2006 EXAM: LUMBAR SPINE - 2-3 VIEW COMPARISON:  Lumbar spine radiographs 07/26/2020 FINDINGS: Postsurgical changes reflecting L4-L5 posterior instrumented fusion are noted. The stimulator power pack partially obscures the hardware on the lateral projections, but hardware alignment is grossly within expected limits, without definite evidence of complication. Vertebral body heights are preserved, without evidence of acute fracture. Alignment is normal. There is multilevel degenerative endplate change, most notable at L2-L3. There is mild multilevel disc space narrowing at the nonsurgical levels. IMPRESSION: 1. No evidence of acute injury in the lumbar spine. 2. Status post L4-L5 posterior instrumented fusion without definite evidence of complication. Electronically Signed   By: Valetta Mole M.D.   On: 11/01/2021 08:46   CT  HEAD WO CONTRAST (5MM)  Result Date: 11/01/2021 CLINICAL DATA:  TIA. EXAM: CT HEAD WITHOUT CONTRAST TECHNIQUE: Contiguous axial images were obtained from the base of the skull through the vertex without intravenous contrast. RADIATION DOSE REDUCTION: This exam was performed according to the departmental dose-optimization program which includes automated exposure control, adjustment of the mA and/or kV according to patient size and/or use of iterative reconstruction technique. COMPARISON:  October 31, 2021 FINDINGS: Brain: There is mild cerebral atrophy with widening of the extra-axial spaces and ventricular dilatation. There are areas of decreased attenuation within the white matter tracts of the supratentorial brain, consistent with microvascular disease changes. Vascular: No hyperdense vessel or unexpected calcification. Skull: Normal. Negative for fracture or focal lesion. Sinuses/Orbits: There is marked severity sphenoid sinus mucosal thickening. A 12 mm x 10 mm right maxillary sinus polyp versus mucous retention cyst is seen. Other: None. IMPRESSION: 1. No acute intracranial abnormality. 2. Generalized cerebral atrophy. 3. Marked severity sphenoid sinus disease with a right maxillary sinus polyp versus mucous retention cyst. Electronically Signed   By: Virgina Norfolk M.D.   On: 11/01/2021 20:35   CT Angio Chest PE W/Cm &/Or Wo Cm  Result Date: 10/31/2021 CLINICAL DATA:  Shortness of breath and cough EXAM: CT ANGIOGRAPHY CHEST WITH CONTRAST TECHNIQUE: Multidetector CT imaging of the chest was performed using the standard protocol during bolus administration of intravenous contrast. Multiplanar CT image reconstructions and MIPs were obtained to evaluate the vascular anatomy. RADIATION DOSE REDUCTION: This exam was performed according to the departmental dose-optimization program which includes automated exposure control, adjustment of the mA and/or kV according to patient size and/or use of iterative  reconstruction technique. CONTRAST:  134m OMNIPAQUE IOHEXOL 350 MG/ML SOLN COMPARISON:  Chest x-ray 10/31/2021, CT chest 08/11/2020 FINDINGS: Cardiovascular: Satisfactory opacification of the pulmonary arteries to the segmental level. No evidence of pulmonary embolism. Nonaneurysmal aorta. No dissection seen. Coronary vascular calcification. Normal cardiac size. No pericardial effusion Mediastinum/Nodes: Midline trachea. No thyroid mass. Calcified left hilar lymph nodes consistent with prior granulomatous disease. Esophagus within normal limits Lungs/Pleura: Left pleural thickening  with calcification is chronic. Patchy consolidation and ground-glass density in the right greater than left lower lobe suspect for pneumonia. No pleural effusion or pneumothorax. Upper Abdomen: No acute abnormality. Musculoskeletal: Degenerative changes of the spine. No acute osseous abnormality Review of the MIP images confirms the above findings. IMPRESSION: 1. Negative for acute pulmonary embolus. 2. Patchy consolidation in the right greater than left lower lobe, consistent with pneumonia. Imaging follow-up to resolution is recommended. Aortic Atherosclerosis (ICD10-I70.0). Electronically Signed   By: Donavan Foil M.D.   On: 10/31/2021 19:20   ECHOCARDIOGRAM COMPLETE  Result Date: 11/01/2021    ECHOCARDIOGRAM REPORT   Patient Name:   SCHON ZEIDERS Date of Exam: 11/01/2021 Medical Rec #:  [id number]      Height:       69.0 in Accession #:    [id number]     Weight:       197.0 lb Date of Birth:  07/07/1956      BSA:          2.053 m Patient Age:    73 years       BP:           122/61 mmHg Patient Gender: M              HR:           72 bpm. Exam Location:  Inpatient Procedure: 2D Echo, Cardiac Doppler and Color Doppler Indications:    TIA  History:        Patient has prior history of Echocardiogram examinations, most                 recent 08/13/2020. CAD; Risk Factors:Former Smoker,                 Hypertension, Diabetes and  Dyslipidemia. S/P PCI.  Sonographer:    Clayton Lefort RDCS (AE) Referring Phys: [id number] [place] T TU IMPRESSIONS  1. Left ventricular ejection fraction, by estimation, is 60 to 65%. The left ventricle has normal function. The left ventricle has no regional wall motion abnormalities. There is moderate concentric left ventricular hypertrophy. Left ventricular diastolic parameters are consistent with Grade I diastolic dysfunction (impaired relaxation).  2. Right ventricular systolic function is normal. The right ventricular size is normal. Tricuspid regurgitation signal is inadequate for assessing PA pressure.  3. The mitral valve is normal in structure. No evidence of mitral valve regurgitation. No evidence of mitral stenosis.  4. The aortic valve is tricuspid. Aortic valve regurgitation is not visualized.  5. The inferior vena cava is normal in size with greater than 50% respiratory variability, suggesting right atrial pressure of 3 mmHg. Comparison(s): No significant change from prior study. FINDINGS  Left Ventricle: Left ventricular ejection fraction, by estimation, is 60 to 65%. The left ventricle has normal function. The left ventricle has no regional wall motion abnormalities. The left ventricular internal cavity size was normal in size. There is  moderate concentric left ventricular hypertrophy. Left ventricular diastolic parameters are consistent with Grade I diastolic dysfunction (impaired relaxation). Right Ventricle: The right ventricular size is normal. No increase in right ventricular wall thickness. Right ventricular systolic function is normal. Tricuspid regurgitation signal is inadequate for assessing PA pressure. Left Atrium: Left atrial size was normal in size. Right Atrium: Right atrial size was normal in size. Pericardium: There is no evidence of pericardial effusion. Mitral Valve: The mitral valve is normal in structure. No evidence of mitral valve regurgitation. No evidence of mitral valve stenosis.  MV peak gradient, 4.2 mmHg. The mean mitral valve gradient is 2.0 mmHg. Tricuspid Valve: The tricuspid valve is normal in structure. Tricuspid valve regurgitation is not demonstrated. No evidence of tricuspid stenosis. Aortic Valve: The aortic valve is tricuspid. Aortic valve regurgitation is not visualized. Aortic valve mean gradient measures 3.0 mmHg. Aortic valve peak gradient measures 5.7 mmHg. Aortic valve area, by VTI measures 3.64 cm. Pulmonic Valve: The pulmonic valve was not well visualized. Pulmonic valve regurgitation is not visualized. No evidence of pulmonic stenosis. Aorta: The aortic root and ascending aorta are structurally normal, with no evidence of dilitation. Venous: The inferior vena cava is normal in size with greater than 50% respiratory variability, suggesting right atrial pressure of 3 mmHg. IAS/Shunts: No atrial level shunt detected by color flow Doppler.  LEFT VENTRICLE PLAX 2D LVIDd:         4.30 cm   Diastology LVIDs:         2.90 cm   LV e' medial:    7.18 cm/s LV PW:         1.40 cm   LV E/e' medial:  11.4 LV IVS:        1.30 cm   LV e' lateral:   13.10 cm/s LVOT diam:     2.30 cm   LV E/e' lateral: 6.2 LV SV:         83 LV SV Index:   40 LVOT Area:     4.15 cm  RIGHT VENTRICLE             IVC RV Basal diam:  2.70 cm     IVC diam: 2.00 cm RV S prime:     16.30 cm/s TAPSE (M-mode): 2.5 cm LEFT ATRIUM             Index        RIGHT ATRIUM           Index LA diam:        3.80 cm 1.85 cm/m   RA Area:     16.40 cm LA Vol (A2C):   48.8 ml 23.77 ml/m  RA Volume:   43.70 ml  21.29 ml/m LA Vol (A4C):   39.7 ml 19.34 ml/m LA Biplane Vol: 45.4 ml 22.12 ml/m  AORTIC VALVE AV Area (Vmax):    3.84 cm AV Area (Vmean):   3.60 cm AV Area (VTI):     3.64 cm AV Vmax:           119.00 cm/s AV Vmean:          79.900 cm/s AV VTI:            0.227 m AV Peak Grad:      5.7 mmHg AV Mean Grad:      3.0 mmHg LVOT Vmax:         110.00 cm/s LVOT Vmean:        69.200 cm/s LVOT VTI:          0.199 m  LVOT/AV VTI ratio: 0.88  AORTA Ao Root diam: 3.60 cm Ao Asc diam:  3.30 cm MITRAL VALVE MV Area (PHT): 2.26 cm    SHUNTS MV Area VTI:   2.83 cm    Systemic VTI:  0.20 m MV Peak grad:  4.2 mmHg    Systemic Diam: 2.30 cm MV Mean grad:  2.0 mmHg MV Vmax:       1.02 m/s MV Vmean:      61.2 cm/s MV Decel Time: 335  msec MV E velocity: 81.80 cm/s MV A velocity: 67.60 cm/s MV E/A ratio:  1.21 Rudean Haskell MD Electronically signed by Rudean Haskell MD Signature Date/Time: 11/01/2021/1:52:02 PM    Final       Subjective: Patient seen and examined at the bedside this morning.  Hemodynamically stable for discharge today.  I called the wife and discussed about the discharge planning.  Discharge Exam: Vitals:   11/02/21 0524 11/02/21 0818  BP: (!) 141/58 133/64  Pulse: 75 83  Resp: 19 (!) 22  Temp: 99 F (37.2 C) 98.1 F (36.7 C)  SpO2: 91% 91%   Vitals:   11/01/21 2054 11/02/21 0006 11/02/21 0524 11/02/21 0818  BP: (!) 111/51 (!) 153/71 (!) 141/58 133/64  Pulse: 80 66 75 83  Resp: '17 17 19 ' (!) 22  Temp: 98.9 F (37.2 C) 98.6 F (37 C) 99 F (37.2 C) 98.1 F (36.7 C)  TempSrc: Oral Oral Oral Oral  SpO2: 92% 92% 91% 91%    General: Pt is alert, awake, not in acute distress Cardiovascular: RRR, S1/S2 +, no rubs, no gallops Respiratory: CTA bilaterally, no wheezing, no rhonchi Abdominal: Soft, NT, ND, bowel sounds + Extremities: no edema, no cyanosis    The results of significant diagnostics from this hospitalization (including imaging, microbiology, ancillary and laboratory) are listed below for reference.     Microbiology: Recent Results (from the past 240 hour(s))  Respiratory or Resp and Sputum Culture     Status: Abnormal   Collection Time: 10/29/21 12:00 AM  Result Value Ref Range Status   MICRO NUMBER: [id number]  Final   SPECIMEN QUALITY: Adequate  Final   Source SPUTUM  Final   STATUS: FINAL  Final   GRAM STAIN: (A)  Final    Many White blood cells seen Few  epithelial cells Few Gram positive cocci in clusters Few Gram positive bacilli   ISOLATE 1: Staphylococcus aureus (A)  Final    Comment: Heavy growth of Staphylococcus aureus      Susceptibility   Staphylococcus aureus - RESPIRATORY/SPUTUM CULT POSITIVE 1    VANCOMYCIN <=0.5 Sensitive     CIPROFLOXACIN >=8 Resistant     CLINDAMYCIN <=0.25 Sensitive     LEVOFLOXACIN 4 Intermediate     ERYTHROMYCIN <=0.25 Sensitive     GENTAMICIN <=0.5 Sensitive     OXACILLIN* <=0.25 Sensitive      * Oxacillin susceptible staphylococci are susceptible to other penicillinase-stable penicillins (e.g., methicillin, nafcillin), beta- lactam/beta-lactamase inhibitor combinations, and cephems with staphylococcal indications, including cefazolin.     TETRACYCLINE <=1 Sensitive     TRIMETH/SULFA* <=10 Sensitive      * Oxacillin susceptible staphylococci are susceptible to other penicillinase-stable penicillins (e.g., methicillin, nafcillin), beta- lactam/beta-lactamase inhibitor combinations, and cephems with staphylococcal indications, including cefazolin. Legend: S = Susceptible  I = Intermediate R = Resistant  NS = Not susceptible * = Not tested  NR = Not reported **NN = See antimicrobic comments   Resp Panel by RT-PCR (Flu A&B, Covid) Nasopharyngeal Swab     Status: None   Collection Time: 10/31/21  1:09 PM   Specimen: Nasopharyngeal Swab; Nasopharyngeal(NP) swabs in vial transport medium  Result Value Ref Range Status   SARS Coronavirus 2 by RT PCR NEGATIVE NEGATIVE Final    Comment: (NOTE) SARS-CoV-2 target nucleic acids are NOT DETECTED.  The SARS-CoV-2 RNA is generally detectable in upper respiratory specimens during the acute phase of infection. The lowest concentration of SARS-CoV-2 viral copies this assay can detect is  138 copies/mL. A negative result does not preclude SARS-Cov-2 infection and should not be used as the sole basis for treatment or other patient management decisions. A  negative result may occur with  improper specimen collection/handling, submission of specimen other than nasopharyngeal swab, presence of viral mutation(s) within the areas targeted by this assay, and inadequate number of viral copies(<138 copies/mL). A negative result must be combined with clinical observations, patient history, and epidemiological information. The expected result is Negative.  Fact Sheet for Patients:  EntrepreneurPulse.com.au  Fact Sheet for Healthcare Providers:  IncredibleEmployment.be  This test is no t yet approved or cleared by the [place] FDA and  has been authorized for detection and/or diagnosis of SARS-CoV-2 by FDA under an Emergency Use Authorization (EUA). This EUA will remain  in effect (meaning this test can be used) for the duration of the COVID-19 declaration under Section 564(b)(1) of the Act, 21 U.S.C.section 360bbb-3(b)(1), unless the authorization is terminated  or revoked sooner.       Influenza A by PCR NEGATIVE NEGATIVE Final   Influenza B by PCR NEGATIVE NEGATIVE Final    Comment: (NOTE) The Xpert Xpress SARS-CoV-2/FLU/RSV plus assay is intended as an aid in the diagnosis of influenza from Nasopharyngeal swab specimens and should not be used as a sole basis for treatment. Nasal washings and aspirates are unacceptable for Xpert Xpress SARS-CoV-2/FLU/RSV testing.  Fact Sheet for Patients: EntrepreneurPulse.com.au  Fact Sheet for Healthcare Providers: IncredibleEmployment.be  This test is not yet approved or cleared by the [place] FDA and has been authorized for detection and/or diagnosis of SARS-CoV-2 by FDA under an Emergency Use Authorization (EUA). This EUA will remain in effect (meaning this test can be used) for the duration of the COVID-19 declaration under Section 564(b)(1) of the Act, 21 U.S.C. section 360bbb-3(b)(1), unless the authorization  is terminated or revoked.  Performed at [facility], [place] [address], [place], [place] [address]   Blood Culture (routine x 2)     Status: None (Preliminary result)   Collection Time: 10/31/21  7:33 PM   Specimen: BLOOD  Result Value Ref Range Status   Specimen Description BLOOD SITE NOT SPECIFIED  Final   Special Requests   Final    BOTTLES DRAWN AEROBIC AND ANAEROBIC Blood Culture results may not be optimal due to an inadequate volume of blood received in culture bottles   Culture   Final    NO GROWTH < 24 HOURS Performed at [facility], [place] [address] [place], [place] [address]    Report Status PENDING  Incomplete  Blood Culture (routine x 2)     Status: None (Preliminary result)   Collection Time: 10/31/21  7:38 PM   Specimen: BLOOD  Result Value Ref Range Status   Specimen Description BLOOD LEFT ANTECUBITAL  Final   Special Requests   Final    BOTTLES DRAWN AEROBIC AND ANAEROBIC Blood Culture results may not be optimal due to an inadequate volume of blood received in culture bottles   Culture   Final    NO GROWTH < 24 HOURS Performed at [facility], [place] [address] [place],  [address]    Report Status PENDING  Incomplete     Labs: BNP (last 3 results) No results for input(s): BNP in the last 8760 hours. Basic Metabolic Panel: Recent Labs  Lab 10/31/21 1317 10/31/21 1343 11/02/21 0101  NA 133* 132* 136  K 3.6 3.4* 3.2*  CL 98  --  99  CO2 24  --  25  GLUCOSE 224*  --  72  BUN 12  --  9  CREATININE 0.84  --  0.64  CALCIUM 8.4*  --  7.8*   Liver Function Tests: No results for input(s): AST, ALT, ALKPHOS, BILITOT, PROT, ALBUMIN in the last 168 hours. No results for input(s): LIPASE, AMYLASE in the last 168 hours. No results for input(s): AMMONIA in the last 168 hours. CBC: Recent Labs  Lab 10/31/21 1317 10/31/21 1343 11/02/21 0101  WBC 17.8*  --  10.7*  NEUTROABS 16.5*  --  8.5*  HGB 13.3 13.9 12.3*  HCT 41.3 41.0 36.5*  MCV  84.1  --  82.8  PLT 248  --  232   Cardiac Enzymes: No results for input(s): CKTOTAL, CKMB, CKMBINDEX, TROPONINI in the last 168 hours. BNP: Invalid input(s): POCBNP CBG: Recent Labs  Lab 11/01/21 1621 11/01/21 2054 11/02/21 0210 11/02/21 0819 11/02/21 1301  GLUCAP 131* 158* 80 128* 135*   D-Dimer No results for input(s): DDIMER in the last 72 hours. Hgb A1c Recent Labs    11/01/21 0334  HGBA1C 6.3*   Lipid Profile Recent Labs    11/01/21 0334  CHOL 81  HDL 22*  LDLCALC 37  TRIG 110  CHOLHDL 3.7   Thyroid function studies No results for input(s): TSH, T4TOTAL, T3FREE, THYROIDAB in the last 72 hours.  Invalid input(s): FREET3 Anemia work up No results for input(s): VITAMINB12, FOLATE, FERRITIN, TIBC, IRON, RETICCTPCT in the last 72 hours. Urinalysis    Component Value Date/Time   COLORURINE YELLOW 10/31/2021 2135   APPEARANCEUR CLEAR 10/31/2021 2135   LABSPEC >1.046 (H) 10/31/2021 2135   PHURINE 6.0 10/31/2021 2135   GLUCOSEU >=500 (A) 10/31/2021 2135   HGBUR NEGATIVE 10/31/2021 2135   BILIRUBINUR NEGATIVE 10/31/2021 2135   BILIRUBINUR negative 06/22/2020 1150   KETONESUR 20 (A) 10/31/2021 2135   PROTEINUR NEGATIVE 10/31/2021 2135   UROBILINOGEN 0.2 06/22/2020 1150   NITRITE NEGATIVE 10/31/2021 2135   LEUKOCYTESUR NEGATIVE 10/31/2021 2135   Sepsis Labs Invalid input(s): PROCALCITONIN,  WBC,  LACTICIDVEN Microbiology Recent Results (from the past 240 hour(s))  Respiratory or Resp and Sputum Culture     Status: Abnormal   Collection Time: 10/29/21 12:00 AM  Result Value Ref Range Status   MICRO NUMBER: [id number]  Final   SPECIMEN QUALITY: Adequate  Final   Source SPUTUM  Final   STATUS: FINAL  Final   GRAM STAIN: (A)  Final    Many White blood cells seen Few epithelial cells Few Gram positive cocci in clusters Few Gram positive bacilli   ISOLATE 1: Staphylococcus aureus (A)  Final    Comment: Heavy growth of Staphylococcus aureus       Susceptibility   Staphylococcus aureus - RESPIRATORY/SPUTUM CULT POSITIVE 1    VANCOMYCIN <=0.5 Sensitive     CIPROFLOXACIN >=8 Resistant     CLINDAMYCIN <=0.25 Sensitive     LEVOFLOXACIN 4 Intermediate     ERYTHROMYCIN <=0.25 Sensitive     GENTAMICIN <=0.5 Sensitive     OXACILLIN* <=0.25 Sensitive      * Oxacillin susceptible staphylococci are susceptible to other penicillinase-stable penicillins (e.g., methicillin, nafcillin), beta- lactam/beta-lactamase inhibitor combinations, and cephems with staphylococcal indications, including cefazolin.     TETRACYCLINE <=1 Sensitive     TRIMETH/SULFA* <=10 Sensitive      * Oxacillin susceptible staphylococci are susceptible to other penicillinase-stable penicillins (e.g., methicillin, nafcillin), beta- lactam/beta-lactamase inhibitor combinations, and cephems with staphylococcal indications,  including cefazolin. Legend: S = Susceptible  I = Intermediate R = Resistant  NS = Not susceptible * = Not tested  NR = Not reported **NN = See antimicrobic comments   Resp Panel by RT-PCR (Flu A&B, Covid) Nasopharyngeal Swab     Status: None   Collection Time: 10/31/21  1:09 PM   Specimen: Nasopharyngeal Swab; Nasopharyngeal(NP) swabs in vial transport medium  Result Value Ref Range Status   SARS Coronavirus 2 by RT PCR NEGATIVE NEGATIVE Final    Comment: (NOTE) SARS-CoV-2 target nucleic acids are NOT DETECTED.  The SARS-CoV-2 RNA is generally detectable in upper respiratory specimens during the acute phase of infection. The lowest concentration of SARS-CoV-2 viral copies this assay can detect is 138 copies/mL. A negative result does not preclude SARS-Cov-2 infection and should not be used as the sole basis for treatment or other patient management decisions. A negative result may occur with  improper specimen collection/handling, submission of specimen other than nasopharyngeal swab, presence of viral mutation(s) within the areas targeted  by this assay, and inadequate number of viral copies(<138 copies/mL). A negative result must be combined with clinical observations, patient history, and epidemiological information. The expected result is Negative.  Fact Sheet for Patients:  EntrepreneurPulse.com.au  Fact Sheet for Healthcare Providers:  IncredibleEmployment.be  This test is no t yet approved or cleared by the [place] FDA and  has been authorized for detection and/or diagnosis of SARS-CoV-2 by FDA under an Emergency Use Authorization (EUA). This EUA will remain  in effect (meaning this test can be used) for the duration of the COVID-19 declaration under Section 564(b)(1) of the Act, 21 U.S.C.section 360bbb-3(b)(1), unless the authorization is terminated  or revoked sooner.       Influenza A by PCR NEGATIVE NEGATIVE Final   Influenza B by PCR NEGATIVE NEGATIVE Final    Comment: (NOTE) The Xpert Xpress SARS-CoV-2/FLU/RSV plus assay is intended as an aid in the diagnosis of influenza from Nasopharyngeal swab specimens and should not be used as a sole basis for treatment. Nasal washings and aspirates are unacceptable for Xpert Xpress SARS-CoV-2/FLU/RSV testing.  Fact Sheet for Patients: EntrepreneurPulse.com.au  Fact Sheet for Healthcare Providers: IncredibleEmployment.be  This test is not yet approved or cleared by the [place] FDA and has been authorized for detection and/or diagnosis of SARS-CoV-2 by FDA under an Emergency Use Authorization (EUA). This EUA will remain in effect (meaning this test can be used) for the duration of the COVID-19 declaration under Section 564(b)(1) of the Act, 21 U.S.C. section 360bbb-3(b)(1), unless the authorization is terminated or revoked.  Performed at [facility], [place] [address] [place], [place] [address]   Blood Culture (routine x 2)     Status: None (Preliminary result)    Collection Time: 10/31/21  7:33 PM   Specimen: BLOOD  Result Value Ref Range Status   Specimen Description BLOOD SITE NOT SPECIFIED  Final   Special Requests   Final    BOTTLES DRAWN AEROBIC AND ANAEROBIC Blood Culture results may not be optimal due to an inadequate volume of blood received in culture bottles   Culture   Final    NO GROWTH < 24 HOURS Performed at [facility], [place] [address], [place], [place] [address]    Report Status PENDING  Incomplete  Blood Culture (routine x 2)     Status: None (Preliminary result)   Collection Time: 10/31/21  7:38 PM   Specimen: BLOOD  Result Value Ref  Range Status   Specimen Description BLOOD LEFT ANTECUBITAL  Final   Special Requests   Final    BOTTLES DRAWN AEROBIC AND ANAEROBIC Blood Culture results may not be optimal due to an inadequate volume of blood received in culture bottles   Culture   Final    NO GROWTH < 24 HOURS Performed at [place] [address], [place], [place] [address]    Report Status PENDING  Incomplete    Please note: You were cared for by a hospitalist during your hospital stay. Once you are discharged, your primary care physician will handle any further medical issues. Please note that NO REFILLS for any discharge medications will be authorized once you are discharged, as it is imperative that you return to your primary care physician (or establish a relationship with a primary care physician if you do not have one) for your post hospital discharge needs so that they can reassess your need for medications and monitor your lab values.    Time coordinating discharge: 40 minutes  SIGNED:   Shelly Coss, MD  [facility] 11/02/2021, 2:09 PM Pager [phone]  If 7PM-7AM, please contact night-coverage [email or web address] Password [email or web address]

## 2021-11-02 NOTE — Progress Notes (Signed)
Discharge instructions given to patient. Reviewed up coming appointments and medications. IV removed. Patient discharged  ?

## 2021-11-02 NOTE — Progress Notes (Signed)
OT Cancellation Note ? ?Patient Details ?Name: Brandon [name] ?MRN: [id number] ?DOB: 05-20-1956 ? ? ?Cancelled Treatment:    Reason Eval/Treat Not Completed: Pain limiting ability to participate.  Attempted skilled OT treatment session. RN in room administering pain meds.  Pt. Grimacing with eyes closed attempting to communicate but in visible pain.  Will check back as able.  ? ?Evangeline Gula Lorraine-COTA/L ?11/02/2021, 10:35 AM ?

## 2021-11-05 LAB — CULTURE, BLOOD (ROUTINE X 2)
Culture: NO GROWTH
Culture: NO GROWTH

## 2021-11-08 ENCOUNTER — Other Ambulatory Visit: Payer: Self-pay | Admitting: Family Medicine

## 2021-11-08 ENCOUNTER — Other Ambulatory Visit (HOSPITAL_BASED_OUTPATIENT_CLINIC_OR_DEPARTMENT_OTHER): Payer: Self-pay

## 2021-11-08 MED ORDER — ARIPIPRAZOLE 10 MG PO TABS
10.0000 mg | ORAL_TABLET | Freq: Every morning | ORAL | 1 refills | Status: DC
Start: 2021-11-08 — End: 2022-04-28
  Filled 2021-11-08 – 2021-11-24 (×3): qty 90, 90d supply, fill #0
  Filled 2022-02-01 – 2022-03-08 (×2): qty 90, 90d supply, fill #1

## 2021-11-08 MED ORDER — EMPAGLIFLOZIN 25 MG PO TABS
25.0000 mg | ORAL_TABLET | Freq: Every day | ORAL | 3 refills | Status: DC
  Filled 2021-11-08: qty 90, 90d supply, fill #0
  Filled 2021-11-24 – 2022-02-01 (×2): qty 90, 90d supply, fill #1
  Filled 2022-04-28: qty 90, 90d supply, fill #2
  Filled 2022-08-15 (×2): qty 90, 90d supply, fill #3

## 2021-11-09 ENCOUNTER — Other Ambulatory Visit (HOSPITAL_BASED_OUTPATIENT_CLINIC_OR_DEPARTMENT_OTHER): Payer: Self-pay

## 2021-11-12 ENCOUNTER — Other Ambulatory Visit (HOSPITAL_BASED_OUTPATIENT_CLINIC_OR_DEPARTMENT_OTHER): Payer: Self-pay

## 2021-11-12 ENCOUNTER — Other Ambulatory Visit: Payer: Self-pay

## 2021-11-12 ENCOUNTER — Ambulatory Visit: Payer: No Typology Code available for payment source | Attending: Internal Medicine | Admitting: Occupational Therapy

## 2021-11-12 DIAGNOSIS — M6281 Muscle weakness (generalized): Secondary | ICD-10-CM | POA: Insufficient documentation

## 2021-11-12 DIAGNOSIS — M545 Low back pain, unspecified: Secondary | ICD-10-CM | POA: Insufficient documentation

## 2021-11-12 DIAGNOSIS — Z9181 History of falling: Secondary | ICD-10-CM | POA: Diagnosis present

## 2021-11-12 DIAGNOSIS — R2681 Unsteadiness on feet: Secondary | ICD-10-CM | POA: Diagnosis present

## 2021-11-13 NOTE — Therapy (Signed)
[facility] [address]. [place], [place], [address] Phone: [phone]   Fax:  [phone]  Occupational Therapy Evaluation  Patient Details  Name: Brandon [name] MRN: [id number] Date of Birth: [birth year] Referring Provider (OT): Burnadette Pop, MD  Encounter Date: 11/12/2021   OT End of Session - 11/12/21 1741     Visit Number 1    Number of Visits 13    Date for OT Re-Evaluation 02/10/22    Authorization Type [employer]    Authorization - Visit Number 1    Authorization - Number of Visits 25   OT   Progress Note Due on Visit 10    OT Start Time 1335    OT Stop Time 1430    OT Time Calculation (min) 55 min    Activity Tolerance Patient tolerated treatment well    Behavior During Therapy [facility] for tasks assessed/performed;Flat affect            Past Medical History:  Diagnosis Date   Anxiety    Basal cell carcinoma (BCC) in situ of skin    Colon polyps    Coronary artery disease    S/p DES to mRCA in 12/21 // S/p DES to LCx x 2 in 12/2020 // Diff dz in small to mod Dx (not a good target for PCI); OM1 100 CTO; severe dz in small OM2 and PDA >> Med Rx   Depression    Diabetes mellitus without complication (HCC)    HLD (hyperlipidemia)    Hypertension    SBO (small bowel obstruction) (HCC)     Past Surgical History:  Procedure Laterality Date   BACK SURGERY     CARDIAC CATHETERIZATION     CATARACT EXTRACTION W/ INTRAOCULAR LENS  IMPLANT, BILATERAL     CHOLECYSTECTOMY     CHOLECYSTECTOMY, LAPAROSCOPIC     CORONARY STENT INTERVENTION N/A 08/15/2020   Procedure: CORONARY STENT INTERVENTION;  Surgeon: Corky Crafts, MD;  [facility]: [facility] INV[facility] CV LAB;  Service: Cardiovascular;  Laterality: N/A;   CORONARY STENT INTERVENTION N/A 01/17/2021   Procedure: CORONARY STENT INTERVENTION;  Surgeon: Kathleene Hazel, MD;  Location: [facility];  Service: Cardiovascular;  Laterality: N/A;   EYE  SURGERY     INTRAVASCULAR ULTRASOUND/IVUS N/A 08/15/2020   Procedure: Intravascular Ultrasound/IVUS;  Surgeon: Corky Crafts, MD;  Location: [facility] INVASIVE CV LAB;  Service: Cardiovascular;  Laterality: N/A;   LEFT HEART CATH AND CORONARY ANGIOGRAPHY N/A 08/15/2020   Procedure: LEFT HEART CATH AND CORONARY ANGIOGRAPHY;  Surgeon: Corky Crafts, MD;  Location: [facility] INVASIVE CV LAB;  Service: Cardiovascular;  Laterality: N/A;   LEFT HEART CATH AND CORONARY ANGIOGRAPHY N/A 01/17/2021   Procedure: LEFT HEART CATH AND CORONARY ANGIOGRAPHY;  Surgeon: Kathleene Hazel, MD;  [facility]: [facility];  Service: Cardiovascular;  Laterality: N/A;   LUMBAR FUSION     L3-5 with rods   XI ROBOTIC ASSISTED INGUINAL HERNIA REPAIR WITH MESH     x 3 surgeries    There were no vitals filed for this visit.   Subjective Assessment - 11/12/21 1339     Subjective Pt arrives to session today w/ primary concern of decreased balance and overall endurance. States he presented to the hospital on 10/22/21 and again on 10/31/21 due to episodes of confusion, trouble word finding, and extreme fatigue. Pt reports he also experienced significant LLE weakness w/ the hospitalization in March and fell 2 days prior to  his admission. Pt does have a life alert that he got after noticing increased falls starting about 6 months ago; reports one episode when he was unable to get up.   Pertinent History Episode of transient altered mental status (AMS), suspected encephalopathy in the setting of infection; PMH of multivessel CAD s/p multiple cardiac stents, unstable angina, chronic pain, HTN, T2DM, HLD, OSA on bipap, vertigo, and lumbar sacral spinal stenosis s/p L3-5 lumbar fusion w/ spinal cord stimulator for chronic LBP   Patient Stated Goals Improve balance; "to not fall"   Currently in Pain? Yes   Pain Score 3   Pain Location Back   Pain Orientation Right;Lower   Pain Descriptors / Indicators Aching;Dull   Pain Type  Chronic pain   Pain Onset More than a month ago   Aggravating Factors Twisting, bending, scooping            [facility] OT Assessment - 11/12/21 1508       Assessment   Medical Diagnosis Transient altered mental status (AMS)    Referring Provider (OT) Burnadette Pop, MD    Onset Date/Surgical Date 10/31/21   most recent hospitalization   Hand Dominance Right    Next MD Visit 11/22/21   hospital follow-up w/ PCP   Prior Therapy PT      Precautions   Precautions Fall    Precaution Comments Heavy lifting precaution due to chronic low back pain      Balance Screen   Has the patient fallen in the past 6 months Yes      Home  Environment   Type of Home Aartment    Home Access Stairs    Entrance Stairs-Rails Can reach both    Bathroom Shower/Tub Tub/Shower unit    Home Equipment Walker - standard;Grab bars - tub/shower;Hand held shower head   BSC was recommended while in hospital   Lives With Family   Wife and 2 children; 4 dogs     Prior Function   Level of Independence Independent    Vocation Retired    Leisure play w/ the dogs and watch TV      ADL   Upper Body Bathing Independent    Lower Body Bathing Modified independent    Upper Body Dressing Independent;Increased time    Lower Body Dressing Modified independent    Tub/Shower Transfer Modified independent    Equipment Used Reacher      IADL   Shopping Takes care of all shopping needs independently    Light Housekeeping Performs light daily tasks such as dishwashing, bed making    Meal Prep Plans, prepares and serves adequate meals independently    Training and development officer own vehicle    Medication Management Is responsible for taking medication in correct dosages at correct time      Mobility   Mobility Status History of falls      Activity Tolerance   Activity Tolerance Comments Reports decreased activity tolerance since cardiac event in 2021      Cognition   Overall Cognitive Status Within Functional Limits  for tasks assessed    Cognition Comments Reports no additional episodes of confusion or word finding difficulty since d/c from hospital      Observation/Other Assessments   Outcome Measures TUG: 27 sec      Coordination   Gross Motor Movements are Fluid and Coordinated Yes    Fine Motor Movements are Fluid and Coordinated Yes      ROM / Strength   AROM /  PROM / Strength AROM;Strength      AROM   Overall AROM  Within functional limits for tasks performed    AROM Assessment Site Shoulder;Elbow;Forearm;Wrist;Finger             OT Education - 11/12/21 1738     Education Details Education provided on role and purpose of OT, as well as potential interventions and goals for therapy based on initial evaluation findings. Also provided education on [facility] vs toilet safety frame; discussed various options for purchase, including handout provided w/ options for self-purchase    Person(s) Educated Patient    Methods Explanation;Handout;Demonstration    Comprehension Verbalized understanding             OT Short Term Goals - 11/12/21 1505       OT SHORT TERM GOAL #1   Title Pt will verbalize understanding of at least 2 energy conservation stategies    Baseline Decreased activity tolerance/endurance    Time 3    Period Weeks    Status New    Target Date 12/07/21      OT SHORT TERM GOAL #2   Title Pt will demonstrate independence w/ long-handled sponge to improve participation in bathing tasks    Time 3    Period Weeks    Status New    Target Date 12/07/21      OT SHORT TERM GOAL #3   Title Pt will verbalize understanding of environmental modifications related to fall prevention    Baseline Repeated falls    Time 3    Period Weeks    Status New    Target Date 12/07/21             OT Long Term Goals - 11/13/21 2207       OT LONG TERM GOAL #1   Title Pt will improve TUG time to indicate improved safety w/ functional mobility    Baseline 27 sec    Time 6    Period  Weeks    Status New    Target Date 12/28/21      OT LONG TERM GOAL #2   Title Pt will be able to safely ambulate a specified distance (TBA), using assistive device prn, w/out report of increased pain or fatigue for improved in-home mobility    Baseline TBA    Time 6    Period Weeks    Status New    Target Date 12/28/21      OT LONG TERM GOAL #3   Title Pt will be able to complete a simulated functional task in standing for at least 5 min while demonstrating understanding of energy conservation techniques/good body mechanics    Time 6    Period Weeks    Status New    Target Date 12/28/21             Plan - 11/12/21 1743     Clinical Impression Statement Pt is a 66 y/o male who presents to OP OT due to generalized weakness and imbalance w/ history of falls s/p recent hospitalization 10/31/21 for transient AMS. PMH includes multivessel CAD s/p multiple cardiac stents, unstable angina, chronic pain, HTN, T2DM, HLD, OSA on bipap, vertigo, and lumbar sacral spinal stenosis s/p L3-5 lumbar fusion w/ spinal cord stimulator for chronic LBP. Pt currently lives with his family in a single-level home and demonstrates good safety awareness and understanding of DME. Pt will benefit from skilled occupational therapy services to address strength, balance and fall prevention, energy  conservation techniques, and introduction of additional compensatory strategies, including DME and AE prn, to improve and maintain level of participation and independence, as well as safety during all ADLs.   OT Occupational Profile and History Detailed Assessment- Review of Records and additional review of physical, cognitive, psychosocial history related to current functional performance    Occupational performance deficits (Please refer to evaluation for details): ADL's;IADL's;Leisure;Social Participation    Body Structure / Function / Physical Skills ADL;Decreased knowledge of use of DME;Gait;Strength;Balance;GMC;Body  mechanics;Endurance;Cardiopulmonary status limiting activity;IADL;Mobility    Rehab Potential Fair    Clinical Decision Making Several treatment options, min-mod task modification necessary    Comorbidities Affecting Occupational Performance: Presence of comorbidities impacting occupational performance    Modification or Assistance to Complete Evaluation  Min-Moderate modification of tasks or assist with assess necessary to complete eval    OT Frequency 2x / week    May reduce frequency depending on PT availability   OT Duration 6 weeks    OT Treatment/Interventions Self-care/ADL training;DME and/or AE instruction;Balance training;Aquatic Therapy;Therapeutic activities;Therapeutic exercise;Functional Mobility Training;Patient/family education;Energy conservation    Plan Review energy conservation strategies / fall prevention; assist w/ pt obtaining BSC/toilet safety frame if needed   Consulted and Agree with Plan of Care Patient            Patient will benefit from skilled therapeutic intervention in order to improve the following deficits and impairments:   Body Structure / Function / Physical Skills: ADL, Decreased knowledge of use of DME, Gait, Strength, Balance, GMC, Body mechanics, Endurance, Cardiopulmonary status limiting activity, IADL, Mobility   Visit Diagnosis: History of falling  Bilateral low back pain without sciatica, unspecified chronicity  Muscle weakness (generalized)   Problem List Patient Active Problem List   Diagnosis Date Noted   AMS (altered mental status) 11/01/2021   Type 2 diabetes mellitus (HCC) 11/01/2021   Community acquired pneumonia 10/31/2021   Coughing 10/29/2021   Pain management contract signed 10/11/2021   History of lumbar fusion (L4/5) 10/02/2021   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   Chronic pain syndrome 06/01/2021   OSA (obstructive sleep apnea) 05/28/2021   Thrush 05/21/2021   Metallic taste 05/21/2021   Sacroiliac joint  disease 05/08/2021   Acute pain of left shoulder 04/03/2021   Unstable angina (HCC)    Vertigo 11/28/2020   Myelopathy (HCC) 11/28/2020   Insomnia 09/03/2020   Status post coronary artery stent placement    Mixed hyperlipidemia    Coronary artery disease    Chest pain 08/11/2020   Lumbar facet arthropathy 06/22/2020   Major depression in partial remission (HCC) 06/04/2020   Hyperlipidemia associated with type 2 diabetes mellitus (HCC) 04/14/2020   Primary hypertension 04/07/2020   Type 2 diabetes mellitus with other specified complication (HCC) 04/07/2020   Sepsis (HCC) 10/06/2019   Spinal cord stimulator status 05/19/2017   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   Erectile dysfunction 10/09/2015    Rosie Fate, MSOT, OTR/L  11/12/2021, 3:10 PM  [facility] [address] [place], [place], [address] Phone: [phone]   Fax:  [phone]  Name: JOHAN GILPIN MRN: [id number] Date of Birth: [birth year]

## 2021-11-14 ENCOUNTER — Other Ambulatory Visit: Payer: Self-pay

## 2021-11-14 ENCOUNTER — Ambulatory Visit (INDEPENDENT_AMBULATORY_CARE_PROVIDER_SITE_OTHER): Payer: No Typology Code available for payment source | Admitting: Cardiology

## 2021-11-14 ENCOUNTER — Encounter: Payer: No Typology Code available for payment source | Attending: Endocrinology | Admitting: Nutrition

## 2021-11-14 ENCOUNTER — Encounter: Payer: Self-pay | Admitting: Cardiology

## 2021-11-14 ENCOUNTER — Other Ambulatory Visit (HOSPITAL_BASED_OUTPATIENT_CLINIC_OR_DEPARTMENT_OTHER): Payer: Self-pay

## 2021-11-14 VITALS — BP 124/66 | HR 78 | Ht 69.0 in | Wt 191.2 lb

## 2021-11-14 DIAGNOSIS — G4733 Obstructive sleep apnea (adult) (pediatric): Secondary | ICD-10-CM | POA: Diagnosis not present

## 2021-11-14 DIAGNOSIS — E1169 Type 2 diabetes mellitus with other specified complication: Secondary | ICD-10-CM | POA: Insufficient documentation

## 2021-11-14 DIAGNOSIS — I10 Essential (primary) hypertension: Secondary | ICD-10-CM | POA: Diagnosis not present

## 2021-11-14 DIAGNOSIS — I25118 Atherosclerotic heart disease of native coronary artery with other forms of angina pectoris: Secondary | ICD-10-CM

## 2021-11-14 NOTE — Progress Notes (Signed)
Cardiology Office Note:    Date:  11/14/2021   ID:  Brandon [name], DOB 1955/09/06, MRN [id number]  PCP:  Everrett Coombe, DO  Cardiologist:  Meriam Sprague, MD    Referring MD: Everrett Coombe, DO   Chief Complaint  Patient presents with   Hypertension   Sleep Apnea    History of Present Illness:    Brandon [name] is a 66 y.o. male with a hx of CAD, hypertension, diabetes who was referred for sleep study testing by Dr. Shari Prows.  He has a history of obstructive sleep apnea the past but stopped using his CPAP and was referred back for repeat sleep study to get back on CPAP therapy.  He underwent split-night sleep study in 07-02-22which revealed severe obstructive sleep apnea with an AHI of 45.7/h and O2 saturations as low as 54% consistent with nocturnal hypoxemia.  He failed CPAP therapy due to ongoing events and underwent BiPAP titration and was placed on auto BiPAP with IPAP max 20 cm H2O, EPAP min 5 cm H2O and pressure support 4 cm H2O.  He is now back for follow-up.  He has been struggling with his auto BiPAP device.  He is able to get to sleep with no problem but after sleeping for about 4 hours the mask starts to leak and he takes it off.  He has not changed out the cushion since he started it.  He feels the pressure is adequate. Since going on CPAP he feels rested in the am and has no significant daytime sleepiness.  He has some mild mouth or nasal dryness or nasal congestion.  He recently was hospitalized due PNA that started as a head cold and was concerned that it might be his PAP device and was concerned about how to clean it. He does not think that he snores.     Past Medical History:  Diagnosis Date   Anxiety    Basal cell carcinoma (BCC) in situ of skin    Colon polyps    Coronary artery disease    S/p DES to mRCA in 12/21 // S/p DES to LCx x 2 in 12/2020 // Diff dz in small to mod Dx (not a good target for PCI); OM1 100 CTO; severe dz in small OM2 and PDA >> Med Rx    Depression    Diabetes mellitus without complication (HCC)    HLD (hyperlipidemia)    Hypertension    SBO (small bowel obstruction) (HCC)     Past Surgical History:  Procedure Laterality Date   BACK SURGERY     CARDIAC CATHETERIZATION     CATARACT EXTRACTION W/ INTRAOCULAR LENS  IMPLANT, BILATERAL     CHOLECYSTECTOMY     CHOLECYSTECTOMY, LAPAROSCOPIC     CORONARY STENT INTERVENTION N/A 08/15/2020   Procedure: CORONARY STENT INTERVENTION;  Surgeon: Corky Crafts, MD;  [facility] LAB;  Service: Cardiovascular;  Laterality: N/A;   CORONARY STENT INTERVENTION N/A 01/17/2021   Procedure: CORONARY STENT INTERVENTION;  Surgeon: Kathleene Hazel, MD;  Location: [facility]VAS[facility] LAB;  Service: Cardiovascular;  Laterality: N/A;   EYE SURGERY     INTRAVASCULAR ULTRASOUND/IVUS N/A 08/15/2020   Procedure: Intravascular Ultrasound/IVUS;  Surgeon: Corky Crafts, MD;  Location: [facility] INVASIVE CV LAB;  Service: Cardiovascular;  Laterality: N/A;   LEFT HEART CATH AND CORONARY ANGIOGRAPHY N/A 08/15/2020   Procedure: LEFT HEART CATH AND CORONARY ANGIOGRAPHY;  Surgeon: Corky Crafts, MD;  Location: [facility] INVASIVE CV  LAB;  Service: Cardiovascular;  Laterality: N/A;   LEFT HEART CATH AND CORONARY ANGIOGRAPHY N/A 01/17/2021   Procedure: LEFT HEART CATH AND CORONARY ANGIOGRAPHY;  Surgeon: Kathleene Hazel, MD;  [facility]: [facility] LAB;  Service: Cardiovascular;  Laterality: N/A;   LUMBAR FUSION     L3-5 with rods   XI ROBOTIC ASSISTED INGUINAL HERNIA REPAIR WITH MESH     x 3 surgeries    Current Medications: Current Meds  Medication Sig   albuterol (VENTOLIN HFA) 108 (90 Base) MCG/ACT inhaler Inhale 2 puffs by mouth  into the lungs every 6 (six) hours as needed for wheezing.   ARIPiprazole (ABILIFY) 10 MG tablet Take 1 tablet (10 mg total) by mouth every morning.   aspirin 81 MG EC tablet Take 81 mg by mouth every morning.   atorvastatin (LIPITOR) 80 MG tablet  TAKE 1 TABLET (80 MG TOTAL) BY MOUTH DAILY. (Patient taking differently: Take 80 mg by mouth daily.)   b complex vitamins capsule Take 1 capsule by mouth daily.   buprenorphine (BUTRANS) 20 MCG/HR PTWK Place 1 patch onto the skin once a week.   buPROPion (WELLBUTRIN XL) 300 MG 24 hr tablet Take 1 tablet (300 mg total) by mouth daily.   clopidogrel (PLAVIX) 75 MG tablet TAKE 1 TABLET (75 MG TOTAL) BY MOUTH DAILY WITH BREAKFAST. (Patient taking differently: Take 75 mg by mouth daily with breakfast.)   Continuous Blood Gluc Sensor (DEXCOM G6 SENSOR) MISC Use as directed. Change sensor every 10 days   COVID-19 At Home Antigen Test (CARESTART COVID-19 HOME TEST) KIT Use as directed   empagliflozin (JARDIANCE) 25 MG TABS tablet Take 1 tablet (25 mg total) by mouth daily.   glucose blood test strip USE 1 TO CHECK BLOOD GLUCOSE UP TO 5 TIMES DAILY.   Insulin Asp Prot & Asp FlexPen (NOVOLOG 70/30 MIX) (70-30) 100 UNIT/ML FlexPen Inject 24 Units into the skin 3 (three) times daily as needed (has dexcom, checks before dose).   insulin lispro (HUMALOG) 100 UNIT/ML injection For use in pump, total of 60 units per day   Iron, Ferrous Sulfate, 325 (65 Fe) MG TABS Take 325 mg by mouth daily.   isosorbide mononitrate (IMDUR) 30 MG 24 hr tablet Take 1 tablet (30 mg total) by mouth daily.   lidocaine (LIDODERM) 5 % Apply 1 - 3 patches on to the skin as directed. 12 hours on and 12 hours off.   metoCLOPramide (REGLAN) 10 MG tablet Take 10 mg by mouth daily as needed for nausea or vomiting (upset stomach).   metoprolol succinate (TOPROL XL) 25 MG 24 hr tablet Take 1 tablet (25 mg total) by mouth daily.   ondansetron (ZOFRAN-ODT) 4 MG disintegrating tablet Take 1 tablet (4 mg total) by mouth every 8 (eight) hours as needed for nausea or vomiting.   pantoprazole (PROTONIX) 40 MG tablet Take 1 tablet (40 mg total) by mouth daily.   Semaglutide, 2 MG/DOSE, (OZEMPIC, 2 MG/DOSE,) 8 MG/3ML SOPN Inject 2 mg into the skin once  a week.   tamsulosin (FLOMAX) 0.4 MG CAPS capsule Take 1 capsule (0.4 mg total) by mouth daily.   testosterone (ANDROGEL) 50 MG/5GM (1%) GEL Place 5 g (1 packet) onto the skin daily. Apply to shoulders and upper arms   tiZANidine (ZANAFLEX) 4 MG tablet Take 2 tablets (8 mg total) by mouth every 8 (eight) hours as needed for muscle spasms.     Allergies:   Kiwi extract, Other, Penicillins, Peach [prunus persica],  Ancef [cefazolin], Latex, and Levaquin [levofloxacin]   Social History   Socioeconomic History   Marital status: Married    Spouse name: Not on file   Number of children: 4   Years of education: 16   Highest education level: Not on file  Occupational History   Occupation: disabled  Tobacco Use   Smoking status: Former    Types: Cigarettes    Quit date: 2007    Years since quitting: 16.2   Smokeless tobacco: Never  Vaping Use   Vaping Use: Never used  Substance and Sexual Activity   Alcohol use: Not Currently   Drug use: Not Currently   Sexual activity: Yes  Other Topics Concern   Not on file  Social History Narrative   Not on file   Social Determinants of Health   Financial Resource Strain: Not on file  Food Insecurity: Not on file  Transportation Needs: Not on file  Physical Activity: Not on file  Stress: Not on file  Social Connections: Not on file     Family History: The patient's family history includes Anxiety disorder in his father and mother; COPD in his mother; Cancer in his father; Depression in his father and mother; Other in his father. There is no history of Diabetes.  ROS:   Please see the history of present illness.    ROS  All other systems reviewed and negative.   EKGs/Labs/Other Studies Reviewed:    The following studies were reviewed today: Split-night sleep study, BiPAP titration and Pap compliance download  EKG:  EKG is not ordered today.    Recent Labs: 10/22/2021: ALT 15 11/02/2021: BUN 9; Creatinine, Ser 0.64; Hemoglobin 12.3;  Platelets 232; Potassium 3.2; Sodium 136   Recent Lipid Panel    Component Value Date/Time   CHOL 81 11/01/2021 0334   CHOL 113 10/23/2020 0824   TRIG 110 11/01/2021 0334   HDL 22 (L) 11/01/2021 0334   HDL 45 10/23/2020 0824   CHOLHDL 3.7 11/01/2021 0334   VLDL 22 11/01/2021 0334   LDLCALC 37 11/01/2021 0334   LDLCALC 53 10/23/2020 0824    CHA2DS2-VASc Score =   [ ] .  Therefore, the patient's annual risk of stroke is   %.        Physical Exam:    VS:  BP 124/66   Pulse 78   Ht 5\' 9"  (1.753 m)   Wt 191 lb 3.2 oz (86.7 kg)   SpO2 95%   BMI 28.24 kg/m     Wt Readings from Last 3 Encounters:  11/14/21 191 lb 3.2 oz (86.7 kg)  10/29/21 197 lb (89.4 kg)  10/24/21 203 lb 12.8 oz (92.4 kg)     GEN:  Well nourished, well developed in no acute distress HEENT: Normal NECK: No JVD; No carotid bruits LYMPHATICS: No lymphadenopathy CARDIAC: RRR, no murmurs, rubs, gallops RESPIRATORY:  Clear to auscultation without rales, wheezing or rhonchi  ABDOMEN: Soft, non-tender, non-distended MUSCULOSKELETAL:  No edema; No deformity  SKIN: Warm and dry NEUROLOGIC:  Alert and oriented x 3 PSYCHIATRIC:  Normal affect   ASSESSMENT:    1. OSA (obstructive sleep apnea)   2. Primary hypertension    PLAN:    In order of problems listed above:   OSA - The patient is tolerating PAP therapy well without any problems. The PAP download performed by his DME was personally reviewed and interpreted by me today and showed an AHI of 27.3/hr on auto BIPAP cm H2O with 3% compliance  in using more than 4 hours nightly.  The patient has been using and benefiting from PAP use and will continue to benefit from therapy.  - I will order supplies and get an in person appt with DME due to significant mask leakage - I will see him back in 2 months and if still struggling with the PAP device then will refer to ENT to be considered for INspire device  2.  Hypertension -BP is controlled on exam today -Continue  prescription drug management with Toprol-XL 25 mg daily with as needed refills   Time Spent: 20 minutes total time of encounter, including 15 minutes spent in face-to-face patient care on the date of this encounter. This time includes coordination of care and counseling regarding above mentioned problem list. Remainder of non-face-to-face time involved reviewing chart documents/testing relevant to the patient encounter and documentation in the medical record. I have independently reviewed documentation from referring provider  Medication Adjustments/Labs and Tests Ordered: Current medicines are reviewed at length with the patient today.  Concerns regarding medicines are outlined above.  No orders of the defined types were placed in this encounter.  No orders of the defined types were placed in this encounter.   Signed, Armanda Magic, MD  11/14/2021 1:39 PM    [facility]

## 2021-11-14 NOTE — Patient Instructions (Signed)
Medication Instructions:  ?Your physician recommends that you continue on your current medications as directed. Please refer to the Current Medication list given to you today. ? ?*If you need a refill on your cardiac medications before your next appointment, please call your pharmacy* ? ?Follow-Up: ?At [facility], you and your health needs are our priority.  As part of our continuing mission to provide you with exceptional heart care, we have created designated Provider Care Teams.  These Care Teams include your primary Cardiologist (physician) and Advanced Practice Providers (APPs -  Physician Assistants and Nurse Practitioners) who all work together to provide you with the care you need, when you need it. ? ?Your next appointment:   ?8 week(s) ? ?The format for your next appointment:   ?In Person ? ?Provider:   ?Traci Turner, MD ? ? ?

## 2021-11-15 NOTE — Progress Notes (Signed)
We discussed how insulin pumps deliver insulin and he was trained on how to use the Tandem insulin pump.  Settings were put into the pump per Dr. Bonney Roussel' orders:   Basal rate: 0.1u/hr, ISF: 100, I/C: 5, target: 120 with corrections over 120.  He had stopped his Lantus last night and was shown how to fill a cartridge, attache the infusion set, and to give a bolus, and correction bolus. He redemonstrated all of the above correctioly. He reports knowing how to carb count, and refuse information about this topic a this time. ?We discussed high blood sugar protocol, and he was given a handout on this as well as supplies to carry with him at all times, and topics on pump usage, like changing a cartridge,start/stop pump, using the infusion set, high and low blood sugar protocols, sick day guidelines.   We reivewed all handouts and covered all topics on the checklist and he signed this indication understanding of all topics  He was told to schedule an appointment with Dr. Everardo All for next week, and he agreed to do this with no final questions. ?

## 2021-11-15 NOTE — Patient Instructions (Signed)
Read all handouts given and call if questions. ?Call Tandem help line if questions about pump usage ?Call office if blood sugars remain over 200 or if you drop below 70 ?

## 2021-11-16 ENCOUNTER — Telehealth: Payer: Self-pay | Admitting: *Deleted

## 2021-11-16 DIAGNOSIS — G4733 Obstructive sleep apnea (adult) (pediatric): Secondary | ICD-10-CM

## 2021-11-16 NOTE — Telephone Encounter (Signed)
-----   Message from Theresia Majors, RN sent at 11/14/2021  1:57 PM EDT ----- ?Per Dr. Mayford Knife: ?Order supplies and get an in person appt with DME due to significant mask leakage. ?Thanks! ? ?

## 2021-11-20 ENCOUNTER — Encounter: Payer: Self-pay | Admitting: Physical Therapy

## 2021-11-20 ENCOUNTER — Ambulatory Visit: Payer: No Typology Code available for payment source | Admitting: Physical Therapy

## 2021-11-20 ENCOUNTER — Other Ambulatory Visit: Payer: Self-pay

## 2021-11-20 ENCOUNTER — Ambulatory Visit: Payer: No Typology Code available for payment source | Admitting: Occupational Therapy

## 2021-11-20 DIAGNOSIS — R2681 Unsteadiness on feet: Secondary | ICD-10-CM

## 2021-11-20 DIAGNOSIS — M545 Low back pain, unspecified: Secondary | ICD-10-CM

## 2021-11-20 DIAGNOSIS — M6281 Muscle weakness (generalized): Secondary | ICD-10-CM

## 2021-11-20 DIAGNOSIS — Z9181 History of falling: Secondary | ICD-10-CM

## 2021-11-20 NOTE — Therapy (Signed)
[facility] [address] [place], [place], [address] Phone: [phone]   Fax:  [phone]  Physical Therapy Evaluation  Patient Details  Name: Brandon [name] MRN: [id number] Date of Birth: 18-Oct-1955 Referring Provider (PT): Adhikari   Encounter Date: 11/20/2021   PT End of Session - 11/20/21 1804     Visit Number 1    Number of Visits 13    Date for PT Re-Evaluation 01/01/22    Authorization Type [employer] and Medicare    Authorization Time Period 11/20/21 to 01/01/22    Authorization - Number of Visits 25    PT Start Time 1619    PT Stop Time 1657    PT Time Calculation (min) 38 min    Activity Tolerance Patient tolerated treatment well    Behavior During Therapy [facility] for tasks assessed/performed             Past Medical History:  Diagnosis Date   Anxiety    Basal cell carcinoma (BCC) in situ of skin    Colon polyps    Coronary artery disease    S/p DES to mRCA in 12/21 // S/p DES to LCx x 2 in 12/2020 // Diff dz in small to mod Dx (not a good target for PCI); OM1 100 CTO; severe dz in small OM2 and PDA >> Med Rx   Depression    Diabetes mellitus without complication (HCC)    HLD (hyperlipidemia)    Hypertension    SBO (small bowel obstruction) (HCC)     Past Surgical History:  Procedure Laterality Date   BACK SURGERY     CARDIAC CATHETERIZATION     CATARACT EXTRACTION W/ INTRAOCULAR LENS  IMPLANT, BILATERAL     CHOLECYSTECTOMY     CHOLECYSTECTOMY, LAPAROSCOPIC     CORONARY STENT INTERVENTION N/A 08/15/2020   Procedure: CORONARY STENT INTERVENTION;  Surgeon: Corky Crafts, MD;  Location: [facility] INVASIVE CV LAB;  Service: Cardiovascular;  Laterality: N/A;   CORONARY STENT INTERVENTION N/A 01/17/2021   Procedure: CORONARY STENT INTERVENTION;  Surgeon: Kathleene Hazel, MD;  Location: [facility] INVASIVE CV LAB;  Service: Cardiovascular;  Laterality: N/A;   EYE SURGERY     INTRAVASCULAR ULTRASOUND/IVUS N/A  08/15/2020   Procedure: Intravascular Ultrasound/IVUS;  Surgeon: Corky Crafts, MD;  Location[facility]VASIVE CV LAB;  Service: Cardiovascular;  Laterality: N/A;   LEFT HEART CATH AND CORONARY ANGIOGRAPHY N/A 08/15/2020   Procedure: LEFT HEART CATH AND CORONARY ANGIOGRAPHY;  Surgeon: Corky Crafts, MD;  Location: [facility] INVASIVE CV LAB;  Service: Cardiovascular;  Laterality: N/A;   LEFT HEART CATH AND CORONARY ANGIOGRAPHY N/A 01/17/2021   Procedure: LEFT HEART CATH AND CORONARY ANGIOGRAPHY;  Surgeon: Kathleene Hazel, MD;  Location: [facility] INVASIVE CV LAB;  Service: Cardiovascular;  Laterality: N/A;   LUMBAR FUSION     L3-5 with rods   XI ROBOTIC ASSISTED INGUINAL HERNIA REPAIR WITH MESH     x 3 surgeries    There were no vitals filed for this visit.    Subjective Assessment - 11/20/21 1620     Subjective Several months ago I bent down to pick up some dog poop in a yard, I fell flat on my rear end; if I go down on one leg I lose my balance, if I turn too quickly it takes awhile for the room to catch up. I fell several weeks ago right before I went into the hospital. I was really weak in the hospital.  I do feel very lightly dizzy especially when I get up too quickly, the room tends to tilt and they have reduced some of my BP meds to try and help with this. My cardiologist is keeping an eye on my orthostatics, I check orthostatics on my own at home too. I do get dizzy when I turn my head quickly too, this dizziness has been noticeable the past couple of weeks.    Patient Stated Goals improve balance, be more stable on my feet    Currently in Pain? Yes    Pain Score 4     Pain Location Back    Pain Orientation Right;Left    Pain Descriptors / Indicators Aching;Dull    Pain Type Chronic pain    Pain Radiating Towards wraps around from back to front, does not go down legs    Pain Onset More than a month ago    Pain Frequency Constant    Aggravating Factors  twisting, bending, scooping     Pain Relieving Factors relaxing                [facility] PT Assessment - 11/20/21 0001       Assessment   Medical Diagnosis Transient altered mental status (AMS)    Referring Provider (PT) Adhikari    Onset Date/Surgical Date 10/31/21    Next MD Visit March 30th with PCP    Prior Therapy PT      Precautions   Precautions Fall    Precaution Comments Heavy lifting precaution due to chronic low back pain no more than 15#; does not tolerate supine or prone well due to back pain      Restrictions   Weight Bearing Restrictions No      Balance Screen   Has the patient fallen in the past 6 months Yes    How many times? 2- one I was walking across the room and my foot got stuck on the carpet, the other I was outside and bent down on one leg and lost my balance getting up    Has the patient had a decrease in activity level because of a fear of falling?  Yes    Is the patient reluctant to leave their home because of a fear of falling?  No      Home Tourist information centre manager residence      Prior Function   Level of Independence Independent    Vocation Retired    Leisure play w/ the dogs and watch TV      ROM / Strength   AROM / PROM / Strength Strength      Strength   Strength Assessment Site Hip;Knee;Ankle    Right/Left Hip Right;Left    Right Hip Flexion 3-/5    Right Hip Extension 2-/5    Right Hip ABduction 2/5    Left Hip Flexion 3-/5    Left Hip Extension 2-/5    Left Hip ABduction 3-/5    Right/Left Knee Right;Left    Right Knee Flexion 4-/5    Right Knee Extension 4+/5    Left Knee Flexion 3/5    Left Knee Extension 4/5    Right/Left Ankle Right;Left    Right Ankle Dorsiflexion 5/5    Left Ankle Dorsiflexion 5/5      Standardized Balance Assessment   Standardized Balance Assessment Berg Balance Test;Dynamic Gait Index      Berg Balance Test   Sit to Stand Able to stand without using  hands and stabilize independently    Standing Unsupported  Able to stand safely 2 minutes    Sitting with Back Unsupported but Feet Supported on Floor or Stool Able to sit safely and securely 2 minutes    Stand to Sit Sits safely with minimal use of hands    Transfers Able to transfer safely, minor use of hands    Standing Unsupported with Eyes Closed Able to stand 10 seconds with supervision    Standing Unsupported with Feet Together Able to place feet together independently and stand for 1 minute with supervision    From Standing, Reach Forward with Outstretched Arm Can reach confidently >25 cm (10")    From Standing Position, Pick up Object from Floor Able to pick up shoe, needs supervision    From Standing Position, Turn to Look Behind Over each Shoulder Turn sideways only but maintains balance    Turn 360 Degrees Able to turn 360 degrees safely but slowly    Standing Unsupported, Alternately Place Feet on Step/Stool Able to stand independently and complete 8 steps >20 seconds    Standing Unsupported, One Foot in Front Able to plae foot ahead of the other independently and hold 30 seconds    Standing on One Leg Able to lift leg independently and hold equal to or more than 3 seconds    Total Score 45      Dynamic Gait Index   Level Surface Mild Impairment    Change in Gait Speed Mild Impairment    Gait with Horizontal Head Turns Moderate Impairment    Gait with Vertical Head Turns Moderate Impairment    Gait and Pivot Turn Moderate Impairment    Step Over Obstacle Moderate Impairment    Step Around Obstacles Moderate Impairment    Steps Mild Impairment    Total Score 11                        Objective measurements completed on examination: See above findings.                PT Education - 11/20/21 1803     Education Details exam findings, POC, HEP; role of functional muscle weakness in poor balance    Person(s) Educated Patient    Methods Explanation    Comprehension Verbalized understanding;Returned  demonstration              PT Short Term Goals - 11/20/21 1806       PT SHORT TERM GOAL #1   Title Will be compliant with appropriate progressive  HEP    Time 3    Period Weeks    Status New    Target Date 12/11/21      PT SHORT TERM GOAL #2   Title Will be able to name 3 ways to prevent falls at home and in the community    Time 3    Period Weeks    Status New      PT SHORT TERM GOAL #3   Title Will be able to maintain SLS for 15 seconds B and tandem stance for 30 seconds B to show improved functional balance    Time 3    Period Weeks    Status New               PT Long Term Goals - 11/20/21 1808       PT LONG TERM GOAL #1   Title MMT to improve by at  least 1 grade in all weak groups to show improved functional strength    Time 6    Period Weeks    Status New    Target Date 01/01/22      PT LONG TERM GOAL #2   Title Berg score to impove to 50/56 and DGI to improve to 18/24 to show reduced fall risk    Time 6    Period Weeks    Status New      PT LONG TERM GOAL #3   Title Will be able to get from kneel to stand with no more than min guard assistance to show improved strength and balance    Time 6    Period Weeks    Status New      PT LONG TERM GOAL #4   Title Will be independent with appropriate gym based exercise program to maintain functional gains    Time 6    Period Weeks    Status New                    Plan - 11/20/21 1804     Clinical Impression Statement Brandon [name] arrives today with concerns about his balance and strength- he has had some falls recently, and has a very hard time getting up from a kneeling position. Exam reveals severe muscle weakness in BLEs as well as significant impairments in balance and functional activity tolerance. Will benefit from skilled PT services to address deficits, build strength, and reduce fall risk moving forward.    Personal Factors and Comorbidities Fitness;Time since onset of  injury/illness/exacerbation    Examination-Activity Limitations Locomotion Level;Transfers;Squat;Stairs;Stand    Examination-Participation Restrictions Community Activity;Shop;Yard Work    Conservation officer, historic buildings Stable/Uncomplicated    Clinical Decision Making Low    Rehab Potential Good    PT Frequency 2x / week    PT Duration 6 weeks    PT Treatment/Interventions ADLs/Self Care Home Management;DME Instruction;Gait training;Stair training;Functional mobility training;Therapeutic activities;Therapeutic exercise;Balance training;Neuromuscular re-education;Patient/family education;Manual techniques;Energy conservation;Taping    PT Next Visit Plan expand HEP, focus on strength, balance, functiona activity tolerance    PT Home Exercise Plan [email or web address]    Consulted and Agree with Plan of Care Patient             Patient will benefit from skilled therapeutic intervention in order to improve the following deficits and impairments:  Difficulty walking, Decreased coordination, Decreased activity tolerance, Decreased balance, Decreased mobility, Decreased strength  Visit Diagnosis: Muscle weakness (generalized)  Unsteadiness on feet  History of falling     Problem List Patient Active Problem List   Diagnosis Date Noted   AMS (altered mental status) 11/01/2021   Type 2 diabetes mellitus (HCC) 11/01/2021   Community acquired pneumonia 10/31/2021   Coughing 10/29/2021   Pain management contract signed 10/11/2021   History of lumbar fusion (L4/5) 10/02/2021   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   Chronic pain syndrome 06/01/2021   OSA (obstructive sleep apnea) 05/28/2021   Thrush 05/21/2021   Metallic taste 05/21/2021   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   Acute pain of left shoulder 04/03/2021   Unstable angina (HCC)    Vertigo 11/28/2020   Myelopathy (HCC) 11/28/2020   Insomnia 09/03/2020   Status post coronary artery stent placement    Mixed hyperlipidemia     Coronary artery disease    Chest pain 08/11/2020   Lumbar facet arthropathy 06/22/2020   Major depression in partial remission (HCC) 06/04/2020   Hyperlipidemia associated  with type 2 diabetes mellitus (HCC) 04/14/2020   Primary hypertension 04/07/2020   Type 2 diabetes mellitus with other specified complication (HCC) 04/07/2020   Sepsis (HCC) 10/06/2019   Spinal cord stimulator status 05/19/2017   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   Erectile dysfunction 10/09/2015   Lerry Liner PT, DPT, PN2   Supplemental Physical Therapist [facility]       [facility] [address] [place], [place], [address] Phone: [phone]   Fax:  [phone]  Name: Brandon [name] MRN: [id number] Date of Birth: 06-Apr-1956

## 2021-11-21 ENCOUNTER — Ambulatory Visit (INDEPENDENT_AMBULATORY_CARE_PROVIDER_SITE_OTHER): Payer: No Typology Code available for payment source | Admitting: Endocrinology

## 2021-11-21 VITALS — BP 106/70 | HR 85 | Ht 69.0 in | Wt 192.2 lb

## 2021-11-21 DIAGNOSIS — Z794 Long term (current) use of insulin: Secondary | ICD-10-CM | POA: Diagnosis not present

## 2021-11-21 DIAGNOSIS — E1169 Type 2 diabetes mellitus with other specified complication: Secondary | ICD-10-CM

## 2021-11-21 DIAGNOSIS — I25118 Atherosclerotic heart disease of native coronary artery with other forms of angina pectoris: Secondary | ICD-10-CM

## 2021-11-21 NOTE — Progress Notes (Signed)
Subjective:    Patient ID: Brandon [name], male    DOB: 08-16-1956, 66 y.o.   MRN: [id number]  HPI Pt returns for f/u of diabetes mellitus: DM type: Insulin-requiring type 2  Dx'ed: 2004 Complications: CAD Therapy: insulin since 2012, and Ozempic.  DKA: never Severe hypoglycemia: never Pancreatitis: never Pancreatic imaging: normal on 2021 CT SDOH: none Other: he takes multiple daily injections; he eats meals at 8AM, 1PM, and 7PM.   Interval history:  He started tandem pump 11/14/21.  he takes these settings:  basal rate of 0.05 units/hr (when not in Control-IQ).  bolus of 1 unit/5 grams carbohydrate.   correction bolus (which some people call "sensitivity," or "insulin sensitivity ratio," or just "isr") of 1 unit for each 100 by which your glucose exceeds 120.  I reviewed continuous glucose monitor data.  Glucose varies from 70-210.  It is in general highest at 8AM-11AM, and 9PM.  It is lowest at 1AM-6AM.  It is flat overnight.  TDD is 11 units (80% bolus).  He is in control IQ 43% of the time.   He has mild hypoglycemia approx QD.  This usually happens fasting.  He called tech support, due to problems with "Control-IQ" function.  They said this is a systemic problem with Tandem.   Past Medical History:  Diagnosis Date   Anxiety    Basal cell carcinoma (BCC) in situ of skin    Colon polyps    Coronary artery disease    S/p DES to mRCA in 12/21 // S/p DES to LCx x 2 in 12/2020 // Diff dz in small to mod Dx (not a good target for PCI); OM1 100 CTO; severe dz in small OM2 and PDA >> Med Rx   Depression    Diabetes mellitus without complication (HCC)    HLD (hyperlipidemia)    Hypertension    SBO (small bowel obstruction) (HCC)     Past Surgical History:  Procedure Laterality Date   BACK SURGERY     CARDIAC CATHETERIZATION     CATARACT EXTRACTION W/ INTRAOCULAR LENS  IMPLANT, BILATERAL     CHOLECYSTECTOMY     CHOLECYSTECTOMY, LAPAROSCOPIC     CORONARY STENT INTERVENTION N/A  08/15/2020   Procedure: CORONARY STENT INTERVENTION;  Surgeon: Corky Crafts, MD;  Location: [facility] INVASIVE CV LAB;  Service: Cardiovascular;  Laterality: N/A;   CORONARY STENT INTERVENTION N/A 01/17/2021   Procedure: CORONARY STENT INTERVENTION;  Surgeon: Kathleene Hazel, MD;  Location: [facility] INVASIVE CV LAB;  Service: Cardiovascular;  Laterality: N/A;   EYE SURGERY     INTRAVASCULAR ULTRASOUND/IVUS N/A 08/15/2020   Procedure: Intravascular Ultrasound/IVUS;  Surgeon: Corky Crafts, MD;  Location: [facility] INVASIVE CV LAB;  Service: Cardiovascular;  Laterality: N/A;   LEFT HEART CATH AND CORONARY ANGIOGRAPHY N/A 08/15/2020   Procedure: LEFT HEART CATH AND CORONARY ANGIOGRAPHY;  Surgeon: Corky Crafts, MD;  Location: [facility] INVASIVE CV LAB;  Service: Cardiovascular;  Laterality: N/A;   LEFT HEART CATH AND CORONARY ANGIOGRAPHY N/A 01/17/2021   Procedure: LEFT HEART CATH AND CORONARY ANGIOGRAPHY;  Surgeon: Kathleene Hazel, MD;  Location: [facility] INVASIVE CV LAB;  Service: Cardiovascular;  Laterality: N/A;   LUMBAR FUSION     L3-5 with rods   XI ROBOTIC ASSISTED INGUINAL HERNIA REPAIR WITH MESH     x 3 surgeries    Social History   Socioeconomic History   Marital status: Married    Spouse name: Not on file   Number of children:  4   Years of education: 77   Highest education level: Not on file  Occupational History   Occupation: disabled  Tobacco Use   Smoking status: Former    Types: Cigarettes    Quit date: 2007    Years since quitting: 16.2   Smokeless tobacco: Never  Vaping Use   Vaping Use: Never used  Substance and Sexual Activity   Alcohol use: Not Currently   Drug use: Not Currently   Sexual activity: Yes  Other Topics Concern   Not on file  Social History Narrative   Not on file   Social Determinants of Health   Financial Resource Strain: Not on file  Food Insecurity: Not on file  Transportation Needs: Not on file  Physical Activity: Not on file   Stress: Not on file  Social Connections: Not on file  Intimate Partner Violence: Not on file    Current Outpatient Medications on File Prior to Visit  Medication Sig Dispense Refill   albuterol (VENTOLIN HFA) 108 (90 Base) MCG/ACT inhaler Inhale 2 puffs by mouth  into the lungs every 6 (six) hours as needed for wheezing. 17 g 11   ARIPiprazole (ABILIFY) 10 MG tablet Take 1 tablet (10 mg total) by mouth every morning. 90 tablet 1   aspirin 81 MG EC tablet Take 81 mg by mouth every morning.     atorvastatin (LIPITOR) 80 MG tablet TAKE 1 TABLET (80 MG TOTAL) BY MOUTH DAILY. (Patient taking differently: Take 80 mg by mouth daily.) 90 tablet 3   b complex vitamins capsule Take 1 capsule by mouth daily.     buprenorphine (BUTRANS) 20 MCG/HR PTWK Place 1 patch onto the skin once a week. 4 patch 3   buPROPion (WELLBUTRIN XL) 300 MG 24 hr tablet Take 1 tablet (300 mg total) by mouth daily. 90 tablet 0   clopidogrel (PLAVIX) 75 MG tablet TAKE 1 TABLET (75 MG TOTAL) BY MOUTH DAILY WITH BREAKFAST. (Patient taking differently: Take 75 mg by mouth daily with breakfast.) 90 tablet 3   Continuous Blood Gluc Sensor (DEXCOM G6 SENSOR) MISC Use as directed. Change sensor every 10 days 9 each 3   COVID-19 At Home Antigen Test (CARESTART COVID-19 HOME TEST) KIT Use as directed 2 kit 0   empagliflozin (JARDIANCE) 25 MG TABS tablet Take 1 tablet (25 mg total) by mouth daily. 90 tablet 3   insulin lispro (HUMALOG) 100 UNIT/ML injection For use in pump, total of 60 units per day 60 mL 3   Iron, Ferrous Sulfate, 325 (65 Fe) MG TABS Take 325 mg by mouth daily. 90 tablet 1   isosorbide mononitrate (IMDUR) 30 MG 24 hr tablet Take 1 tablet (30 mg total) by mouth daily. 90 tablet 2   lidocaine (LIDODERM) 5 % Apply 1 - 3 patches on to the skin as directed. 12 hours on and 12 hours off. 90 patch 2   metoCLOPramide (REGLAN) 10 MG tablet Take 10 mg by mouth daily as needed for nausea or vomiting (upset stomach).      metoprolol succinate (TOPROL XL) 25 MG 24 hr tablet Take 1 tablet (25 mg total) by mouth daily. 90 tablet 2   ondansetron (ZOFRAN-ODT) 4 MG disintegrating tablet Take 1 tablet (4 mg total) by mouth every 8 (eight) hours as needed for nausea or vomiting. 20 tablet 0   pantoprazole (PROTONIX) 40 MG tablet Take 1 tablet (40 mg total) by mouth daily. 90 tablet 0   Semaglutide, 2 MG/DOSE, (OZEMPIC, 2  MG/DOSE,) 8 MG/3ML SOPN Inject 2 mg into the skin once a week. 9 mL 3   tamsulosin (FLOMAX) 0.4 MG CAPS capsule Take 1 capsule (0.4 mg total) by mouth daily. 90 capsule 1   testosterone (ANDROGEL) 50 MG/5GM (1%) GEL Place 5 g (1 packet) onto the skin daily. Apply to shoulders and upper arms 150 g 3   tiZANidine (ZANAFLEX) 4 MG tablet Take 2 tablets (8 mg total) by mouth every 8 (eight) hours as needed for muscle spasms. 180 tablet    Insulin Asp Prot & Asp FlexPen (NOVOLOG 70/30 MIX) (70-30) 100 UNIT/ML FlexPen Inject 24 Units into the skin 3 (three) times daily as needed (has dexcom, checks before dose).     No current facility-administered medications on file prior to visit.    Allergies  Allergen Reactions   Kiwi Extract Swelling    Mouth swelling.    Other Swelling and Other (See Comments)    EGGPLANT. Mouth swelling.   Penicillins Rash    Reaction: unknown   Peach [Prunus Persica] Other (See Comments)    Burns mouth   Ancef [Cefazolin] Rash   Latex Rash   Levaquin [Levofloxacin] Rash    Family History  Problem Relation Age of Onset   COPD Mother    Anxiety disorder Mother    Depression Mother    Cancer Father    Anxiety disorder Father    Depression Father    Other Father    Diabetes Neg Hx     BP 106/70 (BP Location: Left Arm, Patient Position: Sitting, Cuff Size: Normal)   Pulse 85   Ht 5\' 9"  (1.753 m)   Wt 192 lb 3.2 oz (87.2 kg)   SpO2 96%   BMI 28.38 kg/m    Review of Systems     Objective:   Physical Exam    Lab Results  Component Value Date   HGBA1C 6.3  (H) 11/01/2021      Assessment & Plan:  Insulin-requiring type 2 DM: overcontrolled.   Patient Instructions  Please take these pump settings:   basal rate of 0.05 units/hr (when not in Control-IQ).  bolus of 1 unit/5 grams carbohydrate.   correction bolus (which some people call "sensitivity," or "insulin sensitivity ratio," or just "isr") of 1 unit for each 100 by which your glucose exceeds 120.   I am asking Bonita Quin if she can help with getting knocked off Control-IQ.   Please come back for a follow-up appointment in 2-3 months.

## 2021-11-21 NOTE — Patient Instructions (Addendum)
Please take these pump settings:   ?basal rate of 0.05 units/hr (when not in Control-IQ).  ?bolus of 1 unit/5 grams carbohydrate.   ?correction bolus (which some people call "sensitivity," or "insulin sensitivity ratio," or just "isr") of 1 unit for each 100 by which your glucose exceeds 120.   ?I am asking Brandon [name] if she can help with getting knocked off Control-IQ.   ?Please come back for a follow-up appointment in 2-3 months.   ? ?

## 2021-11-22 ENCOUNTER — Encounter: Payer: Self-pay | Admitting: Family Medicine

## 2021-11-22 ENCOUNTER — Other Ambulatory Visit: Payer: Self-pay | Admitting: Family Medicine

## 2021-11-22 ENCOUNTER — Ambulatory Visit (INDEPENDENT_AMBULATORY_CARE_PROVIDER_SITE_OTHER): Payer: No Typology Code available for payment source | Admitting: Family Medicine

## 2021-11-22 VITALS — BP 131/74 | HR 75 | Ht 69.0 in | Wt 191.0 lb

## 2021-11-22 DIAGNOSIS — E291 Testicular hypofunction: Secondary | ICD-10-CM

## 2021-11-22 DIAGNOSIS — J189 Pneumonia, unspecified organism: Secondary | ICD-10-CM | POA: Diagnosis not present

## 2021-11-22 DIAGNOSIS — I10 Essential (primary) hypertension: Secondary | ICD-10-CM

## 2021-11-22 DIAGNOSIS — E1165 Type 2 diabetes mellitus with hyperglycemia: Secondary | ICD-10-CM | POA: Diagnosis not present

## 2021-11-22 DIAGNOSIS — Z794 Long term (current) use of insulin: Secondary | ICD-10-CM

## 2021-11-22 NOTE — Assessment & Plan Note (Signed)
Completed course of antibiotics.  Doing well at this time.  We will plan to repeat CT scan in approximately 4 weeks.  Repeat CMP and CBC. ?

## 2021-11-22 NOTE — Therapy (Signed)
[facility] [address] [place], [place], [address] Phone: [phone]   Fax:  [phone]  Occupational Therapy Treatment  Patient Details  Name: Brandon [name] MRN: [id number] Date of Birth: 1956-04-15 Referring Provider (OT): Burnadette Pop, MD   Encounter Date: 11/20/2021    OT End of Session - 11/20/21 1534     Visit Number 2   Number of Visits 13   Date for OT Re-Evaluation 02/10/2022   Authorization Type [employer]   Authorization - Visit Number 2   Authorization - Number of Visits 25    OT   Progress Note Due on Visit 10   OT Start Time 1530   OT Stop Time 1615   OT Time Calculation (min) 45 min   Activity Tolerance Patient tolerated treatment well   Behavior During Therapy [facility] for tasks assessed/performed;Flat affect           Past Medical History:  Diagnosis Date   Anxiety    Basal cell carcinoma (BCC) in situ of skin    Colon polyps    Coronary artery disease    S/p DES to mRCA in 12/21 // S/p DES to LCx x 2 in 12/2020 // Diff dz in small to mod Dx (not a good target for PCI); OM1 100 CTO; severe dz in small OM2 and PDA >> Med Rx   Depression    Diabetes mellitus without complication (HCC)    HLD (hyperlipidemia)    Hypertension    SBO (small bowel obstruction) (HCC)     Past Surgical History:  Procedure Laterality Date   BACK SURGERY     CARDIAC CATHETERIZATION     CATARACT EXTRACTION W/ INTRAOCULAR LENS  IMPLANT, BILATERAL     CHOLECYSTECTOMY     CHOLECYSTECTOMY, LAPAROSCOPIC     CORONARY STENT INTERVENTION N/A 08/15/2020   Procedure: CORONARY STENT INTERVENTION;  Surgeon: Corky Crafts, MD;  [facility]: [facility] IN[facility];  Service: Cardiovascular;  Laterality: N/A;   CORONARY STENT INTERVENTION N/A 01/17/2021   Procedure: CORONARY STENT INTERVENTION;  Surgeon: Kathleene Hazel, MD;  Location: [facility] INV[facility];  Service: Cardiovascular;  Laterality: N/A;   EYE SURGERY      INTRAVASCULAR ULTRASOUND/IVUS N/A 08/15/2020   Procedure: Intravascular Ultrasound/IVUS;  Surgeon: Corky Crafts, MD;  Location: [facility] INVASIVE CV LAB;  Service: Cardiovascular;  Laterality: N/A;   LEFT HEART CATH AND CORONARY ANGIOGRAPHY N/A 08/15/2020   Procedure: LEFT HEART CATH AND CORONARY ANGIOGRAPHY;  Surgeon: Corky Crafts, MD;  [facility]: [facility] INVASIVE CV LAB;  Service: Cardiovascular;  Laterality: N/A;   LEFT HEART CATH AND CORONARY ANGIOGRAPHY N/A 01/17/2021   Procedure: LEFT HEART CATH AND CORONARY ANGIOGRAPHY;  Surgeon: Kathleene Hazel, MD;  [facility]: [facility];  Service: Cardiovascular;  Laterality: N/A;   LUMBAR FUSION     L3-5 with rods   XI ROBOTIC ASSISTED INGUINAL HERNIA REPAIR WITH MESH     x 3 surgeries    There were no vitals filed for this visit.    Subjective Assessment - 11/20/21 1534     Subjective Pt reports he has previously completed a lot of aquatic therapy and is hopefully he will be released to return to his gym and pool activities by MD at upcoming follow-up appt   Pertinent History Episode of transient altered mental status (AMS), suspected encephalopathy in the setting of infection; PMH of multivessel CAD s/p multiple cardiac stents, unstable angina, chronic pain, HTN,  T2DM, HLD, OSA on bipap, vertigo, and lumbar sacral spinal stenosis s/p L3-5 lumbar fusion w/ spinal cord stimulator for chronic LBP   Patient Stated Goals Improve balance; to not fall   Currently in Pain? Yes   Pain Score 4   Pain Location Back   Pain Orientation Right;Left    describes pain as a "band" around his lower back   Pain Descriptors / Indicators Aching;Dull   Pain Type Chronic pain            OT Treatment    Energy Conservation Strategies Education provided on energy conservation strategies (e.g., planning, pacing, prioritizing, and positioning), problem-solving w/ pt to identify pt-specific examples of how to incorporate strategies into ADLs; pt  verbalized understanding and corresponding handout administered to pt    Fall Prevention Education provided regarding fall prevention strategies, including appropriate home modifications prn (e.g., lighting, grab bars/handrails, throw rugs, etc.); pt verbalized understanding and corresponding fall prevention checklist handout provided to pt    AE: Long-Handle Sponge Education provided on potential benefit of long-handle sponge for use during bathing activities to increase safety w/ LB bathing; OT provided demonstration and printed handout of options for self-purchase at conclusion of session    Balance Exercises 2 sets of high-knee marching 5 ft forward and backward w/ RUE countertop support at [facility]. OT provided additional cues to increase hip flexion while stepping w/ LLE due to decreased foot clearance, particularly w/ fatigue. Pt able to complete exercise w/out increased pain or significant LOB. Rest break taken between sets.            OT Short Term Goals - 11/20/21 1535       OT SHORT TERM GOAL #1   Title Pt will verbalize understanding of at least 2 energy conservation stategies    Baseline Decreased activity tolerance/endurance    Time 3    Period Weeks    Status Achieved   11/20/21   Target Date 12/07/21      OT SHORT TERM GOAL #2   Title Pt will demonstrate independence w/ long-handled sponge to improve participation in bathing tasks    Time 3    Period Weeks    Status Achieved   11/20/21   Target Date 12/07/21      OT SHORT TERM GOAL #3   Title Pt will verbalize understanding of environmental modifications related to fall prevention    Baseline Repeated falls    Time 3    Period Weeks    Status Achieved   11/20/21   Target Date 12/07/21             OT Long Term Goals - 11/20/21 1625       OT LONG TERM GOAL #1   Title Pt will improve TUG time to indicate improved safety w/ functional mobility    Baseline 27 sec    Time 6    Period Weeks    Status On-going     Target Date 12/28/21      OT LONG TERM GOAL #2   Title Pt will be able to safely ambulate a specified distance (TBA), using assistive device prn, w/out report of increased pain or fatigue for improved in-home mobility    Baseline TBA    Time 6    Period Weeks    Status On-going    Target Date 12/28/21      OT LONG TERM GOAL #3   Title Pt will be able to complete a simulated functional task  in standing for at least 5 min while demonstrating understanding of energy conservation techniques/good body mechanics    Time 6    Period Weeks    Status On-going    Target Date 12/28/21             Plan - 11/20/21 1626    Clinical Impression Statement Pt returns for initial treatment session after evaluation completed 11/12/21; initial PT evaluation to follow OT session today. OT reviewed therapy goals w/ pt and introduced both energy conservation techniques and fall prevention strategies (including simple home modifications prn) considering chronic pain, limitations w/ balance and mobility, and history of falls. OT also discussed potential benefit of long-handled sponge for bathing, particularly to decrease pt's need to bend over at the waist to complete LB bathing while showering as pt reports increased dizziness w/ forward trunk flexion. Pt will continue to benefit from OP therapies to address functional mobility and increased safety; OT to discuss continued POC w/ PT after evaluation session following OT session today.   OT Occupational Profile and History Detailed Assessment- Review of Records and additional review of physical, cognitive, psychosocial history related to current functional performance   Occupational performance deficits (Please refer to evaluation for details): ADL's; IADL's; Leisure; Social Participation   Body Structure / Function / Physical Skills ADL; Decreased knowledge of use of DME; Gait; Strength; Balance; GMC; Body mechanics; Endurance; Cardiopulmonary status limiting  activity; IADL; Mobility   Rehab Potential Fair   Clinical Decision Making Several treatment options, min-mod task modification necessary   Comorbidities Affecting Occupational Performance: Presence of comorbidities impacting occupational performance   Modification or Assistance to Complete Evaluation Min-Moderate modification of tasks or assist with assess necessary to complete eval   OT Frequency 2x / week   OT Duration 6 weeks   OT Treatment/Interventions Self-care/ADL training; DME and/or AE instruction; Balance training; Aquatic Therapy; Therapeutic activities; Therapeutic exercise; Building services engineer; Patient/family education; Energy conservation   Plan Discuss POC w/ PT after evaluation to determine continued necessity of skilled OT services; potential d/c. Practice energy conservation strategies during simulated IADL task and continue w/ balance activities/exercises (functional mobility looking for targets, ambulation while carrying objects)   Consulted and Agree with Plan of Care Patient           Patient will benefit from skilled therapeutic intervention in order to improve the following deficits and impairments:   Body Structure / Function / Physical Skills: ADL, Decreased knowledge of use of DME, Gait, Strength, Balance, GMC, Body mechanics, Endurance, Cardiopulmonary status limiting activity, IADL, Mobility   Visit Diagnosis: History of falling  Bilateral low back pain without sciatica, unspecified chronicity  Muscle weakness (generalized)   Problem List Patient Active Problem List   Diagnosis Date Noted   AMS (altered mental status) 11/01/2021   Type 2 diabetes mellitus (HCC) 11/01/2021   Community acquired pneumonia 10/31/2021   Coughing 10/29/2021   Pain management contract signed 10/11/2021   History of lumbar fusion (L4/5) 10/02/2021   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   Chronic pain syndrome 06/01/2021   OSA (obstructive sleep apnea)  05/28/2021   Thrush 05/21/2021   Metallic taste 05/21/2021   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   Acute pain of left shoulder 04/03/2021   Unstable angina (HCC)    Vertigo 11/28/2020   Myelopathy (HCC) 11/28/2020   Insomnia 09/03/2020   Status post coronary artery stent placement    Mixed hyperlipidemia    Coronary artery disease    Chest pain 08/11/2020  Lumbar facet arthropathy 06/22/2020   Major depression in partial remission (HCC) 06/04/2020   Hyperlipidemia associated with type 2 diabetes mellitus (HCC) 04/14/2020   Primary hypertension 04/07/2020   Type 2 diabetes mellitus with other specified complication (HCC) 04/07/2020   Sepsis (HCC) 10/06/2019   Spinal cord stimulator status 05/19/2017   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   Erectile dysfunction 10/09/2015    Rosie Fate, MSOT, OTR/L 11/20/2021, 5:13 PM  [facility] [address] [place], [place], [address] Phone: [phone]   Fax:  [phone]  Name: Brandon [name] MRN: [id number] Date of Birth: [birth year]

## 2021-11-22 NOTE — Assessment & Plan Note (Signed)
Blood sugars remain well controlled with current medications.  Recommend continuation ?

## 2021-11-22 NOTE — Assessment & Plan Note (Signed)
Blood pressure is well-controlled at this time.  Recommend continuation of current medications for management of hypertension.   

## 2021-11-22 NOTE — Progress Notes (Signed)
?Brandon [name] - 66 y.o. male MRN [id number]  Date of birth: [birth year] ? ?Subjective ?Chief Complaint  ?Patient presents with  ? Hospitalization Follow-up  ? ? ?HPI ?Brandon [name] is a 66 year old male here today for hospital follow-up.  He was admitted to the hospital for altered mental status and found to have patchy consolidation bilateral lungs consistent with pneumonia.  He was treated with antibiotics with improvement in his mental status.  Evaluation for stroke was negative, however he was unable to have MRI due to sedation requirement.  He was evaluated by PT and OT.  He has continued physical therapy since discharge.  Reports he is feeling much better and denies any continued respiratory symptoms.  His wife reports that he has been more sleepy recently.  He is on chronic opiate therapy now. ? ?Blood sugars at home have remained well controlled with current medications. ? ?ROS:  A comprehensive ROS was completed and negative except as noted per HPI ? ?Allergies  ?Allergen Reactions  ? Kiwi Extract Swelling  ?  Mouth swelling. ?  ? Other Swelling and Other (See Comments)  ?  EGGPLANT. Mouth swelling.  ? Penicillins Rash  ?  Reaction: unknown  ? Peach [Prunus Persica] Other (See Comments)  ?  Burns mouth  ? Ancef [Cefazolin] Rash  ? Latex Rash  ? Levaquin [Levofloxacin] Rash  ? ? ?Past Medical History:  ?Diagnosis Date  ? Anxiety   ? Basal cell carcinoma (BCC) in situ of skin   ? Colon polyps   ? Coronary artery disease   ? S/p DES to mRCA in 12/21 // S/p DES to LCx x 2 in 12/2020 // Diff dz in small to mod Dx (not a good target for PCI); OM1 100 CTO; severe dz in small OM2 and PDA >> Med Rx  ? Depression   ? Diabetes mellitus without complication (HCC)   ? HLD (hyperlipidemia)   ? Hypertension   ? SBO (small bowel obstruction) (HCC)   ? ? ?Past Surgical History:  ?Procedure Laterality Date  ? BACK SURGERY    ? CARDIAC CATHETERIZATION    ? CATARACT EXTRACTION W/ INTRAOCULAR LENS  IMPLANT, BILATERAL    ?  CHOLECYSTECTOMY    ? CHOLECYSTECTOMY, LAPAROSCOPIC    ? CORONARY STENT INTERVENTION N/A 08/15/2020  ? Procedure: CORONARY STENT INTERVENTION;  Surgeon: Corky Crafts, MD;  Location: [facility] INVASIVE CV LAB;  Service: Cardiovascular;  Laterality: N/A;  ? CORONARY STENT INTERVENTION N/A 01/17/2021  ? Procedure: CORONARY STENT INTERVENTION;  Surgeon: Kathleene Hazel, MD;  Location: [facility] INVASIVE CV LAB;  Service: Cardiovascular;  Laterality: N/A;  ? EYE SURGERY    ? INTRAVASCULAR ULTRASOUND/IVUS N/A 08/15/2020  ? Procedure: Intravascular Ultrasound/IVUS;  Surgeon: Corky Crafts, MD;  Location[facility] INVASIVE CV LAB;  Service: Cardiovascular;  Laterality: N/A;  ? LEFT HEART CATH AND CORONARY ANGIOGRAPHY N/A 08/15/2020  ? Procedure: LEFT HEART CATH AND CORONARY ANGIOGRAPHY;  Surgeon: Corky Crafts, MD;  Location: [facility] INVASIVE CV LAB;  Service: Cardiovascular;  Laterality: N/A;  ? LEFT HEART CATH AND CORONARY ANGIOGRAPHY N/A 01/17/2021  ? Procedure: LEFT HEART CATH AND CORONARY ANGIOGRAPHY;  Surgeon: Kathleene Hazel, MD;  Location: [facility] INVASIVE CV LAB;  Service: Cardiovascular;  Laterality: N/A;  ? LUMBAR FUSION    ? L3-5 with rods  ? XI ROBOTIC ASSISTED INGUINAL HERNIA REPAIR WITH MESH    ? x 3 surgeries  ? ? ?Social History  ? ?Socioeconomic History  ? Marital status: Married  ?  Spouse name: Not on file  ? Number of children: 4  ? Years of education: 16  ? Highest education level: Not on file  ?Occupational History  ? Occupation: disabled  ?Tobacco Use  ? Smoking status: Former  ?  Types: Cigarettes  ?  Quit date: 2007  ?  Years since quitting: 16.2  ? Smokeless tobacco: Never  ?Vaping Use  ? Vaping Use: Never used  ?Substance and Sexual Activity  ? Alcohol use: Not Currently  ? Drug use: Not Currently  ? Sexual activity: Yes  ?Other Topics Concern  ? Not on file  ?Social History Narrative  ? Not on file  ? ?Social Determinants of Health  ? ?Financial Resource Strain: Not on file  ?Food  Insecurity: Not on file  ?Transportation Needs: Not on file  ?Physical Activity: Not on file  ?Stress: Not on file  ?Social Connections: Not on file  ? ? ?Family History  ?Problem Relation Age of Onset  ? COPD Mother   ? Anxiety disorder Mother   ? Depression Mother   ? Cancer Father   ? Anxiety disorder Father   ? Depression Father   ? Other Father   ? Diabetes Neg Hx   ? ? ?Health Maintenance  ?Topic Date Due  ? URINE MICROALBUMIN  Never done  ? Hepatitis C Screening  Never done  ? TETANUS/TDAP  Never done  ? COLONOSCOPY (Pts 45-34yrs Insurance coverage will need to be confirmed)  Never done  ? Zoster Vaccines- Shingrix (1 of 2) Never done  ? COVID-19 Vaccine (3 - Booster for Pfizer series) 08/11/2020  ? OPHTHALMOLOGY EXAM  04/24/2022  ? HEMOGLOBIN A1C  05/04/2022  ? FOOT EXAM  05/29/2022  ? Pneumonia Vaccine 40+ Years old  Completed  ? INFLUENZA VACCINE  Completed  ? HPV VACCINES  Aged Out  ? ? ? ?----------------------------------------------------------------------------------------------------------------------------------------------------------------------------------------------------------------- ?Physical Exam ?BP 131/74 (BP Location: Right Arm, Patient Position: Sitting, Cuff Size: Normal)   Pulse 75   Ht 5\' 9"  (1.753 m)   Wt 191 lb (86.6 kg)   SpO2 97%   BMI 28.21 kg/m?  ? ?Physical Exam ?Constitutional:   ?   Appearance: Normal appearance.  ?Eyes:  ?   General: No scleral icterus. ?Cardiovascular:  ?   Rate and Rhythm: Normal rate and regular rhythm.  ?Musculoskeletal:  ?   Cervical back: Neck supple.  ?Neurological:  ?   General: No focal deficit present.  ?   Mental Status: He is alert.  ?Psychiatric:     ?   Mood and Affect: Mood normal.     ?   Behavior: Behavior normal.   ? ? ?------------------------------------------------------------------------------------------------------------------------------------------------------------------------------------------------------------------- ?Assessment and Plan ? ?Male hypogonadism ?He has some increased fatigue since starting chronic opioids.  Use of chronic opioids may further lower testosterone, will recheck testosterone levels. ? ?Primary hypertension ?Blood pressure is well controlled at this time.  Recommend continuation of current medications for management of hypertension. ? ?Community acquired pneumonia ?Completed course of antibiotics.  Doing well at this time.  We will plan to repeat CT scan in approximately 4 weeks.  Repeat CMP and CBC. ? ?Type 2 diabetes mellitus (HCC) ? Blood sugars remain well controlled with current medications.  Recommend continuation ? ? ?No orders of the defined types were placed in this encounter. ? ? ?Return in about 4 months (around 03/24/2022) for F/u HTN/DM. ? ? ? ?This visit occurred during the SARS-CoV-2 public health emergency.  Safety protocols were in place, including screening  questions prior to the visit, additional usage of staff PPE, and extensive cleaning of exam room while observing appropriate contact time as indicated for disinfecting solutions.  ? ?

## 2021-11-22 NOTE — Assessment & Plan Note (Signed)
He has some increased fatigue since starting chronic opioids.  Use of chronic opioids may further lower testosterone, will recheck testosterone levels. ?

## 2021-11-23 ENCOUNTER — Other Ambulatory Visit (HOSPITAL_BASED_OUTPATIENT_CLINIC_OR_DEPARTMENT_OTHER): Payer: Self-pay

## 2021-11-23 ENCOUNTER — Other Ambulatory Visit (HOSPITAL_COMMUNITY): Payer: Self-pay

## 2021-11-23 LAB — CBC WITH DIFFERENTIAL/PLATELET
Absolute Monocytes: 604 cells/uL (ref 200–950)
Basophils Absolute: 32 cells/uL (ref 0–200)
Basophils Relative: 0.6 %
Eosinophils Absolute: 80 cells/uL (ref 15–500)
Eosinophils Relative: 1.5 %
HCT: 47.2 % (ref 38.5–50.0)
Hemoglobin: 15.2 g/dL (ref 13.2–17.1)
Lymphs Abs: 1124 cells/uL (ref 850–3900)
MCH: 28.7 pg (ref 27.0–33.0)
MCHC: 32.2 g/dL (ref 32.0–36.0)
MCV: 89.1 fL (ref 80.0–100.0)
MPV: 10.7 fL (ref 7.5–12.5)
Monocytes Relative: 11.4 %
Neutro Abs: 3461 cells/uL (ref 1500–7800)
Neutrophils Relative %: 65.3 %
Platelets: 255 10*3/uL (ref 140–400)
RBC: 5.3 10*6/uL (ref 4.20–5.80)
RDW: 18.7 % — ABNORMAL HIGH (ref 11.0–15.0)
Total Lymphocyte: 21.2 %
WBC: 5.3 10*3/uL (ref 3.8–10.8)

## 2021-11-23 LAB — COMPLETE METABOLIC PANEL WITH GFR
AG Ratio: 1.2 (calc) (ref 1.0–2.5)
ALT: 13 U/L (ref 9–46)
AST: 24 U/L (ref 10–35)
Albumin: 4.2 g/dL (ref 3.6–5.1)
Alkaline phosphatase (APISO): 72 U/L (ref 35–144)
BUN: 7 mg/dL (ref 7–25)
CO2: 28 mmol/L (ref 20–32)
Calcium: 9.4 mg/dL (ref 8.6–10.3)
Chloride: 103 mmol/L (ref 98–110)
Creat: 0.73 mg/dL (ref 0.70–1.35)
Globulin: 3.5 g/dL (calc) (ref 1.9–3.7)
Glucose, Bld: 72 mg/dL (ref 65–99)
Potassium: 4.4 mmol/L (ref 3.5–5.3)
Sodium: 142 mmol/L (ref 135–146)
Total Bilirubin: 0.7 mg/dL (ref 0.2–1.2)
Total Protein: 7.7 g/dL (ref 6.1–8.1)
eGFR: 100 mL/min/{1.73_m2} (ref >=60)

## 2021-11-23 LAB — TESTOSTERONE: Testosterone: 470 ng/dL (ref 250–827)

## 2021-11-24 ENCOUNTER — Other Ambulatory Visit (HOSPITAL_BASED_OUTPATIENT_CLINIC_OR_DEPARTMENT_OTHER): Payer: Self-pay

## 2021-11-24 ENCOUNTER — Other Ambulatory Visit: Payer: Self-pay | Admitting: Family Medicine

## 2021-11-26 ENCOUNTER — Telehealth: Payer: Self-pay | Admitting: Pharmacy Technician

## 2021-11-26 ENCOUNTER — Other Ambulatory Visit (HOSPITAL_BASED_OUTPATIENT_CLINIC_OR_DEPARTMENT_OTHER): Payer: Self-pay

## 2021-11-26 NOTE — Telephone Encounter (Signed)
Patient Advocate Encounter ? ?Received notification from [employer] that prior authorization for [facility] G6 SENSOR is required. ?  ?PA submitted on 4.3.23 ?Key [id number]  ?Status is pending ?  ?[facility] will continue to follow ? ?Maryama Kuriakose R Ebba Goll, CPhT ?Patient Advocate ?[facility] ?Phone: [phone] ?Fax:  [phone] ? ?

## 2021-11-27 ENCOUNTER — Other Ambulatory Visit (HOSPITAL_COMMUNITY): Payer: Self-pay

## 2021-11-27 ENCOUNTER — Other Ambulatory Visit (HOSPITAL_BASED_OUTPATIENT_CLINIC_OR_DEPARTMENT_OTHER): Payer: Self-pay

## 2021-11-27 ENCOUNTER — Encounter: Payer: Self-pay | Admitting: Physical Therapy

## 2021-11-27 ENCOUNTER — Encounter: Payer: Self-pay | Admitting: Occupational Therapy

## 2021-11-27 ENCOUNTER — Ambulatory Visit: Payer: No Typology Code available for payment source | Admitting: Occupational Therapy

## 2021-11-27 ENCOUNTER — Ambulatory Visit: Payer: No Typology Code available for payment source | Attending: Internal Medicine | Admitting: Physical Therapy

## 2021-11-27 DIAGNOSIS — R2689 Other abnormalities of gait and mobility: Secondary | ICD-10-CM | POA: Insufficient documentation

## 2021-11-27 DIAGNOSIS — M6281 Muscle weakness (generalized): Secondary | ICD-10-CM

## 2021-11-27 DIAGNOSIS — Z9181 History of falling: Secondary | ICD-10-CM | POA: Insufficient documentation

## 2021-11-27 DIAGNOSIS — R2681 Unsteadiness on feet: Secondary | ICD-10-CM | POA: Insufficient documentation

## 2021-11-27 DIAGNOSIS — M545 Low back pain, unspecified: Secondary | ICD-10-CM | POA: Insufficient documentation

## 2021-11-27 MED ORDER — TESTOSTERONE 50 MG/5GM (1%) TD GEL
5.0000 g | Freq: Every day | TRANSDERMAL | 0 refills | Status: DC
  Filled 2021-11-27: qty 150, 30d supply, fill #0

## 2021-11-27 NOTE — Telephone Encounter (Signed)
Received notification from [facility] regarding a prior authorization for [employer] G6 RECEIVER/TRANSMITTER/SENSOR. Authorization has been APPROVED from 04.04.23 to 04.03.24.  ? ? ?Authorization # PA Case ID: [id number] ? ? ?

## 2021-11-27 NOTE — Therapy (Signed)
[facility] [place] [facility] [address] [place], [place], [address] Phone: [phone]   Fax:  [phone]  Physical Therapy Treatment  Patient Details  Name: Brandon [name] MRN: [id number] Date of Birth: [birth year] Referring Provider (PT): Adhikari   Encounter Date: 11/27/2021   PT End of Session - 11/27/21 1014     Visit Number 2    Number of Visits 13    Date for PT Re-Evaluation 01/01/22    Authorization Type [employer]    Authorization Time Period 11/20/21 to 01/01/22    Authorization - Number of Visits 25    PT Start Time 0933    PT Stop Time 1013    PT Time Calculation (min) 40 min    Activity Tolerance Patient tolerated treatment well    Behavior During Therapy [facility] for tasks assessed/performed             Past Medical History:  Diagnosis Date   Anxiety    Basal cell carcinoma (BCC) in situ of skin    Colon polyps    Coronary artery disease    S/p DES to mRCA in 12/21 // S/p DES to LCx x 2 in 12/2020 // Diff dz in small to mod Dx (not a good target for PCI); OM1 100 CTO; severe dz in small OM2 and PDA >> Med Rx   Depression    Diabetes mellitus without complication (HCC)    HLD (hyperlipidemia)    Hypertension    SBO (small bowel obstruction) (HCC)     Past Surgical History:  Procedure Laterality Date   BACK SURGERY     CARDIAC CATHETERIZATION     CATARACT EXTRACTION W/ INTRAOCULAR LENS  IMPLANT, BILATERAL     CHOLECYSTECTOMY     CHOLECYSTECTOMY, LAPAROSCOPIC     CORONARY STENT INTERVENTION N/A 08/15/2020   Procedure: CORONARY STENT INTERVENTION;  Surgeon: Corky Crafts, MD;  [facility] IN[facility];  Service: Cardiovascular;  Laterality: N/A;   CORONARY STENT INTERVENTION N/A 01/17/2021   Procedure: CORONARY STENT INTERVENTION;  Surgeon: Kathleene Hazel, MD;  [facility]: [facility] INV[facility] CV LAB;  Service: Cardiovascular;  Laterality: N/A;   EYE SURGERY     INTRAVASCULAR ULTRASOUND/IVUS N/A  08/15/2020   Procedure: Intravascular Ultrasound/IVUS;  Surgeon: Corky Crafts, MD;  Location: [facility] INVASIVE CV LAB;  Service: Cardiovascular;  Laterality: N/A;   LEFT HEART CATH AND CORONARY ANGIOGRAPHY N/A 08/15/2020   Procedure: LEFT HEART CATH AND CORONARY ANGIOGRAPHY;  Surgeon: Corky Crafts, MD;  Location: [facility] INVASIVE CV LAB;  Service: Cardiovascular;  Laterality: N/A;   LEFT HEART CATH AND CORONARY ANGIOGRAPHY N/A 01/17/2021   Procedure: LEFT HEART CATH AND CORONARY ANGIOGRAPHY;  Surgeon: Kathleene Hazel, MD;  [facility];  Service: Cardiovascular;  Laterality: N/A;   LUMBAR FUSION     L3-5 with rods   XI ROBOTIC ASSISTED INGUINAL HERNIA REPAIR WITH MESH     x 3 surgeries    There were no vitals filed for this visit.   Subjective Assessment - 11/27/21 0938     Subjective I had a trip on the steps the other day, didn't fall but came close; the MD gave me clearance to return to the gym no specific precautions just as tolerated    Patient Stated Goals improve balance, be more stable on my feet    Currently in Pain? No/denies   just generally sore  [facility] Adult PT Treatment/Exercise - 11/27/21 0001       Exercises   Exercises Knee/Hip      Knee/Hip Exercises: Aerobic   Nustep Nustep level 4 x 6 minutes BLEs only      Knee/Hip Exercises: Seated   Long Arc Quad Both;1 set;10 reps    Long Arc Quad Limitations red TB    Hamstring Curl Both;1 set;10 reps    Hamstring Limitations red TB      Knee/Hip Exercises: Supine   Bridges Both;1 set;15 reps    Other Supine Knee/Hip Exercises supine clams red tb 1x15                 Balance Exercises - 11/27/21 0001       Balance Exercises: Standing   Tandem Stance Eyes open;Foam/compliant surface;3 reps;30 secs    Tandem Gait Forward;4 reps;Retro   in // bars   Sidestepping Foam/compliant support;4 reps   in // bars   Marching Foam/compliant  surface;20 reps    Heel Raises 20 reps    Toe Raise 20 reps                PT Education - 11/27/21 1014     Education Details HEP updates    Person(s) Educated Patient    Methods Explanation;Demonstration;Handout    Comprehension Verbalized understanding;Returned demonstration              PT Short Term Goals - 11/20/21 1806       PT SHORT TERM GOAL #1   Title Will be compliant with appropriate progressive  HEP    Time 3    Period Weeks    Status New    Target Date 12/11/21      PT SHORT TERM GOAL #2   Title Will be able to name 3 ways to prevent falls at home and in the community    Time 3    Period Weeks    Status New      PT SHORT TERM GOAL #3   Title Will be able to maintain SLS for 15 seconds B and tandem stance for 30 seconds B to show improved functional balance    Time 3    Period Weeks    Status New               PT Long Term Goals - 11/20/21 1808       PT LONG TERM GOAL #1   Title MMT to improve by at least 1 grade in all weak groups to show improved functional strength    Time 6    Period Weeks    Status New    Target Date 01/01/22      PT LONG TERM GOAL #2   Title Berg score to impove to 50/56 and DGI to improve to 18/24 to show reduced fall risk    Time 6    Period Weeks    Status New      PT LONG TERM GOAL #3   Title Will be able to get from kneel to stand with no more than min guard assistance to show improved strength and balance    Time 6    Period Weeks    Status New      PT LONG TERM GOAL #4   Title Will be independent with appropriate gym based exercise program to maintain functional gains    Time 6    Period Weeks    Status New  Plan - 11/27/21 1014     Clinical Impression Statement Brandon [name] arrives today feeling OK, sore as he almost had a fall on steps but caught himself; updated HEP, then focused on general strengthening and balance training today. Hips and BLEs are quite weak, since  MD gave him permission to return to the gym without restrictions I did encourage him to work with personal trainer on LAQ and hamstring curl machines, leg press, etc  to help build leg strength on his own as well. Will continue to progress as able.    Personal Factors and Comorbidities Fitness;Time since onset of injury/illness/exacerbation    Examination-Activity Limitations Locomotion Level;Transfers;Squat;Stairs;Stand    Examination-Participation Restrictions Community Activity;Shop;Yard Work    Conservation officer, historic buildings Stable/Uncomplicated    Clinical Decision Making Low    Rehab Potential Good    PT Frequency 2x / week    PT Duration 6 weeks    PT Treatment/Interventions ADLs/Self Care Home Management;DME Instruction;Gait training;Stair training;Functional mobility training;Therapeutic activities;Therapeutic exercise;Balance training;Neuromuscular re-education;Patient/family education;Manual techniques;Energy conservation;Taping    PT Next Visit Plan focus on strength, balance, functiona activity tolerance    PT Home Exercise Plan [email or web address]    Consulted and Agree with Plan of Care Patient             Patient will benefit from skilled therapeutic intervention in order to improve the following deficits and impairments:  Difficulty walking, Decreased coordination, Decreased activity tolerance, Decreased balance, Decreased mobility, Decreased strength  Visit Diagnosis: History of falling  Muscle weakness (generalized)  Bilateral low back pain without sciatica, unspecified chronicity  Unsteadiness on feet  Other abnormalities of gait and mobility     Problem List Patient Active Problem List   Diagnosis Date Noted   AMS (altered mental status) 11/01/2021   Type 2 diabetes mellitus (HCC) 11/01/2021   Community acquired pneumonia 10/31/2021   Coughing 10/29/2021   Pain management contract signed 10/11/2021   History of lumbar fusion (L4/5) 10/02/2021   Failed back  surgical syndrome 10/02/2021   Chronic pain syndrome 06/01/2021   OSA (obstructive sleep apnea) 05/28/2021   Thrush 05/21/2021   Metallic taste 05/21/2021   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   Acute pain of left shoulder 04/03/2021   Unstable angina (HCC)    Vertigo 11/28/2020   Insomnia 09/03/2020   Status post coronary artery stent placement    Mixed hyperlipidemia    Coronary artery disease    Chest pain 08/11/2020   Lumbar facet arthropathy 06/22/2020   Major depression in partial remission (HCC) 06/04/2020   Hyperlipidemia associated with type 2 diabetes mellitus (HCC) 04/14/2020   Primary hypertension 04/07/2020   Type 2 diabetes mellitus with other specified complication (HCC) 04/07/2020   Sepsis (HCC) 10/06/2019   Spinal cord stimulator status 05/19/2017   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   Erectile dysfunction 10/09/2015   Lerry Liner PT, DPT, PN2   Supplemental Physical Therapist [facility]       [facility] [address] [place], [place], [address] Phone: [phone]   Fax:  [phone]  Name: Brandon [name] MRN: [id number] Date of Birth: Sep 25, 1955

## 2021-11-27 NOTE — Therapy (Signed)
[facility] [place] [address] [place], [place], [address] Phone: [phone]   Fax:  [phone]  Occupational Therapy Treatment & Discharge Summary  Patient Details  Name: Brandon [name] MRN: [id number] Date of Birth: 05-Oct-1955 Referring Provider (OT): Burnadette Pop, MD   Encounter Date: 11/27/2021   OT End of Session - 11/27/21 1028     Visit Number 3    Number of Visits 13    Date for OT Re-Evaluation 02/10/22    Authorization Type [employer]    Authorization - Visit Number 3    Authorization - Number of Visits 25   OT   Progress Note Due on Visit 10    OT Start Time 1023    OT Stop Time 1051    OT Time Calculation (min) 28 min    Activity Tolerance Patient tolerated treatment well    Behavior During Therapy [facility] for tasks assessed/performed;Flat affect            OCCUPATIONAL THERAPY DISCHARGE SUMMARY  Visits from Start of Care: 3  Current functional level related to goals / functional outcomes: Pt reports he is Mod I w/ all ADLs at this time w/ a good understanding of compensatory strategies, including AE, is able to safely complete functional mobility [facility] for in-home mobility, and tolerate at least 5 minutes of standing   Remaining deficits: Decreased balance/functional mobility (being addressed in PT)   Education / Equipment: Energy conservation; fall prevention, including home modifications; DME and AE (BSC and/or toilet frame handles, reacher, long-handle sponge)   Patient agrees to discharge. Patient goals were partially met. Patient is being discharged due to  being pleased w/ current functional level and comfortable w/ PT addressing balance and LB strength concerns.    Past Medical History:  Diagnosis Date   Anxiety    Basal cell carcinoma (BCC) in situ of skin    Colon polyps    Coronary artery disease    S/p DES to mRCA in 12/21 // S/p DES to LCx x 2 in 12/2020 // Diff dz in small to mod Dx  (not a good target for PCI); OM1 100 CTO; severe dz in small OM2 and PDA >> Med Rx   Depression    Diabetes mellitus without complication (HCC)    HLD (hyperlipidemia)    Hypertension    SBO (small bowel obstruction) (HCC)     Past Surgical History:  Procedure Laterality Date   BACK SURGERY     CARDIAC CATHETERIZATION     CATARACT EXTRACTION W/ INTRAOCULAR LENS  IMPLANT, BILATERAL     CHOLECYSTECTOMY     CHOLECYSTECTOMY, LAPAROSCOPIC     CORONARY STENT INTERVENTION N/A 08/15/2020   Procedure: CORONARY STENT INTERVENTION;  Surgeon: Corky Crafts, MD;  [facility];  Service: Cardiovascular;  Laterality: N/A;   CORONARY STENT INTERVENTION N/A 01/17/2021   Procedure: CORONARY STENT INTERVENTION;  Surgeon: Kathleene Hazel, MD;  [facility];  Service: Cardiovascular;  Laterality: N/A;   EYE SURGERY     INTRAVASCULAR ULTRASOUND/IVUS N/A 08/15/2020   Procedure: Intravascular Ultrasound/IVUS;  Surgeon: Corky Crafts, MD;  Location: [facility] INVASIVE CV LAB;  Service: Cardiovascular;  Laterality: N/A;   LEFT HEART CATH AND CORONARY ANGIOGRAPHY N/A 08/15/2020   Procedure: LEFT HEART CATH AND CORONARY ANGIOGRAPHY;  Surgeon: Corky Crafts, MD;  Location: [facility] INVASIVE CV LAB;  Service: Cardiovascular;  Laterality: N/A;   LEFT HEART CATH AND CORONARY ANGIOGRAPHY  N/A 01/17/2021   Procedure: LEFT HEART CATH AND CORONARY ANGIOGRAPHY;  Surgeon: Kathleene Hazel, MD;  [facility] CV LAB;  Service: Cardiovascular;  Laterality: N/A;   LUMBAR FUSION     L3-5 with rods   XI ROBOTIC ASSISTED INGUINAL HERNIA REPAIR WITH MESH     x 3 surgeries    There were no vitals filed for this visit.   Subjective Assessment - 11/27/21 1029     Subjective  Pt reports his doctor has released him to gradually return to gym and pool activities    Pertinent History Episode of transient altered mental status (AMS), suspected encephalopathy in the setting  of infection; PMH of multivessel CAD s/p multiple cardiac stents, unstable angina, chronic pain, HTN, T2DM, HLD, OSA on bipap, vertigo, and lumbar sacral spinal stenosis s/p L3-5 lumbar fusion w/ spinal cord stimulator for chronic LBP    Patient Stated Goals Improve balance; to not fall    Currently in Pain? Yes    Pain Score 6     Pain Location Back    Pain Orientation Right    Pain Descriptors / Indicators Sharp    Pain Type Chronic pain             OT Treatment - 11/27/21    Functional Mobility Ambulating around dynamic environment identifying targets with 80% accuracy on first pass; required verbal/contextual cues for locating remaining 1/5 targets on second pass. No LOB or report of dizziness w/ task.    Dynamic Balance Tapping the number identified via ClockAgain app while in standing w/ numbers positioned on the wall following the pattern of a traditional analog clock; completed activity using contralateral UE to tap targets on each side. Activity facilitated dual tasking, standing balance, and functional reach outside BOS. No LOB or pain.  Simulated IADL task, retrieving objects from countertop height and slightly below countertop height and placing at overhead height; able to complete w/out difficulty or increased pain.    ADLs: Tub/Shower Transfers Lateral stepping over 12" block w/ RUE support on countertop to simulate transferring in and out of tub/shower. Completed 5x slowly at [facility] w/ no LOB.    IADLs: Meal Prep Problem-solved w/ pt to identify compensatory strategies most effective to retrieve low level items in back of refrigerator due to decreased mobility and balance concerns, particularly when flexing trunk forward. Recommended sitting on stable step stool w/ handle and/or having a reacher kept to use specifically for the refrigerator; pt verbalized understanding.            OT Short Term Goals - 11/20/21 1535       OT SHORT TERM GOAL #1   Title Pt will verbalize  understanding of at least 2 energy conservation stategies    Baseline Decreased activity tolerance/endurance    Time 3    Period Weeks    Status Achieved   11/20/21   Target Date 12/07/21      OT SHORT TERM GOAL #2   Title Pt will demonstrate independence w/ long-handled sponge to improve participation in bathing tasks    Time 3    Period Weeks    Status Achieved   11/20/21   Target Date 12/07/21      OT SHORT TERM GOAL #3   Title Pt will verbalize understanding of environmental modifications related to fall prevention    Baseline Repeated falls    Time 3    Period Weeks    Status Achieved   11/20/21  Target Date 12/07/21             OT Long Term Goals - 11/27/21 1036       OT LONG TERM GOAL #1   Title Pt will improve TUG time to indicate improved safety w/ functional mobility    Baseline 27 sec    Time 6    Period Weeks    Status Unable to assess   Unable to assess due to early d/c; addressing in PT   Target Date 12/28/21      OT LONG TERM GOAL #2   Title Pt will be able to safely ambulate a specified distance (TBA), using assistive device prn, w/out report of increased pain or fatigue for improved in-home mobility    Baseline TBA    Time 6    Period Weeks    Status Achieved   11/27/21   Target Date 12/28/21      OT LONG TERM GOAL #3   Title Pt will be able to complete a simulated functional task in standing for at least 5 min while demonstrating understanding of energy conservation techniques/good body mechanics    Time 6    Period Weeks    Status Achieved   11/27/21   Target Date 12/28/21             Plan - 11/27/21 1619     Clinical Impression Statement Brandon [name] is a 66 y/o male who has been seen in OP OT due to generalized weakness and imbalance w/ history of falls s/p recent hospitalization 10/31/21 for transient AMS. Pt has shown improvements w/ safety and participation in functional activities, having met 3/3 STGs and 2/3 and LTGs. Pt is continuing to  address balance and ambulation in physical therapy reports he is pleased with his current functional level aside from that. Due to this, OT reviewed all goals w/ pt and discussed any concerns related to functional tasks, answering pt questions as able. Brandon [name] is appropriate for d/c from skilled OT to HEP at this time, demonstrates great understanding of learned strategies, including DME and AE, and is currently agreeable to discharge plan.    OT Occupational Profile and History Detailed Assessment- Review of Records and additional review of physical, cognitive, psychosocial history related to current functional performance    Occupational performance deficits (Please refer to evaluation for details): ADL's;IADL's;Leisure;Social Participation    Body Structure / Function / Physical Skills ADL;Decreased knowledge of use of DME;Gait;Strength;Balance;GMC;Body mechanics;Endurance;Cardiopulmonary status limiting activity;IADL;Mobility    Rehab Potential Fair    Clinical Decision Making Several treatment options, min-mod task modification necessary    Comorbidities Affecting Occupational Performance: Presence of comorbidities impacting occupational performance    Modification or Assistance to Complete Evaluation  Min-Moderate modification of tasks or assist with assess necessary to complete eval    OT Frequency 2x / week   May reduce frequency depending on PT availability   OT Duration 6 weeks    OT Treatment/Interventions Self-care/ADL training;DME and/or AE instruction;Balance training;Aquatic Therapy;Therapeutic activities;Therapeutic exercise;Functional Mobility Training;Patient/family education;Energy conservation    Plan D/C    Consulted and Agree with Plan of Care Patient            Patient will benefit from skilled therapeutic intervention in order to improve the following deficits and impairments:   Body Structure / Function / Physical Skills: ADL, Decreased knowledge of use of DME, Gait,  Strength, Balance, GMC, Body mechanics, Endurance, Cardiopulmonary status limiting activity, IADL, Mobility   Visit Diagnosis: History  of falling  Bilateral low back pain without sciatica, unspecified chronicity  Muscle weakness (generalized)   Problem List Patient Active Problem List   Diagnosis Date Noted   AMS (altered mental status) 11/01/2021   Type 2 diabetes mellitus (HCC) 11/01/2021   Community acquired pneumonia 10/31/2021   Coughing 10/29/2021   Pain management contract signed 10/11/2021   History of lumbar fusion (L4/5) 10/02/2021   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   Chronic pain syndrome 06/01/2021   OSA (obstructive sleep apnea) 05/28/2021   Thrush 05/21/2021   Metallic taste 05/21/2021   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   Acute pain of left shoulder 04/03/2021   Unstable angina (HCC)    Vertigo 11/28/2020   Insomnia 09/03/2020   Status post coronary artery stent placement    Mixed hyperlipidemia    Coronary artery disease    Chest pain 08/11/2020   Lumbar facet arthropathy 06/22/2020   Major depression in partial remission (HCC) 06/04/2020   Hyperlipidemia associated with type 2 diabetes mellitus (HCC) 04/14/2020   Primary hypertension 04/07/2020   Type 2 diabetes mellitus with other specified complication (HCC) 04/07/2020   Sepsis (HCC) 10/06/2019   Spinal cord stimulator status 05/19/2017   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   Erectile dysfunction 10/09/2015    Rosie Fate, MSOT, OTR/L 11/27/2021, 4:21 PM  [facility] [place] [address] [place], [place], [address] Phone: [phone]   Fax:  [phone]  Name: Brandon [name] MRN: [id number] Date of Birth: 1956-03-16

## 2021-11-28 ENCOUNTER — Other Ambulatory Visit (HOSPITAL_COMMUNITY): Payer: Self-pay

## 2021-11-30 ENCOUNTER — Other Ambulatory Visit (HOSPITAL_COMMUNITY): Payer: Self-pay

## 2021-12-02 ENCOUNTER — Other Ambulatory Visit: Payer: Self-pay | Admitting: Family Medicine

## 2021-12-03 ENCOUNTER — Other Ambulatory Visit (HOSPITAL_BASED_OUTPATIENT_CLINIC_OR_DEPARTMENT_OTHER): Payer: Self-pay

## 2021-12-03 MED ORDER — BUPROPION HCL ER (XL) 300 MG PO TB24
300.0000 mg | ORAL_TABLET | Freq: Every day | ORAL | 0 refills | Status: DC
  Filled 2021-12-03: qty 90, 90d supply, fill #0

## 2021-12-03 MED ORDER — PANTOPRAZOLE SODIUM 40 MG PO TBEC
40.0000 mg | DELAYED_RELEASE_TABLET | Freq: Every day | ORAL | 0 refills | Status: DC
  Filled 2021-12-03: qty 90, 90d supply, fill #0

## 2021-12-03 MED ORDER — TAMSULOSIN HCL 0.4 MG PO CAPS
0.4000 mg | ORAL_CAPSULE | Freq: Every day | ORAL | 1 refills | Status: DC
  Filled 2021-12-03: qty 90, 90d supply, fill #0
  Filled 2022-02-01 – 2022-02-27 (×2): qty 90, 90d supply, fill #1

## 2021-12-04 ENCOUNTER — Ambulatory Visit: Payer: No Typology Code available for payment source | Admitting: Physical Therapy

## 2021-12-04 ENCOUNTER — Ambulatory Visit: Payer: No Typology Code available for payment source | Admitting: Occupational Therapy

## 2021-12-07 ENCOUNTER — Other Ambulatory Visit (HOSPITAL_BASED_OUTPATIENT_CLINIC_OR_DEPARTMENT_OTHER): Payer: Self-pay

## 2021-12-10 ENCOUNTER — Ambulatory Visit: Payer: No Typology Code available for payment source | Admitting: Family Medicine

## 2021-12-10 ENCOUNTER — Ambulatory Visit: Payer: No Typology Code available for payment source | Admitting: Endocrinology

## 2021-12-11 ENCOUNTER — Other Ambulatory Visit (HOSPITAL_BASED_OUTPATIENT_CLINIC_OR_DEPARTMENT_OTHER): Payer: Self-pay

## 2021-12-11 ENCOUNTER — Ambulatory Visit: Payer: No Typology Code available for payment source | Admitting: Occupational Therapy

## 2021-12-11 ENCOUNTER — Ambulatory Visit: Payer: No Typology Code available for payment source | Admitting: Physical Therapy

## 2021-12-11 ENCOUNTER — Encounter: Payer: Self-pay | Admitting: Physical Therapy

## 2021-12-11 DIAGNOSIS — Z9181 History of falling: Secondary | ICD-10-CM

## 2021-12-11 DIAGNOSIS — R2681 Unsteadiness on feet: Secondary | ICD-10-CM

## 2021-12-11 DIAGNOSIS — R2689 Other abnormalities of gait and mobility: Secondary | ICD-10-CM

## 2021-12-11 DIAGNOSIS — M6281 Muscle weakness (generalized): Secondary | ICD-10-CM

## 2021-12-11 DIAGNOSIS — M545 Low back pain, unspecified: Secondary | ICD-10-CM

## 2021-12-11 NOTE — Therapy (Signed)
[facility] [address]. [place], [place], [address] Phone: [phone]   Fax:  [phone]  Physical Therapy Treatment  Patient Details  Name: Brandon [name] MRN: [id number] Date of Birth: 06/03/1956 Referring Provider (PT): Adhikari   Encounter Date: 12/11/2021   PT End of Session - 12/11/21 1058     Visit Number 3    Number of Visits 13    Date for PT Re-Evaluation 01/01/22    Authorization Type [employer] and Medicare    Authorization Time Period 11/20/21 to 01/01/22    Authorization - Number of Visits 25    PT Start Time 1016    PT Stop Time 1058    PT Time Calculation (min) 42 min    Activity Tolerance Patient tolerated treatment well    Behavior During Therapy [facility] for tasks assessed/performed;Flat affect             Past Medical History:  Diagnosis Date   Anxiety    Basal cell carcinoma (BCC) in situ of skin    Colon polyps    Coronary artery disease    S/p DES to mRCA in 12/21 // S/p DES to LCx x 2 in 12/2020 // Diff dz in small to mod Dx (not a good target for PCI); OM1 100 CTO; severe dz in small OM2 and PDA >> Med Rx   Depression    Diabetes mellitus without complication (HCC)    HLD (hyperlipidemia)    Hypertension    SBO (small bowel obstruction) (HCC)     Past Surgical History:  Procedure Laterality Date   BACK SURGERY     CARDIAC CATHETERIZATION     CATARACT EXTRACTION W/ INTRAOCULAR LENS  IMPLANT, BILATERAL     CHOLECYSTECTOMY     CHOLECYSTECTOMY, LAPAROSCOPIC     CORONARY STENT INTERVENTION N/A 08/15/2020   Procedure: CORONARY STENT INTERVENTION;  Surgeon: Corky Crafts, MD;  [facility]: [facility] INVASIVE CV LAB;  Service: Cardiovascular;  Laterality: N/A;   CORONARY STENT INTERVENTION N/A 01/17/2021   Procedure: CORONARY STENT INTERVENTION;  Surgeon: Kathleene Hazel, MD;  Location: [facility] INVASIVE CV LAB;  Service: Cardiovascular;  Laterality: N/A;   EYE SURGERY     INTRAVASCULAR  ULTRASOUND/IVUS N/A 08/15/2020   Procedure: Intravascular Ultrasound/IVUS;  Surgeon: Corky Crafts, MD;  Location: [facility] INVASIVE CV LAB;  Service: Cardiovascular;  Laterality: N/A;   LEFT HEART CATH AND CORONARY ANGIOGRAPHY N/A 08/15/2020   Procedure: LEFT HEART CATH AND CORONARY ANGIOGRAPHY;  Surgeon: Corky Crafts, MD;  Location: [facility] INVASIVE CV LAB;  Service: Cardiovascular;  Laterality: N/A;   LEFT HEART CATH AND CORONARY ANGIOGRAPHY N/A 01/17/2021   Procedure: LEFT HEART CATH AND CORONARY ANGIOGRAPHY;  Surgeon: Kathleene Hazel, MD;  Location: [facility] INVASIVE CV LAB;  Service: Cardiovascular;  Laterality: N/A;   LUMBAR FUSION     L3-5 with rods   XI ROBOTIC ASSISTED INGUINAL HERNIA REPAIR WITH MESH     x 3 surgeries    There were no vitals filed for this visit.   Subjective Assessment - 12/11/21 1017     Subjective My back has been bothering me the past couple weeks, I didn't charge my stimulator battery in time but the battery is OK now.I've been working on exercises for my hips and balance since my stimulator is back online.    Patient Stated Goals improve balance, be more stable on my feet    Currently in Pain? Yes    Pain Score 4  Pain Location Back    Pain Orientation Right;Left;Lower    Pain Descriptors / Indicators Sore;Aching;Dull    Pain Type Chronic pain                               [facility] Adult PT Treatment/Exercise - 12/11/21 0001       Knee/Hip Exercises: Aerobic   Recumbent Bike L4 x6 minutes    Nustep --      Knee/Hip Exercises: Supine   Bridges Both;1 set;15 reps    Other Supine Knee/Hip Exercises supine clams red tb 1x15; bridge with clamshell red TB 1x10    Other Supine Knee/Hip Exercises PPT x10 with 3 second holds, PPT with march 1x5 B                 Balance Exercises - 12/11/21 0001       Balance Exercises: Standing   Tandem Stance Eyes open;Foam/compliant surface;3 reps;20 secs    Marching  Foam/compliant surface;20 reps    Other Standing Exercises lateral toe taps off of blue foam pad x10 B, forwad and backwards taps off of blue foam pad x10 B    Other Standing Exercises Comments stepping over cones forward and lateral in // bars                PT Education - 12/11/21 1058     Education Details exercise form/purpose    Person(s) Educated Patient    Methods Explanation    Comprehension Verbalized understanding              PT Short Term Goals - 11/20/21 1806       PT SHORT TERM GOAL #1   Title Will be compliant with appropriate progressive  HEP    Time 3    Period Weeks    Status New    Target Date 12/11/21      PT SHORT TERM GOAL #2   Title Will be able to name 3 ways to prevent falls at home and in the community    Time 3    Period Weeks    Status New      PT SHORT TERM GOAL #3   Title Will be able to maintain SLS for 15 seconds B and tandem stance for 30 seconds B to show improved functional balance    Time 3    Period Weeks    Status New               PT Long Term Goals - 11/20/21 1808       PT LONG TERM GOAL #1   Title MMT to improve by at least 1 grade in all weak groups to show improved functional strength    Time 6    Period Weeks    Status New    Target Date 01/01/22      PT LONG TERM GOAL #2   Title Berg score to impove to 50/56 and DGI to improve to 18/24 to show reduced fall risk    Time 6    Period Weeks    Status New      PT LONG TERM GOAL #3   Title Will be able to get from kneel to stand with no more than min guard assistance to show improved strength and balance    Time 6    Period Weeks    Status New      PT LONG TERM  GOAL #4   Title Will be independent with appropriate gym based exercise program to maintain functional gains    Time 6    Period Weeks    Status New                   Plan - 12/11/21 1058     Clinical Impression Statement Taryll arrives today doing OK, has been having a lot of  back pain due to his stimulator running out of battery but this has been resolved, has been able to be a little more active since. Spent some time working on core strength to try and help with back pain, otherwise progressed Nustep and balance training today. Tolerated well, I continued to [facility] own since MD lifted restrictions recently.    Personal Factors and Comorbidities Fitness;Time since onset of injury/illness/exacerbation    Examination-Activity Limitations Locomotion Level;Transfers;Squat;Stairs;Stand    Examination-Participation Restrictions Community Activity;Shop;Yard Work    Conservation officer, historic buildings Stable/Uncomplicated    Clinical Decision Making Low    Rehab Potential Good    PT Frequency 2x / week    PT Duration 6 weeks    PT Treatment/Interventions ADLs/Self Care Home Management;DME Instruction;Gait training;Stair training;Functional mobility training;Therapeutic activities;Therapeutic exercise;Balance training;Neuromuscular re-education;Patient/family education;Manual techniques;Energy conservation;Taping    PT Next Visit Plan focus on strength, balance, functiona activity tolerance    PT Home Exercise Plan [email or web address]    Consulted and Agree with Plan of Care Patient             Patient will benefit from skilled therapeutic intervention in order to improve the following deficits and impairments:  Difficulty walking, Decreased coordination, Decreased activity tolerance, Decreased balance, Decreased mobility, Decreased strength  Visit Diagnosis: History of falling  Muscle weakness (generalized)  Bilateral low back pain without sciatica, unspecified chronicity  Unsteadiness on feet  Other abnormalities of gait and mobility     Problem List Patient Active Problem List   Diagnosis Date Noted   AMS (altered mental status) 11/01/2021   Type 2 diabetes mellitus (HCC) 11/01/2021   Community acquired  pneumonia 10/31/2021   Coughing 10/29/2021   Pain management contract signed 10/11/2021   History of lumbar fusion (L4/5) 10/02/2021   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   Chronic pain syndrome 06/01/2021   OSA (obstructive sleep apnea) 05/28/2021   Thrush 05/21/2021   Metallic taste 05/21/2021   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   Acute pain of left shoulder 04/03/2021   Unstable angina (HCC)    Vertigo 11/28/2020   Insomnia 09/03/2020   Status post coronary artery stent placement    Mixed hyperlipidemia    Coronary artery disease    Chest pain 08/11/2020   Lumbar facet arthropathy 06/22/2020   Major depression in partial remission (HCC) 06/04/2020   Hyperlipidemia associated with type 2 diabetes mellitus (HCC) 04/14/2020   Primary hypertension 04/07/2020   Type 2 diabetes mellitus with other specified complication (HCC) 04/07/2020   Sepsis (HCC) 10/06/2019   Spinal cord stimulator status 05/19/2017   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   Erectile dysfunction 10/09/2015   Lerry Liner PT, DPT, PN2   Supplemental Physical Therapist [facility]       [facility] [address] [place], [place], [address] Phone: [phone]   Fax:  [phone]  Name: Brandon [name] MRN: [id number] Date of Birth: 12/20/1955

## 2021-12-12 ENCOUNTER — Other Ambulatory Visit: Payer: Self-pay | Admitting: Family Medicine

## 2021-12-12 ENCOUNTER — Other Ambulatory Visit (HOSPITAL_BASED_OUTPATIENT_CLINIC_OR_DEPARTMENT_OTHER): Payer: Self-pay

## 2021-12-12 MED ORDER — ONDANSETRON 4 MG PO TBDP
4.0000 mg | ORAL_TABLET | Freq: Three times a day (TID) | ORAL | 0 refills | Status: DC | PRN
  Filled 2021-12-12: qty 20, 7d supply, fill #0

## 2021-12-13 ENCOUNTER — Other Ambulatory Visit (HOSPITAL_BASED_OUTPATIENT_CLINIC_OR_DEPARTMENT_OTHER): Payer: Self-pay

## 2021-12-18 ENCOUNTER — Ambulatory Visit: Payer: No Typology Code available for payment source | Admitting: Physical Therapy

## 2021-12-18 ENCOUNTER — Ambulatory Visit: Payer: No Typology Code available for payment source | Admitting: Occupational Therapy

## 2021-12-21 ENCOUNTER — Other Ambulatory Visit (HOSPITAL_BASED_OUTPATIENT_CLINIC_OR_DEPARTMENT_OTHER): Payer: Self-pay

## 2021-12-25 ENCOUNTER — Other Ambulatory Visit (HOSPITAL_BASED_OUTPATIENT_CLINIC_OR_DEPARTMENT_OTHER): Payer: Self-pay

## 2021-12-25 ENCOUNTER — Ambulatory Visit: Payer: No Typology Code available for payment source | Admitting: Physical Therapy

## 2021-12-27 ENCOUNTER — Encounter: Payer: Self-pay | Admitting: Family Medicine

## 2021-12-27 ENCOUNTER — Ambulatory Visit (INDEPENDENT_AMBULATORY_CARE_PROVIDER_SITE_OTHER): Payer: No Typology Code available for payment source | Admitting: Family Medicine

## 2021-12-27 VITALS — BP 144/83 | HR 82 | Temp 98.0°F | Ht 69.0 in | Wt 193.0 lb

## 2021-12-27 DIAGNOSIS — Z1211 Encounter for screening for malignant neoplasm of colon: Secondary | ICD-10-CM | POA: Diagnosis not present

## 2021-12-27 DIAGNOSIS — A084 Viral intestinal infection, unspecified: Secondary | ICD-10-CM | POA: Insufficient documentation

## 2021-12-27 NOTE — Patient Instructions (Signed)
Viral Gastroenteritis, Adult  Viral gastroenteritis is also known as the stomach flu. This condition may affect your stomach, your small intestine, and your large intestine. It can cause sudden watery poop (diarrhea), fever, and vomiting. This condition is caused by certain germs (viruses). These germs can be passed from person to person very easily (are contagious). Having watery poop and vomiting can make you feel weak and cause you to not have enough water in your body (get dehydrated). This can make you tired and thirsty, make you have a dry mouth, and make it so you pee (urinate) less often. It is important to replace the fluids that you lose from having watery poop and vomiting. What are the causes? You can get sick by catching germs from other people. You can also get sick by: Eating food, drinking water, or touching a surface that has the germs on it (is contaminated). Sharing utensils or other personal items with a person who is sick. What increases the risk? Having a weak body defense system (immune system). Living with one or more children who are younger than 2 years. Living in a nursing home. Going on cruise ships. What are the signs or symptoms? Symptoms of this condition start suddenly. Symptoms may last for a few days or for as long as a week. Common symptoms include: Watery poop. Vomiting. Other symptoms include: Fever. Headache. Feeling tired (fatigue). Pain in the belly (abdomen). Chills. Feeling weak. Feeling like you may vomit (nauseous). Muscle aches. Not feeling hungry. How is this treated? This condition typically goes away on its own. The focus of treatment is to replace the fluids that you lose. This condition may be treated with: An ORS (oral rehydration solution). This is a drink that helps you replace fluids and minerals your body lost. It is sold at pharmacies and stores. Medicines to help with your symptoms. Probiotic supplements to reduce symptoms of  watery poop. Fluids given through an IV tube, if needed. Older adults and people with other diseases or a weak body defense system are at higher risk for not having enough water in the body. Follow these instructions at home: Eating and drinking  Take an ORS as told by your doctor. Drink clear fluids in small amounts as you are able. Clear fluids include: Water. Ice chips. Fruit juice that has water added to it (is diluted). Low-calorie sports drinks. Drink enough fluid to keep your pee (urine) pale yellow. Eat small amounts of healthy foods every 3-4 hours as you are able. This may include whole grains, fruits, vegetables, lean meats, and yogurt. Avoid fluids that have a lot of sugar or caffeine in them. This includes energy drinks, sports drinks, and soda. Avoid spicy or fatty foods. Avoid alcohol. General instructions  Wash your hands often. This is very important after you have watery poop or you vomit. If you cannot use soap and water, use hand sanitizer. Make sure that all people in your home wash their hands well and often. Take over-the-counter and prescription medicines only as told by your doctor. Rest at home while you get better. Watch your condition for any changes. Take a warm bath to help with any burning or pain from having watery poop. Keep all follow-up visits. Contact a doctor if: You cannot keep fluids down. Your symptoms get worse. You have new symptoms. You feel light-headed or dizzy. You have muscle cramps. Get help right away if: You have chest pain. You have trouble breathing, or you are breathing very fast.   You have a fast heartbeat. You feel very weak or you faint. You have a very bad headache, a stiff neck, or both. You have a rash. You have very bad pain, cramping, or bloating in your belly. Your skin feels cold and clammy. You feel mixed up (confused). You have pain when you pee. You have signs of not having enough water in the body, such  as: Dark pee, hardly any pee, or no pee. Cracked lips. Dry mouth. Sunken eyes. Feeling very sleepy. Feeling weak. You have signs of bleeding, such as: You see blood in your vomit. Your vomit looks like coffee grounds. You have bloody or black poop or poop that looks like tar. These symptoms may be an emergency. Get help right away. Call 911. Do not wait to see if the symptoms will go away. Do not drive yourself to the hospital. Summary Viral gastroenteritis is also known as the stomach flu. This condition can cause sudden watery poop (diarrhea), fever, and vomiting. These germs can be passed from person to person very easily. Take an ORS (oral rehydration solution) as told by your doctor. This is a drink that is sold at pharmacies and stores. Wash your hands often, especially after having watery poop or vomiting. If you cannot use soap and water, use hand sanitizer. This information is not intended to replace advice given to you by your health care provider. Make sure you discuss any questions you have with your health care provider. Document Revised: 06/11/2021 Document Reviewed: 06/11/2021 Elsevier Patient Education  2023 Elsevier Inc.  

## 2021-12-27 NOTE — Assessment & Plan Note (Signed)
Improving at this point.  Continue supportive care with increased fluids.  Has CGM and glucose is back in normal range.  He will contact the clinic if symptoms worsen again.   ?

## 2021-12-27 NOTE — Progress Notes (Signed)
?Brandon [name] - 66 y.o. male MRN [id number]  Date of birth: 05-27-1956 ? ?Subjective ?Chief Complaint  ?Patient presents with  ? Emesis  ? Diarrhea  ? ? ?HPI ?Brandon [name] is a 66 y.o. here today with complaint of nausea, vomiting, diarrhea.  Symptoms started a few days ago but have pretty much resolved at this point.  He did have some chills associated with this, but not sure if he had fever.  He denies significant abdominal pain.  He is hydrating well and appetite is improving.  He was concerned that his blood sugars were elevated into the 250's during his illness but have since returned to normal.  ? ?ROS:  A comprehensive ROS was completed and negative except as noted per HPI ? ?Allergies  ?Allergen Reactions  ? Kiwi Extract Swelling  ?  Mouth swelling. ?  ? Other Swelling and Other (See Comments)  ?  EGGPLANT. Mouth swelling.  ? Penicillins Rash  ?  Reaction: unknown  ? Peach [Prunus Persica] Other (See Comments)  ?  Burns mouth  ? Ancef [Cefazolin] Rash  ? Latex Rash  ? Levaquin [Levofloxacin] Rash  ? ? ?Past Medical History:  ?Diagnosis Date  ? Anxiety   ? Basal cell carcinoma (BCC) in situ of skin   ? Colon polyps   ? Coronary artery disease   ? S/p DES to mRCA in 12/21 // S/p DES to LCx x 2 in 12/2020 // Diff dz in small to mod Dx (not a good target for PCI); OM1 100 CTO; severe dz in small OM2 and PDA >> Med Rx  ? Depression   ? Diabetes mellitus without complication (HCC)   ? HLD (hyperlipidemia)   ? Hypertension   ? SBO (small bowel obstruction) (HCC)   ? ? ?Past Surgical History:  ?Procedure Laterality Date  ? BACK SURGERY    ? CARDIAC CATHETERIZATION    ? CATARACT EXTRACTION W/ INTRAOCULAR LENS  IMPLANT, BILATERAL    ? CHOLECYSTECTOMY    ? CHOLECYSTECTOMY, LAPAROSCOPIC    ? CORONARY STENT INTERVENTION N/A 08/15/2020  ? Procedure: CORONARY STENT INTERVENTION;  Surgeon: Corky Crafts, MD;  Location: [facility] INVASIVE CV LAB;  Service: Cardiovascular;  Laterality: N/A;  ? CORONARY STENT INTERVENTION  N/A 01/17/2021  ? Procedure: CORONARY STENT INTERVENTION;  Surgeon: Kathleene Hazel, MD;  [facility]: [facility] INVASIVE CV LAB;  Service: Cardiovascular;  Laterality: N/A;  ? EYE SURGERY    ? INTRAVASCULAR ULTRASOUND/IVUS N/A 08/15/2020  ? Procedure: Intravascular Ultrasound/IVUS;  Surgeon: Corky Crafts, MD;  Location: [facility] INVASIVE CV LAB;  Service: Cardiovascular;  Laterality: N/A;  ? LEFT HEART CATH AND CORONARY ANGIOGRAPHY N/A 08/15/2020  ? Procedure: LEFT HEART CATH AND CORONARY ANGIOGRAPHY;  Surgeon: Corky Crafts, MD;  Location: [facility] INVASIVE CV LAB;  Service: Cardiovascular;  Laterality: N/A;  ? LEFT HEART CATH AND CORONARY ANGIOGRAPHY N/A 01/17/2021  ? Procedure: LEFT HEART CATH AND CORONARY ANGIOGRAPHY;  Surgeon: Kathleene Hazel, MD;  Location: [facility] INVASIVE CV LAB;  Service: Cardiovascular;  Laterality: N/A;  ? LUMBAR FUSION    ? L3-5 with rods  ? XI ROBOTIC ASSISTED INGUINAL HERNIA REPAIR WITH MESH    ? x 3 surgeries  ? ? ?Social History  ? ?Socioeconomic History  ? Marital status: Married  ?  Spouse name: Not on file  ? Number of children: 4  ? Years of education: 63  ? Highest education level: Not on file  ?Occupational History  ? Occupation: disabled  ?  Tobacco Use  ? Smoking status: Former  ?  Types: Cigarettes  ?  Quit date: 2007  ?  Years since quitting: 16.3  ? Smokeless tobacco: Never  ?Vaping Use  ? Vaping Use: Never used  ?Substance and Sexual Activity  ? Alcohol use: Not Currently  ? Drug use: Not Currently  ? Sexual activity: Yes  ?Other Topics Concern  ? Not on file  ?Social History Narrative  ? Not on file  ? ?Social Determinants of Health  ? ?Financial Resource Strain: Not on file  ?Food Insecurity: Not on file  ?Transportation Needs: Not on file  ?Physical Activity: Not on file  ?Stress: Not on file  ?Social Connections: Not on file  ? ? ?Family History  ?Problem Relation Age of Onset  ? COPD Mother   ? Anxiety disorder Mother   ? Depression Mother   ? Cancer Father   ?  Anxiety disorder Father   ? Depression Father   ? Other Father   ? Diabetes Neg Hx   ? ? ?Health Maintenance  ?Topic Date Due  ? URINE MICROALBUMIN  Never done  ? Hepatitis C Screening  Never done  ? COLONOSCOPY (Pts 45-2yrs Insurance coverage will need to be confirmed)  Never done  ? COVID-19 Vaccine (3 - Booster for Pfizer series) 01/12/2022 (Originally 08/11/2020)  ? Zoster Vaccines- Shingrix (1 of 2) 03/29/2022 (Originally 09/13/2005)  ? TETANUS/TDAP  12/28/2022 (Originally 09/13/1974)  ? INFLUENZA VACCINE  03/26/2022  ? OPHTHALMOLOGY EXAM  04/24/2022  ? HEMOGLOBIN A1C  05/04/2022  ? FOOT EXAM  05/29/2022  ? Pneumonia Vaccine 35+ Years old  Completed  ? HPV VACCINES  Aged Out  ? ? ? ?----------------------------------------------------------------------------------------------------------------------------------------------------------------------------------------------------------------- ?Physical Exam ?BP (!) 144/83   Pulse 82   Temp 98 ?F (36.7 ?C) (Oral)   Ht 5\' 9"  (1.753 m)   Wt 193 lb (87.5 kg)   SpO2 98%   BMI 28.50 kg/m?  ? ?Physical Exam ?Constitutional:   ?   Appearance: Normal appearance.  ?Cardiovascular:  ?   Rate and Rhythm: Normal rate and regular rhythm.  ?Pulmonary:  ?   Effort: Pulmonary effort is normal.  ?   Breath sounds: Normal breath sounds.  ?Abdominal:  ?   General: Abdomen is flat. There is no distension.  ?   Palpations: Abdomen is soft.  ?   Tenderness: There is no abdominal tenderness.  ?Neurological:  ?   Mental Status: He is alert.  ?Psychiatric:     ?   Mood and Affect: Mood normal.     ?   Behavior: Behavior normal.  ? ? ?------------------------------------------------------------------------------------------------------------------------------------------------------------------------------------------------------------------- ?Assessment and Plan ? ?Viral gastroenteritis ?Improving at this point.  Continue supportive care with increased fluids.  Has CGM and glucose is  back in normal range.  He will contact the clinic if symptoms worsen again.   ? ? ?No orders of the defined types were placed in this encounter. ? ? ?No follow-ups on file. ? ? ? ?This visit occurred during the SARS-CoV-2 public health emergency.  Safety protocols were in place, including screening questions prior to the visit, additional usage of staff PPE, and extensive cleaning of exam room while observing appropriate contact time as indicated for disinfecting solutions.  ? ?

## 2021-12-29 ENCOUNTER — Emergency Department (HOSPITAL_BASED_OUTPATIENT_CLINIC_OR_DEPARTMENT_OTHER): Payer: No Typology Code available for payment source

## 2021-12-29 ENCOUNTER — Other Ambulatory Visit: Payer: Self-pay

## 2021-12-29 ENCOUNTER — Encounter (HOSPITAL_BASED_OUTPATIENT_CLINIC_OR_DEPARTMENT_OTHER): Payer: Self-pay

## 2021-12-29 ENCOUNTER — Inpatient Hospital Stay (HOSPITAL_BASED_OUTPATIENT_CLINIC_OR_DEPARTMENT_OTHER)
Admission: EM | Admit: 2021-12-29 | Discharge: 2022-01-02 | DRG: 390 | Disposition: A | Discharge status: Home / Self Care | Payer: No Typology Code available for payment source | Attending: Internal Medicine | Admitting: Internal Medicine

## 2021-12-29 DIAGNOSIS — Z79899 Other long term (current) drug therapy: Secondary | ICD-10-CM

## 2021-12-29 DIAGNOSIS — E1165 Type 2 diabetes mellitus with hyperglycemia: Secondary | ICD-10-CM

## 2021-12-29 DIAGNOSIS — E119 Type 2 diabetes mellitus without complications: Secondary | ICD-10-CM | POA: Diagnosis present

## 2021-12-29 DIAGNOSIS — Z880 Allergy status to penicillin: Secondary | ICD-10-CM

## 2021-12-29 DIAGNOSIS — F419 Anxiety disorder, unspecified: Secondary | ICD-10-CM | POA: Diagnosis present

## 2021-12-29 DIAGNOSIS — Z91018 Allergy to other foods: Secondary | ICD-10-CM

## 2021-12-29 DIAGNOSIS — E1169 Type 2 diabetes mellitus with other specified complication: Secondary | ICD-10-CM | POA: Diagnosis present

## 2021-12-29 DIAGNOSIS — Z881 Allergy status to other antibiotic agents status: Secondary | ICD-10-CM

## 2021-12-29 DIAGNOSIS — Z955 Presence of coronary angioplasty implant and graft: Secondary | ICD-10-CM

## 2021-12-29 DIAGNOSIS — G4733 Obstructive sleep apnea (adult) (pediatric): Secondary | ICD-10-CM | POA: Diagnosis present

## 2021-12-29 DIAGNOSIS — K56609 Unspecified intestinal obstruction, unspecified as to partial versus complete obstruction: Secondary | ICD-10-CM | POA: Diagnosis not present

## 2021-12-29 DIAGNOSIS — Z9641 Presence of insulin pump (external) (internal): Secondary | ICD-10-CM | POA: Diagnosis present

## 2021-12-29 DIAGNOSIS — Z7902 Long term (current) use of antithrombotics/antiplatelets: Secondary | ICD-10-CM

## 2021-12-29 DIAGNOSIS — G894 Chronic pain syndrome: Secondary | ICD-10-CM | POA: Diagnosis present

## 2021-12-29 DIAGNOSIS — Z794 Long term (current) use of insulin: Secondary | ICD-10-CM

## 2021-12-29 DIAGNOSIS — Z85828 Personal history of other malignant neoplasm of skin: Secondary | ICD-10-CM

## 2021-12-29 DIAGNOSIS — Z87891 Personal history of nicotine dependence: Secondary | ICD-10-CM

## 2021-12-29 DIAGNOSIS — I2510 Atherosclerotic heart disease of native coronary artery without angina pectoris: Secondary | ICD-10-CM | POA: Diagnosis present

## 2021-12-29 DIAGNOSIS — Z818 Family history of other mental and behavioral disorders: Secondary | ICD-10-CM

## 2021-12-29 DIAGNOSIS — Z7984 Long term (current) use of oral hypoglycemic drugs: Secondary | ICD-10-CM

## 2021-12-29 DIAGNOSIS — F32A Depression, unspecified: Secondary | ICD-10-CM | POA: Diagnosis present

## 2021-12-29 DIAGNOSIS — Z981 Arthrodesis status: Secondary | ICD-10-CM

## 2021-12-29 DIAGNOSIS — I10 Essential (primary) hypertension: Secondary | ICD-10-CM | POA: Diagnosis present

## 2021-12-29 DIAGNOSIS — E876 Hypokalemia: Secondary | ICD-10-CM | POA: Diagnosis present

## 2021-12-29 DIAGNOSIS — Z91040 Latex allergy status: Secondary | ICD-10-CM

## 2021-12-29 DIAGNOSIS — Z7982 Long term (current) use of aspirin: Secondary | ICD-10-CM

## 2021-12-29 DIAGNOSIS — N400 Enlarged prostate without lower urinary tract symptoms: Secondary | ICD-10-CM | POA: Diagnosis present

## 2021-12-29 DIAGNOSIS — K56600 Partial intestinal obstruction, unspecified as to cause: Principal | ICD-10-CM | POA: Diagnosis present

## 2021-12-29 DIAGNOSIS — E782 Mixed hyperlipidemia: Secondary | ICD-10-CM | POA: Diagnosis present

## 2021-12-29 DIAGNOSIS — Z9689 Presence of other specified functional implants: Secondary | ICD-10-CM

## 2021-12-29 HISTORY — DX: Other chronic pain: G89.29

## 2021-12-29 LAB — CBC WITH DIFFERENTIAL/PLATELET
Abs Immature Granulocytes: 0.02 10*3/uL (ref 0.00–0.07)
Basophils Absolute: 0 10*3/uL (ref 0.0–0.1)
Basophils Relative: 0 %
Eosinophils Absolute: 0.1 10*3/uL (ref 0.0–0.5)
Eosinophils Relative: 1 %
HCT: 49.5 % (ref 39.0–52.0)
Hemoglobin: 16.8 g/dL (ref 13.0–17.0)
Immature Granulocytes: 0 %
Lymphocytes Relative: 12 %
Lymphs Abs: 1.2 10*3/uL (ref 0.7–4.0)
MCH: 30.5 pg (ref 26.0–34.0)
MCHC: 33.9 g/dL (ref 30.0–36.0)
MCV: 89.8 fL (ref 80.0–100.0)
Monocytes Absolute: 1 10*3/uL (ref 0.1–1.0)
Monocytes Relative: 10 %
Neutro Abs: 7.9 10*3/uL — ABNORMAL HIGH (ref 1.7–7.7)
Neutrophils Relative %: 77 %
Platelets: 233 10*3/uL (ref 150–400)
RBC: 5.51 MIL/uL (ref 4.22–5.81)
RDW: 15.5 % (ref 11.5–15.5)
WBC: 10.2 10*3/uL (ref 4.0–10.5)
nRBC: 0 % (ref 0.0–0.2)

## 2021-12-29 LAB — LIPASE, BLOOD: Lipase: 42 U/L (ref 11–51)

## 2021-12-29 LAB — COMPREHENSIVE METABOLIC PANEL
ALT: 15 U/L (ref 0–44)
AST: 22 U/L (ref 15–41)
Albumin: 4.1 g/dL (ref 3.5–5.0)
Alkaline Phosphatase: 79 U/L (ref 38–126)
Anion gap: 8 (ref 5–15)
BUN: 7 mg/dL — ABNORMAL LOW (ref 8–23)
CO2: 25 mmol/L (ref 22–32)
Calcium: 9.3 mg/dL (ref 8.9–10.3)
Chloride: 106 mmol/L (ref 98–111)
Creatinine, Ser: 0.67 mg/dL (ref 0.61–1.24)
GFR, Estimated: >60 mL/min (ref >60)
Glucose, Bld: 118 mg/dL — ABNORMAL HIGH (ref 70–99)
Potassium: 3.4 mmol/L — ABNORMAL LOW (ref 3.5–5.1)
Sodium: 139 mmol/L (ref 135–145)
Total Bilirubin: 0.7 mg/dL (ref 0.3–1.2)
Total Protein: 7.7 g/dL (ref 6.5–8.1)

## 2021-12-29 LAB — TROPONIN I (HIGH SENSITIVITY)
Troponin I (High Sensitivity): 4 ng/L (ref <18)
Troponin I (High Sensitivity): 4 ng/L (ref <18)

## 2021-12-29 LAB — CBG MONITORING, ED: Glucose-Capillary: 107 mg/dL — ABNORMAL HIGH (ref 70–99)

## 2021-12-29 MED ORDER — ONDANSETRON HCL 4 MG/2ML IJ SOLN
4.0000 mg | Freq: Once | INTRAMUSCULAR | Status: AC
  Administered 2021-12-29: 4 mg via INTRAVENOUS
  Filled 2021-12-29: qty 2

## 2021-12-29 NOTE — ED Triage Notes (Signed)
Pt c/o abd pain and chest pain that started around lunch today. Pt reports abd distension, burping, and passing gas.  ?

## 2021-12-30 ENCOUNTER — Encounter (HOSPITAL_BASED_OUTPATIENT_CLINIC_OR_DEPARTMENT_OTHER): Payer: Self-pay

## 2021-12-30 ENCOUNTER — Emergency Department (HOSPITAL_BASED_OUTPATIENT_CLINIC_OR_DEPARTMENT_OTHER): Payer: No Typology Code available for payment source

## 2021-12-30 ENCOUNTER — Inpatient Hospital Stay (HOSPITAL_COMMUNITY): Payer: No Typology Code available for payment source

## 2021-12-30 DIAGNOSIS — Z7902 Long term (current) use of antithrombotics/antiplatelets: Secondary | ICD-10-CM | POA: Diagnosis not present

## 2021-12-30 DIAGNOSIS — E876 Hypokalemia: Secondary | ICD-10-CM | POA: Diagnosis present

## 2021-12-30 DIAGNOSIS — Z955 Presence of coronary angioplasty implant and graft: Secondary | ICD-10-CM | POA: Diagnosis not present

## 2021-12-30 DIAGNOSIS — F32A Depression, unspecified: Secondary | ICD-10-CM | POA: Diagnosis present

## 2021-12-30 DIAGNOSIS — I10 Essential (primary) hypertension: Secondary | ICD-10-CM | POA: Diagnosis present

## 2021-12-30 DIAGNOSIS — I2510 Atherosclerotic heart disease of native coronary artery without angina pectoris: Secondary | ICD-10-CM | POA: Diagnosis present

## 2021-12-30 DIAGNOSIS — K56609 Unspecified intestinal obstruction, unspecified as to partial versus complete obstruction: Secondary | ICD-10-CM | POA: Diagnosis present

## 2021-12-30 DIAGNOSIS — Z818 Family history of other mental and behavioral disorders: Secondary | ICD-10-CM | POA: Diagnosis not present

## 2021-12-30 DIAGNOSIS — Z91040 Latex allergy status: Secondary | ICD-10-CM | POA: Diagnosis not present

## 2021-12-30 DIAGNOSIS — Z981 Arthrodesis status: Secondary | ICD-10-CM | POA: Diagnosis not present

## 2021-12-30 DIAGNOSIS — Z880 Allergy status to penicillin: Secondary | ICD-10-CM | POA: Diagnosis not present

## 2021-12-30 DIAGNOSIS — I25118 Atherosclerotic heart disease of native coronary artery with other forms of angina pectoris: Secondary | ICD-10-CM | POA: Diagnosis not present

## 2021-12-30 DIAGNOSIS — K56600 Partial intestinal obstruction, unspecified as to cause: Secondary | ICD-10-CM | POA: Diagnosis present

## 2021-12-30 DIAGNOSIS — Z87891 Personal history of nicotine dependence: Secondary | ICD-10-CM | POA: Diagnosis not present

## 2021-12-30 DIAGNOSIS — Z85828 Personal history of other malignant neoplasm of skin: Secondary | ICD-10-CM | POA: Diagnosis not present

## 2021-12-30 DIAGNOSIS — Z881 Allergy status to other antibiotic agents status: Secondary | ICD-10-CM | POA: Diagnosis not present

## 2021-12-30 DIAGNOSIS — G4733 Obstructive sleep apnea (adult) (pediatric): Secondary | ICD-10-CM | POA: Diagnosis present

## 2021-12-30 DIAGNOSIS — Z7982 Long term (current) use of aspirin: Secondary | ICD-10-CM | POA: Diagnosis not present

## 2021-12-30 DIAGNOSIS — E782 Mixed hyperlipidemia: Secondary | ICD-10-CM | POA: Diagnosis present

## 2021-12-30 DIAGNOSIS — E1169 Type 2 diabetes mellitus with other specified complication: Secondary | ICD-10-CM | POA: Diagnosis present

## 2021-12-30 DIAGNOSIS — Z79899 Other long term (current) drug therapy: Secondary | ICD-10-CM | POA: Diagnosis not present

## 2021-12-30 DIAGNOSIS — Z91018 Allergy to other foods: Secondary | ICD-10-CM | POA: Diagnosis not present

## 2021-12-30 DIAGNOSIS — F419 Anxiety disorder, unspecified: Secondary | ICD-10-CM | POA: Diagnosis present

## 2021-12-30 DIAGNOSIS — G894 Chronic pain syndrome: Secondary | ICD-10-CM | POA: Diagnosis present

## 2021-12-30 DIAGNOSIS — N400 Enlarged prostate without lower urinary tract symptoms: Secondary | ICD-10-CM | POA: Diagnosis present

## 2021-12-30 DIAGNOSIS — Z9641 Presence of insulin pump (external) (internal): Secondary | ICD-10-CM | POA: Diagnosis present

## 2021-12-30 LAB — CBC WITH DIFFERENTIAL/PLATELET
Abs Immature Granulocytes: 0.03 10*3/uL (ref 0.00–0.07)
Basophils Absolute: 0 10*3/uL (ref 0.0–0.1)
Basophils Relative: 0 %
Eosinophils Absolute: 0.1 10*3/uL (ref 0.0–0.5)
Eosinophils Relative: 2 %
HCT: 48.3 % (ref 39.0–52.0)
Hemoglobin: 16.5 g/dL (ref 13.0–17.0)
Immature Granulocytes: 0 %
Lymphocytes Relative: 13 %
Lymphs Abs: 1.1 10*3/uL (ref 0.7–4.0)
MCH: 30.6 pg (ref 26.0–34.0)
MCHC: 34.2 g/dL (ref 30.0–36.0)
MCV: 89.6 fL (ref 80.0–100.0)
Monocytes Absolute: 1 10*3/uL (ref 0.1–1.0)
Monocytes Relative: 11 %
Neutro Abs: 6.3 10*3/uL (ref 1.7–7.7)
Neutrophils Relative %: 74 %
Platelets: 212 10*3/uL (ref 150–400)
RBC: 5.39 MIL/uL (ref 4.22–5.81)
RDW: 15.4 % (ref 11.5–15.5)
WBC: 8.5 10*3/uL (ref 4.0–10.5)
nRBC: 0 % (ref 0.0–0.2)

## 2021-12-30 LAB — COMPREHENSIVE METABOLIC PANEL
ALT: 14 U/L (ref 0–44)
AST: 23 U/L (ref 15–41)
Albumin: 3.7 g/dL (ref 3.5–5.0)
Alkaline Phosphatase: 69 U/L (ref 38–126)
Anion gap: 11 (ref 5–15)
BUN: 6 mg/dL — ABNORMAL LOW (ref 8–23)
CO2: 28 mmol/L (ref 22–32)
Calcium: 9.5 mg/dL (ref 8.9–10.3)
Chloride: 104 mmol/L (ref 98–111)
Creatinine, Ser: 0.75 mg/dL (ref 0.61–1.24)
GFR, Estimated: >60 mL/min (ref >60)
Glucose, Bld: 103 mg/dL — ABNORMAL HIGH (ref 70–99)
Potassium: 3.5 mmol/L (ref 3.5–5.1)
Sodium: 143 mmol/L (ref 135–145)
Total Bilirubin: 1.2 mg/dL (ref 0.3–1.2)
Total Protein: 7 g/dL (ref 6.5–8.1)

## 2021-12-30 LAB — MAGNESIUM: Magnesium: 1.8 mg/dL (ref 1.7–2.4)

## 2021-12-30 LAB — GLUCOSE, CAPILLARY
Glucose-Capillary: 86 mg/dL (ref 70–99)
Glucose-Capillary: 97 mg/dL (ref 70–99)

## 2021-12-30 LAB — LACTIC ACID, PLASMA: Lactic Acid, Venous: 1 mmol/L (ref 0.5–1.9)

## 2021-12-30 MED ORDER — HYDRALAZINE HCL 20 MG/ML IJ SOLN: 10.0000 mg | INTRAMUSCULAR | Status: DC | PRN

## 2021-12-30 MED ORDER — INSULIN PUMP
SUBCUTANEOUS | Status: DC
  Filled 2021-12-30: qty 1

## 2021-12-30 MED ORDER — MORPHINE SULFATE (PF) 2 MG/ML IV SOLN
2.0000 mg | INTRAVENOUS | Status: DC | PRN
  Administered 2021-12-30 – 2021-12-31 (×2): 2 mg via INTRAVENOUS
  Filled 2021-12-30 (×2): qty 1

## 2021-12-30 MED ORDER — OXYCODONE HCL 5 MG PO TABS
5.0000 mg | ORAL_TABLET | ORAL | Status: DC | PRN
  Administered 2021-12-30 (×2): 10 mg via ORAL
  Filled 2021-12-30 (×2): qty 2

## 2021-12-30 MED ORDER — METOPROLOL SUCCINATE ER 25 MG PO TB24
25.0000 mg | ORAL_TABLET | Freq: Every day | ORAL | Status: DC
  Administered 2021-12-30 – 2022-01-02 (×4): 25 mg via ORAL
  Filled 2021-12-30 (×4): qty 1

## 2021-12-30 MED ORDER — ARIPIPRAZOLE 10 MG PO TABS
10.0000 mg | ORAL_TABLET | Freq: Every morning | ORAL | Status: DC
  Administered 2021-12-30 – 2022-01-02 (×3): 10 mg via ORAL
  Filled 2021-12-30 (×5): qty 1

## 2021-12-30 MED ORDER — LACTATED RINGERS IV SOLN: INTRAVENOUS | Status: DC

## 2021-12-30 MED ORDER — IOHEXOL 300 MG/ML  SOLN
80.0000 mL | Freq: Once | INTRAMUSCULAR | Status: AC | PRN
  Administered 2021-12-30: 80 mL via INTRAVENOUS

## 2021-12-30 MED ORDER — ONDANSETRON HCL 4 MG/2ML IJ SOLN: 4.0000 mg | Freq: Four times a day (QID) | INTRAMUSCULAR | Status: DC | PRN

## 2021-12-30 MED ORDER — ACETAMINOPHEN 650 MG RE SUPP: 650.0000 mg | Freq: Four times a day (QID) | RECTAL | Status: DC | PRN

## 2021-12-30 MED ORDER — BUPROPION HCL ER (XL) 150 MG PO TB24
300.0000 mg | ORAL_TABLET | Freq: Every day | ORAL | Status: DC
  Administered 2021-12-30 – 2022-01-02 (×4): 300 mg via ORAL
  Filled 2021-12-30 (×4): qty 2

## 2021-12-30 MED ORDER — ATORVASTATIN CALCIUM 80 MG PO TABS
80.0000 mg | ORAL_TABLET | Freq: Every day | ORAL | Status: DC
  Administered 2021-12-30 – 2022-01-02 (×4): 80 mg via ORAL
  Filled 2021-12-30 (×4): qty 1

## 2021-12-30 MED ORDER — FENTANYL CITRATE PF 50 MCG/ML IJ SOSY
100.0000 ug | PREFILLED_SYRINGE | Freq: Once | INTRAMUSCULAR | Status: AC
  Administered 2021-12-30: 100 ug via INTRAVENOUS
  Filled 2021-12-30: qty 2

## 2021-12-30 MED ORDER — TAMSULOSIN HCL 0.4 MG PO CAPS
0.4000 mg | ORAL_CAPSULE | Freq: Every day | ORAL | Status: DC
  Administered 2021-12-30 – 2022-01-02 (×4): 0.4 mg via ORAL
  Filled 2021-12-30 (×4): qty 1

## 2021-12-30 MED ORDER — FENTANYL CITRATE PF 50 MCG/ML IJ SOSY
100.0000 ug | PREFILLED_SYRINGE | INTRAMUSCULAR | Status: DC | PRN
  Administered 2021-12-30 (×4): 100 ug via INTRAVENOUS
  Filled 2021-12-30 (×4): qty 2

## 2021-12-30 MED ORDER — BUPRENORPHINE 20 MCG/HR TD PTWK: 1.0000 | MEDICATED_PATCH | TRANSDERMAL | Status: DC

## 2021-12-30 MED ORDER — ASPIRIN EC 81 MG PO TBEC
81.0000 mg | DELAYED_RELEASE_TABLET | Freq: Every morning | ORAL | Status: DC
  Administered 2021-12-30 – 2022-01-02 (×4): 81 mg via ORAL
  Filled 2021-12-30 (×4): qty 1

## 2021-12-30 MED ORDER — DIATRIZOATE MEGLUMINE & SODIUM 66-10 % PO SOLN
90.0000 mL | Freq: Once | ORAL | Status: AC
  Administered 2021-12-30: 90 mL via ORAL
  Filled 2021-12-30: qty 90

## 2021-12-30 MED ORDER — METOCLOPRAMIDE HCL 5 MG/ML IJ SOLN
10.0000 mg | Freq: Once | INTRAMUSCULAR | Status: AC
Start: 2021-12-30 — End: 2021-12-30
  Administered 2021-12-30: 10 mg via INTRAVENOUS
  Filled 2021-12-30: qty 2

## 2021-12-30 MED ORDER — ACETAMINOPHEN 325 MG PO TABS: 650.0000 mg | ORAL_TABLET | Freq: Four times a day (QID) | ORAL | Status: DC | PRN

## 2021-12-30 MED ORDER — ISOSORBIDE MONONITRATE ER 30 MG PO TB24
30.0000 mg | ORAL_TABLET | Freq: Every day | ORAL | Status: DC
  Administered 2021-12-30 – 2022-01-02 (×4): 30 mg via ORAL
  Filled 2021-12-30 (×4): qty 1

## 2021-12-30 NOTE — ED Notes (Signed)
[facility] to bedside to assume care of patient. ?

## 2021-12-30 NOTE — Progress Notes (Signed)
?  Carryover admission to the Day Admitter. Patient accepted by Dr. Cyd Silence for [facility] to med-tele for further evaluation management of small bowel obstruction.  For additional details please see Dr. Darrick Grinder transfer note.  Of note, patient without NGT at this time.  Secure chat message sent to general surgery requesting formal consultation. ? ?I have placed some additional preliminary admit orders via the adult multi-morbid admission order set. I have also ordered that patient be n.p.o.  Additionally, patient has existing order for prn IV fentanyl.  I also ordered prn IV Zofran as well as continuous lactated Ringer's, in addition to ordering a fresh set of morning labs. ? ?Additionally, I was notified by the patient's RN that he has a pump/spinal stimulator for back pain and a burenorphine 26mcg/hr patch. These items to be further addressed by day admitter.  ? ?Babs Bertin, DO ?Hospitalist ? ?

## 2021-12-30 NOTE — ED Notes (Signed)
Report called to floor [facility], RN. ?

## 2021-12-30 NOTE — Progress Notes (Signed)
Pt arrived to floor, skin assessed, hooked up to tele, bed alarm on and educated on falls. Verified insulin pump, pain patch on right leg and pt has a spinal stimulator.  ?

## 2021-12-30 NOTE — Plan of Care (Signed)
  Problem: Education: Goal: Knowledge of General Education information will improve Description Including pain rating scale, medication(s)/side effects and non-pharmacologic comfort measures Outcome: Progressing   

## 2021-12-30 NOTE — ED Provider Notes (Signed)
[facility] Provider Note: Lowella Dell, MD, FACEP  CSN: [id number] MRN: [id number] ARRIVAL: 12/29/21 at 2019 ROOM: MH02/MH02   CHIEF COMPLAINT  Abdominal Pain   HISTORY OF PRESENT ILLNESS  12/30/21 12:03 AM Brandon [name] is a 66 y.o. male with right upper quadrant abdominal pain that began after lunch yesterday.  The pain has been constant and he rates it as a 7 out of 10.  It is worse with movement or palpation.  There is also rebound pain when he removes pressure from his right upper quadrant.  His abdomen feels distended and he has been burping and passing gas.  He has been nauseated but having no vomiting.   Past Medical History:  Diagnosis Date   Anxiety    Basal cell carcinoma (BCC) in situ of skin    Colon polyps    Coronary artery disease    S/p DES to mRCA in 12/21 // S/p DES to LCx x 2 in 12/2020 // Diff dz in small to mod Dx (not a good target for PCI); OM1 100 CTO; severe dz in small OM2 and PDA >> Med Rx   Depression    Diabetes mellitus without complication (HCC)    HLD (hyperlipidemia)    Hypertension    SBO (small bowel obstruction) (HCC)     Past Surgical History:  Procedure Laterality Date   BACK SURGERY     CARDIAC CATHETERIZATION     CATARACT EXTRACTION W/ INTRAOCULAR LENS  IMPLANT, BILATERAL     CHOLECYSTECTOMY, LAPAROSCOPIC     CORONARY STENT INTERVENTION N/A 08/15/2020   Procedure: CORONARY STENT INTERVENTION;  Surgeon: Corky Crafts, MD;  Location: [facility] IN[facility]ASIVE CV [facility];  Service: Cardiovascular;  Laterality: N/A;   CORONARY STENT INTERVENTION N/A 01/17/2021   Procedure: CORONARY STENT INTERVENTION;  Surgeon: Kathleene Hazel, MD;  Location: [facility] IN[facility] CV [facility];  Service: Cardiovascular;  Laterality: N/A;   EYE SURGERY     INTRAVASCULAR ULTRASOUND/IVUS N/A 08/15/2020   Procedure: Intravascular Ultrasound/IVUS;  Surgeon: Corky Crafts, MD;  Location: [facility] INVASIVE CV LAB;  Service: Cardiovascular;  Laterality: N/A;    LEFT HEART CATH AND CORONARY ANGIOGRAPHY N/A 08/15/2020   Procedure: LEFT HEART CATH AND CORONARY ANGIOGRAPHY;  Surgeon: Corky Crafts, MD;  Location: [facility] INVASIVE CV LAB;  Service: Cardiovascular;  Laterality: N/A;   LEFT HEART CATH AND CORONARY ANGIOGRAPHY N/A 01/17/2021   Procedure: LEFT HEART CATH AND CORONARY ANGIOGRAPHY;  Surgeon: Kathleene Hazel, MD;  Location: [facility] INVASIVE CV LAB;  Service: Cardiovascular;  Laterality: N/A;   LUMBAR FUSION     L3-5 with rods   XI ROBOTIC ASSISTED INGUINAL HERNIA REPAIR WITH MESH     x 3 surgeries    Family History  Problem Relation Age of Onset   COPD Mother    Anxiety disorder Mother    Depression Mother    Cancer Father    Anxiety disorder Father    Depression Father    Other Father    Diabetes Neg Hx     Social History   Tobacco Use   Smoking status: Former    Types: Cigarettes    Quit date: 2007    Years since quitting: 16.3   Smokeless tobacco: Never  Vaping Use   Vaping Use: Never used  Substance Use Topics   Alcohol use: Not Currently   Drug use: Not Currently    Prior to Admission medications   Medication Sig Start Date End Date Taking? Authorizing Provider  albuterol (VENTOLIN HFA) 108 (90 Base) MCG/ACT inhaler Inhale 2 puffs by mouth  into the lungs every 6 (six) hours as needed for wheezing. 10/29/21   Monica Becton, MD  ARIPiprazole (ABILIFY) 10 MG tablet Take 1 tablet (10 mg total) by mouth every morning. 11/08/21   Everrett Coombe, DO  aspirin 81 MG EC tablet Take 81 mg by mouth every morning.    [provider]  atorvastatin (LIPITOR) 80 MG tablet TAKE 1 TABLET (80 MG TOTAL) BY MOUTH DAILY. Patient taking differently: Take 80 mg by mouth daily. 07/17/21 07/17/22  Meriam Sprague, MD  b complex vitamins capsule Take 1 capsule by mouth daily.    [provider]  buprenorphine (BUTRANS) 20 MCG/HR PTWK Place 1 patch onto the skin once a week. 10/11/21 01/31/22  Edward Jolly, MD   buPROPion (WELLBUTRIN XL) 300 MG 24 hr tablet Take 1 tablet (300 mg total) by mouth daily. 12/03/21   Everrett Coombe, DO  clopidogrel (PLAVIX) 75 MG tablet TAKE 1 TABLET (75 MG TOTAL) BY MOUTH DAILY WITH BREAKFAST. Patient taking differently: Take 75 mg by mouth daily with breakfast. 07/17/21 07/17/22  Meriam Sprague, MD  Continuous Blood Gluc Sensor (DEXCOM G6 SENSOR) MISC Use as directed. Change sensor every 10 days 11/25/20   Romero Belling, MD  empagliflozin (JARDIANCE) 25 MG TABS tablet Take 1 tablet (25 mg total) by mouth daily. 11/08/21   Everrett Coombe, DO  insulin lispro (HUMALOG) 100 UNIT/ML injection For use in pump, total of 60 units per day 10/19/21   Romero Belling, MD  Iron, Ferrous Sulfate, 325 (65 Fe) MG TABS Take 325 mg by mouth daily. 10/24/21   Everrett Coombe, DO  isosorbide mononitrate (IMDUR) 30 MG 24 hr tablet Take 1 tablet (30 mg total) by mouth daily. 07/17/21   Meriam Sprague, MD  lidocaine (LIDODERM) 5 % Apply 1 - 3 patches on to the skin as directed. 12 hours on and 12 hours off. 08/10/21     metoCLOPramide (REGLAN) 10 MG tablet Take 10 mg by mouth daily as needed for nausea or vomiting (upset stomach).    [provider]  metoprolol succinate (TOPROL XL) 25 MG 24 hr tablet Take 1 tablet (25 mg total) by mouth daily. 10/24/21   Meriam Sprague, MD  ondansetron (ZOFRAN-ODT) 4 MG disintegrating tablet Take 1 tablet (4 mg total) by mouth every 8 (eight) hours as needed for nausea or vomiting. 12/12/21   Everrett Coombe, DO  pantoprazole (PROTONIX) 40 MG tablet Take 1 tablet (40 mg total) by mouth daily. 12/03/21   Everrett Coombe, DO  Semaglutide, 2 MG/DOSE, (OZEMPIC, 2 MG/DOSE,) 8 MG/3ML SOPN Inject 2 mg into the skin once a week. 05/29/21   Romero Belling, MD  tamsulosin (FLOMAX) 0.4 MG CAPS capsule Take 1 capsule (0.4 mg total) by mouth daily. 12/03/21   Everrett Coombe, DO  testosterone (ANDROGEL) 50 MG/5GM (1%) GEL Place 5 g (1 packet) onto the skin daily. Apply  to shoulders and upper arms 11/27/21 12/30/21  Christen Butter, NP  tiZANidine (ZANAFLEX) 4 MG tablet Take 2 tablets (8 mg total) by mouth every 8 (eight) hours as needed for muscle spasms. 11/02/21   Burnadette Pop, MD    Allergies Kiwi extract, Other, Penicillins, Peach [prunus persica], Ancef [cefazolin], Latex, and Levaquin [levofloxacin]   REVIEW OF SYSTEMS  Negative except as noted here or in the History of Present Illness.   PHYSICAL EXAMINATION  Initial Vital Signs Blood pressure (!) 160/85, pulse  92, temperature 98.1 F (36.7 C), resp. rate (!) 21, height 5\' 9"  (1.753 m), weight 83.9 kg, SpO2 92 %.  Examination General: Well-developed, well-nourished male in no acute distress; appearance consistent with age of record HENT: normocephalic; atraumatic Eyes: Normal appearance Neck: supple Heart: regular rate and rhythm Lungs: clear to auscultation bilaterally Abdomen: soft; nondistended; right upper quadrant tenderness with rebound; bowel sounds present Extremities: No deformity; full range of motion; pulses normal Neurologic: Awake, alert and oriented; motor function intact in all extremities and symmetric; no facial droop Skin: Warm and dry Psychiatric: Normal mood and affect   RESULTS  Summary of this visit's results, reviewed and interpreted by myself:   EKG Interpretation  Date/Time:  Saturday Dec 29 2021 20:26:14 EDT Ventricular Rate:  95 PR Interval:  142 QRS Duration: 84 QT Interval:  356 QTC Calculation: 447 R Axis:   8 Text Interpretation: Normal sinus rhythm Normal ECG No significant change was found Confirmed by Paula Libra ([id number]) on 12/30/2021 12:04:57 AM       Laboratory Studies: Results for orders placed or performed during the hospital encounter of 12/29/21 (from the past 24 hour(s))  POC CBG, ED     Status: Abnormal   Collection Time: 12/29/21  8:33 PM  Result Value Ref Range   Glucose-Capillary 107 (H) 70 - 99 mg/dL  Troponin I (High Sensitivity)      Status: None   Collection Time: 12/29/21  8:51 PM  Result Value Ref Range   Troponin I (High Sensitivity) 4 <18 ng/L  Comprehensive metabolic panel     Status: Abnormal   Collection Time: 12/29/21  8:51 PM  Result Value Ref Range   Sodium 139 135 - 145 mmol/L   Potassium 3.4 (L) 3.5 - 5.1 mmol/L   Chloride 106 98 - 111 mmol/L   CO2 25 22 - 32 mmol/L   Glucose, Bld 118 (H) 70 - 99 mg/dL   BUN 7 (L) 8 - 23 mg/dL   Creatinine, Ser 6.21 0.61 - 1.24 mg/dL   Calcium 9.3 8.9 - 30.8 mg/dL   Total Protein 7.7 6.5 - 8.1 g/dL   Albumin 4.1 3.5 - 5.0 g/dL   AST 22 15 - 41 U/L   ALT 15 0 - 44 U/L   Alkaline Phosphatase 79 38 - 126 U/L   Total Bilirubin 0.7 0.3 - 1.2 mg/dL   GFR, Estimated >65 >78 mL/min   Anion gap 8 5 - 15  CBC with Differential     Status: Abnormal   Collection Time: 12/29/21  8:51 PM  Result Value Ref Range   WBC 10.2 4.0 - 10.5 K/uL   RBC 5.51 4.22 - 5.81 MIL/uL   Hemoglobin 16.8 13.0 - 17.0 g/dL   HCT 46.9 62.9 - 52.8 %   MCV 89.8 80.0 - 100.0 fL   MCH 30.5 26.0 - 34.0 pg   MCHC 33.9 30.0 - 36.0 g/dL   RDW 41.3 24.4 - 01.0 %   Platelets 233 150 - 400 K/uL   nRBC 0.0 0.0 - 0.2 %   Neutrophils Relative % 77 %   Neutro Abs 7.9 (H) 1.7 - 7.7 K/uL   Lymphocytes Relative 12 %   Lymphs Abs 1.2 0.7 - 4.0 K/uL   Monocytes Relative 10 %   Monocytes Absolute 1.0 0.1 - 1.0 K/uL   Eosinophils Relative 1 %   Eosinophils Absolute 0.1 0.0 - 0.5 K/uL   Basophils Relative 0 %   Basophils Absolute 0.0 0.0 - 0.1  K/uL   Immature Granulocytes 0 %   Abs Immature Granulocytes 0.02 0.00 - 0.07 K/uL  Lipase, blood     Status: None   Collection Time: 12/29/21  8:51 PM  Result Value Ref Range   Lipase 42 11 - 51 U/L  Troponin I (High Sensitivity)     Status: None   Collection Time: 12/29/21 11:08 PM  Result Value Ref Range   Troponin I (High Sensitivity) 4 <18 ng/L   Imaging Studies: DG Chest 2 View  Result Date: 12/29/2021 CLINICAL DATA:  Chest pain EXAM: CHEST - 2 VIEW  COMPARISON:  10/31/2021 FINDINGS: The heart size and mediastinal contours are within normal limits. No focal airspace consolidation, pleural effusion, or pneumothorax. The visualized skeletal structures are unremarkable. Interval placement of thoracic spinal stimulator with leads in the mid to lower thoracic spine. IMPRESSION: No active cardiopulmonary disease. Electronically Signed   By: Duanne Guess D.O.   On: 12/29/2021 20:46   CT ABDOMEN PELVIS W CONTRAST  Result Date: 12/30/2021 CLINICAL DATA:  Acute abdominal pain. EXAM: CT ABDOMEN AND PELVIS WITH CONTRAST TECHNIQUE: Multidetector CT imaging of the abdomen and pelvis was performed using the standard protocol following bolus administration of intravenous contrast. RADIATION DOSE REDUCTION: This exam was performed according to the departmental dose-optimization program which includes automated exposure control, adjustment of the mA and/or kV according to patient size and/or use of iterative reconstruction technique. CONTRAST:  80mL OMNIPAQUE IOHEXOL 300 MG/ML  SOLN COMPARISON:  CT angiogram chest abdomen and pelvis 08/11/2020. FINDINGS: Lower chest: There is atelectasis in the lung bases. Hepatobiliary: No focal liver abnormality is seen. Status post cholecystectomy. No biliary dilatation. Pancreas: Unremarkable. No pancreatic ductal dilatation or surrounding inflammatory changes. Spleen: Normal in size without focal abnormality. There are calcified granulomas in the spleen. Adrenals/Urinary Tract: 2.7 cm cyst again seen in the right kidney. There is no hydronephrosis or perinephric stranding. Adrenal glands and bladder are within normal limits. Stomach/Bowel: Stomach is dilated with air-fluid level. There are multiple dilated small bowel loops with air-fluid levels measuring up to 3.9 cm in diameter. Definitive transition point is not located, but ileal loops are decompressed in the right abdomen. There is mesenteric edema associated with dilated small  bowel. No pneumatosis or free air. Colon is nondilated. Appendix is within normal limits. There is colonic diverticulosis without evidence for acute diverticulitis. Vascular/Lymphatic: Aortic atherosclerosis. No enlarged abdominal or pelvic lymph nodes. Reproductive: Prostate gland is prominent in size. Penile pump present. Other: Trace free fluid in the right upper quadrant. Small fat containing bilateral inguinal hernias. Musculoskeletal: L4-5 posterior fusion hardware is present. IMPRESSION: 1. Mid small bowel obstruction. Definitive transition point not visualized. Mesenteric edema is present which can be seen with vascular compromise. 2. Trace free fluid in the right upper quadrant. 3. Colonic diverticulosis without evidence for acute diverticulitis. 4.  Aortic Atherosclerosis (ICD10-I70.0). Electronically Signed   By: Darliss Cheney M.D.   On: 12/30/2021 01:06    ED COURSE and MDM  Nursing notes, initial and subsequent vitals signs, including pulse oximetry, reviewed and interpreted by myself.  Vitals:   12/29/21 2028 12/29/21 2248 12/29/21 2330 12/30/21 0000  BP: (!) 151/73 (!) 141/68 (!) 160/85 (!) 145/87  Pulse: 97 89 92 89  Resp: 18 18 (!) 21 (!) 21  Temp: 98.1 F (36.7 C)     SpO2: 97% 94% 92% 91%  Weight:      Height:       Medications  ondansetron (ZOFRAN) injection 4 mg (  4 mg Intravenous Given 12/29/21 2315)  metoCLOPramide (REGLAN) injection 10 mg (10 mg Intravenous Given 12/30/21 0029)  fentaNYL (SUBLIMAZE) injection 100 mcg (100 mcg Intravenous Given 12/30/21 0029)  iohexol (OMNIPAQUE) 300 MG/ML solution 80 mL (80 mLs Intravenous Contrast Given 12/30/21 0054)   Dr. Leafy Half to admit to hospitalist service.  The patient is not vomiting and I do not believe NG tube is necessary at this time.  General surgery will be consulted in the morning.   PROCEDURES  Procedures   ED DIAGNOSES     ICD-10-CM   1. SBO (small bowel obstruction) (HCC)  K56.609          Cuahutemoc Attar, Jonny Ruiz,  MD 12/30/21 [phone]

## 2021-12-30 NOTE — Progress Notes (Signed)
Plan of Care Note for accepted transfer ? ? ?Patient: Brandon [name] MRN: [id number]   DOA: 12/29/2021 ? ?Facility requesting transfer: [facility] ?Requesting Provider: Dr. Read Drivers ?Reason for transfer: SBO ?Facility course:  ? ?66 year old male with past medical history of coronary artery disease status post PCI to the RCA and LCx, hypertension, diabetes mellitus type 2, hyperlipidemia, obstructive sleep apnea on CPAP and who presented to med [facility] emergency department with complaints of abdominal pain and chest pain. ? ?Patient explained that he began to develop right upper quadrant abdominal pain and concurrent chest discomfort after lunch on 5/5.  This pain was severe in intensity and persisted for over 24 hours before the patient presented to the [facility] emergency department for evaluation. ? ?Upon evaluation in the emergency department CT imaging of the abdomen and pelvis revealed small bowel obstruction without a clear definitive transition point and evidence of mesenteric edema. ? ?In the emergency department patient was relatively pain-free without vomiting and therefore NG tube was not placed.  Patient has been kept NPO.  ER provider has sent a secure chat nonurgent consultation requested general surgery. ? ?Plan of care: ?The patient is accepted for admission to Telemetry unit, at first available campus. ? ?Author: ?Marinda Elk, MD ?12/30/2021 ? ?Check [email or web address] for on-call coverage. ? ?Nursing staff, Please call [facility] Admits & Consults System-Wide number on Amion as soon as patient's arrival, so appropriate admitting provider can evaluate the pt. ?

## 2021-12-30 NOTE — H&P (Signed)
History and Physical    Patient: Brandon [name] ZOX:[id number] DOB: 05/22/1956 DOA: 12/29/2021 DOS: the patient was seen and examined on 12/30/2021 PCP: Everrett Coombe, DO  Patient coming from: Home - lives with wife and 2 children; NOK: Wife, [phone]   Chief Complaint: Abdominal pain  HPI: Brandon [name] is a 66 y.o. male with medical history significant of anxiety/depression; CAD s/p stents; DM; HTN; OSA on CPAP; and HLD presenting with abdominal pain. He reports that he went to his PCP Thursday after a bout with "gastroenteritis" (he now appreciates that the symptoms were similar to today's presentation).  Saturday about noon he developed abdominal pain and significant flatulence, nausea, inability to tolerate PO.  Last night, he had enough and he went to the ER.  He has had a prior SBO about 2 years ago, similar presentation.  Then he had lots of vomiting and diarrhea and got dehydrated.  He had an NG tube but no surgery.  He had a h/o cholecystectomy (gangrenous and adherent) and triple hernia repair.  He is developing some pain now.  He is having mild nausea, no vomiting.    ER Course:  [facility] to [facility] transfer, per Dr. Leafy Half:  RUQ pain and concurrent chest discomfort after lunch on 5/5.  This pain was severe in intensity and persisted for over 24 hours before the patient presented.CT revealed small bowel obstruction without a clear definitive transition point and evidence of mesenteric edema.  In the emergency department patient was relatively pain-free without vomiting and therefore NG tube was not placed.  Patient has been kept NPO.  Secure chat message sent to general surgery requesting formal consultation.     Review of Systems: As mentioned in the history of present illness. All other systems reviewed and are negative. Past Medical History:  Diagnosis Date   Anxiety    Basal cell carcinoma (BCC) in situ of skin    Chronic back pain    has spinal cord stimulator and wears  buprenorphine patch   Colon polyps    Coronary artery disease    S/p DES to mRCA in 12/21 // S/p DES to LCx x 2 in 12/2020 // Diff dz in small to mod Dx (not a good target for PCI); OM1 100 CTO; severe dz in small OM2 and PDA >> Med Rx   Depression    Diabetes mellitus without complication (HCC)    HLD (hyperlipidemia)    Hypertension    SBO (small bowel obstruction) (HCC)    Past Surgical History:  Procedure Laterality Date   BACK SURGERY     CARDIAC CATHETERIZATION     CATARACT EXTRACTION W/ INTRAOCULAR LENS  IMPLANT, BILATERAL     CHOLECYSTECTOMY, LAPAROSCOPIC     CORONARY STENT INTERVENTION N/A 08/15/2020   Procedure: CORONARY STENT INTERVENTION;  Surgeon: Corky Crafts, MD;  Location: [facility] INVASIVE CV LAB;  Service: Cardiovascular;  Laterality: N/A;   CORONARY STENT INTERVENTION N/A 01/17/2021   Procedure: CORONARY STENT INTERVENTION;  Surgeon: Kathleene Hazel, MD;  Location: [facility] INVASIVE CV LAB;  Service: Cardiovascular;  Laterality: N/A;   EYE SURGERY     INTRAVASCULAR ULTRASOUND/IVUS N/A 08/15/2020   Procedure: Intravascular Ultrasound/IVUS;  Surgeon: Corky Crafts, MD;  Location: [facility] INVASIVE CV LAB;  Service: Cardiovascular;  Laterality: N/A;   LEFT HEART CATH AND CORONARY ANGIOGRAPHY N/A 08/15/2020   Procedure: LEFT HEART CATH AND CORONARY ANGIOGRAPHY;  Surgeon: Corky Crafts, MD;  Location: [facility] INVASIVE CV LAB;  Service: Cardiovascular;  Laterality: N/A;   LEFT HEART CATH AND CORONARY ANGIOGRAPHY N/A 01/17/2021   Procedure: LEFT HEART CATH AND CORONARY ANGIOGRAPHY;  Surgeon: Kathleene Hazel, MD;  Location: [facility] INVASIVE CV LAB;  Service: Cardiovascular;  Laterality: N/A;   LUMBAR FUSION     L3-5 with rods   XI ROBOTIC ASSISTED INGUINAL HERNIA REPAIR WITH MESH     x 3 surgeries   Social History:  reports that he quit smoking about 16 years ago. His smoking use included cigarettes. He has never used smokeless tobacco. He reports that he does not  currently use alcohol. He reports that he does not currently use drugs.  Allergies  Allergen Reactions   Kiwi Extract Swelling    Mouth swelling.    Other Swelling and Other (See Comments)    EGGPLANT. Mouth swelling.   Penicillins Rash    Reaction: unknown   Peach [Prunus Persica] Other (See Comments)    Burns mouth   Ancef [Cefazolin] Rash   Latex Rash   Levaquin [Levofloxacin] Rash    Family History  Problem Relation Age of Onset   COPD Mother    Anxiety disorder Mother    Depression Mother    Cancer Father    Anxiety disorder Father    Depression Father    Other Father    Diabetes Neg Hx     Prior to Admission medications   Medication Sig Start Date End Date Taking? Authorizing Provider  albuterol (VENTOLIN HFA) 108 (90 Base) MCG/ACT inhaler Inhale 2 puffs by mouth  into the lungs every 6 (six) hours as needed for wheezing. 10/29/21   Monica Becton, MD  ARIPiprazole (ABILIFY) 10 MG tablet Take 1 tablet (10 mg total) by mouth every morning. 11/08/21   Everrett Coombe, DO  aspirin 81 MG EC tablet Take 81 mg by mouth every morning.    [provider]  atorvastatin (LIPITOR) 80 MG tablet TAKE 1 TABLET (80 MG TOTAL) BY MOUTH DAILY. Patient taking differently: Take 80 mg by mouth daily. 07/17/21 07/17/22  Meriam Sprague, MD  b complex vitamins capsule Take 1 capsule by mouth daily.    [provider]  buprenorphine (BUTRANS) 20 MCG/HR PTWK Place 1 patch onto the skin once a week. 10/11/21 01/31/22  Edward Jolly, MD  buPROPion (WELLBUTRIN XL) 300 MG 24 hr tablet Take 1 tablet (300 mg total) by mouth daily. 12/03/21   Everrett Coombe, DO  clopidogrel (PLAVIX) 75 MG tablet TAKE 1 TABLET (75 MG TOTAL) BY MOUTH DAILY WITH BREAKFAST. Patient taking differently: Take 75 mg by mouth daily with breakfast. 07/17/21 07/17/22  Meriam Sprague, MD  Continuous Blood Gluc Sensor (DEXCOM G6 SENSOR) MISC Use as directed. Change sensor every 10 days 11/25/20    Romero Belling, MD  empagliflozin (JARDIANCE) 25 MG TABS tablet Take 1 tablet (25 mg total) by mouth daily. 11/08/21   Everrett Coombe, DO  insulin lispro (HUMALOG) 100 UNIT/ML injection For use in pump, total of 60 units per day 10/19/21   Romero Belling, MD  Iron, Ferrous Sulfate, 325 (65 Fe) MG TABS Take 325 mg by mouth daily. 10/24/21   Everrett Coombe, DO  isosorbide mononitrate (IMDUR) 30 MG 24 hr tablet Take 1 tablet (30 mg total) by mouth daily. 07/17/21   Meriam Sprague, MD  lidocaine (LIDODERM) 5 % Apply 1 - 3 patches on to the skin as directed. 12 hours on and 12 hours off. 08/10/21     metoCLOPramide (REGLAN) 10 MG tablet Take  10 mg by mouth daily as needed for nausea or vomiting (upset stomach).    [provider]  metoprolol succinate (TOPROL XL) 25 MG 24 hr tablet Take 1 tablet (25 mg total) by mouth daily. 10/24/21   Meriam Sprague, MD  ondansetron (ZOFRAN-ODT) 4 MG disintegrating tablet Take 1 tablet (4 mg total) by mouth every 8 (eight) hours as needed for nausea or vomiting. 12/12/21   Everrett Coombe, DO  pantoprazole (PROTONIX) 40 MG tablet Take 1 tablet (40 mg total) by mouth daily. 12/03/21   Everrett Coombe, DO  Semaglutide, 2 MG/DOSE, (OZEMPIC, 2 MG/DOSE,) 8 MG/3ML SOPN Inject 2 mg into the skin once a week. 05/29/21   Romero Belling, MD  tamsulosin (FLOMAX) 0.4 MG CAPS capsule Take 1 capsule (0.4 mg total) by mouth daily. 12/03/21   Everrett Coombe, DO  testosterone (ANDROGEL) 50 MG/5GM (1%) GEL Place 5 g (1 packet) onto the skin daily. Apply to shoulders and upper arms 11/27/21 12/30/21  Christen Butter, NP  tiZANidine (ZANAFLEX) 4 MG tablet Take 2 tablets (8 mg total) by mouth every 8 (eight) hours as needed for muscle spasms. 11/02/21   Burnadette Pop, MD    Physical Exam: Vitals:   12/30/21 0400 12/30/21 0515 12/30/21 0600 12/30/21 0800  BP: 135/76   138/74  Pulse: 91 98  94  Resp: 16 18 18 18   Temp:  98.7 F (37.1 C)    TempSrc:  Oral    SpO2: 93% 94% 91% 95%   Weight:      Height:       General:  Appears calm and comfortable and is in NAD Eyes:  EOMI, normal lids, iris ENT:  grossly normal hearing, lips & tongue, mmm Neck:  no LAD, masses or thyromegaly Cardiovascular:  RRR, no m/r/g. No LE edema.  Respiratory:   CTA bilaterally with no wheezes/rales/rhonchi.  Normal respiratory effort. Abdomen:  soft, NT, ND, hyperactive BS other than in LLQ; Dexcom and insulin pump both attached to anterior abdomen Skin:  no rash or induration seen on limited exam Musculoskeletal:  grossly normal tone BUE/BLE, good ROM, no bony abnormality Psychiatric:  grossly normal mood and affect, speech fluent and appropriate, AOx3 Neurologic:  CN 2-12 grossly intact, moves all extremities in coordinated fashion   Radiological Exams on Admission: Independently reviewed - see discussion in A/P where applicable  DG Chest 2 View  Result Date: 12/29/2021 CLINICAL DATA:  Chest pain EXAM: CHEST - 2 VIEW COMPARISON:  10/31/2021 FINDINGS: The heart size and mediastinal contours are within normal limits. No focal airspace consolidation, pleural effusion, or pneumothorax. The visualized skeletal structures are unremarkable. Interval placement of thoracic spinal stimulator with leads in the mid to lower thoracic spine. IMPRESSION: No active cardiopulmonary disease. Electronically Signed   By: Duanne Guess D.O.   On: 12/29/2021 20:46   CT ABDOMEN PELVIS W CONTRAST  Result Date: 12/30/2021 CLINICAL DATA:  Acute abdominal pain. EXAM: CT ABDOMEN AND PELVIS WITH CONTRAST TECHNIQUE: Multidetector CT imaging of the abdomen and pelvis was performed using the standard protocol following bolus administration of intravenous contrast. RADIATION DOSE REDUCTION: This exam was performed according to the departmental dose-optimization program which includes automated exposure control, adjustment of the mA and/or kV according to patient size and/or use of iterative reconstruction technique.  CONTRAST:  80mL OMNIPAQUE IOHEXOL 300 MG/ML  SOLN COMPARISON:  CT angiogram chest abdomen and pelvis 08/11/2020. FINDINGS: Lower chest: There is atelectasis in the lung bases. Hepatobiliary: No focal liver abnormality is seen.  Status post cholecystectomy. No biliary dilatation. Pancreas: Unremarkable. No pancreatic ductal dilatation or surrounding inflammatory changes. Spleen: Normal in size without focal abnormality. There are calcified granulomas in the spleen. Adrenals/Urinary Tract: 2.7 cm cyst again seen in the right kidney. There is no hydronephrosis or perinephric stranding. Adrenal glands and bladder are within normal limits. Stomach/Bowel: Stomach is dilated with air-fluid level. There are multiple dilated small bowel loops with air-fluid levels measuring up to 3.9 cm in diameter. Definitive transition point is not located, but ileal loops are decompressed in the right abdomen. There is mesenteric edema associated with dilated small bowel. No pneumatosis or free air. Colon is nondilated. Appendix is within normal limits. There is colonic diverticulosis without evidence for acute diverticulitis. Vascular/Lymphatic: Aortic atherosclerosis. No enlarged abdominal or pelvic lymph nodes. Reproductive: Prostate gland is prominent in size. Penile pump present. Other: Trace free fluid in the right upper quadrant. Small fat containing bilateral inguinal hernias. Musculoskeletal: L4-5 posterior fusion hardware is present. IMPRESSION: 1. Mid small bowel obstruction. Definitive transition point not visualized. Mesenteric edema is present which can be seen with vascular compromise. 2. Trace free fluid in the right upper quadrant. 3. Colonic diverticulosis without evidence for acute diverticulitis. 4.  Aortic Atherosclerosis (ICD10-I70.0). Electronically Signed   By: Darliss Cheney M.D.   On: 12/30/2021 01:06    EKG: Independently reviewed.  NSR with rate 95; no evidence of acute ischemia   Labs on Admission: I have  personally reviewed the available labs and imaging studies at the time of the admission.  Pertinent labs:    Unremarkable CMP Lactate 1.0 HS troponin 4, 4 Normal CBC   Assessment and Plan: Principal Problem:   Small bowel obstruction (HCC) Active Problems:   Primary hypertension   Coronary artery disease   Type 2 diabetes mellitus with other specified complication (HCC)   Spinal cord stimulator status   Status post coronary artery stent placement   Mixed hyperlipidemia   OSA (obstructive sleep apnea)   Chronic pain syndrome    SBO -Patient with prior h/o abdominal surgeries and prior SBO presenting with acute onset of abdominal pain with nausea, abdominal distention, and CT findings c/w SBO -Will admit to Med Surg -Gen Surg consulted; currently no indication for surgical intervention -80% of SBO will resolve without surgery -High-grade SBO can usually be safely managed non-operatively -If PO contrast reaches colon within 24 hours, SBO will very likely certainly resolve without surgery -NPO for bowel rest -NG tube deferred for now since patient is not having active n/v -IVF hydration -Pain control with morphine -Current guidelines recommend that patients without resolution undergo surgery by 3-5 days  CAD -s/p stents in 2021 and 2022 -Denies current CP -Continue ASA but will hold Plavix in case of need for surgical intervention - resume as soon as appropriate -Continue Imdur  Chronic pain -I have reviewed this patient in the [place] Controlled Substances Reporting System.  He is receiving medications from only one provider and appears to be taking them as prescribed. -He is at increased high risk of opioid misuse, diversion, or overdose.  -Continue buprenorphine -Also has spinal cord stimulator -Pain control with oxy, morphine to start; he may require increased dose of opioids given his chronic pain as well as possible antagonism by buprenorphine  DM -He is on an insulin  pump at home, will continue -Hold Jardiance  HTN -Continue Toprol XL  Anxiety/depression -Continue home meds - Abilify, Wellbutrin  HLD -Continue Lipitor  OSA -Continue CPAP  Advance Care Planning:   Code Status: Full Code   Consults: Surgery  DVT Prophylaxis: SCDs  Family Communication: Wife ([facility] RN) was present throughout evaluation  Severity of Illness: The appropriate patient status for this patient is INPATIENT. Inpatient status is judged to be reasonable and necessary in order to provide the required intensity of service to ensure the patient's safety. The patient's presenting symptoms, physical exam findings, and initial radiographic and laboratory data in the context of their chronic comorbidities is felt to place them at high risk for further clinical deterioration. Furthermore, it is not anticipated that the patient will be medically stable for discharge from the hospital within 2 midnights of admission.   * I certify that at the point of admission it is my clinical judgment that the patient will require inpatient hospital care spanning beyond 2 midnights from the point of admission due to high intensity of service, high risk for further deterioration and high frequency of surveillance required.*  Author: Jonah Blue, MD 12/30/2021 1:23 PM  For on call review [email or web address].

## 2021-12-30 NOTE — Consult Note (Signed)
Brandon [name] 08-20-1956  [id number].    Requesting MD: yates, md Chief Complaint/Reason for Consult: SBO  HPI:  Brandon [name] is a 66 year old male with a past medical history of chronic back pain, hyperlipidemia, CAD status post stents 12/2020 on Plavix, hypertension, diabetes, and SBO who presented to the hospital with a chief complaint of abdominal pain and constipation.  Patient tells me he started having upper abdominal pain on Monday, 5/1, associated with nausea and constipation.  Denies vomiting.  States he continued to have flatus during this time.  Says he saw Dr. Ashley Royalty on Thursday who suspected it was gastritis.  Reports having 1 good day on Friday before his pain returned on Saturday and became more severe, prompting him to go to the emergency department.  Pain improved with IV fentanyl.  CT scan of the abdomen and pelvis revealed small bowel obstruction and general surgery was asked to consult.  Patient has a history of small bowel obstruction in August 2021 that resolved with nonoperative management.  Today during my exam the patient says he is having flatus and had a small, hard stool yesterday.  He denies nausea.  Abdominal pain improved, still tender to palpation per his report.  Past abdominal surgeries include cholecystectomy for gangrenous cholecystitis 08/2019 and robotic umbilical and bilateral inguinal hernia repairs in 2020.  Both of the surgery was performed in [place] where the patient used to live.  Patient states he moved to [place] 2 years ago.  Former smoker who quit in 2007.  Denies other drug and alcohol use.  Currently takes 81 mg aspirin and Plavix.  Last dose of Plavix was yesterday, 5/6, in the morning.  ROS: Review of Systems  Constitutional: Negative.   HENT: Negative.    Eyes: Negative.   Respiratory: Negative.    Cardiovascular: Negative.   Gastrointestinal:  Positive for abdominal pain, constipation and nausea. Negative for blood in stool,  diarrhea, melena and vomiting.  Genitourinary: Negative.   Musculoskeletal:  Positive for back pain.  Skin: Negative.   Neurological: Negative.   Psychiatric/Behavioral: Negative.     Family History  Problem Relation Age of Onset   COPD Mother    Anxiety disorder Mother    Depression Mother    Cancer Father    Anxiety disorder Father    Depression Father    Other Father    Diabetes Neg Hx     Past Medical History:  Diagnosis Date   Anxiety    Basal cell carcinoma (BCC) in situ of skin    Chronic back pain    has spinal cord stimulator and wears buprenorphine patch   Colon polyps    Coronary artery disease    S/p DES to mRCA in 12/21 // S/p DES to LCx x 2 in 12/2020 // Diff dz in small to mod Dx (not a good target for PCI); OM1 100 CTO; severe dz in small OM2 and PDA >> Med Rx   Depression    Diabetes mellitus without complication (HCC)    HLD (hyperlipidemia)    Hypertension    SBO (small bowel obstruction) (HCC)     Past Surgical History:  Procedure Laterality Date   BACK SURGERY     CARDIAC CATHETERIZATION     CATARACT EXTRACTION W/ INTRAOCULAR LENS  IMPLANT, BILATERAL     CHOLECYSTECTOMY, LAPAROSCOPIC     CORONARY STENT INTERVENTION N/A 08/15/2020   Procedure: CORONARY STENT INTERVENTION;  Surgeon: Corky Crafts, MD;  Location: [facility] INVASIVE  CV LAB;  Service: Cardiovascular;  Laterality: N/A;   CORONARY STENT INTERVENTION N/A 01/17/2021   Procedure: CORONARY STENT INTERVENTION;  Surgeon: Kathleene Hazel, MD;  Location: [facility] INVASIVE CV LAB;  Service: Cardiovascular;  Laterality: N/A;   EYE SURGERY     INTRAVASCULAR ULTRASOUND/IVUS N/A 08/15/2020   Procedure: Intravascular Ultrasound/IVUS;  Surgeon: Corky Crafts, MD;  Location: [facility] INVASIVE CV LAB;  Service: Cardiovascular;  Laterality: N/A;   LEFT HEART CATH AND CORONARY ANGIOGRAPHY N/A 08/15/2020   Procedure: LEFT HEART CATH AND CORONARY ANGIOGRAPHY;  Surgeon: Corky Crafts, MD;  Location:  [facility] INVASIVE CV LAB;  Service: Cardiovascular;  Laterality: N/A;   LEFT HEART CATH AND CORONARY ANGIOGRAPHY N/A 01/17/2021   Procedure: LEFT HEART CATH AND CORONARY ANGIOGRAPHY;  Surgeon: Kathleene Hazel, MD;  Location: [facility] INVASIVE CV LAB;  Service: Cardiovascular;  Laterality: N/A;   LUMBAR FUSION     L3-5 with rods   XI ROBOTIC ASSISTED INGUINAL HERNIA REPAIR WITH MESH     x 3 surgeries    Social History:  reports that he quit smoking about 16 years ago. His smoking use included cigarettes. He has never used smokeless tobacco. He reports that he does not currently use alcohol. He reports that he does not currently use drugs.  Allergies:  Allergies  Allergen Reactions   Kiwi Extract Swelling    Mouth swelling.    Other Swelling and Other (See Comments)    EGGPLANT. Mouth swelling.   Penicillins Rash    Reaction: unknown   Peach [Prunus Persica] Other (See Comments)    Burns mouth   Ancef [Cefazolin] Rash   Latex Rash   Levaquin [Levofloxacin] Rash    Medications Prior to Admission  Medication Sig Dispense Refill   albuterol (VENTOLIN HFA) 108 (90 Base) MCG/ACT inhaler Inhale 2 puffs by mouth  into the lungs every 6 (six) hours as needed for wheezing. 17 g 11   ARIPiprazole (ABILIFY) 10 MG tablet Take 1 tablet (10 mg total) by mouth every morning. 90 tablet 1   aspirin 81 MG EC tablet Take 81 mg by mouth every morning.     atorvastatin (LIPITOR) 80 MG tablet TAKE 1 TABLET (80 MG TOTAL) BY MOUTH DAILY. (Patient taking differently: Take 80 mg by mouth daily.) 90 tablet 3   b complex vitamins capsule Take 1 capsule by mouth daily.     buprenorphine (BUTRANS) 20 MCG/HR PTWK Place 1 patch onto the skin once a week. 4 patch 3   buPROPion (WELLBUTRIN XL) 300 MG 24 hr tablet Take 1 tablet (300 mg total) by mouth daily. 90 tablet 0   clopidogrel (PLAVIX) 75 MG tablet TAKE 1 TABLET (75 MG TOTAL) BY MOUTH DAILY WITH BREAKFAST. (Patient taking differently: Take 75 mg by mouth daily  with breakfast.) 90 tablet 3   Continuous Blood Gluc Sensor (DEXCOM G6 SENSOR) MISC Use as directed. Change sensor every 10 days 9 each 3   empagliflozin (JARDIANCE) 25 MG TABS tablet Take 1 tablet (25 mg total) by mouth daily. 90 tablet 3   insulin lispro (HUMALOG) 100 UNIT/ML injection For use in pump, total of 60 units per day 60 mL 3   Iron, Ferrous Sulfate, 325 (65 Fe) MG TABS Take 325 mg by mouth daily. 90 tablet 1   isosorbide mononitrate (IMDUR) 30 MG 24 hr tablet Take 1 tablet (30 mg total) by mouth daily. 90 tablet 2   lidocaine (LIDODERM) 5 % Apply 1 - 3 patches on to the  skin as directed. 12 hours on and 12 hours off. 90 patch 2   metoCLOPramide (REGLAN) 10 MG tablet Take 10 mg by mouth daily as needed for nausea or vomiting (upset stomach).     metoprolol succinate (TOPROL XL) 25 MG 24 hr tablet Take 1 tablet (25 mg total) by mouth daily. 90 tablet 2   ondansetron (ZOFRAN-ODT) 4 MG disintegrating tablet Take 1 tablet (4 mg total) by mouth every 8 (eight) hours as needed for nausea or vomiting. 20 tablet 0   pantoprazole (PROTONIX) 40 MG tablet Take 1 tablet (40 mg total) by mouth daily. 90 tablet 0   Semaglutide, 2 MG/DOSE, (OZEMPIC, 2 MG/DOSE,) 8 MG/3ML SOPN Inject 2 mg into the skin once a week. 9 mL 3   tamsulosin (FLOMAX) 0.4 MG CAPS capsule Take 1 capsule (0.4 mg total) by mouth daily. 90 capsule 1   testosterone (ANDROGEL) 50 MG/5GM (1%) GEL Place 5 g (1 packet) onto the skin daily. Apply to shoulders and upper arms 150 g 0   tiZANidine (ZANAFLEX) 4 MG tablet Take 2 tablets (8 mg total) by mouth every 8 (eight) hours as needed for muscle spasms. 180 tablet      Physical Exam: Blood pressure 138/74, pulse 94, temperature 98.7 F (37.1 C), temperature source Oral, resp. rate 18, height 5\' 9"  (1.753 m), weight 83.9 kg, SpO2 95 %. General: Pleasant white male  laying on hospital bed, appears stated age, NAD. HEENT: head -normocephalic, atraumatic; Eyes: PERRLA, anicteric  sclerae Neck- Trachea is midline, no thyromegaly or JVD appreciated.  CV- RRR, pedal pulses 2+ BL Pulm- breathing is non-labored. CTABL, no wheezes, rhales, rhonchi. Abd- soft, mild distention, mild TTP upper abdomen and LLQ without rebound tenderness or guarding; penile pump present. MSK- UE/LE symmetrical, no cyanosis, clubbing, or edema. Neuro- CN II-XII grossly in tact, gait not assessed Psych- Alert and Oriented x3 with appropriate affect Skin: warm and dry, no rashes or lesions   Results for orders placed or performed during the hospital encounter of 12/29/21 (from the past 48 hour(s))  POC CBG, ED     Status: Abnormal   Collection Time: 12/29/21  8:33 PM  Result Value Ref Range   Glucose-Capillary 107 (H) 70 - 99 mg/dL    Comment: Glucose reference range applies only to samples taken after fasting for at least 8 hours.  Troponin I (High Sensitivity)     Status: None   Collection Time: 12/29/21  8:51 PM  Result Value Ref Range   Troponin I (High Sensitivity) 4 <18 ng/L    Comment: (NOTE) Elevated high sensitivity troponin I (hsTnI) values and significant  changes across serial measurements may suggest ACS but many other  chronic and acute conditions are known to elevate hsTnI results.  Refer to the "Links" section for chest pain algorithms and additional  guidance. Performed at [facility], [address] [place], [place] [address]   Comprehensive metabolic panel     Status: Abnormal   Collection Time: 12/29/21  8:51 PM  Result Value Ref Range   Sodium 139 135 - 145 mmol/L   Potassium 3.4 (L) 3.5 - 5.1 mmol/L   Chloride 106 98 - 111 mmol/L   CO2 25 22 - 32 mmol/L   Glucose, Bld 118 (H) 70 - 99 mg/dL    Comment: Glucose reference range applies only to samples taken after fasting for at least 8 hours.   BUN 7 (L) 8 - 23 mg/dL   Creatinine, Ser  0.67 0.61 - 1.24 mg/dL   Calcium 9.3 8.9 - 32.9 mg/dL   Total Protein 7.7 6.5 - 8.1 g/dL   Albumin 4.1 3.5 - 5.0  g/dL   AST 22 15 - 41 U/L   ALT 15 0 - 44 U/L   Alkaline Phosphatase 79 38 - 126 U/L   Total Bilirubin 0.7 0.3 - 1.2 mg/dL   GFR, Estimated >51 >88 mL/min    Comment: (NOTE) Calculated using the CKD-EPI Creatinine Equation (2021)    Anion gap 8 5 - 15    Comment: Performed at [facility], [address] [place], [place] [address]  CBC with Differential     Status: Abnormal   Collection Time: 12/29/21  8:51 PM  Result Value Ref Range   WBC 10.2 4.0 - 10.5 K/uL   RBC 5.51 4.22 - 5.81 MIL/uL   Hemoglobin 16.8 13.0 - 17.0 g/dL   HCT 63.0 16.0 - 10.9 %   MCV 89.8 80.0 - 100.0 fL   MCH 30.5 26.0 - 34.0 pg   MCHC 33.9 30.0 - 36.0 g/dL   RDW 32.3 55.7 - 32.2 %   Platelets 233 150 - 400 K/uL   nRBC 0.0 0.0 - 0.2 %   Neutrophils Relative % 77 %   Neutro Abs 7.9 (H) 1.7 - 7.7 K/uL   Lymphocytes Relative 12 %   Lymphs Abs 1.2 0.7 - 4.0 K/uL   Monocytes Relative 10 %   Monocytes Absolute 1.0 0.1 - 1.0 K/uL   Eosinophils Relative 1 %   Eosinophils Absolute 0.1 0.0 - 0.5 K/uL   Basophils Relative 0 %   Basophils Absolute 0.0 0.0 - 0.1 K/uL   Immature Granulocytes 0 %   Abs Immature Granulocytes 0.02 0.00 - 0.07 K/uL    Comment: Performed at [facility], [address] [place], [place] [address]  Lipase, blood     Status: None   Collection Time: 12/29/21  8:51 PM  Result Value Ref Range   Lipase 42 11 - 51 U/L    Comment: Performed at [facility], [address] [place], [place] [address]  Troponin I (High Sensitivity)     Status: None   Collection Time: 12/29/21 11:08 PM  Result Value Ref Range   Troponin I (High Sensitivity) 4 <18 ng/L    Comment: (NOTE) Elevated high sensitivity troponin I (hsTnI) values and significant  changes across serial measurements may suggest ACS but many other  chronic and acute conditions are known to elevate hsTnI results.  Refer to the "Links" section for chest pain algorithms and additional   guidance. Performed at [facility], [address] [place], [place] [address]   Lactic acid, plasma     Status: None   Collection Time: 12/30/21  1:17 AM  Result Value Ref Range   Lactic Acid, Venous 1.0 0.5 - 1.9 mmol/L    Comment: Performed at [facility], [address] [place], [place] [address]  Magnesium     Status: None   Collection Time: 12/30/21  6:04 AM  Result Value Ref Range   Magnesium 1.8 1.7 - 2.4 mg/dL    Comment: Performed at [facility], [address] [place], [place] [address]  Comprehensive metabolic panel     Status: Abnormal   Collection Time: 12/30/21  6:04 AM  Result Value Ref Range   Sodium 143 135 - 145 mmol/L  Potassium 3.5 3.5 - 5.1 mmol/L   Chloride 104 98 - 111 mmol/L   CO2 28 22 - 32 mmol/L   Glucose, Bld 103 (H) 70 - 99 mg/dL    Comment: Glucose reference range applies only to samples taken after fasting for at least 8 hours.   BUN 6 (L) 8 - 23 mg/dL   Creatinine, Ser 4.54 0.61 - 1.24 mg/dL   Calcium 9.5 8.9 - 09.8 mg/dL   Total Protein 7.0 6.5 - 8.1 g/dL   Albumin 3.7 3.5 - 5.0 g/dL   AST 23 15 - 41 U/L   ALT 14 0 - 44 U/L   Alkaline Phosphatase 69 38 - 126 U/L   Total Bilirubin 1.2 0.3 - 1.2 mg/dL   GFR, Estimated >11 >91 mL/min    Comment: (NOTE) Calculated using the CKD-EPI Creatinine Equation (2021)    Anion gap 11 5 - 15    Comment: Performed at [facility], [address] [place], [place] [address]  CBC with Differential/Platelet     Status: None   Collection Time: 12/30/21  6:04 AM  Result Value Ref Range   WBC 8.5 4.0 - 10.5 K/uL   RBC 5.39 4.22 - 5.81 MIL/uL   Hemoglobin 16.5 13.0 - 17.0 g/dL   HCT 56.2 13.0 - 86.5 %   MCV 89.6 80.0 - 100.0 fL   MCH 30.6 26.0 - 34.0 pg   MCHC 34.2 30.0 - 36.0 g/dL   RDW 78.4 69.6 - 29.5 %   Platelets 212 150 - 400 K/uL   nRBC 0.0 0.0 - 0.2 %   Neutrophils Relative % 74 %   Neutro Abs 6.3 1.7 - 7.7 K/uL   Lymphocytes Relative 13 %   Lymphs Abs  1.1 0.7 - 4.0 K/uL   Monocytes Relative 11 %   Monocytes Absolute 1.0 0.1 - 1.0 K/uL   Eosinophils Relative 2 %   Eosinophils Absolute 0.1 0.0 - 0.5 K/uL   Basophils Relative 0 %   Basophils Absolute 0.0 0.0 - 0.1 K/uL   Immature Granulocytes 0 %   Abs Immature Granulocytes 0.03 0.00 - 0.07 K/uL    Comment: Performed at [facility], [address], [place], [place] [address]   DG Chest 2 View  Result Date: 12/29/2021 CLINICAL DATA:  Chest pain EXAM: CHEST - 2 VIEW COMPARISON:  10/31/2021 FINDINGS: The heart size and mediastinal contours are within normal limits. No focal airspace consolidation, pleural effusion, or pneumothorax. The visualized skeletal structures are unremarkable. Interval placement of thoracic spinal stimulator with leads in the mid to lower thoracic spine. IMPRESSION: No active cardiopulmonary disease. Electronically Signed   By: Duanne Guess D.O.   On: 12/29/2021 20:46   CT ABDOMEN PELVIS W CONTRAST  Result Date: 12/30/2021 CLINICAL DATA:  Acute abdominal pain. EXAM: CT ABDOMEN AND PELVIS WITH CONTRAST TECHNIQUE: Multidetector CT imaging of the abdomen and pelvis was performed using the standard protocol following bolus administration of intravenous contrast. RADIATION DOSE REDUCTION: This exam was performed according to the departmental dose-optimization program which includes automated exposure control, adjustment of the mA and/or kV according to patient size and/or use of iterative reconstruction technique. CONTRAST:  80mL OMNIPAQUE IOHEXOL 300 MG/ML  SOLN COMPARISON:  CT angiogram chest abdomen and pelvis 08/11/2020. FINDINGS: Lower chest: There is atelectasis in the lung bases. Hepatobiliary: No focal liver abnormality is seen. Status post cholecystectomy. No biliary dilatation. Pancreas: Unremarkable. No pancreatic ductal dilatation or surrounding inflammatory changes. Spleen: Normal  in size without focal abnormality. There are calcified granulomas in the spleen.  Adrenals/Urinary Tract: 2.7 cm cyst again seen in the right kidney. There is no hydronephrosis or perinephric stranding. Adrenal glands and bladder are within normal limits. Stomach/Bowel: Stomach is dilated with air-fluid level. There are multiple dilated small bowel loops with air-fluid levels measuring up to 3.9 cm in diameter. Definitive transition point is not located, but ileal loops are decompressed in the right abdomen. There is mesenteric edema associated with dilated small bowel. No pneumatosis or free air. Colon is nondilated. Appendix is within normal limits. There is colonic diverticulosis without evidence for acute diverticulitis. Vascular/Lymphatic: Aortic atherosclerosis. No enlarged abdominal or pelvic lymph nodes. Reproductive: Prostate gland is prominent in size. Penile pump present. Other: Trace free fluid in the right upper quadrant. Small fat containing bilateral inguinal hernias. Musculoskeletal: L4-5 posterior fusion hardware is present. IMPRESSION: 1. Mid small bowel obstruction. Definitive transition point not visualized. Mesenteric edema is present which can be seen with vascular compromise. 2. Trace free fluid in the right upper quadrant. 3. Colonic diverticulosis without evidence for acute diverticulitis. 4.  Aortic Atherosclerosis (ICD10-I70.0). Electronically Signed   By: Darliss Cheney M.D.   On: 12/30/2021 01:06      Assessment/Plan pSBO  - history of multiple inguinal hernia repairs, umbilical hernia repair, cholecystectomy  - clinically he is partiall obstructed - having flatus and had one BM. Persistent abdominal distention. Will hold off on NG tube for now but low threshold to place NGT if he develops worsening nausea, emesis, or abdominal pain/distention. PO gastrografin for SBO protocol ordered.  - hopefully bowel function will continue to return and he will resolve with non-op mgmt. Hold plavix.    FEN - NPO, IVF, PO small bowel protocol VTE - SCD's, ok for chemical  DVT ppx from [facility] perspective ID - none Admit - [facility] service  Below per [facility]  Hypertension Hyperlipidemia CAD status post DES 12/2020 Chronic pain Diabetes Depression/anxiety   I reviewed nursing notes, ED provider notes, hospitalist notes, last 24 h vitals and pain scores, last 48 h intake and output, last 24 h labs and trends, and last 24 h imaging results.  Adam Phenix, PA-C [facility] 12/30/2021, 10:26 AM Please see Amion for pager number during day hours 7:00am-4:30pm or 7:00am -11:30am on weekends

## 2021-12-30 NOTE — ED Notes (Signed)
Patient transported to CT 

## 2021-12-31 ENCOUNTER — Inpatient Hospital Stay (HOSPITAL_COMMUNITY): Payer: No Typology Code available for payment source

## 2021-12-31 DIAGNOSIS — G894 Chronic pain syndrome: Secondary | ICD-10-CM

## 2021-12-31 DIAGNOSIS — I25118 Atherosclerotic heart disease of native coronary artery with other forms of angina pectoris: Secondary | ICD-10-CM | POA: Diagnosis not present

## 2021-12-31 DIAGNOSIS — K56609 Unspecified intestinal obstruction, unspecified as to partial versus complete obstruction: Secondary | ICD-10-CM | POA: Diagnosis not present

## 2021-12-31 LAB — GLUCOSE, CAPILLARY
Glucose-Capillary: 117 mg/dL — ABNORMAL HIGH (ref 70–99)
Glucose-Capillary: 128 mg/dL — ABNORMAL HIGH (ref 70–99)
Glucose-Capillary: 81 mg/dL (ref 70–99)
Glucose-Capillary: 83 mg/dL (ref 70–99)
Glucose-Capillary: 84 mg/dL (ref 70–99)
Glucose-Capillary: 85 mg/dL (ref 70–99)

## 2021-12-31 MED ORDER — PANTOPRAZOLE SODIUM 40 MG IV SOLR
40.0000 mg | INTRAVENOUS | Status: DC
  Administered 2021-12-31 – 2022-01-01 (×3): 40 mg via INTRAVENOUS
  Filled 2021-12-31 (×3): qty 10

## 2021-12-31 MED ORDER — KETOROLAC TROMETHAMINE 15 MG/ML IJ SOLN
15.0000 mg | Freq: Four times a day (QID) | INTRAMUSCULAR | Status: DC | PRN
  Administered 2021-12-31 – 2022-01-01 (×2): 15 mg via INTRAVENOUS
  Filled 2021-12-31 (×2): qty 1

## 2021-12-31 MED ORDER — HYDROMORPHONE HCL 1 MG/ML IJ SOLN
0.5000 mg | INTRAMUSCULAR | Status: DC | PRN
  Administered 2021-12-31 – 2022-01-01 (×5): 0.5 mg via INTRAVENOUS
  Filled 2021-12-31 (×5): qty 0.5

## 2021-12-31 MED ORDER — ENOXAPARIN SODIUM 40 MG/0.4ML IJ SOSY
40.0000 mg | PREFILLED_SYRINGE | INTRAMUSCULAR | Status: DC
  Administered 2022-01-01: 40 mg via SUBCUTANEOUS
  Filled 2021-12-31 (×2): qty 0.4

## 2021-12-31 NOTE — Progress Notes (Signed)
? ? ?   ?Subjective: ?CC: ?He still having some generalized abdominal pain and intermittent nausea but this is better than yesterday. Bloating/distension has nearly resolved. Passing flatus. Had a large bm this morning.  Feels similar to when prior SBO's resolved in the past. ? ?Objective: ?Vital signs in last 24 hours: ?Temp:  [97.8 ?F (36.6 ?C)-98.9 ?F (37.2 ?C)] 97.8 ?F (36.6 ?C) (05/08 [id number]) ?Pulse Rate:  [71-87] 71 (05/08 0808) ?Resp:  [16-19] 18 (05/08 [id number]) ?BP: (95-131)/(58-68) 108/58 (05/08 [id number]) ?SpO2:  [90 %-93 %] 92 % (05/08 0808) ?Last BM Date : 12/29/21 ? ?Intake/Output from previous day: ?05/07 0701 - 05/08 0700 ?In: 895.6 [I.V.:895.6] ?Out: -  ?Intake/Output this shift: ?No intake/output data recorded. ? ?PE: ?Gen:  Alert, NAD, pleasant ?Abd: Soft, mild distension, mild generalized tenderness, no peritonitis, +BS ?Psych: A&Ox3  ? ?Lab Results:  ?Recent Labs  ?  12/29/21 ?2051 12/30/21 ?0604  ?WBC 10.2 8.5  ?HGB 16.8 16.5  ?HCT 49.5 48.3  ?PLT 233 212  ? ?BMET ?Recent Labs  ?  12/29/21 ?2051 12/30/21 ?0604  ?NA 139 143  ?K 3.4* 3.5  ?CL 106 104  ?CO2 25 28  ?GLUCOSE 118* 103*  ?BUN 7* 6*  ?CREATININE 0.67 0.75  ?CALCIUM 9.3 9.5  ? ?PT/INR ?No results for input(s): LABPROT, INR in the last 72 hours. ?CMP  ?   ?Component Value Date/Time  ? NA 143 12/30/2021 0604  ? NA 140 01/02/2021 1014  ? K 3.5 12/30/2021 0604  ? CL 104 12/30/2021 0604  ? CO2 28 12/30/2021 0604  ? GLUCOSE 103 (H) 12/30/2021 0604  ? BUN 6 (L) 12/30/2021 0604  ? BUN 12 01/02/2021 1014  ? CREATININE 0.75 12/30/2021 0604  ? CREATININE 0.73 11/22/2021 0000  ? CALCIUM 9.5 12/30/2021 0604  ? PROT 7.0 12/30/2021 0604  ? PROT 7.0 10/23/2020 0824  ? ALBUMIN 3.7 12/30/2021 0604  ? ALBUMIN 4.5 10/23/2020 0824  ? AST 23 12/30/2021 0604  ? ALT 14 12/30/2021 0604  ? ALKPHOS 69 12/30/2021 0604  ? BILITOT 1.2 12/30/2021 0604  ? BILITOT 0.4 10/23/2020 0824  ? GFRNONAA >60 12/30/2021 0604  ? GFRAA >60 04/17/2020 0510  ? ?Lipase  ?   ?Component Value  Date/Time  ? LIPASE 42 12/29/2021 2051  ? ? ?Studies/Results: ?DG Chest 2 View ? ?Result Date: 12/29/2021 ?CLINICAL DATA:  Chest pain EXAM: CHEST - 2 VIEW COMPARISON:  10/31/2021 FINDINGS: The heart size and mediastinal contours are within normal limits. No focal airspace consolidation, pleural effusion, or pneumothorax. The visualized skeletal structures are unremarkable. Interval placement of thoracic spinal stimulator with leads in the mid to lower thoracic spine. IMPRESSION: No active cardiopulmonary disease. Electronically Signed   By: Davina Poke D.O.   On: 12/29/2021 20:46  ? ?DG Abd 1 View ? ?Result Date: 12/31/2021 ?CLINICAL DATA:  Small bowel obstruction. Patient denies abdominal pain. Diarrhea this morning. EXAM: ABDOMEN - 1 VIEW COMPARISON:  Abdominal x-rays from yesterday. FINDINGS: Oral contrast throughout the colon. No residual dilated small bowel loops. No acute osseous abnormality. IMPRESSION: 1. Resolved partial small bowel obstruction. Electronically Signed   By: Titus Dubin M.D.   On: 12/31/2021 09:08  ? ?CT ABDOMEN PELVIS W CONTRAST ? ?Result Date: 12/30/2021 ?CLINICAL DATA:  Acute abdominal pain. EXAM: CT ABDOMEN AND PELVIS WITH CONTRAST TECHNIQUE: Multidetector CT imaging of the abdomen and pelvis was performed using the standard protocol following bolus administration of intravenous contrast. RADIATION DOSE REDUCTION: This exam was performed according to the  departmental dose-optimization program which includes automated exposure control, adjustment of the mA and/or kV according to patient size and/or use of iterative reconstruction technique. CONTRAST:  75mL OMNIPAQUE IOHEXOL 300 MG/ML  SOLN COMPARISON:  CT angiogram chest abdomen and pelvis 08/11/2020. FINDINGS: Lower chest: There is atelectasis in the lung bases. Hepatobiliary: No focal liver abnormality is seen. Status post cholecystectomy. No biliary dilatation. Pancreas: Unremarkable. No pancreatic ductal dilatation or surrounding  inflammatory changes. Spleen: Normal in size without focal abnormality. There are calcified granulomas in the spleen. Adrenals/Urinary Tract: 2.7 cm cyst again seen in the right kidney. There is no hydronephrosis or perinephric stranding. Adrenal glands and bladder are within normal limits. Stomach/Bowel: Stomach is dilated with air-fluid level. There are multiple dilated small bowel loops with air-fluid levels measuring up to 3.9 cm in diameter. Definitive transition point is not located, but ileal loops are decompressed in the right abdomen. There is mesenteric edema associated with dilated small bowel. No pneumatosis or free air. Colon is nondilated. Appendix is within normal limits. There is colonic diverticulosis without evidence for acute diverticulitis. Vascular/Lymphatic: Aortic atherosclerosis. No enlarged abdominal or pelvic lymph nodes. Reproductive: Prostate gland is prominent in size. Penile pump present. Other: Trace free fluid in the right upper quadrant. Small fat containing bilateral inguinal hernias. Musculoskeletal: L4-5 posterior fusion hardware is present. IMPRESSION: 1. Mid small bowel obstruction. Definitive transition point not visualized. Mesenteric edema is present which can be seen with vascular compromise. 2. Trace free fluid in the right upper quadrant. 3. Colonic diverticulosis without evidence for acute diverticulitis. 4.  Aortic Atherosclerosis (ICD10-I70.0). Electronically Signed   By: Ronney Asters M.D.   On: 12/30/2021 01:06  ? ?DG Abd Portable 1V-Small Bowel Obstruction Protocol-initial, 8 hr delay ? ?Result Date: 12/30/2021 ?CLINICAL DATA:  Small bowel obstruction EXAM: PORTABLE ABDOMEN - 1 VIEW COMPARISON:  CT today. FINDINGS: Oral contrast material seen within stomach and dilated mid abdominal small bowel loops. Relative paucity of gas throughout the small bowel. There is likely contrast also within decompressed large bowel. No free air organomegaly. IMPRESSION: Fluid/contrast  filled dilated small bowel loops compatible with small bowel obstruction. There does appear to be some contrast within the large bowel compatible with partial bowel obstruction. Electronically Signed   By: Rolm Baptise M.D.   On: 12/30/2021 21:32   ? ?Anti-infectives: ?Anti-infectives (From admission, onward)  ? ? None  ? ?  ? ? ? ?Assessment/Plan ?pSBO  ?- History of multiple inguinal hernia repairs, umbilical hernia repair, cholecystectomy  ?- Patient w/ ROBF, symptomatically improved, exam reassuring, x-ray with oral contrast throughout colon and no residual dilated small bowel loops on x-ray this morning.  Start FLD. Can advance if patient tolerates.   ?  ?FEN - FLD ?VTE - SCD's, ok for chemical DVT ppx from [facility] perspective ?ID - none ?Admit - [facility] service ?  ?Below per [facility]  ?Hypertension ?Hyperlipidemia ?CAD status post DES 12/2020 ?Chronic pain ?Diabetes ?Depression/anxiety ?  ? ? LOS: 1 day  ? ? ?Jillyn Ledger , PA-C ?[facility] ?12/31/2021, 9:51 AM ?Please see Amion for pager number during day hours 7:00am-4:30pm ? ?

## 2021-12-31 NOTE — Progress Notes (Addendum)
Inpatient Diabetes Program Recommendations ? ?AACE/ADA: New Consensus Statement on Inpatient Glycemic Control  ?Target Ranges:  Prepandial:   less than 140 mg/dL ?     Peak postprandial:   less than 180 mg/dL (1-2 hours) ?     Critically ill patients:  140 - 180 mg/dL  ? ? Latest Reference Range & Units 12/31/21 00:34 12/31/21 03:26 12/31/21 08:13  ?Glucose-Capillary 70 - 99 mg/dL 85 83 84  ? ? Latest Reference Range & Units 12/29/21 20:33 12/30/21 12:38 12/30/21 20:39  ?Glucose-Capillary 70 - 99 mg/dL 974 (H) 86 97  ? ?Review of Glycemic Control ? ?Diabetes history: DM2 ?Outpatient Diabetes medications: Tandem T-Slim insulin pump with Humalog, Jardiance 25 mg daily, Ozempic 2 mg Qweek ?Current orders for Inpatient glycemic control: Insulin Pump Q4H ? ?NOTE: Patient admitted with small bowel obstruction, has hx of DM2 and uses an insulin pump outpatient. Per chart review, patient sees Dr. Everardo All (Endocrinologist) and was last seen 11/21/21 and per office note the following were documented as pump settings:  ?basal rate of 0.05 units/hr (when not in Control-IQ).  ?bolus of 1 unit/5 grams carbohydrate.   ?correction bolus (which some people call "sensitivity," or "insulin sensitivity ratio," or just "isr") of 1 unit for each 100 by which your glucose exceeds 120.  ? ?Addendum 12/31/21@12 :29-Spoke with patient at bedside regarding DM control. Patient states that he is using a T-Slim insulin pump with Humalog (with Dexcom G6 CGM), Jardiance 25 mg daily and Ozempic 2 mg Qweek on Wednesday) for DM control. Patient currently has on insulin pump for inpatient glycemic control. Patient confirms that he started on insulin pump in March and last seen Dr. Everardo All on 11/21/21. Patient pulled up pump settings which are as noted above in Dr. George Hugh note. Patient states that he is very diligent about his DM management and glucose usually trends well unless he is sick. Patient's current CGM glucose was 78 mg/dl. Patient states that  he has not eaten yet but his lunch tray with clear liquid diet arrived during conversation. Patient has extra pump supplies at bedside and reports he will change everything out tomorrow.  ? ?Thanks, ?Orlando Penner, RN, MSN, CDE ?Diabetes Coordinator ?Inpatient Diabetes Program ?[phone] (Team Pager from 8am to 5pm) ? ? ?

## 2021-12-31 NOTE — Progress Notes (Signed)
PROGRESS NOTE    Brandon [name]  KVQ:[id number] DOB: [birth year] DOA: 12/29/2021 PCP: Everrett Coombe, DO   Chief Complaint  Patient presents with   Abdominal Pain    Brief Narrative:    Brandon [name] is a 66 y.o. male with medical history significant of anxiety/depression; CAD s/p stents; DM; HTN; OSA on CPAP; and HLD presenting with abdominal pain. He reports that he went to his PCP Thursday after a bout with "gastroenteritis" (he now appreciates that the symptoms were similar to today's presentation).  Saturday about noon he developed abdominal pain and significant flatulence, nausea, inability to tolerate PO.  Last night, he had enough and he went to the ER.  He has had a prior SBO about 2 years ago, similar presentation.  Then he had lots of vomiting and diarrhea and got dehydrated.  He had an NG tube but no surgery.  He had a h/o cholecystectomy (gangrenous and adherent) and triple hernia repair.  He is developing some pain now.  He is having mild nausea, no vomiting.       ER Course:  [facility] to [facility] transfer, per Dr. Leafy Half:   RUQ pain and concurrent chest discomfort after lunch on 5/5.  This pain was severe in intensity and persisted for over 24 hours before the patient presented.CT revealed small bowel obstruction without a clear definitive transition point and evidence of mesenteric edema.  In the emergency department patient was relatively pain-free without vomiting and therefore NG tube was not placed.  Patient has been kept NPO.  Secure chat message sent to general surgery requesting formal consultation.    Assessment & Plan:   Principal Problem:   Small bowel obstruction (HCC) Active Problems:   Primary hypertension   Coronary artery disease   Type 2 diabetes mellitus with other specified complication (HCC)   Spinal cord stimulator status   Status post coronary artery stent placement   Mixed hyperlipidemia   OSA (obstructive sleep apnea)   Chronic pain  syndrome    Small bowel obstruction -Patient with prior h/o abdominal surgeries and prior SBO presenting with acute onset of abdominal pain with nausea, abdominal distention, and CT findings c/w SBO -Continue with medical management, appears to be improving, advance to clear liquid diet, advance as tolerated -Continue with IV fluids. -Continue with as needed medications -Management per general surgery    CAD -s/p stents in 2021 and 2022 -Denies current CP -Continue with aspirin, will hold Plavix today, if patient continues to improve we will resume back on Plavix tomorrow, most recent stent was 01/17/2021.  . -Continue Imdur   Chronic pain -I have reviewed this patient in the [place] Controlled Substances Reporting System.  He is receiving medications from only one provider and appears to be taking them as prescribed. -He is at increased high risk of opioid misuse, diversion, or overdose.  -Continue buprenorphine -Also has spinal cord stimulator -Pain control with oxy, morphine to start; he may require increased dose of opioids given his chronic pain as well as possible antagonism by buprenorphine   DM -He is on an insulin pump at home, will continue -Hold Jardiance   HTN -Continue Toprol XL   Anxiety/depression -Continue home meds - Abilify, Wellbutrin   HLD -Continue Lipitor   OSA -Continue CPAP      DVT prophylaxis: Lovenox Code Status: Full Family Communication: None at bedside Disposition:   Status is: Inpatient    Consultants:  General surgery   Subjective:  Reports good bowel movement  this a.m., nausea has improved, denies vomiting, still complaining of abdominal pain  Objective: Vitals:   12/31/21 0026 12/31/21 0322 12/31/21 0808 12/31/21 1244  BP:  107/60 (!) 108/58 (!) 119/51  Pulse: 77 76 71 76  Resp: 17 17 18 17   Temp:  98.1 F (36.7 C) 97.8 F (36.6 C) (!) 97.5 F (36.4 C)  TempSrc:  Oral Oral Oral  SpO2: 91% 93% 92% 95%  Weight:       Height:        Intake/Output Summary (Last 24 hours) at 12/31/2021 1301 Last data filed at 12/30/2021 1542 Gross per 24 hour  Intake 895.63 ml  Output --  Net 895.63 ml   Filed Weights   12/29/21 2025  Weight: 83.9 kg    Examination:  Awake Alert, Oriented X 3, No new F.N deficits, Normal affect Symmetrical Chest wall movement, Good air movement bilaterally, CTAB RRR,No Gallops,Rubs or new Murmurs, No Parasternal Heave +ve B.Sounds, Abd Soft, mild diffuse  tenderness, No rebound - guarding or rigidity. No Cyanosis, Clubbing or edema, No new Rash or bruise   .     Data Reviewed: I have personally reviewed following labs and imaging studies  CBC: Recent Labs  Lab 12/29/21 2051 12/30/21 0604  WBC 10.2 8.5  NEUTROABS 7.9* 6.3  HGB 16.8 16.5  HCT 49.5 48.3  MCV 89.8 89.6  PLT 233 212    Basic Metabolic Panel: Recent Labs  Lab 12/29/21 2051 12/30/21 0604  NA 139 143  K 3.4* 3.5  CL 106 104  CO2 25 28  GLUCOSE 118* 103*  BUN 7* 6*  CREATININE 0.67 0.75  CALCIUM 9.3 9.5  MG  --  1.8    GFR: Estimated Creatinine Clearance: 90.8 mL/min (by C-G formula based on SCr of 0.75 mg/dL).  Liver Function Tests: Recent Labs  Lab 12/29/21 2051 12/30/21 0604  AST 22 23  ALT 15 14  ALKPHOS 79 69  BILITOT 0.7 1.2  PROT 7.7 7.0  ALBUMIN 4.1 3.7    CBG: Recent Labs  Lab 12/30/21 2039 12/31/21 0034 12/31/21 0326 12/31/21 0813 12/31/21 1247  GLUCAP 97 85 83 84 81     No results found for this or any previous visit (from the past 240 hour(s)).       Radiology Studies: DG Chest 2 View  Result Date: 12/29/2021 CLINICAL DATA:  Chest pain EXAM: CHEST - 2 VIEW COMPARISON:  10/31/2021 FINDINGS: The heart size and mediastinal contours are within normal limits. No focal airspace consolidation, pleural effusion, or pneumothorax. The visualized skeletal structures are unremarkable. Interval placement of thoracic spinal stimulator with leads in the mid to lower  thoracic spine. IMPRESSION: No active cardiopulmonary disease. Electronically Signed   By: Duanne Guess D.O.   On: 12/29/2021 20:46   DG Abd 1 View  Result Date: 12/31/2021 CLINICAL DATA:  Small bowel obstruction. Patient denies abdominal pain. Diarrhea this morning. EXAM: ABDOMEN - 1 VIEW COMPARISON:  Abdominal x-rays from yesterday. FINDINGS: Oral contrast throughout the colon. No residual dilated small bowel loops. No acute osseous abnormality. IMPRESSION: 1. Resolved partial small bowel obstruction. Electronically Signed   By: Obie Dredge M.D.   On: 12/31/2021 09:08   CT ABDOMEN PELVIS W CONTRAST  Result Date: 12/30/2021 CLINICAL DATA:  Acute abdominal pain. EXAM: CT ABDOMEN AND PELVIS WITH CONTRAST TECHNIQUE: Multidetector CT imaging of the abdomen and pelvis was performed using the standard protocol following bolus administration of intravenous contrast. RADIATION DOSE REDUCTION: This exam was performed  according to the departmental dose-optimization program which includes automated exposure control, adjustment of the mA and/or kV according to patient size and/or use of iterative reconstruction technique. CONTRAST:  80mL OMNIPAQUE IOHEXOL 300 MG/ML  SOLN COMPARISON:  CT angiogram chest abdomen and pelvis 08/11/2020. FINDINGS: Lower chest: There is atelectasis in the lung bases. Hepatobiliary: No focal liver abnormality is seen. Status post cholecystectomy. No biliary dilatation. Pancreas: Unremarkable. No pancreatic ductal dilatation or surrounding inflammatory changes. Spleen: Normal in size without focal abnormality. There are calcified granulomas in the spleen. Adrenals/Urinary Tract: 2.7 cm cyst again seen in the right kidney. There is no hydronephrosis or perinephric stranding. Adrenal glands and bladder are within normal limits. Stomach/Bowel: Stomach is dilated with air-fluid level. There are multiple dilated small bowel loops with air-fluid levels measuring up to 3.9 cm in diameter.  Definitive transition point is not located, but ileal loops are decompressed in the right abdomen. There is mesenteric edema associated with dilated small bowel. No pneumatosis or free air. Colon is nondilated. Appendix is within normal limits. There is colonic diverticulosis without evidence for acute diverticulitis. Vascular/Lymphatic: Aortic atherosclerosis. No enlarged abdominal or pelvic lymph nodes. Reproductive: Prostate gland is prominent in size. Penile pump present. Other: Trace free fluid in the right upper quadrant. Small fat containing bilateral inguinal hernias. Musculoskeletal: L4-5 posterior fusion hardware is present. IMPRESSION: 1. Mid small bowel obstruction. Definitive transition point not visualized. Mesenteric edema is present which can be seen with vascular compromise. 2. Trace free fluid in the right upper quadrant. 3. Colonic diverticulosis without evidence for acute diverticulitis. 4.  Aortic Atherosclerosis (ICD10-I70.0). Electronically Signed   By: Darliss Cheney M.D.   On: 12/30/2021 01:06   DG Abd Portable 1V-Small Bowel Obstruction Protocol-initial, 8 hr delay  Result Date: 12/30/2021 CLINICAL DATA:  Small bowel obstruction EXAM: PORTABLE ABDOMEN - 1 VIEW COMPARISON:  CT today. FINDINGS: Oral contrast material seen within stomach and dilated mid abdominal small bowel loops. Relative paucity of gas throughout the small bowel. There is likely contrast also within decompressed large bowel. No free air organomegaly. IMPRESSION: Fluid/contrast filled dilated small bowel loops compatible with small bowel obstruction. There does appear to be some contrast within the large bowel compatible with partial bowel obstruction. Electronically Signed   By: Charlett Nose M.D.   On: 12/30/2021 21:32        Scheduled Meds:  ARIPiprazole  10 mg Oral q morning   aspirin EC  81 mg Oral q morning   atorvastatin  80 mg Oral Daily   [START ON 01/04/2022] buprenorphine  1 patch Transdermal Weekly    buPROPion  300 mg Oral Daily   enoxaparin (LOVENOX) injection  40 mg Subcutaneous Q24H   insulin pump   Subcutaneous Q4H   isosorbide mononitrate  30 mg Oral Daily   metoprolol succinate  25 mg Oral Daily   pantoprazole (PROTONIX) IV  40 mg Intravenous Q24H   tamsulosin  0.4 mg Oral QPC lunch   Continuous Infusions:  lactated ringers 100 mL/hr at 12/31/21 0044     LOS: 1 day        Huey Bienenstock, MD [facility]   To contact the attending provider between 7A-7P or the covering provider during after hours 7P-7A, please log into the web site [email or web address] and access using universal [place] password for that web site. If you do not have the password, please call the hospital operator.  12/31/2021, 1:01 PM

## 2021-12-31 NOTE — Progress Notes (Signed)
?  Transition of Care (TOC) Screening Note ? ? ?Patient Details  ?Name: Brandon [name] ?Date of Birth: 01-Nov-1955 ? ? ?Transition of Care (TOC) CM/SW Contact:    ?Mearl Latin, LCSW ?Phone Number: ?12/31/2021, 9:49 AM ? ? ? ?Transition of Care Department [facility]) has reviewed patient and no TOC needs have been identified at this time. We will continue to monitor patient advancement through interdisciplinary progression rounds. If new patient transition needs arise, please place a TOC consult. ? ? ?

## 2021-12-31 NOTE — Progress Notes (Signed)
Patient refused CPAP. Aware to let staff know if wanted. ?

## 2022-01-01 ENCOUNTER — Ambulatory Visit: Payer: No Typology Code available for payment source | Admitting: Physical Therapy

## 2022-01-01 DIAGNOSIS — Z955 Presence of coronary angioplasty implant and graft: Secondary | ICD-10-CM | POA: Diagnosis not present

## 2022-01-01 DIAGNOSIS — K56609 Unspecified intestinal obstruction, unspecified as to partial versus complete obstruction: Secondary | ICD-10-CM | POA: Diagnosis not present

## 2022-01-01 LAB — CBC
HCT: 40.2 % (ref 39.0–52.0)
Hemoglobin: 13.9 g/dL (ref 13.0–17.0)
MCH: 31 pg (ref 26.0–34.0)
MCHC: 34.6 g/dL (ref 30.0–36.0)
MCV: 89.7 fL (ref 80.0–100.0)
Platelets: 166 10*3/uL (ref 150–400)
RBC: 4.48 MIL/uL (ref 4.22–5.81)
RDW: 14.6 % (ref 11.5–15.5)
WBC: 4.1 10*3/uL (ref 4.0–10.5)
nRBC: 0 % (ref 0.0–0.2)

## 2022-01-01 LAB — GLUCOSE, CAPILLARY
Glucose-Capillary: 100 mg/dL — ABNORMAL HIGH (ref 70–99)
Glucose-Capillary: 131 mg/dL — ABNORMAL HIGH (ref 70–99)
Glucose-Capillary: 142 mg/dL — ABNORMAL HIGH (ref 70–99)
Glucose-Capillary: 150 mg/dL — ABNORMAL HIGH (ref 70–99)
Glucose-Capillary: 87 mg/dL (ref 70–99)
Glucose-Capillary: 93 mg/dL (ref 70–99)
Glucose-Capillary: 98 mg/dL (ref 70–99)

## 2022-01-01 LAB — BASIC METABOLIC PANEL
Anion gap: 7 (ref 5–15)
BUN: 6 mg/dL — ABNORMAL LOW (ref 8–23)
CO2: 27 mmol/L (ref 22–32)
Calcium: 8.4 mg/dL — ABNORMAL LOW (ref 8.9–10.3)
Chloride: 106 mmol/L (ref 98–111)
Creatinine, Ser: 0.75 mg/dL (ref 0.61–1.24)
GFR, Estimated: >60 mL/min (ref >60)
Glucose, Bld: 83 mg/dL (ref 70–99)
Potassium: 3.2 mmol/L — ABNORMAL LOW (ref 3.5–5.1)
Sodium: 140 mmol/L (ref 135–145)

## 2022-01-01 LAB — MAGNESIUM: Magnesium: 1.7 mg/dL (ref 1.7–2.4)

## 2022-01-01 LAB — PHOSPHORUS: Phosphorus: 4.1 mg/dL (ref 2.5–4.6)

## 2022-01-01 MED ORDER — POTASSIUM CHLORIDE 20 MEQ PO PACK
40.0000 meq | PACK | Freq: Once | ORAL | Status: AC
  Administered 2022-01-01: 40 meq via ORAL
  Filled 2022-01-01: qty 2

## 2022-01-01 MED ORDER — POTASSIUM CHLORIDE 10 MEQ/100ML IV SOLN
10.0000 meq | INTRAVENOUS | Status: DC
  Administered 2022-01-01 (×2): 10 meq via INTRAVENOUS
  Filled 2022-01-01: qty 100

## 2022-01-01 MED ORDER — POTASSIUM CHLORIDE 10 MEQ/100ML IV SOLN
10.0000 meq | INTRAVENOUS | Status: AC
  Administered 2022-01-01 (×2): 10 meq via INTRAVENOUS

## 2022-01-01 MED ORDER — MAGNESIUM SULFATE 2 GM/50ML IV SOLN
2.0000 g | Freq: Once | INTRAVENOUS | Status: AC
  Administered 2022-01-01: 2 g via INTRAVENOUS
  Filled 2022-01-01: qty 50

## 2022-01-01 MED ORDER — CLOPIDOGREL BISULFATE 75 MG PO TABS
75.0000 mg | ORAL_TABLET | Freq: Every day | ORAL | Status: DC
  Administered 2022-01-01 – 2022-01-02 (×2): 75 mg via ORAL
  Filled 2022-01-01 (×2): qty 1

## 2022-01-01 MED ORDER — PANTOPRAZOLE SODIUM 40 MG PO TBEC
40.0000 mg | DELAYED_RELEASE_TABLET | Freq: Every day | ORAL | Status: DC
  Administered 2022-01-02: 40 mg via ORAL
  Filled 2022-01-01: qty 1

## 2022-01-01 NOTE — Progress Notes (Signed)
? ? ?   ?Subjective: ?CC: ?Patient reports that last night he had episodes of crampy abdominal pain followed by large episodes of diarrhea. ~4 episodes in total. Pain improved after bm's but still required pain med. Few episodes of burping/belching yesterday it sounds like but no n/v. Pain is better this am with some mild pain on b/l sides of his abdomen and some bloating. Tolerating FLD and took in a decent amount for breakfast without worsening abdominal pain, n/v.  ? ?Objective: ?Vital signs in last 24 hours: ?Temp:  [97.5 ?F (36.4 ?C)-98.7 ?F (37.1 ?C)] 98.6 ?F (37 ?C) (05/09 [id number]) ?Pulse Rate:  [62-76] 75 (05/09 0803) ?Resp:  [17-18] 18 (05/09 0803) ?BP: (119-143)/(51-74) 143/74 (05/09 0803) ?SpO2:  [92 %-95 %] 94 % (05/09 0803) ?Last BM Date : 12/31/21 ? ?Intake/Output from previous day: ?05/08 0701 - 05/09 0700 ?In: -  ?Out: 4 [Stool:4] ?Intake/Output this shift: ?No intake/output data recorded. ? ?PE: ?Gen:  Alert, NAD, pleasant ?Abd: Soft, mild distension, mild tenderness on the sides (R+L) without tenderness centrally. No peritonitis, +BS ?Psych: A&Ox3  ? ?Lab Results:  ?Recent Labs  ?  12/30/21 ?0604 01/01/22 ?0408  ?WBC 8.5 4.1  ?HGB 16.5 13.9  ?HCT 48.3 40.2  ?PLT 212 166  ? ?BMET ?Recent Labs  ?  12/30/21 ?0604 01/01/22 ?0408  ?NA 143 140  ?K 3.5 3.2*  ?CL 104 106  ?CO2 28 27  ?GLUCOSE 103* 83  ?BUN 6* 6*  ?CREATININE 0.75 0.75  ?CALCIUM 9.5 8.4*  ? ?PT/INR ?No results for input(s): LABPROT, INR in the last 72 hours. ?CMP  ?   ?Component Value Date/Time  ? NA 140 01/01/2022 0408  ? NA 140 01/02/2021 1014  ? K 3.2 (L) 01/01/2022 0408  ? CL 106 01/01/2022 0408  ? CO2 27 01/01/2022 0408  ? GLUCOSE 83 01/01/2022 0408  ? BUN 6 (L) 01/01/2022 0408  ? BUN 12 01/02/2021 1014  ? CREATININE 0.75 01/01/2022 0408  ? CREATININE 0.73 11/22/2021 0000  ? CALCIUM 8.4 (L) 01/01/2022 0408  ? PROT 7.0 12/30/2021 0604  ? PROT 7.0 10/23/2020 0824  ? ALBUMIN 3.7 12/30/2021 0604  ? ALBUMIN 4.5 10/23/2020 0824  ? AST 23  12/30/2021 0604  ? ALT 14 12/30/2021 0604  ? ALKPHOS 69 12/30/2021 0604  ? BILITOT 1.2 12/30/2021 0604  ? BILITOT 0.4 10/23/2020 0824  ? GFRNONAA >60 01/01/2022 0408  ? GFRAA >60 04/17/2020 0510  ? ?Lipase  ?   ?Component Value Date/Time  ? LIPASE 42 12/29/2021 2051  ? ? ?Studies/Results: ?DG Abd 1 View ? ?Result Date: 12/31/2021 ?CLINICAL DATA:  Small bowel obstruction. Patient denies abdominal pain. Diarrhea this morning. EXAM: ABDOMEN - 1 VIEW COMPARISON:  Abdominal x-rays from yesterday. FINDINGS: Oral contrast throughout the colon. No residual dilated small bowel loops. No acute osseous abnormality. IMPRESSION: 1. Resolved partial small bowel obstruction. Electronically Signed   By: Titus Dubin M.D.   On: 12/31/2021 09:08  ? ?DG Abd Portable 1V-Small Bowel Obstruction Protocol-initial, 8 hr delay ? ?Result Date: 12/30/2021 ?CLINICAL DATA:  Small bowel obstruction EXAM: PORTABLE ABDOMEN - 1 VIEW COMPARISON:  CT today. FINDINGS: Oral contrast material seen within stomach and dilated mid abdominal small bowel loops. Relative paucity of gas throughout the small bowel. There is likely contrast also within decompressed large bowel. No free air organomegaly. IMPRESSION: Fluid/contrast filled dilated small bowel loops compatible with small bowel obstruction. There does appear to be some contrast within the large bowel compatible with partial  bowel obstruction. Electronically Signed   By: Rolm Baptise M.D.   On: 12/30/2021 21:32   ? ?Anti-infectives: ?Anti-infectives (From admission, onward)  ? ? None  ? ?  ? ? ? ?Assessment/Plan ?pSBO  ?- History of multiple inguinal hernia repairs, umbilical hernia repair, cholecystectomy  ?- X-ray with oral contrast throughout colon and no residual dilated small bowel loops on x-ray 5/8.   ?- Had some crampy abdominal pain yesterday, burping/bleching. Feeling better this am. He has ROBF, tolerating FLD without n/v and exam reassuring. Will advance to soft diet. States he is nervous  to go home today and has feels like he has been sent home to early for resolving sbo's in the past. Discussed w/ [facility]. They plan to keep overnight for monitoring. Hopefully he will continue to improve and can be d/c'd in the AM.   ?  ?FEN - Soft diet ?VTE - SCD's, ok for chemical DVT ppx from [facility] perspective ?ID - none ?Admit - [facility] service ?  ?Below per [facility]  ?Hypertension ?Hyperlipidemia ?CAD status post DES 12/2020 ?Chronic pain ?Diabetes ?Depression/anxiety ? ? LOS: 2 days  ? ? ?Jillyn Ledger , PA-C ?[facility] ?01/01/2022, 8:48 AM ?Please see Amion for pager number during day hours 7:00am-4:30pm ? ?

## 2022-01-01 NOTE — Progress Notes (Signed)
?PROGRESS NOTE ? ? ? ?Brandon Hutchingerry T Freestone  ZOX:[id number] DOB: 03-May-1956 DOA: 12/29/2021 ?PCP: Everrett CoombeMatthews, [place], DO  ? ?Chief Complaint  ?Patient presents with  ? Abdominal Pain  ? ? ?Brief Narrative:  ? ? ?Brandon [name] is a 66 y.o. male with medical history significant of anxiety/depression; CAD s/p stents; DM; HTN; OSA on CPAP; and HLD presenting with abdominal pain.  Work-up significant for partial small bowel obstruction, he was admitted for further work-up.  . ? ? ? ?Assessment & Plan: ?  ?Principal Problem: ?  Small bowel obstruction (HCC) ?Active Problems: ?  Primary hypertension ?  Coronary artery disease ?  Type 2 diabetes mellitus with other specified complication (HCC) ?  Spinal cord stimulator status ?  Status post coronary artery stent placement ?  Mixed hyperlipidemia ?  OSA (obstructive sleep apnea) ?  Chronic pain syndrome ? ? ? ?Small bowel obstruction ?-Patient with prior h/o abdominal surgeries and prior SBO presenting with acute onset of abdominal pain with nausea, abdominal distention, and CT findings c/w SBO ?-Continue with medical management, patient continues to improve, advance to soft diet, multiple BM overnight -continue with IV fluids . ?-Continues to improve likely can be discharged in a.m. . ?-General surgery input greatly appreciated ? ?  ?CAD ?-s/p stents in 2021 and 2022 ?-Denies current CP ?-Continue with aspirin, plavix was held initially , most recent stent was 01/17/2021.  Patient is improving, and no surgery is anticipated will resume back on Plavix. ?-Continue Imdur ?  ?Chronic pain ?-I have reviewed this patient in the [place] Controlled Substances Reporting System.  He is receiving medications from only one provider and appears to be taking them as prescribed. ?-He is at increased high risk of opioid misuse, diversion, or overdose.  ?-Continue buprenorphine ?-Also has spinal cord stimulator ?-Pain control with oxy, morphine to start; he may require increased dose of opioids given his  chronic pain as well as possible antagonism by buprenorphine ?  ?DM ?-He is on an insulin pump at home, will continue ?-Hold Jardiance ?  ?HTN ?-Continue Toprol XL ?  ?Anxiety/depression ?-Continue home meds - Abilify, Wellbutrin ?  ?HLD ?-Continue Lipitor ?  ?OSA ?-Continue CPAP ?  ?  ? ?DVT prophylaxis: Lovenox ?Code Status: Full ?Family Communication: Discussed with wife by phone ?Disposition:  ? ?Status is: Inpatient ? ?  ?Consultants:  ?General surgery ? ? ?Subjective: ? ?Multiple BMs overnight, loose, no nausea, no vomiting, tolerating oral intake.   ? ? ?Objective: ?Vitals:  ? 01/01/22 0000 01/01/22 0400 01/01/22 0803 01/01/22 1207  ?BP: (!) 141/69 140/67 (!) 143/74 125/66  ?Pulse: 66 64 75 76  ?Resp: 18 18 18 18   ?Temp: 97.9 ?F (36.6 ?C) 98.7 ?F (37.1 ?C) 98.6 ?F (37 ?C) 97.7 ?F (36.5 ?C)  ?TempSrc: Oral Oral Oral Oral  ?SpO2: 93%  94% 92%  ?Weight:      ?Height:      ? ? ?Intake/Output Summary (Last 24 hours) at 01/01/2022 1527 ?Last data filed at 01/01/2022 0000 ?Gross per 24 hour  ?Intake --  ?Output 4 ml  ?Net -4 ml  ? ?Filed Weights  ? 12/29/21 2025  ?Weight: 83.9 kg  ? ? ?Examination: ? ?Awake Alert, Oriented X 3, No new F.N deficits, Normal affect ?Symmetrical Chest wall movement, Good air movement bilaterally, CTAB ?RRR,No Gallops,Rubs or new Murmurs, No Parasternal Heave ?+ve B.Sounds, Abd Soft, No tenderness, No rebound - guarding or rigidity. ?No Cyanosis, Clubbing or edema, No new Rash or bruise   ? ?.  ? ? ? ?  Data Reviewed: I have personally reviewed following labs and imaging studies ? ?CBC: ?Recent Labs  ?Lab 12/29/21 ?2051 12/30/21 ?0604 01/01/22 ?0408  ?WBC 10.2 8.5 4.1  ?NEUTROABS 7.9* 6.3  --   ?HGB 16.8 16.5 13.9  ?HCT 49.5 48.3 40.2  ?MCV 89.8 89.6 89.7  ?PLT 233 212 166  ? ? ?Basic Metabolic Panel: ?Recent Labs  ?Lab 12/29/21 ?2051 12/30/21 ?0604 01/01/22 ?0408  ?NA 139 143 140  ?K 3.4* 3.5 3.2*  ?CL 106 104 106  ?CO2 25 28 27   ?GLUCOSE 118* 103* 83  ?BUN 7* 6* 6*  ?CREATININE 0.67 0.75  0.75  ?CALCIUM 9.3 9.5 8.4*  ?MG  --  1.8 1.7  ?PHOS  --   --  4.1  ? ? ?GFR: ?Estimated Creatinine Clearance: 90.8 mL/min (by C-G formula based on SCr of 0.75 mg/dL). ? ?Liver Function Tests: ?Recent Labs  ?Lab 12/29/21 ?2051 12/30/21 ?0604  ?AST 22 23  ?ALT 15 14  ?ALKPHOS 79 69  ?BILITOT 0.7 1.2  ?PROT 7.7 7.0  ?ALBUMIN 4.1 3.7  ? ? ?CBG: ?Recent Labs  ?Lab 12/31/21 ?2046 01/01/22 ?03/03/22 01/01/22 ?0402 01/01/22 ?03/03/22 01/01/22 ?1212  ?GLUCAP 117* 98 87 93 131*  ? ? ? ?No results found for this or any previous visit (from the past 240 hour(s)).  ? ? ? ? ? ?Radiology Studies: ?DG Abd 1 View ? ?Result Date: 12/31/2021 ?CLINICAL DATA:  Small bowel obstruction. Patient denies abdominal pain. Diarrhea this morning. EXAM: ABDOMEN - 1 VIEW COMPARISON:  Abdominal x-rays from yesterday. FINDINGS: Oral contrast throughout the colon. No residual dilated small bowel loops. No acute osseous abnormality. IMPRESSION: 1. Resolved partial small bowel obstruction. Electronically Signed   By: 03/02/2022 M.D.   On: 12/31/2021 09:08  ? ?DG Abd Portable 1V-Small Bowel Obstruction Protocol-initial, 8 hr delay ? ?Result Date: 12/30/2021 ?CLINICAL DATA:  Small bowel obstruction EXAM: PORTABLE ABDOMEN - 1 VIEW COMPARISON:  CT today. FINDINGS: Oral contrast material seen within stomach and dilated mid abdominal small bowel loops. Relative paucity of gas throughout the small bowel. There is likely contrast also within decompressed large bowel. No free air organomegaly. IMPRESSION: Fluid/contrast filled dilated small bowel loops compatible with small bowel obstruction. There does appear to be some contrast within the large bowel compatible with partial bowel obstruction. Electronically Signed   By: 03/01/2022 M.D.   On: 12/30/2021 21:32   ? ? ? ? ? ?Scheduled Meds: ? ARIPiprazole  10 mg Oral q morning  ? aspirin EC  81 mg Oral q morning  ? atorvastatin  80 mg Oral Daily  ? [START ON 01/04/2022] buprenorphine  1 patch Transdermal Weekly  ?  buPROPion  300 mg Oral Daily  ? enoxaparin (LOVENOX) injection  40 mg Subcutaneous Q24H  ? insulin pump   Subcutaneous Q4H  ? isosorbide mononitrate  30 mg Oral Daily  ? metoprolol succinate  25 mg Oral Daily  ? [START ON 01/02/2022] pantoprazole  40 mg Oral Daily  ? tamsulosin  0.4 mg Oral QPC lunch  ? ?Continuous Infusions: ? lactated ringers 100 mL/hr at 01/01/22 0654  ? ? ? LOS: 2 days  ? ? ? ? ? ? ?03/03/22, MD ?[facility] ? ? ?To contact the attending provider between 7A-7P or the covering provider during after hours 7P-7A, please log into the web site [email or web address] and access using universal [place] password for that web site. If you do not have the password, please call the hospital operator. ? ?  01/01/2022, 3:27 PM  ?  ?

## 2022-01-01 NOTE — Progress Notes (Addendum)
Patient refused CPAP for HS and states he does not wish to use hospital CPAP. Equipment removed from room.  ?

## 2022-01-02 ENCOUNTER — Other Ambulatory Visit: Payer: Self-pay | Admitting: Endocrinology

## 2022-01-02 ENCOUNTER — Other Ambulatory Visit (HOSPITAL_COMMUNITY): Payer: Self-pay

## 2022-01-02 DIAGNOSIS — G4733 Obstructive sleep apnea (adult) (pediatric): Secondary | ICD-10-CM

## 2022-01-02 DIAGNOSIS — E782 Mixed hyperlipidemia: Secondary | ICD-10-CM

## 2022-01-02 LAB — BASIC METABOLIC PANEL
Anion gap: 5 (ref 5–15)
BUN: <5 mg/dL — ABNORMAL LOW (ref 8–23)
CO2: 26 mmol/L (ref 22–32)
Calcium: 8.7 mg/dL — ABNORMAL LOW (ref 8.9–10.3)
Chloride: 108 mmol/L (ref 98–111)
Creatinine, Ser: 0.59 mg/dL — ABNORMAL LOW (ref 0.61–1.24)
GFR, Estimated: >60 mL/min (ref >60)
Glucose, Bld: 96 mg/dL (ref 70–99)
Potassium: 3.4 mmol/L — ABNORMAL LOW (ref 3.5–5.1)
Sodium: 139 mmol/L (ref 135–145)

## 2022-01-02 LAB — GLUCOSE, CAPILLARY
Glucose-Capillary: 100 mg/dL — ABNORMAL HIGH (ref 70–99)
Glucose-Capillary: 114 mg/dL — ABNORMAL HIGH (ref 70–99)
Glucose-Capillary: 161 mg/dL — ABNORMAL HIGH (ref 70–99)

## 2022-01-02 MED ORDER — DEXCOM G6 SENSOR MISC
1.0000 | 3 refills | Status: DC
  Filled 2022-01-02 – 2022-11-27 (×2): qty 9, 90d supply, fill #0

## 2022-01-02 MED ORDER — POTASSIUM CHLORIDE 20 MEQ PO PACK
40.0000 meq | PACK | ORAL | Status: AC
  Administered 2022-01-02 (×2): 40 meq via ORAL
  Filled 2022-01-02 (×2): qty 2

## 2022-01-02 NOTE — TOC Transition Note (Signed)
Transition of Care (TOC) - CM/SW Discharge Note ? ? ?Patient Details  ?Name: Brandon [name] ?MRN: [id number] ?Date of Birth: October 27, 1955 ? ?Transition of Care (TOC) CM/SW Contact:  ?Cyndi Bender, RN ?Phone Number: ?01/02/2022, 8:56 AM ? ? ?Clinical Narrative:    ?Patient stable for discharge. Wife will transport home.  ? ?Final next level of care: Home/Self Care ?Barriers to Discharge: Barriers Resolved ? ? ?Patient Goals and CMS Choice ?Patient states their goals for this hospitalization and ongoing recovery are:: return ?  ?  ? ?Discharge Placement ?  ?           ?  ?  ?  ? home ? ?Discharge Plan and Services ?  ?  ?           ?  ?  ?  ? home ?  ?  ?  ?  ?  ?  ? ?Social Determinants of Health (SDOH) Interventions ?  ? ? ?Readmission Risk Interventions ? ?  01/02/2022  ?  8:55 AM  ?Readmission Risk Prevention Plan  ?Post Dischage Appt Complete  ?Medication Screening Complete  ?Transportation Screening Complete  ? ? ? ? ? ?

## 2022-01-02 NOTE — Progress Notes (Signed)
Patient left floor via wheelchair accompanied by staff no c/o pain or shortness of breath at d/c.  Indra Wolters Lynn, RN   

## 2022-01-02 NOTE — Plan of Care (Signed)
  Problem: Education: Goal: Knowledge of General Education information will improve Description: Including pain rating scale, medication(s)/side effects and non-pharmacologic comfort measures Outcome: Adequate for Discharge   Problem: Activity: Goal: Risk for activity intolerance will decrease Outcome: Adequate for Discharge   

## 2022-01-02 NOTE — Progress Notes (Signed)
Discharge instructions reviewed with pt.  ?Copy of instructions given to pt. No new scripts given.  ?Pt's wife on her way, pt states she has his clothes to put on to go home.  ?Pt's bedside nurse informed of this, when wife arrives with pt clothes he will be ready to go.  ?

## 2022-01-02 NOTE — Consult Note (Signed)
? ?  [facility] CM Inpatient Consult ? ? ?01/02/2022 ? ?[name] ?19-Jan-1956 ?[id number] ? ? 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 ?Primary Care Provider:  Everrett Coombe, DO, [facility] ? ? ?Patient transitioned home today. Admitted with Abdominal pain ? Rounding on unit and patient had transitioned home   ?Reviewed EMR for needs. ? [name]: Patient will be followed with TOC by from MD office.  No other needs assessed for complex disease management or care management ?  ?For additional questions or referrals please contact: ?  ?Brandon Shanks, RN BSN CCM ?[facility] [employer] Liaison ? [phone] business mobile phone ?Toll free office [phone]  ?Fax number: [phone] ?[email or web address] ?[email or web address]  ? ?

## 2022-01-02 NOTE — Discharge Summary (Addendum)
PATIENT DETAILS Name: Brandon [name] Age: 66 y.o. Sex: male Date of Birth: 01-Feb-1956 MRN: [id number]. Admitting Physician: Jonah Blue, MD FAO:[id number], Selena Batten, DO  Admit Date: 12/29/2021 Discharge date: 01/02/2022  Recommendations for Outpatient Follow-up:  Follow up with PCP in 1-2 weeks Please obtain CMP/CBC in one week  Admitted From:  Home  Disposition: Home   Discharge Condition: good  CODE STATUS:   Code Status: Full Code   Diet recommendation:  Diet Order             DIET SOFT Room service appropriate? Yes; Fluid consistency: Thin  Diet effective now           Diet - low sodium heart healthy           Diet Carb Modified                    Brief Summary: 66 year old with a history of DM, HTN, HLD, CAD s/p PCI-who presented with abdominal pain-found to have partial small bowel obstruction admitted to the hospitalist service.  Brief Hospital Course: Partial SBO: Managed with conservative measures-tolerating advancement in diet-no abdominal pain-having BMs.  Stable for discharge.  General surgery followed closely during this hospitalization.  Hypokalemia: Replete and recheck at PCPs office.  CAD : No chest pain-last PCI 01/17/2021.  Continue antiplatelets/statin/beta-blocker-follow with primary cardiology.  Insulin-dependent DM-2: CBG stable-continue insulin pump as previous.  Resume Jardiance and Ozempic.  HTN: BP stable-continue beta-blocker  HLD: Continue Lipitor  Anxiety/depression: Stable-continue Welbutrin/Abilify.  BPH: Continue Flomax  OSA: CPAP  Chronic pain syndrome: Continue usual outpatient narcotic regimen.  BMI: Estimated body mass index is 27.32 kg/m as calculated from the following:   Height as of this encounter: 5\' 9"  (1.753 m).   Weight as of this encounter: 83.9 kg.    Discharge Diagnoses:  Principal Problem:   Small bowel obstruction (HCC) Active Problems:   Primary hypertension   Coronary artery disease   Type  2 diabetes mellitus with other specified complication (HCC)   Spinal cord stimulator status   Status post coronary artery stent placement   Mixed hyperlipidemia   OSA (obstructive sleep apnea)   Chronic pain syndrome   Discharge Instructions:  Activity:  As tolerated   Discharge Instructions     Call MD for:  persistant nausea and vomiting   Complete by: As directed    Call MD for:  severe uncontrolled pain   Complete by: As directed    Diet - low sodium heart healthy   Complete by: As directed    Diet Carb Modified   Complete by: As directed    Discharge instructions   Complete by: As directed    Follow with Primary MD  Everrett Coombe, DO in 1-2 weeks  Please get a complete blood count and chemistry panel checked by your Primary MD at your next visit, and again as instructed by your Primary MD.  Get Medicines reviewed and adjusted: Please take all your medications with you for your next visit with your Primary MD  Laboratory/radiological data: Please request your Primary MD to go over all hospital tests and procedure/radiological results at the follow up, please ask your Primary MD to get all Hospital records sent to his/her office.  In some cases, they will be blood work, cultures and biopsy results pending at the time of your discharge. Please request that your primary care M.D. follows up on these results.  Also Note the following: If you experience worsening of your  admission symptoms, develop shortness of breath, life threatening emergency, suicidal or homicidal thoughts you must seek medical attention immediately by calling 911 or calling your MD immediately  if symptoms less severe.  You must read complete instructions/literature along with all the possible adverse reactions/side effects for all the Medicines you take and that have been prescribed to you. Take any new Medicines after you have completely understood and accpet all the possible adverse reactions/side  effects.   Do not drive when taking Pain medications or sleeping medications (Benzodaizepines)  Do not take more than prescribed Pain, Sleep and Anxiety Medications. It is not advisable to combine anxiety,sleep and pain medications without talking with your primary care practitioner  Special Instructions: If you have smoked or chewed Tobacco  in the last 2 yrs please stop smoking, stop any regular Alcohol  and or any Recreational drug use.  Wear Seat belts while driving.  Please note: You were cared for by a hospitalist during your hospital stay. Once you are discharged, your primary care physician will handle any further medical issues. Please note that NO REFILLS for any discharge medications will be authorized once you are discharged, as it is imperative that you return to your primary care physician (or establish a relationship with a primary care physician if you do not have one) for your post hospital discharge needs so that they can reassess your need for medications and monitor your lab values.   Increase activity slowly   Complete by: As directed       Allergies as of 01/02/2022       Reactions   Kiwi Extract Swelling   Mouth swelling.   Other Swelling, Other (See Comments)   EGGPLANT. Mouth swelling.   Penicillins Rash   Reaction: unknown   Peach [prunus Persica] Other (See Comments)   Burns mouth   Ancef [cefazolin] Rash   Latex Rash   Levaquin [levofloxacin] Rash        Medication List     TAKE these medications    albuterol 108 (90 Base) MCG/ACT inhaler Commonly known as: VENTOLIN HFA Inhale 2 puffs by mouth  into the lungs every 6 (six) hours as needed for wheezing.   ARIPiprazole 10 MG tablet Commonly known as: ABILIFY Take 1 tablet (10 mg total) by mouth every morning.   aspirin 81 MG EC tablet Take 81 mg by mouth every morning.   atorvastatin 80 MG tablet Commonly known as: LIPITOR TAKE 1 TABLET (80 MG TOTAL) BY MOUTH DAILY. What changed: how much  to take   b complex vitamins capsule Take 1 capsule by mouth daily.   buprenorphine 20 MCG/HR Ptwk Commonly known as: Butrans Place 1 patch onto the skin once a week.   buPROPion 300 MG 24 hr tablet Commonly known as: WELLBUTRIN XL Take 1 tablet (300 mg total) by mouth daily.   clopidogrel 75 MG tablet Commonly known as: PLAVIX TAKE 1 TABLET (75 MG TOTAL) BY MOUTH DAILY WITH BREAKFAST. What changed: how much to take   Dexcom G6 Sensor Misc Use as directed. Change sensor every 10 days   FeroSul 325 (65 FE) MG tablet Generic drug: ferrous sulfate Take 325 mg by mouth daily.   HumaLOG 100 UNIT/ML injection Generic drug: insulin lispro For use in pump, total of 60 units per day   isosorbide mononitrate 30 MG 24 hr tablet Commonly known as: IMDUR Take 1 tablet (30 mg total) by mouth daily.   Jardiance 25 MG Tabs tablet Generic drug: empagliflozin  Take 1 tablet (25 mg total) by mouth daily.   lidocaine 5 % Commonly known as: LIDODERM Apply 1 - 3 patches on to the skin as directed. 12 hours on and 12 hours off.   metoCLOPramide 10 MG tablet Commonly known as: REGLAN Take 10 mg by mouth daily as needed for nausea or vomiting (upset stomach).   metoprolol succinate 25 MG 24 hr tablet Commonly known as: Toprol XL Take 1 tablet (25 mg total) by mouth daily.   ondansetron 4 MG disintegrating tablet Commonly known as: ZOFRAN-ODT Take 1 tablet (4 mg total) by mouth every 8 (eight) hours as needed for nausea or vomiting.   Ozempic (2 MG/DOSE) 8 MG/3ML Sopn Generic drug: Semaglutide (2 MG/DOSE) Inject 2 mg into the skin once a week.   pantoprazole 40 MG tablet Commonly known as: PROTONIX Take 1 tablet (40 mg total) by mouth daily.   tamsulosin 0.4 MG Caps capsule Commonly known as: FLOMAX Take 1 capsule (0.4 mg total) by mouth daily.   testosterone 50 MG/5GM (1%) Gel Commonly known as: ANDROGEL Place 5 g (1 packet) onto the skin daily. Apply to shoulders and upper  arms   tiZANidine 4 MG tablet Commonly known as: ZANAFLEX Take 2 tablets (8 mg total) by mouth every 8 (eight) hours as needed for muscle spasms.        Follow-up Information     Everrett Coombe, DO. Schedule an appointment as soon as possible for a visit in 1 week(s).   Specialty: Family Medicine Contact information: [address]  [address] [place] [address] [phone]         Meriam Sprague, MD Follow up in 1 month(s).   Specialties: Cardiology, Radiology Contact information: [address] [place] [address] [phone]         Quintella Reichert, MD Follow up in 1 month(s).   Specialty: Cardiology Contact information: [address] [place] [address] [phone]                Allergies  Allergen Reactions   Kiwi Extract Swelling    Mouth swelling.    Other Swelling and Other (See Comments)    EGGPLANT. Mouth swelling.   Penicillins Rash    Reaction: unknown   Peach [Prunus Persica] Other (See Comments)    Burns mouth   Ancef [Cefazolin] Rash   Latex Rash   Levaquin [Levofloxacin] Rash     Other Procedures/Studies: DG Chest 2 View  Result Date: 12/29/2021 CLINICAL DATA:  Chest pain EXAM: CHEST - 2 VIEW COMPARISON:  10/31/2021 FINDINGS: The heart size and mediastinal contours are within normal limits. No focal airspace consolidation, pleural effusion, or pneumothorax. The visualized skeletal structures are unremarkable. Interval placement of thoracic spinal stimulator with leads in the mid to lower thoracic spine. IMPRESSION: No active cardiopulmonary disease. Electronically Signed   By: Duanne Guess D.O.   On: 12/29/2021 20:46   DG Abd 1 View  Result Date: 12/31/2021 CLINICAL DATA:  Small bowel obstruction. Patient denies abdominal pain. Diarrhea this morning. EXAM: ABDOMEN - 1 VIEW COMPARISON:  Abdominal x-rays from yesterday. FINDINGS: Oral contrast throughout the colon. No  residual dilated small bowel loops. No acute osseous abnormality. IMPRESSION: 1. Resolved partial small bowel obstruction. Electronically Signed   By: Obie Dredge M.D.   On: 12/31/2021 09:08   CT ABDOMEN PELVIS W CONTRAST  Result Date: 12/30/2021 CLINICAL DATA:  Acute abdominal pain. EXAM: CT ABDOMEN  AND PELVIS WITH CONTRAST TECHNIQUE: Multidetector CT imaging of the abdomen and pelvis was performed using the standard protocol following bolus administration of intravenous contrast. RADIATION DOSE REDUCTION: This exam was performed according to the departmental dose-optimization program which includes automated exposure control, adjustment of the mA and/or kV according to patient size and/or use of iterative reconstruction technique. CONTRAST:  80mL OMNIPAQUE IOHEXOL 300 MG/ML  SOLN COMPARISON:  CT angiogram chest abdomen and pelvis 08/11/2020. FINDINGS: Lower chest: There is atelectasis in the lung bases. Hepatobiliary: No focal liver abnormality is seen. Status post cholecystectomy. No biliary dilatation. Pancreas: Unremarkable. No pancreatic ductal dilatation or surrounding inflammatory changes. Spleen: Normal in size without focal abnormality. There are calcified granulomas in the spleen. Adrenals/Urinary Tract: 2.7 cm cyst again seen in the right kidney. There is no hydronephrosis or perinephric stranding. Adrenal glands and bladder are within normal limits. Stomach/Bowel: Stomach is dilated with air-fluid level. There are multiple dilated small bowel loops with air-fluid levels measuring up to 3.9 cm in diameter. Definitive transition point is not located, but ileal loops are decompressed in the right abdomen. There is mesenteric edema associated with dilated small bowel. No pneumatosis or free air. Colon is nondilated. Appendix is within normal limits. There is colonic diverticulosis without evidence for acute diverticulitis. Vascular/Lymphatic: Aortic atherosclerosis. No enlarged abdominal or pelvic  lymph nodes. Reproductive: Prostate gland is prominent in size. Penile pump present. Other: Trace free fluid in the right upper quadrant. Small fat containing bilateral inguinal hernias. Musculoskeletal: L4-5 posterior fusion hardware is present. IMPRESSION: 1. Mid small bowel obstruction. Definitive transition point not visualized. Mesenteric edema is present which can be seen with vascular compromise. 2. Trace free fluid in the right upper quadrant. 3. Colonic diverticulosis without evidence for acute diverticulitis. 4.  Aortic Atherosclerosis (ICD10-I70.0). Electronically Signed   By: Darliss Cheney M.D.   On: 12/30/2021 01:06   DG Abd Portable 1V-Small Bowel Obstruction Protocol-initial, 8 hr delay  Result Date: 12/30/2021 CLINICAL DATA:  Small bowel obstruction EXAM: PORTABLE ABDOMEN - 1 VIEW COMPARISON:  CT today. FINDINGS: Oral contrast material seen within stomach and dilated mid abdominal small bowel loops. Relative paucity of gas throughout the small bowel. There is likely contrast also within decompressed large bowel. No free air organomegaly. IMPRESSION: Fluid/contrast filled dilated small bowel loops compatible with small bowel obstruction. There does appear to be some contrast within the large bowel compatible with partial bowel obstruction. Electronically Signed   By: Charlett Nose M.D.   On: 12/30/2021 21:32     TODAY-DAY OF DISCHARGE:  Subjective:   Brandon [name] today has no headache,no chest abdominal pain,no new weakness tingling or numbness, feels much better wants to go home today.   Objective:   Blood pressure 131/80, pulse 77, temperature 97.6 F (36.4 C), temperature source Oral, resp. rate 20, height 5\' 9"  (1.753 m), weight 83.9 kg, SpO2 95 %. No intake or output data in the 24 hours ending 01/02/22 0849 Filed Weights   12/29/21 2025  Weight: 83.9 kg    Exam: Awake Alert, Oriented *3, No new F.N deficits, Normal affect .AT,PERRAL Supple Neck,No JVD, No cervical  lymphadenopathy appriciated.  Symmetrical Chest wall movement, Good air movement bilaterally, CTAB RRR,No Gallops,Rubs or new Murmurs, No Parasternal Heave +ve B.Sounds, Abd Soft, Non tender, No organomegaly appriciated, No rebound -guarding or rigidity. No Cyanosis, Clubbing or edema, No new Rash or bruise   PERTINENT RADIOLOGIC STUDIES: DG Abd 1 View  Result Date: 12/31/2021 CLINICAL DATA:  Small bowel  obstruction. Patient denies abdominal pain. Diarrhea this morning. EXAM: ABDOMEN - 1 VIEW COMPARISON:  Abdominal x-rays from yesterday. FINDINGS: Oral contrast throughout the colon. No residual dilated small bowel loops. No acute osseous abnormality. IMPRESSION: 1. Resolved partial small bowel obstruction. Electronically Signed   By: Obie Dredge M.D.   On: 12/31/2021 09:08     PERTINENT LAB RESULTS: CBC: Recent Labs    01/01/22 0408  WBC 4.1  HGB 13.9  HCT 40.2  PLT 166   CMET CMP     Component Value Date/Time   NA 139 01/02/2022 0112   NA 140 01/02/2021 1014   K 3.4 (L) 01/02/2022 0112   CL 108 01/02/2022 0112   CO2 26 01/02/2022 0112   GLUCOSE 96 01/02/2022 0112   BUN <5 (L) 01/02/2022 0112   BUN 12 01/02/2021 1014   CREATININE 0.59 (L) 01/02/2022 0112   CREATININE 0.73 11/22/2021 0000   CALCIUM 8.7 (L) 01/02/2022 0112   PROT 7.0 12/30/2021 0604   PROT 7.0 10/23/2020 0824   ALBUMIN 3.7 12/30/2021 0604   ALBUMIN 4.5 10/23/2020 0824   AST 23 12/30/2021 0604   ALT 14 12/30/2021 0604   ALKPHOS 69 12/30/2021 0604   BILITOT 1.2 12/30/2021 0604   BILITOT 0.4 10/23/2020 0824   GFRNONAA >60 01/02/2022 0112   GFRAA >60 04/17/2020 0510    GFR Estimated Creatinine Clearance: 90.8 mL/min (A) (by C-G formula based on SCr of 0.59 mg/dL (L)). No results for input(s): LIPASE, AMYLASE in the last 72 hours. No results for input(s): CKTOTAL, CKMB, CKMBINDEX, TROPONINI in the last 72 hours. Invalid input(s): POCBNP No results for input(s): DDIMER in the last 72 hours. No  results for input(s): HGBA1C in the last 72 hours. No results for input(s): CHOL, HDL, LDLCALC, TRIG, CHOLHDL, LDLDIRECT in the last 72 hours. No results for input(s): TSH, T4TOTAL, T3FREE, THYROIDAB in the last 72 hours.  Invalid input(s): FREET3 No results for input(s): VITAMINB12, FOLATE, FERRITIN, TIBC, IRON, RETICCTPCT in the last 72 hours. Coags: No results for input(s): INR in the last 72 hours.  Invalid input(s): PT Microbiology: No results found for this or any previous visit (from the past 240 hour(s)).  FURTHER DISCHARGE INSTRUCTIONS:  Get Medicines reviewed and adjusted: Please take all your medications with you for your next visit with your Primary MD  Laboratory/radiological data: Please request your Primary MD to go over all hospital tests and procedure/radiological results at the follow up, please ask your Primary MD to get all Hospital records sent to his/her office.  In some cases, they will be blood work, cultures and biopsy results pending at the time of your discharge. Please request that your primary care M.D. goes through all the records of your hospital data and follows up on these results.  Also Note the following: If you experience worsening of your admission symptoms, develop shortness of breath, life threatening emergency, suicidal or homicidal thoughts you must seek medical attention immediately by calling 911 or calling your MD immediately  if symptoms less severe.  You must read complete instructions/literature along with all the possible adverse reactions/side effects for all the Medicines you take and that have been prescribed to you. Take any new Medicines after you have completely understood and accpet all the possible adverse reactions/side effects.   Do not drive when taking Pain medications or sleeping medications (Benzodaizepines)  Do not take more than prescribed Pain, Sleep and Anxiety Medications. It is not advisable to combine anxiety,sleep and  pain medications  without talking with your primary care practitioner  Special Instructions: If you have smoked or chewed Tobacco  in the last 2 yrs please stop smoking, stop any regular Alcohol  and or any Recreational drug use.  Wear Seat belts while driving.  Please note: You were cared for by a hospitalist during your hospital stay. Once you are discharged, your primary care physician will handle any further medical issues. Please note that NO REFILLS for any discharge medications will be authorized once you are discharged, as it is imperative that you return to your primary care physician (or establish a relationship with a primary care physician if you do not have one) for your post hospital discharge needs so that they can reassess your need for medications and monitor your lab values.  Total Time spent coordinating discharge including counseling, education and face to face time equals greater than 30 minutes.  SignedJeoffrey Massed 01/02/2022 8:49 AM

## 2022-01-02 NOTE — Progress Notes (Signed)
? ? ?   ?Subjective: ?CC: ?Doing well and feeling much better. No abdominal pain this am. Did not require any further doses of Iv dilaudid after I saw him yesterday. Tolerating fld without n/v. Passing flatus. Had a few soft bm's yesterday - no further diarrhea.  ? ?Objective: ?Vital signs in last 24 hours: ?Temp:  [97.6 ?F (36.4 ?C)-98.6 ?F (37 ?C)] 97.9 ?F (36.6 ?C) (05/10 3762) ?Pulse Rate:  [65-85] 65 (05/10 0333) ?Resp:  [16-20] 20 (05/10 0333) ?BP: (114-143)/(66-74) 141/71 (05/10 0333) ?SpO2:  [92 %-95 %] 95 % (05/09 2056) ?Last BM Date : 01/01/22 ? ?Intake/Output from previous day: ?No intake/output data recorded. ?Intake/Output this shift: ?No intake/output data recorded. ? ?PE: ?Gen:  Alert, NAD, pleasant ?Abd: Soft, less distension - appears resolved, mild tenderness in the epigastrium but is otherwise NT. No peritonitis, +BS ?Psych: A&Ox3  ? ?Lab Results:  ?Recent Labs  ?  01/01/22 ?0408  ?WBC 4.1  ?HGB 13.9  ?HCT 40.2  ?PLT 166  ? ?BMET ?Recent Labs  ?  01/01/22 ?0408 01/02/22 ?0112  ?NA 140 139  ?K 3.2* 3.4*  ?CL 106 108  ?CO2 27 26  ?GLUCOSE 83 96  ?BUN 6* <5*  ?CREATININE 0.75 0.59*  ?CALCIUM 8.4* 8.7*  ? ?PT/INR ?No results for input(s): LABPROT, INR in the last 72 hours. ?CMP  ?   ?Component Value Date/Time  ? NA 139 01/02/2022 0112  ? NA 140 01/02/2021 1014  ? K 3.4 (L) 01/02/2022 0112  ? CL 108 01/02/2022 0112  ? CO2 26 01/02/2022 0112  ? GLUCOSE 96 01/02/2022 0112  ? BUN <5 (L) 01/02/2022 0112  ? BUN 12 01/02/2021 1014  ? CREATININE 0.59 (L) 01/02/2022 0112  ? CREATININE 0.73 11/22/2021 0000  ? CALCIUM 8.7 (L) 01/02/2022 0112  ? PROT 7.0 12/30/2021 0604  ? PROT 7.0 10/23/2020 0824  ? ALBUMIN 3.7 12/30/2021 0604  ? ALBUMIN 4.5 10/23/2020 0824  ? AST 23 12/30/2021 0604  ? ALT 14 12/30/2021 0604  ? ALKPHOS 69 12/30/2021 0604  ? BILITOT 1.2 12/30/2021 0604  ? BILITOT 0.4 10/23/2020 0824  ? GFRNONAA >60 01/02/2022 0112  ? GFRAA >60 04/17/2020 0510  ? ?Lipase  ?   ?Component Value Date/Time  ? LIPASE  42 12/29/2021 2051  ? ? ?Studies/Results: ?DG Abd 1 View ? ?Result Date: 12/31/2021 ?CLINICAL DATA:  Small bowel obstruction. Patient denies abdominal pain. Diarrhea this morning. EXAM: ABDOMEN - 1 VIEW COMPARISON:  Abdominal x-rays from yesterday. FINDINGS: Oral contrast throughout the colon. No residual dilated small bowel loops. No acute osseous abnormality. IMPRESSION: 1. Resolved partial small bowel obstruction. Electronically Signed   By: Obie Dredge M.D.   On: 12/31/2021 09:08   ? ?Anti-infectives: ?Anti-infectives (From admission, onward)  ? ? None  ? ?  ? ? ? ?Assessment/Plan ?pSBO  ?- History of multiple inguinal hernia repairs, umbilical hernia repair, cholecystectomy  ?- X-ray with oral contrast throughout colon and no residual dilated small bowel loops on x-ray 5/8.   ?- Pain resolved. Tolerating liquids without n/v. Has ROBF. Exam reassuring. If tolerates soft diet this am, he can be d/c'd from our standpoint. Will reach out to [facility] and let them know.  ?  ?FEN - Soft diet ?VTE - SCD's, Lovenox and Plavix ?ID - none ?Admit - [facility] service ?  ?Below per [facility]  ?Hypertension ?Hyperlipidemia ?CAD status post DES 12/2020 ?Chronic pain ?Diabetes ?Depression/anxiety ? ? LOS: 3 days  ? ? ?Jacinto Halim , PA-C ?  [facility] ?01/02/2022, 7:32 AM ?Please see Amion for pager number during day hours 7:00am-4:30pm ? ?

## 2022-01-04 ENCOUNTER — Other Ambulatory Visit (HOSPITAL_COMMUNITY): Payer: Self-pay

## 2022-01-04 ENCOUNTER — Other Ambulatory Visit: Payer: Self-pay | Admitting: Family Medicine

## 2022-01-04 ENCOUNTER — Ambulatory Visit: Payer: No Typology Code available for payment source | Admitting: Endocrinology

## 2022-01-04 ENCOUNTER — Other Ambulatory Visit (HOSPITAL_BASED_OUTPATIENT_CLINIC_OR_DEPARTMENT_OTHER): Payer: Self-pay

## 2022-01-04 MED ORDER — TESTOSTERONE 50 MG/5GM (1%) TD GEL
5.0000 g | Freq: Every day | TRANSDERMAL | 0 refills | Status: DC
  Filled 2022-01-04: qty 150, 30d supply, fill #0

## 2022-01-07 ENCOUNTER — Other Ambulatory Visit (HOSPITAL_BASED_OUTPATIENT_CLINIC_OR_DEPARTMENT_OTHER): Payer: Self-pay

## 2022-01-07 ENCOUNTER — Other Ambulatory Visit (HOSPITAL_COMMUNITY): Payer: Self-pay

## 2022-01-07 ENCOUNTER — Encounter: Payer: Self-pay | Admitting: Internal Medicine

## 2022-01-07 ENCOUNTER — Ambulatory Visit (INDEPENDENT_AMBULATORY_CARE_PROVIDER_SITE_OTHER): Payer: No Typology Code available for payment source | Admitting: Internal Medicine

## 2022-01-07 VITALS — BP 110/80 | HR 96 | Ht 69.0 in | Wt 191.0 lb

## 2022-01-07 DIAGNOSIS — E1169 Type 2 diabetes mellitus with other specified complication: Secondary | ICD-10-CM | POA: Diagnosis not present

## 2022-01-07 DIAGNOSIS — Z794 Long term (current) use of insulin: Secondary | ICD-10-CM

## 2022-01-07 DIAGNOSIS — E785 Hyperlipidemia, unspecified: Secondary | ICD-10-CM

## 2022-01-07 DIAGNOSIS — I25118 Atherosclerotic heart disease of native coronary artery with other forms of angina pectoris: Secondary | ICD-10-CM

## 2022-01-07 DIAGNOSIS — E1159 Type 2 diabetes mellitus with other circulatory complications: Secondary | ICD-10-CM | POA: Diagnosis not present

## 2022-01-07 LAB — POCT GLYCOSYLATED HEMOGLOBIN (HGB A1C): Hemoglobin A1C: 6.2 % — AB (ref 4.0–5.6)

## 2022-01-07 MED ORDER — GLUCAGON 3 MG/DOSE NA POWD
3.0000 mg | Freq: Once | NASAL | 11 refills | Status: DC | PRN
  Filled 2022-01-07: qty 1, 1d supply, fill #0

## 2022-01-07 NOTE — Patient Instructions (Addendum)
Please use the following pump settings: ?- Basal rates: ?12 am: 0.5 units/h ?- Insulin to carb ratio: ?12 am: 1:5 >> 1:15 ?- Target: ?12 am: 1:100 ?- Correction factor (insulin sensitivity factor):  ?12 am:  ?- Active insulin time: 5h ?- Changes infusion site: q3 days ? ?Please do the following approximately 15 minutes before every meal: ?- Enter carbs (C) ?- Enter sugars (S) ?- Start insulin bolus (I) ? ?Please continue: ?- Jardiance 25 mg before breakfast ?- Ozempic 2 mg weekly  ? ?Please return in 4 months. ? ?

## 2022-01-07 NOTE — Progress Notes (Signed)
Patient ID: Brandon [name], male   DOB: 09-23-1955, 66 y.o.   MRN: [id number]  HPI: Brandon [name] is a 66 y.o.-year-old male, returning for follow-up for DM2, dx in 2004, insulin-dependent since 2012, uncontrolled, with complications (CAD, gastroparesis).  He previously saw Dr. Everardo All, last visit 1.5 months ago.  Patient was just admitted 12/29/2021 with SBO.  This is his second episode.  Previous was before starting Ozempic.   During this admission, lipase was normal, at 42.  He has a history of abdominal hernia repair x3 in last 2 years. He has constipation - on Miralax.  Insulin pump: - T: slim x2  CGM: - Dexcom G6  Insulin: - Humalog  Supplier: [employer]  Reviewed HbA1c: Lab Results  Component Value Date   HGBA1C 6.3 (H) 11/01/2021   HGBA1C 6.4 (A) 09/04/2021   HGBA1C 6.5 (H) 08/28/2021   HGBA1C 6.1 (A) 05/29/2021   HGBA1C 6.3 (A) 03/22/2021   HGBA1C 6.8 (A) 01/16/2021   HGBA1C 6.5 (A) 11/15/2020   HGBA1C 10.1 (H) 08/13/2020   HGBA1C 7.2 (H) 04/07/2020   Pt is on a regimen of: - Jardiance 25 mg before breakfast - Ozempic 2 mg weekly - lost 65 lbs in last 1.5 years  Insulin pump settings: - Basal rates: 12 am: 0.5 units/h - Insulin to carb ratio: 12 am: 1:5 - Target: 12 am: 1:100 - Correction factor (insulin sensitivity factor):  12 am:  - Active insulin time: 5h - Changes infusion site: q3 days  Total daily dose from basal insulin: 16 units (59%) Total daily dose from bolus insulin: 12 units (41%)  He checks his sugars with a Dexcom CGM, more than 4 times a day:   Lowest sugar was 60; he has hypoglycemia awareness at 70.  Highest sugar was 359.  Glucometer:Freestyle  - no CKD, last BUN/creatinine:  Lab Results  Component Value Date   BUN <5 (L) 01/02/2022   BUN 6 (L) 01/01/2022   CREATININE 0.59 (L) 01/02/2022   CREATININE 0.75 01/01/2022  Not on ACE inhibitor/ARB.  -+ Dyslipidemia; last set of lipids: Lab Results  Component Value Date    CHOL 81 11/01/2021   HDL 22 (L) 11/01/2021   LDLCALC 37 11/01/2021   TRIG 110 11/01/2021   CHOLHDL 3.7 11/01/2021  On Lipitor 80 mg daily.  - last eye exam was in 03/2021. No DR.   - no numbness and tingling in his feet.  Last foot exam in 05/2021.  On ASA 81.  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  ROS: + see HPI No increased urination, blurry vision, nausea, chest pain.  Past Medical History:  Diagnosis Date   Anxiety    Basal cell carcinoma (BCC) in situ of skin    Chronic back pain    has spinal cord stimulator and wears buprenorphine patch   Colon polyps    Coronary artery disease    S/p DES to mRCA in 12/21 // S/p DES to LCx x 2 in 12/2020 // Diff dz in small to mod Dx (not a good target for PCI); OM1 100 CTO; severe dz in small OM2 and PDA >> Med Rx   Depression    Diabetes mellitus without complication (HCC)    HLD (hyperlipidemia)    Hypertension    SBO (small bowel obstruction) (HCC)    Past Surgical History:  Procedure Laterality Date   BACK SURGERY     CARDIAC CATHETERIZATION     CATARACT EXTRACTION W/  INTRAOCULAR LENS  IMPLANT, BILATERAL     CHOLECYSTECTOMY, LAPAROSCOPIC     CORONARY STENT INTERVENTION N/A 08/15/2020   Procedure: CORONARY STENT INTERVENTION;  Surgeon: Corky Crafts, MD;  Location: [facility] INVASIVE CV LAB;  Service: Cardiovascular;  Laterality: N/A;   CORONARY STENT INTERVENTION N/A 01/17/2021   Procedure: CORONARY STENT INTERVENTION;  Surgeon: Kathleene Hazel, MD;  Location: [facility] INVASIVE CV LAB;  Service: Cardiovascular;  Laterality: N/A;   EYE SURGERY     INTRAVASCULAR ULTRASOUND/IVUS N/A 08/15/2020   Procedure: Intravascular Ultrasound/IVUS;  Surgeon: Corky Crafts, MD;  Location: [facility] INVASIVE CV LAB;  Service: Cardiovascular;  Laterality: N/A;   LEFT HEART CATH AND CORONARY ANGIOGRAPHY N/A 08/15/2020   Procedure: LEFT HEART CATH AND CORONARY ANGIOGRAPHY;  Surgeon: Corky Crafts, MD;  Location: [facility] INVASIVE CV LAB;  Service: Cardiovascular;  Laterality: N/A;   LEFT HEART CATH AND CORONARY ANGIOGRAPHY N/A 01/17/2021   Procedure: LEFT HEART CATH AND CORONARY ANGIOGRAPHY;  Surgeon: Kathleene Hazel, MD;  Location: [facility] INVASIVE CV LAB;  Service: Cardiovascular;  Laterality: N/A;   LUMBAR FUSION     L3-5 with rods   XI ROBOTIC ASSISTED INGUINAL HERNIA REPAIR WITH MESH     x 3 surgeries   Social History   Socioeconomic History   Marital status: Married    Spouse name: Not on file   Number of children: 4   Years of education: 16   Highest education level: Not on file  Occupational History   Occupation: disabled  Tobacco Use   Smoking status: Former    Types: Cigarettes    Quit date: 2007    Years since quitting: 16.3   Smokeless tobacco: Never  Vaping Use   Vaping Use: Never used  Substance and Sexual Activity   Alcohol use: Not Currently   Drug use: Not Currently   Sexual activity: Yes  Other Topics Concern   Not on file  Social History Narrative   Not on file   Social Determinants of Health   Financial Resource Strain: Not on file  Food Insecurity: Not on file  Transportation Needs: Not on file  Physical Activity: Not on file  Stress: Not on file  Social Connections: Not on file  Intimate Partner Violence: Not on file   Current Outpatient Medications on File Prior to Visit  Medication Sig Dispense Refill   albuterol (VENTOLIN HFA) 108 (90 Base) MCG/ACT inhaler Inhale 2 puffs by mouth  into the lungs every 6 (six) hours as needed for wheezing. 17 g 11   ARIPiprazole (ABILIFY) 10 MG tablet Take 1 tablet (10 mg total) by mouth every morning. 90 tablet 1   aspirin 81 MG EC tablet Take 81 mg by mouth every morning.     atorvastatin (LIPITOR) 80 MG tablet TAKE 1 TABLET (80 MG TOTAL) BY MOUTH DAILY. (Patient taking differently: Take 80 mg by mouth daily.) 90 tablet 3   b complex vitamins capsule Take 1 capsule by mouth daily.      buprenorphine (BUTRANS) 20 MCG/HR PTWK Place 1 patch onto the skin once a week. 4 patch 3   buPROPion (WELLBUTRIN XL) 300 MG 24 hr tablet Take 1 tablet (300 mg total) by mouth daily. 90 tablet 0   clopidogrel (PLAVIX) 75 MG tablet TAKE 1 TABLET (75 MG TOTAL) BY MOUTH DAILY WITH BREAKFAST. (Patient taking differently: Take 75 mg by mouth daily with breakfast.) 90 tablet 3   Continuous Blood Gluc Sensor (DEXCOM G6 SENSOR) MISC Use as  directed. Change sensor every 10 days 9 each 3   empagliflozin (JARDIANCE) 25 MG TABS tablet Take 1 tablet (25 mg total) by mouth daily. 90 tablet 3   insulin lispro (HUMALOG) 100 UNIT/ML injection For use in pump, total of 60 units per day 60 mL 3   Iron, Ferrous Sulfate, 325 (65 Fe) MG TABS Take 325 mg by mouth daily. 90 tablet 1   isosorbide mononitrate (IMDUR) 30 MG 24 hr tablet Take 1 tablet (30 mg total) by mouth daily. 90 tablet 2   lidocaine (LIDODERM) 5 % Apply 1 - 3 patches on to the skin as directed. 12 hours on and 12 hours off. 90 patch 2   metoCLOPramide (REGLAN) 10 MG tablet Take 10 mg by mouth daily as needed for nausea or vomiting (upset stomach).     metoprolol succinate (TOPROL XL) 25 MG 24 hr tablet Take 1 tablet (25 mg total) by mouth daily. 90 tablet 2   ondansetron (ZOFRAN-ODT) 4 MG disintegrating tablet Take 1 tablet (4 mg total) by mouth every 8 (eight) hours as needed for nausea or vomiting. 20 tablet 0   pantoprazole (PROTONIX) 40 MG tablet Take 1 tablet (40 mg total) by mouth daily. 90 tablet 0   Semaglutide, 2 MG/DOSE, (OZEMPIC, 2 MG/DOSE,) 8 MG/3ML SOPN Inject 2 mg into the skin once a week. 9 mL 3   tamsulosin (FLOMAX) 0.4 MG CAPS capsule Take 1 capsule (0.4 mg total) by mouth daily. 90 capsule 1   testosterone (ANDROGEL) 50 MG/5GM (1%) GEL Place 5 g (1 packet) onto the to shoulders and upper arms daily as directed. 150 g 0   tiZANidine (ZANAFLEX) 4 MG tablet Take 2 tablets (8 mg total) by mouth every 8 (eight) hours as needed for muscle  spasms. 180 tablet    No current facility-administered medications on file prior to visit.   Allergies  Allergen Reactions   Kiwi Extract Swelling    Mouth swelling.    Other Swelling and Other (See Comments)    EGGPLANT. Mouth swelling.   Penicillins Rash    Reaction: unknown   Peach [Prunus Persica] Other (See Comments)    Burns mouth   Ancef [Cefazolin] Rash   Latex Rash   Levaquin [Levofloxacin] Rash   Family History  Problem Relation Age of Onset   COPD Mother    Anxiety disorder Mother    Depression Mother    Cancer Father    Anxiety disorder Father    Depression Father    Other Father    Diabetes Neg Hx     PE: BP 110/80 (BP Location: Right Arm, Patient Position: Sitting, Cuff Size: Normal)   Pulse 96   Ht 5\' 9"  (1.753 m)   Wt 191 lb (86.6 kg)   SpO2 95%   BMI 28.21 kg/m  Wt Readings from Last 3 Encounters:  01/07/22 191 lb (86.6 kg)  12/29/21 185 lb (83.9 kg)  12/27/21 193 lb (87.5 kg)   Constitutional: overweight, in NAD Eyes: PERRLA, EOMI, no exophthalmos ENT: moist mucous membranes, no thyromegaly, no cervical lymphadenopathy Cardiovascular: RRR, No MRG Respiratory: CTA B Musculoskeletal: no deformities, strength intact in all 4 Skin: moist, warm, no rashes Neurological: no tremor with outstretched hands, DTR normal in all 4  ASSESSMENT: 1. DM2, insulin-dependent, uncontrolled, with complications - CAD - s/p 3 stents - Gastroparesis  2. HL  PLAN:  1. Patient with long-standing, uncontrolled diabetes, on SGLT2 inhibitor, weekly GLP-1 receptor agonist, and insulin pump, with good control. -  He was recently admitted for small bowel obstruction and at this visit we discussed about the possibility of coming off Ozempic.  However, he greatly benefited from Ozempic losing a significant amount of weight and he would like to continue with it if possible.  He had the first episode of SBO before starting Ozempic.  He does have constipation but this is  controlled with MiraLAX.  I advised him to discuss with PCP and see whether he feels that Ozempic may be contributing to SBO for him, in which case, you may need to stop. CGM interpretation: -At today's visit, we reviewed his CGM downloads: It appears that 73% of values are in target range (goal >70%), while 27% are higher than 180 (goal <25%), and 0% are lower than 70 (goal <4%).  The calculated average blood sugar is 158.  -Reviewing the CGM trends, it appears that the sugars before his admission were worse and they improved after he was discharged few days ago.  Problems identified:  - He is not bolusing consistently for meals.  He mostly relies on automatic boluses.  He rarely eats reduce his carbs or give mealtime bolus.  We discussed that this is important for better diabetes control.  However, the insulin to carb ratio that he has right now appears to be very strict, at 1: 5 and I advised him to change this to 1: 15. - we discussed about the importance of not waiting until his sugars are high after meal to bolus for that meal.  Discussed that the CGM values are lagging behind the blood sugars - for now we will continue the same basal rates, which are adequate - I advised him to try to bolus for the meals 15 minutes before each meal. - we discussed that whenever correcting high or low blood sugar, he needs to follow the blood sugar with his glucometer, not his CGM. - I sent a prescription for glucagon (Baqsimi) to his pharmacy and advised him when he needs to use it. - I suggested to:  Patient Instructions  Please use the following pump settings: - Basal rates: 12 am: 0.5 units/h - Insulin to carb ratio: 12 am: 1:5 >> 1:15 - Target: 12 am: 1:100 - Correction factor (insulin sensitivity factor):  12 am:  - Active insulin time: 5h - Changes infusion site: q3 days  Please do the following approximately 15 minutes before every meal: - Enter carbs (C) - Enter sugars (S) - Start insulin  bolus (I)  Please continue: - Jardiance 25 mg before breakfast - Ozempic 2 mg weekly   Please return in 4 months.  - I introduced the setting change into the pump for him - continue checking sugars at different times of the day - check 4x a day, rotating checks - discussed about CBG targets for treatment: 80-130 mg/dL before meals and <604 mg/dL after meals; target [id number] <7%. - given foot care handout  - given instructions for hypoglycemia management "15-15 rule"  - advised for yearly eye exams  - Return to clinic in 4 mo   2. HL - Reviewed latest lipid panel from 2 months ago: Fractions at goal with exception of a slightly low HDL: Lab Results  Component Value Date   CHOL 81 11/01/2021   HDL 22 (L) 11/01/2021   LDLCALC 37 11/01/2021   TRIG 110 11/01/2021   CHOLHDL 3.7 11/01/2021  - Continues Lipitor 80 without side effects.  Total time spent for the visit: 40 min, in precharting, reviewing  Dr. George Hugh note, in reviewing his pump downloads, discussing his hypo- and hyper-glycemic episodes, reviewing previous labs and pump settings and developing a plan to avoid hypo- and hyper-glycemia.  Education about managing hypoglycemia was also provided.  Carlus Pavlov, MD PhD [facility]

## 2022-01-08 ENCOUNTER — Other Ambulatory Visit (HOSPITAL_BASED_OUTPATIENT_CLINIC_OR_DEPARTMENT_OTHER): Payer: Self-pay

## 2022-01-09 ENCOUNTER — Ambulatory Visit (INDEPENDENT_AMBULATORY_CARE_PROVIDER_SITE_OTHER): Payer: No Typology Code available for payment source | Admitting: Family Medicine

## 2022-01-09 ENCOUNTER — Encounter: Payer: Self-pay | Admitting: Family Medicine

## 2022-01-09 VITALS — BP 112/68 | HR 86 | Ht 69.0 in | Wt 192.0 lb

## 2022-01-09 DIAGNOSIS — K56609 Unspecified intestinal obstruction, unspecified as to partial versus complete obstruction: Secondary | ICD-10-CM | POA: Diagnosis not present

## 2022-01-09 DIAGNOSIS — E876 Hypokalemia: Secondary | ICD-10-CM

## 2022-01-09 DIAGNOSIS — K56600 Partial intestinal obstruction, unspecified as to cause: Secondary | ICD-10-CM | POA: Insufficient documentation

## 2022-01-09 NOTE — Assessment & Plan Note (Signed)
Improved since hospitalization.  He has added miralax and is pushing fluids. He remains on a soft diet and is advancing as tolerated.  He is limiting roughage for now.  He is instructed to contact the clinic if symptoms worsen.  May need to consider decrease or discontinuation in GLP-1 if he continues to have significant constipation.  Rechecking potassium and magnesium today.  ?

## 2022-01-09 NOTE — Progress Notes (Signed)
?Brandon [name] - 66 y.o. male MRN [id number]  Date of birth: [birth year] ? ?Subjective ?No chief complaint on file. ? ? ?HPI ?Brandon [name] is a 66 y.o. male here today for hospital follow up.  Initially seen by me on 12/27/21 after having bout of nausea, vomiting, diarrhea.  Symptoms had resolved when I saw him, however he presented to the ED with worsening abdominal pain, nausea and inability to tolerate PO.  Found ot have SBO.  Admitted for observation and fluids and tolerated advancement in diet prior to discharge.  Today he reports that he is still having some mild pain, but overall much better. He is being careful with his diet, currently doing a mechanical soft diet.  ? ?ROS:  A comprehensive ROS was completed and negative except as noted per HPI ? ?Allergies  ?Allergen Reactions  ? Kiwi Extract Swelling  ?  Mouth swelling. ?  ? Other Swelling and Other (See Comments)  ?  EGGPLANT. Mouth swelling.  ? Penicillins Rash  ?  Reaction: unknown  ? Peach [Prunus Persica] Other (See Comments)  ?  Burns mouth  ? Ancef [Cefazolin] Rash  ? Latex Rash  ? Levaquin [Levofloxacin] Rash  ? ? ?Past Medical History:  ?Diagnosis Date  ? Anxiety   ? Basal cell carcinoma (BCC) in situ of skin   ? Chronic back pain   ? has spinal cord stimulator and wears buprenorphine patch  ? Colon polyps   ? Coronary artery disease   ? S/p DES to mRCA in 12/21 // S/p DES to LCx x 2 in 12/2020 // Diff dz in small to mod Dx (not a good target for PCI); OM1 100 CTO; severe dz in small OM2 and PDA >> Med Rx  ? Depression   ? Diabetes mellitus without complication (HCC)   ? HLD (hyperlipidemia)   ? Hypertension   ? SBO (small bowel obstruction) (HCC)   ? ? ?Past Surgical History:  ?Procedure Laterality Date  ? BACK SURGERY    ? CARDIAC CATHETERIZATION    ? CATARACT EXTRACTION W/ INTRAOCULAR LENS  IMPLANT, BILATERAL    ? CHOLECYSTECTOMY, LAPAROSCOPIC    ? CORONARY STENT INTERVENTION N/A 08/15/2020  ? Procedure: CORONARY STENT INTERVENTION;  Surgeon:  Corky Crafts, MD;  Location: [facility] INVASIVE CV LAB;  Service: Cardiovascular;  Laterality: N/A;  ? CORONARY STENT INTERVENTION N/A 01/17/2021  ? Procedure: CORONARY STENT INTERVENTION;  Surgeon: Kathleene Hazel, MD;  Location: [facility] INVASIVE CV LAB;  Service: Cardiovascular;  Laterality: N/A;  ? EYE SURGERY    ? INTRAVASCULAR ULTRASOUND/IVUS N/A 08/15/2020  ? Procedure: Intravascular Ultrasound/IVUS;  Surgeon: Corky Crafts, MD;  Location: [facility] INVASIVE CV LAB;  Service: Cardiovascular;  Laterality: N/A;  ? LEFT HEART CATH AND CORONARY ANGIOGRAPHY N/A 08/15/2020  ? Procedure: LEFT HEART CATH AND CORONARY ANGIOGRAPHY;  Surgeon: Corky Crafts, MD;  Location: [facility] INVASIVE CV LAB;  Service: Cardiovascular;  Laterality: N/A;  ? LEFT HEART CATH AND CORONARY ANGIOGRAPHY N/A 01/17/2021  ? Procedure: LEFT HEART CATH AND CORONARY ANGIOGRAPHY;  Surgeon: Kathleene Hazel, MD;  Location: [facility] INVASIVE CV LAB;  Service: Cardiovascular;  Laterality: N/A;  ? LUMBAR FUSION    ? L3-5 with rods  ? XI ROBOTIC ASSISTED INGUINAL HERNIA REPAIR WITH MESH    ? x 3 surgeries  ? ? ?Social History  ? ?Socioeconomic History  ? Marital status: Married  ?  Spouse name: Not on file  ? Number of children: 4  ?  Years of education: 54  ? Highest education level: Not on file  ?Occupational History  ? Occupation: disabled  ?Tobacco Use  ? Smoking status: Former  ?  Types: Cigarettes  ?  Quit date: 2007  ?  Years since quitting: 16.3  ? Smokeless tobacco: Never  ?Vaping Use  ? Vaping Use: Never used  ?Substance and Sexual Activity  ? Alcohol use: Not Currently  ? Drug use: Not Currently  ? Sexual activity: Yes  ?Other Topics Concern  ? Not on file  ?Social History Narrative  ? Not on file  ? ?Social Determinants of Health  ? ?Financial Resource Strain: Not on file  ?Food Insecurity: Not on file  ?Transportation Needs: Not on file  ?Physical Activity: Not on file  ?Stress: Not on file  ?Social Connections: Not on file   ? ? ?Family History  ?Problem Relation Age of Onset  ? COPD Mother   ? Anxiety disorder Mother   ? Depression Mother   ? Cancer Father   ? Anxiety disorder Father   ? Depression Father   ? Other Father   ? Diabetes Neg Hx   ? ? ?Health Maintenance  ?Topic Date Due  ? URINE MICROALBUMIN  Never done  ? Hepatitis C Screening  Never done  ? COLONOSCOPY (Pts 45-40yrs Insurance coverage will need to be confirmed)  Never done  ? COVID-19 Vaccine (3 - Booster for Pfizer series) 01/12/2022 (Originally 08/11/2020)  ? Zoster Vaccines- Shingrix (1 of 2) 03/29/2022 (Originally 09/13/2005)  ? TETANUS/TDAP  12/28/2022 (Originally 09/13/1974)  ? INFLUENZA VACCINE  03/26/2022  ? OPHTHALMOLOGY EXAM  04/24/2022  ? FOOT EXAM  05/29/2022  ? HEMOGLOBIN A1C  07/10/2022  ? Pneumonia Vaccine 86+ Years old  Completed  ? HPV VACCINES  Aged Out  ? ? ? ?----------------------------------------------------------------------------------------------------------------------------------------------------------------------------------------------------------------- ?Physical Exam ?BP 112/68 (BP Location: Left Arm, Patient Position: Sitting, Cuff Size: Normal)   Pulse 86   Ht 5\' 9"  (1.753 m)   Wt 192 lb (87.1 kg)   SpO2 96%   BMI 28.35 kg/m?  ? ?Physical Exam ?Constitutional:   ?   Appearance: Normal appearance.  ?Cardiovascular:  ?   Rate and Rhythm: Normal rate and regular rhythm.  ?Pulmonary:  ?   Effort: Pulmonary effort is normal.  ?   Breath sounds: Normal breath sounds.  ?Abdominal:  ?   General: Bowel sounds are normal. There is no distension.  ?   Palpations: Abdomen is soft.  ?   Tenderness: There is no abdominal tenderness.  ?Neurological:  ?   Mental Status: He is alert.  ?Psychiatric:     ?   Mood and Affect: Mood normal.     ?   Behavior: Behavior normal.   ? ? ?------------------------------------------------------------------------------------------------------------------------------------------------------------------------------------------------------------------- ?Assessment and Plan ? ?SBO (small bowel obstruction) (HCC) ?Improved since hospitalization.  He has added miralax and is pushing fluids. He remains on a soft diet and is advancing as tolerated.  He is limiting roughage for now.  He is instructed to contact the clinic if symptoms worsen.  May need to consider decrease or discontinuation in GLP-1 if he continues to have significant constipation.  Rechecking potassium and magnesium today.  ? ? ?No orders of the defined types were placed in this encounter. ? ? ?No follow-ups on file. ? ? ? ?This visit occurred during the SARS-CoV-2 public health emergency.  Safety protocols were in place, including screening questions prior to the visit, additional usage of staff PPE, and extensive cleaning of  exam room while observing appropriate contact time as indicated for disinfecting solutions.  ? ?

## 2022-01-09 NOTE — Patient Instructions (Signed)
Bowel Obstruction A bowel obstruction is a blockage in the small or large bowel. The bowel is also called the intestine. It is a long tube that connects the stomach to the anus. When a person eats and drinks, food and fluids go from the mouth to the stomach to the small bowel. This is where most of the nutrients in the food and fluids are absorbed. After the small bowel, material passes through the large bowel for further absorption until any leftover material leaves the body as stool (feces) through the anus during a bowel movement. A bowel obstruction will prevent food and fluids from passing through the bowel as they normally do during digestion. The bowel can become partially or completely blocked. If this condition is not treated, it can be dangerous because the bowel could rupture. What are the causes? Common causes of this condition include: Scar tissue in the body (adhesions) from previous surgery or treatment with high-energy X-rays (radiation). Recent surgery. This may cause the movements of the bowel to slow down and cause food to block the intestine. Inflammatory bowel disease, such as Crohn's disease or diverticulitis. Growths or tumors. A bulging organ or tissue (hernia). Twisting of the bowel (volvulus). A swallowed object (foreign body). Slipping of a part of the bowel into another part (intussusception). What are the signs or symptoms? Symptoms of this condition include: Pain in the abdomen. Depending on the degree of obstruction, pain may be: Mild or severe. Dull cramping or sharp pain. In one area or in the entire abdomen. Nausea and vomiting. Vomit may be greenish or a yellow bile color. Bloating in the abdomen. Constipation. Being unable to pass gas. Frequent belching. Diarrhea. This may occur if the obstruction is partial and runny stool is able to leak around the obstruction. How is this diagnosed? This condition may be diagnosed based on: A physical exam. Your  medical history. Exams to look into the small intestine or the large intestine (endoscopy or colonoscopy). Imaging tests of the abdomen or pelvis, such as X-ray or CT scan. Blood or urine tests. How is this treated? Treatment for this condition depends on the cause and severity of the problem. Treatment may include: Fluids and pain medicines that are given through an IV. Your health care provider may tell you not to eat or drink if you have nausea or vomiting. A clear liquid diet. You may be asked to consume a clear liquid diet for several days. This allows the bowel to rest. Placement of a small tube (nasogastric tube) through the nose, down the throat, and into the stomach. This may relieve pain, discomfort, and nausea by removing blocked air and fluids from the stomach. It can also help the obstruction clear up faster. Surgery. This may be required if other treatments do not work. Surgery may be required for: Bowel obstruction from a hernia. This can be an emergency procedure. Scar tissue that causes frequent or severe obstructions. Follow these instructions at home: Medicines Take over-the-counter and prescription medicines only as told by your health care provider. If you were prescribed an antibiotic medicine, take it as told by your health care provider. Do not stop taking the antibiotic even if you start to feel better. General instructions Follow instructions from your health care provider about eating and drinking restrictions. You may need to avoid solid foods and drink only clear liquids until your condition improves. Return to your normal activities as told by your health care provider. Ask your health care provider what activities   are safe for you. Rest as told by your health care provider. Avoid sitting for a long time without moving. Get up to take short walks every 1-2 hours. This is important to improve blood flow and breathing. Ask for help if you feel weak or unsteady. Keep  all follow-up visits. This is important. How is this prevented? After having a bowel obstruction, you are more likely to have another. You may do the following things to prevent another obstruction: If you have a long-term (chronic) disease, pay attention to your symptoms and contact your health care provider if you have questions or concerns. Avoid becoming constipated. You may need to take these actions to prevent or treat constipation: Drink enough fluid to keep your urine pale yellow. Take over-the-counter or prescription medicines. Eat foods that are high in fiber, such as beans, whole grains, and fresh fruits and vegetables. Limit foods that are high in fat and processed sugars, such as fried or sweet foods. Stay active. Exercise for 30 minutes or more, 5 or more days each week. Ask your health care provider which exercises are safe for you. Avoid stress. Find ways to reduce stress, such as meditation, exercise, or taking time for activities that relax you. Instead of eating three large meals each day, eat three small meals with three small snacks. Work with a dietitian to make a healthy meal plan that works for you. Do not use any products that contain nicotine or tobacco. These products include cigarettes, chewing tobacco, and vaping devices, such as e-cigarettes. If you need help quitting, ask your health care provider. Contact a health care provider if: You have a fever. You have chills. Get help right away if: You have increased pain or cramping. You vomit blood. You have uncontrolled vomiting or nausea. You cannot drink fluids because of vomiting or pain. You become confused. You begin feeling very thirsty (dehydrated). You have severe bloating. You feel extremely weak or you faint. Summary A bowel obstruction is a blockage in the small or large bowel. A bowel obstruction will prevent food and fluids from passing through the bowel as they normally do during  digestion. Treatment for this condition depends on the cause and severity of the problem. It may include fluids and pain medicines through an IV, a simple diet, a nasogastric tube, or surgery. Follow instructions from your health care provider about eating restrictions. You may need to avoid solid foods and consume only clear liquids until your condition improves. This information is not intended to replace advice given to you by your health care provider. Make sure you discuss any questions you have with your health care provider. Document Revised: 09/24/2020 Document Reviewed: 09/24/2020 Elsevier Patient Education  2023 [employer]  

## 2022-01-10 ENCOUNTER — Other Ambulatory Visit (HOSPITAL_BASED_OUTPATIENT_CLINIC_OR_DEPARTMENT_OTHER): Payer: Self-pay

## 2022-01-10 ENCOUNTER — Ambulatory Visit: Payer: No Typology Code available for payment source | Admitting: Endocrinology

## 2022-01-10 LAB — CBC WITH DIFFERENTIAL/PLATELET
Absolute Monocytes: 418 cells/uL (ref 200–950)
Basophils Absolute: 42 cells/uL (ref 0–200)
Basophils Relative: 0.9 %
Eosinophils Absolute: 108 cells/uL (ref 15–500)
Eosinophils Relative: 2.3 %
HCT: 45.8 % (ref 38.5–50.0)
Hemoglobin: 15.1 g/dL (ref 13.2–17.1)
Lymphs Abs: 959 cells/uL (ref 850–3900)
MCH: 31 pg (ref 27.0–33.0)
MCHC: 33 g/dL (ref 32.0–36.0)
MCV: 94 fL (ref 80.0–100.0)
MPV: 11.1 fL (ref 7.5–12.5)
Monocytes Relative: 8.9 %
Neutro Abs: 3173 cells/uL (ref 1500–7800)
Neutrophils Relative %: 67.5 %
Platelets: 209 10*3/uL (ref 140–400)
RBC: 4.87 10*6/uL (ref 4.20–5.80)
RDW: 15 % (ref 11.0–15.0)
Total Lymphocyte: 20.4 %
WBC: 4.7 10*3/uL (ref 3.8–10.8)

## 2022-01-10 LAB — COMPLETE METABOLIC PANEL WITH GFR
AG Ratio: 1.7 (calc) (ref 1.0–2.5)
ALT: 20 U/L (ref 9–46)
AST: 27 U/L (ref 10–35)
Albumin: 4.3 g/dL (ref 3.6–5.1)
Alkaline phosphatase (APISO): 67 U/L (ref 35–144)
BUN: 10 mg/dL (ref 7–25)
CO2: 26 mmol/L (ref 20–32)
Calcium: 9.2 mg/dL (ref 8.6–10.3)
Chloride: 102 mmol/L (ref 98–110)
Creat: 0.8 mg/dL (ref 0.70–1.35)
Globulin: 2.6 g/dL (calc) (ref 1.9–3.7)
Glucose, Bld: 197 mg/dL — ABNORMAL HIGH (ref 65–99)
Potassium: 4.4 mmol/L (ref 3.5–5.3)
Sodium: 138 mmol/L (ref 135–146)
Total Bilirubin: 0.7 mg/dL (ref 0.2–1.2)
Total Protein: 6.9 g/dL (ref 6.1–8.1)
eGFR: 98 mL/min/{1.73_m2} (ref >=60)

## 2022-01-10 LAB — MAGNESIUM: Magnesium: 2.3 mg/dL (ref 1.5–2.5)

## 2022-01-11 ENCOUNTER — Other Ambulatory Visit (HOSPITAL_BASED_OUTPATIENT_CLINIC_OR_DEPARTMENT_OTHER): Payer: Self-pay

## 2022-01-14 ENCOUNTER — Other Ambulatory Visit (HOSPITAL_BASED_OUTPATIENT_CLINIC_OR_DEPARTMENT_OTHER): Payer: Self-pay

## 2022-01-15 ENCOUNTER — Other Ambulatory Visit (HOSPITAL_BASED_OUTPATIENT_CLINIC_OR_DEPARTMENT_OTHER): Payer: Self-pay

## 2022-01-15 ENCOUNTER — Other Ambulatory Visit (HOSPITAL_COMMUNITY): Payer: Self-pay

## 2022-01-15 ENCOUNTER — Telehealth: Payer: Self-pay | Admitting: Pharmacy Technician

## 2022-01-15 NOTE — Telephone Encounter (Signed)
That would be OK.  I like G-Voke, and did not have problems with this in the past.  Just let me know.

## 2022-01-15 NOTE — Telephone Encounter (Signed)
Patient Advocate Encounter   Received notification from CoverMyMeds that prior authorization for Baqsimi is required by his/her insurance [employer].  Per Test Claim: TRIAL OF GLUCAGON EMERGENCY KIT,  GVOKE  OR ZEGALOGUE IN THE PAST 120 DAYS   PA submitted on 01/15/22 Key [id number] Status is pending    [facility] will continue to follow:  Patient Advocate Fax:  [phone]

## 2022-01-16 ENCOUNTER — Other Ambulatory Visit: Payer: Self-pay | Admitting: Internal Medicine

## 2022-01-16 ENCOUNTER — Other Ambulatory Visit (HOSPITAL_COMMUNITY): Payer: Self-pay

## 2022-01-16 ENCOUNTER — Other Ambulatory Visit (HOSPITAL_BASED_OUTPATIENT_CLINIC_OR_DEPARTMENT_OTHER): Payer: Self-pay

## 2022-01-16 NOTE — Telephone Encounter (Signed)
Received notification that the request for prior authorization for Baqsimi One Pack 3MG/DOSE powder has been denied due to patient not having previously tried glucagon emergency kit for injection, Gvoke, or Zegalogue. You have a valid medical reason (a contraindication) why you cannot try these options. Please see Dr Arman Filter and Adrienne's notes below.

## 2022-01-17 ENCOUNTER — Other Ambulatory Visit (HOSPITAL_BASED_OUTPATIENT_CLINIC_OR_DEPARTMENT_OTHER): Payer: Self-pay

## 2022-01-18 ENCOUNTER — Other Ambulatory Visit (HOSPITAL_BASED_OUTPATIENT_CLINIC_OR_DEPARTMENT_OTHER): Payer: Self-pay

## 2022-01-18 ENCOUNTER — Other Ambulatory Visit: Payer: Self-pay | Admitting: Internal Medicine

## 2022-01-18 MED ORDER — GVOKE HYPOPEN 1-PACK 1 MG/0.2ML ~~LOC~~ SOAJ
SUBCUTANEOUS | 99 refills | Status: DC
  Filled 2022-01-18: qty 0.2, 1d supply, fill #0

## 2022-01-18 NOTE — Telephone Encounter (Signed)
I sent the G-voke

## 2022-01-22 ENCOUNTER — Other Ambulatory Visit (HOSPITAL_BASED_OUTPATIENT_CLINIC_OR_DEPARTMENT_OTHER): Payer: Self-pay

## 2022-01-30 ENCOUNTER — Other Ambulatory Visit (HOSPITAL_BASED_OUTPATIENT_CLINIC_OR_DEPARTMENT_OTHER): Payer: Self-pay

## 2022-02-01 ENCOUNTER — Other Ambulatory Visit (HOSPITAL_BASED_OUTPATIENT_CLINIC_OR_DEPARTMENT_OTHER): Payer: Self-pay

## 2022-02-01 ENCOUNTER — Other Ambulatory Visit: Payer: Self-pay | Admitting: Family Medicine

## 2022-02-01 ENCOUNTER — Other Ambulatory Visit: Payer: Self-pay | Admitting: Student in an Organized Health Care Education/Training Program

## 2022-02-01 DIAGNOSIS — Z981 Arthrodesis status: Secondary | ICD-10-CM

## 2022-02-01 DIAGNOSIS — M961 Postlaminectomy syndrome, not elsewhere classified: Secondary | ICD-10-CM

## 2022-02-01 DIAGNOSIS — M47816 Spondylosis without myelopathy or radiculopathy, lumbar region: Secondary | ICD-10-CM

## 2022-02-01 DIAGNOSIS — Z0289 Encounter for other administrative examinations: Secondary | ICD-10-CM

## 2022-02-01 DIAGNOSIS — E611 Iron deficiency: Secondary | ICD-10-CM

## 2022-02-01 DIAGNOSIS — G894 Chronic pain syndrome: Secondary | ICD-10-CM

## 2022-02-01 MED ORDER — BUPROPION HCL ER (XL) 300 MG PO TB24
300.0000 mg | ORAL_TABLET | Freq: Every day | ORAL | 0 refills | Status: DC
  Filled 2022-02-01 – 2022-02-25 (×2): qty 90, 90d supply, fill #0

## 2022-02-01 MED ORDER — ONDANSETRON 4 MG PO TBDP
4.0000 mg | ORAL_TABLET | Freq: Three times a day (TID) | ORAL | 0 refills | Status: DC | PRN
  Filled 2022-02-01: qty 20, 7d supply, fill #0

## 2022-02-01 MED ORDER — PANTOPRAZOLE SODIUM 40 MG PO TBEC
40.0000 mg | DELAYED_RELEASE_TABLET | Freq: Every day | ORAL | 0 refills | Status: DC
  Filled 2022-02-01 – 2022-02-25 (×2): qty 90, 90d supply, fill #0

## 2022-02-01 MED ORDER — TESTOSTERONE 50 MG/5GM (1%) TD GEL: 5.0000 g | Freq: Every day | TRANSDERMAL | 0 refills | Status: DC

## 2022-02-01 MED ORDER — TESTOSTERONE 50 MG/5GM (1%) TD GEL
5.0000 g | Freq: Every day | TRANSDERMAL | 0 refills | Status: DC
Start: 2022-02-01 — End: 2022-02-21
  Filled 2022-02-01 – 2022-02-04 (×2): qty 150, 30d supply, fill #0

## 2022-02-01 MED ORDER — IRON (FERROUS SULFATE) 325 (65 FE) MG PO TABS
325.0000 mg | ORAL_TABLET | Freq: Every day | ORAL | 1 refills | Status: DC
  Filled 2022-02-01: qty 100, 100d supply, fill #0
  Filled 2022-02-25: qty 100, 100d supply, fill #1

## 2022-02-04 ENCOUNTER — Other Ambulatory Visit (HOSPITAL_BASED_OUTPATIENT_CLINIC_OR_DEPARTMENT_OTHER): Payer: Self-pay

## 2022-02-05 ENCOUNTER — Ambulatory Visit
Payer: No Typology Code available for payment source | Attending: Student in an Organized Health Care Education/Training Program | Admitting: Student in an Organized Health Care Education/Training Program

## 2022-02-05 ENCOUNTER — Encounter: Payer: Self-pay | Admitting: Student in an Organized Health Care Education/Training Program

## 2022-02-05 ENCOUNTER — Other Ambulatory Visit (HOSPITAL_BASED_OUTPATIENT_CLINIC_OR_DEPARTMENT_OTHER): Payer: Self-pay

## 2022-02-05 VITALS — BP 104/60 | HR 83 | Temp 97.3°F | Resp 16 | Ht 69.0 in | Wt 185.0 lb

## 2022-02-05 DIAGNOSIS — G959 Disease of spinal cord, unspecified: Secondary | ICD-10-CM | POA: Diagnosis present

## 2022-02-05 DIAGNOSIS — M47816 Spondylosis without myelopathy or radiculopathy, lumbar region: Secondary | ICD-10-CM | POA: Diagnosis present

## 2022-02-05 DIAGNOSIS — G894 Chronic pain syndrome: Secondary | ICD-10-CM

## 2022-02-05 DIAGNOSIS — M961 Postlaminectomy syndrome, not elsewhere classified: Secondary | ICD-10-CM

## 2022-02-05 DIAGNOSIS — Z981 Arthrodesis status: Secondary | ICD-10-CM | POA: Diagnosis present

## 2022-02-05 MED ORDER — TIZANIDINE HCL 4 MG PO TABS
4.0000 mg | ORAL_TABLET | Freq: Three times a day (TID) | ORAL | 3 refills | Status: DC | PRN
  Filled 2022-02-05: qty 90, 20d supply, fill #0
  Filled 2022-02-21: qty 90, 20d supply, fill #1
  Filled 2022-04-28: qty 90, 20d supply, fill #2
  Filled 2022-06-27: qty 90, 20d supply, fill #3

## 2022-02-05 MED ORDER — BUPRENORPHINE 15 MCG/HR TD PTWK
1.0000 | MEDICATED_PATCH | TRANSDERMAL | 0 refills | Status: DC
  Filled 2022-02-05: qty 4, 28d supply, fill #0

## 2022-02-05 MED ORDER — ATORVASTATIN CALCIUM 80 MG PO TABS: 80.0000 mg | ORAL_TABLET | Freq: Every day | ORAL | Status: DC

## 2022-02-05 MED ORDER — BUPRENORPHINE 15 MCG/HR TD PTWK
1.0000 | MEDICATED_PATCH | TRANSDERMAL | 2 refills | Status: DC
  Filled 2022-02-05: qty 4, 28d supply, fill #0
  Filled 2022-02-21 – 2022-03-07 (×3): qty 4, 28d supply, fill #1
  Filled 2022-04-02: qty 4, 28d supply, fill #2

## 2022-02-05 NOTE — Progress Notes (Signed)
Nursing Pain Medication Assessment:  Safety precautions to be maintained throughout the outpatient stay will include: orient to surroundings, keep bed in low position, maintain call bell within reach at all times, provide assistance with transfer out of bed and ambulation.  Medication Inspection Compliance: Brandon [name] did not comply with our request to bring his pills to be counted. He was reminded that bringing the medication bottles, even when empty, is a requirement.  Medication: None brought in. Pill/Patch Count: None available to be counted. Bottle Appearance: No container available. Did not bring bottle(s) to appointment. Filled Date: N/A Last Medication intake:   02/01/2022

## 2022-02-05 NOTE — Patient Instructions (Addendum)
Discuss safety of stopping Plavix with Cardiologist for spinal injections, will need to stop for 7 days, ok to take ASA 81 in interim. Please call to set up injections.  GENERAL RISKS AND COMPLICATIONS  What are the risk, side effects and possible complications? Generally speaking, most procedures are safe.  However, with any procedure there are risks, side effects, and the possibility of complications.  The risks and complications are dependent upon the sites that are lesioned, or the type of nerve block to be performed.  The closer the procedure is to the spine, the more serious the risks are.  Great care is taken when placing the radio frequency needles, block needles or lesioning probes, but sometimes complications can occur. Infection: Any time there is an injection through the skin, there is a risk of infection.  This is why sterile conditions are used for these blocks.  There are four possible types of infection. Localized skin infection. Central Nervous System Infection-This can be in the form of Meningitis, which can be deadly. Epidural Infections-This can be in the form of an epidural abscess, which can cause pressure inside of the spine, causing compression of the spinal cord with subsequent paralysis. This would require an emergency surgery to decompress, and there are no guarantees that the patient would recover from the paralysis. Discitis-This is an infection of the intervertebral discs.  It occurs in about 1% of discography procedures.  It is difficult to treat and it may lead to surgery.        2. Pain: the needles have to go through skin and soft tissues, will cause soreness.       3. Damage to internal structures:  The nerves to be lesioned may be near blood vessels or    other nerves which can be potentially damaged.       4. Bleeding: Bleeding is more common if the patient is taking blood thinners such as  aspirin, Coumadin, Ticiid, Plavix, etc., or if he/she have some genetic  predisposition  such as hemophilia. Bleeding into the spinal canal can cause compression of the spinal  cord with subsequent paralysis.  This would require an emergency surgery to  decompress and there are no guarantees that the patient would recover from the  paralysis.       5. Pneumothorax:  Puncturing of a lung is a possibility, every time a needle is introduced in  the area of the chest or upper back.  Pneumothorax refers to free air around the  collapsed lung(s), inside of the thoracic cavity (chest cavity).  Another two possible  complications related to a similar event would include: Hemothorax and Chylothorax.   These are variations of the Pneumothorax, where instead of air around the collapsed  lung(s), you may have blood or chyle, respectively.       6. Spinal headaches: They may occur with any procedures in the area of the spine.       7. Persistent CSF (Cerebro-Spinal Fluid) leakage: This is a rare problem, but may occur  with prolonged intrathecal or epidural catheters either due to the formation of a fistulous  track or a dural tear.       8. Nerve damage: By working so close to the spinal cord, there is always a possibility of  nerve damage, which could be as serious as a permanent spinal cord injury with  paralysis.       9. Death:  Although rare, severe deadly allergic reactions known as "Anaphylactic  reaction" can occur to any of the medications used.      10. Worsening of the symptoms:  We can always make thing worse.  What are the chances of something like this happening? Chances of any of this occuring are extremely low.  By statistics, you have more of a chance of getting killed in a motor vehicle accident: while driving to the hospital than any of the above occurring .  Nevertheless, you should be aware that they are possibilities.  In general, it is similar to taking a shower.  Everybody knows that you can slip, hit your head and get killed.  Does that mean that you should not  shower again?  Nevertheless always keep in mind that statistics do not mean anything if you happen to be on the wrong side of them.  Even if a procedure has a 1 (one) in a 1,000,000 (million) chance of going wrong, it you happen to be that one..Also, keep in mind that by statistics, you have more of a chance of having something go wrong when taking medications.  Who should not have this procedure? If you are on a blood thinning medication (e.g. Coumadin, Plavix, see list of "Blood Thinners"), or if you have an active infection going on, you should not have the procedure.  If you are taking any blood thinners, please inform your physician.  How should I prepare for this procedure? Do not eat or drink anything at least six hours prior to the procedure. Bring a driver with you .  It cannot be a taxi. Come accompanied by an adult that can drive you back, and that is strong enough to help you if your legs get weak or numb from the local anesthetic. Take all of your medicines the morning of the procedure with just enough water to swallow them. If you have diabetes, make sure that you are scheduled to have your procedure done first thing in the morning, whenever possible. If you have diabetes, take only half of your insulin dose and notify our nurse that you have done so as soon as you arrive at the clinic. If you are diabetic, but only take blood sugar pills (oral hypoglycemic), then do not take them on the morning of your procedure.  You may take them after you have had the procedure. Do not take aspirin or any aspirin-containing medications, at least eleven (11) days prior to the procedure.  They may prolong bleeding. Wear loose fitting clothing that may be easy to take off and that you would not mind if it got stained with Betadine or blood. Do not wear any jewelry or perfume Remove any nail coloring.  It will interfere with some of our monitoring equipment.  NOTE: Remember that this is not meant to  be interpreted as a complete list of all possible complications.  Unforeseen problems may occur.  BLOOD THINNERS The following drugs contain aspirin or other products, which can cause increased bleeding during surgery and should not be taken for 2 weeks prior to and 1 week after surgery.  If you should need take something for relief of minor pain, you may take acetaminophen which is found in Tylenol,m Datril, Anacin-3 and Panadol. It is not blood thinner. The products listed below are.  Do not take any of the products listed below in addition to any listed on your instruction sheet.  A.P.C or A.P.C with Codeine Codeine Phosphate Capsules #3 Ibuprofen Ridaura  ABC compound Congesprin Imuran rimadil  Advil Cope  Indocin Robaxisal  Alka-Seltzer Effervescent Pain Reliever and Antacid Coricidin or Coricidin-D  Indomethacin Rufen  Alka-Seltzer plus Cold Medicine Cosprin Ketoprofen S-A-C Tablets  Anacin Analgesic Tablets or Capsules Coumadin Korlgesic Salflex  Anacin Extra Strength Analgesic tablets or capsules CP-2 Tablets Lanoril Salicylate  Anaprox Cuprimine Capsules Levenox Salocol  Anexsia-D Dalteparin Magan Salsalate  Anodynos Darvon compound Magnesium Salicylate Sine-off  Ansaid Dasin Capsules Magsal Sodium Salicylate  Anturane Depen Capsules Marnal Soma  APF Arthritis pain formula Dewitt's Pills Measurin Stanback  Argesic Dia-Gesic Meclofenamic Sulfinpyrazone  Arthritis Bayer Timed Release Aspirin Diclofenac Meclomen Sulindac  Arthritis pain formula Anacin Dicumarol Medipren Supac  Analgesic (Safety coated) Arthralgen Diffunasal Mefanamic Suprofen  Arthritis Strength Bufferin Dihydrocodeine Mepro Compound Suprol  Arthropan liquid Dopirydamole Methcarbomol with Aspirin Synalgos  ASA tablets/Enseals Disalcid Micrainin Tagament  Ascriptin Doan's Midol Talwin  Ascriptin A/D Dolene Mobidin Tanderil  Ascriptin Extra Strength Dolobid Moblgesic Ticlid  Ascriptin with Codeine Doloprin or Doloprin  with Codeine Momentum Tolectin  Asperbuf Duoprin Mono-gesic Trendar  Aspergum Duradyne Motrin or Motrin IB Triminicin  Aspirin plain, buffered or enteric coated Durasal Myochrisine Trigesic  Aspirin Suppositories Easprin Nalfon Trillsate  Aspirin with Codeine Ecotrin Regular or Extra Strength Naprosyn Uracel  Atromid-S Efficin Naproxen Ursinus  Auranofin Capsules Elmiron Neocylate Vanquish  Axotal Emagrin Norgesic Verin  Azathioprine Empirin or Empirin with Codeine Normiflo Vitamin E  Azolid Emprazil Nuprin Voltaren  Bayer Aspirin plain, buffered or children's or timed BC Tablets or powders Encaprin Orgaran Warfarin Sodium  Buff-a-Comp Enoxaparin Orudis Zorpin  Buff-a-Comp with Codeine Equegesic Os-Cal-Gesic   Buffaprin Excedrin plain, buffered or Extra Strength Oxalid   Bufferin Arthritis Strength Feldene Oxphenbutazone   Bufferin plain or Extra Strength Feldene Capsules Oxycodone with Aspirin   Bufferin with Codeine Fenoprofen Fenoprofen Pabalate or Pabalate-SF   Buffets II Flogesic Panagesic   Buffinol plain or Extra Strength Florinal or Florinal with Codeine Panwarfarin   Buf-Tabs Flurbiprofen Penicillamine   Butalbital Compound Four-way cold tablets Penicillin   Butazolidin Fragmin Pepto-Bismol   Carbenicillin Geminisyn Percodan   Carna Arthritis Reliever Geopen Persantine   Carprofen Gold's salt Persistin   Chloramphenicol Goody's Phenylbutazone   Chloromycetin Haltrain Piroxlcam   Clmetidine heparin Plaquenil   Cllnoril Hyco-pap Ponstel   Clofibrate Hydroxy chloroquine Propoxyphen         Before stopping any of these medications, be sure to consult the physician who ordered them.  Some, such as Coumadin (Warfarin) are ordered to prevent or treat serious conditions such as "deep thrombosis", "pumonary embolisms", and other heart problems.  The amount of time that you may need off of the medication may also vary with the medication and the reason for which you were taking it.   If you are taking any of these medications, please make sure you notify your pain physician before you undergo any procedures.         GENERAL RISKS AND COMPLICATIONS  What are the risk, side effects and possible complications? Generally speaking, most procedures are safe.  However, with any procedure there are risks, side effects, and the possibility of complications.  The risks and complications are dependent upon the sites that are lesioned, or the type of nerve block to be performed.  The closer the procedure is to the spine, the more serious the risks are.  Great care is taken when placing the radio frequency needles, block needles or lesioning probes, but sometimes complications can occur. Infection: Any time there is an injection through the skin, there is a risk of  infection.  This is why sterile conditions are used for these blocks.  There are four possible types of infection. Localized skin infection. Central Nervous System Infection-This can be in the form of Meningitis, which can be deadly. Epidural Infections-This can be in the form of an epidural abscess, which can cause pressure inside of the spine, causing compression of the spinal cord with subsequent paralysis. This would require an emergency surgery to decompress, and there are no guarantees that the patient would recover from the paralysis. Discitis-This is an infection of the intervertebral discs.  It occurs in about 1% of discography procedures.  It is difficult to treat and it may lead to surgery.        2. Pain: the needles have to go through skin and soft tissues, will cause soreness.       3. Damage to internal structures:  The nerves to be lesioned may be near blood vessels or    other nerves which can be potentially damaged.       4. Bleeding: Bleeding is more common if the patient is taking blood thinners such as  aspirin, Coumadin, Ticiid, Plavix, etc., or if he/she have some genetic predisposition  such as  hemophilia. Bleeding into the spinal canal can cause compression of the spinal  cord with subsequent paralysis.  This would require an emergency surgery to  decompress and there are no guarantees that the patient would recover from the  paralysis.       5. Pneumothorax:  Puncturing of a lung is a possibility, every time a needle is introduced in  the area of the chest or upper back.  Pneumothorax refers to free air around the  collapsed lung(s), inside of the thoracic cavity (chest cavity).  Another two possible  complications related to a similar event would include: Hemothorax and Chylothorax.   These are variations of the Pneumothorax, where instead of air around the collapsed  lung(s), you may have blood or chyle, respectively.       6. Spinal headaches: They may occur with any procedures in the area of the spine.       7. Persistent CSF (Cerebro-Spinal Fluid) leakage: This is a rare problem, but may occur  with prolonged intrathecal or epidural catheters either due to the formation of a fistulous  track or a dural tear.       8. Nerve damage: By working so close to the spinal cord, there is always a possibility of  nerve damage, which could be as serious as a permanent spinal cord injury with  paralysis.       9. Death:  Although rare, severe deadly allergic reactions known as "Anaphylactic  reaction" can occur to any of the medications used.      10. Worsening of the symptoms:  We can always make thing worse.  What are the chances of something like this happening? Chances of any of this occuring are extremely low.  By statistics, you have more of a chance of getting killed in a motor vehicle accident: while driving to the hospital than any of the above occurring .  Nevertheless, you should be aware that they are possibilities.  In general, it is similar to taking a shower.  Everybody knows that you can slip, hit your head and get killed.  Does that mean that you should not shower again?  Nevertheless  always keep in mind that statistics do not mean anything if you happen to be on the wrong side  of them.  Even if a procedure has a 1 (one) in a 1,000,000 (million) chance of going wrong, it you happen to be that one..Also, keep in mind that by statistics, you have more of a chance of having something go wrong when taking medications.  Who should not have this procedure? If you are on a blood thinning medication (e.g. Coumadin, Plavix, see list of "Blood Thinners"), or if you have an active infection going on, you should not have the procedure.  If you are taking any blood thinners, please inform your physician.  How should I prepare for this procedure? Do not eat or drink anything at least six hours prior to the procedure. Bring a driver with you .  It cannot be a taxi. Come accompanied by an adult that can drive you back, and that is strong enough to help you if your legs get weak or numb from the local anesthetic. Take all of your medicines the morning of the procedure with just enough water to swallow them. If you have diabetes, make sure that you are scheduled to have your procedure done first thing in the morning, whenever possible. If you have diabetes, take only half of your insulin dose and notify our nurse that you have done so as soon as you arrive at the clinic. If you are diabetic, but only take blood sugar pills (oral hypoglycemic), then do not take them on the morning of your procedure.  You may take them after you have had the procedure. Do not take aspirin or any aspirin-containing medications, at least eleven (11) days prior to the procedure.  They may prolong bleeding. Wear loose fitting clothing that may be easy to take off and that you would not mind if it got stained with Betadine or blood. Do not wear any jewelry or perfume Remove any nail coloring.  It will interfere with some of our monitoring equipment.  NOTE: Remember that this is not meant to be interpreted as a complete  list of all possible complications.  Unforeseen problems may occur.  BLOOD THINNERS The following drugs contain aspirin or other products, which can cause increased bleeding during surgery and should not be taken for 2 weeks prior to and 1 week after surgery.  If you should need take something for relief of minor pain, you may take acetaminophen which is found in Tylenol,m Datril, Anacin-3 and Panadol. It is not blood thinner. The products listed below are.  Do not take any of the products listed below in addition to any listed on your instruction sheet.  A.P.C or A.P.C with Codeine Codeine Phosphate Capsules #3 Ibuprofen Ridaura  ABC compound Congesprin Imuran rimadil  Advil Cope Indocin Robaxisal  Alka-Seltzer Effervescent Pain Reliever and Antacid Coricidin or Coricidin-D  Indomethacin Rufen  Alka-Seltzer plus Cold Medicine Cosprin Ketoprofen S-A-C Tablets  Anacin Analgesic Tablets or Capsules Coumadin Korlgesic Salflex  Anacin Extra Strength Analgesic tablets or capsules CP-2 Tablets Lanoril Salicylate  Anaprox Cuprimine Capsules Levenox Salocol  Anexsia-D Dalteparin Magan Salsalate  Anodynos Darvon compound Magnesium Salicylate Sine-off  Ansaid Dasin Capsules Magsal Sodium Salicylate  Anturane Depen Capsules Marnal Soma  APF Arthritis pain formula Dewitt's Pills Measurin Stanback  Argesic Dia-Gesic Meclofenamic Sulfinpyrazone  Arthritis Bayer Timed Release Aspirin Diclofenac Meclomen Sulindac  Arthritis pain formula Anacin Dicumarol Medipren Supac  Analgesic (Safety coated) Arthralgen Diffunasal Mefanamic Suprofen  Arthritis Strength Bufferin Dihydrocodeine Mepro Compound Suprol  Arthropan liquid Dopirydamole Methcarbomol with Aspirin Synalgos  ASA tablets/Enseals Disalcid Micrainin Tagament  Ascriptin  Doan's Midol Talwin  Ascriptin A/D Dolene Mobidin Tanderil  Ascriptin Extra Strength Dolobid Moblgesic Ticlid  Ascriptin with Codeine Doloprin or Doloprin with Codeine Momentum  Tolectin  Asperbuf Duoprin Mono-gesic Trendar  Aspergum Duradyne Motrin or Motrin IB Triminicin  Aspirin plain, buffered or enteric coated Durasal Myochrisine Trigesic  Aspirin Suppositories Easprin Nalfon Trillsate  Aspirin with Codeine Ecotrin Regular or Extra Strength Naprosyn Uracel  Atromid-S Efficin Naproxen Ursinus  Auranofin Capsules Elmiron Neocylate Vanquish  Axotal Emagrin Norgesic Verin  Azathioprine Empirin or Empirin with Codeine Normiflo Vitamin E  Azolid Emprazil Nuprin Voltaren  Bayer Aspirin plain, buffered or children's or timed BC Tablets or powders Encaprin Orgaran Warfarin Sodium  Buff-a-Comp Enoxaparin Orudis Zorpin  Buff-a-Comp with Codeine Equegesic Os-Cal-Gesic   Buffaprin Excedrin plain, buffered or Extra Strength Oxalid   Bufferin Arthritis Strength Feldene Oxphenbutazone   Bufferin plain or Extra Strength Feldene Capsules Oxycodone with Aspirin   Bufferin with Codeine Fenoprofen Fenoprofen Pabalate or Pabalate-SF   Buffets II Flogesic Panagesic   Buffinol plain or Extra Strength Florinal or Florinal with Codeine Panwarfarin   Buf-Tabs Flurbiprofen Penicillamine   Butalbital Compound Four-way cold tablets Penicillin   Butazolidin Fragmin Pepto-Bismol   Carbenicillin Geminisyn Percodan   Carna Arthritis Reliever Geopen Persantine   Carprofen Gold's salt Persistin   Chloramphenicol Goody's Phenylbutazone   Chloromycetin Haltrain Piroxlcam   Clmetidine heparin Plaquenil   Cllnoril Hyco-pap Ponstel   Clofibrate Hydroxy chloroquine Propoxyphen         Before stopping any of these medications, be sure to consult the physician who ordered them.  Some, such as Coumadin (Warfarin) are ordered to prevent or treat serious conditions such as "deep thrombosis", "pumonary embolisms", and other heart problems.  The amount of time that you may need off of the medication may also vary with the medication and the reason for which you were taking it.  If you are taking any  of these medications, please make sure you notify your pain physician before you undergo any procedures.         Facet Blocks Patient Information  Description: The facets are joints in the spine between the vertebrae.  Like any joints in the body, facets can become irritated and painful.  Arthritis can also effect the facets.  By injecting steroids and local anesthetic in and around these joints, we can temporarily block the nerve supply to them.  Steroids act directly on irritated nerves and tissues to reduce selling and inflammation which often leads to decreased pain.  Facet blocks may be done anywhere along the spine from the neck to the low back depending upon the location of your pain.   After numbing the skin with local anesthetic (like Novocaine), a small needle is passed onto the facet joints under x-ray guidance.  You may experience a sensation of pressure while this is being done.  The entire block usually lasts about 15-25 minutes.   Conditions which may be treated by facet blocks:  Low back/buttock pain Neck/shoulder pain Certain types of headaches  Preparation for the injection:  Do not eat any solid food or dairy products within 8 hours of your appointment. You may drink clear liquid up to 3 hours before appointment.  Clear liquids include water, black coffee, juice or soda.  No milk or cream please. You may take your regular medication, including pain medications, with a sip of water before your appointment.  Diabetics should hold regular insulin (if taken separately) and take 1/2 normal NPH dose the  morning of the procedure.  Carry some sugar containing items with you to your appointment. A driver must accompany you and be prepared to drive you home after your procedure. Bring all your current medications with you. An IV may be inserted and sedation may be given at the discretion of the physician. A blood pressure cuff, EKG and other monitors will often be applied during  the procedure.  Some patients may need to have extra oxygen administered for a short period. You will be asked to provide medical information, including your allergies and medications, prior to the procedure.  We must know immediately if you are taking blood thinners (like Coumadin/Warfarin) or if you are allergic to IV iodine contrast (dye).  We must know if you could possible be pregnant.  Possible side-effects:  Bleeding from needle site Infection (rare, may require surgery) Nerve injury (rare) Numbness & tingling (temporary) Difficulty urinating (rare, temporary) Spinal headache (a headache worse with upright posture) Light-headedness (temporary) Pain at injection site (serveral days) Decreased blood pressure (rare, temporary) Weakness in arm/leg (temporary) Pressure sensation in back/neck (temporary)   Call if you experience:  Fever/chills associated with headache or increased back/neck pain Headache worsened by an upright position New onset, weakness or numbness of an extremity below the injection site Hives or difficulty breathing (go to the emergency room) Inflammation or drainage at the injection site(s) Severe back/neck pain greater than usual New symptoms which are concerning to you  Please note:  Although the local anesthetic injected can often make your back or neck feel good for several hours after the injection, the pain will likely return. It takes 3-7 days for steroids to work.  You may not notice any pain relief for at least one week.  If effective, we will often do a series of 2-3 injections spaced 3-6 weeks apart to maximally decrease your pain.  After the initial series, you may be a candidate for a more permanent nerve block of the facets.  If you have any questions, please call #[phone] [facility]

## 2022-02-05 NOTE — Progress Notes (Signed)
PROVIDER NOTE: Information contained herein reflects review and annotations entered in association with encounter. Interpretation of such information and data should be left to medically-trained personnel. Information provided to patient can be located elsewhere in the medical record under "Patient Instructions". Document created using STT-dictation technology, any transcriptional errors that may result from process are unintentional.    Patient: Brandon [name]  Service Category: E/M  Provider: Gillis Santa, MD  DOB: [birth year]  DOS: 02/05/2022  Specialty: Interventional Pain Management  MRN: [id number]  Setting: Ambulatory outpatient  PCP: Luetta Nutting, DO  Type: Established Patient    Referring Provider: Luetta Nutting, DO  Location: Office  Delivery: Face-to-face     HPI  Mr. THORVALD ORSINO, a 66 y.o. year old male, is here today because of his Lumbar facet arthropathy [M47.816]. Mr. Minix primary complain today is Back Pain (lower) Last encounter: My last encounter with him was on 02/01/2022. Pertinent problems: Mr. Mcsweeney has Type 2 diabetes mellitus with other specified complication ([place]); Lumbar facet arthropathy; Coronary artery disease; Acute pain of left shoulder; Sacroiliac joint disease; Chronic pain syndrome; History of lumbar fusion (L4/5); Failed back surgical syndrome; and Pain management contract signed on their pertinent problem list. Pain Assessment: Severity of Chronic pain is reported as a 4 /10. Location: Back Lower/denies. Onset: More than a month ago. Quality: Stabbing. Timing: Constant. Modifying factor(s): meds. Vitals:  height is '5\' 9"'  (1.753 m) and weight is 185 lb (83.9 kg). His temporal temperature is 97.3 F (36.3 C) (abnormal). His blood pressure is 104/60 and his pulse is 83. His respiration is 16 and oxygen saturation is 98%.   Reason for encounter: medication management.  Pharoah presents today for medication management.  His last clinic visit with me was on  October 11, 2021.  Since then he was hospitalized for a small bowel obstruction that was managed medically, nonsurgically.  He states that he has passed his 1 year anniversary for which he received cardiac stents.  He is following up with his cardiologist next week.  If he is able to get clearance to stop Plavix for 7 days, we have discussed doing a diagnostic lumbar facet medial branch nerve block bilaterally at L3, L4, L5 for his low back pain.  Based on our ASRA guidelines, he needs to be off of Plavix for 7 days and supplement with aspirin 81 g.  Otherwise I will refill his Butrans patch below.  HPI from 10/11/2021  Given his cardiac history and history of drug-eluting stents, patient did not get clearance to stop his dual antiplatelet therapy for his spinal injections they will have to wait until July to reassess.  Patient is indeed high risk to be off of his Plavix.  For this reason we will hold off on any spinal injections.  In the interim, we discussed increasing his buprenorphine transdermal patch from 15 mcg an hour to 20 mcg an hour.  I will see the patient back in June to see how he is doing and to hopefully schedule bilateral L3, L5 medial branch nerve blocks for lumbar facet arthropathy and spondylosis.  I will have him sign pain contract today.   New pt visit: 10/21/21  Durant is a pleasant 66 year old male who presents with a chief complaint of low back pain with radiation into his right hip and right buttock.  Bunyan recently moved from [place] with his wife where he was for the last 6 years.  He was seeing pain management there.  He has a  history of L4-L5 decompression and fusion for disc herniations that were resulting in leg pain.  He has had multiple spinal injections including epidural steroid injections as well as a spinal cord stimulator that was implanted in [place] by his pain physician.  He has a Nevro spinal cord stimulator in place.  He states that he is experiencing not as great  analgesic benefit with the stimulator as he was in [place].  He has difficulty walking for extended period of time without having pain in his lower back that radiates into his buttock region.  He is status post bilateral SI joint injection with ultrasound guidance which unfortunately was not effective.  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   In regards to medication management, he has failed various neuropathic's including gabapentin, Lyrica, Cymbalta, TCAs.  He takes tizanidine as needed and also finds benefit with Butrans patch at 15 mcg an hour.  He also utilizes a lidocaine patch.  Cardiac history significant for coronary artery disease status post 3 drug-eluting stents with the most recent 1 placed June 2022.  He is being followed by cardiology.  Pharmacotherapy Assessment  Analgesic: Butrans 20 mcg/hr   Monitoring: [place] PMP: PDMP reviewed during this encounter.       Pharmacotherapy: No side-effects or adverse reactions reported. Compliance: No problems identified. Effectiveness: Clinically acceptable.  Rise Patience, RN  02/05/2022 10:18 AM  Sign when Signing Visit Nursing Pain Medication Assessment:  Safety precautions to be maintained throughout the outpatient stay will include: orient to surroundings, keep bed in low position, maintain call bell within reach at all times, provide assistance with transfer out of bed and ambulation.  Medication Inspection Compliance: Mr. Pitter did not comply with our request to bring his pills to be counted. He was reminded that bringing the medication bottles, even when empty, is a requirement.  Medication: None brought in. Pill/Patch Count: None available to be counted. Bottle Appearance: No container available. Did not bring bottle(s) to  appointment. Filled Date: N/A Last Medication intake:   02/01/2022    UDS:  Summary  Date Value Ref Range Status  10/02/2021 Note  Final    Comment:    ==================================================================== Compliance Drug Analysis, Ur ==================================================================== Test                             Result       Flag       Units  Drug Present and Declared for Prescription Verification   Buprenorphine                  8            EXPECTED   ng/mg creat   Norbuprenorphine               6            EXPECTED   ng/mg creat    Source of buprenorphine is a scheduled prescription medication.    Norbuprenorphine is an expected metabolite of buprenorphine.    Tizanidine                     PRESENT      EXPECTED   Bupropion  PRESENT      EXPECTED   Hydroxybupropion               PRESENT      EXPECTED    Hydroxybupropion is an expected metabolite of bupropion.    Aripiprazole                   PRESENT      EXPECTED   Metoprolol                     PRESENT      EXPECTED  Drug Present not Declared for Prescription Verification   Acetaminophen                  PRESENT      UNEXPECTED  Drug Absent but Declared for Prescription Verification   Alpha-hydroxytriazolam         Not Detected UNEXPECTED ng/mg creat   Tramadol                       Not Detected UNEXPECTED ng/mg creat   Trazodone                      Not Detected UNEXPECTED   Salicylate                     Not Detected UNEXPECTED    Aspirin, as indicated in the declared medication list, is not always    detected even when used as directed.    Diphenhydramine                Not Detected UNEXPECTED   Lidocaine                      Not Detected UNEXPECTED    Lidocaine, as indicated in the declared medication list, is not    always detected even when used as directed.  ==================================================================== Test                       Result    Flag   Units      Ref Range   Creatinine              99               mg/dL      >=20 ==================================================================== Declared Medications:  The flagging and interpretation on this report are based on the  following declared medications.  Unexpected results may arise from  inaccuracies in the declared medications.   **Note: The testing scope of this panel includes these medications:   Aripiprazole (Abilify)  Bupropion (Wellbutrin)  Diphenhydramine  Metoprolol (Toprol)  Tramadol (Ultram)  Trazodone (Desyrel)  Triazolam (Halcion)   **Note: The testing scope of this panel does not include small to  moderate amounts of these reported medications:   Aspirin  Buprenorphine Patch (BuTrans)  Lidocaine  Tizanidine (Zanaflex)  Topical Lidocaine (Lidoderm)   **Note: The testing scope of this panel does not include the  following reported medications:   Amlodipine (Norvasc)  Atorvastatin (Lipitor)  Clopidogrel (Plavix)  Empagliflozin (Jardiance)  Insulin (Humalog)  Isosorbide (Imdur)  Losartan (Cozaar)  Metoclopramide (Reglan)  Nitroglycerin (Nitrostat)  Nystatin (Mycostatin)  Ondansetron (Zofran)  Pantoprazole (Protonix)  Prednisolone  Semaglutide (Ozempic)  Tamsulosin (Flomax)  Testosterone ==================================================================== For clinical consultation, please call [phone]. ====================================================================      ROS  Constitutional: Denies any fever  or chills Gastrointestinal: No reported hemesis, hematochezia, vomiting, or acute GI distress Musculoskeletal:  low back pain Neurological: No reported episodes of acute onset apraxia, aphasia, dysarthria, agnosia, amnesia, paralysis, loss of coordination, or loss of consciousness  Medication Review  ARIPiprazole, Dexcom G6 Sensor, Glucagon, Iron (Ferrous Sulfate), Semaglutide (2 MG/DOSE), albuterol,  aspirin EC, atorvastatin, b complex vitamins, buPROPion, buprenorphine, clopidogrel, empagliflozin, insulin lispro, isosorbide mononitrate, lidocaine, metoCLOPramide, metoprolol succinate, ondansetron, pantoprazole, tamsulosin, testosterone, and tiZANidine  History Review  Allergy: Mr. Vanlanen is allergic to kiwi extract, other, penicillins, peach [prunus persica], ancef [cefazolin], latex, and levaquin [levofloxacin]. Drug: Mr. Canner  reports that he does not currently use drugs. Alcohol:  reports that he does not currently use alcohol. Tobacco:  reports that he quit smoking about 16 years ago. His smoking use included cigarettes. He has never used smokeless tobacco. Social: Mr. Kohlenberg  reports that he quit smoking about 16 years ago. His smoking use included cigarettes. He has never used smokeless tobacco. He reports that he does not currently use alcohol. He reports that he does not currently use drugs. Medical:  has a past medical history of Anxiety, Basal cell carcinoma (BCC) in situ of skin, Chronic back pain, Colon polyps, Coronary artery disease, Depression, Diabetes mellitus without complication ([place]), HLD (hyperlipidemia), Hypertension, and SBO (small bowel obstruction) ([place]). Surgical: Mr. Hankin  has a past surgical history that includes Back surgery; Eye surgery; Cholecystectomy, laparoscopic; Cataract extraction w/ intraocular lens  implant, bilateral; Lumbar fusion; XI Robotic assisted inguinal hernia repair with mesh; LEFT HEART CATH AND CORONARY ANGIOGRAPHY (N/A, 08/15/2020); Intravascular Ultrasound/IVUS (N/A, 08/15/2020); CORONARY STENT INTERVENTION (N/A, 08/15/2020); Cardiac catheterization; LEFT HEART CATH AND CORONARY ANGIOGRAPHY (N/A, 01/17/2021); and CORONARY STENT INTERVENTION (N/A, 01/17/2021). Family: family history includes Anxiety disorder in his father and mother; COPD in his mother; Cancer in his father; Depression in his father and mother; Other in his father.  Laboratory  Chemistry Profile   Renal Lab Results  Component Value Date   BUN 10 01/09/2022   CREATININE 0.80 17/00/1749   BCR NOT APPLICABLE 44/96/7591   GFRAA >60 04/17/2020   GFRNONAA >60 01/02/2022    Hepatic Lab Results  Component Value Date   AST 27 01/09/2022   ALT 20 01/09/2022   ALBUMIN 3.7 12/30/2021   ALKPHOS 69 12/30/2021   LIPASE 42 12/29/2021    Electrolytes Lab Results  Component Value Date   NA 138 01/09/2022   K 4.4 01/09/2022   CL 102 01/09/2022   CALCIUM 9.2 01/09/2022   MG 2.3 01/09/2022   PHOS 4.1 01/01/2022    Bone Lab Results  Component Value Date   TESTOSTERONE 470 11/22/2021    Inflammation (CRP: Acute Phase) (ESR: Chronic Phase) Lab Results  Component Value Date   CRP 1.5 (H) 08/13/2020   ESRSEDRATE 17 (H) 08/13/2020   LATICACIDVEN 1.0 12/30/2021         Note: Above Lab results reviewed.  Recent Imaging Review  DG Abd 1 View CLINICAL DATA:  Small bowel obstruction. Patient denies abdominal pain. Diarrhea this morning.  EXAM: ABDOMEN - 1 VIEW  COMPARISON:  Abdominal x-rays from yesterday.  FINDINGS: Oral contrast throughout the colon. No residual dilated small bowel loops. No acute osseous abnormality.  IMPRESSION: 1. Resolved partial small bowel obstruction.  Electronically Signed   By: Titus Dubin M.D.   On: 12/31/2021 09:08 Note: Reviewed        Physical Exam  General appearance: Well nourished, well developed, and well hydrated. In no apparent acute  distress Mental status: Alert, oriented x 3 (person, place, & time)       Respiratory: No evidence of acute respiratory distress Eyes: PERLA Vitals: BP 104/60   Pulse 83   Temp (!) 97.3 F (36.3 C) (Temporal)   Resp 16   Ht '5\' 9"'  (1.753 m)   Wt 185 lb (83.9 kg)   SpO2 98%   BMI 27.32 kg/m  BMI: Estimated body mass index is 27.32 kg/m as calculated from the following:   Height as of this encounter: '5\' 9"'  (1.753 m).   Weight as of this encounter: 185 lb (83.9  kg). Ideal: Ideal body weight: 70.7 kg (155 lb 13.8 oz) Adjusted ideal body weight: 76 kg (167 lb 8.3 oz)    Thoracic Spine Area Exam  Skin & Axial Inspection: Well healed scar from previous spine surgery detected Alignment: Symmetrical Functional ROM: Pain restricted ROM Stability: No instability detected Muscle Tone/Strength: Functionally intact. No obvious neuro-muscular anomalies detected. Sensory (Neurological): Neurogenic pain pattern Muscle strength & Tone: No palpable anomalies   Lumbar Spine Area Exam  Skin & Axial Inspection: Well healed scar from previous spine surgery detected Alignment: Symmetrical Functional ROM: Pain restricted ROM affecting both sides Stability: No instability detected Muscle Tone/Strength: Functionally intact. No obvious neuro-muscular anomalies detected. Sensory (Neurological): Dermatomal pain pattern + Lumbar facet extension positive for pain consistent with facet syndrome Palpation: IPG present    Gait & Posture Assessment  Ambulation: Limited Gait: Antalgic Posture: Difficulty standing up straight, due to pain    Lower Extremity Exam      Side: Right lower extremity   Side: Left lower extremity  Stability: No instability observed           Stability: No instability observed          Skin & Extremity Inspection: Skin color, temperature, and hair growth are WNL. No peripheral edema or cyanosis. No masses, redness, swelling, asymmetry, or associated skin lesions. No contractures.   Skin & Extremity Inspection: Skin color, temperature, and hair growth are WNL. No peripheral edema or cyanosis. No masses, redness, swelling, asymmetry, or associated skin lesions. No contractures.  Functional ROM: Pain restricted ROM for hip and knee joints           Functional ROM: Pain restricted ROM for hip and knee joints          Muscle Tone/Strength: Functionally intact. No obvious neuro-muscular anomalies detected.   Muscle Tone/Strength: Functionally intact.  No obvious neuro-muscular anomalies detected.  Sensory (Neurological): Arthropathic arthralgia         Sensory (Neurological): Arthropathic arthralgia        DTR: Patellar: deferred today Achilles: deferred today Plantar: deferred today   DTR: Patellar: deferred today Achilles: deferred today Plantar: deferred today  Palpation: No palpable anomalies   Palpation: No palpable anomalies       Assessment   Diagnosis Status  1. Lumbar facet arthropathy   2. Lumbar spondylosis   3. History of lumbar fusion (L4/5)   4. Failed back surgical syndrome   5. Post laminectomy syndrome   6. Chronic pain syndrome   7. Myelopathy ([place])    Persistent Persistent Controlled    Plan of Care    Mr. TOMY KHIM has a current medication list which includes the following long-term medication(s): albuterol, aripiprazole, bupropion, insulin lispro, iron (ferrous sulfate), isosorbide mononitrate, metoclopramide, metoprolol succinate, pantoprazole, testosterone, atorvastatin, and tizanidine.  Refill Butrans patch and tizanidine below.  Discussed diagnostic lumbar facet medial branch nerve  blocks.  We will need to get cardiac clearance for patient to stop Plavix 7 days prior if he would like to proceed with this.  He has a follow-up appointment with his cardiologist next week.  As needed order placed below for bilateral L3, L4, L5 medial branch nerve blocks with IV Versed as sedation.  Patient instructed to call if he would like to proceed with this.   Pharmacotherapy (Medications Ordered): Meds ordered this encounter  Medications   atorvastatin (LIPITOR) 80 MG tablet    Sig: Take 1 tablet (80 mg total) by mouth daily.   DISCONTD: buprenorphine (BUTRANS) 15 MCG/HR    Sig: Place 1 patch onto the skin once a week for 28 days.    Dispense:  4 patch    Refill:  0    Chronic Pain: STOP Act (Not applicable) Fill 1 day early if closed on refill date. Avoid benzodiazepines within 8 hours of opioids    tiZANidine (ZANAFLEX) 4 MG tablet    Sig: Take 1-1&1/2 tablets (4-6 mg total) by mouth every 8 (eight) hours as needed for muscle spasms.    Dispense:  90 tablet    Refill:  3   buprenorphine (BUTRANS) 15 MCG/HR    Sig: Place 1 patch onto the skin once a week.    Dispense:  4 patch    Refill:  2    Chronic Pain: STOP Act (Not applicable) Fill 1 day early if closed on refill date. Avoid benzodiazepines within 8 hours of opioids   Orders:  Orders Placed This Encounter  Procedures   LUMBAR FACET(MEDIAL BRANCH NERVE BLOCK) MBNB    Standing Status:   Standing    Number of Occurrences:   3    Standing Expiration Date:   08/07/2022    Scheduling Instructions:     Procedure: Lumbar facet block (AKA.: Lumbosacral medial branch nerve block)     Side: Bilateral     Level: L3-4, L4-5, & L5-S1 Facets (L3, L4, L5, Medial Branch Nerves)     Sedation: IV Versed     Timeframe: PRN- pt will call to schedule, will need clearance to stop Plavix 7 days prior    Order Specific Question:   Where will this procedure be performed?    Answer:   [facility]   Follow-up plan:   Return in about 3 months (around 05/08/2022) for Medication Management, in person.    Recent Visits No visits were found meeting these conditions. Showing recent visits within past 90 days and meeting all other requirements Today's Visits Date Type Provider Dept  02/05/22 Office Visit Gillis Santa, MD [facility]  Showing today's visits and meeting all other requirements Future Appointments Date Type Provider Dept  05/02/22 Appointment Gillis Santa, MD [facility]  Showing future appointments within next 90 days and meeting all other requirements  I discussed the assessment and treatment plan with the patient. The patient was provided an opportunity to ask questions and all were answered. The patient agreed with the plan and demonstrated an understanding of the instructions.  Patient advised to  call back or seek an in-person evaluation if the symptoms or condition worsens.  Duration of encounter: 64mnutes.  Note by: BGillis Santa MD Date: 02/05/2022; Time: 11:07 AM

## 2022-02-06 ENCOUNTER — Other Ambulatory Visit (HOSPITAL_BASED_OUTPATIENT_CLINIC_OR_DEPARTMENT_OTHER): Payer: Self-pay

## 2022-02-07 ENCOUNTER — Encounter: Payer: Self-pay | Admitting: Cardiology

## 2022-02-07 ENCOUNTER — Ambulatory Visit (INDEPENDENT_AMBULATORY_CARE_PROVIDER_SITE_OTHER): Payer: No Typology Code available for payment source | Admitting: Cardiology

## 2022-02-07 VITALS — BP 108/66 | HR 78 | Ht 69.0 in | Wt 198.8 lb

## 2022-02-07 DIAGNOSIS — I10 Essential (primary) hypertension: Secondary | ICD-10-CM

## 2022-02-07 DIAGNOSIS — I25118 Atherosclerotic heart disease of native coronary artery with other forms of angina pectoris: Secondary | ICD-10-CM

## 2022-02-07 DIAGNOSIS — G4733 Obstructive sleep apnea (adult) (pediatric): Secondary | ICD-10-CM

## 2022-02-07 NOTE — Addendum Note (Signed)
Addended by: Sharin Grave on: 02/07/2022 01:57 PM   Modules accepted: Orders

## 2022-02-07 NOTE — Patient Instructions (Signed)
Medication Instructions:  The current medical regimen is effective;  continue present plan and medications.  *If you need a refill on your cardiac medications before your next appointment, please call your pharmacy*  You have been referred to Dr Annalee Genta to discuss Inspire.  Follow-Up: At [facility], you and your health needs are our priority.  As part of our continuing mission to provide you with exceptional heart care, we have created designated Provider Care Teams.  These Care Teams include your primary Cardiologist (physician) and Advanced Practice Providers (APPs -  Physician Assistants and Nurse Practitioners) who all work together to provide you with the care you need, when you need it.  We recommend signing up for the patient portal called "MyChart".  Sign up information is provided on this After Visit Summary.  MyChart is used to connect with patients for Virtual Visits (Telemedicine).  Patients are able to view lab/test results, encounter notes, upcoming appointments, etc.  Non-urgent messages can be sent to your provider as well.   To learn more about what you can do with MyChart, go to [email or web address].    Your next appointment:   1 year(s)  The format for your next appointment:   In Person  Provider:  Dr Armanda Magic    Important Information About Sugar

## 2022-02-07 NOTE — Progress Notes (Signed)
Sleep medicine office Note:    Date:  02/07/2022   ID:  Brandon [name], DOB 12-Feb-1956, MRN [id number]  PCP:  Everrett Coombe, DO  Cardiologist:  Meriam Sprague, MD    Referring MD: Everrett Coombe, DO   Chief Complaint  Patient presents with   Sleep Apnea   Hypertension    History of Present Illness:    Brandon [name] is a 66 y.o. male with a hx of CAD, hypertension, diabetes who was referred for sleep study testing by Dr. Shari Prows.  He has a history of obstructive sleep apnea the past but stopped using his CPAP and was referred back for repeat sleep study to get back on CPAP therapy.  He underwent split-night sleep study in June 2022 which revealed severe obstructive sleep apnea with an AHI of 45.7/h and O2 saturations as low as 54% consistent with nocturnal hypoxemia.  He failed CPAP therapy due to ongoing events and underwent BiPAP titration and was placed on auto BiPAP with IPAP max 20 cm H2O, EPAP min 5 cm H2O and pressure support 4 cm H2O  When I saw him last he was struggling with his auto BiPAP device.  He was having problems waking up about 4 hours after starting to sleep because his mask was leaking and he would take it off.  He was having significant problems with the mask really leaking and so we ordered him new PAP supplies including a new nasal cushion with chin strap as he had not changed his cushion out since he got his device.  He is now back for follow-up.  He is still having issues tolerating the mask and wakes up frequently through the night to adjust it and then just takes it off.  He would like referral to ENT for evaluation for [facility] device.    Past Medical History:  Diagnosis Date   Anxiety    Basal cell carcinoma (BCC) in situ of skin    Chronic back pain    has spinal cord stimulator and wears buprenorphine patch   Colon polyps    Coronary artery disease    S/p DES to mRCA in 12/21 // S/p DES to LCx x 2 in 12/2020 // Diff dz in small to mod Dx (not a  good target for PCI); OM1 100 CTO; severe dz in small OM2 and PDA >> Med Rx   Depression    Diabetes mellitus without complication (HCC)    HLD (hyperlipidemia)    Hypertension    SBO (small bowel obstruction) (HCC)     Past Surgical History:  Procedure Laterality Date   BACK SURGERY     CARDIAC CATHETERIZATION     CATARACT EXTRACTION W/ INTRAOCULAR LENS  IMPLANT, BILATERAL     CHOLECYSTECTOMY, LAPAROSCOPIC     CORONARY STENT INTERVENTION N/A 08/15/2020   Procedure: CORONARY STENT INTERVENTION;  Surgeon: Corky Crafts, MD;  Location: [facility] INVASIVE CV LAB;  Service: Cardiovascular;  Laterality: N/A;   CORONARY STENT INTERVENTION N/A 01/17/2021   Procedure: CORONARY STENT INTERVENTION;  Surgeon: Kathleene Hazel, MD;  Location: [facility] INVASIVE CV [facility];  Service: Cardiovascular;  Laterality: N/A;   EYE SURGERY     INTRAVASCULAR ULTRASOUND/IVUS N/A 08/15/2020   Procedure: Intravascular Ultrasound/IVUS;  Surgeon: Corky Crafts, MD;  Location: [facility] INVASIVE CV LAB;  Service: Cardiovascular;  Laterality: N/A;   LEFT HEART CATH AND CORONARY ANGIOGRAPHY N/A 08/15/2020   Procedure: LEFT HEART CATH AND CORONARY ANGIOGRAPHY;  Surgeon: Corky Crafts, MD;  Location: [facility] INVASIVE CV LAB;  Service: Cardiovascular;  Laterality: N/A;   LEFT HEART CATH AND CORONARY ANGIOGRAPHY N/A 01/17/2021   Procedure: LEFT HEART CATH AND CORONARY ANGIOGRAPHY;  Surgeon: Kathleene Hazel, MD;  Location: [facility] INVASIVE CV LAB;  Service: Cardiovascular;  Laterality: N/A;   LUMBAR FUSION     L3-5 with rods   XI ROBOTIC ASSISTED INGUINAL HERNIA REPAIR WITH MESH     x 3 surgeries    Current Medications: Current Meds  Medication Sig   albuterol (VENTOLIN HFA) 108 (90 Base) MCG/ACT inhaler Inhale 2 puffs by mouth  into the lungs every 6 (six) hours as needed for wheezing.   ARIPiprazole (ABILIFY) 10 MG tablet Take 1 tablet (10 mg total) by mouth every morning.   aspirin 81 MG EC tablet Take 81 mg by  mouth every morning.   atorvastatin (LIPITOR) 80 MG tablet Take 1 tablet (80 mg total) by mouth daily.   b complex vitamins capsule Take 1 capsule by mouth daily.   buprenorphine (BUTRANS) 15 MCG/HR Place 1 patch onto the skin once a week.   buPROPion (WELLBUTRIN XL) 300 MG 24 hr tablet Take 1 tablet (300 mg total) by mouth daily.   clopidogrel (PLAVIX) 75 MG tablet TAKE 1 TABLET (75 MG TOTAL) BY MOUTH DAILY WITH BREAKFAST.   Continuous Blood Gluc Sensor (DEXCOM G6 SENSOR) MISC Use as directed. Change sensor every 10 days   empagliflozin (JARDIANCE) 25 MG TABS tablet Take 1 tablet (25 mg total) by mouth daily.   insulin lispro (HUMALOG) 100 UNIT/ML injection For use in pump, total of 60 units per day   Iron, Ferrous Sulfate, 325 (65 Fe) MG TABS Take 1 tablet (325 mg) by mouth daily.   isosorbide mononitrate (IMDUR) 30 MG 24 hr tablet Take 1 tablet (30 mg total) by mouth daily.   lidocaine (LIDODERM) 5 % Apply 1 - 3 patches on to the skin as directed. 12 hours on and 12 hours off.   metoCLOPramide (REGLAN) 10 MG tablet Take 10 mg by mouth daily as needed for nausea or vomiting (upset stomach).   metoprolol succinate (TOPROL XL) 25 MG 24 hr tablet Take 1 tablet (25 mg total) by mouth daily.   ondansetron (ZOFRAN-ODT) 4 MG disintegrating tablet Take 1 tablet (4 mg total) by mouth every 8 (eight) hours as needed for nausea or vomiting.   pantoprazole (PROTONIX) 40 MG tablet Take 1 tablet (40 mg total) by mouth daily.   Semaglutide, 2 MG/DOSE, (OZEMPIC, 2 MG/DOSE,) 8 MG/3ML SOPN Inject 2 mg into the skin once a week.   tamsulosin (FLOMAX) 0.4 MG CAPS capsule Take 1 capsule (0.4 mg total) by mouth daily.   testosterone (ANDROGEL) 50 MG/5GM (1%) GEL Place 5 g (1 packet) onto the shoulders and upper arms daily as directed.   tiZANidine (ZANAFLEX) 4 MG tablet Take 1-1&1/2 tablets (4-6 mg total) by mouth every 8 (eight) hours as needed for muscle spasms.     Allergies:   Kiwi extract, Other, Penicillins,  Peach [prunus persica], Ancef [cefazolin], Latex, and Levaquin [levofloxacin]   Social History   Socioeconomic History   Marital status: Married    Spouse name: Not on file   Number of children: 4   Years of education: 16   Highest education level: Not on file  Occupational History   Occupation: disabled  Tobacco Use   Smoking status: Former    Types: Cigarettes    Quit date: 2007    Years since quitting: 16.4  Smokeless tobacco: Never  Vaping Use   Vaping Use: Never used  Substance and Sexual Activity   Alcohol use: Not Currently   Drug use: Not Currently   Sexual activity: Yes  Other Topics Concern   Not on file  Social History Narrative   Not on file   Social Determinants of Health   Financial Resource Strain: Not on file  Food Insecurity: Not on file  Transportation Needs: Not on file  Physical Activity: Not on file  Stress: Not on file  Social Connections: Not on file     Family History: The patient's family history includes Anxiety disorder in his father and mother; COPD in his mother; Cancer in his father; Depression in his father and mother; Other in his father. There is no history of Diabetes.  ROS:   Please see the history of present illness.    ROS  All other systems reviewed and negative.   EKGs/Labs/Other Studies Reviewed:    The following studies were reviewed today: Split-night sleep study, BiPAP titration and Pap compliance download  EKG:  EKG is not ordered today.    Recent Labs: 01/09/2022: ALT 20; BUN 10; Creat 0.80; Hemoglobin 15.1; Magnesium 2.3; Platelets 209; Potassium 4.4; Sodium 138   Recent Lipid Panel    Component Value Date/Time   CHOL 81 11/01/2021 0334   CHOL 113 10/23/2020 0824   TRIG 110 11/01/2021 0334   HDL 22 (L) 11/01/2021 0334   HDL 45 10/23/2020 0824   CHOLHDL 3.7 11/01/2021 0334   VLDL 22 11/01/2021 0334   LDLCALC 37 11/01/2021 0334   LDLCALC 53 10/23/2020 0824    CHA2DS2-VASc Score =   [ ] .  Therefore, the  patient's annual risk of stroke is   %.        Physical Exam:    VS:  BP 108/66   Pulse 78   Ht 5\' 9"  (1.753 m)   Wt 198 lb 12.8 oz (90.2 kg)   SpO2 91%   BMI 29.36 kg/m     Wt Readings from Last 3 Encounters:  02/07/22 198 lb 12.8 oz (90.2 kg)  02/05/22 185 lb (83.9 kg)  01/09/22 192 lb (87.1 kg)     GEN: Well nourished, well developed in no acute distress HEENT: Normal NECK: No JVD; No carotid bruits LYMPHATICS: No lymphadenopathy CARDIAC:RRR, no murmurs, rubs, gallops RESPIRATORY:  Clear to auscultation without rales, wheezing or rhonchi  ABDOMEN: Soft, non-tender, non-distended MUSCULOSKELETAL:  No edema; No deformity  SKIN: Warm and dry NEUROLOGIC:  Alert and oriented x 3 PSYCHIATRIC:  Normal affect   ASSESSMENT:    1. OSA (obstructive sleep apnea)   2. Primary hypertension    PLAN:    In order of problems listed above:   OSA -  The PAP download performed by his DME was personally reviewed and interpreted by me today and showed an AHI of 15.5 /hr on auto BiPAP with 2 % compliance in using more than 4 hours nightly.   -He is still really struggling with his BiPAP device and absolutely cannot use it at night. -At last office visit we had discussed the inspire device and I think he be a good candidate.   -His AHI on sleep study was greater than 15/h and his BMI is less than 32.   -I will refer him to ENT for evaluation   2. Hypertension -BP is adequately controlled on exam today -Continue prescription drug management Toprol-XL 25 mg daily with as needed refills  Time Spent: 20 minutes total time of encounter, including 10 minutes spent in face-to-face patient care on the date of this encounter. This time includes coordination of care and counseling regarding above mentioned problem list. Remainder of non-face-to-face time involved reviewing chart documents/testing relevant to the patient encounter and documentation in the medical record. I have independently  reviewed documentation from referring provider  Medication Adjustments/Labs and Tests Ordered: Current medicines are reviewed at length with the patient today.  Concerns regarding medicines are outlined above.  No orders of the defined types were placed in this encounter.  No orders of the defined types were placed in this encounter.   Signed, Armanda Magic, MD  02/07/2022 1:35 PM    [facility]

## 2022-02-14 NOTE — Progress Notes (Signed)
Cardiology Office Note:    Date:  02/15/2022   ID:  Brandon [name], DOB 16-Jan-1956, MRN [id number]  PCP:  Everrett Coombe, DO   [facility]  Cardiologist:  Meriam Sprague, MD  Advanced Practice Provider:  No care team member to display Electrophysiologist:  None   :[id number]}   Referring MD: Everrett Coombe, DO     History of Present Illness:    Brandon [name] is a 66 y.o. male with a hx of HTN, HLD, DMII, and CAD s/p recent PCI to RCA who presents to clinic for follow-up.  Patient was admitted to the hospital on 08/11/2020 with chest pain.  CTA of the chest showed no evidence of aortic dissection or aneurysm.  Respiratory panel negative for influenza and Covid.  D-dimer negative.  Troponin negative x2.  TTE 08/24/2020 showed EF 60 to 65%, no significant valve issue.  Given the atypical nature of his symptoms, he underwent coronary CT on 08/14/2020 which showed coronary calcium score of 1201 which placed the patient in 95th percentile for age and sex matched control, diffuse CAD with beadlike diabetic pattern with suspicion for severe stenosis in the mid RCA, ostial D2 and proximal left circumflex artery.  He subsequently underwent cardiac catheterization on 08/15/2020 which showed 90% mid RCA lesion, 75% proximal left circumflex artery, 75% OM 2, 100% OM1 with faint left to left and right-to-left collaterals, 70% RPDA, 75% D2, 30% distal LAD lesion.  The 90% mid RCA lesion was treated successfully with a 4.0 x 38 mm resolute DES.  The small disease in RPDA, OM1, OM2 at the diagonal vessel was managed medically. Postprocedure, he continued to have chest discomfort.  A bedside echocardiogram obtained on 08/15/2020 did not reveal any pericardial effusion, there was no change in LV function.  He was treated with colchicine 0.6 mg twice a day, colchicine was subsequently discontinued due to lack of recurrent symptoms.  Hemoglobin A1c obtained during this admission was 10,  he has been advised to follow-up with his primary care provider.  During last visit on 12/04/20, the patient was having significant DOE in the setting of known significant Lcx disease.He failed antianginal medication up-titration and therefore went for cath on 01/17/21 which revealed severe prox Lcx stenosis, severe mid Lcx stenosis and chronic occlusion of OM1. Proximal RCA stent was patent. Severe disease in PDA (small caliber) unchanged from last cath. He received PCI with DES to prox Lcx and mid Lcx.   He presented to the ED 10/22/21 for fatigue, confusion and malaise in the setting of low blood pressures. Work-up there with nonischemic ECG, normal trop, normal d-dimer, reassuring BNP. CXR without infiltrate. He clinically improved and was discharged home.   He followed-up with Dr. Ashley Royalty the next day. He was doing well and advised to increase fluids. Amlodipine was discontinued.   Was last seen in clinic on 10/2021 where he was feeling much better. Lightheadedness had resolved. Was exercising without issues.  Was admitted 12/2021 for partial SBO that was managed conservatively.  Today, the patient overall feels well. Recovering from recent SBO. No chest pain, SOB, lightheadedness, dizziness or syncope. No nausea or vomiting. Tolerating medications without issues. No orthostatic symptoms. Tolerating PO. He is active and goes to the gym 3x/week with no anginal symptoms.  Planned for back injections and is wondering if it is okay to hold plavix.   The patient denies dyspnea at rest or with exertion, palpitations, PND, orthopnea, or leg swelling. Denies cough, fever,  chills. Denies nausea, vomiting. Denies syncope or presyncope. Denies dizziness or lightheadedness.     Past Medical History:  Diagnosis Date   Anxiety    Basal cell carcinoma (BCC) in situ of skin    Chronic back pain    has spinal cord stimulator and wears buprenorphine patch   Colon polyps    Coronary artery disease     S/p DES to mRCA in 12/21 // S/p DES to LCx x 2 in 12/2020 // Diff dz in small to mod Dx (not a good target for PCI); OM1 100 CTO; severe dz in small OM2 and PDA >> Med Rx   Depression    Diabetes mellitus without complication (HCC)    HLD (hyperlipidemia)    Hypertension    SBO (small bowel obstruction) (HCC)     Past Surgical History:  Procedure Laterality Date   BACK SURGERY     CARDIAC CATHETERIZATION     CATARACT EXTRACTION W/ INTRAOCULAR LENS  IMPLANT, BILATERAL     CHOLECYSTECTOMY, LAPAROSCOPIC     CORONARY STENT INTERVENTION N/A 08/15/2020   Procedure: CORONARY STENT INTERVENTION;  Surgeon: Corky Crafts, MD;  Location: [facility] INVASIVE CV LAB;  Service: Cardiovascular;  Laterality: N/A;   CORONARY STENT INTERVENTION N/A 01/17/2021   Procedure: CORONARY STENT INTERVENTION;  Surgeon: Kathleene Hazel, MD;  Location: [facility] INVASIVE CV LAB;  Service: Cardiovascular;  Laterality: N/A;   EYE SURGERY     INTRAVASCULAR ULTRASOUND/IVUS N/A 08/15/2020   Procedure: Intravascular Ultrasound/IVUS;  Surgeon: Corky Crafts, MD;  Location: [facility] INVASIVE CV LAB;  Service: Cardiovascular;  Laterality: N/A;   LEFT HEART CATH AND CORONARY ANGIOGRAPHY N/A 08/15/2020   Procedure: LEFT HEART CATH AND CORONARY ANGIOGRAPHY;  Surgeon: Corky Crafts, MD;  Location: [facility] INVASIVE CV LAB;  Service: Cardiovascular;  Laterality: N/A;   LEFT HEART CATH AND CORONARY ANGIOGRAPHY N/A 01/17/2021   Procedure: LEFT HEART CATH AND CORONARY ANGIOGRAPHY;  Surgeon: Kathleene Hazel, MD;  Location: [facility] INVASIVE CV LAB;  Service: Cardiovascular;  Laterality: N/A;   LUMBAR FUSION     L3-5 with rods   XI ROBOTIC ASSISTED INGUINAL HERNIA REPAIR WITH MESH     x 3 surgeries    Current Medications: Current Meds  Medication Sig   albuterol (VENTOLIN HFA) 108 (90 Base) MCG/ACT inhaler Inhale 2 puffs by mouth  into the lungs every 6 (six) hours as needed for wheezing.   ARIPiprazole (ABILIFY) 10 MG tablet  Take 1 tablet (10 mg total) by mouth every morning.   aspirin 81 MG EC tablet Take 81 mg by mouth every morning.   atorvastatin (LIPITOR) 80 MG tablet Take 1 tablet (80 mg total) by mouth daily.   b complex vitamins capsule Take 1 capsule by mouth daily.   buprenorphine (BUTRANS) 15 MCG/HR Place 1 patch onto the skin once a week.   buPROPion (WELLBUTRIN XL) 300 MG 24 hr tablet Take 1 tablet (300 mg total) by mouth daily.   clopidogrel (PLAVIX) 75 MG tablet TAKE 1 TABLET (75 MG TOTAL) BY MOUTH DAILY WITH BREAKFAST.   Continuous Blood Gluc Sensor (DEXCOM G6 SENSOR) MISC Use as directed. Change sensor every 10 days   empagliflozin (JARDIANCE) 25 MG TABS tablet Take 1 tablet (25 mg total) by mouth daily.   insulin lispro (HUMALOG) 100 UNIT/ML injection For use in pump, total of 60 units per day   Iron, Ferrous Sulfate, 325 (65 Fe) MG TABS Take 1 tablet (325 mg) by mouth daily.   isosorbide  mononitrate (IMDUR) 30 MG 24 hr tablet Take 1 tablet (30 mg total) by mouth daily.   lidocaine (LIDODERM) 5 % Apply 1 - 3 patches on to the skin as directed. 12 hours on and 12 hours off.   metoCLOPramide (REGLAN) 10 MG tablet Take 10 mg by mouth daily as needed for nausea or vomiting (upset stomach).   metoprolol succinate (TOPROL XL) 25 MG 24 hr tablet Take 1 tablet (25 mg total) by mouth daily.   ondansetron (ZOFRAN-ODT) 4 MG disintegrating tablet Take 1 tablet (4 mg total) by mouth every 8 (eight) hours as needed for nausea or vomiting.   pantoprazole (PROTONIX) 40 MG tablet Take 1 tablet (40 mg total) by mouth daily.   Semaglutide, 2 MG/DOSE, (OZEMPIC, 2 MG/DOSE,) 8 MG/3ML SOPN Inject 2 mg into the skin once a week.   tamsulosin (FLOMAX) 0.4 MG CAPS capsule Take 1 capsule (0.4 mg total) by mouth daily.   testosterone (ANDROGEL) 50 MG/5GM (1%) GEL Place 5 g (1 packet) onto the shoulders and upper arms daily as directed.   tiZANidine (ZANAFLEX) 4 MG tablet Take 1-1&1/2 tablets (4-6 mg total) by mouth every 8  (eight) hours as needed for muscle spasms.     Allergies:   Kiwi extract, Other, Penicillins, Peach [prunus persica], Ancef [cefazolin], Latex, and Levaquin [levofloxacin]   Social History   Socioeconomic History   Marital status: Married    Spouse name: Not on file   Number of children: 4   Years of education: 16   Highest education level: Not on file  Occupational History   Occupation: disabled  Tobacco Use   Smoking status: Former    Types: Cigarettes    Quit date: 2007    Years since quitting: 16.4   Smokeless tobacco: Never  Vaping Use   Vaping Use: Never used  Substance and Sexual Activity   Alcohol use: Not Currently   Drug use: Not Currently   Sexual activity: Yes  Other Topics Concern   Not on file  Social History Narrative   Not on file   Social Determinants of Health   Financial Resource Strain: Not on file  Food Insecurity: Not on file  Transportation Needs: Not on file  Physical Activity: Not on file  Stress: Not on file  Social Connections: Not on file     Family History: The patient's family history includes Anxiety disorder in his father and mother; COPD in his mother; Cancer in his father; Depression in his father and mother; Other in his father. There is no history of Diabetes.  ROS:   Please see the history of present illness. Review of Systems  Constitutional:  Negative for chills and fever.  HENT:  Negative for congestion and sore throat.   Eyes:  Negative for blurred vision and discharge.  Respiratory:  Negative for cough and shortness of breath.   Cardiovascular:  Negative for chest pain, palpitations, orthopnea, claudication, leg swelling and PND.  Gastrointestinal:  Negative for nausea and vomiting.  Genitourinary:  Negative for dysuria and urgency.  Musculoskeletal:  Positive for back pain. Negative for myalgias.  Skin:  Negative for itching and rash.  Neurological:  Negative for dizziness and headaches.  Endo/Heme/Allergies:   Negative for polydipsia. Does not bruise/bleed easily.  Psychiatric/Behavioral:  The patient is not nervous/anxious and does not have insomnia.    All other systems reviewed and are negative.  EKGs/Labs/Other Studies Reviewed:    The following studies were reviewed today: Cath 01/17/21: Left heart cath  01/17/21: 2nd Diag-2 lesion is 75% stenosed. 2nd Diag-1 lesion is 75% stenosed. Dist LAD lesion is 50% stenosed. 2nd Mrg lesion is 75% stenosed. 1st Mrg lesion is 100% stenosed. RPDA lesion is 70% stenosed. Previously placed Mid RCA stent (unknown type) is widely patent. Dist RCA lesion is 50% stenosed. Ost RCA to Prox RCA lesion is 30% stenosed. Mid Cx lesion is 90% stenosed. Ost Cx to Prox Cx lesion is 75% stenosed. A drug-eluting stent was successfully placed using a STENT RESOLUTE ONYX 3.5X15. Post intervention, there is a 0% residual stenosis. A drug-eluting stent was successfully placed using a STENT RESOLUTE ONYX 2.25X8. Post intervention, there is a 0% residual stenosis.   1. Non-obstructive disease in the LAD. Diffuse disease in the small to moderate caliber Diagonal branch, unchanged from last cath and not a good target for PCI.  2. Severe proximal Circumflex stenosis. Severe mid Circumflex stenosis. Chronic occlusion OM1. Severe diffuse disease in the small caliber second OM branch.  4. Patent proximal to mid RCA stent. Severe disease in the small caliber PDA, unchanged from last cath.  5. Successful PTCA/DES x 1 proximal Circumflex 6. Successful PTCA/DES x 1 mid Circumflex   Echo 08/13/2020  1. Left ventricular ejection fraction, by estimation, is 60 to 65%. The  left ventricle has normal function. The left ventricle has no regional  wall motion abnormalities. There is mild left ventricular hypertrophy.  Left ventricular diastolic parameters  are consistent with Grade I diastolic dysfunction (impaired relaxation).   2. Right ventricular systolic function is normal. The  right ventricular  size is normal.   3. The mitral valve is normal in structure. No evidence of mitral valve  regurgitation. No evidence of mitral stenosis.   4. The aortic valve is tricuspid. Aortic valve regurgitation is not  visualized. No aortic stenosis is present.   5. The inferior vena cava is normal in size with greater than 50%  respiratory variability, suggesting right atrial pressure of 3 mmHg.  Cath 08/15/20: Mid RCA lesion is 90% stenosed. Prox Cx lesion is 75% stenosed. 2nd Mrg lesion is 75% stenosed. 1st Mrg lesion is 100% stenosed. Faint right to left and left to left collaterals. RPDA lesion is 70% stenosed. 2nd Diag-1 lesion is 75% stenosed. 2nd Diag-2 lesion is 75% stenosed. Dist LAD lesion is 50% stenosed. A drug-eluting stent was successfully placed using a STENT RESOLUTE ONYX 4.0X38, optimized with IVUS. Post intervention, there is a 0% residual stenosis. LV end diastolic pressure is normal. There is no aortic valve stenosis. 75 cm long sheath needed for support due to tortuosity in the right subclavian to engage the RCA. Left coronary could be engaged easily with short sheath.   Diffuse multivessel disease, but no LAD involvement.     RCA successfully treated.  Small vessel disease noted in the RPDA, OM1 and OM2, second diagonal and apical LAD.  Proximal circumflex would be a potential target for PCI if he had more symptoms.   CTA 08/14/20: 1. Left Main: 0.98. 2. LAD: Proximal: 0.97, distal: 0.87. 3. D1: Proximal: 0.75. 4. LCX: Proximal; 0.94, mid: Occluded. 5. RCA: Proximal: 0.98, distal: 0.77.   IMPRESSION: 1. CT FFR analysis showed significant stenoses in proximal RCA, ostial portion of a large D1 and occluded proximal portion of LCX artery. Left cardiac catheterization is recommended.   FINDINGS: A 100 kV prospective scan was triggered in the descending thoracic aorta at 111 HU's. Axial non-contrast 3 mm slices were carried out through the heart.  The  data set was analyzed on a dedicated work station and scored using the [employer] . Gantry rotation speed was 250 msecs and collimation was .6 mm. 100 mg of PO Metoprolol and 0.8 mg of sl NTG were given. The 3D data set was reconstructed in 5% intervals of the 67-82 % of the R-R cycle. Diastolic phases were analyzed on a dedicated work station using MPR, MIP and VRT modes. The patient received 80 cc of contrast.   Aorta: Normal size. Mild diffuse atherosclerotic plaque and calcifications. No dissection.   Aortic Valve:  Trileaflet.  No calcifications.   Coronary Arteries:  Normal coronary origin.  Right dominance.   RCA is a large dominant artery that gives rise to PDA and PLA. There is mild calcified plaque in the proximal RCA with stenosis 25-49%. Mid RCA has severe diffuse predominantly calcified plaque with a focal stenosis suspicious for > 70%. Distal RCA has moderate calcified plaque prior to bifurcation into PLA and PDA with stenosis 50-69%.   PLA is small with diffuse moderate plaque.   PDA is a medium caliber artery with diffuse moderate plaque in a bead like pattern.   Left main is a large artery that gives rise to LAD and LCX arteries. Left main is a long artery with minimal calcified plaque and stenosis 0-25%.   LAD is a large vessel that gives rise to two diagonal arteries. There is mild diffuse calcified plaque with focal stenoses in the proximal and mid LAD of 25-49%.   D1 is very small.   D2 is a medium caliber artery (2.1 mm) that has severe mixed ostial plaque with lipid rich core and is suspicious for stenosis > 70%.   LCX is a large caliber non-dominant artery that gives rise to two OM branches. Proximal LCX artery has severe non-calcified plaque suspicious for stenosis > 70%, this is followed by a moderate diffuse calcified plaque with stenosis 50-69%. Mid and distal LCX artery has small lumen and mild diffuse plaque.   OM1 is very small and  has severe diffuse plaque.   OM2 is a medium caliber artery with diffuse moderate plaque in a bead like pattern.   Other findings:   Normal pulmonary vein drainage into the left atrium.   Normal left atrial appendage without a thrombus.   Normal size of the pulmonary artery.   IMPRESSION: 1. Coronary calcium score of 1201. This was 95 percentile for age and sex matched control.   2. Normal coronary origin with right dominance.   3. CAD-RADS 4 Severe stenosis. (70-99%). Diffuse CAD in a bead-like diabetic pattern, there is suspicion for severe stenoses in the mid RCA, ostial portion of a large D2 and proximal portion of LCX artery. Additional analysis with CT FFR will be submitted. We will tentatively plan for a left cardiac catheterization for tomorrow.   EKG:  EKG with NSR with HR 87  Recent Labs: 01/09/2022: ALT 20; BUN 10; Creat 0.80; Hemoglobin 15.1; Magnesium 2.3; Platelets 209; Potassium 4.4; Sodium 138  Recent Lipid Panel    Component Value Date/Time   CHOL 81 11/01/2021 0334   CHOL 113 10/23/2020 0824   TRIG 110 11/01/2021 0334   HDL 22 (L) 11/01/2021 0334   HDL 45 10/23/2020 0824   CHOLHDL 3.7 11/01/2021 0334   VLDL 22 11/01/2021 0334   LDLCALC 37 11/01/2021 0334   LDLCALC 53 10/23/2020 0824     Physical Exam:    VS:  BP 108/70   Pulse 87   Ht  5\' 9"  (1.753 m)   Wt 195 lb 9.6 oz (88.7 kg)   SpO2 96%   BMI 28.89 kg/m     Wt Readings from Last 3 Encounters:  02/15/22 195 lb 9.6 oz (88.7 kg)  02/07/22 198 lb 12.8 oz (90.2 kg)  02/05/22 185 lb (83.9 kg)     GEN: Well nourished, well developed in no acute distress HEENT: Normal NECK: No JVD; No carotid bruits CARDIAC: RRR, no murmurs, rubs, gallops RESPIRATORY:  Clear to auscultation without rales, wheezing or rhonchi  ABDOMEN: Soft, non-tender, non-distended MUSCULOSKELETAL:  no edema; No deformity  SKIN: Warm and dry NEUROLOGIC:  Alert and oriented x 3 PSYCHIATRIC:  Normal affect    ASSESSMENT:    1. Coronary artery disease involving native coronary artery of native heart without angina pectoris   2. Primary hypertension   3. Coronary artery disease of native artery of native heart with stable angina pectoris (HCC)   4. Hypertension, unspecified type   5. Mixed hyperlipidemia   6. Diabetes mellitus with coincident hypertension (HCC)      PLAN:    In order of problems listed above:  #Multivessel CAD s/p PCI to RCA and recent PCI x2 to Lcx: Patient with PCI to RCA with residual LCx 75%, 50% LAD,  100% OM1,  75% OM2, 75% D1 and D2 in 07/2020 with recurrent angina now s/p PCI to prox and mid Lcx on 01/17/21. Now with significant improvement of symptoms. Able to exercise without issues. Will continue medical therapy as below. -Continue ASA 81mg  daily -Continue plavix 75mg  daily; okay to hold for 7 days prior to back injections -Continue lipitor 80mg  daily -Continue metop 25mg  XL daily -Continue imdur 30mg  daily -Off losartan due to lightheadedness -Continue nitro as needed for chest pain -If frequency of chest pain increases, can pursue ischemic work-up at that time  #HTN: Well controlled <120/80s with no orthostatic symptoms. -Off amlodipine and losartan due to hypotension -Continue metop 25 and imdur 30mg  daily  #DMII: A1C significantly improved from 10-->6.2 -Continue insulin, jardiance and ozempic  #HLD: LDL at goal 37 in 10/2021. -Continue lipitor 80mg  daily -Goal LDL<70  #Obesity: Significantly improved with BMI 35-->28  -Mediterranean diet -Continue exercise with goal 9min/day -Continue ozempic  #Known OSA not on CPAP: Patient with history of OSA now back on CPAP. -Continue CPAP  Medication Adjustments/Labs and Tests Ordered: Current medicines are reviewed at length with the patient today.  Concerns regarding medicines are outlined above.  Orders Placed This Encounter  Procedures   EKG 12-Lead    No orders of the defined types were  placed in this encounter.    Patient Instructions  Medication Instructions:   Your physician recommends that you continue on your current medications as directed. Please refer to the Current Medication list given to you today.  *If you need a refill on your cardiac medications before your next appointment, please call your pharmacy*   Follow-Up: At [facility], you and your health needs are our priority.  As part of our continuing mission to provide you with exceptional heart care, we have created designated Provider Care Teams.  These Care Teams include your primary Cardiologist (physician) and Advanced Practice Providers (APPs -  Physician Assistants and Nurse Practitioners) who all work together to provide you with the care you need, when you need it.  We recommend signing up for the patient portal called "MyChart".  Sign up information is provided on this After Visit Summary.  MyChart is used to  connect with patients for Virtual Visits (Telemedicine).  Patients are able to view lab/test results, encounter notes, upcoming appointments, etc.  Non-urgent messages can be sent to your provider as well.   To learn more about what you can do with MyChart, go to [email or web address].    Your next appointment:   6 month(s)  The format for your next appointment:   In Person  Provider:   Meriam Sprague, MD {   Important Information About Sugar        I,Mykaella Javier,acting as a scribe for Meriam Sprague, MD.,have documented all relevant documentation on the behalf of Meriam Sprague, MD,as directed by  Meriam Sprague, MD while in the presence of Meriam Sprague, MD.  I, Meriam Sprague, MD, have reviewed all documentation for this visit. The documentation on 02/15/22 for the exam, diagnosis, procedures, and orders are all accurate and complete.   Signed, Meriam Sprague, MD  02/15/2022 10:47 AM    [facility]

## 2022-02-15 ENCOUNTER — Encounter: Payer: Self-pay | Admitting: Cardiology

## 2022-02-15 ENCOUNTER — Ambulatory Visit: Payer: No Typology Code available for payment source | Admitting: Cardiology

## 2022-02-15 ENCOUNTER — Ambulatory Visit (INDEPENDENT_AMBULATORY_CARE_PROVIDER_SITE_OTHER): Payer: No Typology Code available for payment source | Admitting: Cardiology

## 2022-02-15 VITALS — BP 108/70 | HR 87 | Ht 69.0 in | Wt 195.6 lb

## 2022-02-15 DIAGNOSIS — I25118 Atherosclerotic heart disease of native coronary artery with other forms of angina pectoris: Secondary | ICD-10-CM

## 2022-02-15 DIAGNOSIS — I2510 Atherosclerotic heart disease of native coronary artery without angina pectoris: Secondary | ICD-10-CM | POA: Diagnosis not present

## 2022-02-15 DIAGNOSIS — E782 Mixed hyperlipidemia: Secondary | ICD-10-CM | POA: Diagnosis not present

## 2022-02-15 DIAGNOSIS — E119 Type 2 diabetes mellitus without complications: Secondary | ICD-10-CM

## 2022-02-15 DIAGNOSIS — I10 Essential (primary) hypertension: Secondary | ICD-10-CM

## 2022-02-15 NOTE — Patient Instructions (Signed)
Medication Instructions:   Your physician recommends that you continue on your current medications as directed. Please refer to the Current Medication list given to you today.  *If you need a refill on your cardiac medications before your next appointment, please call your pharmacy*   Follow-Up: At [facility], you and your health needs are our priority.  As part of our continuing mission to provide you with exceptional heart care, we have created designated Provider Care Teams.  These Care Teams include your primary Cardiologist (physician) and Advanced Practice Providers (APPs -  Physician Assistants and Nurse Practitioners) who all work together to provide you with the care you need, when you need it.  We recommend signing up for the patient portal called "MyChart".  Sign up information is provided on this After Visit Summary.  MyChart is used to connect with patients for Virtual Visits (Telemedicine).  Patients are able to view lab/test results, encounter notes, upcoming appointments, etc.  Non-urgent messages can be sent to your provider as well.   To learn more about what you can do with MyChart, go to [email or web address].    Your next appointment:   6 month(s)  The format for your next appointment:   In Person  Provider:   Heather E Pemberton, MD {   Important Information About Sugar       

## 2022-02-18 ENCOUNTER — Other Ambulatory Visit (HOSPITAL_BASED_OUTPATIENT_CLINIC_OR_DEPARTMENT_OTHER): Payer: Self-pay

## 2022-02-18 ENCOUNTER — Telehealth: Payer: Self-pay | Admitting: Family Medicine

## 2022-02-18 NOTE — Telephone Encounter (Signed)
Form completed and placed in Brandon [name]'s box

## 2022-02-18 NOTE — Telephone Encounter (Signed)
Patient dropped off Handicap plate paperwork to be completed by his PCP. Patient stated that he sold his car and his plates were confiscated. He stated he needs new plates for his new vehicle. Patient was advised of a 3-5 day turnaround - Paperwork placed in providers box - lmr.

## 2022-02-21 ENCOUNTER — Other Ambulatory Visit: Payer: Self-pay | Admitting: Family Medicine

## 2022-02-21 ENCOUNTER — Other Ambulatory Visit: Payer: Self-pay | Admitting: Student in an Organized Health Care Education/Training Program

## 2022-02-21 ENCOUNTER — Other Ambulatory Visit (HOSPITAL_BASED_OUTPATIENT_CLINIC_OR_DEPARTMENT_OTHER): Payer: Self-pay

## 2022-02-24 MED ORDER — TESTOSTERONE 50 MG/5GM (1%) TD GEL
5.0000 g | Freq: Every day | TRANSDERMAL | 0 refills | Status: DC
  Filled 2022-02-24 – 2022-03-05 (×4): qty 150, 30d supply, fill #0

## 2022-02-25 ENCOUNTER — Other Ambulatory Visit: Payer: Self-pay | Admitting: Family Medicine

## 2022-02-25 ENCOUNTER — Other Ambulatory Visit: Payer: Self-pay | Admitting: Student in an Organized Health Care Education/Training Program

## 2022-02-25 ENCOUNTER — Other Ambulatory Visit (HOSPITAL_BASED_OUTPATIENT_CLINIC_OR_DEPARTMENT_OTHER): Payer: Self-pay

## 2022-02-25 MED ORDER — ONDANSETRON 4 MG PO TBDP
4.0000 mg | ORAL_TABLET | Freq: Three times a day (TID) | ORAL | 0 refills | Status: DC | PRN
  Filled 2022-02-25: qty 20, 7d supply, fill #0

## 2022-02-27 ENCOUNTER — Other Ambulatory Visit (HOSPITAL_BASED_OUTPATIENT_CLINIC_OR_DEPARTMENT_OTHER): Payer: Self-pay

## 2022-03-04 ENCOUNTER — Ambulatory Visit (INDEPENDENT_AMBULATORY_CARE_PROVIDER_SITE_OTHER): Payer: No Typology Code available for payment source | Admitting: Gastroenterology

## 2022-03-04 ENCOUNTER — Encounter: Payer: Self-pay | Admitting: Gastroenterology

## 2022-03-04 ENCOUNTER — Other Ambulatory Visit (HOSPITAL_BASED_OUTPATIENT_CLINIC_OR_DEPARTMENT_OTHER): Payer: Self-pay

## 2022-03-04 VITALS — BP 120/68 | HR 80 | Ht 67.25 in | Wt 192.1 lb

## 2022-03-04 DIAGNOSIS — Z86010 Personal history of colonic polyps: Secondary | ICD-10-CM | POA: Diagnosis not present

## 2022-03-04 DIAGNOSIS — Z800 Family history of malignant neoplasm of digestive organs: Secondary | ICD-10-CM

## 2022-03-04 NOTE — Progress Notes (Signed)
HPI : Brandon [name] is a very pleasant 66 year old male with coronary artery disease status post stent x3, diabetes and questionable family history of colon cancer who is referred to [place] by Dr. Everrett Coombe for colonoscopy.  Patient states that he has had 1 previous colonoscopy which she thinks was about 10 years ago in [place], [place].  He recalls having polyps removed and being recommended to repeat in 5 years. His mother was diagnosed with a cancer abutting her sigmoid colon with an associated abscess.  She underwent surgery, but did not require any chemotherapy or radiation.  The patient states that the tumor was on the "outside" of the colon, but believes that it was colon cancer. The patient has chronic constipation which is managed with stool softeners and MiraLAX.  Otherwise, he denies any chronic GI symptoms.  Specifically, no problems with abdominal pain, diarrhea, blood in the stool.  Also no upper GI symptoms such as frequent heartburn/acid regurgitation, dysphagia, nausea/vomiting or dyspepsia.  He has a history of coronary artery disease, first diagnosed in 2021 and treated with a stent.  He continued to have issues with dyspnea and was found to have progression of his disease and underwent more stenting in May 2022.  Since the stents were placed, he has had no symptoms of chest pressure/pain or dyspnea.  He has also made significant lifestyle changes and has lost 65 pounds in the past 2 years.  He goes to the gym 3 days a week and will do 30 minutes of cardio as well as exercises in the pool.  He saw his cardiologist a few weeks ago and no further evaluation or changes were needed.  He was cleared to hold his Plavix for 7 days prior to possible back injections. His diabetes is also much better controlled now.  His last A1c was 6.5.  He was started on a insulin pump earlier this year (managed by Dr. Elvera Lennox)     From Dr. Devin Going note 02/15/22 #Multivessel CAD s/p PCI to RCA  and recent PCI x2 to Lcx: Patient with PCI to RCA with residual LCx 75%, 50% LAD,  100% OM1,  75% OM2, 75% D1 and D2 in 07/2020 with recurrent angina now s/p PCI to prox and mid Lcx on 01/17/21. Now with significant improvement of symptoms. Able to exercise without issues. Will continue medical therapy as below. -Continue ASA 81mg  daily -Continue plavix 75mg  daily; okay to hold for 7 days prior to back injections -Continue lipitor 80mg  daily -Continue metop 25mg  XL daily -Continue imdur 30mg  daily -Off losartan due to lightheadedness -Continue nitro as needed for chest pain -If frequency of chest pain increases, can pursue ischemic work-up at that time  Past Medical History:  Diagnosis Date   Anxiety    Basal cell carcinoma (BCC) in situ of skin    Chronic back pain    has spinal cord stimulator and wears buprenorphine patch   Colon polyps    Coronary artery disease    S/p DES to mRCA in 12/21 // S/p DES to LCx x 2 in 12/2020 // Diff dz in small to mod Dx (not a good target for PCI); OM1 100 CTO; severe dz in small OM2 and PDA >> Med Rx   Depression    Diabetes mellitus without complication (HCC)    HLD (hyperlipidemia)    Hypertension    SBO (small bowel obstruction) (HCC)      Past Surgical History:  Procedure Laterality Date   BACK SURGERY  CARDIAC CATHETERIZATION     CATARACT EXTRACTION W/ INTRAOCULAR LENS  IMPLANT, BILATERAL     CHOLECYSTECTOMY, LAPAROSCOPIC     CORONARY STENT INTERVENTION N/A 08/15/2020   Procedure: CORONARY STENT INTERVENTION;  Surgeon: Corky Crafts, MD;  Location: [facility] INVASIVE CV LAB;  Service: Cardiovascular;  Laterality: N/A;   CORONARY STENT INTERVENTION N/A 01/17/2021   Procedure: CORONARY STENT INTERVENTION;  Surgeon: Kathleene Hazel, MD;  Location: [facility] INVASIVE CV LAB;  Service: Cardiovascular;  Laterality: N/A;   EYE SURGERY     INTRAVASCULAR ULTRASOUND/IVUS N/A 08/15/2020   Procedure: Intravascular Ultrasound/IVUS;  Surgeon:  Corky Crafts, MD;  Location: [facility] INVASIVE CV LAB;  Service: Cardiovascular;  Laterality: N/A;   LEFT HEART CATH AND CORONARY ANGIOGRAPHY N/A 08/15/2020   Procedure: LEFT HEART CATH AND CORONARY ANGIOGRAPHY;  Surgeon: Corky Crafts, MD;  Location: [facility] INVASIVE CV LAB;  Service: Cardiovascular;  Laterality: N/A;   LEFT HEART CATH AND CORONARY ANGIOGRAPHY N/A 01/17/2021   Procedure: LEFT HEART CATH AND CORONARY ANGIOGRAPHY;  Surgeon: Kathleene Hazel, MD;  Location: [facility] INVASIVE CV LAB;  Service: Cardiovascular;  Laterality: N/A;   LUMBAR FUSION     L3-5 with rods   XI ROBOTIC ASSISTED INGUINAL HERNIA REPAIR WITH MESH     x 3 surgeries   Family History  Problem Relation Age of Onset   COPD Mother    Anxiety disorder Mother    Depression Mother    Colon cancer Mother    Cancer Father    Anxiety disorder Father    Depression Father    Other Father    Diabetes Neg Hx    Social History   Tobacco Use   Smoking status: Former    Types: Cigarettes    Quit date: 2007    Years since quitting: 16.5   Smokeless tobacco: Never  Vaping Use   Vaping Use: Never used  Substance Use Topics   Alcohol use: Not Currently   Drug use: Not Currently   Current Outpatient Medications  Medication Sig Dispense Refill   ARIPiprazole (ABILIFY) 10 MG tablet Take 1 tablet (10 mg total) by mouth every morning. 90 tablet 1   aspirin 81 MG EC tablet Take 81 mg by mouth every morning.     atorvastatin (LIPITOR) 80 MG tablet Take 1 tablet (80 mg total) by mouth daily.     b complex vitamins capsule Take 1 capsule by mouth daily.     buprenorphine (BUTRANS) 15 MCG/HR Place 1 patch onto the skin once a week. 4 patch 2   buPROPion (WELLBUTRIN XL) 300 MG 24 hr tablet Take 1 tablet (300 mg total) by mouth daily. 90 tablet 0   clopidogrel (PLAVIX) 75 MG tablet TAKE 1 TABLET (75 MG TOTAL) BY MOUTH DAILY WITH BREAKFAST. 90 tablet 3   Continuous Blood Gluc Sensor (DEXCOM G6 SENSOR) MISC Use as  directed. Change sensor every 10 days 9 each 3   empagliflozin (JARDIANCE) 25 MG TABS tablet Take 1 tablet (25 mg total) by mouth daily. 90 tablet 3   insulin lispro (HUMALOG) 100 UNIT/ML injection For use in pump, total of 60 units per day 60 mL 3   Iron, Ferrous Sulfate, 325 (65 Fe) MG TABS Take 1 tablet (325 mg) by mouth daily. 100 tablet 1   isosorbide mononitrate (IMDUR) 30 MG 24 hr tablet Take 1 tablet (30 mg total) by mouth daily. 90 tablet 2   lidocaine (LIDODERM) 5 % Apply 1 - 3 patches on to  the skin as directed. 12 hours on and 12 hours off. 90 patch 2   metoCLOPramide (REGLAN) 10 MG tablet Take 10 mg by mouth daily as needed for nausea or vomiting (upset stomach).     metoprolol succinate (TOPROL XL) 25 MG 24 hr tablet Take 1 tablet (25 mg total) by mouth daily. 90 tablet 2   ondansetron (ZOFRAN-ODT) 4 MG disintegrating tablet Take 1 tablet (4 mg total) by mouth every 8 (eight) hours as needed for nausea or vomiting. 20 tablet 0   pantoprazole (PROTONIX) 40 MG tablet Take 1 tablet (40 mg total) by mouth daily. 90 tablet 0   Semaglutide, 2 MG/DOSE, (OZEMPIC, 2 MG/DOSE,) 8 MG/3ML SOPN Inject 2 mg into the skin once a week. 9 mL 3   tamsulosin (FLOMAX) 0.4 MG CAPS capsule Take 1 capsule (0.4 mg total) by mouth daily. 90 capsule 1   testosterone (ANDROGEL) 50 MG/5GM (1%) GEL Place 5 g (1 packet) onto the shoulders and upper arms daily as directed. 150 g 0   tiZANidine (ZANAFLEX) 4 MG tablet Take 1-1&1/2 tablets (4-6 mg total) by mouth every 8 (eight) hours as needed for muscle spasms. 90 tablet 3   albuterol (VENTOLIN HFA) 108 (90 Base) MCG/ACT inhaler Inhale 2 puffs by mouth  into the lungs every 6 (six) hours as needed for wheezing. (Patient not taking: Reported on 03/04/2022) 17 g 11   GVOKE HYPOPEN 1-PACK 1 MG/0.2ML SOAJ Inject 1 mg under skin in case of hypoglycemia (Patient not taking: Reported on 02/15/2022) 0.2 mL PRN   No current facility-administered medications for this visit.    Allergies  Allergen Reactions   Kiwi Extract Swelling    Mouth swelling.    Other Swelling and Other (See Comments)    EGGPLANT. Mouth swelling.   Penicillins Rash    Reaction: unknown   Peach [Prunus Persica] Other (See Comments)    Burns mouth   Ancef [Cefazolin] Rash   Latex Rash   Levaquin [Levofloxacin] Rash     Review of Systems: All systems reviewed and negative except where noted in HPI.    No results found.  Physical Exam: BP 120/68 (BP Location: Left Arm, Patient Position: Sitting, Cuff Size: Normal)   Pulse 80   Ht 5' 7.25" (1.708 m) Comment: height measured without shoes  Wt 192 lb 2 oz (87.1 kg)   BMI 29.87 kg/m  Constitutional: Pleasant,well-developed, Caucasian male in no acute distress. HEENT: Normocephalic and atraumatic. Conjunctivae are normal. No scleral icterus. Neck supple.  Cardiovascular: Normal rate, regular rhythm.  Pulmonary/chest: Effort normal and breath sounds normal. No wheezing, rales or rhonchi. Abdominal: Soft, nondistended, nontender. Bowel sounds active throughout. There are no masses palpable. No hepatomegaly.  Glucometer noted over right lower quadrant, insulin pump port noted over left abdomen Extremities: no edema Neurological: Alert and oriented to person place and time. Skin: Skin is warm and dry. No rashes noted. Psychiatric: Normal mood and affect. Behavior is normal.  CBC    Component Value Date/Time   WBC 4.7 01/09/2022 0000   RBC 4.87 01/09/2022 0000   HGB 15.1 01/09/2022 0000   HGB 12.3 (L) 01/02/2021 1014   HCT 45.8 01/09/2022 0000   HCT 40.4 01/02/2021 1014   PLT 209 01/09/2022 0000   PLT 357 01/02/2021 1014   MCV 94.0 01/09/2022 0000   MCV 80 01/02/2021 1014   MCH 31.0 01/09/2022 0000   MCHC 33.0 01/09/2022 0000   RDW 15.0 01/09/2022 0000   RDW 15.5 (H) 01/02/2021  1014   LYMPHSABS 959 01/09/2022 0000   MONOABS 1.0 12/30/2021 0604   EOSABS 108 01/09/2022 0000   BASOSABS 42 01/09/2022 0000    CMP      Component Value Date/Time   NA 138 01/09/2022 0000   NA 140 01/02/2021 1014   K 4.4 01/09/2022 0000   CL 102 01/09/2022 0000   CO2 26 01/09/2022 0000   GLUCOSE 197 (H) 01/09/2022 0000   BUN 10 01/09/2022 0000   BUN 12 01/02/2021 1014   CREATININE 0.80 01/09/2022 0000   CALCIUM 9.2 01/09/2022 0000   PROT 6.9 01/09/2022 0000   PROT 7.0 10/23/2020 0824   ALBUMIN 3.7 12/30/2021 0604   ALBUMIN 4.5 10/23/2020 0824   AST 27 01/09/2022 0000   ALT 20 01/09/2022 0000   ALKPHOS 69 12/30/2021 0604   BILITOT 0.7 01/09/2022 0000   BILITOT 0.4 10/23/2020 0824   GFRNONAA >60 01/02/2022 0112   GFRAA >60 04/17/2020 0510     ASSESSMENT AND PLAN: 66 year old male with personal history of polyps and questionable family history of colon cancer we overdue for surveillance colonoscopy.  He has chronic constipation, but no other chronic GI symptoms.  He has significant history of coronary artery disease, which appears to be adequately managed with coronary stents and medical management.  He is asymptomatic and is active, exercises regularly without any concerning symptoms.  He has been on aspirin and Plavix for over a year now, and was approved to hold his Plavix for a back injection. The patient is a very reasonable candidate for continued colon cancer screening via colonoscopy.  We will schedule the patient for a routine colonoscopy.  We will send a letter to his cardiologist to make sure there are no objections to him undergoing an elective sedated procedure and holding his Plavix again for his colonoscopy. We will also send a letter to his endocrinologist asking for assistance managing his insulin pump settings while undergoing a bowel prep and colonoscopy. I also asked the patient if he can get the details of the type of cancer that his mother had, as this may influence his surveillance recommendations.  Personal history of colon polyps, questionable family history of colon cancer - Colonoscopy - Hold  Plavix 5 days (letter to cardiology) - Letter to endocrinology regarding changes in insulin pump  The details, risks (including bleeding, perforation, infection, missed lesions, medication reactions and possible hospitalization or surgery if complications occur), benefits, and alternatives to colonoscopy with possible biopsy and possible polypectomy were discussed with the patient and he consents to proceed.   Verdie Barrows E. Tomasa Rand, MD [facility]   CC:  Everrett Coombe, DO

## 2022-03-04 NOTE — Patient Instructions (Signed)
If you are age 65 or older, your body mass index should be between 23-30. Your Body mass index is 29.87 kg/m?. If this is out of the aforementioned range listed, please consider follow up with your Primary Care Provider. ? ?If you are age 64 or younger, your body mass index should be between 19-25. Your Body mass index is 29.87 kg/m?. If this is out of the aformentioned range listed, please consider follow up with your Primary Care Provider.  ? ?You have been scheduled for a colonoscopy. Please follow written instructions given to you at your visit today.  ?Please pick up your prep supplies at the pharmacy within the next 1-3 days. ?If you use inhalers (even only as needed), please bring them with you on the day of your procedure. ? ?The [facility] providers would like to encourage you to use MYCHART to communicate with providers for non-urgent requests or questions.  Due to long hold times on the telephone, sending your provider a message by MYCHART may be a faster and more efficient way to get a response.  Please allow 48 business hours for a response.  Please remember that this is for non-urgent requests.  ? ?It was a pleasure to see you today! ? ?Thank you for trusting me with your gastrointestinal care!   ? ?Scott E.Cunningham,MD  ?

## 2022-03-05 ENCOUNTER — Other Ambulatory Visit (HOSPITAL_BASED_OUTPATIENT_CLINIC_OR_DEPARTMENT_OTHER): Payer: Self-pay

## 2022-03-05 ENCOUNTER — Encounter: Payer: Self-pay | Admitting: Family Medicine

## 2022-03-07 ENCOUNTER — Telehealth: Payer: Self-pay

## 2022-03-07 ENCOUNTER — Other Ambulatory Visit (HOSPITAL_BASED_OUTPATIENT_CLINIC_OR_DEPARTMENT_OTHER): Payer: Self-pay

## 2022-03-07 NOTE — Telephone Encounter (Signed)
Error

## 2022-03-07 NOTE — Telephone Encounter (Signed)
[facility] Pre-operative Risk Assessment     Request for surgical clearance:     Endoscopy Procedure  What type of surgery is being performed?     Colonoscopy  When is this surgery scheduled?     04/18/22  What type of clearance is required ?   Pharmacy  Are there any medications that need to be held prior to surgery and how long? Plavix 5 days  Practice name and name of physician performing surgery?      [facility]  What is your office phone and fax number?      Phone- [phone]  [phone]  Anesthesia type (None, local, MAC, general) ?       MAC

## 2022-03-08 ENCOUNTER — Other Ambulatory Visit (HOSPITAL_BASED_OUTPATIENT_CLINIC_OR_DEPARTMENT_OTHER): Payer: Self-pay

## 2022-03-08 NOTE — Telephone Encounter (Signed)
   Name: Brandon [name]  DOB: February 26, 1956  MRN: [id number]   Primary Cardiologist: Meriam Sprague, MD  Chart reviewed as part of pre-operative protocol coverage.  Brandon [name] was last seen on 02/15/2022 by Dr. Shari Prows.    Pharmacologic clearance was requested for a Plavix 5-day hold prior to procedure.  Based on my review of Dr. Devin Going note it looks like he was cleared for a Plavix hold for back injections less than a month ago.  Per our office protocol, he may hold Plavix x5 days prior to procedure.  Please resume when medically safe to do so.   I will route this recommendation to the requesting party via Epic fax function and remove from pre-op pool. Please call with questions.  Sharlene Dory, PA-C 03/08/2022, 8:11 AM

## 2022-03-12 ENCOUNTER — Other Ambulatory Visit: Payer: Self-pay | Admitting: Endocrinology

## 2022-03-12 ENCOUNTER — Other Ambulatory Visit (HOSPITAL_BASED_OUTPATIENT_CLINIC_OR_DEPARTMENT_OTHER): Payer: Self-pay

## 2022-03-12 MED ORDER — OZEMPIC (2 MG/DOSE) 8 MG/3ML ~~LOC~~ SOPN
2.0000 mg | PEN_INJECTOR | SUBCUTANEOUS | 1 refills | Status: DC
  Filled 2022-03-12 – 2022-03-25 (×2): qty 9, 84d supply, fill #0
  Filled 2022-04-20 – 2022-04-24 (×2): qty 3, 28d supply, fill #0
  Filled 2022-05-20: qty 3, 28d supply, fill #1
  Filled 2022-06-25: qty 3, 28d supply, fill #2
  Filled 2022-07-24: qty 3, 28d supply, fill #3
  Filled 2022-08-21 – 2022-08-23 (×2): qty 3, 28d supply, fill #4
  Filled 2022-09-24: qty 3, 28d supply, fill #5

## 2022-03-13 ENCOUNTER — Encounter: Payer: Self-pay | Admitting: Gastroenterology

## 2022-03-13 ENCOUNTER — Other Ambulatory Visit: Payer: Self-pay

## 2022-03-13 ENCOUNTER — Telehealth: Payer: Self-pay

## 2022-03-13 ENCOUNTER — Other Ambulatory Visit (HOSPITAL_BASED_OUTPATIENT_CLINIC_OR_DEPARTMENT_OTHER): Payer: Self-pay

## 2022-03-13 MED ORDER — NA SULFATE-K SULFATE-MG SULF 17.5-3.13-1.6 GM/177ML PO SOLN
ORAL | 0 refills | Status: DC
  Filled 2022-03-13: qty 354, 1d supply, fill #0

## 2022-03-13 NOTE — Telephone Encounter (Signed)
Called patient and let him know to hold Plavix 5 days prior to his procedure. Patient stated understanding and had no questions at the end of call.

## 2022-03-13 NOTE — Telephone Encounter (Signed)
Brandon [name] [birth year] [id number]   Dear Dr. Elvera Lennox    Dr. Tomasa Rand has scheduled the above individual for a(n) Colonoscopy  at   11:30 am on 04/18/22.  Our records show that this patient is on insulin therapy via an insulin pump.  Our colonoscopy prep protocol requires that:   the patient must be on a clear liquid diet the entire day prior to the procedure date as well as the morning of the procedure  the patient must be NPO for 3 to 4 hours prior to the procedure   the patient must consume Suprep solution to prepare for the procedure.  Please advise [place] of any adjustments that need to be made to the patient's insulin pump therapy prior to the above procedure date.    Please route or fax your response to [phone] Tanaya Dunigan .  If you have any questions, please call me at [phone].  Thank you for your help with this matter.  Sincerely,  @signature @    Physician Recommendation:  _____________________________________________________________________  ______________________________________________________________________  ______________________________________________________________________  ______________________________________________________________________

## 2022-03-14 NOTE — Telephone Encounter (Signed)
Hi Maya, He should continue the same basal rates but only use correction boluses the day before the procedure since he is only on clear liquids and on the morning of the procedure since not eating. When he resumes eating, he should resume mealtime boluses.  Sincerely, Carlus Pavlov MD

## 2022-03-18 NOTE — Telephone Encounter (Signed)
Pt said he is returning your call 

## 2022-03-18 NOTE — Telephone Encounter (Signed)
Left VM for patient to return my call regarding insulin pump instructions.

## 2022-03-18 NOTE — Telephone Encounter (Signed)
Patient notified of insulin pump instructions and had no questions at the end of call.

## 2022-03-25 ENCOUNTER — Other Ambulatory Visit (HOSPITAL_BASED_OUTPATIENT_CLINIC_OR_DEPARTMENT_OTHER): Payer: Self-pay

## 2022-03-25 ENCOUNTER — Ambulatory Visit: Payer: No Typology Code available for payment source | Admitting: Family Medicine

## 2022-03-25 ENCOUNTER — Other Ambulatory Visit: Payer: Self-pay | Admitting: Student in an Organized Health Care Education/Training Program

## 2022-03-25 ENCOUNTER — Other Ambulatory Visit: Payer: Self-pay | Admitting: Family Medicine

## 2022-03-25 MED ORDER — TESTOSTERONE 50 MG/5GM (1%) TD GEL
5.0000 g | Freq: Every day | TRANSDERMAL | 0 refills | Status: DC
  Filled 2022-03-25 – 2022-04-04 (×2): qty 150, 30d supply, fill #0

## 2022-03-25 MED ORDER — ONDANSETRON 4 MG PO TBDP
4.0000 mg | ORAL_TABLET | Freq: Three times a day (TID) | ORAL | 0 refills | Status: DC | PRN
  Filled 2022-03-25: qty 20, 7d supply, fill #0

## 2022-03-26 ENCOUNTER — Encounter: Payer: Self-pay | Admitting: Family Medicine

## 2022-03-26 ENCOUNTER — Ambulatory Visit (INDEPENDENT_AMBULATORY_CARE_PROVIDER_SITE_OTHER): Payer: No Typology Code available for payment source | Admitting: Family Medicine

## 2022-03-26 ENCOUNTER — Other Ambulatory Visit (HOSPITAL_BASED_OUTPATIENT_CLINIC_OR_DEPARTMENT_OTHER): Payer: Self-pay

## 2022-03-26 VITALS — BP 104/68 | HR 81 | Ht 67.25 in | Wt 195.0 lb

## 2022-03-26 DIAGNOSIS — Z125 Encounter for screening for malignant neoplasm of prostate: Secondary | ICD-10-CM

## 2022-03-26 DIAGNOSIS — E291 Testicular hypofunction: Secondary | ICD-10-CM

## 2022-03-26 DIAGNOSIS — Z0000 Encounter for general adult medical examination without abnormal findings: Secondary | ICD-10-CM | POA: Insufficient documentation

## 2022-03-26 DIAGNOSIS — Z794 Long term (current) use of insulin: Secondary | ICD-10-CM | POA: Diagnosis not present

## 2022-03-26 DIAGNOSIS — I10 Essential (primary) hypertension: Secondary | ICD-10-CM | POA: Diagnosis not present

## 2022-03-26 DIAGNOSIS — E785 Hyperlipidemia, unspecified: Secondary | ICD-10-CM

## 2022-03-26 DIAGNOSIS — E1169 Type 2 diabetes mellitus with other specified complication: Secondary | ICD-10-CM

## 2022-03-26 LAB — POCT UA - MICROALBUMIN
Albumin/Creatinine Ratio, Urine, POC: <30
Creatinine, POC: 100 mg/dL
Microalbumin Ur, POC: 30 mg/L

## 2022-03-26 NOTE — Progress Notes (Signed)
Brandon [name] - 66 y.o. male MRN [id number]  Date of birth: 08-20-1956  Subjective Chief Complaint  Patient presents with   Annual Exam    HPI Brandon [name] is a 66 y.o. male here today for annual exam.  Reports that he is doing well.  Continues to see cardiology due to history of CAD with stents.  Also seeing endocrinology for management of T2DM.    He continues to exercise regularly.  Feels like diet remains pretty good.  He denies chest pain with exercise.   He is a former smoker, quit 2007.  He denies EtOH use at this time.   Immunizations are UTD.   Has upcoming colonoscopy.   Review of Systems  Constitutional:  Negative for chills, fever, malaise/fatigue and weight loss.  HENT:  Negative for congestion, ear pain and sore throat.   Eyes:  Negative for blurred vision, double vision and pain.  Respiratory:  Negative for cough and shortness of breath.   Cardiovascular:  Negative for chest pain and palpitations.  Gastrointestinal:  Negative for abdominal pain, blood in stool, constipation, heartburn and nausea.  Genitourinary:  Negative for dysuria and urgency.  Musculoskeletal:  Negative for joint pain and myalgias.  Neurological:  Negative for dizziness and headaches.  Endo/Heme/Allergies:  Does not bruise/bleed easily.  Psychiatric/Behavioral:  Negative for depression. The patient is not nervous/anxious and does not have insomnia.     Allergies  Allergen Reactions   Kiwi Extract Swelling    Mouth swelling.    Other Swelling and Other (See Comments)    EGGPLANT. Mouth swelling.   Penicillins Rash    Reaction: unknown   Ancef [Cefazolin] Rash   Latex Rash   Levaquin [Levofloxacin] Rash   Peach [Prunus Persica] Other (See Comments)    Burns mouth    Past Medical History:  Diagnosis Date   Anxiety    Arthritis    Basal cell carcinoma (BCC) in situ of skin    Chronic back pain    has spinal cord stimulator and wears buprenorphine patch   Colon polyps     Coronary artery disease    S/p DES to mRCA in 12/21 // S/p DES to LCx x 2 in 12/2020 // Diff dz in small to mod Dx (not a good target for PCI); OM1 100 CTO; severe dz in small OM2 and PDA >> Med Rx   Depression    Diabetes mellitus without complication (HCC)    Fibromyalgia    Gallstones    GERD (gastroesophageal reflux disease)    Hepatitis B    HLD (hyperlipidemia)    Hypertension    Osteoarthritis    Pneumonia    SBO (small bowel obstruction) (HCC)    Sleep apnea     Past Surgical History:  Procedure Laterality Date   CARDIAC CATHETERIZATION     CATARACT EXTRACTION W/ INTRAOCULAR LENS  IMPLANT, BILATERAL     CHOLECYSTECTOMY, LAPAROSCOPIC     CORONARY STENT INTERVENTION N/A 08/15/2020   Procedure: CORONARY STENT INTERVENTION;  Surgeon: Corky Crafts, MD;  Location: [facility] IN[facility]ASIVE [facility];  Service: Cardiovascular;  Laterality: N/A;   CORONARY STENT INTERVENTION N/A 01/17/2021   Procedure: CORONARY STENT INTERVENTION;  Surgeon: Kathleene Hazel, MD;  Location: [facility];  Service: Cardiovascular;  Laterality: N/A;   INTRAVASCULAR ULTRASOUND/IVUS N/A 08/15/2020   Procedure: Intravascular Ultrasound/IVUS;  Surgeon: Corky Crafts, MD;  Location: [facility] INVASIVE CV LAB;  Service: Cardiovascular;  Laterality: N/A;   LEFT HEART  CATH AND CORONARY ANGIOGRAPHY N/A 08/15/2020   Procedure: LEFT HEART CATH AND CORONARY ANGIOGRAPHY;  Surgeon: Corky Crafts, MD;  Location: [facility] INVASIVE CV LAB;  Service: Cardiovascular;  Laterality: N/A;   LEFT HEART CATH AND CORONARY ANGIOGRAPHY N/A 01/17/2021   Procedure: LEFT HEART CATH AND CORONARY ANGIOGRAPHY;  Surgeon: Kathleene Hazel, MD;  Location: [facility] INVAS[facility] CV [facility];  Service: Cardiovascular;  Laterality: N/A;   LUMBAR FUSION     L3-5 with rods   MICRODISCECTOMY LUMBAR  2006   SPINAL CORD STIMULATOR INSERTION     blood clot removed from spinal cord   SPINE SURGERY     blood clot removed from spinal cord   XI ROBOTIC  ASSISTED INGUINAL HERNIA REPAIR WITH MESH     x 3 surgeries    Social History   Socioeconomic History   Marital status: Married    Spouse name: Not on file   Number of children: 4   Years of education: 16   Highest education level: Not on file  Occupational History   Occupation: disabled/ respiratory therapist  Tobacco Use   Smoking status: Former    Types: Cigarettes    Quit date: 2007    Years since quitting: 16.5   Smokeless tobacco: Never  Vaping Use   Vaping Use: Never used  Substance and Sexual Activity   Alcohol use: Not Currently   Drug use: Not Currently   Sexual activity: Yes  Other Topics Concern   Not on file  Social History Narrative   Not on file   Social Determinants of Health   Financial Resource Strain: Not on file  Food Insecurity: Not on file  Transportation Needs: Not on file  Physical Activity: Not on file  Stress: Not on file  Social Connections: Not on file    Family History  Problem Relation Age of Onset   COPD Mother    Anxiety disorder Mother    Depression Mother    Colon cancer Mother    Cancer Father        mets, origin unknown   Anxiety disorder Father    Depression Father    Coronary artery disease Father    CAD Brother    Leukemia Maternal Grandmother    Multiple sclerosis Daughter    Diabetes Neg Hx     Health Maintenance  Topic Date Due   Zoster Vaccines- Shingrix (1 of 2) 03/29/2022 (Originally 09/13/2005)   COVID-19 Vaccine (3 - Pfizer series) 06/26/2022 (Originally 08/11/2020)   INFLUENZA VACCINE  11/24/2022 (Originally 03/26/2022)   TETANUS/TDAP  12/28/2022 (Originally 09/13/1974)   COLONOSCOPY (Pts 45-50yrs Insurance coverage will need to be confirmed)  01/10/2023 (Originally 09/13/2000)   Hepatitis C Screening  01/10/2023 (Originally 09/13/1973)   OPHTHALMOLOGY EXAM  04/24/2022   FOOT EXAM  05/29/2022   HEMOGLOBIN A1C  07/10/2022   Diabetic kidney evaluation - GFR measurement  01/10/2023   Diabetic kidney  evaluation - Urine ACR  03/27/2023   Pneumonia Vaccine 59+ Years old  Completed   HPV VACCINES  Aged Out     ----------------------------------------------------------------------------------------------------------------------------------------------------------------------------------------------------------------- Physical Exam BP 104/68 (BP Location: Left Arm, Patient Position: Sitting, Cuff Size: Normal)   Pulse 81   Ht 5' 7.25" (1.708 m)   Wt 195 lb (88.5 kg)   SpO2 97%   BMI 30.31 kg/m   Physical Exam Constitutional:      General: He is not in acute distress. HENT:     Head: Normocephalic and atraumatic.     Right  Ear: Tympanic membrane and external ear normal.     Left Ear: Tympanic membrane and external ear normal.  Eyes:     General: No scleral icterus. Neck:     Thyroid: No thyromegaly.  Cardiovascular:     Rate and Rhythm: Normal rate and regular rhythm.     Heart sounds: Normal heart sounds.  Pulmonary:     Effort: Pulmonary effort is normal.     Breath sounds: Normal breath sounds.  Abdominal:     General: Bowel sounds are normal. There is no distension.     Palpations: Abdomen is soft.     Tenderness: There is no abdominal tenderness. There is no guarding.  Musculoskeletal:     Cervical back: Normal range of motion.  Lymphadenopathy:     Cervical: No cervical adenopathy.  Skin:    General: Skin is warm and dry.     Findings: No rash.  Neurological:     Mental Status: He is alert and oriented to person, place, and time.     Cranial Nerves: No cranial nerve deficit.     Motor: No abnormal muscle tone.  Psychiatric:        Mood and Affect: Mood normal.        Behavior: Behavior normal.     ------------------------------------------------------------------------------------------------------------------------------------------------------------------------------------------------------------------- Assessment and Plan  Well adult exam Well  adult Orders Placed This Encounter  Procedures   COMPLETE METABOLIC PANEL WITH GFR   CBC with Differential   Lipid Panel w/reflex Direct LDL   PSA   Testosterone   POCT UA - Microalbumin  Screening:  Per lab orders.  Has upcoming colonoscopy.  Immunizations: UTD Anticipatory guidance/Risk factor reduction:  Recommendations per AVS.     No orders of the defined types were placed in this encounter.   Return in about 6 months (around 09/26/2022) for HTN.    This visit occurred during the SARS-CoV-2 public health emergency.  Safety protocols were in place, including screening questions prior to the visit, additional usage of staff PPE, and extensive cleaning of exam room while observing appropriate contact time as indicated for disinfecting solutions.

## 2022-03-26 NOTE — Patient Instructions (Signed)
Preventive Care 65 Years and Older, Male Preventive care refers to lifestyle choices and visits with your health care provider that can promote health and wellness. Preventive care visits are also called wellness exams. What can I expect for my preventive care visit? Counseling During your preventive care visit, your health care provider may ask about your: Medical history, including: Past medical problems. Family medical history. History of falls. Current health, including: Emotional well-being. Home life and relationship well-being. Sexual activity. Memory and ability to understand (cognition). Lifestyle, including: Alcohol, nicotine or tobacco, and drug use. Access to firearms. Diet, exercise, and sleep habits. Work and work environment. Sunscreen use. Safety issues such as seatbelt and bike helmet use. Physical exam Your health care provider will check your: Height and weight. These may be used to calculate your BMI (body mass index). BMI is a measurement that tells if you are at a healthy weight. Waist circumference. This measures the distance around your waistline. This measurement also tells if you are at a healthy weight and may help predict your risk of certain diseases, such as type 2 diabetes and high blood pressure. Heart rate and blood pressure. Body temperature. Skin for abnormal spots. What immunizations do I need?  Vaccines are usually given at various ages, according to a schedule. Your health care provider will recommend vaccines for you based on your age, medical history, and lifestyle or other factors, such as travel or where you work. What tests do I need? Screening Your health care provider may recommend screening tests for certain conditions. This may include: Lipid and cholesterol levels. Diabetes screening. This is done by checking your blood sugar (glucose) after you have not eaten for a while (fasting). Hepatitis C test. Hepatitis B test. HIV (human  immunodeficiency virus) test. STI (sexually transmitted infection) testing, if you are at risk. Lung cancer screening. Colorectal cancer screening. Prostate cancer screening. Abdominal aortic aneurysm (AAA) screening. You may need this if you are a current or former smoker. Talk with your health care provider about your test results, treatment options, and if necessary, the need for more tests. Follow these instructions at home: Eating and drinking  Eat a diet that includes fresh fruits and vegetables, whole grains, lean protein, and low-fat dairy products. Limit your intake of foods with high amounts of sugar, saturated fats, and salt. Take vitamin and mineral supplements as recommended by your health care provider. Do not drink alcohol if your health care provider tells you not to drink. If you drink alcohol: Limit how much you have to 0-2 drinks a day. Know how much alcohol is in your drink. In the [place] one drink equals one 12 oz bottle of beer (355 mL), one 5 oz glass of wine (148 mL), or one 1 oz glass of hard liquor (44 mL). Lifestyle Brush your teeth every morning and night with fluoride toothpaste. Floss one time each day. Exercise for at least 30 minutes 5 or more days each week. Do not use any products that contain nicotine or tobacco. These products include cigarettes, chewing tobacco, and vaping devices, such as e-cigarettes. If you need help quitting, ask your health care provider. Do not use drugs. If you are sexually active, practice safe sex. Use a condom or other form of protection to prevent STIs. Take aspirin only as told by your health care provider. Make sure that you understand how much to take and what form to take. Work with your health care provider to find out whether it is safe   and beneficial for you to take aspirin daily. Ask your health care provider if you need to take a cholesterol-lowering medicine (statin). Find healthy ways to manage stress, such  as: Meditation, yoga, or listening to music. Journaling. Talking to a trusted person. Spending time with friends and family. Safety Always wear your seat belt while driving or riding in a vehicle. Do not drive: If you have been drinking alcohol. Do not ride with someone who has been drinking. When you are tired or distracted. While texting. If you have been using any mind-altering substances or drugs. Wear a helmet and other protective equipment during sports activities. If you have firearms in your house, make sure you follow all gun safety procedures. Minimize exposure to UV radiation to reduce your risk of skin cancer. What's next? Visit your health care provider once a year for an annual wellness visit. Ask your health care provider how often you should have your eyes and teeth checked. Stay up to date on all vaccines. This information is not intended to replace advice given to you by your health care provider. Make sure you discuss any questions you have with your health care provider. Document Revised:  Document Reviewed:  Elsevier Patient Education  2023 Elsevier Inc.  

## 2022-03-26 NOTE — Assessment & Plan Note (Signed)
Well adult Orders Placed This Encounter  Procedures  . COMPLETE METABOLIC PANEL WITH GFR  . CBC with Differential  . Lipid Panel w/reflex Direct LDL  . PSA  . Testosterone  . POCT UA - Microalbumin  Screening:  Per lab orders.  Has upcoming colonoscopy.  Immunizations: UTD Anticipatory guidance/Risk factor reduction:  Recommendations per AVS.

## 2022-03-27 LAB — COMPLETE METABOLIC PANEL WITH GFR
AG Ratio: 1.8 (calc) (ref 1.0–2.5)
ALT: 12 U/L (ref 9–46)
AST: 19 U/L (ref 10–35)
Albumin: 4.4 g/dL (ref 3.6–5.1)
Alkaline phosphatase (APISO): 68 U/L (ref 35–144)
BUN: 12 mg/dL (ref 7–25)
CO2: 29 mmol/L (ref 20–32)
Calcium: 9.5 mg/dL (ref 8.6–10.3)
Chloride: 104 mmol/L (ref 98–110)
Creat: 0.82 mg/dL (ref 0.70–1.35)
Globulin: 2.4 g/dL (calc) (ref 1.9–3.7)
Glucose, Bld: 134 mg/dL — ABNORMAL HIGH (ref 65–99)
Potassium: 4.2 mmol/L (ref 3.5–5.3)
Sodium: 141 mmol/L (ref 135–146)
Total Bilirubin: 1 mg/dL (ref 0.2–1.2)
Total Protein: 6.8 g/dL (ref 6.1–8.1)
eGFR: 97 mL/min/{1.73_m2} (ref >=60)

## 2022-03-27 LAB — CBC WITH DIFFERENTIAL/PLATELET
Absolute Monocytes: 450 cells/uL (ref 200–950)
Basophils Absolute: 20 cells/uL (ref 0–200)
Basophils Relative: 0.4 %
Eosinophils Absolute: 60 cells/uL (ref 15–500)
Eosinophils Relative: 1.2 %
HCT: 45.2 % (ref 38.5–50.0)
Hemoglobin: 15.5 g/dL (ref 13.2–17.1)
Lymphs Abs: 1090 cells/uL (ref 850–3900)
MCH: 32.7 pg (ref 27.0–33.0)
MCHC: 34.3 g/dL (ref 32.0–36.0)
MCV: 95.4 fL (ref 80.0–100.0)
MPV: 10.5 fL (ref 7.5–12.5)
Monocytes Relative: 9 %
Neutro Abs: 3380 cells/uL (ref 1500–7800)
Neutrophils Relative %: 67.6 %
Platelets: 191 10*3/uL (ref 140–400)
RBC: 4.74 10*6/uL (ref 4.20–5.80)
RDW: 12.7 % (ref 11.0–15.0)
Total Lymphocyte: 21.8 %
WBC: 5 10*3/uL (ref 3.8–10.8)

## 2022-03-27 LAB — LIPID PANEL W/REFLEX DIRECT LDL
Cholesterol: 105 mg/dL (ref <200)
HDL: 32 mg/dL — ABNORMAL LOW (ref >=40)
LDL Cholesterol (Calc): 43 mg/dL (calc)
Non-HDL Cholesterol (Calc): 73 mg/dL (calc) (ref <130)
Total CHOL/HDL Ratio: 3.3 (calc) (ref <5.0)
Triglycerides: 256 mg/dL — ABNORMAL HIGH (ref <150)

## 2022-03-27 LAB — TESTOSTERONE: Testosterone: 522 ng/dL (ref 250–827)

## 2022-03-27 LAB — PSA: PSA: 0.7 ng/mL (ref <=4.00)

## 2022-04-02 ENCOUNTER — Other Ambulatory Visit (HOSPITAL_BASED_OUTPATIENT_CLINIC_OR_DEPARTMENT_OTHER): Payer: Self-pay

## 2022-04-03 ENCOUNTER — Other Ambulatory Visit (HOSPITAL_BASED_OUTPATIENT_CLINIC_OR_DEPARTMENT_OTHER): Payer: Self-pay

## 2022-04-04 ENCOUNTER — Other Ambulatory Visit (HOSPITAL_BASED_OUTPATIENT_CLINIC_OR_DEPARTMENT_OTHER): Payer: Self-pay

## 2022-04-10 ENCOUNTER — Telehealth: Payer: Self-pay | Admitting: *Deleted

## 2022-04-10 ENCOUNTER — Telehealth: Payer: Self-pay | Admitting: Cardiology

## 2022-04-10 NOTE — Telephone Encounter (Signed)
Patient states he was advised he would be having an inspire evaluation on 8/22 and he called to find out the location. I am unable to see this scheduled. Please assist.

## 2022-04-10 NOTE — Telephone Encounter (Signed)
Ethin, Drummond T - 04/10/2022  2:20 PM Quintella Reichert, MD  Sent: Wed April 10, 2022  4:40 PM  To: Loa Socks, LPN; Reesa Chew, CMA          Message  Will defer this to Coralee North it should have been placed as an order to see Dr. Jenne Pane

## 2022-04-10 NOTE — Telephone Encounter (Signed)
Spoke with pt and he is aware, appt cancelled.  Maya I just wanted to make sure this pt is on the [facility] wait list, please let me know thanks.

## 2022-04-10 NOTE — Telephone Encounter (Signed)
Dr. Tomasa Rand,  This pt is scheduled with you for a procedure on 8/24.  He is a documented difficult intubation and his procedure will need to be done at the hospital.    Thanks,  Cathlyn Parsons

## 2022-04-12 ENCOUNTER — Telehealth: Payer: Self-pay | Admitting: Cardiology

## 2022-04-12 NOTE — Telephone Encounter (Signed)
Pt called stating he was billed for the entire price of the sleep study he did 05/2021. He states his insurance needs [place] to call them back date the prior authorization

## 2022-04-18 ENCOUNTER — Encounter: Payer: No Typology Code available for payment source | Admitting: Gastroenterology

## 2022-04-18 NOTE — Telephone Encounter (Signed)
Patient was suppose to make sure his PCP gave him a referral.  ----- Message from Gaynelle Cage, CMA sent at 02/14/2021  8:54 AM EDT ----- Regarding: RE: precert No PA is required. They just need to be sure they have a referral from the PCP.

## 2022-04-18 NOTE — Telephone Encounter (Signed)
Cochise, Dinneen T - 04/10/2022  2:20 PM Reesa Chew, CMA  Sent: Thu April 18, 2022  3:32 PM  To: Loa Socks, LPN; Quintella Reichert, MD          Message  Referral placed by [facility] to Dr Annalee Genta. Thanks

## 2022-04-20 ENCOUNTER — Other Ambulatory Visit: Payer: Self-pay | Admitting: Student in an Organized Health Care Education/Training Program

## 2022-04-20 ENCOUNTER — Other Ambulatory Visit: Payer: Self-pay | Admitting: Family Medicine

## 2022-04-22 ENCOUNTER — Other Ambulatory Visit (HOSPITAL_BASED_OUTPATIENT_CLINIC_OR_DEPARTMENT_OTHER): Payer: Self-pay

## 2022-04-23 ENCOUNTER — Other Ambulatory Visit (HOSPITAL_BASED_OUTPATIENT_CLINIC_OR_DEPARTMENT_OTHER): Payer: Self-pay

## 2022-04-23 MED ORDER — TESTOSTERONE 50 MG/5GM (1%) TD GEL
5.0000 g | Freq: Every day | TRANSDERMAL | 0 refills | Status: DC
  Filled 2022-04-23 – 2022-04-30 (×2): qty 150, 30d supply, fill #0

## 2022-04-24 ENCOUNTER — Other Ambulatory Visit (HOSPITAL_BASED_OUTPATIENT_CLINIC_OR_DEPARTMENT_OTHER): Payer: Self-pay

## 2022-04-25 ENCOUNTER — Telehealth: Payer: Self-pay

## 2022-04-25 NOTE — Telephone Encounter (Signed)
Patient called to follow up on scheduling status for his procedure.

## 2022-04-25 NOTE — Telephone Encounter (Signed)
Left patient a message that he is on the hospital waiting list for procedures and should be contacted when we can get him scheduled.

## 2022-04-25 NOTE — Telephone Encounter (Signed)
Inbound fax from DME supplier requesting form be completed and faxed with clinical notes. DME supplies ordered via [employer] through online portal.  

## 2022-04-28 ENCOUNTER — Other Ambulatory Visit: Payer: Self-pay

## 2022-04-28 ENCOUNTER — Other Ambulatory Visit: Payer: Self-pay | Admitting: Family Medicine

## 2022-04-28 ENCOUNTER — Other Ambulatory Visit: Payer: Self-pay | Admitting: Student in an Organized Health Care Education/Training Program

## 2022-04-30 ENCOUNTER — Other Ambulatory Visit: Payer: Self-pay

## 2022-04-30 ENCOUNTER — Other Ambulatory Visit (HOSPITAL_BASED_OUTPATIENT_CLINIC_OR_DEPARTMENT_OTHER): Payer: Self-pay

## 2022-04-30 DIAGNOSIS — I2510 Atherosclerotic heart disease of native coronary artery without angina pectoris: Secondary | ICD-10-CM

## 2022-04-30 DIAGNOSIS — I208 Other forms of angina pectoris: Secondary | ICD-10-CM

## 2022-04-30 DIAGNOSIS — G4733 Obstructive sleep apnea (adult) (pediatric): Secondary | ICD-10-CM

## 2022-04-30 DIAGNOSIS — I2089 Other forms of angina pectoris: Secondary | ICD-10-CM

## 2022-04-30 DIAGNOSIS — I10 Essential (primary) hypertension: Secondary | ICD-10-CM

## 2022-04-30 MED ORDER — ISOSORBIDE MONONITRATE ER 30 MG PO TB24
30.0000 mg | ORAL_TABLET | Freq: Every day | ORAL | 2 refills | Status: DC
  Filled 2022-04-30 – 2022-06-04 (×2): qty 90, 90d supply, fill #0
  Filled 2022-08-21: qty 90, 90d supply, fill #1

## 2022-05-01 ENCOUNTER — Other Ambulatory Visit (HOSPITAL_BASED_OUTPATIENT_CLINIC_OR_DEPARTMENT_OTHER): Payer: Self-pay

## 2022-05-02 ENCOUNTER — Other Ambulatory Visit (HOSPITAL_BASED_OUTPATIENT_CLINIC_OR_DEPARTMENT_OTHER): Payer: Self-pay

## 2022-05-02 ENCOUNTER — Ambulatory Visit
Payer: No Typology Code available for payment source | Attending: Student in an Organized Health Care Education/Training Program | Admitting: Student in an Organized Health Care Education/Training Program

## 2022-05-02 ENCOUNTER — Encounter: Payer: Self-pay | Admitting: Student in an Organized Health Care Education/Training Program

## 2022-05-02 VITALS — BP 121/69 | HR 84 | Temp 97.4°F | Resp 16 | Ht 68.0 in | Wt 192.0 lb

## 2022-05-02 DIAGNOSIS — Z9689 Presence of other specified functional implants: Secondary | ICD-10-CM | POA: Diagnosis present

## 2022-05-02 DIAGNOSIS — G894 Chronic pain syndrome: Secondary | ICD-10-CM | POA: Diagnosis present

## 2022-05-02 DIAGNOSIS — M792 Neuralgia and neuritis, unspecified: Secondary | ICD-10-CM | POA: Insufficient documentation

## 2022-05-02 DIAGNOSIS — M542 Cervicalgia: Secondary | ICD-10-CM | POA: Diagnosis present

## 2022-05-02 DIAGNOSIS — M5412 Radiculopathy, cervical region: Secondary | ICD-10-CM | POA: Diagnosis present

## 2022-05-02 DIAGNOSIS — M4802 Spinal stenosis, cervical region: Secondary | ICD-10-CM | POA: Insufficient documentation

## 2022-05-02 MED ORDER — TAMSULOSIN HCL 0.4 MG PO CAPS
0.4000 mg | ORAL_CAPSULE | Freq: Every day | ORAL | 1 refills | Status: DC
  Filled 2022-05-02 – 2022-06-04 (×2): qty 90, 90d supply, fill #0
  Filled 2022-08-31: qty 90, 90d supply, fill #1

## 2022-05-02 MED ORDER — ONDANSETRON 4 MG PO TBDP
4.0000 mg | ORAL_TABLET | Freq: Three times a day (TID) | ORAL | 0 refills | Status: DC | PRN
Start: 2022-05-02 — End: 2023-02-11
  Filled 2022-05-02: qty 20, 7d supply, fill #0

## 2022-05-02 MED ORDER — ARIPIPRAZOLE 10 MG PO TABS
10.0000 mg | ORAL_TABLET | Freq: Every morning | ORAL | 1 refills | Status: DC
  Filled 2022-05-02 – 2022-06-04 (×2): qty 90, 90d supply, fill #0
  Filled 2022-08-31: qty 90, 90d supply, fill #1

## 2022-05-02 MED ORDER — BUPRENORPHINE 15 MCG/HR TD PTWK
1.0000 | MEDICATED_PATCH | TRANSDERMAL | 2 refills | Status: DC
  Filled 2022-05-02: qty 4, 28d supply, fill #0
  Filled 2022-05-29: qty 4, 28d supply, fill #1
  Filled 2022-06-23: qty 4, 28d supply, fill #2

## 2022-05-02 NOTE — Progress Notes (Signed)
Nursing Pain Medication Assessment:  Safety precautions to be maintained throughout the outpatient stay will include: orient to surroundings, keep bed in low position, maintain call bell within reach at all times, provide assistance with transfer out of bed and ambulation.  Medication Inspection Compliance:  did not bring box   Medication: Duragesic patch Pill/Patch Count:  0 of 4 pills remain Pill/Patch Appearance:  did not bring empty box  Bottle Appearance: No container available. Did not bring bottle(s) to appointment. Filled Date: 08 / 10 / 2023 Last Medication intake:   applied last Friday.

## 2022-05-02 NOTE — Progress Notes (Signed)
PROVIDER NOTE: Information contained herein reflects review and annotations entered in association with encounter. Interpretation of such information and data should be left to medically-trained personnel. Information provided to patient can be located elsewhere in the medical record under "Patient Instructions". Document created using STT-dictation technology, any transcriptional errors that may result from process are unintentional.    Patient: Brandon [name]  Service Category: E/M  Provider: Gillis Santa, MD  DOB: [birth year]  DOS: 05/02/2022  Specialty: Interventional Pain Management  MRN: [id number]  Setting: Ambulatory outpatient  PCP: Luetta Nutting, DO  Type: Established Patient    Referring Provider: Luetta Nutting, DO  Location: Office  Delivery: Face-to-face     HPI  [name], a 66 y.o. year old male, is here today because of his Cervical radicular pain [M54.12]. [name] primary complain today is Back Pain (Lumbar bilateral ) and Neck Pain (Right side ) Last encounter: My last encounter with him was on 6/\132023. Pertinent problems: Mr. Rockers has Type 2 diabetes mellitus with other specified complication ([place]); Lumbar facet arthropathy; Coronary artery disease; Acute pain of left shoulder; Sacroiliac joint disease; Chronic pain syndrome; History of lumbar fusion (L4/5); Failed back surgical syndrome; and Pain management contract signed on their pertinent problem list. Pain Assessment: Severity of Chronic pain is reported as a 2 /10. Location: Back (right neck pain when sitting that radiates into right shoulder and is unbearable.  goes away when standing.) Lower, Left, Right/into right hip and leg. Onset: More than a month ago. Quality: Discomfort, Constant, Pressure, Sharp. Timing: Constant. Modifying factor(s): pain patches. Vitals:  height is '5\' 8"'  (1.727 m) and weight is 192 lb (87.1 kg). His temporal temperature is 97.4 F (36.3 C) (abnormal). His blood pressure is 121/69 and  his pulse is 84. His respiration is 16 and oxygen saturation is 98%.   Reason for encounter: medication management.  Brandon [name] is also complaining of increased right cervical spine pain with radiation into his right shoulder and down to his right fingers in a dermatomal fashion.  He states that the pain is worse with sitting at his desk.  He tries to stretch but he states the pain is getting worse.  Cervical spine x-ray from 11/29/2020 shows  CERVICAL SPINE - COMPLETE 4+ VIEW   COMPARISON:  None.   FINDINGS: No fracture or static subluxation of the cervical spine. There is mild disc space height loss and osteophytosis of the lower cervical levels, with sizable anterior osteophytes at C2-C3, C5-C6, and C6-C7. There is likely mild left-sided bony neural foraminal stenosis at C5-C6 and C6-C7. No evident right-sided bony neural foraminal stenosis. The skull base, cervical soft tissues, and partially included upper chest are unremarkable.   IMPRESSION: 1.  No fracture or static subluxation of the cervical spine.   2. Mild disc space height loss and osteophytosis of the lower cervical levels, with sizable anterior osteophytes at C2-C3, C5-C6, and C6-C7.   3. There is likely mild left-sided bony neural foraminal stenosis at C5-C6 and C6-C7.   4. Cervical disc and neural foraminal pathology may be further evaluated by MRI if indicated by neurologically localizing signs and symptoms.    I discussed follow-up with a cervical MRI.  Since it has been greater than 12 months since his previous drug-eluting stent, we could consider a cervical epidural steroid injection.  Patient will need to stop his Plavix 7 days prior which he has got clearance from the past for his lumbar facet medial branch nerve blocks.  I will call the patient with treatment plan after his cervical MRI.  He is aware that should he want to pursue his diagnostic lumbar facet medial branch nerve blocks that he can.  See previous  procedure note.  He is endorsing benefit with Butrans patch at 15 mcg an hour.  No side effects.   02/05/22 [name] presents today for medication management.  His last clinic visit with me was on October 11, 2021.  Since then he was hospitalized for a small bowel obstruction that was managed medically, nonsurgically.  He states that he has passed his 1 year anniversary for which he received cardiac stents.  He is following up with his cardiologist next week.  If he is able to get clearance to stop Plavix for 7 days, we have discussed doing a diagnostic lumbar facet medial branch nerve block bilaterally at L3, L4, L5 for his low back pain.  Based on our ASRA guidelines, he needs to be off of Plavix for 7 days and supplement with aspirin 81 g.  Otherwise I will refill his Butrans patch below.  HPI from 10/11/2021  Given his cardiac history and history of drug-eluting stents, patient did not get clearance to stop his dual antiplatelet therapy for his spinal injections they will have to wait until July to reassess.  Patient is indeed high risk to be off of his Plavix.  For this reason we will hold off on any spinal injections.  In the interim, we discussed increasing his buprenorphine transdermal patch from 15 mcg an hour to 20 mcg an hour.  I will see the patient back in June to see how he is doing and to hopefully schedule bilateral L3, L5 medial branch nerve blocks for lumbar facet arthropathy and spondylosis.  I will have him sign pain contract today.   New pt visit: 10/21/21  [name] is a pleasant 66 year old male who presents with a chief complaint of low back pain with radiation into his right hip and right buttock.  Brandon [name] recently moved from [place] with his wife where he was for the last 6 years.  He was seeing pain management there.  He has a history of L4-L5 decompression and fusion for disc herniations that were resulting in leg pain.  He has had multiple spinal injections including epidural steroid  injections as well as a spinal cord stimulator that was implanted in [place] by his pain physician.  He has a Nevro spinal cord stimulator in place.  He states that he is experiencing not as great analgesic benefit with the stimulator as he was in [place].  He has difficulty walking for extended period of time without having pain in his lower back that radiates into his buttock region.  He is status post bilateral SI joint injection with ultrasound guidance which unfortunately was not effective.  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   In regards to medication management, he has failed various neuropathic's including gabapentin, Lyrica, Cymbalta, TCAs.  He takes tizanidine as needed and also finds benefit with Butrans patch at 15 mcg an hour.  He also utilizes a lidocaine patch.  Cardiac history significant for coronary artery disease status post 3 drug-eluting stents with the most recent 1 placed June 2022.  He is being followed by cardiology.  Pharmacotherapy Assessment  Analgesic: Butrans  101mg/hr   Monitoring: [place] PMP: PDMP reviewed during this encounter.       Pharmacotherapy: No side-effects or adverse reactions reported. Compliance: No problems identified. Effectiveness: Clinically acceptable.  PJanett Billow RN  05/02/2022 10:19 AM  Sign when Signing Visit Nursing Pain Medication Assessment:  Safety precautions to be maintained throughout the outpatient stay will include: orient to surroundings, keep bed in low position, maintain call bell within reach at all times, provide assistance with transfer out of bed and ambulation.  Medication Inspection Compliance:  did not bring box   Medication: Duragesic patch Pill/Patch Count:  0 of 4 pills remain Pill/Patch Appearance:  did  not bring empty box  Bottle Appearance: No container available. Did not bring bottle(s) to appointment. Filled Date: 08 / 10 / 2023 Last Medication intake:   applied last Friday.     UDS:  Summary  Date Value Ref Range Status  10/02/2021 Note  Final    Comment:    ==================================================================== Compliance Drug Analysis, Ur ==================================================================== Test                             Result       Flag       Units  Drug Present and Declared for Prescription Verification   Buprenorphine                  8            EXPECTED   ng/mg creat   Norbuprenorphine               6            EXPECTED   ng/mg creat    Source of buprenorphine is a scheduled prescription medication.    Norbuprenorphine is an expected metabolite of buprenorphine.    Tizanidine                     PRESENT      EXPECTED   Bupropion                      PRESENT      EXPECTED   Hydroxybupropion               PRESENT      EXPECTED    Hydroxybupropion is an expected metabolite of bupropion.    Aripiprazole                   PRESENT      EXPECTED   Metoprolol                     PRESENT      EXPECTED  Drug Present not Declared for Prescription Verification   Acetaminophen                  PRESENT      UNEXPECTED  Drug Absent but Declared for Prescription Verification   Alpha-hydroxytriazolam         Not Detected UNEXPECTED ng/mg creat   Tramadol                       Not Detected UNEXPECTED ng/mg creat   Trazodone                      Not Detected UNEXPECTED   Salicylate  Not Detected UNEXPECTED    Aspirin, as indicated in the declared medication list, is not always    detected even when used as directed.    Diphenhydramine                Not Detected UNEXPECTED   Lidocaine                      Not Detected UNEXPECTED    Lidocaine, as indicated in the declared medication list, is not    always detected even when used  as directed.  ==================================================================== Test                      Result    Flag   Units      Ref Range   Creatinine              99               mg/dL      >=20 ==================================================================== Declared Medications:  The flagging and interpretation on this report are based on the  following declared medications.  Unexpected results may arise from  inaccuracies in the declared medications.   **Note: The testing scope of this panel includes these medications:   Aripiprazole (Abilify)  Bupropion (Wellbutrin)  Diphenhydramine  Metoprolol (Toprol)  Tramadol (Ultram)  Trazodone (Desyrel)  Triazolam (Halcion)   **Note: The testing scope of this panel does not include small to  moderate amounts of these reported medications:   Aspirin  Buprenorphine Patch (BuTrans)  Lidocaine  Tizanidine (Zanaflex)  Topical Lidocaine (Lidoderm)   **Note: The testing scope of this panel does not include the  following reported medications:   Amlodipine (Norvasc)  Atorvastatin (Lipitor)  Clopidogrel (Plavix)  Empagliflozin (Jardiance)  Insulin (Humalog)  Isosorbide (Imdur)  Losartan (Cozaar)  Metoclopramide (Reglan)  Nitroglycerin (Nitrostat)  Nystatin (Mycostatin)  Ondansetron (Zofran)  Pantoprazole (Protonix)  Prednisolone  Semaglutide (Ozempic)  Tamsulosin (Flomax)  Testosterone ==================================================================== For clinical consultation, please call [phone]. ====================================================================      ROS  Constitutional: Denies any fever or chills Gastrointestinal: No reported hemesis, hematochezia, vomiting, or acute GI distress Musculoskeletal:  Right neck pain with radiation into right arm Neurological: No reported episodes of acute onset apraxia, aphasia, dysarthria, agnosia, amnesia, paralysis, loss of coordination, or loss  of consciousness  Medication Review  ARIPiprazole, Dexcom G6 Sensor, Na Sulfate-K Sulfate-Mg Sulf, Semaglutide (2 MG/DOSE), albuterol, aspirin EC, atorvastatin, b complex vitamins, buPROPion, buprenorphine, clopidogrel, empagliflozin, ferrous sulfate, insulin lispro, isosorbide mononitrate, lidocaine, metoCLOPramide, metoprolol succinate, ondansetron, pantoprazole, tamsulosin, testosterone, and tiZANidine  History Review  Allergy: Mr. Steers is allergic to kiwi extract, other, penicillins, ancef [cefazolin], latex, levaquin [levofloxacin], and peach [prunus persica]. Drug: Mr. Eberwein  reports that he does not currently use drugs. Alcohol:  reports that he does not currently use alcohol. Tobacco:  reports that he quit smoking about 16 years ago. His smoking use included cigarettes. He has never used smokeless tobacco. Social: Mr. Ayala  reports that he quit smoking about 16 years ago. His smoking use included cigarettes. He has never used smokeless tobacco. He reports that he does not currently use alcohol. He reports that he does not currently use drugs. Medical:  has a past medical history of Anxiety, Arthritis, Basal cell carcinoma (BCC) in situ of skin, Chronic back pain, Colon polyps, Coronary artery disease, Depression, Diabetes mellitus without complication ([place]), Fibromyalgia, Gallstones, GERD (gastroesophageal reflux disease), Hepatitis B, HLD (hyperlipidemia), Hypertension, Osteoarthritis, Pneumonia,  SBO (small bowel obstruction) ([place]), and Sleep apnea. Surgical: Mr. Silvestro  has a past surgical history that includes Spinal cord stimulator insertion; Cholecystectomy, laparoscopic; Cataract extraction w/ intraocular lens  implant, bilateral; Lumbar fusion; XI Robotic assisted inguinal hernia repair with mesh; LEFT HEART CATH AND CORONARY ANGIOGRAPHY (N/A, 08/15/2020); Intravascular Ultrasound/IVUS (N/A, 08/15/2020); CORONARY STENT INTERVENTION (N/A, 08/15/2020); Cardiac catheterization; LEFT  HEART CATH AND CORONARY ANGIOGRAPHY (N/A, 01/17/2021); CORONARY STENT INTERVENTION (N/A, 01/17/2021); Microdiscectomy lumbar (2006); and Spine surgery. Family: family history includes Anxiety disorder in his father and mother; CAD in his brother; COPD in his mother; Cancer in his father; Colon cancer in his mother; Coronary artery disease in his father; Depression in his father and mother; Leukemia in his maternal grandmother; Multiple sclerosis in his daughter.  Laboratory Chemistry Profile   Renal Lab Results  Component Value Date   BUN 12 03/26/2022   CREATININE 0.82 03/26/2022   BCR SEE NOTE: 03/26/2022   GFRAA >60 04/17/2020   GFRNONAA >60 01/02/2022    Hepatic Lab Results  Component Value Date   AST 19 03/26/2022   ALT 12 03/26/2022   ALBUMIN 3.7 12/30/2021   ALKPHOS 69 12/30/2021   LIPASE 42 12/29/2021    Electrolytes Lab Results  Component Value Date   NA 141 03/26/2022   K 4.2 03/26/2022   CL 104 03/26/2022   CALCIUM 9.5 03/26/2022   MG 2.3 01/09/2022   PHOS 4.1 01/01/2022    Bone Lab Results  Component Value Date   TESTOSTERONE 522 03/26/2022    Inflammation (CRP: Acute Phase) (ESR: Chronic Phase) Lab Results  Component Value Date   CRP 1.5 (H) 08/13/2020   ESRSEDRATE 17 (H) 08/13/2020   LATICACIDVEN 1.0 12/30/2021         Note: Above Lab results reviewed.  Recent Imaging Review  DG Abd 1 View CLINICAL DATA:  Small bowel obstruction. Patient denies abdominal pain. Diarrhea this morning.  EXAM: ABDOMEN - 1 VIEW  COMPARISON:  Abdominal x-rays from yesterday.  FINDINGS: Oral contrast throughout the colon. No residual dilated small bowel loops. No acute osseous abnormality.  IMPRESSION: 1. Resolved partial small bowel obstruction.  Electronically Signed   By: Titus Dubin M.D.   On: 12/31/2021 09:08 Note: Reviewed        Physical Exam  General appearance: Well nourished, well developed, and well hydrated. In no apparent acute  distress Mental status: Alert, oriented x 3 (person, place, & time)       Respiratory: No evidence of acute respiratory distress Eyes: PERLA Vitals: BP 121/69 (BP Location: Right Arm, Patient Position: Sitting, Cuff Size: Normal)   Pulse 84   Temp (!) 97.4 F (36.3 C) (Temporal)   Resp 16   Ht '5\' 8"'  (1.727 m)   Wt 192 lb (87.1 kg)   SpO2 98%   BMI 29.19 kg/m  BMI: Estimated body mass index is 29.19 kg/m as calculated from the following:   Height as of this encounter: '5\' 8"'  (1.727 m).   Weight as of this encounter: 192 lb (87.1 kg). Ideal: Ideal body weight: 68.4 kg (150 lb 12.7 oz) Adjusted ideal body weight: 75.9 kg (167 lb 4.4 oz)  Cervical Spine Area Exam  Skin & Axial Inspection: No masses, redness, edema, swelling, or associated skin lesions Alignment: Symmetrical Functional ROM: Pain restricted ROM, to the right Stability: No instability detected Muscle Tone/Strength: Functionally intact. No obvious neuro-muscular anomalies detected. Sensory (Neurological): Dermatomal pain pattern Palpation: No palpable anomalies  Upper Extremity (UE) Exam    Side: Right upper extremity  Side: Left upper extremity  Skin & Extremity Inspection: Skin color, temperature, and hair growth are WNL. No peripheral edema or cyanosis. No masses, redness, swelling, asymmetry, or associated skin lesions. No contractures.  Skin & Extremity Inspection: Skin color, temperature, and hair growth are WNL. No peripheral edema or cyanosis. No masses, redness, swelling, asymmetry, or associated skin lesions. No contractures.  Functional ROM: Pain restricted ROM          Functional ROM: Unrestricted ROM          Muscle Tone/Strength: Functionally intact. No obvious neuro-muscular anomalies detected.  Muscle Tone/Strength: Functionally intact. No obvious neuro-muscular anomalies detected.  Sensory (Neurological): Dermatomal pain pattern          Sensory (Neurological): Unimpaired          Palpation: No  palpable anomalies              Palpation: No palpable anomalies              Provocative Test(s):  Phalen's test: deferred Tinel's test: deferred Apley's scratch test (touch opposite shoulder):  Action 1 (Across chest): Decreased ROM Action 2 (Overhead): Decreased ROM Action 3 (LB reach): Decreased ROM   Provocative Test(s):  Phalen's test: deferred Tinel's test: deferred Apley's scratch test (touch opposite shoulder):  Action 1 (Across chest): deferred Action 2 (Overhead): deferred Action 3 (LB reach): deferred       Thoracic Spine Area Exam  Skin & Axial Inspection: Well healed scar from previous spine surgery detected Alignment: Symmetrical Functional ROM: Pain restricted ROM Stability: No instability detected Muscle Tone/Strength: Functionally intact. No obvious neuro-muscular anomalies detected. Sensory (Neurological): Neurogenic pain pattern Muscle strength & Tone: No palpable anomalies   Lumbar Spine Area Exam  Skin & Axial Inspection: Well healed scar from previous spine surgery detected Alignment: Symmetrical Functional ROM: Pain restricted ROM affecting both sides Stability: No instability detected Muscle Tone/Strength: Functionally intact. No obvious neuro-muscular anomalies detected. Sensory (Neurological): Dermatomal pain pattern + Lumbar facet extension positive for pain consistent with facet syndrome Palpation: IPG present    Gait & Posture Assessment  Ambulation: Limited Gait: Antalgic Posture: Difficulty standing up straight, due to pain    Lower Extremity Exam      Side: Right lower extremity   Side: Left lower extremity  Stability: No instability observed           Stability: No instability observed          Skin & Extremity Inspection: Skin color, temperature, and hair growth are WNL. No peripheral edema or cyanosis. No masses, redness, swelling, asymmetry, or associated skin lesions. No contractures.   Skin & Extremity Inspection: Skin color,  temperature, and hair growth are WNL. No peripheral edema or cyanosis. No masses, redness, swelling, asymmetry, or associated skin lesions. No contractures.  Functional ROM: Pain restricted ROM for hip and knee joints           Functional ROM: Pain restricted ROM for hip and knee joints          Muscle Tone/Strength: Functionally intact. No obvious neuro-muscular anomalies detected.   Muscle Tone/Strength: Functionally intact. No obvious neuro-muscular anomalies detected.  Sensory (Neurological): Arthropathic arthralgia         Sensory (Neurological): Arthropathic arthralgia        DTR: Patellar: deferred today Achilles: deferred today Plantar: deferred today   DTR: Patellar: deferred today Achilles: deferred today Plantar: deferred today  Palpation:  No palpable anomalies   Palpation: No palpable anomalies       Assessment   Diagnosis Status  1. Cervical radicular pain (right C5/6)   2. Neuroforaminal stenosis of cervical spine   3. Radicular pain in right arm   4. Spinal cord stimulator status (Nevro)   5. Chronic pain syndrome   6. Cervicalgia    Persistent Persistent Persistent    Plan of Care    Mr. AIDYN KELLIS has a current medication list which includes the following long-term medication(s): albuterol, aripiprazole, atorvastatin, bupropion, insulin lispro, iron (ferrous sulfate), isosorbide mononitrate, metoclopramide, metoprolol succinate, pantoprazole, testosterone, and tizanidine.  1. Cervical radicular pain (right C5/6) - Ambulatory referral to Physical Therapy  2. Neuroforaminal stenosis of cervical spine - Ambulatory referral to Physical Therapy  3. Radicular pain in right arm - Ambulatory referral to Physical Therapy  4. Spinal cord stimulator status (Nevro) - Ambulatory referral to Physical Therapy  5. Chronic pain syndrome - Ambulatory referral to Physical Therapy  6. Cervicalgia - MR CERVICAL SPINE WO CONTRAST; Future - Ambulatory referral to  Physical Therapy     Pharmacotherapy (Medications Ordered): Meds ordered this encounter  Medications   buprenorphine (BUTRANS) 15 MCG/HR    Sig: Place 1 patch onto the skin once a week.    Dispense:  4 patch    Refill:  2    Chronic Pain: STOP Act (Not applicable) Fill 1 day early if closed on refill date. Avoid benzodiazepines within 8 hours of opioids   Orders:  Orders Placed This Encounter  Procedures   MR CERVICAL SPINE [place]    Patient presents with axial pain with possible radicular component. Please assist [place] in identifying specific level(s) and laterality of any additional findings such as: 1. Facet (Zygapophyseal) joint DJD (Hypertrophy, space narrowing, subchondral sclerosis, and/or osteophyte formation) 2. DDD and/or IVDD (Loss of disc height, desiccation, gas patterns, osteophytes, endplate sclerosis, or "Black disc disease") 3. Pars defects 4. Spondylolisthesis, spondylosis, and/or spondyloarthropathies (include Degree/Grade of displacement in mm) (stability) 5. Vertebral body Fractures (acute/chronic) (state percentage of collapse) 6. Demineralization (osteopenia/osteoporotic) 7. Bone pathology 8. Foraminal narrowing  9. Surgical changes 10. Central, Lateral Recess, and/or Foraminal Stenosis (include AP diameter of stenosis in mm) 11. Surgical changes (hardware type, status, and presence of fibrosis) 12. Modic Type Changes (MRI only) 13. IVDD (Disc bulge, protrusion, herniation, extrusion) (Level, laterality, extent)    Standing Status:   Future    Standing Expiration Date:   06/01/2022    Scheduling Instructions:     Imaging must be done as soon as possible. Inform patient that order will expire within 30 days and I will not renew it.    Order Specific Question:   What is the patient's sedation requirement?    Answer:   Anti-anxiety    Order Specific Question:   Does the patient have a pacemaker or implanted devices?    Answer:   Yes    Order Specific  Question:   Manufacturer of pacemake or implanted device?    Answer:   Nev[employer] SCS- MRI compatible, please contact [employer] rep- contact info on [employer]    Order Specific Question:   Year of pacemaker or implanted device?    Answer:   03/05/2017    Order Specific Question:   Preferred imaging location?    Answer:   [facility]    Order Specific Question:   Call Results- Best Contact Number?    Answer:   [phone] ([facility])    Order Specific Question:   Radiology Contrast Protocol - do NOT remove file path    Answer:   [email or web address]   Ambulatory referral to Physical Therapy    Referral Priority:   Routine    Referral Type:   Physical Medicine    Referral Reason:   Specialty Services Required    Requested Specialty:   Physical Therapy    Number of Visits Requested:   1   Follow-up plan:   Return in about 16 weeks (around 08/22/2022) for Medication Management, in person.    Recent Visits Date Type Provider Dept  02/05/22 Office Visit Gillis Santa, MD [facility]  Showing recent visits within past 90 days and meeting all other requirements Today's Visits Date Type Provider Dept  05/02/22 Office Visit Gillis Santa, MD [facility]  Showing today's visits and meeting all other requirements Future Appointments Date Type Provider Dept  07/23/22 Appointment Gillis Santa, MD [facility]  Showing future appointments within next 90 days and meeting all other requirements  I discussed the assessment and treatment plan with the patient. The patient was provided an opportunity to ask questions and all were answered. The patient agreed with the plan and demonstrated an understanding of the instructions.  Patient advised to call back or seek an in-person evaluation if the symptoms or condition worsens.  Duration of encounter: 49mnutes.  Note by: BGillis Santa MD Date: 05/02/2022; Time: 10:51 AM

## 2022-05-03 ENCOUNTER — Other Ambulatory Visit (HOSPITAL_BASED_OUTPATIENT_CLINIC_OR_DEPARTMENT_OTHER): Payer: Self-pay

## 2022-05-06 ENCOUNTER — Other Ambulatory Visit: Payer: Self-pay | Admitting: Student in an Organized Health Care Education/Training Program

## 2022-05-11 ENCOUNTER — Other Ambulatory Visit: Payer: Self-pay | Admitting: Student in an Organized Health Care Education/Training Program

## 2022-05-13 ENCOUNTER — Ambulatory Visit (INDEPENDENT_AMBULATORY_CARE_PROVIDER_SITE_OTHER): Payer: No Typology Code available for payment source | Admitting: Internal Medicine

## 2022-05-13 ENCOUNTER — Encounter: Payer: Self-pay | Admitting: Internal Medicine

## 2022-05-13 ENCOUNTER — Other Ambulatory Visit (HOSPITAL_BASED_OUTPATIENT_CLINIC_OR_DEPARTMENT_OTHER): Payer: Self-pay

## 2022-05-13 VITALS — BP 118/72 | HR 79 | Ht 68.0 in | Wt 201.0 lb

## 2022-05-13 DIAGNOSIS — E1169 Type 2 diabetes mellitus with other specified complication: Secondary | ICD-10-CM | POA: Diagnosis not present

## 2022-05-13 DIAGNOSIS — I2510 Atherosclerotic heart disease of native coronary artery without angina pectoris: Secondary | ICD-10-CM

## 2022-05-13 DIAGNOSIS — E1159 Type 2 diabetes mellitus with other circulatory complications: Secondary | ICD-10-CM | POA: Diagnosis not present

## 2022-05-13 DIAGNOSIS — Z794 Long term (current) use of insulin: Secondary | ICD-10-CM | POA: Diagnosis not present

## 2022-05-13 DIAGNOSIS — E785 Hyperlipidemia, unspecified: Secondary | ICD-10-CM | POA: Diagnosis not present

## 2022-05-13 LAB — POCT GLYCOSYLATED HEMOGLOBIN (HGB A1C): Hemoglobin A1C: 6.1 % — AB (ref 4.0–5.6)

## 2022-05-13 MED ORDER — GVOKE HYPOPEN 1-PACK 1 MG/0.2ML ~~LOC~~ SOAJ
SUBCUTANEOUS | 99 refills | Status: AC
  Filled 2022-05-13: qty 0.2, 1d supply, fill #0
  Filled 2022-08-15 (×3): qty 0.2, 1d supply, fill #1
  Filled 2022-08-23: qty 0.2, 1d supply, fill #2
  Filled 2023-02-18: qty 0.2, 1d supply, fill #3

## 2022-05-13 NOTE — Patient Instructions (Addendum)
Please use the following pump settings: - Basal rates: 12 am: 0.5 units/h - Insulin to carb ratio: 12 am: 1:15 >> 1:20 - Target: 12 am: 1:100 - Correction factor (insulin sensitivity factor):  12 am:  - Active insulin time: 5h - Changes infusion site: q3 days  Please do the following approximately 15 minutes before every meal: - Enter carbs (C) - Enter sugars (S) - Start insulin bolus (I)  Please continue: - Jardiance 25 mg before breakfast - Ozempic 2 mg weekly   Please return in 6 months.

## 2022-05-13 NOTE — Progress Notes (Signed)
Patient ID: Brandon [name], male   DOB: December 02, 1955, 66 y.o.   MRN: [id number]  HPI: Brandon [name] is a 66 y.o.-year-old male, returning for follow-up for DM2, dx in 2004, insulin-dependent since 2012, uncontrolled, with complications (CAD, gastroparesis).  He previously saw Dr. Everardo All.  Last visit with me 4 months ago.  Interim history: He feels well, without complaints today other than weight gain of 10 pounds since last visit. He feels that his appetite increased recently. However, he just returned to work and he is more active.  Insulin pump: - T: slim x2  CGM: - Dexcom G6  Insulin: - Humalog  Supplier: [employer]  Reviewed HbA1c: Lab Results  Component Value Date   HGBA1C 6.2 (A) 01/07/2022   HGBA1C 6.3 (H) 11/01/2021   HGBA1C 6.4 (A) 09/04/2021   HGBA1C 6.5 (H) 08/28/2021   HGBA1C 6.1 (A) 05/29/2021   HGBA1C 6.3 (A) 03/22/2021   HGBA1C 6.8 (A) 01/16/2021   HGBA1C 6.5 (A) 11/15/2020   HGBA1C 10.1 (H) 08/13/2020   HGBA1C 7.2 (H) 04/07/2020   Pt is on a regimen of: - Jardiance 25 mg before breakfast - Ozempic 2 mg weekly - lost 65 lbs in last 1.5 years  Insulin pump settings: - Basal rates: 12 am: 0.5 units/h - Insulin to carb ratio: 12 am: 1:5 >> 1:15 - Target: 12 am: 1:100 - Correction factor (insulin sensitivity factor):  12 am:  - Active insulin time: 5h - Changes infusion site: q3 days  Total daily dose from basal insulin: 16 units (59%) Total daily dose from bolus insulin: 12 units (41%)  He checks his sugars with a Dexcom CGM, more than 4 times a day:   Previously:   Lowest sugar was 60 >> 54; he has hypoglycemia awareness at 70.  Highest sugar was 359 >> 261.  Glucometer:Freestyle  - no CKD, last BUN/creatinine:  Lab Results  Component Value Date   BUN 12 03/26/2022   BUN 10 01/09/2022   CREATININE 0.82 03/26/2022   CREATININE 0.80 01/09/2022  Not on ACE inhibitor/ARB.  -+ Dyslipidemia; last set of lipids: Lab Results  Component  Value Date   CHOL 105 03/26/2022   HDL 32 (L) 03/26/2022   LDLCALC 43 03/26/2022   TRIG 256 (H) 03/26/2022   CHOLHDL 3.3 03/26/2022  On Lipitor 80 mg daily.  - last eye exam was in 03/2021. No DR.   - no numbness and tingling in his feet.  Last foot exam in 05/2021.  On ASA 81.  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  ROS: + see HPI No increased urination, blurry vision, nausea, chest pain.  Past Medical History:  Diagnosis Date   Anxiety    Arthritis    Basal cell carcinoma (BCC) in situ of skin    Chronic back pain    has spinal cord stimulator and wears buprenorphine patch   Colon polyps    Coronary artery disease    S/p DES to mRCA in 12/21 // S/p DES to LCx x 2 in 12/2020 // Diff dz in small to mod Dx (not a good target for PCI); OM1 100 CTO; severe dz in small OM2 and PDA >> Med Rx   Depression    Diabetes mellitus without complication (HCC)    Fibromyalgia    Gallstones    GERD (gastroesophageal reflux disease)    Hepatitis B    HLD (hyperlipidemia)    Hypertension    Osteoarthritis    Pneumonia  SBO (small bowel obstruction) (HCC)    Sleep apnea    Past Surgical History:  Procedure Laterality Date   CARDIAC CATHETERIZATION     CATARACT EXTRACTION W/ INTRAOCULAR LENS  IMPLANT, BILATERAL     CHOLECYSTECTOMY, LAPAROSCOPIC     CORONARY STENT INTERVENTION N/A 08/15/2020   Procedure: CORONARY STENT INTERVENTION;  Surgeon: Corky CraftsVaranasi, Jayadeep S, MD;  Location: [facility];  Service: Cardiovascular;  Laterality: N/A;   CORONARY STENT INTERVENTION N/A 01/17/2021   Procedure: CORONARY STENT INTERVENTION;  Surgeon: Kathleene HazelMcAlhany, Christopher D, MD;  Location: [facility];  Service: Cardiovascular;  Laterality: N/A;   INTRAVASCULAR ULTRASOUND/IVUS N/A 08/15/2020   Procedure: Intravascular Ultrasound/IVUS;  Surgeon: Corky CraftsVaranasi, Jayadeep S, MD;  [facility];  Service: Cardiovascular;  Laterality: N/A;    LEFT HEART CATH AND CORONARY ANGIOGRAPHY N/A 08/15/2020   Procedure: LEFT HEART CATH AND CORONARY ANGIOGRAPHY;  Surgeon: Corky CraftsVaranasi, Jayadeep S, MD;  [facility];  Service: Cardiovascular;  Laterality: N/A;   LEFT HEART CATH AND CORONARY ANGIOGRAPHY N/A 01/17/2021   Procedure: LEFT HEART CATH AND CORONARY ANGIOGRAPHY;  Surgeon: Kathleene HazelMcAlhany, Christopher D, MD;  Location: [facility];  Service: Cardiovascular;  Laterality: N/A;   LUMBAR FUSION     L3-5 with rods   MICRODISCECTOMY LUMBAR  2006   SPINAL CORD STIMULATOR INSERTION     blood clot removed from spinal cord   SPINE SURGERY     blood clot removed from spinal cord   XI ROBOTIC ASSISTED INGUINAL HERNIA REPAIR WITH MESH     x 3 surgeries   Social History   Socioeconomic History   Marital status: Married    Spouse name: Not on file   Number of children: 4   Years of education: 16   Highest education level: Not on file  Occupational History   Occupation: disabled/ respiratory therapist  Tobacco Use   Smoking status: Former    Types: Cigarettes    Quit date: 2007    Years since quitting: 16.7   Smokeless tobacco: Never  Vaping Use   Vaping Use: Never used  Substance and Sexual Activity   Alcohol use: Not Currently   Drug use: Not Currently   Sexual activity: Yes  Other Topics Concern   Not on file  Social History Narrative   Not on file   Social Determinants of Health   Financial Resource Strain: Not on file  Food Insecurity: Not on file  Transportation Needs: Not on file  Physical Activity: Not on file  Stress: Not on file  Social Connections: Not on file  Intimate Partner Violence: Not on file   Current Outpatient Medications on File Prior to Visit  Medication Sig Dispense Refill   albuterol (VENTOLIN HFA) 108 (90 Base) MCG/ACT inhaler Inhale 2 puffs by mouth  into the lungs every 6 (six) hours as needed for wheezing. 17 g 11   ARIPiprazole (ABILIFY) 10 MG tablet Take 1 tablet (10 mg total)  by mouth every morning. 90 tablet 1   aspirin 81 MG EC tablet Take 81 mg by mouth every morning.     atorvastatin (LIPITOR) 80 MG tablet Take 1 tablet (80 mg total) by mouth daily.     b complex vitamins capsule Take 1 capsule by mouth daily.     buprenorphine (BUTRANS) 15 MCG/HR Place 1 patch onto the skin once a week. 4 patch 2   buPROPion (WELLBUTRIN XL) 300 MG 24 hr tablet Take 1 tablet (300  mg total) by mouth daily. 90 tablet 0   clopidogrel (PLAVIX) 75 MG tablet TAKE 1 TABLET (75 MG TOTAL) BY MOUTH DAILY WITH BREAKFAST. 90 tablet 3   Continuous Blood Gluc Sensor (DEXCOM G6 SENSOR) MISC Use as directed. Change sensor every 10 days 9 each 3   empagliflozin (JARDIANCE) 25 MG TABS tablet Take 1 tablet (25 mg total) by mouth daily. 90 tablet 3   insulin lispro (HUMALOG) 100 UNIT/ML injection For use in pump, total of 60 units per day 60 mL 3   Iron, Ferrous Sulfate, 325 (65 Fe) MG TABS Take 1 tablet (325 mg) by mouth daily. 100 tablet 1   isosorbide mononitrate (IMDUR) 30 MG 24 hr tablet Take 1 tablet (30 mg total) by mouth daily. 90 tablet 2   lidocaine (LIDODERM) 5 % Apply 1 - 3 patches on to the skin as directed. 12 hours on and 12 hours off. 90 patch 2   metoCLOPramide (REGLAN) 10 MG tablet Take 10 mg by mouth daily as needed for nausea or vomiting (upset stomach).     metoprolol succinate (TOPROL XL) 25 MG 24 hr tablet Take 1 tablet (25 mg total) by mouth daily. 90 tablet 2   Na Sulfate-K Sulfate-Mg Sulf 17.5-3.13-1.6 GM/177ML SOLN Take by mouth as directed 354 mL 0   ondansetron (ZOFRAN-ODT) 4 MG disintegrating tablet Take 1 tablet (4 mg total) by mouth every 8 (eight) hours as needed for nausea or vomiting. 20 tablet 0   pantoprazole (PROTONIX) 40 MG tablet Take 1 tablet (40 mg total) by mouth daily. 90 tablet 0   Semaglutide, 2 MG/DOSE, (OZEMPIC, 2 MG/DOSE,) 8 MG/3ML SOPN Inject 2 mg into the skin once a week. 9 mL 1   tamsulosin (FLOMAX) 0.4 MG CAPS capsule Take 1 capsule (0.4 mg total)  by mouth daily. 90 capsule 1   testosterone (ANDROGEL) 50 MG/5GM (1%) GEL Place 5 g (1 packet) onto the shoulders and upper arms daily as directed. 150 g 0   tiZANidine (ZANAFLEX) 4 MG tablet Take 1-1&1/2 tablets (4-6 mg total) by mouth every 8 (eight) hours as needed for muscle spasms. 90 tablet 3   No current facility-administered medications on file prior to visit.   Allergies  Allergen Reactions   Kiwi Extract Swelling    Mouth swelling.    Other Swelling and Other (See Comments)    EGGPLANT. Mouth swelling.   Penicillins Rash    Reaction: unknown   Ancef [Cefazolin] Rash   Latex Rash   Levaquin [Levofloxacin] Rash   Peach [Prunus Persica] Other (See Comments)    Burns mouth   Family History  Problem Relation Age of Onset   COPD Mother    Anxiety disorder Mother    Depression Mother    Colon cancer Mother    Cancer Father        mets, origin unknown   Anxiety disorder Father    Depression Father    Coronary artery disease Father    CAD Brother    Leukemia Maternal Grandmother    Multiple sclerosis Daughter    Diabetes Neg Hx     PE: BP 118/72 (BP Location: Left Arm, Patient Position: Sitting, Cuff Size: Normal)   Pulse 79   Ht 5\' 8"  (1.727 m)   Wt 201 lb (91.2 kg)   SpO2 94%   BMI 30.56 kg/m  Wt Readings from Last 3 Encounters:  05/13/22 201 lb (91.2 kg)  05/02/22 192 lb (87.1 kg)  03/26/22 195 lb (88.5  kg)   Constitutional: overweight, in NAD Eyes:  EOMI, no exophthalmos ENT: no neck masses, no cervical lymphadenopathy Cardiovascular: RRR, No MRG Respiratory: CTA B Musculoskeletal: no deformities Skin:no rashes Neurological: no tremor with outstretched hands Diabetic Foot Exam - Simple   Simple Foot Form Diabetic Foot exam was performed with the following findings: Yes 05/13/2022 11:11 AM  Visual Inspection No deformities, no ulcerations, no other skin breakdown bilaterally: Yes Sensation Testing Intact to touch and monofilament testing  bilaterally: Yes Pulse Check Posterior Tibialis and Dorsalis pulse intact bilaterally: Yes Comments    ASSESSMENT: 1. DM2, insulin-dependent, uncontrolled, with complications - CAD - s/p 3 stents - Gastroparesis  2. HL  PLAN:  1. Patient with longstanding, uncontrolled, type 2 diabetes, on SGLT2 inhibitor, weekly GLP-1 receptor agonist and insulin pump, with improved control.  At last visit, HbA1c was 6.2%.  At that time, he came to the clinic after recent admission for small bowel obstruction and we discussed about coming off Ozempic, but he wanted to continue due to excellent results on weight loss and blood sugars.  No abdominal pain now.  He still has constipation for which she takes MiraLAX. -At the last visit, reviewing the CGM trends, sugars were higher after meals due to not bolusing consistently before the meals.  He mostly relies on automatic boluses.  He rarely was introducing carbs or give mealtime bolus and we discussed that this was very important for diabetes control.  He had a very strict insulin to carb ratio and I advised him to relax this so that he did not drop his blood sugars after he started to take boluses correctly.  We also discussed about the CGM values lagging behind blood sugars after correcting a high or low blood sugar.  We did not change his basal rates at that time.  I sent a prescription for Baqsimi to his pharmacy but this was not covered by his pharmacy. CGM interpretation: -At today's visit, we reviewed his CGM downloads: It appears that 85% of values are in target range (goal >70%), while 15% are higher than 180 (goal <25%), and 0% are lower than 70 (goal <4%).  The calculated average blood sugar is 141.  The projected HbA1c for the next 3 months (GMI) is <7%. -Reviewing the CGM trends, his sugars are well controlled throughout the day with higher values after breakfast and lunch, but otherwise, with excellent control.  Upon review of his pump + CGM traces, it  appears that he is still only relying on automatic boluses all day, with the exception being when she entered a small amount of carbs and bolus before meals.  In the last 2 weeks, this only happens twice.  However, since he has type II rather than type 1 diabetes, and since he is also on Jardiance and Ozempic, this appears to still work well for him.  I still advised him to try to enter carbs into the pump and bolus for them especially for breakfast.  He will try this.  He mentions that in the past, he is blood sugar so it is advised him to his insulin to carb ratios in the morning so that he gains confidence and starts bolusing. - I suggested to:  Patient Instructions  Please use the following pump settings: - Basal rates: 12 am: 0.5 units/h - Insulin to carb ratio: 12 am: 1:15 >> 1:20 - Target: 12 am: 1:100 - Correction factor (insulin sensitivity factor):  12 am:  - Active insulin time: 5h -  Changes infusion site: q3 days  Please do the following approximately 15 minutes before every meal: - Enter carbs (C) - Enter sugars (S) - Start insulin bolus (I)  Please continue: - Jardiance 25 mg before breakfast - Ozempic 2 mg weekly   Please return in 6 months.  - we checked his HbA1c: 6.1% (excellent) - advised to check sugars at different times of the day - 4x a day, rotating check times - advised for yearly eye exams >> he is not UTD - return to clinic in 6 months  2. HL -Reviewed the latest lipid panel from last month: LDL at goal, triglycerides high, HDL low Lab Results  Component Value Date   CHOL 105 03/26/2022   HDL 32 (L) 03/26/2022   LDLCALC 43 03/26/2022   TRIG 256 (H) 03/26/2022   CHOLHDL 3.3 03/26/2022  -He is Lipitor 80 mg without side effects  Carlus Pavlov, MD PhD [facility]

## 2022-05-14 ENCOUNTER — Other Ambulatory Visit: Payer: Self-pay

## 2022-05-14 ENCOUNTER — Other Ambulatory Visit (HOSPITAL_BASED_OUTPATIENT_CLINIC_OR_DEPARTMENT_OTHER): Payer: Self-pay

## 2022-05-14 ENCOUNTER — Other Ambulatory Visit: Payer: Self-pay | Admitting: *Deleted

## 2022-05-14 MED ORDER — ATORVASTATIN CALCIUM 80 MG PO TABS: 80.0000 mg | ORAL_TABLET | Freq: Every day | ORAL | 3 refills | Status: DC

## 2022-05-15 ENCOUNTER — Other Ambulatory Visit (HOSPITAL_BASED_OUTPATIENT_CLINIC_OR_DEPARTMENT_OTHER): Payer: Self-pay

## 2022-05-16 ENCOUNTER — Other Ambulatory Visit: Payer: Self-pay | Admitting: Otolaryngology

## 2022-05-17 ENCOUNTER — Encounter: Payer: Self-pay | Admitting: Physical Therapy

## 2022-05-17 ENCOUNTER — Ambulatory Visit
Payer: No Typology Code available for payment source | Attending: Student in an Organized Health Care Education/Training Program | Admitting: Physical Therapy

## 2022-05-17 DIAGNOSIS — M5412 Radiculopathy, cervical region: Secondary | ICD-10-CM | POA: Diagnosis present

## 2022-05-17 DIAGNOSIS — M6281 Muscle weakness (generalized): Secondary | ICD-10-CM | POA: Diagnosis present

## 2022-05-17 DIAGNOSIS — R293 Abnormal posture: Secondary | ICD-10-CM | POA: Diagnosis present

## 2022-05-17 DIAGNOSIS — Z9689 Presence of other specified functional implants: Secondary | ICD-10-CM | POA: Diagnosis not present

## 2022-05-17 DIAGNOSIS — G894 Chronic pain syndrome: Secondary | ICD-10-CM | POA: Diagnosis not present

## 2022-05-17 DIAGNOSIS — M792 Neuralgia and neuritis, unspecified: Secondary | ICD-10-CM | POA: Insufficient documentation

## 2022-05-17 DIAGNOSIS — M4802 Spinal stenosis, cervical region: Secondary | ICD-10-CM | POA: Insufficient documentation

## 2022-05-17 DIAGNOSIS — M542 Cervicalgia: Secondary | ICD-10-CM | POA: Diagnosis not present

## 2022-05-17 NOTE — Therapy (Signed)
OUTPATIENT PHYSICAL THERAPY CERVICAL EVALUATION   Patient Name: Brandon [name] MRN: [id number] DOB:[birth year], 66 y.o., male Today's Date: 05/17/2022   PT End of Session - 05/17/22 1128     Visit Number 1    Number of Visits 12    Date for PT Re-Evaluation 06/28/22    Authorization Type [employer]    PT Start Time 1015    PT Stop Time 1058    PT Time Calculation (min) 43 min    Activity Tolerance Patient tolerated treatment well    Behavior During Therapy WFL for tasks assessed/performed             Past Medical History:  Diagnosis Date   Anxiety    Arthritis    Basal cell carcinoma (BCC) in situ of skin    Chronic back pain    has spinal cord stimulator and wears buprenorphine patch   Colon polyps    Coronary artery disease    S/p DES to mRCA in 12/21 // S/p DES to LCx x 2 in 12/2020 // Diff dz in small to mod Dx (not a good target for PCI); OM1 100 CTO; severe dz in small OM2 and PDA >> Med Rx   Depression    Diabetes mellitus without complication (HCC)    Fibromyalgia    Gallstones    GERD (gastroesophageal reflux disease)    Hepatitis B    HLD (hyperlipidemia)    Hypertension    Osteoarthritis    Pneumonia    SBO (small bowel obstruction) (HCC)    Sleep apnea    Past Surgical History:  Procedure Laterality Date   CARDIAC CATHETERIZATION     CATARACT EXTRACTION W/ INTRAOCULAR LENS  IMPLANT, BILATERAL     CHOLECYSTECTOMY, LAPAROSCOPIC     CORONARY STENT INTERVENTION N/A 08/15/2020   Procedure: CORONARY STENT INTERVENTION;  Surgeon: Corky Crafts, MD;  Location: [facility];  Service: Cardiovascular;  Laterality: N/A;   CORONARY STENT INTERVENTION N/A 01/17/2021   Procedure: CORONARY STENT INTERVENTION;  Surgeon: Kathleene Hazel, MD;  Location: [facility];  Service: Cardiovascular;  Laterality: N/A;   INTRAVASCULAR ULTRASOUND/IVUS N/A 08/15/2020   Procedure: Intravascular Ultrasound/IVUS;  Surgeon: Corky Crafts, MD;  Location:  [facility] INVASIVE CV LAB;  Service: Cardiovascular;  Laterality: N/A;   LEFT HEART CATH AND CORONARY ANGIOGRAPHY N/A 08/15/2020   Procedure: LEFT HEART CATH AND CORONARY ANGIOGRAPHY;  Surgeon: Corky Crafts, MD;  Location: [facility] INVASIVE CV LAB;  Service: Cardiovascular;  Laterality: N/A;   LEFT HEART CATH AND CORONARY ANGIOGRAPHY N/A 01/17/2021   Procedure: LEFT HEART CATH AND CORONARY ANGIOGRAPHY;  Surgeon: Kathleene Hazel, MD;  Location: [facility];  Service: Cardiovascular;  Laterality: N/A;   LUMBAR FUSION     L3-5 with rods   MICRODISCECTOMY LUMBAR  2006   SPINAL CORD STIMULATOR INSERTION     blood clot removed from spinal cord   SPINE SURGERY     blood clot removed from spinal cord   XI ROBOTIC ASSISTED INGUINAL HERNIA REPAIR WITH MESH     x 3 surgeries   Patient Active Problem List   Diagnosis Date Noted   Radicular pain in right arm 05/02/2022   Neuroforaminal stenosis of cervical spine 05/02/2022   Well adult exam 03/26/2022   SBO (small bowel obstruction) (HCC) 01/09/2022   Small bowel obstruction (HCC) 12/30/2021   Viral gastroenteritis 12/27/2021   AMS (altered mental status) 11/01/2021   Diabetes mellitus (HCC) 11/01/2021  Community acquired pneumonia 10/31/2021   Coughing 10/29/2021   Pain management contract signed 10/11/2021   History of lumbar fusion (L4/5) 10/02/2021   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   Chronic pain syndrome 06/01/2021   OSA (obstructive sleep apnea) 05/28/2021   Thrush 05/21/2021   Metallic taste 05/21/2021   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   Acute pain of left shoulder 04/03/2021   Unstable angina (HCC)    Vertigo 11/28/2020   Insomnia 09/03/2020   Status post coronary artery stent placement    Mixed hyperlipidemia    Coronary artery disease    Chest pain 08/11/2020   Lumbar facet arthropathy 06/22/2020   Major depression in partial remission (HCC) 06/04/2020   Hyperlipidemia associated with type 2 diabetes  mellitus (HCC) 04/14/2020   Primary hypertension 04/07/2020   Type 2 diabetes mellitus with other specified complication (HCC) 04/07/2020   Sepsis (HCC) 10/06/2019   Spinal cord stimulator status 05/19/2017   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   Erectile dysfunction 10/09/2015    REFERRING PROVIDER: Edward Jolly  REFERRING DIAG: Cervical radiculopathy, radicular pain in Rt arm  THERAPY DIAG:  Radiculopathy, cervical region  Muscle weakness (generalized)  Abnormal posture  Rationale for Evaluation and Treatment Rehabilitation  ONSET DATE: 02/2022  SUBJECTIVE:                                                                                                                                                                                                         SUBJECTIVE STATEMENT: Pt states that for the past 2 months he has been having pain staring in his neck on the Rt side and traveling to the Rt shoulder and occasionally to the Rt hand. The pain onsets during computer use and is relieved when out of position. He states the pain has increased in intensity and duration in the past month. He has tried moving his chair and his computer set up but this has not helped.  Pain remains for about 1 hour when out of position. Pt uses home TENS and hot pack to reduce pain.  PERTINENT HISTORY:  History of bulging discs in cervical spine per patient, L3-5 fusion with cage  PAIN:  Are you having pain? No - pt states then when pain onsets it is 8/10 Rt neck and shoulder  PRECAUTIONS: spinal Stimulator in lumbar spine, Basal cell carcinoma on neck  WEIGHT BEARING RESTRICTIONS No  FALLS:  Has patient fallen in last 6 months? 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 Increased mm spasticity bilat cervical paraspinals and upper traps   CERVICAL ROM:   Active ROM A/PROM (deg) eval  Flexion 50  Extension 35  Right lateral flexion 15 - symptoms  Left lateral flexion 25  Right rotation 40  Left rotation 38   (Blank rows = not tested)    UPPER EXTREMITY MMT:  MMT Right eval Left eval  Shoulder flexion 4/5 4/5  Shoulder extension    Shoulder abduction 4/5 4/5  Shoulder adduction    Shoulder extension    Shoulder internal rotation    Shoulder external rotation    Middle trapezius    Lower trapezius    Elbow flexion    Elbow extension    Wrist flexion    Wrist extension    Wrist ulnar deviation    Wrist radial deviation    Wrist pronation    Wrist supination    Grip strength 70 lbs 75 lbs   (Blank rows = not tested)  CERVICAL SPECIAL TESTS:  Spurling's test: Positive on Rt ULTT negative on Rt    TODAY'S TREATMENT:  05/17/22 SNAG cervical rotation 2 x 10 seconds Cervical extension with towel 2 x 10 seconds Upper trap stretch 2 x 10 sec Cervical retraction 2 x 5 sec Doorway stretch 90 degrees 2 x 30 sec  PATIENT EDUCATION:  Education details: HEP, PT POC and goals Person educated: Patient Education method: Explanation, Demonstration, and Handouts Education comprehension: verbalized understanding and returned demonstration   HOME EXERCISE PROGRAM: Access Code: [email or web address] URL: [email or web address] Date: 05/17/2022 Prepared by: Reggy Eye  Exercises - Seated Assisted Cervical Rotation with Towel  - 1 x daily - 7 x weekly - 1 sets - 10 reps - 10 sec hold - Cervical Extension AROM with Strap  - 1 x daily - 7 x weekly - 1 sets - 10 reps - 10 seconds hold - Seated Upper Trapezius Stretch  - 1 x daily - 7 x weekly - 1 sets - 10 reps - 10 seconds hold - Seated  Cervical Retraction  - 1 x daily - 7 x weekly - 1 sets - 10 reps - 3-5 seconds hold - Doorway Pec Stretch at 90 Degrees Abduction  - 1 x daily - 7 x weekly - 1 sets - 3 reps - 20-30 seconds hold  ASSESSMENT:  CLINICAL IMPRESSION: Patient is a 66 y.o. male who was seen today for physical therapy evaluation and treatment for cervical radiculopathy. Pt presents with impaired posture, decreased strength and ROM, increased muscle spasticity, hypomobility and decreased functional activity tolerance. Pt will benefit from skilled PT to address deficits and reduce pain and improve functional activity tolerance   OBJECTIVE IMPAIRMENTS decreased activity tolerance, decreased mobility, decreased ROM, decreased strength, hypomobility, increased muscle spasms, impaired sensation, postural dysfunction, and pain.   ACTIVITY LIMITATIONS carrying, sitting, and reach over head  PARTICIPATION LIMITATIONS: driving, community activity, and occupation  PERSONAL FACTORS Past/current experiences, Time since onset of injury/illness/exacerbation, and 1 comorbidity: lumbar fusion  are also affecting patient's functional outcome.   REHAB POTENTIAL: Good  CLINICAL DECISION MAKING: Evolving/moderate complexity  EVALUATION COMPLEXITY: Moderate   GOALS: Goals reviewed with patient? Yes    LONG TERM GOALS: Target date:  06/28/2022  Pt will be independent with HEP  Goal status: INITIAL  2.  Pt will improve FOTO to >= 64 to demo improved functional mobility Baseline: 49 Goal status: INITIAL  3.  Pt will improve cervical rotation and sidebending ROM by 20 degrees bilat Baseline: see eval Goal status: INITIAL  4.  Pt will tolerate working at computer x 1 hour with no increase in symptoms  Goal status: INITIAL  5.  Pt will improve Rt grip strength to >= 80lbs Baseline: 70 Goal status: INITIAL     PLAN: PT FREQUENCY: 2x/week  PT DURATION: 6 weeks  PLANNED INTERVENTIONS: Therapeutic exercises,  Therapeutic activity, Neuromuscular re-education, Balance training, Gait training, Patient/Family education, Self Care, Joint mobilization, Aquatic Therapy, Dry Needling, Electrical stimulation, Cryotherapy, Moist heat, Taping, Traction, Ionotophoresis 4mg /ml Dexamethasone, Manual therapy, and Re-evaluation  PLAN FOR NEXT SESSION: assess and progress HEP, postural strength, cervical ROM, try traction   Randy Castrejon, PT 05/17/2022, 11:49 AM

## 2022-05-20 ENCOUNTER — Other Ambulatory Visit: Payer: Self-pay | Admitting: Family Medicine

## 2022-05-20 ENCOUNTER — Other Ambulatory Visit (HOSPITAL_BASED_OUTPATIENT_CLINIC_OR_DEPARTMENT_OTHER): Payer: Self-pay

## 2022-05-20 ENCOUNTER — Other Ambulatory Visit: Payer: Self-pay

## 2022-05-20 ENCOUNTER — Telehealth: Payer: Self-pay | Admitting: Cardiology

## 2022-05-20 MED ORDER — ATORVASTATIN CALCIUM 80 MG PO TABS
80.0000 mg | ORAL_TABLET | Freq: Every day | ORAL | 2 refills | Status: DC
  Filled 2022-05-20: qty 90, 90d supply, fill #0
  Filled 2022-08-21: qty 90, 90d supply, fill #1

## 2022-05-20 NOTE — Telephone Encounter (Signed)
   Pre-operative Risk Assessment    Patient Name: Brandon [name]  DOB: [birth year] MRN: [id number]      Request for Surgical Clearance    Procedure:   Drug induced sleep endoscopy   Date of Surgery:  Clearance 05/23/22                                 Surgeon:  Dr. Jerrell Belfast  Surgeon's Group or Practice Name:  [facility]  Phone number:  [phone] Fax number:  [phone]    Type of Clearance Requested:   - Medical  - Pharmacy:  Hold Clopidogrel (Plavix) not sure if he needs to or not     Type of Anesthesia:  General    Additional requests/questions:    Dorthey Sawyer   05/20/2022, 1:16 PM

## 2022-05-20 NOTE — Telephone Encounter (Signed)
Called Angela Nevin, surgery scheduler at [facility] and left a message to clarify whether or not to hold Plavix.

## 2022-05-21 ENCOUNTER — Ambulatory Visit: Payer: No Typology Code available for payment source | Admitting: Physical Therapy

## 2022-05-21 ENCOUNTER — Other Ambulatory Visit (HOSPITAL_BASED_OUTPATIENT_CLINIC_OR_DEPARTMENT_OTHER): Payer: Self-pay

## 2022-05-21 ENCOUNTER — Encounter: Payer: Self-pay | Admitting: Physical Therapy

## 2022-05-21 DIAGNOSIS — M5412 Radiculopathy, cervical region: Secondary | ICD-10-CM

## 2022-05-21 DIAGNOSIS — R293 Abnormal posture: Secondary | ICD-10-CM

## 2022-05-21 DIAGNOSIS — M6281 Muscle weakness (generalized): Secondary | ICD-10-CM

## 2022-05-21 MED ORDER — TESTOSTERONE 50 MG/5GM (1%) TD GEL
5.0000 g | Freq: Every day | TRANSDERMAL | 1 refills | Status: DC
  Filled 2022-05-21 – 2022-06-04 (×2): qty 150, 30d supply, fill #0
  Filled 2022-06-23 – 2022-06-28 (×2): qty 150, 30d supply, fill #1

## 2022-05-21 NOTE — Therapy (Signed)
OUTPATIENT PHYSICAL THERAPY CERVICAL EVALUATION   Patient Name: Brandon [name] MRN: [id number] DOB:1956-06-12, 66 y.o., male Today's Date: 05/21/2022   PT End of Session - 05/21/22 0958     Visit Number 2    Number of Visits 12    Date for PT Re-Evaluation 06/28/22    PT Start Time 0910    PT Stop Time 0958    PT Time Calculation (min) 48 min    Activity Tolerance Patient tolerated treatment well    Behavior During Therapy [facility] for tasks assessed/performed              Past Medical History:  Diagnosis Date   Anxiety    Arthritis    Basal cell carcinoma (BCC) in situ of skin    Chronic back pain    has spinal cord stimulator and wears buprenorphine patch   Colon polyps    Coronary artery disease    S/p DES to mRCA in 12/21 // S/p DES to LCx x 2 in 12/2020 // Diff dz in small to mod Dx (not a good target for PCI); OM1 100 CTO; severe dz in small OM2 and PDA >> Med Rx   Depression    Diabetes mellitus without complication (HCC)    Fibromyalgia    Gallstones    GERD (gastroesophageal reflux disease)    Hepatitis B    HLD (hyperlipidemia)    Hypertension    Osteoarthritis    Pneumonia    SBO (small bowel obstruction) (HCC)    Sleep apnea    Past Surgical History:  Procedure Laterality Date   CARDIAC CATHETERIZATION     CATARACT EXTRACTION W/ INTRAOCULAR LENS  IMPLANT, BILATERAL     CHOLECYSTECTOMY, LAPAROSCOPIC     CORONARY STENT INTERVENTION N/A 08/15/2020   Procedure: CORONARY STENT INTERVENTION;  Surgeon: Corky Crafts, MD;  Location: [facility];  Service: Cardiovascular;  Laterality: N/A;   CORONARY STENT INTERVENTION N/A 01/17/2021   Procedure: CORONARY STENT INTERVENTION;  Surgeon: Kathleene Hazel, MD;  Location: [facility];  Service: Cardiovascular;  Laterality: N/A;   INTRAVASCULAR ULTRASOUND/IVUS N/A 08/15/2020   Procedure: Intravascular Ultrasound/IVUS;  Surgeon: Corky Crafts, MD;  Location: [facility] INVASIVE CV LAB;   Service: Cardiovascular;  Laterality: N/A;   LEFT HEART CATH AND CORONARY ANGIOGRAPHY N/A 08/15/2020   Procedure: LEFT HEART CATH AND CORONARY ANGIOGRAPHY;  Surgeon: Corky Crafts, MD;  Location: [facility] INVASIVE CV LAB;  Service: Cardiovascular;  Laterality: N/A;   LEFT HEART CATH AND CORONARY ANGIOGRAPHY N/A 01/17/2021   Procedure: LEFT HEART CATH AND CORONARY ANGIOGRAPHY;  Surgeon: Kathleene Hazel, MD;  Location: [facility];  Service: Cardiovascular;  Laterality: N/A;   LUMBAR FUSION     L3-5 with rods   MICRODISCECTOMY LUMBAR  2006   SPINAL CORD STIMULATOR INSERTION     blood clot removed from spinal cord   SPINE SURGERY     blood clot removed from spinal cord   XI ROBOTIC ASSISTED INGUINAL HERNIA REPAIR WITH MESH     x 3 surgeries   Patient Active Problem List   Diagnosis Date Noted   Radicular pain in right arm 05/02/2022   Neuroforaminal stenosis of cervical spine 05/02/2022   Well adult exam 03/26/2022   SBO (small bowel obstruction) (HCC) 01/09/2022   Small bowel obstruction (HCC) 12/30/2021   Viral gastroenteritis 12/27/2021   AMS (altered mental status) 11/01/2021   Diabetes mellitus (HCC) 11/01/2021   Community acquired pneumonia 10/31/2021  Coughing 10/29/2021   Pain management contract signed 10/11/2021   History of lumbar fusion (L4/5) 10/02/2021   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   Chronic pain syndrome 06/01/2021   OSA (obstructive sleep apnea) 05/28/2021   Thrush 05/21/2021   Metallic taste 05/21/2021   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   Acute pain of left shoulder 04/03/2021   Unstable angina (HCC)    Vertigo 11/28/2020   Insomnia 09/03/2020   Status post coronary artery stent placement    Mixed hyperlipidemia    Coronary artery disease    Chest pain 08/11/2020   Lumbar facet arthropathy 06/22/2020   Major depression in partial remission (HCC) 06/04/2020   Hyperlipidemia associated with type 2 diabetes mellitus (HCC)  04/14/2020   Primary hypertension 04/07/2020   Type 2 diabetes mellitus with other specified complication (HCC) 04/07/2020   Sepsis (HCC) 10/06/2019   Spinal cord stimulator status 05/19/2017   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   Erectile dysfunction 10/09/2015    REFERRING PROVIDER: Edward Jolly  REFERRING DIAG: Cervical radiculopathy, radicular pain in Rt arm  THERAPY DIAG:  Radiculopathy, cervical region  Muscle weakness (generalized)  Abnormal posture  Rationale for Evaluation and Treatment Rehabilitation  ONSET DATE: 02/2022  SUBJECTIVE:                                                                                                                                                                                                         SUBJECTIVE STATEMENT: Pt states he has not had any pain for the past 2-3 days. He continues to have tingling depending on the position of his shoulder and neck  PERTINENT HISTORY:  History of bulging discs in cervical spine per patient, L3-5 fusion with cage  PAIN:  Are you having pain? No -    PATIENT GOALS reduce pain and tingling  OBJECTIVE:   PATIENT SURVEYS:  FOTO 49   CERVICAL ROM:   Active ROM A/PROM (deg) eval  Flexion 50  Extension 35  Right lateral flexion 15 - symptoms  Left lateral flexion 25  Right rotation 40  Left rotation 38   (Blank rows = not tested)    UPPER EXTREMITY MMT:  MMT Right eval Left eval  Shoulder flexion 4/5 4/5  Shoulder extension    Shoulder abduction 4/5 4/5  Shoulder adduction    Shoulder extension    Shoulder internal rotation    Shoulder external rotation    Middle trapezius    Lower trapezius    Elbow flexion    Elbow extension    Wrist flexion  Wrist extension    Wrist ulnar deviation    Wrist radial deviation    Wrist pronation    Wrist supination    Grip strength 70 lbs 75 lbs   (Blank rows = not tested)  CERVICAL SPECIAL TESTS:  Spurling's test: Positive on  Rt ULTT negative on Rt    TODAY'S TREATMENT:  05/21/22 UBE L2 x 4 min alt fwd/bkwd  Doorway stretch 2 x 30 sec Cervical extension with towel 2 x 30 sec SNAG cervical rotation 3 x 20 sec bilat Upper trap stretch 2 x 30 sec bilat  With back against noodle: Bear hug x 10 Bilat ER x 10 Cervical retraction 3 sec hold x 10  Manual: STM cervical paraspinals and upper traps bilat PROM cervical spine all directions  Mechanical cervical traction 0-18lbs x 10 min   05/17/22 SNAG cervical rotation 2 x 10 seconds Cervical extension with towel 2 x 10 seconds Upper trap stretch 2 x 10 sec Cervical retraction 2 x 5 sec Doorway stretch 90 degrees 2 x 30 sec  PATIENT EDUCATION:  Education details: HEP, PT POC and goals Person educated: Patient Education method: Explanation, Demonstration, and Handouts Education comprehension: verbalized understanding and returned demonstration   HOME EXERCISE PROGRAM: Access Code: [email or web address] URL: [email or web address] Date: 05/17/2022 Prepared by: Reggy Eye  Exercises - Seated Assisted Cervical Rotation with Towel  - 1 x daily - 7 x weekly - 1 sets - 10 reps - 10 sec hold - Cervical Extension AROM with Strap  - 1 x daily - 7 x weekly - 1 sets - 10 reps - 10 seconds hold - Seated Upper Trapezius Stretch  - 1 x daily - 7 x weekly - 1 sets - 10 reps - 10 seconds hold - Seated Cervical Retraction  - 1 x daily - 7 x weekly - 1 sets - 10 reps - 3-5 seconds hold - Doorway Pec Stretch at 90 Degrees Abduction  - 1 x daily - 7 x weekly - 1 sets - 3 reps - 20-30 seconds hold  ASSESSMENT:  CLINICAL IMPRESSION: Trial of mechanical traction, pt felt good with the stretching, states the  position caused tingling down Rt UE initially. Good response to postural strengthening   EVALUATION COMPLEXITY: Moderate   GOALS: Goals reviewed with patient? Yes    LONG TERM GOALS: Target date: 06/28/2022  Pt will be independent with HEP  Goal  status: INITIAL  2.  Pt will improve FOTO to >= 64 to demo improved functional mobility Baseline: 49 Goal status: INITIAL  3.  Pt will improve cervical rotation and sidebending ROM by 20 degrees bilat Baseline: see eval Goal status: INITIAL  4.  Pt will tolerate working at computer x 1 hour with no increase in symptoms  Goal status: INITIAL  5.  Pt will improve Rt grip strength to >= 80lbs Baseline: 70 Goal status: INITIAL     PLAN: PT FREQUENCY: 2x/week  PT DURATION: 6 weeks  PLANNED INTERVENTIONS: Therapeutic exercises, Therapeutic activity, Neuromuscular re-education, Balance training, Gait training, Patient/Family education, Self Care, Joint mobilization, Aquatic Therapy, Dry Needling, Electrical stimulation, Cryotherapy, Moist heat, Taping, Traction, Ionotophoresis 4mg /ml Dexamethasone, Manual therapy, and Re-evaluation  PLAN FOR NEXT SESSION: postural strength, cervical ROM, manual traction, needling?   Sharnette Kitamura, PT 05/21/2022, 9:59 AM

## 2022-05-21 NOTE — Telephone Encounter (Signed)
   Name: Brandon [name]  DOB: [birth year]  MRN: [id number]   Primary Cardiologist: Freada Bergeron, MD  Chart reviewed as part of pre-operative protocol coverage. Patient was contacted 05/21/2022 in reference to pre-operative risk assessment for pending surgery as outlined below.  Brandon [name] was last seen on 02/15/22 by Dr. Johney Frame doing well from cardiac perspective and recommended for follow up in 6 months.  Since that day, Brandon [name] has done well. Hx of CAD (PCI-RCA), DM2, HTN, HLD.  Therefore, based on ACC/AHA guidelines, the patient would be at acceptable risk for the planned procedure without further cardiovascular testing.   The patient was advised that if he develops new symptoms prior to surgery to contact our office to arrange for a follow-up visit, and he verbalized understanding.  I will route this recommendation to the requesting party via Epic fax function and remove from pre-op pool. Please call with questions.  Loel Dubonnet, NP 05/21/2022, 3:28 PM

## 2022-05-21 NOTE — Telephone Encounter (Signed)
Carla from [facility] is calling back stating that this patient does not need to hold Plavix any longer per Dr. Antony Blackbird. Does not need a return call.

## 2022-05-22 ENCOUNTER — Encounter (HOSPITAL_COMMUNITY): Payer: Self-pay | Admitting: Otolaryngology

## 2022-05-22 ENCOUNTER — Other Ambulatory Visit: Payer: Self-pay

## 2022-05-22 NOTE — Anesthesia Preprocedure Evaluation (Signed)
Anesthesia Evaluation  Patient identified by MRN, date of birth, ID band Patient awake    Reviewed: Allergy & Precautions, H&P , NPO status , Patient's Chart, lab work & pertinent test results, reviewed documented beta blocker date and time   Airway Mallampati: I  TM Distance: >3 FB Neck ROM: Full    Dental no notable dental hx. (+) Edentulous Upper, Edentulous Lower, Dental Advisory Given   Pulmonary sleep apnea , former smoker,    Pulmonary exam normal breath sounds clear to auscultation       Cardiovascular hypertension, Pt. on medications and Pt. on home beta blockers + angina + CAD and + Cardiac Stents   Rhythm:Regular Rate:Normal     Neuro/Psych Anxiety Depression negative neurological ROS     GI/Hepatic Neg liver ROS, GERD  Medicated,  Endo/Other  diabetes, Insulin Dependent  Renal/GU negative Renal ROS  negative genitourinary   Musculoskeletal  (+) Arthritis , Osteoarthritis,  Fibromyalgia -  Abdominal   Peds  Hematology negative hematology ROS (+)   Anesthesia Other Findings   Reproductive/Obstetrics negative OB ROS                          Anesthesia Physical Anesthesia Plan  ASA: 3  Anesthesia Plan: MAC   Post-op Pain Management: Minimal or no pain anticipated   Induction: Intravenous  PONV Risk Score and Plan: 1 and Propofol infusion  Airway Management Planned: Nasal Cannula and Natural Airway  Additional Equipment:   Intra-op Plan:   Post-operative Plan:   Informed Consent: I have reviewed the patients History and Physical, chart, labs and discussed the procedure including the risks, benefits and alternatives for the proposed anesthesia with the patient or authorized representative who has indicated his/her understanding and acceptance.     Dental advisory given  Plan Discussed with: CRNA  Anesthesia Plan Comments: (PAT note written 05/22/2022 by Myra Gianotti, PA-C. )       Anesthesia Quick Evaluation

## 2022-05-22 NOTE — Progress Notes (Signed)
PCP - Luetta Nutting, DO Cardiologist - Dr Gwyndolyn Kaufman (clearance in Epic on 05/21/22 Endocrinology - Dr Jerrye Bushy Pain Mgmt - Dr Gillis Santa  Chest x-ray - 12/29/21 EKG - 02/15/22 Stress Test - n/a ECHO - 11/01/21 Cardiac Cath - 01/17/21  ICD Pacemaker/Loop - n/a  Sleep Study -  Yes CPAP - does not use CPAP  Diabetes Type 2 - Patient has a Dexcom G6 Sensor located on the right lower quadrant.  Insulin Pump (Humalog) - reduce basil rate by 20% at midnight tonight (Wed).   Do not take Jardiance and Ozempic today (05/22/22) and on the morning of surgery.  SDW call on 05/22/22.  If your blood sugar is less than 70 mg/dL, you will need to treat for low blood sugar: Treat a low blood sugar (less than 70 mg/dL) with  cup of clear juice (cranberry or apple), 4 glucose tablets, OR glucose gel. Recheck blood sugar in 15 minutes after treatment (to make sure it is greater than 70 mg/dL). If your blood sugar is not greater than 70 mg/dL on recheck, call [phone] for further instructions.  Blood Thinner/Aspirin Instructions:  Per Dr Wilburn Cornelia, patient does not need to hold Plavix/ASA.  See telephone note placed in chart.    ERAS: Clear liquids til 9:15 AM DOS.  Anesthesia review: Yes  STOP now taking any Aspirin (unless otherwise instructed by your surgeon), Aleve, Naproxen, Ibuprofen, Motrin, Advil, Goody's, BC's, all herbal medications, fish oil, and all vitamins.   Coronavirus Screening Do you have any of the following symptoms:  Cough yes/no: No Fever (>100.34F)  yes/no: No Runny nose yes/no: No Sore throat yes/no: No Difficulty breathing/shortness of breath  yes/no: No  Have you traveled in the last 14 days and where? yes/no: No  Patient verbalized understanding of instructions that were given via phone.

## 2022-05-22 NOTE — Progress Notes (Signed)
Anesthesia Chart Review: Brandon [name]  Case: [id number] Date/Time: 05/23/22 1200   Procedure: DRUG INDUCED SLEEP ENDOSCOPY   Anesthesia type: General   Pre-op diagnosis: obstructive sleep apnea   Location: [facility] OR ROOM 12 / [place] [facility]   Surgeons: Jerrell Belfast, MD       DISCUSSION: Patient is a 66 year old male scheduled for the above procedure.  History includes former smoker (quit 08/26/05), HTN, DM2, CAD (DES mid RCA 08/15/20; DES proximal CX, DES mid CX 01/17/21), HLD, GERD, OSA, fibromyalgia, hepatitis B, chronic back pain, spinal surgery (lumbar microdiscectomy 2006, lumbar fusion, spinal cord stimulator 05/19/2017), SBO (12/2021).   Preoperative cardiology input outlined by Laurann Montana, NP on 05/21/22: "Brandon [name] was last seen on 02/15/22 by Dr. Johney Frame doing well from cardiac perspective and recommended for follow up in 6 months.  Since that day, Brandon [name] has done well. Hx of CAD (PCI-RCA), DM2, HTN, HLD.   Therefore, based on ACC/AHA guidelines, the patient would be at acceptable risk for the planned procedure without further cardiovascular testing." Per Dr. Wilburn Cornelia, patient does not need to hold Plavix for procedure.   He is a same-day work-up, so anesthesia team to evaluate on the day of surgery.   VS: Ht 5\' 8"  (1.727 m)   Wt 91.2 kg   BMI 30.57 kg/m  BP Readings from Last 3 Encounters:  05/13/22 118/72  05/02/22 121/69  03/26/22 104/68   Pulse Readings from Last 3 Encounters:  05/13/22 79  05/02/22 84  03/26/22 81     PROVIDERS: Luetta Nutting, DO is PCP Gwyndolyn Kaufman, MD is cardiologist Philemon Kingdom, MD is endocrinologist   LABS: For day of surgery as indicated.  Most recent lab results in [facility] include: Lab Results  Component Value Date   WBC 5.0 03/26/2022   HGB 15.5 03/26/2022   HCT 45.2 03/26/2022   PLT 191 03/26/2022   GLUCOSE 134 (H) 03/26/2022   ALT 12 03/26/2022   AST 19 03/26/2022   NA 141 03/26/2022   K 4.2 03/26/2022    CL 104 03/26/2022   CREATININE 0.82 03/26/2022   BUN 12 03/26/2022   CO2 29 03/26/2022   PSA 0.70 03/26/2022   INR 1.3 (H) 10/31/2021   HGBA1C 6.1 (A) 05/13/2022   MICROALBUR 30 03/26/2022    IMAGES: CXR 12/29/21: FINDINGS: The heart size and mediastinal contours are within normal limits. No focal airspace consolidation, pleural effusion, or pneumothorax. The visualized skeletal structures are unremarkable. Interval placement of thoracic spinal stimulator with leads in the mid to lower thoracic spine. IMPRESSION: No active cardiopulmonary disease.   EKG: 02/15/22: NSR   CV: Echo 11/01/21: IMPRESSIONS   1. Left ventricular ejection fraction, by estimation, is 60 to 65%. The  left ventricle has normal function. The left ventricle has no regional  wall motion abnormalities. There is moderate concentric left ventricular  hypertrophy. Left ventricular  diastolic parameters are consistent with Grade I diastolic dysfunction  (impaired relaxation).   2. Right ventricular systolic function is normal. The right ventricular  size is normal. Tricuspid regurgitation signal is inadequate for assessing  PA pressure.   3. The mitral valve is normal in structure. No evidence of mitral valve  regurgitation. No evidence of mitral stenosis.   4. The aortic valve is tricuspid. Aortic valve regurgitation is not  visualized.   5. The inferior vena cava is normal in size with greater than 50%  respiratory variability, suggesting right atrial pressure of 3 mmHg.  - Comparison(s):  No significant change from prior study.    Cardiac cath 01/17/2021: 2nd Diag-2 lesion is 75% stenosed. 2nd Diag-1 lesion is 75% stenosed. Dist LAD lesion is 50% stenosed. 2nd Mrg lesion is 75% stenosed. 1st Mrg lesion is 100% stenosed. RPDA lesion is 70% stenosed. Previously placed Mid RCA stent (unknown type) is widely patent. Dist RCA lesion is 50% stenosed. Ost RCA to Prox RCA lesion is 30% stenosed. Mid Cx  lesion is 90% stenosed. Ost Cx to Prox Cx lesion is 75% stenosed. A drug-eluting stent was successfully placed using a STENT RESOLUTE ONYX 3.5X15. Post intervention, there is a 0% residual stenosis. A drug-eluting stent was successfully placed using a STENT RESOLUTE ONYX 2.25X8. Post intervention, there is a 0% residual stenosis.   1. Non-obstructive disease in the LAD. Diffuse disease in the small to moderate caliber Diagonal branch, unchanged from last cath and not a good target for PCI.  2. Severe proximal Circumflex stenosis. Severe mid Circumflex stenosis. Chronic occlusion OM1. Severe diffuse disease in the small caliber second OM branch.  4. Patent proximal to mid RCA stent. Severe disease in the small caliber PDA, unchanged from last cath.  5. Successful PTCA/DES x 1 proximal Circumflex 6. Successful PTCA/DES x 1 mid Circumflex   Recommendations: Continue ASA/Plavix for at least one year.    Past Medical History:  Diagnosis Date   Anxiety    Arthritis    Basal cell carcinoma (BCC) in situ of skin    Chronic back pain    has spinal cord stimulator and wears buprenorphine patch   Colon polyps    Coronary artery disease    S/p DES to mRCA in 12/21 // S/p DES to LCx x 2 in 12/2020 // Diff dz in small to mod Dx (not a good target for PCI); OM1 100 CTO; severe dz in small OM2 and PDA >> Med Rx   Depression    Diabetes mellitus without complication (HCC)    Fibromyalgia    Gallstones    GERD (gastroesophageal reflux disease)    Hepatitis B    HLD (hyperlipidemia)    Hypertension    Osteoarthritis    Pneumonia    SBO (small bowel obstruction) ([place])    Sleep apnea     Past Surgical History:  Procedure Laterality Date   CARDIAC CATHETERIZATION  01/17/2021   CATARACT EXTRACTION W/ INTRAOCULAR LENS  IMPLANT, BILATERAL     CHOLECYSTECTOMY, LAPAROSCOPIC     CORONARY STENT INTERVENTION N/A 08/15/2020   Procedure: CORONARY STENT INTERVENTION;  Surgeon: Jettie Booze, MD;   Location: [place] [facility];  Service: Cardiovascular;  Laterality: N/A;   CORONARY STENT INTERVENTION N/A 01/17/2021   Procedure: CORONARY STENT INTERVENTION;  Surgeon: Burnell Blanks, MD;  Location: [place] [facility];  Service: Cardiovascular;  Laterality: N/A;   INTRAVASCULAR ULTRASOUND/IVUS N/A 08/15/2020   Procedure: Intravascular Ultrasound/IVUS;  Surgeon: Jettie Booze, MD;  Location: [place] [facility];  Service: Cardiovascular;  Laterality: N/A;   LEFT HEART CATH AND CORONARY ANGIOGRAPHY N/A 08/15/2020   Procedure: LEFT HEART CATH AND CORONARY ANGIOGRAPHY;  Surgeon: Jettie Booze, MD;  Location: [place] [facility];  Service: Cardiovascular;  Laterality: N/A;   LEFT HEART CATH AND CORONARY ANGIOGRAPHY N/A 01/17/2021   Procedure: LEFT HEART CATH AND CORONARY ANGIOGRAPHY;  Surgeon: Burnell Blanks, MD;  Location: [place] [facility];  Service: Cardiovascular;  Laterality: N/A;   LUMBAR FUSION     L3-5 with rods   MICRODISCECTOMY LUMBAR  2006   SPINAL CORD STIMULATOR INSERTION     blood clot removed from spinal cord   SPINE SURGERY     blood clot removed from spinal cord   XI ROBOTIC ASSISTED INGUINAL HERNIA REPAIR WITH MESH     x 3 surgeries    MEDICATIONS: No current facility-administered medications for this encounter.    acetaminophen (TYLENOL) 500 MG tablet   ARIPiprazole (ABILIFY) 10 MG tablet   aspirin 81 MG EC tablet   b complex vitamins capsule   buprenorphine (BUTRANS) 15 MCG/HR   buPROPion (WELLBUTRIN XL) 300 MG 24 hr tablet   clopidogrel (PLAVIX) 75 MG tablet   docusate sodium (COLACE) 100 MG capsule   empagliflozin (JARDIANCE) 25 MG TABS tablet   GVOKE HYPOPEN 1-PACK 1 MG/0.2ML SOAJ   insulin lispro (HUMALOG) 100 UNIT/ML injection   Iron, Ferrous Sulfate, 325 (65 Fe) MG TABS   isosorbide mononitrate (IMDUR) 30 MG 24 hr tablet   magnesium oxide (MAG-OX) 400 (240 Mg) MG tablet   metoCLOPramide (REGLAN) 10 MG tablet   metoprolol  succinate (TOPROL XL) 25 MG 24 hr tablet   nitroGLYCERIN (NITROSTAT) 0.4 MG SL tablet   ondansetron (ZOFRAN-ODT) 4 MG disintegrating tablet   pantoprazole (PROTONIX) 40 MG tablet   Semaglutide, 2 MG/DOSE, (OZEMPIC, 2 MG/DOSE,) 8 MG/3ML SOPN   tamsulosin (FLOMAX) 0.4 MG CAPS capsule   tiZANidine (ZANAFLEX) 4 MG tablet   albuterol (VENTOLIN HFA) 108 (90 Base) MCG/ACT inhaler   atorvastatin (LIPITOR) 80 MG tablet   Continuous Blood Gluc Sensor (DEXCOM G6 SENSOR) MISC   lidocaine (LIDODERM) 5 %   Na Sulfate-K Sulfate-Mg Sulf 17.5-3.13-1.6 GM/177ML SOLN   testosterone (ANDROGEL) 50 MG/5GM (1%) GEL    Myra Gianotti, PA-C Surgical Short Stay/Anesthesiology [facility] Phone [phone] [facility] Phone [phone] 05/22/2022 11:10 AM

## 2022-05-23 ENCOUNTER — Ambulatory Visit (HOSPITAL_BASED_OUTPATIENT_CLINIC_OR_DEPARTMENT_OTHER): Payer: No Typology Code available for payment source | Admitting: Vascular Surgery

## 2022-05-23 ENCOUNTER — Encounter (HOSPITAL_COMMUNITY)
Admission: RE | Disposition: A | Discharge status: Home / Self Care | Payer: Self-pay | Source: Home / Self Care | Attending: Otolaryngology

## 2022-05-23 ENCOUNTER — Ambulatory Visit (HOSPITAL_COMMUNITY)
Admission: RE | Admit: 2022-05-23 | Discharge: 2022-05-23 | Disposition: A | Discharge status: Home / Self Care | Payer: No Typology Code available for payment source | Attending: Otolaryngology | Admitting: Otolaryngology

## 2022-05-23 ENCOUNTER — Ambulatory Visit (HOSPITAL_COMMUNITY): Payer: No Typology Code available for payment source | Admitting: Vascular Surgery

## 2022-05-23 ENCOUNTER — Encounter (HOSPITAL_COMMUNITY): Payer: Self-pay | Admitting: Otolaryngology

## 2022-05-23 DIAGNOSIS — Z7902 Long term (current) use of antithrombotics/antiplatelets: Secondary | ICD-10-CM | POA: Insufficient documentation

## 2022-05-23 DIAGNOSIS — Z87891 Personal history of nicotine dependence: Secondary | ICD-10-CM | POA: Diagnosis not present

## 2022-05-23 DIAGNOSIS — Z79890 Hormone replacement therapy (postmenopausal): Secondary | ICD-10-CM | POA: Insufficient documentation

## 2022-05-23 DIAGNOSIS — E119 Type 2 diabetes mellitus without complications: Secondary | ICD-10-CM | POA: Diagnosis not present

## 2022-05-23 DIAGNOSIS — I10 Essential (primary) hypertension: Secondary | ICD-10-CM | POA: Diagnosis not present

## 2022-05-23 DIAGNOSIS — Z79899 Other long term (current) drug therapy: Secondary | ICD-10-CM | POA: Diagnosis not present

## 2022-05-23 DIAGNOSIS — K219 Gastro-esophageal reflux disease without esophagitis: Secondary | ICD-10-CM | POA: Insufficient documentation

## 2022-05-23 DIAGNOSIS — I2510 Atherosclerotic heart disease of native coronary artery without angina pectoris: Secondary | ICD-10-CM | POA: Insufficient documentation

## 2022-05-23 DIAGNOSIS — Z7984 Long term (current) use of oral hypoglycemic drugs: Secondary | ICD-10-CM | POA: Diagnosis not present

## 2022-05-23 DIAGNOSIS — G4733 Obstructive sleep apnea (adult) (pediatric): Secondary | ICD-10-CM

## 2022-05-23 DIAGNOSIS — Z955 Presence of coronary angioplasty implant and graft: Secondary | ICD-10-CM | POA: Insufficient documentation

## 2022-05-23 DIAGNOSIS — F418 Other specified anxiety disorders: Secondary | ICD-10-CM | POA: Diagnosis not present

## 2022-05-23 DIAGNOSIS — Z794 Long term (current) use of insulin: Secondary | ICD-10-CM | POA: Diagnosis not present

## 2022-05-23 DIAGNOSIS — M797 Fibromyalgia: Secondary | ICD-10-CM | POA: Diagnosis not present

## 2022-05-23 DIAGNOSIS — M1990 Unspecified osteoarthritis, unspecified site: Secondary | ICD-10-CM | POA: Insufficient documentation

## 2022-05-23 DIAGNOSIS — I25119 Atherosclerotic heart disease of native coronary artery with unspecified angina pectoris: Secondary | ICD-10-CM | POA: Diagnosis not present

## 2022-05-23 HISTORY — PX: DRUG INDUCED ENDOSCOPY: SHX6808

## 2022-05-23 LAB — COMPREHENSIVE METABOLIC PANEL
ALT: 13 U/L (ref 0–44)
AST: 25 U/L (ref 15–41)
Albumin: 4.1 g/dL (ref 3.5–5.0)
Alkaline Phosphatase: 61 U/L (ref 38–126)
Anion gap: 8 (ref 5–15)
BUN: 15 mg/dL (ref 8–23)
CO2: 25 mmol/L (ref 22–32)
Calcium: 8.9 mg/dL (ref 8.9–10.3)
Chloride: 107 mmol/L (ref 98–111)
Creatinine, Ser: 0.88 mg/dL (ref 0.61–1.24)
GFR, Estimated: >60 mL/min (ref >60)
Glucose, Bld: 157 mg/dL — ABNORMAL HIGH (ref 70–99)
Potassium: 4.2 mmol/L (ref 3.5–5.1)
Sodium: 140 mmol/L (ref 135–145)
Total Bilirubin: 1 mg/dL (ref 0.3–1.2)
Total Protein: 6.5 g/dL (ref 6.5–8.1)

## 2022-05-23 LAB — CBC
HCT: 47.5 % (ref 39.0–52.0)
Hemoglobin: 16 g/dL (ref 13.0–17.0)
MCH: 32.7 pg (ref 26.0–34.0)
MCHC: 33.7 g/dL (ref 30.0–36.0)
MCV: 97.1 fL (ref 80.0–100.0)
Platelets: 179 10*3/uL (ref 150–400)
RBC: 4.89 MIL/uL (ref 4.22–5.81)
RDW: 11.9 % (ref 11.5–15.5)
WBC: 4.7 10*3/uL (ref 4.0–10.5)
nRBC: 0 % (ref 0.0–0.2)

## 2022-05-23 LAB — GLUCOSE, CAPILLARY
Glucose-Capillary: 166 mg/dL — ABNORMAL HIGH (ref 70–99)
Glucose-Capillary: 101 mg/dL — ABNORMAL HIGH (ref 70–99)

## 2022-05-23 SURGERY — DRUG INDUCED SLEEP ENDOSCOPY
Anesthesia: Monitor Anesthesia Care | Site: Mouth

## 2022-05-23 MED ORDER — OXYMETAZOLINE HCL 0.05 % NA SOLN
NASAL | Status: AC
  Filled 2022-05-23: qty 30

## 2022-05-23 MED ORDER — LIDOCAINE 2% (20 MG/ML) 5 ML SYRINGE
INTRAMUSCULAR | Status: AC
  Filled 2022-05-23: qty 10

## 2022-05-23 MED ORDER — ORAL CARE MOUTH RINSE: 15.0000 mL | Freq: Once | OROMUCOSAL | Status: AC

## 2022-05-23 MED ORDER — DEXAMETHASONE SODIUM PHOSPHATE 10 MG/ML IJ SOLN
INTRAMUSCULAR | Status: AC
  Filled 2022-05-23: qty 1

## 2022-05-23 MED ORDER — LIDOCAINE 2% (20 MG/ML) 5 ML SYRINGE
INTRAMUSCULAR | Status: DC | PRN
  Administered 2022-05-23: 60 mg via INTRAVENOUS

## 2022-05-23 MED ORDER — LACTATED RINGERS IV SOLN: INTRAVENOUS | Status: DC

## 2022-05-23 MED ORDER — FENTANYL CITRATE (PF) 250 MCG/5ML IJ SOLN
INTRAMUSCULAR | Status: AC
  Filled 2022-05-23: qty 5

## 2022-05-23 MED ORDER — PROPOFOL 500 MG/50ML IV EMUL
INTRAVENOUS | Status: DC | PRN
  Administered 2022-05-23: 75 ug/kg/min via INTRAVENOUS

## 2022-05-23 MED ORDER — PROPOFOL 10 MG/ML IV BOLUS
INTRAVENOUS | Status: AC
  Filled 2022-05-23: qty 20

## 2022-05-23 MED ORDER — CHLORHEXIDINE GLUCONATE 0.12 % MT SOLN
15.0000 mL | Freq: Once | OROMUCOSAL | Status: AC
  Administered 2022-05-23: 15 mL via OROMUCOSAL
  Filled 2022-05-23: qty 15

## 2022-05-23 MED ORDER — MIDAZOLAM HCL 2 MG/2ML IJ SOLN
INTRAMUSCULAR | Status: AC
  Filled 2022-05-23: qty 2

## 2022-05-23 MED ORDER — FENTANYL CITRATE (PF) 100 MCG/2ML IJ SOLN: 25.0000 ug | INTRAMUSCULAR | Status: DC | PRN

## 2022-05-23 MED ORDER — SUCCINYLCHOLINE CHLORIDE 200 MG/10ML IV SOSY
PREFILLED_SYRINGE | INTRAVENOUS | Status: AC
  Filled 2022-05-23: qty 10

## 2022-05-23 MED ORDER — LIDOCAINE-EPINEPHRINE 1 %-1:100000 IJ SOLN
INTRAMUSCULAR | Status: AC
  Filled 2022-05-23: qty 1

## 2022-05-23 MED ORDER — PROPOFOL 10 MG/ML IV BOLUS
INTRAVENOUS | Status: DC | PRN
  Administered 2022-05-23 (×2): 20 mg via INTRAVENOUS

## 2022-05-23 MED ORDER — ONDANSETRON HCL 4 MG/2ML IJ SOLN
INTRAMUSCULAR | Status: AC
  Filled 2022-05-23: qty 2

## 2022-05-23 SURGICAL SUPPLY — 15 items
BAG COUNTER SPONGE SURGICOUNT (BAG) IMPLANT
BAG SPNG CNTER NS LX DISP (BAG)
CANISTER SUCT 1200ML W/VALVE (MISCELLANEOUS) ×1 IMPLANT
GLOVE BIOGEL M 7.0 STRL (GLOVE) ×1 IMPLANT
KIT BASIN OR (CUSTOM PROCEDURE TRAY) ×1 IMPLANT
NDL PRECISIONGLIDE 27X1.5 (NEEDLE) IMPLANT
NEEDLE PRECISIONGLIDE 27X1.5 (NEEDLE) IMPLANT
PATTIES SURGICAL .5 X3 (DISPOSABLE) IMPLANT
SHEET MEDIUM DRAPE 40X70 STRL (DRAPES) ×1 IMPLANT
SOL ANTI FOG 6CC (MISCELLANEOUS) ×1 IMPLANT
SOLUTION ANTI FOG 6CC (MISCELLANEOUS) ×1
SPONGE NEURO XRAY DETECT 1X3 (DISPOSABLE) IMPLANT
SYR CONTROL 10ML LL (SYRINGE) IMPLANT
TOWEL GREEN STERILE FF (TOWEL DISPOSABLE) ×1 IMPLANT
TUBE CONNECTING 20X1/4 (TUBING) IMPLANT

## 2022-05-23 NOTE — Anesthesia Postprocedure Evaluation (Signed)
Anesthesia Post Note  Patient: Brandon [name]  Procedure(s) Performed: DRUG INDUCED SLEEP ENDOSCOPY (Mouth)     Patient location during evaluation: PACU Anesthesia Type: MAC Level of consciousness: awake and alert Pain management: pain level controlled Vital Signs Assessment: post-procedure vital signs reviewed and stable Respiratory status: spontaneous breathing, nonlabored ventilation and respiratory function stable Cardiovascular status: stable and blood pressure returned to baseline Postop Assessment: no apparent nausea or vomiting Anesthetic complications: no   No notable events documented.  Last Vitals:  Vitals:   05/23/22 1319 05/23/22 1330  BP: 102/61 99/63  Pulse: 71 68  Resp: 17 16  Temp:  (!) 36.2 C  SpO2: 96% 95%    Last Pain:  Vitals:   05/23/22 1330  TempSrc:   PainSc: 0-No pain                 Brinna Divelbiss,W. EDMOND

## 2022-05-23 NOTE — Op Note (Signed)
Operative Note: DRUG INDUCED SLEEP ENDOSCOPY  Patient: Brandon [name]  Medical record number: [id number]  Date:05/23/2022  Pre-operative Indications: 1.  Obstructive Sleep Apnea  Postoperative Indications: Same  Surgical Procedure: 1.  Drug Induced Sleep Endoscopy (DISE)  Anesthesia: MAC with IV sedation  Surgeon: Delsa Bern, M.D.  Complications: None  BMI: 29.0 kg/m2   EBL: None  Findings: There is no evidence of complete concentric palatal obstruction.  Anatomically the patient should be a candidate for hypoglossal nerve stimulation therapy.     Brief History: The patient is a 66 y.o. male with a history of obstructive sleep apnea. The patient has undergone previous sleep study which showed mild to moderate levels of obstructive sleep apnea.  The patient was prescribed CPAP which they consistently attempted to use without success.  Given the patient's history and findings, the above drug-induced sleep endoscopy was recommended to assess the patient's anatomic level of apnea.  Risks and benefits were discussed in detail with the patient today understand and agree with our plan for surgery which is scheduled at [facility] under sedated anesthesia as an outpatient.  Surgical Procedure: The patient is brought to the operating room on 05/23/2022 and placed in supine position on the operating table.  Intravenous sedated anesthesia was established without difficulty using the standard drug-induced sleep endoscopy protocol. When the patient was adequately anesthetized, surgical timeout was performed and correct identification of the patient and the surgical procedure.    A propofol infusion was administered and the patient was monitored carefully to achieve a level of sedation appropriate for DISE.  The patient did not respond to verbal commands but still had spontaneous respiration, sleep disordered breathing and associated desaturations were observed.  With the patient under adequate  sedated anesthesia the flexible nasal laryngoscope was passed without difficulty.  The patient's nasal cavity showed no obstruction.  The endoscope was then passed to visualize the velopharynx, oropharynx, tongue base and epiglottis to assess areas of obstruction.  Patient's airway showed anterior to posterior obstruction at palate and tongue base.   There was no evidence of complete concentric palatal obstruction and the patient appeared to be a candidate anatomically for hypoglossal nerve stimulation therapy.  Surgical sponge count was correct. Patient was awakened from anesthetic and transferred from the operating room to the recovery room in stable condition. There were no complications and no blood loss.   Delsa Bern, M.D. [facility] 05/23/2022

## 2022-05-23 NOTE — H&P (Signed)
Brandon [name] is an 66 y.o. male.   Chief Complaint: OSA HPI: Hx of OSA unable to tolerate CPAP  Past Medical History:  Diagnosis Date   Anxiety    Arthritis    Basal cell carcinoma (BCC) in situ of skin    left neck   Chronic back pain    has spinal cord stimulator and wears buprenorphine patch   Colon polyps    Coronary artery disease    S/p DES to mRCA in 12/21 // S/p DES to LCx x 2 in 12/2020 // Diff dz in small to mod Dx (not a good target for PCI); OM1 100 CTO; severe dz in small OM2 and PDA >> Med Rx   Depression    Diabetes mellitus without complication (HCC)    type 2   Fibromyalgia    Gallstones    GERD (gastroesophageal reflux disease)    Hepatitis B    HLD (hyperlipidemia)    Hypertension    Osteoarthritis    Pneumonia    SBO (small bowel obstruction) (HCC)    Sleep apnea    does not use CPAP    Past Surgical History:  Procedure Laterality Date   CARDIAC CATHETERIZATION  01/17/2021   CATARACT EXTRACTION W/ INTRAOCULAR LENS  IMPLANT, BILATERAL     CHOLECYSTECTOMY, LAPAROSCOPIC     CORONARY STENT INTERVENTION N/A 08/15/2020   Procedure: CORONARY STENT INTERVENTION;  Surgeon: Corky Crafts, MD;  [facility];  Service: Cardiovascular;  Laterality: N/A;   CORONARY STENT INTERVENTION N/A 01/17/2021   Procedure: CORONARY STENT INTERVENTION;  Surgeon: Kathleene Hazel, MD;  [facility]: [facility];  Service: Cardiovascular;  Laterality: N/A;   HERNIA REPAIR     INTRAVASCULAR ULTRASOUND/IVUS N/A 08/15/2020   Procedure: Intravascular Ultrasound/IVUS;  Surgeon: Corky Crafts, MD;  Location: [facility];  Service: Cardiovascular;  Laterality: N/A;   LEFT HEART CATH AND CORONARY ANGIOGRAPHY N/A 08/15/2020   Procedure: LEFT HEART CATH AND CORONARY ANGIOGRAPHY;  Surgeon: Corky Crafts, MD;  Location: [facility] IN[facility]ASIVE [facility] LAB;  Service: Cardiovascular;  Laterality: N/A;   LEFT HEART CATH AND CORONARY ANGIOGRAPHY N/A  01/17/2021   Procedure: LEFT HEART CATH AND CORONARY ANGIOGRAPHY;  Surgeon: Kathleene Hazel, MD;  [facility];  Service: Cardiovascular;  Laterality: N/A;   LUMBAR FUSION     L3-5 with rods   MICRODISCECTOMY LUMBAR  2006   SPINAL CORD STIMULATOR INSERTION     blood clot removed from spinal cord   SPINE SURGERY     blood clot removed from spinal cord   XI ROBOTIC ASSISTED INGUINAL HERNIA REPAIR WITH MESH     x 3 surgeries    Family History  Problem Relation Age of Onset   COPD Mother    Anxiety disorder Mother    Depression Mother    Colon cancer Mother    Cancer Father        mets, origin unknown   Anxiety disorder Father    Depression Father    Coronary artery disease Father    CAD Brother    Leukemia Maternal Grandmother    Multiple sclerosis Daughter    Diabetes Neg Hx    Social History:  reports that he quit smoking about 16 years ago. His smoking use included cigarettes. He has a 30.00 pack-year smoking history. He has never used smokeless tobacco. He reports that he does not currently use alcohol. He reports that he does not currently  use drugs.  Allergies:  Allergies  Allergen Reactions   Kiwi Extract Swelling    Mouth swelling.    Other Swelling and Other (See Comments)    EGGPLANT. Mouth swelling.   Penicillins Rash    Reaction: unknown   Ancef [Cefazolin] Rash   Latex Rash   Levaquin [Levofloxacin] Rash   Peach [Prunus Persica] Other (See Comments)    Burns mouth    Medications Prior to Admission  Medication Sig Dispense Refill   acetaminophen (TYLENOL) 500 MG tablet Take 1,000 mg by mouth See admin instructions. Take 2 tablets (1000 mg) by mouth scheduled in the morning & may take 2 tablets (1000 mg) by mouth every 6 hours if needed for pain.     ARIPiprazole (ABILIFY) 10 MG tablet Take 1 tablet (10 mg total) by mouth every morning. 90 tablet 1   aspirin 81 MG EC tablet Take 81 mg by mouth every morning.     atorvastatin  (LIPITOR) 80 MG tablet Take 1 tablet (80 mg total) by mouth daily. 90 tablet 2   b complex vitamins capsule Take 1 capsule by mouth in the morning.     buprenorphine (BUTRANS) 15 MCG/HR Place 1 patch onto the skin once a week. (Patient taking differently: Place 1 patch onto the skin every Friday.) 4 patch 2   buPROPion (WELLBUTRIN XL) 300 MG 24 hr tablet Take 1 tablet (300 mg total) by mouth daily. 90 tablet 0   clopidogrel (PLAVIX) 75 MG tablet TAKE 1 TABLET (75 MG TOTAL) BY MOUTH DAILY WITH BREAKFAST. 90 tablet 3   docusate sodium (COLACE) 100 MG capsule Take 200 mg by mouth in the morning.     empagliflozin (JARDIANCE) 25 MG TABS tablet Take 1 tablet (25 mg total) by mouth daily. 90 tablet 3   GVOKE HYPOPEN 1-PACK 1 MG/0.2ML SOAJ Use as needed for hypoglycemia 0.2 mL PRN   insulin lispro (HUMALOG) 100 UNIT/ML injection For use in pump, total of 60 units per day 60 mL 3   Iron, Ferrous Sulfate, 325 (65 Fe) MG TABS Take 1 tablet (325 mg) by mouth daily. 100 tablet 1   isosorbide mononitrate (IMDUR) 30 MG 24 hr tablet Take 1 tablet (30 mg total) by mouth daily. 90 tablet 2   magnesium oxide (MAG-OX) 400 (240 Mg) MG tablet Take 800 mg by mouth in the morning.     metoprolol succinate (TOPROL XL) 25 MG 24 hr tablet Take 1 tablet (25 mg total) by mouth daily. 90 tablet 2   nitroGLYCERIN (NITROSTAT) 0.4 MG SL tablet Place 0.4 mg under the tongue every 5 (five) minutes x 3 doses as needed for chest pain.     pantoprazole (PROTONIX) 40 MG tablet Take 1 tablet (40 mg total) by mouth daily. 90 tablet 0   Semaglutide, 2 MG/DOSE, (OZEMPIC, 2 MG/DOSE,) 8 MG/3ML SOPN Inject 2 mg into the skin once a week. (Patient taking differently: Inject 2 mg into the skin every Wednesday.) 9 mL 1   tamsulosin (FLOMAX) 0.4 MG CAPS capsule Take 1 capsule (0.4 mg total) by mouth daily. 90 capsule 1   testosterone (ANDROGEL) 50 MG/5GM (1%) GEL Place 5 g (1 packet) onto the shoulders and upper arms daily as directed. 150 g 1    tiZANidine (ZANAFLEX) 4 MG tablet Take 1-1&1/2 tablets (4-6 mg total) by mouth every 8 (eight) hours as needed for muscle spasms. (Patient taking differently: Take 8 mg by mouth in the morning.) 90 tablet 3   albuterol (VENTOLIN  HFA) 108 (90 Base) MCG/ACT inhaler Inhale 2 puffs by mouth  into the lungs every 6 (six) hours as needed for wheezing. (Patient not taking: Reported on 05/17/2022) 17 g 11   Continuous Blood Gluc Sensor (DEXCOM G6 SENSOR) MISC Use as directed. Change sensor every 10 days 9 each 3   lidocaine (LIDODERM) 5 % Apply 1 - 3 patches on to the skin as directed. 12 hours on and 12 hours off. (Patient not taking: Reported on 05/17/2022) 90 patch 2   metoCLOPramide (REGLAN) 10 MG tablet Take 10 mg by mouth daily as needed for nausea or vomiting (upset stomach).     Na Sulfate-K Sulfate-Mg Sulf 17.5-3.13-1.6 GM/177ML SOLN Take by mouth as directed 354 mL 0   ondansetron (ZOFRAN-ODT) 4 MG disintegrating tablet Take 1 tablet (4 mg total) by mouth every 8 (eight) hours as needed for nausea or vomiting. 20 tablet 0    Results for orders placed or performed during the hospital encounter of 05/23/22 (from the past 48 hour(s))  Glucose, capillary     Status: Abnormal   Collection Time: 05/23/22 10:16 AM  Result Value Ref Range   Glucose-Capillary 166 (H) 70 - 99 mg/dL    Comment: Glucose reference range applies only to samples taken after fasting for at least 8 hours.  CBC per protocol     Status: None   Collection Time: 05/23/22 10:55 AM  Result Value Ref Range   WBC 4.7 4.0 - 10.5 K/uL   RBC 4.89 4.22 - 5.81 MIL/uL   Hemoglobin 16.0 13.0 - 17.0 g/dL   HCT 83.3 82.5 - 05.3 %   MCV 97.1 80.0 - 100.0 fL   MCH 32.7 26.0 - 34.0 pg   MCHC 33.7 30.0 - 36.0 g/dL   RDW 97.6 73.4 - 19.3 %   Platelets 179 150 - 400 K/uL   nRBC 0.0 0.0 - 0.2 %    Comment: Performed at [facility], [address], [place], [place] [address]  Comprehensive metabolic panel per protocol     Status: Abnormal    Collection Time: 05/23/22 10:55 AM  Result Value Ref Range   Sodium 140 135 - 145 mmol/L   Potassium 4.2 3.5 - 5.1 mmol/L   Chloride 107 98 - 111 mmol/L   CO2 25 22 - 32 mmol/L   Glucose, Bld 157 (H) 70 - 99 mg/dL    Comment: Glucose reference range applies only to samples taken after fasting for at least 8 hours.   BUN 15 8 - 23 mg/dL   Creatinine, Ser 0.97 0.61 - 1.24 mg/dL   Calcium 8.9 8.9 - 35.3 mg/dL   Total Protein 6.5 6.5 - 8.1 g/dL   Albumin 4.1 3.5 - 5.0 g/dL   AST 25 15 - 41 U/L   ALT 13 0 - 44 U/L   Alkaline Phosphatase 61 38 - 126 U/L   Total Bilirubin 1.0 0.3 - 1.2 mg/dL   GFR, Estimated >29 >92 mL/min    Comment: (NOTE) Calculated using the CKD-EPI Creatinine Equation (2021)    Anion gap 8 5 - 15    Comment: Performed at [facility], [address], [place], [place] [address]   No results found.  Review of Systems  Respiratory:  Positive for apnea.     Blood pressure 120/62, pulse 71, temperature 97.8 F (36.6 C), temperature source Oral, resp. rate 17, height 5\' 8"  (1.727 m), weight 86.6 kg, SpO2 96 %. Physical Exam Cardiovascular:  Rate and Rhythm: Normal rate.  Pulmonary:     Effort: Pulmonary effort is normal.  Musculoskeletal:     Cervical back: Normal range of motion.  Neurological:     Mental Status: He is alert.      Assessment/Plan Adm for OP surgery: DISE   Jerrell Belfast, MD 05/23/2022, 12:18 PM

## 2022-05-23 NOTE — Transfer of Care (Signed)
Immediate Anesthesia Transfer of Care Note  Patient: Brandon [name]  Procedure(s) Performed: DRUG INDUCED SLEEP ENDOSCOPY (Mouth)  Patient Location: PACU  Anesthesia Type:MAC  Level of Consciousness: awake  Airway & Oxygen Therapy: Patient Spontanous Breathing and Patient connected to nasal cannula oxygen  Post-op Assessment: Report given to RN and Post -op Vital signs reviewed and stable  Post vital signs: Reviewed and stable  Last Vitals:  Vitals Value Taken Time  BP 88/56 05/23/22 1300  Temp 36.3 C 05/23/22 1257  Pulse 70 05/23/22 1305  Resp 16 05/23/22 1305  SpO2 94 % 05/23/22 1305  Vitals shown include unvalidated device data.  Last Pain:  Vitals:   05/23/22 1257  TempSrc:   PainSc: 0-No pain         Complications: No notable events documented.

## 2022-05-23 NOTE — Progress Notes (Signed)
Contacted anesthesiologist, Dr. Therisa Doyne about pt taking semaglutide (ozempic) yesterday, 05/22/22. No new orders at this time.   Jacqlyn Larsen, RN

## 2022-05-24 ENCOUNTER — Encounter (HOSPITAL_COMMUNITY): Payer: Self-pay | Admitting: Otolaryngology

## 2022-05-27 ENCOUNTER — Other Ambulatory Visit (HOSPITAL_BASED_OUTPATIENT_CLINIC_OR_DEPARTMENT_OTHER): Payer: Self-pay

## 2022-05-28 ENCOUNTER — Ambulatory Visit
Payer: No Typology Code available for payment source | Attending: Student in an Organized Health Care Education/Training Program | Admitting: Physical Therapy

## 2022-05-28 ENCOUNTER — Encounter: Payer: Self-pay | Admitting: Physical Therapy

## 2022-05-28 DIAGNOSIS — M5412 Radiculopathy, cervical region: Secondary | ICD-10-CM | POA: Insufficient documentation

## 2022-05-28 DIAGNOSIS — M6281 Muscle weakness (generalized): Secondary | ICD-10-CM | POA: Diagnosis present

## 2022-05-28 DIAGNOSIS — R293 Abnormal posture: Secondary | ICD-10-CM | POA: Insufficient documentation

## 2022-05-28 NOTE — Therapy (Signed)
OUTPATIENT PHYSICAL THERAPY   Patient Name: Brandon [name] MRN: [id number] DOB:[birth year], 66 y.o., male Today's Date: 05/28/2022   PT End of Session - 05/28/22 0957     Visit Number 3    Number of Visits 12    Date for PT Re-Evaluation 06/28/22    PT Start Time 0915    PT Stop Time 0957    PT Time Calculation (min) 42 min    Activity Tolerance Patient tolerated treatment well    Behavior During Therapy [facility] for tasks assessed/performed               Past Medical History:  Diagnosis Date   Anxiety    Arthritis    Basal cell carcinoma (BCC) in situ of skin    left neck   Chronic back pain    has spinal cord stimulator and wears buprenorphine patch   Colon polyps    Coronary artery disease    S/p DES to mRCA in 12/21 // S/p DES to LCx x 2 in 12/2020 // Diff dz in small to mod Dx (not a good target for PCI); OM1 100 CTO; severe dz in small OM2 and PDA >> Med Rx   Depression    Diabetes mellitus without complication (HCC)    type 2   Fibromyalgia    Gallstones    GERD (gastroesophageal reflux disease)    Hepatitis B    HLD (hyperlipidemia)    Hypertension    Osteoarthritis    Pneumonia    SBO (small bowel obstruction) (HCC)    Sleep apnea    does not use CPAP   Past Surgical History:  Procedure Laterality Date   CARDIAC CATHETERIZATION  01/17/2021   CATARACT EXTRACTION W/ INTRAOCULAR LENS  IMPLANT, BILATERAL     CHOLECYSTECTOMY, LAPAROSCOPIC     CORONARY STENT INTERVENTION N/A 08/15/2020   Procedure: CORONARY STENT INTERVENTION;  Surgeon: Corky Crafts, MD;  [facility]: [facility] INVAS[facility] CV LAB;  Service: Cardiovascular;  Laterality: N/A;   CORONARY STENT INTERVENTION N/A 01/17/2021   Procedure: CORONARY STENT INTERVENTION;  Surgeon: Kathleene Hazel, MD;  Location: [facility] INVASIVE CV LAB;  Service: Cardiovascular;  Laterality: N/A;   DRUG INDUCED ENDOSCOPY N/A 05/23/2022   Procedure: DRUG INDUCED SLEEP ENDOSCOPY;  Surgeon: Osborn Coho, MD;  Location: [facility]  OR;  Service: ENT;  Laterality: N/A;   HERNIA REPAIR     INTRAVASCULAR ULTRASOUND/IVUS N/A 08/15/2020   Procedure: Intravascular Ultrasound/IVUS;  Surgeon: Corky Crafts, MD;  Location: [facility] INVASIVE CV LAB;  Service: Cardiovascular;  Laterality: N/A;   LEFT HEART CATH AND CORONARY ANGIOGRAPHY N/A 08/15/2020   Procedure: LEFT HEART CATH AND CORONARY ANGIOGRAPHY;  Surgeon: Corky Crafts, MD;  Location: [facility] INVASIVE CV LAB;  Service: Cardiovascular;  Laterality: N/A;   LEFT HEART CATH AND CORONARY ANGIOGRAPHY N/A 01/17/2021   Procedure: LEFT HEART CATH AND CORONARY ANGIOGRAPHY;  Surgeon: Kathleene Hazel, MD;  Location: [facility] INVASIVE CV LAB;  Service: Cardiovascular;  Laterality: N/A;   LUMBAR FUSION     L3-5 with rods   MICRODISCECTOMY LUMBAR  2006   SPINAL CORD STIMULATOR INSERTION     blood clot removed from spinal cord   SPINE SURGERY     blood clot removed from spinal cord   XI ROBOTIC ASSISTED INGUINAL HERNIA REPAIR WITH MESH     x 3 surgeries   Patient Active Problem List   Diagnosis Date Noted   Radicular pain in right arm 05/02/2022   Neuroforaminal stenosis of  cervical spine 05/02/2022   Well adult exam 03/26/2022   SBO (small bowel obstruction) (HCC) 01/09/2022   Small bowel obstruction (HCC) 12/30/2021   Viral gastroenteritis 12/27/2021   AMS (altered mental status) 11/01/2021   Diabetes mellitus (HCC) 11/01/2021   Community acquired pneumonia 10/31/2021   Coughing 10/29/2021   Pain management contract signed 10/11/2021   History of lumbar fusion (L4/5) 10/02/2021   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   Chronic pain syndrome 06/01/2021   Obstructive sleep apnea 05/28/2021   Thrush 05/21/2021   Metallic taste 05/21/2021   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   Acute pain of left shoulder 04/03/2021   Unstable angina (HCC)    Vertigo 11/28/2020   Insomnia 09/03/2020   Status post coronary artery stent placement    Mixed hyperlipidemia     Coronary artery disease    Chest pain 08/11/2020   Lumbar facet arthropathy 06/22/2020   Major depression in partial remission (HCC) 06/04/2020   Hyperlipidemia associated with type 2 diabetes mellitus (HCC) 04/14/2020   Primary hypertension 04/07/2020   Type 2 diabetes mellitus with other specified complication (HCC) 04/07/2020   Sepsis (HCC) 10/06/2019   Spinal cord stimulator status 05/19/2017   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   Erectile dysfunction 10/09/2015    REFERRING PROVIDER: Edward Jolly  REFERRING DIAG: Cervical radiculopathy, radicular pain in Rt arm  THERAPY DIAG:  Radiculopathy, cervical region  Muscle weakness (generalized)  Abnormal posture  Rationale for Evaluation and Treatment Rehabilitation  ONSET DATE: 02/2022  SUBJECTIVE:                                                                                                                                                                                                         SUBJECTIVE STATEMENT: Pt states he feels "stiff" but he is not having pain. He has worked the past few days without having any issues  PERTINENT HISTORY:  History of bulging discs in cervical spine per patient, L3-5 fusion with cage  PAIN:  Are you having pain? No    PATIENT GOALS reduce pain and tingling  OBJECTIVE:   PATIENT SURVEYS:  FOTO 49   CERVICAL ROM:   Active ROM A/PROM (deg) eval  Flexion 50  Extension 35  Right lateral flexion 15 - symptoms  Left lateral flexion 25  Right rotation 40  Left rotation 38   (Blank rows = not tested)    UPPER EXTREMITY MMT:  MMT Right eval Left eval  Shoulder flexion 4/5 4/5  Shoulder extension    Shoulder abduction 4/5 4/5  Shoulder adduction  Shoulder extension    Shoulder internal rotation    Shoulder external rotation    Middle trapezius    Lower trapezius    Elbow flexion    Elbow extension    Wrist flexion    Wrist extension    Wrist ulnar deviation     Wrist radial deviation    Wrist pronation    Wrist supination    Grip strength 70 lbs 75 lbs   (Blank rows = not tested)  CERVICAL SPECIAL TESTS:  Spurling's test: Positive on Rt ULTT negative on Rt    TODAY'S TREATMENT:  05/28/22 UBE L2 x 4 min alt fwd/bkwd  Doorway stretch 2 x 30 sec SNAG cervical rotation 3 x 20 sec bilat Cervical extension with towel 2 x 30 sec UT stretch 2 x 30 sec bilat  With back against noodle: Bilat ER red TB x 10 Horizontal abd red TB x 10 Diagonals x 10 bilat  Prone cervical retraction x 10  Manual:  Prone PAs thoracic spine grade 2-3 Cervical spine mobs to improve lateral flexion STM UT and cervical paraspinals bilat   05/21/22 UBE L2 x 4 min alt fwd/bkwd  Doorway stretch 2 x 30 sec Cervical extension with towel 2 x 30 sec SNAG cervical rotation 3 x 20 sec bilat Upper trap stretch 2 x 30 sec bilat  With back against noodle: Bear hug x 10 Bilat ER x 10 Cervical retraction 3 sec hold x 10  Manual: STM cervical paraspinals and upper traps bilat PROM cervical spine all directions  Mechanical cervical traction 0-18lbs x 10 min   05/17/22 SNAG cervical rotation 2 x 10 seconds Cervical extension with towel 2 x 10 seconds Upper trap stretch 2 x 10 sec Cervical retraction 2 x 5 sec Doorway stretch 90 degrees 2 x 30 sec  PATIENT EDUCATION:  Education details: HEP, PT POC and goals Person educated: Patient Education method: Explanation, Demonstration, and Handouts Education comprehension: verbalized understanding and returned demonstration   HOME EXERCISE PROGRAM: Access Code: [email or web address] URL: [email or web address] Date: 05/17/2022 Prepared by: Reggy Eye  Exercises - Seated Assisted Cervical Rotation with Towel  - 1 x daily - 7 x weekly - 1 sets - 10 reps - 10 sec hold - Cervical Extension AROM with Strap  - 1 x daily - 7 x weekly - 1 sets - 10 reps - 10 seconds hold - Seated Upper Trapezius Stretch  - 1 x  daily - 7 x weekly - 1 sets - 10 reps - 10 seconds hold - Seated Cervical Retraction  - 1 x daily - 7 x weekly - 1 sets - 10 reps - 3-5 seconds hold - Doorway Pec Stretch at 90 Degrees Abduction  - 1 x daily - 7 x weekly - 1 sets - 3 reps - 20-30 seconds hold  ASSESSMENT:  CLINICAL IMPRESSION: Continued progression of postural strength with addition of prone cervical retraction. Good response to manual work to improve ROM   EVALUATION COMPLEXITY: Moderate   GOALS: Goals reviewed with patient? Yes    LONG TERM GOALS: Target date: 06/28/2022  Pt will be independent with HEP  Goal status: INITIAL  2.  Pt will improve FOTO to >= 64 to demo improved functional mobility Baseline: 49 Goal status: INITIAL  3.  Pt will improve cervical rotation and sidebending ROM by 20 degrees bilat Baseline: see eval Goal status: INITIAL  4.  Pt will tolerate working at computer x 1 hour with no increase in symptoms  Goal  status: INITIAL  5.  Pt will improve Rt grip strength to >= 80lbs Baseline: 70 Goal status: INITIAL     PLAN: PT FREQUENCY: 2x/week  PT DURATION: 6 weeks  PLANNED INTERVENTIONS: Therapeutic exercises, Therapeutic activity, Neuromuscular re-education, Balance training, Gait training, Patient/Family education, Self Care, Joint mobilization, Aquatic Therapy, Dry Needling, Electrical stimulation, Cryotherapy, Moist heat, Taping, Traction, Ionotophoresis 4mg /ml Dexamethasone, Manual therapy, and Re-evaluation  PLAN FOR NEXT SESSION: postural strength, cervical ROM, manual traction, needling?   Seerat Peaden, PT 05/28/2022, 9:58 AM

## 2022-05-29 ENCOUNTER — Other Ambulatory Visit (HOSPITAL_BASED_OUTPATIENT_CLINIC_OR_DEPARTMENT_OTHER): Payer: Self-pay

## 2022-05-29 IMAGING — DX DG HIP (WITH OR WITHOUT PELVIS) 2-3V*R*
3 series · 3 of 3 positions shown · non-contrast
Comparison: None.

CLINICAL DATA: Right-sided hip pain

EXAM:
DG HIP (WITH OR WITHOUT PELVIS) 2-3V RIGHT

[pelvis ap]
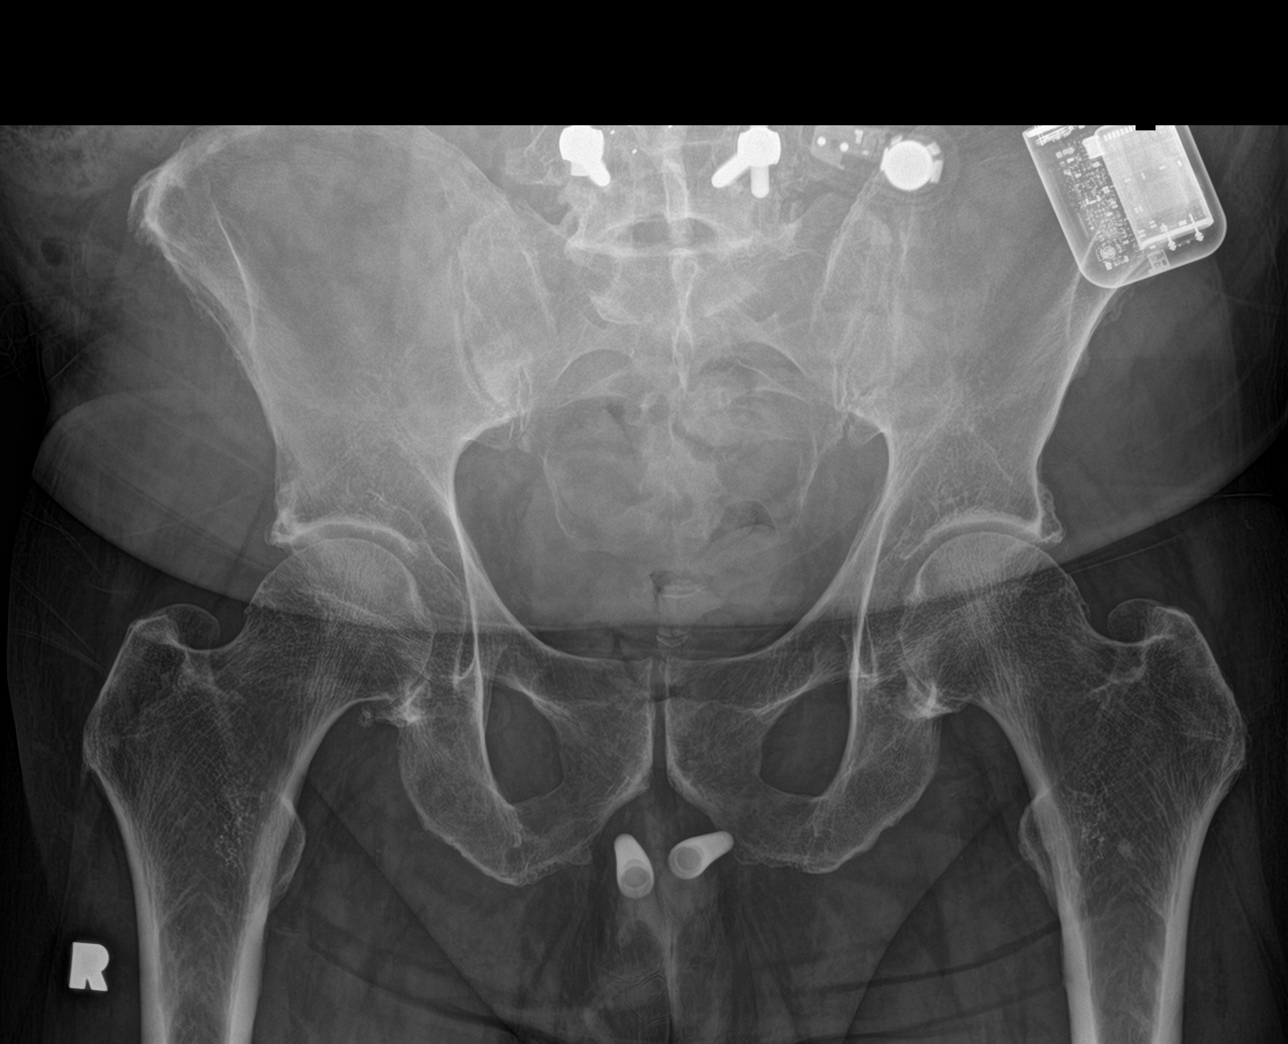

[hip ap]
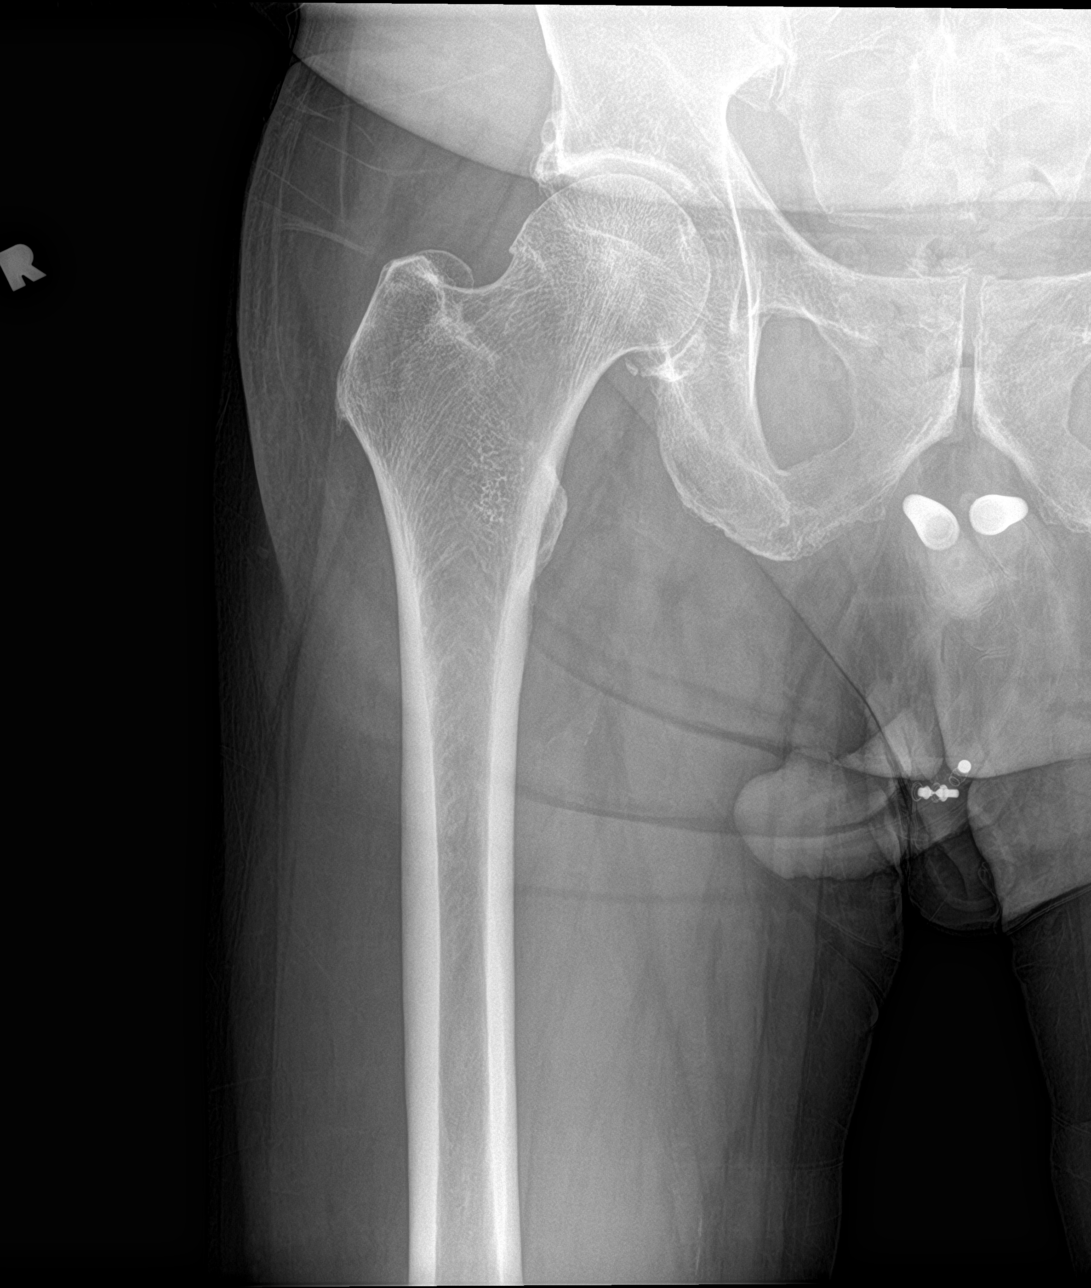

[hip lat]
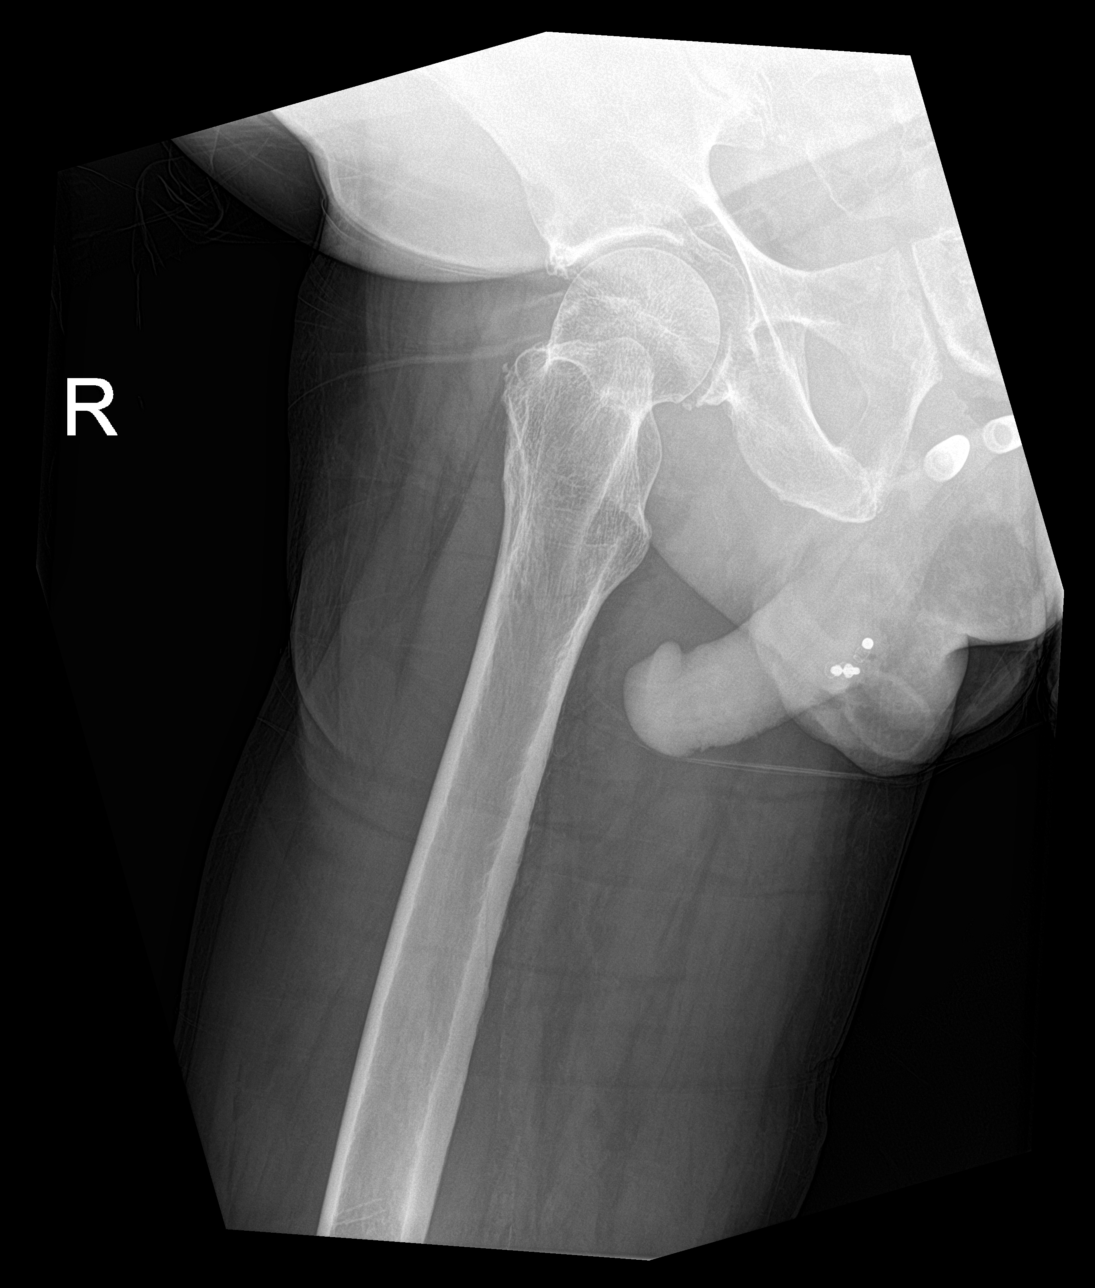

[3 of 3 positions shown; findings below may reference images not displayed]

FINDINGS: Hardware in the lumbar spine. Penile prosthesis. Incompletely
visualized left lower quadrant generator. Pubic symphysis and rami
are intact. No fracture or malalignment. Mild bilateral hip
arthritis.
IMPRESSION: Mild bilateral hip arthritis.

## 2022-05-30 ENCOUNTER — Other Ambulatory Visit (HOSPITAL_BASED_OUTPATIENT_CLINIC_OR_DEPARTMENT_OTHER): Payer: Self-pay

## 2022-05-31 ENCOUNTER — Ambulatory Visit: Payer: No Typology Code available for payment source | Admitting: Physical Therapy

## 2022-05-31 ENCOUNTER — Encounter: Payer: Self-pay | Admitting: Physical Therapy

## 2022-05-31 DIAGNOSIS — M5412 Radiculopathy, cervical region: Secondary | ICD-10-CM

## 2022-05-31 DIAGNOSIS — M6281 Muscle weakness (generalized): Secondary | ICD-10-CM

## 2022-05-31 DIAGNOSIS — R293 Abnormal posture: Secondary | ICD-10-CM

## 2022-05-31 NOTE — Therapy (Addendum)
OUTPATIENT PHYSICAL THERAPY AND DISCHARGE   Patient Name: Brandon [name] MRN: [id number] DOB:04/15/1956, 66 y.o., male Today's Date: 05/31/2022   PT End of Session - 05/31/22 1053     Visit Number 4    Number of Visits 12    Date for PT Re-Evaluation 06/28/22    PT Start Time 1013    PT Stop Time 1054    PT Time Calculation (min) 41 min    Activity Tolerance Patient tolerated treatment well    Behavior During Therapy [facility] for tasks assessed/performed                Past Medical History:  Diagnosis Date   Anxiety    Arthritis    Basal cell carcinoma (BCC) in situ of skin    left neck   Chronic back pain    has spinal cord stimulator and wears buprenorphine patch   Colon polyps    Coronary artery disease    S/p DES to mRCA in 12/21 // S/p DES to LCx x 2 in 12/2020 // Diff dz in small to mod Dx (not a good target for PCI); OM1 100 CTO; severe dz in small OM2 and PDA >> Med Rx   Depression    Diabetes mellitus without complication (HCC)    type 2   Fibromyalgia    Gallstones    GERD (gastroesophageal reflux disease)    Hepatitis B    HLD (hyperlipidemia)    Hypertension    Osteoarthritis    Pneumonia    SBO (small bowel obstruction) (HCC)    Sleep apnea    does not use CPAP   Past Surgical History:  Procedure Laterality Date   CARDIAC CATHETERIZATION  01/17/2021   CATARACT EXTRACTION W/ INTRAOCULAR LENS  IMPLANT, BILATERAL     CHOLECYSTECTOMY, LAPAROSCOPIC     CORONARY STENT INTERVENTION N/A 08/15/2020   Procedure: CORONARY STENT INTERVENTION;  Surgeon: Corky Crafts, MD;  Location: [facility] INVAS[facility] CV LAB;  Service: Cardiovascular;  Laterality: N/A;   CORONARY STENT INTERVENTION N/A 01/17/2021   Procedure: CORONARY STENT INTERVENTION;  Surgeon: Kathleene Hazel, MD;  Location: [facility]VASIVE CV LAB;  Service: Cardiovascular;  Laterality: N/A;   DRUG INDUCED ENDOSCOPY N/A 05/23/2022   Procedure: DRUG INDUCED SLEEP ENDOSCOPY;  Surgeon: Osborn Coho,  MD;  Location: [facility] OR;  Service: ENT;  Laterality: N/A;   HERNIA REPAIR     INTRAVASCULAR ULTRASOUND/IVUS N/A 08/15/2020   Procedure: Intravascular Ultrasound/IVUS;  Surgeon: Corky Crafts, MD;  Location: [facility] INVASIVE CV LAB;  Service: Cardiovascular;  Laterality: N/A;   LEFT HEART CATH AND CORONARY ANGIOGRAPHY N/A 08/15/2020   Procedure: LEFT HEART CATH AND CORONARY ANGIOGRAPHY;  Surgeon: Corky Crafts, MD;  [facility] INVASIVE CV LAB;  Service: Cardiovascular;  Laterality: N/A;   LEFT HEART CATH AND CORONARY ANGIOGRAPHY N/A 01/17/2021   Procedure: LEFT HEART CATH AND CORONARY ANGIOGRAPHY;  Surgeon: Kathleene Hazel, MD;  Location: [facility] INVASIVE CV LAB;  Service: Cardiovascular;  Laterality: N/A;   LUMBAR FUSION     L3-5 with rods   MICRODISCECTOMY LUMBAR  2006   SPINAL CORD STIMULATOR INSERTION     blood clot removed from spinal cord   SPINE SURGERY     blood clot removed from spinal cord   XI ROBOTIC ASSISTED INGUINAL HERNIA REPAIR WITH MESH     x 3 surgeries   Patient Active Problem List   Diagnosis Date Noted   Radicular pain in right arm 05/02/2022  Neuroforaminal stenosis of cervical spine 05/02/2022   Well adult exam 03/26/2022   SBO (small bowel obstruction) (HCC) 01/09/2022   Small bowel obstruction (HCC) 12/30/2021   Viral gastroenteritis 12/27/2021   AMS (altered mental status) 11/01/2021   Diabetes mellitus (HCC) 11/01/2021   Community acquired pneumonia 10/31/2021   Coughing 10/29/2021   Pain management contract signed 10/11/2021   History of lumbar fusion (L4/5) 10/02/2021   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   Chronic pain syndrome 06/01/2021   Obstructive sleep apnea 05/28/2021   Thrush 05/21/2021   Metallic taste 05/21/2021   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   Acute pain of left shoulder 04/03/2021   Unstable angina (HCC)    Vertigo 11/28/2020   Insomnia 09/03/2020   Status post coronary artery stent placement    Mixed  hyperlipidemia    Coronary artery disease    Chest pain 08/11/2020   Lumbar facet arthropathy 06/22/2020   Major depression in partial remission (HCC) 06/04/2020   Hyperlipidemia associated with type 2 diabetes mellitus (HCC) 04/14/2020   Primary hypertension 04/07/2020   Type 2 diabetes mellitus with other specified complication (HCC) 04/07/2020   Sepsis (HCC) 10/06/2019   Spinal cord stimulator status 05/19/2017   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   Erectile dysfunction 10/09/2015    REFERRING PROVIDER: Edward Jolly  REFERRING DIAG: Cervical radiculopathy, radicular pain in Rt arm  THERAPY DIAG:  Radiculopathy, cervical region  Muscle weakness (generalized)  Abnormal posture  Rationale for Evaluation and Treatment Rehabilitation  ONSET DATE: 02/2022  SUBJECTIVE:                                                                                                                                                                                                         SUBJECTIVE STATEMENT: Pt states he was sore after last visit but now feels more relaxed  PERTINENT HISTORY:  History of bulging discs in cervical spine per patient, L3-5 fusion with cage  PAIN:  Are you having pain? No    PATIENT GOALS reduce pain and tingling  OBJECTIVE:   PATIENT SURVEYS:  FOTO 49   CERVICAL ROM:   Active ROM A/PROM (deg) eval   Flexion 50   Extension 35   Right lateral flexion 15 - symptoms 20  Left lateral flexion 25 25  Right rotation 40 45  Left rotation 38 42   (Blank rows = not tested)    UPPER EXTREMITY MMT:  MMT Right eval Left eval  Shoulder flexion 4/5 4/5  Shoulder extension    Shoulder abduction 4/5 4/5  Shoulder adduction  Shoulder extension    Shoulder internal rotation    Shoulder external rotation    Middle trapezius    Lower trapezius    Elbow flexion    Elbow extension    Wrist flexion    Wrist extension    Wrist ulnar deviation    Wrist radial  deviation    Wrist pronation    Wrist supination    Grip strength 70 lbs 75 lbs   (Blank rows = not tested)  CERVICAL SPECIAL TESTS:  Spurling's test: Positive on Rt ULTT negative on Rt    TODAY'S TREATMENT:  05/31/22 UBE L3 x 4 min alt fwd/bkwd  SNAG cervical rotation 3 x 20 sec bilat Cervical extension with towel 2 x 30 sec  With back against noodle: Horizontal abd red TB x 20 Bilat ER red TB x 20 Diagonals x 10 bilat red TB  Prone cervical retraction 10 x 3 sec hold  Manual: PAs thoracic and cervical grade 2-3 Cervical spine mobs to improve lateral flexion STM UT and cervical paraspinals bilat   05/28/22 UBE L2 x 4 min alt fwd/bkwd  Doorway stretch 2 x 30 sec SNAG cervical rotation 3 x 20 sec bilat Cervical extension with towel 2 x 30 sec UT stretch 2 x 30 sec bilat  With back against noodle: Bilat ER red TB x 10 Horizontal abd red TB x 10 Diagonals x 10 bilat  Prone cervical retraction x 10  Manual:  Prone PAs thoracic spine grade 2-3 Cervical spine mobs to improve lateral flexion STM UT and cervical paraspinals bilat   05/21/22 UBE L2 x 4 min alt fwd/bkwd  Doorway stretch 2 x 30 sec Cervical extension with towel 2 x 30 sec SNAG cervical rotation 3 x 20 sec bilat Upper trap stretch 2 x 30 sec bilat  With back against noodle: Bear hug x 10 Bilat ER x 10 Cervical retraction 3 sec hold x 10  Manual: STM cervical paraspinals and upper traps bilat PROM cervical spine all directions  Mechanical cervical traction 0-18lbs x 10 min    PATIENT EDUCATION:  Education details: HEP, PT POC and goals Person educated: Patient Education method: Explanation, Demonstration, and Handouts Education comprehension: verbalized understanding and returned demonstration   HOME EXERCISE PROGRAM: Access Code: [email or web address] URL: [email or web address] Date: 05/17/2022 Prepared by: Reggy Eye  Exercises - Seated Assisted Cervical Rotation with  Towel  - 1 x daily - 7 x weekly - 1 sets - 10 reps - 10 sec hold - Cervical Extension AROM with Strap  - 1 x daily - 7 x weekly - 1 sets - 10 reps - 10 seconds hold - Seated Upper Trapezius Stretch  - 1 x daily - 7 x weekly - 1 sets - 10 reps - 10 seconds hold - Seated Cervical Retraction  - 1 x daily - 7 x weekly - 1 sets - 10 reps - 3-5 seconds hold - Doorway Pec Stretch at 90 Degrees Abduction  - 1 x daily - 7 x weekly - 1 sets - 3 reps - 20-30 seconds hold  ASSESSMENT:  CLINICAL IMPRESSION: Pt with small increase in cervical ROM. Good response to jt mobs in thoracic and cervical spine today     GOALS: Goals reviewed with patient? Yes    LONG TERM GOALS: Target date: 06/28/2022  Pt will be independent with HEP  Goal status: INITIAL  2.  Pt will improve FOTO to >= 64 to demo improved functional mobility Baseline: 49 Goal status: INITIAL  3.  Pt will improve cervical rotation and sidebending ROM by 20 degrees bilat Baseline: see eval Goal status: INITIAL  4.  Pt will tolerate working at computer x 1 hour with no increase in symptoms  Goal status: INITIAL  5.  Pt will improve Rt grip strength to >= 80lbs Baseline: 70 Goal status: INITIAL     PLAN: PT FREQUENCY: 2x/week  PT DURATION: 6 weeks  PLANNED INTERVENTIONS: Therapeutic exercises, Therapeutic activity, Neuromuscular re-education, Balance training, Gait training, Patient/Family education, Self Care, Joint mobilization, Aquatic Therapy, Dry Needling, Electrical stimulation, Cryotherapy, Moist heat, Taping, Traction, Ionotophoresis 4mg /ml Dexamethasone, Manual therapy, and Re-evaluation  PLAN FOR NEXT SESSION: postural strength, cervical ROM, manual traction, needling? PHYSICAL THERAPY DISCHARGE SUMMARY  Visits from Start of Care: 4  Current functional level related to goals / functional outcomes: Improved ROM, decreased pain   Remaining deficits: See above   Education / Equipment: HEP   Patient  agrees to discharge. Patient goals were not met. Patient is being discharged due to not returning since the last visit. Reggy Eye, PT,DPT10/31/232:10 PM   Alaa Eyerman, PT 05/31/2022, 10:54 AM

## 2022-06-04 ENCOUNTER — Other Ambulatory Visit: Payer: Self-pay | Admitting: Family Medicine

## 2022-06-04 ENCOUNTER — Encounter: Payer: No Typology Code available for payment source | Admitting: Physical Therapy

## 2022-06-04 ENCOUNTER — Other Ambulatory Visit (HOSPITAL_BASED_OUTPATIENT_CLINIC_OR_DEPARTMENT_OTHER): Payer: Self-pay

## 2022-06-04 MED ORDER — PANTOPRAZOLE SODIUM 40 MG PO TBEC
40.0000 mg | DELAYED_RELEASE_TABLET | Freq: Every day | ORAL | 0 refills | Status: DC
  Filled 2022-06-04: qty 90, 90d supply, fill #0

## 2022-06-04 MED ORDER — BUPROPION HCL ER (XL) 300 MG PO TB24
300.0000 mg | ORAL_TABLET | Freq: Every day | ORAL | 0 refills | Status: DC
  Filled 2022-06-04: qty 90, 90d supply, fill #0

## 2022-06-05 ENCOUNTER — Other Ambulatory Visit (HOSPITAL_BASED_OUTPATIENT_CLINIC_OR_DEPARTMENT_OTHER): Payer: Self-pay

## 2022-06-10 ENCOUNTER — Telehealth: Payer: Self-pay | Admitting: Cardiology

## 2022-06-10 NOTE — Telephone Encounter (Signed)
   Pre-operative Risk Assessment    Patient Name: Brandon [name]  DOB: [birth year] MRN: [id number]      Request for Surgical Clearance    Procedure:   Inspire implant  Date of Surgery:  Clearance 07/02/22                                 Surgeon:  Dr. Cecil Cranker Surgeon's Group or Practice Name:  [facility], [place] [facility] Phone number:  [phone] Fax number:  [phone]   Type of Clearance Requested:   - Medical  - Pharmacy:  Hold Clopidogrel (Plavix)     Type of Anesthesia:  General    Additional requests/questions:   Caller stated patient will need medical clearance and wants to know how many days to hold Plavix and when should the patient re-start this medication.    Signed, Heloise Beecham   06/10/2022, 10:06 AM

## 2022-06-10 NOTE — Telephone Encounter (Signed)
   Name: Brandon [name]  DOB: 04-25-1956  MRN: [id number]   Primary Cardiologist: Freada Bergeron, MD  Chart reviewed as part of pre-operative protocol coverage. Patient was basically cleared for this procedure 05/21/2022.  Per previous notes, patient not required to hold Plavix prior to procedure.  I will route this recommendation as the original clearance to the requesting party via Epic fax function and remove from pre-op pool. Please call with questions.  Lenna Sciara, NP 06/10/2022, 1:52 PM

## 2022-06-11 ENCOUNTER — Encounter: Payer: No Typology Code available for payment source | Admitting: Physical Therapy

## 2022-06-20 ENCOUNTER — Other Ambulatory Visit: Payer: Self-pay | Admitting: Otolaryngology

## 2022-06-24 ENCOUNTER — Encounter (HOSPITAL_BASED_OUTPATIENT_CLINIC_OR_DEPARTMENT_OTHER): Payer: Self-pay | Admitting: Otolaryngology

## 2022-06-24 ENCOUNTER — Other Ambulatory Visit: Payer: Self-pay

## 2022-06-24 ENCOUNTER — Other Ambulatory Visit (HOSPITAL_BASED_OUTPATIENT_CLINIC_OR_DEPARTMENT_OTHER): Payer: Self-pay

## 2022-06-24 ENCOUNTER — Telehealth: Payer: Self-pay

## 2022-06-24 NOTE — Telephone Encounter (Signed)
For patients on the pump, I usually recommend to continue the same basal rates, but not to bolus for meals unless they are eating regular meals and only do corrections if the sugars increase above 180.  They can resume taking boluses for meals when they start eating after the surgery.

## 2022-06-24 NOTE — Telephone Encounter (Signed)
Pt is calling back to to see if the aspirin will be okay to continue to take as well previous to this procedure. Requesting call back.

## 2022-06-24 NOTE — Telephone Encounter (Signed)
I have sent a message to pre op provider to confirm if ok for pt to take ASA for his procedure.  See notes .

## 2022-06-24 NOTE — Telephone Encounter (Signed)
I s/w the pt and advised per Nicholes Rough, [facility] the recommendation in regard to ASA hold or not, that he should d/w Dr. Avon Gully. Per pre op provider ASA hold will be upt to Dr. Wilburn Cornelia. Pt said thank you.

## 2022-06-24 NOTE — Telephone Encounter (Signed)
Patient LVM stating that he will be having a procedure on 11/7 for an "Inspire Surgery" and would like to know what he needs to do with his insulin pump during the procedure as he is in preparation.

## 2022-06-25 ENCOUNTER — Encounter (HOSPITAL_BASED_OUTPATIENT_CLINIC_OR_DEPARTMENT_OTHER)
Admission: RE | Admit: 2022-06-25 | Discharge: 2022-06-25 | Disposition: A | Discharge status: Home / Self Care | Payer: No Typology Code available for payment source | Source: Ambulatory Visit | Attending: Otolaryngology | Admitting: Otolaryngology

## 2022-06-25 ENCOUNTER — Other Ambulatory Visit (HOSPITAL_BASED_OUTPATIENT_CLINIC_OR_DEPARTMENT_OTHER): Payer: Self-pay

## 2022-06-25 DIAGNOSIS — Z01812 Encounter for preprocedural laboratory examination: Secondary | ICD-10-CM | POA: Insufficient documentation

## 2022-06-25 LAB — BASIC METABOLIC PANEL
Anion gap: 6 (ref 5–15)
BUN: 9 mg/dL (ref 8–23)
CO2: 27 mmol/L (ref 22–32)
Calcium: 9.1 mg/dL (ref 8.9–10.3)
Chloride: 107 mmol/L (ref 98–111)
Creatinine, Ser: 0.88 mg/dL (ref 0.61–1.24)
GFR, Estimated: >60 mL/min (ref >60)
Glucose, Bld: 172 mg/dL — ABNORMAL HIGH (ref 70–99)
Potassium: 4.6 mmol/L (ref 3.5–5.1)
Sodium: 140 mmol/L (ref 135–145)

## 2022-06-25 NOTE — Telephone Encounter (Signed)
Patient informed and expressed understanding

## 2022-06-28 ENCOUNTER — Other Ambulatory Visit (HOSPITAL_BASED_OUTPATIENT_CLINIC_OR_DEPARTMENT_OTHER): Payer: Self-pay

## 2022-07-01 ENCOUNTER — Ambulatory Visit (HOSPITAL_COMMUNITY): Payer: No Typology Code available for payment source | Admitting: Anesthesiology

## 2022-07-02 ENCOUNTER — Other Ambulatory Visit: Payer: Self-pay

## 2022-07-02 ENCOUNTER — Encounter (HOSPITAL_BASED_OUTPATIENT_CLINIC_OR_DEPARTMENT_OTHER): Payer: Self-pay | Admitting: Otolaryngology

## 2022-07-02 ENCOUNTER — Ambulatory Visit (HOSPITAL_BASED_OUTPATIENT_CLINIC_OR_DEPARTMENT_OTHER)
Admission: RE | Admit: 2022-07-02 | Discharge: 2022-07-02 | Disposition: A | Discharge status: Home / Self Care | Payer: No Typology Code available for payment source | Attending: Otolaryngology | Admitting: Otolaryngology

## 2022-07-02 DIAGNOSIS — Z7902 Long term (current) use of antithrombotics/antiplatelets: Secondary | ICD-10-CM | POA: Insufficient documentation

## 2022-07-02 DIAGNOSIS — E1165 Type 2 diabetes mellitus with hyperglycemia: Secondary | ICD-10-CM

## 2022-07-02 DIAGNOSIS — G4733 Obstructive sleep apnea (adult) (pediatric): Secondary | ICD-10-CM | POA: Diagnosis present

## 2022-07-02 HISTORY — DX: Nausea with vomiting, unspecified: Z98.890

## 2022-07-02 HISTORY — DX: Nausea with vomiting, unspecified: R11.2

## 2022-07-02 LAB — GLUCOSE, CAPILLARY: Glucose-Capillary: 117 mg/dL — ABNORMAL HIGH (ref 70–99)

## 2022-07-02 MED ORDER — PHENYLEPHRINE HCL (PRESSORS) 10 MG/ML IV SOLN
INTRAVENOUS | Status: AC
  Filled 2022-07-02: qty 1

## 2022-07-02 MED ORDER — ACETAMINOPHEN 500 MG PO TABS
ORAL_TABLET | ORAL | Status: AC
  Filled 2022-07-02: qty 2

## 2022-07-02 MED ORDER — LIDOCAINE-EPINEPHRINE 1 %-1:100000 IJ SOLN
INTRAMUSCULAR | Status: AC
  Filled 2022-07-02: qty 1

## 2022-07-02 MED ORDER — ACETAMINOPHEN 500 MG PO TABS
1000.0000 mg | ORAL_TABLET | Freq: Once | ORAL | Status: AC
  Administered 2022-07-02: 1000 mg via ORAL

## 2022-07-02 MED ORDER — MIDAZOLAM HCL 2 MG/2ML IJ SOLN
INTRAMUSCULAR | Status: AC
  Filled 2022-07-02: qty 2

## 2022-07-02 MED ORDER — VANCOMYCIN HCL IN DEXTROSE 1-5 GM/200ML-% IV SOLN: 1000.0000 mg | INTRAVENOUS | Status: DC

## 2022-07-02 MED ORDER — VANCOMYCIN HCL IN DEXTROSE 1-5 GM/200ML-% IV SOLN
INTRAVENOUS | Status: AC
  Filled 2022-07-02: qty 200

## 2022-07-02 MED ORDER — LACTATED RINGERS IV SOLN: INTRAVENOUS | Status: DC

## 2022-07-02 MED ORDER — FENTANYL CITRATE (PF) 100 MCG/2ML IJ SOLN
INTRAMUSCULAR | Status: AC
  Filled 2022-07-02: qty 2

## 2022-07-02 NOTE — Anesthesia Preprocedure Evaluation (Addendum)
Anesthesia Evaluation  Patient identified by MRN, date of birth, ID band Patient awake    Reviewed: Allergy & Precautions, NPO status , Patient's Chart, lab work & pertinent test results  History of Anesthesia Complications Negative for: history of anesthetic complications  Airway Mallampati: II  TM Distance: >3 FB Neck ROM: Full    Dental  (+) Upper Dentures, Lower Dentures, Dental Advisory Given   Pulmonary sleep apnea , former smoker   Pulmonary exam normal        Cardiovascular hypertension, Pt. on home beta blockers + CAD and + Cardiac Stents (07/2020, 12/2020)  Normal cardiovascular exam  Echo 11/01/21: EF 60-65%, no RWMA, mod LVH, g1dd, normal RVSF, valves normal    Neuro/Psych   Anxiety Depression    S/p spinal cord stimulator    GI/Hepatic ,GERD  ,,  Endo/Other  diabetes, Type 2, Insulin Dependent    Renal/GU negative Renal ROS  negative genitourinary   Musculoskeletal  (+) Arthritis ,  Fibromyalgia -  Abdominal   Peds  Hematology negative hematology ROS (+)   Anesthesia Other Findings Buprenorphine Plavix Ozempic  Reproductive/Obstetrics                             Anesthesia Physical Anesthesia Plan  ASA: 3  Anesthesia Plan: General   Post-op Pain Management: Tylenol PO (pre-op)* and Toradol IV (intra-op)*   Induction: Intravenous  PONV Risk Score and Plan: 2 and Ondansetron, Dexamethasone, Treatment may vary due to age or medical condition and Midazolam  Airway Management Planned: Oral ETT  Additional Equipment: None  Intra-op Plan:   Post-operative Plan: Extubation in OR  Informed Consent: I have reviewed the patients History and Physical, chart, labs and discussed the procedure including the risks, benefits and alternatives for the proposed anesthesia with the patient or authorized representative who has indicated his/her understanding and acceptance.      Dental advisory given  Plan Discussed with:   Anesthesia Plan Comments: ( **Case cancelled by surgeon due to continuation of Plavix perioperatively**)       Anesthesia Quick Evaluation

## 2022-07-02 NOTE — H&P (Signed)
Brandon [name] is an 66 y.o. male.   Chief Complaint: Obstructive sleep apnea HPI: History of obstructive sleep apnea, unable to tolerate CPAP for long-term treatment.  Past Medical History:  Diagnosis Date   Anxiety    Arthritis    Basal cell carcinoma (BCC) in situ of skin    left neck   Chronic back pain    has spinal cord stimulator and wears buprenorphine patch   Colon polyps    Coronary artery disease    S/p DES to mRCA in 12/21 // S/p DES to LCx x 2 in 12/2020 // Diff dz in small to mod Dx (not a good target for PCI); OM1 100 CTO; severe dz in small OM2 and PDA >> Med Rx   Depression    Diabetes mellitus without complication (HCC)    type 2   Fibromyalgia    Gallstones    GERD (gastroesophageal reflux disease)    Hepatitis B    HLD (hyperlipidemia)    Hypertension    Osteoarthritis    Pneumonia    PONV (postoperative nausea and vomiting)    SBO (small bowel obstruction) ([place])    Sleep apnea    does not use CPAP    Past Surgical History:  Procedure Laterality Date   CARDIAC CATHETERIZATION  01/17/2021   CATARACT EXTRACTION W/ INTRAOCULAR LENS  IMPLANT, BILATERAL     CHOLECYSTECTOMY     CHOLECYSTECTOMY, LAPAROSCOPIC     CORONARY STENT INTERVENTION N/A 08/15/2020   Procedure: CORONARY STENT INTERVENTION;  Surgeon: Jettie Booze, MD;  Location: [facility];  Service: Cardiovascular;  Laterality: N/A;   CORONARY STENT INTERVENTION N/A 01/17/2021   Procedure: CORONARY STENT INTERVENTION;  Surgeon: Burnell Blanks, MD;  Location: [facility];  Service: Cardiovascular;  Laterality: N/A;   DRUG INDUCED ENDOSCOPY N/A 05/23/2022   Procedure: DRUG INDUCED SLEEP ENDOSCOPY;  Surgeon: Jerrell Belfast, MD;  Location: [facility];  Service: ENT;  Laterality: N/A;   HERNIA REPAIR     INTRAVASCULAR ULTRASOUND/IVUS N/A 08/15/2020   Procedure: Intravascular Ultrasound/IVUS;  Surgeon: Jettie Booze, MD;  Location: [facility];  Service:  Cardiovascular;  Laterality: N/A;   LEFT HEART CATH AND CORONARY ANGIOGRAPHY N/A 08/15/2020   Procedure: LEFT HEART CATH AND CORONARY ANGIOGRAPHY;  Surgeon: Jettie Booze, MD;  Location: [facility];  Service: Cardiovascular;  Laterality: N/A;   LEFT HEART CATH AND CORONARY ANGIOGRAPHY N/A 01/17/2021   Procedure: LEFT HEART CATH AND CORONARY ANGIOGRAPHY;  Surgeon: Burnell Blanks, MD;  Location: [facility];  Service: Cardiovascular;  Laterality: N/A;   LUMBAR FUSION     L3-5 with rods   MICRODISCECTOMY LUMBAR  2006   SPINAL CORD STIMULATOR INSERTION     blood clot removed from spinal cord   SPINE SURGERY     blood clot removed from spinal cord   XI ROBOTIC ASSISTED INGUINAL HERNIA REPAIR WITH MESH     x 3 surgeries    Family History  Problem Relation Age of Onset   COPD Mother    Anxiety disorder Mother    Depression Mother    Colon cancer Mother    Cancer Father        mets, origin unknown   Anxiety disorder Father    Depression Father    Coronary artery disease Father    CAD Brother    Leukemia Maternal Grandmother    Multiple sclerosis Daughter    Diabetes Neg Hx  Social History:  reports that he quit smoking about 16 years ago. His smoking use included cigarettes. He has a 30.00 pack-year smoking history. He has never used smokeless tobacco. He reports that he does not currently use alcohol. He reports that he does not currently use drugs.  Allergies:  Allergies  Allergen Reactions   Kiwi Extract Swelling    Mouth swelling.    Other Swelling and Other (See Comments)    EGGPLANT. Mouth swelling.   Penicillins Rash    Reaction: unknown   Ancef [Cefazolin] Rash   Latex Rash   Levaquin [Levofloxacin] Rash   Peach [Prunus Persica] Other (See Comments)    Burns mouth    Medications Prior to Admission  Medication Sig Dispense Refill   acetaminophen (TYLENOL) 500 MG tablet Take 1,000 mg by mouth See admin instructions. Take 2 tablets  (1000 mg) by mouth scheduled in the morning & may take 2 tablets (1000 mg) by mouth every 6 hours if needed for pain.     ARIPiprazole (ABILIFY) 10 MG tablet Take 1 tablet (10 mg total) by mouth every morning. 90 tablet 1   aspirin 81 MG EC tablet Take 81 mg by mouth every morning.     atorvastatin (LIPITOR) 80 MG tablet Take 1 tablet (80 mg total) by mouth daily. 90 tablet 2   b complex vitamins capsule Take 1 capsule by mouth in the morning.     buprenorphine (BUTRANS) 15 MCG/HR Place 1 patch onto the skin once a week. (Patient taking differently: Place 1 patch onto the skin every Friday.) 4 patch 2   buPROPion (WELLBUTRIN XL) 300 MG 24 hr tablet Take 1 tablet (300 mg total) by mouth daily. 90 tablet 0   clopidogrel (PLAVIX) 75 MG tablet TAKE 1 TABLET (75 MG TOTAL) BY MOUTH DAILY WITH BREAKFAST. 90 tablet 3   docusate sodium (COLACE) 100 MG capsule Take 200 mg by mouth in the morning.     empagliflozin (JARDIANCE) 25 MG TABS tablet Take 1 tablet (25 mg total) by mouth daily. 90 tablet 3   GVOKE HYPOPEN 1-PACK 1 MG/0.2ML SOAJ Use as needed for hypoglycemia 0.2 mL PRN   insulin lispro (HUMALOG) 100 UNIT/ML injection For use in pump, total of 60 units per day 60 mL 3   Iron, Ferrous Sulfate, 325 (65 Fe) MG TABS Take 1 tablet (325 mg) by mouth daily. 100 tablet 1   isosorbide mononitrate (IMDUR) 30 MG 24 hr tablet Take 1 tablet (30 mg total) by mouth daily. 90 tablet 2   magnesium oxide (MAG-OX) 400 (240 Mg) MG tablet Take 800 mg by mouth in the morning.     metoprolol succinate (TOPROL XL) 25 MG 24 hr tablet Take 1 tablet (25 mg total) by mouth daily. 90 tablet 2   ondansetron (ZOFRAN-ODT) 4 MG disintegrating tablet Take 1 tablet (4 mg total) by mouth every 8 (eight) hours as needed for nausea or vomiting. 20 tablet 0   pantoprazole (PROTONIX) 40 MG tablet Take 1 tablet (40 mg total) by mouth daily. 90 tablet 0   Semaglutide, 2 MG/DOSE, (OZEMPIC, 2 MG/DOSE,) 8 MG/3ML SOPN Inject 2 mg into the skin  once a week. (Patient taking differently: Inject 2 mg into the skin every Wednesday.) 9 mL 1   tamsulosin (FLOMAX) 0.4 MG CAPS capsule Take 1 capsule (0.4 mg total) by mouth daily. 90 capsule 1   testosterone (ANDROGEL) 50 MG/5GM (1%) GEL Place 5 g (1 packet) onto the shoulders and upper arms daily  as directed. 150 g 1   tiZANidine (ZANAFLEX) 4 MG tablet Take 1-1&1/2 tablets (4-6 mg total) by mouth every 8 (eight) hours as needed for muscle spasms. (Patient taking differently: Take 8 mg by mouth in the morning.) 90 tablet 3   albuterol (VENTOLIN HFA) 108 (90 Base) MCG/ACT inhaler Inhale 2 puffs by mouth  into the lungs every 6 (six) hours as needed for wheezing. (Patient not taking: Reported on 05/17/2022) 17 g 11   Continuous Blood Gluc Sensor (DEXCOM G6 SENSOR) MISC Use as directed. Change sensor every 10 days 9 each 3   lidocaine (LIDODERM) 5 % Apply 1 - 3 patches on to the skin as directed. 12 hours on and 12 hours off. (Patient not taking: Reported on 05/17/2022) 90 patch 2   metoCLOPramide (REGLAN) 10 MG tablet Take 10 mg by mouth daily as needed for nausea or vomiting (upset stomach).     Na Sulfate-K Sulfate-Mg Sulf 17.5-3.13-1.6 GM/177ML SOLN Take by mouth as directed 354 mL 0   nitroGLYCERIN (NITROSTAT) 0.4 MG SL tablet Place 0.4 mg under the tongue every 5 (five) minutes x 3 doses as needed for chest pain.      Results for orders placed or performed during the hospital encounter of 07/02/22 (from the past 48 hour(s))  Glucose, capillary     Status: Abnormal   Collection Time: 07/02/22  8:07 AM  Result Value Ref Range   Glucose-Capillary 117 (H) 70 - 99 mg/dL    Comment: Glucose reference range applies only to samples taken after fasting for at least 8 hours.   No results found.  Review of Systems  Constitutional: Negative.   Respiratory: Negative.      Blood pressure 135/72, pulse 69, temperature 97.9 F (36.6 C), temperature source Oral, resp. rate 17, height 5\' 8"  (1.727 m),  weight 89.1 kg, SpO2 98 %. Physical Exam Constitutional:      Appearance: Normal appearance.  Cardiovascular:     Rate and Rhythm: Normal rate.  Pulmonary:     Effort: Pulmonary effort is normal.  Musculoskeletal:     Cervical back: Normal range of motion.  Neurological:     Mental Status: He is alert.      Assessment/Plan Patient admitted for hypoglossal nerve stimulation-[employer] under general anesthesia as an outpatient.  Jerrell Belfast, MD 07/02/2022, 8:22 AM

## 2022-07-02 NOTE — Progress Notes (Signed)
In preoperative succussion with the patient and his wife he was instructed to take his Plavix right up until the time of surgery.  He has been on anticoagulation and unfortunately is at significantly increased risk of intraoperative and postoperative bleeding.  Given the elective nature of his surgical procedure his surgery was canceled today.  We will reschedule at his earliest convenience and he will hold his Plavix 1 week prior to his surgical procedure.  The patient underwent cardiology preop evaluation and they referred back to a note prior to his drug-induced sleep endoscopy that no Plavix hold was necessary.  We will also undertake instruction with the preoperative screening nurses and surgical center staff as well as the patient's cardiology group.  Early Chars Itali Mckendry MD

## 2022-07-12 ENCOUNTER — Encounter (HOSPITAL_COMMUNITY): Payer: Self-pay

## 2022-07-12 ENCOUNTER — Encounter (HOSPITAL_BASED_OUTPATIENT_CLINIC_OR_DEPARTMENT_OTHER): Payer: Self-pay | Admitting: Emergency Medicine

## 2022-07-12 ENCOUNTER — Inpatient Hospital Stay (HOSPITAL_BASED_OUTPATIENT_CLINIC_OR_DEPARTMENT_OTHER)
Admission: EM | Admit: 2022-07-12 | Discharge: 2022-07-15 | DRG: 390 | Disposition: A | Discharge status: Home / Self Care | Payer: No Typology Code available for payment source | Attending: Internal Medicine | Admitting: Internal Medicine

## 2022-07-12 ENCOUNTER — Other Ambulatory Visit: Payer: Self-pay

## 2022-07-12 ENCOUNTER — Emergency Department (HOSPITAL_BASED_OUTPATIENT_CLINIC_OR_DEPARTMENT_OTHER): Payer: No Typology Code available for payment source

## 2022-07-12 DIAGNOSIS — F419 Anxiety disorder, unspecified: Secondary | ICD-10-CM | POA: Diagnosis present

## 2022-07-12 DIAGNOSIS — Z820 Family history of epilepsy and other diseases of the nervous system: Secondary | ICD-10-CM

## 2022-07-12 DIAGNOSIS — E785 Hyperlipidemia, unspecified: Secondary | ICD-10-CM | POA: Diagnosis present

## 2022-07-12 DIAGNOSIS — K56600 Partial intestinal obstruction, unspecified as to cause: Principal | ICD-10-CM | POA: Diagnosis present

## 2022-07-12 DIAGNOSIS — Z825 Family history of asthma and other chronic lower respiratory diseases: Secondary | ICD-10-CM | POA: Diagnosis not present

## 2022-07-12 DIAGNOSIS — Z800 Family history of malignant neoplasm of digestive organs: Secondary | ICD-10-CM

## 2022-07-12 DIAGNOSIS — Z9641 Presence of insulin pump (external) (internal): Secondary | ICD-10-CM | POA: Diagnosis present

## 2022-07-12 DIAGNOSIS — Z7902 Long term (current) use of antithrombotics/antiplatelets: Secondary | ICD-10-CM

## 2022-07-12 DIAGNOSIS — K56609 Unspecified intestinal obstruction, unspecified as to partial versus complete obstruction: Secondary | ICD-10-CM | POA: Diagnosis present

## 2022-07-12 DIAGNOSIS — Z7982 Long term (current) use of aspirin: Secondary | ICD-10-CM

## 2022-07-12 DIAGNOSIS — K529 Noninfective gastroenteritis and colitis, unspecified: Secondary | ICD-10-CM | POA: Diagnosis present

## 2022-07-12 DIAGNOSIS — E1165 Type 2 diabetes mellitus with hyperglycemia: Secondary | ICD-10-CM | POA: Diagnosis present

## 2022-07-12 DIAGNOSIS — Z806 Family history of leukemia: Secondary | ICD-10-CM

## 2022-07-12 DIAGNOSIS — E119 Type 2 diabetes mellitus without complications: Secondary | ICD-10-CM | POA: Diagnosis present

## 2022-07-12 DIAGNOSIS — Z955 Presence of coronary angioplasty implant and graft: Secondary | ICD-10-CM | POA: Diagnosis not present

## 2022-07-12 DIAGNOSIS — I2510 Atherosclerotic heart disease of native coronary artery without angina pectoris: Secondary | ICD-10-CM | POA: Diagnosis present

## 2022-07-12 DIAGNOSIS — Z888 Allergy status to other drugs, medicaments and biological substances status: Secondary | ICD-10-CM

## 2022-07-12 DIAGNOSIS — I959 Hypotension, unspecified: Secondary | ICD-10-CM | POA: Diagnosis present

## 2022-07-12 DIAGNOSIS — F32A Depression, unspecified: Secondary | ICD-10-CM | POA: Diagnosis present

## 2022-07-12 DIAGNOSIS — Z79899 Other long term (current) drug therapy: Secondary | ICD-10-CM

## 2022-07-12 DIAGNOSIS — Z818 Family history of other mental and behavioral disorders: Secondary | ICD-10-CM

## 2022-07-12 DIAGNOSIS — Z794 Long term (current) use of insulin: Secondary | ICD-10-CM | POA: Diagnosis not present

## 2022-07-12 DIAGNOSIS — Z9109 Other allergy status, other than to drugs and biological substances: Secondary | ICD-10-CM

## 2022-07-12 DIAGNOSIS — K219 Gastro-esophageal reflux disease without esophagitis: Secondary | ICD-10-CM | POA: Diagnosis present

## 2022-07-12 DIAGNOSIS — Z8719 Personal history of other diseases of the digestive system: Secondary | ICD-10-CM | POA: Diagnosis not present

## 2022-07-12 DIAGNOSIS — E1169 Type 2 diabetes mellitus with other specified complication: Secondary | ICD-10-CM | POA: Diagnosis present

## 2022-07-12 DIAGNOSIS — Z85828 Personal history of other malignant neoplasm of skin: Secondary | ICD-10-CM

## 2022-07-12 DIAGNOSIS — K5669 Other partial intestinal obstruction: Secondary | ICD-10-CM | POA: Diagnosis present

## 2022-07-12 DIAGNOSIS — Z8249 Family history of ischemic heart disease and other diseases of the circulatory system: Secondary | ICD-10-CM | POA: Diagnosis not present

## 2022-07-12 DIAGNOSIS — M797 Fibromyalgia: Secondary | ICD-10-CM | POA: Diagnosis present

## 2022-07-12 DIAGNOSIS — Z880 Allergy status to penicillin: Secondary | ICD-10-CM

## 2022-07-12 DIAGNOSIS — K567 Ileus, unspecified: Secondary | ICD-10-CM | POA: Diagnosis present

## 2022-07-12 DIAGNOSIS — Z87891 Personal history of nicotine dependence: Secondary | ICD-10-CM | POA: Diagnosis not present

## 2022-07-12 DIAGNOSIS — I10 Essential (primary) hypertension: Secondary | ICD-10-CM | POA: Diagnosis present

## 2022-07-12 DIAGNOSIS — Z981 Arthrodesis status: Secondary | ICD-10-CM

## 2022-07-12 DIAGNOSIS — Z91040 Latex allergy status: Secondary | ICD-10-CM

## 2022-07-12 DIAGNOSIS — R000 Tachycardia, unspecified: Secondary | ICD-10-CM | POA: Diagnosis present

## 2022-07-12 LAB — COMPREHENSIVE METABOLIC PANEL
ALT: 19 U/L (ref 0–44)
AST: 26 U/L (ref 15–41)
Albumin: 4.9 g/dL (ref 3.5–5.0)
Alkaline Phosphatase: 82 U/L (ref 38–126)
Anion gap: 11 (ref 5–15)
BUN: 18 mg/dL (ref 8–23)
CO2: 27 mmol/L (ref 22–32)
Calcium: 9.7 mg/dL (ref 8.9–10.3)
Chloride: 101 mmol/L (ref 98–111)
Creatinine, Ser: 0.96 mg/dL (ref 0.61–1.24)
GFR, Estimated: >60 mL/min (ref >60)
Glucose, Bld: 192 mg/dL — ABNORMAL HIGH (ref 70–99)
Potassium: 3.6 mmol/L (ref 3.5–5.1)
Sodium: 139 mmol/L (ref 135–145)
Total Bilirubin: 1.9 mg/dL — ABNORMAL HIGH (ref 0.3–1.2)
Total Protein: 9.1 g/dL — ABNORMAL HIGH (ref 6.5–8.1)

## 2022-07-12 LAB — CBC
HCT: 55.3 % — ABNORMAL HIGH (ref 39.0–52.0)
Hemoglobin: 19.4 g/dL — ABNORMAL HIGH (ref 13.0–17.0)
MCH: 32.8 pg (ref 26.0–34.0)
MCHC: 35.1 g/dL (ref 30.0–36.0)
MCV: 93.4 fL (ref 80.0–100.0)
Platelets: 215 10*3/uL (ref 150–400)
RBC: 5.92 MIL/uL — ABNORMAL HIGH (ref 4.22–5.81)
RDW: 12 % (ref 11.5–15.5)
WBC: 14 10*3/uL — ABNORMAL HIGH (ref 4.0–10.5)
nRBC: 0 % (ref 0.0–0.2)

## 2022-07-12 LAB — LIPASE, BLOOD: Lipase: 37 U/L (ref 11–51)

## 2022-07-12 LAB — URINALYSIS, MICROSCOPIC (REFLEX)

## 2022-07-12 LAB — URINALYSIS, ROUTINE W REFLEX MICROSCOPIC
Glucose, UA: >=500 mg/dL — AB
Ketones, ur: 80 mg/dL — AB
Leukocytes,Ua: NEGATIVE
Nitrite: NEGATIVE
Protein, ur: >=300 mg/dL — AB
Specific Gravity, Urine: >=1.03 (ref 1.005–1.030)
pH: 5.5 (ref 5.0–8.0)

## 2022-07-12 LAB — LACTIC ACID, PLASMA
Lactic Acid, Venous: 1.2 mmol/L (ref 0.5–1.9)
Lactic Acid, Venous: 1 mmol/L (ref 0.5–1.9)

## 2022-07-12 LAB — CK: Total CK: 84 U/L (ref 49–397)

## 2022-07-12 LAB — CBG MONITORING, ED: Glucose-Capillary: 124 mg/dL — ABNORMAL HIGH (ref 70–99)

## 2022-07-12 MED ORDER — SODIUM CHLORIDE 0.9 % IV BOLUS
1000.0000 mL | Freq: Once | INTRAVENOUS | Status: AC
  Administered 2022-07-12: 1000 mL via INTRAVENOUS

## 2022-07-12 MED ORDER — FENTANYL CITRATE PF 50 MCG/ML IJ SOSY
50.0000 ug | PREFILLED_SYRINGE | Freq: Once | INTRAMUSCULAR | Status: AC
  Administered 2022-07-12: 50 ug via INTRAVENOUS
  Filled 2022-07-12: qty 1

## 2022-07-12 MED ORDER — ONDANSETRON HCL 4 MG/2ML IJ SOLN
4.0000 mg | Freq: Once | INTRAMUSCULAR | Status: AC
  Administered 2022-07-12: 4 mg via INTRAVENOUS
  Filled 2022-07-12: qty 2

## 2022-07-12 MED ORDER — IOHEXOL 300 MG/ML  SOLN
100.0000 mL | Freq: Once | INTRAMUSCULAR | Status: AC | PRN
  Administered 2022-07-12: 100 mL via INTRAVENOUS

## 2022-07-12 NOTE — ED Provider Triage Note (Signed)
Emergency Medicine Provider Triage Evaluation Note  Brandon [name] , a 66 y.o. male  was evaluated in triage.  Pt complains of abdominal pain, nausea, vomiting, diarrhea.  States pain started last night.  Pain is generalized and he rates it as severe.  Has had multiple episodes of nausea, and 2 episodes of nonbloody emesis.  Has an extensive history of abdominal surgeries and states this feels exactly the same as when he has had bowel obstructions in the past.  Also reports subjective fevers.  Denies dysuria, frequency or urgency, hematochezia.  States his bowel movements have been watery today and very dark.  Has had 2 episodes of diarrhea today.  Review of Systems  Positive: As above Negative: As above  Physical Exam  BP (!) 136/102   Pulse (!) 117   Temp 99 F (37.2 C) (Oral)   Resp 18   Ht 5\' 9"  (1.753 m)   Wt 83.9 kg   SpO2 96%   BMI 27.32 kg/m  Gen:   Awake, moderate distress   Resp:  Normal effort  MSK:   Moves extremities without difficulty  Other:  Abdomen appears distended the patient is sitting upright in a wheelchair.  Generalized tenderness to the abdomen  Medical Decision Making  Medically screening exam initiated at 6:42 PM.  Appropriate orders placed.  Brandon [name] was informed that the remainder of the evaluation will be completed by another provider, this initial triage assessment does not replace that evaluation, and the importance of remaining in the ED until their evaluation is complete.  Patient had abdominal pain labs resulted by the time of MSE.  Does have pan cytosis with hemoglobin of 19.4 which is significantly higher than his baseline of 14.  Also has a leukocytosis of 14.  Urinalysis significant for greater than 300 protein, moderate bilirubin, trace hemoglobin, 80 ketones.  No anion gap acidosis.  Will obtain CK and CT abdomen pelvis   Brandon Hutching, PA-C 07/12/22 1855

## 2022-07-12 NOTE — Progress Notes (Addendum)
Plan of Care Note for accepted transfer   Patient: Brandon [name] MRN: [id number]   DOA: 07/12/2022  Facility requesting transfer: [facility] Requesting Provider: Glynn Octave, MD Reason for transfer: Admission for ileus versus small bowel enteritis versus SBO Facility course: Brandon [name] is a 66 y.o. male with history of diabetes mellitus on insulin pump, hypertension, coronary artery disease status post 3 stents on Plavix, sleep apnea, hyperlipidemia presenting with diffuse abdominal pain, vomiting and diarrhea that started last night, with progressively worsening diffuse abdominal pain feels similar to previous bowel obstructions.  He had 1 large bowel movement last night and some diarrhea today but not passing any further gas.  No fever, chills, chest pain or shortness of breath.  Blood sugars have been well controlled.  He had multiple previous laparotomies.  It was not thought to require NG tube at this time as his vomiting was better.  When he came to the ER, BP was 136/102 with heart rate of 117 and otherwise normal vital signs.  BP later on was down to 139/72 with a heart rate of 84.  Labs revealed a blood glucose of 192 and total protein of 9.1 with total bili 1.9 otherwise unremarkable CMP.  CBC showed leukocytosis of 14 with hemoconcentration.  Abdominal and pelvic CT scan revealed a single mildly prominent loop of small bowel in the left mid abdomen favoring adynamic small bowel ileus or small bowel enteritis over partial small bowel obstruction.  Plan of care: The patient is accepted for admission to Med-surg  unit, at [facility]..  He will need  General surgery consultation.  The patient will be under the care and responsibility of the ER physician till he arrives to [facility].  Author: Hannah Beat, MD 07/12/2022  Check [email or web address] for on-call coverage.  Nursing staff, Please call [facility] Admits & Consults System-Wide number on Amion as soon as patient's arrival, so  appropriate admitting provider can evaluate the pt.

## 2022-07-12 NOTE — ED Triage Notes (Signed)
Mid abdominal pain , NVD x 1 day , Hx bowel obstruction, reports feels same .

## 2022-07-12 NOTE — ED Provider Notes (Signed)
[facility] EMERGENCY DEPARTMENT Provider Note   CSN: [id number] Arrival date & time: 07/12/22  1455     History  Chief Complaint  Patient presents with   Abdominal Pain    Brandon [name] is a 66 y.o. male.  Patient with history of diabetes with insulin pump, hypertension, coronary artery disease status post 3 stents on Plavix, sleep apnea, hyperlipidemia presenting with diffuse abdominal pain, vomiting and diarrhea.  Symptoms onset last night.  Progressively worsening diffuse abdominal pain feels similar to previous bowel obstructions.  Had 1 large bowel movement last night and some diarrhea today but not passing any further gas.  No fever, chills, chest pain or shortness of breath.  Blood sugars have been well controlled.  Feels similar to last bowel obstruction he had in May.  Multiple previous abdominal surgeries.  The history is provided by the patient.  Abdominal Pain Associated symptoms: diarrhea, nausea and vomiting   Associated symptoms: no dysuria, no fever and no shortness of breath        Home Medications Prior to Admission medications   Medication Sig Start Date End Date Taking? Authorizing Provider  acetaminophen (TYLENOL) 500 MG tablet Take 1,000 mg by mouth See admin instructions. Take 2 tablets (1000 mg) by mouth scheduled in the morning & may take 2 tablets (1000 mg) by mouth every 6 hours if needed for pain.    [provider]  albuterol (VENTOLIN HFA) 108 (90 Base) MCG/ACT inhaler Inhale 2 puffs by mouth  into the lungs every 6 (six) hours as needed for wheezing. Patient not taking: Reported on 05/17/2022 10/29/21   Monica Bectonhekkekandam, Thomas J, MD  ARIPiprazole (ABILIFY) 10 MG tablet Take 1 tablet (10 mg total) by mouth every morning. 05/02/22   Everrett CoombeMatthews, Cody, DO  aspirin 81 MG EC tablet Take 81 mg by mouth every morning.    [provider]  atorvastatin (LIPITOR) 80 MG tablet Take 1 tablet (80 mg total) by mouth daily. 05/20/22   Meriam SpraguePemberton,  Heather E, MD  b complex vitamins capsule Take 1 capsule by mouth in the morning.    [provider]  buprenorphine (BUTRANS) 15 MCG/HR Place 1 patch onto the skin once a week. Patient taking differently: Place 1 patch onto the skin every Friday. 05/02/22 07/26/22  Edward JollyLateef, Bilal, MD  buPROPion (WELLBUTRIN XL) 300 MG 24 hr tablet Take 1 tablet (300 mg total) by mouth daily. 06/04/22   Everrett CoombeMatthews, Cody, DO  clopidogrel (PLAVIX) 75 MG tablet TAKE 1 TABLET (75 MG TOTAL) BY MOUTH DAILY WITH BREAKFAST. 07/17/21 09/05/22  Meriam SpraguePemberton, Heather E, MD  Continuous Blood Gluc Sensor (DEXCOM G6 SENSOR) MISC Use as directed. Change sensor every 10 days 01/02/22   Shamleffer, Konrad DoloresIbtehal Jaralla, MD  docusate sodium (COLACE) 100 MG capsule Take 200 mg by mouth in the morning.    [provider]  empagliflozin (JARDIANCE) 25 MG TABS tablet Take 1 tablet (25 mg total) by mouth daily. 11/08/21   Everrett CoombeMatthews, Cody, DO  GVOKE HYPOPEN 1-PACK 1 MG/0.2ML SOAJ Use as needed for hypoglycemia 05/13/22   Carlus PavlovGherghe, Cristina, MD  insulin lispro (HUMALOG) 100 UNIT/ML injection For use in pump, total of 60 units per day 10/19/21   Romero BellingEllison, Sean, MD  Iron, Ferrous Sulfate, 325 (65 Fe) MG TABS Take 1 tablet (325 mg) by mouth daily. 02/01/22   Everrett CoombeMatthews, Cody, DO  isosorbide mononitrate (IMDUR) 30 MG 24 hr tablet Take 1 tablet (30 mg total) by mouth daily. 04/30/22   Meriam SpraguePemberton, Heather E,  MD  lidocaine (LIDODERM) 5 % Apply 1 - 3 patches on to the skin as directed. 12 hours on and 12 hours off. Patient not taking: Reported on 05/17/2022 08/10/21     magnesium oxide (MAG-OX) 400 (240 Mg) MG tablet Take 800 mg by mouth in the morning.    [provider]  metoCLOPramide (REGLAN) 10 MG tablet Take 10 mg by mouth daily as needed for nausea or vomiting (upset stomach).    [provider]  metoprolol succinate (TOPROL XL) 25 MG 24 hr tablet Take 1 tablet (25 mg total) by mouth daily. 10/24/21   Meriam Sprague, MD  Na  Sulfate-K Sulfate-Mg Sulf 17.5-3.13-1.6 GM/177ML SOLN Take by mouth as directed 03/13/22   Jenel Lucks, MD  nitroGLYCERIN (NITROSTAT) 0.4 MG SL tablet Place 0.4 mg under the tongue every 5 (five) minutes x 3 doses as needed for chest pain.    [provider]  ondansetron (ZOFRAN-ODT) 4 MG disintegrating tablet Take 1 tablet (4 mg total) by mouth every 8 (eight) hours as needed for nausea or vomiting. 05/02/22   Everrett Coombe, DO  pantoprazole (PROTONIX) 40 MG tablet Take 1 tablet (40 mg total) by mouth daily. 06/04/22   Everrett Coombe, DO  Semaglutide, 2 MG/DOSE, (OZEMPIC, 2 MG/DOSE,) 8 MG/3ML SOPN Inject 2 mg into the skin once a week. Patient taking differently: Inject 2 mg into the skin every Wednesday. 03/12/22   Carlus Pavlov, MD  tamsulosin (FLOMAX) 0.4 MG CAPS capsule Take 1 capsule (0.4 mg total) by mouth daily. 05/02/22   Everrett Coombe, DO  testosterone (ANDROGEL) 50 MG/5GM (1%) GEL Place 5 g (1 packet) onto the shoulders and upper arms daily as directed. 05/21/22 08/06/22  Everrett Coombe, DO  tiZANidine (ZANAFLEX) 4 MG tablet Take 1-1&1/2 tablets (4-6 mg total) by mouth every 8 (eight) hours as needed for muscle spasms. Patient taking differently: Take 8 mg by mouth in the morning. 02/05/22   Edward Jolly, MD      Allergies    Kiwi extract, Other, Penicillins, Ancef [cefazolin], Latex, Levaquin [levofloxacin], and Peach [prunus persica]    Review of Systems   Review of Systems  Constitutional:  Positive for activity change and appetite change. Negative for fever.  HENT:  Negative for congestion and rhinorrhea.   Respiratory:  Negative for chest tightness and shortness of breath.   Gastrointestinal:  Positive for abdominal pain, diarrhea, nausea and vomiting.  Genitourinary:  Negative for dysuria.  Musculoskeletal:  Negative for arthralgias and myalgias.  Skin:  Negative for rash.  Neurological:  Negative for weakness and headaches.   all other systems are negative  except as noted in the HPI and PMH.    Physical Exam Updated Vital Signs BP 129/86   Pulse (!) 116   Temp 98.8 F (37.1 C)   Resp 19   Ht 5\' 9"  (1.753 m)   Wt 83.9 kg   SpO2 95%   BMI 27.32 kg/m  Physical Exam Vitals and nursing note reviewed.  Constitutional:      General: He is not in acute distress.    Appearance: He is well-developed.  HENT:     Head: Normocephalic and atraumatic.     Mouth/Throat:     Pharynx: No oropharyngeal exudate.  Eyes:     Conjunctiva/sclera: Conjunctivae normal.     Pupils: Pupils are equal, round, and reactive to light.  Neck:     Comments: No meningismus. Cardiovascular:     Rate and Rhythm: Regular rhythm.  Tachycardia present.     Heart sounds: Normal heart sounds. No murmur heard. Pulmonary:     Effort: Pulmonary effort is normal. No respiratory distress.     Breath sounds: Normal breath sounds.  Abdominal:     Palpations: Abdomen is soft.     Tenderness: There is abdominal tenderness. There is no guarding or rebound.     Comments: Diffuse abdominal tenderness, no guarding or rebound  Musculoskeletal:        General: No tenderness. Normal range of motion.     Cervical back: Normal range of motion and neck supple.  Skin:    General: Skin is warm.  Neurological:     Mental Status: He is alert and oriented to person, place, and time.     Cranial Nerves: No cranial nerve deficit.     Motor: No abnormal muscle tone.     Coordination: Coordination normal.     Comments: No ataxia on finger to nose bilaterally. No pronator drift. 5/5 strength throughout. CN 2-12 intact.Equal grip strength. Sensation intact.   Psychiatric:        Behavior: Behavior normal.     ED Results / Procedures / Treatments   Labs (all labs ordered are listed, but only abnormal results are displayed) Labs Reviewed  COMPREHENSIVE METABOLIC PANEL - Abnormal; Notable for the following components:      Result Value   Glucose, Bld 192 (*)    Total Protein 9.1 (*)     Total Bilirubin 1.9 (*)    All other components within normal limits  CBC - Abnormal; Notable for the following components:   WBC 14.0 (*)    RBC 5.92 (*)    Hemoglobin 19.4 (*)    HCT 55.3 (*)    All other components within normal limits  URINALYSIS, ROUTINE W REFLEX MICROSCOPIC - Abnormal; Notable for the following components:   APPearance HAZY (*)    Glucose, UA >=500 (*)    Hgb urine dipstick TRACE (*)    Bilirubin Urine MODERATE (*)    Ketones, ur 80 (*)    Protein, ur >=300 (*)    All other components within normal limits  URINALYSIS, MICROSCOPIC (REFLEX) - Abnormal; Notable for the following components:   Bacteria, UA MANY (*)    All other components within normal limits  CBG MONITORING, ED - Abnormal; Notable for the following components:   Glucose-Capillary 124 (*)    All other components within normal limits  LIPASE, BLOOD  CK  LACTIC ACID, PLASMA  LACTIC ACID, PLASMA    EKG None  Radiology CT Abdomen Pelvis W Contrast  Result Date: 07/12/2022 CLINICAL DATA:  Abdominal pain, nausea/vomiting/diarrhea x1 day, history of bowel obstruction EXAM: CT ABDOMEN AND PELVIS WITH CONTRAST TECHNIQUE: Multidetector CT imaging of the abdomen and pelvis was performed using the standard protocol following bolus administration of intravenous contrast. RADIATION DOSE REDUCTION: This exam was performed according to the departmental dose-optimization program which includes automated exposure control, adjustment of the mA and/or kV according to patient size and/or use of iterative reconstruction technique. CONTRAST:  OMNIPAQUE IOHEXOL 300 MG/ML  SOLN COMPARISON:  12/30/2021 FINDINGS: Lower chest: Mild right basilar atelectasis. Hepatobiliary: Liver is within normal limits. Status post cholecystectomy. No intrahepatic or extrahepatic duct dilatation. Pancreas: Within normal limits. Spleen: Calcified splenic granuloma, benign. Adrenals/Urinary Tract: Adrenal glands are within normal  limits. 3.0 cm right upper pole renal cyst (series 2/image 21), measuring simple fluid density, benign (Bosniak I). No follow-up is recommended. Left kidney  is within normal limits. No hydronephrosis. Bladder is within normal limits. Stomach/Bowel: Stomach is within normal limits. A single mildly prominent loop of small bowel is present in the left mid abdomen (series 2/image 50). Colon is fluid-filled not decompressed. Overall, this appearance favors adynamic small bowel ileus or small bowel enteritis over partial small bowel obstruction. Normal appendix (series 2/image 48). No colonic wall thickening or inflammatory changes. Vascular/Lymphatic: No evidence of abdominal aortic aneurysm. Atherosclerotic calcifications of the abdominal aorta and branch vessels. No suspicious abdominopelvic lymphadenopathy. Reproductive: Prostatomegaly, with enlargement of the central gland, suggesting BPH. Penile prosthesis with reservoir beneath the left lower anterior abdominal wall (series 2/image 63). Other: No abdominopelvic ascites. Musculoskeletal: Status post PLIF at L4-5. Thoracic spine stimulator. Degenerative changes of the visualized thoracolumbar spine. IMPRESSION: A single mildly prominent loop of small bowel in the left mid abdomen, favoring adynamic small bowel ileus or small bowel enteritis over partial small bowel obstruction. Additional ancillary findings as above. Electronically Signed   By: Charline Bills M.D.   On: 07/12/2022 21:29    Procedures Procedures    Medications Ordered in ED Medications  sodium chloride 0.9 % bolus 1,000 mL (has no administration in time range)  ondansetron (ZOFRAN) injection 4 mg (has no administration in time range)  fentaNYL (SUBLIMAZE) injection 50 mcg (has no administration in time range)  ondansetron (ZOFRAN) injection 4 mg (4 mg Intravenous Given 07/12/22 1837)    ED Course/ Medical Decision Making/ A&P                           Medical Decision  Making Amount and/or Complexity of Data Reviewed Labs: ordered. Decision-making details documented in ED Course. Radiology: ordered and independent interpretation performed. Decision-making details documented in ED Course. ECG/medicine tests: ordered and independent interpretation performed. Decision-making details documented in ED Course.  Risk Prescription drug management. Decision regarding hospitalization.  Diffuse abdominal pain with vomiting similar to previous bowel obstructions.  Tachycardic on arrival abdomen soft without peritoneal signs.  IV fluids, pain and nausea control given.  Labs show hemoconcentration and leukocytosis.  Ketones in urine but anion gap is normal and blood sugar 192.  Creatinine is normal.  Lactate is normal.  CT scan is obtained given patient's history of recurrent small bowel obstructions and abdominal pain and vomiting.  CT shows some small dilated bowel loops but no frank obstruction may be ileus versus early partial small bowel obstruction.  Results reviewed interpreted by me.  Nausea increased pain but no vomiting throughout ED course.  We will hold off on NG tube at this time as he is not vomiting.  Plan duration admission for hydration and bowel rest given concerns for ileus partial small bowel obstruction.  Continue IV fluids and pain and nausea control.  Blood sugar is high but no evidence of DKA.  Does have ketones in urine but normal anion gap.  Discussed with Dr. Arville Care        Final Clinical Impression(s) / ED Diagnoses Final diagnoses:  Partial small bowel obstruction [facility])    Rx / DC Orders ED Discharge Orders     None         Glynn Octave, MD 07/12/22 2318

## 2022-07-13 ENCOUNTER — Inpatient Hospital Stay (HOSPITAL_COMMUNITY): Payer: No Typology Code available for payment source

## 2022-07-13 ENCOUNTER — Encounter (HOSPITAL_COMMUNITY): Payer: Self-pay | Admitting: Internal Medicine

## 2022-07-13 DIAGNOSIS — K567 Ileus, unspecified: Secondary | ICD-10-CM | POA: Diagnosis not present

## 2022-07-13 DIAGNOSIS — K56600 Partial intestinal obstruction, unspecified as to cause: Secondary | ICD-10-CM

## 2022-07-13 LAB — GLUCOSE, CAPILLARY
Glucose-Capillary: 107 mg/dL — ABNORMAL HIGH (ref 70–99)
Glucose-Capillary: 120 mg/dL — ABNORMAL HIGH (ref 70–99)
Glucose-Capillary: 129 mg/dL — ABNORMAL HIGH (ref 70–99)
Glucose-Capillary: 149 mg/dL — ABNORMAL HIGH (ref 70–99)
Glucose-Capillary: 86 mg/dL (ref 70–99)
Glucose-Capillary: 87 mg/dL (ref 70–99)

## 2022-07-13 LAB — COMPREHENSIVE METABOLIC PANEL
ALT: 14 U/L (ref 0–44)
AST: 18 U/L (ref 15–41)
Albumin: 3.6 g/dL (ref 3.5–5.0)
Alkaline Phosphatase: 56 U/L (ref 38–126)
Anion gap: 11 (ref 5–15)
BUN: 13 mg/dL (ref 8–23)
CO2: 27 mmol/L (ref 22–32)
Calcium: 8.9 mg/dL (ref 8.9–10.3)
Chloride: 104 mmol/L (ref 98–111)
Creatinine, Ser: 0.85 mg/dL (ref 0.61–1.24)
GFR, Estimated: >60 mL/min (ref >60)
Glucose, Bld: 129 mg/dL — ABNORMAL HIGH (ref 70–99)
Potassium: 3.7 mmol/L (ref 3.5–5.1)
Sodium: 142 mmol/L (ref 135–145)
Total Bilirubin: 1 mg/dL (ref 0.3–1.2)
Total Protein: 6.1 g/dL — ABNORMAL LOW (ref 6.5–8.1)

## 2022-07-13 LAB — CBC WITH DIFFERENTIAL/PLATELET
Abs Immature Granulocytes: 0.01 10*3/uL (ref 0.00–0.07)
Basophils Absolute: 0 10*3/uL (ref 0.0–0.1)
Basophils Relative: 0 %
Eosinophils Absolute: 0.1 10*3/uL (ref 0.0–0.5)
Eosinophils Relative: 1 %
HCT: 45.3 % (ref 39.0–52.0)
Hemoglobin: 15.9 g/dL (ref 13.0–17.0)
Immature Granulocytes: 0 %
Lymphocytes Relative: 17 %
Lymphs Abs: 1.3 10*3/uL (ref 0.7–4.0)
MCH: 33.3 pg (ref 26.0–34.0)
MCHC: 35.1 g/dL (ref 30.0–36.0)
MCV: 94.8 fL (ref 80.0–100.0)
Monocytes Absolute: 1 10*3/uL (ref 0.1–1.0)
Monocytes Relative: 13 %
Neutro Abs: 5.3 10*3/uL (ref 1.7–7.7)
Neutrophils Relative %: 69 %
Platelets: 167 10*3/uL (ref 150–400)
RBC: 4.78 MIL/uL (ref 4.22–5.81)
RDW: 12.2 % (ref 11.5–15.5)
WBC: 7.7 10*3/uL (ref 4.0–10.5)
nRBC: 0 % (ref 0.0–0.2)

## 2022-07-13 LAB — HIV ANTIBODY (ROUTINE TESTING W REFLEX): HIV Screen 4th Generation wRfx: NONREACTIVE

## 2022-07-13 LAB — PHOSPHORUS: Phosphorus: 3.2 mg/dL (ref 2.5–4.6)

## 2022-07-13 LAB — MAGNESIUM: Magnesium: 1.9 mg/dL (ref 1.7–2.4)

## 2022-07-13 MED ORDER — ACETAMINOPHEN 500 MG PO TABS: 1000.0000 mg | ORAL_TABLET | ORAL | Status: DC

## 2022-07-13 MED ORDER — BUPROPION HCL ER (XL) 150 MG PO TB24
300.0000 mg | ORAL_TABLET | Freq: Every day | ORAL | Status: DC
  Administered 2022-07-14 – 2022-07-15 (×2): 300 mg via ORAL
  Filled 2022-07-13 (×2): qty 2

## 2022-07-13 MED ORDER — ACETAMINOPHEN 500 MG PO TABS
1000.0000 mg | ORAL_TABLET | Freq: Every day | ORAL | Status: DC
  Administered 2022-07-14 – 2022-07-15 (×2): 1000 mg via ORAL
  Filled 2022-07-13 (×2): qty 2

## 2022-07-13 MED ORDER — TAMSULOSIN HCL 0.4 MG PO CAPS
0.4000 mg | ORAL_CAPSULE | Freq: Every day | ORAL | Status: DC
  Administered 2022-07-14 – 2022-07-15 (×2): 0.4 mg via ORAL
  Filled 2022-07-13 (×2): qty 1

## 2022-07-13 MED ORDER — HYDROMORPHONE HCL 1 MG/ML IJ SOLN
0.5000 mg | INTRAMUSCULAR | Status: DC | PRN
  Administered 2022-07-13 – 2022-07-15 (×8): 0.5 mg via INTRAVENOUS
  Filled 2022-07-13 (×8): qty 0.5

## 2022-07-13 MED ORDER — ARIPIPRAZOLE 10 MG PO TABS
10.0000 mg | ORAL_TABLET | Freq: Every morning | ORAL | Status: DC
  Administered 2022-07-14 – 2022-07-15 (×2): 10 mg via ORAL
  Filled 2022-07-13 (×2): qty 1

## 2022-07-13 MED ORDER — LACTATED RINGERS IV SOLN: INTRAVENOUS | Status: DC

## 2022-07-13 MED ORDER — ACETAMINOPHEN 500 MG PO TABS: 1000.0000 mg | ORAL_TABLET | Freq: Four times a day (QID) | ORAL | Status: DC | PRN

## 2022-07-13 MED ORDER — ATORVASTATIN CALCIUM 80 MG PO TABS
80.0000 mg | ORAL_TABLET | Freq: Every day | ORAL | Status: DC
  Administered 2022-07-14 – 2022-07-15 (×2): 80 mg via ORAL
  Filled 2022-07-13 (×2): qty 1

## 2022-07-13 MED ORDER — INSULIN ASPART 100 UNIT/ML IJ SOLN
0.0000 [IU] | INTRAMUSCULAR | Status: DC
  Administered 2022-07-13: 1 [IU] via SUBCUTANEOUS

## 2022-07-13 MED ORDER — SODIUM CHLORIDE 0.9 % IV SOLN: INTRAVENOUS | Status: DC

## 2022-07-13 MED ORDER — DIATRIZOATE MEGLUMINE & SODIUM 66-10 % PO SOLN
90.0000 mL | Freq: Once | ORAL | Status: AC
  Administered 2022-07-13: 90 mL via NASOGASTRIC
  Filled 2022-07-13: qty 90

## 2022-07-13 MED ORDER — METOPROLOL SUCCINATE ER 25 MG PO TB24
12.5000 mg | ORAL_TABLET | Freq: Every day | ORAL | Status: DC
  Administered 2022-07-14 – 2022-07-15 (×2): 12.5 mg via ORAL
  Filled 2022-07-13 (×2): qty 1

## 2022-07-13 MED ORDER — ENOXAPARIN SODIUM 40 MG/0.4ML IJ SOSY
40.0000 mg | PREFILLED_SYRINGE | Freq: Every day | INTRAMUSCULAR | Status: DC
  Administered 2022-07-13 – 2022-07-14 (×2): 40 mg via SUBCUTANEOUS
  Filled 2022-07-13 (×2): qty 0.4

## 2022-07-13 MED ORDER — HYDROMORPHONE HCL 1 MG/ML IJ SOLN
1.0000 mg | INTRAMUSCULAR | Status: AC
  Administered 2022-07-13: 1 mg via INTRAVENOUS
  Filled 2022-07-13: qty 1

## 2022-07-13 MED ORDER — BISACODYL 10 MG RE SUPP: 10.0000 mg | Freq: Every day | RECTAL | Status: DC | PRN

## 2022-07-13 MED ORDER — BUPRENORPHINE 7.5 MCG/HR TD PTWK
2.0000 | MEDICATED_PATCH | TRANSDERMAL | Status: DC
  Administered 2022-07-13: 2 via TRANSDERMAL

## 2022-07-13 MED ORDER — BUPRENORPHINE 15 MCG/HR TD PTWK
1.0000 | MEDICATED_PATCH | TRANSDERMAL | Status: DC
  Filled 2022-07-13: qty 1

## 2022-07-13 MED ORDER — PROCHLORPERAZINE EDISYLATE 10 MG/2ML IJ SOLN: 5.0000 mg | Freq: Four times a day (QID) | INTRAMUSCULAR | Status: DC | PRN

## 2022-07-13 NOTE — Progress Notes (Signed)
[facility] PROGRESS NOTE    Progress Note  Brandon [name]  TMY:[id number] DOB: 16-Mar-1956 DOA: 07/12/2022 PCP: Everrett Coombe, DO     Brief Narrative:   Brandon [name] is an 66 y.o. male past medical history significant for prior small bowel obstruction, x3 abdominal surgeries, diabetes mellitus type 2 history of PCI with stenting comes into the ED for abdominal pain nausea and vomiting last bowel movement was 2 days prior to admission, in the ED mild leukocytosis abdominal and pelvic CT showed prominent dilated loops question small bowel otherwise stable.   Assessment/Plan:   SBO (small bowel obstruction) (HCC): Started on IV fluids, suppository laxatives. Try to keep potassium greater than 4, magnesium greater than 2. Avoid narcotics, encourage ambulation. She is not vomiting does not require NG tube. Ambulate is much as possible. Started on a small bowel protocol avoid NG tube.  Primary hypertension Seems to be stable continue to monitor allow medications once he is able to take orals.  Type 2 diabetes mellitus with other specified complication (HCC) Currently n.p.o. on an slim pump.  Patient is managing it.  Hyperlipidemia associated with type 2 diabetes mellitus (HCC) Hold statins.  Chronic anxiety/depression: Can resume home regimen once no longer is n.p.o.   DVT prophylaxis: lovenox Family Communication:none Status is: Inpatient Remains inpatient appropriate because: Small bowel obstruction    Code Status:     Code Status Orders  (From admission, onward)           Start     Ordered   07/13/22 0342  Full code  Continuous        07/13/22 0341           Code Status History     Date Active Date Inactive Code Status Order ID Comments User Context   12/30/2021 0947 01/02/2022 1919 Full Code [id number]  Jonah Blue, MD Inpatient   12/30/2021 0530 12/30/2021 0947 Full Code [id number]  Angie Fava, DO Inpatient   11/01/2021 0228 11/02/2021 2017  Full Code [id number]  Anselm Jungling, DO ED   01/17/2021 1517 01/18/2021 1829 Full Code [id number]  Kathleene Hazel, MD Inpatient   08/12/2020 1710 08/16/2020 1937 Full Code [id number]  Emeline General, MD Inpatient   04/14/2020 2001 04/17/2020 1710 Full Code [id number]  Charlsie Quest, MD Inpatient   04/07/2020 0730 04/12/2020 1710 Full Code [id number]  Marinda Elk, MD Inpatient         IV Access:   Peripheral IV   Procedures and diagnostic studies:   CT Abdomen Pelvis W Contrast  Result Date: 07/12/2022 CLINICAL DATA:  Abdominal pain, nausea/vomiting/diarrhea x1 day, history of bowel obstruction EXAM: CT ABDOMEN AND PELVIS WITH CONTRAST TECHNIQUE: Multidetector CT imaging of the abdomen and pelvis was performed using the standard protocol following bolus administration of intravenous contrast. RADIATION DOSE REDUCTION: This exam was performed according to the departmental dose-optimization program which includes automated exposure control, adjustment of the mA and/or kV according to patient size and/or use of iterative reconstruction technique. CONTRAST:  OMNIPAQUE IOHEXOL 300 MG/ML  SOLN COMPARISON:  12/30/2021 FINDINGS: Lower chest: Mild right basilar atelectasis. Hepatobiliary: Liver is within normal limits. Status post cholecystectomy. No intrahepatic or extrahepatic duct dilatation. Pancreas: Within normal limits. Spleen: Calcified splenic granuloma, benign. Adrenals/Urinary Tract: Adrenal glands are within normal limits. 3.0 cm right upper pole renal cyst (series 2/image 21), measuring simple fluid density, benign (Bosniak I). No follow-up is recommended. Left kidney is within normal limits. No hydronephrosis. Bladder  is within normal limits. Stomach/Bowel: Stomach is within normal limits. A single mildly prominent loop of small bowel is present in the left mid abdomen (series 2/image 50). Colon is fluid-filled not decompressed. Overall, this appearance favors adynamic small  bowel ileus or small bowel enteritis over partial small bowel obstruction. Normal appendix (series 2/image 48). No colonic wall thickening or inflammatory changes. Vascular/Lymphatic: No evidence of abdominal aortic aneurysm. Atherosclerotic calcifications of the abdominal aorta and branch vessels. No suspicious abdominopelvic lymphadenopathy. Reproductive: Prostatomegaly, with enlargement of the central gland, suggesting BPH. Penile prosthesis with reservoir beneath the left lower anterior abdominal wall (series 2/image 63). Other: No abdominopelvic ascites. Musculoskeletal: Status post PLIF at L4-5. Thoracic spine stimulator. Degenerative changes of the visualized thoracolumbar spine. IMPRESSION: A single mildly prominent loop of small bowel in the left mid abdomen, favoring adynamic small bowel ileus or small bowel enteritis over partial small bowel obstruction. Additional ancillary findings as above. Electronically Signed   By: Julian Hy M.D.   On: 07/12/2022 21:29     Medical Consultants:   None.   Subjective:    Brandon [name] denies any abdominal pain relates he feels much better than yesterday.  Objective:    Vitals:   07/12/22 2215 07/12/22 2230 07/12/22 2347 07/13/22 0441  BP:  137/74 139/77 133/66  Pulse: 84 87 82 85  Resp:  15 18 17   Temp:   98.8 F (37.1 C) 98.1 F (36.7 C)  TempSrc:   Oral Oral  SpO2: 99% 99% 96% 100%  Weight:      Height:       SpO2: 100 %   Intake/Output Summary (Last 24 hours) at 07/13/2022 0739 Last data filed at 07/13/2022 0340 Gross per 24 hour  Intake 100.78 ml  Output --  Net 100.78 ml   Filed Weights   07/12/22 1532  Weight: 83.9 kg    Exam: General exam: In no acute distress. Respiratory system: Good air movement and clear to auscultation. Cardiovascular system: S1 & S2 heard, RRR. No JVD. Gastrointestinal system: Abdomen is nondistended, soft and nontender.  Extremities: No pedal edema. Skin: No rashes, lesions or  ulcers Psychiatry: Judgement and insight appear normal. Mood & affect appropriate.    Data Reviewed:    Labs: Basic Metabolic Panel: Recent Labs  Lab 07/12/22 1604 07/13/22 0159  NA 139 142  K 3.6 3.7  CL 101 104  CO2 27 27  GLUCOSE 192* 129*  BUN 18 13  CREATININE 0.96 0.85  CALCIUM 9.7 8.9  MG  --  1.9  PHOS  --  3.2   GFR Estimated Creatinine Clearance: 85.5 mL/min (by C-G formula based on SCr of 0.85 mg/dL). Liver Function Tests: Recent Labs  Lab 07/12/22 1604 07/13/22 0159  AST 26 18  ALT 19 14  ALKPHOS 82 56  BILITOT 1.9* 1.0  PROT 9.1* 6.1*  ALBUMIN 4.9 3.6   Recent Labs  Lab 07/12/22 1604  LIPASE 37   No results for input(s): "AMMONIA" in the last 168 hours. Coagulation profile No results for input(s): "INR", "PROTIME" in the last 168 hours. COVID-19 Labs  No results for input(s): "DDIMER", "FERRITIN", "LDH", "CRP" in the last 72 hours.  Lab Results  Component Value Date   SARSCOV2NAA NEGATIVE 10/31/2021   SARSCOV2NAA NEGATIVE 10/22/2021   SARSCOV2NAA NEGATIVE 08/11/2020   [place] NEGATIVE 04/14/2020    CBC: Recent Labs  Lab 07/12/22 1604 07/13/22 0159  WBC 14.0* 7.7  NEUTROABS  --  5.3  HGB 19.4* 15.9  HCT 55.3* 45.3  MCV 93.4 94.8  PLT 215 167   Cardiac Enzymes: Recent Labs  Lab 07/12/22 1842  CKTOTAL 84   BNP (last 3 results) No results for input(s): "PROBNP" in the last 8760 hours. CBG: Recent Labs  Lab 07/12/22 2140 07/13/22 0003 07/13/22 0436  GLUCAP 124* 149* 107*   D-Dimer: No results for input(s): "DDIMER" in the last 72 hours. Hgb A1c: No results for input(s): "HGBA1C" in the last 72 hours. Lipid Profile: No results for input(s): "CHOL", "HDL", "LDLCALC", "TRIG", "CHOLHDL", "LDLDIRECT" in the last 72 hours. Thyroid function studies: No results for input(s): "TSH", "T4TOTAL", "T3FREE", "THYROIDAB" in the last 72 hours.  Invalid input(s): "FREET3" Anemia work up: No results for input(s): "VITAMINB12",  "FOLATE", "FERRITIN", "TIBC", "IRON", "RETICCTPCT" in the last 72 hours. Sepsis Labs: Recent Labs  Lab 07/12/22 1604 07/12/22 2000 07/12/22 2200 07/13/22 0159  WBC 14.0*  --   --  7.7  LATICACIDVEN  --  1.2 1.0  --    Microbiology No results found for this or any previous visit (from the past 240 hour(s)).   Medications:    enoxaparin (LOVENOX) injection  40 mg Subcutaneous Daily   insulin aspart  0-9 Units Subcutaneous Q4H   Continuous Infusions:  lactated ringers 50 mL/hr at 07/13/22 0138      LOS: 1 day   Marinda Elk  [facility]  07/13/2022, 7:39 AM

## 2022-07-13 NOTE — Progress Notes (Signed)
Brandon [name] is a 66 y.o. male patient admitted. Awake, alert - oriented  X 4 - no acute distress noted.  VSS - Blood pressure 139/77, pulse 82, temperature 98.8 F (37.1 C), temperature source Oral, resp. rate 18, height 5\' 9"  (1.753 m), weight 83.9 kg, SpO2 96 %.    IV in place, occlusive dsg intact without redness.  Orientation to room, and floor completed.  Admission INP armband ID verified with patient/family, and in place.   SR up x 2, fall assessment complete, with patient and family able to verbalize understanding of risk associated with falls, and verbalized understanding to call nsg before up out of bed.  Call light within reach, patient able to voice, and demonstrate understanding. No evidence of skin break down noted on exam.  Will cont to eval and treat per MD orders.

## 2022-07-13 NOTE — H&P (Signed)
History and Physical  Brandon [name] VVO:[id number] DOB: 01-05-1956 DOA: 07/12/2022  Referring physician: Accepted by Dr. Arville Care, TRH.  PCP: Brandon Coombe, DO  Outpatient Specialists: General surgery. Patient coming from: Home through [facility] ED  Chief Complaint: Abdominal pain, nausea and vomiting x1 day.  HPI: Brandon [name] is a 66 y.o. male with medical history significant for prior bowel obstructions x2, prior abdominal surgeries x3, hypertension, type 2 diabetes, hyperlipidemia, coronary artery disease status post PCI with stenting, OSA, who presented to [facility] ED due to nausea vomiting and severe abdominal pain x1 day.  Symptoms are similar to prior bowel obstruction.  Last bowel movement was 2 nights ago.  Denies flatulence at the time of this visit.  His nausea and vomiting are improved.  In the ED, noted to be hypotensive and tachycardic.  Mild leukocytosis with hemoconcentration.  Abdominal and pelvic CT scan showed a single mildly prominent loop of small bowel in the left mid abdomen favoring adynamic small bowel ileus or small bowel enteritis or partial small bowel obstruction.  EDP requested admission for further management.  The patient was admitted by [facility], hospitalist service.  ED Course: Tmax 98.8.  BP 129/77, pulse 94, respiration rate 18, O2 saturation 96% on room air.  Lab studies remarkable for serum glucose 192.  T. bili 1.9.  WBC 14.0.  Hemoglobin 19.4.  Review of Systems: Review of systems as noted in the HPI. All other systems reviewed and are negative.   Past Medical History:  Diagnosis Date   Anxiety    Arthritis    Basal cell carcinoma (BCC) in situ of skin    left neck   Chronic back pain    has spinal cord stimulator and wears buprenorphine patch   Colon polyps    Coronary artery disease    S/p DES to mRCA in 12/21 // S/p DES to LCx x 2 in 12/2020 // Diff dz in small to mod Dx (not a good target for PCI); OM1 100 CTO; severe dz in small OM2 and PDA >> Med Rx    Depression    Diabetes mellitus without complication (HCC)    type 2   Fibromyalgia    Gallstones    GERD (gastroesophageal reflux disease)    Hepatitis B    HLD (hyperlipidemia)    Hypertension    Osteoarthritis    Pneumonia    PONV (postoperative nausea and vomiting)    SBO (small bowel obstruction) (HCC)    Sleep apnea    does not use CPAP   Past Surgical History:  Procedure Laterality Date   CARDIAC CATHETERIZATION  01/17/2021   CATARACT EXTRACTION W/ INTRAOCULAR LENS  IMPLANT, BILATERAL     CHOLECYSTECTOMY     CHOLECYSTECTOMY, LAPAROSCOPIC     CORONARY STENT INTERVENTION N/A 08/15/2020   Procedure: CORONARY STENT INTERVENTION;  Surgeon: Corky Crafts, MD;  Location: [facility] INVASIVE CV LAB;  Service: Cardiovascular;  Laterality: N/A;   CORONARY STENT INTERVENTION N/A 01/17/2021   Procedure: CORONARY STENT INTERVENTION;  Surgeon: Kathleene Hazel, MD;  Location: [facility] INVASIVE CV LAB;  Service: Cardiovascular;  Laterality: N/A;   DRUG INDUCED ENDOSCOPY N/A 05/23/2022   Procedure: DRUG INDUCED SLEEP ENDOSCOPY;  Surgeon: Osborn Coho, MD;  Location: [facility] OR;  Service: ENT;  Laterality: N/A;   HERNIA REPAIR     INTRAVASCULAR ULTRASOUND/IVUS N/A 08/15/2020   Procedure: Intravascular Ultrasound/IVUS;  Surgeon: Corky Crafts, MD;  Location: [facility] INVASIVE CV LAB;  Service: Cardiovascular;  Laterality: N/A;  LEFT HEART CATH AND CORONARY ANGIOGRAPHY N/A 08/15/2020   Procedure: LEFT HEART CATH AND CORONARY ANGIOGRAPHY;  Surgeon: Jettie Booze, MD;  Location: [place] CV LAB;  Service: Cardiovascular;  Laterality: N/A;   LEFT HEART CATH AND CORONARY ANGIOGRAPHY N/A 01/17/2021   Procedure: LEFT HEART CATH AND CORONARY ANGIOGRAPHY;  Surgeon: Burnell Blanks, MD;  Location: [place] CV LAB;  Service: Cardiovascular;  Laterality: N/A;   LUMBAR FUSION     L3-5 with rods   MICRODISCECTOMY LUMBAR  2006   SPINAL CORD STIMULATOR INSERTION     blood clot  removed from spinal cord   SPINE SURGERY     blood clot removed from spinal cord   XI ROBOTIC ASSISTED INGUINAL HERNIA REPAIR WITH MESH     x 3 surgeries    Social History:  reports that he quit smoking about 16 years ago. His smoking use included cigarettes. He has a 30.00 pack-year smoking history. He has never used smokeless tobacco. He reports that he does not currently use alcohol. He reports that he does not currently use drugs.   Allergies  Allergen Reactions   Kiwi Extract Swelling    Mouth swelling.    Other Swelling and Other (See Comments)    EGGPLANT. Mouth swelling.   Penicillins Rash    Reaction: unknown   Ancef [Cefazolin] Rash   Latex Rash   Levaquin [Levofloxacin] Rash   Peach [Prunus Persica] Other (See Comments)    Burns mouth    Family History  Problem Relation Age of Onset   COPD Mother    Anxiety disorder Mother    Depression Mother    Colon cancer Mother    Cancer Father        mets, origin unknown   Anxiety disorder Father    Depression Father    Coronary artery disease Father    CAD Brother    Leukemia Maternal Grandmother    Multiple sclerosis Daughter    Diabetes Neg Hx       Prior to Admission medications   Medication Sig Start Date End Date Taking? Authorizing Provider  acetaminophen (TYLENOL) 500 MG tablet Take 1,000 mg by mouth See admin instructions. Take 2 tablets (1000 mg) by mouth scheduled in the morning & may take 2 tablets (1000 mg) by mouth every 6 hours if needed for pain.    [provider]  albuterol (VENTOLIN HFA) 108 (90 Base) MCG/ACT inhaler Inhale 2 puffs by mouth  into the lungs every 6 (six) hours as needed for wheezing. Patient not taking: Reported on 05/17/2022 10/29/21   Silverio Decamp, MD  ARIPiprazole (ABILIFY) 10 MG tablet Take 1 tablet (10 mg total) by mouth every morning. 05/02/22   Luetta Nutting, DO  aspirin 81 MG EC tablet Take 81 mg by mouth every morning.    [provider]   atorvastatin (LIPITOR) 80 MG tablet Take 1 tablet (80 mg total) by mouth daily. 05/20/22   Freada Bergeron, MD  b complex vitamins capsule Take 1 capsule by mouth in the morning.    [provider]  buprenorphine (BUTRANS) 15 MCG/HR Place 1 patch onto the skin once a week. Patient taking differently: Place 1 patch onto the skin every Friday. 05/02/22 07/26/22  Gillis Santa, MD  buPROPion (WELLBUTRIN XL) 300 MG 24 hr tablet Take 1 tablet (300 mg total) by mouth daily. 06/04/22   Luetta Nutting, DO  clopidogrel (PLAVIX) 75 MG tablet TAKE 1 TABLET (75 MG TOTAL) BY  MOUTH DAILY WITH BREAKFAST. 07/17/21 09/05/22  Meriam Sprague, MD  Continuous Blood Gluc Sensor (DEXCOM G6 SENSOR) MISC Use as directed. Change sensor every 10 days 01/02/22   Shamleffer, Konrad Dolores, MD  docusate sodium (COLACE) 100 MG capsule Take 200 mg by mouth in the morning.    [provider]  empagliflozin (JARDIANCE) 25 MG TABS tablet Take 1 tablet (25 mg total) by mouth daily. 11/08/21   Brandon Coombe, DO  GVOKE HYPOPEN 1-PACK 1 MG/0.2ML SOAJ Use as needed for hypoglycemia 05/13/22   Carlus Pavlov, MD  insulin lispro (HUMALOG) 100 UNIT/ML injection For use in pump, total of 60 units per day 10/19/21   Romero Belling, MD  Iron, Ferrous Sulfate, 325 (65 Fe) MG TABS Take 1 tablet (325 mg) by mouth daily. 02/01/22   Brandon Coombe, DO  isosorbide mononitrate (IMDUR) 30 MG 24 hr tablet Take 1 tablet (30 mg total) by mouth daily. 04/30/22   Meriam Sprague, MD  lidocaine (LIDODERM) 5 % Apply 1 - 3 patches on to the skin as directed. 12 hours on and 12 hours off. Patient not taking: Reported on 05/17/2022 08/10/21     magnesium oxide (MAG-OX) 400 (240 Mg) MG tablet Take 800 mg by mouth in the morning.    [provider]  metoCLOPramide (REGLAN) 10 MG tablet Take 10 mg by mouth daily as needed for nausea or vomiting (upset stomach).    [provider]  metoprolol succinate (TOPROL XL) 25 MG  24 hr tablet Take 1 tablet (25 mg total) by mouth daily. 10/24/21   Meriam Sprague, MD  Na Sulfate-K Sulfate-Mg Sulf 17.5-3.13-1.6 GM/177ML SOLN Take by mouth as directed 03/13/22   Jenel Lucks, MD  nitroGLYCERIN (NITROSTAT) 0.4 MG SL tablet Place 0.4 mg under the tongue every 5 (five) minutes x 3 doses as needed for chest pain.    [provider]  ondansetron (ZOFRAN-ODT) 4 MG disintegrating tablet Take 1 tablet (4 mg total) by mouth every 8 (eight) hours as needed for nausea or vomiting. 05/02/22   Brandon Coombe, DO  pantoprazole (PROTONIX) 40 MG tablet Take 1 tablet (40 mg total) by mouth daily. 06/04/22   Brandon Coombe, DO  Semaglutide, 2 MG/DOSE, (OZEMPIC, 2 MG/DOSE,) 8 MG/3ML SOPN Inject 2 mg into the skin once a week. Patient taking differently: Inject 2 mg into the skin every Wednesday. 03/12/22   Carlus Pavlov, MD  tamsulosin (FLOMAX) 0.4 MG CAPS capsule Take 1 capsule (0.4 mg total) by mouth daily. 05/02/22   Brandon Coombe, DO  testosterone (ANDROGEL) 50 MG/5GM (1%) GEL Place 5 g (1 packet) onto the shoulders and upper arms daily as directed. 05/21/22 08/06/22  Brandon Coombe, DO  tiZANidine (ZANAFLEX) 4 MG tablet Take 1-1&1/2 tablets (4-6 mg total) by mouth every 8 (eight) hours as needed for muscle spasms. Patient taking differently: Take 8 mg by mouth in the morning. 02/05/22   Edward Jolly, MD    Physical Exam: BP 139/77 (BP Location: Right Arm)   Pulse 82   Temp 98.8 F (37.1 C) (Oral)   Resp 18   Ht 5\' 9"  (1.753 m)   Wt 83.9 kg   SpO2 96%   BMI 27.32 kg/m   General: 65 y.o. year-old male well developed well nourished in no acute distress.  Alert and oriented x3. Cardiovascular: Regular rate and rhythm with no rubs or gallops.  No thyromegaly or JVD noted.  No lower extremity edema. 2/4 pulses in all 4 extremities. Respiratory:  Clear to auscultation with no wheezes or rales. Good inspiratory effort. Abdomen: Soft nontender nondistended with bowel sounds  present. Muskuloskeletal: No cyanosis, clubbing or edema noted bilaterally Neuro: CN II-XII intact, strength, sensation, reflexes Skin: No ulcerative lesions noted or rashes Psychiatry: Judgement and insight appear normal. Mood is appropriate for condition and setting          Labs on Admission:  Basic Metabolic Panel: Recent Labs  Lab 07/12/22 1604  NA 139  K 3.6  CL 101  CO2 27  GLUCOSE 192*  BUN 18  CREATININE 0.96  CALCIUM 9.7   Liver Function Tests: Recent Labs  Lab 07/12/22 1604  AST 26  ALT 19  ALKPHOS 82  BILITOT 1.9*  PROT 9.1*  ALBUMIN 4.9   Recent Labs  Lab 07/12/22 1604  LIPASE 37   No results for input(s): "AMMONIA" in the last 168 hours. CBC: Recent Labs  Lab 07/12/22 1604  WBC 14.0*  HGB 19.4*  HCT 55.3*  MCV 93.4  PLT 215   Cardiac Enzymes: Recent Labs  Lab 07/12/22 1842  CKTOTAL 84    BNP (last 3 results) No results for input(s): "BNP" in the last 8760 hours.  ProBNP (last 3 results) No results for input(s): "PROBNP" in the last 8760 hours.  CBG: Recent Labs  Lab 07/12/22 2140 07/13/22 0003  GLUCAP 124* 149*    Radiological Exams on Admission: CT Abdomen Pelvis W Contrast  Result Date: 07/12/2022 CLINICAL DATA:  Abdominal pain, nausea/vomiting/diarrhea x1 day, history of bowel obstruction EXAM: CT ABDOMEN AND PELVIS WITH CONTRAST TECHNIQUE: Multidetector CT imaging of the abdomen and pelvis was performed using the standard protocol following bolus administration of intravenous contrast. RADIATION DOSE REDUCTION: This exam was performed according to the departmental dose-optimization program which includes automated exposure control, adjustment of the mA and/or kV according to patient size and/or use of iterative reconstruction technique. CONTRAST:  161mL OMNIPAQUE IOHEXOL 300 MG/ML  SOLN COMPARISON:  12/30/2021 FINDINGS: Lower chest: Mild right basilar atelectasis. Hepatobiliary: Liver is within normal limits. Status post  cholecystectomy. No intrahepatic or extrahepatic duct dilatation. Pancreas: Within normal limits. Spleen: Calcified splenic granuloma, benign. Adrenals/Urinary Tract: Adrenal glands are within normal limits. 3.0 cm right upper pole renal cyst (series 2/image 21), measuring simple fluid density, benign (Bosniak I). No follow-up is recommended. Left kidney is within normal limits. No hydronephrosis. Bladder is within normal limits. Stomach/Bowel: Stomach is within normal limits. A single mildly prominent loop of small bowel is present in the left mid abdomen (series 2/image 50). Colon is fluid-filled not decompressed. Overall, this appearance favors adynamic small bowel ileus or small bowel enteritis over partial small bowel obstruction. Normal appendix (series 2/image 48). No colonic wall thickening or inflammatory changes. Vascular/Lymphatic: No evidence of abdominal aortic aneurysm. Atherosclerotic calcifications of the abdominal aorta and branch vessels. No suspicious abdominopelvic lymphadenopathy. Reproductive: Prostatomegaly, with enlargement of the central gland, suggesting BPH. Penile prosthesis with reservoir beneath the left lower anterior abdominal wall (series 2/image 63). Other: No abdominopelvic ascites. Musculoskeletal: Status post PLIF at L4-5. Thoracic spine stimulator. Degenerative changes of the visualized thoracolumbar spine. IMPRESSION: A single mildly prominent loop of small bowel in the left mid abdomen, favoring adynamic small bowel ileus or small bowel enteritis over partial small bowel obstruction. Additional ancillary findings as above. Electronically Signed   By: Julian Hy M.D.   On: 07/12/2022 21:29    EKG: I independently viewed the EKG done and my findings are as followed: None available at the time  of this visit.  Assessment/Plan Present on Admission:  SBO (small bowel obstruction) (HCC)  Ileus (HCC)  Principal Problem:   Ileus (HCC) Active Problems:   SBO (small  bowel obstruction) (HCC)  Abdominal pain secondary to partial small bowel obstruction versus ileus. History of bowel obstructions x2 History of abdominal surgeries x3 Continue supportive care IV fluid hydration, continue IV antiemetics as needed Suppository laxative as needed Optimize magnesium level, greater than 2.0 Optimize potassium level, greater than 4.0. Mobilize every shift as tolerated.  Type 2 diabetes with hyperglycemia Start insulin sliding scale every 4 hours while NPO.  Hyperlipidemia Resume home regimen once no longer NPO.  Chronic anxiety/depression Resume home regimen when no longer NPO.   DVT prophylaxis: Subcu Lovenox daily  Code Status: Full code  Family Communication: None at bedside.  Disposition Plan: Admitted to MedSurg unit  Consults called: General surgery.  Admission status: Inpatient status.   Status is: Inpatient The patient requires at least 2 midnights for further evaluation and treatment of present condition.   Kayleen Memos MD [facility] Pager [phone]  If 7PM-7AM, please contact night-coverage [email or web address] Password [email or web address]  07/13/2022, 12:04 AM

## 2022-07-13 NOTE — Evaluation (Signed)
Physical Therapy Evaluation Patient Details Name: Brandon [name] MRN: [id number] DOB: 1955-11-22 Today's Date: 07/13/2022  History of Present Illness  66 y/o male presented to ED on 07/12/22 for mild abdominal pain, NVD x 1 day. CT showed possible small bowel obstruction vs ileus. PMH: anxiety, CAD, HTN, SBO, T2DM.  Clinical Impression  Patient admitted with the above. PTA, patient lives with wife and son and reports independence. Patient continues to function at independent level but with decreased gait speed. Encouraged continued mobility throughout day to promote bowel function. No further skilled PT needs identified acutely. No PT follow up recommended at this time.        Recommendations for follow up therapy are one component of a multi-disciplinary discharge planning process, led by the attending physician.  Recommendations may be updated based on patient status, additional functional criteria and insurance authorization.  Follow Up Recommendations No PT follow up      Assistance Recommended at Discharge PRN  Patient can return home with the following       Equipment Recommendations None recommended by PT  Recommendations for Other Services       Functional Status Assessment Patient has not had a recent decline in their functional status     Precautions / Restrictions Precautions Precautions: None Restrictions Weight Bearing Restrictions: No      Mobility  Bed Mobility Overal bed mobility: Independent                  Transfers Overall transfer level: Independent Equipment used: None                    Ambulation/Gait Ambulation/Gait assistance: Independent Gait Distance (Feet): 500 Feet Assistive device: None Gait Pattern/deviations: WFL(Within Functional Limits) Gait velocity: decreased     General Gait Details: slow gait speed but seems to be baseline  Information systems manager Rankin (Stroke Patients  Only)       Balance Overall balance assessment: No apparent balance deficits (not formally assessed)                                           Pertinent Vitals/Pain Pain Assessment Pain Assessment: Faces Faces Pain Scale: Hurts a little bit Pain Location: abdomen Pain Descriptors / Indicators: Tender Pain Intervention(s): Monitored during session    Home Living Family/patient expects to be discharged to:: Private residence Living Arrangements: Spouse/significant other;Children Available Help at Discharge: Family Type of Home: Apartment Home Access: Stairs to enter Entrance Stairs-Rails: Lawyer of Steps: flight   Home Layout: One level   Additional Comments: spouse works at [employer] - nights and weekends    Prior Function Prior Level of Function : Independent/Modified Independent                     Hand Dominance        Extremity/Trunk Assessment   Upper Extremity Assessment Upper Extremity Assessment: Overall WFL for tasks assessed    Lower Extremity Assessment Lower Extremity Assessment: Generalized weakness    Cervical / Trunk Assessment Cervical / Trunk Assessment: Kyphotic (hx of lumbar fusion and cervical)  Communication   Communication: No difficulties  Cognition Arousal/Alertness: Awake/alert Behavior During Therapy: WFL for tasks assessed/performed Overall Cognitive Status: Within Functional Limits for tasks assessed  General Comments      Exercises     Assessment/Plan    PT Assessment Patient does not need any further PT services  PT Problem List         PT Treatment Interventions      PT Goals (Current goals can be found in the Care Plan section)  Acute Rehab PT Goals Patient Stated Goal: to feel better PT Goal Formulation: All assessment and education complete, DC therapy    Frequency       Co-evaluation                AM-PAC PT "6 Clicks" Mobility  Outcome Measure Help needed turning from your back to your side while in a flat bed without using bedrails?: None Help needed moving from lying on your back to sitting on the side of a flat bed without using bedrails?: None Help needed moving to and from a bed to a chair (including a wheelchair)?: None Help needed standing up from a chair using your arms (e.g., wheelchair or bedside chair)?: None Help needed to walk in hospital room?: None Help needed climbing 3-5 steps with a railing? : None 6 Click Score: 24    End of Session   Activity Tolerance: Patient tolerated treatment well Patient left: in bed;with call bell/phone within reach Nurse Communication: Mobility status PT Visit Diagnosis: Muscle weakness (generalized) (M62.81)    Time: 8756-4332 PT Time Calculation (min) (ACUTE ONLY): 13 min   Charges:   PT Evaluation $PT Eval Low Complexity: 1 Low          Brailey Buescher A. Dan Humphreys PT, DPT Acute Rehabilitation Services Office [phone]   Viviann Spare 07/13/2022, 11:24 AM

## 2022-07-14 DIAGNOSIS — K56600 Partial intestinal obstruction, unspecified as to cause: Secondary | ICD-10-CM | POA: Diagnosis not present

## 2022-07-14 DIAGNOSIS — K567 Ileus, unspecified: Secondary | ICD-10-CM | POA: Diagnosis not present

## 2022-07-14 LAB — GLUCOSE, CAPILLARY
Glucose-Capillary: 111 mg/dL — ABNORMAL HIGH (ref 70–99)
Glucose-Capillary: 125 mg/dL — ABNORMAL HIGH (ref 70–99)
Glucose-Capillary: 157 mg/dL — ABNORMAL HIGH (ref 70–99)
Glucose-Capillary: 193 mg/dL — ABNORMAL HIGH (ref 70–99)
Glucose-Capillary: 89 mg/dL (ref 70–99)

## 2022-07-14 MED ORDER — CLOPIDOGREL BISULFATE 75 MG PO TABS
75.0000 mg | ORAL_TABLET | Freq: Every day | ORAL | Status: DC
  Administered 2022-07-14 – 2022-07-15 (×2): 75 mg via ORAL
  Filled 2022-07-14 (×2): qty 1

## 2022-07-14 MED ORDER — PANTOPRAZOLE SODIUM 40 MG PO TBEC
40.0000 mg | DELAYED_RELEASE_TABLET | Freq: Every day | ORAL | Status: DC
  Administered 2022-07-14 – 2022-07-15 (×2): 40 mg via ORAL
  Filled 2022-07-14 (×2): qty 1

## 2022-07-14 MED ORDER — DEXTROSE-NACL 5-0.9 % IV SOLN: INTRAVENOUS | Status: DC

## 2022-07-14 MED ORDER — MAGNESIUM OXIDE -MG SUPPLEMENT 400 (240 MG) MG PO TABS
800.0000 mg | ORAL_TABLET | Freq: Every day | ORAL | Status: DC
  Administered 2022-07-14 – 2022-07-15 (×2): 800 mg via ORAL
  Filled 2022-07-14 (×2): qty 2

## 2022-07-14 NOTE — Progress Notes (Signed)
This patient has had two incontinence episodes of brown, loose stool tonight. Patient reported these episodes are more loose than prior to admission. Patient stated he has not had solid food since Wednesday evening, only liquids. Patient c/o mild abdominal cramping and denies nausea, vomiting, or melena.

## 2022-07-14 NOTE — Progress Notes (Signed)
  Transition of Care [facility]) Screening Note   Patient Details  Name: Brandon [name] Date of Birth: 06-19-1956   Transition of Care [facility]) CM/SW Contact:    Bess Kinds, RN Phone Number: [phone] 07/14/2022, 12:15 PM    Transition of Care Department [facility]) has reviewed patient and no TOC needs have been identified at this time. We will continue to monitor patient advancement through interdisciplinary progression rounds. If new patient transition needs arise, please place a TOC consult.

## 2022-07-14 NOTE — Progress Notes (Signed)
[facility] PROGRESS NOTE    Progress Note  Brandon [name]  YHC:[id number] DOB: 1956-02-08 DOA: 07/12/2022 PCP: Everrett Coombe, DO     Brief Narrative:   Brandon [name] is an 66 y.o. male past medical history significant for prior small bowel obstruction, x3 abdominal surgeries, diabetes mellitus type 2 history of PCI with stenting comes into the ED for abdominal pain nausea and vomiting last bowel movement was 2 days prior to admission, in the ED mild leukocytosis abdominal and pelvic CT showed prominent dilated loops question small bowel otherwise stable.   Assessment/Plan:   SBO (small bowel obstruction) (HCC): Multiple bowel movements. Abdominal x-ray showed contrast throughout the colon. Try to keep potassium greater than 4, magnesium greater than 2. Resume home dose of narcotics. Continue to ambulate as much as possible. Advance to full liquid diet we will watch him for an additional 24 hours and advance diet as tolerated.  Primary hypertension Seems to be stable continue to monitor allow medications once he is able to take orals.  Type 2 diabetes mellitus with other specified complication [facility]) He is currently managing his insulin.  Has an insulin pump.  Hyperlipidemia associated with type 2 diabetes mellitus (HCC) Resume this.  Chronic anxiety/depression: Can resume home regimen once no longer is n.p.o.   DVT prophylaxis: lovenox Family Communication:none Status is: Inpatient Remains inpatient appropriate because: Small bowel obstruction    Code Status:     Code Status Orders  (From admission, onward)           Start     Ordered   07/13/22 0342  Full code  Continuous        07/13/22 0341           Code Status History     Date Active Date Inactive Code Status Order ID Comments User Context   12/30/2021 0947 01/02/2022 1919 Full Code [id number]  Jonah Blue, MD Inpatient   12/30/2021 0530 12/30/2021 0947 Full Code [id number]  Angie Fava, DO Inpatient   11/01/2021 0228 11/02/2021 2017 Full Code [id number]  Anselm Jungling, DO ED   01/17/2021 1517 01/18/2021 1829 Full Code [id number]  Kathleene Hazel, MD Inpatient   08/12/2020 1710 08/16/2020 1937 Full Code [id number]  Emeline General, MD Inpatient   04/14/2020 2001 04/17/2020 1710 Full Code [id number]  Charlsie Quest, MD Inpatient   04/07/2020 0730 04/12/2020 1710 Full Code [id number]  Marinda Elk, MD Inpatient         IV Access:   Peripheral IV   Procedures and diagnostic studies:   DG Abd Portable 1V-Small Bowel Obstruction Protocol-initial, 8 hr delay  Result Date: 07/13/2022 CLINICAL DATA:  Delayed image.  Small bowel obstruction. EXAM: PORTABLE ABDOMEN - 1 VIEW COMPARISON:  CT of the abdomen and pelvis 07/12/2022 FINDINGS: Oral contrast is distributed throughout the colon to the level of the rectum. Faint contrast is seen in the distal small bowel. No obstruction is present. Spinal stimulator noted. Pedicle screw and rod fixation is present at L4-5. IMPRESSION: Oral contrast is distributed throughout the colon to the level of the rectum. No evidence for obstruction. Electronically Signed   By: Marin Roberts M.D.   On: 07/13/2022 17:36   CT Abdomen Pelvis W Contrast  Result Date: 07/12/2022 CLINICAL DATA:  Abdominal pain, nausea/vomiting/diarrhea x1 day, history of bowel obstruction EXAM: CT ABDOMEN AND PELVIS WITH CONTRAST TECHNIQUE: Multidetector CT imaging of the abdomen and pelvis was performed using the standard protocol following bolus  administration of intravenous contrast. RADIATION DOSE REDUCTION: This exam was performed according to the departmental dose-optimization program which includes automated exposure control, adjustment of the mA and/or kV according to patient size and/or use of iterative reconstruction technique. CONTRAST:  OMNIPAQUE IOHEXOL 300 MG/ML  SOLN COMPARISON:  12/30/2021 FINDINGS: Lower chest: Mild right basilar atelectasis.  Hepatobiliary: Liver is within normal limits. Status post cholecystectomy. No intrahepatic or extrahepatic duct dilatation. Pancreas: Within normal limits. Spleen: Calcified splenic granuloma, benign. Adrenals/Urinary Tract: Adrenal glands are within normal limits. 3.0 cm right upper pole renal cyst (series 2/image 21), measuring simple fluid density, benign (Bosniak I). No follow-up is recommended. Left kidney is within normal limits. No hydronephrosis. Bladder is within normal limits. Stomach/Bowel: Stomach is within normal limits. A single mildly prominent loop of small bowel is present in the left mid abdomen (series 2/image 50). Colon is fluid-filled not decompressed. Overall, this appearance favors adynamic small bowel ileus or small bowel enteritis over partial small bowel obstruction. Normal appendix (series 2/image 48). No colonic wall thickening or inflammatory changes. Vascular/Lymphatic: No evidence of abdominal aortic aneurysm. Atherosclerotic calcifications of the abdominal aorta and branch vessels. No suspicious abdominopelvic lymphadenopathy. Reproductive: Prostatomegaly, with enlargement of the central gland, suggesting BPH. Penile prosthesis with reservoir beneath the left lower anterior abdominal wall (series 2/image 63). Other: No abdominopelvic ascites. Musculoskeletal: Status post PLIF at L4-5. Thoracic spine stimulator. Degenerative changes of the visualized thoracolumbar spine. IMPRESSION: A single mildly prominent loop of small bowel in the left mid abdomen, favoring adynamic small bowel ileus or small bowel enteritis over partial small bowel obstruction. Additional ancillary findings as above. Electronically Signed   By: Charline Bills M.D.   On: 07/12/2022 21:29     Medical Consultants:   None.   Subjective:    Brandon [name] feels much better this morning hungry has had 3 bowel movements that are watery.  Objective:    Vitals:   07/13/22 1153 07/13/22 1831 07/13/22  1951 07/14/22 0403  BP: 120/75 118/70 124/65 138/67  Pulse: 73 83 72 71  Resp: 17 17    Temp: 97.6 F (36.4 C) 97.9 F (36.6 C) 98.2 F (36.8 C) (!) 97.3 F (36.3 C)  TempSrc: Oral Oral Oral Oral  SpO2: 97% 96% 95% 97%  Weight:      Height:       SpO2: 97 %   Intake/Output Summary (Last 24 hours) at 07/14/2022 0836 Last data filed at 07/14/2022 0314 Gross per 24 hour  Intake 1425.79 ml  Output --  Net 1425.79 ml    Filed Weights   07/12/22 1532  Weight: 83.9 kg    Exam: General exam: In no acute distress. Respiratory system: Good air movement and clear to auscultation. Cardiovascular system: S1 & S2 heard, RRR. No JVD. Gastrointestinal system: Abdomen is nondistended, soft and nontender.  Extremities: No pedal edema. Skin: No rashes, lesions or ulcers Psychiatry: Judgement and insight appear normal. Mood & affect appropriate.   Data Reviewed:    Labs: Basic Metabolic Panel: Recent Labs  Lab 07/12/22 1604 07/13/22 0159  NA 139 142  K 3.6 3.7  CL 101 104  CO2 27 27  GLUCOSE 192* 129*  BUN 18 13  CREATININE 0.96 0.85  CALCIUM 9.7 8.9  MG  --  1.9  PHOS  --  3.2    GFR Estimated Creatinine Clearance: 85.5 mL/min (by C-G formula based on SCr of 0.85 mg/dL). Liver Function Tests: Recent Labs  Lab 07/12/22 1604 07/13/22  0159  AST 26 18  ALT 19 14  ALKPHOS 82 56  BILITOT 1.9* 1.0  PROT 9.1* 6.1*  ALBUMIN 4.9 3.6    Recent Labs  Lab 07/12/22 1604  LIPASE 37    No results for input(s): "AMMONIA" in the last 168 hours. Coagulation profile No results for input(s): "INR", "PROTIME" in the last 168 hours. COVID-19 Labs  No results for input(s): "DDIMER", "FERRITIN", "LDH", "CRP" in the last 72 hours.  Lab Results  Component Value Date   SARSCOV2NAA NEGATIVE 10/31/2021   SARSCOV2NAA NEGATIVE 10/22/2021   SARSCOV2NAA NEGATIVE 08/11/2020   SARSCOV2NAA NEGATIVE 04/14/2020    CBC: Recent Labs  Lab 07/12/22 1604 07/13/22 0159  WBC  14.0* 7.7  NEUTROABS  --  5.3  HGB 19.4* 15.9  HCT 55.3* 45.3  MCV 93.4 94.8  PLT 215 167    Cardiac Enzymes: Recent Labs  Lab 07/12/22 1842  CKTOTAL 84    BNP (last 3 results) No results for input(s): "PROBNP" in the last 8760 hours. CBG: Recent Labs  Lab 07/13/22 1156 07/13/22 1823 07/13/22 1952 07/14/22 0005 07/14/22 0405  GLUCAP 120* 86 87 89 111*    D-Dimer: No results for input(s): "DDIMER" in the last 72 hours. Hgb A1c: No results for input(s): "HGBA1C" in the last 72 hours. Lipid Profile: No results for input(s): "CHOL", "HDL", "LDLCALC", "TRIG", "CHOLHDL", "LDLDIRECT" in the last 72 hours. Thyroid function studies: No results for input(s): "TSH", "T4TOTAL", "T3FREE", "THYROIDAB" in the last 72 hours.  Invalid input(s): "FREET3" Anemia work up: No results for input(s): "VITAMINB12", "FOLATE", "FERRITIN", "TIBC", "IRON", "RETICCTPCT" in the last 72 hours. Sepsis Labs: Recent Labs  Lab 07/12/22 1604 07/12/22 2000 07/12/22 2200 07/13/22 0159  WBC 14.0*  --   --  7.7  LATICACIDVEN  --  1.2 1.0  --     Microbiology No results found for this or any previous visit (from the past 240 hour(s)).   Medications:    acetaminophen  1,000 mg Oral Daily   ARIPiprazole  10 mg Oral q morning   atorvastatin  80 mg Oral Daily   buprenorphine  2 patch Transdermal Weekly   buPROPion  300 mg Oral Daily   enoxaparin (LOVENOX) injection  40 mg Subcutaneous Daily   metoprolol succinate  12.5 mg Oral Daily   tamsulosin  0.4 mg Oral Daily   Continuous Infusions:  dextrose 5 % and 0.9% NaCl 75 mL/hr at 07/14/22 0054      LOS: 2 days   Marinda Elk  [facility]  07/14/2022, 8:36 AM

## 2022-07-14 NOTE — Progress Notes (Signed)
Mobility Specialist Progress Note:   07/14/22 1059  Mobility  Activity Ambulated with assistance in hallway  Level of Assistance Independent  Assistive Device None  Distance Ambulated (ft) 650 ft  Activity Response Tolerated well  Mobility Referral Yes  $Mobility charge 1 Mobility   Pt received EOB willing to participate in mobility. Complaints of stomach cramps. Left EOB with call bell in reach and all needs met.   Gareth Eagle Zena Vitelli Mobility Specialist Please contact via [employer] or  Rehab Office at [phone]

## 2022-07-15 DIAGNOSIS — E1169 Type 2 diabetes mellitus with other specified complication: Secondary | ICD-10-CM | POA: Diagnosis not present

## 2022-07-15 DIAGNOSIS — I10 Essential (primary) hypertension: Secondary | ICD-10-CM

## 2022-07-15 DIAGNOSIS — Z794 Long term (current) use of insulin: Secondary | ICD-10-CM

## 2022-07-15 DIAGNOSIS — K56600 Partial intestinal obstruction, unspecified as to cause: Secondary | ICD-10-CM | POA: Diagnosis not present

## 2022-07-15 DIAGNOSIS — K567 Ileus, unspecified: Secondary | ICD-10-CM | POA: Diagnosis not present

## 2022-07-15 LAB — GLUCOSE, CAPILLARY: Glucose-Capillary: 96 mg/dL (ref 70–99)

## 2022-07-15 NOTE — Discharge Summary (Signed)
Physician Discharge Summary  Brandon [name] IWO:[id number] DOB: 1956/07/30 DOA: 07/12/2022  PCP: Everrett Coombe, DO  Admit date: 07/12/2022 Discharge date: 07/15/2022  Admitted From: Home Disposition:  Home  Recommendations for Outpatient Follow-up:  Follow up with PCP in 1-2 weeks Please obtain BMP/CBC in one week   Home Health:No Equipment/Devices:None  Discharge Condition:Stable CODE STATUS:Full Diet recommendation: Heart Healthy  Brief/Interim Summary: 66 y.o. male past medical history significant for prior small bowel obstruction, x3 abdominal surgeries, diabetes mellitus type 2 history of PCI with stenting comes into the ED for abdominal pain nausea and vomiting last bowel movement was 2 days prior to admission, in the ED mild leukocytosis abdominal and pelvic CT showed prominent dilated loops question small bowel otherwise stable.   Discharge Diagnoses:  Principal Problem:   Ileus (HCC) Active Problems:   Primary hypertension   Type 2 diabetes mellitus with other specified complication (HCC)   Hyperlipidemia associated with type 2 diabetes mellitus (HCC)   SBO (small bowel obstruction) (HCC)  Small bowel obstruction: Patient was treated conservatively with IV fluids nothing by mouth NG tube was not placed as he was not vomiting his potassium was kept greater than 4 magnesium greater than 2. He started having bowel movements and tolerating his diet he was advanced to a regular diet which she tolerated well and having regular bowel movements he will resume his home medications as an outpatient.  Essential hypertension: Antihypertensive medications were held on admission he resume them as an outpatient  Diabetes mellitus type 2: He has an insulin pump which she has been managing his insulin and is well controlled.  Hyperlipidemia: Resume statins.  Chronic anxiety/depression: No changes made to his medication.   Discharge Instructions  Discharge Instructions      Diet - low sodium heart healthy   Complete by: As directed    Increase activity slowly   Complete by: As directed       Allergies as of 07/15/2022       Reactions   Kiwi Extract Swelling   Mouth swelling.   Other Swelling, Other (See Comments)   EGGPLANT. Mouth swelling.   Penicillins Rash   Reaction: unknown   Ancef [cefazolin] Rash   Latex Rash   Levaquin [levofloxacin] Rash   Peach [prunus Persica] Other (See Comments)   Burns mouth        Medication List     TAKE these medications    acetaminophen 500 MG tablet Commonly known as: TYLENOL Take 1,000 mg by mouth See admin instructions. Take 2 tablets (1000 mg) by mouth scheduled in the morning & may take 2 tablets (1000 mg) by mouth every 6 hours if needed for pain.   albuterol 108 (90 Base) MCG/ACT inhaler Commonly known as: VENTOLIN HFA Inhale 2 puffs by mouth  into the lungs every 6 (six) hours as needed for wheezing.   ARIPiprazole 10 MG tablet Commonly known as: ABILIFY Take 1 tablet (10 mg total) by mouth every morning.   aspirin EC 81 MG tablet Take 81 mg by mouth every morning.   atorvastatin 80 MG tablet Commonly known as: LIPITOR Take 1 tablet (80 mg total) by mouth daily.   b complex vitamins capsule Take 1 capsule by mouth in the morning.   buprenorphine 15 MCG/HR Commonly known as: Butrans Place 1 patch onto the skin once a week. What changed: when to take this   buPROPion 300 MG 24 hr tablet Commonly known as: WELLBUTRIN XL Take 1 tablet (300 mg  total) by mouth daily.   clopidogrel 75 MG tablet Commonly known as: PLAVIX TAKE 1 TABLET (75 MG TOTAL) BY MOUTH DAILY WITH BREAKFAST. What changed: how much to take   Dexcom G6 Sensor Misc Use as directed. Change sensor every 10 days   docusate sodium 100 MG capsule Commonly known as: COLACE Take 200 mg by mouth in the morning.   FeroSul 325 (65 FE) MG tablet Generic drug: ferrous sulfate Take 1 tablet (325 mg) by mouth daily.    Gvoke HypoPen 1-Pack 1 MG/0.2ML Soaj Generic drug: Glucagon Use as needed for hypoglycemia What changed:  how much to take when to take this   HumaLOG 100 UNIT/ML injection Generic drug: insulin lispro For use in pump, total of 60 units per day What changed:  how much to take how to take this when to take this   isosorbide mononitrate 30 MG 24 hr tablet Commonly known as: IMDUR Take 1 tablet (30 mg total) by mouth daily.   Jardiance 25 MG Tabs tablet Generic drug: empagliflozin Take 1 tablet (25 mg total) by mouth daily.   lidocaine 5 % Commonly known as: LIDODERM Apply 1 - 3 patches on to the skin as directed. 12 hours on and 12 hours off.   magnesium oxide 400 (240 Mg) MG tablet Commonly known as: MAG-OX Take 800 mg by mouth in the morning.   metoCLOPramide 10 MG tablet Commonly known as: REGLAN Take 10 mg by mouth daily as needed for nausea or vomiting (upset stomach).   metoprolol succinate 25 MG 24 hr tablet Commonly known as: Toprol XL Take 1 tablet (25 mg total) by mouth daily.   Na Sulfate-K Sulfate-Mg Sulf 17.5-3.13-1.6 GM/177ML Soln Take by mouth as directed   nitroGLYCERIN 0.4 MG SL tablet Commonly known as: NITROSTAT Place 0.4 mg under the tongue every 5 (five) minutes x 3 doses as needed for chest pain.   ondansetron 4 MG disintegrating tablet Commonly known as: ZOFRAN-ODT Take 1 tablet (4 mg total) by mouth every 8 (eight) hours as needed for nausea or vomiting.   Ozempic (2 MG/DOSE) 8 MG/3ML Sopn Generic drug: Semaglutide (2 MG/DOSE) Inject 2 mg into the skin once a week. What changed: when to take this   pantoprazole 40 MG tablet Commonly known as: PROTONIX Take 1 tablet (40 mg total) by mouth daily.   tamsulosin 0.4 MG Caps capsule Commonly known as: FLOMAX Take 1 capsule (0.4 mg total) by mouth daily.   testosterone 50 MG/5GM (1%) Gel Commonly known as: ANDROGEL Place 5 g (1 packet) onto the shoulders and upper arms daily as  directed.   tiZANidine 4 MG tablet Commonly known as: ZANAFLEX Take 1-1&1/2 tablets (4-6 mg total) by mouth every 8 (eight) hours as needed for muscle spasms. What changed: how much to take        Allergies  Allergen Reactions   Kiwi Extract Swelling    Mouth swelling.    Other Swelling and Other (See Comments)    EGGPLANT. Mouth swelling.   Penicillins Rash    Reaction: unknown   Ancef [Cefazolin] Rash   Latex Rash   Levaquin [Levofloxacin] Rash   Peach [Prunus Persica] Other (See Comments)    Burns mouth    Consultations: None   Procedures/Studies: DG Abd Portable 1V-Small Bowel Obstruction Protocol-initial, 8 hr delay  Result Date: 07/13/2022 CLINICAL DATA:  Delayed image.  Small bowel obstruction. EXAM: PORTABLE ABDOMEN - 1 VIEW COMPARISON:  CT of the abdomen and pelvis 07/12/2022 FINDINGS:  Oral contrast is distributed throughout the colon to the level of the rectum. Faint contrast is seen in the distal small bowel. No obstruction is present. Spinal stimulator noted. Pedicle screw and rod fixation is present at L4-5. IMPRESSION: Oral contrast is distributed throughout the colon to the level of the rectum. No evidence for obstruction. Electronically Signed   By: Marin Robertshristopher  Mattern M.D.   On: 07/13/2022 17:36   CT Abdomen Pelvis W Contrast  Result Date: 07/12/2022 CLINICAL DATA:  Abdominal pain, nausea/vomiting/diarrhea x1 day, history of bowel obstruction EXAM: CT ABDOMEN AND PELVIS WITH CONTRAST TECHNIQUE: Multidetector CT imaging of the abdomen and pelvis was performed using the standard protocol following bolus administration of intravenous contrast. RADIATION DOSE REDUCTION: This exam was performed according to the departmental dose-optimization program which includes automated exposure control, adjustment of the mA and/or kV according to patient size and/or use of iterative reconstruction technique. CONTRAST:  100mL OMNIPAQUE IOHEXOL 300 MG/ML  SOLN COMPARISON:   12/30/2021 FINDINGS: Lower chest: Mild right basilar atelectasis. Hepatobiliary: Liver is within normal limits. Status post cholecystectomy. No intrahepatic or extrahepatic duct dilatation. Pancreas: Within normal limits. Spleen: Calcified splenic granuloma, benign. Adrenals/Urinary Tract: Adrenal glands are within normal limits. 3.0 cm right upper pole renal cyst (series 2/image 21), measuring simple fluid density, benign (Bosniak I). No follow-up is recommended. Left kidney is within normal limits. No hydronephrosis. Bladder is within normal limits. Stomach/Bowel: Stomach is within normal limits. A single mildly prominent loop of small bowel is present in the left mid abdomen (series 2/image 50). Colon is fluid-filled not decompressed. Overall, this appearance favors adynamic small bowel ileus or small bowel enteritis over partial small bowel obstruction. Normal appendix (series 2/image 48). No colonic wall thickening or inflammatory changes. Vascular/Lymphatic: No evidence of abdominal aortic aneurysm. Atherosclerotic calcifications of the abdominal aorta and branch vessels. No suspicious abdominopelvic lymphadenopathy. Reproductive: Prostatomegaly, with enlargement of the central gland, suggesting BPH. Penile prosthesis with reservoir beneath the left lower anterior abdominal wall (series 2/image 63). Other: No abdominopelvic ascites. Musculoskeletal: Status post PLIF at L4-5. Thoracic spine stimulator. Degenerative changes of the visualized thoracolumbar spine. IMPRESSION: A single mildly prominent loop of small bowel in the left mid abdomen, favoring adynamic small bowel ileus or small bowel enteritis over partial small bowel obstruction. Additional ancillary findings as above. Electronically Signed   By: Charline BillsSriyesh  Krishnan M.D.   On: 07/12/2022 21:29   (Echo, Carotid, EGD, Colonoscopy, ERCP)    Subjective: No complaints  Discharge Exam: Vitals:   07/14/22 2040 07/15/22 0358  BP: (!) 140/69 137/73   Pulse: 63 62  Resp: 17   Temp: 98 F (36.7 C) (!) 97.5 F (36.4 C)  SpO2: 97% 99%   Vitals:   07/14/22 0801 07/14/22 1448 07/14/22 2040 07/15/22 0358  BP:  (!) 118/56 (!) 140/69 137/73  Pulse:  73 63 62  Resp:  16 17   Temp:  98.2 F (36.8 C) 98 F (36.7 C) (!) 97.5 F (36.4 C)  TempSrc:  Oral Oral Oral  SpO2: 96% 92% 97% 99%  Weight:      Height:        General: Pt is alert, awake, not in acute distress Cardiovascular: RRR, S1/S2 +, no rubs, no gallops Respiratory: CTA bilaterally, no wheezing, no rhonchi Abdominal: Soft, NT, ND, bowel sounds + Extremities: no edema, no cyanosis    The results of significant diagnostics from this hospitalization (including imaging, microbiology, ancillary and laboratory) are listed below for reference.  Microbiology: No results found for this or any previous visit (from the past 240 hour(s)).   Labs: BNP (last 3 results) No results for input(s): "BNP" in the last 8760 hours. Basic Metabolic Panel: Recent Labs  Lab 07/12/22 1604 07/13/22 0159  NA 139 142  K 3.6 3.7  CL 101 104  CO2 27 27  GLUCOSE 192* 129*  BUN 18 13  CREATININE 0.96 0.85  CALCIUM 9.7 8.9  MG  --  1.9  PHOS  --  3.2   Liver Function Tests: Recent Labs  Lab 07/12/22 1604 07/13/22 0159  AST 26 18  ALT 19 14  ALKPHOS 82 56  BILITOT 1.9* 1.0  PROT 9.1* 6.1*  ALBUMIN 4.9 3.6   Recent Labs  Lab 07/12/22 1604  LIPASE 37   No results for input(s): "AMMONIA" in the last 168 hours. CBC: Recent Labs  Lab 07/12/22 1604 07/13/22 0159  WBC 14.0* 7.7  NEUTROABS  --  5.3  HGB 19.4* 15.9  HCT 55.3* 45.3  MCV 93.4 94.8  PLT 215 167   Cardiac Enzymes: Recent Labs  Lab 07/12/22 1842  CKTOTAL 84   BNP: Invalid input(s): "POCBNP" CBG: Recent Labs  Lab 07/14/22 0405 07/14/22 1211 07/14/22 2044 07/14/22 2349 07/15/22 0353  GLUCAP 111* 193* 157* 125* 96   D-Dimer No results for input(s): "DDIMER" in the last 72 hours. Hgb A1c No  results for input(s): "HGBA1C" in the last 72 hours. Lipid Profile No results for input(s): "CHOL", "HDL", "LDLCALC", "TRIG", "CHOLHDL", "LDLDIRECT" in the last 72 hours. Thyroid function studies No results for input(s): "TSH", "T4TOTAL", "T3FREE", "THYROIDAB" in the last 72 hours.  Invalid input(s): "FREET3" Anemia work up No results for input(s): "VITAMINB12", "FOLATE", "FERRITIN", "TIBC", "IRON", "RETICCTPCT" in the last 72 hours. Urinalysis    Component Value Date/Time   COLORURINE YELLOW 07/12/2022 1605   APPEARANCEUR HAZY (A) 07/12/2022 1605   LABSPEC >=1.030 07/12/2022 1605   PHURINE 5.5 07/12/2022 1605   GLUCOSEU >=500 (A) 07/12/2022 1605   HGBUR TRACE (A) 07/12/2022 1605   BILIRUBINUR MODERATE (A) 07/12/2022 1605   BILIRUBINUR negative 06/22/2020 1150   KETONESUR 80 (A) 07/12/2022 1605   PROTEINUR >=300 (A) 07/12/2022 1605   UROBILINOGEN 0.2 06/22/2020 1150   NITRITE NEGATIVE 07/12/2022 1605   LEUKOCYTESUR NEGATIVE 07/12/2022 1605   Sepsis Labs Recent Labs  Lab 07/12/22 1604 07/13/22 0159  WBC 14.0* 7.7   Microbiology No results found for this or any previous visit (from the past 240 hour(s)).    SIGNED:   Marinda Elk, MD  [facility] 07/15/2022, 7:34 AM Pager   If 7PM-7AM, please contact night-coverage [email or web address] Password [email or web address]

## 2022-07-16 ENCOUNTER — Telehealth: Payer: Self-pay

## 2022-07-16 ENCOUNTER — Other Ambulatory Visit: Payer: Self-pay | Admitting: Otolaryngology

## 2022-07-16 NOTE — Telephone Encounter (Signed)
Pt lvm requesting Dr. Ashley Royalty write him out of work until Saturday, 07/20/22 due to recent hospital discharge.   Dr. Ashley Royalty agrees to talk to him about this at his hospital follow-up.   Front Desk: Please contact patient to schedule Hospital follow-up with Dr. Ashley Royalty.

## 2022-07-17 NOTE — Telephone Encounter (Signed)
Patient scheduled for 07/25/2022 at 1:20. Tvt

## 2022-07-23 ENCOUNTER — Encounter: Payer: Self-pay | Admitting: Student in an Organized Health Care Education/Training Program

## 2022-07-23 ENCOUNTER — Ambulatory Visit
Payer: No Typology Code available for payment source | Attending: Student in an Organized Health Care Education/Training Program | Admitting: Student in an Organized Health Care Education/Training Program

## 2022-07-23 ENCOUNTER — Other Ambulatory Visit (HOSPITAL_BASED_OUTPATIENT_CLINIC_OR_DEPARTMENT_OTHER): Payer: Self-pay

## 2022-07-23 VITALS — BP 151/74 | HR 76 | Temp 97.4°F | Ht 68.0 in | Wt 185.0 lb

## 2022-07-23 DIAGNOSIS — M961 Postlaminectomy syndrome, not elsewhere classified: Secondary | ICD-10-CM | POA: Diagnosis present

## 2022-07-23 DIAGNOSIS — M4802 Spinal stenosis, cervical region: Secondary | ICD-10-CM | POA: Diagnosis present

## 2022-07-23 DIAGNOSIS — G894 Chronic pain syndrome: Secondary | ICD-10-CM | POA: Diagnosis present

## 2022-07-23 SURGERY — CANCELLED PROCEDURE
Anesthesia: General

## 2022-07-23 MED ORDER — BUPRENORPHINE 15 MCG/HR TD PTWK
1.0000 | MEDICATED_PATCH | TRANSDERMAL | 2 refills | Status: DC
  Filled 2022-07-23: qty 4, 28d supply, fill #0
  Filled 2022-08-15: qty 4, 28d supply, fill #1
  Filled 2022-09-13: qty 4, 28d supply, fill #2

## 2022-07-23 NOTE — Progress Notes (Signed)
Maintained throughout the outpatient stay will include: orient to surroundings, keep bed in low position, maintain call bell within reach at all times, provide assistance with transfer out of bed and ambulation.

## 2022-07-23 NOTE — Progress Notes (Signed)
PROVIDER NOTE: Information contained herein reflects review and annotations entered in association with encounter. Interpretation of such information and data should be left to medically-trained personnel. Information provided to patient can be located elsewhere in the medical record under "Patient Instructions". Document created using STT-dictation technology, any transcriptional errors that may result from process are unintentional.    Patient: Brandon [name]  Service Category: E/M  Provider: Gillis Santa, MD  DOB: 1956-06-26  DOS: 07/23/2022  Referring Provider: Luetta Nutting, DO  MRN: [id number]  Specialty: Interventional Pain Management  PCP: Luetta Nutting, DO  Type: Established Patient  Setting: Ambulatory outpatient    Location: Office  Delivery: Face-to-face     HPI  [name], a 66 y.o. 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 [name] primary complain today is Back Pain (Mid lower) Last encounter: My last encounter with him was on 05/11/2022. Pertinent problems: Brandon [name] has Type 2 diabetes mellitus with other specified complication ([place]); Lumbar facet arthropathy; Coronary artery disease; Acute pain of left shoulder; Sacroiliac joint disease; Chronic pain syndrome; History of lumbar fusion (L4/5); Failed back surgical syndrome; and Pain management contract signed on their pertinent problem list. Pain Assessment: Severity of Chronic pain is reported as a 2 /10. Location: Back Lower, Mid/denies. Onset: More than a month ago. Quality: Aching, Constant. Timing: Constant. Modifying factor(s): meds. Vitals:  height is _0  (1.727 m) and weight is 185 lb (83.9 kg). His temporal temperature is 97.4 F (36.3 C) (abnormal). His blood pressure is 151/74 (abnormal) and his pulse is 76. His oxygen saturation is 99%.   Reason for encounter: medication management.   Patient was hospitalized for a small bowel obstruction which has since resolved with  conservative management.  He is passing gas and having normal bowel movement Presents today for medication refill of Butrans.  He states that it is helpful in managing his pain.  He has an upcoming procedure for a hypoglossal stimulator with ENT for obstructive sleep apnea management. He states that he wants to hold off on a cervical MRI at this time which was ordered at his last visit  Pharmacotherapy Assessment  Analgesic: Butrans 15mg/hr   Monitoring: [place] PMP: PDMP reviewed during this encounter.       Pharmacotherapy: No side-effects or adverse reactions reported. Compliance: No problems identified. Effectiveness: Clinically acceptable.  HArlice Colt RN  07/23/2022 10:17 AM  Sign when Signing Visit Maintained throughout the outpatient stay will include: orient to surroundings, keep bed in low position, maintain call bell within reach at all times, provide assistance with transfer out of bed and ambulation.     No results found for: "CBDTHCR" No results found for: "D8THCCBX" No results found for: "D9THCCBX"  UDS:  Summary  Date Value Ref Range Status  10/02/2021 Note  Final    Comment:    ==================================================================== Compliance Drug Analysis, Ur ==================================================================== Test                             Result       Flag       Units  Drug Present and Declared for Prescription Verification   Buprenorphine                  8            EXPECTED   ng/mg creat   Norbuprenorphine  6            EXPECTED   ng/mg creat    Source of buprenorphine is a scheduled prescription medication.    Norbuprenorphine is an expected metabolite of buprenorphine.    Tizanidine                     PRESENT      EXPECTED   Bupropion                      PRESENT      EXPECTED   Hydroxybupropion               PRESENT      EXPECTED    Hydroxybupropion is an expected metabolite of bupropion.     Aripiprazole                   PRESENT      EXPECTED   Metoprolol                     PRESENT      EXPECTED  Drug Present not Declared for Prescription Verification   Acetaminophen                  PRESENT      UNEXPECTED  Drug Absent but Declared for Prescription Verification   Alpha-hydroxytriazolam         Not Detected UNEXPECTED ng/mg creat   Tramadol                       Not Detected UNEXPECTED ng/mg creat   Trazodone                      Not Detected UNEXPECTED   Salicylate                     Not Detected UNEXPECTED    Aspirin, as indicated in the declared medication list, is not always    detected even when used as directed.    Diphenhydramine                Not Detected UNEXPECTED   Lidocaine                      Not Detected UNEXPECTED    Lidocaine, as indicated in the declared medication list, is not    always detected even when used as directed.  ==================================================================== Test                      Result    Flag   Units      Ref Range   Creatinine              99               mg/dL      >=20 ==================================================================== Declared Medications:  The flagging and interpretation on this report are based on the  following declared medications.  Unexpected results may arise from  inaccuracies in the declared medications.   **Note: The testing scope of this panel includes these medications:   Aripiprazole (Abilify)  Bupropion (Wellbutrin)  Diphenhydramine  Metoprolol (Toprol)  Tramadol (Ultram)  Trazodone (Desyrel)  Triazolam (Halcion)   **Note: The testing scope of this panel does not include small to  moderate amounts of these reported medications:   Aspirin  Buprenorphine Patch (BuTrans)  Lidocaine  Tizanidine (Zanaflex)  Topical Lidocaine (Lidoderm)   **Note: The testing scope of this panel does not include the  following reported medications:   Amlodipine (Norvasc)   Atorvastatin (Lipitor)  Clopidogrel (Plavix)  Empagliflozin (Jardiance)  Insulin (Humalog)  Isosorbide (Imdur)  Losartan (Cozaar)  Metoclopramide (Reglan)  Nitroglycerin (Nitrostat)  Nystatin (Mycostatin)  Ondansetron (Zofran)  Pantoprazole (Protonix)  Prednisolone  Semaglutide (Ozempic)  Tamsulosin (Flomax)  Testosterone ==================================================================== For clinical consultation, please call [phone]. ====================================================================       ROS  Constitutional: Denies any fever or chills Gastrointestinal: No reported hemesis, hematochezia, vomiting, or acute GI distress Musculoskeletal: Denies any acute onset joint swelling, redness, loss of ROM, or weakness Neurological: No reported episodes of acute onset apraxia, aphasia, dysarthria, agnosia, amnesia, paralysis, loss of coordination, or loss of consciousness  Medication Review  ARIPiprazole, Dexcom G6 Sensor, Glucagon, Na Sulfate-K Sulfate-Mg Sulf, Semaglutide (2 MG/DOSE), acetaminophen, aspirin EC, atorvastatin, b complex vitamins, buPROPion, buprenorphine, clopidogrel, docusate sodium, empagliflozin, ferrous sulfate, insulin lispro, isosorbide mononitrate, lidocaine, magnesium oxide, metoCLOPramide, metoprolol succinate, nitroGLYCERIN, ondansetron, pantoprazole, tamsulosin, testosterone, and tiZANidine  History Review  Allergy: Brandon [name] is allergic to kiwi extract, other, penicillins, ancef [cefazolin], latex, levaquin [levofloxacin], and peach [prunus persica]. Drug: Brandon [name]  reports that he does not currently use drugs. Alcohol:  reports that he does not currently use alcohol. Tobacco:  reports that he quit smoking about 16 years ago. His smoking use included cigarettes. He has a 30.00 pack-year smoking history. He has never used smokeless tobacco. Social: Brandon [name]  reports that he quit smoking about 16 years ago. His smoking use included  cigarettes. He has a 30.00 pack-year smoking history. He has never used smokeless tobacco. He reports that he does not currently use alcohol. He reports that he does not currently use drugs. Medical:  has a past medical history of Anxiety, Arthritis, Basal cell carcinoma (BCC) in situ of skin, Chronic back pain, Colon polyps, Coronary artery disease, Depression, Diabetes mellitus without complication ([place]), Fibromyalgia, Gallstones, GERD (gastroesophageal reflux disease), Hepatitis B, HLD (hyperlipidemia), Hypertension, Osteoarthritis, Pneumonia, PONV (postoperative nausea and vomiting), SBO (small bowel obstruction) ([place]), and Sleep apnea. Surgical: Brandon [name]  has a past surgical history that includes Spinal cord stimulator insertion; Cholecystectomy, laparoscopic; Cataract extraction w/ intraocular lens  implant, bilateral; Lumbar fusion; XI Robotic assisted inguinal hernia repair with mesh; LEFT HEART CATH AND CORONARY ANGIOGRAPHY (N/A, 08/15/2020); Intravascular Ultrasound/IVUS (N/A, 08/15/2020); CORONARY STENT INTERVENTION (N/A, 08/15/2020); Cardiac catheterization (01/17/2021); LEFT HEART CATH AND CORONARY ANGIOGRAPHY (N/A, 01/17/2021); CORONARY STENT INTERVENTION (N/A, 01/17/2021); Microdiscectomy lumbar (2006); Spine surgery; Hernia repair; Drug induced endoscopy (N/A, 05/23/2022); and Cholecystectomy. Family: family history includes Anxiety disorder in his father and mother; CAD in his brother; COPD in his mother; Cancer in his father; Colon cancer in his mother; Coronary artery disease in his father; Depression in his father and mother; Leukemia in his maternal grandmother; Multiple sclerosis in his daughter.  Laboratory Chemistry Profile   Renal Lab Results  Component Value Date   BUN 13 07/13/2022   CREATININE 0.85 07/13/2022   BCR SEE NOTE: 03/26/2022   GFRAA >60 04/17/2020   GFRNONAA >60 07/13/2022    Hepatic Lab Results  Component Value Date   AST 18 07/13/2022   ALT 14 07/13/2022    ALBUMIN 3.6 07/13/2022   ALKPHOS 56 07/13/2022   LIPASE 37 07/12/2022    Electrolytes Lab Results  Component Value Date   NA 142 07/13/2022   K 3.7 07/13/2022   CL 104  07/13/2022   CALCIUM 8.9 07/13/2022   MG 1.9 07/13/2022   PHOS 3.2 07/13/2022    Bone Lab Results  Component Value Date   TESTOSTERONE 522 03/26/2022    Inflammation (CRP: Acute Phase) (ESR: Chronic Phase) Lab Results  Component Value Date   CRP 1.5 (H) 08/13/2020   ESRSEDRATE 17 (H) 08/13/2020   LATICACIDVEN 1.0 07/12/2022         Note: Above Lab results reviewed.  Recent Imaging Review  DG Abd Portable 1V-Small Bowel Obstruction Protocol-initial, 8 hr delay CLINICAL DATA:  Delayed image.  Small bowel obstruction.  EXAM: PORTABLE ABDOMEN - 1 VIEW  COMPARISON:  CT of the abdomen and pelvis 07/12/2022  FINDINGS: Oral contrast is distributed throughout the colon to the level of the rectum. Faint contrast is seen in the distal small bowel. No obstruction is present.  Spinal stimulator noted. Pedicle screw and rod fixation is present at L4-5.  IMPRESSION: Oral contrast is distributed throughout the colon to the level of the rectum. No evidence for obstruction.  Electronically Signed   By: San Morelle M.D.   On: 07/13/2022 17:36 Note: Reviewed        Physical Exam  General appearance: Well nourished, well developed, and well hydrated. In no apparent acute distress Mental status: Alert, oriented x 3 (person, place, & time)       Respiratory: No evidence of acute respiratory distress Eyes: PERLA Vitals: BP (!) 151/74   Pulse 76   Temp (!) 97.4 F (36.3 C) (Temporal)   Ht _0  (1.727 m)   Wt 185 lb (83.9 kg)   SpO2 99%   BMI 28.13 kg/m  BMI: Estimated body mass index is 28.13 kg/m as calculated from the following:   Height as of this encounter: _1  (1.727 m).   Weight as of this encounter: 185 lb (83.9 kg). Ideal: Ideal body weight: 68.4 kg (150 lb 12.7 oz) Adjusted ideal  body weight: 74.6 kg (164 lb 7.6 oz)  Cervical Spine Area Exam  Skin & Axial Inspection: No masses, redness, edema, swelling, or associated skin lesions Alignment: Symmetrical Functional ROM: Pain restricted ROM, to the right Stability: No instability detected Muscle Tone/Strength: Functionally intact. No obvious neuro-muscular anomalies detected. Sensory (Neurological): Dermatomal pain pattern Palpation: No palpable anomalies               Upper Extremity (UE) Exam      Side: Right upper extremity   Side: Left upper extremity  Skin & Extremity Inspection: Skin color, temperature, and hair growth are WNL. No peripheral edema or cyanosis. No masses, redness, swelling, asymmetry, or associated skin lesions. No contractures.   Skin & Extremity Inspection: Skin color, temperature, and hair growth are WNL. No peripheral edema or cyanosis. No masses, redness, swelling, asymmetry, or associated skin lesions. No contractures.  Functional ROM: Pain restricted ROM           Functional ROM: Unrestricted ROM          Muscle Tone/Strength: Functionally intact. No obvious neuro-muscular anomalies detected.   Muscle Tone/Strength: Functionally intact. No obvious neuro-muscular anomalies detected.  Sensory (Neurological): Dermatomal pain pattern           Sensory (Neurological): Unimpaired          Palpation: No palpable anomalies               Palpation: No palpable anomalies              Provocative Test(s):  Phalen's  test: deferred Tinel's test: deferred Apley's scratch test (touch opposite shoulder):  Action 1 (Across chest): Decreased ROM Action 2 (Overhead): Decreased ROM Action 3 (LB reach): Decreased ROM     Provocative Test(s):  Phalen's test: deferred Tinel's test: deferred Apley's scratch test (touch opposite shoulder):  Action 1 (Across chest): deferred Action 2 (Overhead): deferred Action 3 (LB reach): deferred          Thoracic Spine Area Exam  Skin & Axial Inspection: Well healed  scar from previous spine surgery detected Alignment: Symmetrical Functional ROM: Pain restricted ROM Stability: No instability detected Muscle Tone/Strength: Functionally intact. No obvious neuro-muscular anomalies detected. Sensory (Neurological): Neurogenic pain pattern Muscle strength & Tone: No palpable anomalies   Lumbar Spine Area Exam  Skin & Axial Inspection: Well healed scar from previous spine surgery detected Alignment: Symmetrical Functional ROM: Pain restricted ROM affecting both sides Stability: No instability detected Muscle Tone/Strength: Functionally intact. No obvious neuro-muscular anomalies detected. Sensory (Neurological): Dermatomal pain pattern + Lumbar facet extension positive for pain consistent with facet syndrome Palpation: IPG present    Gait & Posture Assessment  Ambulation: Limited Gait: Antalgic Posture: Difficulty standing up straight, due to pain    Lower Extremity Exam      Side: Right lower extremity   Side: Left lower extremity  Stability: No instability observed           Stability: No instability observed          Skin & Extremity Inspection: Skin color, temperature, and hair growth are WNL. No peripheral edema or cyanosis. No masses, redness, swelling, asymmetry, or associated skin lesions. No contractures.   Skin & Extremity Inspection: Skin color, temperature, and hair growth are WNL. No peripheral edema or cyanosis. No masses, redness, swelling, asymmetry, or associated skin lesions. No contractures.  Functional ROM: Pain restricted ROM for hip and knee joints           Functional ROM: Pain restricted ROM for hip and knee joints          Muscle Tone/Strength: Functionally intact. No obvious neuro-muscular anomalies detected.   Muscle Tone/Strength: Functionally intact. No obvious neuro-muscular anomalies detected.  Sensory (Neurological): Arthropathic arthralgia         Sensory (Neurological): Arthropathic arthralgia        DTR: Patellar:  deferred today Achilles: deferred today Plantar: deferred today   DTR: Patellar: deferred today Achilles: deferred today Plantar: deferred today  Palpation: No palpable anomalies   Palpation: No palpable anomalies       Assessment   Diagnosis Status  1. Post laminectomy syndrome   2. Chronic pain syndrome   3. Neuroforaminal stenosis of cervical spine    Controlled Controlled Controlled    Plan of Care  Problem-specific:  No problem-specific Assessment & Plan notes found for this encounter.  Brandon [name] has a current medication list which includes the following long-term medication(s): aripiprazole, atorvastatin, bupropion, insulin lispro, iron (ferrous sulfate), isosorbide mononitrate, metoclopramide, metoprolol succinate, pantoprazole, testosterone, and tizanidine.  Pharmacotherapy (Medications Ordered): Meds ordered this encounter  Medications   buprenorphine (BUTRANS) 15 MCG/HR    Sig: Place 1 patch onto the skin every Friday.    Dispense:  4 patch    Refill:  2    Chronic Pain: STOP Act (Not applicable) Fill 1 day early if closed on refill date. Avoid benzodiazepines within 8 hours of opioids   Orders:  No orders of the defined types were placed in this encounter.  Follow-up plan:  Return in about 3 months (around 10/23/2022) for Medication Management, in person.    Recent Visits Date Type Provider Dept  05/02/22 Office Visit Gillis Santa, MD [facility]  Showing recent visits within past 90 days and meeting all other requirements Today's Visits Date Type Provider Dept  07/23/22 Office Visit Gillis Santa, MD [facility]  Showing today's visits and meeting all other requirements Future Appointments Date Type Provider Dept  10/15/22 Appointment Gillis Santa, MD [facility]  Showing future appointments within next 90 days and meeting all other requirements  I discussed the assessment and treatment plan with the patient.  The patient was provided an opportunity to ask questions and all were answered. The patient agreed with the plan and demonstrated an understanding of the instructions.  Patient advised to call back or seek an in-person evaluation if the symptoms or condition worsens.  Duration of encounter: 78mnutes.  Total time on encounter, as per AMA guidelines included both the face-to-face and non-face-to-face time personally spent by the physician and/or other qualified health care professional(s) on the day of the encounter (includes time in activities that require the physician or other qualified health care professional and does not include time in activities normally performed by clinical staff). Physician's time may include the following activities when performed: preparing to see the patient (eg, review of tests, pre-charting review of records) obtaining and/or reviewing separately obtained history performing a medically appropriate examination and/or evaluation counseling and educating the patient/family/caregiver ordering medications, tests, or procedures referring and communicating with other health care professionals (when not separately reported) documenting clinical information in the electronic or other health record independently interpreting results (not separately reported) and communicating results to the patient/ family/caregiver care coordination (not separately reported)  Note by: BGillis Santa MD Date: 07/23/2022; Time: 10:42 AM

## 2022-07-24 ENCOUNTER — Encounter (HOSPITAL_BASED_OUTPATIENT_CLINIC_OR_DEPARTMENT_OTHER): Payer: Self-pay | Admitting: Otolaryngology

## 2022-07-24 ENCOUNTER — Other Ambulatory Visit: Payer: Self-pay | Admitting: Family Medicine

## 2022-07-24 ENCOUNTER — Other Ambulatory Visit: Payer: Self-pay

## 2022-07-24 ENCOUNTER — Other Ambulatory Visit (HOSPITAL_BASED_OUTPATIENT_CLINIC_OR_DEPARTMENT_OTHER): Payer: Self-pay

## 2022-07-24 ENCOUNTER — Telehealth: Payer: Self-pay | Admitting: Cardiology

## 2022-07-24 MED ORDER — TESTOSTERONE 50 MG/5GM (1%) TD GEL
5.0000 g | Freq: Every day | TRANSDERMAL | 1 refills | Status: DC
  Filled 2022-07-24 – 2022-08-27 (×3): qty 150, 30d supply, fill #0
  Filled 2022-09-24: qty 150, 30d supply, fill #1

## 2022-07-24 NOTE — Telephone Encounter (Signed)
I tried to call the pt back again and no answer. I then called his wife's phone (DPR) and left vm pt needs to call to set up tele pre op appt. Pt needs to hold his ASA and Plavix as of now and I did leave this on the vm for his wife, but pt needs to call back for tele pre op appt.

## 2022-07-24 NOTE — Telephone Encounter (Signed)
   Name: Brandon [name]  DOB: [birth year]  MRN: [id number]  Primary Cardiologist: Meriam Sprague, MD   Preoperative team, please contact this patient and set up a phone call appointment for further preoperative risk assessment. Please obtain consent and complete medication review. Thank you for your help.  I confirm that guidance regarding antiplatelet and oral anticoagulation therapy has been completed and, if necessary, noted below.  His aspirin and Plavix may be held for 5 to 7 days prior to his procedure.  Please resume as soon as hemostasis is achieved.   Ronney Asters, NP 07/24/2022, 1:07 PM [facility]

## 2022-07-24 NOTE — Telephone Encounter (Signed)
   Pre-operative Risk Assessment    Patient Name: Brandon [name]  DOB: 20-Mar-1956 MRN: [id number]      Request for Surgical Clearance    Procedure:   IMPLANTATION OF HYPOGLOSSAL NERVE STIMULATOR  Date of Surgery:  Clearance 07/30/22                                 Surgeon:  Dr. Annalee Genta  Surgeon's Group or Practice Name:  [facility]  Phone number:  [phone] Fax number:  [phone]   Type of Clearance Requested:   - Medical  - Pharmacy:  Hold Aspirin & Plavix - requesting 6 days prior    Type of Anesthesia:  General    Additional requests/questions:   OFFICE IS REQUESTING THAT PT STOP TAKING PLAVIX AND ASPIRIN TODAY   Signed, Forest Gleason   07/24/2022, 12:38 PM

## 2022-07-24 NOTE — Telephone Encounter (Signed)
Pt returning call, informed him of VM, Okey Regal will c/b after 5pm.

## 2022-07-24 NOTE — Progress Notes (Signed)
   07/24/22 1300  Pre-op Phone Call  Surgery Date Verified 07/30/22  Arrival Time Verified 1000  Surgery Location Verified [facility]  Medical History Reviewed Yes  Is the patient taking a GLP-1 receptor agonist? (S)  Yes (LD 07-17-22)  Has the patient been informed on holding medication? Yes  Does the patient have diabetes? Type II  Does the patient use a Continuous Blood Glucose Monitor? Yes  Location of sensor? Left Arm  Is the patient on an insulin pump? Yes  Has the diabetes coordinator been notified? (S)  Yes (note in EPIC from Dr Wyonia Hough to leave pump on basal rate for sx)  Do you have a history of heart problems? (S)  Yes (Hx CAD-stents x3)  Cardiologist Name Dr Shari Prows  Have you ever had tests on your heart? Yes  What cardiac tests were performed? Cardiac Cath;Echo  What date/year were cardiac tests completed? 11-01-21 ECHO EF 60-65%, %-25-22 heart cath stents x2 Circumflex  Results viewable: [facility] Media Tab  Antiarrhythmic device type  (na)  Patient educated on enhanced recovery. (S)  Yes (G2 0800)  Patient educated about smoking cessation 24 hours prior to surgery. N/A Non-Smoker  Patient verbalizes understanding of bowel prep? N/A  Med Rec Completed Yes  Take the Following Meds the Morning of Surgery take wellbutrin, abilify, isosorbide, metoprolol, protonix and tizanidine, leave butrans patch on, either turn off spinal stimulator at home or bring control to turn off before sx  Recent  Lab Work, EKG, CXR? (S)  Yes (07-13-22 had CBC/diff, CMP, 02-15-22 EKG)  Allowed clear liquids Water  Stop Solids, Milk, Candy, and Gum STARTING AT MIDNIGHT  Responsible adult to drive and be with you for 24 hours? Yes  Name & Phone Number for Ride/Caregiver wife dea  No Jewelry, money, nail polish or make-up.  No lotions, powders, perfumes. No shaving  48 hrs. prior to surgery. Yes  Contacts, Dentures & Glasses Will Have to be Removed Before OR. Yes  Please bring your ID and Insurance Card the  morning of your surgery. (Surgery Centers Only) Yes  Bring any papers or x-rays with you that your surgeon gave you. Yes  Instructed to contact the location of procedure/ provider if they or anyone in their household develops symptoms or tests positive for COVID-19, has close contact with someone who tests positive for COVID, or has known exposure to any contagious illness. Yes  Call this number the morning of surgery  with any problems that may cancel your surgery. [phone]  Covid-19 Assessment  Have you had a positive COVID-19 test within the previous 90 days? No  COVID Testing Guidance Proceed with the additional questions.

## 2022-07-24 NOTE — Progress Notes (Signed)
   07/24/22 1300  PAT Phone Screen  Is the patient taking a GLP-1 receptor agonist? (S)  Yes (LD 07-17-22)  Has the patient been informed on holding medication? Yes  Do You Have Diabetes? Yes  Do You Have Hypertension? Yes  Have You Ever Been to the ER for Asthma? No  Have You Taken Oral Steroids in the Past 3 Months? No  Do you Take Phenteramine or any Other Diet Drugs? No  Recent  Lab Work, EKG, CXR? (S)  Yes (07-13-22 had CBC/diff, CMP, 02-15-22 EKG)  Do you have a history of heart problems? (S)  Yes (Hx CAD-stents x3)  Cardiologist Name Dr Shari Prows  Have you ever had tests on your heart? Yes  What cardiac tests were performed? Cardiac Cath;Echo  What date/year were cardiac tests completed? 11-01-21 ECHO EF 60-65%, %-25-22 heart cath stents x2 Circumflex  Results viewable: [facility] Media Tab  Any Recent Hospitalizations? (S)  Yes (hospitalized 07-12-10-20-23 with SBO- pt states resolved)  Height 5\' 8"  (1.727 m)  Weight 85.5 kg  Pat Appointment Scheduled No  Reason for No Appointment Not Needed

## 2022-07-24 NOTE — Telephone Encounter (Signed)
Left message for the pt to call back to set up tele pre op appt 

## 2022-07-24 NOTE — Telephone Encounter (Signed)
I returned pt's call and left vm to call back.

## 2022-07-24 NOTE — Telephone Encounter (Signed)
Pt returning call

## 2022-07-25 ENCOUNTER — Encounter: Payer: Self-pay | Admitting: Family Medicine

## 2022-07-25 ENCOUNTER — Other Ambulatory Visit (HOSPITAL_BASED_OUTPATIENT_CLINIC_OR_DEPARTMENT_OTHER): Payer: Self-pay

## 2022-07-25 ENCOUNTER — Ambulatory Visit (INDEPENDENT_AMBULATORY_CARE_PROVIDER_SITE_OTHER): Payer: No Typology Code available for payment source | Admitting: Family Medicine

## 2022-07-25 VITALS — BP 122/68 | HR 78 | Ht 68.0 in | Wt 196.0 lb

## 2022-07-25 DIAGNOSIS — K56609 Unspecified intestinal obstruction, unspecified as to partial versus complete obstruction: Secondary | ICD-10-CM | POA: Diagnosis not present

## 2022-07-25 NOTE — Telephone Encounter (Signed)
Pt returning call

## 2022-07-25 NOTE — Progress Notes (Signed)
Brandon [name] - 66 y.o. male MRN [id number]  Date of birth: 1956-08-02  Subjective Chief Complaint  Patient presents with   Hospitalization Follow-up    HPI Brandon [name] is a 66 year old male here today for hospital follow-up.  He was admitted recently for partial small bowel obstruction.  He was treated conservatively with IV fluid hydration.  Electrolytes were repleted.  He began having bowel movements spontaneously and was tolerating diet prior to discharge.  Home medications were resumed prior to discharge.  He denies any additional abdominal pain or changes in stools.  He remains on daily stool softener.  He does remain on a Butrans patch.  He does have upcoming procedure to have inspire device placed for treatment of sleep apnea.  ROS:  A comprehensive ROS was completed and negative except as noted per HPI  Allergies  Allergen Reactions   Kiwi Extract Swelling    Mouth swelling.    Other Swelling and Other (See Comments)    EGGPLANT. Mouth swelling.   Penicillins Rash    Reaction: unknown   Ancef [Cefazolin] Rash   Latex Rash   Levaquin [Levofloxacin] Rash   Peach [Prunus Persica] Other (See Comments)    Burns mouth    Past Medical History:  Diagnosis Date   Anxiety    Arthritis    Basal cell carcinoma (BCC) in situ of skin    left neck   Chronic back pain    has spinal cord stimulator and wears buprenorphine patch   Colon polyps    Coronary artery disease    S/p DES to mRCA in 12/21 // S/p DES to LCx x 2 in 12/2020 // Diff dz in small to mod Dx (not a good target for PCI); OM1 100 CTO; severe dz in small OM2 and PDA >> Med Rx   Depression    Diabetes mellitus without complication (HCC)    type 2   Fibromyalgia    Gallstones    GERD (gastroesophageal reflux disease)    Hepatitis B    HLD (hyperlipidemia)    Hypertension    Osteoarthritis    Pneumonia    PONV (postoperative nausea and vomiting)    SBO (small bowel obstruction) ([place])    Sleep apnea    does not use  CPAP    Past Surgical History:  Procedure Laterality Date   CARDIAC CATHETERIZATION  01/17/2021   CATARACT EXTRACTION W/ INTRAOCULAR LENS  IMPLANT, BILATERAL     CHOLECYSTECTOMY     CHOLECYSTECTOMY, LAPAROSCOPIC     CORONARY STENT INTERVENTION N/A 08/15/2020   Procedure: CORONARY STENT INTERVENTION;  Surgeon: Jettie Booze, MD;  Location: [place] [facility];  Service: Cardiovascular;  Laterality: N/A;   CORONARY STENT INTERVENTION N/A 01/17/2021   Procedure: CORONARY STENT INTERVENTION;  Surgeon: Burnell Blanks, MD;  Location: [place] [facility];  Service: Cardiovascular;  Laterality: N/A;   DRUG INDUCED ENDOSCOPY N/A 05/23/2022   Procedure: DRUG INDUCED SLEEP ENDOSCOPY;  Surgeon: Jerrell Belfast, MD;  Location: [facility];  Service: ENT;  Laterality: N/A;   HERNIA REPAIR     INTRAVASCULAR ULTRASOUND/IVUS N/A 08/15/2020   Procedure: Intravascular Ultrasound/IVUS;  Surgeon: Jettie Booze, MD;  Location: [place] [facility];  Service: Cardiovascular;  Laterality: N/A;   LEFT HEART CATH AND CORONARY ANGIOGRAPHY N/A 08/15/2020   Procedure: LEFT HEART CATH AND CORONARY ANGIOGRAPHY;  Surgeon: Jettie Booze, MD;  Location: [place] [facility];  Service: Cardiovascular;  Laterality: N/A;   LEFT HEART CATH AND  CORONARY ANGIOGRAPHY N/A 01/17/2021   Procedure: LEFT HEART CATH AND CORONARY ANGIOGRAPHY;  Surgeon: Kathleene Hazel, MD;  [facility];  Service: Cardiovascular;  Laterality: N/A;   LUMBAR FUSION     L3-5 with rods   MICRODISCECTOMY LUMBAR  2006   SPINAL CORD STIMULATOR INSERTION     blood clot removed from spinal cord   SPINE SURGERY     blood clot removed from spinal cord   XI ROBOTIC ASSISTED INGUINAL HERNIA REPAIR WITH MESH     x 3 surgeries    Social History   Socioeconomic History   Marital status: Married    Spouse name: Not on file   Number of children: 4   Years of education: 16   Highest education level: Not on file   Occupational History   Occupation: disabled/ respiratory therapist  Tobacco Use   Smoking status: Former    Packs/day: 1.00    Years: 30.00    Total pack years: 30.00    Types: Cigarettes    Quit date: 2007    Years since quitting: 16.9   Smokeless tobacco: Never  Vaping Use   Vaping Use: Never used  Substance and Sexual Activity   Alcohol use: Not Currently   Drug use: Not Currently   Sexual activity: Yes  Other Topics Concern   Not on file  Social History Narrative   Not on file   Social Determinants of Health   Financial Resource Strain: Not on file  Food Insecurity: No Food Insecurity (07/13/2022)   Hunger Vital Sign    Worried About Running Out of Food in the Last Year: Never true    Ran Out of Food in the Last Year: Never true  Transportation Needs: No Transportation Needs (07/13/2022)   PRAPARE - Administrator, Civil Service (Medical): No    Lack of Transportation (Non-Medical): No  Physical Activity: Not on file  Stress: Not on file  Social Connections: Not on file    Family History  Problem Relation Age of Onset   COPD Mother    Anxiety disorder Mother    Depression Mother    Colon cancer Mother    Cancer Father        mets, origin unknown   Anxiety disorder Father    Depression Father    Coronary artery disease Father    CAD Brother    Leukemia Maternal Grandmother    Multiple sclerosis Daughter    Diabetes Neg Hx     Health Maintenance  Topic Date Due   DTaP/Tdap/Td (1 - Tdap) Never done   OPHTHALMOLOGY EXAM  09/26/2022 (Originally 04/24/2022)   COVID-19 Vaccine (3 - 2023-24 season) 09/26/2022 (Originally 04/26/2022)   Medicare Annual Wellness (AWV)  09/26/2022 (Originally 08-Jan-1956)   Zoster Vaccines- Shingrix (1 of 2) 10/24/2022 (Originally 09/13/2005)   INFLUENZA VACCINE  11/24/2022 (Originally 03/26/2022)   COLONOSCOPY (Pts 45-62yrs Insurance coverage will need to be confirmed)  01/10/2023 (Originally 09/13/2000)   Hepatitis C  Screening  01/10/2023 (Originally 09/13/1973)   HEMOGLOBIN A1C  11/11/2022   Diabetic kidney evaluation - Urine ACR  03/27/2023   FOOT EXAM  05/14/2023   Diabetic kidney evaluation - GFR measurement  07/14/2023   Pneumonia Vaccine 61+ Years old  Completed   HPV VACCINES  Aged Out     ----------------------------------------------------------------------------------------------------------------------------------------------------------------------------------------------------------------- Physical Exam BP 122/68 (BP Location: Left Arm, Patient Position: Sitting, Cuff Size: Normal)   Pulse 78   Ht 5\' 8"  (1.727 m)  Wt 196 lb (88.9 kg)   SpO2 97%   BMI 29.80 kg/m   Physical Exam Constitutional:      Appearance: Normal appearance.  HENT:     Head: Normocephalic and atraumatic.  Eyes:     General: No scleral icterus. Cardiovascular:     Rate and Rhythm: Normal rate and regular rhythm.  Pulmonary:     Effort: Pulmonary effort is normal.     Breath sounds: Normal breath sounds.  Musculoskeletal:     Cervical back: Neck supple.  Neurological:     Mental Status: He is alert.  Psychiatric:        Mood and Affect: Mood normal.        Behavior: Behavior normal.     ------------------------------------------------------------------------------------------------------------------------------------------------------------------------------------------------------------------- Assessment and Plan  Small bowel obstruction ([place]) He has had couple episodes of small bowel obstruction.  Taking daily stool softener and staying well-hydrated.  Updating electrolytes today.   No orders of the defined types were placed in this encounter.   No follow-ups on file.    This visit occurred during the SARS-CoV-2 public health emergency.  Safety protocols were in place, including screening questions prior to the visit, additional usage of staff PPE, and extensive cleaning of exam room while  observing appropriate contact time as indicated for disinfecting solutions.

## 2022-07-25 NOTE — Assessment & Plan Note (Signed)
He has had couple episodes of small bowel obstruction.  Taking daily stool softener and staying well-hydrated.  Updating electrolytes today.

## 2022-07-26 ENCOUNTER — Ambulatory Visit: Payer: No Typology Code available for payment source | Attending: General Practice | Admitting: Student

## 2022-07-26 ENCOUNTER — Telehealth: Payer: Self-pay | Admitting: *Deleted

## 2022-07-26 DIAGNOSIS — Z01810 Encounter for preprocedural cardiovascular examination: Secondary | ICD-10-CM | POA: Diagnosis not present

## 2022-07-26 LAB — CBC WITH DIFFERENTIAL/PLATELET
Absolute Monocytes: 442 cells/uL (ref 200–950)
Basophils Absolute: 21 cells/uL (ref 0–200)
Basophils Relative: 0.4 %
Eosinophils Absolute: 62 cells/uL (ref 15–500)
Eosinophils Relative: 1.2 %
HCT: 45.5 % (ref 38.5–50.0)
Hemoglobin: 15.9 g/dL (ref 13.2–17.1)
Lymphs Abs: 1123 cells/uL (ref 850–3900)
MCH: 33 pg (ref 27.0–33.0)
MCHC: 34.9 g/dL (ref 32.0–36.0)
MCV: 94.4 fL (ref 80.0–100.0)
MPV: 11.4 fL (ref 7.5–12.5)
Monocytes Relative: 8.5 %
Neutro Abs: 3552 cells/uL (ref 1500–7800)
Neutrophils Relative %: 68.3 %
Platelets: 221 10*3/uL (ref 140–400)
RBC: 4.82 10*6/uL (ref 4.20–5.80)
RDW: 11.8 % (ref 11.0–15.0)
Total Lymphocyte: 21.6 %
WBC: 5.2 10*3/uL (ref 3.8–10.8)

## 2022-07-26 LAB — COMPLETE METABOLIC PANEL WITH GFR
AG Ratio: 2 (calc) (ref 1.0–2.5)
ALT: 11 U/L (ref 9–46)
AST: 18 U/L (ref 10–35)
Albumin: 4.5 g/dL (ref 3.6–5.1)
Alkaline phosphatase (APISO): 71 U/L (ref 35–144)
BUN: 8 mg/dL (ref 7–25)
CO2: 27 mmol/L (ref 20–32)
Calcium: 9 mg/dL (ref 8.6–10.3)
Chloride: 105 mmol/L (ref 98–110)
Creat: 0.7 mg/dL (ref 0.70–1.35)
Globulin: 2.3 g/dL (calc) (ref 1.9–3.7)
Glucose, Bld: 161 mg/dL — ABNORMAL HIGH (ref 65–99)
Potassium: 3.9 mmol/L (ref 3.5–5.3)
Sodium: 141 mmol/L (ref 135–146)
Total Bilirubin: 0.8 mg/dL (ref 0.2–1.2)
Total Protein: 6.8 g/dL (ref 6.1–8.1)
eGFR: 102 mL/min/{1.73_m2} (ref >=60)

## 2022-07-26 NOTE — Progress Notes (Signed)

## 2022-07-26 NOTE — Telephone Encounter (Signed)
Called and left message for the pt to call back for tele pre op appt.  I will update the requesting office as FYI that we have been playing a game of phone tag with the pt for tele pre op appt.

## 2022-07-26 NOTE — Telephone Encounter (Signed)
I s/w the pt and he has been scheduled today as pre op add on due to med hold and date of procedure. Pt stated he has been holding his Plavix since 07/21/22. Med rec and consent are done.

## 2022-07-26 NOTE — Progress Notes (Signed)
Virtual Visit via Telephone Note   Because of Brandon [name]'s co-morbid illnesses, he is at least at moderate risk for complications without adequate follow up.  This format is felt to be most appropriate for this patient at this time.  The patient did not have access to video technology/had technical difficulties with video requiring transitioning to audio format only (telephone).  All issues noted in this document were discussed and addressed.  No physical exam could be performed with this format.  Please refer to the patient's chart for his consent to telehealth for [facility].  Evaluation Performed:  Preoperative cardiovascular risk assessment _____________   Date:  07/26/2022   Patient ID:  Brandon [name], DOB Jul 10, 1956, MRN [id number] Patient Location:  Home Provider location:   Office  Primary Care Provider:  Everrett Coombe, DO Primary Cardiologist:  Meriam Sprague, MD  Chief Complaint / Patient Profile   66 y.o. y/o male with a h/o CAD s/p PCI to RCA 2021 and Cx 2022, hypertension, hyperlipidemia, T2DM who is pending Implantation of hypoglossal nerve stimulator and presents today for telephonic preoperative cardiovascular risk assessment.  History of Present Illness    Brandon [name] is a 66 y.o. male who presents via audio/video conferencing for a telehealth visit today.  Pt was last seen in cardiology clinic on 02/15/2022 by Dr. Shari Prows.  At that time SHAMOND CURY was doing well.  The patient is now pending procedure as outlined above. Since his last visit, he has been stable from a cardiac standpoint. Patient denies shortness of breath or dyspnea on exertion. No chest pain, pressure, or tightness. Denies lower extremity edema, orthopnea, or PND. No palpitations. He reports activity equivalent to >4.0 METS to include cardio exercise at a local gym 2-3 times a week.   Past Medical History    Past Medical History:  Diagnosis Date   Anxiety    Arthritis     Basal cell carcinoma (BCC) in situ of skin    left neck   Chronic back pain    has spinal cord stimulator and wears buprenorphine patch   Colon polyps    Coronary artery disease    S/p DES to mRCA in 12/21 // S/p DES to LCx x 2 in 12/2020 // Diff dz in small to mod Dx (not a good target for PCI); OM1 100 CTO; severe dz in small OM2 and PDA >> Med Rx   Depression    Diabetes mellitus without complication (HCC)    type 2   Fibromyalgia    Gallstones    GERD (gastroesophageal reflux disease)    Hepatitis B    HLD (hyperlipidemia)    Hypertension    Osteoarthritis    Pneumonia    PONV (postoperative nausea and vomiting)    SBO (small bowel obstruction) (HCC)    Sleep apnea    does not use CPAP   Past Surgical History:  Procedure Laterality Date   CARDIAC CATHETERIZATION  01/17/2021   CATARACT EXTRACTION W/ INTRAOCULAR LENS  IMPLANT, BILATERAL     CHOLECYSTECTOMY     CHOLECYSTECTOMY, LAPAROSCOPIC     CORONARY STENT INTERVENTION N/A 08/15/2020   Procedure: CORONARY STENT INTERVENTION;  Surgeon: Corky Crafts, MD;  [facility];  Service: Cardiovascular;  Laterality: N/A;   CORONARY STENT INTERVENTION N/A 01/17/2021   Procedure: CORONARY STENT INTERVENTION;  Surgeon: Kathleene Hazel, MD;  [facility];  Service: Cardiovascular;  Laterality: N/A;  DRUG INDUCED ENDOSCOPY N/A 05/23/2022   Procedure: DRUG INDUCED SLEEP ENDOSCOPY;  Surgeon: Osborn Coho, MD;  Location[facility];  Service: ENT;  Laterality: N/A;   HERNIA REPAIR     INTRAVASCULAR ULTRASOUND/IVUS N/A 08/15/2020   Procedure: Intravascular Ultrasound/IVUS;  Surgeon: Corky Crafts, MD;  [facility]: [facility];  Service: Cardiovascular;  Laterality: N/A;   LEFT HEART CATH AND CORONARY ANGIOGRAPHY N/A 08/15/2020   Procedure: LEFT HEART CATH AND CORONARY ANGIOGRAPHY;  Surgeon: Corky Crafts, MD;  Location: [facility];  Service: Cardiovascular;   Laterality: N/A;   LEFT HEART CATH AND CORONARY ANGIOGRAPHY N/A 01/17/2021   Procedure: LEFT HEART CATH AND CORONARY ANGIOGRAPHY;  Surgeon: Kathleene Hazel, MD;  [facility];  Service: Cardiovascular;  Laterality: N/A;   LUMBAR FUSION     L3-5 with rods   MICRODISCECTOMY LUMBAR  2006   SPINAL CORD STIMULATOR INSERTION     blood clot removed from spinal cord   SPINE SURGERY     blood clot removed from spinal cord   XI ROBOTIC ASSISTED INGUINAL HERNIA REPAIR WITH MESH     x 3 surgeries    Allergies  Allergies  Allergen Reactions   Kiwi Extract Swelling    Mouth swelling.    Other Swelling and Other (See Comments)    EGGPLANT. Mouth swelling.   Penicillins Rash    Reaction: unknown   Ancef [Cefazolin] Rash   Latex Rash   Levaquin [Levofloxacin] Rash   Peach [Prunus Persica] Other (See Comments)    Burns mouth    Home Medications    Prior to Admission medications   Medication Sig Start Date End Date Taking? Authorizing Provider  acetaminophen (TYLENOL) 500 MG tablet Take 1,000 mg by mouth See admin instructions. Take 2 tablets (1000 mg) by mouth scheduled in the morning & may take 2 tablets (1000 mg) by mouth every 6 hours if needed for pain.    [provider]  ARIPiprazole (ABILIFY) 10 MG tablet Take 1 tablet (10 mg total) by mouth every morning. 05/02/22   Everrett Coombe, DO  aspirin 81 MG EC tablet Take 81 mg by mouth every morning.    [provider]  atorvastatin (LIPITOR) 80 MG tablet Take 1 tablet (80 mg total) by mouth daily. 05/20/22   Meriam Sprague, MD  b complex vitamins capsule Take 1 capsule by mouth in the morning.    [provider]  buprenorphine Lavera Guise) 15 MCG/HR Place 1 patch onto the skin every Friday. 07/26/22 10/18/22  Edward Jolly, MD  buPROPion (WELLBUTRIN XL) 300 MG 24 hr tablet Take 1 tablet (300 mg total) by mouth daily. 06/04/22   Everrett Coombe, DO  clopidogrel (PLAVIX) 75 MG tablet TAKE 1  TABLET (75 MG TOTAL) BY MOUTH DAILY WITH BREAKFAST. Patient taking differently: Take 75 mg by mouth daily with breakfast. 07/17/21 09/05/22  Meriam Sprague, MD  Continuous Blood Gluc Sensor (DEXCOM G6 SENSOR) MISC Use as directed. Change sensor every 10 days 01/02/22   Shamleffer, Konrad Dolores, MD  docusate sodium (COLACE) 100 MG capsule Take 200 mg by mouth in the morning.    [provider]  empagliflozin (JARDIANCE) 25 MG TABS tablet Take 1 tablet (25 mg total) by mouth daily. 11/08/21   Matthews, Selena Batten, DO  GVOKE HYPOPEN 1-PACK 1 MG/0.2ML SOAJ Use as needed for hypoglycemia Patient taking differently: 1 mg once. Use as needed for hypoglycemia 05/13/22   Carlus Pavlov, MD  insulin  lispro (HUMALOG) 100 UNIT/ML injection For use in pump, total of 60 units per day Patient taking differently: Inject 60 Units into the skin See admin instructions. For use in pump, total of 60 units per day 10/19/21   Romero Belling, MD  Iron, Ferrous Sulfate, 325 (65 Fe) MG TABS Take 1 tablet (325 mg) by mouth daily. 02/01/22   Everrett Coombe, DO  isosorbide mononitrate (IMDUR) 30 MG 24 hr tablet Take 1 tablet (30 mg total) by mouth daily. 04/30/22   Meriam Sprague, MD  lidocaine (LIDODERM) 5 % Apply 1 - 3 patches on to the skin as directed. 12 hours on and 12 hours off. 08/10/21     magnesium oxide (MAG-OX) 400 (240 Mg) MG tablet Take 800 mg by mouth in the morning.    [provider]  metoCLOPramide (REGLAN) 10 MG tablet Take 10 mg by mouth daily as needed for nausea or vomiting (upset stomach).    [provider]  metoprolol succinate (TOPROL XL) 25 MG 24 hr tablet Take 1 tablet (25 mg total) by mouth daily. 10/24/21   Meriam Sprague, MD  Na Sulfate-K Sulfate-Mg Sulf 17.5-3.13-1.6 GM/177ML SOLN Take by mouth as directed 03/13/22   Jenel Lucks, MD  nitroGLYCERIN (NITROSTAT) 0.4 MG SL tablet Place 0.4 mg under the tongue every 5 (five) minutes x 3 doses as needed for chest  pain.    [provider]  ondansetron (ZOFRAN-ODT) 4 MG disintegrating tablet Take 1 tablet (4 mg total) by mouth every 8 (eight) hours as needed for nausea or vomiting. 05/02/22   Everrett Coombe, DO  pantoprazole (PROTONIX) 40 MG tablet Take 1 tablet (40 mg total) by mouth daily. 06/04/22   Everrett Coombe, DO  Semaglutide, 2 MG/DOSE, (OZEMPIC, 2 MG/DOSE,) 8 MG/3ML SOPN Inject 2 mg into the skin once a week. Patient taking differently: Inject 2 mg into the skin every Wednesday. 03/12/22   Carlus Pavlov, MD  tamsulosin (FLOMAX) 0.4 MG CAPS capsule Take 1 capsule (0.4 mg total) by mouth daily. 05/02/22   Everrett Coombe, DO  testosterone (ANDROGEL) 50 MG/5GM (1%) GEL Place 5 g (1 packet) onto the shoulders and upper arms daily as directed. 07/24/22 08/23/22  Everrett Coombe, DO  tiZANidine (ZANAFLEX) 4 MG tablet Take 1-1&1/2 tablets (4-6 mg total) by mouth every 8 (eight) hours as needed for muscle spasms. Patient taking differently: Take 8 mg by mouth every 8 (eight) hours as needed for muscle spasms. 02/05/22   Edward Jolly, MD    Physical Exam    Vital Signs:  ABHIJIT NOLTING does not have vital signs available for review today.  Given telephonic nature of communication, physical exam is limited. AAOx3. NAD. Normal affect.  Speech and respirations are unlabored.  Accessory Clinical Findings    None  Assessment & Plan    Primary Cardiologist: Meriam Sprague, MD  Preoperative cardiovascular risk assessment. Implantation of hypoglossal nerve stimulator, Dr. Annalee Genta, [facility].   Chart reviewed as part of pre-operative protocol coverage. Patient does not have a history of ischemic heart disease, PCI, heart failure, or stroke. According to the RCRI, patient has a 6.6% risk of MACE. Patient reports activity equivalent to >4.0 METS (cardio exercise at the gym 2-3 times a week).   Given past medical history and time since last visit, based on ACC/AHA guidelines, VERE PELLETT  would be at acceptable risk for the planned procedure without further cardiovascular testing.   Patient was advised that if he develops new  symptoms prior to surgery to contact our office to arrange a follow-up appointment.  he verbalized understanding.  2. Plavix and Aspirin. Patient may hold Plavix and aspirin 5-7 days prior to procedure and resume as soon as hemostasis is achieved.   I will route this recommendation to the requesting party via Epic fax function.  Please call with questions.  Time:   Today, I have spent 5 minutes with the patient with telehealth technology discussing medical history, symptoms, and management plan.     Carlos Levering, NP  07/26/2022, 10:03 AM

## 2022-07-26 NOTE — Telephone Encounter (Signed)
I s/w the pt and he has been scheduled today as pre op add on due to med hold and date of procedure. Pt stated he has been holding his Plavix since 07/21/22. Med rec and consent are done.     Patient Consent for Virtual Visit        Brandon [name] has provided verbal consent on 07/26/2022 for a virtual visit (video or telephone).   CONSENT FOR VIRTUAL VISIT FOR:  Brandon [name]  By participating in this virtual visit I agree to the following:  I hereby voluntarily request, consent and authorize [facility] and its employed or contracted physicians, physician assistants, nurse practitioners or other licensed health care professionals (the Practitioner), to provide me with telemedicine health care services (the "Services") as deemed necessary by the treating Practitioner. I acknowledge and consent to receive the Services by the Practitioner via telemedicine. I understand that the telemedicine visit will involve communicating with the Practitioner through live audiovisual communication technology and the disclosure of certain medical information by electronic transmission. I acknowledge that I have been given the opportunity to request an in-person assessment or other available alternative prior to the telemedicine visit and am voluntarily participating in the telemedicine visit.  I understand that I have the right to withhold or withdraw my consent to the use of telemedicine in the course of my care at any time, without affecting my right to future care or treatment, and that the Practitioner or I may terminate the telemedicine visit at any time. I understand that I have the right to inspect all information obtained and/or recorded in the course of the telemedicine visit and may receive copies of available information for a reasonable fee.  I understand that some of the potential risks of receiving the Services via telemedicine include:  Delay or interruption in medical evaluation due to  technological equipment failure or disruption; Information transmitted may not be sufficient (e.g. poor resolution of images) to allow for appropriate medical decision making by the Practitioner; and/or  In rare instances, security protocols could fail, causing a breach of personal health information.  Furthermore, I acknowledge that it is my responsibility to provide information about my medical history, conditions and care that is complete and accurate to the best of my ability. I acknowledge that Practitioner's advice, recommendations, and/or decision may be based on factors not within their control, such as incomplete or inaccurate data provided by me or distortions of diagnostic images or specimens that may result from electronic transmissions. I understand that the practice of medicine is not an exact science and that Practitioner makes no warranties or guarantees regarding treatment outcomes. I acknowledge that a copy of this consent can be made available to me via my patient portal [facility] MyChart), or I can request a printed copy by calling the office of [facility].    I understand that my insurance will be billed for this visit.   I have read or had this consent read to me. I understand the contents of this consent, which adequately explains the benefits and risks of the Services being provided via telemedicine.  I have been provided ample opportunity to ask questions regarding this consent and the Services and have had my questions answered to my satisfaction. I give my informed consent for the services to be provided through the use of telemedicine in my medical care

## 2022-07-28 ENCOUNTER — Other Ambulatory Visit: Payer: Self-pay

## 2022-07-29 ENCOUNTER — Other Ambulatory Visit (HOSPITAL_BASED_OUTPATIENT_CLINIC_OR_DEPARTMENT_OTHER): Payer: Self-pay

## 2022-07-29 ENCOUNTER — Other Ambulatory Visit: Payer: Self-pay | Admitting: *Deleted

## 2022-07-29 MED ORDER — METOPROLOL SUCCINATE ER 25 MG PO TB24
25.0000 mg | ORAL_TABLET | Freq: Every day | ORAL | 1 refills | Status: DC
  Filled 2022-07-29: qty 90, 90d supply, fill #0

## 2022-07-29 NOTE — Progress Notes (Signed)
Chart reviewed with Dr. Hart Rochester due to recent hospitalization, cardiac history and spinal cord stimulator. Ok to proceed with surgery as planned at [facility] without further testing. Spoke with patient to please be sure to bring remote control to spinal cord stimulator.

## 2022-07-30 ENCOUNTER — Ambulatory Visit (HOSPITAL_BASED_OUTPATIENT_CLINIC_OR_DEPARTMENT_OTHER)
Admission: RE | Admit: 2022-07-30 | Discharge: 2022-07-30 | Disposition: A | Discharge status: Home / Self Care | Payer: No Typology Code available for payment source | Attending: Otolaryngology | Admitting: Otolaryngology

## 2022-07-30 ENCOUNTER — Ambulatory Visit (HOSPITAL_BASED_OUTPATIENT_CLINIC_OR_DEPARTMENT_OTHER): Payer: No Typology Code available for payment source | Admitting: Certified Registered"

## 2022-07-30 ENCOUNTER — Encounter (HOSPITAL_BASED_OUTPATIENT_CLINIC_OR_DEPARTMENT_OTHER)
Admission: RE | Disposition: A | Discharge status: Home / Self Care | Payer: Self-pay | Source: Home / Self Care | Attending: Otolaryngology

## 2022-07-30 ENCOUNTER — Other Ambulatory Visit: Payer: Self-pay

## 2022-07-30 ENCOUNTER — Ambulatory Visit (HOSPITAL_COMMUNITY): Payer: No Typology Code available for payment source

## 2022-07-30 ENCOUNTER — Other Ambulatory Visit (HOSPITAL_BASED_OUTPATIENT_CLINIC_OR_DEPARTMENT_OTHER): Payer: Self-pay

## 2022-07-30 ENCOUNTER — Encounter (HOSPITAL_BASED_OUTPATIENT_CLINIC_OR_DEPARTMENT_OTHER): Payer: Self-pay | Admitting: Otolaryngology

## 2022-07-30 DIAGNOSIS — E119 Type 2 diabetes mellitus without complications: Secondary | ICD-10-CM | POA: Insufficient documentation

## 2022-07-30 DIAGNOSIS — Z6829 Body mass index (BMI) 29.0-29.9, adult: Secondary | ICD-10-CM | POA: Diagnosis not present

## 2022-07-30 DIAGNOSIS — M797 Fibromyalgia: Secondary | ICD-10-CM | POA: Diagnosis not present

## 2022-07-30 DIAGNOSIS — I10 Essential (primary) hypertension: Secondary | ICD-10-CM | POA: Insufficient documentation

## 2022-07-30 DIAGNOSIS — Z01818 Encounter for other preprocedural examination: Secondary | ICD-10-CM

## 2022-07-30 DIAGNOSIS — Z87891 Personal history of nicotine dependence: Secondary | ICD-10-CM | POA: Diagnosis not present

## 2022-07-30 DIAGNOSIS — K219 Gastro-esophageal reflux disease without esophagitis: Secondary | ICD-10-CM | POA: Diagnosis not present

## 2022-07-30 DIAGNOSIS — J988 Other specified respiratory disorders: Secondary | ICD-10-CM | POA: Insufficient documentation

## 2022-07-30 DIAGNOSIS — I2510 Atherosclerotic heart disease of native coronary artery without angina pectoris: Secondary | ICD-10-CM | POA: Diagnosis not present

## 2022-07-30 DIAGNOSIS — Z794 Long term (current) use of insulin: Secondary | ICD-10-CM | POA: Insufficient documentation

## 2022-07-30 DIAGNOSIS — G4733 Obstructive sleep apnea (adult) (pediatric): Secondary | ICD-10-CM | POA: Diagnosis present

## 2022-07-30 DIAGNOSIS — F418 Other specified anxiety disorders: Secondary | ICD-10-CM

## 2022-07-30 HISTORY — PX: IMPLANTATION OF HYPOGLOSSAL NERVE STIMULATOR: SHX6827

## 2022-07-30 LAB — GLUCOSE, CAPILLARY
Glucose-Capillary: 150 mg/dL — ABNORMAL HIGH (ref 70–99)
Glucose-Capillary: 139 mg/dL — ABNORMAL HIGH (ref 70–99)

## 2022-07-30 SURGERY — INSERTION, HYPOGLOSSAL NERVE STIMULATOR
Anesthesia: General | Laterality: Right

## 2022-07-30 MED ORDER — LACTATED RINGERS IV SOLN: INTRAVENOUS | Status: DC

## 2022-07-30 MED ORDER — AMISULPRIDE (ANTIEMETIC) 5 MG/2ML IV SOLN
10.0000 mg | Freq: Once | INTRAVENOUS | Status: AC | PRN
  Administered 2022-07-30: 10 mg via INTRAVENOUS

## 2022-07-30 MED ORDER — SUCCINYLCHOLINE CHLORIDE 200 MG/10ML IV SOSY
PREFILLED_SYRINGE | INTRAVENOUS | Status: DC | PRN
  Administered 2022-07-30: 100 mg via INTRAVENOUS

## 2022-07-30 MED ORDER — VANCOMYCIN HCL IN DEXTROSE 1-5 GM/200ML-% IV SOLN
INTRAVENOUS | Status: AC
  Filled 2022-07-30: qty 200

## 2022-07-30 MED ORDER — ONDANSETRON HCL 4 MG/2ML IJ SOLN
INTRAMUSCULAR | Status: DC | PRN
  Administered 2022-07-30: 4 mg via INTRAVENOUS

## 2022-07-30 MED ORDER — AMISULPRIDE (ANTIEMETIC) 5 MG/2ML IV SOLN
INTRAVENOUS | Status: AC
  Filled 2022-07-30: qty 4

## 2022-07-30 MED ORDER — OXYCODONE HCL 5 MG/5ML PO SOLN: 5.0000 mg | Freq: Once | ORAL | Status: DC | PRN

## 2022-07-30 MED ORDER — MIDAZOLAM HCL 2 MG/2ML IJ SOLN
INTRAMUSCULAR | Status: AC
  Filled 2022-07-30: qty 2

## 2022-07-30 MED ORDER — OXYCODONE HCL 5 MG PO TABS: 5.0000 mg | ORAL_TABLET | Freq: Once | ORAL | Status: DC | PRN

## 2022-07-30 MED ORDER — EPHEDRINE 5 MG/ML INJ
INTRAVENOUS | Status: AC
  Filled 2022-07-30: qty 15

## 2022-07-30 MED ORDER — ACETAMINOPHEN 10 MG/ML IV SOLN: 1000.0000 mg | Freq: Once | INTRAVENOUS | Status: DC | PRN

## 2022-07-30 MED ORDER — PHENYLEPHRINE 80 MCG/ML (10ML) SYRINGE FOR IV PUSH (FOR BLOOD PRESSURE SUPPORT)
PREFILLED_SYRINGE | INTRAVENOUS | Status: AC
  Filled 2022-07-30: qty 20

## 2022-07-30 MED ORDER — LIDOCAINE-EPINEPHRINE 1 %-1:100000 IJ SOLN
INTRAMUSCULAR | Status: DC | PRN
  Administered 2022-07-30: 4 mL

## 2022-07-30 MED ORDER — VANCOMYCIN HCL IN DEXTROSE 1-5 GM/200ML-% IV SOLN
1000.0000 mg | INTRAVENOUS | Status: AC
  Administered 2022-07-30 (×2): 1000 mg via INTRAVENOUS

## 2022-07-30 MED ORDER — HYDROCODONE-ACETAMINOPHEN 5-325 MG PO TABS
ORAL_TABLET | ORAL | 0 refills | Status: DC
  Filled 2022-07-30: qty 20, 5d supply, fill #0

## 2022-07-30 MED ORDER — ONDANSETRON HCL 4 MG/2ML IJ SOLN: 4.0000 mg | Freq: Once | INTRAMUSCULAR | Status: DC | PRN

## 2022-07-30 MED ORDER — 0.9 % SODIUM CHLORIDE (POUR BTL) OPTIME
TOPICAL | Status: DC | PRN
  Administered 2022-07-30: 1000 mL

## 2022-07-30 MED ORDER — DEXAMETHASONE SODIUM PHOSPHATE 4 MG/ML IJ SOLN
INTRAMUSCULAR | Status: DC | PRN
  Administered 2022-07-30: 5 mg via INTRAVENOUS

## 2022-07-30 MED ORDER — DEXMEDETOMIDINE HCL IN NACL 80 MCG/20ML IV SOLN
INTRAVENOUS | Status: DC | PRN
  Administered 2022-07-30: 8 ug via BUCCAL

## 2022-07-30 MED ORDER — PHENYLEPHRINE HCL-NACL 20-0.9 MG/250ML-% IV SOLN
INTRAVENOUS | Status: DC | PRN
  Administered 2022-07-30: 40 ug/min via INTRAVENOUS

## 2022-07-30 MED ORDER — HYDROMORPHONE HCL 1 MG/ML IJ SOLN: 0.2500 mg | INTRAMUSCULAR | Status: DC | PRN

## 2022-07-30 MED ORDER — FENTANYL CITRATE (PF) 100 MCG/2ML IJ SOLN
INTRAMUSCULAR | Status: DC | PRN
  Administered 2022-07-30: 50 ug via INTRAVENOUS

## 2022-07-30 MED ORDER — PROPOFOL 10 MG/ML IV BOLUS
INTRAVENOUS | Status: DC | PRN
  Administered 2022-07-30: 150 mg via INTRAVENOUS

## 2022-07-30 MED ORDER — MIDAZOLAM HCL 5 MG/5ML IJ SOLN
INTRAMUSCULAR | Status: DC | PRN
  Administered 2022-07-30: 1 mg via INTRAVENOUS

## 2022-07-30 MED ORDER — FENTANYL CITRATE (PF) 100 MCG/2ML IJ SOLN
INTRAMUSCULAR | Status: AC
  Filled 2022-07-30: qty 2

## 2022-07-30 MED ORDER — PROPOFOL 500 MG/50ML IV EMUL
INTRAVENOUS | Status: DC | PRN
  Administered 2022-07-30: 35 ug/kg/min via INTRAVENOUS

## 2022-07-30 MED ORDER — SCOPOLAMINE 1 MG/3DAYS TD PT72
1.0000 | MEDICATED_PATCH | TRANSDERMAL | Status: DC
  Administered 2022-07-30: 1.5 mg via TRANSDERMAL

## 2022-07-30 MED ORDER — LIDOCAINE 2% (20 MG/ML) 5 ML SYRINGE
INTRAMUSCULAR | Status: DC | PRN
  Administered 2022-07-30: 60 mg via INTRAVENOUS

## 2022-07-30 MED ORDER — LIDOCAINE-EPINEPHRINE 1 %-1:100000 IJ SOLN
INTRAMUSCULAR | Status: AC
  Filled 2022-07-30: qty 1

## 2022-07-30 MED ORDER — SCOPOLAMINE 1 MG/3DAYS TD PT72
MEDICATED_PATCH | TRANSDERMAL | Status: AC
  Filled 2022-07-30: qty 1

## 2022-07-30 SURGICAL SUPPLY — 70 items
ACC NRSTM 4 TRQ WRNCH STRL (MISCELLANEOUS)
ADH SKN CLS APL DERMABOND .7 (GAUZE/BANDAGES/DRESSINGS) ×1
ATTRACTOMAT 16X20 MAGNETIC DRP (DRAPES) IMPLANT
BLADE CLIPPER SURG (BLADE) IMPLANT
BLADE SURG 15 STRL LF DISP TIS (BLADE) ×1 IMPLANT
BLADE SURG 15 STRL SS (BLADE) ×2
CANISTER SUCT 1200ML W/VALVE (MISCELLANEOUS) ×1 IMPLANT
CORD BIPOLAR FORCEPS 12FT (ELECTRODE) ×1 IMPLANT
COVER PROBE CYLINDRICAL 5X96 (MISCELLANEOUS) ×1 IMPLANT
DERMABOND ADVANCED .7 DNX12 (GAUZE/BANDAGES/DRESSINGS) ×1 IMPLANT
DRAPE C-ARM 35X43 STRL (DRAPES) ×1 IMPLANT
DRAPE INCISE IOBAN 66X45 STRL (DRAPES) ×1 IMPLANT
DRAPE MICROSCOPE WILD 40.5X102 (DRAPES) ×1 IMPLANT
DRAPE UTILITY XL STRL (DRAPES) ×1 IMPLANT
DRSG TEGADERM 2-3/8X2-3/4 SM (GAUZE/BANDAGES/DRESSINGS) ×2 IMPLANT
DRSG TEGADERM 4X4.75 (GAUZE/BANDAGES/DRESSINGS) IMPLANT
ELECT COATED BLADE 2.86 ST (ELECTRODE) ×1 IMPLANT
ELECT EMG 18 NIMS (NEUROSURGERY SUPPLIES) ×1
ELECT REM PT RETURN 9FT ADLT (ELECTROSURGICAL) ×1
ELECTRODE EMG 18 NIMS (NEUROSURGERY SUPPLIES) ×1 IMPLANT
ELECTRODE REM PT RTRN 9FT ADLT (ELECTROSURGICAL) ×1 IMPLANT
FORCEPS BIPOLAR SPETZLER 8 1.0 (NEUROSURGERY SUPPLIES) ×1 IMPLANT
GAUZE 4X4 16PLY ~~LOC~~+RFID DBL (SPONGE) ×1 IMPLANT
GAUZE SPONGE 4X4 12PLY STRL (GAUZE/BANDAGES/DRESSINGS) ×1 IMPLANT
GENERATOR PULSE INSPIRE (Generator) ×1 IMPLANT
GENERATOR PULSE INSPIRE IV (Generator) ×1 IMPLANT
GLOVE BIO SURGEON STRL SZ 6.5 (GLOVE) IMPLANT
GLOVE BIOGEL M 7.0 STRL (GLOVE) ×1 IMPLANT
GOWN STRL REUS W/ TWL LRG LVL3 (GOWN DISPOSABLE) ×3 IMPLANT
GOWN STRL REUS W/TWL LRG LVL3 (GOWN DISPOSABLE) ×3
IV CATH 18G SAFETY (IV SOLUTION) ×1 IMPLANT
KIT NEURO ACCESSORY W/WRENCH (MISCELLANEOUS) IMPLANT
LEAD SENSING RESP INSPIRE (Lead) ×1 IMPLANT
LEAD SENSING RESP INSPIRE IV (Lead) ×1 IMPLANT
LEAD SLEEP STIM INSPIRE IV/V (Lead) ×1 IMPLANT
LEAD SLEEP STIMULATION INSPIRE (Lead) ×1 IMPLANT
LOOP VASCULAR MINI 18 RED (MISCELLANEOUS) ×1
LOOP VESSEL MAXI BLUE (MISCELLANEOUS) ×1 IMPLANT
MARKER SKIN DUAL TIP RULER LAB (MISCELLANEOUS) ×1 IMPLANT
NDL HYPO 25X1 1.5 SAFETY (NEEDLE) ×1 IMPLANT
NEEDLE HYPO 25X1 1.5 SAFETY (NEEDLE) ×1 IMPLANT
NS IRRIG 1000ML POUR BTL (IV SOLUTION) ×1 IMPLANT
PACK BASIN DAY SURGERY FS (CUSTOM PROCEDURE TRAY) ×1 IMPLANT
PACK ENT DAY SURGERY (CUSTOM PROCEDURE TRAY) ×1 IMPLANT
PASSER CATH 36 CODMAN DISP (NEUROSURGERY SUPPLIES) IMPLANT
PASSER CATH 38CM DISP (INSTRUMENTS) IMPLANT
PENCIL SMOKE EVACUATOR (MISCELLANEOUS) ×1 IMPLANT
PROBE NERVE STIMULATOR (NEUROSURGERY SUPPLIES) ×1 IMPLANT
REMOTE CONTROL SLEEP INSPIRE (MISCELLANEOUS) ×1 IMPLANT
SET WALTER ACTIVATION W/DRAPE (SET/KITS/TRAYS/PACK) ×1 IMPLANT
SLEEVE SCD COMPRESS KNEE MED (STOCKING) ×1 IMPLANT
SPIKE FLUID TRANSFER (MISCELLANEOUS) ×1 IMPLANT
SPONGE INTESTINAL PEANUT (DISPOSABLE) ×1 IMPLANT
STAPLER VISISTAT 35W (STAPLE) ×1 IMPLANT
SUT SILK 2 0 SH (SUTURE) ×1 IMPLANT
SUT SILK 3 0 RB1 (SUTURE) IMPLANT
SUT SILK 3 0 REEL (SUTURE) ×1 IMPLANT
SUT SILK 3 0 SH 30 (SUTURE) IMPLANT
SUT SILK 3-0 (SUTURE) ×1
SUT SILK 3-0 RB1 30XBRD (SUTURE) ×1
SUT VIC AB 3-0 SH 27 (SUTURE) ×1
SUT VIC AB 3-0 SH 27X BRD (SUTURE) ×1 IMPLANT
SUT VIC AB 4-0 PS2 27 (SUTURE) ×2 IMPLANT
SUT VIC AB 5-0 P-3 18X BRD (SUTURE) IMPLANT
SUT VIC AB 5-0 P3 18 (SUTURE)
SUTURE SILK 3-0 RB1 30XBRD (SUTURE) ×1 IMPLANT
SYR 10ML LL (SYRINGE) ×1 IMPLANT
SYR BULB EAR ULCER 3OZ GRN STR (SYRINGE) ×1 IMPLANT
TOWEL GREEN STERILE FF (TOWEL DISPOSABLE) ×2 IMPLANT
VASCULAR TIE MINI RED 18IN STL (MISCELLANEOUS) ×1 IMPLANT

## 2022-07-30 NOTE — Transfer of Care (Signed)
Immediate Anesthesia Transfer of Care Note  Patient: Brandon [name]  Procedure(s) Performed: IMPLANTATION OF HYPOGLOSSAL NERVE STIMULATOR (Right)  Patient Location: PACU  Anesthesia Type:General  Level of Consciousness: drowsy and patient cooperative  Airway & Oxygen Therapy: Patient Spontanous Breathing and Patient connected to face mask oxygen  Post-op Assessment: Report given to RN and Post -op Vital signs reviewed and stable  Post vital signs: Reviewed and stable  Last Vitals:  Vitals Value Taken Time  BP 122/65 07/30/22 1538  Temp    Pulse 83 07/30/22 1539  Resp 32 07/30/22 1539  SpO2 97 % 07/30/22 1539  Vitals shown include unvalidated device data.  Last Pain:  Vitals:   07/30/22 1023  TempSrc: Oral  PainSc: 0-No pain         Complications: No notable events documented.

## 2022-07-30 NOTE — Discharge Instructions (Signed)

## 2022-07-30 NOTE — H&P (Signed)
Brandon [name] is an 66 y.o. male.   Chief Complaint: OSA HPI: Hx of OSA unable to tolerate CPAP  Past Medical History:  Diagnosis Date   Anxiety    Arthritis    Basal cell carcinoma (BCC) in situ of skin    left neck   Chronic back pain    has spinal cord stimulator and wears buprenorphine patch   Colon polyps    Coronary artery disease    S/p DES to mRCA in 12/21 // S/p DES to LCx x 2 in 12/2020 // Diff dz in small to mod Dx (not a good target for PCI); OM1 100 CTO; severe dz in small OM2 and PDA >> Med Rx   Depression    Diabetes mellitus without complication (HCC)    type 2   Fibromyalgia    Gallstones    GERD (gastroesophageal reflux disease)    Hepatitis B    HLD (hyperlipidemia)    Hypertension    Osteoarthritis    Pneumonia    PONV (postoperative nausea and vomiting)    SBO (small bowel obstruction) (HCC)    Sleep apnea    does not use CPAP    Past Surgical History:  Procedure Laterality Date   CARDIAC CATHETERIZATION  01/17/2021   CATARACT EXTRACTION W/ INTRAOCULAR LENS  IMPLANT, BILATERAL     CHOLECYSTECTOMY     CHOLECYSTECTOMY, LAPAROSCOPIC     CORONARY STENT INTERVENTION N/A 08/15/2020   Procedure: CORONARY STENT INTERVENTION;  Surgeon: Corky Crafts, MD;  Location: [facility] INVASIVE CV LAB;  Service: Cardiovascular;  Laterality: N/A;   CORONARY STENT INTERVENTION N/A 01/17/2021   Procedure: CORONARY STENT INTERVENTION;  Surgeon: Kathleene Hazel, MD;  Location: [facility] CV [facility];  Service: Cardiovascular;  Laterality: N/A;   DRUG INDUCED ENDOSCOPY N/A 05/23/2022   Procedure: DRUG INDUCED SLEEP ENDOSCOPY;  Surgeon: Osborn Coho, MD;  Location: [facility] OR;  Service: ENT;  Laterality: N/A;   HERNIA REPAIR     INTRAVASCULAR ULTRASOUND/IVUS N/A 08/15/2020   Procedure: Intravascular Ultrasound/IVUS;  Surgeon: Corky Crafts, MD;  Location: [facility] INVASIVE CV LAB;  Service: Cardiovascular;  Laterality: N/A;   LEFT HEART CATH AND CORONARY ANGIOGRAPHY N/A  08/15/2020   Procedure: LEFT HEART CATH AND CORONARY ANGIOGRAPHY;  Surgeon: Corky Crafts, MD;  Location: [facility] INVASIVE CV LAB;  Service: Cardiovascular;  Laterality: N/A;   LEFT HEART CATH AND CORONARY ANGIOGRAPHY N/A 01/17/2021   Procedure: LEFT HEART CATH AND CORONARY ANGIOGRAPHY;  Surgeon: Kathleene Hazel, MD;  Location: [facility] INVASIVE CV LAB;  Service: Cardiovascular;  Laterality: N/A;   LUMBAR FUSION     L3-5 with rods   MICRODISCECTOMY LUMBAR  2006   SPINAL CORD STIMULATOR INSERTION     blood clot removed from spinal cord   SPINE SURGERY     blood clot removed from spinal cord   XI ROBOTIC ASSISTED INGUINAL HERNIA REPAIR WITH MESH     x 3 surgeries    Family History  Problem Relation Age of Onset   COPD Mother    Anxiety disorder Mother    Depression Mother    Colon cancer Mother    Cancer Father        mets, origin unknown   Anxiety disorder Father    Depression Father    Coronary artery disease Father    CAD Brother    Leukemia Maternal Grandmother    Multiple sclerosis Daughter    Diabetes Neg Hx    Social History:  reports  that he quit smoking about 16 years ago. His smoking use included cigarettes. He has a 30.00 pack-year smoking history. He has never used smokeless tobacco. He reports that he does not currently use alcohol. He reports that he does not currently use drugs.  Allergies:  Allergies  Allergen Reactions   Kiwi Extract Swelling    Mouth swelling.    Other Swelling and Other (See Comments)    EGGPLANT. Mouth swelling.   Penicillins Rash    Reaction: unknown   Ancef [Cefazolin] Rash   Latex Rash   Levaquin [Levofloxacin] Rash   Peach [Prunus Persica] Other (See Comments)    Burns mouth    Medications Prior to Admission  Medication Sig Dispense Refill   acetaminophen (TYLENOL) 500 MG tablet Take 1,000 mg by mouth See admin instructions. Take 2 tablets (1000 mg) by mouth scheduled in the morning & may take 2 tablets (1000 mg) by mouth  every 6 hours if needed for pain.     ARIPiprazole (ABILIFY) 10 MG tablet Take 1 tablet (10 mg total) by mouth every morning. 90 tablet 1   aspirin 81 MG EC tablet Take 81 mg by mouth every morning.     atorvastatin (LIPITOR) 80 MG tablet Take 1 tablet (80 mg total) by mouth daily. 90 tablet 2   b complex vitamins capsule Take 1 capsule by mouth in the morning.     buprenorphine (BUTRANS) 15 MCG/HR Place 1 patch onto the skin every Friday. 4 patch 2   buPROPion (WELLBUTRIN XL) 300 MG 24 hr tablet Take 1 tablet (300 mg total) by mouth daily. 90 tablet 0   clopidogrel (PLAVIX) 75 MG tablet TAKE 1 TABLET (75 MG TOTAL) BY MOUTH DAILY WITH BREAKFAST. (Patient taking differently: Take 75 mg by mouth daily with breakfast.) 90 tablet 3   Continuous Blood Gluc Sensor (DEXCOM G6 SENSOR) MISC Use as directed. Change sensor every 10 days 9 each 3   docusate sodium (COLACE) 100 MG capsule Take 200 mg by mouth in the morning.     empagliflozin (JARDIANCE) 25 MG TABS tablet Take 1 tablet (25 mg total) by mouth daily. 90 tablet 3   insulin lispro (HUMALOG) 100 UNIT/ML injection For use in pump, total of 60 units per day (Patient taking differently: Inject 60 Units into the skin See admin instructions. For use in pump, total of 60 units per day) 60 mL 3   Iron, Ferrous Sulfate, 325 (65 Fe) MG TABS Take 1 tablet (325 mg) by mouth daily. 100 tablet 1   isosorbide mononitrate (IMDUR) 30 MG 24 hr tablet Take 1 tablet (30 mg total) by mouth daily. 90 tablet 2   lidocaine (LIDODERM) 5 % Apply 1 - 3 patches on to the skin as directed. 12 hours on and 12 hours off. 90 patch 2   magnesium oxide (MAG-OX) 400 (240 Mg) MG tablet Take 800 mg by mouth in the morning.     metoCLOPramide (REGLAN) 10 MG tablet Take 10 mg by mouth daily as needed for nausea or vomiting (upset stomach).     metoprolol succinate (TOPROL XL) 25 MG 24 hr tablet Take 1 tablet (25 mg total) by mouth daily. Please keep upcoming appointment for future  refills. Thank you 90 tablet 1   Na Sulfate-K Sulfate-Mg Sulf 17.5-3.13-1.6 GM/177ML SOLN Take by mouth as directed 354 mL 0   pantoprazole (PROTONIX) 40 MG tablet Take 1 tablet (40 mg total) by mouth daily. 90 tablet 0   Semaglutide, 2 MG/DOSE, (  OZEMPIC, 2 MG/DOSE,) 8 MG/3ML SOPN Inject 2 mg into the skin once a week. (Patient taking differently: Inject 2 mg into the skin every Wednesday.) 9 mL 1   tamsulosin (FLOMAX) 0.4 MG CAPS capsule Take 1 capsule (0.4 mg total) by mouth daily. 90 capsule 1   testosterone (ANDROGEL) 50 MG/5GM (1%) GEL Place 5 g (1 packet) onto the shoulders and upper arms daily as directed. 150 g 1   tiZANidine (ZANAFLEX) 4 MG tablet Take 1-1&1/2 tablets (4-6 mg total) by mouth every 8 (eight) hours as needed for muscle spasms. (Patient taking differently: Take 8 mg by mouth every 8 (eight) hours as needed for muscle spasms.) 90 tablet 3   GVOKE HYPOPEN 1-PACK 1 MG/0.2ML SOAJ Use as needed for hypoglycemia (Patient taking differently: 1 mg once. Use as needed for hypoglycemia) 0.2 mL PRN   nitroGLYCERIN (NITROSTAT) 0.4 MG SL tablet Place 0.4 mg under the tongue every 5 (five) minutes x 3 doses as needed for chest pain.     ondansetron (ZOFRAN-ODT) 4 MG disintegrating tablet Take 1 tablet (4 mg total) by mouth every 8 (eight) hours as needed for nausea or vomiting. 20 tablet 0    Results for orders placed or performed during the hospital encounter of 07/30/22 (from the past 48 hour(s))  Glucose, capillary     Status: Abnormal   Collection Time: 07/30/22 10:19 AM  Result Value Ref Range   Glucose-Capillary 150 (H) 70 - 99 mg/dL    Comment: Glucose reference range applies only to samples taken after fasting for at least 8 hours.   No results found.  Review of Systems  Respiratory:  Positive for apnea.     Blood pressure 103/61, pulse 80, temperature 97.6 F (36.4 C), temperature source Oral, resp. rate 18, height 5\' 8"  (1.727 m), weight 87.3 kg, SpO2 99 %. Physical  Exam Constitutional:      Appearance: Normal appearance.  Cardiovascular:     Rate and Rhythm: Normal rate.     Pulses: Normal pulses.  Pulmonary:     Effort: Pulmonary effort is normal.  Musculoskeletal:     Cervical back: Normal range of motion.  Neurological:     Mental Status: He is alert.      Assessment/Plan Adm for OP Inspire Implant  , MD 07/30/2022, 12:11 PM

## 2022-07-30 NOTE — Anesthesia Procedure Notes (Signed)
Procedure Name: Intubation Date/Time: 07/30/2022 12:25 PM  Performed by: Jamison Soward, Ernesta Amble, CRNAPre-anesthesia Checklist: Patient identified, Emergency Drugs available, Suction available and Patient being monitored Patient Re-evaluated:Patient Re-evaluated prior to induction Oxygen Delivery Method: Circle system utilized Preoxygenation: Pre-oxygenation with 100% oxygen Induction Type: IV induction Ventilation: Mask ventilation without difficulty Laryngoscope Size: Mac and 3 Grade View: Grade I Tube type: Oral Tube size: 7.5 mm Number of attempts: 1 Airway Equipment and Method: Stylet and Oral airway Placement Confirmation: ETT inserted through vocal cords under direct vision, positive ETCO2 and breath sounds checked- equal and bilateral Secured at: 22 cm Tube secured with: Tape Dental Injury: Teeth and Oropharynx as per pre-operative assessment

## 2022-07-30 NOTE — Op Note (Signed)
Operative Note: INSPIRE IMPLANT  Patient: Brandon [name]  Medical record number: [id number]  Date:07/30/2022  Pre-operative Indications: Moderate/severe obstructive sleep apnea with positive airway pressure intolerance  Postoperative Indications: Same  Surgical Procedure:  1.  12th cranial nerve (hypoglossal) stimulation implant    2.  Placement of chest wall respiratory sensor    3.  Electronic analysis of implanted neurostimulator with pulse generator system  Anesthesia: GET  Surgeon: Barbee Cough, M.D.  Assist: Surgical tech  BMI: 29.3 kg/m  Signs and symptoms of the patient's obstructive sleep apnea include poor sleep quality, daytime fatigue and hypersomnolence and inability to tolerate CPAP.  Indications for hypoglossal nerve stimulator are significant obstructive sleep apnea with inability to tolerate medical treatment.  Complications: None  EBL: 50 cc   Brief History: The patient is a 66 y.o. male with a history of moderate/severe obstructive sleep apnea.  The patient has undergone work-up including sleep study and trial of CPAP which they did not tolerate.  Patient is unable to use long-term CPAP for control of obstructive sleep apnea.  Patient underwent DISE which showed anterior to posterior airway obstruction, considered a good candidate for Inspire Implant. Given the patient's history and findings, I recommended Inspire Implant under general anesthesia, risks and benefits were discussed in detail with the patient and family. They understand and agree with our plan for surgery which is scheduled at [facility] on an elective basis.  Surgical Procedure: The patient is brought to the operating room on 07/30/2022 and placed in supine position on the operating table. General endotracheal anesthesia was established without difficulty. When the patient was adequately anesthetized, surgical timeout was performed and correct identification of the patient and the surgical procedure.  The patient was injected with 4 cc of 1% lidocaine 1:100,000 dilution epinephrine in subcutaneous fashion in the proposed skin incisions.  The patient was positioned and prepped and draped in sterile fashion.  The NIMS monitoring system was positioned and electrodes were placed in the anterior floor of mouth and tongue to assess the genioglossus and styloglossus muscle groups.  Nerve monitoring was used throughout the surgical procedure.  The procedure was begun by creating a modified right submandibular incision in the upper anterior lateral neck ~2 cm below the mandible and in a natural skin crease.  The incision was carried through the skin and underlying subcutaneous tissue to the level of the platysma.  Platysma muscle was divided and subplatysmal flaps were elevated superiorly and inferiorly.  The submandibular space was entered and the submandibular gland was identified and retracted posteriorly.  The digastric tendon was identified and dissection was carried out anterior and posterior along the tendon and muscle bellies.  The mylohyoid muscle was identified and retracted anteriorly and the hypoglossal nerve was carefully dissected across the anterior floor of the submandibular space.  Lateral branches to the retrusor muscles were identified and tested intraoperatively using the NIMS stimulator.  Stimulation electrode cuff for the hypoglossal nerve stimulator was placed distal to these branches on the medial hypoglossal nerve branch innervating the genioglossus muscle.  Diagnostic evaluation confirmed activation of the genioglossus muscle, resulting in genioglossal activation and tongue protrusion which was visually confirmed in the operating room.  The stimulation electrode was  sutured to the digastric tendon with interrupted 4-0 silk sutures.  A second second incision was created in the anterior upper right chest wall overlying the second rib interspace.  Incision was carried through the skin and  underlying subcutaneous tissue to the level of the  pectoralis major muscle.  The IPG pocket was created deep to the subcutaneous layer and superficial to the pectoralis muscle.  Placement of the stimulation lead was then undertaken by gentle dissection through the pectoralis major muscle parallel to the muscle fibers overlying the second rib interspace.  The subpectoral fat plane was then divided bluntly and the medial border of the external intercostal muscle was identified.  1 cm posterior to the medial border, dissection was carried through the external intercostal and a plane was developed in the second rib interspace.  The sensor lead was then tunneled into the second rib interspace and sutured with 3-0 silk suture to secure it in proper orientation with the sensing probe facing the pleural space.  The pectoralis major muscle dissection was then brought back into its normal anatomic position and the sense lead was brought out into the subcutaneous pocket.  The lead for the stimulating electrode was then tunneled in a subplatysmal fashion from the submandibular incision to the anterior chest wall incision.  The stimulating electrode and respiration sensing lead were connected to the implantable pulse generator.  Diagnostic evaluation was run confirming respiratory signal and good tongue protrusion on stimulation.  The implantable pulse generator was then placed in the subclavicular pocket and sutured to the pectoralis fascia with 2-0 silk sutures sutures.  The incisions were thoroughly irrigated with bacitracin saline irrigation.  The incisions were then closed in multiple layers with 4-0 and 5-0 Vicryl interrupted sutures in the deep and superficial subcutaneous levels at each incision site.  Dermabond surgical glue was used for skin closure.  The patient's incisions were dressed with rolled gauze and Hypafix tape for pressure dressing.    An orogastric tube was passed and stomach contents were aspirated.  Patient was awakened from anesthetic and transferred from the operating room to the recovery room in stable condition. There were no complications and blood loss was minimal.  X-rays were obtained in the recovery room to assess the proper location of the pulse generator, sensor lead and stimulation lead.   Barbee Cough, M.D. [facility] 07/30/2022

## 2022-07-30 NOTE — Anesthesia Preprocedure Evaluation (Addendum)
Anesthesia Evaluation  Patient identified by MRN, date of birth, ID band Patient awake    Reviewed: Allergy & Precautions, NPO status , Patient's Chart, lab work & pertinent test results  History of Anesthesia Complications (+) PONV and history of anesthetic complications  Airway Mallampati: II  TM Distance: >3 FB Neck ROM: Full    Dental no notable dental hx. (+) Upper Dentures, Lower Dentures, Dental Advisory Given   Pulmonary sleep apnea , former smoker   Pulmonary exam normal breath sounds clear to auscultation       Cardiovascular hypertension, Pt. on home beta blockers + CAD and + Cardiac Stents (07/2020, 12/2020)  Normal cardiovascular exam Rhythm:Regular Rate:Normal  Echo 11/01/21: EF 60-65%, no RWMA, mod LVH, g1dd, normal RVSF, valves normal    Neuro/Psych   Anxiety Depression    S/p spinal cord stimulator    GI/Hepatic ,GERD  ,,  Endo/Other  diabetes, Type 2, Insulin Dependent    Renal/GU negative Renal ROSLab Results      Component                Value               Date                      CREATININE               0.70                07/25/2022                BUN                      8                   07/25/2022                NA                       141                 07/25/2022                K                        3.9                 07/25/2022                CL                       105                 07/25/2022                CO2                      27                  07/25/2022             negative genitourinary   Musculoskeletal  (+) Arthritis ,  Fibromyalgia -  Abdominal   Peds  Hematology negative hematology ROS (+)   Anesthesia Other Findings All: Pcn, Latex, Levaquin, cefazolin  Reproductive/Obstetrics  Anesthesia Physical Anesthesia Plan  ASA: 3  Anesthesia Plan: General   Post-op Pain Management: Tylenol PO (pre-op)* and  Toradol IV (intra-op)*   Induction: Intravenous  PONV Risk Score and Plan: 4 or greater and Ondansetron, Dexamethasone, Treatment may vary due to age or medical condition, Midazolam and Scopolamine patch - Pre-op  Airway Management Planned: Oral ETT  Additional Equipment: None  Intra-op Plan:   Post-operative Plan: Extubation in OR  Informed Consent: I have reviewed the patients History and Physical, chart, labs and discussed the procedure including the risks, benefits and alternatives for the proposed anesthesia with the patient or authorized representative who has indicated his/her understanding and acceptance.     Dental advisory given  Plan Discussed with: CRNA, Anesthesiologist and Surgeon  Anesthesia Plan Comments: (Pt on Ozempic last taken 2 weeks ago)       Anesthesia Quick Evaluation

## 2022-07-31 ENCOUNTER — Encounter (HOSPITAL_BASED_OUTPATIENT_CLINIC_OR_DEPARTMENT_OTHER): Payer: Self-pay | Admitting: Otolaryngology

## 2022-07-31 NOTE — Anesthesia Postprocedure Evaluation (Signed)
Anesthesia Post Note  Patient: Brandon [name]  Procedure(s) Performed: IMPLANTATION OF HYPOGLOSSAL NERVE STIMULATOR (Right)     Patient location during evaluation: PACU Anesthesia Type: General Level of consciousness: awake and alert Pain management: pain level controlled Vital Signs Assessment: post-procedure vital signs reviewed and stable Respiratory status: spontaneous breathing, nonlabored ventilation, respiratory function stable and patient connected to nasal cannula oxygen Cardiovascular status: blood pressure returned to baseline and stable Postop Assessment: no apparent nausea or vomiting Anesthetic complications: no  No notable events documented.  Last Vitals:  Vitals:   07/30/22 1630 07/30/22 1705  BP: (!) 104/59 120/66  Pulse: 83 63  Resp: 19 16  Temp:  (!) 36.4 C  SpO2: 92% 94%    Last Pain:  Vitals:   07/30/22 1705  TempSrc:   PainSc: 0-No pain                 Trevor Iha

## 2022-08-12 ENCOUNTER — Ambulatory Visit (INDEPENDENT_AMBULATORY_CARE_PROVIDER_SITE_OTHER): Payer: No Typology Code available for payment source | Admitting: Family Medicine

## 2022-08-12 DIAGNOSIS — Z23 Encounter for immunization: Secondary | ICD-10-CM | POA: Diagnosis not present

## 2022-08-14 IMAGING — DX DG CHEST 2V
2 series · 2 of 2 positions shown · non-contrast
Comparison: 08/11/2020

CLINICAL DATA: Chest pain

EXAM:
CHEST - 2 VIEW

[chest lat]
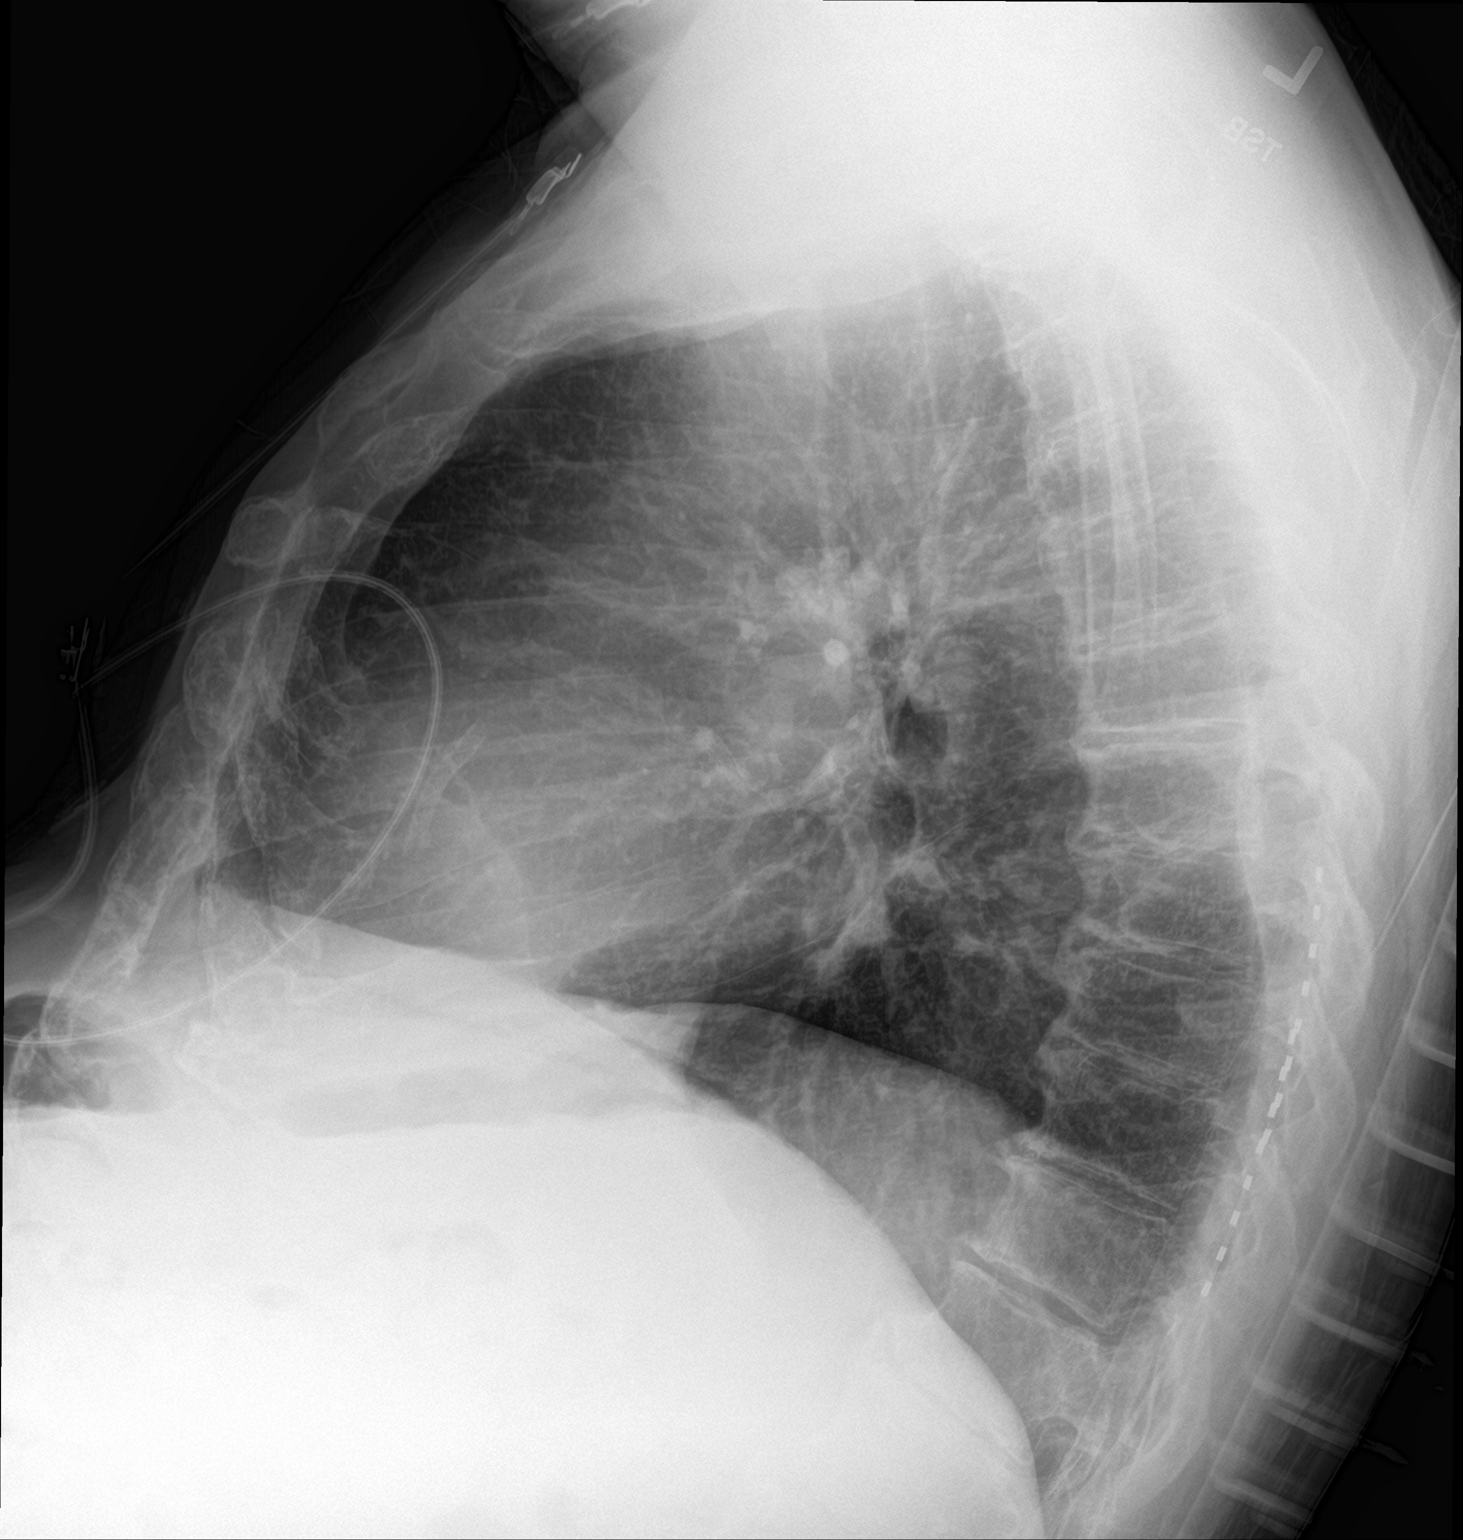

[chest ap strecther]
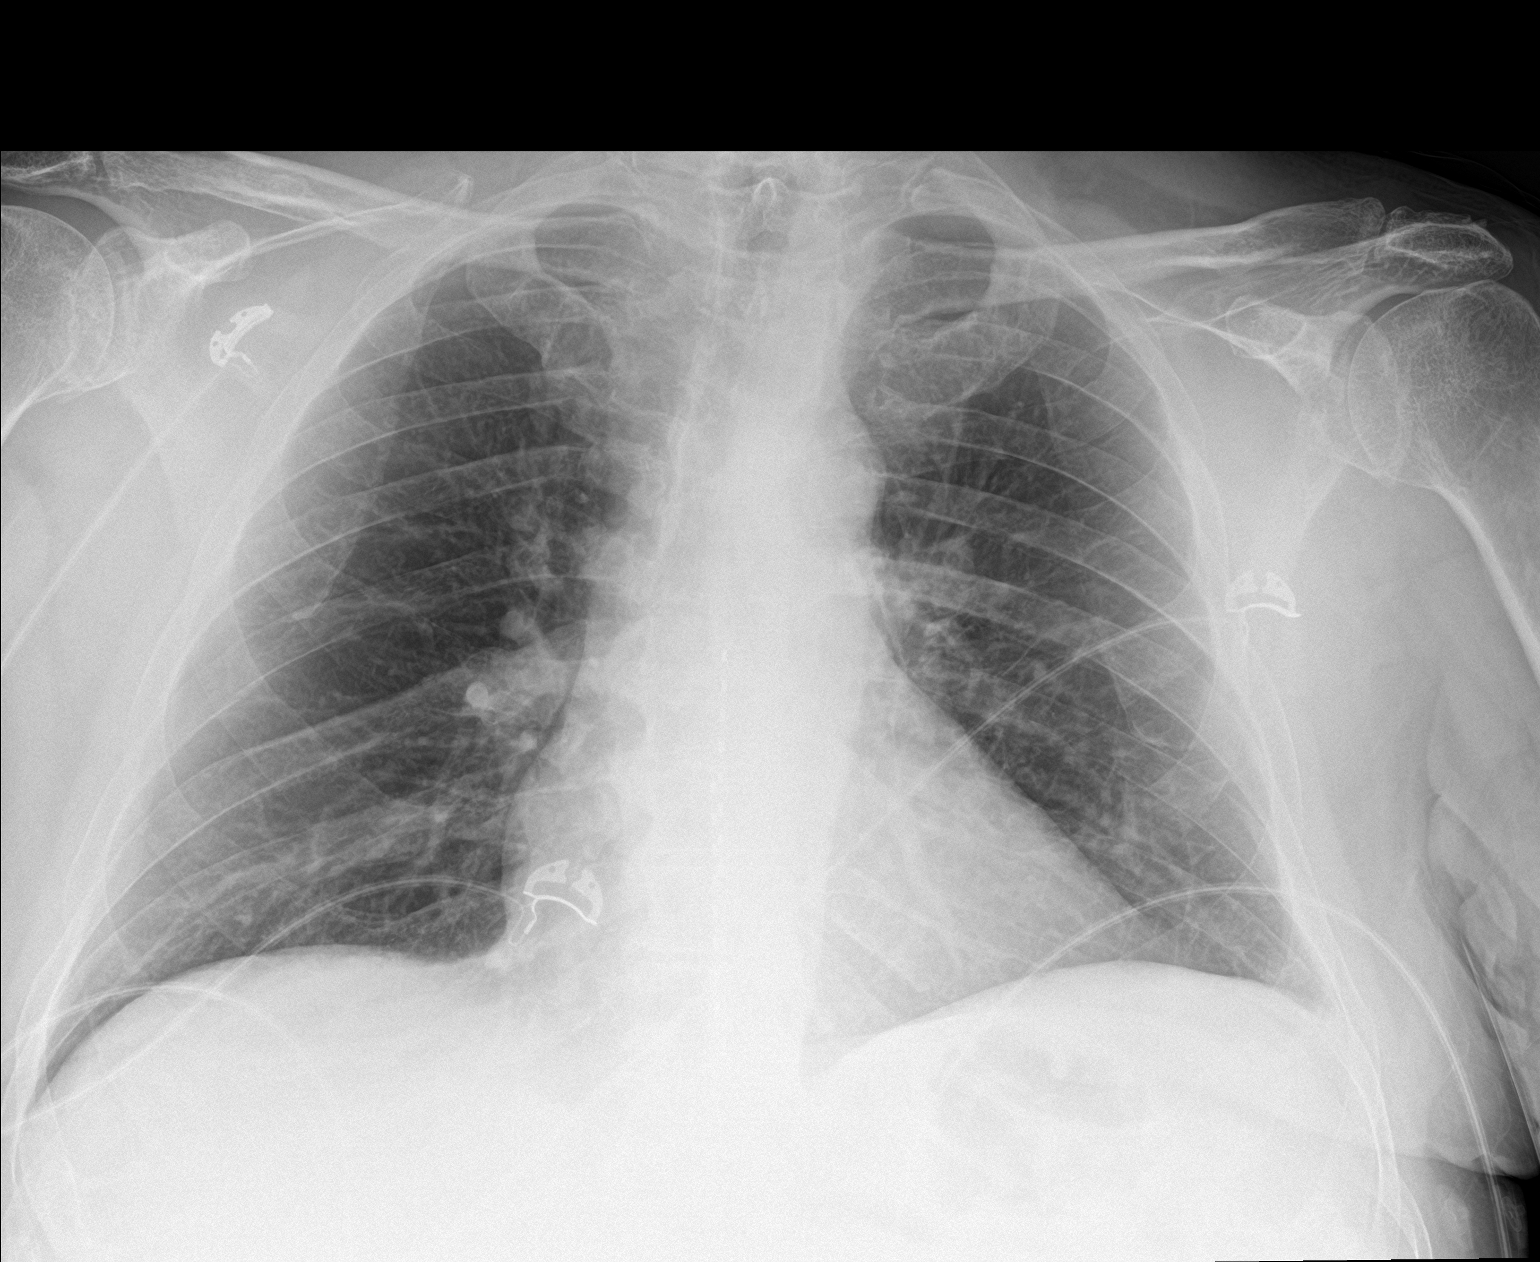

[2 of 2 positions shown; findings below may reference images not displayed]

FINDINGS: The heart size and mediastinal contours are within normal limits.
Both lungs are clear. The visualized skeletal structures are
unremarkable. There is pain control lead in thoracic spinal canal.
IMPRESSION: No active cardiopulmonary disease.

## 2022-08-14 NOTE — Progress Notes (Deleted)
Cardiology Office Note:    Date:  08/14/2022   ID:  Brandon Hutchingerry T Mitter, DOB 1955/10/28, MRN [id number]  PCP:  Everrett CoombeMatthews, Cody, DO   [facility]  Cardiologist:  Meriam SpragueHeather E , MD  Advanced Practice Provider:  No care team member to display Electrophysiologist:  None   :[id number]}   Referring MD: Everrett CoombeMatthews, Cody, DO     History of Present Illness:    Brandon [name] is a 66 y.o. male with a hx of HTN, HLD, DMII, and CAD s/p recent PCI to RCA who presents to clinic for follow-up.  Patient was admitted to the hospital on 08/11/2020 with chest pain.  CTA of the chest showed no evidence of aortic dissection or aneurysm.  Respiratory panel negative for influenza and Covid.  D-dimer negative.  Troponin negative x2.  TTE 08/24/2020 showed EF 60 to 65%, no significant valve issue.  Given the atypical nature of his symptoms, he underwent coronary CT on 08/14/2020 which showed coronary calcium score of 1201 which placed the patient in 95th percentile for age and sex matched control, diffuse CAD with beadlike diabetic pattern with suspicion for severe stenosis in the mid RCA, ostial D2 and proximal left circumflex artery.  He subsequently underwent cardiac catheterization on 08/15/2020 which showed 90% mid RCA lesion, 75% proximal left circumflex artery, 75% OM 2, 100% OM1 with faint left to left and right-to-left collaterals, 70% RPDA, 75% D2, 30% distal LAD lesion.  The 90% mid RCA lesion was treated successfully with a 4.0 x 38 mm resolute DES.  The small disease in RPDA, OM1, OM2 at the diagonal vessel was managed medically. Postprocedure, he continued to have chest discomfort.  A bedside echocardiogram obtained on 08/15/2020 did not reveal any pericardial effusion, there was no change in LV function.  He was treated with colchicine 0.6 mg twice a day, colchicine was subsequently discontinued due to lack of recurrent symptoms.  Hemoglobin A1c obtained during this admission was 10,  he has been advised to follow-up with his primary care provider.  During visit on 12/04/20, the patient was having significant DOE in the setting of known significant Lcx disease.He failed antianginal medication up-titration and therefore went for cath on 01/17/21 which revealed severe prox Lcx stenosis, severe mid Lcx stenosis and chronic occlusion of OM1. Proximal RCA stent was patent. Severe disease in PDA (small caliber) unchanged from last cath. He received PCI with DES to prox Lcx and mid Lcx.   He presented to the ED 10/22/21 for fatigue, confusion and malaise in the setting of low blood pressures. Work-up there with nonischemic ECG, normal trop, normal d-dimer, reassuring BNP. CXR without infiltrate. He clinically improved and was discharged home.   Was admitted 12/2021 for partial SBO that was managed conservatively.  Was last seen in clinic on 01/2022 where he was doing well from a CV standpoint. Remained active.  Today ***    Past Medical History:  Diagnosis Date   Anxiety    Arthritis    Basal cell carcinoma (BCC) in situ of skin    left neck   Chronic back pain    has spinal cord stimulator and wears buprenorphine patch   Colon polyps    Coronary artery disease    S/p DES to mRCA in 12/21 // S/p DES to LCx x 2 in 12/2020 // Diff dz in small to mod Dx (not a good target for PCI); OM1 100 CTO; severe dz in small OM2 and PDA >> Med  Rx   Depression    Diabetes mellitus without complication (HCC)    type 2   Fibromyalgia    Gallstones    GERD (gastroesophageal reflux disease)    Hepatitis B    HLD (hyperlipidemia)    Hypertension    Osteoarthritis    Pneumonia    PONV (postoperative nausea and vomiting)    SBO (small bowel obstruction) (HCC)    Sleep apnea    does not use CPAP    Past Surgical History:  Procedure Laterality Date   CARDIAC CATHETERIZATION  01/17/2021   CATARACT EXTRACTION W/ INTRAOCULAR LENS  IMPLANT, BILATERAL     CHOLECYSTECTOMY      CHOLECYSTECTOMY, LAPAROSCOPIC     CORONARY STENT INTERVENTION N/A 08/15/2020   Procedure: CORONARY STENT INTERVENTION;  Surgeon: Corky Crafts, MD;  Location: [facility];  Service: Cardiovascular;  Laterality: N/A;   CORONARY STENT INTERVENTION N/A 01/17/2021   Procedure: CORONARY STENT INTERVENTION;  Surgeon: Kathleene Hazel, MD;  Location: [facility];  Service: Cardiovascular;  Laterality: N/A;   DRUG INDUCED ENDOSCOPY N/A 05/23/2022   Procedure: DRUG INDUCED SLEEP ENDOSCOPY;  Surgeon: Osborn Coho, MD;  Location: [facility];  Service: ENT;  Laterality: N/A;   HERNIA REPAIR     IMPLANTATION OF HYPOGLOSSAL NERVE STIMULATOR Right 07/30/2022   Procedure: IMPLANTATION OF HYPOGLOSSAL NERVE STIMULATOR;  Surgeon: Osborn Coho, MD;  Location: [facility];  Service: ENT;  Laterality: Right;   INTRAVASCULAR ULTRASOUND/IVUS N/A 08/15/2020   Procedure: Intravascular Ultrasound/IVUS;  Surgeon: Corky Crafts, MD;  Location: [facility] INVAS[facility] CV LAB;  Service: Cardiovascular;  Laterality: N/A;   LEFT HEART CATH AND CORONARY ANGIOGRAPHY N/A 08/15/2020   Procedure: LEFT HEART CATH AND CORONARY ANGIOGRAPHY;  Surgeon: Corky Crafts, MD;  Location: [facility] INVASIVE CV LAB;  Service: Cardiovascular;  Laterality: N/A;   LEFT HEART CATH AND CORONARY ANGIOGRAPHY N/A 01/17/2021   Procedure: LEFT HEART CATH AND CORONARY ANGIOGRAPHY;  Surgeon: Kathleene Hazel, MD;  Location: [facility];  Service: Cardiovascular;  Laterality: N/A;   LUMBAR FUSION     L3-5 with rods   MICRODISCECTOMY LUMBAR  2006   SPINAL CORD STIMULATOR INSERTION     blood clot removed from spinal cord   SPINE SURGERY     blood clot removed from spinal cord   XI ROBOTIC ASSISTED INGUINAL HERNIA REPAIR WITH MESH     x 3 surgeries    Current Medications: No outpatient medications have been marked as taking for the 08/21/22 encounter (Appointment) with Meriam Sprague, MD.      Allergies:   Kiwi extract, Other, Penicillins, Ancef [cefazolin], Latex, Levaquin [levofloxacin], and Peach [prunus persica]   Social History   Socioeconomic History   Marital status: Married    Spouse name: Not on file   Number of children: 4   Years of education: 16   Highest education level: Not on file  Occupational History   Occupation: disabled/ respiratory therapist  Tobacco Use   Smoking status: Former    Packs/day: 1.00    Years: 30.00    Total pack years: 30.00    Types: Cigarettes    Quit date: 2007    Years since quitting: 16.9   Smokeless tobacco: Never  Vaping Use   Vaping Use: Never used  Substance and Sexual Activity   Alcohol use: Not Currently   Drug use: Not Currently   Sexual activity: Yes  Other Topics Concern   Not on  file  Social History Narrative   Not on file   Social Determinants of Health   Financial Resource Strain: Not on file  Food Insecurity: No Food Insecurity (07/13/2022)   Hunger Vital Sign    Worried About Running Out of Food in the Last Year: Never true    Ran Out of Food in the Last Year: Never true  Transportation Needs: No Transportation Needs (07/13/2022)   PRAPARE - Administrator, Civil Service (Medical): No    Lack of Transportation (Non-Medical): No  Physical Activity: Not on file  Stress: Not on file  Social Connections: Not on file     Family History: The patient's family history includes Anxiety disorder in his father and mother; CAD in his brother; COPD in his mother; Cancer in his father; Colon cancer in his mother; Coronary artery disease in his father; Depression in his father and mother; Leukemia in his maternal grandmother; Multiple sclerosis in his daughter. There is no history of Diabetes.  ROS:   Please see the history of present illness. Review of Systems  Constitutional:  Negative for chills and fever.  HENT:  Negative for congestion and sore throat.   Eyes:  Negative for blurred vision  and discharge.  Respiratory:  Negative for cough and shortness of breath.   Cardiovascular:  Negative for chest pain, palpitations, orthopnea, claudication, leg swelling and PND.  Gastrointestinal:  Negative for nausea and vomiting.  Genitourinary:  Negative for dysuria and urgency.  Musculoskeletal:  Positive for back pain. Negative for myalgias.  Skin:  Negative for itching and rash.  Neurological:  Negative for dizziness and headaches.  Endo/Heme/Allergies:  Negative for polydipsia. Does not bruise/bleed easily.  Psychiatric/Behavioral:  The patient is not nervous/anxious and does not have insomnia.    All other systems reviewed and are negative.  EKGs/Labs/Other Studies Reviewed:    The following studies were reviewed today: Cath 01/17/21: Left heart cath 01/17/21: 2nd Diag-2 lesion is 75% stenosed. 2nd Diag-1 lesion is 75% stenosed. Dist LAD lesion is 50% stenosed. 2nd Mrg lesion is 75% stenosed. 1st Mrg lesion is 100% stenosed. RPDA lesion is 70% stenosed. Previously placed Mid RCA stent (unknown type) is widely patent. Dist RCA lesion is 50% stenosed. Ost RCA to Prox RCA lesion is 30% stenosed. Mid Cx lesion is 90% stenosed. Ost Cx to Prox Cx lesion is 75% stenosed. A drug-eluting stent was successfully placed using a STENT RESOLUTE ONYX 3.5X15. Post intervention, there is a 0% residual stenosis. A drug-eluting stent was successfully placed using a STENT RESOLUTE ONYX 2.25X8. Post intervention, there is a 0% residual stenosis.   1. Non-obstructive disease in the LAD. Diffuse disease in the small to moderate caliber Diagonal branch, unchanged from last cath and not a good target for PCI.  2. Severe proximal Circumflex stenosis. Severe mid Circumflex stenosis. Chronic occlusion OM1. Severe diffuse disease in the small caliber second OM branch.  4. Patent proximal to mid RCA stent. Severe disease in the small caliber PDA, unchanged from last cath.  5. Successful PTCA/DES x 1  proximal Circumflex 6. Successful PTCA/DES x 1 mid Circumflex   Echo 08/13/2020  1. Left ventricular ejection fraction, by estimation, is 60 to 65%. The  left ventricle has normal function. The left ventricle has no regional  wall motion abnormalities. There is mild left ventricular hypertrophy.  Left ventricular diastolic parameters  are consistent with Grade I diastolic dysfunction (impaired relaxation).   2. Right ventricular systolic function is normal. The right  ventricular  size is normal.   3. The mitral valve is normal in structure. No evidence of mitral valve  regurgitation. No evidence of mitral stenosis.   4. The aortic valve is tricuspid. Aortic valve regurgitation is not  visualized. No aortic stenosis is present.   5. The inferior vena cava is normal in size with greater than 50%  respiratory variability, suggesting right atrial pressure of 3 mmHg.  Cath 08/15/20: Mid RCA lesion is 90% stenosed. Prox Cx lesion is 75% stenosed. 2nd Mrg lesion is 75% stenosed. 1st Mrg lesion is 100% stenosed. Faint right to left and left to left collaterals. RPDA lesion is 70% stenosed. 2nd Diag-1 lesion is 75% stenosed. 2nd Diag-2 lesion is 75% stenosed. Dist LAD lesion is 50% stenosed. A drug-eluting stent was successfully placed using a STENT RESOLUTE ONYX 4.0X38, optimized with IVUS. Post intervention, there is a 0% residual stenosis. LV end diastolic pressure is normal. There is no aortic valve stenosis. 75 cm long sheath needed for support due to tortuosity in the right subclavian to engage the RCA. Left coronary could be engaged easily with short sheath.   Diffuse multivessel disease, but no LAD involvement.     RCA successfully treated.  Small vessel disease noted in the RPDA, OM1 and OM2, second diagonal and apical LAD.  Proximal circumflex would be a potential target for PCI if he had more symptoms.   CTA 08/14/20: 1. Left Main: 0.98. 2. LAD: Proximal: 0.97, distal:  0.87. 3. D1: Proximal: 0.75. 4. LCX: Proximal; 0.94, mid: Occluded. 5. RCA: Proximal: 0.98, distal: 0.77.   IMPRESSION: 1. CT FFR analysis showed significant stenoses in proximal RCA, ostial portion of a large D1 and occluded proximal portion of LCX artery. Left cardiac catheterization is recommended.   FINDINGS: A 100 kV prospective scan was triggered in the descending thoracic aorta at 111 HU's. Axial non-contrast 3 mm slices were carried out through the heart. The data set was analyzed on a dedicated work station and scored using the Agatson method. Gantry rotation speed was 250 msecs and collimation was .6 mm. 100 mg of PO Metoprolol and 0.8 mg of sl NTG were given. The 3D data set was reconstructed in 5% intervals of the 67-82 % of the R-R cycle. Diastolic phases were analyzed on a dedicated work station using MPR, MIP and VRT modes. The patient received 80 cc of contrast.   Aorta: Normal size. Mild diffuse atherosclerotic plaque and calcifications. No dissection.   Aortic Valve:  Trileaflet.  No calcifications.   Coronary Arteries:  Normal coronary origin.  Right dominance.   RCA is a large dominant artery that gives rise to PDA and PLA. There is mild calcified plaque in the proximal RCA with stenosis 25-49%. Mid RCA has severe diffuse predominantly calcified plaque with a focal stenosis suspicious for > 70%. Distal RCA has moderate calcified plaque prior to bifurcation into PLA and PDA with stenosis 50-69%.   PLA is small with diffuse moderate plaque.   PDA is a medium caliber artery with diffuse moderate plaque in a bead like pattern.   Left main is a large artery that gives rise to LAD and LCX arteries. Left main is a long artery with minimal calcified plaque and stenosis 0-25%.   LAD is a large vessel that gives rise to two diagonal arteries. There is mild diffuse calcified plaque with focal stenoses in the proximal and mid LAD of 25-49%.   D1 is very  small.   D2 is a  medium caliber artery (2.1 mm) that has severe mixed ostial plaque with lipid rich core and is suspicious for stenosis > 70%.   LCX is a large caliber non-dominant artery that gives rise to two OM branches. Proximal LCX artery has severe non-calcified plaque suspicious for stenosis > 70%, this is followed by a moderate diffuse calcified plaque with stenosis 50-69%. Mid and distal LCX artery has small lumen and mild diffuse plaque.   OM1 is very small and has severe diffuse plaque.   OM2 is a medium caliber artery with diffuse moderate plaque in a bead like pattern.   Other findings:   Normal pulmonary vein drainage into the left atrium.   Normal left atrial appendage without a thrombus.   Normal size of the pulmonary artery.   IMPRESSION: 1. Coronary calcium score of 1201. This was 95 percentile for age and sex matched control.   2. Normal coronary origin with right dominance.   3. CAD-RADS 4 Severe stenosis. (70-99%). Diffuse CAD in a bead-like diabetic pattern, there is suspicion for severe stenoses in the mid RCA, ostial portion of a large D2 and proximal portion of LCX artery. Additional analysis with CT FFR will be submitted. We will tentatively plan for a left cardiac catheterization for tomorrow.   EKG:  EKG with NSR with HR 87  Recent Labs: 07/13/2022: Magnesium 1.9 07/25/2022: ALT 11; BUN 8; Creat 0.70; Hemoglobin 15.9; Platelets 221; Potassium 3.9; Sodium 141  Recent Lipid Panel    Component Value Date/Time   CHOL 105 03/26/2022 0000   CHOL 113 10/23/2020 0824   TRIG 256 (H) 03/26/2022 0000   HDL 32 (L) 03/26/2022 0000   HDL 45 10/23/2020 0824   CHOLHDL 3.3 03/26/2022 0000   VLDL 22 11/01/2021 0334   LDLCALC 43 03/26/2022 0000     Physical Exam:    VS:  There were no vitals taken for this visit.    Wt Readings from Last 3 Encounters:  07/30/22 192 lb 7.4 oz (87.3 kg)  07/25/22 196 lb (88.9 kg)  07/23/22 185 lb (83.9 kg)      GEN: Well nourished, well developed in no acute distress HEENT: Normal NECK: No JVD; No carotid bruits CARDIAC: RRR, no murmurs, rubs, gallops RESPIRATORY:  Clear to auscultation without rales, wheezing or rhonchi  ABDOMEN: Soft, non-tender, non-distended MUSCULOSKELETAL:  no edema; No deformity  SKIN: Warm and dry NEUROLOGIC:  Alert and oriented x 3 PSYCHIATRIC:  Normal affect   ASSESSMENT:    No diagnosis found.    PLAN:    In order of problems listed above:  #Multivessel CAD s/p PCI to RCA and recent PCI x2 to Lcx: Patient with PCI to RCA with residual LCx 75%, 50% LAD,  100% OM1,  75% OM2, 75% D1 and D2 in 07/2020 with recurrent angina now s/p PCI to prox and mid Lcx on 01/17/21. Now with significant improvement of symptoms. Able to exercise without issues. Will continue medical therapy as below. -Continue ASA 81mg  daily -Continue plavix 75mg  daily -Continue lipitor 80mg  daily -Continue metop 25mg  XL daily -Continue imdur 30mg  daily -Off losartan due to lightheadedness -Continue nitro as needed for chest pain -If frequency of chest pain increases, can pursue ischemic work-up at that time  #HTN: Well controlled <120/80s with no orthostatic symptoms. -Off amlodipine and losartan due to hypotension -Continue metop 25 and imdur 30mg  daily  #DMII: A1C significantly improved from 10-->6.2 -Continue insulin, jardiance and ozempic  #HLD: LDL at goal 37 in 10/2021. -Continue lipitor  80mg  daily -Goal LDL<70  #Obesity: Significantly improved with BMI 35-->28  -Mediterranean diet -Continue exercise with goal 33min/day -Continue ozempic  #Known OSA not on CPAP: Patient with history of OSA now back on CPAP. -Continue CPAP  Medication Adjustments/Labs and Tests Ordered: Current medicines are reviewed at length with the patient today.  Concerns regarding medicines are outlined above.  No orders of the defined types were placed in this encounter.   No orders of the  defined types were placed in this encounter.    There are no Patient Instructions on file for this visit.   I,Mykaella Javier,acting as a scribe for 31m, MD.,have documented all relevant documentation on the behalf of Meriam Sprague, MD,as directed by  Meriam Sprague, MD while in the presence of Meriam Sprague, MD.  I, Meriam Sprague, MD, have reviewed all documentation for this visit. The documentation on 08/14/22 for the exam, diagnosis, procedures, and orders are all accurate and complete.   Signed, 08/16/22, MD  08/14/2022 5:58 PM    [facility]

## 2022-08-15 ENCOUNTER — Other Ambulatory Visit: Payer: Self-pay

## 2022-08-15 ENCOUNTER — Other Ambulatory Visit (HOSPITAL_BASED_OUTPATIENT_CLINIC_OR_DEPARTMENT_OTHER): Payer: Self-pay

## 2022-08-16 ENCOUNTER — Other Ambulatory Visit (HOSPITAL_BASED_OUTPATIENT_CLINIC_OR_DEPARTMENT_OTHER): Payer: Self-pay

## 2022-08-20 ENCOUNTER — Other Ambulatory Visit (HOSPITAL_BASED_OUTPATIENT_CLINIC_OR_DEPARTMENT_OTHER): Payer: Self-pay

## 2022-08-21 ENCOUNTER — Other Ambulatory Visit: Payer: Self-pay | Admitting: Family Medicine

## 2022-08-21 ENCOUNTER — Other Ambulatory Visit (HOSPITAL_BASED_OUTPATIENT_CLINIC_OR_DEPARTMENT_OTHER): Payer: Self-pay

## 2022-08-21 ENCOUNTER — Encounter: Payer: Self-pay | Admitting: Cardiology

## 2022-08-21 ENCOUNTER — Other Ambulatory Visit: Payer: Self-pay

## 2022-08-21 ENCOUNTER — Other Ambulatory Visit: Payer: Self-pay | Admitting: Student in an Organized Health Care Education/Training Program

## 2022-08-21 ENCOUNTER — Ambulatory Visit: Payer: No Typology Code available for payment source | Attending: Cardiology | Admitting: Cardiology

## 2022-08-21 VITALS — BP 132/68 | HR 80 | Ht 68.0 in | Wt 194.4 lb

## 2022-08-21 DIAGNOSIS — G4733 Obstructive sleep apnea (adult) (pediatric): Secondary | ICD-10-CM | POA: Diagnosis not present

## 2022-08-21 DIAGNOSIS — I10 Essential (primary) hypertension: Secondary | ICD-10-CM | POA: Diagnosis not present

## 2022-08-21 DIAGNOSIS — I2089 Other forms of angina pectoris: Secondary | ICD-10-CM | POA: Diagnosis not present

## 2022-08-21 DIAGNOSIS — E611 Iron deficiency: Secondary | ICD-10-CM

## 2022-08-21 DIAGNOSIS — G959 Disease of spinal cord, unspecified: Secondary | ICD-10-CM

## 2022-08-21 DIAGNOSIS — I2510 Atherosclerotic heart disease of native coronary artery without angina pectoris: Secondary | ICD-10-CM | POA: Diagnosis not present

## 2022-08-21 DIAGNOSIS — E119 Type 2 diabetes mellitus without complications: Secondary | ICD-10-CM

## 2022-08-21 DIAGNOSIS — E782 Mixed hyperlipidemia: Secondary | ICD-10-CM

## 2022-08-21 MED ORDER — METOPROLOL SUCCINATE ER 25 MG PO TB24
25.0000 mg | ORAL_TABLET | Freq: Every day | ORAL | 3 refills | Status: DC
  Filled 2022-08-21 – 2022-10-28 (×3): qty 90, 90d supply, fill #0
  Filled 2023-01-20: qty 90, 90d supply, fill #1

## 2022-08-21 MED ORDER — ISOSORBIDE MONONITRATE ER 30 MG PO TB24
30.0000 mg | ORAL_TABLET | Freq: Every day | ORAL | 3 refills | Status: DC
  Filled 2022-08-31: qty 90, 90d supply, fill #0
  Filled 2022-11-27: qty 90, 90d supply, fill #1
  Filled 2023-02-18: qty 90, 90d supply, fill #2

## 2022-08-21 MED ORDER — IRON (FERROUS SULFATE) 325 (65 FE) MG PO TABS
325.0000 mg | ORAL_TABLET | Freq: Every day | ORAL | 1 refills | Status: DC
  Filled 2022-08-21: qty 100, 100d supply, fill #0
  Filled 2022-11-28: qty 100, 100d supply, fill #1

## 2022-08-21 MED ORDER — CLOPIDOGREL BISULFATE 75 MG PO TABS
75.0000 mg | ORAL_TABLET | Freq: Every day | ORAL | 3 refills | Status: DC
  Filled 2022-08-21: qty 90, 90d supply, fill #0
  Filled 2022-11-18: qty 90, 90d supply, fill #1

## 2022-08-21 MED ORDER — ATORVASTATIN CALCIUM 80 MG PO TABS
80.0000 mg | ORAL_TABLET | Freq: Every day | ORAL | 3 refills | Status: DC
  Filled 2022-08-23: qty 90, 90d supply, fill #0
  Filled 2022-11-18: qty 90, 90d supply, fill #1
  Filled 2023-02-18: qty 90, 90d supply, fill #2
  Filled 2023-05-19: qty 90, 90d supply, fill #3

## 2022-08-21 NOTE — Progress Notes (Signed)
Cardiology Office Note:    Date:  08/21/2022   ID:  Brandon [name], DOB 06-14-1956, MRN [id number]  PCP:  Luetta Nutting, [facility]  Cardiologist:  Freada Bergeron, MD  Advanced Practice Provider:  No care team member to display Electrophysiologist:  None   AP:[id number]   Referring MD: Luetta Nutting, DO     History of Present Illness:    Brandon [name] is a 66 y.o. male with a hx of HTN, HLD, DMII, and CAD s/p PCI to RCA and Lcx who now presents to clinic for follow-up.  Patient was admitted to the hospital on 08/11/2020 with chest pain.  CTA of the chest showed no evidence of aortic dissection or aneurysm.  Respiratory panel negative for influenza and Covid.  D-dimer negative.  Troponin negative x2.  TTE 08/24/2020 showed EF 60 to 65%, no significant valve issue.  Given the atypical nature of his symptoms, he underwent coronary CT on 08/14/2020 which showed coronary calcium score of 1201 which placed the patient in 95th percentile for age and sex matched control, diffuse CAD with beadlike diabetic pattern with suspicion for severe stenosis in the mid RCA, ostial D2 and proximal left circumflex artery.  He subsequently underwent cardiac catheterization on 08/15/2020 which showed 90% mid RCA lesion, 75% proximal left circumflex artery, 75% OM 2, 100% OM1 with faint left to left and right-to-left collaterals, 70% RPDA, 75% D2, 30% distal LAD lesion.  The 90% mid RCA lesion was treated successfully with a 4.0 x 38 mm resolute DES.  The small disease in RPDA, OM1, OM2 at the diagonal vessel was managed medically. Postprocedure, he continued to have chest discomfort.  A bedside echocardiogram obtained on 08/15/2020 did not reveal any pericardial effusion, there was no change in LV function.  He was treated with colchicine 0.6 mg twice a day, colchicine was subsequently discontinued due to lack of recurrent symptoms.  Hemoglobin A1c obtained during this admission  was 10, he has been advised to follow-up with his primary care provider.  During visit on 12/04/20, the patient was having significant DOE in the setting of known significant Lcx disease.He failed antianginal medication up-titration and therefore went for cath on 01/17/21 which revealed severe prox Lcx stenosis, severe mid Lcx stenosis and chronic occlusion of OM1. Proximal RCA stent was patent. Severe disease in PDA (small caliber) unchanged from last cath. He received PCI with DES to prox Lcx and mid Lcx.   He presented to the ED 10/22/21 for fatigue, confusion and malaise in the setting of low blood pressures. Work-up there with nonischemic ECG, normal trop, normal d-dimer, reassuring BNP. CXR without infiltrate. He clinically improved and was discharged home.   Was admitted 12/2021 for partial SBO that was managed conservatively.  Was last seen in clinic on 01/2022 where he was doing well from a CV standpoint. Remained active.  Today, he says he is feeling good. He is active without anginal symptoms. Has been compliant with his medications. A1C is much better controlled at 6.1 on insulin pump. He denies any palpitations, chest pain, shortness of breath, or peripheral edema. No lightheadedness, headaches, syncope, orthopnea, or PND. No bleeding issues on DAPT.  Past Medical History:  Diagnosis Date   Anxiety    Arthritis    Basal cell carcinoma (BCC) in situ of skin    left neck   Chronic back pain    has spinal cord stimulator and wears buprenorphine patch   Colon  polyps    Coronary artery disease    S/p DES to mRCA in 12/21 // S/p DES to LCx x 2 in 12/2020 // Diff dz in small to mod Dx (not a good target for PCI); OM1 100 CTO; severe dz in small OM2 and PDA >> Med Rx   Depression    Diabetes mellitus without complication (HCC)    type 2   Fibromyalgia    Gallstones    GERD (gastroesophageal reflux disease)    Hepatitis B    HLD (hyperlipidemia)    Hypertension    Osteoarthritis     Pneumonia    PONV (postoperative nausea and vomiting)    SBO (small bowel obstruction) (HCC)    Sleep apnea    does not use CPAP    Past Surgical History:  Procedure Laterality Date   CARDIAC CATHETERIZATION  01/17/2021   CATARACT EXTRACTION W/ INTRAOCULAR LENS  IMPLANT, BILATERAL     CHOLECYSTECTOMY     CHOLECYSTECTOMY, LAPAROSCOPIC     CORONARY STENT INTERVENTION N/A 08/15/2020   Procedure: CORONARY STENT INTERVENTION;  Surgeon: Jettie Booze, MD;  Location: [place] [facility];  Service: Cardiovascular;  Laterality: N/A;   CORONARY STENT INTERVENTION N/A 01/17/2021   Procedure: CORONARY STENT INTERVENTION;  Surgeon: Burnell Blanks, MD;  Location: [place] [facility];  Service: Cardiovascular;  Laterality: N/A;   DRUG INDUCED ENDOSCOPY N/A 05/23/2022   Procedure: DRUG INDUCED SLEEP ENDOSCOPY;  Surgeon: Jerrell Belfast, MD;  Location: [place];  Service: ENT;  Laterality: N/A;   HERNIA REPAIR     IMPLANTATION OF HYPOGLOSSAL NERVE STIMULATOR Right 07/30/2022   Procedure: IMPLANTATION OF HYPOGLOSSAL NERVE STIMULATOR;  Surgeon: Jerrell Belfast, MD;  Location: [place];  Service: ENT;  Laterality: Right;   INTRAVASCULAR ULTRASOUND/IVUS N/A 08/15/2020   Procedure: Intravascular Ultrasound/IVUS;  Surgeon: Jettie Booze, MD;  Location: [place] [facility];  Service: Cardiovascular;  Laterality: N/A;   LEFT HEART CATH AND CORONARY ANGIOGRAPHY N/A 08/15/2020   Procedure: LEFT HEART CATH AND CORONARY ANGIOGRAPHY;  Surgeon: Jettie Booze, MD;  Location: [place] [facility];  Service: Cardiovascular;  Laterality: N/A;   LEFT HEART CATH AND CORONARY ANGIOGRAPHY N/A 01/17/2021   Procedure: LEFT HEART CATH AND CORONARY ANGIOGRAPHY;  Surgeon: Burnell Blanks, MD;  Location: [place] [facility];  Service: Cardiovascular;  Laterality: N/A;   LUMBAR FUSION     L3-5 with rods   MICRODISCECTOMY LUMBAR  2006   SPINAL CORD STIMULATOR INSERTION     blood clot  removed from spinal cord   SPINE SURGERY     blood clot removed from spinal cord   XI ROBOTIC ASSISTED INGUINAL HERNIA REPAIR WITH MESH     x 3 surgeries    Current Medications: Current Meds  Medication Sig   acetaminophen (TYLENOL) 500 MG tablet Take 1,000 mg by mouth See admin instructions. Take 2 tablets (1000 mg) by mouth scheduled in the morning & may take 2 tablets (1000 mg) by mouth every 6 hours if needed for pain.   ARIPiprazole (ABILIFY) 10 MG tablet Take 1 tablet (10 mg total) by mouth every morning.   b complex vitamins capsule Take 1 capsule by mouth in the morning.   buprenorphine (BUTRANS) 15 MCG/HR Place 1 patch onto the skin every Friday.   buPROPion (WELLBUTRIN XL) 300 MG 24 hr tablet Take 1 tablet (300 mg total) by mouth daily.   Continuous Blood Gluc Sensor (DEXCOM G6 SENSOR) MISC Use as directed.  Change sensor every 10 days   docusate sodium (COLACE) 100 MG capsule Take 200 mg by mouth in the morning.   empagliflozin (JARDIANCE) 25 MG TABS tablet Take 1 tablet (25 mg total) by mouth daily.   GVOKE HYPOPEN 1-PACK 1 MG/0.2ML SOAJ Use as needed for hypoglycemia (Patient taking differently: 1 mg once. Use as needed for hypoglycemia)   HYDROcodone-acetaminophen (NORCO) 5-325 MG tablet Take 1 to 2 tablets by mouth every 12 hours as needed for postoperative pain.  Alternate every 6 hours with ibuprofen.   insulin lispro (HUMALOG) 100 UNIT/ML injection For use in pump, total of 60 units per day (Patient taking differently: Inject 60 Units into the skin See admin instructions. For use in pump, total of 60 units per day)   Iron, Ferrous Sulfate, 325 (65 Fe) MG TABS Take 1 tablet (325 mg) by mouth daily.   lidocaine (LIDODERM) 5 % Apply 1 - 3 patches on to the skin as directed. 12 hours on and 12 hours off.   magnesium oxide (MAG-OX) 400 (240 Mg) MG tablet Take 800 mg by mouth in the morning.   metoCLOPramide (REGLAN) 10 MG tablet Take 10 mg by mouth daily as needed for nausea or  vomiting (upset stomach).   Na Sulfate-K Sulfate-Mg Sulf 17.5-3.13-1.6 GM/177ML SOLN Take by mouth as directed   nitroGLYCERIN (NITROSTAT) 0.4 MG SL tablet Place 0.4 mg under the tongue every 5 (five) minutes x 3 doses as needed for chest pain.   ondansetron (ZOFRAN-ODT) 4 MG disintegrating tablet Take 1 tablet (4 mg total) by mouth every 8 (eight) hours as needed for nausea or vomiting.   pantoprazole (PROTONIX) 40 MG tablet Take 1 tablet (40 mg total) by mouth daily.   Semaglutide, 2 MG/DOSE, (OZEMPIC, 2 MG/DOSE,) 8 MG/3ML SOPN Inject 2 mg into the skin once a week. (Patient taking differently: Inject 2 mg into the skin every Wednesday.)   tamsulosin (FLOMAX) 0.4 MG CAPS capsule Take 1 capsule (0.4 mg total) by mouth daily.   testosterone (ANDROGEL) 50 MG/5GM (1%) GEL Place 5 g (1 packet) onto the shoulders and upper arms daily as directed.   tiZANidine (ZANAFLEX) 4 MG tablet Take 1-1&1/2 tablets (4-6 mg total) by mouth every 8 (eight) hours as needed for muscle spasms. (Patient taking differently: Take 8 mg by mouth every 8 (eight) hours as needed for muscle spasms.)   [DISCONTINUED] aspirin 81 MG EC tablet Take 81 mg by mouth every morning.   [DISCONTINUED] atorvastatin (LIPITOR) 80 MG tablet Take 1 tablet (80 mg total) by mouth daily.   [DISCONTINUED] clopidogrel (PLAVIX) 75 MG tablet TAKE 1 TABLET (75 MG TOTAL) BY MOUTH DAILY WITH BREAKFAST. (Patient taking differently: Take 75 mg by mouth daily with breakfast.)   [DISCONTINUED] isosorbide mononitrate (IMDUR) 30 MG 24 hr tablet Take 1 tablet (30 mg total) by mouth daily.   [DISCONTINUED] metoprolol succinate (TOPROL XL) 25 MG 24 hr tablet Take 1 tablet (25 mg total) by mouth daily. Please keep upcoming appointment for future refills. Thank you     Allergies:   Kiwi extract, Other, Penicillins, Ancef [cefazolin], Latex, Levaquin [levofloxacin], and Peach [prunus persica]   Social History   Socioeconomic History   Marital status: Married     Spouse name: Not on file   Number of children: 4   Years of education: 16   Highest education level: Not on file  Occupational History   Occupation: disabled/ respiratory therapist  Tobacco Use   Smoking status: Former  Packs/day: 1.00    Years: 30.00    Total pack years: 30.00    Types: Cigarettes    Quit date: 2007    Years since quitting: 16.9   Smokeless tobacco: Never  Vaping Use   Vaping Use: Never used  Substance and Sexual Activity   Alcohol use: Not Currently   Drug use: Not Currently   Sexual activity: Yes  Other Topics Concern   Not on file  Social History Narrative   Not on file   Social Determinants of Health   Financial Resource Strain: Not on file  Food Insecurity: No Food Insecurity (07/13/2022)   Hunger Vital Sign    Worried About Running Out of Food in the Last Year: Never true    Ran Out of Food in the Last Year: Never true  Transportation Needs: No Transportation Needs (07/13/2022)   PRAPARE - Administrator, Civil Service (Medical): No    Lack of Transportation (Non-Medical): No  Physical Activity: Not on file  Stress: Not on file  Social Connections: Not on file     Family History: The patient's family history includes Anxiety disorder in his father and mother; CAD in his brother; COPD in his mother; Cancer in his father; Colon cancer in his mother; Coronary artery disease in his father; Depression in his father and mother; Leukemia in his maternal grandmother; Multiple sclerosis in his daughter. There is no history of Diabetes.  ROS:   Please see the history of present illness. Review of Systems  Constitutional:  Negative for chills and fever.  HENT:  Negative for congestion and sore throat.   Eyes:  Negative for blurred vision and discharge.  Respiratory:  Negative for cough and shortness of breath.   Cardiovascular:  Negative for chest pain, palpitations, orthopnea, claudication, leg swelling and PND.  Gastrointestinal:   Negative for nausea and vomiting.  Genitourinary:  Negative for dysuria and urgency.  Musculoskeletal:  Positive for back pain. Negative for myalgias.  Skin:  Negative for itching and rash.  Neurological:  Negative for dizziness and headaches.  Endo/Heme/Allergies:  Negative for polydipsia. Does not bruise/bleed easily.  Psychiatric/Behavioral:  The patient is not nervous/anxious and does not have insomnia.    All other systems reviewed and are negative.  EKGs/Labs/Other Studies Reviewed:    The following studies were reviewed today:  TTE 11/01/2021: IMPRESSIONS    1. Left ventricular ejection fraction, by estimation, is 60 to 65%. The  left ventricle has normal function. The left ventricle has no regional  wall motion abnormalities. There is moderate concentric left ventricular  hypertrophy. Left ventricular  diastolic parameters are consistent with Grade I diastolic dysfunction  (impaired relaxation).   2. Right ventricular systolic function is normal. The right ventricular  size is normal. Tricuspid regurgitation signal is inadequate for assessing  PA pressure.   3. The mitral valve is normal in structure. No evidence of mitral valve  regurgitation. No evidence of mitral stenosis.   4. The aortic valve is tricuspid. Aortic valve regurgitation is not  visualized.   5. The inferior vena cava is normal in size with greater than 50%  respiratory variability, suggesting right atrial pressure of 3 mmHg.   Comparison(s): No significant change from prior study.   Left heart cath 01/17/2021: 2nd Diag-2 lesion is 75% stenosed. 2nd Diag-1 lesion is 75% stenosed. Dist LAD lesion is 50% stenosed. 2nd Mrg lesion is 75% stenosed. 1st Mrg lesion is 100% stenosed. RPDA lesion is 70% stenosed. Previously  placed Mid RCA stent (unknown type) is widely patent. Dist RCA lesion is 50% stenosed. Ost RCA to Prox RCA lesion is 30% stenosed. Mid Cx lesion is 90% stenosed. Ost Cx to Prox Cx lesion  is 75% stenosed. A drug-eluting stent was successfully placed using a STENT RESOLUTE ONYX 3.5X15. Post intervention, there is a 0% residual stenosis. A drug-eluting stent was successfully placed using a STENT RESOLUTE ONYX 2.25X8. Post intervention, there is a 0% residual stenosis.   1. Non-obstructive disease in the LAD. Diffuse disease in the small to moderate caliber Diagonal branch, unchanged from last cath and not a good target for PCI.  2. Severe proximal Circumflex stenosis. Severe mid Circumflex stenosis. Chronic occlusion OM1. Severe diffuse disease in the small caliber second OM branch.  4. Patent proximal to mid RCA stent. Severe disease in the small caliber PDA, unchanged from last cath.  5. Successful PTCA/DES x 1 proximal Circumflex 6. Successful PTCA/DES x 1 mid Circumflex   Echo 08/13/2020  1. Left ventricular ejection fraction, by estimation, is 60 to 65%. The  left ventricle has normal function. The left ventricle has no regional  wall motion abnormalities. There is mild left ventricular hypertrophy.  Left ventricular diastolic parameters  are consistent with Grade I diastolic dysfunction (impaired relaxation).   2. Right ventricular systolic function is normal. The right ventricular  size is normal.   3. The mitral valve is normal in structure. No evidence of mitral valve  regurgitation. No evidence of mitral stenosis.   4. The aortic valve is tricuspid. Aortic valve regurgitation is not  visualized. No aortic stenosis is present.   5. The inferior vena cava is normal in size with greater than 50%  respiratory variability, suggesting right atrial pressure of 3 mmHg.  Cath 08/15/20: Mid RCA lesion is 90% stenosed. Prox Cx lesion is 75% stenosed. 2nd Mrg lesion is 75% stenosed. 1st Mrg lesion is 100% stenosed. Faint right to left and left to left collaterals. RPDA lesion is 70% stenosed. 2nd Diag-1 lesion is 75% stenosed. 2nd Diag-2 lesion is 75% stenosed. Dist LAD  lesion is 50% stenosed. A drug-eluting stent was successfully placed using a STENT RESOLUTE ONYX 4.0X38, optimized with IVUS. Post intervention, there is a 0% residual stenosis. LV end diastolic pressure is normal. There is no aortic valve stenosis. 75 cm long sheath needed for support due to tortuosity in the right subclavian to engage the RCA. Left coronary could be engaged easily with short sheath.   Diffuse multivessel disease, but no LAD involvement.     RCA successfully treated.  Small vessel disease noted in the RPDA, OM1 and OM2, second diagonal and apical LAD.  Proximal circumflex would be a potential target for PCI if he had more symptoms.   CTA 08/14/20: 1. Left Main: 0.98. 2. LAD: Proximal: 0.97, distal: 0.87. 3. D1: Proximal: 0.75. 4. LCX: Proximal; 0.94, mid: Occluded. 5. RCA: Proximal: 0.98, distal: 0.77.   IMPRESSION: 1. CT FFR analysis showed significant stenoses in proximal RCA, ostial portion of a large D1 and occluded proximal portion of LCX artery. Left cardiac catheterization is recommended.   FINDINGS: A 100 kV prospective scan was triggered in the descending thoracic aorta at 111 HU's. Axial non-contrast 3 mm slices were carried out through the heart. The data set was analyzed on a dedicated work station and scored using the [place]. Gantry rotation speed was 250 msecs and collimation was .6 mm. 100 mg of PO Metoprolol and 0.8 mg of sl NTG  were given. The 3D data set was reconstructed in 5% intervals of the 67-82 % of the R-R cycle. Diastolic phases were analyzed on a dedicated work station using MPR, MIP and VRT modes. The patient received 80 cc of contrast.   Aorta: Normal size. Mild diffuse atherosclerotic plaque and calcifications. No dissection.   Aortic Valve:  Trileaflet.  No calcifications.   Coronary Arteries:  Normal coronary origin.  Right dominance.   RCA is a large dominant artery that gives rise to PDA and PLA. There is mild  calcified plaque in the proximal RCA with stenosis 25-49%. Mid RCA has severe diffuse predominantly calcified plaque with a focal stenosis suspicious for > 70%. Distal RCA has moderate calcified plaque prior to bifurcation into PLA and PDA with stenosis 50-69%.   PLA is small with diffuse moderate plaque.   PDA is a medium caliber artery with diffuse moderate plaque in a bead like pattern.   Left main is a large artery that gives rise to LAD and LCX arteries. Left main is a long artery with minimal calcified plaque and stenosis 0-25%.   LAD is a large vessel that gives rise to two diagonal arteries. There is mild diffuse calcified plaque with focal stenoses in the proximal and mid LAD of 25-49%.   D1 is very small.   D2 is a medium caliber artery (2.1 mm) that has severe mixed ostial plaque with lipid rich core and is suspicious for stenosis > 70%.   LCX is a large caliber non-dominant artery that gives rise to two OM branches. Proximal LCX artery has severe non-calcified plaque suspicious for stenosis > 70%, this is followed by a moderate diffuse calcified plaque with stenosis 50-69%. Mid and distal LCX artery has small lumen and mild diffuse plaque.   OM1 is very small and has severe diffuse plaque.   OM2 is a medium caliber artery with diffuse moderate plaque in a bead like pattern.   Other findings:   Normal pulmonary vein drainage into the left atrium.   Normal left atrial appendage without a thrombus.   Normal size of the pulmonary artery.   IMPRESSION: 1. Coronary calcium score of 1201. This was 95 percentile for age and sex matched control.   2. Normal coronary origin with right dominance.   3. CAD-RADS 4 Severe stenosis. (70-99%). Diffuse CAD in a bead-like diabetic pattern, there is suspicion for severe stenoses in the mid RCA, ostial portion of a large D2 and proximal portion of LCX artery. Additional analysis with CT FFR will be submitted. We  will tentatively plan for a left cardiac catheterization for tomorrow.   EKG: EKG is personally reviewed. 08/21/22: EKG was not ordered. 02/15/22: EKG with NSR with HR 87  Recent Labs: 07/13/2022: Magnesium 1.9 07/25/2022: ALT 11; BUN 8; Creat 0.70; Hemoglobin 15.9; Platelets 221; Potassium 3.9; Sodium 141   Recent Lipid Panel    Component Value Date/Time   CHOL 105 03/26/2022 0000   CHOL 113 10/23/2020 0824   TRIG 256 (H) 03/26/2022 0000   HDL 32 (L) 03/26/2022 0000   HDL 45 10/23/2020 0824   CHOLHDL 3.3 03/26/2022 0000   VLDL 22 11/01/2021 0334   LDLCALC 43 03/26/2022 0000     Physical Exam:    VS:  BP 132/68   Pulse 80   Ht 5\' 8"  (1.727 m)   Wt 194 lb 6.4 oz (88.2 kg)   SpO2 94%   BMI 29.56 kg/m     Wt Readings from Last  3 Encounters:  08/21/22 194 lb 6.4 oz (88.2 kg)  07/30/22 192 lb 7.4 oz (87.3 kg)  07/25/22 196 lb (88.9 kg)     GEN: Well nourished, well developed in no acute distress HEENT: Normal NECK: No JVD; No carotid bruits CARDIAC: RRR, 1/6 systolic murmur. No rubs, gallops RESPIRATORY:  Clear to auscultation without rales, wheezing or rhonchi  ABDOMEN: Soft, non-tender, non-distended MUSCULOSKELETAL:  no edema; No deformity  SKIN: Warm and dry NEUROLOGIC:  Alert and oriented x 3 PSYCHIATRIC:  Normal affect   ASSESSMENT:    1. Coronary artery disease involving native coronary artery of native heart without angina pectoris   2. Primary hypertension   3. OSA (obstructive sleep apnea)   4. Stable angina pectoris   5. Hypertension, unspecified type   6. Mixed hyperlipidemia   7. Diabetes mellitus with coincident hypertension ([place])   8. Essential hypertension    PLAN:    In order of problems listed above:  #Multivessel CAD s/p PCI to RCA and recent PCI x2 to Lcx: Patient with PCI to RCA with residual LCx 75%, 50% LAD,  100% OM1,  75% OM2, 75% D1 and D2 in 07/2020 with recurrent angina now s/p PCI to prox and mid Lcx on 01/17/21. Now with  significant improvement of symptoms. Able to exercise without issues. Will continue medical therapy as below. -Stop ASA 81mg  daily as completed >1 year of DAPT -Continue plavix 75mg  daily; Okay to hold for colonoscopy -Continue lipitor 80mg  daily -Continue metop 25mg  XL daily -Continue imdur 30mg  daily -Off losartan due to lightheadedness -Continue nitro as needed for chest pain  #HTN: Well controlled <120/80s with no orthostatic symptoms. -Off amlodipine and losartan due to hypotension -Continue metop 25 and imdur 30mg  daily  #DMII: A1C significantly improved from 10-->6.1 -Continue insulin, jardiance and ozempic  #HLD: LDL at goal 43 in 03/2022. -Continue lipitor 80mg  daily -Goal LDL<55  -Will have labs with PCP  #Obesity: Significantly improved with BMI 35-->29. -Mediterranean diet -Continue exercise with goal 87min/day -Continue ozempic  #Known OSA not on CPAP: Patient with history of OSA now s/p Inspire device placement -Follow-up with Dr. Radford Pax as scheduled   Follow up: 46months   Medication Adjustments/Labs and Tests Ordered: Current medicines are reviewed at length with the patient today.  Concerns regarding medicines are outlined above.  No orders of the defined types were placed in this encounter.  Meds ordered this encounter  Medications   atorvastatin (LIPITOR) 80 MG tablet    Sig: Take 1 tablet (80 mg total) by mouth daily.    Dispense:  90 tablet    Refill:  3   clopidogrel (PLAVIX) 75 MG tablet    Sig: Take 1 tablet (75 mg total) by mouth daily with breakfast.    Dispense:  90 tablet    Refill:  3   isosorbide mononitrate (IMDUR) 30 MG 24 hr tablet    Sig: Take 1 tablet (30 mg total) by mouth daily.    Dispense:  90 tablet    Refill:  3   metoprolol succinate (TOPROL XL) 25 MG 24 hr tablet    Sig: Take 1 tablet (25 mg total) by mouth daily.    Dispense:  90 tablet    Refill:  3    DOSE DECREASE   Patient Instructions  Medication  Instructions:  Your physician has recommended you make the following change in your medication:  1-STOP Aspirin  *If you need a refill on your cardiac medications before your  next appointment, please call your pharmacy*  Lab Work: If you have labs (blood work) drawn today and your tests are completely normal, you will receive your results only by: [place] (if you have MyChart) OR A paper copy in the mail If you have any lab test that is abnormal or we need to change your treatment, we will call you to review the results.  Testing/Procedures: None ordered today.  Follow-Up: At [facility], you and your health needs are our priority.  As part of our continuing mission to provide you with exceptional heart care, we have created designated Provider Care Teams.  These Care Teams include your primary Cardiologist (physician) and Advanced Practice Providers (APPs -  Physician Assistants and Nurse Practitioners) who all work together to provide you with the care you need, when you need it.  We recommend signing up for the patient portal called "MyChart".  Sign up information is provided on this After Visit Summary.  MyChart is used to connect with patients for Virtual Visits (Telemedicine).  Patients are able to view lab/test results, encounter notes, upcoming appointments, etc.  Non-urgent messages can be sent to your provider as well.   To learn more about what you can do with MyChart, go to [email or web address].    Your next appointment:   6 month(s)  The format for your next appointment:   In Person  Provider:   Freada Bergeron, MD     Important Information About Sugar         I,Breanna Adamick,acting as a scribe for Freada Bergeron, MD.,have documented all relevant documentation on the behalf of Freada Bergeron, MD,as directed by  Freada Bergeron, MD while in the presence of Freada Bergeron, MD.   I, Freada Bergeron, MD, have reviewed  all documentation for this visit. The documentation on 08/21/22 for the exam, diagnosis, procedures, and orders are all accurate and complete.   Signed, Freada Bergeron, MD  08/21/2022 9:39 AM    [place]

## 2022-08-21 NOTE — Patient Instructions (Signed)
Medication Instructions:  Your physician has recommended you make the following change in your medication:  1-STOP Aspirin  *If you need a refill on your cardiac medications before your next appointment, please call your pharmacy*  Lab Work: If you have labs (blood work) drawn today and your tests are completely normal, you will receive your results only by: MyChart Message (if you have MyChart) OR A paper copy in the mail If you have any lab test that is abnormal or we need to change your treatment, we will call you to review the results.  Testing/Procedures: None ordered today.  Follow-Up: At [facility], you and your health needs are our priority.  As part of our continuing mission to provide you with exceptional heart care, we have created designated Provider Care Teams.  These Care Teams include your primary Cardiologist (physician) and Advanced Practice Providers (APPs -  Physician Assistants and Nurse Practitioners) who all work together to provide you with the care you need, when you need it.  We recommend signing up for the patient portal called "MyChart".  Sign up information is provided on this After Visit Summary.  MyChart is used to connect with patients for Virtual Visits (Telemedicine).  Patients are able to view lab/test results, encounter notes, upcoming appointments, etc.  Non-urgent messages can be sent to your provider as well.   To learn more about what you can do with MyChart, go to [email or web address].    Your next appointment:   6 month(s)  The format for your next appointment:   In Person  Provider:   Meriam Sprague, MD     Important Information About Sugar

## 2022-08-22 ENCOUNTER — Other Ambulatory Visit (HOSPITAL_BASED_OUTPATIENT_CLINIC_OR_DEPARTMENT_OTHER): Payer: Self-pay

## 2022-08-23 ENCOUNTER — Telehealth: Payer: Self-pay

## 2022-08-23 ENCOUNTER — Other Ambulatory Visit (HOSPITAL_BASED_OUTPATIENT_CLINIC_OR_DEPARTMENT_OTHER): Payer: Self-pay

## 2022-08-23 NOTE — Telephone Encounter (Addendum)
Initiated Prior authorization ZOX:[id number] 50 MG/5GM(1%) gel Via: Covermymeds Case/Key:[id number] Status: approved  as of 08/23/22 Reason:The request has been approved. The authorization is effective from 08/23/2022 to 08/23/2023, as long as the member is enrolled in their current health plan. The request was approved with a quantity restriction. This has been approved for a max daily dosage of 10. A written notification letter will follow with additional details. Notified Pt via: Mychart

## 2022-08-26 ENCOUNTER — Other Ambulatory Visit: Payer: Self-pay

## 2022-08-27 ENCOUNTER — Other Ambulatory Visit (HOSPITAL_BASED_OUTPATIENT_CLINIC_OR_DEPARTMENT_OTHER): Payer: Self-pay

## 2022-08-28 ENCOUNTER — Other Ambulatory Visit: Payer: Self-pay | Admitting: Student in an Organized Health Care Education/Training Program

## 2022-08-28 ENCOUNTER — Other Ambulatory Visit (HOSPITAL_BASED_OUTPATIENT_CLINIC_OR_DEPARTMENT_OTHER): Payer: Self-pay

## 2022-08-28 DIAGNOSIS — G4733 Obstructive sleep apnea (adult) (pediatric): Secondary | ICD-10-CM

## 2022-08-28 DIAGNOSIS — G959 Disease of spinal cord, unspecified: Secondary | ICD-10-CM

## 2022-08-29 ENCOUNTER — Other Ambulatory Visit (HOSPITAL_BASED_OUTPATIENT_CLINIC_OR_DEPARTMENT_OTHER): Payer: Self-pay

## 2022-08-29 DIAGNOSIS — G4733 Obstructive sleep apnea (adult) (pediatric): Secondary | ICD-10-CM | POA: Diagnosis not present

## 2022-08-30 ENCOUNTER — Other Ambulatory Visit (HOSPITAL_BASED_OUTPATIENT_CLINIC_OR_DEPARTMENT_OTHER): Payer: Self-pay

## 2022-08-31 ENCOUNTER — Other Ambulatory Visit: Payer: Self-pay | Admitting: Family Medicine

## 2022-09-02 ENCOUNTER — Other Ambulatory Visit (HOSPITAL_BASED_OUTPATIENT_CLINIC_OR_DEPARTMENT_OTHER): Payer: Self-pay

## 2022-09-02 ENCOUNTER — Other Ambulatory Visit: Payer: Self-pay

## 2022-09-03 ENCOUNTER — Other Ambulatory Visit (HOSPITAL_BASED_OUTPATIENT_CLINIC_OR_DEPARTMENT_OTHER): Payer: Self-pay

## 2022-09-03 MED ORDER — PANTOPRAZOLE SODIUM 40 MG PO TBEC
40.0000 mg | DELAYED_RELEASE_TABLET | Freq: Every day | ORAL | 0 refills | Status: DC
  Filled 2022-09-03: qty 90, 90d supply, fill #0

## 2022-09-03 MED ORDER — BUPROPION HCL ER (XL) 300 MG PO TB24
300.0000 mg | ORAL_TABLET | Freq: Every day | ORAL | 0 refills | Status: DC
  Filled 2022-09-03: qty 90, 90d supply, fill #0

## 2022-09-04 ENCOUNTER — Encounter: Payer: Self-pay | Admitting: Cardiology

## 2022-09-04 ENCOUNTER — Other Ambulatory Visit (HOSPITAL_BASED_OUTPATIENT_CLINIC_OR_DEPARTMENT_OTHER): Payer: Self-pay

## 2022-09-04 ENCOUNTER — Ambulatory Visit: Payer: 59 | Attending: Cardiology | Admitting: Cardiology

## 2022-09-04 VITALS — BP 96/58 | HR 78 | Ht 68.0 in | Wt 189.0 lb

## 2022-09-04 DIAGNOSIS — G4733 Obstructive sleep apnea (adult) (pediatric): Secondary | ICD-10-CM

## 2022-09-04 DIAGNOSIS — I10 Essential (primary) hypertension: Secondary | ICD-10-CM

## 2022-09-04 MED ORDER — TIZANIDINE HCL 4 MG PO TABS
4.0000 mg | ORAL_TABLET | Freq: Three times a day (TID) | ORAL | 3 refills | Status: DC | PRN
  Filled 2022-09-04: qty 90, 20d supply, fill #0
  Filled 2022-12-12: qty 90, 20d supply, fill #1
  Filled 2023-02-18: qty 90, 20d supply, fill #2

## 2022-09-04 NOTE — Patient Instructions (Signed)
Medication Instructions:  Your physician recommends that you continue on your current medications as directed. Please refer to the Current Medication list given to you today.  *If you need a refill on your cardiac medications before your next appointment, please call your pharmacy*  Lab Work: NONE  Testing/Procedures: NONE  Follow-Up: At [facility], you and your health needs are our priority.  As part of our continuing mission to provide you with exceptional heart care, we have created designated Provider Care Teams.  These Care Teams include your primary Cardiologist (physician) and Advanced Practice Providers (APPs -  Physician Assistants and Nurse Practitioners) who all work together to provide you with the care you need, when you need it.  Your next appointment:   4 week(s) virtual visit with Dr. Fransico Him (inspire device follow-up) 8 weeks in person with Dr. Fransico Him and Dawna Part Rep 12 weeks in lab for inspire device titration  The format for your next appointment:   In Person  Provider:   Fransico Him, MD  Important Information About Sugar

## 2022-09-04 NOTE — Progress Notes (Unsigned)
Sleep medicine office Note:    Date:  09/04/2022   ID:  Brandon [name], DOB 02/25/1956, MRN [id number]  PCP:  Luetta Nutting, DO  Cardiologist:  Freada Bergeron, MD    Referring MD: Luetta Nutting, DO   Chief Complaint  Patient presents with   Sleep Apnea   Hypertension    Inspire Activiation    History of Present Illness:    Brandon [name] is a 67 y.o. male with a hx of CAD, hypertension, diabetes who was referred for sleep study testing by Dr. Johney Frame.  He has a history of obstructive sleep apnea the past but stopped using his CPAP and was referred back for repeat sleep study to get back on CPAP therapy.  He underwent split-night sleep study in June 2022 which revealed severe obstructive sleep apnea with an AHI of 45.7/h and O2 saturations as low as 54% consistent with nocturnal hypoxemia.  He failed CPAP therapy due to ongoing events and underwent BiPAP titration and was placed on auto BiPAP with IPAP max 20 cm H2O, EPAP min 5 cm H2O and pressure support 4 cm H2O  He really struggled with auto BiPAP device.  He was having problems waking up about 4 hours after starting to sleep because his mask was leaking and he would take it off.  He was having significant problems with the mask really leaking and so we ordered him new PAP supplies including a new nasal cushion with chin strap as he had not changed his cushion out since he got his device.    He was referred to ENT and ultimately underwent Inspire device implant 07/30/22 by Dr. Wilburn Cornelia.  He is now here for [facility] activation.  He has not had any problems with his surgical site.     Past Medical History:  Diagnosis Date   Anxiety    Arthritis    Basal cell carcinoma (BCC) in situ of skin    left neck   Chronic back pain    has spinal cord stimulator and wears buprenorphine patch   Colon polyps    Coronary artery disease    S/p DES to mRCA in 12/21 // S/p DES to LCx x 2 in 12/2020 // Diff dz in small to mod Dx (not a good  target for PCI); OM1 100 CTO; severe dz in small OM2 and PDA >> Med Rx   Depression    Diabetes mellitus without complication (HCC)    type 2   Fibromyalgia    Gallstones    GERD (gastroesophageal reflux disease)    Hepatitis B    HLD (hyperlipidemia)    Hypertension    Osteoarthritis    Pneumonia    PONV (postoperative nausea and vomiting)    SBO (small bowel obstruction) ([place])    Sleep apnea    does not use CPAP    Past Surgical History:  Procedure Laterality Date   CARDIAC CATHETERIZATION  01/17/2021   CATARACT EXTRACTION W/ INTRAOCULAR LENS  IMPLANT, BILATERAL     CHOLECYSTECTOMY     CHOLECYSTECTOMY, LAPAROSCOPIC     CORONARY STENT INTERVENTION N/A 08/15/2020   Procedure: CORONARY STENT INTERVENTION;  Surgeon: Jettie Booze, MD;  Location: [place] [facility];  Service: Cardiovascular;  Laterality: N/A;   CORONARY STENT INTERVENTION N/A 01/17/2021   Procedure: CORONARY STENT INTERVENTION;  Surgeon: Burnell Blanks, MD;  Location: [place] [facility];  Service: Cardiovascular;  Laterality: N/A;   DRUG INDUCED ENDOSCOPY N/A 05/23/2022   Procedure:  DRUG INDUCED SLEEP ENDOSCOPY;  Surgeon: Osborn Coho, MD;  [facility] OR;  Service: ENT;  Laterality: N/A;   HERNIA REPAIR     IMPLANTATION OF HYPOGLOSSAL NERVE STIMULATOR Right 07/30/2022   Procedure: IMPLANTATION OF HYPOGLOSSAL NERVE STIMULATOR;  Surgeon: Osborn Coho, MD;  Location: [facility];  Service: ENT;  Laterality: Right;   INTRAVASCULAR ULTRASOUND/IVUS N/A 08/15/2020   Procedure: Intravascular Ultrasound/IVUS;  Surgeon: Corky Crafts, MD;  Location: [facility] INVASIVE [facility] LAB;  Service: Cardiovascular;  Laterality: N/A;   LEFT HEART CATH AND CORONARY ANGIOGRAPHY N/A 08/15/2020   Procedure: LEFT HEART CATH AND CORONARY ANGIOGRAPHY;  Surgeon: Corky Crafts, MD;  Location: [facility] INVASIVE [facility] LAB;  Service: Cardiovascular;  Laterality: N/A;   LEFT HEART CATH AND CORONARY ANGIOGRAPHY N/A  01/17/2021   Procedure: LEFT HEART CATH AND CORONARY ANGIOGRAPHY;  Surgeon: Kathleene Hazel, MD;  Location: [facility] INVASIVE CV LAB;  Service: Cardiovascular;  Laterality: N/A;   LUMBAR FUSION     L3-5 with rods   MICRODISCECTOMY LUMBAR  2006   SPINAL CORD STIMULATOR INSERTION     blood clot removed from spinal cord   SPINE SURGERY     blood clot removed from spinal cord   XI ROBOTIC ASSISTED INGUINAL HERNIA REPAIR WITH MESH     x 3 surgeries    Current Medications: Current Meds  Medication Sig   acetaminophen (TYLENOL) 500 MG tablet Take 1,000 mg by mouth See admin instructions. Take 2 tablets (1000 mg) by mouth scheduled in the morning & may take 2 tablets (1000 mg) by mouth every 6 hours if needed for pain.   ARIPiprazole (ABILIFY) 10 MG tablet Take 1 tablet (10 mg total) by mouth every morning.   atorvastatin (LIPITOR) 80 MG tablet Take 1 tablet (80 mg total) by mouth daily.   b complex vitamins capsule Take 1 capsule by mouth in the morning.   buprenorphine (BUTRANS) 15 MCG/HR Place 1 patch onto the skin every Friday.   buPROPion (WELLBUTRIN XL) 300 MG 24 hr tablet Take 1 tablet (300 mg total) by mouth daily.   clopidogrel (PLAVIX) 75 MG tablet Take 1 tablet (75 mg total) by mouth daily with breakfast.   Continuous Blood Gluc Sensor (DEXCOM G6 SENSOR) MISC Use as directed. Change sensor every 10 days   docusate sodium (COLACE) 100 MG capsule Take 200 mg by mouth in the morning.   empagliflozin (JARDIANCE) 25 MG TABS tablet Take 1 tablet (25 mg total) by mouth daily.   GVOKE HYPOPEN 1-PACK 1 MG/0.2ML SOAJ Use as needed for hypoglycemia   HYDROcodone-acetaminophen (NORCO) 5-325 MG tablet Take 1 to 2 tablets by mouth every 12 hours as needed for postoperative pain.  Alternate every 6 hours with ibuprofen.   insulin lispro (HUMALOG) 100 UNIT/ML injection For use in pump, total of 60 units per day   Iron, Ferrous Sulfate, 325 (65 Fe) MG TABS Take 1 tablet (325 mg) by mouth daily.    isosorbide mononitrate (IMDUR) 30 MG 24 hr tablet Take 1 tablet (30 mg total) by mouth daily.   lidocaine (LIDODERM) 5 % Apply 1 - 3 patches on to the skin as directed. 12 hours on and 12 hours off.   magnesium oxide (MAG-OX) 400 (240 Mg) MG tablet Take 800 mg by mouth in the morning.   metoCLOPramide (REGLAN) 10 MG tablet Take 10 mg by mouth daily as needed for nausea or vomiting (upset stomach).   metoprolol succinate (TOPROL XL) 25 MG 24 hr tablet  Take 1 tablet (25 mg total) by mouth daily.   Na Sulfate-K Sulfate-Mg Sulf 17.5-3.13-1.6 GM/177ML SOLN Take by mouth as directed   nitroGLYCERIN (NITROSTAT) 0.4 MG SL tablet Place 0.4 mg under the tongue every 5 (five) minutes x 3 doses as needed for chest pain.   ondansetron (ZOFRAN-ODT) 4 MG disintegrating tablet Take 1 tablet (4 mg total) by mouth every 8 (eight) hours as needed for nausea or vomiting.   pantoprazole (PROTONIX) 40 MG tablet Take 1 tablet (40 mg total) by mouth daily.   Semaglutide, 2 MG/DOSE, (OZEMPIC, 2 MG/DOSE,) 8 MG/3ML SOPN Inject 2 mg into the skin once a week.   tamsulosin (FLOMAX) 0.4 MG CAPS capsule Take 1 capsule (0.4 mg total) by mouth daily.   testosterone (ANDROGEL) 50 MG/5GM (1%) GEL Place 5 g (1 packet) onto the shoulders and upper arms daily as directed.   tiZANidine (ZANAFLEX) 4 MG tablet Take 1-1&1/2 tablets (4-6 mg total) by mouth every 8 (eight) hours as needed for muscle spasms.     Allergies:   Kiwi extract, Other, Penicillins, Ancef [cefazolin], Latex, Levaquin [levofloxacin], and Peach [prunus persica]   Social History   Socioeconomic History   Marital status: Married    Spouse name: Not on file   Number of children: 4   Years of education: 16   Highest education level: Not on file  Occupational History   Occupation: disabled/ respiratory therapist  Tobacco Use   Smoking status: Former    Packs/day: 1.00    Years: 30.00    Total pack years: 30.00    Types: Cigarettes    Quit date: 2007    Years  since quitting: 17.0   Smokeless tobacco: Never  Vaping Use   Vaping Use: Never used  Substance and Sexual Activity   Alcohol use: Not Currently   Drug use: Not Currently   Sexual activity: Yes  Other Topics Concern   Not on file  Social History Narrative   Not on file   Social Determinants of Health   Financial Resource Strain: Not on file  Food Insecurity: No Food Insecurity (07/13/2022)   Hunger Vital Sign    Worried About Running Out of Food in the Last Year: Never true    Ran Out of Food in the Last Year: Never true  Transportation Needs: No Transportation Needs (07/13/2022)   PRAPARE - Administrator, Civil Service (Medical): No    Lack of Transportation (Non-Medical): No  Physical Activity: Not on file  Stress: Not on file  Social Connections: Not on file     Family History: The patient's family history includes Anxiety disorder in his father and mother; CAD in his brother; COPD in his mother; Cancer in his father; Colon cancer in his mother; Coronary artery disease in his father; Depression in his father and mother; Leukemia in his maternal grandmother; Multiple sclerosis in his daughter. There is no history of Diabetes.  ROS:   Please see the history of present illness.    ROS  All other systems reviewed and negative.   EKGs/Labs/Other Studies Reviewed:    The following studies were reviewed today: Split-night sleep study, BiPAP titration and Pap compliance download  EKG:  EKG is not ordered today.    Recent Labs: 07/13/2022: Magnesium 1.9 07/25/2022: ALT 11; BUN 8; Creat 0.70; Hemoglobin 15.9; Platelets 221; Potassium 3.9; Sodium 141   Recent Lipid Panel    Component Value Date/Time   CHOL 105 03/26/2022 0000   CHOL  113 10/23/2020 0824   TRIG 256 (H) 03/26/2022 0000   HDL 32 (L) 03/26/2022 0000   HDL 45 10/23/2020 0824   CHOLHDL 3.3 03/26/2022 0000   VLDL 22 11/01/2021 0334   LDLCALC 43 03/26/2022 0000    CHA2DS2-VASc Score =   [ ] .   Therefore, the patient's annual risk of stroke is   %.        Physical Exam:    VS:  BP (!) 96/58   Pulse 78   Ht 5\' 8"  (1.727 m)   Wt 189 lb (85.7 kg)   SpO2 95%   BMI 28.74 kg/m     Wt Readings from Last 3 Encounters:  09/04/22 189 lb (85.7 kg)  08/21/22 194 lb 6.4 oz (88.2 kg)  07/30/22 192 lb 7.4 oz (87.3 kg)    Well nourished, well developed male in no acute distress. Well appearing, alert and conversant, regular work of breathing,  good skin color  Eyes- anicteric mouth- oral mucosa is pink  neuro- grossly intact skin- no apparent rash or lesions or cyanosis.  Surgical sites from [facility] well healed with no drainage or erythema ASSESSMENT:    1. OSA (obstructive sleep apnea)   2. Primary hypertension    PLAN:    In order of problems listed above:   OSA   -He  really struggled with his BiPAP device and could not use it at night. -referred to ENT -s/p Inspire device 07/30/2022 by Dr. Wilburn Cornelia -Inspire device activation performed today in office with [facility] rep with following parameters:   2. Hypertension -BP controlled on exam today -continue prescription drug management with Imdur 30mg  daily, Toprol XL 25mg  daily with PRN refills  Time Spent: 20 minutes total time of encounter, including 10 minutes spent in face-to-face patient care on the date of this encounter. This time includes coordination of care and counseling regarding above mentioned problem list. Remainder of non-face-to-face time involved reviewing chart documents/testing relevant to the patient encounter and documentation in the medical record. I have independently reviewed documentation from referring provider  Medication Adjustments/Labs and Tests Ordered: Current medicines are reviewed at length with the patient today.  Concerns regarding medicines are outlined above.  No orders of the defined types were placed in this encounter.  No orders of the defined types were placed in this  encounter.   Signed, Fransico Him, MD  09/04/2022 9:39 AM    [place]

## 2022-09-13 ENCOUNTER — Other Ambulatory Visit: Payer: Self-pay | Admitting: Endocrinology

## 2022-09-13 ENCOUNTER — Other Ambulatory Visit (HOSPITAL_BASED_OUTPATIENT_CLINIC_OR_DEPARTMENT_OTHER): Payer: Self-pay

## 2022-09-13 MED ORDER — INSULIN LISPRO 100 UNIT/ML IJ SOLN
INTRAMUSCULAR | 3 refills | Status: DC
  Filled 2022-09-13: qty 50, 83d supply, fill #0
  Filled 2022-12-02: qty 50, 83d supply, fill #1
  Filled 2023-02-18: qty 50, 83d supply, fill #2
  Filled 2023-05-12: qty 50, 83d supply, fill #3

## 2022-09-16 ENCOUNTER — Other Ambulatory Visit (HOSPITAL_BASED_OUTPATIENT_CLINIC_OR_DEPARTMENT_OTHER): Payer: Self-pay

## 2022-09-24 ENCOUNTER — Other Ambulatory Visit (HOSPITAL_BASED_OUTPATIENT_CLINIC_OR_DEPARTMENT_OTHER): Payer: Self-pay

## 2022-09-24 ENCOUNTER — Other Ambulatory Visit: Payer: Self-pay

## 2022-09-26 ENCOUNTER — Encounter: Payer: Self-pay | Admitting: Family Medicine

## 2022-09-26 ENCOUNTER — Other Ambulatory Visit: Payer: Self-pay

## 2022-09-26 ENCOUNTER — Ambulatory Visit (INDEPENDENT_AMBULATORY_CARE_PROVIDER_SITE_OTHER): Payer: 59 | Admitting: Family Medicine

## 2022-09-26 ENCOUNTER — Other Ambulatory Visit (HOSPITAL_BASED_OUTPATIENT_CLINIC_OR_DEPARTMENT_OTHER): Payer: Self-pay

## 2022-09-26 VITALS — BP 94/56 | HR 85 | Ht 68.0 in | Wt 195.5 lb

## 2022-09-26 DIAGNOSIS — E291 Testicular hypofunction: Secondary | ICD-10-CM | POA: Diagnosis not present

## 2022-09-26 DIAGNOSIS — F3341 Major depressive disorder, recurrent, in partial remission: Secondary | ICD-10-CM | POA: Diagnosis not present

## 2022-09-26 DIAGNOSIS — K56609 Unspecified intestinal obstruction, unspecified as to partial versus complete obstruction: Secondary | ICD-10-CM | POA: Diagnosis not present

## 2022-09-26 DIAGNOSIS — E119 Type 2 diabetes mellitus without complications: Secondary | ICD-10-CM | POA: Diagnosis not present

## 2022-09-26 DIAGNOSIS — G4733 Obstructive sleep apnea (adult) (pediatric): Secondary | ICD-10-CM

## 2022-09-26 DIAGNOSIS — E1169 Type 2 diabetes mellitus with other specified complication: Secondary | ICD-10-CM | POA: Diagnosis not present

## 2022-09-26 DIAGNOSIS — I10 Essential (primary) hypertension: Secondary | ICD-10-CM | POA: Diagnosis not present

## 2022-09-26 DIAGNOSIS — Z794 Long term (current) use of insulin: Secondary | ICD-10-CM

## 2022-09-26 MED ORDER — BUPROPION HCL ER (XL) 150 MG PO TB24
150.0000 mg | ORAL_TABLET | Freq: Every day | ORAL | 1 refills | Status: DC
  Filled 2022-09-26: qty 90, 90d supply, fill #0
  Filled 2022-12-18 – 2022-12-27 (×2): qty 90, 90d supply, fill #1

## 2022-09-26 MED ORDER — TIRZEPATIDE 10 MG/0.5ML ~~LOC~~ SOAJ
10.0000 mg | SUBCUTANEOUS | 0 refills | Status: DC
  Filled 2022-09-26: qty 2, 28d supply, fill #0
  Filled 2022-10-21: qty 2, 28d supply, fill #1

## 2022-09-26 NOTE — Assessment & Plan Note (Signed)
Blood pressure is a little low today.  He denies symptoms of hypotension at this time.

## 2022-09-26 NOTE — Assessment & Plan Note (Signed)
Worsening depressive symptoms.  Adding an additional 150 mg of bupropion to his 300 mg that he is currently taking.

## 2022-09-26 NOTE — Assessment & Plan Note (Signed)
Diabetes has been managed by endocrinology.  He is on insulin pump.  He thinks that Ozempic may have contributed to adverse events including worsening gastroparesis as well as SBO.  He would like to try changing to something different.  Will see if Darcel Bayley is better tolerated.

## 2022-09-26 NOTE — Assessment & Plan Note (Signed)
He has had a couple small bowel obstructions.  He is on chronic opioids.  He believes that [employer] may have contributed to this as well and is involved in a class action lawsuit for this medication.

## 2022-09-26 NOTE — Progress Notes (Signed)
Brandon [name] - 67 y.o. male MRN [id number]  Date of birth: [birth year]  Subjective Chief Complaint  Patient presents with   Depression    HPI Brandon [name] is a 67 year old male here today for follow-up visit.  This is last visit with me he did have inspire device implanted.  Reports he is doing pretty well with his.  He is getting about 7 to 8 hours of sleep each night.  He does report that O2 sats have dropped while sleeping at night.  He tells me that he seeing a cardiologist that specializes in sleep medicine to review this.  He does report having some increased depressive symptoms.  He had a job during the holidays however this was seasonal and he reports that they decided not to keep him on.  He has been applying for several jobs however is becoming discouraged due to not hearing back from these applications.  He is taking bupropion 300 mg daily and Abilify.  He had been on 450 mg of bupropion previously.  He is seeing endocrinology for management of his diabetes.  Current management with insulin pump and Ozempic.  Reports her blood sugars have been pretty good.  He reports that he has added himself to a class action lawsuit for Ozempic.  He has had a couple small bowel obstructions which he thinks may have been caused by Ozempic.  He also feels like his gastroparesis may be worsening of this.  He does report that he continues on Ozempic despite having these possible adverse reactions.  ROS:  A comprehensive ROS was completed and negative except as noted per HPI  Allergies  Allergen Reactions   Kiwi Extract Swelling    Mouth swelling.    Other Swelling and Other (See Comments)    EGGPLANT. Mouth swelling.   Penicillins Rash    Reaction: unknown   Ancef [Cefazolin] Rash   Latex Rash   Levaquin [Levofloxacin] Rash   Peach [Prunus Persica] Other (See Comments)    Burns mouth    Past Medical History:  Diagnosis Date   Anxiety    Arthritis    Basal cell carcinoma (BCC) in situ of  skin    left neck   Chronic back pain    has spinal cord stimulator and wears buprenorphine patch   Colon polyps    Coronary artery disease    S/p DES to mRCA in 12/21 // S/p DES to LCx x 2 in 12/2020 // Diff dz in small to mod Dx (not a good target for PCI); OM1 100 CTO; severe dz in small OM2 and PDA >> Med Rx   Depression    Diabetes mellitus without complication (HCC)    type 2   Fibromyalgia    Gallstones    GERD (gastroesophageal reflux disease)    Hepatitis B    HLD (hyperlipidemia)    Hypertension    Osteoarthritis    Pneumonia    PONV (postoperative nausea and vomiting)    SBO (small bowel obstruction) ([place])    Sleep apnea    does not use CPAP    Past Surgical History:  Procedure Laterality Date   CARDIAC CATHETERIZATION  01/17/2021   CATARACT EXTRACTION W/ INTRAOCULAR LENS  IMPLANT, BILATERAL     CHOLECYSTECTOMY     CHOLECYSTECTOMY, LAPAROSCOPIC     CORONARY STENT INTERVENTION N/A 08/15/2020   Procedure: CORONARY STENT INTERVENTION;  Surgeon: Jettie Booze, MD;  Location: [facility];  Service: Cardiovascular;  Laterality: N/A;  CORONARY STENT INTERVENTION N/A 01/17/2021   Procedure: CORONARY STENT INTERVENTION;  Surgeon: Burnell Blanks, MD;  Location: [place] [facility];  Service: Cardiovascular;  Laterality: N/A;   DRUG INDUCED ENDOSCOPY N/A 05/23/2022   Procedure: DRUG INDUCED SLEEP ENDOSCOPY;  Surgeon: Jerrell Belfast, MD;  Location: [facility];  Service: ENT;  Laterality: N/A;   HERNIA REPAIR     IMPLANTATION OF HYPOGLOSSAL NERVE STIMULATOR Right 07/30/2022   Procedure: IMPLANTATION OF HYPOGLOSSAL NERVE STIMULATOR;  Surgeon: Jerrell Belfast, MD;  Location: [place];  Service: ENT;  Laterality: Right;   INTRAVASCULAR ULTRASOUND/IVUS N/A 08/15/2020   Procedure: Intravascular Ultrasound/IVUS;  Surgeon: Jettie Booze, MD;  Location: [facility];  Service: Cardiovascular;  Laterality: N/A;   LEFT HEART CATH AND  CORONARY ANGIOGRAPHY N/A 08/15/2020   Procedure: LEFT HEART CATH AND CORONARY ANGIOGRAPHY;  Surgeon: Jettie Booze, MD;  Location: [facility];  Service: Cardiovascular;  Laterality: N/A;   LEFT HEART CATH AND CORONARY ANGIOGRAPHY N/A 01/17/2021   Procedure: LEFT HEART CATH AND CORONARY ANGIOGRAPHY;  Surgeon: Burnell Blanks, MD;  Location: [place] [facility];  Service: Cardiovascular;  Laterality: N/A;   LUMBAR FUSION     L3-5 with rods   MICRODISCECTOMY LUMBAR  2006   SPINAL CORD STIMULATOR INSERTION     blood clot removed from spinal cord   SPINE SURGERY     blood clot removed from spinal cord   XI ROBOTIC ASSISTED INGUINAL HERNIA REPAIR WITH MESH     x 3 surgeries    Social History   Socioeconomic History   Marital status: Married    Spouse name: Not on file   Number of children: 4   Years of education: 16   Highest education level: Not on file  Occupational History   Occupation: disabled/ respiratory therapist  Tobacco Use   Smoking status: Former    Packs/day: 1.00    Years: 30.00    Total pack years: 30.00    Types: Cigarettes    Quit date: 2007    Years since quitting: 17.0   Smokeless tobacco: Never  Vaping Use   Vaping Use: Never used  Substance and Sexual Activity   Alcohol use: Not Currently   Drug use: Not Currently   Sexual activity: Yes  Other Topics Concern   Not on file  Social History Narrative   Not on file   Social Determinants of Health   Financial Resource Strain: Not on file  Food Insecurity: No Food Insecurity (07/13/2022)   Hunger Vital Sign    Worried About Running Out of Food in the Last Year: Never true    [place] in the Last Year: Never true  Transportation Needs: No Transportation Needs (07/13/2022)   PRAPARE - Hydrologist (Medical): No    Lack of Transportation (Non-Medical): No  Physical Activity: Not on file  Stress: Not on file  Social Connections: Not on file     Family History  Problem Relation Age of Onset   COPD Mother    Anxiety disorder Mother    Depression Mother    Colon cancer Mother    Cancer Father        mets, origin unknown   Anxiety disorder Father    Depression Father    Coronary artery disease Father    CAD Brother    Leukemia Maternal Grandmother    Multiple sclerosis Daughter    Diabetes Neg  Hx     Health Maintenance  Topic Date Due   DTaP/Tdap/Td (1 - Tdap) Never done   OPHTHALMOLOGY EXAM  09/26/2022 (Originally 04/24/2022)   COVID-19 Vaccine (3 - 2023-24 season) 09/26/2022 (Originally 04/26/2022)   Medicare Annual Wellness (AWV)  09/26/2022 (Originally 07-16-1956)   Zoster Vaccines- Shingrix (1 of 2) 10/24/2022 (Originally 09/13/2005)   COLONOSCOPY (Pts 45-68yrs Insurance coverage will need to be confirmed)  01/10/2023 (Originally 09/13/2000)   Hepatitis C Screening  01/10/2023 (Originally 09[birth year]13/1973)   HEMOGLOBIN A1C  11/11/2022   Diabetic kidney evaluation - Urine ACR  03/27/2023   FOOT EXAM  05/14/2023   Diabetic kidney evaluation - eGFR measurement  07/26/2023   Pneumonia Vaccine 47+ Years old  Completed   INFLUENZA VACCINE  Completed   HPV VACCINES  Aged Out     ----------------------------------------------------------------------------------------------------------------------------------------------------------------------------------------------------------------- Physical Exam BP (!) 94/56 (BP Location: Left Arm, Patient Position: Sitting, Cuff Size: Normal)   Pulse 85   Ht 5\' 8"  (1.727 m)   Wt 195 lb 8 oz (88.7 kg)   SpO2 96%   BMI 29.73 kg/m   Physical Exam Constitutional:      Appearance: Normal appearance.  HENT:     Head: Normocephalic and atraumatic.  Eyes:     General: No scleral icterus. Cardiovascular:     Rate and Rhythm: Normal rate and regular rhythm.  Pulmonary:     Effort: Pulmonary effort is normal.     Breath sounds: Normal breath sounds.  Musculoskeletal:     Cervical  back: Neck supple.  Neurological:     Mental Status: He is alert.  Psychiatric:        Mood and Affect: Mood normal.        Behavior: Behavior normal.     ------------------------------------------------------------------------------------------------------------------------------------------------------------------------------------------------------------------- Assessment and Plan  Primary hypertension Blood pressure is a little low today.  He denies symptoms of hypotension at this time.  Obstructive sleep apnea He recently had inspire device implanted.  Overall doing pretty well with this.  He does report nocturnal desaturations that were noted in the hospital previously as well as checking a pulse oximeter at home.  He reports having upcoming visit with cardiologist who specializes in sleep medicine as well.  Small bowel obstruction (HCC) He has had a couple small bowel obstructions.  He is on chronic opioids.  He believes that Ozempic may have contributed to this as well and is involved in a class action lawsuit for this medication.  Type 2 diabetes mellitus with other specified complication (HCC) Diabetes has been managed by endocrinology.  He is on insulin pump.  He thinks that Ozempic may have contributed to adverse events including worsening gastroparesis as well as SBO.  He would like to try changing to something different.  Will see if is better tolerated.  Major depression in partial remission (HCC) Worsening depressive symptoms.  Adding an additional 150 mg of bupropion to his 300 mg that he is currently taking.   Meds ordered this encounter  Medications   buPROPion (WELLBUTRIN XL) 150 MG 24 hr tablet    Sig: Take 1 tablet (150 mg total) by mouth daily in addition to 300mg  tablet.    Dispense:  90 tablet    Refill:  1   tirzepatide (MOUNJARO) 10 MG/0.5ML Pen    Sig: Inject 10 mg into the skin once a week.    Dispense:  6 mL    Refill:  0    Return in  about 1 month (around 10/25/2022)  for Mood/Mounjaro.    This visit occurred during the SARS-CoV-2 public health emergency.  Safety protocols were in place, including screening questions prior to the visit, additional usage of staff PPE, and extensive cleaning of exam room while observing appropriate contact time as indicated for disinfecting solutions.

## 2022-09-26 NOTE — Patient Instructions (Signed)
Stop Ozempic Change to mounjaro.  Increase bupropion to 450mg .  See me again in 4 weeks.

## 2022-09-26 NOTE — Assessment & Plan Note (Signed)
He recently had inspire device implanted.  Overall doing pretty well with this.  He does report nocturnal desaturations that were noted in the hospital previously as well as checking a pulse oximeter at home.  He reports having upcoming visit with cardiologist who specializes in sleep medicine as well.

## 2022-09-27 LAB — COMPLETE METABOLIC PANEL WITH GFR
AG Ratio: 1.7 (calc) (ref 1.0–2.5)
ALT: 11 U/L (ref 9–46)
AST: 16 U/L (ref 10–35)
Albumin: 4.3 g/dL (ref 3.6–5.1)
Alkaline phosphatase (APISO): 77 U/L (ref 35–144)
BUN: 11 mg/dL (ref 7–25)
CO2: 31 mmol/L (ref 20–32)
Calcium: 9.5 mg/dL (ref 8.6–10.3)
Chloride: 103 mmol/L (ref 98–110)
Creat: 0.75 mg/dL (ref 0.70–1.35)
Globulin: 2.5 g/dL (calc) (ref 1.9–3.7)
Glucose, Bld: 104 mg/dL — ABNORMAL HIGH (ref 65–99)
Potassium: 4 mmol/L (ref 3.5–5.3)
Sodium: 141 mmol/L (ref 135–146)
Total Bilirubin: 0.8 mg/dL (ref 0.2–1.2)
Total Protein: 6.8 g/dL (ref 6.1–8.1)
eGFR: 99 mL/min/{1.73_m2} (ref >=60)

## 2022-09-27 LAB — CBC WITH DIFFERENTIAL/PLATELET
Absolute Monocytes: 451 cells/uL (ref 200–950)
Basophils Absolute: 19 cells/uL (ref 0–200)
Basophils Relative: 0.4 %
Eosinophils Absolute: 72 cells/uL (ref 15–500)
Eosinophils Relative: 1.5 %
HCT: 41.7 % (ref 38.5–50.0)
Hemoglobin: 14.3 g/dL (ref 13.2–17.1)
Lymphs Abs: 1176 cells/uL (ref 850–3900)
MCH: 32.9 pg (ref 27.0–33.0)
MCHC: 34.3 g/dL (ref 32.0–36.0)
MCV: 95.9 fL (ref 80.0–100.0)
MPV: 10.5 fL (ref 7.5–12.5)
Monocytes Relative: 9.4 %
Neutro Abs: 3082 cells/uL (ref 1500–7800)
Neutrophils Relative %: 64.2 %
Platelets: 209 10*3/uL (ref 140–400)
RBC: 4.35 10*6/uL (ref 4.20–5.80)
RDW: 12.4 % (ref 11.0–15.0)
Total Lymphocyte: 24.5 %
WBC: 4.8 10*3/uL (ref 3.8–10.8)

## 2022-09-27 LAB — TESTOSTERONE: Testosterone: 427 ng/dL (ref 250–827)

## 2022-09-27 LAB — HEMOGLOBIN A1C
Hgb A1c MFr Bld: 6.6 % of total Hgb — ABNORMAL HIGH (ref <5.7)
Mean Plasma Glucose: 143 mg/dL
eAG (mmol/L): 7.9 mmol/L

## 2022-09-29 DIAGNOSIS — G4733 Obstructive sleep apnea (adult) (pediatric): Secondary | ICD-10-CM | POA: Diagnosis not present

## 2022-10-06 ENCOUNTER — Telehealth: Payer: Self-pay | Admitting: Cardiology

## 2022-10-06 ENCOUNTER — Other Ambulatory Visit: Payer: Self-pay | Admitting: Student in an Organized Health Care Education/Training Program

## 2022-10-06 DIAGNOSIS — G894 Chronic pain syndrome: Secondary | ICD-10-CM

## 2022-10-06 DIAGNOSIS — M4802 Spinal stenosis, cervical region: Secondary | ICD-10-CM

## 2022-10-06 DIAGNOSIS — M961 Postlaminectomy syndrome, not elsewhere classified: Secondary | ICD-10-CM

## 2022-10-06 NOTE — Telephone Encounter (Signed)
Patient contacted the answering service this AM reporting that his blood pressure is "in the toilet". Reports that his blood pressure this AM was 91/61 while sitting, but it decreased to 74/61 while sitting. He took his blood pressure while standing and it was down to 59/46. Patient complains of significant dizziness upon standing, and he feels very weak and fatigued. Feels like his legs are "heavy" and he can't do anything besides sit around. Patient denies syncope/near syncope while sitting down. Feels OK while sitting down. Patient reports that he has been eating OK, but he knows that he has not been drinking enough fluids. Discussed that given his Low BP and significant symptoms while standing, he needs to be evaluated in person, and encouraged him to go to an urgent care or ED for evaluation. Told him that he should not drive. Patient will ask his son to give him a ride and if his son is unable to transport him, patient knows that he should call EMS. Patient voiced understanding and agreement.   Margie Billet, PA-C 10/06/2022 10:05 AM

## 2022-10-07 ENCOUNTER — Encounter: Payer: Self-pay | Admitting: Cardiology

## 2022-10-07 ENCOUNTER — Ambulatory Visit: Payer: 59 | Attending: Cardiology | Admitting: Cardiology

## 2022-10-07 VITALS — BP 112/68 | Ht 68.0 in | Wt 188.0 lb

## 2022-10-07 DIAGNOSIS — I10 Essential (primary) hypertension: Secondary | ICD-10-CM | POA: Diagnosis not present

## 2022-10-07 DIAGNOSIS — G4733 Obstructive sleep apnea (adult) (pediatric): Secondary | ICD-10-CM | POA: Diagnosis not present

## 2022-10-07 NOTE — Progress Notes (Signed)
  Patient Consent for Virtual Visit        Brandon [name] has provided verbal consent on 10/07/2022 for a virtual visit (video or telephone).   CONSENT FOR VIRTUAL VISIT FOR:  Brandon [name]  By participating in this virtual visit I agree to the following:  I hereby voluntarily request, consent and authorize [facility] and its employed or contracted physicians, physician assistants, nurse practitioners or other licensed health care professionals (the Practitioner), to provide me with telemedicine health care services (the "Services") as deemed necessary by the treating Practitioner. I acknowledge and consent to receive the Services by the Practitioner via telemedicine. I understand that the telemedicine visit will involve communicating with the Practitioner through live audiovisual communication technology and the disclosure of certain medical information by electronic transmission. I acknowledge that I have been given the opportunity to request an in-person assessment or other available alternative prior to the telemedicine visit and am voluntarily participating in the telemedicine visit.  I understand that I have the right to withhold or withdraw my consent to the use of telemedicine in the course of my care at any time, without affecting my right to future care or treatment, and that the Practitioner or I may terminate the telemedicine visit at any time. I understand that I have the right to inspect all information obtained and/or recorded in the course of the telemedicine visit and may receive copies of available information for a reasonable fee.  I understand that some of the potential risks of receiving the Services via telemedicine include:  Delay or interruption in medical evaluation due to technological equipment failure or disruption; Information transmitted may not be sufficient (e.g. poor resolution of images) to allow for appropriate medical decision making by the Practitioner;  and/or  In rare instances, security protocols could fail, causing a breach of personal health information.  Furthermore, I acknowledge that it is my responsibility to provide information about my medical history, conditions and care that is complete and accurate to the best of my ability. I acknowledge that Practitioner's advice, recommendations, and/or decision may be based on factors not within their control, such as incomplete or inaccurate data provided by me or distortions of diagnostic images or specimens that may result from electronic transmissions. I understand that the practice of medicine is not an exact science and that Practitioner makes no warranties or guarantees regarding treatment outcomes. I acknowledge that a copy of this consent can be made available to me via my patient portal ([facility]), or I can request a printed copy by calling the office of [facility].    I understand that my insurance will be billed for this visit.   I have read or had this consent read to me. I understand the contents of this consent, which adequately explains the benefits and risks of the Services being provided via telemedicine.  I have been provided ample opportunity to ask questions regarding this consent and the Services and have had my questions answered to my satisfaction. I give my informed consent for the services to be provided through the use of telemedicine in my medical care

## 2022-10-07 NOTE — Patient Instructions (Signed)
Medication Instructions:  Your physician recommends that you continue on your current medications as directed. Please refer to the Current Medication list given to you today.  *If you need a refill on your cardiac medications before your next appointment, please call your pharmacy*   Follow-Up: At [facility], you and your health needs are our priority.  As part of our continuing mission to provide you with exceptional heart care, we have created designated Provider Care Teams.  These Care Teams include your primary Cardiologist (physician) and Advanced Practice Providers (APPs -  Physician Assistants and Nurse Practitioners) who all work together to provide you with the care you need, when you need it.  Your next appointment:   As scheduled  Provider:   Dr. Radford Pax

## 2022-10-07 NOTE — Progress Notes (Signed)
SLEEP MEDICINE VIRTUAL VISIT  via Video Note   Because of Brandon [name]'s co-morbid illnesses, he is at least at moderate risk for complications without adequate follow up.  This format is felt to be most appropriate for this patient at this time.  All issues noted in this document were discussed and addressed.  A limited physical exam was performed with this format.  Please refer to the patient's chart for his consent to telehealth for Brandon [name].   Date:  10/07/2022   ID:  Brandon [name], DOB October 12, 1955, MRN [id number] The patient was identified using 2 identifiers.  Patient Location: Home Provider Location: Home Office   PCP:  Luetta Nutting, DO   [facility] Providers Cardiologist:  Freada Bergeron, MD Sleep Medicine:  Fransico Him, MD     Evaluation Performed:  Follow-Up Visit Chief Complaint:  OSA  History of Present Illness:    Brandon [name] is a 67 y.o. male with  a hx of CAD, hypertension, diabetes who was referred for sleep study testing by Dr. Johney Frame.  He has a history of obstructive sleep apnea the past but stopped using his CPAP and was referred back for repeat sleep study to get back on CPAP therapy.  He underwent split-night sleep study in June 2022 which revealed severe obstructive sleep apnea with an AHI of 45.7/h and O2 saturations as low as 54% consistent with nocturnal hypoxemia.  He failed CPAP therapy due to ongoing events and underwent BiPAP titration and was placed on auto BiPAP with IPAP max 20 cm H2O, EPAP min 5 cm H2O and pressure support 4 cm H2O   He really struggled with auto BiPAP device.  He was having problems waking up about 4 hours after starting to sleep because his mask was leaking and he would take it off.  He was having significant problems with the mask really leaking and so we ordered him new PAP supplies including a new nasal cushion with chin strap as he had not changed his cushion out since he got his device.      He was referred to ENT and ultimately underwent Inspire device implant 07/30/22 by Dr. Wilburn Cornelia.  He was seen 09/04/2022 and his inspire device was activated.  His amplitude was set at 1.2 V with a control of 1.2 to 2.2 V.  He is now back for 4-week virtual check-in.  He is doing well with his Inspire device.  He is sleeping 7-9 hours nightly.  He has a very mild sore throat when he wakes up in the am but goes away quickly when he get up.  He feels more rested than he used to but still feels sleepy first thing in the am and takes a while to get doing.  He still gets sleepy during the day that is about the same as prior to [facility] and if he sits too long he will fall asleep.  He is on level 7 which is 1.6V.   Past Medical History:  Diagnosis Date   Anxiety    Arthritis    Basal cell carcinoma (BCC) in situ of skin    left neck   Chronic back pain    has spinal cord stimulator and wears buprenorphine patch   Colon polyps    Coronary artery disease    S/p DES to mRCA in 12/21 // S/p DES to LCx x 2 in 12/2020 // Diff dz in small to mod Dx (not  a good target for PCI); OM1 100 CTO; severe dz in small OM2 and PDA >> Med Rx   Depression    Diabetes mellitus without complication (HCC)    type 2   Fibromyalgia    Gallstones    GERD (gastroesophageal reflux disease)    Hepatitis B    HLD (hyperlipidemia)    Hypertension    Osteoarthritis    Pneumonia    PONV (postoperative nausea and vomiting)    SBO (small bowel obstruction) (HCC)    Sleep apnea    does not use CPAP   Past Surgical History:  Procedure Laterality Date   CARDIAC CATHETERIZATION  01/17/2021   CATARACT EXTRACTION W/ INTRAOCULAR LENS  IMPLANT, BILATERAL     CHOLECYSTECTOMY     CHOLECYSTECTOMY, LAPAROSCOPIC     CORONARY STENT INTERVENTION N/A 08/15/2020   Procedure: CORONARY STENT INTERVENTION;  Surgeon: Jettie Booze, MD;  Location: [place] [facility];  Service: Cardiovascular;  Laterality: N/A;   CORONARY STENT  INTERVENTION N/A 01/17/2021   Procedure: CORONARY STENT INTERVENTION;  Surgeon: Burnell Blanks, MD;  Location: [place] [facility];  Service: Cardiovascular;  Laterality: N/A;   DRUG INDUCED ENDOSCOPY N/A 05/23/2022   Procedure: DRUG INDUCED SLEEP ENDOSCOPY;  Surgeon: Jerrell Belfast, MD;  Location: [place];  Service: ENT;  Laterality: N/A;   HERNIA REPAIR     IMPLANTATION OF HYPOGLOSSAL NERVE STIMULATOR Right 07/30/2022   Procedure: IMPLANTATION OF HYPOGLOSSAL NERVE STIMULATOR;  Surgeon: Jerrell Belfast, MD;  Location: [place];  Service: ENT;  Laterality: Right;   INTRAVASCULAR ULTRASOUND/IVUS N/A 08/15/2020   Procedure: Intravascular Ultrasound/IVUS;  Surgeon: Jettie Booze, MD;  Location: [place] [facility];  Service: Cardiovascular;  Laterality: N/A;   LEFT HEART CATH AND CORONARY ANGIOGRAPHY N/A 08/15/2020   Procedure: LEFT HEART CATH AND CORONARY ANGIOGRAPHY;  Surgeon: Jettie Booze, MD;  Location: [place] [facility];  Service: Cardiovascular;  Laterality: N/A;   LEFT HEART CATH AND CORONARY ANGIOGRAPHY N/A 01/17/2021   Procedure: LEFT HEART CATH AND CORONARY ANGIOGRAPHY;  Surgeon: Burnell Blanks, MD;  Location: [place] [facility];  Service: Cardiovascular;  Laterality: N/A;   LUMBAR FUSION     L3-5 with rods   MICRODISCECTOMY LUMBAR  2006   SPINAL CORD STIMULATOR INSERTION     blood clot removed from spinal cord   SPINE SURGERY     blood clot removed from spinal cord   XI ROBOTIC ASSISTED INGUINAL HERNIA REPAIR WITH MESH     x 3 surgeries     No outpatient medications have been marked as taking for the 10/07/22 encounter (Video Visit) with Brandon Margarita, MD.     Allergies:   Kiwi extract, Other, Penicillins, Ancef [cefazolin], Latex, Levaquin [levofloxacin], and Peach [prunus persica]   Social History   Tobacco Use   Smoking status: Former    Packs/day: 1.00    Years: 30.00    Total pack years: 30.00    Types: Cigarettes     Quit date: 2007    Years since quitting: 17.1   Smokeless tobacco: Never  Vaping Use   Vaping Use: Never used  Substance Use Topics   Alcohol use: Not Currently   Drug use: Not Currently     Family Hx: The patient's family history includes Anxiety disorder in his father and mother; CAD in his brother; COPD in his mother; Cancer in his father; Colon cancer in his mother; Coronary artery disease in his father; Depression in  his father and mother; Leukemia in his maternal grandmother; Multiple sclerosis in his daughter. There is no history of Diabetes.  ROS:   Please see the history of present illness.     All other systems reviewed and are negative.   Prior Sleep studies:   The following studies were reviewed today:  None  Labs/Other Tests and Data Reviewed:     Recent Labs: 07/13/2022: Magnesium 1.9 09/26/2022: ALT 11; BUN 11; Creat 0.75; Hemoglobin 14.3; Platelets 209; Potassium 4.0; Sodium 141   Wt Readings from Last 3 Encounters:  10/07/22 188 lb (85.3 kg)  09/26/22 195 lb 8 oz (88.7 kg)  09/04/22 189 lb (85.7 kg)     Risk Assessment/Calculations:      Objective:    Vital Signs:  BP 112/68   Ht 5' 8"$  (1.727 m)   Wt 188 lb (85.3 kg)   BMI 28.59 kg/m    VITAL SIGNS:  reviewed GEN:  no acute distress EYES:  sclerae anicteric, EOMI - Extraocular Movements Intact RESPIRATORY:  normal respiratory effort, symmetric expansion CARDIOVASCULAR:  no peripheral edema SKIN:  no rash, lesions or ulcers. MUSCULOSKELETAL:  no obvious deformities. NEURO:  alert and oriented x 3, no obvious focal deficit PSYCH:  normal affect  ASSESSMENT & PLAN:    OSA   -He  really struggled with his BiPAP device and could not use it at night. -referred to ENT -s/p Inspire device 07/30/2022 by Dr. Wilburn Cornelia -Inspire device activation performed 09/04/22 with Inspire rep with following parameters:             -Initial activation waveform looked good.             -Stimulation levels               -Final settings include : -amplitude 1.2 V with the patient control of 1.2 to 2.2 V -Pulse width 90 s -Rate 33 Hz -Start delay 30 minutes -Pause time 15 minutes -Therapy duration 10 hours -Electrode configuration  A +/-/+ -He he is doing well at virtual 4-week follow-up.  He is using his device at least 7-9hours nightly.  Currently on level 7 which is 1.6V.  He has very minimal sore throat in the am that quickly goes away.  -He will have an in office visit with the inspire rep in 4 weeks followed by a in lab inspire titration in 6 weeks -I think we need to consider extending the start delay out to 45 min because sometimes he watches TV and if he does not turn it on then he falls asleep and it does not  get turned on.   Hypertension -BP is controlled -Continue prescription drug management with Imdur 30 mg daily and Toprol-XL 25 mg daily with as needed refills  Total time of encounter: 20 minutes total time of encounter, including 15 minutes spent in face-to-face patient care on the date of this encounter. This time includes coordination of care and counseling regarding above mentioned problem list. Remainder of non-face-to-face time involved reviewing chart documents/testing relevant to the patient encounter and documentation in the medical record. I have independently reviewed documentation from referring provider.    Medication Adjustments/Labs and Tests Ordered: Current medicines are reviewed at length with the patient today.  Concerns regarding medicines are outlined above.   Tests Ordered: No orders of the defined types were placed in this encounter.   Medication Changes: No orders of the defined types were placed in this encounter.   Follow Up:  In  Person in 4 week(s) with Inspire Rep  Signed, Fransico Him, MD  10/07/2022 10:36 AM    [place]

## 2022-10-15 ENCOUNTER — Ambulatory Visit
Payer: 59 | Attending: Student in an Organized Health Care Education/Training Program | Admitting: Student in an Organized Health Care Education/Training Program

## 2022-10-15 ENCOUNTER — Other Ambulatory Visit (HOSPITAL_BASED_OUTPATIENT_CLINIC_OR_DEPARTMENT_OTHER): Payer: Self-pay

## 2022-10-15 ENCOUNTER — Encounter: Payer: Self-pay | Admitting: Student in an Organized Health Care Education/Training Program

## 2022-10-15 DIAGNOSIS — G894 Chronic pain syndrome: Secondary | ICD-10-CM

## 2022-10-15 DIAGNOSIS — M4802 Spinal stenosis, cervical region: Secondary | ICD-10-CM | POA: Diagnosis not present

## 2022-10-15 DIAGNOSIS — M961 Postlaminectomy syndrome, not elsewhere classified: Secondary | ICD-10-CM

## 2022-10-15 MED ORDER — BUPRENORPHINE 15 MCG/HR TD PTWK
1.0000 | MEDICATED_PATCH | TRANSDERMAL | 2 refills | Status: DC
  Filled 2022-10-15: qty 4, 28d supply, fill #0
  Filled 2022-11-19: qty 4, 28d supply, fill #1

## 2022-10-15 NOTE — Progress Notes (Signed)
PROVIDER NOTE: Information contained herein reflects review and annotations entered in association with encounter. Interpretation of such information and data should be left to medically-trained personnel. Information provided to patient can be located elsewhere in the medical record under "Patient Instructions". Document created using STT-dictation technology, any transcriptional errors that may result from process are unintentional.    Patient: Brandon [name]  Service Category: E/M  Provider: Gillis Santa, MD  DOB: 07-31-1956  DOS: 10/15/2022  Referring Provider: Luetta Nutting, DO  MRN: [id number]  Specialty: Interventional Pain Management  PCP: Luetta Nutting, DO  Type: Established Patient  Setting: Ambulatory outpatient    Location: Office  Delivery: Face-to-face     HPI  [name], a 67 y.o. year old male, is here today because of his No primary diagnosis found.. Mr. [place]ne primary complain today is Back Pain (Lumbar right ) Last encounter: My last encounter with him was on 07/23/22 Pertinent problems: Mr. Delo has Type 2 diabetes mellitus with other specified complication ([place]); Lumbar facet arthropathy; Coronary artery disease; Acute pain of left shoulder; Sacroiliac joint disease; Chronic pain syndrome; History of lumbar fusion (L4/5); Failed back surgical syndrome; and Pain management contract signed on their pertinent problem list. Pain Assessment: Severity of Chronic pain is reported as a 4 /10. Location: Back Lower, Right/denies. Onset: More than a month ago. Quality: Discomfort, Constant, Stabbing. Timing: Constant. Modifying factor(s): not moving, patches. Vitals:  height is 5' 8"$  (1.727 m) and weight is 188 lb (85.3 kg). His temporal temperature is 98.4 F (36.9 C). His blood pressure is 171/77 (abnormal) and his pulse is 73. His respiration is 16 and oxygen saturation is 100%.   Reason for encounter: medication management.   Continues to have right lumbar spine pain,  history of lumbar facet arthropathy and lumbar spine fusion Status post implantation of hypoglossal stimulator with ENT for obstructive sleep apnea management. He states it is helping and he is sleeping 7-8 hrs at night with uninterrupted sleep. Presents today for medication refill of Butrans.  He states that it is helpful in managing his pain.     Pharmacotherapy Assessment  Analgesic: Butrans 25mg/hr   Monitoring: [place] PMP: PDMP reviewed during this encounter.       Pharmacotherapy: No side-effects or adverse reactions reported. Compliance: No problems identified. Effectiveness: Clinically acceptable.  PJanett Billow RN  10/15/2022 10:19 AM  Sign when Signing Visit Nursing Pain Medication Assessment:  Safety precautions to be maintained throughout the outpatient stay will include: orient to surroundings, keep bed in low position, maintain call bell within reach at all times, provide assistance with transfer out of bed and ambulation.  Medication Inspection Compliance: Mr. MYohodid not comply with our request to bring his pills to be counted. He was reminded that bringing the medication bottles, even when empty, is a requirement.  Medication: None brought in. Pill/Patch Count: None available to be counted. Bottle Appearance: No container available. Did not bring bottle(s) to appointment. Filled Date: N/A Last Medication intake:   has his last patch on.     No results found for: "CBDTHCR" No results found for: "D8THCCBX" No results found for: "D9THCCBX"  UDS:  Summary  Date Value Ref Range Status  10/02/2021 Note  Final    Comment:    ==================================================================== Compliance Drug Analysis, Ur ==================================================================== Test                             Result  Flag       Units  Drug Present and Declared for Prescription Verification   Buprenorphine                  8            EXPECTED    ng/mg creat   Norbuprenorphine               6            EXPECTED   ng/mg creat    Source of buprenorphine is a scheduled prescription medication.    Norbuprenorphine is an expected metabolite of buprenorphine.    Tizanidine                     PRESENT      EXPECTED   Bupropion                      PRESENT      EXPECTED   Hydroxybupropion               PRESENT      EXPECTED    Hydroxybupropion is an expected metabolite of bupropion.    Aripiprazole                   PRESENT      EXPECTED   Metoprolol                     PRESENT      EXPECTED  Drug Present not Declared for Prescription Verification   Acetaminophen                  PRESENT      UNEXPECTED  Drug Absent but Declared for Prescription Verification   Alpha-hydroxytriazolam         Not Detected UNEXPECTED ng/mg creat   Tramadol                       Not Detected UNEXPECTED ng/mg creat   Trazodone                      Not Detected UNEXPECTED   Salicylate                     Not Detected UNEXPECTED    Aspirin, as indicated in the declared medication list, is not always    detected even when used as directed.    Diphenhydramine                Not Detected UNEXPECTED   Lidocaine                      Not Detected UNEXPECTED    Lidocaine, as indicated in the declared medication list, is not    always detected even when used as directed.  ==================================================================== Test                      Result    Flag   Units      Ref Range   Creatinine              99               mg/dL      >=20 ==================================================================== Declared Medications:  The flagging and interpretation on this report are based on the  following declared medications.  Unexpected results  may arise from  inaccuracies in the declared medications.   **Note: The testing scope of this panel includes these medications:   Aripiprazole (Abilify)  Bupropion (Wellbutrin)   Diphenhydramine  Metoprolol (Toprol)  Tramadol (Ultram)  Trazodone (Desyrel)  Triazolam (Halcion)   **Note: The testing scope of this panel does not include small to  moderate amounts of these reported medications:   Aspirin  Buprenorphine Patch (BuTrans)  Lidocaine  Tizanidine (Zanaflex)  Topical Lidocaine (Lidoderm)   **Note: The testing scope of this panel does not include the  following reported medications:   Amlodipine (Norvasc)  Atorvastatin (Lipitor)  Clopidogrel (Plavix)  Empagliflozin (Jardiance)  Insulin (Humalog)  Isosorbide (Imdur)  Losartan (Cozaar)  Metoclopramide (Reglan)  Nitroglycerin (Nitrostat)  Nystatin (Mycostatin)  Ondansetron (Zofran)  Pantoprazole (Protonix)  Prednisolone  Semaglutide (Ozempic)  Tamsulosin (Flomax)  Testosterone ==================================================================== For clinical consultation, please call [phone]. ====================================================================       ROS  Constitutional: Denies any fever or chills Gastrointestinal: No reported hemesis, hematochezia, vomiting, or acute GI distress Musculoskeletal: Denies any acute onset joint swelling, redness, loss of ROM, or weakness Neurological: No reported episodes of acute onset apraxia, aphasia, dysarthria, agnosia, amnesia, paralysis, loss of coordination, or loss of consciousness  Medication Review  ARIPiprazole, Dexcom G6 Sensor, Glucagon, Na Sulfate-K Sulfate-Mg Sulf, acetaminophen, atorvastatin, b complex vitamins, buPROPion, buprenorphine, clopidogrel, docusate sodium, empagliflozin, ferrous sulfate, insulin lispro, isosorbide mononitrate, lidocaine, magnesium oxide, metoCLOPramide, metoprolol succinate, nitroGLYCERIN, ondansetron, pantoprazole, tamsulosin, testosterone, tiZANidine, and tirzepatide  History Review  Allergy: Mr. Gaul is allergic to kiwi extract, other, penicillins, ancef [cefazolin], latex, levaquin  [levofloxacin], and peach [prunus persica]. Drug: Mr. Mangat  reports that he does not currently use drugs. Alcohol:  reports that he does not currently use alcohol. Tobacco:  reports that he quit smoking about 17 years ago. His smoking use included cigarettes. He has a 30.00 pack-year smoking history. He has never used smokeless tobacco. Social: Mr. Ghattas  reports that he quit smoking about 17 years ago. His smoking use included cigarettes. He has a 30.00 pack-year smoking history. He has never used smokeless tobacco. He reports that he does not currently use alcohol. He reports that he does not currently use drugs. Medical:  has a past medical history of Anxiety, Arthritis, Basal cell carcinoma (BCC) in situ of skin, Chronic back pain, Colon polyps, Coronary artery disease, Depression, Diabetes mellitus without complication ([place]), Fibromyalgia, Gallstones, GERD (gastroesophageal reflux disease), Hepatitis B, HLD (hyperlipidemia), Hypertension, Osteoarthritis, Pneumonia, PONV (postoperative nausea and vomiting), SBO (small bowel obstruction) ([place]), and Sleep apnea. Surgical: Mr. Gordin  has a past surgical history that includes Spinal cord stimulator insertion; Cholecystectomy, laparoscopic; Cataract extraction w/ intraocular lens  implant, bilateral; Lumbar fusion; XI Robotic assisted inguinal hernia repair with mesh; LEFT HEART CATH AND CORONARY ANGIOGRAPHY (N/A, 08/15/2020); Intravascular Ultrasound/IVUS (N/A, 08/15/2020); CORONARY STENT INTERVENTION (N/A, 08/15/2020); Cardiac catheterization (01/17/2021); LEFT HEART CATH AND CORONARY ANGIOGRAPHY (N/A, 01/17/2021); CORONARY STENT INTERVENTION (N/A, 01/17/2021); Microdiscectomy lumbar (2006); Spine surgery; Hernia repair; Drug induced endoscopy (N/A, 05/23/2022); Cholecystectomy; and Implantation of hypoglossal nerve stimulator (Right, 07/30/2022). Family: family history includes Anxiety disorder in his father and mother; CAD in his brother; COPD in his  mother; Cancer in his father; Colon cancer in his mother; Coronary artery disease in his father; Depression in his father and mother; Leukemia in his maternal grandmother; Multiple sclerosis in his daughter.  Laboratory Chemistry Profile   Renal Lab Results  Component Value Date   BUN 11 09/26/2022   CREATININE 0.75 09/26/2022  BCR SEE NOTE: 09/26/2022   GFRAA >60 04/17/2020   GFRNONAA >60 07/13/2022    Hepatic Lab Results  Component Value Date   AST 16 09/26/2022   ALT 11 09/26/2022   ALBUMIN 3.6 07/13/2022   ALKPHOS 56 07/13/2022   LIPASE 37 07/12/2022    Electrolytes Lab Results  Component Value Date   NA 141 09/26/2022   K 4.0 09/26/2022   CL 103 09/26/2022   CALCIUM 9.5 09/26/2022   MG 1.9 07/13/2022   PHOS 3.2 07/13/2022    Bone Lab Results  Component Value Date   TESTOSTERONE 427 09/26/2022    Inflammation (CRP: Acute Phase) (ESR: Chronic Phase) Lab Results  Component Value Date   CRP 1.5 (H) 08/13/2020   ESRSEDRATE 17 (H) 08/13/2020   LATICACIDVEN 1.0 07/12/2022         Note: Above Lab results reviewed.  Recent Imaging Review  DG Neck Soft Tissue CLINICAL DATA:  Status post hypoglossal nerve stimulator placement.  EXAM: NECK SOFT TISSUES - 1+ VIEW  COMPARISON:  None Available.  FINDINGS: There is no evidence of retropharyngeal soft tissue swelling or epiglottic enlargement. The cervical airway is unremarkable. Stimulator lead is seen in grossly good position in the submandibular region.  IMPRESSION: Stimulator leads seen in grossly good position.  Electronically Signed   By: Marijo Conception M.D.   On: 07/30/2022 16:12 X-ray chest PA or AP CLINICAL DATA:  Postoperative check status post hypoglossal nerve stimulator.  EXAM: CHEST  1 VIEW  COMPARISON:  Chest x-ray 12/29/2021  FINDINGS: Right-sided generator overlies the right chest with wire extending into the right neck, new from prior. Thoracic spinal cord stimulator device is  unchanged in position. There is linear atelectasis in the lung bases. The lungs are otherwise clear. There is no pleural effusion or pneumothorax. The cardiomediastinal silhouette is within normal limits. No acute fractures are seen.  IMPRESSION: New right-sided hypoglossal nerve stimulator with wire extending into the right neck.  No acute cardiopulmonary process.  Electronically Signed   By: Ronney Asters M.D.   On: 07/30/2022 16:11 Note: Reviewed        Physical Exam  General appearance: Well nourished, well developed, and well hydrated. In no apparent acute distress Mental status: Alert, oriented x 3 (person, place, & time)       Respiratory: No evidence of acute respiratory distress Eyes: PERLA Vitals: BP (!) 171/77 (BP Location: Right Arm, Patient Position: Sitting, Cuff Size: Normal)   Pulse 73   Temp 98.4 F (36.9 C) (Temporal)   Resp 16   Ht 5' 8"$  (1.727 m)   Wt 188 lb (85.3 kg)   SpO2 100%   BMI 28.59 kg/m  BMI: Estimated body mass index is 28.59 kg/m as calculated from the following:   Height as of this encounter: 5' 8"$  (1.727 m).   Weight as of this encounter: 188 lb (85.3 kg). Ideal: Ideal body weight: 68.4 kg (150 lb 12.7 oz) Adjusted ideal body weight: 75.1 kg (165 lb 10.8 oz)  Cervical Spine Area Exam  Skin & Axial Inspection: No masses, redness, edema, swelling, or associated skin lesions Alignment: Symmetrical Functional ROM: Pain restricted ROM, to the right Stability: No instability detected Muscle Tone/Strength: Functionally intact. No obvious neuro-muscular anomalies detected. Sensory (Neurological): Dermatomal pain pattern Palpation: No palpable anomalies               Upper Extremity (UE) Exam      Side: Right upper extremity   Side: Left  upper extremity  Skin & Extremity Inspection: Skin color, temperature, and hair growth are WNL. No peripheral edema or cyanosis. No masses, redness, swelling, asymmetry, or associated skin lesions. No  contractures.   Skin & Extremity Inspection: Skin color, temperature, and hair growth are WNL. No peripheral edema or cyanosis. No masses, redness, swelling, asymmetry, or associated skin lesions. No contractures.  Functional ROM: Pain restricted ROM           Functional ROM: Unrestricted ROM          Muscle Tone/Strength: Functionally intact. No obvious neuro-muscular anomalies detected.   Muscle Tone/Strength: Functionally intact. No obvious neuro-muscular anomalies detected.  Sensory (Neurological): Dermatomal pain pattern           Sensory (Neurological): Unimpaired          Palpation: No palpable anomalies               Palpation: No palpable anomalies              Provocative Test(s):  Phalen's test: deferred Tinel's test: deferred Apley's scratch test (touch opposite shoulder):  Action 1 (Across chest): Decreased ROM Action 2 (Overhead): Decreased ROM Action 3 (LB reach): Decreased ROM     Provocative Test(s):  Phalen's test: deferred Tinel's test: deferred Apley's scratch test (touch opposite shoulder):  Action 1 (Across chest): deferred Action 2 (Overhead): deferred Action 3 (LB reach): deferred          Thoracic Spine Area Exam  Skin & Axial Inspection: Well healed scar from previous spine surgery detected Alignment: Symmetrical Functional ROM: Pain restricted ROM Stability: No instability detected Muscle Tone/Strength: Functionally intact. No obvious neuro-muscular anomalies detected. Sensory (Neurological): Neurogenic pain pattern Muscle strength & Tone: No palpable anomalies   Lumbar Spine Area Exam  Skin & Axial Inspection: Well healed scar from previous spine surgery detected Alignment: Symmetrical Functional ROM: Pain restricted ROM affecting both sides Stability: No instability detected Muscle Tone/Strength: Functionally intact. No obvious neuro-muscular anomalies detected. Sensory (Neurological): Dermatomal pain pattern + Lumbar facet extension positive for  pain consistent with facet syndrome Palpation: IPG present     Lower Extremity Exam      Side: Right lower extremity   Side: Left lower extremity  Stability: No instability observed           Stability: No instability observed          Skin & Extremity Inspection: Skin color, temperature, and hair growth are WNL. No peripheral edema or cyanosis. No masses, redness, swelling, asymmetry, or associated skin lesions. No contractures.   Skin & Extremity Inspection: Skin color, temperature, and hair growth are WNL. No peripheral edema or cyanosis. No masses, redness, swelling, asymmetry, or associated skin lesions. No contractures.  Functional ROM: Pain restricted ROM for hip and knee joints           Functional ROM: Pain restricted ROM for hip and knee joints          Muscle Tone/Strength: Functionally intact. No obvious neuro-muscular anomalies detected.   Muscle Tone/Strength: Functionally intact. No obvious neuro-muscular anomalies detected.  Sensory (Neurological): Arthropathic arthralgia         Sensory (Neurological): Arthropathic arthralgia        DTR: Patellar: deferred today Achilles: deferred today Plantar: deferred today   DTR: Patellar: deferred today Achilles: deferred today Plantar: deferred today  Palpation: No palpable anomalies   Palpation: No palpable anomalies       Assessment   Diagnosis Status  1. Chronic  pain syndrome   2. Post laminectomy syndrome   3. Neuroforaminal stenosis of cervical spine     Controlled Controlled Controlled    Plan of Care  Problem-specific:  No problem-specific Assessment & Plan notes found for this encounter.  Mr. ARTIST ZLOTNICK has a current medication list which includes the following long-term medication(s): aripiprazole, atorvastatin, bupropion, bupropion, insulin lispro, iron (ferrous sulfate), isosorbide mononitrate, metoclopramide, metoprolol succinate, pantoprazole, testosterone, and tizanidine.  Pharmacotherapy (Medications  Ordered): Meds ordered this encounter  Medications   buprenorphine (BUTRANS) 15 MCG/HR    Sig: Place 1 patch onto the skin every Friday.    Dispense:  4 patch    Refill:  2    Chronic Pain: STOP Act (Not applicable) Fill 1 day early if closed on refill date. Avoid benzodiazepines within 8 hours of opioids   Orders:  No orders of the defined types were placed in this encounter.  Follow-up plan:   Return in about 3 months (around 01/13/2023) for Medication Management, in person.    Recent Visits Date Type Provider Dept  07/23/22 Office Visit Gillis Santa, MD [facility]  Showing recent visits within past 90 days and meeting all other requirements Today's Visits Date Type Provider Dept  10/15/22 Office Visit Gillis Santa, MD [facility]  Showing today's visits and meeting all other requirements Future Appointments No visits were found meeting these conditions. Showing future appointments within next 90 days and meeting all other requirements  I discussed the assessment and treatment plan with the patient. The patient was provided an opportunity to ask questions and all were answered. The patient agreed with the plan and demonstrated an understanding of the instructions.  Patient advised to call back or seek an in-person evaluation if the symptoms or condition worsens.  Duration of encounter: 60mnutes.  Total time on encounter, as per AMA guidelines included both the face-to-face and non-face-to-face time personally spent by the physician and/or other qualified health care professional(s) on the day of the encounter (includes time in activities that require the physician or other qualified health care professional and does not include time in activities normally performed by clinical staff). Physician's time may include the following activities when performed: preparing to see the patient (eg, review of tests, pre-charting review of records) obtaining and/or  reviewing separately obtained history performing a medically appropriate examination and/or evaluation counseling and educating the patient/family/caregiver ordering medications, tests, or procedures referring and communicating with other health care professionals (when not separately reported) documenting clinical information in the electronic or other health record independently interpreting results (not separately reported) and communicating results to the patient/ family/caregiver care coordination (not separately reported)  Note by: BGillis Santa MD Date: 10/15/2022; Time: 10:36 AM

## 2022-10-15 NOTE — Progress Notes (Signed)
Nursing Pain Medication Assessment:  Safety precautions to be maintained throughout the outpatient stay will include: orient to surroundings, keep bed in low position, maintain call bell within reach at all times, provide assistance with transfer out of bed and ambulation.  Medication Inspection Compliance: Mr. Vanengen did not comply with our request to bring his pills to be counted. He was reminded that bringing the medication bottles, even when empty, is a requirement.  Medication: None brought in. Pill/Patch Count: None available to be counted. Bottle Appearance: No container available. Did not bring bottle(s) to appointment. Filled Date: N/A Last Medication intake:   has his last patch on.

## 2022-10-16 ENCOUNTER — Other Ambulatory Visit (HOSPITAL_BASED_OUTPATIENT_CLINIC_OR_DEPARTMENT_OTHER): Payer: Self-pay

## 2022-10-17 ENCOUNTER — Telehealth: Payer: Self-pay | Admitting: Cardiology

## 2022-10-17 DIAGNOSIS — G4733 Obstructive sleep apnea (adult) (pediatric): Secondary | ICD-10-CM

## 2022-10-17 DIAGNOSIS — I2510 Atherosclerotic heart disease of native coronary artery without angina pectoris: Secondary | ICD-10-CM

## 2022-10-17 DIAGNOSIS — I10 Essential (primary) hypertension: Secondary | ICD-10-CM

## 2022-10-17 NOTE — Telephone Encounter (Signed)
He will have an in office visit with the inspire rep in 4 weeks followed by a in lab inspire titration in 6 weeks. This is why his next test was not scheduled.

## 2022-10-17 NOTE — Telephone Encounter (Signed)
Patient states that he was supposed to have another sleep study but he has not spoken with anyone to schedule. Please advise.

## 2022-10-21 ENCOUNTER — Other Ambulatory Visit: Payer: Self-pay | Admitting: Family Medicine

## 2022-10-21 ENCOUNTER — Other Ambulatory Visit (HOSPITAL_BASED_OUTPATIENT_CLINIC_OR_DEPARTMENT_OTHER): Payer: Self-pay

## 2022-10-21 ENCOUNTER — Encounter: Payer: Self-pay | Admitting: Cardiology

## 2022-10-21 ENCOUNTER — Ambulatory Visit: Payer: 59 | Attending: Cardiology | Admitting: Cardiology

## 2022-10-21 VITALS — BP 136/72 | HR 96 | Ht 68.0 in | Wt 198.0 lb

## 2022-10-21 DIAGNOSIS — I2510 Atherosclerotic heart disease of native coronary artery without angina pectoris: Secondary | ICD-10-CM

## 2022-10-21 DIAGNOSIS — I10 Essential (primary) hypertension: Secondary | ICD-10-CM

## 2022-10-21 DIAGNOSIS — G4733 Obstructive sleep apnea (adult) (pediatric): Secondary | ICD-10-CM | POA: Diagnosis not present

## 2022-10-21 NOTE — Progress Notes (Signed)
Date:  10/21/2022   ID:  Brandon [name], DOB 1956-03-09, MRN [id number] The patient was identified using 2 identifiers. PCP:  Luetta Nutting, DO   [facility] Providers Cardiologist:  Freada Bergeron, MD Sleep Medicine:  Fransico Him, MD     Evaluation Performed:  Follow-Up Visit Chief Complaint:  OSA  History of Present Illness:    Brandon [name] is a 67 y.o. male with  a hx of CAD, hypertension, diabetes who was referred for sleep study testing by Dr. Johney Frame.  He has a history of obstructive sleep apnea the past but stopped using his CPAP and was referred back for repeat sleep study to get back on CPAP therapy.  He underwent split-night sleep study in June 2022 which revealed severe obstructive sleep apnea with an AHI of 45.7/h and O2 saturations as low as 54% consistent with nocturnal hypoxemia.  He failed CPAP therapy due to ongoing events and underwent BiPAP titration and was placed on auto BiPAP with IPAP max 20 cm H2O, EPAP min 5 cm H2O and pressure support 4 cm H2O   He really struggled with auto BiPAP device.  He was having problems waking up about 4 hours after starting to sleep because his mask was leaking and he would take it off.  He was having significant problems with the mask really leaking and so we ordered him new PAP supplies including a new nasal cushion with chin strap as he had not changed his cushion out since he got his device.     He was referred to ENT and ultimately underwent Inspire device implant 07/30/22 by Dr. Wilburn Cornelia.  He was seen 09/04/2022 and his inspire device was activated.  His amplitude was set at 1.2 V with a control of 1.2 to 2.2 V.  He is now back for 4-week virtual check-in.  He is doing well with his Inspire device.  He is sleeping 7-8 hours nightly.  He has not been feeling rested in the am and has been sleeping falling asleep during the day.   He has no sore throat or tongue sensitivity.  He is on level 9 which is 1.8V.    Past  Medical History:  Diagnosis Date   Anxiety    Arthritis    Basal cell carcinoma (BCC) in situ of skin    left neck   Chronic back pain    has spinal cord stimulator and wears buprenorphine patch   Colon polyps    Coronary artery disease    S/p DES to mRCA in 12/21 // S/p DES to LCx x 2 in 12/2020 // Diff dz in small to mod Dx (not a good target for PCI); OM1 100 CTO; severe dz in small OM2 and PDA >> Med Rx   Depression    Diabetes mellitus without complication (HCC)    type 2   Fibromyalgia    Gallstones    GERD (gastroesophageal reflux disease)    Hepatitis B    HLD (hyperlipidemia)    Hypertension    Osteoarthritis    Pneumonia    PONV (postoperative nausea and vomiting)    SBO (small bowel obstruction) (HCC)    Sleep apnea    does not use CPAP   Past Surgical History:  Procedure Laterality Date   CARDIAC CATHETERIZATION  01/17/2021   CATARACT EXTRACTION W/ INTRAOCULAR LENS  IMPLANT, BILATERAL     CHOLECYSTECTOMY     CHOLECYSTECTOMY, LAPAROSCOPIC     CORONARY STENT  INTERVENTION N/A 08/15/2020   Procedure: CORONARY STENT INTERVENTION;  Surgeon: Jettie Booze, MD;  Location: [place] [facility];  Service: Cardiovascular;  Laterality: N/A;   CORONARY STENT INTERVENTION N/A 01/17/2021   Procedure: CORONARY STENT INTERVENTION;  Surgeon: Burnell Blanks, MD;  Location: [place] [facility];  Service: Cardiovascular;  Laterality: N/A;   DRUG INDUCED ENDOSCOPY N/A 05/23/2022   Procedure: DRUG INDUCED SLEEP ENDOSCOPY;  Surgeon: Jerrell Belfast, MD;  Location: [place];  Service: ENT;  Laterality: N/A;   HERNIA REPAIR     IMPLANTATION OF HYPOGLOSSAL NERVE STIMULATOR Right 07/30/2022   Procedure: IMPLANTATION OF HYPOGLOSSAL NERVE STIMULATOR;  Surgeon: Jerrell Belfast, MD;  Location: [place];  Service: ENT;  Laterality: Right;   INTRAVASCULAR ULTRASOUND/IVUS N/A 08/15/2020   Procedure: Intravascular Ultrasound/IVUS;  Surgeon: Jettie Booze, MD;   Location: [place] [facility];  Service: Cardiovascular;  Laterality: N/A;   LEFT HEART CATH AND CORONARY ANGIOGRAPHY N/A 08/15/2020   Procedure: LEFT HEART CATH AND CORONARY ANGIOGRAPHY;  Surgeon: Jettie Booze, MD;  Location: [place] [facility];  Service: Cardiovascular;  Laterality: N/A;   LEFT HEART CATH AND CORONARY ANGIOGRAPHY N/A 01/17/2021   Procedure: LEFT HEART CATH AND CORONARY ANGIOGRAPHY;  Surgeon: Burnell Blanks, MD;  Location: [place] [facility];  Service: Cardiovascular;  Laterality: N/A;   LUMBAR FUSION     L3-5 with rods   MICRODISCECTOMY LUMBAR  2006   SPINAL CORD STIMULATOR INSERTION     blood clot removed from spinal cord   SPINE SURGERY     blood clot removed from spinal cord   XI ROBOTIC ASSISTED INGUINAL HERNIA REPAIR WITH MESH     x 3 surgeries     Current Meds  Medication Sig   acetaminophen (TYLENOL) 500 MG tablet Take 1,000 mg by mouth See admin instructions. Take 2 tablets (1000 mg) by mouth scheduled in the morning & may take 2 tablets (1000 mg) by mouth every 6 hours if needed for pain.   albuterol (VENTOLIN HFA) 108 (90 Base) MCG/ACT inhaler Inhale 2 puffs into the lungs as needed for wheezing or shortness of breath (When sick).   ARIPiprazole (ABILIFY) 10 MG tablet Take 1 tablet (10 mg total) by mouth every morning.   atorvastatin (LIPITOR) 80 MG tablet Take 1 tablet (80 mg total) by mouth daily.   b complex vitamins capsule Take 1 capsule by mouth in the morning.   Biotin (BIOTIN MAXIMUM STRENGTH) 10 MG TABS Take 1 tablet by mouth daily.   buprenorphine (BUTRANS) 15 MCG/HR Place 1 patch onto the skin every Friday.   buPROPion (WELLBUTRIN XL) 150 MG 24 hr tablet Take 1 tablet (150 mg total) by mouth daily in addition to '300mg'$  tablet.   buPROPion (WELLBUTRIN XL) 300 MG 24 hr tablet Take 1 tablet (300 mg total) by mouth daily.   clopidogrel (PLAVIX) 75 MG tablet Take 1 tablet (75 mg total) by mouth daily with breakfast.   Continuous Blood Gluc  Sensor (DEXCOM G6 SENSOR) MISC Use as directed. Change sensor every 10 days   docusate sodium (COLACE) 100 MG capsule Take 200 mg by mouth in the morning.   empagliflozin (JARDIANCE) 25 MG TABS tablet Take 1 tablet (25 mg total) by mouth daily.   GVOKE HYPOPEN 1-PACK 1 MG/0.2ML SOAJ Use as needed for hypoglycemia   insulin lispro (HUMALOG) 100 UNIT/ML injection For use in pump, total of 60 units per day   Iron, Ferrous Sulfate, 325 (65 Fe)  MG TABS Take 1 tablet (325 mg) by mouth daily.   isosorbide mononitrate (IMDUR) 30 MG 24 hr tablet Take 1 tablet (30 mg total) by mouth daily.   lidocaine (LIDODERM) 5 % Apply 1 - 3 patches on to the skin as directed. 12 hours on and 12 hours off.   magnesium oxide (MAG-OX) 400 (240 Mg) MG tablet Take 800 mg by mouth in the morning.   metoCLOPramide (REGLAN) 10 MG tablet Take 10 mg by mouth daily as needed for nausea or vomiting (upset stomach).   metoprolol succinate (TOPROL XL) 25 MG 24 hr tablet Take 1 tablet (25 mg total) by mouth daily.   Multiple Vitamin (MULTI-VITAMIN) tablet Take 1 tablet by mouth daily.   Na Sulfate-K Sulfate-Mg Sulf 17.5-3.13-1.6 GM/177ML SOLN Take by mouth as directed   nitroGLYCERIN (NITROSTAT) 0.4 MG SL tablet Place 0.4 mg under the tongue every 5 (five) minutes x 3 doses as needed for chest pain.   ondansetron (ZOFRAN-ODT) 4 MG disintegrating tablet Take 1 tablet (4 mg total) by mouth every 8 (eight) hours as needed for nausea or vomiting.   pantoprazole (PROTONIX) 40 MG tablet Take 1 tablet (40 mg total) by mouth daily.   polyethylene glycol powder (GLYCOLAX/MIRALAX) 17 GM/SCOOP powder Take 1 Container by mouth as needed for mild constipation.   tamsulosin (FLOMAX) 0.4 MG CAPS capsule Take 1 capsule (0.4 mg total) by mouth daily.   testosterone (ANDROGEL) 50 MG/5GM (1%) GEL Place 5 g (1 packet) onto the shoulders and upper arms daily as directed.   tirzepatide (MOUNJARO) 10 MG/0.5ML Pen Inject 10 mg into the skin once a week.    tiZANidine (ZANAFLEX) 4 MG tablet Take 1-1&1/2 tablets (4-6 mg total) by mouth every 8 (eight) hours as needed for muscle spasms.     Allergies:   Kiwi extract, Other, Penicillins, Ancef [cefazolin], Latex, Levaquin [levofloxacin], and Peach [prunus persica]   Social History   Tobacco Use   Smoking status: Former    Packs/day: 1.00    Years: 30.00    Total pack years: 30.00    Types: Cigarettes    Quit date: 2007    Years since quitting: 17.1   Smokeless tobacco: Never  Vaping Use   Vaping Use: Never used  Substance Use Topics   Alcohol use: Not Currently   Drug use: Not Currently     Family Hx: The patient's family history includes Anxiety disorder in his father and mother; CAD in his brother; COPD in his mother; Cancer in his father; Colon cancer in his mother; Coronary artery disease in his father; Depression in his father and mother; Leukemia in his maternal grandmother; Multiple sclerosis in his daughter. There is no history of Diabetes.  ROS:   Please see the history of present illness.     All other systems reviewed and are negative.   Prior Sleep studies:   The following studies were reviewed today:  None  Labs/Other Tests and Data Reviewed:     Recent Labs: 07/13/2022: Magnesium 1.9 09/26/2022: ALT 11; BUN 11; Creat 0.75; Hemoglobin 14.3; Platelets 209; Potassium 4.0; Sodium 141   Wt Readings from Last 3 Encounters:  10/21/22 198 lb (89.8 kg)  10/15/22 188 lb (85.3 kg)  10/07/22 188 lb (85.3 kg)     Risk Assessment/Calculations:      Objective:    Vital Signs:  BP 136/72   Pulse 96   Ht '5\' 8"'$  (1.727 m)   Wt 198 lb (89.8 kg)   SpO2  99%   BMI 30.11 kg/m   GEN: Well nourished, well developed in no acute distress HEENT: Normal NECK: No JVD; No carotid bruits LYMPHATICS: No lymphadenopathy CARDIAC:RRR, no murmurs, rubs, gallops RESPIRATORY:  Clear to auscultation without rales, wheezing or rhonchi  ABDOMEN: Soft, non-tender,  non-distended MUSCULOSKELETAL:  No edema; No deformity  SKIN: Warm and dry NEUROLOGIC:  Alert and oriented x 3 PSYCHIATRIC:  Normal affect  ASSESSMENT & PLAN:    OSA   -He  really struggled with his BiPAP device and could not use it at night. -referred to ENT -s/p Inspire device 07/30/2022 by Dr. Wilburn Cornelia -Inspire device activation performed 09/04/22 with Inspire rep with following parameters:             -Initial activation waveform looked good.             -Stimulation levels              -Final settings include : -amplitude 1.2 V with the patient control of 1.2 to 2.2 V -Pulse width 90 s -Rate 33 Hz -Start delay 30 minutes -Pause time 15 minutes -Therapy duration 10 hours -Electrode configuration  A +/-/+ -He is using his device at least 8-9hours nightly.   -Currently on level 9 which is 1.8V.   -He is feeling sleepy during the day and he took an overnight pulse oximetry test showed an O2 sat dropping in the 70's -I will discuss issues with Inspire rep and see if he needs to be seen by them first to interrogate his device and check his tongue motion or if we can proceed with going straight to the sleep lab for titration  Hypertension -BP is controlled on exam -Continue prescription drug management with Imdur 30 mg daily and Toprol-XL 25 mg daily with as needed refills  Total time of encounter: 20 minutes total time of encounter, including 15 minutes spent in face-to-face patient care on the date of this encounter. This time includes coordination of care and counseling regarding above mentioned problem list. Remainder of non-face-to-face time involved reviewing chart documents/testing relevant to the patient encounter and documentation in the medical record. I have independently reviewed documentation from referring provider.    Medication Adjustments/Labs and Tests Ordered: Current medicines are reviewed at length with the patient today.  Concerns regarding medicines are  outlined above.   Tests Ordered: No orders of the defined types were placed in this encounter.   Medication Changes: No orders of the defined types were placed in this encounter.   Follow Up:  In Person in 4 week(s) with Inspire Rep  Signed, Fransico Him, MD  10/21/2022 9:32 AM    [place]

## 2022-10-21 NOTE — Patient Instructions (Signed)
Medication Instructions:  Your physician recommends that you continue on your current medications as directed. Please refer to the Current Medication list given to you today.  *If you need a refill on your cardiac medications before your next appointment, please call your pharmacy*   Follow-Up: At [facility], you and your health needs are our priority.  As part of our continuing mission to provide you with exceptional heart care, we have created designated Provider Care Teams.  These Care Teams include your primary Cardiologist (physician) and Advanced Practice Providers (APPs -  Physician Assistants and Nurse Practitioners) who all work together to provide you with the care you need, when you need it.    Your next appointment:   We will contact you to set up next  visit  Provider:   Dr Radford Pax

## 2022-10-22 ENCOUNTER — Other Ambulatory Visit (HOSPITAL_BASED_OUTPATIENT_CLINIC_OR_DEPARTMENT_OTHER): Payer: Self-pay

## 2022-10-22 MED ORDER — ARIPIPRAZOLE 10 MG PO TABS
10.0000 mg | ORAL_TABLET | Freq: Every morning | ORAL | 1 refills | Status: DC
  Filled 2022-10-22 – 2022-11-27 (×2): qty 90, 90d supply, fill #0
  Filled 2023-02-18: qty 90, 90d supply, fill #1

## 2022-10-22 MED ORDER — PANTOPRAZOLE SODIUM 40 MG PO TBEC
40.0000 mg | DELAYED_RELEASE_TABLET | Freq: Every day | ORAL | 0 refills | Status: DC
  Filled 2022-10-22 – 2022-11-27 (×2): qty 90, 90d supply, fill #0

## 2022-10-22 MED ORDER — BUPROPION HCL ER (XL) 300 MG PO TB24
300.0000 mg | ORAL_TABLET | Freq: Every day | ORAL | 0 refills | Status: DC
  Filled 2022-10-22 – 2022-11-27 (×2): qty 90, 90d supply, fill #0

## 2022-10-22 MED ORDER — EMPAGLIFLOZIN 25 MG PO TABS
25.0000 mg | ORAL_TABLET | Freq: Every day | ORAL | 3 refills | Status: DC
  Filled 2022-10-22 – 2022-11-09 (×2): qty 90, 90d supply, fill #0
  Filled 2023-02-18: qty 90, 90d supply, fill #1
  Filled ????-??-??: fill #2

## 2022-10-23 ENCOUNTER — Telehealth: Payer: Self-pay | Admitting: Cardiology

## 2022-10-23 NOTE — Telephone Encounter (Signed)
Patient states she needs to have her Inspire equipment settings adjusted as her oxygen sats are dropping.

## 2022-10-24 ENCOUNTER — Other Ambulatory Visit: Payer: Self-pay | Admitting: Family Medicine

## 2022-10-24 MED ORDER — DOCUSATE SODIUM 100 MG PO CAPS
200.0000 mg | ORAL_CAPSULE | Freq: Every morning | ORAL | 6 refills | Status: DC
  Filled 2022-10-24: qty 200, 100d supply, fill #0
  Filled 2023-02-01: qty 200, 90d supply, fill #1
  Filled 2023-02-04 – 2023-02-18 (×2): qty 200, 100d supply, fill #1
  Filled 2023-08-13: qty 100, 50d supply, fill #2

## 2022-10-24 MED ORDER — METOCLOPRAMIDE HCL 10 MG PO TABS
10.0000 mg | ORAL_TABLET | Freq: Every day | ORAL | 1 refills | Status: DC | PRN
  Filled 2022-10-24: qty 90, 90d supply, fill #0
  Filled 2023-02-18: qty 90, 90d supply, fill #1

## 2022-10-25 ENCOUNTER — Other Ambulatory Visit (HOSPITAL_BASED_OUTPATIENT_CLINIC_OR_DEPARTMENT_OTHER): Payer: Self-pay

## 2022-10-25 MED ORDER — TESTOSTERONE 50 MG/5GM (1%) TD GEL
5.0000 g | Freq: Every day | TRANSDERMAL | 3 refills | Status: DC
  Filled 2022-10-25: qty 150, 30d supply, fill #0
  Filled 2022-11-18: qty 150, 30d supply, fill #1
  Filled 2023-01-13: qty 150, 30d supply, fill #2
  Filled 2023-02-01 – 2023-02-06 (×2): qty 150, 30d supply, fill #3

## 2022-10-28 ENCOUNTER — Other Ambulatory Visit (HOSPITAL_BASED_OUTPATIENT_CLINIC_OR_DEPARTMENT_OTHER): Payer: Self-pay

## 2022-10-28 ENCOUNTER — Other Ambulatory Visit: Payer: Self-pay

## 2022-10-28 ENCOUNTER — Encounter: Payer: Self-pay | Admitting: Family Medicine

## 2022-10-28 ENCOUNTER — Ambulatory Visit (INDEPENDENT_AMBULATORY_CARE_PROVIDER_SITE_OTHER): Payer: 59 | Admitting: Family Medicine

## 2022-10-28 VITALS — BP 132/82 | HR 80 | Ht 68.0 in | Wt 198.0 lb

## 2022-10-28 DIAGNOSIS — G4733 Obstructive sleep apnea (adult) (pediatric): Secondary | ICD-10-CM | POA: Diagnosis not present

## 2022-10-28 DIAGNOSIS — E1169 Type 2 diabetes mellitus with other specified complication: Secondary | ICD-10-CM

## 2022-10-28 DIAGNOSIS — Z794 Long term (current) use of insulin: Secondary | ICD-10-CM | POA: Diagnosis not present

## 2022-10-28 DIAGNOSIS — F3341 Major depressive disorder, recurrent, in partial remission: Secondary | ICD-10-CM

## 2022-10-28 DIAGNOSIS — I10 Essential (primary) hypertension: Secondary | ICD-10-CM

## 2022-10-28 MED ORDER — TIRZEPATIDE 12.5 MG/0.5ML ~~LOC~~ SOAJ
12.5000 mg | SUBCUTANEOUS | 0 refills | Status: DC
  Filled 2022-10-28: qty 2, 28d supply, fill #0

## 2022-10-28 NOTE — Progress Notes (Signed)
[name] - 67 y.o. male MRN [id number]  Date of birth: 1955/12/22  Subjective Chief Complaint  Patient presents with   Follow-up    HPI Brandon [name] is a 67 year old male here today for follow-up visit.  Remains on bupropion for 50 mg as well as Abilify 10 mg daily.  Continues to have some mild depressive symptoms mainly related to difficulty finding a job.  He has put in several applications without many interview opportunities.  He is concerned about nocturnal desaturations.  History of OSA and had inspire device placed a couple months ago.  He has met with his cardiologist who has been managing his sleep apnea however at last visit device rep was not available to make adjustments for titration.  He reports that his cardiologist did recently got him back surgery so he is unsure when he will have titration study.  He has been checking his O2 sats at home with home oximeter with readings into the 70s while sleeping.  He is doing better with Mounjaro compared to Ozempic.  He does feel like his appetite has increased that current dose of Mounjaro and would like to try going up to the neck strength.  Blood sugars have remained pretty well-controlled.  ROS:  A comprehensive ROS was completed and negative except as noted per HPI  Allergies  Allergen Reactions   Kiwi Extract Swelling    Mouth swelling.    Other Swelling and Other (See Comments)    EGGPLANT. Mouth swelling.   Penicillins Rash    Reaction: unknown   Ancef [Cefazolin] Rash   Latex Rash   Levaquin [Levofloxacin] Rash   Peach [Prunus Persica] Other (See Comments)    Burns mouth    Past Medical History:  Diagnosis Date   Anxiety    Arthritis    Basal cell carcinoma (BCC) in situ of skin    left neck   Chronic back pain    has spinal cord stimulator and wears buprenorphine patch   Colon polyps    Coronary artery disease    S/p DES to mRCA in 12/21 // S/p DES to LCx x 2 in 12/2020 // Diff dz in small to mod Dx (not a  good target for PCI); OM1 100 CTO; severe dz in small OM2 and PDA >> Med Rx   Depression    Diabetes mellitus without complication (HCC)    type 2   Fibromyalgia    Gallstones    GERD (gastroesophageal reflux disease)    Hepatitis B    HLD (hyperlipidemia)    Hypertension    Osteoarthritis    Pneumonia    PONV (postoperative nausea and vomiting)    SBO (small bowel obstruction) ([name])    Sleep apnea    does not use CPAP    Past Surgical History:  Procedure Laterality Date   CARDIAC CATHETERIZATION  01/17/2021   CATARACT EXTRACTION W/ INTRAOCULAR LENS  IMPLANT, BILATERAL     CHOLECYSTECTOMY     CHOLECYSTECTOMY, LAPAROSCOPIC     CORONARY STENT INTERVENTION N/A 08/15/2020   Procedure: CORONARY STENT INTERVENTION;  Surgeon: Brandon Booze, MD;  Location: [place] [facility];  Service: Cardiovascular;  Laterality: N/A;   CORONARY STENT INTERVENTION N/A 01/17/2021   Procedure: CORONARY STENT INTERVENTION;  Surgeon: Brandon Blanks, MD;  Location: [place] [facility];  Service: Cardiovascular;  Laterality: N/A;   DRUG INDUCED ENDOSCOPY N/A 05/23/2022   Procedure: DRUG INDUCED SLEEP ENDOSCOPY;  Surgeon: Jerrell Belfast, MD;  Location: [place];  Service: ENT;  Laterality: N/A;   HERNIA REPAIR     IMPLANTATION OF HYPOGLOSSAL NERVE STIMULATOR Right 07/30/2022   Procedure: IMPLANTATION OF HYPOGLOSSAL NERVE STIMULATOR;  Surgeon: Jerrell Belfast, MD;  Location: [place];  Service: ENT;  Laterality: Right;   INTRAVASCULAR ULTRASOUND/IVUS N/A 08/15/2020   Procedure: Intravascular Ultrasound/IVUS;  Surgeon: Brandon Booze, MD;  Location: [place] [facility];  Service: Cardiovascular;  Laterality: N/A;   LEFT HEART CATH AND CORONARY ANGIOGRAPHY N/A 08/15/2020   Procedure: LEFT HEART CATH AND CORONARY ANGIOGRAPHY;  Surgeon: Brandon Booze, MD;  Location: [place] [facility];  Service: Cardiovascular;  Laterality: N/A;   LEFT HEART CATH AND CORONARY ANGIOGRAPHY  N/A 01/17/2021   Procedure: LEFT HEART CATH AND CORONARY ANGIOGRAPHY;  Surgeon: Brandon Blanks, MD;  Location: [place] [facility];  Service: Cardiovascular;  Laterality: N/A;   LUMBAR FUSION     L3-5 with rods   MICRODISCECTOMY LUMBAR  2006   SPINAL CORD STIMULATOR INSERTION     blood clot removed from spinal cord   SPINE SURGERY     blood clot removed from spinal cord   XI ROBOTIC ASSISTED INGUINAL HERNIA REPAIR WITH MESH     x 3 surgeries    Social History   Socioeconomic History   Marital status: Married    Spouse name: Not on file   Number of children: 4   Years of education: 16   Highest education level: Not on file  Occupational History   Occupation: disabled/ respiratory therapist  Tobacco Use   Smoking status: Former    Packs/day: 1.00    Years: 30.00    Total pack years: 30.00    Types: Cigarettes    Quit date: 2007    Years since quitting: 17.1   Smokeless tobacco: Never  Vaping Use   Vaping Use: Never used  Substance and Sexual Activity   Alcohol use: Not Currently   Drug use: Not Currently   Sexual activity: Yes  Other Topics Concern   Not on file  Social History Narrative   Not on file   Social Determinants of Health   Financial Resource Strain: Not on file  Food Insecurity: No Food Insecurity (07/13/2022)   Hunger Vital Sign    Worried About Running Out of Food in the Last Year: Never true    [place] in the Last Year: Never true  Transportation Needs: No Transportation Needs (07/13/2022)   PRAPARE - Hydrologist (Medical): No    Lack of Transportation (Non-Medical): No  Physical Activity: Not on file  Stress: Not on file  Social Connections: Not on file    Family History  Problem Relation Age of Onset   COPD Mother    Anxiety disorder Mother    Depression Mother    Colon cancer Mother    Cancer Father        mets, origin unknown   Anxiety disorder Father    Depression Father    Coronary  artery disease Father    CAD Brother    Leukemia Maternal Grandmother    Multiple sclerosis Daughter    Diabetes Neg Hx     Health Maintenance  Topic Date Due   Medicare Annual Wellness (AWV)  Never done   DTaP/Tdap/Td (1 - Tdap) Never done   OPHTHALMOLOGY EXAM  09/26/2022   COVID-19 Vaccine (3 - 2023-24 season) 11/13/2022 (Originally 04/26/2022)   COLONOSCOPY (Pts 45-67yr Insurance coverage will  need to be confirmed)  01/10/2023 (Originally 09/13/2000)   Hepatitis C Screening  01/10/2023 (Originally 09/13/1973)   Zoster Vaccines- Shingrix (1 of 2) 01/28/2023 (Originally 09/13/2005)   Diabetic kidney evaluation - Urine ACR  03/27/2023   HEMOGLOBIN A1C  03/27/2023   FOOT EXAM  05/14/2023   Diabetic kidney evaluation - eGFR measurement  09/27/2023   Pneumonia Vaccine 2+ Years old  Completed   INFLUENZA VACCINE  Completed   HPV VACCINES  Aged Out     ----------------------------------------------------------------------------------------------------------------------------------------------------------------------------------------------------------------- Physical Exam BP 132/82   Pulse 80   Ht '5\' 8"'$  (1.727 m)   Wt 198 lb (89.8 kg)   SpO2 99%   BMI 30.11 kg/m   Physical Exam Constitutional:      Appearance: Normal appearance.  HENT:     Head: Normocephalic and atraumatic.  Eyes:     General: No scleral icterus. Cardiovascular:     Rate and Rhythm: Normal rate and regular rhythm.  Pulmonary:     Effort: Pulmonary effort is normal.     Breath sounds: Normal breath sounds.  Musculoskeletal:     Cervical back: Neck supple.  Neurological:     Mental Status: He is alert.  Psychiatric:        Mood and Affect: Mood normal.        Behavior: Behavior normal.      ------------------------------------------------------------------------------------------------------------------------------------------------------------------------------------------------------------------- Assessment and Plan  Obstructive sleep apnea Does not seem like he has inspire device is quite where he needs to be.  He is having some nocturnal desaturations based on his home oximetry.  Will order overnight oximetry to have this checked at home and if this is truly low we will plan to add home oxygen at night.  Type 2 diabetes mellitus with other specified complication ([place]) He is tolerating Mounjaro better compared to Ozempic however having more appetite.  Continue titration of Mounjaro to 12.5 mg weekly.  Primary hypertension Blood pressure mains pretty well-controlled at this time.  Recommend continuation of current medications for management of hypertension.  He will continue to follow with cardiology for coronary artery disease.  Major depression in partial remission (HCC) Symptoms have improved however still with mild depressive symptoms.  He is hopeful to find a job soon.   Meds ordered this encounter  Medications   DISCONTD: tirzepatide (MOUNJARO) 12.5 MG/0.5ML Pen    Sig: Inject 12.5 mg into the skin once a week.    Dispense:  2 mL    Refill:  0   tirzepatide (MOUNJARO) 12.5 MG/0.5ML Pen    Sig: Inject 12.5 mg into the skin once a week.    Dispense:  2 mL    Refill:  0    No follow-ups on file.    This visit occurred during the SARS-CoV-2 public health emergency.  Safety protocols were in place, including screening questions prior to the visit, additional usage of staff PPE, and extensive cleaning of exam room while observing appropriate contact time as indicated for disinfecting solutions.

## 2022-10-28 NOTE — Assessment & Plan Note (Signed)
He is tolerating Mounjaro better compared to Ozempic however having more appetite.  Continue titration of Mounjaro to 12.5 mg weekly.

## 2022-10-28 NOTE — Assessment & Plan Note (Signed)
Does not seem like he has inspire device is quite where he needs to be.  He is having some nocturnal desaturations based on his home oximetry.  Will order overnight oximetry to have this checked at home and if this is truly low we will plan to add home oxygen at night.

## 2022-10-28 NOTE — Assessment & Plan Note (Signed)
Blood pressure mains pretty well-controlled at this time.  Recommend continuation of current medications for management of hypertension.  He will continue to follow with cardiology for coronary artery disease.

## 2022-10-28 NOTE — Assessment & Plan Note (Signed)
Symptoms have improved however still with mild depressive symptoms.  He is hopeful to find a job soon.

## 2022-10-29 ENCOUNTER — Other Ambulatory Visit (HOSPITAL_BASED_OUTPATIENT_CLINIC_OR_DEPARTMENT_OTHER): Payer: Self-pay

## 2022-10-29 NOTE — Telephone Encounter (Signed)
Patient is scheduled to see rep in our office 10/31/22.

## 2022-10-30 ENCOUNTER — Encounter: Payer: Self-pay | Admitting: Gastroenterology

## 2022-10-31 ENCOUNTER — Encounter: Payer: Self-pay | Admitting: Physician Assistant

## 2022-10-31 ENCOUNTER — Ambulatory Visit: Payer: 59 | Attending: Physician Assistant | Admitting: Physician Assistant

## 2022-10-31 ENCOUNTER — Telehealth: Payer: Self-pay | Admitting: *Deleted

## 2022-10-31 VITALS — BP 142/78 | HR 80 | Ht 68.0 in | Wt 198.0 lb

## 2022-10-31 DIAGNOSIS — I2510 Atherosclerotic heart disease of native coronary artery without angina pectoris: Secondary | ICD-10-CM | POA: Diagnosis not present

## 2022-10-31 DIAGNOSIS — I10 Essential (primary) hypertension: Secondary | ICD-10-CM | POA: Diagnosis not present

## 2022-10-31 DIAGNOSIS — G4733 Obstructive sleep apnea (adult) (pediatric): Secondary | ICD-10-CM | POA: Insufficient documentation

## 2022-10-31 NOTE — Telephone Encounter (Signed)
Spoke with pt and offered him an appt at [facility] for 11/25/22. Pt states he needs to check his wife's work schedule as she works nights. States he will call back regarding scheduling colon.

## 2022-10-31 NOTE — Telephone Encounter (Signed)
Per Nicholes Rough: hey could you help [place] get some oxygen for this patient?   Nina: Oxygen is tricky, Darlina Guys will call you on tomorrow and explain about it. How can she reach you?

## 2022-10-31 NOTE — Progress Notes (Signed)
Office Visit    Patient Name: Brandon [name] Date of Encounter: 10/31/2022  PCP:  Luetta Nutting, [facility]  Cardiologist:  Freada Bergeron, MD  Advanced Practice Provider:  No care team member to display Electrophysiologist:  None   HPI    Brandon [name] is a 67 y.o. male with a past medical history of CAD, hypertension, diabetes who was referred to Dr. Radford Pax for a sleep study presents today for follow-up appointment.  He has a history of OSA in the past but stopped using his CPAP and referred back for a sleep study to get back on CPAP therapy.  Underwent split night sleep study June 2022 which revealed severe OSA with AHI of 45.7/h and oxygen saturations as low as 54% consistent with nocturnal hypoxemia.  Failed CPAP therapy due to ongoing events and underwent BiPAP titration and was placed on auto BiPAP.  He really struggled with auto BiPAP device.  Was having problems waking up about 4 hours after starting to sleep because his mask was leaking and he would take it off.  Ultimately, was referred to ENT and underwent inspire device implantation 07/30/2022 by Dr. Wilburn Cornelia.  Was seen 09/04/2022 and his inspire device was activated.  He was seen a few weeks ago by Dr. Radford Pax and was doing well.  Sleeping 7 to 8 hours a night.  Feeling rested in the AM.  No sore throat or tongue sensitivity.  He was on level 9 which is 1.8V.  Today, he states that he has been doing well from a cardiac standpoint.  He denies chest pain and shortness of breath.  He has had low oxygen saturations at night to in the 70s.  Sonia Baller the inspire rep was here today checking his device.  She is working on titration which should help him get more restful sleep.  We will plan for follow-up in 6 weeks.  We are working on getting him oxygen for the interim amount of time.  Reports no shortness of breath nor dyspnea on exertion. Reports no chest pain, pressure, or tightness. No edema,  orthopnea, PND. Reports no palpitations.    Past Medical History    Past Medical History:  Diagnosis Date   Anxiety    Arthritis    Basal cell carcinoma (BCC) in situ of skin    left neck   Chronic back pain    has spinal cord stimulator and wears buprenorphine patch   Colon polyps    Coronary artery disease    S/p DES to mRCA in 12/21 // S/p DES to LCx x 2 in 12/2020 // Diff dz in small to mod Dx (not a good target for PCI); OM1 100 CTO; severe dz in small OM2 and PDA >> Med Rx   Depression    Diabetes mellitus without complication (HCC)    type 2   Fibromyalgia    Gallstones    GERD (gastroesophageal reflux disease)    Hepatitis B    HLD (hyperlipidemia)    Hypertension    Osteoarthritis    Pneumonia    PONV (postoperative nausea and vomiting)    SBO (small bowel obstruction) (HCC)    Sleep apnea    does not use CPAP   Past Surgical History:  Procedure Laterality Date   CARDIAC CATHETERIZATION  01/17/2021   CATARACT EXTRACTION W/ INTRAOCULAR LENS  IMPLANT, BILATERAL     CHOLECYSTECTOMY     CHOLECYSTECTOMY, LAPAROSCOPIC  CORONARY STENT INTERVENTION N/A 08/15/2020   Procedure: CORONARY STENT INTERVENTION;  Surgeon: Jettie Booze, MD;  Location: [place] [facility];  Service: Cardiovascular;  Laterality: N/A;   CORONARY STENT INTERVENTION N/A 01/17/2021   Procedure: CORONARY STENT INTERVENTION;  Surgeon: Burnell Blanks, MD;  Location: [place] [facility];  Service: Cardiovascular;  Laterality: N/A;   DRUG INDUCED ENDOSCOPY N/A 05/23/2022   Procedure: DRUG INDUCED SLEEP ENDOSCOPY;  Surgeon: Jerrell Belfast, MD;  Location: [place];  Service: ENT;  Laterality: N/A;   HERNIA REPAIR     IMPLANTATION OF HYPOGLOSSAL NERVE STIMULATOR Right 07/30/2022   Procedure: IMPLANTATION OF HYPOGLOSSAL NERVE STIMULATOR;  Surgeon: Jerrell Belfast, MD;  Location: [place];  Service: ENT;  Laterality: Right;   INTRAVASCULAR ULTRASOUND/IVUS N/A 08/15/2020    Procedure: Intravascular Ultrasound/IVUS;  Surgeon: Jettie Booze, MD;  Location: [place] [facility];  Service: Cardiovascular;  Laterality: N/A;   LEFT HEART CATH AND CORONARY ANGIOGRAPHY N/A 08/15/2020   Procedure: LEFT HEART CATH AND CORONARY ANGIOGRAPHY;  Surgeon: Jettie Booze, MD;  Location: [place] [facility];  Service: Cardiovascular;  Laterality: N/A;   LEFT HEART CATH AND CORONARY ANGIOGRAPHY N/A 01/17/2021   Procedure: LEFT HEART CATH AND CORONARY ANGIOGRAPHY;  Surgeon: Burnell Blanks, MD;  Location: [place] [facility];  Service: Cardiovascular;  Laterality: N/A;   LUMBAR FUSION     L3-5 with rods   MICRODISCECTOMY LUMBAR  2006   SPINAL CORD STIMULATOR INSERTION     blood clot removed from spinal cord   SPINE SURGERY     blood clot removed from spinal cord   XI ROBOTIC ASSISTED INGUINAL HERNIA REPAIR WITH MESH     x 3 surgeries    Allergies  Allergies  Allergen Reactions   Kiwi Extract Swelling    Mouth swelling.    Other Swelling and Other (See Comments)    EGGPLANT. Mouth swelling.   Penicillins Rash    Reaction: unknown   Ibuprofen Other (See Comments)    NDC [id number]:[id number], NDC KX:[id number]   Ancef [Cefazolin] Rash   Latex Rash   Levaquin [Levofloxacin] Rash   Peach [Prunus Persica] Other (See Comments)    Burns mouth    EKGs/Labs/Other Studies Reviewed:   The following studies were reviewed today:  Echocardiogram 11/01/2021 IMPRESSIONS     1. Left ventricular ejection fraction, by estimation, is 60 to 65%. The  left ventricle has normal function. The left ventricle has no regional  wall motion abnormalities. There is moderate concentric left ventricular  hypertrophy. Left ventricular  diastolic parameters are consistent with Grade I diastolic dysfunction  (impaired relaxation).   2. Right ventricular systolic function is normal. The right ventricular  size is normal. Tricuspid regurgitation signal is inadequate for  assessing  PA pressure.   3. The mitral valve is normal in structure. No evidence of mitral valve  regurgitation. No evidence of mitral stenosis.   4. The aortic valve is tricuspid. Aortic valve regurgitation is not  visualized.   5. The inferior vena cava is normal in size with greater than 50%  respiratory variability, suggesting right atrial pressure of 3 mmHg.   Comparison(s): No significant change from prior study.   FINDINGS   Left Ventricle: Left ventricular ejection fraction, by estimation, is 60  to 65%. The left ventricle has normal function. The left ventricle has no  regional wall motion abnormalities. The left ventricular internal cavity  size was normal in size. There is  moderate concentric left ventricular hypertrophy. Left ventricular  diastolic parameters are consistent with Grade I diastolic dysfunction  (impaired relaxation).   Right Ventricle: The right ventricular size is normal. No increase in  right ventricular wall thickness. Right ventricular systolic function is  normal. Tricuspid regurgitation signal is inadequate for assessing PA  pressure.   Left Atrium: Left atrial size was normal in size.   Right Atrium: Right atrial size was normal in size.   Pericardium: There is no evidence of pericardial effusion.   Mitral Valve: The mitral valve is normal in structure. No evidence of  mitral valve regurgitation. No evidence of mitral valve stenosis. MV peak  gradient, 4.2 mmHg. The mean mitral valve gradient is 2.0 mmHg.   Tricuspid Valve: The tricuspid valve is normal in structure. Tricuspid  valve regurgitation is not demonstrated. No evidence of tricuspid  stenosis.   Aortic Valve: The aortic valve is tricuspid. Aortic valve regurgitation is  not visualized. Aortic valve mean gradient measures 3.0 mmHg. Aortic valve  peak gradient measures 5.7 mmHg. Aortic valve area, by VTI measures 3.64  cm.   Pulmonic Valve: The pulmonic valve was not well  visualized. Pulmonic valve  regurgitation is not visualized. No evidence of pulmonic stenosis.   Aorta: The aortic root and ascending aorta are structurally normal, with  no evidence of dilitation.   Venous: The inferior vena cava is normal in size with greater than 50%  respiratory variability, suggesting right atrial pressure of 3 mmHg.   IAS/Shunts: No atrial level shunt detected by color flow Doppler.    EKG:  EKG is not ordered today.    Recent Labs: 07/13/2022: Magnesium 1.9 09/26/2022: ALT 11; BUN 11; Creat 0.75; Hemoglobin 14.3; Platelets 209; Potassium 4.0; Sodium 141  Recent Lipid Panel    Component Value Date/Time   CHOL 105 03/26/2022 0000   CHOL 113 10/23/2020 0824   TRIG 256 (H) 03/26/2022 0000   HDL 32 (L) 03/26/2022 0000   HDL 45 10/23/2020 0824   CHOLHDL 3.3 03/26/2022 0000   VLDL 22 11/01/2021 0334   LDLCALC 43 03/26/2022 0000    Home Medications   Current Meds  Medication Sig   acetaminophen (TYLENOL) 500 MG tablet Take 1,000 mg by mouth See admin instructions. Take 2 tablets (1000 mg) by mouth scheduled in the morning & may take 2 tablets (1000 mg) by mouth every 6 hours if needed for pain.   albuterol (VENTOLIN HFA) 108 (90 Base) MCG/ACT inhaler Inhale 2 puffs into the lungs as needed for wheezing or shortness of breath (When sick).   ARIPiprazole (ABILIFY) 10 MG tablet Take 1 tablet (10 mg total) by mouth every morning.   atorvastatin (LIPITOR) 80 MG tablet Take 1 tablet (80 mg total) by mouth daily.   b complex vitamins capsule Take 1 capsule by mouth in the morning.   Biotin (BIOTIN MAXIMUM STRENGTH) 10 MG TABS Take 1 tablet by mouth daily.   buprenorphine (BUTRANS) 15 MCG/HR Place 1 patch onto the skin every Friday.   buPROPion (WELLBUTRIN XL) 150 MG 24 hr tablet Take 1 tablet (150 mg total) by mouth daily in addition to '300mg'$  tablet.   buPROPion (WELLBUTRIN XL) 300 MG 24 hr tablet Take 1 tablet (300 mg total) by mouth daily.   clopidogrel (PLAVIX) 75  MG tablet Take 1 tablet (75 mg total) by mouth daily with breakfast.   Continuous Blood Gluc Sensor (DEXCOM G6 SENSOR) MISC Use as directed. Change sensor every 10 days   docusate  sodium (COLACE) 100 MG capsule Take 2 capsules (200 mg total) by mouth in the morning.   empagliflozin (JARDIANCE) 25 MG TABS tablet Take 1 tablet (25 mg total) by mouth daily.   GVOKE HYPOPEN 1-PACK 1 MG/0.2ML SOAJ Use as needed for hypoglycemia   insulin lispro (HUMALOG) 100 UNIT/ML injection For use in pump, total of 60 units per day   Iron, Ferrous Sulfate, 325 (65 Fe) MG TABS Take 1 tablet (325 mg) by mouth daily.   isosorbide mononitrate (IMDUR) 30 MG 24 hr tablet Take 1 tablet (30 mg total) by mouth daily.   lidocaine (LIDODERM) 5 % Apply 1 - 3 patches on to the skin as directed. 12 hours on and 12 hours off.   magnesium oxide (MAG-OX) 400 (240 Mg) MG tablet Take 800 mg by mouth in the morning.   metoCLOPramide (REGLAN) 10 MG tablet Take 1 tablet (10 mg total) by mouth daily as needed for nausea or vomiting (upset stomach).   metoprolol succinate (TOPROL XL) 25 MG 24 hr tablet Take 1 tablet (25 mg total) by mouth daily.   Multiple Vitamin (MULTI-VITAMIN) tablet Take 1 tablet by mouth daily.   Na Sulfate-K Sulfate-Mg Sulf 17.5-3.13-1.6 GM/177ML SOLN Take by mouth as directed   nitroGLYCERIN (NITROSTAT) 0.4 MG SL tablet Place 0.4 mg under the tongue every 5 (five) minutes x 3 doses as needed for chest pain.   ondansetron (ZOFRAN-ODT) 4 MG disintegrating tablet Take 1 tablet (4 mg total) by mouth every 8 (eight) hours as needed for nausea or vomiting.   pantoprazole (PROTONIX) 40 MG tablet Take 1 tablet (40 mg total) by mouth daily.   polyethylene glycol powder (GLYCOLAX/MIRALAX) 17 GM/SCOOP powder Take 1 Container by mouth as needed for mild constipation.   tamsulosin (FLOMAX) 0.4 MG CAPS capsule Take 1 capsule (0.4 mg total) by mouth daily.   testosterone (ANDROGEL) 50 MG/5GM (1%) GEL Place 5 g (1 packet) onto the  shoulders and upper arms daily as directed.   tirzepatide (MOUNJARO) 12.5 MG/0.5ML Pen Inject 12.5 mg into the skin once a week.   tiZANidine (ZANAFLEX) 4 MG tablet Take 1-1&1/2 tablets (4-6 mg total) by mouth every 8 (eight) hours as needed for muscle spasms.     Review of Systems      All other systems reviewed and are otherwise negative except as noted above.  Physical Exam    VS:  BP (!) 142/78   Pulse 80   Ht '5\' 8"'$  (1.727 m)   Wt 198 lb (89.8 kg)   SpO2 98%   BMI 30.11 kg/m  , BMI Body mass index is 30.11 kg/m.  Wt Readings from Last 3 Encounters:  10/31/22 198 lb (89.8 kg)  10/28/22 198 lb (89.8 kg)  10/21/22 198 lb (89.8 kg)     GEN: Well nourished, well developed, in no acute distress. HEENT: normal. Neck: Supple, no JVD, carotid bruits, or masses. Cardiac: RRR, no murmurs, rubs, or gallops. No clubbing, cyanosis, edema.  Radials/PT 2+ and equal bilaterally.  Respiratory:  Respirations regular and unlabored, clear to auscultation bilaterally. GI: Soft, nontender, nondistended. MS: No deformity or atrophy. Skin: Warm and dry, no rash. Neuro:  Strength and sensation are intact. Psych: Normal affect.  Assessment & Plan    OSA -78% at night, working on getting oxygen for him -Inspire titration today and device checked -will plan to follow-up in 6 weeks with rep  Hypertension -well controlled today -continue current medications including Imdur 30 mg daily, metoprolol succinate 25  mg daily -BP usually 118/75 at home  CAD s/p DES to [facility] 12/21 -no SOB or chest pain -no cardiac issues     Disposition: Follow up 6 weeks with Freada Bergeron, MD or APP.  Signed, Elgie Collard, PA-C 10/31/2022, 3:28 PM [facility]

## 2022-10-31 NOTE — Patient Instructions (Addendum)
Medication Instructions:   Your physician recommends that you continue on your current medications as directed. Please refer to the Current Medication list given to you today.  *If you need a refill on your cardiac medications before your next appointment, please call your pharmacy*   If you have labs (blood work) drawn today and your tests are completely normal, you will receive your results only by: [facility] (if you have MyChart) OR A paper copy in the mail If you have any lab test that is abnormal or we need to change your treatment, we will call you to review the results.   Testing/Procedures: NONE ORDERED  TODAY    Follow-Up:   At [facility], you and your health needs are our priority.  As part of our continuing mission to provide you with exceptional heart care, we have created designated Provider Care Teams.  These Care Teams include your primary Cardiologist (physician) and Advanced Practice Providers (APPs -  Physician Assistants and Nurse Practitioners) who all work together to provide you with the care you need, when you need it.  We recommend signing up for the patient portal called "MyChart".  Sign up information is provided on this After Visit Summary.  MyChart is used to connect with patients for Virtual Visits (Telemedicine).  Patients are able to view lab/test results, encounter notes, upcoming appointments, etc.  Non-urgent messages can be sent to your provider as well.   To learn more about what you can do with MyChart, go to [email or web address].    Your next appointment:    6 week(s)  Provider:  INSPIRE REP     Other Instructions

## 2022-11-01 ENCOUNTER — Other Ambulatory Visit: Payer: Self-pay

## 2022-11-01 ENCOUNTER — Telehealth: Payer: Self-pay | Admitting: Family Medicine

## 2022-11-01 DIAGNOSIS — Z86010 Personal history of colonic polyps: Secondary | ICD-10-CM

## 2022-11-01 DIAGNOSIS — Z800 Family history of malignant neoplasm of digestive organs: Secondary | ICD-10-CM

## 2022-11-01 NOTE — Telephone Encounter (Signed)
Home sleep study/Overnight Pulse oximetry orders and clinical notes have been faxed to [employer] [facility] at [phone].

## 2022-11-05 ENCOUNTER — Other Ambulatory Visit (HOSPITAL_BASED_OUTPATIENT_CLINIC_OR_DEPARTMENT_OTHER): Payer: Self-pay

## 2022-11-06 ENCOUNTER — Other Ambulatory Visit (HOSPITAL_BASED_OUTPATIENT_CLINIC_OR_DEPARTMENT_OTHER): Payer: Self-pay

## 2022-11-06 ENCOUNTER — Other Ambulatory Visit: Payer: Self-pay | Admitting: Family Medicine

## 2022-11-06 DIAGNOSIS — G4734 Idiopathic sleep related nonobstructive alveolar hypoventilation: Secondary | ICD-10-CM | POA: Insufficient documentation

## 2022-11-06 MED ORDER — AMBULATORY NON FORMULARY MEDICATION: 0 refills | Status: AC

## 2022-11-07 ENCOUNTER — Telehealth: Payer: Self-pay | Admitting: Cardiology

## 2022-11-07 DIAGNOSIS — G4734 Idiopathic sleep related nonobstructive alveolar hypoventilation: Secondary | ICD-10-CM | POA: Diagnosis not present

## 2022-11-07 NOTE — Telephone Encounter (Signed)
Brandon [name] with [facility] would like to speak with someone in regards to 02 requirements for patient.

## 2022-11-07 NOTE — Telephone Encounter (Signed)
Spoke to [place] with [facility], she will like to talk with Nicholes Rough pertaining to the patient requiring supplemental oxygen. Will forward to APP.

## 2022-11-08 NOTE — Telephone Encounter (Signed)
Left message for Brandon [name] to call back.  

## 2022-11-09 ENCOUNTER — Other Ambulatory Visit (HOSPITAL_BASED_OUTPATIENT_CLINIC_OR_DEPARTMENT_OTHER): Payer: Self-pay

## 2022-11-11 ENCOUNTER — Other Ambulatory Visit: Payer: Self-pay

## 2022-11-12 ENCOUNTER — Ambulatory Visit (INDEPENDENT_AMBULATORY_CARE_PROVIDER_SITE_OTHER): Payer: No Typology Code available for payment source | Admitting: Internal Medicine

## 2022-11-12 ENCOUNTER — Encounter: Payer: Self-pay | Admitting: Internal Medicine

## 2022-11-12 VITALS — BP 140/90 | HR 68 | Ht 68.0 in | Wt 199.2 lb

## 2022-11-12 DIAGNOSIS — E1159 Type 2 diabetes mellitus with other circulatory complications: Secondary | ICD-10-CM | POA: Diagnosis not present

## 2022-11-12 DIAGNOSIS — E1169 Type 2 diabetes mellitus with other specified complication: Secondary | ICD-10-CM

## 2022-11-12 DIAGNOSIS — Z794 Long term (current) use of insulin: Secondary | ICD-10-CM | POA: Diagnosis not present

## 2022-11-12 DIAGNOSIS — E785 Hyperlipidemia, unspecified: Secondary | ICD-10-CM | POA: Diagnosis not present

## 2022-11-12 NOTE — Patient Instructions (Addendum)
Please use the following pump settings: - Basal rates: 12 am: 0.5 units/h - Insulin to carb ratio: 12 am: 1:20 - Target: 12 am: 1:120 - Correction factor (insulin sensitivity factor):  12 am: 100 - Active insulin time: 5h - Changes infusion site: q3 days  Please do the following approximately 15 minutes before every meal: - Enter carbs (C) - Enter sugars (S) - Start insulin bolus (I)  If you bolus insulin after a meal, try to not enter carbs but just correct the high blood sugar.  Please continue: - Jardiance 25 mg before breakfast - Mounjaro 12.5 mg weekly   Please return in 6 months.

## 2022-11-12 NOTE — Progress Notes (Signed)
Patient ID: ZIAH DEIS, male   DOB: 1956/02/16, 67 y.o.   MRN: [id number]  HPI: Brandon [name] is a 67 y.o.-year-old male, returning for follow-up for DM2, dx in 2004, insulin-dependent since 2012, controlled, with complications (CAD, gastroparesis).  He previously saw Dr. Loanne Drilling.  Last visit with me 6 months ago.  Interim history: He feels well, without complaints today. No increased urination, blurry vision, nausea, chest pain. Since last visit, he a partial SBO 06/2022 (the 3rd episode). He was taken off Ozempic >> was off GLP-1 receptor agonist  >> started [facility] within the last month.  He tolerates this well. He had an Inspire device inserted to help with OSA. He likes it.  However, he has oxygen desaturation at night and was started on oxygen.  Since this was drying his mouth and nose, he now also has a humidifier.  Insulin pump: - T: slim x2  CGM: - Dexcom G6  Insulin: - Humalog  Supplier: [employer]  Reviewed HbA1c: Lab Results  Component Value Date   HGBA1C 6.6 (H) 09/26/2022   HGBA1C 6.1 (A) 05/13/2022   HGBA1C 6.2 (A) 01/07/2022   HGBA1C 6.3 (H) 11/01/2021   HGBA1C 6.4 (A) 09/04/2021   HGBA1C 6.5 (H) 08/28/2021   HGBA1C 6.1 (A) 05/29/2021   HGBA1C 6.3 (A) 03/22/2021   HGBA1C 6.8 (A) 01/16/2021   HGBA1C 6.5 (A) 11/15/2020   Brandon [name] is on a regimen of: - Jardiance 25 mg before breakfast - Ozempic 2 mg weekly - lost 65 lbs in last 1.5 years >> Mounjaro 12.5 mg weekly  Insulin pump settings: - Basal rates: 12 am: 0.5 units/h - Insulin to carb ratio: 12 am: 1:5 >> 1:15 >> 1:20 - Target: 12 am: 1:120 - Correction factor (insulin sensitivity factor):  12 am: 100 - Active insulin time: 5h - Changes infusion site: q3 days  Total daily dose from basal insulin: 16 units (59%) Total daily dose from bolus insulin: 12 units (41%)  He checks his sugars with a Dexcom CGM, more than 4 times a day:  Previously:   Lowest sugar was 60 >> 54 >> 48; he has  hypoglycemia awareness at 70.  Highest sugar was 359 >> 261 >> 278.  Glucometer:Freestyle  - no CKD, last BUN/creatinine:  Lab Results  Component Value Date   BUN 11 09/26/2022   BUN 8 07/25/2022   CREATININE 0.75 09/26/2022   CREATININE 0.70 07/25/2022  Not on ACE inhibitor/ARB.  -+ Dyslipidemia; last set of lipids: Lab Results  Component Value Date   CHOL 105 03/26/2022   HDL 32 (L) 03/26/2022   LDLCALC 43 03/26/2022   TRIG 256 (H) 03/26/2022   CHOLHDL 3.3 03/26/2022  On Lipitor 80 mg daily.  - last eye exam was in 2023. No DR. Coming up in 11/2022.  - no numbness and tingling in his feet.  Last foot exam in 04/2022.  On ASA 81.  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  ROS: + see HPI  Past Medical History:  Diagnosis Date   Anxiety    Arthritis    Basal cell carcinoma (BCC) in situ of skin    left neck   Chronic back pain    has spinal cord stimulator and wears buprenorphine patch   Colon polyps    Coronary artery disease    S/p DES to mRCA in 12/21 // S/p DES to LCx x 2 in 12/2020 // Diff dz in small to mod Dx (not a  good target for PCI); OM1 100 CTO; severe dz in small OM2 and PDA >> Med Rx   Depression    Diabetes mellitus without complication (HCC)    type 2   Fibromyalgia    Gallstones    GERD (gastroesophageal reflux disease)    Hepatitis B    HLD (hyperlipidemia)    Hypertension    Osteoarthritis    Pneumonia    PONV (postoperative nausea and vomiting)    SBO (small bowel obstruction) (HCC)    Sleep apnea    does not use CPAP   Past Surgical History:  Procedure Laterality Date   CARDIAC CATHETERIZATION  01/17/2021   CATARACT EXTRACTION W/ INTRAOCULAR LENS  IMPLANT, BILATERAL     CHOLECYSTECTOMY     CHOLECYSTECTOMY, LAPAROSCOPIC     CORONARY STENT INTERVENTION N/A 08/15/2020   Procedure: CORONARY STENT INTERVENTION;  Surgeon: Jettie Booze, MD;  Location: [place] [facility];  Service:  Cardiovascular;  Laterality: N/A;   CORONARY STENT INTERVENTION N/A 01/17/2021   Procedure: CORONARY STENT INTERVENTION;  Surgeon: Burnell Blanks, MD;  Location: [place] [facility];  Service: Cardiovascular;  Laterality: N/A;   DRUG INDUCED ENDOSCOPY N/A 05/23/2022   Procedure: DRUG INDUCED SLEEP ENDOSCOPY;  Surgeon: Jerrell Belfast, MD;  Location: [place];  Service: ENT;  Laterality: N/A;   HERNIA REPAIR     IMPLANTATION OF HYPOGLOSSAL NERVE STIMULATOR Right 07/30/2022   Procedure: IMPLANTATION OF HYPOGLOSSAL NERVE STIMULATOR;  Surgeon: Jerrell Belfast, MD;  Location: [place];  Service: ENT;  Laterality: Right;   INTRAVASCULAR ULTRASOUND/IVUS N/A 08/15/2020   Procedure: Intravascular Ultrasound/IVUS;  Surgeon: Jettie Booze, MD;  Location: [place] [facility];  Service: Cardiovascular;  Laterality: N/A;   LEFT HEART CATH AND CORONARY ANGIOGRAPHY N/A 08/15/2020   Procedure: LEFT HEART CATH AND CORONARY ANGIOGRAPHY;  Surgeon: Jettie Booze, MD;  Location: [place] [facility];  Service: Cardiovascular;  Laterality: N/A;   LEFT HEART CATH AND CORONARY ANGIOGRAPHY N/A 01/17/2021   Procedure: LEFT HEART CATH AND CORONARY ANGIOGRAPHY;  Surgeon: Burnell Blanks, MD;  Location: [place] [facility];  Service: Cardiovascular;  Laterality: N/A;   LUMBAR FUSION     L3-5 with rods   MICRODISCECTOMY LUMBAR  2006   SPINAL CORD STIMULATOR INSERTION     blood clot removed from spinal cord   SPINE SURGERY     blood clot removed from spinal cord   XI ROBOTIC ASSISTED INGUINAL HERNIA REPAIR WITH MESH     x 3 surgeries   Social History   Socioeconomic History   Marital status: Married    Spouse name: Not on file   Number of children: 4   Years of education: 16   Highest education level: Not on file  Occupational History   Occupation: disabled/ respiratory therapist  Tobacco Use   Smoking status: Former    Packs/day: 1.00    Years: 30.00    Additional pack  years: 0.00    Total pack years: 30.00    Types: Cigarettes    Quit date: 2007    Years since quitting: 17.2   Smokeless tobacco: Never  Vaping Use   Vaping Use: Never used  Substance and Sexual Activity   Alcohol use: Not Currently   Drug use: Not Currently   Sexual activity: Yes  Other Topics Concern   Not on file  Social History Narrative   Not on file   Social Determinants of Health   Financial Resource Strain:  Not on file  Food Insecurity: No Food Insecurity (07/13/2022)   Hunger Vital Sign    Worried About Running Out of Food in the Last Year: Never true    Ran Out of Food in the Last Year: Never true  Transportation Needs: No Transportation Needs (07/13/2022)   PRAPARE - Hydrologist (Medical): No    Lack of Transportation (Non-Medical): No  Physical Activity: Not on file  Stress: Not on file  Social Connections: Not on file  Intimate Partner Violence: Not At Risk (07/13/2022)   Humiliation, Afraid, Rape, and Kick questionnaire    Fear of Current or Ex-Partner: No    Emotionally Abused: No    Physically Abused: No    Sexually Abused: No   Current Outpatient Medications on File Prior to Visit  Medication Sig Dispense Refill   acetaminophen (TYLENOL) 500 MG tablet Take 1,000 mg by mouth See admin instructions. Take 2 tablets (1000 mg) by mouth scheduled in the morning & may take 2 tablets (1000 mg) by mouth every 6 hours if needed for pain.     albuterol (VENTOLIN HFA) 108 (90 Base) MCG/ACT inhaler Inhale 2 puffs into the lungs as needed for wheezing or shortness of breath (When sick).     AMBULATORY NON FORMULARY MEDICATION Please provide home oxygen for nocturnal use.  Apply 2-4L of O2 via nasal cannula at bedtime.  Dx: Nocturnal Oxygen Desaturation G47.34 1 Device 0   ARIPiprazole (ABILIFY) 10 MG tablet Take 1 tablet (10 mg total) by mouth every morning. 90 tablet 1   atorvastatin (LIPITOR) 80 MG tablet Take 1 tablet (80 mg total) by  mouth daily. 90 tablet 3   b complex vitamins capsule Take 1 capsule by mouth in the morning.     Biotin (BIOTIN MAXIMUM STRENGTH) 10 MG TABS Take 1 tablet by mouth daily.     buprenorphine (BUTRANS) 15 MCG/HR Place 1 patch onto the skin every Friday. 4 patch 2   buPROPion (WELLBUTRIN XL) 150 MG 24 hr tablet Take 1 tablet (150 mg total) by mouth daily in addition to 300mg  tablet. 90 tablet 1   buPROPion (WELLBUTRIN XL) 300 MG 24 hr tablet Take 1 tablet (300 mg total) by mouth daily. 90 tablet 0   clopidogrel (PLAVIX) 75 MG tablet Take 1 tablet (75 mg total) by mouth daily with breakfast. 90 tablet 3   Continuous Blood Gluc Sensor (DEXCOM G6 SENSOR) MISC Use as directed. Change sensor every 10 days 9 each 3   docusate sodium (COLACE) 100 MG capsule Take 2 capsules (200 mg total) by mouth in the morning. 200 capsule 6   empagliflozin (JARDIANCE) 25 MG TABS tablet Take 1 tablet (25 mg total) by mouth daily. 90 tablet 3   GVOKE HYPOPEN 1-PACK 1 MG/0.2ML SOAJ Use as needed for hypoglycemia 0.2 mL PRN   insulin lispro (HUMALOG) 100 UNIT/ML injection For use in pump, total of 60 units per day 60 mL 3   Iron, Ferrous Sulfate, 325 (65 Fe) MG TABS Take 1 tablet (325 mg) by mouth daily. 100 tablet 1   isosorbide mononitrate (IMDUR) 30 MG 24 hr tablet Take 1 tablet (30 mg total) by mouth daily. 90 tablet 3   lidocaine (LIDODERM) 5 % Apply 1 - 3 patches on to the skin as directed. 12 hours on and 12 hours off. 90 patch 2   magnesium oxide (MAG-OX) 400 (240 Mg) MG tablet Take 800 mg by mouth in the morning.  metoCLOPramide (REGLAN) 10 MG tablet Take 1 tablet (10 mg total) by mouth daily as needed for nausea or vomiting (upset stomach). 90 tablet 1   metoprolol succinate (TOPROL XL) 25 MG 24 hr tablet Take 1 tablet (25 mg total) by mouth daily. 90 tablet 3   Multiple Vitamin (MULTI-VITAMIN) tablet Take 1 tablet by mouth daily.     Na Sulfate-K Sulfate-Mg Sulf 17.5-3.13-1.6 GM/177ML SOLN Take by mouth as  directed 354 mL 0   nitroGLYCERIN (NITROSTAT) 0.4 MG SL tablet Place 0.4 mg under the tongue every 5 (five) minutes x 3 doses as needed for chest pain.     ondansetron (ZOFRAN-ODT) 4 MG disintegrating tablet Take 1 tablet (4 mg total) by mouth every 8 (eight) hours as needed for nausea or vomiting. 20 tablet 0   pantoprazole (PROTONIX) 40 MG tablet Take 1 tablet (40 mg total) by mouth daily. 90 tablet 0   polyethylene glycol powder (GLYCOLAX/MIRALAX) 17 GM/SCOOP powder Take 1 Container by mouth as needed for mild constipation.     tamsulosin (FLOMAX) 0.4 MG CAPS capsule Take 1 capsule (0.4 mg total) by mouth daily. 90 capsule 1   testosterone (ANDROGEL) 50 MG/5GM (1%) GEL Place 5 g (1 packet) onto the shoulders and upper arms daily as directed. 150 g 3   tirzepatide (MOUNJARO) 12.5 MG/0.5ML Pen Inject 12.5 mg into the skin once a week. 2 mL 0   tiZANidine (ZANAFLEX) 4 MG tablet Take 1-1&1/2 tablets (4-6 mg total) by mouth every 8 (eight) hours as needed for muscle spasms. 90 tablet 3   No current facility-administered medications on file prior to visit.   Allergies  Allergen Reactions   Kiwi Extract Swelling    Mouth swelling.    Other Swelling and Other (See Comments)    EGGPLANT. Mouth swelling.   Penicillins Rash    Reaction: unknown   Ibuprofen Other (See Comments)    NDC [id number], NDC SX:1888014   Ancef [Cefazolin] Rash   Latex Rash   Levaquin [Levofloxacin] Rash   Peach [Prunus Persica] Other (See Comments)    Burns mouth   Family History  Problem Relation Age of Onset   COPD Mother    Anxiety disorder Mother    Depression Mother    Colon cancer Mother    Cancer Father        mets, origin unknown   Anxiety disorder Father    Depression Father    Coronary artery disease Father    CAD Brother    Leukemia Maternal Grandmother    Multiple sclerosis Daughter    Diabetes Neg Hx    PE: BP (!) 140/90 (BP Location: Right Arm, Patient Position: Sitting, Cuff  Size: Normal)   Pulse 68   Ht 5\' 8"  (1.727 m)   Wt 199 lb 3.2 oz (90.4 kg)   SpO2 96%   BMI 30.29 kg/m  Wt Readings from Last 3 Encounters:  11/12/22 199 lb 3.2 oz (90.4 kg)  10/31/22 198 lb (89.8 kg)  10/28/22 198 lb (89.8 kg)   Constitutional: overweight, in NAD Eyes:  EOMI, no exophthalmos ENT: no neck masses, no cervical lymphadenopathy Cardiovascular: RRR, No MRG Respiratory: CTA B Musculoskeletal: no deformities Skin:no rashes Neurological: no tremor with outstretched hands  ASSESSMENT: 1. DM2, insulin-dependent, controlled, with complications - CAD - s/p 3 stents - Gastroparesis  2. HL  PLAN:  1. Patient with longstanding, fairly well-controlled, type 2 diabetes, on SGLT2 inhibitor, weekly GLP-1/GIP receptor agonist and insulin pump, with good control.  Latest HbA1c was 6.6% obtained last month, increased from HbA1c obtained at last visit: 6.1%. -At last visit, reviewing the CGM trends, sugars appears to be well-controlled throughout the day with higher values after breakfast and lunch.  Upon reviewing his pump and CGM downloads, it appears that he was only relying on automatic boluses all day, with the exception of when he was entering a small amount of carbs and bolusing before meals.  These instances were rare.  However, the fact that he had type 2 rather than type 1 diabetes and he was on Jardiance and Ozempic was most likely the reason why sugars still remain controlled without him bolusing consistently.  I did advise him to try to bolus at least for breakfast.  We relax his insulin to carb ratio throughout the day so that he became more confidence in entering the carbs and bolusing for meals without hypoglycemia. CGM interpretation: -At today's visit, we reviewed his CGM downloads: It appears that 90% of values are in target range (goal >70%), while 9% are higher than 180 (goal <25%), and 1% are lower than 70 (goal <4%).  The calculated average blood sugar is 133.  The  projected HbA1c for the next 3 months (GMI) is 6.5%. -Reviewing the CGM trends, sugars are mostly fluctuating within the target range, with a higher increase after breakfast, and occasional higher blood sugars after the rest of the meals, but the vast majority of the blood sugars are still at goal.  Reviewing the pump downloads, he is usually not bolusing at all for meals, he only receives automatic boluses.  We again discussed about the importance of bolusing before meals, at least if he has larger meals.  He has 1 instance in which his sugars increase significantly after a meal, to 278, so he entered 100 g of carbs and bolus for it.  The sugars decreased, not to hypoglycemia range, but we discussed that if he enters the entire amount of carb after a meal, he may drop his blood sugars too low.  I advised him that if he needs to bolus when the sugars are high postprandially, not to enter the carbs, and only to do a correction.  He agrees with this plan. -After last visit, he had another episode of SBO.  He was then taken off Ozempic.  He recently restarted Mounjaro initially at 10 mg weekly, and now on 12.5 mg weekly tolerated well.  Will continue with this. - I suggested to:  Patient Instructions  Please use the following pump settings: - Basal rates: 12 am: 0.5 units/h - Insulin to carb ratio: 12 am: 1:20 - Target: 12 am: 1:120 - Correction factor (insulin sensitivity factor):  12 am: 100 - Active insulin time: 5h - Changes infusion site: q3 days  Please do the following approximately 15 minutes before every meal: - Enter carbs (C) - Enter sugars (S) - Start insulin bolus (I)  If you bolus insulin after a meal, try to not enter carbs but just correct the high blood sugar.  Please continue: - Jardiance 25 mg before breakfast - Mounjaro 12.5 mg weekly   Please return in 6 months.  - advised to check sugars at different times of the day - 4x a day, rotating check times - advised for  yearly eye exams >> he is UTD - return to clinic in 6 months  2. HL -Reviewed latest lipid panel from 03/2022: LDL at goal, HDL low, triglycerides high: Lab Results  Component Value Date  CHOL 105 03/26/2022   HDL 32 (L) 03/26/2022   LDLCALC 43 03/26/2022   TRIG 256 (H) 03/26/2022   CHOLHDL 3.3 03/26/2022  -He continues Lipitor 80 mg daily without side effects  Philemon Kingdom, MD PhD [facility] Endocrinology

## 2022-11-13 ENCOUNTER — Telehealth: Payer: Self-pay

## 2022-11-13 MED ORDER — AMBULATORY NON FORMULARY MEDICATION: 0 refills | Status: AC

## 2022-11-13 NOTE — Telephone Encounter (Signed)
Rx printed and placed in your basket.    Thanks!  CM

## 2022-11-13 NOTE — Telephone Encounter (Signed)
Patient Brandon [name] requesting Rx for oxygen humidifier be faxed to [employer] @ [phone].  Pt has received Home O2 and states the air is dry and causing nose bleeds.

## 2022-11-14 ENCOUNTER — Other Ambulatory Visit (HOSPITAL_BASED_OUTPATIENT_CLINIC_OR_DEPARTMENT_OTHER): Payer: Self-pay

## 2022-11-14 ENCOUNTER — Other Ambulatory Visit: Payer: Self-pay | Admitting: Family Medicine

## 2022-11-14 MED ORDER — MOUNJARO 12.5 MG/0.5ML ~~LOC~~ SOAJ
12.5000 mg | SUBCUTANEOUS | 0 refills | Status: DC
  Filled 2022-11-14 – 2022-11-18 (×2): qty 2, 28d supply, fill #0

## 2022-11-14 NOTE — Telephone Encounter (Signed)
Attempted phone call to [facility] and left voice mail message to contact triage at [phone]

## 2022-11-14 NOTE — Telephone Encounter (Signed)
Rx faxed to [facility].

## 2022-11-15 ENCOUNTER — Encounter (HOSPITAL_BASED_OUTPATIENT_CLINIC_OR_DEPARTMENT_OTHER): Payer: 59 | Admitting: Internal Medicine

## 2022-11-15 ENCOUNTER — Ambulatory Visit: Payer: 59 | Admitting: Cardiology

## 2022-11-18 ENCOUNTER — Other Ambulatory Visit (HOSPITAL_BASED_OUTPATIENT_CLINIC_OR_DEPARTMENT_OTHER): Payer: Self-pay

## 2022-11-18 NOTE — Telephone Encounter (Signed)
Returned call to [facility] who states this is fine to defer to PCP she was really just wanting [place] to know that any patient with diagnosis of OSA is going to have to go into lab for noctural oxygen use if we are trying o2 supplementation over CPAP. No further questions.

## 2022-11-19 ENCOUNTER — Other Ambulatory Visit (HOSPITAL_BASED_OUTPATIENT_CLINIC_OR_DEPARTMENT_OTHER): Payer: Self-pay

## 2022-11-20 ENCOUNTER — Other Ambulatory Visit (HOSPITAL_BASED_OUTPATIENT_CLINIC_OR_DEPARTMENT_OTHER): Payer: Self-pay

## 2022-11-21 ENCOUNTER — Encounter: Payer: Self-pay | Admitting: Cardiology

## 2022-11-21 ENCOUNTER — Encounter: Payer: Self-pay | Admitting: Family Medicine

## 2022-11-24 ENCOUNTER — Encounter: Payer: Self-pay | Admitting: Family Medicine

## 2022-11-25 ENCOUNTER — Other Ambulatory Visit (HOSPITAL_BASED_OUTPATIENT_CLINIC_OR_DEPARTMENT_OTHER): Payer: Self-pay

## 2022-11-26 ENCOUNTER — Other Ambulatory Visit (HOSPITAL_BASED_OUTPATIENT_CLINIC_OR_DEPARTMENT_OTHER): Payer: Self-pay

## 2022-11-27 ENCOUNTER — Other Ambulatory Visit (HOSPITAL_BASED_OUTPATIENT_CLINIC_OR_DEPARTMENT_OTHER): Payer: Self-pay

## 2022-11-27 ENCOUNTER — Other Ambulatory Visit: Payer: Self-pay

## 2022-11-27 NOTE — Progress Notes (Signed)
error 

## 2022-11-28 ENCOUNTER — Other Ambulatory Visit (HOSPITAL_BASED_OUTPATIENT_CLINIC_OR_DEPARTMENT_OTHER): Payer: Self-pay

## 2022-11-28 DIAGNOSIS — G4733 Obstructive sleep apnea (adult) (pediatric): Secondary | ICD-10-CM | POA: Diagnosis not present

## 2022-11-28 NOTE — Telephone Encounter (Signed)
I called patient and advised him the insurance will not pay for a portable concentrator for nighttime use. He would have to be on all day oxygen.

## 2022-11-29 ENCOUNTER — Other Ambulatory Visit (HOSPITAL_BASED_OUTPATIENT_CLINIC_OR_DEPARTMENT_OTHER): Payer: Self-pay

## 2022-11-29 ENCOUNTER — Ambulatory Visit: Payer: 59 | Admitting: Cardiology

## 2022-11-29 NOTE — Progress Notes (Unsigned)
  rtCareThis encounter was created in error - please disregard.

## 2022-12-03 ENCOUNTER — Other Ambulatory Visit (HOSPITAL_BASED_OUTPATIENT_CLINIC_OR_DEPARTMENT_OTHER): Payer: Self-pay

## 2022-12-04 LAB — HM DIABETES EYE EXAM

## 2022-12-05 ENCOUNTER — Other Ambulatory Visit (HOSPITAL_BASED_OUTPATIENT_CLINIC_OR_DEPARTMENT_OTHER): Payer: Self-pay

## 2022-12-05 ENCOUNTER — Telehealth: Payer: Self-pay

## 2022-12-05 ENCOUNTER — Encounter: Payer: Self-pay | Admitting: Family Medicine

## 2022-12-05 MED ORDER — MOUNJARO 12.5 MG/0.5ML ~~LOC~~ SOAJ
12.5000 mg | SUBCUTANEOUS | 0 refills | Status: DC
  Filled 2022-12-05: qty 2, 28d supply, fill #0
  Filled 2022-12-26 – 2022-12-30 (×3): qty 2, 28d supply, fill #1
  Filled 2023-01-23: qty 2, 28d supply, fill #2

## 2022-12-05 NOTE — Telephone Encounter (Signed)
Called and left messages for patient and spouse (DPR) regarding scheduling appt w/ Inspire Rep. Also sent Mychart message.

## 2022-12-07 ENCOUNTER — Encounter: Payer: Self-pay | Admitting: Internal Medicine

## 2022-12-08 DIAGNOSIS — G4734 Idiopathic sleep related nonobstructive alveolar hypoventilation: Secondary | ICD-10-CM | POA: Diagnosis not present

## 2022-12-09 DIAGNOSIS — E119 Type 2 diabetes mellitus without complications: Secondary | ICD-10-CM | POA: Diagnosis not present

## 2022-12-12 ENCOUNTER — Other Ambulatory Visit (HOSPITAL_BASED_OUTPATIENT_CLINIC_OR_DEPARTMENT_OTHER): Payer: Self-pay

## 2022-12-18 NOTE — Anesthesia Preprocedure Evaluation (Signed)
Anesthesia Evaluation  Patient identified by MRN, date of birth, ID band Patient awake    Reviewed: Allergy & Precautions, NPO status , Patient's Chart, lab work & pertinent test results  History of Anesthesia Complications (+) PONV and history of anesthetic complications  Airway Mallampati: III  TM Distance: >3 FB Neck ROM: Full   Comment: Previous grade I view with MAC 3, easy mask Dental  (+) Dental Advisory Given, Upper Dentures, Lower Dentures   Pulmonary neg shortness of breath, sleep apnea (Inspire device) , neg COPD, neg recent URI, former smoker   Pulmonary exam normal breath sounds clear to auscultation       Cardiovascular hypertension (ISMN, metoprolol), Pt. on medications and Pt. on home beta blockers (-) angina + CAD and + Cardiac Stents   Rhythm:Regular Rate:Normal  HLD  TTE 11/01/2021: IMPRESSIONS     1. Left ventricular ejection fraction, by estimation, is 60 to 65%. The  left ventricle has normal function. The left ventricle has no regional  wall motion abnormalities. There is moderate concentric left ventricular  hypertrophy. Left ventricular  diastolic parameters are consistent with Grade I diastolic dysfunction  (impaired relaxation).   2. Right ventricular systolic function is normal. The right ventricular  size is normal. Tricuspid regurgitation signal is inadequate for assessing  PA pressure.   3. The mitral valve is normal in structure. No evidence of mitral valve  regurgitation. No evidence of mitral stenosis.   4. The aortic valve is tricuspid. Aortic valve regurgitation is not  visualized.   5. The inferior vena cava is normal in size with greater than 50%  respiratory variability, suggesting right atrial pressure of 3 mmHg.   LHC 01/17/2021: 1. Non-obstructive disease in the LAD. Diffuse disease in the small to moderate caliber Diagonal branch, unchanged from last cath and not a good target for  PCI.  2. Severe proximal Circumflex stenosis. Severe mid Circumflex stenosis. Chronic occlusion OM1. Severe diffuse disease in the small caliber second OM branch.  4. Patent proximal to mid RCA stent. Severe disease in the small caliber PDA, unchanged from last cath.  5. Successful PTCA/DES x 1 proximal Circumflex 6. Successful PTCA/DES x 1 mid Circumflex   Recommendations: Continue ASA/Plavix for at least one year.      Neuro/Psych neg Seizures PSYCHIATRIC DISORDERS Anxiety Depression     Neuromuscular disease (chronic back pain with SCS)    GI/Hepatic Bowel prep,GERD  Medicated,,(+) Hepatitis -, B  Endo/Other  diabetes (Hgb A1c 6.6; insulin pump at basal rate), Well Controlled, Type 2, Oral Hypoglycemic Agents, Insulin Dependent    Renal/GU negative Renal ROS     Musculoskeletal  (+) Arthritis , Osteoarthritis,  Fibromyalgia -  Abdominal   Peds  Hematology negative hematology ROS (+)   Anesthesia Other Findings On buprenorphine  Last Plavix: 12/10/2022  Last Mounjaro: 12/11/2022  Reproductive/Obstetrics                             Anesthesia Physical Anesthesia Plan  ASA: 3  Anesthesia Plan: MAC   Post-op Pain Management:    Induction: Intravenous  PONV Risk Score and Plan: 2 and Propofol infusion and Treatment may vary due to age or medical condition  Airway Management Planned: Natural Airway and Nasal Cannula  Additional Equipment:   Intra-op Plan:   Post-operative Plan:   Informed Consent: I have reviewed the patients History and Physical, chart, labs and discussed the procedure including the risks, benefits  and alternatives for the proposed anesthesia with the patient or authorized representative who has indicated his/her understanding and acceptance.       Plan Discussed with: Anesthesiologist  Anesthesia Plan Comments: (Discussed with patient risks of MAC including, but not limited to, minor pain or discomfort, hearing  people in the room, and possible need for backup general anesthesia. Risks for general anesthesia also discussed including, but not limited to, sore throat, hoarse voice, chipped/damaged teeth, injury to vocal cords, nausea and vomiting, allergic reactions, lung infection, heart attack, stroke, and death. All questions answered. )       Anesthesia Quick Evaluation

## 2022-12-19 ENCOUNTER — Encounter (HOSPITAL_COMMUNITY): Payer: Self-pay | Admitting: Gastroenterology

## 2022-12-19 ENCOUNTER — Telehealth: Payer: Self-pay | Admitting: Gastroenterology

## 2022-12-19 ENCOUNTER — Encounter (HOSPITAL_COMMUNITY)
Admission: RE | Disposition: A | Discharge status: Home / Self Care | Payer: Self-pay | Source: Home / Self Care | Attending: Gastroenterology

## 2022-12-19 ENCOUNTER — Ambulatory Visit (HOSPITAL_BASED_OUTPATIENT_CLINIC_OR_DEPARTMENT_OTHER): Payer: 59 | Admitting: Anesthesiology

## 2022-12-19 ENCOUNTER — Other Ambulatory Visit: Payer: Self-pay

## 2022-12-19 ENCOUNTER — Ambulatory Visit (HOSPITAL_COMMUNITY)
Admission: RE | Admit: 2022-12-19 | Discharge: 2022-12-19 | Disposition: A | Discharge status: Home / Self Care | Payer: 59 | Attending: Gastroenterology | Admitting: Gastroenterology

## 2022-12-19 ENCOUNTER — Ambulatory Visit (HOSPITAL_COMMUNITY): Payer: 59 | Admitting: Anesthesiology

## 2022-12-19 ENCOUNTER — Other Ambulatory Visit (HOSPITAL_BASED_OUTPATIENT_CLINIC_OR_DEPARTMENT_OTHER): Payer: Self-pay

## 2022-12-19 DIAGNOSIS — D123 Benign neoplasm of transverse colon: Secondary | ICD-10-CM | POA: Insufficient documentation

## 2022-12-19 DIAGNOSIS — Z1211 Encounter for screening for malignant neoplasm of colon: Secondary | ICD-10-CM | POA: Diagnosis not present

## 2022-12-19 DIAGNOSIS — I10 Essential (primary) hypertension: Secondary | ICD-10-CM | POA: Diagnosis not present

## 2022-12-19 DIAGNOSIS — E119 Type 2 diabetes mellitus without complications: Secondary | ICD-10-CM | POA: Insufficient documentation

## 2022-12-19 DIAGNOSIS — F419 Anxiety disorder, unspecified: Secondary | ICD-10-CM | POA: Insufficient documentation

## 2022-12-19 DIAGNOSIS — I2510 Atherosclerotic heart disease of native coronary artery without angina pectoris: Secondary | ICD-10-CM

## 2022-12-19 DIAGNOSIS — Z794 Long term (current) use of insulin: Secondary | ICD-10-CM | POA: Insufficient documentation

## 2022-12-19 DIAGNOSIS — F32A Depression, unspecified: Secondary | ICD-10-CM | POA: Diagnosis not present

## 2022-12-19 DIAGNOSIS — Z09 Encounter for follow-up examination after completed treatment for conditions other than malignant neoplasm: Secondary | ICD-10-CM | POA: Insufficient documentation

## 2022-12-19 DIAGNOSIS — G8929 Other chronic pain: Secondary | ICD-10-CM | POA: Insufficient documentation

## 2022-12-19 DIAGNOSIS — Z7984 Long term (current) use of oral hypoglycemic drugs: Secondary | ICD-10-CM | POA: Diagnosis not present

## 2022-12-19 DIAGNOSIS — Z955 Presence of coronary angioplasty implant and graft: Secondary | ICD-10-CM | POA: Diagnosis not present

## 2022-12-19 DIAGNOSIS — Z87891 Personal history of nicotine dependence: Secondary | ICD-10-CM

## 2022-12-19 DIAGNOSIS — K5730 Diverticulosis of large intestine without perforation or abscess without bleeding: Secondary | ICD-10-CM | POA: Insufficient documentation

## 2022-12-19 DIAGNOSIS — Z7902 Long term (current) use of antithrombotics/antiplatelets: Secondary | ICD-10-CM | POA: Insufficient documentation

## 2022-12-19 DIAGNOSIS — Z7985 Long-term (current) use of injectable non-insulin antidiabetic drugs: Secondary | ICD-10-CM | POA: Diagnosis not present

## 2022-12-19 DIAGNOSIS — D126 Benign neoplasm of colon, unspecified: Secondary | ICD-10-CM | POA: Diagnosis not present

## 2022-12-19 DIAGNOSIS — M797 Fibromyalgia: Secondary | ICD-10-CM | POA: Diagnosis not present

## 2022-12-19 DIAGNOSIS — K219 Gastro-esophageal reflux disease without esophagitis: Secondary | ICD-10-CM | POA: Diagnosis not present

## 2022-12-19 DIAGNOSIS — E785 Hyperlipidemia, unspecified: Secondary | ICD-10-CM | POA: Insufficient documentation

## 2022-12-19 DIAGNOSIS — Z860100 Personal history of colon polyps, unspecified: Secondary | ICD-10-CM

## 2022-12-19 DIAGNOSIS — Z800 Family history of malignant neoplasm of digestive organs: Secondary | ICD-10-CM

## 2022-12-19 DIAGNOSIS — F418 Other specified anxiety disorders: Secondary | ICD-10-CM | POA: Diagnosis not present

## 2022-12-19 HISTORY — PX: COLONOSCOPY WITH PROPOFOL: SHX5780

## 2022-12-19 HISTORY — PX: POLYPECTOMY: SHX5525

## 2022-12-19 LAB — GLUCOSE, CAPILLARY
Glucose-Capillary: 98 mg/dL (ref 70–99)
Glucose-Capillary: 95 mg/dL (ref 70–99)

## 2022-12-19 SURGERY — COLONOSCOPY WITH PROPOFOL
Anesthesia: Monitor Anesthesia Care

## 2022-12-19 MED ORDER — PROPOFOL 10 MG/ML IV BOLUS
INTRAVENOUS | Status: DC | PRN
  Administered 2022-12-19 (×2): 40 mg via INTRAVENOUS

## 2022-12-19 MED ORDER — PROPOFOL 500 MG/50ML IV EMUL
INTRAVENOUS | Status: DC | PRN
  Administered 2022-12-19: 150 ug/kg/min via INTRAVENOUS

## 2022-12-19 MED ORDER — LACTATED RINGERS IV SOLN: INTRAVENOUS | Status: DC | PRN

## 2022-12-19 MED ORDER — SODIUM CHLORIDE 0.9 % IV SOLN: INTRAVENOUS | Status: DC

## 2022-12-19 MED ORDER — CLOPIDOGREL BISULFATE 75 MG PO TABS
75.0000 mg | ORAL_TABLET | Freq: Every day | ORAL | 3 refills | Status: AC
  Filled 2022-12-19 – 2023-02-18 (×2): qty 90, 90d supply, fill #0
  Filled 2023-05-19: qty 90, 90d supply, fill #1
  Filled 2023-08-15 – 2023-11-03 (×3): qty 90, 90d supply, fill #2

## 2022-12-19 SURGICAL SUPPLY — 22 items

## 2022-12-19 NOTE — Telephone Encounter (Signed)
Letter sent to pt via mychart. °

## 2022-12-19 NOTE — Anesthesia Postprocedure Evaluation (Signed)
Anesthesia Post Note  Patient: Brandon [name]  Procedure(s) Performed: COLONOSCOPY WITH PROPOFOL POLYPECTOMY     Patient location during evaluation: PACU Anesthesia Type: MAC Level of consciousness: awake Pain management: pain level controlled Vital Signs Assessment: post-procedure vital signs reviewed and stable Respiratory status: spontaneous breathing, nonlabored ventilation and respiratory function stable Cardiovascular status: stable and blood pressure returned to baseline Postop Assessment: no apparent nausea or vomiting Anesthetic complications: no   No notable events documented.  Last Vitals:  Vitals:   12/19/22 0940 12/19/22 0950  BP: 133/70 (!) 141/61  Pulse: 67 (!) 58  Resp: 15 16  Temp:    SpO2: 96% 97%    Last Pain:  Vitals:   12/19/22 0950  TempSrc:   PainSc: 0-No pain                 Linton Rump

## 2022-12-19 NOTE — H&P (Signed)
[facility] History and Physical   Primary Care Physician:  Everrett Coombe, DO   Reason for Procedure:   Colon polyp surveillance, questionable family history of colon cancer  Plan:    Colonoscopy     HPI: Brandon [name] is a 67 y.o. male undergoing screening/surveillance colonoscopy.  He has no chronic GI symptoms. He had a colonoscopy about 10 years ago in [place], [place] with polyps removed and a recommendation to repeat in 5 years.  His mother had an intra-abdominal cancer that involved her sigmoid which was treated with surgery, no chemotherapy or radiation.  He takes plavix for a history of CAD s/p stenting, most recently in 2022, last dose April 16th   Past Medical History:  Diagnosis Date   Anxiety    Arthritis    Basal cell carcinoma (BCC) in situ of skin    left neck   Chronic back pain    has spinal cord stimulator and wears buprenorphine patch   Colon polyps    Coronary artery disease    S/p DES to mRCA in 12/21 // S/p DES to LCx x 2 in 12/2020 // Diff dz in small to mod Dx (not a good target for PCI); OM1 100 CTO; severe dz in small OM2 and PDA >> Med Rx   Depression    Diabetes mellitus without complication    type 2   Fibromyalgia    Gallstones    GERD (gastroesophageal reflux disease)    Hepatitis B    HLD (hyperlipidemia)    Hypertension    Osteoarthritis    Pneumonia    PONV (postoperative nausea and vomiting)    SBO (small bowel obstruction)    Sleep apnea    does not use CPAP    Past Surgical History:  Procedure Laterality Date   CARDIAC CATHETERIZATION  01/17/2021   CATARACT EXTRACTION W/ INTRAOCULAR LENS  IMPLANT, BILATERAL     CHOLECYSTECTOMY     CHOLECYSTECTOMY, LAPAROSCOPIC     CORONARY STENT INTERVENTION N/A 08/15/2020   Procedure: CORONARY STENT INTERVENTION;  Surgeon: Corky Crafts, MD;  Location: [facility];  Service: Cardiovascular;  Laterality: N/A;   CORONARY STENT INTERVENTION N/A 01/17/2021   Procedure:  CORONARY STENT INTERVENTION;  Surgeon: Kathleene Hazel, MD;  Location: [facility];  Service: Cardiovascular;  Laterality: N/A;   CORONARY ULTRASOUND/IVUS N/A 08/15/2020   Procedure: Intravascular Ultrasound/IVUS;  Surgeon: Corky Crafts, MD;  Location: [facility]ASIVE CV [facility];  Service: Cardiovascular;  Laterality: N/A;   DRUG INDUCED ENDOSCOPY N/A 05/23/2022   Procedure: DRUG INDUCED SLEEP ENDOSCOPY;  Surgeon: Osborn Coho, MD;  Location: [facility] OR;  Service: ENT;  Laterality: N/A;   HERNIA REPAIR     IMPLANTATION OF HYPOGLOSSAL NERVE STIMULATOR Right 07/30/2022   Procedure: IMPLANTATION OF HYPOGLOSSAL NERVE STIMULATOR;  Surgeon: Osborn Coho, MD;  Location: [facility];  Service: ENT;  Laterality: Right;   LEFT HEART CATH AND CORONARY ANGIOGRAPHY N/A 08/15/2020   Procedure: LEFT HEART CATH AND CORONARY ANGIOGRAPHY;  Surgeon: Corky Crafts, MD;  Location: [facility] INVASIVE CV LAB;  Service: Cardiovascular;  Laterality: N/A;   LEFT HEART CATH AND CORONARY ANGIOGRAPHY N/A 01/17/2021   Procedure: LEFT HEART CATH AND CORONARY ANGIOGRAPHY;  Surgeon: Kathleene Hazel, MD;  Location: [facility];  Service: Cardiovascular;  Laterality: N/A;   LUMBAR FUSION     L3-5 with rods   MICRODISCECTOMY LUMBAR  2006   SPINAL CORD STIMULATOR INSERTION  blood clot removed from spinal cord   SPINE SURGERY     blood clot removed from spinal cord   XI ROBOTIC ASSISTED INGUINAL HERNIA REPAIR WITH MESH     x 3 surgeries    Prior to Admission medications   Medication Sig Start Date End Date Taking? Authorizing Provider  acetaminophen (TYLENOL) 500 MG tablet Take 1,000 mg by mouth See admin instructions. Take 2 tablets (1000 mg) by mouth scheduled in the morning & may take 2 tablets (1000 mg) by mouth every 6 hours if needed for pain.   Yes [provider]  AMBULATORY NON FORMULARY MEDICATION Please provide home oxygen for nocturnal use.  Apply 2-4L of O2  via nasal cannula at bedtime.  Dx: Nocturnal Oxygen Desaturation G47.34 11/06/22  Yes Everrett Coombe, DO  ARIPiprazole (ABILIFY) 10 MG tablet Take 1 tablet (10 mg total) by mouth every morning. 10/22/22  Yes Everrett Coombe, DO  atorvastatin (LIPITOR) 80 MG tablet Take 1 tablet (80 mg total) by mouth daily. 08/21/22  Yes Meriam Sprague, MD  b complex vitamins capsule Take 1 capsule by mouth in the morning.   Yes [provider]  Biotin (BIOTIN MAXIMUM STRENGTH) 10 MG TABS Take 1 tablet by mouth daily. 06/19/22  Yes [provider]  buprenorphine Lavera Guise) 15 MCG/HR Place 1 patch onto the skin every Friday. 10/18/22 01/10/23 Yes Edward Jolly, MD  buPROPion (WELLBUTRIN XL) 150 MG 24 hr tablet Take 1 tablet (150 mg total) by mouth daily in addition to 300mg  tablet. 09/26/22  Yes Everrett Coombe, DO  buPROPion (WELLBUTRIN XL) 300 MG 24 hr tablet Take 1 tablet (300 mg total) by mouth daily. 10/22/22  Yes Everrett Coombe, DO  clopidogrel (PLAVIX) 75 MG tablet Take 1 tablet (75 mg total) by mouth daily with breakfast. 08/21/22  Yes Meriam Sprague, MD  docusate sodium (COLACE) 100 MG capsule Take 2 capsules (200 mg total) by mouth in the morning. 10/24/22  Yes Everrett Coombe, DO  empagliflozin (JARDIANCE) 25 MG TABS tablet Take 1 tablet (25 mg total) by mouth daily. 10/22/22  Yes Everrett Coombe, DO  GVOKE HYPOPEN 1-PACK 1 MG/0.2ML SOAJ Use as needed for hypoglycemia 05/13/22  Yes Carlus Pavlov, MD  insulin lispro (HUMALOG) 100 UNIT/ML injection For use in pump, total of 60 units per day 09/13/22  Yes Carlus Pavlov, MD  Iron, Ferrous Sulfate, 325 (65 Fe) MG TABS Take 1 tablet (325 mg) by mouth daily. 08/21/22  Yes Everrett Coombe, DO  isosorbide mononitrate (IMDUR) 30 MG 24 hr tablet Take 1 tablet (30 mg total) by mouth daily. 08/21/22  Yes Meriam Sprague, MD  metoprolol succinate (TOPROL XL) 25 MG 24 hr tablet Take 1 tablet (25 mg total) by mouth daily. 08/21/22  Yes Meriam Sprague, MD  Multiple Vitamin (MULTI-VITAMIN) tablet Take 1 tablet by mouth daily. 04/16/22  Yes [provider]  nitroGLYCERIN (NITROSTAT) 0.4 MG SL tablet Place 0.4 mg under the tongue every 5 (five) minutes x 3 doses as needed for chest pain.   Yes [provider]  pantoprazole (PROTONIX) 40 MG tablet Take 1 tablet (40 mg total) by mouth daily. 10/22/22  Yes Everrett Coombe, DO  tamsulosin (FLOMAX) 0.4 MG CAPS capsule Take 1 capsule (0.4 mg total) by mouth daily. 05/02/22  Yes Everrett Coombe, DO  testosterone (ANDROGEL) 50 MG/5GM (1%) GEL Place 5 g (1 packet) onto the shoulders and upper arms daily as directed. 10/25/22 12/27/22 Yes Everrett Coombe, DO  tirzepatide [facility]) 12.5  MG/0.5ML Pen Inject 12.5 mg into the skin once a week. 12/05/22  Yes Everrett Coombe, DO  tiZANidine (ZANAFLEX) 4 MG tablet Take 1-1&1/2 tablets (4-6 mg total) by mouth every 8 (eight) hours as needed for muscle spasms. 09/04/22  Yes Turner, Cornelious Bryant, MD  albuterol (VENTOLIN HFA) 108 (90 Base) MCG/ACT inhaler Inhale 2 puffs into the lungs as needed for wheezing or shortness of breath (When sick). 10/29/21   [provider]  AMBULATORY NON FORMULARY MEDICATION Please provide humidifier for home oxygen due to nasal irritation with use. 11/13/22   Everrett Coombe, DO  Continuous Blood Gluc Sensor (DEXCOM G6 SENSOR) MISC Use as directed. Change sensor every 10 days 01/02/22   Shamleffer, Konrad Dolores, MD  magnesium oxide (MAG-OX) 400 (240 Mg) MG tablet Take 800 mg by mouth in the morning.    [provider]  metoCLOPramide (REGLAN) 10 MG tablet Take 1 tablet (10 mg total) by mouth daily as needed for nausea or vomiting (upset stomach). 10/24/22   Everrett Coombe, DO  Na Sulfate-K Sulfate-Mg Sulf 17.5-3.13-1.6 GM/177ML SOLN Take by mouth as directed 03/13/22   Jenel Lucks, MD  ondansetron (ZOFRAN-ODT) 4 MG disintegrating tablet Take 1 tablet (4 mg total) by mouth every 8 (eight) hours as needed for  nausea or vomiting. 05/02/22   Everrett Coombe, DO  polyethylene glycol powder (GLYCOLAX/MIRALAX) 17 GM/SCOOP powder Take 17 g by mouth daily as needed for mild constipation. 04/16/22   [provider]    Current Facility-Administered Medications  Medication Dose Route Frequency Provider Last Rate Last Admin   0.9 %  sodium chloride infusion   Intravenous Continuous Jenel Lucks, MD        Allergies as of 11/01/2022 - Review Complete 10/31/2022  Allergen Reaction Noted   Kiwi extract Swelling 11/06/2015   Other Swelling and Other (See Comments) 04/09/2017   Penicillins Rash 10/09/2015   Ibuprofen Other (See Comments) 10/12/2020   Ancef [cefazolin] Rash 04/06/2020   Latex Rash 04/06/2020   Levaquin [levofloxacin] Rash 04/06/2020   Peach [prunus persica] Other (See Comments) 04/03/2021    Family History  Problem Relation Age of Onset   COPD Mother    Anxiety disorder Mother    Depression Mother    Colon cancer Mother    Cancer Father        mets, origin unknown   Anxiety disorder Father    Depression Father    Coronary artery disease Father    CAD Brother    Leukemia Maternal Grandmother    Multiple sclerosis Daughter    Diabetes Neg Hx     Social History   Socioeconomic History   Marital status: Married    Spouse name: Not on file   Number of children: 4   Years of education: 16   Highest education level: Not on file  Occupational History   Occupation: disabled/ respiratory therapist  Tobacco Use   Smoking status: Former    Packs/day: 1.00    Years: 30.00    Additional pack years: 0.00    Total pack years: 30.00    Types: Cigarettes    Quit date: 2007    Years since quitting: 17.3   Smokeless tobacco: Never  Vaping Use   Vaping Use: Never used  Substance and Sexual Activity   Alcohol use: Not Currently   Drug use: Not Currently   Sexual activity: Yes  Other Topics Concern   Not on file  Social History Narrative   Not on  file   Social  Determinants of Health   Financial Resource Strain: Not on file  Food Insecurity: No Food Insecurity (07/13/2022)   Hunger Vital Sign    Worried About Running Out of Food in the Last Year: Never true    Ran Out of Food in the Last Year: Never true  Transportation Needs: No Transportation Needs (07/13/2022)   PRAPARE - Administrator, Civil Service (Medical): No    Lack of Transportation (Non-Medical): No  Physical Activity: Not on file  Stress: Not on file  Social Connections: Not on file  Intimate Partner Violence: Not At Risk (07/13/2022)   Humiliation, Afraid, Rape, and Kick questionnaire    Fear of Current or Ex-Partner: No    Emotionally Abused: No    Physically Abused: No    Sexually Abused: No    Review of Systems:  All other review of systems negative except as mentioned in the HPI.  Physical Exam: Vital signs BP (!) 150/61   Pulse 61   Temp 98.2 F (36.8 C) (Temporal)   Resp 15   Ht  (1.727 m)   Wt 87.5 kg   SpO2 98%   BMI 29.35 kg/m   General:   Alert,  Well-developed, well-nourished, pleasant and cooperative in NAD Airway:  Mallampati 2 Lungs:  Clear throughout to auscultation.   Heart:  Regular rate and rhythm; no murmurs, clicks, rubs,  or gallops. Abdomen:  Soft, nontender and nondistended. Normal bowel sounds.   Neuro/Psych:  Normal mood and affect. A and O x 3   Kaiser Belluomini E. Tomasa Rand, MD [facility]

## 2022-12-19 NOTE — Transfer of Care (Signed)
Immediate Anesthesia Transfer of Care Note  Patient: Brandon [name]  Procedure(s) Performed: COLONOSCOPY WITH PROPOFOL POLYPECTOMY  Patient Location: Endoscopy Unit  Anesthesia Type:MAC  Level of Consciousness: awake, alert , and oriented  Airway & Oxygen Therapy: Patient Spontanous Breathing  Post-op Assessment: Report given to RN, Post -op Vital signs reviewed and stable, Patient moving all extremities X 4, and Patient able to stick tongue midline  Post vital signs: Reviewed  Last Vitals:  Vitals Value Taken Time  BP 141/69   Temp 98.1   Pulse 68   Resp 19 12/19/22 0923  SpO2 100   Vitals shown include unvalidated device data.  Last Pain:  Vitals:   12/19/22 0734  TempSrc: Temporal  PainSc: 3          Complications: No notable events documented.

## 2022-12-19 NOTE — Telephone Encounter (Signed)
Patient is calling states he needs a work note for him to return to work on Saturday. Please advise

## 2022-12-19 NOTE — Discharge Instructions (Signed)
YOU HAD AN ENDOSCOPIC PROCEDURE TODAY: Refer to the procedure report and other information in the discharge instructions given to you for any specific questions about what was found during the examination. If this information does not answer your questions, please call [place] office at [phone] to clarify.  ° °YOU SHOULD EXPECT: Some feelings of bloating in the abdomen. Passage of more gas than usual. Walking can help get rid of the air that was put into your GI tract during the procedure and reduce the bloating. If you had a lower endoscopy (such as a colonoscopy or flexible sigmoidoscopy) you may notice spotting of blood in your stool or on the toilet paper. Some abdominal soreness may be present for a day or two, also. ° °DIET: Your first meal following the procedure should be a light meal and then it is ok to progress to your normal diet. A half-sandwich or bowl of soup is an example of a good first meal. Heavy or fried foods are harder to digest and may make you feel nauseous or bloated. Drink plenty of fluids but you should avoid alcoholic beverages for 24 hours. If you had a esophageal dilation, please see attached instructions for diet.   ° °ACTIVITY: Your care partner should take you home directly after the procedure. You should plan to take it easy, moving slowly for the rest of the day. You can resume normal activity the day after the procedure however YOU SHOULD NOT DRIVE, use power tools, machinery or perform tasks that involve climbing or major physical exertion for 24 hours (because of the sedation medicines used during the test).  ° °SYMPTOMS TO REPORT IMMEDIATELY: °A gastroenterologist can be reached at any hour. Please call [phone]  for any of the following symptoms:  °Following lower endoscopy (colonoscopy, flexible sigmoidoscopy) °Excessive amounts of blood in the stool  °Significant tenderness, worsening of abdominal pains  °Swelling of the abdomen that is new, acute  °Fever of 100° or  higher  °Following upper endoscopy (EGD, EUS, ERCP, esophageal dilation) °Vomiting of blood or coffee ground material  °New, significant abdominal pain  °New, significant chest pain or pain under the shoulder blades  °Painful or persistently difficult swallowing  °New shortness of breath  °Black, tarry-looking or red, bloody stools ° °FOLLOW UP:  °If any biopsies were taken you will be contacted by phone or by letter within the next 1-3 weeks. Call [phone]  if you have not heard about the biopsies in 3 weeks.  °Please also call with any specific questions about appointments or follow up tests. ° °

## 2022-12-19 NOTE — Op Note (Signed)
[facility] Patient Name: Brandon [name] Procedure Date : 12/19/2022 MRN: [id number] Attending MD: Dub Amis. Tomasa Rand , MD, [id number] Date of Birth: [birth year] CSN: [id number] Age: 67 Admit Type: Outpatient Procedure:                Colonoscopy Indications:              Surveillance: Personal history of colonic polyps                            (unknown histology) on last colonoscopy more than 5                            years ago Providers:                Lorin Picket E. Tomasa Rand, MD, Trudee Kuster, RN,                            Kandice Robinsons, Technician Referring MD:              Medicines:                Monitored Anesthesia Care Complications:            No immediate complications. Estimated Blood Loss:     Estimated blood loss was minimal. Procedure:                Pre-Anesthesia Assessment:                           - Prior to the procedure, a History and Physical                            was performed, and patient medications and                            allergies were reviewed. The patient's tolerance of                            previous anesthesia was also reviewed. The risks                            and benefits of the procedure and the sedation                            options and risks were discussed with the patient.                            All questions were answered, and informed consent                            was obtained. Prior Anticoagulants: The patient has                            taken Plavix (clopidogrel), last dose was 9 days  prior to procedure. ASA Grade Assessment: III - A                            patient with severe systemic disease. After                            reviewing the risks and benefits, the patient was                            deemed in satisfactory condition to undergo the                            procedure.                           After obtaining informed consent, the  colonoscope                            was passed under direct vision. Throughout the                            procedure, the patient's blood pressure, pulse, and                            oxygen saturations were monitored continuously. The                            [id number]-HQ190L ([id number] Olympus coloscope was                            introduced through the anus and advanced to the the                            cecum, identified by appendiceal orifice and                            ileocecal valve. The colonoscopy was unusually                            difficult due to a redundant colon and significant                            looping. Successful completion of the procedure was                            aided by changing the patient to a supine position,                            using manual pressure and withdrawing and                            reinserting the scope. The patient tolerated the  procedure well. The quality of the bowel                            preparation was adequate. The ileocecal valve,                            appendiceal orifice, and rectum were photographed.                            The bowel preparation used was SUPREP via split                            dose instruction. Scope In: 8:43:41 AM Scope Out: 9:16:19 AM Scope Withdrawal Time: 0 hours 17 minutes 20 seconds  Total Procedure Duration: 0 hours 32 minutes 38 seconds  Findings:      The perianal and digital rectal examinations were normal. Pertinent       negatives include normal sphincter tone and no palpable rectal lesions.      A 3 mm polyp was found in the transverse colon. The polyp was sessile.       The polyp was removed with a cold snare. Resection and retrieval were       complete. Estimated blood loss was minimal.      Many large-mouthed and small-mouthed diverticula were found in the       sigmoid colon, descending colon, transverse colon and ascending  colon.       There was no evidence of diverticular bleeding.      The exam was otherwise normal throughout the examined colon.      The retroflexed view of the distal rectum and anal verge was normal and       showed no anal or rectal abnormalities. Impression:               - One 3 mm polyp in the transverse colon, removed                            with a cold snare. Resected and retrieved.                           - Mild diverticulosis in the sigmoid colon, in the                            descending colon, in the transverse colon and in                            the ascending colon. There was no evidence of                            diverticular bleeding.                           - The distal rectum and anal verge are normal on                            retroflexion view. Moderate Sedation:      N/A Recommendation:           -  Patient has a contact number available for                            emergencies. The signs and symptoms of potential                            delayed complications were discussed with the                            patient. Return to normal activities tomorrow.                            Written discharge instructions were provided to the                            patient.                           - Resume previous diet.                           - Resume Plavix (clopidogrel) at prior dose                            tomorrow.                           - Await pathology results.                           - Repeat colonoscopy (date not yet determined) for                            surveillance based on pathology results.                           - Recommend use of abdominal binder for next                            colonoscopy to reduce looping. Procedure Code(s):        --- Professional ---                           [phone], Colonoscopy, flexible; with removal of                            tumor(s), polyp(s), or other lesion(s) by snare                             technique Diagnosis Code(s):        --- Professional ---                           Z86.010, Personal history of colonic polyps                           D12.3, Benign  neoplasm of transverse colon (hepatic                            flexure or splenic flexure)                           K57.30, Diverticulosis of large intestine without                            perforation or abscess without bleeding CPT copyright 2022 American Medical Association. All rights reserved. The codes documented in this report are preliminary and upon coder review may  be revised to meet current compliance requirements. [facility] E. Tomasa Rand, MD 12/19/2022 9:24:32 AM This report has been signed electronically. Number of Addenda: 0

## 2022-12-20 ENCOUNTER — Ambulatory Visit: Payer: 59 | Admitting: Cardiology

## 2022-12-20 LAB — SURGICAL PATHOLOGY

## 2022-12-23 ENCOUNTER — Encounter: Payer: Self-pay | Admitting: Internal Medicine

## 2022-12-23 ENCOUNTER — Encounter (HOSPITAL_COMMUNITY): Payer: Self-pay | Admitting: Gastroenterology

## 2022-12-25 NOTE — Progress Notes (Signed)
Brandon [name],  The polyp which I removed during your recent procedure was proven to be completely benign but is considered a "pre-cancerous" polyp that MAY have grown into cancer if it had not been removed.  Studies shows that at least 20% of women over age 67 and 30% of men over age 73 have pre-cancerous polyps.  Because of the technically difficulty associated with your colonoscopy and suboptimal views of the cecum, I would recommend we repeat a colonoscopy in 5 years.   Because of your history of a difficult intubation, this will again need to be performed in the hospital setting.  If you develop any new rectal bleeding, abdominal pain or significant bowel habit changes, please contact me before then.

## 2022-12-26 ENCOUNTER — Other Ambulatory Visit (HOSPITAL_BASED_OUTPATIENT_CLINIC_OR_DEPARTMENT_OTHER): Payer: Self-pay

## 2022-12-27 ENCOUNTER — Other Ambulatory Visit (HOSPITAL_BASED_OUTPATIENT_CLINIC_OR_DEPARTMENT_OTHER): Payer: Self-pay

## 2022-12-28 DIAGNOSIS — G4733 Obstructive sleep apnea (adult) (pediatric): Secondary | ICD-10-CM | POA: Diagnosis not present

## 2022-12-30 ENCOUNTER — Other Ambulatory Visit (HOSPITAL_BASED_OUTPATIENT_CLINIC_OR_DEPARTMENT_OTHER): Payer: Self-pay

## 2023-01-06 ENCOUNTER — Ambulatory Visit: Payer: 59 | Admitting: Cardiology

## 2023-01-07 ENCOUNTER — Ambulatory Visit: Payer: 59 | Admitting: Cardiology

## 2023-01-07 DIAGNOSIS — G4734 Idiopathic sleep related nonobstructive alveolar hypoventilation: Secondary | ICD-10-CM | POA: Diagnosis not present

## 2023-01-07 NOTE — Progress Notes (Deleted)
Date:  01/07/2023   ID:  Brandon [name], DOB 07-17-1956, MRN [id number] The patient was identified using 2 identifiers. PCP:  Everrett Coombe, DO   [facility] Providers Cardiologist:  Meriam Sprague, MD Sleep Medicine:  Armanda Magic, MD     Evaluation Performed:  Follow-Up Visit Chief Complaint:  OSA  History of Present Illness:    Brandon [name] is a 67 y.o. male with  a hx of CAD, hypertension, diabetes who was referred for sleep study testing by Dr. Shari Prows.  He has a history of obstructive sleep apnea the past but stopped using his CPAP and was referred back for repeat sleep study to get back on CPAP therapy.  He underwent split-night sleep study in June 2022 which revealed severe obstructive sleep apnea with an AHI of 45.7/h and O2 saturations as low as 54% consistent with nocturnal hypoxemia.  He failed CPAP therapy due to ongoing events and underwent BiPAP titration and was placed on auto BiPAP with IPAP max 20 cm H2O, EPAP min 5 cm H2O and pressure support 4 cm H2O   He really struggled with auto BiPAP device.  He was having problems waking up about 4 hours after starting to sleep because his mask was leaking and he would take it off.  He was having significant problems with the mask really leaking and so we ordered him new PAP supplies including a new nasal cushion with chin strap as he had not changed his cushion out since he got his device.     He was referred to ENT and ultimately underwent Inspire device implant 07/30/22 by Dr. Annalee Genta.  He was seen 09/04/2022 and his inspire device was activated.  His amplitude was set at 1.2 V with a control of 1.2 to 2.2 V.  At 4 week followup he was doing well sleeping 7-8 hours but was not feeling rested and had been falling asleep during the day.  He was on level 9 (1.8V).     Past Medical History:  Diagnosis Date   Anxiety    Arthritis    Basal cell carcinoma (BCC) in situ of skin    left neck   Chronic back pain     has spinal cord stimulator and wears buprenorphine patch   Colon polyps    Coronary artery disease    S/p DES to mRCA in 12/21 // S/p DES to LCx x 2 in 12/2020 // Diff dz in small to mod Dx (not a good target for PCI); OM1 100 CTO; severe dz in small OM2 and PDA >> Med Rx   Depression    Diabetes mellitus without complication (HCC)    type 2   Fibromyalgia    Gallstones    GERD (gastroesophageal reflux disease)    Hepatitis B    HLD (hyperlipidemia)    Hypertension    Osteoarthritis    Pneumonia    PONV (postoperative nausea and vomiting)    SBO (small bowel obstruction) (HCC)    Sleep apnea    does not use CPAP   Past Surgical History:  Procedure Laterality Date   CARDIAC CATHETERIZATION  01/17/2021   CATARACT EXTRACTION W/ INTRAOCULAR LENS  IMPLANT, BILATERAL     CHOLECYSTECTOMY     CHOLECYSTECTOMY, LAPAROSCOPIC     COLONOSCOPY WITH PROPOFOL N/A 12/19/2022   Procedure: COLONOSCOPY WITH PROPOFOL;  Surgeon: Jenel Lucks, MD;  Location: [facility] ENDOSCOPY;  Service: Gastroenterology;  Laterality: N/A;   CORONARY STENT INTERVENTION  N/A 08/15/2020   Procedure: CORONARY STENT INTERVENTION;  Surgeon: Corky Crafts, MD;  Location: [facility] INVASIVE CV LAB;  Service: Cardiovascular;  Laterality: N/A;   CORONARY STENT INTERVENTION N/A 01/17/2021   Procedure: CORONARY STENT INTERVENTION;  Surgeon: Kathleene Hazel, MD;  Location: [facility] INVASIVE CV LAB;  Service: Cardiovascular;  Laterality: N/A;   CORONARY ULTRASOUND/IVUS N/A 08/15/2020   Procedure: Intravascular Ultrasound/IVUS;  Surgeon: Corky Crafts, MD;  Location: [facility] INVASIVE CV LAB;  Service: Cardiovascular;  Laterality: N/A;   DRUG INDUCED ENDOSCOPY N/A 05/23/2022   Procedure: DRUG INDUCED SLEEP ENDOSCOPY;  Surgeon: Osborn Coho, MD;  Location: [facility] OR;  Service: ENT;  Laterality: N/A;   HERNIA REPAIR     IMPLANTATION OF HYPOGLOSSAL NERVE STIMULATOR Right 07/30/2022   Procedure: IMPLANTATION OF HYPOGLOSSAL NERVE  STIMULATOR;  Surgeon: Osborn Coho, MD;  Location: [facility];  Service: ENT;  Laterality: Right;   LEFT HEART CATH AND CORONARY ANGIOGRAPHY N/A 08/15/2020   Procedure: LEFT HEART CATH AND CORONARY ANGIOGRAPHY;  Surgeon: Corky Crafts, MD;  Location: [facility] INVASIVE CV LAB;  Service: Cardiovascular;  Laterality: N/A;   LEFT HEART CATH AND CORONARY ANGIOGRAPHY N/A 01/17/2021   Procedure: LEFT HEART CATH AND CORONARY ANGIOGRAPHY;  Surgeon: Kathleene Hazel, MD;  Location: [facility] INVASIVE CV LAB;  Service: Cardiovascular;  Laterality: N/A;   LUMBAR FUSION     L3-5 with rods   MICRODISCECTOMY LUMBAR  2006   POLYPECTOMY  12/19/2022   Procedure: POLYPECTOMY;  Surgeon: Jenel Lucks, MD;  Location: [facility] ENDOSCOPY;  Service: Gastroenterology;;   SPINAL CORD STIMULATOR INSERTION     blood clot removed from spinal cord   SPINE SURGERY     blood clot removed from spinal cord   XI ROBOTIC ASSISTED INGUINAL HERNIA REPAIR WITH MESH     x 3 surgeries     No outpatient medications have been marked as taking for the 01/07/23 encounter (Appointment) with Quintella Reichert, MD.     Allergies:   Kiwi extract, Other, Penicillins, Ancef [cefazolin], Latex, Levaquin [levofloxacin], and Peach [prunus persica]   Social History   Tobacco Use   Smoking status: Former    Packs/day: 1.00    Years: 30.00    Additional pack years: 0.00    Total pack years: 30.00    Types: Cigarettes    Quit date: 2007    Years since quitting: 17.3   Smokeless tobacco: Never  Vaping Use   Vaping Use: Never used  Substance Use Topics   Alcohol use: Not Currently   Drug use: Not Currently     Family Hx: The patient's family history includes Anxiety disorder in his father and mother; CAD in his brother; COPD in his mother; Cancer in his father; Colon cancer in his mother; Coronary artery disease in his father; Depression in his father and mother; Leukemia in his maternal grandmother; Multiple  sclerosis in his daughter. There is no history of Diabetes.  ROS:   Please see the history of present illness.     All other systems reviewed and are negative.   Prior Sleep studies:   The following studies were reviewed today:  None  Labs/Other Tests and Data Reviewed:     Recent Labs: 07/13/2022: Magnesium 1.9 09/26/2022: ALT 11; BUN 11; Creat 0.75; Hemoglobin 14.3; Platelets 209; Potassium 4.0; Sodium 141   Wt Readings from Last 3 Encounters:  12/19/22 193 lb (87.5 kg)  11/12/22 199 lb 3.2 oz (90.4 kg)  10/31/22 198 lb (  89.8 kg)     Risk Assessment/Calculations:      Objective:    Vital Signs:  There were no vitals taken for this visit.  GEN: Well nourished, well developed in no acute distress HEENT: Normal NECK: No JVD; No carotid bruits LYMPHATICS: No lymphadenopathy CARDIAC:RRR, no murmurs, rubs, gallops RESPIRATORY:  Clear to auscultation without rales, wheezing or rhonchi  ABDOMEN: Soft, non-tender, non-distended MUSCULOSKELETAL:  No edema; No deformity  SKIN: Warm and dry NEUROLOGIC:  Alert and oriented x 3 PSYCHIATRIC:  Normal affect  ASSESSMENT & PLAN:    OSA   -He  really struggled with his BiPAP device and could not use it at night. -referred to ENT -s/p Inspire device 07/30/2022 by Dr. Annalee Genta -Inspire device activation performed 09/04/22 with Inspire rep with following parameters:             -Initial activation waveform looked good.             -Stimulation levels              -Final settings include : -amplitude 1.2 V with the patient control of 1.2 to 2.2 V -Pulse width 90 s -Rate 33 Hz -Start delay 30 minutes -Pause time 15 minutes -Therapy duration 10 hours -Electrode configuration  A +/-/+ -At 4 week checkin he was on level 9 (1.8V) but he was feeling sleepy during the day and he took an overnight pulse oximetry test showed an O2 sat dropping in the 70's  Hypertension -Bp well controlled on exam today -continue prescription drug  management with Toprol Xl 25mg  daily with PRn refills  Total time of encounter: 20 minutes total time of encounter, including 15 minutes spent in face-to-face patient care on the date of this encounter. This time includes coordination of care and counseling regarding above mentioned problem list. Remainder of non-face-to-face time involved reviewing chart documents/testing relevant to the patient encounter and documentation in the medical record. I have independently reviewed documentation from referring provider.    Medication Adjustments/Labs and Tests Ordered: Current medicines are reviewed at length with the patient today.  Concerns regarding medicines are outlined above.   Tests Ordered: No orders of the defined types were placed in this encounter.   Medication Changes: No orders of the defined types were placed in this encounter.   Follow Up:  In Person in 4 week(s) with Inspire Rep  Signed, Armanda Magic, MD  01/07/2023 1:29 PM    [facility]

## 2023-01-08 DIAGNOSIS — E119 Type 2 diabetes mellitus without complications: Secondary | ICD-10-CM | POA: Diagnosis not present

## 2023-01-09 ENCOUNTER — Ambulatory Visit
Payer: 59 | Attending: Student in an Organized Health Care Education/Training Program | Admitting: Student in an Organized Health Care Education/Training Program

## 2023-01-09 ENCOUNTER — Encounter: Payer: Self-pay | Admitting: Student in an Organized Health Care Education/Training Program

## 2023-01-09 ENCOUNTER — Other Ambulatory Visit (HOSPITAL_BASED_OUTPATIENT_CLINIC_OR_DEPARTMENT_OTHER): Payer: Self-pay

## 2023-01-09 VITALS — BP 103/62 | HR 78 | Temp 97.2°F | Resp 16 | Ht 68.0 in | Wt 191.0 lb

## 2023-01-09 DIAGNOSIS — M4802 Spinal stenosis, cervical region: Secondary | ICD-10-CM | POA: Diagnosis not present

## 2023-01-09 DIAGNOSIS — Z9689 Presence of other specified functional implants: Secondary | ICD-10-CM | POA: Diagnosis not present

## 2023-01-09 DIAGNOSIS — M542 Cervicalgia: Secondary | ICD-10-CM | POA: Insufficient documentation

## 2023-01-09 DIAGNOSIS — G894 Chronic pain syndrome: Secondary | ICD-10-CM | POA: Insufficient documentation

## 2023-01-09 DIAGNOSIS — M792 Neuralgia and neuritis, unspecified: Secondary | ICD-10-CM | POA: Insufficient documentation

## 2023-01-09 DIAGNOSIS — M5412 Radiculopathy, cervical region: Secondary | ICD-10-CM | POA: Insufficient documentation

## 2023-01-09 DIAGNOSIS — M961 Postlaminectomy syndrome, not elsewhere classified: Secondary | ICD-10-CM | POA: Insufficient documentation

## 2023-01-09 DIAGNOSIS — M47816 Spondylosis without myelopathy or radiculopathy, lumbar region: Secondary | ICD-10-CM | POA: Diagnosis not present

## 2023-01-09 MED ORDER — BUPRENORPHINE 15 MCG/HR TD PTWK
1.0000 | MEDICATED_PATCH | TRANSDERMAL | 2 refills | Status: AC
Start: 2023-01-10 — End: 2023-04-04
  Filled 2023-01-09: qty 4, 28d supply, fill #0
  Filled 2023-02-18: qty 4, 28d supply, fill #1

## 2023-01-09 NOTE — Progress Notes (Signed)
Nursing Pain Medication Assessment:  Safety precautions to be maintained throughout the outpatient stay will include: orient to surroundings, keep bed in low position, maintain call bell within reach at all times, provide assistance with transfer out of bed and ambulation.  Medication Inspection Compliance: Pill count conducted under aseptic conditions, in front of the patient. Neither the pills nor the bottle was removed from the patient's sight at any time. Once count was completed pills were immediately returned to the patient in their original bottle.  Medication: Buprenorphine (Suboxone) Pill/Patch Count:  0 of 4 pills remain Pill/Patch Appearance: Markings consistent with prescribed medication Bottle Appearance: Standard pharmacy container. Clearly labeled. Filled Date: 3 / 21 / 2024 Last Medication intake:  Today

## 2023-01-09 NOTE — Progress Notes (Signed)
PROVIDER NOTE: Information contained herein reflects review and annotations entered in association with encounter. Interpretation of such information and data should be left to medically-trained personnel. Information provided to patient can be located elsewhere in the medical record under "Patient Instructions". Document created using STT-dictation technology, any transcriptional errors that may result from process are unintentional.    Patient: Brandon [name]  Service Category: E/M  Provider: Edward Jolly, MD  DOB: [birth year]  DOS: 01/09/2023  Referring Provider: Everrett Coombe, DO  MRN: [id number]  Specialty: Interventional Pain Management  PCP: Everrett Coombe, DO  Type: Established Patient  Setting: Ambulatory outpatient    Location: Office  Delivery: Face-to-face     HPI  [name], a 67 y.o. year old male, is here today because of his Chronic pain syndrome [G89.4]. Mr. Soderberg primary complain today is Back Pain (lower) Last encounter: My last encounter with him was on 10/15/22 Pertinent problems: Mr. Papendick has Type 2 diabetes mellitus with other specified complication (HCC); Lumbar facet arthropathy; Coronary artery disease; Acute pain of left shoulder; Sacroiliac joint disease; Chronic pain syndrome; History of lumbar fusion (L4/5); Failed back surgical syndrome; and Pain management contract signed on their pertinent problem list. Pain Assessment: Severity of Chronic pain is reported as a 2 /10. Location: Back Lower/denies. Onset: More than a month ago. Quality: Aching, Constant (intense). Timing: Constant. Modifying factor(s): patches, rest. Vitals:  height is 5\' 8"  (1.727 m) and weight is 191 lb (86.6 kg). His temperature is 97.2 F (36.2 C) (abnormal). His blood pressure is 103/62 and his pulse is 78. His respiration is 16 and oxygen saturation is 98%.   Reason for encounter: medication management.   Continues to have right lumbar spine pain, history of lumbar facet arthropathy  and lumbar spine fusion Continues with hypoglossal stimulator with ENT for obstructive sleep apnea management. He states it is helping and he is sleeping 7-8 hrs at night with uninterrupted sleep. Presents today for medication refill of Butrans.  He states that it is helpful in managing his pain.   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       Pharmacotherapy: No side-effects or adverse reactions reported. Compliance: No problems identified. Effectiveness: Clinically acceptable.  Newman Pies, RN  01/09/2023 11:00 AM  Sign when Signing Visit Nursing Pain Medication Assessment:  Safety precautions to be maintained throughout the outpatient stay will include: orient to surroundings, keep bed in low position, maintain call bell within reach at all times, provide assistance with transfer out of bed and ambulation.  Medication Inspection Compliance: Pill count conducted under aseptic conditions, in front of the patient. Neither the pills nor the bottle was removed from the patient's sight at any time. Once count was completed pills were immediately returned to the patient in their original bottle.  Medication: Buprenorphine (Suboxone) Pill/Patch Count:  0 of 4 pills remain Pill/Patch Appearance: Markings consistent with prescribed medication Bottle Appearance: Standard pharmacy container. Clearly labeled. Filled Date: 3 / 21 / 2024 Last Medication intake:  Today    No results found for: "CBDTHCR" No results found for: "D8THCCBX" No results found for: "D9THCCBX"  UDS:  Summary  Date Value Ref Range Status  10/02/2021 Note  Final    Comment:    ==================================================================== Compliance Drug Analysis, Ur ==================================================================== Test  Result       Flag       Units  Drug Present and Declared for Prescription Verification   Buprenorphine                  8            EXPECTED   ng/mg creat   Norbuprenorphine               6            EXPECTED   ng/mg creat    Source of buprenorphine is a scheduled prescription medication.    Norbuprenorphine is an expected metabolite of buprenorphine.    Tizanidine                     PRESENT      EXPECTED   Bupropion                      PRESENT      EXPECTED   Hydroxybupropion               PRESENT      EXPECTED    Hydroxybupropion is an expected metabolite of bupropion.    Aripiprazole                   PRESENT      EXPECTED   Metoprolol                     PRESENT      EXPECTED  Drug Present not Declared for Prescription Verification   Acetaminophen                  PRESENT      UNEXPECTED  Drug Absent but Declared for Prescription Verification   Alpha-hydroxytriazolam         Not Detected UNEXPECTED ng/mg creat   Tramadol                       Not Detected UNEXPECTED ng/mg creat   Trazodone                      Not Detected UNEXPECTED   Salicylate                     Not Detected UNEXPECTED    Aspirin, as indicated in the declared medication list, is not always    detected even when used as directed.    Diphenhydramine                Not Detected UNEXPECTED   Lidocaine                      Not Detected UNEXPECTED    Lidocaine, as indicated in the declared medication list, is not    always detected even when used as directed.  ==================================================================== Test                      Result    Flag   Units      Ref Range   Creatinine              99               mg/dL      >=16 ==================================================================== Declared Medications:  The flagging and interpretation on this report are based on the  following declared medications.  Unexpected results may arise from  inaccuracies in the declared  medications.   **Note: The testing scope of this panel includes these medications:   Aripiprazole (Abilify)  Bupropion (Wellbutrin)  Diphenhydramine  Metoprolol (Toprol)  Tramadol (Ultram)  Trazodone (Desyrel)  Triazolam (Halcion)   **Note: The testing scope of this panel does not include small to  moderate amounts of these reported medications:   Aspirin  Buprenorphine Patch (BuTrans)  Lidocaine  Tizanidine (Zanaflex)  Topical Lidocaine (Lidoderm)   **Note: The testing scope of this panel does not include the  following reported medications:   Amlodipine (Norvasc)  Atorvastatin (Lipitor)  Clopidogrel (Plavix)  Empagliflozin (Jardiance)  Insulin (Humalog)  Isosorbide (Imdur)  Losartan (Cozaar)  Metoclopramide (Reglan)  Nitroglycerin (Nitrostat)  Nystatin (Mycostatin)  Ondansetron (Zofran)  Pantoprazole (Protonix)  Prednisolone  Semaglutide (Ozempic)  Tamsulosin (Flomax)  Testosterone ==================================================================== For clinical consultation, please call [phone]. ====================================================================       ROS  Constitutional: Denies any fever or chills Gastrointestinal: No reported hemesis, hematochezia, vomiting, or acute GI distress Musculoskeletal: Denies any acute onset joint swelling, redness, loss of ROM, or weakness Neurological: No reported episodes of acute onset apraxia, aphasia, dysarthria, agnosia, amnesia, paralysis, loss of coordination, or loss of consciousness  Medication Review  AMBULATORY NON FORMULARY MEDICATION, ARIPiprazole, Biotin, Dexcom G6 Sensor, Glucagon, Multi-Vitamin, Na Sulfate-K Sulfate-Mg Sulf, acetaminophen, albuterol, atorvastatin, b complex vitamins, buPROPion, buprenorphine, clopidogrel, docusate sodium, empagliflozin, ferrous sulfate, insulin lispro, isosorbide mononitrate, magnesium oxide, metoCLOPramide, metoprolol succinate, nitroGLYCERIN,  ondansetron, pantoprazole, polyethylene glycol powder, tamsulosin, testosterone, tiZANidine, and tirzepatide  History Review  Allergy: Mr. Rathsack is allergic to kiwi extract, other, penicillins, ancef [cefazolin], latex, levaquin [levofloxacin], and peach [prunus persica]. Drug: Mr. Sirak  reports that he does not currently use drugs. Alcohol:  reports that he does not currently use alcohol. Tobacco:  reports that he quit smoking about 17 years ago. His smoking use included cigarettes. He has a 30.00 pack-year smoking history. He has never used smokeless tobacco. Social: Mr. Sohmer  reports that he quit smoking about 17 years ago. His smoking use included cigarettes. He has a 30.00 pack-year smoking history. He has never used smokeless tobacco. He reports that he does not currently use alcohol. He reports that he does not currently use drugs. Medical:  has a past medical history of Anxiety, Arthritis, Basal cell carcinoma (BCC) in situ of skin, Chronic back pain, Colon polyps, Coronary artery disease, Depression, Diabetes mellitus without complication (HCC), Fibromyalgia, Gallstones, GERD (gastroesophageal reflux disease), Hepatitis B, HLD (hyperlipidemia), Hypertension, Osteoarthritis, Pneumonia, PONV (postoperative nausea and vomiting), SBO (small bowel obstruction) (HCC), and Sleep apnea. Surgical: Mr. Schwent  has a past surgical history that includes Spinal cord stimulator insertion; Cholecystectomy, laparoscopic; Cataract extraction w/ intraocular lens  implant, bilateral; Lumbar fusion; XI Robotic assisted inguinal hernia repair with mesh; LEFT HEART CATH AND CORONARY ANGIOGRAPHY (N/A, 08/15/2020); Coronary Ultrasound/IVUS (N/A, 08/15/2020); CORONARY STENT INTERVENTION (N/A, 08/15/2020); Cardiac catheterization (01/17/2021); LEFT HEART CATH AND CORONARY ANGIOGRAPHY (N/A, 01/17/2021); CORONARY STENT INTERVENTION (N/A, 01/17/2021); Microdiscectomy lumbar (2006); Spine surgery; Hernia repair; Drug  induced endoscopy (N/A, 05/23/2022); Cholecystectomy; Implantation of hypoglossal nerve stimulator (Right, 07/30/2022); Colonoscopy with propofol (N/A, 12/19/2022); and polypectomy (12/19/2022). Family: family history includes Anxiety disorder in his father and mother; CAD in his brother; COPD in his mother; Cancer in his father; Colon cancer in his mother; Coronary artery disease in his father; Depression in his father and mother; Leukemia in his maternal grandmother; Multiple sclerosis in his daughter.  Laboratory  Chemistry Profile   Renal Lab Results  Component Value Date   BUN 11 09/26/2022   CREATININE 0.75 09/26/2022   BCR SEE NOTE: 09/26/2022   GFRAA >60 04/17/2020   GFRNONAA >60 07/13/2022    Hepatic Lab Results  Component Value Date   AST 16 09/26/2022   ALT 11 09/26/2022   ALBUMIN 3.6 07/13/2022   ALKPHOS 56 07/13/2022   LIPASE 37 07/12/2022    Electrolytes Lab Results  Component Value Date   NA 141 09/26/2022   K 4.0 09/26/2022   CL 103 09/26/2022   CALCIUM 9.5 09/26/2022   MG 1.9 07/13/2022   PHOS 3.2 07/13/2022    Bone Lab Results  Component Value Date   TESTOSTERONE 427 09/26/2022    Inflammation (CRP: Acute Phase) (ESR: Chronic Phase) Lab Results  Component Value Date   CRP 1.5 (H) 08/13/2020   ESRSEDRATE 17 (H) 08/13/2020   LATICACIDVEN 1.0 07/12/2022         Note: Above Lab results reviewed.  Recent Imaging Review  DG Neck Soft Tissue CLINICAL DATA:  Status post hypoglossal nerve stimulator placement.  EXAM: NECK SOFT TISSUES - 1+ VIEW  COMPARISON:  None Available.  FINDINGS: There is no evidence of retropharyngeal soft tissue swelling or epiglottic enlargement. The cervical airway is unremarkable. Stimulator lead is seen in grossly good position in the submandibular region.  IMPRESSION: Stimulator leads seen in grossly good position.  Electronically Signed   By: Lupita Raider M.D.   On: 07/30/2022 16:12 X-ray chest PA or  AP CLINICAL DATA:  Postoperative check status post hypoglossal nerve stimulator.  EXAM: CHEST  1 VIEW  COMPARISON:  Chest x-ray 12/29/2021  FINDINGS: Right-sided generator overlies the right chest with wire extending into the right neck, new from prior. Thoracic spinal cord stimulator device is unchanged in position. There is linear atelectasis in the lung bases. The lungs are otherwise clear. There is no pleural effusion or pneumothorax. The cardiomediastinal silhouette is within normal limits. No acute fractures are seen.  IMPRESSION: New right-sided hypoglossal nerve stimulator with wire extending into the right neck.  No acute cardiopulmonary process.  Electronically Signed   By: Darliss Cheney M.D.   On: 07/30/2022 16:11 Note: Reviewed        Physical Exam  General appearance: Well nourished, well developed, and well hydrated. In no apparent acute distress Mental status: Alert, oriented x 3 (person, place, & time)       Respiratory: No evidence of acute respiratory distress Eyes: PERLA Vitals: BP 103/62   Pulse 78   Temp (!) 97.2 F (36.2 C)   Resp 16   Ht 5\' 8"  (1.727 m)   Wt 191 lb (86.6 kg)   SpO2 98%   BMI 29.04 kg/m  BMI: Estimated body mass index is 29.04 kg/m as calculated from the following:   Height as of this encounter: 5\' 8"  (1.727 m).   Weight as of this encounter: 191 lb (86.6 kg). Ideal: Ideal body weight: 68.4 kg (150 lb 12.7 oz) Adjusted ideal body weight: 75.7 kg (166 lb 14 oz)  Cervical Spine Area Exam  Skin & Axial Inspection: No masses, redness, edema, swelling, or associated skin lesions Alignment: Symmetrical Functional ROM: Pain restricted ROM, to the right Stability: No instability detected Muscle Tone/Strength: Functionally intact. No obvious neuro-muscular anomalies detected. Sensory (Neurological): Dermatomal pain pattern Palpation: No palpable anomalies               Upper Extremity (UE) Exam  Side: Right upper extremity    Side: Left upper extremity  Skin & Extremity Inspection: Skin color, temperature, and hair growth are WNL. No peripheral edema or cyanosis. No masses, redness, swelling, asymmetry, or associated skin lesions. No contractures.   Skin & Extremity Inspection: Skin color, temperature, and hair growth are WNL. No peripheral edema or cyanosis. No masses, redness, swelling, asymmetry, or associated skin lesions. No contractures.  Functional ROM: Pain restricted ROM           Functional ROM: Unrestricted ROM          Muscle Tone/Strength: Functionally intact. No obvious neuro-muscular anomalies detected.   Muscle Tone/Strength: Functionally intact. No obvious neuro-muscular anomalies detected.  Sensory (Neurological): Dermatomal pain pattern           Sensory (Neurological): Unimpaired          Palpation: No palpable anomalies               Palpation: No palpable anomalies              Provocative Test(s):  Phalen's test: deferred Tinel's test: deferred Apley's scratch test (touch opposite shoulder):  Action 1 (Across chest): Decreased ROM Action 2 (Overhead): Decreased ROM Action 3 (LB reach): Decreased ROM     Provocative Test(s):  Phalen's test: deferred Tinel's test: deferred Apley's scratch test (touch opposite shoulder):  Action 1 (Across chest): deferred Action 2 (Overhead): deferred Action 3 (LB reach): deferred          Thoracic Spine Area Exam  Skin & Axial Inspection: Well healed scar from previous spine surgery detected Alignment: Symmetrical Functional ROM: Pain restricted ROM Stability: No instability detected Muscle Tone/Strength: Functionally intact. No obvious neuro-muscular anomalies detected. Sensory (Neurological): Neurogenic pain pattern Muscle strength & Tone: No palpable anomalies   Lumbar Spine Area Exam  Skin & Axial Inspection: Well healed scar from previous spine surgery detected Alignment: Symmetrical Functional ROM: Pain restricted ROM affecting both  sides Stability: No instability detected Muscle Tone/Strength: Functionally intact. No obvious neuro-muscular anomalies detected. Sensory (Neurological): Dermatomal pain pattern + Lumbar facet extension positive for pain consistent with facet syndrome Palpation: IPG present     Lower Extremity Exam      Side: Right lower extremity   Side: Left lower extremity  Stability: No instability observed           Stability: No instability observed          Skin & Extremity Inspection: Skin color, temperature, and hair growth are WNL. No peripheral edema or cyanosis. No masses, redness, swelling, asymmetry, or associated skin lesions. No contractures.   Skin & Extremity Inspection: Skin color, temperature, and hair growth are WNL. No peripheral edema or cyanosis. No masses, redness, swelling, asymmetry, or associated skin lesions. No contractures.  Functional ROM: Pain restricted ROM for hip and knee joints           Functional ROM: Pain restricted ROM for hip and knee joints          Muscle Tone/Strength: Functionally intact. No obvious neuro-muscular anomalies detected.   Muscle Tone/Strength: Functionally intact. No obvious neuro-muscular anomalies detected.  Sensory (Neurological): Arthropathic arthralgia         Sensory (Neurological): Arthropathic arthralgia        DTR: Patellar: deferred today Achilles: deferred today Plantar: deferred today   DTR: Patellar: deferred today Achilles: deferred today Plantar: deferred today  Palpation: No palpable anomalies   Palpation: No palpable anomalies  Assessment   Diagnosis Status  1. Chronic pain syndrome   2. Post laminectomy syndrome   3. Neuroforaminal stenosis of cervical spine   4. Cervical radicular pain (right C5/6)   5. Radicular pain in right arm   6. Spinal cord stimulator status (Nevro)   7. Cervicalgia   8. Lumbar facet arthropathy   9. Lumbar spondylosis      Controlled Controlled Controlled    Plan of Care   Problem-specific:  No problem-specific Assessment & Plan notes found for this encounter.  Mr. ALISHAN BRUNSVOLD has a current medication list which includes the following long-term medication(s): aripiprazole, atorvastatin, bupropion, bupropion, clopidogrel, empagliflozin, insulin lispro, iron (ferrous sulfate), isosorbide mononitrate, metoclopramide, metoprolol succinate, pantoprazole, tizanidine, and testosterone.  Pharmacotherapy (Medications Ordered): Meds ordered this encounter  Medications   buprenorphine (BUTRANS) 15 MCG/HR    Sig: Place 1 patch onto the skin every Friday.    Dispense:  4 patch    Refill:  2    Chronic Pain: STOP Act (Not applicable) Fill 1 day early if closed on refill date. Avoid benzodiazepines within 8 hours of opioids   Continue with Nevro SCS, APAP and Tizanidine PRN  Orders:  No orders of the defined types were placed in this encounter.  Follow-up plan:   Return in about 3 months (around 04/15/2023) for Medication Management, in person.    Recent Visits Date Type Provider Dept  10/15/22 Office Visit Edward Jolly, MD [facility]  Showing recent visits within past 90 days and meeting all other requirements Today's Visits Date Type Provider Dept  01/09/23 Office Visit Edward Jolly, MD [facility]  Showing today's visits and meeting all other requirements Future Appointments No visits were found meeting these conditions. Showing future appointments within next 90 days and meeting all other requirements  I discussed the assessment and treatment plan with the patient. The patient was provided an opportunity to ask questions and all were answered. The patient agreed with the plan and demonstrated an understanding of the instructions.  Patient advised to call back or seek an in-person evaluation if the symptoms or condition worsens.  Duration of encounter: .  Total time on encounter, as per AMA guidelines included both the  face-to-face and non-face-to-face time personally spent by the physician and/or other qualified health care professional(s) on the day of the encounter (includes time in activities that require the physician or other qualified health care professional and does not include time in activities normally performed by clinical staff). Physician's time may include the following activities when performed: preparing to see the patient (eg, review of tests, pre-charting review of records) obtaining and/or reviewing separately obtained history performing a medically appropriate examination and/or evaluation counseling and educating the patient/family/caregiver ordering medications, tests, or procedures referring and communicating with other health care professionals (when not separately reported) documenting clinical information in the electronic or other health record independently interpreting results (not separately reported) and communicating results to the patient/ family/caregiver care coordination (not separately reported)  Note by: Edward Jolly, MD Date: 01/09/2023; Time: 11:08 AM

## 2023-01-10 NOTE — Telephone Encounter (Signed)
Close note

## 2023-01-13 ENCOUNTER — Other Ambulatory Visit: Payer: Self-pay

## 2023-01-13 ENCOUNTER — Other Ambulatory Visit (HOSPITAL_BASED_OUTPATIENT_CLINIC_OR_DEPARTMENT_OTHER): Payer: Self-pay

## 2023-01-14 ENCOUNTER — Other Ambulatory Visit (HOSPITAL_BASED_OUTPATIENT_CLINIC_OR_DEPARTMENT_OTHER): Payer: Self-pay

## 2023-01-15 ENCOUNTER — Other Ambulatory Visit (HOSPITAL_BASED_OUTPATIENT_CLINIC_OR_DEPARTMENT_OTHER): Payer: Self-pay

## 2023-01-15 ENCOUNTER — Telehealth: Payer: Self-pay

## 2023-01-15 DIAGNOSIS — G4733 Obstructive sleep apnea (adult) (pediatric): Secondary | ICD-10-CM

## 2023-01-15 NOTE — Telephone Encounter (Signed)
Order placed for In-lab inspire titration per Dr. Mayford Knife, called to let patient know that Dr. Mayford Knife would like this done in July and orders will reflect this. Patient verbalizes understanding.

## 2023-01-16 ENCOUNTER — Other Ambulatory Visit (HOSPITAL_BASED_OUTPATIENT_CLINIC_OR_DEPARTMENT_OTHER): Payer: Self-pay

## 2023-01-23 ENCOUNTER — Other Ambulatory Visit (HOSPITAL_BASED_OUTPATIENT_CLINIC_OR_DEPARTMENT_OTHER): Payer: Self-pay

## 2023-01-28 DIAGNOSIS — G4733 Obstructive sleep apnea (adult) (pediatric): Secondary | ICD-10-CM | POA: Diagnosis not present

## 2023-02-01 ENCOUNTER — Other Ambulatory Visit (HOSPITAL_BASED_OUTPATIENT_CLINIC_OR_DEPARTMENT_OTHER): Payer: Self-pay

## 2023-02-03 ENCOUNTER — Other Ambulatory Visit (HOSPITAL_BASED_OUTPATIENT_CLINIC_OR_DEPARTMENT_OTHER): Payer: Self-pay

## 2023-02-03 ENCOUNTER — Ambulatory Visit: Payer: 59 | Admitting: Family Medicine

## 2023-02-04 ENCOUNTER — Other Ambulatory Visit (HOSPITAL_COMMUNITY): Payer: Self-pay

## 2023-02-04 ENCOUNTER — Other Ambulatory Visit: Payer: Self-pay

## 2023-02-06 ENCOUNTER — Other Ambulatory Visit: Payer: Self-pay

## 2023-02-07 ENCOUNTER — Encounter: Payer: Self-pay | Admitting: Cardiology

## 2023-02-07 ENCOUNTER — Ambulatory Visit: Payer: 59 | Attending: Cardiology | Admitting: Cardiology

## 2023-02-07 VITALS — BP 124/78 | HR 77 | Ht 68.0 in | Wt 198.4 lb

## 2023-02-07 DIAGNOSIS — I10 Essential (primary) hypertension: Secondary | ICD-10-CM | POA: Diagnosis not present

## 2023-02-07 DIAGNOSIS — G4733 Obstructive sleep apnea (adult) (pediatric): Secondary | ICD-10-CM | POA: Diagnosis not present

## 2023-02-07 DIAGNOSIS — G4734 Idiopathic sleep related nonobstructive alveolar hypoventilation: Secondary | ICD-10-CM | POA: Diagnosis not present

## 2023-02-07 NOTE — Telephone Encounter (Signed)
Prior Authorization for [place] [facility] sent to [facility] via Phone. Reference # .  NO PA REQ-REF# [id number].

## 2023-02-07 NOTE — Progress Notes (Signed)
Date:  02/07/2023   ID:  Brandon [name], DOB [birth year], MRN [id number] The patient was identified using 2 identifiers. PCP:  Everrett Coombe, DO   [facility] Providers Cardiologist:  Meriam Sprague, MD Sleep Medicine:  Armanda Magic, MD     Evaluation Performed:  Follow-Up Visit Chief Complaint:  OSA  History of Present Illness:    Brandon [name] is a 67 y.o. male with  a hx of CAD, hypertension, diabetes who was referred for sleep study testing by Dr. Shari Prows.  He has a history of obstructive sleep apnea the past but stopped using his CPAP and was referred back for repeat sleep study to get back on CPAP therapy.  He underwent split-night sleep study in June 2022 which revealed severe obstructive sleep apnea with an AHI of 45.7/h and O2 saturations as low as 54% consistent with nocturnal hypoxemia.  He failed CPAP therapy due to ongoing events and underwent BiPAP titration and was placed on auto BiPAP with IPAP max 20 cm H2O, EPAP min 5 cm H2O and pressure support 4 cm H2O   He really struggled with auto BiPAP device.  He was having problems waking up about 4 hours after starting to sleep because his mask was leaking and he would take it off.  He was having significant problems with the mask really leaking and so we ordered him new PAP supplies including a new nasal cushion with chin strap as he had not changed his cushion out since he got his device.     He was referred to ENT and ultimately underwent Inspire device implant 07/30/22 by Dr. Annalee Genta.  He was seen 09/04/2022 and his inspire device was activated.  His amplitude was set at 1.2 V with a control of 1.2 to 2.2 V.  He is now back for 4-week virtual check-in.  At last office visit he was sleeping 7-8 hours nightly but was having problems not feeling rested when he got up in the morning and was falling asleep during the day. He was seen in the office by Jari Favre, PA and the inspire rep and device interrogation was  performed with adjustments made.  He is now back for follow-up to see how he is doing.  He tells me that he does not feel rested when he gets up in the am and then gets sleepy during the day as well.  He has gone back to working full time and feels like it makes him more tired.  He is on O2 at 3L in addition to his Inspire. He sleeps around 6-8 hours nightly.  His wife says that he does not snore.  He has a little bit of tongue soreness at the base of his tongue when he wakes up but goes away by lunch.    Past Medical History:  Diagnosis Date   Anxiety    Arthritis    Basal cell carcinoma (BCC) in situ of skin    left neck   Chronic back pain    has spinal cord stimulator and wears buprenorphine patch   Colon polyps    Coronary artery disease    S/p DES to mRCA in 12/21 // S/p DES to LCx x 2 in 12/2020 // Diff dz in small to mod Dx (not a good target for PCI); OM1 100 CTO; severe dz in small OM2 and PDA >> Med Rx   Depression    Diabetes mellitus without complication (HCC)    type  2   Fibromyalgia    Gallstones    GERD (gastroesophageal reflux disease)    Hepatitis B    HLD (hyperlipidemia)    Hypertension    Osteoarthritis    Pneumonia    PONV (postoperative nausea and vomiting)    SBO (small bowel obstruction) (HCC)    Sleep apnea    does not use CPAP   Past Surgical History:  Procedure Laterality Date   CARDIAC CATHETERIZATION  01/17/2021   CATARACT EXTRACTION W/ INTRAOCULAR LENS  IMPLANT, BILATERAL     CHOLECYSTECTOMY     CHOLECYSTECTOMY, LAPAROSCOPIC     COLONOSCOPY WITH PROPOFOL N/A 12/19/2022   Procedure: COLONOSCOPY WITH PROPOFOL;  Surgeon: Jenel Lucks, MD;  Location: [facility] ENDOSCOPY;  Service: Gastroenterology;  Laterality: N/A;   CORONARY STENT INTERVENTION N/A 08/15/2020   Procedure: CORONARY STENT INTERVENTION;  Surgeon: Corky Crafts, MD;  Location: [facility] INVASIVE CV LAB;  Service: Cardiovascular;  Laterality: N/A;   CORONARY STENT INTERVENTION N/A  01/17/2021   Procedure: CORONARY STENT INTERVENTION;  Surgeon: Kathleene Hazel, MD;  Location: [facility] INVASIVE CV LAB;  Service: Cardiovascular;  Laterality: N/A;   CORONARY ULTRASOUND/IVUS N/A 08/15/2020   Procedure: Intravascular Ultrasound/IVUS;  Surgeon: Corky Crafts, MD;  Location: [facility] INVASIVE CV LAB;  Service: Cardiovascular;  Laterality: N/A;   DRUG INDUCED ENDOSCOPY N/A 05/23/2022   Procedure: DRUG INDUCED SLEEP ENDOSCOPY;  Surgeon: Osborn Coho, MD;  Location: [facility] OR;  Service: ENT;  Laterality: N/A;   HERNIA REPAIR     IMPLANTATION OF HYPOGLOSSAL NERVE STIMULATOR Right 07/30/2022   Procedure: IMPLANTATION OF HYPOGLOSSAL NERVE STIMULATOR;  Surgeon: Osborn Coho, MD;  Location: [facility];  Service: ENT;  Laterality: Right;   LEFT HEART CATH AND CORONARY ANGIOGRAPHY N/A 08/15/2020   Procedure: LEFT HEART CATH AND CORONARY ANGIOGRAPHY;  Surgeon: Corky Crafts, MD;  Location[facility] INVASIVE CV LAB;  Service: Cardiovascular;  Laterality: N/A;   LEFT HEART CATH AND CORONARY ANGIOGRAPHY N/A 01/17/2021   Procedure: LEFT HEART CATH AND CORONARY ANGIOGRAPHY;  Surgeon: Kathleene Hazel, MD;  Location: [facility] INVASIVE CV LAB;  Service: Cardiovascular;  Laterality: N/A;   LUMBAR FUSION     L3-5 with rods   MICRODISCECTOMY LUMBAR  2006   POLYPECTOMY  12/19/2022   Procedure: POLYPECTOMY;  Surgeon: Jenel Lucks, MD;  Location: [facility] ENDOSCOPY;  Service: Gastroenterology;;   SPINAL CORD STIMULATOR INSERTION     blood clot removed from spinal cord   SPINE SURGERY     blood clot removed from spinal cord   XI ROBOTIC ASSISTED INGUINAL HERNIA REPAIR WITH MESH     x 3 surgeries     Current Meds  Medication Sig   acetaminophen (TYLENOL) 500 MG tablet Take 1,000 mg by mouth See admin instructions. Take 2 tablets (1000 mg) by mouth scheduled in the morning & may take 2 tablets (1000 mg) by mouth every 6 hours if needed for pain.   albuterol (VENTOLIN HFA) 108  (90 Base) MCG/ACT inhaler Inhale 2 puffs into the lungs as needed for wheezing or shortness of breath (When sick).   AMBULATORY NON FORMULARY MEDICATION Please provide home oxygen for nocturnal use.  Apply 2-4L of O2 via nasal cannula at bedtime.  Dx: Nocturnal Oxygen Desaturation G47.34   AMBULATORY NON FORMULARY MEDICATION Please provide humidifier for home oxygen due to nasal irritation with use.   ARIPiprazole (ABILIFY) 10 MG tablet Take 1 tablet (10 mg total) by mouth every morning.   atorvastatin (LIPITOR) 80  MG tablet Take 1 tablet (80 mg total) by mouth daily.   b complex vitamins capsule Take 1 capsule by mouth in the morning.   Biotin (BIOTIN MAXIMUM STRENGTH) 10 MG TABS Take 1 tablet by mouth daily.   buprenorphine (BUTRANS) 15 MCG/HR Place 1 patch onto the skin every Friday.   buPROPion (WELLBUTRIN XL) 150 MG 24 hr tablet Take 1 tablet (150 mg total) by mouth daily in addition to 300mg  tablet.   buPROPion (WELLBUTRIN XL) 300 MG 24 hr tablet Take 1 tablet (300 mg total) by mouth daily.   clopidogrel (PLAVIX) 75 MG tablet Take 1 tablet (75 mg total) by mouth daily with breakfast.   Continuous Blood Gluc Sensor (DEXCOM G6 SENSOR) MISC Use as directed. Change sensor every 10 days   docusate sodium (COLACE) 100 MG capsule Take 2 capsules (200 mg total) by mouth in the morning.   empagliflozin (JARDIANCE) 25 MG TABS tablet Take 1 tablet (25 mg total) by mouth daily.   GVOKE HYPOPEN 1-PACK 1 MG/0.2ML SOAJ Use as needed for hypoglycemia   insulin lispro (HUMALOG) 100 UNIT/ML injection For use in pump, total of 60 units per day   Iron, Ferrous Sulfate, 325 (65 Fe) MG TABS Take 1 tablet (325 mg) by mouth daily.   isosorbide mononitrate (IMDUR) 30 MG 24 hr tablet Take 1 tablet (30 mg total) by mouth daily.   magnesium oxide (MAG-OX) 400 (240 Mg) MG tablet Take 800 mg by mouth in the morning.   metoCLOPramide (REGLAN) 10 MG tablet Take 1 tablet (10 mg total) by mouth daily as needed for nausea or  vomiting (upset stomach).   metoprolol succinate (TOPROL XL) 25 MG 24 hr tablet Take 1 tablet (25 mg total) by mouth daily.   Multiple Vitamin (MULTI-VITAMIN) tablet Take 1 tablet by mouth daily.   Na Sulfate-K Sulfate-Mg Sulf 17.5-3.13-1.6 GM/177ML SOLN Take by mouth as directed   nitroGLYCERIN (NITROSTAT) 0.4 MG SL tablet Place 0.4 mg under the tongue every 5 (five) minutes x 3 doses as needed for chest pain.   ondansetron (ZOFRAN-ODT) 4 MG disintegrating tablet Take 1 tablet (4 mg total) by mouth every 8 (eight) hours as needed for nausea or vomiting.   pantoprazole (PROTONIX) 40 MG tablet Take 1 tablet (40 mg total) by mouth daily.   polyethylene glycol powder (GLYCOLAX/MIRALAX) 17 GM/SCOOP powder Take 17 g by mouth daily as needed for mild constipation.   tamsulosin (FLOMAX) 0.4 MG CAPS capsule Take 1 capsule (0.4 mg total) by mouth daily.   testosterone (ANDROGEL) 50 MG/5GM (1%) GEL Place 5 g (1 packet) onto the shoulders and upper arms daily as directed.   tirzepatide (MOUNJARO) 12.5 MG/0.5ML Pen Inject 12.5 mg into the skin once a week.   tiZANidine (ZANAFLEX) 4 MG tablet Take 1-1&1/2 tablets (4-6 mg total) by mouth every 8 (eight) hours as needed for muscle spasms.     Allergies:   Kiwi extract, Other, Penicillins, Ancef [cefazolin], Latex, Levaquin [levofloxacin], and Peach [prunus persica]   Social History   Tobacco Use   Smoking status: Former    Packs/day: 1.00    Years: 30.00    Additional pack years: 0.00    Total pack years: 30.00    Types: Cigarettes    Quit date: 2007    Years since quitting: 17.4   Smokeless tobacco: Never  Vaping Use   Vaping Use: Never used  Substance Use Topics   Alcohol use: Not Currently   Drug use: Not Currently  Family Hx: The patient's family history includes Anxiety disorder in his father and mother; CAD in his brother; COPD in his mother; Cancer in his father; Colon cancer in his mother; Coronary artery disease in his father;  Depression in his father and mother; Leukemia in his maternal grandmother; Multiple sclerosis in his daughter. There is no history of Diabetes.  ROS:   Please see the history of present illness.     All other systems reviewed and are negative.   Prior Sleep studies:   The following studies were reviewed today:  None  Labs/Other Tests and Data Reviewed:     Recent Labs: 07/13/2022: Magnesium 1.9 09/26/2022: ALT 11; BUN 11; Creat 0.75; Hemoglobin 14.3; Platelets 209; Potassium 4.0; Sodium 141   Wt Readings from Last 3 Encounters:  02/07/23 198 lb 6.4 oz (90 kg)  01/09/23 191 lb (86.6 kg)  12/19/22 193 lb (87.5 kg)     Risk Assessment/Calculations:      Objective:    Vital Signs:  BP 124/78   Pulse 77   Ht 5\' 8"  (1.727 m)   Wt 198 lb 6.4 oz (90 kg)   SpO2 99%   BMI 30.17 kg/m   GEN: Well nourished, well developed in no acute distress HEENT: Normal NECK: No JVD; No carotid bruits LYMPHATICS: No lymphadenopathy CARDIAC:RRR, no murmurs, rubs, gallops RESPIRATORY:  Clear to auscultation without rales, wheezing or rhonchi  ABDOMEN: Soft, non-tender, non-distended MUSCULOSKELETAL:  No edema; No deformity  SKIN: Warm and dry NEUROLOGIC:  Alert and oriented x 3 PSYCHIATRIC:  Normal affect  ASSESSMENT & PLAN:    OSA   -He  really struggled with his BiPAP device and could not use it at night. -referred to ENT -s/p Inspire device 07/30/2022 by Dr. Annalee Genta -Inspire device activation performed 09/04/22 with Inspire rep with following parameters:             -Initial activation waveform looked good.             -Stimulation levels              -Final settings include : -amplitude 1.2 V with the patient control of 1.2 to 2.2 V -Pulse width 90 s -Rate 33 Hz -Start delay 30 minutes -Pause time 15 minutes -Therapy duration 10 hours -Electrode configuration  A +/-/+ -At last office visit he was using it 8 to 9 hours a night and was on level 9 which was 1.8 V.  He was  complaining of feeling sleepy during the day and took an overnight pulse oximetry which showed O2 sats dropping into the 70s on his inspire device  -He was seen in the office by Jari Favre,  PA and the inspire rep and device interrogation was performed with adjustments made.   -device interrogated today and he is using on average 6-7 hours nightly.  Tongue motion looked good at 2.5V. Currently on 2.5V which is level 7.  He was told he could go down a level if he gets tongue sensitivity -difficult to say whether his fatigue is related to starting a full time job or residual OSA.  He is on 3L O2 at night and no longer has snoring or waking up gasping for breath. -will scheduled for in lab Inspire titration  Hypertension -BP is controlled on exam today -Continue prescription drug management with Toprol-XL 25 mg daily, Imdur 30 mg daily with as needed refills    Medication Adjustments/Labs and Tests Ordered: Current medicines are reviewed at length with  the patient today.  Concerns regarding medicines are outlined above.   Tests Ordered: No orders of the defined types were placed in this encounter.   Medication Changes: No orders of the defined types were placed in this encounter.   Follow Up:  In Person in 4 week(s) with Inspire Rep  Signed, Armanda Magic, MD  02/07/2023 10:23 AM    [facility]

## 2023-02-07 NOTE — Patient Instructions (Signed)
Medication Instructions:  Your physician recommends that you continue on your current medications as directed. Please refer to the Current Medication list given to you today.  *If you need a refill on your cardiac medications before your next appointment, please call your pharmacy*  Lab Work: None ordered today.  Testing/Procedures: None ordered today.  Follow-Up: Coralee North, our sleep coordinator, will arrange follow-up with you.

## 2023-02-11 ENCOUNTER — Other Ambulatory Visit: Payer: Self-pay | Admitting: Family Medicine

## 2023-02-11 ENCOUNTER — Other Ambulatory Visit (HOSPITAL_BASED_OUTPATIENT_CLINIC_OR_DEPARTMENT_OTHER): Payer: Self-pay

## 2023-02-11 MED ORDER — MOUNJARO 12.5 MG/0.5ML ~~LOC~~ SOAJ
12.5000 mg | SUBCUTANEOUS | 0 refills | Status: DC
  Filled 2023-02-18: qty 2, 28d supply, fill #0
  Filled 2023-03-17: qty 2, 28d supply, fill #1

## 2023-02-11 MED ORDER — TAMSULOSIN HCL 0.4 MG PO CAPS
0.4000 mg | ORAL_CAPSULE | Freq: Every day | ORAL | 1 refills | Status: DC
  Filled 2023-02-18: qty 90, 90d supply, fill #0
  Filled 2023-05-26: qty 90, 90d supply, fill #1

## 2023-02-11 MED ORDER — PANTOPRAZOLE SODIUM 40 MG PO TBEC
40.0000 mg | DELAYED_RELEASE_TABLET | Freq: Every day | ORAL | 0 refills | Status: DC
  Filled 2023-02-18: qty 90, 90d supply, fill #0

## 2023-02-11 MED ORDER — ONDANSETRON 4 MG PO TBDP
4.0000 mg | ORAL_TABLET | Freq: Three times a day (TID) | ORAL | 0 refills | Status: DC | PRN
  Filled 2023-02-18: qty 20, 7d supply, fill #0

## 2023-02-11 MED ORDER — BUPROPION HCL ER (XL) 300 MG PO TB24
300.0000 mg | ORAL_TABLET | Freq: Every day | ORAL | 0 refills | Status: DC
  Filled 2023-02-18 (×2): qty 90, 90d supply, fill #0

## 2023-02-14 ENCOUNTER — Other Ambulatory Visit (HOSPITAL_BASED_OUTPATIENT_CLINIC_OR_DEPARTMENT_OTHER): Payer: Self-pay

## 2023-02-14 MED ORDER — MULTI-VITAMIN PO TABS
1.0000 | ORAL_TABLET | Freq: Every day | ORAL | 1 refills | Status: DC
  Filled 2023-02-14: qty 90, 90d supply, fill #0
  Filled 2023-08-13: qty 130, 130d supply, fill #0
  Filled 2024-01-10: qty 130, 130d supply, fill #1

## 2023-02-14 MED ORDER — BIOTIN 10 MG PO TABS
1.0000 | ORAL_TABLET | Freq: Every day | ORAL | 1 refills | Status: DC
  Filled 2023-02-14: qty 90, 90d supply, fill #0

## 2023-02-16 NOTE — Progress Notes (Deleted)
Cardiology Office Note:    Date:  02/16/2023   ID:  Brandon [name], DOB [birth year], MRN [id number]  PCP:  Everrett Coombe, DO   [facility]  Cardiologist:  Meriam Sprague, MD  Advanced Practice Provider:  No care team member to display Electrophysiologist:  None   :[id number]}   Referring MD: Everrett Coombe, DO     History of Present Illness:    Brandon [name] is a 67 y.o. male with a hx of HTN, HLD, DMII, and CAD s/p PCI to RCA and Lcx who now presents to clinic for follow-up.  Patient was admitted to the hospital on 08/11/2020 with chest pain.  CTA of the chest showed no evidence of aortic dissection or aneurysm.  Respiratory panel negative for influenza and Covid.  D-dimer negative.  Troponin negative x2.  TTE 08/24/2020 showed EF 60 to 65%, no significant valve issue.  Given the atypical nature of his symptoms, he underwent coronary CT on 08/14/2020 which showed coronary calcium score of 1201 which placed the patient in 95th percentile for age and sex matched control, diffuse CAD with beadlike diabetic pattern with suspicion for severe stenosis in the mid RCA, ostial D2 and proximal left circumflex artery.  He subsequently underwent cardiac catheterization on 08/15/2020 which showed 90% mid RCA lesion, 75% proximal left circumflex artery, 75% OM 2, 100% OM1 with faint left to left and right-to-left collaterals, 70% RPDA, 75% D2, 30% distal LAD lesion.  The 90% mid RCA lesion was treated successfully with a 4.0 x 38 mm resolute DES.  The small disease in RPDA, OM1, OM2 at the diagonal vessel was managed medically. Postprocedure, he continued to have chest discomfort.  A bedside echocardiogram obtained on 08/15/2020 did not reveal any pericardial effusion, there was no change in LV function.  He was treated with colchicine 0.6 mg twice a day, colchicine was subsequently discontinued due to lack of recurrent symptoms.  Hemoglobin A1c obtained during this admission was  10, he has been advised to follow-up with his primary care provider.  During visit on 12/04/20, the patient was having significant DOE in the setting of known significant Lcx disease.He failed antianginal medication up-titration and therefore went for cath on 01/17/21 which revealed severe prox Lcx stenosis, severe mid Lcx stenosis and chronic occlusion of OM1. Proximal RCA stent was patent. Severe disease in PDA (small caliber) unchanged from last cath. He received PCI with DES to prox Lcx and mid Lcx.   He presented to the ED 10/22/21 for fatigue, confusion and malaise in the setting of low blood pressures. Work-up there with nonischemic ECG, normal trop, normal d-dimer, reassuring BNP. CXR without infiltrate. He clinically improved and was discharged home.   Was admitted 12/2021 for partial SBO that was managed conservatively.  Was last seen in clinic on 01/2022 where he was doing well from a CV standpoint. Remained active.  Today, he says he is feeling good. He is active without anginal symptoms. Has been compliant with his medications. A1C is much better controlled at 6.1 on insulin pump. He denies any palpitations, chest pain, shortness of breath, or peripheral edema. No lightheadedness, headaches, syncope, orthopnea, or PND. No bleeding issues on DAPT.  Past Medical History:  Diagnosis Date   Anxiety    Arthritis    Basal cell carcinoma (BCC) in situ of skin    left neck   Chronic back pain    has spinal cord stimulator and wears buprenorphine patch   Colon  polyps    Coronary artery disease    S/p DES to mRCA in 12/21 // S/p DES to LCx x 2 in 12/2020 // Diff dz in small to mod Dx (not a good target for PCI); OM1 100 CTO; severe dz in small OM2 and PDA >> Med Rx   Depression    Diabetes mellitus without complication (HCC)    type 2   Fibromyalgia    Gallstones    GERD (gastroesophageal reflux disease)    Hepatitis B    HLD (hyperlipidemia)    Hypertension    Osteoarthritis     Pneumonia    PONV (postoperative nausea and vomiting)    SBO (small bowel obstruction) (HCC)    Sleep apnea    does not use CPAP    Past Surgical History:  Procedure Laterality Date   CARDIAC CATHETERIZATION  01/17/2021   CATARACT EXTRACTION W/ INTRAOCULAR LENS  IMPLANT, BILATERAL     CHOLECYSTECTOMY     CHOLECYSTECTOMY, LAPAROSCOPIC     COLONOSCOPY WITH PROPOFOL N/A 12/19/2022   Procedure: COLONOSCOPY WITH PROPOFOL;  Surgeon: Jenel Lucks, MD;  Location: [facility] ENDOSCOPY;  Service: Gastroenterology;  Laterality: N/A;   CORONARY STENT INTERVENTION N/A 08/15/2020   Procedure: CORONARY STENT INTERVENTION;  Surgeon: Corky Crafts, MD;  Location: [facility] INVASIVE CV LAB;  Service: Cardiovascular;  Laterality: N/A;   CORONARY STENT INTERVENTION N/A 01/17/2021   Procedure: CORONARY STENT INTERVENTION;  Surgeon: Kathleene Hazel, MD;  Location: [facility] INVASIVE CV LAB;  Service: Cardiovascular;  Laterality: N/A;   CORONARY ULTRASOUND/IVUS N/A 08/15/2020   Procedure: Intravascular Ultrasound/IVUS;  Surgeon: Corky Crafts, MD;  Location: [facility] INVASIVE CV LAB;  Service: Cardiovascular;  Laterality: N/A;   DRUG INDUCED ENDOSCOPY N/A 05/23/2022   Procedure: DRUG INDUCED SLEEP ENDOSCOPY;  Surgeon: Osborn Coho, MD;  Location: [facility] OR;  Service: ENT;  Laterality: N/A;   HERNIA REPAIR     IMPLANTATION OF HYPOGLOSSAL NERVE STIMULATOR Right 07/30/2022   Procedure: IMPLANTATION OF HYPOGLOSSAL NERVE STIMULATOR;  Surgeon: Osborn Coho, MD;  Location: [facility];  Service: ENT;  Laterality: Right;   LEFT HEART CATH AND CORONARY ANGIOGRAPHY N/A 08/15/2020   Procedure: LEFT HEART CATH AND CORONARY ANGIOGRAPHY;  Surgeon: Corky Crafts, MD;  Location: [facility] INVASIVE CV LAB;  Service: Cardiovascular;  Laterality: N/A;   LEFT HEART CATH AND CORONARY ANGIOGRAPHY N/A 01/17/2021   Procedure: LEFT HEART CATH AND CORONARY ANGIOGRAPHY;  Surgeon: Kathleene Hazel, MD;  Location:  [facility] INVASIVE CV LAB;  Service: Cardiovascular;  Laterality: N/A;   LUMBAR FUSION     L3-5 with rods   MICRODISCECTOMY LUMBAR  2006   POLYPECTOMY  12/19/2022   Procedure: POLYPECTOMY;  Surgeon: Jenel Lucks, MD;  Location: [facility] ENDOSCOPY;  Service: Gastroenterology;;   SPINAL CORD STIMULATOR INSERTION     blood clot removed from spinal cord   SPINE SURGERY     blood clot removed from spinal cord   XI ROBOTIC ASSISTED INGUINAL HERNIA REPAIR WITH MESH     x 3 surgeries    Current Medications: No outpatient medications have been marked as taking for the 02/19/23 encounter (Appointment) with Meriam Sprague, MD.     Allergies:   Kiwi extract, Other, Penicillins, Ancef [cefazolin], Latex, Levaquin [levofloxacin], and Peach [prunus persica]   Social History   Socioeconomic History   Marital status: Married    Spouse name: Not on file   Number of children: 4   Years of education: 24  Highest education level: Not on file  Occupational History   Occupation: disabled/ respiratory therapist  Tobacco Use   Smoking status: Former    Packs/day: 1.00    Years: 30.00    Additional pack years: 0.00    Total pack years: 30.00    Types: Cigarettes    Quit date: 2007    Years since quitting: 17.4   Smokeless tobacco: Never  Vaping Use   Vaping Use: Never used  Substance and Sexual Activity   Alcohol use: Not Currently   Drug use: Not Currently   Sexual activity: Yes  Other Topics Concern   Not on file  Social History Narrative   Not on file   Social Determinants of Health   Financial Resource Strain: Not on file  Food Insecurity: No Food Insecurity (07/13/2022)   Hunger Vital Sign    Worried About Running Out of Food in the Last Year: Never true    Ran Out of Food in the Last Year: Never true  Transportation Needs: No Transportation Needs (07/13/2022)   PRAPARE - Administrator, Civil Service (Medical): No    Lack of Transportation (Non-Medical): No   Physical Activity: Not on file  Stress: Not on file  Social Connections: Not on file     Family History: The patient's family history includes Anxiety disorder in his father and mother; CAD in his brother; COPD in his mother; Cancer in his father; Colon cancer in his mother; Coronary artery disease in his father; Depression in his father and mother; Leukemia in his maternal grandmother; Multiple sclerosis in his daughter. There is no history of Diabetes.  ROS:   Please see the history of present illness. Review of Systems  Constitutional:  Negative for chills and fever.  HENT:  Negative for congestion and sore throat.   Eyes:  Negative for blurred vision and discharge.  Respiratory:  Negative for cough and shortness of breath.   Cardiovascular:  Negative for chest pain, palpitations, orthopnea, claudication, leg swelling and PND.  Gastrointestinal:  Negative for nausea and vomiting.  Genitourinary:  Negative for dysuria and urgency.  Musculoskeletal:  Positive for back pain. Negative for myalgias.  Skin:  Negative for itching and rash.  Neurological:  Negative for dizziness and headaches.  Endo/Heme/Allergies:  Negative for polydipsia. Does not bruise/bleed easily.  Psychiatric/Behavioral:  The patient is not nervous/anxious and does not have insomnia.    All other systems reviewed and are negative.  EKGs/Labs/Other Studies Reviewed:    The following studies were reviewed today:  TTE 11/01/2021: IMPRESSIONS    1. Left ventricular ejection fraction, by estimation, is 60 to 65%. The  left ventricle has normal function. The left ventricle has no regional  wall motion abnormalities. There is moderate concentric left ventricular  hypertrophy. Left ventricular  diastolic parameters are consistent with Grade I diastolic dysfunction  (impaired relaxation).   2. Right ventricular systolic function is normal. The right ventricular  size is normal. Tricuspid regurgitation signal is  inadequate for assessing  PA pressure.   3. The mitral valve is normal in structure. No evidence of mitral valve  regurgitation. No evidence of mitral stenosis.   4. The aortic valve is tricuspid. Aortic valve regurgitation is not  visualized.   5. The inferior vena cava is normal in size with greater than 50%  respiratory variability, suggesting right atrial pressure of 3 mmHg.   Comparison(s): No significant change from prior study.   Left heart cath 01/17/2021: 2nd Diag-2  lesion is 75% stenosed. 2nd Diag-1 lesion is 75% stenosed. Dist LAD lesion is 50% stenosed. 2nd Mrg lesion is 75% stenosed. 1st Mrg lesion is 100% stenosed. RPDA lesion is 70% stenosed. Previously placed Mid RCA stent (unknown type) is widely patent. Dist RCA lesion is 50% stenosed. Ost RCA to Prox RCA lesion is 30% stenosed. Mid Cx lesion is 90% stenosed. Ost Cx to Prox Cx lesion is 75% stenosed. A drug-eluting stent was successfully placed using a STENT RESOLUTE ONYX 3.5X15. Post intervention, there is a 0% residual stenosis. A drug-eluting stent was successfully placed using a STENT RESOLUTE ONYX 2.25X8. Post intervention, there is a 0% residual stenosis.   1. Non-obstructive disease in the LAD. Diffuse disease in the small to moderate caliber Diagonal branch, unchanged from last cath and not a good target for PCI.  2. Severe proximal Circumflex stenosis. Severe mid Circumflex stenosis. Chronic occlusion OM1. Severe diffuse disease in the small caliber second OM branch.  4. Patent proximal to mid RCA stent. Severe disease in the small caliber PDA, unchanged from last cath.  5. Successful PTCA/DES x 1 proximal Circumflex 6. Successful PTCA/DES x 1 mid Circumflex   Echo 08/13/2020  1. Left ventricular ejection fraction, by estimation, is 60 to 65%. The  left ventricle has normal function. The left ventricle has no regional  wall motion abnormalities. There is mild left ventricular hypertrophy.  Left  ventricular diastolic parameters  are consistent with Grade I diastolic dysfunction (impaired relaxation).   2. Right ventricular systolic function is normal. The right ventricular  size is normal.   3. The mitral valve is normal in structure. No evidence of mitral valve  regurgitation. No evidence of mitral stenosis.   4. The aortic valve is tricuspid. Aortic valve regurgitation is not  visualized. No aortic stenosis is present.   5. The inferior vena cava is normal in size with greater than 50%  respiratory variability, suggesting right atrial pressure of 3 mmHg.  Cath 08/15/20: Mid RCA lesion is 90% stenosed. Prox Cx lesion is 75% stenosed. 2nd Mrg lesion is 75% stenosed. 1st Mrg lesion is 100% stenosed. Faint right to left and left to left collaterals. RPDA lesion is 70% stenosed. 2nd Diag-1 lesion is 75% stenosed. 2nd Diag-2 lesion is 75% stenosed. Dist LAD lesion is 50% stenosed. A drug-eluting stent was successfully placed using a STENT RESOLUTE ONYX 4.0X38, optimized with IVUS. Post intervention, there is a 0% residual stenosis. LV end diastolic pressure is normal. There is no aortic valve stenosis. 75 cm long sheath needed for support due to tortuosity in the right subclavian to engage the RCA. Left coronary could be engaged easily with short sheath.   Diffuse multivessel disease, but no LAD involvement.     RCA successfully treated.  Small vessel disease noted in the RPDA, OM1 and OM2, second diagonal and apical LAD.  Proximal circumflex would be a potential target for PCI if he had more symptoms.   CTA 08/14/20: 1. Left Main: 0.98. 2. LAD: Proximal: 0.97, distal: 0.87. 3. D1: Proximal: 0.75. 4. LCX: Proximal; 0.94, mid: Occluded. 5. RCA: Proximal: 0.98, distal: 0.77.   IMPRESSION: 1. CT FFR analysis showed significant stenoses in proximal RCA, ostial portion of a large D1 and occluded proximal portion of LCX artery. Left cardiac catheterization is  recommended.   FINDINGS: A 100 kV prospective scan was triggered in the descending thoracic aorta at 111 HU's. Axial non-contrast 3 mm slices were carried out through the heart. The data set was  analyzed on a dedicated work station and scored using the [employer] . Gantry rotation speed was 250 msecs and collimation was .6 mm. 100 mg of PO Metoprolol and 0.8 mg of sl NTG were given. The 3D data set was reconstructed in 5% intervals of the 67-82 % of the R-R cycle. Diastolic phases were analyzed on a dedicated work station using MPR, MIP and VRT modes. The patient received 80 cc of contrast.   Aorta: Normal size. Mild diffuse atherosclerotic plaque and calcifications. No dissection.   Aortic Valve:  Trileaflet.  No calcifications.   Coronary Arteries:  Normal coronary origin.  Right dominance.   RCA is a large dominant artery that gives rise to PDA and PLA. There is mild calcified plaque in the proximal RCA with stenosis 25-49%. Mid RCA has severe diffuse predominantly calcified plaque with a focal stenosis suspicious for > 70%. Distal RCA has moderate calcified plaque prior to bifurcation into PLA and PDA with stenosis 50-69%.   PLA is small with diffuse moderate plaque.   PDA is a medium caliber artery with diffuse moderate plaque in a bead like pattern.   Left main is a large artery that gives rise to LAD and LCX arteries. Left main is a long artery with minimal calcified plaque and stenosis 0-25%.   LAD is a large vessel that gives rise to two diagonal arteries. There is mild diffuse calcified plaque with focal stenoses in the proximal and mid LAD of 25-49%.   D1 is very small.   D2 is a medium caliber artery (2.1 mm) that has severe mixed ostial plaque with lipid rich core and is suspicious for stenosis > 70%.   LCX is a large caliber non-dominant artery that gives rise to two OM branches. Proximal LCX artery has severe non-calcified plaque suspicious for  stenosis > 70%, this is followed by a moderate diffuse calcified plaque with stenosis 50-69%. Mid and distal LCX artery has small lumen and mild diffuse plaque.   OM1 is very small and has severe diffuse plaque.   OM2 is a medium caliber artery with diffuse moderate plaque in a bead like pattern.   Other findings:   Normal pulmonary vein drainage into the left atrium.   Normal left atrial appendage without a thrombus.   Normal size of the pulmonary artery.   IMPRESSION: 1. Coronary calcium score of 1201. This was 95 percentile for age and sex matched control.   2. Normal coronary origin with right dominance.   3. CAD-RADS 4 Severe stenosis. (70-99%). Diffuse CAD in a bead-like diabetic pattern, there is suspicion for severe stenoses in the mid RCA, ostial portion of a large D2 and proximal portion of LCX artery. Additional analysis with CT FFR will be submitted. We will tentatively plan for a left cardiac catheterization for tomorrow.   EKG: EKG is personally reviewed. 08/21/22: EKG was not ordered. 02/15/22: EKG with NSR with HR 87  Recent Labs: 07/13/2022: Magnesium 1.9 09/26/2022: ALT 11; BUN 11; Creat 0.75; Hemoglobin 14.3; Platelets 209; Potassium 4.0; Sodium 141   Recent Lipid Panel    Component Value Date/Time   CHOL 105 03/26/2022 0000   CHOL 113 10/23/2020 0824   TRIG 256 (H) 03/26/2022 0000   HDL 32 (L) 03/26/2022 0000   HDL 45 10/23/2020 0824   CHOLHDL 3.3 03/26/2022 0000   VLDL 22 11/01/2021 0334   LDLCALC 43 03/26/2022 0000     Physical Exam:    VS:  There were no vitals taken for  this visit.    Wt Readings from Last 3 Encounters:  02/07/23 198 lb 6.4 oz (90 kg)  01/09/23 191 lb (86.6 kg)  12/19/22 193 lb (87.5 kg)     GEN: Well nourished, well developed in no acute distress HEENT: Normal NECK: No JVD; No carotid bruits CARDIAC: RRR, 1/6 systolic murmur. No rubs, gallops RESPIRATORY:  Clear to auscultation without rales, wheezing or rhonchi   ABDOMEN: Soft, non-tender, non-distended MUSCULOSKELETAL:  no edema; No deformity  SKIN: Warm and dry NEUROLOGIC:  Alert and oriented x 3 PSYCHIATRIC:  Normal affect   ASSESSMENT:    No diagnosis found.  PLAN:    In order of problems listed above:  #Multivessel CAD s/p PCI to RCA and recent PCI x2 to Lcx: Patient with PCI to RCA with residual LCx 75%, 50% LAD,  100% OM1,  75% OM2, 75% D1 and D2 in 07/2020 with recurrent angina now s/p PCI to prox and mid Lcx on 01/17/21. Now with significant improvement of symptoms. Able to exercise without issues. Will continue medical therapy as below. -Stop ASA 81mg  daily as completed >1 year of DAPT -Continue plavix 75mg  daily; Okay to hold for colonoscopy -Continue lipitor 80mg  daily -Continue metop 25mg  XL daily -Continue imdur 30mg  daily -Off losartan due to lightheadedness -Continue nitro as needed for chest pain  #HTN: Well controlled <120/80s with no orthostatic symptoms. -Off amlodipine and losartan due to hypotension -Continue metop 25 and imdur 30mg  daily  #DMII: A1C significantly improved from 10-->6.1 -Continue insulin, jardiance and ozempic  #HLD: LDL at goal 43 in 03/2022. -Continue lipitor 80mg  daily -Goal LDL<55  -Will have labs with PCP  #Obesity: Significantly improved with BMI 35-->29. -Mediterranean diet -Continue exercise with goal 88min/day -Continue ozempic  #Known OSA not on CPAP: Patient with history of OSA now s/p Inspire device placement -Follow-up with Dr. Mayford Knife as scheduled   Follow up: 6months   Medication Adjustments/Labs and Tests Ordered: Current medicines are reviewed at length with the patient today.  Concerns regarding medicines are outlined above.  No orders of the defined types were placed in this encounter.  No orders of the defined types were placed in this encounter.  There are no Patient Instructions on file for this visit.   I,Breanna Adamick,acting as a scribe for Meriam Sprague, MD.,have documented all relevant documentation on the behalf of Meriam Sprague, MD,as directed by  Meriam Sprague, MD while in the presence of Meriam Sprague, MD.   I, Meriam Sprague, MD, have reviewed all documentation for this visit. The documentation on 02/16/23 for the exam, diagnosis, procedures, and orders are all accurate and complete.   Signed, Meriam Sprague, MD  02/16/2023 8:47 PM    [facility]

## 2023-02-18 ENCOUNTER — Other Ambulatory Visit (HOSPITAL_BASED_OUTPATIENT_CLINIC_OR_DEPARTMENT_OTHER): Payer: Self-pay

## 2023-02-18 ENCOUNTER — Other Ambulatory Visit: Payer: Self-pay

## 2023-02-19 ENCOUNTER — Ambulatory Visit: Payer: 59 | Admitting: Cardiology

## 2023-02-19 ENCOUNTER — Other Ambulatory Visit (HOSPITAL_BASED_OUTPATIENT_CLINIC_OR_DEPARTMENT_OTHER): Payer: Self-pay

## 2023-02-25 ENCOUNTER — Other Ambulatory Visit (HOSPITAL_BASED_OUTPATIENT_CLINIC_OR_DEPARTMENT_OTHER): Payer: Self-pay

## 2023-02-26 DIAGNOSIS — E119 Type 2 diabetes mellitus without complications: Secondary | ICD-10-CM | POA: Diagnosis not present

## 2023-02-27 DIAGNOSIS — G4733 Obstructive sleep apnea (adult) (pediatric): Secondary | ICD-10-CM | POA: Diagnosis not present

## 2023-03-06 ENCOUNTER — Other Ambulatory Visit (HOSPITAL_BASED_OUTPATIENT_CLINIC_OR_DEPARTMENT_OTHER): Payer: Self-pay

## 2023-03-07 ENCOUNTER — Other Ambulatory Visit (HOSPITAL_BASED_OUTPATIENT_CLINIC_OR_DEPARTMENT_OTHER): Payer: Self-pay

## 2023-03-07 ENCOUNTER — Other Ambulatory Visit: Payer: Self-pay

## 2023-03-09 DIAGNOSIS — G4734 Idiopathic sleep related nonobstructive alveolar hypoventilation: Secondary | ICD-10-CM | POA: Diagnosis not present

## 2023-03-11 ENCOUNTER — Other Ambulatory Visit: Payer: Self-pay

## 2023-03-11 DIAGNOSIS — Z794 Long term (current) use of insulin: Secondary | ICD-10-CM

## 2023-03-11 MED ORDER — DEXCOM G6 SENSOR MISC
1.0000 | 3 refills | Status: DC
Start: 2023-03-11 — End: 2023-12-18

## 2023-03-14 ENCOUNTER — Other Ambulatory Visit (HOSPITAL_BASED_OUTPATIENT_CLINIC_OR_DEPARTMENT_OTHER): Payer: Self-pay

## 2023-03-17 ENCOUNTER — Other Ambulatory Visit (HOSPITAL_BASED_OUTPATIENT_CLINIC_OR_DEPARTMENT_OTHER): Payer: Self-pay

## 2023-03-17 ENCOUNTER — Telehealth: Payer: Self-pay | Admitting: Cardiology

## 2023-03-17 NOTE — Telephone Encounter (Signed)
Pt c/o BP issue: STAT if pt c/o blurred vision, one-sided weakness or slurred speech  1. What are your last 5 BP readings?  70/30  2. Are you having any other symptoms (ex. Dizziness, headache, blurred vision, passed out)? Sleepiness, low energy, very tired/fatigued  3. What is your BP issue?   BP is extremely low.

## 2023-03-17 NOTE — Telephone Encounter (Signed)
Called patient back about his message. Patient is complaining of low blood pressure that has been going on for several days. Patient stated he takes his morning medications metoprolol and Imdur, then his BP drops. Patient stated his BP was 70/30 then he had coffee and breakfast, SBP came up to 80's. Patient's current HR is 76. Patient stated that the person who is with him stated he is pale and grey. Since patient's BP is extremely low and he is symptomatic with fatigue, weakness, etc. Advised him to go to the ED or call 911. Patient verbalized understanding.

## 2023-03-18 ENCOUNTER — Ambulatory Visit (HOSPITAL_BASED_OUTPATIENT_CLINIC_OR_DEPARTMENT_OTHER): Payer: 59 | Attending: Cardiology | Admitting: Cardiology

## 2023-03-18 DIAGNOSIS — G4733 Obstructive sleep apnea (adult) (pediatric): Secondary | ICD-10-CM | POA: Insufficient documentation

## 2023-03-19 ENCOUNTER — Other Ambulatory Visit (HOSPITAL_BASED_OUTPATIENT_CLINIC_OR_DEPARTMENT_OTHER): Payer: Self-pay

## 2023-03-21 NOTE — Procedures (Signed)
   Patient Name: Brandon [name], Brandon [name] Date: 03/18/2023 Gender: Male D.O.B: 23-Mar-1956 Age (years): 34 Referring Provider: Armanda Magic MD, ABSM Height (inches): 58 Interpreting Physician: Armanda Magic MD, ABSM Weight (lbs): 191 RPSGT: [place] Sink BMI: 40 MRN: [id number] Neck Size: 17.00  CLINICAL INFORMATION The patient is referred for a Inspire titration to treat sleep apnea.  SLEEP STUDY TECHNIQUE As per the AASM Manual for the Scoring of Sleep and Associated Events v2.3 (April 2016) with a hypopnea requiring 4% desaturations.  The channels recorded and monitored were frontal, central and occipital EEG, electrooculogram (EOG), submentalis EMG (chin), nasal and oral airflow, thoracic and abdominal wall motion, anterior tibialis EMG, snore microphone, electrocardiogram, and pulse oximetry. Inspire output was initiated at the beginning of the study and titrated to treat sleep-disordered breathing.  MEDICATIONS Medications self-administered by patient taken the night of the study : N/A  TECHNICIAN COMMENTS Comments added by technician: None Comments added by scorer: N/A  RESPIRATORY PARAMETERS Optimal Output Amplitude (cm):N/A  AHI at Optimal  Amplitude (/hr):N/A Overall Minimal O2 (%):72.0  Supine % at Optimal Amplitude (%):N/A Minimal O2 at Optimal Amplitude (%):N/A   SLEEP ARCHITECTURE The study was initiated at 9:30:22 PM and ended at 4:11:33 AM.  Sleep onset time was 0.0 minutes and the sleep efficiency was 86.0%. The total sleep time was 345 minutes.  The patient spent 8.6% of the night in stage N1 sleep, 62.8% in stage N2 sleep, 0.0% in stage N3 and 28.7% in REM.Stage REM latency was 33.0 minutes  Wake after sleep onset was 56.2. Alpha intrusion was absent. Supine sleep was 78.74%.  CARDIAC DATA The 2 lead EKG demonstrated sinus rhythm. The mean heart rate was 64.7 beats per minute. Other EKG findings include: None.  LEG MOVEMENT DATA The total Periodic Limb  Movements of Sleep (PLMS) were 0. The PLMS index was 0.0. A PLMS index of <15 is considered normal in adults.  IMPRESSIONS - The optimal Inspire Amplitude could not be obtained due to ongoing respiratory events.  - Severe oxygen desaturations were observed during this titration (min O2 = 72.0%). - The patient snored with moderate snoring volume during this titration study. - No cardiac abnormalities were observed during this study. - Clinically significant periodic limb movements were not noted during this study. Arousals associated with PLMs were rare.  DIAGNOSIS - Obstructive Sleep Apnea (G47.33)  RECOMMENDATIONS - Return to sleep clinic for fine tuning of Inspire device. - Avoid alcohol, sedatives and other CNS depressants that may worsen sleep apnea and disrupt normal sleep architecture. - Sleep hygiene should be reviewed to assess factors that may improve sleep quality. - Weight management and regular exercise should be initiated or continued.  [Electronically signed] 03/21/2023 01:18 PM  Armanda Magic MD, ABSM Diplomate, American Board of Sleep Medicine

## 2023-03-24 ENCOUNTER — Other Ambulatory Visit: Payer: Self-pay | Admitting: Family Medicine

## 2023-03-25 ENCOUNTER — Other Ambulatory Visit (HOSPITAL_BASED_OUTPATIENT_CLINIC_OR_DEPARTMENT_OTHER): Payer: Self-pay

## 2023-03-25 MED ORDER — BUPROPION HCL ER (XL) 150 MG PO TB24
150.0000 mg | ORAL_TABLET | Freq: Every day | ORAL | 1 refills | Status: DC
  Filled 2023-03-25: qty 90, 90d supply, fill #0
  Filled 2023-07-07 – 2023-08-13 (×4): qty 90, 90d supply, fill #1

## 2023-03-27 ENCOUNTER — Other Ambulatory Visit (HOSPITAL_BASED_OUTPATIENT_CLINIC_OR_DEPARTMENT_OTHER): Payer: Self-pay

## 2023-03-27 ENCOUNTER — Encounter: Payer: Self-pay | Admitting: Family Medicine

## 2023-03-27 ENCOUNTER — Ambulatory Visit (INDEPENDENT_AMBULATORY_CARE_PROVIDER_SITE_OTHER): Payer: 59 | Admitting: Family Medicine

## 2023-03-27 VITALS — BP 148/73 | HR 80 | Ht 68.0 in | Wt 203.0 lb

## 2023-03-27 DIAGNOSIS — E291 Testicular hypofunction: Secondary | ICD-10-CM

## 2023-03-27 DIAGNOSIS — I25118 Atherosclerotic heart disease of native coronary artery with other forms of angina pectoris: Secondary | ICD-10-CM | POA: Diagnosis not present

## 2023-03-27 DIAGNOSIS — Z794 Long term (current) use of insulin: Secondary | ICD-10-CM

## 2023-03-27 DIAGNOSIS — Z125 Encounter for screening for malignant neoplasm of prostate: Secondary | ICD-10-CM | POA: Diagnosis not present

## 2023-03-27 DIAGNOSIS — G4733 Obstructive sleep apnea (adult) (pediatric): Secondary | ICD-10-CM | POA: Diagnosis not present

## 2023-03-27 DIAGNOSIS — Z23 Encounter for immunization: Secondary | ICD-10-CM | POA: Diagnosis not present

## 2023-03-27 DIAGNOSIS — Z0000 Encounter for general adult medical examination without abnormal findings: Secondary | ICD-10-CM | POA: Diagnosis not present

## 2023-03-27 DIAGNOSIS — I10 Essential (primary) hypertension: Secondary | ICD-10-CM

## 2023-03-27 DIAGNOSIS — I2089 Other forms of angina pectoris: Secondary | ICD-10-CM

## 2023-03-27 DIAGNOSIS — E1169 Type 2 diabetes mellitus with other specified complication: Secondary | ICD-10-CM | POA: Diagnosis not present

## 2023-03-27 DIAGNOSIS — I2510 Atherosclerotic heart disease of native coronary artery without angina pectoris: Secondary | ICD-10-CM

## 2023-03-27 MED ORDER — TIRZEPATIDE 15 MG/0.5ML ~~LOC~~ SOAJ
15.0000 mg | SUBCUTANEOUS | 1 refills | Status: DC
Start: 2023-03-27 — End: 2023-06-11
  Filled 2023-03-27 – 2023-04-02 (×5): qty 6, 84d supply, fill #0
  Filled 2023-04-09: qty 2, 28d supply, fill #0
  Filled 2023-05-05: qty 2, 28d supply, fill #1
  Filled 2023-06-03: qty 2, 28d supply, fill #2

## 2023-03-27 NOTE — Progress Notes (Signed)
Brandon [name] - 67 y.o. male MRN [id number]  Date of birth: [birth year]  Subjective Chief Complaint  Patient presents with   Annual Exam    HPI Brandon [name] is a 67 y.o. male here today for annual.  He reports that he is doing ok..  He has a new job working at a Psychologist, sport and exercise at [employer].  This is requiring him to stand for long hours.  He is requesting reasonable accomodation of a chair to use while working due to his chornic back pain.    He continues on moujaro and jardiance for management of diabetes.  He is doing well overall with this.  Tolerating ok at current strength.  Weight has been stable.  He is trying to stay fairly active.  Mood is stable bupropion 450mg  daily with abilify.  No side effects at current strnegth of medications.   BP is well controlled.  He feels that he may get some episodes of hypotension.  He remains on Imdur and metoprolol.  He is seeing a new cardiologist next month.  He is still on antiplatelet therapy with plavix.  Tolerating atorvastatin well at current strength.    Feels pretty good with current strength of androgel.     Review of Systems  Constitutional:  Negative for chills, fever, malaise/fatigue and weight loss.  HENT:  Negative for congestion, ear pain and sore throat.   Eyes:  Negative for blurred vision, double vision and pain.  Respiratory:  Negative for cough and shortness of breath.   Cardiovascular:  Negative for chest pain and palpitations.  Gastrointestinal:  Negative for abdominal pain, blood in stool, constipation, heartburn and nausea.  Genitourinary:  Negative for dysuria and urgency.  Musculoskeletal:  Negative for joint pain and myalgias.  Neurological:  Negative for dizziness and headaches.  Endo/Heme/Allergies:  Does not bruise/bleed easily.  Psychiatric/Behavioral:  Negative for depression. The patient is not nervous/anxious and does not have insomnia.      Allergies  Allergen Reactions   Kiwi Extract Swelling    Mouth  swelling.    Other Swelling and Other (See Comments)    EGGPLANT. Mouth swelling.   Penicillins Rash    Reaction: unknown   Ancef [Cefazolin] Rash   Latex Rash   Levaquin [Levofloxacin] Rash   Peach [Prunus Persica] Other (See Comments)    Burns mouth    Past Medical History:  Diagnosis Date   Anxiety    Arthritis    Basal cell carcinoma (BCC) in situ of skin    left neck   Chronic back pain    has spinal cord stimulator and wears buprenorphine patch   Colon polyps    Coronary artery disease    S/p DES to mRCA in 12/21 // S/p DES to LCx x 2 in 12/2020 // Diff dz in small to mod Dx (not a good target for PCI); OM1 100 CTO; severe dz in small OM2 and PDA >> Med Rx   Depression    Diabetes mellitus without complication (HCC)    type 2   Fibromyalgia    Gallstones    GERD (gastroesophageal reflux disease)    Hepatitis B    HLD (hyperlipidemia)    Hypertension    Osteoarthritis    Pneumonia    PONV (postoperative nausea and vomiting)    SBO (small bowel obstruction) (HCC)    Sleep apnea    does not use CPAP    Past Surgical History:  Procedure Laterality Date  CARDIAC CATHETERIZATION  01/17/2021   CATARACT EXTRACTION W/ INTRAOCULAR LENS  IMPLANT, BILATERAL     CHOLECYSTECTOMY     CHOLECYSTECTOMY, LAPAROSCOPIC     COLONOSCOPY WITH PROPOFOL N/A 12/19/2022   Procedure: COLONOSCOPY WITH PROPOFOL;  Surgeon: Jenel Lucks, MD;  Location: [facility] ENDOSCOPY;  Service: Gastroenterology;  Laterality: N/A;   CORONARY STENT INTERVENTION N/A 08/15/2020   Procedure: CORONARY STENT INTERVENTION;  Surgeon: Corky Crafts, MD;  Location: [facility] INVASIVE CV LAB;  Service: Cardiovascular;  Laterality: N/A;   CORONARY STENT INTERVENTION N/A 01/17/2021   Procedure: CORONARY STENT INTERVENTION;  Surgeon: Kathleene Hazel, MD;  Location: [facility] INVASIVE CV LAB;  Service: Cardiovascular;  Laterality: N/A;   CORONARY ULTRASOUND/IVUS N/A 08/15/2020   Procedure: Intravascular  Ultrasound/IVUS;  Surgeon: Corky Crafts, MD;  Location: [facility] INVASIVE CV LAB;  Service: Cardiovascular;  Laterality: N/A;   DRUG INDUCED ENDOSCOPY N/A 05/23/2022   Procedure: DRUG INDUCED SLEEP ENDOSCOPY;  Surgeon: Osborn Coho, MD;  Location: [facility] OR;  Service: ENT;  Laterality: N/A;   HERNIA REPAIR     IMPLANTATION OF HYPOGLOSSAL NERVE STIMULATOR Right 07/30/2022   Procedure: IMPLANTATION OF HYPOGLOSSAL NERVE STIMULATOR;  Surgeon: Osborn Coho, MD;  Location: [facility];  Service: ENT;  Laterality: Right;   LEFT HEART CATH AND CORONARY ANGIOGRAPHY N/A 08/15/2020   Procedure: LEFT HEART CATH AND CORONARY ANGIOGRAPHY;  Surgeon: Corky Crafts, MD;  Location: [facility] INVASIVE CV LAB;  Service: Cardiovascular;  Laterality: N/A;   LEFT HEART CATH AND CORONARY ANGIOGRAPHY N/A 01/17/2021   Procedure: LEFT HEART CATH AND CORONARY ANGIOGRAPHY;  Surgeon: Kathleene Hazel, MD;  Location: [facility] INVASIVE CV LAB;  Service: Cardiovascular;  Laterality: N/A;   LUMBAR FUSION     L3-5 with rods   MICRODISCECTOMY LUMBAR  2006   POLYPECTOMY  12/19/2022   Procedure: POLYPECTOMY;  Surgeon: Jenel Lucks, MD;  Location: [facility] ENDOSCOPY;  Service: Gastroenterology;;   SPINAL CORD STIMULATOR INSERTION     blood clot removed from spinal cord   SPINE SURGERY     blood clot removed from spinal cord   XI ROBOTIC ASSISTED INGUINAL HERNIA REPAIR WITH MESH     x 3 surgeries    Social History   Socioeconomic History   Marital status: Married    Spouse name: Not on file   Number of children: 4   Years of education: 16   Highest education level: Not on file  Occupational History   Occupation: disabled/ respiratory therapist  Tobacco Use   Smoking status: Former    Current packs/day: 0.00    Average packs/day: 1 pack/day for 30.0 years (30.0 ttl pk-yrs)    Types: Cigarettes    Start date: 51    Quit date: 2007    Years since quitting: 17.5   Smokeless tobacco: Never  Vaping  Use   Vaping status: Never Used  Substance and Sexual Activity   Alcohol use: Not Currently   Drug use: Not Currently   Sexual activity: Yes  Other Topics Concern   Not on file  Social History Narrative   Not on file   Social Determinants of Health   Financial Resource Strain: Not on file  Food Insecurity: No Food Insecurity (07/13/2022)   Hunger Vital Sign    Worried About Running Out of Food in the Last Year: Never true    Ran Out of Food in the Last Year: Never true  Transportation Needs: No Transportation Needs (07/13/2022)   PRAPARE - Transportation  Lack of Transportation (Medical): No    Lack of Transportation (Non-Medical): No  Physical Activity: Not on file  Stress: Not on file  Social Connections: Not on file    Family History  Problem Relation Age of Onset   COPD Mother    Anxiety disorder Mother    Depression Mother    Colon cancer Mother    Cancer Father        mets, origin unknown   Anxiety disorder Father    Depression Father    Coronary artery disease Father    CAD Brother    Leukemia Maternal Grandmother    Multiple sclerosis Daughter    Diabetes Neg Hx     Health Maintenance  Topic Date Due   Diabetic kidney evaluation - Urine ACR  03/27/2023   HEMOGLOBIN A1C  03/27/2023   COVID-19 Vaccine (3 - 2023-24 season) 04/12/2023 (Originally 04/26/2022)   Medicare Annual Wellness (AWV)  04/27/2023 (Originally [birth year])   Zoster Vaccines- Shingrix (1 of 2) 06/27/2023 (Originally 09/13/2005)   INFLUENZA VACCINE  11/24/2023 (Originally 03/27/2023)   Hepatitis C Screening  03/26/2024 (Originally 09/13/1973)   FOOT EXAM  05/14/2023   Diabetic kidney evaluation - eGFR measurement  09/27/2023   OPHTHALMOLOGY EXAM  12/04/2023   Colonoscopy  12/18/2032   DTaP/Tdap/Td (2 - Td or Tdap) 03/26/2033   Pneumonia Vaccine 28+ Years old  Completed   HPV VACCINES  Aged Out      ----------------------------------------------------------------------------------------------------------------------------------------------------------------------------------------------------------------- Physical Exam BP (!) 148/73 (BP Location: Left Arm, Patient Position: Sitting, Cuff Size: Normal)   Pulse 80   Ht 5\' 8"  (1.727 m)   Wt 203 lb (92.1 kg)   SpO2 99%   BMI 30.87 kg/m   Physical Exam Constitutional:      General: He is not in acute distress.    Appearance: Normal appearance.  HENT:     Head: Normocephalic and atraumatic.     Right Ear: Tympanic membrane and external ear normal.     Left Ear: Tympanic membrane and external ear normal.  Eyes:     General: No scleral icterus. Neck:     Thyroid: No thyromegaly.  Cardiovascular:     Rate and Rhythm: Normal rate and regular rhythm.     Heart sounds: Normal heart sounds.  Pulmonary:     Effort: Pulmonary effort is normal.     Breath sounds: Normal breath sounds.  Abdominal:     General: Bowel sounds are normal. There is no distension.     Palpations: Abdomen is soft.     Tenderness: There is no abdominal tenderness. There is no guarding.  Musculoskeletal:     Cervical back: Normal range of motion and neck supple.  Lymphadenopathy:     Cervical: No cervical adenopathy.  Skin:    General: Skin is warm and dry.     Findings: No rash.  Neurological:     General: No focal deficit present.     Mental Status: He is alert and oriented to person, place, and time.     Cranial Nerves: No cranial nerve deficit.     Motor: No abnormal muscle tone.  Psychiatric:        Mood and Affect: Mood normal.        Behavior: Behavior normal.     ------------------------------------------------------------------------------------------------------------------------------------------------------------------------------------------------------------------- Assessment and Plan  Primary hypertension He has felt that his  BP gets too low when taking metoprolol and imdur together.  Will have him take imdur in the am and toprol in the  evening at half strength until seen by cardiology.   Coronary artery disease Denies new anginal symptoms.  Has f/u with cardiology next month.    Well adult exam Well adult Orders Placed This Encounter  Procedures   Tdap vaccine greater than or equal to 7yo IM   CMP14+EGFR   CBC with Differential   PSA   Testosterone   HgB A1c   Lipid Panel With LDL/HDL Ratio   Urine Microalbumin w/creat. ratio  Screenings: per lab orders Immunizations:  Tdap Anticipatory guidance/Risk factor reduction:  Recommendations per AVS   Meds ordered this encounter  Medications   tirzepatide (MOUNJARO) 15 MG/0.5ML Pen    Sig: Inject 15 mg into the skin once a week.    Dispense:  6 mL    Refill:  1    Into the Return in about 6 months (around 09/27/2023) for T2DM/HTN.    This visit occurred during the SARS-CoV-2 public health emergency.  Safety protocols were in place, including screening questions prior to the visit, additional usage of staff PPE, and extensive cleaning of exam room while observing appropriate contact time as indicated for disinfecting solutions.

## 2023-03-27 NOTE — Patient Instructions (Addendum)
Take metoprolol 1/2 tab. Take separate from imdur.

## 2023-03-27 NOTE — Assessment & Plan Note (Signed)
Well adult Orders Placed This Encounter  Procedures   Tdap vaccine greater than or equal to 67yo IM   CMP14+EGFR   CBC with Differential   PSA   Testosterone   HgB A1c   Lipid Panel With LDL/HDL Ratio   Urine Microalbumin w/creat. ratio  Screenings: per lab orders Immunizations:  Tdap Anticipatory guidance/Risk factor reduction:  Recommendations per AVS

## 2023-03-27 NOTE — Assessment & Plan Note (Signed)
He has felt that his BP gets too low when taking metoprolol and imdur together.  Will have him take imdur in the am and toprol in the evening at half strength until seen by cardiology.

## 2023-03-27 NOTE — Assessment & Plan Note (Signed)
Denies new anginal symptoms.  Has f/u with cardiology next month.

## 2023-03-28 ENCOUNTER — Other Ambulatory Visit (HOSPITAL_BASED_OUTPATIENT_CLINIC_OR_DEPARTMENT_OTHER): Payer: Self-pay

## 2023-03-30 DIAGNOSIS — G4733 Obstructive sleep apnea (adult) (pediatric): Secondary | ICD-10-CM | POA: Diagnosis not present

## 2023-03-31 ENCOUNTER — Other Ambulatory Visit (HOSPITAL_BASED_OUTPATIENT_CLINIC_OR_DEPARTMENT_OTHER): Payer: Self-pay

## 2023-04-01 ENCOUNTER — Other Ambulatory Visit: Payer: Self-pay

## 2023-04-02 ENCOUNTER — Other Ambulatory Visit (HOSPITAL_BASED_OUTPATIENT_CLINIC_OR_DEPARTMENT_OTHER): Payer: Self-pay

## 2023-04-02 ENCOUNTER — Telehealth: Payer: Self-pay | Admitting: *Deleted

## 2023-04-02 ENCOUNTER — Other Ambulatory Visit: Payer: Self-pay

## 2023-04-02 NOTE — Telephone Encounter (Signed)
The patient has been notified of the result. Left detailed message on voicemail and informed patient to call back..,  Green, CMA   

## 2023-04-02 NOTE — Telephone Encounter (Signed)
-----   Message from Brandon [name] sent at 03/21/2023  1:19 PM EDT ----- Unsuccessful [employer] titration - please get patient back in office with [employer] rep for fine tuning of device

## 2023-04-03 ENCOUNTER — Telehealth: Payer: Self-pay

## 2023-04-03 NOTE — Telephone Encounter (Signed)
Called patient to schedule for follow up appt w/ [employer] rep, no answer. Left message with no identifiers asking recipient to call our office. Also sent email to [facility] rep asking about availability for early September.

## 2023-04-03 NOTE — Telephone Encounter (Signed)
-----   Message from [facility] Apolinar Junes J sent at 04/02/2023  5:28 PM EDT ----- I'll call the result to the patient if you can get the OV with the inspire rep. ----- Message ----- From: Quintella Reichert, MD Sent: 03/21/2023   1:19 PM EDT To: Cv Div Sleep Studies  Unsuccessful Inspire titration - please get patient back in office with [employer] rep for fine tuning of device

## 2023-04-09 ENCOUNTER — Other Ambulatory Visit (HOSPITAL_BASED_OUTPATIENT_CLINIC_OR_DEPARTMENT_OTHER): Payer: Self-pay

## 2023-04-09 ENCOUNTER — Other Ambulatory Visit: Payer: Self-pay

## 2023-04-09 DIAGNOSIS — G4734 Idiopathic sleep related nonobstructive alveolar hypoventilation: Secondary | ICD-10-CM | POA: Diagnosis not present

## 2023-04-10 ENCOUNTER — Other Ambulatory Visit (HOSPITAL_BASED_OUTPATIENT_CLINIC_OR_DEPARTMENT_OTHER): Payer: Self-pay

## 2023-04-10 ENCOUNTER — Encounter: Payer: Self-pay | Admitting: Student in an Organized Health Care Education/Training Program

## 2023-04-10 ENCOUNTER — Ambulatory Visit
Payer: 59 | Attending: Student in an Organized Health Care Education/Training Program | Admitting: Student in an Organized Health Care Education/Training Program

## 2023-04-10 VITALS — BP 111/73 | HR 102 | Temp 97.4°F | Resp 16 | Ht 68.0 in | Wt 194.0 lb

## 2023-04-10 DIAGNOSIS — Z9689 Presence of other specified functional implants: Secondary | ICD-10-CM | POA: Diagnosis not present

## 2023-04-10 DIAGNOSIS — G894 Chronic pain syndrome: Secondary | ICD-10-CM | POA: Diagnosis not present

## 2023-04-10 DIAGNOSIS — M961 Postlaminectomy syndrome, not elsewhere classified: Secondary | ICD-10-CM | POA: Insufficient documentation

## 2023-04-10 DIAGNOSIS — M792 Neuralgia and neuritis, unspecified: Secondary | ICD-10-CM | POA: Diagnosis not present

## 2023-04-10 DIAGNOSIS — M4802 Spinal stenosis, cervical region: Secondary | ICD-10-CM | POA: Diagnosis not present

## 2023-04-10 DIAGNOSIS — M5412 Radiculopathy, cervical region: Secondary | ICD-10-CM | POA: Insufficient documentation

## 2023-04-10 MED ORDER — BUPRENORPHINE 15 MCG/HR TD PTWK
1.0000 | MEDICATED_PATCH | TRANSDERMAL | 2 refills | Status: AC
Start: 2023-05-12 — End: 2023-08-04
  Filled 2023-04-10: qty 4, 28d supply, fill #0
  Filled 2023-05-19: qty 4, 28d supply, fill #1

## 2023-04-10 NOTE — Progress Notes (Signed)
PROVIDER NOTE: Information contained herein reflects review and annotations entered in association with encounter. Interpretation of such information and data should be left to medically-trained personnel. Information provided to patient can be located elsewhere in the medical record under "Patient Instructions". Document created using STT-dictation technology, any transcriptional errors that may result from process are unintentional.    Patient: Brandon [name]  Service Category: E/M  Provider: Edward Jolly, MD  DOB: 1955/11/03  DOS: 04/10/2023  Referring Provider: Everrett Coombe, DO  MRN: [id number]  Specialty: Interventional Pain Management  PCP: Everrett Coombe, DO  Type: Established Patient  Setting: Ambulatory outpatient    Location: Office  Delivery: Face-to-face     HPI  [name], a 67 y.o. year old male, is here today because of his Chronic pain syndrome [G89.4]. Brandon [name] primary complain today is Back Pain (low) Last encounter: My last encounter with him was on 01/09/23 Pertinent problems: Brandon [name] has Type 2 diabetes mellitus with other specified complication (HCC); Lumbar facet arthropathy; Coronary artery disease; Acute pain of left shoulder; Sacroiliac joint disease; Chronic pain syndrome; History of lumbar fusion (L4/5); Failed back surgical syndrome; and Pain management contract signed on their pertinent problem list. Pain Assessment: Severity of Chronic pain is reported as a 2 /10. Location: Back Lower/denies. Onset: More than a month ago. Quality: Constant, Sharp, Pressure. Timing: Constant. Modifying factor(s): SCS, medications,rest. Vitals:  height is 5\' 8"  (1.727 m) and weight is 194 lb (88 kg). His temporal temperature is 97.4 F (36.3 C) (abnormal). His blood pressure is 111/73 and his pulse is 102 (abnormal). His respiration is 16 and oxygen saturation is 95%.   Reason for encounter: medication management.   Continues to have right lumbar spine pain, history of  lumbar facet arthropathy and lumbar spine fusion, stable Continues with hypoglossal stimulator with ENT for obstructive sleep apnea management. He states it is helping and he is sleeping 7-8 hrs at night with uninterrupted sleep. Presents today for medication refill of Butrans.  He states that it is helpful in managing his pain.  No side effects    Pharmacotherapy Assessment  Analgesic: Butrans 15mcg/hr   Monitoring: [place] PMP: PDMP reviewed during this encounter.       Pharmacotherapy: No side-effects or adverse reactions reported. Compliance: No problems identified. Effectiveness: Clinically acceptable.  Rickey Barbara, RN  04/10/2023 10:35 AM  Signed Nursing Pain Medication Assessment:  Safety precautions to be maintained throughout the outpatient stay will include: orient to surroundings, keep bed in low position, maintain call bell within reach at all times, provide assistance with transfer out of bed and ambulation.  Medication Inspection Compliance: Pill count conducted under aseptic conditions, in front of the patient. Neither the pills nor the bottle was removed from the patient's sight at any time. Once count was completed pills were immediately returned to the patient in their original bottle.  Medication: Buprenorphine (Suboxone) Pill/Patch Count:  0 of 4 pills remain Pill/Patch Appearance: Markings consistent with prescribed medication Bottle Appearance: Standard pharmacy container. Clearly labeled. Filled Date: 07 / 15 / 2024 Last Medication intake:   patch change 04/07/2023  No results found for: "CBDTHCR" No results found for: "D8THCCBX" No results found for: "D9THCCBX"  UDS:  Summary  Date Value Ref Range Status  10/02/2021 Note  Final    Comment:    ==================================================================== Compliance Drug Analysis, Ur ==================================================================== Test  Result        Flag       Units  Drug Present and Declared for Prescription Verification   Buprenorphine                  8            EXPECTED   ng/mg creat   Norbuprenorphine               6            EXPECTED   ng/mg creat    Source of buprenorphine is a scheduled prescription medication.    Norbuprenorphine is an expected metabolite of buprenorphine.    Tizanidine                     PRESENT      EXPECTED   Bupropion                      PRESENT      EXPECTED   Hydroxybupropion               PRESENT      EXPECTED    Hydroxybupropion is an expected metabolite of bupropion.    Aripiprazole                   PRESENT      EXPECTED   Metoprolol                     PRESENT      EXPECTED  Drug Present not Declared for Prescription Verification   Acetaminophen                  PRESENT      UNEXPECTED  Drug Absent but Declared for Prescription Verification   Alpha-hydroxytriazolam         Not Detected UNEXPECTED ng/mg creat   Tramadol                       Not Detected UNEXPECTED ng/mg creat   Trazodone                      Not Detected UNEXPECTED   Salicylate                     Not Detected UNEXPECTED    Aspirin, as indicated in the declared medication list, is not always    detected even when used as directed.    Diphenhydramine                Not Detected UNEXPECTED   Lidocaine                      Not Detected UNEXPECTED    Lidocaine, as indicated in the declared medication list, is not    always detected even when used as directed.  ==================================================================== Test                      Result    Flag   Units      Ref Range   Creatinine              99               mg/dL      >=40 ==================================================================== Declared Medications:  The flagging and interpretation on this report are based on the  following declared medications.  Unexpected results may arise from  inaccuracies in the declared medications.    **Note: The testing scope of this panel includes these medications:   Aripiprazole (Abilify)  Bupropion (Wellbutrin)  Diphenhydramine  Metoprolol (Toprol)  Tramadol (Ultram)  Trazodone (Desyrel)  Triazolam (Halcion)   **Note: The testing scope of this panel does not include small to  moderate amounts of these reported medications:   Aspirin  Buprenorphine Patch (BuTrans)  Lidocaine  Tizanidine (Zanaflex)  Topical Lidocaine (Lidoderm)   **Note: The testing scope of this panel does not include the  following reported medications:   Amlodipine (Norvasc)  Atorvastatin (Lipitor)  Clopidogrel (Plavix)  Empagliflozin (Jardiance)  Insulin (Humalog)  Isosorbide (Imdur)  Losartan (Cozaar)  Metoclopramide (Reglan)  Nitroglycerin (Nitrostat)  Nystatin (Mycostatin)  Ondansetron (Zofran)  Pantoprazole (Protonix)  Prednisolone  Semaglutide (Ozempic)  Tamsulosin (Flomax)  Testosterone ==================================================================== For clinical consultation, please call [phone]. ====================================================================       ROS  Constitutional: Denies any fever or chills Gastrointestinal: No reported hemesis, hematochezia, vomiting, or acute GI distress Musculoskeletal:  +LBP Neurological: No reported episodes of acute onset apraxia, aphasia, dysarthria, agnosia, amnesia, paralysis, loss of coordination, or loss of consciousness  Medication Review  AMBULATORY NON FORMULARY MEDICATION, ARIPiprazole, Biotin, Dexcom G6 Sensor, Glucagon, Multi-Vitamin, Na Sulfate-K Sulfate-Mg Sulf, acetaminophen, albuterol, atorvastatin, b complex vitamins, buPROPion, buprenorphine, clopidogrel, docusate sodium, empagliflozin, ferrous sulfate, insulin lispro, isosorbide mononitrate, magnesium oxide, metoCLOPramide, metoprolol succinate, nitroGLYCERIN, ondansetron, pantoprazole, polyethylene glycol powder, tamsulosin, testosterone, tiZANidine,  and tirzepatide  History Review  Allergy: Brandon [name] is allergic to kiwi extract, other, penicillins, ancef [cefazolin], latex, levaquin [levofloxacin], and peach [prunus persica]. Drug: Brandon [name]  reports that he does not currently use drugs. Alcohol:  reports that he does not currently use alcohol. Tobacco:  reports that he quit smoking about 17 years ago. His smoking use included cigarettes. He started smoking about 47 years ago. He has a 30 pack-year smoking history. He has never used smokeless tobacco. Social: Brandon [name]  reports that he quit smoking about 17 years ago. His smoking use included cigarettes. He started smoking about 47 years ago. He has a 30 pack-year smoking history. He has never used smokeless tobacco. He reports that he does not currently use alcohol. He reports that he does not currently use drugs. Medical:  has a past medical history of Anxiety, Arthritis, Basal cell carcinoma (BCC) in situ of skin, Chronic back pain, Colon polyps, Coronary artery disease, Depression, Diabetes mellitus without complication (HCC), Fibromyalgia, Gallstones, GERD (gastroesophageal reflux disease), Hepatitis B, HLD (hyperlipidemia), Hypertension, Osteoarthritis, Pneumonia, PONV (postoperative nausea and vomiting), SBO (small bowel obstruction) (HCC), and Sleep apnea. Surgical: Brandon [name]  has a past surgical history that includes Spinal cord stimulator insertion; Cholecystectomy, laparoscopic; Cataract extraction w/ intraocular lens  implant, bilateral; Lumbar fusion; XI Robotic assisted inguinal hernia repair with mesh; LEFT HEART CATH AND CORONARY ANGIOGRAPHY (N/A, 08/15/2020); Coronary Ultrasound/IVUS (N/A, 08/15/2020); CORONARY STENT INTERVENTION (N/A, 08/15/2020); Cardiac catheterization (01/17/2021); LEFT HEART CATH AND CORONARY ANGIOGRAPHY (N/A, 01/17/2021); CORONARY STENT INTERVENTION (N/A, 01/17/2021); Microdiscectomy lumbar (2006); Spine surgery; Hernia repair; Drug induced endoscopy (N/A,  05/23/2022); Cholecystectomy; Implantation of hypoglossal nerve stimulator (Right, 07/30/2022); Colonoscopy with propofol (N/A, 12/19/2022); and polypectomy (12/19/2022). Family: family history includes Anxiety disorder in his father and mother; CAD in his brother; COPD in his mother; Cancer in his father; Colon cancer in his mother; Coronary artery disease in his father; Depression in his father and mother; Leukemia in his maternal grandmother; Multiple sclerosis in  his daughter.  Laboratory Chemistry Profile   Renal Lab Results  Component Value Date   BUN 7 (L) 03/27/2023   CREATININE 0.82 03/27/2023   BCR 9 (L) 03/27/2023   GFRAA >60 04/17/2020   GFRNONAA >60 07/13/2022    Hepatic Lab Results  Component Value Date   AST 20 03/27/2023   ALT 12 03/27/2023   ALBUMIN 4.5 03/27/2023   ALKPHOS 77 03/27/2023   LIPASE 37 07/12/2022    Electrolytes Lab Results  Component Value Date   NA 140 03/27/2023   K 3.6 03/27/2023   CL 100 03/27/2023   CALCIUM 9.2 03/27/2023   MG 1.9 07/13/2022   PHOS 3.2 07/13/2022    Bone Lab Results  Component Value Date   TESTOSTERONE 457 03/27/2023    Inflammation (CRP: Acute Phase) (ESR: Chronic Phase) Lab Results  Component Value Date   CRP 1.5 (H) 08/13/2020   ESRSEDRATE 17 (H) 08/13/2020   LATICACIDVEN 1.0 07/12/2022         Note: Above Lab results reviewed.  Recent Imaging Review  Sleep Study Documents Ordered by an unspecified provider. Note: Reviewed        Physical Exam  General appearance: Well nourished, well developed, and well hydrated. In no apparent acute distress Mental status: Alert, oriented x 3 (person, place, & time)       Respiratory: No evidence of acute respiratory distress Eyes: PERLA Vitals: BP 111/73   Pulse (!) 102   Temp (!) 97.4 F (36.3 C) (Temporal)   Resp 16   Ht 5\' 8"  (1.727 m)   Wt 194 lb (88 kg)   SpO2 95%   BMI 29.50 kg/m  BMI: Estimated body mass index is 29.5 kg/m as calculated from the  following:   Height as of this encounter: 5\' 8"  (1.727 m).   Weight as of this encounter: 194 lb (88 kg). Ideal: Ideal body weight: 68.4 kg (150 lb 12.7 oz) Adjusted ideal body weight: 76.2 kg (168 lb 1.2 oz)  Thoracic Spine Area Exam  Skin & Axial Inspection: Well healed scar from previous spine surgery detected Alignment: Symmetrical Functional ROM: Pain restricted ROM Stability: No instability detected Muscle Tone/Strength: Functionally intact. No obvious neuro-muscular anomalies detected. Sensory (Neurological): Neurogenic pain pattern Muscle strength & Tone: No palpable anomalies   Lumbar Spine Area Exam  Skin & Axial Inspection: Well healed scar from previous spine surgery detected Alignment: Symmetrical Functional ROM: Pain restricted ROM affecting both sides Stability: No instability detected Muscle Tone/Strength: Functionally intact. No obvious neuro-muscular anomalies detected. Sensory (Neurological): Dermatomal pain pattern + Lumbar facet extension positive for pain consistent with facet syndrome Palpation: IPG present     Lower Extremity Exam      Side: Right lower extremity   Side: Left lower extremity  Stability: No instability observed           Stability: No instability observed          Skin & Extremity Inspection: Skin color, temperature, and hair growth are WNL. No peripheral edema or cyanosis. No masses, redness, swelling, asymmetry, or associated skin lesions. No contractures.   Skin & Extremity Inspection: Skin color, temperature, and hair growth are WNL. No peripheral edema or cyanosis. No masses, redness, swelling, asymmetry, or associated skin lesions. No contractures.  Functional ROM: Pain restricted ROM for hip and knee joints           Functional ROM: Pain restricted ROM for hip and knee joints  Muscle Tone/Strength: Functionally intact. No obvious neuro-muscular anomalies detected.   Muscle Tone/Strength: Functionally intact. No obvious  neuro-muscular anomalies detected.  Sensory (Neurological): Arthropathic arthralgia         Sensory (Neurological): Arthropathic arthralgia        DTR: Patellar: deferred today Achilles: deferred today Plantar: deferred today   DTR: Patellar: deferred today Achilles: deferred today Plantar: deferred today  Palpation: No palpable anomalies   Palpation: No palpable anomalies       Assessment   Diagnosis Status  1. Chronic pain syndrome   2. Post laminectomy syndrome   3. Neuroforaminal stenosis of cervical spine   4. Cervical radicular pain (right C5/6)   5. Spinal cord stimulator status (Nevro)   6. Radicular pain in right arm      Controlled Controlled Controlled    Plan of Care  Problem-specific:  No problem-specific Assessment & Plan notes found for this encounter.  Brandon [name] has a current medication list which includes the following long-term medication(s): aripiprazole, atorvastatin, bupropion, bupropion, clopidogrel, dexcom g6 sensor, empagliflozin, insulin lispro, iron (ferrous sulfate), isosorbide mononitrate, metoclopramide, metoprolol succinate, pantoprazole, testosterone, and tizanidine.  Pharmacotherapy (Medications Ordered): Meds ordered this encounter  Medications   buprenorphine (BUTRANS) 15 MCG/HR    Sig: Place 1 patch onto the skin once a week.    Dispense:  4 patch    Refill:  2    Chronic Pain: STOP Act (Not applicable) Fill 1 day early if closed on refill date. Avoid benzodiazepines within 8 hours of opioids   Continue with Nevro SCS, APAP and Tizanidine PRN  Orders:  No orders of the defined types were placed in this encounter.  Follow-up plan:   Return in about 16 weeks (around 07/31/2023) for Medication Management, in person.    Recent Visits No visits were found meeting these conditions. Showing recent visits within past 90 days and meeting all other requirements Today's Visits Date Type Provider Dept  04/10/23 Office Visit  Edward Jolly, MD [facility]  Showing today's visits and meeting all other requirements Future Appointments No visits were found meeting these conditions. Showing future appointments within next 90 days and meeting all other requirements  I discussed the assessment and treatment plan with the patient. The patient was provided an opportunity to ask questions and all were answered. The patient agreed with the plan and demonstrated an understanding of the instructions.  Patient advised to call back or seek an in-person evaluation if the symptoms or condition worsens.  Duration of encounter: .  Total time on encounter, as per AMA guidelines included both the face-to-face and non-face-to-face time personally spent by the physician and/or other qualified health care professional(s) on the day of the encounter (includes time in activities that require the physician or other qualified health care professional and does not include time in activities normally performed by clinical staff). Physician's time may include the following activities when performed: preparing to see the patient (eg, review of tests, pre-charting review of records) obtaining and/or reviewing separately obtained history performing a medically appropriate examination and/or evaluation counseling and educating the patient/family/caregiver ordering medications, tests, or procedures referring and communicating with other health care professionals (when not separately reported) documenting clinical information in the electronic or other health record independently interpreting results (not separately reported) and communicating results to the patient/ family/caregiver care coordination (not separately reported)  Note by: Edward Jolly, MD Date: 04/10/2023; Time: 10:53 AM

## 2023-04-10 NOTE — Progress Notes (Signed)
Nursing Pain Medication Assessment:  Safety precautions to be maintained throughout the outpatient stay will include: orient to surroundings, keep bed in low position, maintain call bell within reach at all times, provide assistance with transfer out of bed and ambulation.  Medication Inspection Compliance: Pill count conducted under aseptic conditions, in front of the patient. Neither the pills nor the bottle was removed from the patient's sight at any time. Once count was completed pills were immediately returned to the patient in their original bottle.  Medication: Buprenorphine (Suboxone) Pill/Patch Count:  0 of 4 pills remain Pill/Patch Appearance: Markings consistent with prescribed medication Bottle Appearance: Standard pharmacy container. Clearly labeled. Filled Date: 07 / 15 / 2024 Last Medication intake:   patch change 04/07/2023

## 2023-04-11 ENCOUNTER — Other Ambulatory Visit (HOSPITAL_BASED_OUTPATIENT_CLINIC_OR_DEPARTMENT_OTHER): Payer: Self-pay

## 2023-04-14 ENCOUNTER — Other Ambulatory Visit (HOSPITAL_BASED_OUTPATIENT_CLINIC_OR_DEPARTMENT_OTHER): Payer: Self-pay

## 2023-04-14 NOTE — Progress Notes (Deleted)
Cardiology Office Note:  .   Date:  04/14/2023  ID:  Doy [name], DOB Jan 11, 1956, MRN [id number] PCP: Brandon Coombe, DO  [facility] Providers Cardiologist:  Brandon Sprague, MD Sleep Medicine:  Brandon Magic, MD { Click to update primary MD,subspecialty MD or APP then REFRESH:1}   History of Present Illness: .   Brandon [name] is a 67 y.o. male with past medical history of CAD s/p PCI to the RCA and LCX in 2021, HTN, HLD, DM type 2, and OSA.   In 07/2020 he presented to the emergency room with chest pain, troponins were negative x 2.  TTE showed EF 60 to 65%, no significant valve issue.  He underwent coronary CT on 07/2020 which showed coronary calcium score of 1201, 95th percentile for age and sex matched control, diffuse CAD with suspicion for severe stenosis in the mid RCA, ostial D2 and proximal left circumflex artery.  He underwent cardiac catheterization on 08/15/2020 which showed 90% mid RCA lesion, 75% proximal left circumflex artery, 75% OM 2, 100% OM1 with faint left to right and right to left collaterals, 70% RPDA, 75% D2, 30% distal LAD lesion.  The 90% mid RCA lesion was treated successfully with DES.  The small disease in RPDA, OM1, OM 2 the diagonal vessel was managed medically.   On office visit in 12/04/2020 he was noted as having significant DOE.  He failed antianginal medication up titration resulting in LHC on 01/17/2021 which revealed severe proximal left circumflex disease stenosis. He received PCI with DES to prox LCX and mid LCX.   He was last seen in office on 02/07/23 by Dr. Mayford [name] for OSA follow up. He was stable from a cardiac perspective at that time.   CAD: S/p PCI to RCA in 2021 and PCI x2 to LCX in 2022.  Angina? Previously unable to tolerate losartan due to lightheadedness.  Continue Plavix, Lipitor, metoprolol, Imdur.  HTN: Blood pressure today  Continue metoprolol and imdur   Type 2 DM: Last A1C was 6.7 on 03/27/23. Monitored and managed by  PCP.   HLD: Last lipid profile on 03/27/23 indicates total cholesterol of 129, triglycerides 259 and LDL 52.   OSA:  Followed by Dr. Mayford [name]  Previously did not tolerate BiPAP device  He had Inspire device 07/30/2022 with Dr. Annalee [name]  ROS: ****** denies chest pain, shortness of breath, lower extremity edema, fatigue, palpitations, melena, hematuria, hemoptysis, diaphoresis, weakness, presyncope, syncope, orthopnea, and PND.   Studies Reviewed: .       Cardiac Studies & Procedures   CARDIAC CATHETERIZATION  CARDIAC CATHETERIZATION 01/17/2021  Narrative  2nd Diag-2 lesion is 75% stenosed.  2nd Diag-1 lesion is 75% stenosed.  Dist LAD lesion is 50% stenosed.  2nd Mrg lesion is 75% stenosed.  1st Mrg lesion is 100% stenosed.  RPDA lesion is 70% stenosed.  Previously placed Mid RCA stent (unknown type) is widely patent.  Dist RCA lesion is 50% stenosed.  Ost RCA to Prox RCA lesion is 30% stenosed.  Mid Cx lesion is 90% stenosed.  Ost Cx to Prox Cx lesion is 75% stenosed.  A drug-eluting stent was successfully placed using a STENT RESOLUTE ONYX 3.5X15.  Post intervention, there is a 0% residual stenosis.  A drug-eluting stent was successfully placed using a STENT RESOLUTE ONYX 2.25X8.  Post intervention, there is a 0% residual stenosis.  1. Non-obstructive disease in the LAD. Diffuse disease in the small to moderate caliber Diagonal branch, unchanged from last cath  and not a good target for PCI. 2. Severe proximal Circumflex stenosis. Severe mid Circumflex stenosis. Chronic occlusion OM1. Severe diffuse disease in the small caliber second OM branch. 4. Patent proximal to mid RCA stent. Severe disease in the small caliber PDA, unchanged from last cath. 5. Successful PTCA/DES x 1 proximal Circumflex 6. Successful PTCA/DES x 1 mid Circumflex  Recommendations: Continue ASA/Plavix for at least one year.  Findings Coronary Findings Diagnostic  Dominance: Right  Left  Anterior Descending Dist LAD lesion is 50% stenosed.  First Diagonal Branch Vessel is moderate in size.  Second Diagonal Branch 2nd Diag-1 lesion is 75% stenosed. 2nd Diag-2 lesion is 75% stenosed.  Left Circumflex Ost Cx to Prox Cx lesion is 75% stenosed. Mid Cx lesion is 90% stenosed.  First Obtuse Marginal Branch Collaterals 1st Mrg filled by collaterals from 3rd RPL.  1st Mrg lesion is 100% stenosed.  Second Obtuse Marginal Branch 2nd Mrg lesion is 75% stenosed.  Right Coronary Artery Ost RCA to Prox RCA lesion is 30% stenosed. Previously placed Mid RCA stent (unknown type) is widely patent. Dist RCA lesion is 50% stenosed.  Right Posterior Descending Artery RPDA lesion is 70% stenosed.  Intervention  Ost Cx to Prox Cx lesion Stent Pre-stent angioplasty was performed using a BALLOON SAPPHIRE 2.0X12. A drug-eluting stent was successfully placed using a STENT RESOLUTE ONYX 3.5X15. Stent strut is well apposed. Post-stent angioplasty was performed using a BALLOON SAPPHIRE [place] 3.5X12. Post-Intervention Lesion Assessment The intervention was successful. Pre-interventional TIMI flow is 3. Post-intervention TIMI flow is 3. No complications occurred at this lesion. There is a 0% residual stenosis post intervention.  Mid Cx lesion Stent Pre-stent angioplasty was performed using a BALLOON SAPPHIRE 2.0X12. A drug-eluting stent was successfully placed using a STENT RESOLUTE ONYX 2.25X8. Stent strut is well apposed. Post-stent angioplasty was performed using a BALLOON SAPPHIRE [place] 3.5X12. Post-Intervention Lesion Assessment The intervention was successful. Pre-interventional TIMI flow is 3. Post-intervention TIMI flow is 3. No complications occurred at this lesion. There is a 0% residual stenosis post intervention.   CARDIAC CATHETERIZATION  CARDIAC CATHETERIZATION 08/15/2020  Narrative  Mid RCA lesion is 90% stenosed.  Prox Cx lesion is 75% stenosed.  2nd Mrg lesion is  75% stenosed.  1st Mrg lesion is 100% stenosed. Faint right to left and left to left collaterals.  RPDA lesion is 70% stenosed.  2nd Diag-1 lesion is 75% stenosed.  2nd Diag-2 lesion is 75% stenosed.  Dist LAD lesion is 50% stenosed.  A drug-eluting stent was successfully placed using a STENT RESOLUTE ONYX 4.0X38, optimized with IVUS.  Post intervention, there is a 0% residual stenosis.  LV end diastolic pressure is normal.  There is no aortic valve stenosis.  75 cm long sheath needed for support due to tortuosity in the right subclavian to engage the RCA. Left coronary could be engaged easily with short sheath.  Diffuse multivessel disease, but no LAD involvement.  RCA successfully treated.  Small vessel disease noted in the RPDA, OM1 and OM2, second diagonal and apical LAD.  Proximal circumflex would be a potential target for PCI if he had more symptoms.  Intensify medical therapy.  Findings Coronary Findings Diagnostic  Dominance: Right  Left Anterior Descending Dist LAD lesion is 50% stenosed.  First Diagonal Branch Vessel is moderate in size.  Second Diagonal Branch 2nd Diag-1 lesion is 75% stenosed. 2nd Diag-2 lesion is 75% stenosed.  Left Circumflex Prox Cx lesion is 75% stenosed.  First Obtuse Marginal Branch Collaterals 1st  Mrg filled by collaterals from 3rd RPL.  1st Mrg lesion is 100% stenosed.  Second Obtuse Marginal Branch 2nd Mrg lesion is 75% stenosed.  Right Coronary Artery Mid RCA lesion is 90% stenosed. Vessel is the culprit lesion. The lesion is concentric. The lesion is moderately calcified. Ultrasound (IVUS) was performed. Severe plaque burden was detected. IVUS has determined that the lesion is calcified and concentric. Two short areas of severe focal calcification.  Right Posterior Descending Artery RPDA lesion is 70% stenosed.  Intervention  Mid RCA lesion Stent CATH VISTA GUIDE 6FR XBRCA guide catheter was inserted. Lesion  crossed with guidewire using a WIRE ASAHI PROWATER 180CM. Pre-stent angioplasty was performed using a BALLOON WOLVERINE 3.00X15. A drug-eluting stent was successfully placed using a STENT RESOLUTE ONYX 4.0X38. Stent strut is well apposed. Post-stent angioplasty was performed using a BALLOON SAPPHIRE K[place] 4.0X15. 75 cm long sheath needed for support due to tortuosity in the right subclavian to engage the RCA.  Left coronary could be engaged easily with short sheath. Post-Intervention Lesion Assessment The intervention was successful. Pre-interventional TIMI flow is 3. Post-intervention TIMI flow is 3. No complications occurred at this lesion. There is a 0% residual stenosis post intervention.     ECHOCARDIOGRAM  ECHOCARDIOGRAM COMPLETE 11/01/2021  Narrative ECHOCARDIOGRAM REPORT    Patient Name:   Brandon [name] Date of Exam: 11/01/2021 Medical Rec #:  [id number]      Height:       69.0 in Accession #:    [id number]     Weight:       197.0 lb Date of Birth:  1956/06/30      BSA:          2.053 m Patient Age:    66 years       BP:           122/61 mmHg Patient Gender: M              HR:           72 bpm. Exam Location:  Inpatient  Procedure: 2D Echo, Cardiac Doppler and Color Doppler  Indications:    TIA  History:        Patient has prior history of Echocardiogram examinations, most recent 08/13/2020. CAD; Risk Factors:Former Smoker, Hypertension, Diabetes and Dyslipidemia. S/P PCI.  Sonographer:    Ross Ludwig RDCS (AE) Referring Phys: [id number] CHING T TU  IMPRESSIONS   1. Left ventricular ejection fraction, by estimation, is 60 to 65%. The left ventricle has normal function. The left ventricle has no regional wall motion abnormalities. There is moderate concentric left ventricular hypertrophy. Left ventricular diastolic parameters are consistent with Grade I diastolic dysfunction (impaired relaxation). 2. Right ventricular systolic function is normal. The right ventricular size is  normal. Tricuspid regurgitation signal is inadequate for assessing PA pressure. 3. The mitral valve is normal in structure. No evidence of mitral valve regurgitation. No evidence of mitral stenosis. 4. The aortic valve is tricuspid. Aortic valve regurgitation is not visualized. 5. The inferior vena cava is normal in size with greater than 50% respiratory variability, suggesting right atrial pressure of 3 mmHg.  Comparison(s): No significant change from prior study.  FINDINGS Left Ventricle: Left ventricular ejection fraction, by estimation, is 60 to 65%. The left ventricle has normal function. The left ventricle has no regional wall motion abnormalities. The left ventricular internal cavity size was normal in size. There is moderate concentric left ventricular hypertrophy. Left ventricular diastolic parameters are consistent with Grade  I diastolic dysfunction (impaired relaxation).  Right Ventricle: The right ventricular size is normal. No increase in right ventricular wall thickness. Right ventricular systolic function is normal. Tricuspid regurgitation signal is inadequate for assessing PA pressure.  Left Atrium: Left atrial size was normal in size.  Right Atrium: Right atrial size was normal in size.  Pericardium: There is no evidence of pericardial effusion.  Mitral Valve: The mitral valve is normal in structure. No evidence of mitral valve regurgitation. No evidence of mitral valve stenosis. MV peak gradient, 4.2 mmHg. The mean mitral valve gradient is 2.0 mmHg.  Tricuspid Valve: The tricuspid valve is normal in structure. Tricuspid valve regurgitation is not demonstrated. No evidence of tricuspid stenosis.  Aortic Valve: The aortic valve is tricuspid. Aortic valve regurgitation is not visualized. Aortic valve mean gradient measures 3.0 mmHg. Aortic valve peak gradient measures 5.7 mmHg. Aortic valve area, by VTI measures 3.64 cm.  Pulmonic Valve: The pulmonic valve was not well  visualized. Pulmonic valve regurgitation is not visualized. No evidence of pulmonic stenosis.  Aorta: The aortic root and ascending aorta are structurally normal, with no evidence of dilitation.  Venous: The inferior vena cava is normal in size with greater than 50% respiratory variability, suggesting right atrial pressure of 3 mmHg.  IAS/Shunts: No atrial level shunt detected by color flow Doppler.   LEFT VENTRICLE PLAX 2D LVIDd:         4.30 cm   Diastology LVIDs:         2.90 cm   LV e' medial:    7.18 cm/s LV PW:         1.40 cm   LV E/e' medial:  11.4 LV IVS:        1.30 cm   LV e' lateral:   13.10 cm/s LVOT diam:     2.30 cm   LV E/e' lateral: 6.2 LV SV:         83 LV SV Index:   40 LVOT Area:     4.15 cm   RIGHT VENTRICLE             IVC RV Basal diam:  2.70 cm     IVC diam: 2.00 cm RV S prime:     16.30 cm/s TAPSE (M-mode): 2.5 cm  LEFT ATRIUM             Index        RIGHT ATRIUM           Index LA diam:        3.80 cm 1.85 cm/m   RA Area:     16.40 cm LA Vol (A2C):   48.8 ml 23.77 ml/m  RA Volume:   43.70 ml  21.29 ml/m LA Vol (A4C):   39.7 ml 19.34 ml/m LA Biplane Vol: 45.4 ml 22.12 ml/m AORTIC VALVE AV Area (Vmax):    3.84 cm AV Area (Vmean):   3.60 cm AV Area (VTI):     3.64 cm AV Vmax:           119.00 cm/s AV Vmean:          79.900 cm/s AV VTI:            0.227 m AV Peak Grad:      5.7 mmHg AV Mean Grad:      3.0 mmHg LVOT Vmax:         110.00 cm/s LVOT Vmean:        69.200 cm/s LVOT VTI:  0.199 m LVOT/AV VTI ratio: 0.88  AORTA Ao Root diam: 3.60 cm Ao Asc diam:  3.30 cm  MITRAL VALVE MV Area (PHT): 2.26 cm    SHUNTS MV Area VTI:   2.83 cm    Systemic VTI:  0.20 m MV Peak grad:  4.2 mmHg    Systemic Diam: 2.30 cm MV Mean grad:  2.0 mmHg MV Vmax:       1.02 m/s MV Vmean:      61.2 cm/s MV Decel Time: 335 msec MV E velocity: 81.80 cm/s MV A velocity: 67.60 cm/s MV E/A ratio:  1.21  Riley Lam MD Electronically  signed by Riley Lam MD Signature Date/Time: 11/01/2021/1:52:02 PM    Final     CT SCANS  CT CORONARY MORPH W/CTA COR W/SCORE 08/14/2020  Addendum 08/14/2020  2:22 PM ADDENDUM REPORT: 08/14/2020 14:20  CLINICAL DATA:  67 year old male with h/o DM, hypertension, hyperlipidemia, former smoker who presented with chest pain.  EXAM: Cardiac/Coronary  CTA  TECHNIQUE: The patient was scanned on a [employer].  FINDINGS: A 100 kV prospective scan was triggered in the descending thoracic aorta at 111 HU's. Axial non-contrast 3 mm slices were carried out through the heart. The data set was analyzed on a dedicated work station and scored using the Agatson method. Gantry rotation speed was 250 msecs and collimation was .6 mm. 100 mg of PO Metoprolol and 0.8 mg of sl NTG were given. The 3D data set was reconstructed in 5% intervals of the 67-82 % of the R-R cycle. Diastolic phases were analyzed on a dedicated work station using MPR, MIP and VRT modes. The patient received 80 cc of contrast.  Aorta: Normal size. Mild diffuse atherosclerotic plaque and calcifications. No dissection.  Aortic Valve:  Trileaflet.  No calcifications.  Coronary Arteries:  Normal coronary origin.  Right dominance.  RCA is a large dominant artery that gives rise to PDA and PLA. There is mild calcified plaque in the proximal RCA with stenosis 25-49%. Mid RCA has severe diffuse predominantly calcified plaque with a focal stenosis suspicious for > 70%. Distal RCA has moderate calcified plaque prior to bifurcation into PLA and PDA with stenosis 50-69%.  PLA is small with diffuse moderate plaque.  PDA is a medium caliber artery with diffuse moderate plaque in a bead like pattern.  Left main is a large artery that gives rise to LAD and LCX arteries. Left main is a long artery with minimal calcified plaque and stenosis 0-25%.  LAD is a large vessel that gives rise to two diagonal  arteries. There is mild diffuse calcified plaque with focal stenoses in the proximal and mid LAD of 25-49%.  D1 is very small.  D2 is a medium caliber artery (2.1 mm) that has severe mixed ostial plaque with lipid rich core and is suspicious for stenosis > 70%.  LCX is a large caliber non-dominant artery that gives rise to two OM branches. Proximal LCX artery has severe non-calcified plaque suspicious for stenosis > 70%, this is followed by a moderate diffuse calcified plaque with stenosis 50-69%. Mid and distal LCX artery has small lumen and mild diffuse plaque.  OM1 is very small and has severe diffuse plaque.  OM2 is a medium caliber artery with diffuse moderate plaque in a bead like pattern.  Other findings:  Normal pulmonary vein drainage into the left atrium.  Normal left atrial appendage without a thrombus.  Normal size of the pulmonary artery.  IMPRESSION: 1. Coronary calcium score  of 1201. This was 95 percentile for age and sex matched control.  2. Normal coronary origin with right dominance.  3. CAD-RADS 4 Severe stenosis. (70-99%). Diffuse CAD in a bead-like diabetic pattern, there is suspicion for severe stenoses in the mid RCA, ostial portion of a large D2 and proximal portion of LCX artery. Additional analysis with CT FFR will be submitted. We will tentatively plan for a left cardiac catheterization for tomorrow.   Electronically Signed By: Tobias Alexander On: 08/14/2020 14:20  Narrative EXAM: OVER-READ INTERPRETATION  CT CHEST  The following report is an over-read performed by radiologist Dr. Genevive Bi of [facility] on 08/14/2020. This over-read does not include interpretation of cardiac or coronary anatomy or pathology. The coronary CTA interpretation by the cardiologist is attached.  COMPARISON:  CT a 08/11/2020  FINDINGS: Limited view of the lung parenchyma demonstrates mild LEFT basilar atelectasis pleural thickening  with calcifications. Airways are normal.  Limited view of the mediastinum demonstrates no adenopathy. Esophagus normal.  Limited view of the upper abdomen unremarkable.  Limited view of the skeleton and chest wall is unremarkable.  IMPRESSION: Pleural thickening and calcifications at the LEFT lung base. No change from prior  Electronically Signed: By: Genevive Bi M.D. On: 08/14/2020 12:05          *** Risk Assessment/Calculations:   {Does this patient have ATRIAL FIBRILLATION?:[phone]} No BP recorded.  {Refresh Note OR Click here to enter BP  :1}***       Physical Exam:   VS:  There were no vitals taken for this visit.   Wt Readings from Last 3 Encounters:  04/10/23 194 lb (88 kg)  03/27/23 203 lb (92.1 kg)  03/18/23 191 lb (86.6 kg)    GEN: Well nourished, well developed in no acute distress NECK: No JVD; No carotid bruits CARDIAC: ***RRR, no murmurs, rubs, gallops RESPIRATORY:  Clear to auscultation without rales, wheezing or rhonchi  ABDOMEN: Soft, non-tender, non-distended EXTREMITIES:  No edema; No deformity   ASSESSMENT AND PLAN: .   ***    {Are you ordering a CV Procedure (e.g. stress test, cath, DCCV, TEE, etc)?   Press F2        :[id number]}  Dispo: ***  Signed, Rip Harbour, NP

## 2023-04-15 ENCOUNTER — Other Ambulatory Visit (HOSPITAL_BASED_OUTPATIENT_CLINIC_OR_DEPARTMENT_OTHER): Payer: Self-pay

## 2023-04-15 ENCOUNTER — Encounter: Payer: Self-pay | Admitting: Cardiology

## 2023-04-15 ENCOUNTER — Ambulatory Visit: Payer: 59 | Attending: Cardiology | Admitting: Physician Assistant

## 2023-04-15 VITALS — BP 109/71 | HR 78 | Ht 68.0 in | Wt 205.8 lb

## 2023-04-15 DIAGNOSIS — I10 Essential (primary) hypertension: Secondary | ICD-10-CM

## 2023-04-15 DIAGNOSIS — E782 Mixed hyperlipidemia: Secondary | ICD-10-CM

## 2023-04-15 DIAGNOSIS — I25119 Atherosclerotic heart disease of native coronary artery with unspecified angina pectoris: Secondary | ICD-10-CM | POA: Diagnosis not present

## 2023-04-15 DIAGNOSIS — G4733 Obstructive sleep apnea (adult) (pediatric): Secondary | ICD-10-CM | POA: Diagnosis not present

## 2023-04-15 DIAGNOSIS — I2510 Atherosclerotic heart disease of native coronary artery without angina pectoris: Secondary | ICD-10-CM

## 2023-04-15 DIAGNOSIS — E119 Type 2 diabetes mellitus without complications: Secondary | ICD-10-CM

## 2023-04-15 MED ORDER — ISOSORBIDE MONONITRATE ER 30 MG PO TB24
15.0000 mg | ORAL_TABLET | Freq: Every day | ORAL | 3 refills | Status: DC
  Filled 2023-04-15 – 2023-04-16 (×2): qty 45, 90d supply, fill #0

## 2023-04-15 NOTE — Assessment & Plan Note (Signed)
History of DES to the mid RCA in 2021 and DES x2 to LCX in 2022. Diffuse disease in small to moderate diagonal, completely occluded OM1, and severe disease in small OM2 and PDA with no targets for PCI. These are managed medically. He is doing well with no recent angina symptoms reported. -Continue Plavix 75mg  daily, Atorvastatin 80mg  daily, and Nitroglycerin as needed. -DC Metoprolol  -Reduce Imdur to 15mg  daily -Follow up 6 mos

## 2023-04-15 NOTE — Progress Notes (Signed)
Cardiology Office Note:    Date:  04/15/2023  ID:  FOLEY ARWOOD, DOB [birth year], MRN [id number] PCP: Everrett Coombe, DO  [facility] Providers Cardiologist:  Meriam Sprague, MD Sleep Medicine:  Armanda Magic, MD       Patient Profile:      Coronary artery disease S/p DES to [facility] in 12/21 Residual small vessel dz in RPDA, OM1, OM2, D1 and apical LAD; pLCx 75 (target for PCI if +Symptoms) Chest pain post PCI >> tx briefly with [place] but later [facility] [place] due to no symptoms S/p 3.5 x 15 mm and 2.25 x 8 mm DES to LCx x 2 in 12/2020 mRCA stent patent; diff dz in small to mod Dx (not a good target for PCI); OM1 100 CTO; severe dz in small OM2 and PDA >> Med Rx TTE 08/13/20: EF 60-65, no RWMA, mild LVH, Gr 1 DD  TTE 11/01/21: EF 60-65, no RWMA, GR 1 DD, normal RVSF, RAP 3 Hypertension Hyperlipidemia Diabetes mellitus OSA s/p Inspire Obesity            Discussed the use of AI scribe software for clinical note transcription with the patient, who gave verbal consent to proceed.  History of Present Illness   The patient, a 67 year old male who returns for follow up of coronary artery disease, hyperlipidemia. The patient reports recent issues with low blood pressure in the mornings, with readings as low as 80/64. This has led to the patient discontinuing Imdur and metoprolol due to concerns about driving and working with such low blood pressure. He notes that his blood pressure tends to increase throughout the day, reaching 140-150/90. He reports no chest pain, shortness of breath, no swelling, and no difficulty breathing when lying flat. He also reports that his sleep apnea is well managed with the [facility] device.      ROS:  See HPI    Studies Reviewed:   EKG Interpretation Date/Time:  Tuesday April 15 2023 09:16:52 EDT Ventricular Rate:  78 PR Interval:  172 QRS Duration:  90 QT Interval:  378 QTC Calculation: 430 R Axis:   5  Text Interpretation: Normal sinus rhythm Non  specific ST-TW changes No change when compared to last tracing in 01/2022 Confirmed by Tereso Newcomer [phone]) on 04/15/2023 10:19:02 AM   Risk Assessment/Calculations:             Physical Exam:   VS:  BP 109/71   Pulse 78   Ht 5\' 8"  (1.727 m)   Wt 205 lb 12.8 oz (93.4 kg)   SpO2 94%   BMI 31.29 kg/m    Wt Readings from Last 3 Encounters:  04/15/23 205 lb 12.8 oz (93.4 kg)  04/10/23 194 lb (88 kg)  03/27/23 203 lb (92.1 kg)    Constitutional:      Appearance: Healthy appearance. Not in distress.  Neck:     Vascular: JVD normal.  Pulmonary:     Breath sounds: Normal breath sounds. No wheezing. No rales.  Cardiovascular:     Normal rate. Regular rhythm.     Murmurs: There is no murmur.  Edema:    Peripheral edema absent.  Abdominal:     Palpations: Abdomen is soft.        Assessment and Plan:  Coronary artery disease History of DES to the mid RCA in 2021 and DES x2 to LCX in 2022. Diffuse disease in small to moderate diagonal, completely occluded OM1, and severe disease in small OM2 and PDA with  no targets for PCI. These are managed medically. He is doing well with no recent angina symptoms reported. -Continue Plavix 75mg  daily, Atorvastatin 80mg  daily, and Nitroglycerin as needed. -DC Metoprolol  -Reduce Imdur to 15mg  daily -Follow up 6 mos   Primary hypertension Reports of low blood pressure in the mornings, leading to discontinuation of Imdur and Metoprolol. Blood pressure increases throughout the day. - Monitor blood pressure regularly, particularly in the mornings. - Resume Imdur at 15 mg once daily. - DC Metoprolol succinate   Mixed hyperlipidemia Currently managed with Atorvastatin 80mg  daily. Recent LDL level optimal at 52. -Continue Atorvastatin 80mg  daily.     Dispo:  Return in about 6 months (around 10/16/2023) for Routine Follow Up, w/ Dr. Lynnette Caffey.  Signed, Tereso Newcomer, PA-C

## 2023-04-15 NOTE — Patient Instructions (Signed)
Medication Instructions:  Your physician has recommended you make the following change in your medication:   STOP Metoprolol  REDUCE Isosorbide to 30 taking only 1/2 tablet daily  *If you need a refill on your cardiac medications before your next appointment, please call your pharmacy*   Lab Work: None ordered  If you have labs (blood work) drawn today and your tests are completely normal, you will receive your results only by: MyChart Message (if you have MyChart) OR A paper copy in the mail If you have any lab test that is abnormal or we need to change your treatment, we will call you to review the results.   Testing/Procedures: None ordered   Follow-Up: At [facility], you and your health needs are our priority.  As part of our continuing mission to provide you with exceptional heart care, we have created designated Provider Care Teams.  These Care Teams include your primary Cardiologist (physician) and Advanced Practice Providers (APPs -  Physician Assistants and Nurse Practitioners) who all work together to provide you with the care you need, when you need it.  We recommend signing up for the patient portal called "MyChart".  Sign up information is provided on this After Visit Summary.  MyChart is used to connect with patients for Virtual Visits (Telemedicine).  Patients are able to view lab/test results, encounter notes, upcoming appointments, etc.  Non-urgent messages can be sent to your provider as well.   To learn more about what you can do with MyChart, go to [email or web address].    Your next appointment:   6 month(s)  Provider:   Alverda Skeans, MD     Other Instructions

## 2023-04-15 NOTE — Assessment & Plan Note (Signed)
Currently managed with Atorvastatin 80mg  daily. Recent LDL level optimal at 52. -Continue Atorvastatin 80mg  daily.

## 2023-04-15 NOTE — Assessment & Plan Note (Signed)
Reports of low blood pressure in the mornings, leading to discontinuation of Imdur and Metoprolol. Blood pressure increases throughout the day. - Monitor blood pressure regularly, particularly in the mornings. - Resume Imdur at 15 mg once daily. - DC Metoprolol succinate

## 2023-04-16 ENCOUNTER — Other Ambulatory Visit (HOSPITAL_BASED_OUTPATIENT_CLINIC_OR_DEPARTMENT_OTHER): Payer: Self-pay

## 2023-04-30 DIAGNOSIS — G4733 Obstructive sleep apnea (adult) (pediatric): Secondary | ICD-10-CM | POA: Diagnosis not present

## 2023-05-05 ENCOUNTER — Other Ambulatory Visit (HOSPITAL_BASED_OUTPATIENT_CLINIC_OR_DEPARTMENT_OTHER): Payer: Self-pay

## 2023-05-07 ENCOUNTER — Encounter: Payer: Self-pay | Admitting: Cardiology

## 2023-05-07 ENCOUNTER — Ambulatory Visit: Payer: 59 | Admitting: Cardiology

## 2023-05-08 ENCOUNTER — Ambulatory Visit: Payer: 59 | Attending: Cardiology | Admitting: Cardiology

## 2023-05-08 ENCOUNTER — Encounter: Payer: Self-pay | Admitting: Cardiology

## 2023-05-08 VITALS — BP 126/64 | HR 84 | Ht 68.0 in | Wt 198.8 lb

## 2023-05-08 DIAGNOSIS — I10 Essential (primary) hypertension: Secondary | ICD-10-CM | POA: Diagnosis not present

## 2023-05-08 DIAGNOSIS — G4733 Obstructive sleep apnea (adult) (pediatric): Secondary | ICD-10-CM

## 2023-05-08 NOTE — Patient Instructions (Signed)
Medication Instructions:  Your physician recommends that you continue on your current medications as directed. Please refer to the Current Medication list given to you today.  *If you need a refill on your cardiac medications before your next appointment, please call your pharmacy*   Lab Work: None.  If you have labs (blood work) drawn today and your tests are completely normal, you will receive your results only by: MyChart Message (if you have MyChart) OR A paper copy in the mail If you have any lab test that is abnormal or we need to change your treatment, we will call you to review the results.   Testing/Procedures: None.   Follow-Up:    Your next appointment will be Thursday, 06/19/23 at 10:00 AM with Dr. Mayford Knife and Earnest Bailey Rep:     Provider:   Dr. Armanda Magic, MD

## 2023-05-08 NOTE — Progress Notes (Signed)
Date:  05/08/2023   ID:  Brandon [name], DOB 1955/10/28, MRN [id number] The patient was identified using 2 identifiers. PCP:  Everrett Coombe, DO   [facility] Providers Cardiologist:  Orbie Pyo, MD Cardiology APP:  Beatrice Lecher, PA-C  Sleep Medicine:  Armanda Magic, MD     Evaluation Performed:  Follow-Up Visit Chief Complaint:  OSA  History of Present Illness:    Brandon [name] is a 67 y.o. male with  a hx of CAD, hypertension, diabetes who was referred for sleep study testing by Dr. Shari Prows.  He has a history of obstructive sleep apnea the past but stopped using his CPAP and was referred back for repeat sleep study to get back on CPAP therapy.  He underwent split-night sleep study in 06-30-22which revealed severe obstructive sleep apnea with an AHI of 45.7/h and O2 saturations as low as 54% consistent with nocturnal hypoxemia.  He failed CPAP therapy due to ongoing events and underwent BiPAP titration and was placed on auto BiPAP with IPAP max 20 cm H2O, EPAP min 5 cm H2O and pressure support 4 cm H2O   He really struggled with auto BiPAP device.  He was having problems waking up about 4 hours after starting to sleep because his mask was leaking and he would take it off.  He was having significant problems with the mask really leaking and so we ordered him new PAP supplies including a new nasal cushion with chin strap as he had not changed his cushion out since he got his device.     He was referred to ENT and ultimately underwent Inspire device implant 07/30/22 by Dr. Annalee Genta.  He was seen 09/04/2022 and his inspire device was activated.  His amplitude was set at 1.2 V with a control of 1.2 to 2.2 V.   Hide office visit 02/13/2023 he was not feeling rested when he would get it up in the am and then would get sleepy during the day as well.  He was  on O2 at 3L in addition to his Inspire.  He also was having a little bit of tongue soreness at the base of his tongue when he  would wake up but when away by lunch.    He underwent inspire titration in the sleep lab on 03/18/2023  but unfortunately an optimal inspire amplitude could not be obtained due to ongoing respiratory events and severe oxygen desaturations as low as 72% with moderate snoring.  He has now back in the sleep clinic for fine-tuning of his device with the inspire rep.  He feels he is doing well with his device.  He has no known tongue soreness or sore throat.  He says his wife no longer says he snores but she does note that he wakes her up gasping for breath some at night.  He is predominantly sleeping on his back.  In reviewing his inspire titration in the sleep lab he did not really have any central events they were all obstructive events with nocturnal hypoxemia.  O2 sats did not get above 88%.  He was not using his oxygen on the night of the study.  He does use 3 L oxygen at home at night.  Past Medical History:  Diagnosis Date   Anxiety    Arthritis    Basal cell carcinoma (BCC) in situ of skin    left neck   Chronic back pain    has spinal cord  stimulator and wears buprenorphine patch   Colon polyps    Coronary artery disease    S/p DES to mRCA in 12/21 // S/p DES to LCx x 2 in 12/2020 // Diff dz in small to mod Dx (not a good target for PCI); OM1 100 CTO; severe dz in small OM2 and PDA >> Med Rx   Depression    Diabetes mellitus without complication (HCC)    type 2   Fibromyalgia    Gallstones    GERD (gastroesophageal reflux disease)    Hepatitis B    HLD (hyperlipidemia)    Hypertension    Osteoarthritis    Pneumonia    PONV (postoperative nausea and vomiting)    SBO (small bowel obstruction) (HCC)    Sleep apnea    does not use CPAP   Past Surgical History:  Procedure Laterality Date   CARDIAC CATHETERIZATION  01/17/2021   CATARACT EXTRACTION W/ INTRAOCULAR LENS  IMPLANT, BILATERAL     CHOLECYSTECTOMY     CHOLECYSTECTOMY, LAPAROSCOPIC     COLONOSCOPY WITH PROPOFOL N/A  12/19/2022   Procedure: COLONOSCOPY WITH PROPOFOL;  Surgeon: Jenel Lucks, MD;  Location: [facility] ENDOSCOPY;  Service: Gastroenterology;  Laterality: N/A;   CORONARY STENT INTERVENTION N/A 08/15/2020   Procedure: CORONARY STENT INTERVENTION;  Surgeon: Corky Crafts, MD;  Location: [facility] INVASIVE CV LAB;  Service: Cardiovascular;  Laterality: N/A;   CORONARY STENT INTERVENTION N/A 01/17/2021   Procedure: CORONARY STENT INTERVENTION;  Surgeon: Kathleene Hazel, MD;  Location: [facility] INVASIVE CV LAB;  Service: Cardiovascular;  Laterality: N/A;   CORONARY ULTRASOUND/IVUS N/A 08/15/2020   Procedure: Intravascular Ultrasound/IVUS;  Surgeon: Corky Crafts, MD;  Location: [facility] INVASIVE CV LAB;  Service: Cardiovascular;  Laterality: N/A;   DRUG INDUCED ENDOSCOPY N/A 05/23/2022   Procedure: DRUG INDUCED SLEEP ENDOSCOPY;  Surgeon: Osborn Coho, MD;  Location: [facility] OR;  Service: ENT;  Laterality: N/A;   HERNIA REPAIR     IMPLANTATION OF HYPOGLOSSAL NERVE STIMULATOR Right 07/30/2022   Procedure: IMPLANTATION OF HYPOGLOSSAL NERVE STIMULATOR;  Surgeon: Osborn Coho, MD;  Location: [facility];  Service: ENT;  Laterality: Right;   LEFT HEART CATH AND CORONARY ANGIOGRAPHY N/A 08/15/2020   Procedure: LEFT HEART CATH AND CORONARY ANGIOGRAPHY;  Surgeon: Corky Crafts, MD;  Location: [facility] INVASIVE CV LAB;  Service: Cardiovascular;  Laterality: N/A;   LEFT HEART CATH AND CORONARY ANGIOGRAPHY N/A 01/17/2021   Procedure: LEFT HEART CATH AND CORONARY ANGIOGRAPHY;  Surgeon: Kathleene Hazel, MD;  Location: [facility] INVASIVE CV LAB;  Service: Cardiovascular;  Laterality: N/A;   LUMBAR FUSION     L3-5 with rods   MICRODISCECTOMY LUMBAR  2006   POLYPECTOMY  12/19/2022   Procedure: POLYPECTOMY;  Surgeon: Jenel Lucks, MD;  Location: [facility] ENDOSCOPY;  Service: Gastroenterology;;   SPINAL CORD STIMULATOR INSERTION     blood clot removed from spinal cord   SPINE SURGERY     blood  clot removed from spinal cord   XI ROBOTIC ASSISTED INGUINAL HERNIA REPAIR WITH MESH     x 3 surgeries     No outpatient medications have been marked as taking for the 05/08/23 encounter (Appointment) with Quintella Reichert, MD.     Allergies:   Kiwi extract, Other, Penicillins, Ancef [cefazolin], Latex, Levaquin [levofloxacin], and Peach [prunus persica]   Social History   Tobacco Use   Smoking status: Former    Current packs/day: 0.00    Average packs/day: 1 pack/day for 30.0 years (  30.0 ttl pk-yrs)    Types: Cigarettes    Start date: 68    Quit date: 2007    Years since quitting: 17.7   Smokeless tobacco: Never  Vaping Use   Vaping status: Never Used  Substance Use Topics   Alcohol use: Not Currently   Drug use: Not Currently     Family Hx: The patient's family history includes Anxiety disorder in his father and mother; CAD in his brother; COPD in his mother; Cancer in his father; Colon cancer in his mother; Coronary artery disease in his father; Depression in his father and mother; Leukemia in his maternal grandmother; Multiple sclerosis in his daughter. There is no history of Diabetes.  ROS:   Please see the history of present illness.     All other systems reviewed and are negative.   Prior Sleep studies:   The following studies were reviewed today:  None  Labs/Other Tests and Data Reviewed:     Recent Labs: 07/13/2022: Magnesium 1.9 03/27/2023: ALT 12; BUN 7; Creatinine, Ser 0.82; Hemoglobin 16.0; Platelets 189; Potassium 3.6; Sodium 140   Wt Readings from Last 3 Encounters:  04/15/23 205 lb 12.8 oz (93.4 kg)  04/10/23 194 lb (88 kg)  03/27/23 203 lb (92.1 kg)     Risk Assessment/Calculations:      Objective:    Vital Signs:  There were no vitals taken for this visit.  GEN: Well nourished, well developed in no acute distress HEENT: Normal NECK: No JVD; No carotid bruits LYMPHATICS: No lymphadenopathy CARDIAC:RRR, no murmurs, rubs,  gallops RESPIRATORY:  Clear to auscultation without rales, wheezing or rhonchi  ABDOMEN: Soft, non-tender, non-distended MUSCULOSKELETAL:  No edema; No deformity  SKIN: Warm and dry NEUROLOGIC:  Alert and oriented x 3 PSYCHIATRIC:  Normal affect  ASSESSMENT & PLAN:    OSA   -He  really struggled with his BiPAP device and could not use it at night. -referred to ENT -s/p Inspire device 07/30/2022 by Dr. Annalee Genta -Inspire device activation performed 09/04/22 with Inspire rep with following parameters:             -Initial activation waveform looked good.             -Stimulation levels              -Final settings include : -amplitude 1.2 V with the patient control of 1.2 to 2.2 V -Pulse width 90 s -Rate 33 Hz -Start delay 30 minutes -Pause time 15 minutes -Therapy duration 10 hours -Electrode configuration  A +/-/+ -Office visit 10/21/2022 he was using his ice 8 to 9 hours a night and was on level 9 which was 1.8 V.  He was complaining of feeling sleepy during the day and took an overnight pulse oximetry which showed O2 sats dropping into the 70s on his inspire device  -Office visit 11/13/2022 by Jari Favre,  PA and the inspire rep and device interrogation was performed with adjustments made.   -Office visit 02/07/2023 he was using his device on average 6-7 hours nightly.  Tongue motion looked good at 2.5V and was on 2.5V which is level 7.  He was told he could go down a level if he gets tongue sensitivity.  Also was having significant fatigue and was unclear as to whether this was related to starting back on his full-time job or residual OSA.  He uses 3 L of O2 with his inspire device at that time was no longer waking up gasping for breath  or snoring -In-lab inspire titration 03/18/2023 was inadequate as patient could not be adequately titrated to resolve his apneas -Device interrogation done on office visit today 05/08/2023.  Different electrode configurations were tested both sitting and  supine.  Best electrode configuration appeared to be configure C (-0-).  Different outputs were tested with maximum output 1.4 V.  The device was programmed to a range of 1.0 to 1.4 V.  He was started at level 2 (1.1 V) and instructed to stay at this level for at least a week and then slowly try to increase the level to at least level 4.  His sleep onset time was taken out from 30 minutes to 45 minutes. -He will follow-up in the office in [redacted] weeks along with the inspire rep to see how he is doing  Hypertension -BP adequately controlled in the office today -He had been on Imdur and Toprol but these have fallen off his list  Medication Adjustments/Labs and Tests Ordered: Current medicines are reviewed at length with the patient today.  Concerns regarding medicines are outlined above.   Tests Ordered: No orders of the defined types were placed in this encounter.   Medication Changes: No orders of the defined types were placed in this encounter.   Follow Up: 6 week check-in to see how he is doing on adjustments made to his device with inspire rep at this time  Signed, Armanda Magic, MD  05/08/2023 9:33 AM    [facility]

## 2023-05-10 DIAGNOSIS — G4734 Idiopathic sleep related nonobstructive alveolar hypoventilation: Secondary | ICD-10-CM | POA: Diagnosis not present

## 2023-05-13 ENCOUNTER — Ambulatory Visit: Payer: 59 | Admitting: Internal Medicine

## 2023-05-15 ENCOUNTER — Ambulatory Visit: Payer: 59 | Admitting: Internal Medicine

## 2023-05-19 ENCOUNTER — Other Ambulatory Visit: Payer: Self-pay

## 2023-05-19 ENCOUNTER — Other Ambulatory Visit: Payer: Self-pay | Admitting: Family Medicine

## 2023-05-19 DIAGNOSIS — E611 Iron deficiency: Secondary | ICD-10-CM

## 2023-05-20 ENCOUNTER — Other Ambulatory Visit: Payer: Self-pay

## 2023-05-20 ENCOUNTER — Other Ambulatory Visit (HOSPITAL_BASED_OUTPATIENT_CLINIC_OR_DEPARTMENT_OTHER): Payer: Self-pay

## 2023-05-21 ENCOUNTER — Encounter: Payer: Self-pay | Admitting: Internal Medicine

## 2023-05-21 ENCOUNTER — Ambulatory Visit (INDEPENDENT_AMBULATORY_CARE_PROVIDER_SITE_OTHER): Payer: 59 | Admitting: Internal Medicine

## 2023-05-21 ENCOUNTER — Other Ambulatory Visit (HOSPITAL_BASED_OUTPATIENT_CLINIC_OR_DEPARTMENT_OTHER): Payer: Self-pay

## 2023-05-21 VITALS — BP 132/78 | HR 90 | Resp 16 | Ht 68.0 in | Wt 203.2 lb

## 2023-05-21 DIAGNOSIS — E1169 Type 2 diabetes mellitus with other specified complication: Secondary | ICD-10-CM

## 2023-05-21 DIAGNOSIS — Z7985 Long-term (current) use of injectable non-insulin antidiabetic drugs: Secondary | ICD-10-CM | POA: Diagnosis not present

## 2023-05-21 DIAGNOSIS — E785 Hyperlipidemia, unspecified: Secondary | ICD-10-CM

## 2023-05-21 DIAGNOSIS — E1159 Type 2 diabetes mellitus with other circulatory complications: Secondary | ICD-10-CM

## 2023-05-21 DIAGNOSIS — Z7984 Long term (current) use of oral hypoglycemic drugs: Secondary | ICD-10-CM | POA: Diagnosis not present

## 2023-05-21 DIAGNOSIS — Z794 Long term (current) use of insulin: Secondary | ICD-10-CM | POA: Diagnosis not present

## 2023-05-21 MED ORDER — IRON (FERROUS SULFATE) 325 (65 FE) MG PO TABS
325.0000 mg | ORAL_TABLET | Freq: Every day | ORAL | 1 refills | Status: AC
  Filled 2023-05-21: qty 100, 100d supply, fill #0
  Filled 2023-08-27: qty 100, 90d supply, fill #1
  Filled 2023-11-03: qty 100, 100d supply, fill #1

## 2023-05-21 MED ORDER — PANTOPRAZOLE SODIUM 40 MG PO TBEC
40.0000 mg | DELAYED_RELEASE_TABLET | Freq: Every day | ORAL | 0 refills | Status: DC
  Filled 2023-05-21: qty 90, 90d supply, fill #0

## 2023-05-21 MED ORDER — BUPROPION HCL ER (XL) 300 MG PO TB24
300.0000 mg | ORAL_TABLET | Freq: Every day | ORAL | 0 refills | Status: DC
  Filled 2023-05-21: qty 90, 90d supply, fill #0

## 2023-05-21 NOTE — Patient Instructions (Addendum)
Please use the following pump settings: - Basal rates: 12 am: 0.5 units/h - Insulin to carb ratio: 12 am: 1:20 - Target: 12 am: 1:120 - Correction factor (insulin sensitivity factor):  12 am: 100 - Active insulin time: 5h - Changes infusion site: q3 days   Please do the following approximately 15 minutes before every meal: - Enter carbs (C) - Enter sugars (S) - Start insulin bolus (I)   If you bolus insulin after a meal, try to enter just 50% of the carbs + correct the high blood sugar.   Please continue: - Jardiance 25 mg before breakfast - Mounjaro 15 mg weekly    Please return in 6 months.

## 2023-05-21 NOTE — Progress Notes (Signed)
Patient ID: Brandon [name], male   DOB: [birth year], 67 y.o.   MRN: [id number]  HPI: Brandon [name] is a 67 y.o.-year-old male, returning for follow-up for DM2, dx in 2004, insulin-dependent since 2012, controlled, with complications (CAD, gastroparesis).  He previously saw Dr. Everardo All.  Last visit with me 6 months ago.  Interim history: No increased urination, blurry vision, nausea, chest pain. He a partial SBO 06/2022 (the 3rd episode). He was taken off Ozempic >> was off GLP-1 receptor agonist  >> started Mounjaro 1 month prior to our last visit - tolerated well.  A month ago his PCP increased the dose to 15 mg weekly.  Insulin pump: - T: slim x2  CGM: - Dexcom G6  Insulin: - Humalog  Supplier: [employer]  Reviewed HbA1c: Lab Results  Component Value Date   HGBA1C 6.7 (H) 03/27/2023   HGBA1C 6.6 (H) 09/26/2022   HGBA1C 6.1 (A) 05/13/2022   HGBA1C 6.2 (A) 01/07/2022   HGBA1C 6.3 (H) 11/01/2021   HGBA1C 6.4 (A) 09/04/2021   HGBA1C 6.5 (H) 08/28/2021   HGBA1C 6.1 (A) 05/29/2021   HGBA1C 6.3 (A) 03/22/2021   HGBA1C 6.8 (A) 01/16/2021   Pt is on a regimen of: - Jardiance 25 mg before breakfast - Ozempic 2 mg weekly - lost 65 lbs in last 1.5 years >> Mounjaro 12.5 >> 15 mg weekly (increased by PCP 1 mo ago)  Insulin pump settings: - Basal rates: 12 am: 0.5 units/h - Insulin to carb ratio: 12 am: 1:5 >> 1:15 >> 1:20 - Target: 12 am: 1:120 - Correction factor (insulin sensitivity factor):  12 am: 100 - Active insulin time: 5h - Changes infusion site: q3 days  Total daily dose from basal insulin: 16 units (59%) >> 61% Total daily dose from bolus insulin: 12 units (41%) >> 39% TDD: 30-50 units Average daily boluses: 8, of which only 1 manual!  He checks his sugars with a Dexcom CGM, more than 4 times a day:  Previously:  Previously:   Lowest sugar was 60 >> 54 >> 48 >> 110; he has hypoglycemia awareness at 70.  Highest sugar was 359 >> 261 >> 278 >>  240s.  Glucometer:Freestyle  - no CKD, last BUN/creatinine:  Lab Results  Component Value Date   BUN 7 (L) 03/27/2023   BUN 11 09/26/2022   CREATININE 0.82 03/27/2023   CREATININE 0.75 09/26/2022   Lab Results  Component Value Date   MICRALBCREAT 55 (H) 03/27/2023   MICRALBCREAT <30 03/26/2022  Not on ACE inhibitor/ARB.  -+ Dyslipidemia; last set of lipids: Lab Results  Component Value Date   CHOL 129 03/27/2023   HDL 36 (L) 03/27/2023   LDLCALC 52 03/27/2023   TRIG 259 (H) 03/27/2023   CHOLHDL 3.3 03/26/2022  On Lipitor 80 mg daily.  - last eye exam was on 12/04/2022: No DR.  - no numbness and tingling in his feet.  Last foot exam in 04/2022.  On ASA 81.  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 He had an Inspire device inserted to help with OSA. He likes it.  However, he had oxygen desaturation at night and was started on oxygen.  Since this was drying his mouth and nose, he now also has a humidifier.  ROS: + see HPI  Past Medical History:  Diagnosis Date   Anxiety    Arthritis    Basal cell carcinoma (BCC) in situ of skin    left neck   Chronic  back pain    has spinal cord stimulator and wears buprenorphine patch   Colon polyps    Coronary artery disease    S/p DES to mRCA in 12/21 // S/p DES to LCx x 2 in 12/2020 // Diff dz in small to mod Dx (not a good target for PCI); OM1 100 CTO; severe dz in small OM2 and PDA >> Med Rx   Depression    Diabetes mellitus without complication (HCC)    type 2   Fibromyalgia    Gallstones    GERD (gastroesophageal reflux disease)    Hepatitis B    HLD (hyperlipidemia)    Hypertension    Osteoarthritis    Pneumonia    PONV (postoperative nausea and vomiting)    SBO (small bowel obstruction) (HCC)    Sleep apnea    does not use CPAP   Past Surgical History:  Procedure Laterality Date   CARDIAC CATHETERIZATION  01/17/2021   CATARACT EXTRACTION W/ INTRAOCULAR LENS  IMPLANT,  BILATERAL     CHOLECYSTECTOMY     CHOLECYSTECTOMY, LAPAROSCOPIC     COLONOSCOPY WITH PROPOFOL N/A 12/19/2022   Procedure: COLONOSCOPY WITH PROPOFOL;  Surgeon: Jenel Lucks, MD;  Location: [facility] ENDOSCOPY;  Service: Gastroenterology;  Laterality: N/A;   CORONARY STENT INTERVENTION N/A 08/15/2020   Procedure: CORONARY STENT INTERVENTION;  Surgeon: Corky Crafts, MD;  Location: [facility] INVASIVE CV LAB;  Service: Cardiovascular;  Laterality: N/A;   CORONARY STENT INTERVENTION N/A 01/17/2021   Procedure: CORONARY STENT INTERVENTION;  Surgeon: Kathleene Hazel, MD;  Location: [facility] INVASIVE CV LAB;  Service: Cardiovascular;  Laterality: N/A;   CORONARY ULTRASOUND/IVUS N/A 08/15/2020   Procedure: Intravascular Ultrasound/IVUS;  Surgeon: Corky Crafts, MD;  Location: [facility] INVASIVE CV LAB;  Service: Cardiovascular;  Laterality: N/A;   DRUG INDUCED ENDOSCOPY N/A 05/23/2022   Procedure: DRUG INDUCED SLEEP ENDOSCOPY;  Surgeon: Osborn Coho, MD;  Location: [facility] OR;  Service: ENT;  Laterality: N/A;   HERNIA REPAIR     IMPLANTATION OF HYPOGLOSSAL NERVE STIMULATOR Right 07/30/2022   Procedure: IMPLANTATION OF HYPOGLOSSAL NERVE STIMULATOR;  Surgeon: Osborn Coho, MD;  Location: [facility];  Service: ENT;  Laterality: Right;   LEFT HEART CATH AND CORONARY ANGIOGRAPHY N/A 08/15/2020   Procedure: LEFT HEART CATH AND CORONARY ANGIOGRAPHY;  Surgeon: Corky Crafts, MD;  Location: [facility] INVASIVE CV LAB;  Service: Cardiovascular;  Laterality: N/A;   LEFT HEART CATH AND CORONARY ANGIOGRAPHY N/A 01/17/2021   Procedure: LEFT HEART CATH AND CORONARY ANGIOGRAPHY;  Surgeon: Kathleene Hazel, MD;  Location: [facility] INVASIVE CV LAB;  Service: Cardiovascular;  Laterality: N/A;   LUMBAR FUSION     L3-5 with rods   MICRODISCECTOMY LUMBAR  2006   POLYPECTOMY  12/19/2022   Procedure: POLYPECTOMY;  Surgeon: Jenel Lucks, MD;  Location: [facility] ENDOSCOPY;  Service: Gastroenterology;;    SPINAL CORD STIMULATOR INSERTION     blood clot removed from spinal cord   SPINE SURGERY     blood clot removed from spinal cord   XI ROBOTIC ASSISTED INGUINAL HERNIA REPAIR WITH MESH     x 3 surgeries   Social History   Socioeconomic History   Marital status: Married    Spouse name: Not on file   Number of children: 4   Years of education: 16   Highest education level: Not on file  Occupational History   Occupation: disabled/ respiratory therapist  Tobacco Use   Smoking status: Former    Current packs/day:  0.00    Average packs/day: 1 pack/day for 30.0 years (30.0 ttl pk-yrs)    Types: Cigarettes    Start date: 24    Quit date: 2007    Years since quitting: 17.7   Smokeless tobacco: Never  Vaping Use   Vaping status: Never Used  Substance and Sexual Activity   Alcohol use: Not Currently   Drug use: Not Currently   Sexual activity: Yes  Other Topics Concern   Not on file  Social History Narrative   ** Merged History Encounter **       Social Determinants of Health   Financial Resource Strain: Not on file  Food Insecurity: No Food Insecurity (07/13/2022)   Hunger Vital Sign    Worried About Running Out of Food in the Last Year: Never true    Ran Out of Food in the Last Year: Never true  Transportation Needs: No Transportation Needs (07/13/2022)   PRAPARE - Administrator, Civil Service (Medical): No    Lack of Transportation (Non-Medical): No  Physical Activity: Not on file  Stress: Not on file  Social Connections: Not on file  Intimate Partner Violence: Not At Risk (07/13/2022)   Humiliation, Afraid, Rape, and Kick questionnaire    Fear of Current or Ex-Partner: No    Emotionally Abused: No    Physically Abused: No    Sexually Abused: No   Current Outpatient Medications on File Prior to Visit  Medication Sig Dispense Refill   acetaminophen (TYLENOL) 500 MG tablet Take 1,000 mg by mouth See admin instructions. Take 2 tablets (1000 mg) by mouth  scheduled in the morning & may take 2 tablets (1000 mg) by mouth every 6 hours if needed for pain.     albuterol (VENTOLIN HFA) 108 (90 Base) MCG/ACT inhaler Inhale 2 puffs into the lungs as needed for wheezing or shortness of breath (When sick).     AMBULATORY NON FORMULARY MEDICATION Please provide home oxygen for nocturnal use.  Apply 2-4L of O2 via nasal cannula at bedtime.  Dx: Nocturnal Oxygen Desaturation G47.34 1 Device 0   AMBULATORY NON FORMULARY MEDICATION Please provide humidifier for home oxygen due to nasal irritation with use. 1 Units 0   ARIPiprazole (ABILIFY) 10 MG tablet Take 1 tablet (10 mg total) by mouth every morning. 90 tablet 1   atorvastatin (LIPITOR) 80 MG tablet Take 1 tablet (80 mg total) by mouth daily. 90 tablet 3   b complex vitamins capsule Take 1 capsule by mouth in the morning.     Biotin (BIOTIN MAXIMUM STRENGTH) 10 MG TABS Take 1 tablet (10 mg total) by mouth daily. 90 tablet 1   buprenorphine (BUTRANS) 15 MCG/HR Place 1 patch onto the skin once a week. 4 patch 2   buPROPion (WELLBUTRIN XL) 150 MG 24 hr tablet Take 1 tablet (150 mg total) by mouth daily in addition to 300mg  tablet. 90 tablet 1   buPROPion (WELLBUTRIN XL) 300 MG 24 hr tablet Take 1 tablet (300 mg total) by mouth daily. 90 tablet 0   clopidogrel (PLAVIX) 75 MG tablet Take 1 tablet (75 mg total) by mouth daily with breakfast. 90 tablet 3   Continuous Glucose Sensor (DEXCOM G6 SENSOR) MISC Use as directed. Change sensor every 10 days 9 each 3   docusate sodium (COLACE) 100 MG capsule Take 2 capsules (200 mg total) by mouth in the morning. 200 capsule 6   empagliflozin (JARDIANCE) 25 MG TABS tablet Take 1 tablet (25 mg  total) by mouth daily. 90 tablet 3   GVOKE HYPOPEN 1-PACK 1 MG/0.2ML SOAJ Use as needed for hypoglycemia 0.2 mL PRN   insulin lispro (HUMALOG) 100 UNIT/ML injection For use in pump, total of 60 units per day 60 mL 3   Iron, Ferrous Sulfate, 325 (65 Fe) MG TABS Take 1 tablet (325 mg) by  mouth daily. 100 tablet 1   magnesium oxide (MAG-OX) 400 (240 Mg) MG tablet Take 800 mg by mouth in the morning.     metoCLOPramide (REGLAN) 10 MG tablet Take 1 tablet (10 mg total) by mouth daily as needed for nausea or vomiting (upset stomach). 90 tablet 1   Multiple Vitamin (MULTI-VITAMIN) tablet Take 1 tablet by mouth daily. 90 tablet 1   nitroGLYCERIN (NITROSTAT) 0.4 MG SL tablet Place 0.4 mg under the tongue every 5 (five) minutes x 3 doses as needed for chest pain.     ondansetron (ZOFRAN-ODT) 4 MG disintegrating tablet Take 1 tablet (4 mg total) by mouth every 8 (eight) hours as needed for nausea or vomiting. 20 tablet 0   pantoprazole (PROTONIX) 40 MG tablet Take 1 tablet (40 mg total) by mouth daily. 90 tablet 0   polyethylene glycol powder (GLYCOLAX/MIRALAX) 17 GM/SCOOP powder Take 17 g by mouth daily as needed for mild constipation.     tamsulosin (FLOMAX) 0.4 MG CAPS capsule Take 1 capsule (0.4 mg total) by mouth daily. 90 capsule 1   testosterone (ANDROGEL) 50 MG/5GM (1%) GEL Place 5 g (1 packet) onto the shoulders and upper arms daily as directed. 150 g 3   tirzepatide (MOUNJARO) 15 MG/0.5ML Pen Inject 15 mg into the skin once a week. 6 mL 1   tiZANidine (ZANAFLEX) 4 MG tablet Take 1-1&1/2 tablets (4-6 mg total) by mouth every 8 (eight) hours as needed for muscle spasms. 90 tablet 3   No current facility-administered medications on file prior to visit.   Allergies  Allergen Reactions   Kiwi Extract Swelling    Mouth swelling.    Other Swelling and Other (See Comments)    EGGPLANT. Mouth swelling.   Penicillins Rash    Reaction: unknown   Ancef [Cefazolin] Rash   Latex Rash   Levaquin [Levofloxacin] Rash   Peach [Prunus Persica] Other (See Comments)    Burns mouth   Family History  Problem Relation Age of Onset   COPD Mother    Anxiety disorder Mother    Depression Mother    Colon cancer Mother    Cancer Father        mets, origin unknown   Anxiety disorder Father     Depression Father    Coronary artery disease Father    CAD Brother    Leukemia Maternal Grandmother    Multiple sclerosis Daughter    Diabetes Neg Hx    PE: BP 132/78 (BP Location: Right Arm, Patient Position: Sitting, Cuff Size: Normal)   Pulse 90   Resp 16   Ht 5\' 8"  (1.727 m)   Wt 203 lb 3.2 oz (92.2 kg)   SpO2 98%   BMI 30.90 kg/m  Wt Readings from Last 10 Encounters:  05/21/23 203 lb 3.2 oz (92.2 kg)  05/08/23 198 lb 12.8 oz (90.2 kg)  04/15/23 205 lb 12.8 oz (93.4 kg)  04/10/23 194 lb (88 kg)  03/27/23 203 lb (92.1 kg)  03/18/23 191 lb (86.6 kg)  02/07/23 198 lb 6.4 oz (90 kg)  01/09/23 191 lb (86.6 kg)  12/19/22 193 lb (87.5 kg)  11/12/22 199 lb 3.2 oz (90.4 kg)   Constitutional: overweight, in NAD Eyes:  EOMI, no exophthalmos ENT: no neck masses, no cervical lymphadenopathy Cardiovascular: RRR, No MRG Respiratory: CTA B Musculoskeletal: no deformities Skin:no rashes Neurological: no tremor with outstretched hands  ASSESSMENT: 1. DM2, insulin-dependent, controlled, with complications - CAD - s/p 3 stents - Gastroparesis  2. HL  PLAN:  1. Patient with longstanding, fairly well-controlled type 2 diabetes, on SGLT2 inhibitor, GLP-1/GIP receptor agonist and insulin pump, with good control.  At last visit, HbA1c was slightly high, but still at goal, at 6.7%.  Sugars were mostly fluctuating within the target range with a higher increase after breakfast and occasionally higher blood sugars after the rest of the meals, but without vast majority of the blood sugars still at goal.  He was not bolusing at all for meals at that time and only relying on automatic boluses.  We discussed about the importance of bolusing before meals at least if he has larger meals.  We discussed that if he had to enter the carbs after he already ate, to only do a correction, not to enter carbs. -He was previously on Ozempic and had an SBO.  He was taken off Ozempic at that time but he started  [facility] afterwards which she tolerates well.  We continued the 12.5 mg dose at last visit CGM interpretation: -At today's visit, we reviewed his CGM downloads -the sugars appear worse in the last 2 weeks : It appears that 60% of values are in target range (goal >70%), while 40% are higher than 180 (goal <25%), and 0% are lower than 70 (goal <4%).  The calculated average blood sugar is 172.  The projected HbA1c for the next 3 months (GMI) is 7.4.%.Marland Kitchen -Reviewing the CGM trends, sugars appear to be fluctuating around the upper limit of the target range with higher values after breakfast, lunch and particularly dinner.  Upon reviewing the pump downloads and upon questioning, he is missing many of the insulin doses before meals.  He has days in which he only relies on automatic, correction, boluses, without doing any manual boluses.  When he does bolus, he enters a large amount of carbs when the sugars already high after meals, and I explained that this is too late.  This not only will lead to higher blood sugars after meals but also to potential low-dose later after meal.  I advised him to try his best to inject 15 minutes before the meal but if he has to bolus after the meal, to only enter 50% of the carbs at that time.  Otherwise, I did not suggest a change in regimen, pending increase compliance with insulin boluses. - I suggested to:  Patient Instructions  Please use the following pump settings: - Basal rates: 12 am: 0.5 units/h - Insulin to carb ratio: 12 am: 1:20 - Target: 12 am: 1:120 - Correction factor (insulin sensitivity factor):  12 am: 100 - Active insulin time: 5h - Changes infusion site: q3 days   Please do the following approximately 15 minutes before every meal: - Enter carbs (C) - Enter sugars (S) - Start insulin bolus (I)   If you bolus insulin after a meal, try to enter just 50% of the carbs + correct the high blood sugar.   Please continue: - Jardiance 25 mg before  breakfast - Mounjaro 15 mg weekly    Please return in 6 months.  - advised to check sugars at different times of the  day - 4x a day, rotating check times - advised for yearly eye exams >> he is UTD - return to clinic in 6 months  2. HL -Reviewed his latest lipid panel from 03/2023: LDL at goal, triglycerides high, HDL low: Lab Results  Component Value Date   CHOL 129 03/27/2023   HDL 36 (L) 03/27/2023   LDLCALC 52 03/27/2023   TRIG 259 (H) 03/27/2023   CHOLHDL 3.3 03/26/2022  -He continues Lipitor 80 mg daily without side effects  Carlus Pavlov, MD PhD [facility] Endocrinology

## 2023-05-23 ENCOUNTER — Other Ambulatory Visit: Payer: Self-pay

## 2023-05-23 ENCOUNTER — Other Ambulatory Visit (HOSPITAL_BASED_OUTPATIENT_CLINIC_OR_DEPARTMENT_OTHER): Payer: Self-pay

## 2023-05-23 MED ORDER — ARIPIPRAZOLE 10 MG PO TABS
10.0000 mg | ORAL_TABLET | Freq: Every morning | ORAL | 1 refills | Status: DC
  Filled 2023-05-23: qty 90, 90d supply, fill #0
  Filled 2023-08-15 – 2023-11-03 (×3): qty 90, 90d supply, fill #1

## 2023-05-23 MED ORDER — TESTOSTERONE 50 MG/5GM (1%) TD GEL
5.0000 g | Freq: Every day | TRANSDERMAL | 3 refills | Status: DC
Start: 2023-05-23 — End: 2023-11-21
  Filled 2023-05-23: qty 150, 30d supply, fill #0
  Filled 2023-08-13: qty 150, 30d supply, fill #1
  Filled 2023-11-03: qty 150, 30d supply, fill #2

## 2023-05-26 ENCOUNTER — Other Ambulatory Visit: Payer: Self-pay

## 2023-05-26 ENCOUNTER — Other Ambulatory Visit (HOSPITAL_BASED_OUTPATIENT_CLINIC_OR_DEPARTMENT_OTHER): Payer: Self-pay

## 2023-05-29 ENCOUNTER — Other Ambulatory Visit (HOSPITAL_BASED_OUTPATIENT_CLINIC_OR_DEPARTMENT_OTHER): Payer: Self-pay

## 2023-05-29 ENCOUNTER — Other Ambulatory Visit: Payer: Self-pay | Admitting: Family Medicine

## 2023-05-29 MED ORDER — DEXCOM G6 SENSOR MISC
1.0000 | 3 refills | Status: DC
  Filled 2023-05-29: qty 3, 30d supply, fill #0
  Filled 2023-06-30 – 2023-07-11 (×2): qty 3, 30d supply, fill #1
  Filled 2023-08-13 – 2023-08-25 (×3): qty 3, 30d supply, fill #2

## 2023-05-30 ENCOUNTER — Encounter: Payer: Self-pay | Admitting: Internal Medicine

## 2023-05-30 DIAGNOSIS — G4733 Obstructive sleep apnea (adult) (pediatric): Secondary | ICD-10-CM | POA: Diagnosis not present

## 2023-06-03 ENCOUNTER — Other Ambulatory Visit (HOSPITAL_BASED_OUTPATIENT_CLINIC_OR_DEPARTMENT_OTHER): Payer: Self-pay

## 2023-06-08 ENCOUNTER — Encounter (HOSPITAL_BASED_OUTPATIENT_CLINIC_OR_DEPARTMENT_OTHER): Payer: Self-pay

## 2023-06-08 ENCOUNTER — Other Ambulatory Visit: Payer: Self-pay

## 2023-06-08 ENCOUNTER — Emergency Department (HOSPITAL_BASED_OUTPATIENT_CLINIC_OR_DEPARTMENT_OTHER): Payer: 59

## 2023-06-08 ENCOUNTER — Inpatient Hospital Stay (HOSPITAL_BASED_OUTPATIENT_CLINIC_OR_DEPARTMENT_OTHER)
Admission: EM | Admit: 2023-06-08 | Discharge: 2023-06-11 | DRG: 390 | Disposition: A | Discharge status: Home / Self Care | Payer: 59 | Attending: Internal Medicine | Admitting: Internal Medicine

## 2023-06-08 DIAGNOSIS — R9431 Abnormal electrocardiogram [ECG] [EKG]: Secondary | ICD-10-CM | POA: Diagnosis not present

## 2023-06-08 DIAGNOSIS — Z9842 Cataract extraction status, left eye: Secondary | ICD-10-CM

## 2023-06-08 DIAGNOSIS — F32A Depression, unspecified: Secondary | ICD-10-CM | POA: Diagnosis present

## 2023-06-08 DIAGNOSIS — R1013 Epigastric pain: Secondary | ICD-10-CM | POA: Diagnosis not present

## 2023-06-08 DIAGNOSIS — Z860100 Personal history of colon polyps, unspecified: Secondary | ICD-10-CM

## 2023-06-08 DIAGNOSIS — Z961 Presence of intraocular lens: Secondary | ICD-10-CM | POA: Diagnosis present

## 2023-06-08 DIAGNOSIS — Z79899 Other long term (current) drug therapy: Secondary | ICD-10-CM

## 2023-06-08 DIAGNOSIS — K439 Ventral hernia without obstruction or gangrene: Secondary | ICD-10-CM | POA: Diagnosis not present

## 2023-06-08 DIAGNOSIS — Z7984 Long term (current) use of oral hypoglycemic drugs: Secondary | ICD-10-CM | POA: Diagnosis not present

## 2023-06-08 DIAGNOSIS — K56609 Unspecified intestinal obstruction, unspecified as to partial versus complete obstruction: Principal | ICD-10-CM

## 2023-06-08 DIAGNOSIS — E119 Type 2 diabetes mellitus without complications: Secondary | ICD-10-CM

## 2023-06-08 DIAGNOSIS — K5730 Diverticulosis of large intestine without perforation or abscess without bleeding: Secondary | ICD-10-CM | POA: Diagnosis not present

## 2023-06-08 DIAGNOSIS — I2510 Atherosclerotic heart disease of native coronary artery without angina pectoris: Secondary | ICD-10-CM | POA: Diagnosis present

## 2023-06-08 DIAGNOSIS — K4020 Bilateral inguinal hernia, without obstruction or gangrene, not specified as recurrent: Secondary | ICD-10-CM | POA: Diagnosis not present

## 2023-06-08 DIAGNOSIS — G8929 Other chronic pain: Secondary | ICD-10-CM | POA: Diagnosis not present

## 2023-06-08 DIAGNOSIS — Z818 Family history of other mental and behavioral disorders: Secondary | ICD-10-CM

## 2023-06-08 DIAGNOSIS — N400 Enlarged prostate without lower urinary tract symptoms: Secondary | ICD-10-CM | POA: Diagnosis not present

## 2023-06-08 DIAGNOSIS — Z8701 Personal history of pneumonia (recurrent): Secondary | ICD-10-CM

## 2023-06-08 DIAGNOSIS — Z9109 Other allergy status, other than to drugs and biological substances: Secondary | ICD-10-CM

## 2023-06-08 DIAGNOSIS — M797 Fibromyalgia: Secondary | ICD-10-CM | POA: Diagnosis not present

## 2023-06-08 DIAGNOSIS — Z79891 Long term (current) use of opiate analgesic: Secondary | ICD-10-CM

## 2023-06-08 DIAGNOSIS — Z955 Presence of coronary angioplasty implant and graft: Secondary | ICD-10-CM | POA: Diagnosis not present

## 2023-06-08 DIAGNOSIS — Z9841 Cataract extraction status, right eye: Secondary | ICD-10-CM

## 2023-06-08 DIAGNOSIS — R197 Diarrhea, unspecified: Secondary | ICD-10-CM | POA: Diagnosis not present

## 2023-06-08 DIAGNOSIS — Z881 Allergy status to other antibiotic agents status: Secondary | ICD-10-CM

## 2023-06-08 DIAGNOSIS — R1110 Vomiting, unspecified: Secondary | ICD-10-CM | POA: Diagnosis not present

## 2023-06-08 DIAGNOSIS — Z800 Family history of malignant neoplasm of digestive organs: Secondary | ICD-10-CM

## 2023-06-08 DIAGNOSIS — Z9049 Acquired absence of other specified parts of digestive tract: Secondary | ICD-10-CM

## 2023-06-08 DIAGNOSIS — N281 Cyst of kidney, acquired: Secondary | ICD-10-CM | POA: Diagnosis not present

## 2023-06-08 DIAGNOSIS — E876 Hypokalemia: Secondary | ICD-10-CM | POA: Diagnosis present

## 2023-06-08 DIAGNOSIS — F419 Anxiety disorder, unspecified: Secondary | ICD-10-CM | POA: Diagnosis not present

## 2023-06-08 DIAGNOSIS — Z825 Family history of asthma and other chronic lower respiratory diseases: Secondary | ICD-10-CM

## 2023-06-08 DIAGNOSIS — Z7902 Long term (current) use of antithrombotics/antiplatelets: Secondary | ICD-10-CM

## 2023-06-08 DIAGNOSIS — I25118 Atherosclerotic heart disease of native coronary artery with other forms of angina pectoris: Secondary | ICD-10-CM | POA: Diagnosis not present

## 2023-06-08 DIAGNOSIS — Z820 Family history of epilepsy and other diseases of the nervous system: Secondary | ICD-10-CM

## 2023-06-08 DIAGNOSIS — Z85828 Personal history of other malignant neoplasm of skin: Secondary | ICD-10-CM | POA: Diagnosis not present

## 2023-06-08 DIAGNOSIS — Z9641 Presence of insulin pump (external) (internal): Secondary | ICD-10-CM | POA: Diagnosis not present

## 2023-06-08 DIAGNOSIS — E1169 Type 2 diabetes mellitus with other specified complication: Secondary | ICD-10-CM | POA: Diagnosis present

## 2023-06-08 DIAGNOSIS — K56600 Partial intestinal obstruction, unspecified as to cause: Secondary | ICD-10-CM | POA: Diagnosis not present

## 2023-06-08 DIAGNOSIS — Z23 Encounter for immunization: Secondary | ICD-10-CM | POA: Diagnosis not present

## 2023-06-08 DIAGNOSIS — Z880 Allergy status to penicillin: Secondary | ICD-10-CM

## 2023-06-08 DIAGNOSIS — I10 Essential (primary) hypertension: Secondary | ICD-10-CM | POA: Diagnosis present

## 2023-06-08 DIAGNOSIS — Z981 Arthrodesis status: Secondary | ICD-10-CM

## 2023-06-08 DIAGNOSIS — R1084 Generalized abdominal pain: Secondary | ICD-10-CM | POA: Diagnosis not present

## 2023-06-08 DIAGNOSIS — M549 Dorsalgia, unspecified: Secondary | ICD-10-CM | POA: Diagnosis not present

## 2023-06-08 DIAGNOSIS — K219 Gastro-esophageal reflux disease without esophagitis: Secondary | ICD-10-CM | POA: Diagnosis not present

## 2023-06-08 DIAGNOSIS — E785 Hyperlipidemia, unspecified: Secondary | ICD-10-CM | POA: Diagnosis present

## 2023-06-08 DIAGNOSIS — Z7985 Long-term (current) use of injectable non-insulin antidiabetic drugs: Secondary | ICD-10-CM

## 2023-06-08 DIAGNOSIS — Z806 Family history of leukemia: Secondary | ICD-10-CM

## 2023-06-08 DIAGNOSIS — Z8249 Family history of ischemic heart disease and other diseases of the circulatory system: Secondary | ICD-10-CM

## 2023-06-08 DIAGNOSIS — Z87891 Personal history of nicotine dependence: Secondary | ICD-10-CM

## 2023-06-08 DIAGNOSIS — Z91040 Latex allergy status: Secondary | ICD-10-CM

## 2023-06-08 DIAGNOSIS — G4734 Idiopathic sleep related nonobstructive alveolar hypoventilation: Secondary | ICD-10-CM | POA: Diagnosis not present

## 2023-06-08 DIAGNOSIS — K5669 Other partial intestinal obstruction: Secondary | ICD-10-CM | POA: Diagnosis not present

## 2023-06-08 DIAGNOSIS — Z91018 Allergy to other foods: Secondary | ICD-10-CM

## 2023-06-08 LAB — CBC
HCT: 48 % (ref 39.0–52.0)
Hemoglobin: 16.9 g/dL (ref 13.0–17.0)
MCH: 32.4 pg (ref 26.0–34.0)
MCHC: 35.2 g/dL (ref 30.0–36.0)
MCV: 92.1 fL (ref 80.0–100.0)
Platelets: 187 10*3/uL (ref 150–400)
RBC: 5.21 MIL/uL (ref 4.22–5.81)
RDW: 11.9 % (ref 11.5–15.5)
WBC: 10.9 10*3/uL — ABNORMAL HIGH (ref 4.0–10.5)
nRBC: 0 % (ref 0.0–0.2)

## 2023-06-08 LAB — URINALYSIS, ROUTINE W REFLEX MICROSCOPIC
Glucose, UA: >=500 mg/dL — AB
Ketones, ur: 40 mg/dL — AB
Leukocytes,Ua: NEGATIVE
Nitrite: NEGATIVE
Protein, ur: >=300 mg/dL — AB
Specific Gravity, Urine: >=1.03 (ref 1.005–1.030)
pH: 5.5 (ref 5.0–8.0)

## 2023-06-08 LAB — COMPREHENSIVE METABOLIC PANEL
ALT: 15 U/L (ref 0–44)
AST: 24 U/L (ref 15–41)
Albumin: 4.3 g/dL (ref 3.5–5.0)
Alkaline Phosphatase: 82 U/L (ref 38–126)
Anion gap: 15 (ref 5–15)
BUN: 11 mg/dL (ref 8–23)
CO2: 24 mmol/L (ref 22–32)
Calcium: 9 mg/dL (ref 8.9–10.3)
Chloride: 98 mmol/L (ref 98–111)
Creatinine, Ser: 0.77 mg/dL (ref 0.61–1.24)
GFR, Estimated: >60 mL/min (ref >60)
Glucose, Bld: 192 mg/dL — ABNORMAL HIGH (ref 70–99)
Potassium: 3.4 mmol/L — ABNORMAL LOW (ref 3.5–5.1)
Sodium: 137 mmol/L (ref 135–145)
Total Bilirubin: 1.5 mg/dL — ABNORMAL HIGH (ref 0.3–1.2)
Total Protein: 7.8 g/dL (ref 6.5–8.1)

## 2023-06-08 LAB — URINALYSIS, MICROSCOPIC (REFLEX)

## 2023-06-08 LAB — GLUCOSE, CAPILLARY
Glucose-Capillary: 109 mg/dL — ABNORMAL HIGH (ref 70–99)
Glucose-Capillary: 113 mg/dL — ABNORMAL HIGH (ref 70–99)

## 2023-06-08 LAB — LIPASE, BLOOD: Lipase: 36 U/L (ref 11–51)

## 2023-06-08 LAB — LACTIC ACID, PLASMA: Lactic Acid, Venous: 1.2 mmol/L (ref 0.5–1.9)

## 2023-06-08 MED ORDER — ATORVASTATIN CALCIUM 80 MG PO TABS
80.0000 mg | ORAL_TABLET | Freq: Every day | ORAL | Status: DC
  Administered 2023-06-08 – 2023-06-11 (×4): 80 mg via ORAL
  Filled 2023-06-08 (×4): qty 1

## 2023-06-08 MED ORDER — SODIUM CHLORIDE 0.9% FLUSH
3.0000 mL | Freq: Two times a day (BID) | INTRAVENOUS | Status: DC
  Administered 2023-06-08 – 2023-06-11 (×5): 3 mL via INTRAVENOUS

## 2023-06-08 MED ORDER — BUPROPION HCL ER (XL) 150 MG PO TB24
150.0000 mg | ORAL_TABLET | Freq: Every day | ORAL | Status: DC
  Administered 2023-06-08 – 2023-06-11 (×4): 150 mg via ORAL
  Filled 2023-06-08 (×4): qty 1

## 2023-06-08 MED ORDER — IOHEXOL 300 MG/ML  SOLN
100.0000 mL | Freq: Once | INTRAMUSCULAR | Status: AC | PRN
  Administered 2023-06-08: 100 mL via INTRAVENOUS

## 2023-06-08 MED ORDER — INFLUENZA VAC A&B SURF ANT ADJ 0.5 ML IM SUSY
0.5000 mL | PREFILLED_SYRINGE | INTRAMUSCULAR | Status: AC
  Administered 2023-06-09: 0.5 mL via INTRAMUSCULAR
  Filled 2023-06-08: qty 0.5

## 2023-06-08 MED ORDER — ONDANSETRON HCL 4 MG PO TABS: 4.0000 mg | ORAL_TABLET | Freq: Four times a day (QID) | ORAL | Status: DC | PRN

## 2023-06-08 MED ORDER — ONDANSETRON HCL 4 MG/2ML IJ SOLN: 4.0000 mg | Freq: Four times a day (QID) | INTRAMUSCULAR | Status: DC | PRN

## 2023-06-08 MED ORDER — ONDANSETRON HCL 4 MG/2ML IJ SOLN
4.0000 mg | Freq: Once | INTRAMUSCULAR | Status: AC
  Administered 2023-06-08: 4 mg via INTRAVENOUS
  Filled 2023-06-08: qty 2

## 2023-06-08 MED ORDER — MORPHINE SULFATE (PF) 4 MG/ML IV SOLN
4.0000 mg | Freq: Once | INTRAVENOUS | Status: AC
  Administered 2023-06-08: 4 mg via INTRAVENOUS
  Filled 2023-06-08: qty 1

## 2023-06-08 MED ORDER — ACETAMINOPHEN 650 MG RE SUPP: 650.0000 mg | Freq: Four times a day (QID) | RECTAL | Status: DC | PRN

## 2023-06-08 MED ORDER — HYDRALAZINE HCL 20 MG/ML IJ SOLN: 10.0000 mg | INTRAMUSCULAR | Status: DC | PRN

## 2023-06-08 MED ORDER — BUPRENORPHINE 7.5 MCG/HR TD PTWK
2.0000 | MEDICATED_PATCH | TRANSDERMAL | Status: DC
  Administered 2023-06-09: 2 via TRANSDERMAL
  Filled 2023-06-08: qty 2

## 2023-06-08 MED ORDER — MORPHINE SULFATE (PF) 2 MG/ML IV SOLN
2.0000 mg | INTRAVENOUS | Status: DC | PRN
  Administered 2023-06-08 – 2023-06-09 (×4): 2 mg via INTRAVENOUS
  Filled 2023-06-08 (×4): qty 1

## 2023-06-08 MED ORDER — ALBUTEROL SULFATE (2.5 MG/3ML) 0.083% IN NEBU: 2.5000 mg | INHALATION_SOLUTION | Freq: Four times a day (QID) | RESPIRATORY_TRACT | Status: DC | PRN

## 2023-06-08 MED ORDER — EMPAGLIFLOZIN 25 MG PO TABS
25.0000 mg | ORAL_TABLET | Freq: Every day | ORAL | Status: DC
  Administered 2023-06-08 – 2023-06-11 (×4): 25 mg via ORAL
  Filled 2023-06-08 (×4): qty 1

## 2023-06-08 MED ORDER — CLOPIDOGREL BISULFATE 75 MG PO TABS
75.0000 mg | ORAL_TABLET | Freq: Every day | ORAL | Status: DC
  Administered 2023-06-09: 75 mg via ORAL
  Filled 2023-06-08: qty 1

## 2023-06-08 MED ORDER — ENOXAPARIN SODIUM 40 MG/0.4ML IJ SOSY
40.0000 mg | PREFILLED_SYRINGE | INTRAMUSCULAR | Status: DC
  Administered 2023-06-08 – 2023-06-10 (×3): 40 mg via SUBCUTANEOUS
  Filled 2023-06-08 (×3): qty 0.4

## 2023-06-08 MED ORDER — POTASSIUM CHLORIDE 2 MEQ/ML IV SOLN
INTRAVENOUS | Status: AC
  Filled 2023-06-08 (×2): qty 1000

## 2023-06-08 MED ORDER — INSULIN ASPART 100 UNIT/ML IJ SOLN: 0.0000 [IU] | Freq: Three times a day (TID) | INTRAMUSCULAR | Status: DC

## 2023-06-08 MED ORDER — POTASSIUM CHLORIDE 2 MEQ/ML IV SOLN: INTRAVENOUS | Status: DC

## 2023-06-08 MED ORDER — BUPROPION HCL ER (XL) 300 MG PO TB24
300.0000 mg | ORAL_TABLET | Freq: Every day | ORAL | Status: DC
  Administered 2023-06-08 – 2023-06-11 (×4): 300 mg via ORAL
  Filled 2023-06-08 (×4): qty 1

## 2023-06-08 MED ORDER — SCOPOLAMINE 1 MG/3DAYS TD PT72
1.0000 | MEDICATED_PATCH | TRANSDERMAL | Status: DC
  Administered 2023-06-08: 1.5 mg via TRANSDERMAL
  Filled 2023-06-08 (×2): qty 1

## 2023-06-08 MED ORDER — FENTANYL CITRATE PF 50 MCG/ML IJ SOSY
50.0000 ug | PREFILLED_SYRINGE | Freq: Once | INTRAMUSCULAR | Status: AC
  Administered 2023-06-08: 50 ug via INTRAVENOUS
  Filled 2023-06-08: qty 1

## 2023-06-08 MED ORDER — ARIPIPRAZOLE 5 MG PO TABS
10.0000 mg | ORAL_TABLET | Freq: Every day | ORAL | Status: DC
  Administered 2023-06-08 – 2023-06-11 (×4): 10 mg via ORAL
  Filled 2023-06-08 (×4): qty 2

## 2023-06-08 MED ORDER — DEXTROSE 50 % IV SOLN: 1.0000 | INTRAVENOUS | Status: DC | PRN

## 2023-06-08 MED ORDER — TAMSULOSIN HCL 0.4 MG PO CAPS
0.4000 mg | ORAL_CAPSULE | Freq: Every day | ORAL | Status: DC
  Administered 2023-06-08 – 2023-06-11 (×4): 0.4 mg via ORAL
  Filled 2023-06-08 (×4): qty 1

## 2023-06-08 MED ORDER — ACETAMINOPHEN 325 MG PO TABS: 650.0000 mg | ORAL_TABLET | Freq: Four times a day (QID) | ORAL | Status: DC | PRN

## 2023-06-08 MED ORDER — SODIUM CHLORIDE 0.9 % IV BOLUS
500.0000 mL | Freq: Once | INTRAVENOUS | Status: AC
  Administered 2023-06-08: 500 mL via INTRAVENOUS

## 2023-06-08 MED ORDER — PANTOPRAZOLE SODIUM 40 MG IV SOLR
40.0000 mg | INTRAVENOUS | Status: DC
  Administered 2023-06-08 – 2023-06-10 (×3): 40 mg via INTRAVENOUS
  Filled 2023-06-08 (×3): qty 10

## 2023-06-08 MED ORDER — INSULIN PUMP
Freq: Three times a day (TID) | SUBCUTANEOUS | Status: DC
  Administered 2023-06-09: 4.4 via SUBCUTANEOUS
  Filled 2023-06-08: qty 1

## 2023-06-08 NOTE — H&P (Addendum)
History and Physical    Patient: Brandon [name] YQM:[id number] DOB: 26-Apr-1956 DOA: 06/08/2023 DOS: the patient was seen and examined on 06/08/2023 PCP: Everrett Coombe, DO  Patient coming from: Transfer from [facility] ED  Chief Complaint:  Chief Complaint  Patient presents with   Abdominal Pain   HPI: Brandon [name] is a 67 y.o. male with medical history significant of hypertension, hyperlipidemia, CAD  s/p PCI, diabetes mellitus type 2 on insulin pump, small bowel obstruction, and chronic back pain from who presented with complaints of abdominal pain.  He states that he was having diarrhea all day yesterday.  He woke up this morning around 2 AM and had diarrhea.  Thereafter at around 4:30 AM reported having large nonbloody emesis reported that he has been having severe epigastric pain.  Denies having any recent fevers, chest pain, or dysuria.  At this time is still able to pass flatus. He makes note that he has had surgery to correct bilateral inguinal hernias and umbilical hernia  in the past.  Last colonoscopy was performed on 11/2022 by Dr. Tomasa Rand which noted one 3 mm polyp which was removed.  Review of records makes note that patient had a prior partial small bowel obstruction back in 06/2022.  Symptoms resolved with conservative measures and did not require NG tube placement.  In the emergency department patient was noted to be afebrile with stable vital signs.  Labs significant for WBC 10.9 and potassium 3.4.  CT scan of the abdomen pelvis noted diffuse small bowel dilation persistent with early or partial obstruction versus ileus, possible retained stool in the right colon, bilateral hernias appreciated, and right kidney cyst.  Patient had been given 500 mL normal saline IV fluids, antiemetics, and pain medications.  General surgery have been consulted and recommended admission.  Patient was not noted to be actively vomiting and therefore NG tube was deferred at that  time.   Review of Systems: As mentioned in the history of present illness. All other systems reviewed and are negative. Past Medical History:  Diagnosis Date   Anxiety    Arthritis    Basal cell carcinoma (BCC) in situ of skin    left neck   Chronic back pain    has spinal cord stimulator and wears buprenorphine patch   Colon polyps    Coronary artery disease    S/p DES to mRCA in 12/21 // S/p DES to LCx x 2 in 12/2020 // Diff dz in small to mod Dx (not a good target for PCI); OM1 100 CTO; severe dz in small OM2 and PDA >> Med Rx   Depression    Diabetes mellitus without complication (HCC)    type 2   Fibromyalgia    Gallstones    GERD (gastroesophageal reflux disease)    Hepatitis B    HLD (hyperlipidemia)    Hypertension    Osteoarthritis    Pneumonia    PONV (postoperative nausea and vomiting)    SBO (small bowel obstruction) (HCC)    Sleep apnea    does not use CPAP   Past Surgical History:  Procedure Laterality Date   CARDIAC CATHETERIZATION  01/17/2021   CATARACT EXTRACTION W/ INTRAOCULAR LENS  IMPLANT, BILATERAL     CHOLECYSTECTOMY     CHOLECYSTECTOMY, LAPAROSCOPIC     COLONOSCOPY WITH PROPOFOL N/A 12/19/2022   Procedure: COLONOSCOPY WITH PROPOFOL;  Surgeon: Jenel Lucks, MD;  Location: [facility] ENDOSCOPY;  Service: Gastroenterology;  Laterality: N/A;   CORONARY  STENT INTERVENTION N/A 08/15/2020   Procedure: CORONARY STENT INTERVENTION;  Surgeon: Corky Crafts, MD;  Location: [facility] INVASIVE CV LAB;  Service: Cardiovascular;  Laterality: N/A;   CORONARY STENT INTERVENTION N/A 01/17/2021   Procedure: CORONARY STENT INTERVENTION;  Surgeon: Kathleene Hazel, MD;  Location: [facility] INVASIVE CV LAB;  Service: Cardiovascular;  Laterality: N/A;   CORONARY ULTRASOUND/IVUS N/A 08/15/2020   Procedure: Intravascular Ultrasound/IVUS;  Surgeon: Corky Crafts, MD;  Location: [facility] INVASIVE CV LAB;  Service: Cardiovascular;  Laterality: N/A;   DRUG INDUCED ENDOSCOPY N/A  05/23/2022   Procedure: DRUG INDUCED SLEEP ENDOSCOPY;  Surgeon: Osborn Coho, MD;  Location: [facility] OR;  Service: ENT;  Laterality: N/A;   HERNIA REPAIR     IMPLANTATION OF HYPOGLOSSAL NERVE STIMULATOR Right 07/30/2022   Procedure: IMPLANTATION OF HYPOGLOSSAL NERVE STIMULATOR;  Surgeon: Osborn Coho, MD;  Location: [facility];  Service: ENT;  Laterality: Right;   LEFT HEART CATH AND CORONARY ANGIOGRAPHY N/A 08/15/2020   Procedure: LEFT HEART CATH AND CORONARY ANGIOGRAPHY;  Surgeon: Corky Crafts, MD;  Location: [facility] INVASIVE CV LAB;  Service: Cardiovascular;  Laterality: N/A;   LEFT HEART CATH AND CORONARY ANGIOGRAPHY N/A 01/17/2021   Procedure: LEFT HEART CATH AND CORONARY ANGIOGRAPHY;  Surgeon: Kathleene Hazel, MD;  Location: [facility] INVASIVE CV LAB;  Service: Cardiovascular;  Laterality: N/A;   LUMBAR FUSION     L3-5 with rods   MICRODISCECTOMY LUMBAR  2006   POLYPECTOMY  12/19/2022   Procedure: POLYPECTOMY;  Surgeon: Jenel Lucks, MD;  Location: [facility] ENDOSCOPY;  Service: Gastroenterology;;   SPINAL CORD STIMULATOR INSERTION     blood clot removed from spinal cord   SPINE SURGERY     blood clot removed from spinal cord   XI ROBOTIC ASSISTED INGUINAL HERNIA REPAIR WITH MESH     x 3 surgeries   Social History:  reports that he quit smoking about 17 years ago. His smoking use included cigarettes. He started smoking about 47 years ago. He has a 30 pack-year smoking history. He has never used smokeless tobacco. He reports that he does not currently use alcohol. He reports that he does not currently use drugs.  Allergies  Allergen Reactions   Kiwi Extract Swelling    Mouth swelling.    Other Swelling and Other (See Comments)    EGGPLANT. Mouth swelling.   Penicillins Rash    Reaction: unknown   Ancef [Cefazolin] Rash   Latex Rash   Levaquin [Levofloxacin] Rash   Peach [Prunus Persica] Other (See Comments)    Burns mouth    Family History  Problem  Relation Age of Onset   COPD Mother    Anxiety disorder Mother    Depression Mother    Colon cancer Mother    Cancer Father        mets, origin unknown   Anxiety disorder Father    Depression Father    Coronary artery disease Father    CAD Brother    Leukemia Maternal Grandmother    Multiple sclerosis Daughter    Diabetes Neg Hx     Prior to Admission medications   Medication Sig Start Date End Date Taking? Authorizing Provider  acetaminophen (TYLENOL) 500 MG tablet Take 1,000 mg by mouth See admin instructions. Take 2 tablets (1000 mg) by mouth scheduled in the morning & may take 2 tablets (1000 mg) by mouth every 6 hours if needed for pain.    [provider]  albuterol (VENTOLIN HFA) 108 (  90 Base) MCG/ACT inhaler Inhale 2 puffs into the lungs as needed for wheezing or shortness of breath (When sick). 10/29/21   [provider]  AMBULATORY NON FORMULARY MEDICATION Please provide home oxygen for nocturnal use.  Apply 2-4L of O2 via nasal cannula at bedtime.  Dx: Nocturnal Oxygen Desaturation G47.34 11/06/22   Everrett Coombe, DO  AMBULATORY NON FORMULARY MEDICATION Please provide humidifier for home oxygen due to nasal irritation with use. 11/13/22   Everrett Coombe, DO  ARIPiprazole (ABILIFY) 10 MG tablet Take 1 tablet (10 mg total) by mouth every morning. 05/23/23   Everrett Coombe, DO  atorvastatin (LIPITOR) 80 MG tablet Take 1 tablet (80 mg total) by mouth daily. 08/21/22   Meriam Sprague, MD  b complex vitamins capsule Take 1 capsule by mouth in the morning.    [provider]  Biotin (BIOTIN MAXIMUM STRENGTH) 10 MG TABS Take 1 tablet (10 mg total) by mouth daily. 02/14/23   Everrett Coombe, DO  buprenorphine (BUTRANS) 15 MCG/HR Place 1 patch onto the skin once a week. 05/12/23 08/04/23  Edward Jolly, MD  buPROPion (WELLBUTRIN XL) 150 MG 24 hr tablet Take 1 tablet (150 mg total) by mouth daily in addition to 300mg  tablet. 03/25/23   Everrett Coombe, DO  buPROPion  (WELLBUTRIN XL) 300 MG 24 hr tablet Take 1 tablet (300 mg total) by mouth daily. 05/21/23   Everrett Coombe, DO  clopidogrel (PLAVIX) 75 MG tablet Take 1 tablet (75 mg total) by mouth daily with breakfast. 12/20/22   Jenel Lucks, MD  Continuous Glucose Sensor (DEXCOM G6 SENSOR) MISC Use as directed. Change sensor every 10 days 03/11/23   Everrett Coombe, DO  Continuous Glucose Sensor (DEXCOM G6 SENSOR) MISC Use as directed. Change sensor every 10 days 05/29/23   Everrett Coombe, DO  docusate sodium (COLACE) 100 MG capsule Take 2 capsules (200 mg total) by mouth in the morning. 10/24/22   Everrett Coombe, DO  empagliflozin (JARDIANCE) 25 MG TABS tablet Take 1 tablet (25 mg total) by mouth daily. 10/22/22   Everrett Coombe, DO  GVOKE HYPOPEN 1-PACK 1 MG/0.2ML SOAJ Use as needed for hypoglycemia 05/13/22   Carlus Pavlov, MD  insulin lispro (HUMALOG) 100 UNIT/ML injection For use in pump, total of 60 units per day 09/13/22   Carlus Pavlov, MD  Iron, Ferrous Sulfate, 325 (65 Fe) MG TABS Take 1 tablet (325 mg) by mouth daily. 05/21/23   Everrett Coombe, DO  magnesium oxide (MAG-OX) 400 (240 Mg) MG tablet Take 800 mg by mouth in the morning.    [provider]  metoCLOPramide (REGLAN) 10 MG tablet Take 1 tablet (10 mg total) by mouth daily as needed for nausea or vomiting (upset stomach). 10/24/22   Everrett Coombe, DO  Multiple Vitamin (MULTI-VITAMIN) tablet Take 1 tablet by mouth daily. 02/14/23   Everrett Coombe, DO  nitroGLYCERIN (NITROSTAT) 0.4 MG SL tablet Place 0.4 mg under the tongue every 5 (five) minutes x 3 doses as needed for chest pain.    [provider]  ondansetron (ZOFRAN-ODT) 4 MG disintegrating tablet Take 1 tablet (4 mg total) by mouth every 8 (eight) hours as needed for nausea or vomiting. 02/11/23   Everrett Coombe, DO  pantoprazole (PROTONIX) 40 MG tablet Take 1 tablet (40 mg total) by mouth daily. 05/21/23   Everrett Coombe, DO  polyethylene glycol powder (GLYCOLAX/MIRALAX)  17 GM/SCOOP powder Take 17 g by mouth daily as needed for mild constipation. 04/16/22   [provider]  tamsulosin (FLOMAX) 0.4 MG CAPS capsule Take 1 capsule (0.4 mg total) by mouth daily. 02/11/23   Everrett Coombe, DO  testosterone (ANDROGEL) 50 MG/5GM (1%) GEL Place 5 g (1 packet) onto the shoulders and upper arms daily as directed. 05/23/23 06/25/23  Everrett Coombe, DO  tirzepatide [facility]) 15 MG/0.5ML Pen Inject 15 mg into the skin once a week. 03/27/23   Everrett Coombe, DO  tiZANidine (ZANAFLEX) 4 MG tablet Take 1-1&1/2 tablets (4-6 mg total) by mouth every 8 (eight) hours as needed for muscle spasms. 09/04/22   Quintella Reichert, MD    Physical Exam: Vitals:   06/08/23 1100 06/08/23 1116 06/08/23 1200 06/08/23 1300  BP: 120/74  132/69 135/75  Pulse: 91  90 91  Resp: 13  14 16   Temp:  98.4 F (36.9 C)  98.7 F (37.1 C)  TempSrc:  Oral  Oral  SpO2: 95%  95% 100%  Weight:      Height:       Constitutional: Elderly male currently in no acute distress Eyes: PERRL, lids and conjunctivae normal ENMT: Mucous membranes are moist.    Neck: normal, supple,   Respiratory: clear to auscultation bilaterally, no wheezing, no crackles. Normal respiratory effort.   Cardiovascular: Regular rate and rhythm, no murmurs / rubs / gallops. No extremity edema.  No carotid bruits.  Abdomen: Mild tenderness to palpation.  Bowel sounds appreciated and decreased..  Musculoskeletal: no clubbing / cyanosis.  No gross joint deformity appreciated at this time.   Normal muscle tone.  Skin: no rashes, lesions, ulcers.  Skin tenting present. Neurologic: CN 2-12 grossly intact.   Strength 5/5 in all 4.  Psychiatric: Normal judgment and insight. Alert and oriented x 3. Normal mood.   Data Reviewed:  EKG reveals sinus rhythm at 94 bpm with abnormal R wave progression.  Reviewed labs, imaging, and pertinent records as documented in HPI.  Assessment and Plan:  Partial small bowel obstruction History of  small bowel obstruction Acute.  Patient presents with complaints of epigastric abdominal pain with reports of nausea, vomiting, and diarrhea.  On physical exam patient noted to have skin tenting in addition to epigastric abdominal pain.  CT scan of the abdomen pelvis noted diffuse small bowel dilation persistent with early or partial obstruction versus ileus and possible retained stool in the right colon.  He just recently had a colonoscopy earlier this year for which one 3 mm polyp was removed from the transverse colon.  Patient with prior history of similar back in 2023 that resolved without intervention.  Prior abdominal surgeries include ventral hernia and inguinal hernia repairs. -Admit to MedSurg bed -Monitor intake and output -N.p.o. except for meds -Consider need of NG tube if patient develops persistent nausea and vomiting -Morphine IV as needed pain -Zofran as needed nausea and vomiting -Lactated Ringer's at 100 mL/h for 2 L -Appreciate general surgery consultative services, will follow-up for any further recommendations  Hypokalemia Acute.  Initial potassium noted to be 3.2. -20 meq of potassium chloride added to IV fluids -Continue to monitor and replace as needed  Controlled diabetes mellitus type 2, on long-term insulin pump Patient is on insulin pump at baseline and requested continue it during hospitalization.  Last available hemoglobin A1c was 6.7 when checked on 03/27/2023. -Insulin pump order set utilized -Continue Jardiance -Continue Mounjaro in the outpatient setting -Consider discontinuing insulin pump and switching to subcutaneous insulin if having difficulty controlling patient's blood sugars  CAD s/p PCI Last heart cath in 12/2020  where patient had successful DES to the proximal circumflex and mid circumflex with patent proximal to mid RCA stent present unchanged from prior Cath.  -Continue Plavix and atorvastatin  Anxiety and depression -Continue Abilify and  Wellbutrin  Hyperlipidemia -Continue atorvastatin  Chronic back pain -Continue Butrans  BPH -Continue Flomax  GERD -Changed Protonix to IV  DVT prophylaxis: Lovenox Advance Care Planning:   Code Status: Full Code   Consults: General Surgery  Family Communication: Wife updated over the phone  Severity of Illness: The appropriate patient status for this patient is INPATIENT. Inpatient status is judged to be reasonable and necessary in order to provide the required intensity of service to ensure the patient's safety. The patient's presenting symptoms, physical exam findings, and initial radiographic and laboratory data in the context of their chronic comorbidities is felt to place them at high risk for further clinical deterioration. Furthermore, it is not anticipated that the patient will be medically stable for discharge from the hospital within 2 midnights of admission.   * I certify that at the point of admission it is my clinical judgment that the patient will require inpatient hospital care spanning beyond 2 midnights from the point of admission due to high intensity of service, high risk for further deterioration and high frequency of surveillance required.*  Author: Clydie Braun, MD 06/08/2023 1:36 PM  For on call review [email or web address].

## 2023-06-08 NOTE — ED Notes (Signed)
ED TO INPATIENT HANDOFF REPORT  ED Nurse Name and Phone #: Hayden Pedro [phone]  S Name/Age/Gender Brandon [name] 67 y.o. male Room/Bed: MH05/MH05  Code Status   Code Status: Prior  Home/SNF/Other Home Patient oriented to: self, place, time, and situation Is this baseline? Yes   Triage Complete: Triage complete  Chief Complaint SBO (small bowel obstruction) (HCC) [K56.609]  Triage Note Diarrhea yesterday which had resolved  Pt reports upper/epigastric pain and vomiting since this am. hx of bowel obstruction   Allergies Allergies  Allergen Reactions   Kiwi Extract Swelling    Mouth swelling.    Other Swelling and Other (See Comments)    EGGPLANT. Mouth swelling.   Penicillins Rash    Reaction: unknown   Ancef [Cefazolin] Rash   Latex Rash   Levaquin [Levofloxacin] Rash   Peach [Prunus Persica] Other (See Comments)    Burns mouth    Level of Care/Admitting Diagnosis ED Disposition     ED Disposition  Admit   Condition  --   Comment  Hospital Area: [facility] [[id number]]  Level of Care: Med-Surg [16]  May admit patient to [facility] or [facility] if equivalent level of care is available:: Yes  Interfacility transfer: Yes  Covid Evaluation: Asymptomatic - no recent exposure (last 10 days) testing not required  Diagnosis: SBO (small bowel obstruction) [facility]) [[id number]]  Admitting Physician: Maryln Gottron [[id number]]  Attending Physician: Olexa.Dam, MIR Jaxson.Roy [[id number]]  Certification:: I certify this patient will need inpatient services for at least 2 midnights  Expected Medical Readiness: 06/10/2023          B Medical/Surgery History Past Medical History:  Diagnosis Date   Anxiety    Arthritis    Basal cell carcinoma (BCC) in situ of skin    left neck   Chronic back pain    has spinal cord stimulator and wears buprenorphine patch   Colon polyps    Coronary artery disease    S/p DES to mRCA in 12/21 // S/p DES to LCx x 2 in  12/2020 // Diff dz in small to mod Dx (not a good target for PCI); OM1 100 CTO; severe dz in small OM2 and PDA >> Med Rx   Depression    Diabetes mellitus without complication (HCC)    type 2   Fibromyalgia    Gallstones    GERD (gastroesophageal reflux disease)    Hepatitis B    HLD (hyperlipidemia)    Hypertension    Osteoarthritis    Pneumonia    PONV (postoperative nausea and vomiting)    SBO (small bowel obstruction) (HCC)    Sleep apnea    does not use CPAP   Past Surgical History:  Procedure Laterality Date   CARDIAC CATHETERIZATION  01/17/2021   CATARACT EXTRACTION W/ INTRAOCULAR LENS  IMPLANT, BILATERAL     CHOLECYSTECTOMY     CHOLECYSTECTOMY, LAPAROSCOPIC     COLONOSCOPY WITH PROPOFOL N/A 12/19/2022   Procedure: COLONOSCOPY WITH PROPOFOL;  Surgeon: Jenel Lucks, MD;  Location: [facility] ENDOSCOPY;  Service: Gastroenterology;  Laterality: N/A;   CORONARY STENT INTERVENTION N/A 08/15/2020   Procedure: CORONARY STENT INTERVENTION;  Surgeon: Corky Crafts, MD;  Location: [facility] INVASIVE CV LAB;  Service: Cardiovascular;  Laterality: N/A;   CORONARY STENT INTERVENTION N/A 01/17/2021   Procedure: CORONARY STENT INTERVENTION;  Surgeon: Kathleene Hazel, MD;  Location: [facility] INVASIVE CV LAB;  Service: Cardiovascular;  Laterality: N/A;   CORONARY ULTRASOUND/IVUS N/A 08/15/2020  Procedure: Intravascular Ultrasound/IVUS;  Surgeon: Corky Crafts, MD;  Location: [facility] INVASIVE CV LAB;  Service: Cardiovascular;  Laterality: N/A;   DRUG INDUCED ENDOSCOPY N/A 05/23/2022   Procedure: DRUG INDUCED SLEEP ENDOSCOPY;  Surgeon: Osborn Coho, MD;  Location: [facility] OR;  Service: ENT;  Laterality: N/A;   HERNIA REPAIR     IMPLANTATION OF HYPOGLOSSAL NERVE STIMULATOR Right 07/30/2022   Procedure: IMPLANTATION OF HYPOGLOSSAL NERVE STIMULATOR;  Surgeon: Osborn Coho, MD;  Location: [facility];  Service: ENT;  Laterality: Right;   LEFT HEART CATH AND CORONARY ANGIOGRAPHY  N/A 08/15/2020   Procedure: LEFT HEART CATH AND CORONARY ANGIOGRAPHY;  Surgeon: Corky Crafts, MD;  Location: [facility] INVASIVE CV LAB;  Service: Cardiovascular;  Laterality: N/A;   LEFT HEART CATH AND CORONARY ANGIOGRAPHY N/A 01/17/2021   Procedure: LEFT HEART CATH AND CORONARY ANGIOGRAPHY;  Surgeon: Kathleene Hazel, MD;  Location: [facility] INVASIVE CV LAB;  Service: Cardiovascular;  Laterality: N/A;   LUMBAR FUSION     L3-5 with rods   MICRODISCECTOMY LUMBAR  2006   POLYPECTOMY  12/19/2022   Procedure: POLYPECTOMY;  Surgeon: Jenel Lucks, MD;  Location: [facility] ENDOSCOPY;  Service: Gastroenterology;;   SPINAL CORD STIMULATOR INSERTION     blood clot removed from spinal cord   SPINE SURGERY     blood clot removed from spinal cord   XI ROBOTIC ASSISTED INGUINAL HERNIA REPAIR WITH MESH     x 3 surgeries     A IV Location/Drains/Wounds Patient Lines/Drains/Airways Status     Active Line/Drains/Airways     Name Placement date Placement time Site Days   Peripheral IV 06/08/23 20 G Anterior;Proximal;Right Forearm 06/08/23  0803  Forearm  less than 1            Intake/Output Last 24 hours  Intake/Output Summary (Last 24 hours) at 06/08/2023 1209 Last data filed at 06/08/2023 0931 Gross per 24 hour  Intake 500.22 ml  Output --  Net 500.22 ml    Labs/Imaging Results for orders placed or performed during the hospital encounter of 06/08/23 (from the past 48 hour(s))  Lipase, blood     Status: None   Collection Time: 06/08/23  7:49 AM  Result Value Ref Range   Lipase 36 11 - 51 U/L    Comment: Performed at [facility], [address] [place], [place] [address]  Comprehensive metabolic panel     Status: Abnormal   Collection Time: 06/08/23  7:49 AM  Result Value Ref Range   Sodium 137 135 - 145 mmol/L   Potassium 3.4 (L) 3.5 - 5.1 mmol/L   Chloride 98 98 - 111 mmol/L   CO2 24 22 - 32 mmol/L   Glucose, Bld 192 (H) 70 - 99 mg/dL    Comment: Glucose  reference range applies only to samples taken after fasting for at least 8 hours.   BUN 11 8 - 23 mg/dL   Creatinine, Ser 5.40 0.61 - 1.24 mg/dL   Calcium 9.0 8.9 - 98.1 mg/dL   Total Protein 7.8 6.5 - 8.1 g/dL   Albumin 4.3 3.5 - 5.0 g/dL   AST 24 15 - 41 U/L   ALT 15 0 - 44 U/L   Alkaline Phosphatase 82 38 - 126 U/L   Total Bilirubin 1.5 (H) 0.3 - 1.2 mg/dL   GFR, Estimated >19 >14 mL/min    Comment: (NOTE) Calculated using the CKD-EPI Creatinine Equation (2021)    Anion gap 15 5 -  15    Comment: Performed at [facility], [address] [place], [place] [address]  CBC     Status: Abnormal   Collection Time: 06/08/23  7:49 AM  Result Value Ref Range   WBC 10.9 (H) 4.0 - 10.5 K/uL   RBC 5.21 4.22 - 5.81 MIL/uL   Hemoglobin 16.9 13.0 - 17.0 g/dL   HCT 60.4 54.0 - 98.1 %   MCV 92.1 80.0 - 100.0 fL   MCH 32.4 26.0 - 34.0 pg   MCHC 35.2 30.0 - 36.0 g/dL   RDW 19.1 47.8 - 29.5 %   Platelets 187 150 - 400 K/uL   nRBC 0.0 0.0 - 0.2 %    Comment: Performed at [facility], [address] [place], [place] [address]  Lactic acid, plasma     Status: None   Collection Time: 06/08/23  7:53 AM  Result Value Ref Range   Lactic Acid, Venous 1.2 0.5 - 1.9 mmol/L    Comment: Performed at [facility], [address] [place], [place] [address]  Urinalysis, Routine w reflex microscopic -Urine, Clean Catch     Status: Abnormal   Collection Time: 06/08/23  8:39 AM  Result Value Ref Range   Color, Urine AMBER (A) YELLOW    Comment: BIOCHEMICALS MAY BE AFFECTED BY COLOR   APPearance CLEAR CLEAR   Specific Gravity, Urine >=1.030 1.005 - 1.030   pH 5.5 5.0 - 8.0   Glucose, UA >=500 (A) NEGATIVE mg/dL   Hgb urine dipstick TRACE (A) NEGATIVE   Bilirubin Urine SMALL (A) NEGATIVE   Ketones, ur 40 (A) NEGATIVE mg/dL   Protein, ur >=469 (A) NEGATIVE mg/dL   Nitrite NEGATIVE NEGATIVE   Leukocytes,Ua NEGATIVE NEGATIVE    Comment: Performed at [facility], [address] [place], [place] [address]  Urinalysis, Microscopic (reflex)     Status: Abnormal   Collection Time: 06/08/23  8:39 AM  Result Value Ref Range   RBC / HPF 0-5 0 - 5 RBC/hpf   WBC, UA 6-10 0 - 5 WBC/hpf   Bacteria, UA MANY (A) NONE SEEN   Squamous Epithelial / HPF 0-5 0 - 5 /HPF   Mucus PRESENT    Hyaline Casts, UA PRESENT    Granular Casts, UA PRESENT    WBC Casts, UA PRESENT     Comment: Performed at [facility], [address] [place], [place] [address]   CT ABDOMEN PELVIS W CONTRAST  Result Date: 06/08/2023 CLINICAL DATA:  Diarrhea and vomiting.  Epigastric pain. EXAM: CT ABDOMEN AND PELVIS WITH CONTRAST TECHNIQUE: Multidetector CT imaging of the abdomen and pelvis was performed using the standard protocol following bolus administration of intravenous contrast. RADIATION DOSE REDUCTION: This exam was performed according to the departmental dose-optimization program which includes automated exposure control, adjustment of the mA and/or kV according to patient size and/or use of iterative reconstruction technique. CONTRAST:  OMNIPAQUE IOHEXOL 300 MG/ML  SOLN COMPARISON:  07/12/2022. FINDINGS: Lower chest: Linear subsegmental atelectasis or scarring left base. Trace left pleural effusion or thickening. No pericardial effusion. Hepatobiliary: No focal liver abnormality is seen. Status post cholecystectomy. No biliary dilatation. Pancreas: Unremarkable. No pancreatic ductal dilatation or surrounding inflammatory changes. Spleen: Normal in size without focal abnormality. Adrenals/Urinary Tract: No suspicious adrenal lesions. No hydronephrosis or nephrolithiasis. Right kidney upper pole cyst measures 3 cm. Unremarkable urinary bladder. Stomach/Bowel: Stomach is mildly distended and there  is diffuse small bowel dilatation with a change to normal caliber prior to the terminal ileum. No discrete transition point is appreciated. There is mucosal thickening  identified in the cecum and proximal ascending colon raising concern for a mucosal process resulting in partial small bowel obstruction. This may need to be evaluated endoscopically if this finding persists on follow up. There is a possible 4 cm mucosal lesion at the base of the appendix representing an interval change compared to the prior study. This could be an retained stool as well. Appendix is unremarkable. Diverticulosis of the descending and sigmoid. Vascular/Lymphatic: Aortic atherosclerosis. No enlarged abdominal or pelvic lymph nodes. Reproductive: Prostate is unremarkable. Penile prosthesis identified with reservoir in the left anterior lower abdomen. Other: There is a tiny anterior abdominal wall defect to the right of midline with some herniated omental fat. Small bilateral inguinal hernias identified containing fat. Small amount of pelvic free fluid noted. Musculoskeletal: Thoracolumbosacral degenerative changes. Status post posterior fusion and discectomy L4-5. electronic spine stimulation device identified in the left midabdomen posteriorly with wires extending to the lower thoracic spine. IMPRESSION: 1. Diffuse small bowel dilatation consistent with early or partial obstruction versus ileus. 2. Indeterminate plaque-like area of mucosal thickening versus retained stool in the right colon. Follow up recommended to exclude mucosal process. Endoscopic correlation may be necessary. 3. Diverticulosis. 4. Tiny right-sided spigelian hernia and bilateral small inguinal hernias that contain omental fat. 5. Small amount of free fluid in the pelvis. 6. Right kidney cyst. Electronically Signed   By: Layla Maw M.D.   On: 06/08/2023 10:18    Pending Labs Unresulted Labs (From admission, onward)    None       Vitals/Pain Today's Vitals   06/08/23 1100 06/08/23 1113 06/08/23 1116 06/08/23 1200  BP: 120/74   132/69  Pulse: 91   90  Resp: 13   14  Temp:   98.4 F (36.9 C)   TempSrc:   Oral    SpO2: 95%   95%  Weight:      Height:      PainSc:  5       Isolation Precautions No active isolations  Medications Medications  sodium chloride 0.9 % bolus 500 mL (0 mLs Intravenous Stopped 06/08/23 0931)  ondansetron (ZOFRAN) injection 4 mg (4 mg Intravenous Given 06/08/23 0806)  fentaNYL (SUBLIMAZE) injection 50 mcg (50 mcg Intravenous Given 06/08/23 0807)  iohexol (OMNIPAQUE) 300 MG/ML solution 100 mL (100 mLs Intravenous Contrast Given 06/08/23 0905)  morphine (PF) 4 MG/ML injection 4 mg (4 mg Intravenous Given 06/08/23 1019)    Mobility walks with person assist     Focused Assessments Cardiac Assessment Handoff:  Cardiac Rhythm: Normal sinus rhythm Lab Results  Component Value Date   CKTOTAL 84 07/12/2022   Lab Results  Component Value Date   DDIMER 0.29 10/22/2021   Does the Patient currently have chest pain? No    GI - Upper epigastric  pain with  tightness. LBM 10/12 - diarrhea, No vomiting since PTA   R Recommendations: See Admitting Provider Note  Report given to:   Additional Notes:  Ambulatory, independent, continent. Family at [employer]

## 2023-06-08 NOTE — ED Notes (Signed)
Called [facility] for transport at 12:03.

## 2023-06-08 NOTE — ED Notes (Signed)
Report call to receiving department- Maisie Fus RN

## 2023-06-08 NOTE — ED Provider Notes (Signed)
[facility] [facility] Provider Note   CSN: [id number] Arrival date & time: 06/08/23  4098     History  Chief Complaint  Patient presents with   Abdominal Pain    Brandon [name] is a 67 y.o. male.  Patient is a 67 year old male who presents with abdominal pain.  He said he had diarrhea throughout the day yesterday and today started having vomiting which he says was large-volume, nonbloody, nonbilious emesis.  He has some associated abdominal pain.  He is concerned because he had similar symptoms with a small bowel obstruction about a year ago.  This resolved without surgery.  He denies any fevers.  No urinary symptoms.  He does state he is currently still passing gas.       Home Medications Prior to Admission medications   Medication Sig Start Date End Date Taking? Authorizing Provider  acetaminophen (TYLENOL) 500 MG tablet Take 1,000 mg by mouth See admin instructions. Take 2 tablets (1000 mg) by mouth scheduled in the morning & may take 2 tablets (1000 mg) by mouth every 6 hours if needed for pain.    [provider]  albuterol (VENTOLIN HFA) 108 (90 Base) MCG/ACT inhaler Inhale 2 puffs into the lungs as needed for wheezing or shortness of breath (When sick). 10/29/21   [provider]  AMBULATORY NON FORMULARY MEDICATION Please provide home oxygen for nocturnal use.  Apply 2-4L of O2 via nasal cannula at bedtime.  Dx: Nocturnal Oxygen Desaturation G47.34 11/06/22   Everrett Coombe, DO  AMBULATORY NON FORMULARY MEDICATION Please provide humidifier for home oxygen due to nasal irritation with use. 11/13/22   Everrett Coombe, DO  ARIPiprazole (ABILIFY) 10 MG tablet Take 1 tablet (10 mg total) by mouth every morning. 05/23/23   Everrett Coombe, DO  atorvastatin (LIPITOR) 80 MG tablet Take 1 tablet (80 mg total) by mouth daily. 08/21/22   Meriam Sprague, MD  b complex vitamins capsule Take 1 capsule by mouth in the morning.     [provider]  Biotin (BIOTIN MAXIMUM STRENGTH) 10 MG TABS Take 1 tablet (10 mg total) by mouth daily. 02/14/23   Everrett Coombe, DO  buprenorphine (BUTRANS) 15 MCG/HR Place 1 patch onto the skin once a week. 05/12/23 08/04/23  Edward Jolly, MD  buPROPion (WELLBUTRIN XL) 150 MG 24 hr tablet Take 1 tablet (150 mg total) by mouth daily in addition to 300mg  tablet. 03/25/23   Everrett Coombe, DO  buPROPion (WELLBUTRIN XL) 300 MG 24 hr tablet Take 1 tablet (300 mg total) by mouth daily. 05/21/23   Everrett Coombe, DO  clopidogrel (PLAVIX) 75 MG tablet Take 1 tablet (75 mg total) by mouth daily with breakfast. 12/20/22   Jenel Lucks, MD  Continuous Glucose Sensor (DEXCOM G6 SENSOR) MISC Use as directed. Change sensor every 10 days 03/11/23   Everrett Coombe, DO  Continuous Glucose Sensor (DEXCOM G6 SENSOR) MISC Use as directed. Change sensor every 10 days 05/29/23   Everrett Coombe, DO  docusate sodium (COLACE) 100 MG capsule Take 2 capsules (200 mg total) by mouth in the morning. 10/24/22   Everrett Coombe, DO  empagliflozin (JARDIANCE) 25 MG TABS tablet Take 1 tablet (25 mg total) by mouth daily. 10/22/22   Everrett Coombe, DO  GVOKE HYPOPEN 1-PACK 1 MG/0.2ML SOAJ Use as needed for hypoglycemia 05/13/22   Carlus Pavlov, MD  insulin lispro (HUMALOG) 100 UNIT/ML injection For use in pump, total of 60 units per day 09/13/22  Carlus Pavlov, MD  Iron, Ferrous Sulfate, 325 (65 Fe) MG TABS Take 1 tablet (325 mg) by mouth daily. 05/21/23   Everrett Coombe, DO  magnesium oxide (MAG-OX) 400 (240 Mg) MG tablet Take 800 mg by mouth in the morning.    [provider]  metoCLOPramide (REGLAN) 10 MG tablet Take 1 tablet (10 mg total) by mouth daily as needed for nausea or vomiting (upset stomach). 10/24/22   Everrett Coombe, DO  Multiple Vitamin (MULTI-VITAMIN) tablet Take 1 tablet by mouth daily. 02/14/23   Everrett Coombe, DO  nitroGLYCERIN (NITROSTAT) 0.4 MG SL tablet Place 0.4 mg under the tongue  every 5 (five) minutes x 3 doses as needed for chest pain.    [provider]  ondansetron (ZOFRAN-ODT) 4 MG disintegrating tablet Take 1 tablet (4 mg total) by mouth every 8 (eight) hours as needed for nausea or vomiting. 02/11/23   Everrett Coombe, DO  pantoprazole (PROTONIX) 40 MG tablet Take 1 tablet (40 mg total) by mouth daily. 05/21/23   Everrett Coombe, DO  polyethylene glycol powder (GLYCOLAX/MIRALAX) 17 GM/SCOOP powder Take 17 g by mouth daily as needed for mild constipation. 04/16/22   [provider]  tamsulosin (FLOMAX) 0.4 MG CAPS capsule Take 1 capsule (0.4 mg total) by mouth daily. 02/11/23   Everrett Coombe, DO  testosterone (ANDROGEL) 50 MG/5GM (1%) GEL Place 5 g (1 packet) onto the shoulders and upper arms daily as directed. 05/23/23 06/25/23  Everrett Coombe, DO  tirzepatide [facility]) 15 MG/0.5ML Pen Inject 15 mg into the skin once a week. 03/27/23   Everrett Coombe, DO  tiZANidine (ZANAFLEX) 4 MG tablet Take 1-1&1/2 tablets (4-6 mg total) by mouth every 8 (eight) hours as needed for muscle spasms. 09/04/22   Quintella Reichert, MD      Allergies    Kiwi extract, Other, Penicillins, Ancef [cefazolin], Latex, Levaquin [levofloxacin], and Peach [prunus persica]    Review of Systems   Review of Systems  Constitutional:  Negative for chills, diaphoresis, fatigue and fever.  HENT:  Negative for congestion, rhinorrhea and sneezing.   Eyes: Negative.   Respiratory:  Negative for cough, chest tightness and shortness of breath.   Cardiovascular:  Negative for chest pain and leg swelling.  Gastrointestinal:  Positive for abdominal pain, diarrhea, nausea and vomiting. Negative for blood in stool.  Genitourinary:  Negative for difficulty urinating, flank pain, frequency and hematuria.  Musculoskeletal:  Negative for arthralgias and back pain.  Skin:  Negative for rash.  Neurological:  Negative for dizziness, speech difficulty, weakness, numbness and headaches.    Physical  Exam Updated Vital Signs BP 132/69   Pulse 90   Temp 98.4 F (36.9 C) (Oral)   Resp 14   Ht 5\' 8"  (1.727 m)   Wt 88 kg   SpO2 95%   BMI 29.50 kg/m  Physical Exam Constitutional:      Appearance: He is well-developed.  HENT:     Head: Normocephalic and atraumatic.  Eyes:     Pupils: Pupils are equal, round, and reactive to light.  Cardiovascular:     Rate and Rhythm: Normal rate and regular rhythm.     Heart sounds: Normal heart sounds.  Pulmonary:     Effort: Pulmonary effort is normal. No respiratory distress.     Breath sounds: Normal breath sounds. No wheezing or rales.  Chest:     Chest wall: No tenderness.  Abdominal:     General: Bowel sounds are normal. There is distension.  Palpations: Abdomen is soft.     Tenderness: There is generalized abdominal tenderness. There is no guarding or rebound.  Musculoskeletal:        General: Normal range of motion.     Cervical back: Normal range of motion and neck supple.  Lymphadenopathy:     Cervical: No cervical adenopathy.  Skin:    General: Skin is warm and dry.     Findings: No rash.  Neurological:     Mental Status: He is alert and oriented to person, place, and time.     ED Results / Procedures / Treatments   Labs (all labs ordered are listed, but only abnormal results are displayed) Labs Reviewed  COMPREHENSIVE METABOLIC PANEL - Abnormal; Notable for the following components:      Result Value   Potassium 3.4 (*)    Glucose, Bld 192 (*)    Total Bilirubin 1.5 (*)    All other components within normal limits  CBC - Abnormal; Notable for the following components:   WBC 10.9 (*)    All other components within normal limits  URINALYSIS, ROUTINE W REFLEX MICROSCOPIC - Abnormal; Notable for the following components:   Color, Urine AMBER (*)    Glucose, UA >=500 (*)    Hgb urine dipstick TRACE (*)    Bilirubin Urine SMALL (*)    Ketones, ur 40 (*)    Protein, ur >=300 (*)    All other components within  normal limits  URINALYSIS, MICROSCOPIC (REFLEX) - Abnormal; Notable for the following components:   Bacteria, UA MANY (*)    All other components within normal limits  LIPASE, BLOOD  LACTIC ACID, PLASMA    EKG EKG Interpretation Date/Time:  Sunday June 08 2023 07:30:34 EDT Ventricular Rate:  94 PR Interval:  156 QRS Duration:  99 QT Interval:  361 QTC Calculation: 452 R Axis:   51  Text Interpretation: Sinus rhythm Abnormal R-wave progression, early transition Confirmed by Rolan Bucco [phone]) on 06/08/2023 8:00:34 AM  Radiology CT ABDOMEN PELVIS W CONTRAST  Result Date: 06/08/2023 CLINICAL DATA:  Diarrhea and vomiting.  Epigastric pain. EXAM: CT ABDOMEN AND PELVIS WITH CONTRAST TECHNIQUE: Multidetector CT imaging of the abdomen and pelvis was performed using the standard protocol following bolus administration of intravenous contrast. RADIATION DOSE REDUCTION: This exam was performed according to the departmental dose-optimization program which includes automated exposure control, adjustment of the mA and/or kV according to patient size and/or use of iterative reconstruction technique. CONTRAST:  OMNIPAQUE IOHEXOL 300 MG/ML  SOLN COMPARISON:  07/12/2022. FINDINGS: Lower chest: Linear subsegmental atelectasis or scarring left base. Trace left pleural effusion or thickening. No pericardial effusion. Hepatobiliary: No focal liver abnormality is seen. Status post cholecystectomy. No biliary dilatation. Pancreas: Unremarkable. No pancreatic ductal dilatation or surrounding inflammatory changes. Spleen: Normal in size without focal abnormality. Adrenals/Urinary Tract: No suspicious adrenal lesions. No hydronephrosis or nephrolithiasis. Right kidney upper pole cyst measures 3 cm. Unremarkable urinary bladder. Stomach/Bowel: Stomach is mildly distended and there is diffuse small bowel dilatation with a change to normal caliber prior to the terminal ileum. No discrete transition point is  appreciated. There is mucosal thickening identified in the cecum and proximal ascending colon raising concern for a mucosal process resulting in partial small bowel obstruction. This may need to be evaluated endoscopically if this finding persists on follow up. There is a possible 4 cm mucosal lesion at the base of the appendix representing an interval change compared to the prior study. This could  be an retained stool as well. Appendix is unremarkable. Diverticulosis of the descending and sigmoid. Vascular/Lymphatic: Aortic atherosclerosis. No enlarged abdominal or pelvic lymph nodes. Reproductive: Prostate is unremarkable. Penile prosthesis identified with reservoir in the left anterior lower abdomen. Other: There is a tiny anterior abdominal wall defect to the right of midline with some herniated omental fat. Small bilateral inguinal hernias identified containing fat. Small amount of pelvic free fluid noted. Musculoskeletal: Thoracolumbosacral degenerative changes. Status post posterior fusion and discectomy L4-5. electronic spine stimulation device identified in the left midabdomen posteriorly with wires extending to the lower thoracic spine. IMPRESSION: 1. Diffuse small bowel dilatation consistent with early or partial obstruction versus ileus. 2. Indeterminate plaque-like area of mucosal thickening versus retained stool in the right colon. Follow up recommended to exclude mucosal process. Endoscopic correlation may be necessary. 3. Diverticulosis. 4. Tiny right-sided spigelian hernia and bilateral small inguinal hernias that contain omental fat. 5. Small amount of free fluid in the pelvis. 6. Right kidney cyst. Electronically Signed   By: Layla Maw M.D.   On: 06/08/2023 10:18    Procedures Procedures    Medications Ordered in ED Medications  sodium chloride 0.9 % bolus 500 mL (0 mLs Intravenous Stopped 06/08/23 0931)  ondansetron (ZOFRAN) injection 4 mg (4 mg Intravenous Given 06/08/23 0806)   fentaNYL (SUBLIMAZE) injection 50 mcg (50 mcg Intravenous Given 06/08/23 0807)  iohexol (OMNIPAQUE) 300 MG/ML solution 100 mL (100 mLs Intravenous Contrast Given 06/08/23 0905)  morphine (PF) 4 MG/ML injection 4 mg (4 mg Intravenous Given 06/08/23 1019)    ED Course/ Medical Decision Making/ A&P                                 Medical Decision Making Amount and/or Complexity of Data Reviewed Labs: ordered. Radiology: ordered.  Risk Prescription drug management. Decision regarding hospitalization.   Patient is a 67 year old who presents with abdominal pain and vomiting.  He has a history of prior small bowel obstructions.  Labs show a very minor elevation of WBC count.  CT scan shows evidence of a partial small bowel obstruction.  He is not currently vomiting and request to hold off on NG tube at this point.  I reviewed his chart and his last admission, his partial small bowel obstruction resolved without surgery or NG tube.  I spoke with Dr. Erenest Blank who will admit the patient.  Patient requests [facility].  I did also consult with Dr. Alvan Dame with general surgery who will consult on the patient.  Final Clinical Impression(s) / ED Diagnoses Final diagnoses:  SBO (small bowel obstruction) (HCC)    Rx / DC Orders ED Discharge Orders     None         Rolan Bucco, MD 06/08/23 1224

## 2023-06-08 NOTE — ED Triage Notes (Addendum)
Diarrhea yesterday which had resolved  Pt reports upper/epigastric pain and vomiting since this am. hx of bowel obstruction

## 2023-06-08 NOTE — ED Notes (Signed)
Report given to [facility] 

## 2023-06-08 NOTE — Plan of Care (Signed)
Patient alert/oriented X4. Patient compliant with medication administration and tolerated cont. Lactated ringers with potassium added. Patient administered morphine as needed for acute abdominal pain. VSS upon admission and skin assessed with Lloyd Huger, RN. No skin issues noted on admission.   Problem: Education: Goal: Ability to describe self-care measures that may prevent or decrease complications (Diabetes Survival Skills Education) will improve Outcome: Progressing   Problem: Education: Goal: Individualized Educational Video(s) Outcome: Progressing   Problem: Coping: Goal: Ability to adjust to condition or change in health will improve Outcome: Progressing   Problem: Fluid Volume: Goal: Ability to maintain a balanced intake and output will improve Outcome: Progressing   Problem: Health Behavior/Discharge Planning: Goal: Ability to identify and utilize available resources and services will improve Outcome: Progressing   Problem: Health Behavior/Discharge Planning: Goal: Ability to manage health-related needs will improve Outcome: Progressing   Problem: Metabolic: Goal: Ability to maintain appropriate glucose levels will improve Outcome: Progressing   Problem: Nutritional: Goal: Maintenance of adequate nutrition will improve Outcome: Progressing   Problem: Nutritional: Goal: Progress toward achieving an optimal weight will improve Outcome: Progressing   Problem: Skin Integrity: Goal: Risk for impaired skin integrity will decrease Outcome: Progressing   Problem: Tissue Perfusion: Goal: Adequacy of tissue perfusion will improve Outcome: Progressing

## 2023-06-09 DIAGNOSIS — K56609 Unspecified intestinal obstruction, unspecified as to partial versus complete obstruction: Secondary | ICD-10-CM

## 2023-06-09 LAB — BASIC METABOLIC PANEL
Anion gap: 8 (ref 5–15)
BUN: 9 mg/dL (ref 8–23)
CO2: 27 mmol/L (ref 22–32)
Calcium: 8.4 mg/dL — ABNORMAL LOW (ref 8.9–10.3)
Chloride: 104 mmol/L (ref 98–111)
Creatinine, Ser: 0.93 mg/dL (ref 0.61–1.24)
GFR, Estimated: >60 mL/min (ref >60)
Glucose, Bld: 108 mg/dL — ABNORMAL HIGH (ref 70–99)
Potassium: 3.4 mmol/L — ABNORMAL LOW (ref 3.5–5.1)
Sodium: 139 mmol/L (ref 135–145)

## 2023-06-09 LAB — GLUCOSE, CAPILLARY
Glucose-Capillary: 118 mg/dL — ABNORMAL HIGH (ref 70–99)
Glucose-Capillary: 91 mg/dL (ref 70–99)
Glucose-Capillary: 160 mg/dL — ABNORMAL HIGH (ref 70–99)
Glucose-Capillary: 102 mg/dL — ABNORMAL HIGH (ref 70–99)

## 2023-06-09 LAB — CBC
HCT: 41.8 % (ref 39.0–52.0)
Hemoglobin: 14 g/dL (ref 13.0–17.0)
MCH: 31.6 pg (ref 26.0–34.0)
MCHC: 33.5 g/dL (ref 30.0–36.0)
MCV: 94.4 fL (ref 80.0–100.0)
Platelets: 172 10*3/uL (ref 150–400)
RBC: 4.43 MIL/uL (ref 4.22–5.81)
RDW: 12.2 % (ref 11.5–15.5)
WBC: 5.5 10*3/uL (ref 4.0–10.5)
nRBC: 0 % (ref 0.0–0.2)

## 2023-06-09 MED ORDER — POTASSIUM CHLORIDE 20 MEQ PO PACK
40.0000 meq | PACK | Freq: Once | ORAL | Status: AC
  Administered 2023-06-09: 40 meq via ORAL
  Filled 2023-06-09: qty 2

## 2023-06-09 MED ORDER — HYDROMORPHONE HCL 1 MG/ML IJ SOLN
0.5000 mg | INTRAMUSCULAR | Status: DC | PRN
  Administered 2023-06-09 – 2023-06-11 (×10): 0.5 mg via INTRAVENOUS
  Filled 2023-06-09 (×10): qty 0.5

## 2023-06-09 MED ORDER — LACTATED RINGERS IV SOLN: INTRAVENOUS | Status: AC

## 2023-06-09 NOTE — Progress Notes (Signed)
PROGRESS NOTE  Brandon [name]  ZOX:[id number] DOB: [birth year] DOA: 06/08/2023 PCP: Everrett Coombe, DO   Brief Narrative: Patient is a 67 year old male with history of hypertension, hyperlipidemia, coronary artery disease, status post PCI, diabetes type 2 on insulin pump, small bowel obstruction, chronic back pain who presented with abdominal pain, diarrhea, vomiting.  On presentation, lab work showed WBC count of 10.9, potassium 3.4.  CT abdomen/pelvis showed diffuse small bowel dilatation suspicious for early or partial obstruction.  General surgery consulted and following.  Started on clear liquid diet today.  Assessment & Plan:  Active Problems:   Partial small bowel obstruction (HCC)   Hypokalemia   Diabetes mellitus type 2, uncomplicated, on long term insulin pump (HCC)   Coronary artery disease   Anxiety and depression   Hyperlipidemia associated with type 2 diabetes mellitus (HCC)   Chronic back pain   BPH (benign prostatic hyperplasia)   GERD (gastroesophageal reflux disease)   Partial small bowel obstruction: Has history of some bowel obstruction in the past.  Presented with abdominal pain, nausea, vomiting, diarrhea.  CT abdomen/pelvis showed diffuse small bowel dilation consistent with early/partial obstruction versus ileus.  Neurosurgery following.  Started on clear liquid diet today.  Continue gentle IV fluid.  Hypokalemia: Being monitored and supplemented  Diabetes type 2: On insulin pump.  Also on Jardiance.  He takes [facility] as an outpatient which diabetic coordinator has recommended to stop.  Last A1c of 6.7  Coronary artery disease: Status post PCI.  Status post DES to the proximal circumflex and mid circumflex.  On Plavix and atorvastatin at home.  Plavix on hold  Anxiety/depression:On  Abilify, Wellbutrin.  Hyperlipidemia: On Lipitor at home  Chronic back pain: On butrans  BPH: On flomax  GERD: On IV Protonix         DVT prophylaxis:enoxaparin  (LOVENOX) injection 40 mg Start: 06/08/23 1500     Code Status: Full Code  Family Communication: Wife at bedside  Patient status:Inpatient  Patient is from :Home  Anticipated discharge WJ:XBJY  Estimated DC date:after resolution of SBO   Consultants: General Surgery  Procedures: None yet  Antimicrobials:  Anti-infectives (From admission, onward)    None       Subjective: Patient seen and examined at bedside today.  He appeared comfortable.  Sitting on the edge of the bed.  Denies any significant abdominal pain.  No nausea or vomiting today.  Last bowel movement was 4 AM this morning.  Passing gas.  Objective: Vitals:   06/09/23 0010 06/09/23 0623 06/09/23 0752 06/09/23 1138  BP: 101/66 121/72 124/65 126/64  Pulse: 92 88 78 80  Resp: 18 18 16 16   Temp: 98.5 F (36.9 C) 98.6 F (37 C) 98.5 F (36.9 C) 98.2 F (36.8 C)  TempSrc: Oral Oral Oral Oral  SpO2: 93% 97% 97% 98%  Weight:      Height:       No intake or output data in the 24 hours ending 06/09/23 1415 Filed Weights   06/08/23 0726  Weight: 88 kg    Examination:  General exam: Overall comfortable, not in distress HEENT: PERRL Respiratory system:  no wheezes or crackles  Cardiovascular system: S1 & S2 heard, RRR.  Gastrointestinal system: Abdomen is mildly distended, soft and nontender.  Bowel sounds present Central nervous system: Alert and oriented Extremities: No edema, no clubbing ,no cyanosis Skin: No rashes, no ulcers,no icterus     Data Reviewed: I have personally reviewed following labs and imaging studies  CBC: Recent Labs  Lab 06/08/23 0749 06/09/23 0807  WBC 10.9* 5.5  HGB 16.9 14.0  HCT 48.0 41.8  MCV 92.1 94.4  PLT 187 172   Basic Metabolic Panel: Recent Labs  Lab 06/08/23 0749 06/09/23 0807  NA 137 139  K 3.4* 3.4*  CL 98 104  CO2 24 27  GLUCOSE 192* 108*  BUN 11 9  CREATININE 0.77 0.93  CALCIUM 9.0 8.4*     No results found for this or any previous visit  (from the past 240 hour(s)).   Radiology Studies: CT ABDOMEN PELVIS W CONTRAST  Result Date: 06/08/2023 CLINICAL DATA:  Diarrhea and vomiting.  Epigastric pain. EXAM: CT ABDOMEN AND PELVIS WITH CONTRAST TECHNIQUE: Multidetector CT imaging of the abdomen and pelvis was performed using the standard protocol following bolus administration of intravenous contrast. RADIATION DOSE REDUCTION: This exam was performed according to the departmental dose-optimization program which includes automated exposure control, adjustment of the mA and/or kV according to patient size and/or use of iterative reconstruction technique. CONTRAST:  OMNIPAQUE IOHEXOL 300 MG/ML  SOLN COMPARISON:  07/12/2022. FINDINGS: Lower chest: Linear subsegmental atelectasis or scarring left base. Trace left pleural effusion or thickening. No pericardial effusion. Hepatobiliary: No focal liver abnormality is seen. Status post cholecystectomy. No biliary dilatation. Pancreas: Unremarkable. No pancreatic ductal dilatation or surrounding inflammatory changes. Spleen: Normal in size without focal abnormality. Adrenals/Urinary Tract: No suspicious adrenal lesions. No hydronephrosis or nephrolithiasis. Right kidney upper pole cyst measures 3 cm. Unremarkable urinary bladder. Stomach/Bowel: Stomach is mildly distended and there is diffuse small bowel dilatation with a change to normal caliber prior to the terminal ileum. No discrete transition point is appreciated. There is mucosal thickening identified in the cecum and proximal ascending colon raising concern for a mucosal process resulting in partial small bowel obstruction. This may need to be evaluated endoscopically if this finding persists on follow up. There is a possible 4 cm mucosal lesion at the base of the appendix representing an interval change compared to the prior study. This could be an retained stool as well. Appendix is unremarkable. Diverticulosis of the descending and sigmoid.  Vascular/Lymphatic: Aortic atherosclerosis. No enlarged abdominal or pelvic lymph nodes. Reproductive: Prostate is unremarkable. Penile prosthesis identified with reservoir in the left anterior lower abdomen. Other: There is a tiny anterior abdominal wall defect to the right of midline with some herniated omental fat. Small bilateral inguinal hernias identified containing fat. Small amount of pelvic free fluid noted. Musculoskeletal: Thoracolumbosacral degenerative changes. Status post posterior fusion and discectomy L4-5. electronic spine stimulation device identified in the left midabdomen posteriorly with wires extending to the lower thoracic spine. IMPRESSION: 1. Diffuse small bowel dilatation consistent with early or partial obstruction versus ileus. 2. Indeterminate plaque-like area of mucosal thickening versus retained stool in the right colon. Follow up recommended to exclude mucosal process. Endoscopic correlation may be necessary. 3. Diverticulosis. 4. Tiny right-sided spigelian hernia and bilateral small inguinal hernias that contain omental fat. 5. Small amount of free fluid in the pelvis. 6. Right kidney cyst. Electronically Signed   By: Layla Maw M.D.   On: 06/08/2023 10:18    Scheduled Meds:  ARIPiprazole  10 mg Oral Daily   atorvastatin  80 mg Oral Daily   buprenorphine  2 patch Transdermal Weekly   buPROPion  150 mg Oral Daily   buPROPion  300 mg Oral Daily   empagliflozin  25 mg Oral Daily   enoxaparin (LOVENOX) injection  40 mg Subcutaneous Q24H  insulin pump   Subcutaneous TID WC, HS, 0200   pantoprazole (PROTONIX) IV  40 mg Intravenous Q24H   potassium chloride  40 mEq Oral Once   scopolamine  1 patch Transdermal Q72H   sodium chloride flush  3 mL Intravenous Q12H   tamsulosin  0.4 mg Oral Daily   Continuous Infusions:  lactated ringers       LOS: 1 day   Burnadette Pop, MD [facility] P10/14/2024, 2:15 PM

## 2023-06-09 NOTE — Inpatient Diabetes Management (Signed)
Inpatient Diabetes Program Recommendations  AACE/ADA: New Consensus Statement on Inpatient Glycemic Control (2015)  Target Ranges:  Prepandial:   less than 140 mg/dL      Peak postprandial:   less than 180 mg/dL (1-2 hours)      Critically ill patients:  140 - 180 mg/dL   Lab Results  Component Value Date   GLUCAP 118 (H) 06/09/2023   HGBA1C 6.7 (H) 03/27/2023    Review of Glycemic Control  Diabetes history: DM type 2 Outpatient Diabetes medications: Mounjaro 15 mg Weekly, Jardiance 25 mg Daily, Humalog insulin pump Omnipod, Dexcom G6 Insulin pump settings: - Basal rates: 12 am: 0.5 units/h - Insulin to carb ratio: 12 am:  1:20 - Target: 12 am: 1:120 - Correction factor (insulin sensitivity factor):  12 am: 100 - Active insulin time: 5h - Changes infusion site: q3 days  Current orders for Inpatient glycemic control:  Insulin pump tid + hs + 0200 Jardiance 25 mg Daily  Inpatient Diabetes Program Recommendations:    Watch on current ordered regimen  -   Discontinue Mounjaro outpatient and have patient follow up with PCP and Endocrinologist. Small bowel obstruction 06/2022 and was taken off of Ozempic at that time.  Thanks,  Christena Deem RN, MSN, BC-ADM Inpatient Diabetes Coordinator Team Pager [phone] (8a-5p)

## 2023-06-09 NOTE — Consult Note (Signed)
ERAGON DREIBELBIS 02-06-1956  [id number].    Requesting MD: Dr. Renford Dills Chief Complaint/Reason for Consult: SBO  HPI:  67 y/o M w/ a hx of HTN, HLD, CAD s/p PCI (on Plavix), DM, chronic back pain, and prior umbilical and inguinal hernias s/p repair (in [place]) who presents with abdominal pain and nausea.  He reports that he has had 4 prior small bowel obstructions that have been managed without surgery.  On Saturday he experienced frequent diarrhea and abdominal pain. On Sunday he became bloated and experienced emesis. Since admission he has had some nausea but no emesis.  Did have a loose BM this morning.   On exam, patient resting in bed. NAD  ROS: Review of Systems  Constitutional: Negative.   HENT: Negative.    Eyes: Negative.   Respiratory: Negative.    Cardiovascular: Negative.   Gastrointestinal:  Positive for abdominal pain, diarrhea, nausea and vomiting.  Genitourinary: Negative.   Musculoskeletal: Negative.   Skin: Negative.   Neurological: Negative.   Endo/Heme/Allergies: Negative.   Psychiatric/Behavioral: Negative.      Family History  Problem Relation Age of Onset   COPD Mother    Anxiety disorder Mother    Depression Mother    Colon cancer Mother    Cancer Father        mets, origin unknown   Anxiety disorder Father    Depression Father    Coronary artery disease Father    CAD Brother    Leukemia Maternal Grandmother    Multiple sclerosis Daughter    Diabetes Neg Hx     Past Medical History:  Diagnosis Date   Anxiety    Arthritis    Basal cell carcinoma (BCC) in situ of skin    left neck   Chronic back pain    has spinal cord stimulator and wears buprenorphine patch   Colon polyps    Coronary artery disease    S/p DES to mRCA in 12/21 // S/p DES to LCx x 2 in 12/2020 // Diff dz in small to mod Dx (not a good target for PCI); OM1 100 CTO; severe dz in small OM2 and PDA >> Med Rx   Depression    Diabetes mellitus without complication (HCC)     type 2   Fibromyalgia    Gallstones    GERD (gastroesophageal reflux disease)    Hepatitis B    HLD (hyperlipidemia)    Hypertension    Osteoarthritis    Pneumonia    PONV (postoperative nausea and vomiting)    SBO (small bowel obstruction) (HCC)    Sleep apnea    does not use CPAP    Past Surgical History:  Procedure Laterality Date   CARDIAC CATHETERIZATION  01/17/2021   CATARACT EXTRACTION W/ INTRAOCULAR LENS  IMPLANT, BILATERAL     CHOLECYSTECTOMY     CHOLECYSTECTOMY, LAPAROSCOPIC     COLONOSCOPY WITH PROPOFOL N/A 12/19/2022   Procedure: COLONOSCOPY WITH PROPOFOL;  Surgeon: Jenel Lucks, MD;  Location: [facility] ENDOSCOPY;  Service: Gastroenterology;  Laterality: N/A;   CORONARY STENT INTERVENTION N/A 08/15/2020   Procedure: CORONARY STENT INTERVENTION;  Surgeon: Corky Crafts, MD;  Location: [facility] INVASIVE CV LAB;  Service: Cardiovascular;  Laterality: N/A;   CORONARY STENT INTERVENTION N/A 01/17/2021   Procedure: CORONARY STENT INTERVENTION;  Surgeon: Kathleene Hazel, MD;  Location: [facility] INVASIVE CV LAB;  Service: Cardiovascular;  Laterality: N/A;   CORONARY ULTRASOUND/IVUS N/A 08/15/2020   Procedure: Intravascular Ultrasound/IVUS;  Surgeon:  Corky Crafts, MD;  Location: [facility] INVASIVE CV LAB;  Service: Cardiovascular;  Laterality: N/A;   DRUG INDUCED ENDOSCOPY N/A 05/23/2022   Procedure: DRUG INDUCED SLEEP ENDOSCOPY;  Surgeon: Osborn Coho, MD;  Location: [facility] OR;  Service: ENT;  Laterality: N/A;   HERNIA REPAIR     IMPLANTATION OF HYPOGLOSSAL NERVE STIMULATOR Right 07/30/2022   Procedure: IMPLANTATION OF HYPOGLOSSAL NERVE STIMULATOR;  Surgeon: Osborn Coho, MD;  Location: [facility];  Service: ENT;  Laterality: Right;   LEFT HEART CATH AND CORONARY ANGIOGRAPHY N/A 08/15/2020   Procedure: LEFT HEART CATH AND CORONARY ANGIOGRAPHY;  Surgeon: Corky Crafts, MD;  Location: [facility] INVASIVE CV LAB;  Service: Cardiovascular;  Laterality: N/A;    LEFT HEART CATH AND CORONARY ANGIOGRAPHY N/A 01/17/2021   Procedure: LEFT HEART CATH AND CORONARY ANGIOGRAPHY;  Surgeon: Kathleene Hazel, MD;  Location: [facility] INVASIVE CV LAB;  Service: Cardiovascular;  Laterality: N/A;   LUMBAR FUSION     L3-5 with rods   MICRODISCECTOMY LUMBAR  2006   POLYPECTOMY  12/19/2022   Procedure: POLYPECTOMY;  Surgeon: Jenel Lucks, MD;  Location: [facility] ENDOSCOPY;  Service: Gastroenterology;;   SPINAL CORD STIMULATOR INSERTION     blood clot removed from spinal cord   SPINE SURGERY     blood clot removed from spinal cord   XI ROBOTIC ASSISTED INGUINAL HERNIA REPAIR WITH MESH     x 3 surgeries    Social History:  reports that he quit smoking about 17 years ago. His smoking use included cigarettes. He started smoking about 47 years ago. He has a 30 pack-year smoking history. He has never used smokeless tobacco. He reports that he does not currently use alcohol. He reports that he does not currently use drugs.  Allergies:  Allergies  Allergen Reactions   Kiwi Extract Swelling    Mouth swelling.    Other Swelling and Other (See Comments)    EGGPLANT. Mouth swelling.   Penicillins Rash    Reaction: unknown   Ancef [Cefazolin] Rash   Latex Rash   Levaquin [Levofloxacin] Rash   Peach [Prunus Persica] Other (See Comments)    Burns mouth    Medications Prior to Admission  Medication Sig Dispense Refill   acetaminophen (TYLENOL) 500 MG tablet Take 1,000 mg by mouth See admin instructions. Take 2 tablets (1000 mg) by mouth scheduled in the morning & may take 2 tablets (1000 mg) by mouth every 6 hours if needed for pain.     albuterol (VENTOLIN HFA) 108 (90 Base) MCG/ACT inhaler Inhale 2 puffs into the lungs as needed for wheezing or shortness of breath (When sick).     AMBULATORY NON FORMULARY MEDICATION Please provide home oxygen for nocturnal use.  Apply 2-4L of O2 via nasal cannula at bedtime.  Dx: Nocturnal Oxygen Desaturation G47.34 1 Device 0    AMBULATORY NON FORMULARY MEDICATION Please provide humidifier for home oxygen due to nasal irritation with use. 1 Units 0   ARIPiprazole (ABILIFY) 10 MG tablet Take 1 tablet (10 mg total) by mouth every morning. 90 tablet 1   atorvastatin (LIPITOR) 80 MG tablet Take 1 tablet (80 mg total) by mouth daily. 90 tablet 3   b complex vitamins capsule Take 1 capsule by mouth in the morning.     buprenorphine (BUTRANS) 15 MCG/HR Place 1 patch onto the skin once a week. 4 patch 2   buPROPion (WELLBUTRIN XL) 150 MG 24 hr tablet Take 1 tablet (150 mg total) by  mouth daily in addition to 300mg  tablet. 90 tablet 1   buPROPion (WELLBUTRIN XL) 300 MG 24 hr tablet Take 1 tablet (300 mg total) by mouth daily. 90 tablet 0   clopidogrel (PLAVIX) 75 MG tablet Take 1 tablet (75 mg total) by mouth daily with breakfast. 90 tablet 3   docusate sodium (COLACE) 100 MG capsule Take 2 capsules (200 mg total) by mouth in the morning. 200 capsule 6   empagliflozin (JARDIANCE) 25 MG TABS tablet Take 1 tablet (25 mg total) by mouth daily. 90 tablet 3   GVOKE HYPOPEN 1-PACK 1 MG/0.2ML SOAJ Use as needed for hypoglycemia 0.2 mL PRN   insulin lispro (HUMALOG) 100 UNIT/ML injection For use in pump, total of 60 units per day 60 mL 3   Iron, Ferrous Sulfate, 325 (65 Fe) MG TABS Take 1 tablet (325 mg) by mouth daily. 100 tablet 1   magnesium oxide (MAG-OX) 400 (240 Mg) MG tablet Take 800 mg by mouth in the morning.     metoCLOPramide (REGLAN) 10 MG tablet Take 1 tablet (10 mg total) by mouth daily as needed for nausea or vomiting (upset stomach). 90 tablet 1   Multiple Vitamin (MULTI-VITAMIN) tablet Take 1 tablet by mouth daily. 90 tablet 1   nitroGLYCERIN (NITROSTAT) 0.4 MG SL tablet Place 0.4 mg under the tongue every 5 (five) minutes x 3 doses as needed for chest pain.     ondansetron (ZOFRAN-ODT) 4 MG disintegrating tablet Take 1 tablet (4 mg total) by mouth every 8 (eight) hours as needed for nausea or vomiting. 20 tablet 0    pantoprazole (PROTONIX) 40 MG tablet Take 1 tablet (40 mg total) by mouth daily. 90 tablet 0   polyethylene glycol powder (GLYCOLAX/MIRALAX) 17 GM/SCOOP powder Take 17 g by mouth daily as needed for mild constipation.     testosterone (ANDROGEL) 50 MG/5GM (1%) GEL Place 5 g (1 packet) onto the shoulders and upper arms daily as directed. 150 g 3   tirzepatide (MOUNJARO) 15 MG/0.5ML Pen Inject 15 mg into the skin once a week. 6 mL 1   tiZANidine (ZANAFLEX) 4 MG tablet Take 1-1&1/2 tablets (4-6 mg total) by mouth every 8 (eight) hours as needed for muscle spasms. 90 tablet 3   Continuous Glucose Sensor (DEXCOM G6 SENSOR) MISC Use as directed. Change sensor every 10 days 9 each 3   Continuous Glucose Sensor (DEXCOM G6 SENSOR) MISC Use as directed. Change sensor every 10 days 9 each 3   tamsulosin (FLOMAX) 0.4 MG CAPS capsule Take 1 capsule (0.4 mg total) by mouth daily. 90 capsule 1    Physical Exam: Blood pressure 124/65, pulse 78, temperature 98.5 F (36.9 C), temperature source Oral, resp. rate 16, height 5\' 8"  (1.727 m), weight 88 kg, SpO2 97%. Gen: male resting in bed, NAD Resp: equal chest rise CV: RRR Abd: soft, mildly distended, non-tender, no rebound/guarding Neuro: moving all extremities  Results for orders placed or performed during the hospital encounter of 06/08/23 (from the past 48 hour(s))  Lipase, blood     Status: None   Collection Time: 06/08/23  7:49 AM  Result Value Ref Range   Lipase 36 11 - 51 U/L    Comment: Performed at [facility], [address] [place], [place] [address]  Comprehensive metabolic panel     Status: Abnormal   Collection Time: 06/08/23  7:49 AM  Result Value Ref Range   Sodium 137 135 - 145 mmol/L   Potassium 3.4 (  L) 3.5 - 5.1 mmol/L   Chloride 98 98 - 111 mmol/L   CO2 24 22 - 32 mmol/L   Glucose, Bld 192 (H) 70 - 99 mg/dL    Comment: Glucose reference range applies only to samples taken after fasting for at least 8 hours.   BUN 11 8  - 23 mg/dL   Creatinine, Ser 5.78 0.61 - 1.24 mg/dL   Calcium 9.0 8.9 - 46.9 mg/dL   Total Protein 7.8 6.5 - 8.1 g/dL   Albumin 4.3 3.5 - 5.0 g/dL   AST 24 15 - 41 U/L   ALT 15 0 - 44 U/L   Alkaline Phosphatase 82 38 - 126 U/L   Total Bilirubin 1.5 (H) 0.3 - 1.2 mg/dL   GFR, Estimated >62 >95 mL/min    Comment: (NOTE) Calculated using the CKD-EPI Creatinine Equation (2021)    Anion gap 15 5 - 15    Comment: Performed at [facility], [address] [place], [place] [address]  CBC     Status: Abnormal   Collection Time: 06/08/23  7:49 AM  Result Value Ref Range   WBC 10.9 (H) 4.0 - 10.5 K/uL   RBC 5.21 4.22 - 5.81 MIL/uL   Hemoglobin 16.9 13.0 - 17.0 g/dL   HCT 24.4 01.0 - 27.2 %   MCV 92.1 80.0 - 100.0 fL   MCH 32.4 26.0 - 34.0 pg   MCHC 35.2 30.0 - 36.0 g/dL   RDW 53.6 64.4 - 03.4 %   Platelets 187 150 - 400 K/uL   nRBC 0.0 0.0 - 0.2 %    Comment: Performed at [facility], [address] [place], [place] [address]  Lactic acid, plasma     Status: None   Collection Time: 06/08/23  7:53 AM  Result Value Ref Range   Lactic Acid, Venous 1.2 0.5 - 1.9 mmol/L    Comment: Performed at [facility], [address] [place], [place] [address]  Urinalysis, Routine w reflex microscopic -Urine, Clean Catch     Status: Abnormal   Collection Time: 06/08/23  8:39 AM  Result Value Ref Range   Color, Urine AMBER (A) YELLOW    Comment: BIOCHEMICALS MAY BE AFFECTED BY COLOR   APPearance CLEAR CLEAR   Specific Gravity, Urine >=1.030 1.005 - 1.030   pH 5.5 5.0 - 8.0   Glucose, UA >=500 (A) NEGATIVE mg/dL   Hgb urine dipstick TRACE (A) NEGATIVE   Bilirubin Urine SMALL (A) NEGATIVE   Ketones, ur 40 (A) NEGATIVE mg/dL   Protein, ur >=564 (A) NEGATIVE mg/dL   Nitrite NEGATIVE NEGATIVE   Leukocytes,Ua NEGATIVE NEGATIVE    Comment: Performed at [facility], [address] [place], [place] [address]  Urinalysis, Microscopic (reflex)     Status:  Abnormal   Collection Time: 06/08/23  8:39 AM  Result Value Ref Range   RBC / HPF 0-5 0 - 5 RBC/hpf   WBC, UA 6-10 0 - 5 WBC/hpf   Bacteria, UA MANY (A) NONE SEEN   Squamous Epithelial / HPF 0-5 0 - 5 /HPF   Mucus PRESENT    Hyaline Casts, UA PRESENT    Granular Casts, UA PRESENT    WBC Casts, UA PRESENT     Comment: Performed at [facility], [address] [place], [place] [address]  Glucose, capillary     Status: Abnormal   Collection Time: 06/08/23  4:21  PM  Result Value Ref Range   Glucose-Capillary 109 (H) 70 - 99 mg/dL    Comment: Glucose reference range applies only to samples taken after fasting for at least 8 hours.  Glucose, capillary     Status: Abnormal   Collection Time: 06/08/23  8:53 PM  Result Value Ref Range   Glucose-Capillary 113 (H) 70 - 99 mg/dL    Comment: Glucose reference range applies only to samples taken after fasting for at least 8 hours.  Glucose, capillary     Status: Abnormal   Collection Time: 06/09/23  7:55 AM  Result Value Ref Range   Glucose-Capillary 118 (H) 70 - 99 mg/dL    Comment: Glucose reference range applies only to samples taken after fasting for at least 8 hours.  CBC     Status: None   Collection Time: 06/09/23  8:07 AM  Result Value Ref Range   WBC 5.5 4.0 - 10.5 K/uL   RBC 4.43 4.22 - 5.81 MIL/uL   Hemoglobin 14.0 13.0 - 17.0 g/dL   HCT 78.2 95.6 - 21.3 %   MCV 94.4 80.0 - 100.0 fL   MCH 31.6 26.0 - 34.0 pg   MCHC 33.5 30.0 - 36.0 g/dL   RDW 08.6 57.8 - 46.9 %   Platelets 172 150 - 400 K/uL   nRBC 0.0 0.0 - 0.2 %    Comment: Performed at [facility], [address], [place], [place] [address]  Basic metabolic panel     Status: Abnormal   Collection Time: 06/09/23  8:07 AM  Result Value Ref Range   Sodium 139 135 - 145 mmol/L   Potassium 3.4 (L) 3.5 - 5.1 mmol/L   Chloride 104 98 - 111 mmol/L   CO2 27 22 - 32 mmol/L   Glucose, Bld 108 (H) 70 - 99 mg/dL    Comment: Glucose reference range applies only  to samples taken after fasting for at least 8 hours.   BUN 9 8 - 23 mg/dL   Creatinine, Ser 8.41 0.61 - 1.24 mg/dL   Calcium 8.4 (L) 8.9 - 10.3 mg/dL   GFR, Estimated >32 >44 mL/min    Comment: (NOTE) Calculated using the CKD-EPI Creatinine Equation (2021)    Anion gap 8 5 - 15    Comment: Performed at [facility], [address], [place], [place] [address]   CT ABDOMEN PELVIS W CONTRAST  Result Date: 06/08/2023 CLINICAL DATA:  Diarrhea and vomiting.  Epigastric pain. EXAM: CT ABDOMEN AND PELVIS WITH CONTRAST TECHNIQUE: Multidetector CT imaging of the abdomen and pelvis was performed using the standard protocol following bolus administration of intravenous contrast. RADIATION DOSE REDUCTION: This exam was performed according to the departmental dose-optimization program which includes automated exposure control, adjustment of the mA and/or kV according to patient size and/or use of iterative reconstruction technique. CONTRAST:  OMNIPAQUE IOHEXOL 300 MG/ML  SOLN COMPARISON:  07/12/2022. FINDINGS: Lower chest: Linear subsegmental atelectasis or scarring left base. Trace left pleural effusion or thickening. No pericardial effusion. Hepatobiliary: No focal liver abnormality is seen. Status post cholecystectomy. No biliary dilatation. Pancreas: Unremarkable. No pancreatic ductal dilatation or surrounding inflammatory changes. Spleen: Normal in size without focal abnormality. Adrenals/Urinary Tract: No suspicious adrenal lesions. No hydronephrosis or nephrolithiasis. Right kidney upper pole cyst measures 3 cm. Unremarkable urinary bladder. Stomach/Bowel: Stomach is mildly distended and there is diffuse small bowel dilatation with a change to normal caliber prior to the terminal ileum. No discrete transition point  is appreciated. There is mucosal thickening identified in the cecum and proximal ascending colon raising concern for a mucosal process resulting in partial small bowel obstruction. This  may need to be evaluated endoscopically if this finding persists on follow up. There is a possible 4 cm mucosal lesion at the base of the appendix representing an interval change compared to the prior study. This could be an retained stool as well. Appendix is unremarkable. Diverticulosis of the descending and sigmoid. Vascular/Lymphatic: Aortic atherosclerosis. No enlarged abdominal or pelvic lymph nodes. Reproductive: Prostate is unremarkable. Penile prosthesis identified with reservoir in the left anterior lower abdomen. Other: There is a tiny anterior abdominal wall defect to the right of midline with some herniated omental fat. Small bilateral inguinal hernias identified containing fat. Small amount of pelvic free fluid noted. Musculoskeletal: Thoracolumbosacral degenerative changes. Status post posterior fusion and discectomy L4-5. electronic spine stimulation device identified in the left midabdomen posteriorly with wires extending to the lower thoracic spine. IMPRESSION: 1. Diffuse small bowel dilatation consistent with early or partial obstruction versus ileus. 2. Indeterminate plaque-like area of mucosal thickening versus retained stool in the right colon. Follow up recommended to exclude mucosal process. Endoscopic correlation may be necessary. 3. Diverticulosis. 4. Tiny right-sided spigelian hernia and bilateral small inguinal hernias that contain omental fat. 5. Small amount of free fluid in the pelvis. 6. Right kidney cyst. Electronically Signed   By: Layla Maw M.D.   On: 06/08/2023 10:18    Assessment/Plan 67 y/o M w/ a hx of HTN, HLD, CAD s/p PCI (on Plavix), DM, chronic back pain, and prior umbilical and inguinal hernias s/p repair who presents with diarrhea followed by emesis/distention   - CT reviewed and appears to show diffuse dilation of the small bowel concerning for possible ileus/enteritis  - No indication for surgical intervention at this time. He is not clinically obstructed  and there were no signs of bowel compromise on his imaging - Would recommend trial of clears today.  If he develops nausea and/or emesis then he should have an NGT placed with probable contrast study to follow - Hold Plavix  - Surgery will continue to follow  I reviewed last 24 h vitals and pain scores, last 24 h labs and trends, and last 24 h imaging results.  [facility] 06/09/2023, 10:40 AM Please see Amion for pager number during day hours 7:00am-4:30pm or 7:00am -11:30am on weekends

## 2023-06-09 NOTE — Plan of Care (Signed)

## 2023-06-10 ENCOUNTER — Inpatient Hospital Stay (HOSPITAL_COMMUNITY): Payer: 59

## 2023-06-10 DIAGNOSIS — K56600 Partial intestinal obstruction, unspecified as to cause: Secondary | ICD-10-CM | POA: Diagnosis not present

## 2023-06-10 LAB — BASIC METABOLIC PANEL
Anion gap: 8 (ref 5–15)
BUN: <5 mg/dL — ABNORMAL LOW (ref 8–23)
CO2: 28 mmol/L (ref 22–32)
Calcium: 8.6 mg/dL — ABNORMAL LOW (ref 8.9–10.3)
Chloride: 103 mmol/L (ref 98–111)
Creatinine, Ser: 0.78 mg/dL (ref 0.61–1.24)
GFR, Estimated: >60 mL/min (ref >60)
Glucose, Bld: 110 mg/dL — ABNORMAL HIGH (ref 70–99)
Potassium: 3.3 mmol/L — ABNORMAL LOW (ref 3.5–5.1)
Sodium: 139 mmol/L (ref 135–145)

## 2023-06-10 LAB — GLUCOSE, CAPILLARY
Glucose-Capillary: 102 mg/dL — ABNORMAL HIGH (ref 70–99)
Glucose-Capillary: 103 mg/dL — ABNORMAL HIGH (ref 70–99)
Glucose-Capillary: 92 mg/dL (ref 70–99)
Glucose-Capillary: 125 mg/dL — ABNORMAL HIGH (ref 70–99)
Glucose-Capillary: 97 mg/dL (ref 70–99)

## 2023-06-10 MED ORDER — POTASSIUM CHLORIDE CRYS ER 20 MEQ PO TBCR
40.0000 meq | EXTENDED_RELEASE_TABLET | Freq: Once | ORAL | Status: AC
  Administered 2023-06-10: 40 meq via ORAL
  Filled 2023-06-10: qty 2

## 2023-06-10 MED ORDER — POTASSIUM CHLORIDE 10 MEQ/100ML IV SOLN: 10.0000 meq | INTRAVENOUS | Status: DC

## 2023-06-10 NOTE — Progress Notes (Signed)
Subjective: Patient ambulating in the halls.  Complains still of some abdominal distention and pain.  Tolerating CLD with flatus.  No further BM at this time.  No nausea with CLD.  Wanting to see if he can advance to FLD.  ROS: See above, otherwise other systems negative  Objective: Vital signs in last 24 hours: Temp:  [97.7 F (36.5 C)-98.4 F (36.9 C)] 97.7 F (36.5 C) (10/15 0742) Pulse Rate:  [62-86] 65 (10/15 0742) Resp:  [16-20] 18 (10/15 0742) BP: (117-154)/(62-72) 117/62 (10/15 0742) SpO2:  [96 %-99 %] 99 % (10/15 0742) Last BM Date : 06/09/23  Intake/Output from previous day: 10/14 0701 - 10/15 0700 In: 240 [P.O.:240] Out: -  Intake/Output this shift: No intake/output data recorded.  PE: Gen: NAD Abd: soft, but does seem a bit distended on exam, +BS, mild diffuse tenderness, mostly in central abdomen   Lab Results:  Recent Labs    06/08/23 0749 06/09/23 0807  WBC 10.9* 5.5  HGB 16.9 14.0  HCT 48.0 41.8  PLT 187 172   BMET Recent Labs    06/09/23 0807 06/10/23 0750  NA 139 139  K 3.4* 3.3*  CL 104 103  CO2 27 28  GLUCOSE 108* 110*  BUN 9 <5*  CREATININE 0.93 0.78  CALCIUM 8.4* 8.6*   PT/INR No results for input(s): "LABPROT", "INR" in the last 72 hours. CMP     Component Value Date/Time   NA 139 06/10/2023 0750   NA 140 03/27/2023 1057   K 3.3 (L) 06/10/2023 0750   CL 103 06/10/2023 0750   CO2 28 06/10/2023 0750   GLUCOSE 110 (H) 06/10/2023 0750   BUN <5 (L) 06/10/2023 0750   BUN 7 (L) 03/27/2023 1057   CREATININE 0.78 06/10/2023 0750   CREATININE 0.75 09/26/2022 1042   CALCIUM 8.6 (L) 06/10/2023 0750   PROT 7.8 06/08/2023 0749   PROT 6.8 03/27/2023 1057   ALBUMIN 4.3 06/08/2023 0749   ALBUMIN 4.5 03/27/2023 1057   AST 24 06/08/2023 0749   ALT 15 06/08/2023 0749   ALKPHOS 82 06/08/2023 0749   BILITOT 1.5 (H) 06/08/2023 0749   BILITOT 0.7 03/27/2023 1057   GFRNONAA >60 06/10/2023 0750   GFRAA >60 04/17/2020 0510   Lipase      Component Value Date/Time   LIPASE 36 06/08/2023 0749       Studies/Results: No results found.  Anti-infectives: Anti-infectives (From admission, onward)    None        Assessment/Plan Abdominal pain, possible early or partial SBO -numerous previous episodes similar to this, but was having diarrhea prior to this episode this time along with N/V.  Could be some component of viral enteritis as well -tolerated CLD.  Repeat film today actually looks pretty good with a relatively normal bowel gas pattern -will try FLD and see how he does.  If he has more pain or N/V, then may back diet back down.  FEN - FLD, IVFs VTE - plavix on hold ID - none currently  needed  HTN HLD CAD on plavix DM Chronic back pain  I reviewed hospitalist notes, last 24 h vitals and pain scores, last 48 h intake and output, last 24 h labs and trends, and last 24 h imaging results.   LOS: 2 days    Letha Cape , [facility] 06/10/2023, 11:01 AM Please see Amion for pager number during day hours 7:00am-4:30pm or 7:00am -11:30am on weekends

## 2023-06-10 NOTE — Progress Notes (Signed)
PROGRESS NOTE  Brandon [name]  EXB:[id number] DOB: August 30, 1955 DOA: 06/08/2023 PCP: Everrett Coombe, DO   Brief Narrative: Patient is a 67 year old male with history of hypertension, hyperlipidemia, coronary artery disease, status post PCI, diabetes type 2 on insulin pump, small bowel obstruction, chronic back pain who presented with abdominal pain, diarrhea, vomiting.  On presentation, lab work showed WBC count of 10.9, potassium 3.4.  CT abdomen/pelvis showed diffuse small bowel dilatation suspicious for early or partial obstruction.  General surgery consulted and following. Diet advanced to full liquid today  Assessment & Plan:  Active Problems:   Partial small bowel obstruction (HCC)   Hypokalemia   Diabetes mellitus type 2, uncomplicated, on long term insulin pump (HCC)   Coronary artery disease   Anxiety and depression   Hyperlipidemia associated with type 2 diabetes mellitus (HCC)   Chronic back pain   BPH (benign prostatic hyperplasia)   GERD (gastroesophageal reflux disease)   Partial small bowel obstruction: Has history of some bowel obstruction in the past.  Presented with abdominal pain, nausea, vomiting, diarrhea.  CT abdomen/pelvis showed diffuse small bowel dilation consistent with early/partial obstruction versus ileus.  General surgery following.   Abdominal pain has improved but not entirely gone.  He is passing gas but no bowel movement.  Walking in the hallways. Started on full liquid diet.  Abdominal x-ray from this morning pending  Hypokalemia: Being monitored and supplemented  Diabetes type 2: On insulin pump.  Also on Jardiance.  He takes [facility] as an outpatient which diabetic coordinator has recommended to stop.  Last A1c of 6.7  Coronary artery disease: Status post PCI.  Status post DES to the proximal circumflex and mid circumflex.  On Plavix and atorvastatin at home.  Plavix on hold  Anxiety/depression:On  Abilify, Wellbutrin.  Hyperlipidemia: On Lipitor  at home  Chronic back pain: On butrans  BPH: On flomax  GERD: On IV Protonix         DVT prophylaxis:enoxaparin (LOVENOX) injection 40 mg Start: 06/08/23 1500     Code Status: Full Code  Family Communication: Wife at bedside on 10/14  Patient status:Inpatient  Patient is from :Home  Anticipated discharge NU:UVOZ  Estimated DC date:after resolution of SBO   Consultants: General Surgery  Procedures: None yet  Antimicrobials:  Anti-infectives (From admission, onward)    None       Subjective: Patient seen and examined the bedside today.  Hemodynamically stable.  Lying in bed.  Overall appears comfortable.  Passing gas but no bowel movement yet.  No nausea or vomiting.  Abdominal pain has improved.  He walked in the hallways  Objective: Vitals:   06/09/23 1540 06/09/23 1952 06/10/23 0446 06/10/23 0742  BP: (!) 154/72 126/62 122/63 117/62  Pulse: 86 72 62 65  Resp: 16 20 18 18   Temp: 98.4 F (36.9 C) 98 F (36.7 C) 97.7 F (36.5 C) 97.7 F (36.5 C)  TempSrc: Oral Oral Oral Oral  SpO2: 98% 96% 98% 99%  Weight:      Height:        Intake/Output Summary (Last 24 hours) at 06/10/2023 1139 Last data filed at 06/09/2023 1543 Gross per 24 hour  Intake 240 ml  Output --  Net 240 ml   Filed Weights   06/08/23 0726  Weight: 88 kg    Examination:    General exam: Overall comfortable, not in distress HEENT: PERRL Respiratory system:  no wheezes or crackles  Cardiovascular system: S1 & S2 heard, RRR.  Gastrointestinal system: Abdomen is mildly distended, soft and mostly nontender, bowel sounds present Central nervous system: Alert and oriented Extremities: No edema, no clubbing ,no cyanosis Skin: No rashes, no ulcers,no icterus      Data Reviewed: I have personally reviewed following labs and imaging studies  CBC: Recent Labs  Lab 06/08/23 0749 06/09/23 0807  WBC 10.9* 5.5  HGB 16.9 14.0  HCT 48.0 41.8  MCV 92.1 94.4  PLT 187 172    Basic Metabolic Panel: Recent Labs  Lab 06/08/23 0749 06/09/23 0807 06/10/23 0750  NA 137 139 139  K 3.4* 3.4* 3.3*  CL 98 104 103  CO2 24 27 28   GLUCOSE 192* 108* 110*  BUN 11 9 <5*  CREATININE 0.77 0.93 0.78  CALCIUM 9.0 8.4* 8.6*     No results found for this or any previous visit (from the past 240 hour(s)).   Radiology Studies: No results found.  Scheduled Meds:  ARIPiprazole  10 mg Oral Daily   atorvastatin  80 mg Oral Daily   buprenorphine  2 patch Transdermal Weekly   buPROPion  150 mg Oral Daily   buPROPion  300 mg Oral Daily   empagliflozin  25 mg Oral Daily   enoxaparin (LOVENOX) injection  40 mg Subcutaneous Q24H   insulin pump   Subcutaneous TID WC, HS, 0200   pantoprazole (PROTONIX) IV  40 mg Intravenous Q24H   scopolamine  1 patch Transdermal Q72H   sodium chloride flush  3 mL Intravenous Q12H   tamsulosin  0.4 mg Oral Daily   Continuous Infusions:  lactated ringers 75 mL/hr at 06/10/23 0508     LOS: 2 days   Burnadette Pop, MD [facility] P10/15/2024, 11:39 AM

## 2023-06-10 NOTE — Plan of Care (Signed)

## 2023-06-11 ENCOUNTER — Other Ambulatory Visit: Payer: Self-pay

## 2023-06-11 ENCOUNTER — Other Ambulatory Visit (HOSPITAL_BASED_OUTPATIENT_CLINIC_OR_DEPARTMENT_OTHER): Payer: Self-pay

## 2023-06-11 DIAGNOSIS — K56600 Partial intestinal obstruction, unspecified as to cause: Secondary | ICD-10-CM | POA: Diagnosis not present

## 2023-06-11 LAB — BASIC METABOLIC PANEL
Anion gap: 10 (ref 5–15)
BUN: <5 mg/dL — ABNORMAL LOW (ref 8–23)
CO2: 28 mmol/L (ref 22–32)
Calcium: 9.1 mg/dL (ref 8.9–10.3)
Chloride: 101 mmol/L (ref 98–111)
Creatinine, Ser: 0.79 mg/dL (ref 0.61–1.24)
GFR, Estimated: >60 mL/min (ref >60)
Glucose, Bld: 108 mg/dL — ABNORMAL HIGH (ref 70–99)
Potassium: 3.9 mmol/L (ref 3.5–5.1)
Sodium: 139 mmol/L (ref 135–145)

## 2023-06-11 LAB — GLUCOSE, CAPILLARY
Glucose-Capillary: 76 mg/dL (ref 70–99)
Glucose-Capillary: 111 mg/dL — ABNORMAL HIGH (ref 70–99)
Glucose-Capillary: 102 mg/dL — ABNORMAL HIGH (ref 70–99)
Glucose-Capillary: 147 mg/dL — ABNORMAL HIGH (ref 70–99)

## 2023-06-11 MED ORDER — CLOPIDOGREL BISULFATE 75 MG PO TABS: 75.0000 mg | ORAL_TABLET | Freq: Every day | ORAL | Status: DC

## 2023-06-11 MED ORDER — POLYETHYLENE GLYCOL 3350 17 GM/SCOOP PO POWD
17.0000 g | Freq: Every day | ORAL | 0 refills | Status: DC
  Filled 2023-06-11 – 2023-08-13 (×2): qty 238, 14d supply, fill #0

## 2023-06-11 MED ORDER — PANTOPRAZOLE SODIUM 40 MG PO TBEC: 40.0000 mg | DELAYED_RELEASE_TABLET | Freq: Every day | ORAL | Status: DC

## 2023-06-11 NOTE — Plan of Care (Signed)

## 2023-06-11 NOTE — Discharge Summary (Signed)
Physician Discharge Summary  Brandon [name]:[id number] DOB: July 06, 1956 DOA: 06/08/2023  PCP: Everrett Coombe, DO  Admit date: 06/08/2023 Discharge date: 06/11/2023  Admitted From: Home Disposition:  Home  Discharge Condition:Stable CODE STATUS:FULL Diet recommendation: soft diet for next 1-2 days  Brief/Interim Summary: Patient is a 67 year old male with history of hypertension, hyperlipidemia, coronary artery disease, status post PCI, diabetes type 2 on insulin pump, small bowel obstruction, chronic back pain who presented with abdominal pain, diarrhea, vomiting.  On presentation, lab work showed WBC count of 10.9, potassium 3.4.  CT abdomen/pelvis showed diffuse small bowel dilatation suspicious for early or partial obstruction.  General surgery consulted and following. Diet advanced to soft.He is tolerating soft diet diet today. Abd pain better.General surgery cleared for dc  Following problems were addressed during the hospitalization: Partial small bowel obstruction: Has history of some bowel obstruction in the past.  Presented with abdominal pain, nausea, vomiting, diarrhea.  CT abdomen/pelvis showed diffuse small bowel dilation consistent with early/partial obstruction versus ileus.  General surgery following.   Abdominal pain has improved. He is passing gas but no bowel movement.  Walking in the hallways. Started on soft  diet and tolerated.  Abdominal x-ray from 10/15 showed normal gas pattern.   Hypokalemia: Supplemented and corrected   Diabetes type 2: On insulin pump.  Also on Jardiance.  He takes [facility] as an outpatient which diabetic coordinator has recommended to stop.  Last A1c of 6.7   Coronary artery disease: Status post PCI.  Status post DES to the proximal circumflex and mid circumflex.  On Plavix and atorvastatin at home.  Plavix  to be resumed   Anxiety/depression:On  Abilify, Wellbutrin.   Hyperlipidemia: On Lipitor at home   Chronic back pain: On butrans    BPH: On flomax   GERD: On Protonix    Discharge Diagnoses:  Active Problems:   Partial small bowel obstruction (HCC)   Hypokalemia   Diabetes mellitus type 2, uncomplicated, on long term insulin pump (HCC)   Coronary artery disease   Anxiety and depression   Hyperlipidemia associated with type 2 diabetes mellitus (HCC)   Chronic back pain   BPH (benign prostatic hyperplasia)   GERD (gastroesophageal reflux disease)    Discharge Instructions  Discharge Instructions     Diet general   Complete by: As directed    Soft diet for few days until adequate bowel movement   Discharge instructions   Complete by: As directed    1)Please follow up with your PCP in a week 2)We recommend to stop taking Mounjaro.Follow up with your endocrinologist   Increase activity slowly   Complete by: As directed       Allergies as of 06/11/2023       Reactions   Kiwi Extract Swelling   Mouth swelling.   Other Swelling, Other (See Comments)   EGGPLANT. Mouth swelling.   Penicillins Rash   Reaction: unknown   Ancef [cefazolin] Rash   Latex Rash   Levaquin [levofloxacin] Rash   Peach [prunus Persica] Other (See Comments)   Burns mouth        Medication List     STOP taking these medications    Mounjaro 15 MG/0.5ML Pen Generic drug: tirzepatide   polyethylene glycol powder 17 GM/SCOOP powder Commonly known as: GLYCOLAX/MIRALAX Replaced by: polyethylene glycol 17 g packet       TAKE these medications    acetaminophen 500 MG tablet Commonly known as: TYLENOL Take 1,000 mg by mouth See  admin instructions. Take 2 tablets (1000 mg) by mouth scheduled in the morning & may take 2 tablets (1000 mg) by mouth every 6 hours if needed for pain.   albuterol 108 (90 Base) MCG/ACT inhaler Commonly known as: VENTOLIN HFA Inhale 2 puffs into the lungs as needed for wheezing or shortness of breath (When sick).   AMBULATORY NON FORMULARY MEDICATION Please provide home oxygen for  nocturnal use.  Apply 2-4L of O2 via nasal cannula at bedtime.  Dx: Nocturnal Oxygen Desaturation G47.34   AMBULATORY NON FORMULARY MEDICATION Please provide humidifier for home oxygen due to nasal irritation with use.   ARIPiprazole 10 MG tablet Commonly known as: ABILIFY Take 1 tablet (10 mg total) by mouth every morning.   atorvastatin 80 MG tablet Commonly known as: LIPITOR Take 1 tablet (80 mg total) by mouth daily.   b complex vitamins capsule Take 1 capsule by mouth in the morning.   buprenorphine 15 MCG/HR Commonly known as: Butrans Place 1 patch onto the skin once a week.   buPROPion 150 MG 24 hr tablet Commonly known as: Wellbutrin XL Take 1 tablet (150 mg total) by mouth daily in addition to 300mg  tablet.   buPROPion 300 MG 24 hr tablet Commonly known as: WELLBUTRIN XL Take 1 tablet (300 mg total) by mouth daily.   clopidogrel 75 MG tablet Commonly known as: PLAVIX Take 1 tablet (75 mg total) by mouth daily with breakfast.   Dexcom G6 Sensor Misc Use as directed. Change sensor every 10 days   Dexcom G6 Sensor Misc Use as directed. Change sensor every 10 days   FeroSul 325 (65 Fe) MG tablet Generic drug: ferrous sulfate Take 1 tablet (325 mg) by mouth daily.   Gvoke HypoPen 1-Pack 1 MG/0.2ML Soaj Generic drug: Glucagon Use as needed for hypoglycemia   insulin lispro 100 UNIT/ML injection Commonly known as: HumaLOG For use in pump, total of 60 units per day   Jardiance 25 MG Tabs tablet Generic drug: empagliflozin Take 1 tablet (25 mg total) by mouth daily.   magnesium oxide 400 (240 Mg) MG tablet Commonly known as: MAG-OX Take 800 mg by mouth in the morning.   metoCLOPramide 10 MG tablet Commonly known as: REGLAN Take 1 tablet (10 mg total) by mouth daily as needed for nausea or vomiting (upset stomach).   Multi-Vitamin tablet Take 1 tablet by mouth daily.   nitroGLYCERIN 0.4 MG SL tablet Commonly known as: NITROSTAT Place 0.4 mg under the  tongue every 5 (five) minutes x 3 doses as needed for chest pain.   ondansetron 4 MG disintegrating tablet Commonly known as: ZOFRAN-ODT Take 1 tablet (4 mg total) by mouth every 8 (eight) hours as needed for nausea or vomiting.   pantoprazole 40 MG tablet Commonly known as: PROTONIX Take 1 tablet (40 mg total) by mouth daily.   polyethylene glycol 17 g packet Commonly known as: MiraLax Take 17 g by mouth daily. Replaces: polyethylene glycol powder 17 GM/SCOOP powder   Stool Softener 100 MG capsule Generic drug: docusate sodium Take 2 capsules (200 mg total) by mouth in the morning.   tamsulosin 0.4 MG Caps capsule Commonly known as: FLOMAX Take 1 capsule (0.4 mg total) by mouth daily.   testosterone 50 MG/5GM (1%) Gel Commonly known as: ANDROGEL Place 5 g (1 packet) onto the shoulders and upper arms daily as directed.   tiZANidine 4 MG tablet Commonly known as: ZANAFLEX Take 1-1&1/2 tablets (4-6 mg total) by mouth every 8 (eight) hours  as needed for muscle spasms.        Follow-up Information     Everrett Coombe, DO. Schedule an appointment as soon as possible for a visit in 1 week(s).   Specialty: Family Medicine Contact information: [address]  [address] [employer] [place] [address] [phone]                Allergies  Allergen Reactions   Kiwi Extract Swelling    Mouth swelling.    Other Swelling and Other (See Comments)    EGGPLANT. Mouth swelling.   Penicillins Rash    Reaction: unknown   Ancef [Cefazolin] Rash   Latex Rash   Levaquin [Levofloxacin] Rash   Peach [Prunus Persica] Other (See Comments)    Burns mouth    Consultations: Surgery   Procedures/Studies: DG Abd Portable 1V  Result Date: 06/10/2023 CLINICAL DATA:  Diarrhea and vomiting for 3 days. EXAM: PORTABLE ABDOMEN - 1 VIEW COMPARISON:  July 13, 2022. FINDINGS: The bowel gas pattern is normal. Stimulator device is seen over left side of abdomen. Status post  surgical fusion of lower lumbar spine. IMPRESSION: No abnormal bowel dilatation. Electronically Signed   By: Lupita Raider M.D.   On: 06/10/2023 13:21   CT ABDOMEN PELVIS W CONTRAST  Result Date: 06/08/2023 CLINICAL DATA:  Diarrhea and vomiting.  Epigastric pain. EXAM: CT ABDOMEN AND PELVIS WITH CONTRAST TECHNIQUE: Multidetector CT imaging of the abdomen and pelvis was performed using the standard protocol following bolus administration of intravenous contrast. RADIATION DOSE REDUCTION: This exam was performed according to the departmental dose-optimization program which includes automated exposure control, adjustment of the mA and/or kV according to patient size and/or use of iterative reconstruction technique. CONTRAST:  OMNIPAQUE IOHEXOL 300 MG/ML  SOLN COMPARISON:  07/12/2022. FINDINGS: Lower chest: Linear subsegmental atelectasis or scarring left base. Trace left pleural effusion or thickening. No pericardial effusion. Hepatobiliary: No focal liver abnormality is seen. Status post cholecystectomy. No biliary dilatation. Pancreas: Unremarkable. No pancreatic ductal dilatation or surrounding inflammatory changes. Spleen: Normal in size without focal abnormality. Adrenals/Urinary Tract: No suspicious adrenal lesions. No hydronephrosis or nephrolithiasis. Right kidney upper pole cyst measures 3 cm. Unremarkable urinary bladder. Stomach/Bowel: Stomach is mildly distended and there is diffuse small bowel dilatation with a change to normal caliber prior to the terminal ileum. No discrete transition point is appreciated. There is mucosal thickening identified in the cecum and proximal ascending colon raising concern for a mucosal process resulting in partial small bowel obstruction. This may need to be evaluated endoscopically if this finding persists on follow up. There is a possible 4 cm mucosal lesion at the base of the appendix representing an interval change compared to the prior study. This could be  an retained stool as well. Appendix is unremarkable. Diverticulosis of the descending and sigmoid. Vascular/Lymphatic: Aortic atherosclerosis. No enlarged abdominal or pelvic lymph nodes. Reproductive: Prostate is unremarkable. Penile prosthesis identified with reservoir in the left anterior lower abdomen. Other: There is a tiny anterior abdominal wall defect to the right of midline with some herniated omental fat. Small bilateral inguinal hernias identified containing fat. Small amount of pelvic free fluid noted. Musculoskeletal: Thoracolumbosacral degenerative changes. Status post posterior fusion and discectomy L4-5. electronic spine stimulation device identified in the left midabdomen posteriorly with wires extending to the lower thoracic spine. IMPRESSION: 1. Diffuse small bowel dilatation consistent with early or partial obstruction versus ileus. 2. Indeterminate plaque-like area of mucosal thickening versus retained stool in the right  colon. Follow up recommended to exclude mucosal process. Endoscopic correlation may be necessary. 3. Diverticulosis. 4. Tiny right-sided spigelian hernia and bilateral small inguinal hernias that contain omental fat. 5. Small amount of free fluid in the pelvis. 6. Right kidney cyst. Electronically Signed   By: Layla Maw M.D.   On: 06/08/2023 10:18      Subjective: Comfortable.Abd discomfort better .Passing gas  Discharge Exam: Vitals:   06/11/23 0500 06/11/23 0743  BP: (!) 142/74 124/69  Pulse: 73 68  Resp: 19 16  Temp: 98.4 F (36.9 C) 98.4 F (36.9 C)  SpO2: 100% 98%   Vitals:   06/10/23 1618 06/10/23 2100 06/11/23 0500 06/11/23 0743  BP: 125/68 129/65 (!) 142/74 124/69  Pulse: 80 74 73 68  Resp:  19 19 16   Temp: 98.3 F (36.8 C) 97.8 F (36.6 C) 98.4 F (36.9 C) 98.4 F (36.9 C)  TempSrc: Oral Oral Oral Oral  SpO2: 100% 97% 100% 98%  Weight:      Height:        General: Pt is alert, awake, not in acute distress Cardiovascular: RRR,  S1/S2 +, no rubs, no gallops Respiratory: CTA bilaterally, no wheezing, no rhonchi Abdominal: Soft,mildly distended, bowel sounds + Extremities: no edema, no cyanosis    The results of significant diagnostics from this hospitalization (including imaging, microbiology, ancillary and laboratory) are listed below for reference.     Microbiology: No results found for this or any previous visit (from the past 240 hour(s)).   Labs: BNP (last 3 results) No results for input(s): "BNP" in the last 8760 hours. Basic Metabolic Panel: Recent Labs  Lab 06/08/23 0749 06/09/23 0807 06/10/23 0750 06/11/23 0839  NA 137 139 139 139  K 3.4* 3.4* 3.3* 3.9  CL 98 104 103 101  CO2 24 27 28 28   GLUCOSE 192* 108* 110* 108*  BUN 11 9 <5* <5*  CREATININE 0.77 0.93 0.78 0.79  CALCIUM 9.0 8.4* 8.6* 9.1   Liver Function Tests: Recent Labs  Lab 06/08/23 0749  AST 24  ALT 15  ALKPHOS 82  BILITOT 1.5*  PROT 7.8  ALBUMIN 4.3   Recent Labs  Lab 06/08/23 0749  LIPASE 36   No results for input(s): "AMMONIA" in the last 168 hours. CBC: Recent Labs  Lab 06/08/23 0749 06/09/23 0807  WBC 10.9* 5.5  HGB 16.9 14.0  HCT 48.0 41.8  MCV 92.1 94.4  PLT 187 172   Cardiac Enzymes: No results for input(s): "CKTOTAL", "CKMB", "CKMBINDEX", "TROPONINI" in the last 168 hours. BNP: Invalid input(s): "POCBNP" CBG: Recent Labs  Lab 06/10/23 2125 06/11/23 0212 06/11/23 0624 06/11/23 0745 06/11/23 1153  GLUCAP 97 76 111* 102* 147*   D-Dimer No results for input(s): "DDIMER" in the last 72 hours. Hgb A1c No results for input(s): "HGBA1C" in the last 72 hours. Lipid Profile No results for input(s): "CHOL", "HDL", "LDLCALC", "TRIG", "CHOLHDL", "LDLDIRECT" in the last 72 hours. Thyroid function studies No results for input(s): "TSH", "T4TOTAL", "T3FREE", "THYROIDAB" in the last 72 hours.  Invalid input(s): "FREET3" Anemia work up No results for input(s): "VITAMINB12", "FOLATE", "FERRITIN",  "TIBC", "IRON", "RETICCTPCT" in the last 72 hours. Urinalysis    Component Value Date/Time   COLORURINE AMBER (A) 06/08/2023 0839   APPEARANCEUR CLEAR 06/08/2023 0839   LABSPEC >=1.030 06/08/2023 0839   PHURINE 5.5 06/08/2023 0839   GLUCOSEU >=500 (A) 06/08/2023 0839   HGBUR TRACE (A) 06/08/2023 0839   BILIRUBINUR SMALL (A) 06/08/2023 0839   BILIRUBINUR negative  06/22/2020 1150   KETONESUR 40 (A) 06/08/2023 0839   PROTEINUR >=300 (A) 06/08/2023 0839   UROBILINOGEN 0.2 06/22/2020 1150   NITRITE NEGATIVE 06/08/2023 0839   LEUKOCYTESUR NEGATIVE 06/08/2023 0839   Sepsis Labs Recent Labs  Lab 06/08/23 0749 06/09/23 0807  WBC 10.9* 5.5   Microbiology No results found for this or any previous visit (from the past 240 hour(s)).  Please note: You were cared for by a hospitalist during your hospital stay. Once you are discharged, your primary care physician will handle any further medical issues. Please note that NO REFILLS for any discharge medications will be authorized once you are discharged, as it is imperative that you return to your primary care physician (or establish a relationship with a primary care physician if you do not have one) for your post hospital discharge needs so that they can reassess your need for medications and monitor your lab values.    Time coordinating discharge: 40 minutes  SIGNED:   Burnadette Pop, MD  [facility] 06/11/2023, 2:18 PM Pager [phone]  If 7PM-7AM, please contact night-coverage [email or web address] Password [email or web address]

## 2023-06-11 NOTE — Progress Notes (Signed)
PROGRESS NOTE  Brandon [name]  ZDG:[id number] DOB: 1956/06/07 DOA: 06/08/2023 PCP: Everrett Coombe, DO   Brief Narrative: Patient is a 67 year old male with history of hypertension, hyperlipidemia, coronary artery disease, status post PCI, diabetes type 2 on insulin pump, small bowel obstruction, chronic back pain who presented with abdominal pain, diarrhea, vomiting.  On presentation, lab work showed WBC count of 10.9, potassium 3.4.  CT abdomen/pelvis showed diffuse small bowel dilatation suspicious for early or partial obstruction.  General surgery consulted and following. Diet advanced to soft.Waiting for BM  Assessment & Plan:  Active Problems:   Partial small bowel obstruction (HCC)   Hypokalemia   Diabetes mellitus type 2, uncomplicated, on long term insulin pump (HCC)   Coronary artery disease   Anxiety and depression   Hyperlipidemia associated with type 2 diabetes mellitus (HCC)   Chronic back pain   BPH (benign prostatic hyperplasia)   GERD (gastroesophageal reflux disease)   Partial small bowel obstruction: Has history of some bowel obstruction in the past.  Presented with abdominal pain, nausea, vomiting, diarrhea.  CT abdomen/pelvis showed diffuse small bowel dilation consistent with early/partial obstruction versus ileus.  General surgery following.   Abdominal pain has improved but not entirely gone.  He is passing gas but no bowel movement.  Walking in the hallways. Started on soft  diet.  Abdominal x-ray from 10/15 showed normal gas pattern  Hypokalemia: Supplemented  Diabetes type 2: On insulin pump.  Also on Jardiance.  He takes [facility] as an outpatient which diabetic coordinator has recommended to stop.  Last A1c of 6.7  Coronary artery disease: Status post PCI.  Status post DES to the proximal circumflex and mid circumflex.  On Plavix and atorvastatin at home.  Plavix  to be resumed  Anxiety/depression:On  Abilify, Wellbutrin.  Hyperlipidemia: On Lipitor at  home  Chronic back pain: On butrans  BPH: On flomax  GERD: On IV Protonix         DVT prophylaxis:enoxaparin (LOVENOX) injection 40 mg Start: 06/08/23 1500     Code Status: Full Code  Family Communication: Wife at bedside on 10/14  Patient status:Inpatient  Patient is from :Home  Anticipated discharge RJ:JOAC  Estimated DC date:after resolution of SBO   Consultants: General Surgery  Procedures: None yet  Antimicrobials:  Anti-infectives (From admission, onward)    None       Subjective: Seen and examined at bed side. Complains of some abd fullness with some discomfort  but better than yesterday.No BM,passing gas,tolerating full liquid  Objective: Vitals:   06/10/23 1618 06/10/23 2100 06/11/23 0500 06/11/23 0743  BP: 125/68 129/65 (!) 142/74 124/69  Pulse: 80 74 73 68  Resp:  19 19 16   Temp: 98.3 F (36.8 C) 97.8 F (36.6 C) 98.4 F (36.9 C) 98.4 F (36.9 C)  TempSrc: Oral Oral Oral Oral  SpO2: 100% 97% 100% 98%  Weight:      Height:       No intake or output data in the 24 hours ending 06/11/23 1410  Filed Weights   06/08/23 0726  Weight: 88 kg    Examination:    General exam: Overall comfortable, not in distress HEENT: PERRL Respiratory system:  no wheezes or crackles  Cardiovascular system: S1 & S2 heard, RRR.  Gastrointestinal system: Abdomen is mildly distended, soft and and has some generalized mild tenderness,bowel sounds present Central nervous system: Alert and oriented Extremities: No edema, no clubbing ,no cyanosis Skin: No rashes, no ulcers,no icterus  Data Reviewed: I have personally reviewed following labs and imaging studies  CBC: Recent Labs  Lab 06/08/23 0749 06/09/23 0807  WBC 10.9* 5.5  HGB 16.9 14.0  HCT 48.0 41.8  MCV 92.1 94.4  PLT 187 172   Basic Metabolic Panel: Recent Labs  Lab 06/08/23 0749 06/09/23 0807 06/10/23 0750 06/11/23 0839  NA 137 139 139 139  K 3.4* 3.4* 3.3* 3.9  CL 98 104 103  101  CO2 24 27 28 28   GLUCOSE 192* 108* 110* 108*  BUN 11 9 <5* <5*  CREATININE 0.77 0.93 0.78 0.79  CALCIUM 9.0 8.4* 8.6* 9.1     No results found for this or any previous visit (from the past 240 hour(s)).   Radiology Studies: DG Abd Portable 1V  Result Date: 06/10/2023 CLINICAL DATA:  Diarrhea and vomiting for 3 days. EXAM: PORTABLE ABDOMEN - 1 VIEW COMPARISON:  July 13, 2022. FINDINGS: The bowel gas pattern is normal. Stimulator device is seen over left side of abdomen. Status post surgical fusion of lower lumbar spine. IMPRESSION: No abnormal bowel dilatation. Electronically Signed   By: Lupita Raider M.D.   On: 06/10/2023 13:21    Scheduled Meds:  ARIPiprazole  10 mg Oral Daily   atorvastatin  80 mg Oral Daily   buprenorphine  2 patch Transdermal Weekly   buPROPion  150 mg Oral Daily   buPROPion  300 mg Oral Daily   clopidogrel  75 mg Oral Daily   empagliflozin  25 mg Oral Daily   enoxaparin (LOVENOX) injection  40 mg Subcutaneous Q24H   insulin pump   Subcutaneous TID WC, HS, 0200   pantoprazole  40 mg Oral Daily   scopolamine  1 patch Transdermal Q72H   sodium chloride flush  3 mL Intravenous Q12H   tamsulosin  0.4 mg Oral Daily   Continuous Infusions:     LOS: 3 days   Burnadette Pop, MD [facility] P10/16/2024, 2:10 PM

## 2023-06-11 NOTE — Progress Notes (Signed)
Subjective: Tolerating FLD very well.  No nausea.  Still mildly tender and feels a little bloated.  Passing flatus.  No BM  ROS: See above, otherwise other systems negative  Objective: Vital signs in last 24 hours: Temp:  [97.8 F (36.6 C)-98.4 F (36.9 C)] 98.4 F (36.9 C) (10/16 0743) Pulse Rate:  [68-80] 68 (10/16 0743) Resp:  [16-19] 16 (10/16 0743) BP: (124-142)/(65-74) 124/69 (10/16 0743) SpO2:  [97 %-100 %] 98 % (10/16 0743) Last BM Date : 06/10/23  Intake/Output from previous day: No intake/output data recorded. Intake/Output this shift: No intake/output data recorded.  PE: Gen: NAD Abd: soft, mild bloating +BS, mild diffuse tenderness, mostly in central abdomen, but minimal  Lab Results:  Recent Labs    06/09/23 0807  WBC 5.5  HGB 14.0  HCT 41.8  PLT 172   BMET Recent Labs    06/10/23 0750 06/11/23 0839  NA 139 139  K 3.3* 3.9  CL 103 101  CO2 28 28  GLUCOSE 110* 108*  BUN <5* <5*  CREATININE 0.78 0.79  CALCIUM 8.6* 9.1   PT/INR No results for input(s): "LABPROT", "INR" in the last 72 hours. CMP     Component Value Date/Time   NA 139 06/11/2023 0839   NA 140 03/27/2023 1057   K 3.9 06/11/2023 0839   CL 101 06/11/2023 0839   CO2 28 06/11/2023 0839   GLUCOSE 108 (H) 06/11/2023 0839   BUN <5 (L) 06/11/2023 0839   BUN 7 (L) 03/27/2023 1057   CREATININE 0.79 06/11/2023 0839   CREATININE 0.75 09/26/2022 1042   CALCIUM 9.1 06/11/2023 0839   PROT 7.8 06/08/2023 0749   PROT 6.8 03/27/2023 1057   ALBUMIN 4.3 06/08/2023 0749   ALBUMIN 4.5 03/27/2023 1057   AST 24 06/08/2023 0749   ALT 15 06/08/2023 0749   ALKPHOS 82 06/08/2023 0749   BILITOT 1.5 (H) 06/08/2023 0749   BILITOT 0.7 03/27/2023 1057   GFRNONAA >60 06/11/2023 0839   GFRAA >60 04/17/2020 0510   Lipase     Component Value Date/Time   LIPASE 36 06/08/2023 0749       Studies/Results: DG Abd Portable 1V  Result Date: 06/10/2023 CLINICAL DATA:  Diarrhea and  vomiting for 3 days. EXAM: PORTABLE ABDOMEN - 1 VIEW COMPARISON:  July 13, 2022. FINDINGS: The bowel gas pattern is normal. Stimulator device is seen over left side of abdomen. Status post surgical fusion of lower lumbar spine. IMPRESSION: No abnormal bowel dilatation. Electronically Signed   By: Lupita Raider M.D.   On: 06/10/2023 13:21    Anti-infectives: Anti-infectives (From admission, onward)    None        Assessment/Plan Abdominal pain, possible early or partial SBO vs enteritis -numerous previous episodes similar to this, but was having diarrhea prior to this episode this time along with N/V.  Could be some component of viral enteritis as well -tolerated FLD with no issues or nausea. -adv to soft diet.  If he tolerates this he is surgically stable for DC home when felt medically stable.  We will be available as needed. -d/w primary service on the ward  FEN - soft VTE - may resume plavix ID - none currently  needed  HTN HLD CAD on plavix DM Chronic back pain  I reviewed hospitalist notes, last 24 h vitals and pain scores, last 48 h intake and output, last 24 h labs and trends, and last 24 h imaging results.  LOS: 3 days    Letha Cape , [facility] Surgery 06/11/2023, 9:54 AM Please see Amion for pager number during day hours 7:00am-4:30pm or 7:00am -11:30am on weekends

## 2023-06-13 ENCOUNTER — Other Ambulatory Visit (HOSPITAL_BASED_OUTPATIENT_CLINIC_OR_DEPARTMENT_OTHER): Payer: Self-pay

## 2023-06-17 ENCOUNTER — Ambulatory Visit (INDEPENDENT_AMBULATORY_CARE_PROVIDER_SITE_OTHER): Payer: 59 | Admitting: Family Medicine

## 2023-06-17 ENCOUNTER — Encounter: Payer: Self-pay | Admitting: Family Medicine

## 2023-06-17 VITALS — BP 125/71 | HR 79 | Resp 20 | Ht 68.0 in | Wt 200.0 lb

## 2023-06-17 DIAGNOSIS — I10 Essential (primary) hypertension: Secondary | ICD-10-CM

## 2023-06-17 DIAGNOSIS — K56609 Unspecified intestinal obstruction, unspecified as to partial versus complete obstruction: Secondary | ICD-10-CM

## 2023-06-17 NOTE — Progress Notes (Signed)
Brandon [name] - 67 y.o. male MRN [id number]  Date of birth: 08-15-1956  Subjective Chief Complaint  Patient presents with   Hospitalization Follow-up    HPI Brandon [name] is a 67 year old male here today for hospital follow-up.  Recently hospitalized due to small bowel obstruction.  He reports that he was told that he likely had combination of gastroenteritis as well as use of GLP-1 that contributed to his bowel obstruction.  He did not require NG tube.  He reports that his appetite is starting to improve.  Minimal abdominal pain.  Denies nausea or vomiting.  Still with little bit of bloating.  He is passing gas.  ROS:  A comprehensive ROS was completed and negative except as noted per HPI  Allergies  Allergen Reactions   Kiwi Extract Swelling    Mouth swelling.    Other Swelling and Other (See Comments)    EGGPLANT. Mouth swelling.   Penicillins Rash    Reaction: unknown   Ancef [Cefazolin] Rash   Latex Rash   Levaquin [Levofloxacin] Rash   Peach [Prunus Persica] Other (See Comments)    Burns mouth    Past Medical History:  Diagnosis Date   Anxiety    Arthritis    Basal cell carcinoma (BCC) in situ of skin    left neck   Chronic back pain    has spinal cord stimulator and wears buprenorphine patch   Colon polyps    Coronary artery disease    S/p DES to mRCA in 12/21 // S/p DES to LCx x 2 in 12/2020 // Diff dz in small to mod Dx (not a good target for PCI); OM1 100 CTO; severe dz in small OM2 and PDA >> Med Rx   Depression    Diabetes mellitus without complication (HCC)    type 2   Fibromyalgia    Gallstones    GERD (gastroesophageal reflux disease)    Hepatitis B    HLD (hyperlipidemia)    Hypertension    Osteoarthritis    Pneumonia    PONV (postoperative nausea and vomiting)    SBO (small bowel obstruction) (HCC)    Sleep apnea    does not use CPAP    Past Surgical History:  Procedure Laterality Date   CARDIAC CATHETERIZATION  01/17/2021   CATARACT  EXTRACTION W/ INTRAOCULAR LENS  IMPLANT, BILATERAL     CHOLECYSTECTOMY     CHOLECYSTECTOMY, LAPAROSCOPIC     COLONOSCOPY WITH PROPOFOL N/A 12/19/2022   Procedure: COLONOSCOPY WITH PROPOFOL;  Surgeon: Jenel Lucks, MD;  Location: [facility] ENDOSCOPY;  Service: Gastroenterology;  Laterality: N/A;   CORONARY STENT INTERVENTION N/A 08/15/2020   Procedure: CORONARY STENT INTERVENTION;  Surgeon: Corky Crafts, MD;  Location: [facility] INVASIVE CV LAB;  Service: Cardiovascular;  Laterality: N/A;   CORONARY STENT INTERVENTION N/A 01/17/2021   Procedure: CORONARY STENT INTERVENTION;  Surgeon: Kathleene Hazel, MD;  Location: [facility] INVASIVE CV LAB;  Service: Cardiovascular;  Laterality: N/A;   CORONARY ULTRASOUND/IVUS N/A 08/15/2020   Procedure: Intravascular Ultrasound/IVUS;  Surgeon: Corky Crafts, MD;  Location: [facility] INVASIVE CV LAB;  Service: Cardiovascular;  Laterality: N/A;   DRUG INDUCED ENDOSCOPY N/A 05/23/2022   Procedure: DRUG INDUCED SLEEP ENDOSCOPY;  Surgeon: Osborn Coho, MD;  Location: [facility] OR;  Service: ENT;  Laterality: N/A;   HERNIA REPAIR     IMPLANTATION OF HYPOGLOSSAL NERVE STIMULATOR Right 07/30/2022   Procedure: IMPLANTATION OF HYPOGLOSSAL NERVE STIMULATOR;  Surgeon: Osborn Coho, MD;  Location: [facility];  Service: ENT;  Laterality: Right;   LEFT HEART CATH AND CORONARY ANGIOGRAPHY N/A 08/15/2020   Procedure: LEFT HEART CATH AND CORONARY ANGIOGRAPHY;  Surgeon: Corky Crafts, MD;  Location: [facility] INVASIVE CV LAB;  Service: Cardiovascular;  Laterality: N/A;   LEFT HEART CATH AND CORONARY ANGIOGRAPHY N/A 01/17/2021   Procedure: LEFT HEART CATH AND CORONARY ANGIOGRAPHY;  Surgeon: Kathleene Hazel, MD;  Location: [facility] INVASIVE CV LAB;  Service: Cardiovascular;  Laterality: N/A;   LUMBAR FUSION     L3-5 with rods   MICRODISCECTOMY LUMBAR  2006   POLYPECTOMY  12/19/2022   Procedure: POLYPECTOMY;  Surgeon: Jenel Lucks, MD;  Location: [facility]  ENDOSCOPY;  Service: Gastroenterology;;   SPINAL CORD STIMULATOR INSERTION     blood clot removed from spinal cord   SPINE SURGERY     blood clot removed from spinal cord   XI ROBOTIC ASSISTED INGUINAL HERNIA REPAIR WITH MESH     x 3 surgeries    Social History   Socioeconomic History   Marital status: Married    Spouse name: Not on file   Number of children: 4   Years of education: 16   Highest education level: Not on file  Occupational History   Occupation: disabled/ respiratory therapist  Tobacco Use   Smoking status: Former    Current packs/day: 0.00    Average packs/day: 1 pack/day for 30.0 years (30.0 ttl pk-yrs)    Types: Cigarettes    Start date: 25    Quit date: 2007    Years since quitting: 17.8   Smokeless tobacco: Never  Vaping Use   Vaping status: Never Used  Substance and Sexual Activity   Alcohol use: Not Currently   Drug use: Not Currently   Sexual activity: Yes  Other Topics Concern   Not on file  Social History Narrative   ** Merged History Encounter **       Social Determinants of Health   Financial Resource Strain: Not on file  Food Insecurity: No Food Insecurity (06/08/2023)   Hunger Vital Sign    Worried About Running Out of Food in the Last Year: Never true    Ran Out of Food in the Last Year: Never true  Transportation Needs: No Transportation Needs (06/08/2023)   PRAPARE - Administrator, Civil Service (Medical): No    Lack of Transportation (Non-Medical): No  Physical Activity: Not on file  Stress: Not on file  Social Connections: Not on file    Family History  Problem Relation Age of Onset   COPD Mother    Anxiety disorder Mother    Depression Mother    Colon cancer Mother    Cancer Father        mets, origin unknown   Anxiety disorder Father    Depression Father    Coronary artery disease Father    CAD Brother    Leukemia Maternal Grandmother    Multiple sclerosis Daughter    Diabetes Neg Hx     Health  Maintenance  Topic Date Due   Medicare Annual Wellness (AWV)  Never done   Zoster Vaccines- Shingrix (1 of 2) 06/27/2023 (Originally 09/13/2005)   COVID-19 Vaccine (3 - 2023-24 season) 07/03/2023 (Originally 04/27/2023)   Hepatitis C Screening  03/26/2024 (Originally 09/13/1973)   HEMOGLOBIN A1C  09/27/2023   OPHTHALMOLOGY EXAM  12/04/2023   Diabetic kidney evaluation - Urine ACR  03/26/2024   FOOT EXAM  05/20/2024   Diabetic kidney evaluation -  eGFR measurement  06/16/2024   Colonoscopy  12/18/2032   DTaP/Tdap/Td (2 - Td or Tdap) 03/26/2033   Pneumonia Vaccine 85+ Years old  Completed   INFLUENZA VACCINE  Completed   HPV VACCINES  Aged Out     ----------------------------------------------------------------------------------------------------------------------------------------------------------------------------------------------------------------- Physical Exam BP 125/71 (BP Location: Left Arm, Cuff Size: Normal)   Pulse 79   Resp 20   Ht 5\' 8"  (1.727 m)   Wt 200 lb 0.6 oz (90.7 kg)   SpO2 97%   BMI 30.42 kg/m   Physical Exam Constitutional:      Appearance: Normal appearance.  Eyes:     General: No scleral icterus. Cardiovascular:     Rate and Rhythm: Normal rate and regular rhythm.  Pulmonary:     Effort: Pulmonary effort is normal.     Breath sounds: Normal breath sounds.  Abdominal:     General: There is no distension.     Palpations: Abdomen is soft.     Tenderness: There is no abdominal tenderness.  Neurological:     Mental Status: He is alert.  Psychiatric:        Mood and Affect: Mood normal.        Behavior: Behavior normal.     ------------------------------------------------------------------------------------------------------------------------------------------------------------------------------------------------------------------- Assessment and Plan  Small bowel obstruction (HCC) He will continue to hold GLP-1 until follow-up with endocrinology.   Encouraged to stay well-hydrated.   No orders of the defined types were placed in this encounter.   No follow-ups on file.    This visit occurred during the SARS-CoV-2 public health emergency.  Safety protocols were in place, including screening questions prior to the visit, additional usage of staff PPE, and extensive cleaning of exam room while observing appropriate contact time as indicated for disinfecting solutions.

## 2023-06-18 LAB — CMP14+EGFR
ALT: 13 [IU]/L (ref 0–44)
AST: 20 [IU]/L (ref 0–40)
Albumin: 4.2 g/dL (ref 3.9–4.9)
Alkaline Phosphatase: 87 [IU]/L (ref 44–121)
BUN/Creatinine Ratio: 8 — ABNORMAL LOW (ref 10–24)
BUN: 6 mg/dL — ABNORMAL LOW (ref 8–27)
Bilirubin Total: 0.6 mg/dL (ref 0.0–1.2)
CO2: 27 mmol/L (ref 20–29)
Calcium: 8.5 mg/dL — ABNORMAL LOW (ref 8.6–10.2)
Chloride: 99 mmol/L (ref 96–106)
Creatinine, Ser: 0.72 mg/dL — ABNORMAL LOW (ref 0.76–1.27)
Globulin, Total: 2.4 g/dL (ref 1.5–4.5)
Glucose: 143 mg/dL — ABNORMAL HIGH (ref 70–99)
Potassium: 3.9 mmol/L (ref 3.5–5.2)
Sodium: 141 mmol/L (ref 134–144)
Total Protein: 6.6 g/dL (ref 6.0–8.5)
eGFR: 100 mL/min/{1.73_m2} (ref >59)

## 2023-06-18 LAB — CBC WITH DIFFERENTIAL/PLATELET
Basophils Absolute: 0 10*3/uL (ref 0.0–0.2)
Basos: 1 %
EOS (ABSOLUTE): 0.1 10*3/uL (ref 0.0–0.4)
Eos: 2 %
Hematocrit: 42.1 % (ref 37.5–51.0)
Hemoglobin: 14.4 g/dL (ref 13.0–17.7)
Immature Grans (Abs): 0 10*3/uL (ref 0.0–0.1)
Immature Granulocytes: 0 %
Lymphocytes Absolute: 1.1 10*3/uL (ref 0.7–3.1)
Lymphs: 22 %
MCH: 32.7 pg (ref 26.6–33.0)
MCHC: 34.2 g/dL (ref 31.5–35.7)
MCV: 96 fL (ref 79–97)
Monocytes Absolute: 0.6 10*3/uL (ref 0.1–0.9)
Monocytes: 12 %
Neutrophils Absolute: 3.1 10*3/uL (ref 1.4–7.0)
Neutrophils: 63 %
Platelets: 240 10*3/uL (ref 150–450)
RBC: 4.41 x10E6/uL (ref 4.14–5.80)
RDW: 12.1 % (ref 11.6–15.4)
WBC: 4.9 10*3/uL (ref 3.4–10.8)

## 2023-06-18 NOTE — Progress Notes (Deleted)
Date:  06/18/2023   ID:  Brandon [name], DOB 17-Dec-1955, MRN [id number] The patient was identified using 2 identifiers. PCP:  Everrett Coombe, DO   [facility] Providers Cardiologist:  Orbie Pyo, MD Cardiology APP:  Beatrice Lecher, PA-C  Sleep Medicine:  Armanda Magic, MD     Evaluation Performed:  Follow-Up Visit Chief Complaint:  OSA  History of Present Illness:    Brandon [name] is a 67 y.o. male with  a hx of CAD, hypertension, diabetes who was referred for sleep study testing by Dr. Shari Prows.  He has a history of obstructive sleep apnea the past but stopped using his CPAP and was referred back for repeat sleep study to get back on CPAP therapy.  He underwent split-night sleep study in June 2022 which revealed severe obstructive sleep apnea with an AHI of 45.7/h and O2 saturations as low as 54% consistent with nocturnal hypoxemia.  He failed CPAP therapy due to ongoing events and underwent BiPAP titration and was placed on auto BiPAP with IPAP max 20 cm H2O, EPAP min 5 cm H2O and pressure support 4 cm H2O   He really struggled with auto BiPAP device.  He was having problems waking up about 4 hours after starting to sleep because his mask was leaking and he would take it off.  He was having significant problems with the mask really leaking and so we ordered him new PAP supplies including a new nasal cushion with chin strap as he had not changed his cushion out since he got his device.     He was referred to ENT and ultimately underwent Inspire device implant 07/30/22 by Dr. Annalee Genta.  He was seen 09/04/2022 and his inspire device was activated.  His amplitude was set at 1.2 V with a control of 1.2 to 2.2 V.   Hide office visit 02/13/2023 he was not feeling rested when he would get it up in the am and then would get sleepy during the day as well.  He was  on O2 at 3L in addition to his Inspire.  He also was having a little bit of tongue soreness at the base of his tongue when he  would wake up but when away by lunch.    He underwent inspire titration in the sleep lab on 03/18/2023  but unfortunately an optimal inspire amplitude could not be obtained due to ongoing respiratory events and severe oxygen desaturations as low as 72% with moderate snoring.    At last OV 05/08/2023 device was interrogated and Different electrode configurations were tested both sitting and supine.  Best electrode configuration appeared to be configure C (-0-).  Different outputs were tested with maximum output 1.4 V.  The device was programmed to a range of 1.0 to 1.4 V.  He was started at level 2 (1.1 V) and instructed to stay at this level for at least a week and then slowly try to increase the level to at least level 4.  His sleep onset time was taken out from 30 minutes to 45 minutes.  He is back today 06/18/2023 for followup Past Medical History:  Diagnosis Date   Anxiety    Arthritis    Basal cell carcinoma (BCC) in situ of skin    left neck   Chronic back pain    has spinal cord stimulator and wears buprenorphine patch   Colon polyps    Coronary artery disease    S/p DES to  mRCA in 12/21 // S/p DES to LCx x 2 in 12/2020 // Diff dz in small to mod Dx (not a good target for PCI); OM1 100 CTO; severe dz in small OM2 and PDA >> Med Rx   Depression    Diabetes mellitus without complication (HCC)    type 2   Fibromyalgia    Gallstones    GERD (gastroesophageal reflux disease)    Hepatitis B    HLD (hyperlipidemia)    Hypertension    Osteoarthritis    Pneumonia    PONV (postoperative nausea and vomiting)    SBO (small bowel obstruction) (HCC)    Sleep apnea    does not use CPAP   Past Surgical History:  Procedure Laterality Date   CARDIAC CATHETERIZATION  01/17/2021   CATARACT EXTRACTION W/ INTRAOCULAR LENS  IMPLANT, BILATERAL     CHOLECYSTECTOMY     CHOLECYSTECTOMY, LAPAROSCOPIC     COLONOSCOPY WITH PROPOFOL N/A 12/19/2022   Procedure: COLONOSCOPY WITH PROPOFOL;  Surgeon:  Jenel Lucks, MD;  Location: [facility] ENDOSCOPY;  Service: Gastroenterology;  Laterality: N/A;   CORONARY STENT INTERVENTION N/A 08/15/2020   Procedure: CORONARY STENT INTERVENTION;  Surgeon: Corky Crafts, MD;  Location: [facility] INVASIVE CV LAB;  Service: Cardiovascular;  Laterality: N/A;   CORONARY STENT INTERVENTION N/A 01/17/2021   Procedure: CORONARY STENT INTERVENTION;  Surgeon: Kathleene Hazel, MD;  Location: [facility] INVASIVE CV LAB;  Service: Cardiovascular;  Laterality: N/A;   CORONARY ULTRASOUND/IVUS N/A 08/15/2020   Procedure: Intravascular Ultrasound/IVUS;  Surgeon: Corky Crafts, MD;  Location: [facility] INVASIVE CV LAB;  Service: Cardiovascular;  Laterality: N/A;   DRUG INDUCED ENDOSCOPY N/A 05/23/2022   Procedure: DRUG INDUCED SLEEP ENDOSCOPY;  Surgeon: Osborn Coho, MD;  Location: [facility] OR;  Service: ENT;  Laterality: N/A;   HERNIA REPAIR     IMPLANTATION OF HYPOGLOSSAL NERVE STIMULATOR Right 07/30/2022   Procedure: IMPLANTATION OF HYPOGLOSSAL NERVE STIMULATOR;  Surgeon: Osborn Coho, MD;  Location: [facility];  Service: ENT;  Laterality: Right;   LEFT HEART CATH AND CORONARY ANGIOGRAPHY N/A 08/15/2020   Procedure: LEFT HEART CATH AND CORONARY ANGIOGRAPHY;  Surgeon: Corky Crafts, MD;  Location: [facility] INVASIVE CV LAB;  Service: Cardiovascular;  Laterality: N/A;   LEFT HEART CATH AND CORONARY ANGIOGRAPHY N/A 01/17/2021   Procedure: LEFT HEART CATH AND CORONARY ANGIOGRAPHY;  Surgeon: Kathleene Hazel, MD;  Location: [facility] INVASIVE CV LAB;  Service: Cardiovascular;  Laterality: N/A;   LUMBAR FUSION     L3-5 with rods   MICRODISCECTOMY LUMBAR  2006   POLYPECTOMY  12/19/2022   Procedure: POLYPECTOMY;  Surgeon: Jenel Lucks, MD;  Location: [facility] ENDOSCOPY;  Service: Gastroenterology;;   SPINAL CORD STIMULATOR INSERTION     blood clot removed from spinal cord   SPINE SURGERY     blood clot removed from spinal cord   XI ROBOTIC ASSISTED INGUINAL  HERNIA REPAIR WITH MESH     x 3 surgeries     No outpatient medications have been marked as taking for the 06/19/23 encounter (Appointment) with Quintella Reichert, MD.     Allergies:   Kiwi extract, Other, Penicillins, Ancef [cefazolin], Latex, Levaquin [levofloxacin], and Peach [prunus persica]   Social History   Tobacco Use   Smoking status: Former    Current packs/day: 0.00    Average packs/day: 1 pack/day for 30.0 years (30.0 ttl pk-yrs)    Types: Cigarettes    Start date: 70    Quit date: 2007  Years since quitting: 17.8   Smokeless tobacco: Never  Vaping Use   Vaping status: Never Used  Substance Use Topics   Alcohol use: Not Currently   Drug use: Not Currently     Family Hx: The patient's family history includes Anxiety disorder in his father and mother; CAD in his brother; COPD in his mother; Cancer in his father; Colon cancer in his mother; Coronary artery disease in his father; Depression in his father and mother; Leukemia in his maternal grandmother; Multiple sclerosis in his daughter. There is no history of Diabetes.  ROS:   Please see the history of present illness.     All other systems reviewed and are negative.   Prior Sleep studies:   The following studies were reviewed today:  None  Labs/Other Tests and Data Reviewed:     Recent Labs: 07/13/2022: Magnesium 1.9 06/17/2023: ALT 13; BUN 6; Creatinine, Ser 0.72; Hemoglobin 14.4; Platelets 240; Potassium 3.9; Sodium 141   Wt Readings from Last 3 Encounters:  06/17/23 200 lb 0.6 oz (90.7 kg)  06/08/23 194 lb (88 kg)  05/21/23 203 lb 3.2 oz (92.2 kg)     Risk Assessment/Calculations:      Objective:    Vital Signs:  There were no vitals taken for this visit.  GEN: Well nourished, well developed in no acute distress HEENT: Normal NECK: No JVD; No carotid bruits LYMPHATICS: No lymphadenopathy CARDIAC:RRR, no murmurs, rubs, gallops RESPIRATORY:  Clear to auscultation without rales, wheezing  or rhonchi  ABDOMEN: Soft, non-tender, non-distended MUSCULOSKELETAL:  No edema; No deformity  SKIN: Warm and dry NEUROLOGIC:  Alert and oriented x 3 PSYCHIATRIC:  Normal affect  ASSESSMENT & PLAN:    OSA   -He  really struggled with his BiPAP device and could not use it at night. -referred to ENT -s/p Inspire device 07/30/2022 by Dr. Annalee Genta -Inspire device activation performed 09/04/22 with Inspire rep with following parameters:             -Initial activation waveform looked good.             -Stimulation levels              -Final settings include : -amplitude 1.2 V with the patient control of 1.2 to 2.2 V -Pulse width 90 s -Rate 33 Hz -Start delay 30 minutes -Pause time 15 minutes -Therapy duration 10 hours -Electrode configuration  A +/-/+ -Office visit 10/21/2022 he was using his ice 8 to 9 hours a night and was on level 9 which was 1.8 V.  He was complaining of feeling sleepy during the day and took an overnight pulse oximetry which showed O2 sats dropping into the 70s on his inspire device  -Office visit 11/13/2022 by Jari Favre,  PA and the inspire rep and device interrogation was performed with adjustments made.   -Office visit 02/07/2023 he was using his device on average 6-7 hours nightly.  Tongue motion looked good at 2.5V and was on 2.5V which is level 7.  He was told he could go down a level if he gets tongue sensitivity.  Also was having significant fatigue and was unclear as to whether this was related to starting back on his full-time job or residual OSA.  He uses 3 L of O2 with his inspire device at that time was no longer waking up gasping for breath or snoring -In-lab inspire titration 03/18/2023 was inadequate as patient could not be adequately titrated to resolve his apneas -Device interrogation  done on office visit 05/08/2023.  Different electrode configurations were tested both sitting and supine.  Best electrode configuration appeared to be configure C (-0-).   Different outputs were tested with maximum output 1.4 V.  The device was programmed to a range of 1.0 to 1.4 V.  He was started at level 2 (1.1 V) and instructed to stay at this level for at least a week and then slowly try to increase the level to at least level 4.  His sleep onset time was taken out from 30 minutes to 45 minutes. -Doing well OV today 06/19/2023  Hypertension -BP controlled on exam today -diet controlled  Medication Adjustments/Labs and Tests Ordered: Current medicines are reviewed at length with the patient today.  Concerns regarding medicines are outlined above.   Tests Ordered: No orders of the defined types were placed in this encounter.   Medication Changes: No orders of the defined types were placed in this encounter.   Follow Up: 6 week check-in to see how he is doing on adjustments made to his device with inspire rep at this time  Signed, Armanda Magic, MD  06/18/2023 9:00 PM    [facility]

## 2023-06-18 NOTE — Assessment & Plan Note (Signed)
He will continue to hold GLP-1 until follow-up with endocrinology.  Encouraged to stay well-hydrated.

## 2023-06-19 ENCOUNTER — Ambulatory Visit: Payer: 59 | Attending: Cardiology | Admitting: Cardiology

## 2023-06-19 DIAGNOSIS — I10 Essential (primary) hypertension: Secondary | ICD-10-CM

## 2023-06-19 DIAGNOSIS — G4733 Obstructive sleep apnea (adult) (pediatric): Secondary | ICD-10-CM

## 2023-06-30 ENCOUNTER — Other Ambulatory Visit (HOSPITAL_BASED_OUTPATIENT_CLINIC_OR_DEPARTMENT_OTHER): Payer: Self-pay

## 2023-07-10 ENCOUNTER — Other Ambulatory Visit (HOSPITAL_BASED_OUTPATIENT_CLINIC_OR_DEPARTMENT_OTHER): Payer: Self-pay

## 2023-07-10 DIAGNOSIS — G4734 Idiopathic sleep related nonobstructive alveolar hypoventilation: Secondary | ICD-10-CM | POA: Diagnosis not present

## 2023-07-11 ENCOUNTER — Other Ambulatory Visit (HOSPITAL_BASED_OUTPATIENT_CLINIC_OR_DEPARTMENT_OTHER): Payer: Self-pay

## 2023-07-21 ENCOUNTER — Other Ambulatory Visit (HOSPITAL_BASED_OUTPATIENT_CLINIC_OR_DEPARTMENT_OTHER): Payer: Self-pay

## 2023-07-21 ENCOUNTER — Other Ambulatory Visit: Payer: Self-pay | Admitting: Family Medicine

## 2023-07-21 MED ORDER — NYSTATIN 100000 UNIT/ML MT SUSP
5.0000 mL | Freq: Three times a day (TID) | OROMUCOSAL | 1 refills | Status: DC | PRN
  Filled 2023-07-21: qty 200, 14d supply, fill #0
  Filled 2023-08-08: qty 200, 14d supply, fill #1

## 2023-07-22 ENCOUNTER — Encounter: Payer: 59 | Admitting: Student in an Organized Health Care Education/Training Program

## 2023-07-28 ENCOUNTER — Ambulatory Visit: Payer: 59 | Admitting: Family Medicine

## 2023-07-29 ENCOUNTER — Encounter: Payer: Self-pay | Admitting: Family Medicine

## 2023-07-29 ENCOUNTER — Ambulatory Visit (INDEPENDENT_AMBULATORY_CARE_PROVIDER_SITE_OTHER): Payer: 59 | Admitting: Family Medicine

## 2023-07-29 VITALS — BP 152/78 | HR 84 | Ht 68.0 in | Wt 214.0 lb

## 2023-07-29 DIAGNOSIS — Z9641 Presence of insulin pump (external) (internal): Secondary | ICD-10-CM | POA: Diagnosis not present

## 2023-07-29 DIAGNOSIS — E119 Type 2 diabetes mellitus without complications: Secondary | ICD-10-CM | POA: Diagnosis not present

## 2023-07-29 DIAGNOSIS — L84 Corns and callosities: Secondary | ICD-10-CM | POA: Insufficient documentation

## 2023-07-29 NOTE — Assessment & Plan Note (Signed)
We discussed alternatives to GLP-1's for management of his weight.  His blood pressure is elevated and I do not think phentermine would be a good option for him at this time.Shona Simpson would likely not be a good option for him either due to his chronic opioid therapy.

## 2023-07-29 NOTE — Assessment & Plan Note (Signed)
Referral ordered for podiatry.

## 2023-07-29 NOTE — Progress Notes (Signed)
Brandon [name] - 67 y.o. male MRN [id number]  Date of birth: 1956/03/08  Subjective Chief Complaint  Patient presents with   Callouses    HPI Brandon [name] is a 67 year old male here today with complaint of painful calluses on bilateral feet.  This is worse on the right foot.  He does stand for longer periods of time at work which tends to exacerbate the pain.  He has had these shaved him in the past and would like a referral to podiatry.  He denies swelling or drainage.  He is interested in trying something different for weight loss as he has gained some weight back since being off of GLP-1's.  He is unable to utilize GLP-1's due to recurrent bowel obstruction.  Blood pressure is elevated today.  ROS:  A comprehensive ROS was completed and negative except as noted per HPI  Allergies  Allergen Reactions   Kiwi Extract Swelling    Mouth swelling.    Other Swelling and Other (See Comments)    EGGPLANT. Mouth swelling.   Penicillins Rash    Reaction: unknown   Ancef [Cefazolin] Rash   Latex Rash   Levaquin [Levofloxacin] Rash   Peach [Prunus Persica] Other (See Comments)    Burns mouth    Past Medical History:  Diagnosis Date   Anxiety    Arthritis    Basal cell carcinoma (BCC) in situ of skin    left neck   Chronic back pain    has spinal cord stimulator and wears buprenorphine patch   Colon polyps    Coronary artery disease    S/p DES to mRCA in 12/21 // S/p DES to LCx x 2 in 12/2020 // Diff dz in small to mod Dx (not a good target for PCI); OM1 100 CTO; severe dz in small OM2 and PDA >> Med Rx   Depression    Diabetes mellitus without complication (HCC)    type 2   Fibromyalgia    Gallstones    GERD (gastroesophageal reflux disease)    Hepatitis B    HLD (hyperlipidemia)    Hypertension    Osteoarthritis    Pneumonia    PONV (postoperative nausea and vomiting)    SBO (small bowel obstruction) (HCC)    Sleep apnea    does not use CPAP    Past Surgical  History:  Procedure Laterality Date   CARDIAC CATHETERIZATION  01/17/2021   CATARACT EXTRACTION W/ INTRAOCULAR LENS  IMPLANT, BILATERAL     CHOLECYSTECTOMY     CHOLECYSTECTOMY, LAPAROSCOPIC     COLONOSCOPY WITH PROPOFOL N/A 12/19/2022   Procedure: COLONOSCOPY WITH PROPOFOL;  Surgeon: Jenel Lucks, MD;  Location: [facility] ENDOSCOPY;  Service: Gastroenterology;  Laterality: N/A;   CORONARY STENT INTERVENTION N/A 08/15/2020   Procedure: CORONARY STENT INTERVENTION;  Surgeon: Corky Crafts, MD;  Location: [facility] INVASIVE CV LAB;  Service: Cardiovascular;  Laterality: N/A;   CORONARY STENT INTERVENTION N/A 01/17/2021   Procedure: CORONARY STENT INTERVENTION;  Surgeon: Kathleene Hazel, MD;  Location: [facility] INVASIVE CV LAB;  Service: Cardiovascular;  Laterality: N/A;   CORONARY ULTRASOUND/IVUS N/A 08/15/2020   Procedure: Intravascular Ultrasound/IVUS;  Surgeon: Corky Crafts, MD;  Location: [facility] INVASIVE CV LAB;  Service: Cardiovascular;  Laterality: N/A;   DRUG INDUCED ENDOSCOPY N/A 05/23/2022   Procedure: DRUG INDUCED SLEEP ENDOSCOPY;  Surgeon: Osborn Coho, MD;  Location: [facility] OR;  Service: ENT;  Laterality: N/A;   HERNIA REPAIR     IMPLANTATION OF HYPOGLOSSAL NERVE  STIMULATOR Right 07/30/2022   Procedure: IMPLANTATION OF HYPOGLOSSAL NERVE STIMULATOR;  Surgeon: Osborn Coho, MD;  Location: [facility];  Service: ENT;  Laterality: Right;   LEFT HEART CATH AND CORONARY ANGIOGRAPHY N/A 08/15/2020   Procedure: LEFT HEART CATH AND CORONARY ANGIOGRAPHY;  Surgeon: Corky Crafts, MD;  Location: [facility] INVASIVE CV LAB;  Service: Cardiovascular;  Laterality: N/A;   LEFT HEART CATH AND CORONARY ANGIOGRAPHY N/A 01/17/2021   Procedure: LEFT HEART CATH AND CORONARY ANGIOGRAPHY;  Surgeon: Kathleene Hazel, MD;  Location: [facility] INVASIVE CV LAB;  Service: Cardiovascular;  Laterality: N/A;   LUMBAR FUSION     L3-5 with rods   MICRODISCECTOMY LUMBAR  2006   POLYPECTOMY   12/19/2022   Procedure: POLYPECTOMY;  Surgeon: Jenel Lucks, MD;  Location: [facility] ENDOSCOPY;  Service: Gastroenterology;;   SPINAL CORD STIMULATOR INSERTION     blood clot removed from spinal cord   SPINE SURGERY     blood clot removed from spinal cord   XI ROBOTIC ASSISTED INGUINAL HERNIA REPAIR WITH MESH     x 3 surgeries    Social History   Socioeconomic History   Marital status: Married    Spouse name: Not on file   Number of children: 4   Years of education: 16   Highest education level: Not on file  Occupational History   Occupation: disabled/ respiratory therapist  Tobacco Use   Smoking status: Former    Current packs/day: 0.00    Average packs/day: 1 pack/day for 30.0 years (30.0 ttl pk-yrs)    Types: Cigarettes    Start date: 22    Quit date: 2007    Years since quitting: 17.9   Smokeless tobacco: Never  Vaping Use   Vaping status: Never Used  Substance and Sexual Activity   Alcohol use: Not Currently   Drug use: Not Currently   Sexual activity: Yes  Other Topics Concern   Not on file  Social History Narrative   ** Merged History Encounter **       Social Determinants of Health   Financial Resource Strain: Not on file  Food Insecurity: No Food Insecurity (06/08/2023)   Hunger Vital Sign    Worried About Running Out of Food in the Last Year: Never true    Ran Out of Food in the Last Year: Never true  Transportation Needs: No Transportation Needs (06/08/2023)   PRAPARE - Administrator, Civil Service (Medical): No    Lack of Transportation (Non-Medical): No  Physical Activity: Not on file  Stress: Not on file  Social Connections: Not on file    Family History  Problem Relation Age of Onset   COPD Mother    Anxiety disorder Mother    Depression Mother    Colon cancer Mother    Cancer Father        mets, origin unknown   Anxiety disorder Father    Depression Father    Coronary artery disease Father    CAD Brother    Leukemia  Maternal Grandmother    Multiple sclerosis Daughter    Diabetes Neg Hx     Health Maintenance  Topic Date Due   Medicare Annual Wellness (AWV)  Never done   Zoster Vaccines- Shingrix (1 of 2) Never done   COVID-19 Vaccine (3 - 2023-24 season) 04/27/2023   Hepatitis C Screening  03/26/2024 (Originally 09/13/1973)   HEMOGLOBIN A1C  09/27/2023   OPHTHALMOLOGY EXAM  12/04/2023   Diabetic  kidney evaluation - Urine ACR  03/26/2024   FOOT EXAM  05/20/2024   Diabetic kidney evaluation - eGFR measurement  06/16/2024   Colonoscopy  12/18/2032   DTaP/Tdap/Td (2 - Td or Tdap) 03/26/2033   Pneumonia Vaccine 57+ Years old  Completed   INFLUENZA VACCINE  Completed   HPV VACCINES  Aged Out     ----------------------------------------------------------------------------------------------------------------------------------------------------------------------------------------------------------------- Physical Exam BP (!) 152/78 (BP Location: Left Arm, Patient Position: Sitting, Cuff Size: Large)   Pulse 84   Ht 5\' 8"  (1.727 m)   Wt 214 lb (97.1 kg)   SpO2 98%   BMI 32.54 kg/m   Physical Exam Constitutional:      Appearance: Normal appearance.  HENT:     Head: Normocephalic and atraumatic.  Eyes:     General: No scleral icterus. Cardiovascular:     Rate and Rhythm: Normal rate and regular rhythm.  Pulmonary:     Effort: Pulmonary effort is normal.     Breath sounds: Normal breath sounds.  Musculoskeletal:     Cervical back: Neck supple.  Skin:    Comments: Painful callus/corn on plantar surface of right foot.  Neurological:     Mental Status: He is alert.  Psychiatric:        Mood and Affect: Mood normal.        Behavior: Behavior normal.     ------------------------------------------------------------------------------------------------------------------------------------------------------------------------------------------------------------------- Assessment and  Plan  Pre-ulcerative corn or callous Referral ordered for podiatry.  Morbid obesity (HCC) We discussed alternatives to GLP-1's for management of his weight.  His blood pressure is elevated and I do not think phentermine would be a good option for him at this time.Shona Simpson would likely not be a good option for him either due to his chronic opioid therapy.   No orders of the defined types were placed in this encounter.   Return in about 2 weeks (around 08/12/2023) for nurse visit for BP check.    This visit occurred during the SARS-CoV-2 public health emergency.  Safety protocols were in place, including screening questions prior to the visit, additional usage of staff PPE, and extensive cleaning of exam room while observing appropriate contact time as indicated for disinfecting solutions.

## 2023-07-31 ENCOUNTER — Encounter: Payer: 59 | Admitting: Student in an Organized Health Care Education/Training Program

## 2023-08-01 ENCOUNTER — Other Ambulatory Visit (HOSPITAL_BASED_OUTPATIENT_CLINIC_OR_DEPARTMENT_OTHER): Payer: Self-pay

## 2023-08-01 ENCOUNTER — Other Ambulatory Visit: Payer: Self-pay | Admitting: Internal Medicine

## 2023-08-06 ENCOUNTER — Ambulatory Visit: Payer: 59 | Admitting: Podiatry

## 2023-08-06 ENCOUNTER — Other Ambulatory Visit (HOSPITAL_BASED_OUTPATIENT_CLINIC_OR_DEPARTMENT_OTHER): Payer: Self-pay

## 2023-08-06 ENCOUNTER — Other Ambulatory Visit: Payer: Self-pay | Admitting: Internal Medicine

## 2023-08-06 ENCOUNTER — Encounter: Payer: Self-pay | Admitting: Podiatry

## 2023-08-06 DIAGNOSIS — E1142 Type 2 diabetes mellitus with diabetic polyneuropathy: Secondary | ICD-10-CM

## 2023-08-06 DIAGNOSIS — L84 Corns and callosities: Secondary | ICD-10-CM | POA: Diagnosis not present

## 2023-08-06 NOTE — Progress Notes (Signed)
  Subjective:  Patient ID: Brandon [name], male    DOB: 14-Nov-1955,   MRN: [id number]  No chief complaint on file.   67 y.o. male presents for concern of thickened painful calluses on his feet. Requesting to have them trimmed today. Also wondering about DM shoes.  Relates burning and tingling in their feet. Patient is diabetic and last A1c was  Lab Results  Component Value Date   HGBA1C 6.7 (H) 03/27/2023   .   PCP:  Everrett Coombe, DO    . Denies any other pedal complaints. Denies n/v/f/c.   Past Medical History:  Diagnosis Date   Anxiety    Arthritis    Basal cell carcinoma (BCC) in situ of skin    left neck   Chronic back pain    has spinal cord stimulator and wears buprenorphine patch   Colon polyps    Coronary artery disease    S/p DES to mRCA in 12/21 // S/p DES to LCx x 2 in 12/2020 // Diff dz in small to mod Dx (not a good target for PCI); OM1 100 CTO; severe dz in small OM2 and PDA >> Med Rx   Depression    Diabetes mellitus without complication (HCC)    type 2   Fibromyalgia    Gallstones    GERD (gastroesophageal reflux disease)    Hepatitis B    HLD (hyperlipidemia)    Hypertension    Osteoarthritis    Pneumonia    PONV (postoperative nausea and vomiting)    SBO (small bowel obstruction) (HCC)    Sleep apnea    does not use CPAP    Objective:  Physical Exam: Vascular: DP/PT pulses 2/4 bilateral. CFT <3 seconds. Absent hair growth on digits. Edema noted to bilateral lower extremities. Xerosis noted bilaterally.  Skin. No lacerations or abrasions bilateral feet. Nails 1-5 bilateral  are normal in appearance. Sub first metatarsal and fifth metatarsal bilateral with hyperkeratotic lesions. Nearly ulcerative under left first metatarsal Musculoskeletal: MMT 5/5 bilateral lower extremities in DF, PF, Inversion and Eversion. Deceased ROM in DF of ankle joint.  Neurological: Sensation intact to light touch. Protective sensation diminished bilateral.    Assessment:    1. Pre-ulcerative corn or callous   2. Type 2 diabetes mellitus with peripheral neuropathy (HCC)      Plan:  Patient was evaluated and treated and all questions answered. -Discussed and educated patient on diabetic foot care, especially with  regards to the vascular, neurological and musculoskeletal systems.  -Stressed the importance of good glycemic control and the detriment of not  controlling glucose levels in relation to the foot. -Discussed supportive shoes at all times and checking feet regularly.  -Mechanically debrided hyperkeratotic lesions with chisel without incident.  -DM shoes ordered.  -Answered all patient questions -Patient to return  in 3 months for at risk foot care -Patient advised to call the office if any problems or questions arise in the meantime.   Louann Sjogren, DPM

## 2023-08-07 ENCOUNTER — Other Ambulatory Visit (HOSPITAL_BASED_OUTPATIENT_CLINIC_OR_DEPARTMENT_OTHER): Payer: Self-pay

## 2023-08-07 MED ORDER — INSULIN LISPRO 100 UNIT/ML IJ SOLN
INTRAMUSCULAR | 3 refills | Status: AC
  Filled 2023-08-13: qty 50, 83d supply, fill #0
  Filled 2023-11-03: qty 50, 83d supply, fill #1
  Filled 2024-01-27: qty 20, 30d supply, fill #2
  Filled 2024-02-24: qty 50, 83d supply, fill #2

## 2023-08-08 ENCOUNTER — Emergency Department (HOSPITAL_BASED_OUTPATIENT_CLINIC_OR_DEPARTMENT_OTHER)
Admission: EM | Admit: 2023-08-08 | Discharge: 2023-08-08 | Disposition: A | Discharge status: Home / Self Care | Payer: 59 | Attending: Emergency Medicine | Admitting: Emergency Medicine

## 2023-08-08 ENCOUNTER — Ambulatory Visit (INDEPENDENT_AMBULATORY_CARE_PROVIDER_SITE_OTHER): Payer: 59 | Admitting: Family Medicine

## 2023-08-08 ENCOUNTER — Emergency Department (HOSPITAL_BASED_OUTPATIENT_CLINIC_OR_DEPARTMENT_OTHER): Payer: 59

## 2023-08-08 ENCOUNTER — Other Ambulatory Visit: Payer: Self-pay

## 2023-08-08 ENCOUNTER — Encounter: Payer: Self-pay | Admitting: Family Medicine

## 2023-08-08 ENCOUNTER — Encounter (HOSPITAL_BASED_OUTPATIENT_CLINIC_OR_DEPARTMENT_OTHER): Payer: Self-pay

## 2023-08-08 ENCOUNTER — Other Ambulatory Visit (HOSPITAL_BASED_OUTPATIENT_CLINIC_OR_DEPARTMENT_OTHER): Payer: Self-pay

## 2023-08-08 VITALS — BP 150/64 | HR 86 | Ht 68.0 in | Wt 215.2 lb

## 2023-08-08 DIAGNOSIS — I700 Atherosclerosis of aorta: Secondary | ICD-10-CM | POA: Diagnosis not present

## 2023-08-08 DIAGNOSIS — R599 Enlarged lymph nodes, unspecified: Secondary | ICD-10-CM | POA: Diagnosis not present

## 2023-08-08 DIAGNOSIS — M47819 Spondylosis without myelopathy or radiculopathy, site unspecified: Secondary | ICD-10-CM | POA: Diagnosis not present

## 2023-08-08 DIAGNOSIS — Z8619 Personal history of other infectious and parasitic diseases: Secondary | ICD-10-CM | POA: Diagnosis not present

## 2023-08-08 DIAGNOSIS — E119 Type 2 diabetes mellitus without complications: Secondary | ICD-10-CM | POA: Diagnosis not present

## 2023-08-08 DIAGNOSIS — B370 Candidal stomatitis: Secondary | ICD-10-CM | POA: Diagnosis not present

## 2023-08-08 DIAGNOSIS — J029 Acute pharyngitis, unspecified: Secondary | ICD-10-CM | POA: Insufficient documentation

## 2023-08-08 DIAGNOSIS — R070 Pain in throat: Secondary | ICD-10-CM | POA: Diagnosis not present

## 2023-08-08 DIAGNOSIS — R590 Localized enlarged lymph nodes: Secondary | ICD-10-CM | POA: Diagnosis not present

## 2023-08-08 DIAGNOSIS — Z91040 Latex allergy status: Secondary | ICD-10-CM | POA: Insufficient documentation

## 2023-08-08 LAB — CBC WITH DIFFERENTIAL/PLATELET
Abs Immature Granulocytes: 0.01 10*3/uL (ref 0.00–0.07)
Basophils Absolute: 0 10*3/uL (ref 0.0–0.1)
Basophils Relative: 0 %
Eosinophils Absolute: 0 10*3/uL (ref 0.0–0.5)
Eosinophils Relative: 1 %
HCT: 42.5 % (ref 39.0–52.0)
Hemoglobin: 14.7 g/dL (ref 13.0–17.0)
Immature Granulocytes: 0 %
Lymphocytes Relative: 25 %
Lymphs Abs: 1.1 10*3/uL (ref 0.7–4.0)
MCH: 32.5 pg (ref 26.0–34.0)
MCHC: 34.6 g/dL (ref 30.0–36.0)
MCV: 94 fL (ref 80.0–100.0)
Monocytes Absolute: 0.5 10*3/uL (ref 0.1–1.0)
Monocytes Relative: 10 %
Neutro Abs: 2.9 10*3/uL (ref 1.7–7.7)
Neutrophils Relative %: 64 %
Platelets: 191 10*3/uL (ref 150–400)
RBC: 4.52 MIL/uL (ref 4.22–5.81)
RDW: 12.2 % (ref 11.5–15.5)
WBC: 4.6 10*3/uL (ref 4.0–10.5)
nRBC: 0 % (ref 0.0–0.2)

## 2023-08-08 LAB — COMPREHENSIVE METABOLIC PANEL
ALT: 21 U/L (ref 0–44)
AST: 28 U/L (ref 15–41)
Albumin: 3.9 g/dL (ref 3.5–5.0)
Alkaline Phosphatase: 68 U/L (ref 38–126)
Anion gap: 9 (ref 5–15)
BUN: 9 mg/dL (ref 8–23)
CO2: 24 mmol/L (ref 22–32)
Calcium: 8.8 mg/dL — ABNORMAL LOW (ref 8.9–10.3)
Chloride: 102 mmol/L (ref 98–111)
Creatinine, Ser: 0.78 mg/dL (ref 0.61–1.24)
GFR, Estimated: >60 mL/min (ref >60)
Glucose, Bld: 262 mg/dL — ABNORMAL HIGH (ref 70–99)
Potassium: 3.7 mmol/L (ref 3.5–5.1)
Sodium: 135 mmol/L (ref 135–145)
Total Bilirubin: 0.9 mg/dL (ref <1.2)
Total Protein: 7.1 g/dL (ref 6.5–8.1)

## 2023-08-08 LAB — GROUP A STREP BY PCR: Group A Strep by PCR: NOT DETECTED

## 2023-08-08 MED ORDER — IOHEXOL 300 MG/ML  SOLN
75.0000 mL | Freq: Once | INTRAMUSCULAR | Status: AC | PRN
  Administered 2023-08-08: 75 mL via INTRAVENOUS

## 2023-08-08 NOTE — ED Triage Notes (Signed)
The patient is having a sore throat and neck pain. He was seen at PCP and they went him to have a CT of his neck. He has been getting treated for thrush this week. He is concerned his throat is swelling. No NAD at this time.

## 2023-08-08 NOTE — Progress Notes (Signed)
Acute Office Visit  Subjective:     Patient ID: Brandon [name], male    DOB: [birth year], 67 y.o.   MRN: [id number]  No chief complaint on file.   HPI Patient is in today for acute visit with concerns of "feeling like airway is closing." He says he feels like it is affecting his speech.   Pmh significant for OSA with inspire device placed by ENT one year ago. He says for the past two nights he has not turned his device on because it was bothering his tongue. He said he was diagnosed with thrush one week ago and given a prescription of magic mouthwash to use. He notes feeling like his throat is swelling and says his tonsils feel swollen as well. Also admits to some neck stiffness and soreness when trying to turn. Does admit to lymph nodes feeling hard and swollen. He did go to work this am and says that has noticed an increase in slurred speech and says it hurts to talk.   Review of Systems  Constitutional:  Negative for chills and fever.  HENT:  Positive for sore throat.        Feels throat closing  Respiratory:  Negative for cough and shortness of breath.   Cardiovascular:  Negative for chest pain.  Neurological:  Positive for headaches.        Objective:    BP (!) 150/64 (BP Location: Left Arm, Patient Position: Sitting, Cuff Size: Large)   Pulse 86   Ht 5\' 8"  (1.727 m)   Wt 215 lb 4 oz (97.6 kg)   SpO2 99%   BMI 32.73 kg/m    Physical Exam Vitals and nursing note reviewed.  Constitutional:      General: He is not in acute distress.    Appearance: Normal appearance.  HENT:     Head: Normocephalic and atraumatic.     Comments: Tender cervical lymphadenopathy. Lymph nodes are hardened bilaterally Airway in tact and no swelling of tonsils appreciated  Tongue normal-no thrush like symptoms noted    Right Ear: External ear normal.     Left Ear: External ear normal.     Nose: Nose normal.  Eyes:     Conjunctiva/sclera: Conjunctivae normal.  Cardiovascular:     Rate  and Rhythm: Normal rate and regular rhythm.  Pulmonary:     Effort: Pulmonary effort is normal.     Breath sounds: Normal breath sounds.  Neurological:     General: No focal deficit present.     Mental Status: He is alert and oriented to person, place, and time.     Comments: CN II-XII in tact 5/5 muscle strength upper and lower extremities bilaterally  No facial asymmetry or sensory deficits   Psychiatric:        Mood and Affect: Mood normal.        Behavior: Behavior normal.        Thought Content: Thought content normal.        Judgment: Judgment normal.     No results found for any visits on 08/08/23.      Assessment & Plan:   Problem List Items Addressed This Visit       Other   Sore throat - Primary   - pt notes some swelling of tonsils which I was unable to appreciate on exam. Did appreciate tender cervical lymphadenopathy with some hardening of the lymph nodes. Airway intact. No signs of stroke. However differential concerning with lymph adenopathy and  patient feeling like throat is closing makes me wonder about a mass. Discussed and advised pt to go to the ED as we will likely need stat CT head/neck to further evaluate lymphadenopathy in the absence of sick symptoms. Pt agreeable to plan. Also recommended he get back in touch with ENT. He said his ENT retired but I encouraged him to find another provider in that office that can properly monitor his inspire device as I am unclear if this is related and he has a device malfunction        No orders of the defined types were placed in this encounter.   Should follow up with PCP 1-2 weeks after ED visit   Charlton Amor, DO

## 2023-08-08 NOTE — ED Provider Notes (Signed)
3:30 PM Patient signed out to me by previous ED physician. Pt is a 67 yo male presenting for throat tightness with a hx of hypoglossal nerve stimulator for OSA.   Labs stable. No s/s sepsis. CT soft tissue neck pending. Plan: f/u call with ENT.     Physical Exam  BP (!) 142/80   Pulse 88   Temp 98.3 F (36.8 C) (Oral)   Resp 16   Ht 5\' 8"  (1.727 m)   Wt 97 kg   SpO2 96%   BMI 32.52 kg/m   Physical Exam  Procedures  Procedures  ED Course / MDM    Medical Decision Making Amount and/or Complexity of Data Reviewed Labs: ordered. Radiology: ordered.  Risk Prescription drug management.   Patient requesting to leave prior to imaging studies complete stating "I have to leave now, I cannot drive at night". His phonation is normal, is tolerating his secretions without difficulty, and has no signs of hypoxia or respiratory distress at this time. Patient recommended for prompt return if symptoms worsen in anyway. He will be returning home to the care of his wife who will be watching over him.                Franne Forts, DO 08/11/23 1435

## 2023-08-08 NOTE — Discharge Instructions (Signed)
I will call you with your CT results once read formally by the radiologist.   Return to ED for signs of airway distress.  Continue thrush meds.

## 2023-08-08 NOTE — Assessment & Plan Note (Signed)
-   pt notes some swelling of tonsils which I was unable to appreciate on exam. Did appreciate tender cervical lymphadenopathy with some hardening of the lymph nodes. Airway intact. No signs of stroke. However differential concerning with lymph adenopathy and patient feeling like throat is closing makes me wonder about a mass. Discussed and advised pt to go to the ED as we will likely need stat CT head/neck to further evaluate lymphadenopathy in the absence of sick symptoms. Pt agreeable to plan. Also recommended he get back in touch with ENT. He said his ENT retired but I encouraged him to find another provider in that office that can properly monitor his inspire device as I am unclear if this is related and he has a device malfunction

## 2023-08-08 NOTE — ED Notes (Signed)
Discharge paperwork reviewed entirely with patient, including follow up care. Pain was under control. No prescriptions were called in, but all questions were addressed.  Pt verbalized understanding as well as all parties involved. No questions or concerns voiced at the time of discharge. No acute distress noted.   Pt ambulated out to PVA without incident or assistance.  Pt advised they will notify their PCP immediately.  Patient will be called by provider when CT results are available by radiology.

## 2023-08-08 NOTE — ED Provider Notes (Signed)
[facility] Provider Note   CSN: [id number] Arrival date & time: 08/08/23  1139     History  Chief Complaint  Patient presents with   Sore Throat    Brandon [name] is a 67 y.o. male.  He has a history of diabetes.  He said he is being treated for thrush of the mouth for the last week.  Complaining of feeling like his throat and tongue are swollen and difficulty speaking and swallowing for the last day.  No fever or cough.  No clear allergic trigger.  He does have obstructive sleep apnea and has a inspire device but it has been in place for over a year.  Has not had any real problems with it.  Saw his PCP who sent him here to get some imaging.  The history is provided by the patient.  Sore Throat This is a new problem. The current episode started yesterday. The problem occurs constantly. The problem has not changed since onset.Pertinent negatives include no chest pain, no abdominal pain, no headaches and no shortness of breath. The symptoms are aggravated by swallowing. Nothing relieves the symptoms. He has tried rest for the symptoms. The treatment provided no relief.       Home Medications Prior to Admission medications   Medication Sig Start Date End Date Taking? Authorizing Provider  acetaminophen (TYLENOL) 500 MG tablet Take 1,000 mg by mouth See admin instructions. Take 2 tablets (1000 mg) by mouth scheduled in the morning & may take 2 tablets (1000 mg) by mouth every 6 hours if needed for pain.    [provider]  albuterol (VENTOLIN HFA) 108 (90 Base) MCG/ACT inhaler Inhale 2 puffs into the lungs as needed for wheezing or shortness of breath (When sick). 10/29/21   [provider]  AMBULATORY NON FORMULARY MEDICATION Please provide home oxygen for nocturnal use.  Apply 2-4L of O2 via nasal cannula at bedtime.  Dx: Nocturnal Oxygen Desaturation G47.34 11/06/22   Everrett Coombe, DO  AMBULATORY NON FORMULARY MEDICATION  Please provide humidifier for home oxygen due to nasal irritation with use. 11/13/22   Everrett Coombe, DO  ARIPiprazole (ABILIFY) 10 MG tablet Take 1 tablet (10 mg total) by mouth every morning. 05/23/23   Everrett Coombe, DO  atorvastatin (LIPITOR) 80 MG tablet Take 1 tablet (80 mg total) by mouth daily. 08/21/22   Meriam Sprague, MD  b complex vitamins capsule Take 1 capsule by mouth in the morning.    [provider]  buPROPion (WELLBUTRIN XL) 150 MG 24 hr tablet Take 1 tablet (150 mg total) by mouth daily in addition to 300mg  tablet. 03/25/23   Everrett Coombe, DO  buPROPion (WELLBUTRIN XL) 300 MG 24 hr tablet Take 1 tablet (300 mg total) by mouth daily. 05/21/23   Everrett Coombe, DO  clopidogrel (PLAVIX) 75 MG tablet Take 1 tablet (75 mg total) by mouth daily with breakfast. 12/20/22   Jenel Lucks, MD  Continuous Glucose Sensor (DEXCOM G6 SENSOR) MISC Use as directed. Change sensor every 10 days 03/11/23   Everrett Coombe, DO  Continuous Glucose Sensor (DEXCOM G6 SENSOR) MISC Use as directed. Change sensor every 10 days 05/29/23   Everrett Coombe, DO  docusate sodium (COLACE) 100 MG capsule Take 2 capsules (200 mg total) by mouth in the morning. 10/24/22   Everrett Coombe, DO  empagliflozin (JARDIANCE) 25 MG TABS tablet Take 1 tablet (25 mg total) by mouth daily. 10/22/22   Ashley Royalty,  [place], DO  GVOKE HYPOPEN 1-PACK 1 MG/0.2ML SOAJ Use as needed for hypoglycemia 05/13/22   Carlus Pavlov, MD  insulin lispro (HUMALOG) 100 UNIT/ML injection For use in pump, total of 60 units per day 08/07/23   Carlus Pavlov, MD  Iron, Ferrous Sulfate, 325 (65 Fe) MG TABS Take 1 tablet (325 mg) by mouth daily. 05/21/23   Everrett Coombe, DO  magic mouthwash (nystatin, lidocaine, diphenhydrAMINE, alum & mag hydroxide) suspension Swish and spit 5 mLs 3 (three) times daily as needed for mouth pain. 07/21/23   Everrett Coombe, DO  magnesium oxide (MAG-OX) 400 (240 Mg) MG tablet Take 800 mg by mouth in the  morning.    [provider]  metoCLOPramide (REGLAN) 10 MG tablet Take 1 tablet (10 mg total) by mouth daily as needed for nausea or vomiting (upset stomach). 10/24/22   Everrett Coombe, DO  Multiple Vitamin (MULTI-VITAMIN) tablet Take 1 tablet by mouth daily. 02/14/23   Everrett Coombe, DO  nitroGLYCERIN (NITROSTAT) 0.4 MG SL tablet Place 0.4 mg under the tongue every 5 (five) minutes x 3 doses as needed for chest pain.    [provider]  ondansetron (ZOFRAN-ODT) 4 MG disintegrating tablet Take 1 tablet (4 mg total) by mouth every 8 (eight) hours as needed for nausea or vomiting. 02/11/23   Everrett Coombe, DO  pantoprazole (PROTONIX) 40 MG tablet Take 1 tablet (40 mg total) by mouth daily. 05/21/23   Everrett Coombe, DO  polyethylene glycol powder (MIRALAX) 17 GM/SCOOP powder Mix 17 grams (1 capful) in 8 oz of liquid and drink once a day 06/11/23   Burnadette Pop, MD  tamsulosin (FLOMAX) 0.4 MG CAPS capsule Take 1 capsule (0.4 mg total) by mouth daily. 02/11/23   Everrett Coombe, DO  testosterone (ANDROGEL) 50 MG/5GM (1%) GEL Place 5 g (1 packet) onto the shoulders and upper arms daily as directed. 05/23/23 06/25/23  Everrett Coombe, DO  tiZANidine (ZANAFLEX) 4 MG tablet Take 1-1&1/2 tablets (4-6 mg total) by mouth every 8 (eight) hours as needed for muscle spasms. 09/04/22   Quintella Reichert, MD      Allergies    Kiwi extract, Other, Penicillins, Ancef [cefazolin], Latex, Levaquin [levofloxacin], and Peach [prunus persica]    Review of Systems   Review of Systems  Constitutional:  Negative for fever.  HENT:  Positive for mouth sores, sore throat and trouble swallowing.   Respiratory:  Negative for shortness of breath.   Cardiovascular:  Negative for chest pain.  Gastrointestinal:  Negative for abdominal pain.  Neurological:  Negative for headaches.    Physical Exam Updated Vital Signs BP (!) 142/80   Pulse 88   Temp 98.3 F (36.8 C) (Oral)   Resp 16   Ht 5\' 8"  (1.727 m)   Wt  97 kg   SpO2 96%   BMI 32.52 kg/m  Physical Exam Vitals and nursing note reviewed.  Constitutional:      General: He is not in acute distress.    Appearance: He is well-developed.  HENT:     Head: Normocephalic and atraumatic.     Right Ear: Tympanic membrane and ear canal normal.     Left Ear: Tympanic membrane and ear canal normal.     Mouth/Throat:     Mouth: Mucous membranes are moist.     Pharynx: Oropharynx is clear. Posterior oropharyngeal erythema present. No oropharyngeal exudate.     Tonsils: No tonsillar abscesses.  Eyes:     Conjunctiva/sclera: Conjunctivae normal.  Neck:  Comments: He has a fairly large tender right anterior cervical lymph node.  No fluctuance Cardiovascular:     Rate and Rhythm: Normal rate and regular rhythm.     Heart sounds: No murmur heard. Pulmonary:     Effort: Pulmonary effort is normal. No respiratory distress.     Breath sounds: Normal breath sounds.  Abdominal:     Palpations: Abdomen is soft.     Tenderness: There is no abdominal tenderness.  Musculoskeletal:        General: No swelling.     Cervical back: Neck supple.  Lymphadenopathy:     Cervical: Cervical adenopathy present.  Skin:    General: Skin is warm and dry.     Capillary Refill: Capillary refill takes less than 2 seconds.  Neurological:     General: No focal deficit present.     Mental Status: He is alert.     ED Results / Procedures / Treatments   Labs (all labs ordered are listed, but only abnormal results are displayed) Labs Reviewed  COMPREHENSIVE METABOLIC PANEL - Abnormal; Notable for the following components:      Result Value   Glucose, Bld 262 (*)    Calcium 8.8 (*)    All other components within normal limits  GROUP A STREP BY PCR  CBC WITH DIFFERENTIAL/PLATELET    EKG None  Radiology CT Soft Tissue Neck W Contrast Result Date: 08/08/2023 CLINICAL DATA:  Epiglottitis or tonsillitis. The patient is currently being treated for thrush.  Swelling. EXAM: CT NECK WITH CONTRAST TECHNIQUE: Multidetector CT imaging of the neck was performed using the standard protocol following the bolus administration of intravenous contrast. RADIATION DOSE REDUCTION: This exam was performed according to the departmental dose-optimization program which includes automated exposure control, adjustment of the mA and/or kV according to patient size and/or use of iterative reconstruction technique. CONTRAST:  75mL OMNIPAQUE IOHEXOL 300 MG/ML  SOLN COMPARISON:  One-view neck radiographs 07/30/2022 FINDINGS: Pharynx and larynx: No focal mucosal or submucosal lesions are present. Nasopharynx is clear. The soft palate and tongue base are within normal limits. The oropharynx is unremarkable. Vallecula and epiglottis are within normal limits. Aryepiglottic folds and piriform sinuses are clear. Vocal cords are midline and symmetric. Trachea is clear. Salivary glands: The submandibular and parotid glands and ducts are within normal limits. Thyroid: Normal Lymph nodes: Significant adenopathy is present. Vascular: Mild atherosclerotic calcifications are present at the aorta great vessel origins bilateral carotid bifurcations without significant stenoses. Limited intracranial: Within normal limits. Visualized orbits: Bilateral lens replacements are noted. Globes and orbits are otherwise unremarkable. Mastoids and visualized paranasal sinuses: The paranasal sinuses and mastoid air cells are clear. Skeleton: Multilevel degenerative changes are present. No focal osseous lesions are present. Patient is edentulous. Upper chest: Patchy ground-glass attenuation is present in the upper lobes bilaterally. No pneumothorax is present. Significant pleural effusion is present. No nodule or focal airspace consolidation present. IMPRESSION: 1. Normal CT appearance of the neck. No acute or focal lesion to explain the patient's symptoms. 2. Patchy ground-glass attenuation in the upper lobes bilaterally  is nonspecific, but may be related to edema or infection. 3.  Aortic Atherosclerosis (ICD10-I70.0). Electronically Signed   By: Marin Roberts M.D.   On: 08/08/2023 16:44    Procedures Procedures    Medications Ordered in ED Medications - No data to display  ED Course/ Medical Decision Making/ A&P  Medical Decision Making Amount and/or Complexity of Data Reviewed Labs: ordered. Radiology: ordered.  Risk Prescription drug management.   This patient complains of tightness discomfort in his throat and tongue, sensation of tightening; this involves an extensive number of treatment Options and is a complaint that carries with it a high risk of complications and morbidity. The differential includes pharyngitis, airway compromise, mass, infection, epiglottitis  I ordered, reviewed and interpreted labs, which included CBC normal chemistries normal other than elevated glucose strep negative I ordered imaging studies which included CT neck with contrast and I independently    visualized and interpreted imaging which showed no acute findings Previous records obtained and reviewed in epic including recent PCP notes  Social determinants considered, no significant barriers Critical Interventions: None  After the interventions stated above, I reevaluated the patient and found patient to be hemodynamically stable in no distress Admission and further testing considered, patient's care is signed out to Dr. Wallace Cullens to follow-up on final reading of CT.  May need input from ENT if significant abnormalities.         Final Clinical Impression(s) / ED Diagnoses Final diagnoses:  Throat pain  History of oral candidiasis    Rx / DC Orders ED Discharge Orders     None         Terrilee Files, MD 08/08/23 1743

## 2023-08-09 DIAGNOSIS — G4734 Idiopathic sleep related nonobstructive alveolar hypoventilation: Secondary | ICD-10-CM | POA: Diagnosis not present

## 2023-08-12 ENCOUNTER — Ambulatory Visit: Payer: 59

## 2023-08-13 ENCOUNTER — Other Ambulatory Visit: Payer: Self-pay

## 2023-08-13 ENCOUNTER — Other Ambulatory Visit (HOSPITAL_BASED_OUTPATIENT_CLINIC_OR_DEPARTMENT_OTHER): Payer: Self-pay

## 2023-08-13 ENCOUNTER — Other Ambulatory Visit: Payer: Self-pay | Admitting: Family Medicine

## 2023-08-14 ENCOUNTER — Other Ambulatory Visit (HOSPITAL_BASED_OUTPATIENT_CLINIC_OR_DEPARTMENT_OTHER): Payer: Self-pay

## 2023-08-14 ENCOUNTER — Encounter: Payer: Self-pay | Admitting: Student in an Organized Health Care Education/Training Program

## 2023-08-14 ENCOUNTER — Ambulatory Visit
Payer: 59 | Attending: Student in an Organized Health Care Education/Training Program | Admitting: Student in an Organized Health Care Education/Training Program

## 2023-08-14 VITALS — BP 141/67 | HR 84 | Temp 97.7°F | Resp 16 | Ht 68.0 in | Wt 210.0 lb

## 2023-08-14 DIAGNOSIS — M961 Postlaminectomy syndrome, not elsewhere classified: Secondary | ICD-10-CM | POA: Diagnosis not present

## 2023-08-14 DIAGNOSIS — G894 Chronic pain syndrome: Secondary | ICD-10-CM | POA: Insufficient documentation

## 2023-08-14 DIAGNOSIS — M4802 Spinal stenosis, cervical region: Secondary | ICD-10-CM | POA: Insufficient documentation

## 2023-08-14 DIAGNOSIS — M5412 Radiculopathy, cervical region: Secondary | ICD-10-CM | POA: Diagnosis not present

## 2023-08-14 DIAGNOSIS — Z9689 Presence of other specified functional implants: Secondary | ICD-10-CM | POA: Diagnosis not present

## 2023-08-14 MED ORDER — BUPRENORPHINE 10 MCG/HR TD PTWK
1.0000 | MEDICATED_PATCH | TRANSDERMAL | 3 refills | Status: AC
  Filled 2023-08-14: qty 4, 28d supply, fill #0
  Filled 2023-11-11: qty 4, 28d supply, fill #1

## 2023-08-14 MED ORDER — ONDANSETRON 4 MG PO TBDP
4.0000 mg | ORAL_TABLET | Freq: Three times a day (TID) | ORAL | 0 refills | Status: AC | PRN
  Filled 2023-08-14: qty 20, 7d supply, fill #0

## 2023-08-14 NOTE — Progress Notes (Signed)
Safety precautions to be maintained throughout the outpatient stay will include: orient to surroundings, keep bed in low position, maintain call bell within reach at all times, provide assistance with transfer out of bed and ambulation.  

## 2023-08-14 NOTE — Progress Notes (Signed)
PROVIDER NOTE: Information contained herein reflects review and annotations entered in association with encounter. Interpretation of such information and data should be left to medically-trained personnel. Information provided to patient can be located elsewhere in the medical record under "Patient Instructions". Document created using STT-dictation technology, any transcriptional errors that may result from process are unintentional.    Patient: Brandon [name]  Service Category: E/M  Provider: Edward Jolly, MD  DOB: 1955-10-08  DOS: 08/14/2023  Referring Provider: Everrett Coombe, DO  MRN: [id number]  Specialty: Interventional Pain Management  PCP: Everrett Coombe, DO  Type: Established Patient  Setting: Ambulatory outpatient    Location: Office  Delivery: Face-to-face     HPI  [name], a 67 y.o. year old male, is here today because of his Chronic pain syndrome [G89.4]. [name] primary complain today is Back Pain Last encounter: My last encounter with him was on 04/10/23 Pertinent problems: [name] has Diabetes mellitus type 2, uncomplicated, on long term insulin pump (HCC); Lumbar facet arthropathy; Coronary artery disease; Acute pain of left shoulder; Sacroiliac joint disease; Chronic pain syndrome; History of lumbar fusion (L4/5); and Pain management contract signed on their pertinent problem list. Pain Assessment: Severity of Chronic pain is reported as a 2 /10. Location: Back Lower/denies. Onset: More than a month ago. Quality: Aching. Timing: Constant. Modifying factor(s): butrans. Vitals:  height is 5\' 8"  (1.727 m) and weight is 210 lb (95.3 kg). His temperature is 97.7 F (36.5 C). His blood pressure is 141/67 (abnormal) and his pulse is 84. His respiration is 16 and oxygen saturation is 98%.   Reason for encounter: medication management.   Discussed the use of AI scribe software for clinical note transcription with the patient, who gave verbal consent to proceed.  History  of Present Illness   The patient, with an implanted Inspire device, presented with a recent episode of throat closure at work, associated with swollen lymph nodes and tongue swelling. The symptoms worsened with prolonged speech. This episode led to an ER visit where a soft tissue CT was performed, but the patient did not receive the results. 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  The patient also reported drowsiness as a side effect of his 15mg  patch medication, which was noted by his spouse. The patient has a history of poor sleep and chronic fatigue. The patient expressed a desire to decrease the dosage to 10mg  to alleviate the drowsiness while maintaining pain control.  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 Despite these challenges, the patient reported good balance and no recent falls. However, he noted a decreased range of motion, requiring assistance or tools to pick up items from the floor.       Continues with hypoglossal stimulator with ENT for obstructive sleep apnea management. He states it is helping and he is sleeping 7-8 hrs at night with uninterrupted sleep.    Pharmacotherapy Assessment  Analgesic: Butrans 68mcg/hr   Monitoring: [place] PMP: PDMP reviewed during this encounter.       Pharmacotherapy: No side-effects or adverse reactions reported. Compliance: No problems identified. Effectiveness: Clinically acceptable.  Brandon Mattes, RN  08/14/2023 10:38 AM  Sign when Signing Visit Safety precautions to be maintained throughout the outpatient stay will include: orient to surroundings, keep bed in low position, maintain call bell within reach at all times, provide assistance with transfer  out of bed and ambulation.   No results found for: "CBDTHCR" No results found for: "D8THCCBX" No results found for: "D9THCCBX"  UDS:  Summary  Date Value Ref  Range Status  10/02/2021 Note  Final    Comment:    ==================================================================== Compliance Drug Analysis, Ur ==================================================================== Test                             Result       Flag       Units  Drug Present and Declared for Prescription Verification   Buprenorphine                  8            EXPECTED   ng/mg creat   Norbuprenorphine               6            EXPECTED   ng/mg creat    Source of buprenorphine is a scheduled prescription medication.    Norbuprenorphine is an expected metabolite of buprenorphine.    Tizanidine                     PRESENT      EXPECTED   Bupropion                      PRESENT      EXPECTED   Hydroxybupropion               PRESENT      EXPECTED    Hydroxybupropion is an expected metabolite of bupropion.    Aripiprazole                   PRESENT      EXPECTED   Metoprolol                     PRESENT      EXPECTED  Drug Present not Declared for Prescription Verification   Acetaminophen                  PRESENT      UNEXPECTED  Drug Absent but Declared for Prescription Verification   Alpha-hydroxytriazolam         Not Detected UNEXPECTED ng/mg creat   Tramadol                       Not Detected UNEXPECTED ng/mg creat   Trazodone                      Not Detected UNEXPECTED   Salicylate                     Not Detected UNEXPECTED    Aspirin, as indicated in the declared medication list, is not always    detected even when used as directed.    Diphenhydramine                Not Detected UNEXPECTED   Lidocaine                      Not Detected UNEXPECTED    Lidocaine, as indicated in the declared medication list, is not    always detected even when used as directed.  ==================================================================== Test  Result    Flag   Units      Ref Range   Creatinine              99               mg/dL       >=16 ==================================================================== Declared Medications:  The flagging and interpretation on this report are based on the  following declared medications.  Unexpected results may arise from  inaccuracies in the declared medications.   **Note: The testing scope of this panel includes these medications:   Aripiprazole (Abilify)  Bupropion (Wellbutrin)  Diphenhydramine  Metoprolol (Toprol)  Tramadol (Ultram)  Trazodone (Desyrel)  Triazolam (Halcion)   **Note: The testing scope of this panel does not include small to  moderate amounts of these reported medications:   Aspirin  Buprenorphine Patch (BuTrans)  Lidocaine  Tizanidine (Zanaflex)  Topical Lidocaine (Lidoderm)   **Note: The testing scope of this panel does not include the  following reported medications:   Amlodipine (Norvasc)  Atorvastatin (Lipitor)  Clopidogrel (Plavix)  Empagliflozin (Jardiance)  Insulin (Humalog)  Isosorbide (Imdur)  Losartan (Cozaar)  Metoclopramide (Reglan)  Nitroglycerin (Nitrostat)  Nystatin (Mycostatin)  Ondansetron (Zofran)  Pantoprazole (Protonix)  Prednisolone  Semaglutide (Ozempic)  Tamsulosin (Flomax)  Testosterone ==================================================================== For clinical consultation, please call [phone]. ====================================================================       ROS  Constitutional: Denies any fever or chills Gastrointestinal: No reported hemesis, hematochezia, vomiting, or acute GI distress Musculoskeletal:  +LBP Neurological: No reported episodes of acute onset apraxia, aphasia, dysarthria, agnosia, amnesia, paralysis, loss of coordination, or loss of consciousness  Medication Review  AMBULATORY NON FORMULARY MEDICATION, ARIPiprazole, Dexcom G6 Sensor, Glucagon, Multi-Vitamin, acetaminophen, albuterol, atorvastatin, b complex vitamins, buPROPion, buprenorphine, clopidogrel, docusate  sodium, empagliflozin, ferrous sulfate, insulin lispro, (magic mouthwash (nystatin, lidocaine, diphenhydrAMINE, alum & mag hydroxide) suspension), magnesium oxide, metoCLOPramide, nitroGLYCERIN, ondansetron, pantoprazole, polyethylene glycol powder, tamsulosin, testosterone, and tiZANidine  History Review  Allergy: [name] is allergic to kiwi extract, other, penicillins, ancef [cefazolin], latex, levaquin [levofloxacin], and peach [prunus persica]. Drug: [name]  reports that he does not currently use drugs. Alcohol:  reports that he does not currently use alcohol. Tobacco:  reports that he quit smoking about 17 years ago. His smoking use included cigarettes. He started smoking about 47 years ago. He has a 30 pack-year smoking history. He has never used smokeless tobacco. Social: [name]  reports that he quit smoking about 17 years ago. His smoking use included cigarettes. He started smoking about 47 years ago. He has a 30 pack-year smoking history. He has never used smokeless tobacco. He reports that he does not currently use alcohol. He reports that he does not currently use drugs. Medical:  has a past medical history of Anxiety, Arthritis, Basal cell carcinoma (BCC) in situ of skin, Chronic back pain, Colon polyps, Coronary artery disease, Depression, Diabetes mellitus without complication (HCC), Fibromyalgia, Gallstones, GERD (gastroesophageal reflux disease), Hepatitis B, HLD (hyperlipidemia), Hypertension, Osteoarthritis, Pneumonia, PONV (postoperative nausea and vomiting), SBO (small bowel obstruction) (HCC), and Sleep apnea. Surgical: [name]  has a past surgical history that includes Spinal cord stimulator insertion; Cholecystectomy, laparoscopic; Cataract extraction w/ intraocular lens  implant, bilateral; Lumbar fusion; XI Robotic assisted inguinal hernia repair with mesh; LEFT HEART CATH AND CORONARY ANGIOGRAPHY (N/A, 08/15/2020); Coronary Ultrasound/IVUS (N/A, 08/15/2020); CORONARY  STENT INTERVENTION (N/A, 08/15/2020); Cardiac catheterization (01/17/2021); LEFT HEART CATH AND CORONARY ANGIOGRAPHY (N/A, 01/17/2021); CORONARY STENT INTERVENTION (N/A, 01/17/2021); Microdiscectomy lumbar (2006); Spine surgery; Hernia repair;  Drug induced endoscopy (N/A, 05/23/2022); Cholecystectomy; Implantation of hypoglossal nerve stimulator (Right, 07/30/2022); Colonoscopy with propofol (N/A, 12/19/2022); and polypectomy (12/19/2022). Family: family history includes Anxiety disorder in his father and mother; CAD in his brother; COPD in his mother; Cancer in his father; Colon cancer in his mother; Coronary artery disease in his father; Depression in his father and mother; Leukemia in his maternal grandmother; Multiple sclerosis in his daughter.  Laboratory Chemistry Profile   Renal Lab Results  Component Value Date   BUN 9 08/08/2023   CREATININE 0.78 08/08/2023   BCR 8 (L) 06/17/2023   GFRAA >60 04/17/2020   GFRNONAA >60 08/08/2023    Hepatic Lab Results  Component Value Date   AST 28 08/08/2023   ALT 21 08/08/2023   ALBUMIN 3.9 08/08/2023   ALKPHOS 68 08/08/2023   LIPASE 36 06/08/2023    Electrolytes Lab Results  Component Value Date   NA 135 08/08/2023   K 3.7 08/08/2023   CL 102 08/08/2023   CALCIUM 8.8 (L) 08/08/2023   MG 1.9 07/13/2022   PHOS 3.2 07/13/2022    Bone Lab Results  Component Value Date   TESTOSTERONE 457 03/27/2023    Inflammation (CRP: Acute Phase) (ESR: Chronic Phase) Lab Results  Component Value Date   CRP 1.5 (H) 08/13/2020   ESRSEDRATE 17 (H) 08/13/2020   LATICACIDVEN 1.2 06/08/2023         Note: Above Lab results reviewed.  Recent Imaging Review  CT Soft Tissue Neck W Contrast CLINICAL DATA:  Epiglottitis or tonsillitis. The patient is currently being treated for thrush. Swelling.  EXAM: CT NECK WITH CONTRAST  TECHNIQUE: Multidetector CT imaging of the neck was performed using the standard protocol following the bolus administration  of intravenous contrast.  RADIATION DOSE REDUCTION: This exam was performed according to the departmental dose-optimization program which includes automated exposure control, adjustment of the mA and/or kV according to patient size and/or use of iterative reconstruction technique.  CONTRAST:  75mL OMNIPAQUE IOHEXOL 300 MG/ML  SOLN  COMPARISON:  One-view neck radiographs 07/30/2022  FINDINGS: Pharynx and larynx: No focal mucosal or submucosal lesions are present. Nasopharynx is clear. The soft palate and tongue base are within normal limits. The oropharynx is unremarkable. Vallecula and epiglottis are within normal limits. Aryepiglottic folds and piriform sinuses are clear. Vocal cords are midline and symmetric. Trachea is clear.  Salivary glands: The submandibular and parotid glands and ducts are within normal limits.  Thyroid: Normal  Lymph nodes: Significant adenopathy is present.  Vascular: Mild atherosclerotic calcifications are present at the aorta great vessel origins bilateral carotid bifurcations without significant stenoses.  Limited intracranial: Within normal limits.  Visualized orbits: Bilateral lens replacements are noted. Globes and orbits are otherwise unremarkable.  Mastoids and visualized paranasal sinuses: The paranasal sinuses and mastoid air cells are clear.  Skeleton: Multilevel degenerative changes are present. No focal osseous lesions are present. Patient is edentulous.  Upper chest: Patchy ground-glass attenuation is present in the upper lobes bilaterally. No pneumothorax is present. Significant pleural effusion is present. No nodule or focal airspace consolidation present.  IMPRESSION: 1. Normal CT appearance of the neck. No acute or focal lesion to explain the patient's symptoms. 2. Patchy ground-glass attenuation in the upper lobes bilaterally is nonspecific, but may be related to edema or infection. 3.  Aortic Atherosclerosis  (ICD10-I70.0).  Electronically Signed   By: Marin Roberts M.D.   On: 08/08/2023 16:44 Note: Reviewed        Physical Exam  General appearance: Well nourished, well developed, and well hydrated. In no apparent acute distress Mental status: Alert, oriented x 3 (person, place, & time)       Respiratory: No evidence of acute respiratory distress Eyes: PERLA Vitals: BP (!) 141/67   Pulse 84   Temp 97.7 F (36.5 C)   Resp 16   Ht 5\' 8"  (1.727 m)   Wt 210 lb (95.3 kg)   SpO2 98%   BMI 31.93 kg/m  BMI: Estimated body mass index is 31.93 kg/m as calculated from the following:   Height as of this encounter: 5\' 8"  (1.727 m).   Weight as of this encounter: 210 lb (95.3 kg). Ideal: Ideal body weight: 68.4 kg (150 lb 12.7 oz) Adjusted ideal body weight: 79.1 kg (174 lb 7.6 oz)  Thoracic Spine Area Exam  Skin & Axial Inspection: Well healed scar from previous spine surgery detected Alignment: Symmetrical Functional ROM: Pain restricted ROM Stability: No instability detected Muscle Tone/Strength: Functionally intact. No obvious neuro-muscular anomalies detected. Sensory (Neurological): Neurogenic pain pattern Muscle strength & Tone: No palpable anomalies   Lumbar Spine Area Exam  Skin & Axial Inspection: Well healed scar from previous spine surgery detected Alignment: Symmetrical Functional ROM: Pain restricted ROM affecting both sides Stability: No instability detected Muscle Tone/Strength: Functionally intact. No obvious neuro-muscular anomalies detected. Sensory (Neurological): Dermatomal pain pattern + Lumbar facet extension positive for pain consistent with facet syndrome Palpation: IPG present     Lower Extremity Exam      Side: Right lower extremity   Side: Left lower extremity  Stability: No instability observed           Stability: No instability observed          Skin & Extremity Inspection: Skin color, temperature, and hair growth are WNL. No peripheral edema or  cyanosis. No masses, redness, swelling, asymmetry, or associated skin lesions. No contractures.   Skin & Extremity Inspection: Skin color, temperature, and hair growth are WNL. No peripheral edema or cyanosis. No masses, redness, swelling, asymmetry, or associated skin lesions. No contractures.  Functional ROM: Pain restricted ROM for hip and knee joints           Functional ROM: Pain restricted ROM for hip and knee joints          Muscle Tone/Strength: Functionally intact. No obvious neuro-muscular anomalies detected.   Muscle Tone/Strength: Functionally intact. No obvious neuro-muscular anomalies detected.  Sensory (Neurological): Arthropathic arthralgia         Sensory (Neurological): Arthropathic arthralgia        DTR: Patellar: deferred today Achilles: deferred today Plantar: deferred today   DTR: Patellar: deferred today Achilles: deferred today Plantar: deferred today  Palpation: No palpable anomalies   Palpation: No palpable anomalies       Assessment   Diagnosis Status  1. Chronic pain syndrome   2. Post laminectomy syndrome   3. Neuroforaminal stenosis of cervical spine   4. Cervical radicular pain (right C5/6)   5. Spinal cord stimulator status (Nevro)      Controlled Controlled Controlled    Plan of Care  Problem-specific:  No problem-specific Assessment & Plan notes found for this encounter.  [name] has a current medication list which includes the following long-term medication(s): aripiprazole, atorvastatin, bupropion, bupropion, clopidogrel, dexcom g6 sensor, dexcom g6 sensor, empagliflozin, insulin lispro, iron (ferrous sulfate), metoclopramide, pantoprazole, testosterone, and tizanidine.  Pharmacotherapy (Medications Ordered): Meds ordered this encounter  Medications   buprenorphine (BUTRANS) 10  MCG/HR PTWK    Sig: Place 1 patch onto the skin once a week.    Dispense:  4 patch    Refill:  3    Chronic Pain: STOP Act (Not applicable) Fill 1 day  early if closed on refill date. Avoid benzodiazepines within 8 hours of opioids   Continue with Nevro SCS, APAP and Tizanidine PRN  Orders:  No orders of the defined types were placed in this encounter.  Follow-up plan:   Return in about 15 weeks (around 11/27/2023) for MM, F2F.    Recent Visits No visits were found meeting these conditions. Showing recent visits within past 90 days and meeting all other requirements Today's Visits Date Type Provider Dept  08/14/23 Office Visit Edward Jolly, MD [facility]  Showing today's visits and meeting all other requirements Future Appointments No visits were found meeting these conditions. Showing future appointments within next 90 days and meeting all other requirements  I discussed the assessment and treatment plan with the patient. The patient was provided an opportunity to ask questions and all were answered. The patient agreed with the plan and demonstrated an understanding of the instructions.  Patient advised to call back or seek an in-person evaluation if the symptoms or condition worsens.  Duration of encounter: .  Total time on encounter, as per AMA guidelines included both the face-to-face and non-face-to-face time personally spent by the physician and/or other qualified health care professional(s) on the day of the encounter (includes time in activities that require the physician or other qualified health care professional and does not include time in activities normally performed by clinical staff). Physician's time may include the following activities when performed: preparing to see the patient (eg, review of tests, pre-charting review of records) obtaining and/or reviewing separately obtained history performing a medically appropriate examination and/or evaluation counseling and educating the patient/family/caregiver ordering medications, tests, or procedures referring and communicating with other health care  professionals (when not separately reported) documenting clinical information in the electronic or other health record independently interpreting results (not separately reported) and communicating results to the patient/ family/caregiver care coordination (not separately reported)  Note by: Edward Jolly, MD Date: 08/14/2023; Time: 11:39 AM

## 2023-08-15 ENCOUNTER — Other Ambulatory Visit: Payer: Self-pay

## 2023-08-15 ENCOUNTER — Other Ambulatory Visit: Payer: Self-pay | Admitting: Family Medicine

## 2023-08-15 ENCOUNTER — Other Ambulatory Visit (HOSPITAL_BASED_OUTPATIENT_CLINIC_OR_DEPARTMENT_OTHER): Payer: Self-pay

## 2023-08-15 ENCOUNTER — Other Ambulatory Visit: Payer: Self-pay | Admitting: Physician Assistant

## 2023-08-15 MED ORDER — ATORVASTATIN CALCIUM 80 MG PO TABS
80.0000 mg | ORAL_TABLET | Freq: Every day | ORAL | 3 refills | Status: DC
  Filled 2023-08-15 – 2023-11-03 (×4): qty 90, 90d supply, fill #0
  Filled 2024-01-27 – 2024-02-24 (×2): qty 90, 90d supply, fill #1

## 2023-08-15 MED ORDER — BUPROPION HCL ER (XL) 300 MG PO TB24
300.0000 mg | ORAL_TABLET | Freq: Every day | ORAL | 0 refills | Status: DC
  Filled 2023-08-15 – 2023-11-03 (×4): qty 90, 90d supply, fill #0

## 2023-08-15 MED ORDER — PANTOPRAZOLE SODIUM 40 MG PO TBEC
40.0000 mg | DELAYED_RELEASE_TABLET | Freq: Every day | ORAL | 0 refills | Status: DC
  Filled 2023-08-15 – 2023-11-03 (×4): qty 90, 90d supply, fill #0

## 2023-08-18 ENCOUNTER — Other Ambulatory Visit (HOSPITAL_BASED_OUTPATIENT_CLINIC_OR_DEPARTMENT_OTHER): Payer: Self-pay

## 2023-08-22 ENCOUNTER — Other Ambulatory Visit (HOSPITAL_BASED_OUTPATIENT_CLINIC_OR_DEPARTMENT_OTHER): Payer: Self-pay

## 2023-08-25 ENCOUNTER — Other Ambulatory Visit (HOSPITAL_BASED_OUTPATIENT_CLINIC_OR_DEPARTMENT_OTHER): Payer: Self-pay

## 2023-08-28 ENCOUNTER — Other Ambulatory Visit (HOSPITAL_BASED_OUTPATIENT_CLINIC_OR_DEPARTMENT_OTHER): Payer: Self-pay

## 2023-09-01 ENCOUNTER — Other Ambulatory Visit (HOSPITAL_BASED_OUTPATIENT_CLINIC_OR_DEPARTMENT_OTHER): Payer: Self-pay

## 2023-09-09 DIAGNOSIS — G4734 Idiopathic sleep related nonobstructive alveolar hypoventilation: Secondary | ICD-10-CM | POA: Diagnosis not present

## 2023-09-15 ENCOUNTER — Other Ambulatory Visit (HOSPITAL_BASED_OUTPATIENT_CLINIC_OR_DEPARTMENT_OTHER): Payer: Self-pay

## 2023-09-15 ENCOUNTER — Emergency Department (HOSPITAL_BASED_OUTPATIENT_CLINIC_OR_DEPARTMENT_OTHER)
Admission: EM | Admit: 2023-09-15 | Discharge: 2023-09-15 | Disposition: A | Discharge status: Home / Self Care | Payer: Commercial Managed Care - PPO

## 2023-09-15 ENCOUNTER — Emergency Department (HOSPITAL_BASED_OUTPATIENT_CLINIC_OR_DEPARTMENT_OTHER): Payer: Commercial Managed Care - PPO

## 2023-09-15 ENCOUNTER — Encounter (HOSPITAL_BASED_OUTPATIENT_CLINIC_OR_DEPARTMENT_OTHER): Payer: Self-pay | Admitting: Emergency Medicine

## 2023-09-15 ENCOUNTER — Other Ambulatory Visit: Payer: Self-pay

## 2023-09-15 DIAGNOSIS — Z794 Long term (current) use of insulin: Secondary | ICD-10-CM | POA: Insufficient documentation

## 2023-09-15 DIAGNOSIS — S199XXA Unspecified injury of neck, initial encounter: Secondary | ICD-10-CM | POA: Diagnosis not present

## 2023-09-15 DIAGNOSIS — E119 Type 2 diabetes mellitus without complications: Secondary | ICD-10-CM | POA: Diagnosis not present

## 2023-09-15 DIAGNOSIS — Z7901 Long term (current) use of anticoagulants: Secondary | ICD-10-CM | POA: Diagnosis not present

## 2023-09-15 DIAGNOSIS — I2510 Atherosclerotic heart disease of native coronary artery without angina pectoris: Secondary | ICD-10-CM | POA: Insufficient documentation

## 2023-09-15 DIAGNOSIS — R9082 White matter disease, unspecified: Secondary | ICD-10-CM | POA: Diagnosis not present

## 2023-09-15 DIAGNOSIS — S0990XA Unspecified injury of head, initial encounter: Secondary | ICD-10-CM | POA: Insufficient documentation

## 2023-09-15 DIAGNOSIS — M19031 Primary osteoarthritis, right wrist: Secondary | ICD-10-CM | POA: Diagnosis not present

## 2023-09-15 DIAGNOSIS — M7651 Patellar tendinitis, right knee: Secondary | ICD-10-CM | POA: Diagnosis not present

## 2023-09-15 DIAGNOSIS — S8001XA Contusion of right knee, initial encounter: Secondary | ICD-10-CM | POA: Diagnosis not present

## 2023-09-15 DIAGNOSIS — S01111A Laceration without foreign body of right eyelid and periocular area, initial encounter: Secondary | ICD-10-CM | POA: Insufficient documentation

## 2023-09-15 DIAGNOSIS — M25561 Pain in right knee: Secondary | ICD-10-CM | POA: Diagnosis not present

## 2023-09-15 DIAGNOSIS — Z23 Encounter for immunization: Secondary | ICD-10-CM | POA: Diagnosis not present

## 2023-09-15 DIAGNOSIS — G319 Degenerative disease of nervous system, unspecified: Secondary | ICD-10-CM | POA: Diagnosis not present

## 2023-09-15 DIAGNOSIS — M1711 Unilateral primary osteoarthritis, right knee: Secondary | ICD-10-CM | POA: Diagnosis not present

## 2023-09-15 DIAGNOSIS — W01198A Fall on same level from slipping, tripping and stumbling with subsequent striking against other object, initial encounter: Secondary | ICD-10-CM | POA: Diagnosis not present

## 2023-09-15 DIAGNOSIS — M25531 Pain in right wrist: Secondary | ICD-10-CM | POA: Diagnosis not present

## 2023-09-15 DIAGNOSIS — M25521 Pain in right elbow: Secondary | ICD-10-CM | POA: Diagnosis not present

## 2023-09-15 MED ORDER — LIDOCAINE-EPINEPHRINE (PF) 2 %-1:200000 IJ SOLN
10.0000 mL | Freq: Once | INTRAMUSCULAR | Status: AC
  Administered 2023-09-15: 10 mL
  Filled 2023-09-15: qty 20

## 2023-09-15 MED ORDER — OXYCODONE-ACETAMINOPHEN 5-325 MG PO TABS
1.0000 | ORAL_TABLET | Freq: Four times a day (QID) | ORAL | 0 refills | Status: DC | PRN
  Filled 2023-09-15: qty 8, 2d supply, fill #0

## 2023-09-15 MED ORDER — TETANUS-DIPHTH-ACELL PERTUSSIS 5-2.5-18.5 LF-MCG/0.5 IM SUSY
0.5000 mL | PREFILLED_SYRINGE | Freq: Once | INTRAMUSCULAR | Status: AC
  Administered 2023-09-15: 0.5 mL via INTRAMUSCULAR
  Filled 2023-09-15: qty 0.5

## 2023-09-15 MED ORDER — OXYCODONE-ACETAMINOPHEN 5-325 MG PO TABS
1.0000 | ORAL_TABLET | Freq: Once | ORAL | Status: AC
  Administered 2023-09-15: 1 via ORAL
  Filled 2023-09-15: qty 1

## 2023-09-15 NOTE — ED Triage Notes (Addendum)
Larey Seat today going up one sep , tripped landed forward on the concrete , facial injury , right wrist and forearm pain . Obvious laceration to right eye lid .  Takes Plavix . Hx cardiac stents ,  Neck pain .  Obvious abrasion to nose . Alert and oriented x 4 . No loc yet reports severe headache  Right knee pain

## 2023-09-15 NOTE — ED Notes (Signed)
MD at the bedside  

## 2023-09-15 NOTE — Discharge Instructions (Addendum)
Your CT scans and x-rays are reassuring.  Please take the pain medication as prescribed and follow-up with your doctor.  Do not drive drink alcohol taking this as may make you drowsy.  Please follow-up with your doctor and return to the ER for worsening symptoms.

## 2023-09-15 NOTE — ED Provider Notes (Addendum)
EMERGENCY DEPARTMENT AT [facility] Provider Note   CSN: [id number] Arrival date & time: 09/15/23  1054     History  Chief Complaint  Patient presents with   Fall    Plavix     Brandon [name] is a 68 y.o. male.  68 year old male with past medical history of diabetes and coronary artery disease on Plavix presenting to the emergency department today after a fall.  The patient lost his footing this morning and fell forward.  He did hit his head.  He did not lose consciousness.  He states he is having a headache since then.  Is also reporting some neck soreness.  He is also complaining of some pain in his right wrist as well as his right knee.  He has been able to ambulate.  This was not a syncopal episode.  He came to the ER today for further evaluation due to these ongoing symptoms.   Fall Associated symptoms include headaches.       Home Medications Prior to Admission medications   Medication Sig Start Date End Date Taking? Authorizing Provider  oxyCODONE-acetaminophen (PERCOCET/ROXICET) 5-325 MG tablet Take 1 tablet by mouth every 6 (six) hours as needed for severe pain (pain score 7-10). 09/15/23  Yes Durwin Glaze, MD  acetaminophen (TYLENOL) 500 MG tablet Take 1,000 mg by mouth See admin instructions. Take 2 tablets (1000 mg) by mouth scheduled in the morning & may take 2 tablets (1000 mg) by mouth every 6 hours if needed for pain.    [provider]  albuterol (VENTOLIN HFA) 108 (90 Base) MCG/ACT inhaler Inhale 2 puffs into the lungs as needed for wheezing or shortness of breath (When sick). 10/29/21   [provider]  AMBULATORY NON FORMULARY MEDICATION Please provide home oxygen for nocturnal use.  Apply 2-4L of O2 via nasal cannula at bedtime.  Dx: Nocturnal Oxygen Desaturation G47.34 11/06/22   Everrett Coombe, DO  AMBULATORY NON FORMULARY MEDICATION Please provide humidifier for home oxygen due to nasal irritation with use. 11/13/22    Everrett Coombe, DO  ARIPiprazole (ABILIFY) 10 MG tablet Take 1 tablet (10 mg total) by mouth every morning. 05/23/23   Everrett Coombe, DO  atorvastatin (LIPITOR) 80 MG tablet Take 1 tablet (80 mg total) by mouth daily. 08/15/23   Tereso Newcomer T, PA-C  b complex vitamins capsule Take 1 capsule by mouth in the morning.    [provider]  buprenorphine (BUTRANS) 10 MCG/HR PTWK Place 1 patch onto the skin once a week. 08/14/23 12/04/23  Edward Jolly, MD  buPROPion (WELLBUTRIN XL) 150 MG 24 hr tablet Take 1 tablet (150 mg total) by mouth daily in addition to 300mg  tablet. 03/25/23   Everrett Coombe, DO  buPROPion (WELLBUTRIN XL) 300 MG 24 hr tablet Take 1 tablet (300 mg total) by mouth daily. 08/15/23   Everrett Coombe, DO  clopidogrel (PLAVIX) 75 MG tablet Take 1 tablet (75 mg total) by mouth daily with breakfast. 12/20/22   Jenel Lucks, MD  Continuous Glucose Sensor (DEXCOM G6 SENSOR) MISC Use as directed. Change sensor every 10 days 03/11/23   Everrett Coombe, DO  Continuous Glucose Sensor (DEXCOM G6 SENSOR) MISC Use as directed. Change sensor every 10 days 05/29/23   Everrett Coombe, DO  docusate sodium (COLACE) 100 MG capsule Take 2 capsules (200 mg total) by mouth in the morning. 10/24/22   Everrett Coombe, DO  empagliflozin (JARDIANCE) 25 MG TABS tablet Take 1 tablet (25 mg  total) by mouth daily. 10/22/22   Everrett Coombe, DO  GVOKE HYPOPEN 1-PACK 1 MG/0.2ML SOAJ Use as needed for hypoglycemia 05/13/22   Carlus Pavlov, MD  insulin lispro (HUMALOG) 100 UNIT/ML injection For use in pump, total of 60 units per day 08/07/23   Carlus Pavlov, MD  Iron, Ferrous Sulfate, 325 (65 Fe) MG TABS Take 1 tablet (325 mg) by mouth daily. 05/21/23   Everrett Coombe, DO  magic mouthwash (nystatin, lidocaine, diphenhydrAMINE, alum & mag hydroxide) suspension Swish and spit 5 mLs 3 (three) times daily as needed for mouth pain. 07/21/23   Everrett Coombe, DO  magnesium oxide (MAG-OX) 400 (240 Mg) MG tablet  Take 800 mg by mouth in the morning.    [provider]  metoCLOPramide (REGLAN) 10 MG tablet Take 1 tablet (10 mg total) by mouth daily as needed for nausea or vomiting (upset stomach). 10/24/22   Everrett Coombe, DO  Multiple Vitamin (MULTI-VITAMIN) tablet Take 1 tablet by mouth daily. 02/14/23   Everrett Coombe, DO  nitroGLYCERIN (NITROSTAT) 0.4 MG SL tablet Place 0.4 mg under the tongue every 5 (five) minutes x 3 doses as needed for chest pain.    [provider]  ondansetron (ZOFRAN-ODT) 4 MG disintegrating tablet Take 1 tablet (4 mg total) by mouth every 8 (eight) hours as needed for nausea or vomiting. 08/14/23   Everrett Coombe, DO  pantoprazole (PROTONIX) 40 MG tablet Take 1 tablet (40 mg total) by mouth daily. 08/15/23   Everrett Coombe, DO  polyethylene glycol powder (MIRALAX) 17 GM/SCOOP powder Mix 17 grams (1 capful) in 8 oz of liquid and drink once a day 06/11/23   Burnadette Pop, MD  tamsulosin (FLOMAX) 0.4 MG CAPS capsule Take 1 capsule (0.4 mg total) by mouth daily. 02/11/23   Everrett Coombe, DO  testosterone (ANDROGEL) 50 MG/5GM (1%) GEL Place 5 g (1 packet) onto the shoulders and upper arms daily as directed. 05/23/23 09/13/23  Everrett Coombe, DO  tiZANidine (ZANAFLEX) 4 MG tablet Take 1-1&1/2 tablets (4-6 mg total) by mouth every 8 (eight) hours as needed for muscle spasms. 09/04/22   Quintella Reichert, MD      Allergies    Kiwi extract, Other, Penicillins, Ancef [cefazolin], Latex, Levaquin [levofloxacin], and Peach [prunus persica]    Review of Systems   Review of Systems  Musculoskeletal:  Positive for neck pain.  Neurological:  Positive for headaches.  All other systems reviewed and are negative.   Physical Exam Updated Vital Signs BP (!) 158/77   Pulse 68   Temp 98.4 F (36.9 C) (Oral)   Resp 16   Ht 5\' 8"  (1.727 m)   Wt 92.5 kg   SpO2 99%   BMI 31.02 kg/m  Physical Exam Vitals and nursing note reviewed.   Gen: NAD Eyes: PERRL, EOMI HEENT: no  oropharyngeal swelling, 1 cm laceration noted over the left eyebrow with no active bleeding noted Neck: trachea midline, the patient is tender over the mid cervical spine with no step-offs or deformities Resp: clear to auscultation bilaterally Card: RRR, no murmurs, rubs, or gallops Abd: nontender, nondistended MSK: The patient is tender over the distal third of the radius/ulna with no obvious deformity noted, the patient is tender over the right patella and lateral joint line with normal range of motion noted Vascular: 2+ radial pulses bilaterally, 2+ DP pulses bilaterally Skin: no rashes Psyc: acting appropriately   ED Results / Procedures / Treatments   Labs (all labs ordered are listed, but only  abnormal results are displayed) Labs Reviewed - No data to display  EKG None  Radiology DG Elbow Complete Right Result Date: 09/15/2023 CLINICAL DATA:  Pain status post fall. EXAM: RIGHT ELBOW - COMPLETE 3+ VIEW COMPARISON:  None Available. FINDINGS: There is no evidence of fracture, dislocation, or joint effusion. There is no evidence of arthropathy or other focal bone abnormality. Mild soft tissue swelling of the elbow. IMPRESSION: No acute fracture or dislocation of the right elbow. Electronically Signed   By: Hart Robinsons M.D.   On: 09/15/2023 12:58   DG Wrist Complete Right Result Date: 09/15/2023 CLINICAL DATA:  Pain status post fall. EXAM: RIGHT WRIST - COMPLETE 3+ VIEW COMPARISON:  None Available. FINDINGS: There is no evidence of acute fracture or dislocation. The carpal rows are intact and demonstrate normal alignment. Mild radiocarpal joint space narrowing. Mild degenerative changes at the triscaphe and first [facility] articulations. The joint spaces are preserved. No significant soft tissue abnormalities are seen. IMPRESSION: No acute osseous abnormality. Electronically Signed   By: Hart Robinsons M.D.   On: 09/15/2023 12:55   DG Knee Complete 4 Views Right Result Date:  09/15/2023 CLINICAL DATA:  Pain status post fall. EXAM: RIGHT KNEE - COMPLETE 4+ VIEW COMPARISON:  None Available. FINDINGS: No evidence of acute fracture, dislocation, or joint effusion. Extensor mechanism enthesopathy of the patella. Mild degenerative changes of the knee. Mild prepatellar soft tissue swelling. IMPRESSION: No acute osseous abnormality. Electronically Signed   By: Hart Robinsons M.D.   On: 09/15/2023 12:54   CT Head Wo Contrast Result Date: 09/15/2023 CLINICAL DATA:  MHP-CTHead trauma, minor (Age >= 65y); Neck trauma (Age >= 65y) tripped going up stairs. Laceration to face. EXAM: CT HEAD WITHOUT CONTRAST CT CERVICAL SPINE WITHOUT CONTRAST TECHNIQUE: Multidetector CT imaging of the head and cervical spine was performed following the standard protocol without intravenous contrast. Multiplanar CT image reconstructions of the cervical spine were also generated. RADIATION DOSE REDUCTION: This exam was performed according to the departmental dose-optimization program which includes automated exposure control, adjustment of the mA and/or kV according to patient size and/or use of iterative reconstruction technique. COMPARISON:  Head CT 01/01/2022, neck CT 08/08/2023 FINDINGS: CT HEAD FINDINGS Brain: No acute intracranial hemorrhage. No focal mass lesion. No CT evidence of acute infarction. No midline shift or mass effect. No hydrocephalus. Basilar cisterns are patent. There are mild periventricular and subcortical white matter hypodensities. Mild generalized cortical atrophy. Vascular: No hyperdense vessel or unexpected calcification. Skull: Normal. Negative for fracture or focal lesion. Sinuses/Orbits: Paranasal sinuses and mastoid air cells are clear. Orbits are clear. Other: Small subcutaneous gas over the superior aspect of the RIGHT orbit (image 18/series 302. No associated skull fracture. No orbital injury. No fluid in the subjacent RIGHT frontal sinus. CT CERVICAL SPINE FINDINGS Alignment:  Normal alignment of the cervical vertebral bodies. Skull base and vertebrae: Normal craniocervical junction. No loss of vertebral body height or disc height. Normal facet articulation. No evidence of fracture. Soft tissues and spinal canal: No prevertebral soft tissue swelling. No perispinal or epidural hematoma. Disc levels: Anterior osteophytosis at C2-C3 and C5-C7. No acute findings. Upper chest: Clear Other: Bilateral cervical level 2 lymph nodes are numerous but not pathologic by size criteria. IMPRESSION: HEAD CT: 1. No acute intracranial findings. 2. Mild atrophy and white matter microvascular disease. 3. Subcutaneous soft tissue swelling over the RIGHT orbit with small volume of subcutaneous gas. No evidence of skull fracture, orbital injury or sinus fluid. CERVICAL SPINE CT: 1.  No cervical spine fracture. 2. No traumatic subluxation. 3. Multilevel disc osteophytic disease. Electronically Signed   By: Genevive Bi M.D.   On: 09/15/2023 12:15   CT Cervical Spine Wo Contrast Result Date: 09/15/2023 CLINICAL DATA:  MHP-CTHead trauma, minor (Age >= 65y); Neck trauma (Age >= 65y) tripped going up stairs. Laceration to face. EXAM: CT HEAD WITHOUT CONTRAST CT CERVICAL SPINE WITHOUT CONTRAST TECHNIQUE: Multidetector CT imaging of the head and cervical spine was performed following the standard protocol without intravenous contrast. Multiplanar CT image reconstructions of the cervical spine were also generated. RADIATION DOSE REDUCTION: This exam was performed according to the departmental dose-optimization program which includes automated exposure control, adjustment of the mA and/or kV according to patient size and/or use of iterative reconstruction technique. COMPARISON:  Head CT 01/01/2022, neck CT 08/08/2023 FINDINGS: CT HEAD FINDINGS Brain: No acute intracranial hemorrhage. No focal mass lesion. No CT evidence of acute infarction. No midline shift or mass effect. No hydrocephalus. Basilar cisterns are  patent. There are mild periventricular and subcortical white matter hypodensities. Mild generalized cortical atrophy. Vascular: No hyperdense vessel or unexpected calcification. Skull: Normal. Negative for fracture or focal lesion. Sinuses/Orbits: Paranasal sinuses and mastoid air cells are clear. Orbits are clear. Other: Small subcutaneous gas over the superior aspect of the RIGHT orbit (image 18/series 302. No associated skull fracture. No orbital injury. No fluid in the subjacent RIGHT frontal sinus. CT CERVICAL SPINE FINDINGS Alignment: Normal alignment of the cervical vertebral bodies. Skull base and vertebrae: Normal craniocervical junction. No loss of vertebral body height or disc height. Normal facet articulation. No evidence of fracture. Soft tissues and spinal canal: No prevertebral soft tissue swelling. No perispinal or epidural hematoma. Disc levels: Anterior osteophytosis at C2-C3 and C5-C7. No acute findings. Upper chest: Clear Other: Bilateral cervical level 2 lymph nodes are numerous but not pathologic by size criteria. IMPRESSION: HEAD CT: 1. No acute intracranial findings. 2. Mild atrophy and white matter microvascular disease. 3. Subcutaneous soft tissue swelling over the RIGHT orbit with small volume of subcutaneous gas. No evidence of skull fracture, orbital injury or sinus fluid. CERVICAL SPINE CT: 1. No cervical spine fracture. 2. No traumatic subluxation. 3. Multilevel disc osteophytic disease. Electronically Signed   By: Genevive Bi M.D.   On: 09/15/2023 12:15    Procedures .Laceration Repair  Date/Time: 09/15/2023 2:05 PM  Performed by: Durwin Glaze, MD Authorized by: Durwin Glaze, MD   Consent:    Consent obtained:  Verbal   Consent given by:  Patient   Risks, benefits, and alternatives were discussed: yes     Risks discussed:  Infection and pain   Alternatives discussed:  No treatment Anesthesia:    Anesthesia method:  Local infiltration   Local anesthetic:   Lidocaine 2% WITH epi Laceration details:    Location:  Face   Face location:  R eyebrow   Length (cm):  1   Depth (mm):  3 Pre-procedure details:    Preparation:  Patient was prepped and draped in usual sterile fashion and imaging obtained to evaluate for foreign bodies Exploration:    Limited defect created (wound extended): no     Hemostasis achieved with:  Epinephrine   Wound extent: no signs of injury, no underlying fracture and no vascular damage     Contaminated: no   Treatment:    Area cleansed with:  Shur-Clens   Amount of cleaning:  Standard   Debridement:  None   Undermining:  None  Scar revision: no   Skin repair:    Repair method:  Sutures   Suture size:  6-0   Suture material:  Prolene   Suture technique:  Simple interrupted   Number of sutures:  2 Approximation:    Approximation:  Close Repair type:    Repair type:  Simple Post-procedure details:    Dressing:  Antibiotic ointment   Procedure completion:  Tolerated     Medications Ordered in ED Medications  oxyCODONE-acetaminophen (PERCOCET/ROXICET) 5-325 MG per tablet 1 tablet (1 tablet Oral Given 09/15/23 1346)  lidocaine-EPINEPHrine (XYLOCAINE W/EPI) 2 %-1:200000 (PF) injection 10 mL (10 mLs Infiltration Given 09/15/23 1347)  Tdap (BOOSTRIX) injection 0.5 mL (0.5 mLs Intramuscular Given 09/15/23 1408)    ED Course/ Medical Decision Making/ A&P                                 Medical Decision Making 67 year old male with past medical history of diabetes and coronary artery disease on Plavix presenting to the emergency department today with a head injury as well as pain in his right wrist and right knee after a mechanical fall at home.  I will further evaluate the patient here with a CT scan of his head and cervical spine as well as x-rays of his right wrist and right knee for further evaluation for acute traumatic injuries.  The patient is otherwise neurovascularly intact.  I think that if his workup is  reassuring that he may be discharged.  I will repair his laceration.  Patient CT scan here negative.  X-rays do not show any acute traumatic injuries.  The patient's laceration was repaired here.  He is discharged with return precautions.  The patient is on buprenorphine chronically.  He is having a lot more pain than normal and does have an exacerbation of his chronic pain.  I will give him a 2-day course of pain medication and to call discussed this with pharmacy prior to prescribing this.  Amount and/or Complexity of Data Reviewed Radiology: ordered.  Risk Prescription drug management.           Final Clinical Impression(s) / ED Diagnoses Final diagnoses:  Eyebrow laceration, right, initial encounter  Closed head injury, initial encounter  Contusion of right knee, initial encounter    Rx / DC Orders ED Discharge Orders          Ordered    oxyCODONE-acetaminophen (PERCOCET/ROXICET) 5-325 MG tablet  Every 6 hours PRN        09/15/23 1413              Durwin Glaze, MD 09/15/23 1414    Durwin Glaze, MD 09/15/23 1415

## 2023-09-15 NOTE — ED Notes (Signed)
Fall risk armband Fall risk sign on door Patient wearing shoes

## 2023-09-16 ENCOUNTER — Ambulatory Visit: Payer: Self-pay | Admitting: Family Medicine

## 2023-09-16 NOTE — Telephone Encounter (Signed)
Chief Complaint: Arm Pain Symptoms: swelling, pain and small amount of range of motion Frequency: since yesterday Pertinent Negatives: Patient denies CP, SOB, dizziness Disposition: [] ED /[] Urgent Care (no appt availability in office) / [x] Appointment(In office/virtual)/ []  [place] [facility]/ [] Home Care/ [] Refused Recommended Disposition /[]  Mobile Bus/ []  Follow-up with PCP Additional Notes: patient c/o right arm pain s/p fall yesterday. Patient states he was walking up cement stairs and missed a step falling forward. Patient hit his head and right arm. Patient did go to the ED and was evaluated. Patient is concerned today about his right arm. Endorses pain, swelling and little range of motion of his wrist. Per recommendation, patient scheduled for appointment 09/17/2023 at 11:30 am.  Patient verbalized understanding of plan and all questions answered.      Copied from CRM [id number]. Topic: Clinical - Red Word Triage >> Sep 16, 2023  9:33 AM Nila Nephew wrote: Red Word that prompted transfer to Nurse Triage: Fall - hit head and arm - tripped and fell off a step, landed first first into concrete. Reason for Disposition  Can't move injured arm normally (bend or straighten completely)  Answer Assessment - Initial Assessment Questions 1. MECHANISM: "How did the injury happen?"     Tripped going up a step 2. ONSET: "When did the injury happen?" (Minutes or hours ago)      Occurred 1015 yesterday morning 3. LOCATION: "Where is the injury located?" "Which arm?"     Right arm 4. APPEARANCE of INJURY: "What does the injury look like?"      Swollen with little range of motion in wrist area 5. SEVERITY: "Can you use the arm normally?"      No 6. SWELLING or BRUISING: "is there any swelling or bruising?" If Yes, ask: "How large is it? (e.g., inches, centimeters)      Is swollen-patient unable to tell how swollen 7. PAIN: "Is there pain?" If Yes, ask: "How bad is the pain?"    (Scale  1-10; or mild, moderate, severe)   - NONE (0): No pain.   - MILD (1-3): Doesn't interfere with normal activities.   - MODERATE (4-7): Interferes with normal activities (e.g., work or school) or awakens from sleep.   - SEVERE (8-10): Excruciating pain, unable to do any normal activities, unable to hold a cup of water.     Moderate-5 out of 10 8. TETANUS: For any breaks in the skin, ask: "When was the last tetanus booster?"     09/15/2023 during ED visit 9. OTHER SYMPTOMS: "Do you have any other symptoms?"  (e.g., numbness in hand)     Headache  Answer Assessment - Initial Assessment Questions 1. ONSET: "When did the pain start?"     S/p fall 2. LOCATION: "Where is the pain located?"     Right arm 3. PAIN: "How bad is the pain?" (Scale 1-10; or mild, moderate, severe)   - MILD (1-3): Doesn't interfere with normal activities.   - MODERATE (4-7): Interferes with normal activities (e.g., work or school) or awakens from sleep.   - SEVERE (8-10): Excruciating pain, unable to do any normal activities, unable to hold a cup of water.     5 out of 10 4. WORK OR EXERCISE: "Has there been any recent work or exercise that involved this part of the body?"     no 5. CAUSE: "What do you think is causing the arm pain?"     Patient fell face first into concrete hitting head  and arm 6. OTHER SYMPTOMS: "Do you have any other symptoms?" (e.g., neck pain, swelling, rash, fever, numbness, weakness)     headache  Protocols used: Arm Pain-A-AH, Arm Injury-A-AH

## 2023-09-17 ENCOUNTER — Ambulatory Visit (INDEPENDENT_AMBULATORY_CARE_PROVIDER_SITE_OTHER): Payer: Commercial Managed Care - PPO | Admitting: Physician Assistant

## 2023-09-17 ENCOUNTER — Other Ambulatory Visit (HOSPITAL_BASED_OUTPATIENT_CLINIC_OR_DEPARTMENT_OTHER): Payer: Self-pay

## 2023-09-17 ENCOUNTER — Encounter: Payer: Self-pay | Admitting: Physician Assistant

## 2023-09-17 VITALS — BP 155/72 | HR 86 | Ht 68.0 in | Wt 210.0 lb

## 2023-09-17 DIAGNOSIS — S01111D Laceration without foreign body of right eyelid and periocular area, subsequent encounter: Secondary | ICD-10-CM | POA: Diagnosis not present

## 2023-09-17 DIAGNOSIS — W19XXXD Unspecified fall, subsequent encounter: Secondary | ICD-10-CM | POA: Diagnosis not present

## 2023-09-17 DIAGNOSIS — R519 Headache, unspecified: Secondary | ICD-10-CM

## 2023-09-17 DIAGNOSIS — S01111A Laceration without foreign body of right eyelid and periocular area, initial encounter: Secondary | ICD-10-CM | POA: Insufficient documentation

## 2023-09-17 DIAGNOSIS — W19XXXA Unspecified fall, initial encounter: Secondary | ICD-10-CM | POA: Insufficient documentation

## 2023-09-17 DIAGNOSIS — M25561 Pain in right knee: Secondary | ICD-10-CM

## 2023-09-17 DIAGNOSIS — M25531 Pain in right wrist: Secondary | ICD-10-CM

## 2023-09-17 MED ORDER — OXYCODONE-ACETAMINOPHEN 5-325 MG PO TABS
1.0000 | ORAL_TABLET | Freq: Four times a day (QID) | ORAL | 0 refills | Status: DC | PRN
  Filled 2023-09-17: qty 12, 3d supply, fill #0

## 2023-09-17 NOTE — Patient Instructions (Signed)
Lots of ice  Oxycodone as needed for severe pain then tylenol

## 2023-09-17 NOTE — Progress Notes (Signed)
Established Patient Office Visit  Subjective   Patient ID: Brandon [name], male    DOB: [birth year]  Age: 68 y.o. MRN: [id number]  Chief Complaint  Patient presents with   Hospitalization Follow-up    Pt Presented to the emergency department 09/15/23 after a fall.  The patient lost his footing this morning and fell forward.  He did hit his head.  He did not lose consciousness.  He states he is having a headache since then.  Is also reporting some neck soreness.  He is also complaining of some pain in his right wrist as well as his right knee.  He has been able to ambulate.  This was not a syncopal episode.    HPI Pt is a 68 yo male who presents to the clinic to follow up after fall on 1/20 after losing his footing and falling forward. He denies any dizziness. He did not lose consciousness. He went to ED and CT of head, xrays of right wrist/elbow/knee showed no fracture. He was given oxycodone and 2 sutures in right eye brow.   He continues to have pain and swelling over right eye, knee, wrist. The pain in wrist is with any flexion. He cannot take NSAIDS.   ROS   See HPI.  Objective:     BP (!) 155/72   Pulse 86   Ht 5\' 8"  (1.727 m)   Wt 210 lb (95.3 kg)   SpO2 99%   BMI 31.93 kg/m  BP Readings from Last 3 Encounters:  09/17/23 (!) 155/72  09/15/23 (!) 158/77  08/14/23 (!) 141/67   Wt Readings from Last 3 Encounters:  09/17/23 210 lb (95.3 kg)  09/15/23 204 lb (92.5 kg)  08/14/23 210 lb (95.3 kg)      Physical Exam Eyes:     Comments: Right upper eyelid swollen with 2 sutures present in his right eye brow with clotted blood  Cardiovascular:     Rate and Rhythm: Normal rate.  Pulmonary:     Effort: Pulmonary effort is normal.  Musculoskeletal:     Cervical back: Normal range of motion and neck supple.     Right lower leg: No edema.     Left lower leg: No edema.     Comments: Pain with flexion of right wrist and slight dorsal swelling of right hand. Tender to  palpation over dorsal right hand into wrist. No pain to palpation over right thumb or fingers.   NROM and strength of right knee Bruising of medial knee with some slight edema  Neurological:     General: No focal deficit present.     Mental Status: He is alert and oriented to person, place, and time.  Psychiatric:        Mood and Affect: Mood normal.         Assessment & Plan:  Marland KitchenMarland KitchenAzim was seen today for hospitalization follow-up.  Diagnoses and all orders for this visit:  Fall, subsequent encounter -     oxyCODONE-acetaminophen (PERCOCET/ROXICET) 5-325 MG tablet; Take 1 tablet by mouth every 6 (six) hours as needed for severe pain (pain score 7-10).  Right wrist pain -     oxyCODONE-acetaminophen (PERCOCET/ROXICET) 5-325 MG tablet; Take 1 tablet by mouth every 6 (six) hours as needed for severe pain (pain score 7-10).  Acute pain of right knee -     oxyCODONE-acetaminophen (PERCOCET/ROXICET) 5-325 MG tablet; Take 1 tablet by mouth every 6 (six) hours as needed for severe pain (pain score 7-10).  Right facial pain -     oxyCODONE-acetaminophen (PERCOCET/ROXICET) 5-325 MG tablet; Take 1 tablet by mouth every 6 (six) hours as needed for severe pain (pain score 7-10).  Laceration of right eyebrow, subsequent encounter   Reviewed ED work up Reassured no fractures Encouraged lots of ice and rest Too soon to take out sutures come back in 5 days Placed in right wrist splint for 2 weeks then follow up with PCP A few more days of oxycodone given .Marland KitchenPDMP reviewed during this encounter. Then use tylenol.   Return in about 5 days (around 09/22/2023) for suture removal then 2 weeks with PCP for right wrist pain.    Tandy Gaw, PA-C

## 2023-09-18 NOTE — Telephone Encounter (Signed)
Patient seen in office yesterday 09/17/23

## 2023-09-22 ENCOUNTER — Ambulatory Visit: Payer: Medicare Other | Admitting: Physician Assistant

## 2023-09-24 DIAGNOSIS — E119 Type 2 diabetes mellitus without complications: Secondary | ICD-10-CM | POA: Diagnosis not present

## 2023-09-24 DIAGNOSIS — L814 Other melanin hyperpigmentation: Secondary | ICD-10-CM | POA: Diagnosis not present

## 2023-09-24 DIAGNOSIS — Z85828 Personal history of other malignant neoplasm of skin: Secondary | ICD-10-CM | POA: Diagnosis not present

## 2023-09-24 DIAGNOSIS — D225 Melanocytic nevi of trunk: Secondary | ICD-10-CM | POA: Diagnosis not present

## 2023-09-24 DIAGNOSIS — C4441 Basal cell carcinoma of skin of scalp and neck: Secondary | ICD-10-CM | POA: Diagnosis not present

## 2023-09-24 DIAGNOSIS — L821 Other seborrheic keratosis: Secondary | ICD-10-CM | POA: Diagnosis not present

## 2023-09-24 DIAGNOSIS — Z08 Encounter for follow-up examination after completed treatment for malignant neoplasm: Secondary | ICD-10-CM | POA: Diagnosis not present

## 2023-09-24 DIAGNOSIS — D492 Neoplasm of unspecified behavior of bone, soft tissue, and skin: Secondary | ICD-10-CM | POA: Diagnosis not present

## 2023-09-24 DIAGNOSIS — L538 Other specified erythematous conditions: Secondary | ICD-10-CM | POA: Diagnosis not present

## 2023-09-29 ENCOUNTER — Ambulatory Visit: Payer: 59 | Admitting: Family Medicine

## 2023-10-10 DIAGNOSIS — G4734 Idiopathic sleep related nonobstructive alveolar hypoventilation: Secondary | ICD-10-CM | POA: Diagnosis not present

## 2023-10-23 ENCOUNTER — Other Ambulatory Visit: Payer: Self-pay | Admitting: Family Medicine

## 2023-10-27 ENCOUNTER — Other Ambulatory Visit (HOSPITAL_BASED_OUTPATIENT_CLINIC_OR_DEPARTMENT_OTHER): Payer: Self-pay

## 2023-10-27 MED ORDER — EMPAGLIFLOZIN 25 MG PO TABS
25.0000 mg | ORAL_TABLET | Freq: Every day | ORAL | 3 refills | Status: AC
  Filled 2023-11-03: qty 90, 90d supply, fill #0
  Filled 2023-11-05 – 2024-02-24 (×3): qty 90, 90d supply, fill #1

## 2023-10-28 ENCOUNTER — Ambulatory Visit: Payer: Self-pay | Admitting: Family Medicine

## 2023-10-28 NOTE — Telephone Encounter (Signed)
  Chief Complaint: fall 09/15/23- still having symptoms from facial injury Symptoms: soreness, puffiness over R eye Frequency: continued symptoms Pertinent Negatives: Patient denies vision changes  Disposition: [] ED /[] Urgent Care (no appt availability in office) / [x] Appointment(In office/virtual)/ []  [facility]/ [] Home Care/ [] Refused Recommended Disposition /[] [facility]/ []  Follow-up with PCP Additional Notes: Patient has been scheduled for follow up to injury   Copied from CRM [id number]. Topic: Clinical - Red Word Triage >> Oct 28, 2023  9:18 AM Nila Nephew wrote: Red Word that prompted transfer to Nurse Triage: Fall in January - place on face is concerning Reason for Disposition  [1] Face pain or swelling AND [2] present > 7 days  Answer Assessment - Initial Assessment Questions 1. MECHANISM: "How did the injury happen?"      [facility] January 20- injured face on concert- patient was seen and had 4 stitches. Patient reports sore area, "ridge" over orbit bane -R side 2. ONSET: "When did the injury happen?" (Minutes or hours ago)     Never has felt "right" still sore- very prominent  3. LOCATION: "What part of the face is injured?"     R eyebrow 4. APPEARANCE of INJURY: "What does the face look like?"     Stays "puffy" 5. BLEEDING: "Is it bleeding now?" If Yes, ask: "Is it difficult to stop?"     na 6. PAIN: "Is there pain?" If Yes, ask: "How bad is the pain?"  (e.g., Scale 1-10; or mild, moderate, severe)     4/10- does not change- sensitive to touch  Protocols used: Face Injury-A-AH

## 2023-10-29 NOTE — Telephone Encounter (Signed)
 Patient has upcoming appt 10/30/23 with DR. Ashley Royalty.

## 2023-10-30 ENCOUNTER — Ambulatory Visit (INDEPENDENT_AMBULATORY_CARE_PROVIDER_SITE_OTHER): Admitting: Family Medicine

## 2023-10-30 ENCOUNTER — Encounter: Payer: Self-pay | Admitting: Family Medicine

## 2023-10-30 ENCOUNTER — Ambulatory Visit (INDEPENDENT_AMBULATORY_CARE_PROVIDER_SITE_OTHER)

## 2023-10-30 VITALS — BP 159/82 | HR 84 | Ht 68.0 in | Wt 206.0 lb

## 2023-10-30 DIAGNOSIS — S0993XA Unspecified injury of face, initial encounter: Secondary | ICD-10-CM

## 2023-10-30 DIAGNOSIS — F3341 Major depressive disorder, recurrent, in partial remission: Secondary | ICD-10-CM | POA: Diagnosis not present

## 2023-10-30 NOTE — Assessment & Plan Note (Signed)
 He does have palpable nodule above the right eyebrow.  This is quite tender to touch.  This is fairly close to the supraorbital foramen.  CT maxillofacial ordered.  May need ENT consultation as well.

## 2023-10-30 NOTE — Assessment & Plan Note (Signed)
 Some of her stressors related to the separation from his wife as well as financial issues.  He feels the bupropion is still working well for him.  Overall he is handling this pretty well.  He will let me know if having new or worsening symptoms

## 2023-10-30 NOTE — Progress Notes (Signed)
 Brandon [name] - 68 y.o. male MRN [id number]  Date of birth: [birth year]  Subjective Chief Complaint  Patient presents with   Facial Injury    HPI Brandon [name] is a 68 y.o. male here today for follow-up visit.  Had a fall in January resulting in laceration of his face.  Reports that area around laceration has remained sore and he has a knot over this area.  Area is tender to touch but also tender if raising around his arm squinting.  Denies numbness or tingling over this area.  He did have a CT scan of the head but no dedicated maxillofacial imaging at the time of initial injury.  He reports he is doing okay.  Reports that he and his wife have recently separated.  He reports that this has been pretty amicable.  He said several times related to this including poor financial decisions as well as engaging in online relationship and she has been communicating more with her ex-husband.  He does feel that his bupropion continues to work well.  Will stressors right now as that finances are pretty tight for him.  He is continuing to work.  Your cousin  ROS:  A comprehensive ROS was completed and negative except as noted per HPI    Allergies  Allergen Reactions   Kiwi Extract Swelling    Mouth swelling.    Other Swelling and Other (See Comments)    EGGPLANT. Mouth swelling.   Penicillins Rash    Reaction: unknown   Ancef [Cefazolin] Rash   Latex Rash   Levaquin [Levofloxacin] Rash   Peach [Prunus Persica] Other (See Comments)    Burns mouth    Past Medical History:  Diagnosis Date   Anxiety    Arthritis    Basal cell carcinoma (BCC) in situ of skin    left neck   Chronic back pain    has spinal cord stimulator and wears buprenorphine patch   Colon polyps    Coronary artery disease    S/p DES to mRCA in 12/21 // S/p DES to LCx x 2 in 12/2020 // Diff dz in small to mod Dx (not a good target for PCI); OM1 100 CTO; severe dz in small OM2 and PDA >> Med Rx   Depression    Diabetes  mellitus without complication (HCC)    type 2   Fibromyalgia    Gallstones    GERD (gastroesophageal reflux disease)    Hepatitis B    HLD (hyperlipidemia)    Hypertension    Osteoarthritis    Pneumonia    PONV (postoperative nausea and vomiting)    SBO (small bowel obstruction) (HCC)    Sleep apnea    does not use CPAP    Past Surgical History:  Procedure Laterality Date   CARDIAC CATHETERIZATION  01/17/2021   CATARACT EXTRACTION W/ INTRAOCULAR LENS  IMPLANT, BILATERAL     CHOLECYSTECTOMY     CHOLECYSTECTOMY, LAPAROSCOPIC     COLONOSCOPY WITH PROPOFOL N/A 12/19/2022   Procedure: COLONOSCOPY WITH PROPOFOL;  Surgeon: Jenel Lucks, MD;  Location: [facility] ENDOSCOPY;  Service: Gastroenterology;  Laterality: N/A;   CORONARY STENT INTERVENTION N/A 08/15/2020   Procedure: CORONARY STENT INTERVENTION;  Surgeon: Corky Crafts, MD;  Location: [facility] INVASIVE CV LAB;  Service: Cardiovascular;  Laterality: N/A;   CORONARY STENT INTERVENTION N/A 01/17/2021   Procedure: CORONARY STENT INTERVENTION;  Surgeon: Kathleene Hazel, MD;  Location: [facility] INVASIVE CV LAB;  Service: Cardiovascular;  Laterality: N/A;  CORONARY ULTRASOUND/IVUS N/A 08/15/2020   Procedure: Intravascular Ultrasound/IVUS;  Surgeon: Corky Crafts, MD;  Location: [facility] INVASIVE CV LAB;  Service: Cardiovascular;  Laterality: N/A;   DRUG INDUCED ENDOSCOPY N/A 05/23/2022   Procedure: DRUG INDUCED SLEEP ENDOSCOPY;  Surgeon: Osborn Coho, MD;  Location[facility] OR;  Service: ENT;  Laterality: N/A;   HERNIA REPAIR     IMPLANTATION OF HYPOGLOSSAL NERVE STIMULATOR Right 07/30/2022   Procedure: IMPLANTATION OF HYPOGLOSSAL NERVE STIMULATOR;  Surgeon: Osborn Coho, MD;  Location: [facility];  Service: ENT;  Laterality: Right;   LEFT HEART CATH AND CORONARY ANGIOGRAPHY N/A 08/15/2020   Procedure: LEFT HEART CATH AND CORONARY ANGIOGRAPHY;  Surgeon: Corky Crafts, MD;  Location: [facility] INVASIVE CV LAB;   Service: Cardiovascular;  Laterality: N/A;   LEFT HEART CATH AND CORONARY ANGIOGRAPHY N/A 01/17/2021   Procedure: LEFT HEART CATH AND CORONARY ANGIOGRAPHY;  Surgeon: Kathleene Hazel, MD;  Location: [facility] INVASIVE CV LAB;  Service: Cardiovascular;  Laterality: N/A;   LUMBAR FUSION     L3-5 with rods   MICRODISCECTOMY LUMBAR  2006   POLYPECTOMY  12/19/2022   Procedure: POLYPECTOMY;  Surgeon: Jenel Lucks, MD;  Location: [facility] ENDOSCOPY;  Service: Gastroenterology;;   SPINAL CORD STIMULATOR INSERTION     blood clot removed from spinal cord   SPINE SURGERY     blood clot removed from spinal cord   XI ROBOTIC ASSISTED INGUINAL HERNIA REPAIR WITH MESH     x 3 surgeries    Social History   Socioeconomic History   Marital status: Married    Spouse name: Not on file   Number of children: 4   Years of education: 16   Highest education level: Not on file  Occupational History   Occupation: disabled/ respiratory therapist  Tobacco Use   Smoking status: Former    Current packs/day: 0.00    Average packs/day: 1 pack/day for 30.0 years (30.0 ttl pk-yrs)    Types: Cigarettes    Start date: 41    Quit date: 2007    Years since quitting: 18.1   Smokeless tobacco: Never  Vaping Use   Vaping status: Never Used  Substance and Sexual Activity   Alcohol use: Not Currently   Drug use: Not Currently   Sexual activity: Yes  Other Topics Concern   Not on file  Social History Narrative   ** Merged History Encounter **       Social Drivers of Health   Financial Resource Strain: Not on file  Food Insecurity: No Food Insecurity (06/08/2023)   Hunger Vital Sign    Worried About Running Out of Food in the Last Year: Never true    Ran Out of Food in the Last Year: Never true  Transportation Needs: No Transportation Needs (06/08/2023)   PRAPARE - Administrator, Civil Service (Medical): No    Lack of Transportation (Non-Medical): No  Physical Activity: Not on file   Stress: Not on file  Social Connections: Not on file    Family History  Problem Relation Age of Onset   COPD Mother    Anxiety disorder Mother    Depression Mother    Colon cancer Mother    Cancer Father        mets, origin unknown   Anxiety disorder Father    Depression Father    Coronary artery disease Father    CAD Brother    Leukemia Maternal Grandmother    Multiple sclerosis Daughter  Diabetes Neg Hx     Health Maintenance  Topic Date Due   Medicare Annual Wellness (AWV)  Never done   Zoster Vaccines- Shingrix (1 of 2) Never done   COVID-19 Vaccine (3 - 2024-25 season) 04/27/2023   HEMOGLOBIN A1C  09/27/2023   Hepatitis C Screening  03/26/2024 (Originally 09/13/1973)   OPHTHALMOLOGY EXAM  12/04/2023   Diabetic kidney evaluation - Urine ACR  03/26/2024   FOOT EXAM  05/20/2024   Diabetic kidney evaluation - eGFR measurement  08/07/2024   Colonoscopy  12/18/2032   DTaP/Tdap/Td (3 - Td or Tdap) 09/14/2033   Pneumonia Vaccine 29+ Years old  Completed   INFLUENZA VACCINE  Completed   HPV VACCINES  Aged Out     ----------------------------------------------------------------------------------------------------------------------------------------------------------------------------------------------------------------- Physical Exam BP (!) 159/82 (BP Location: Left Arm, Patient Position: Sitting, Cuff Size: Normal)   Pulse 84   Ht 5\' 8"  (1.727 m)   Wt 206 lb (93.4 kg)   SpO2 98%   BMI 31.32 kg/m   Physical Exam Constitutional:      Appearance: Normal appearance.  HENT:     Head: Normocephalic and atraumatic.  Eyes:     General: No scleral icterus. Cardiovascular:     Rate and Rhythm: Normal rate and regular rhythm.  Pulmonary:     Effort: Pulmonary effort is normal.     Breath sounds: Normal breath sounds.  Musculoskeletal:     Cervical back: Neck supple.  Neurological:     Mental Status: He is alert.  Psychiatric:        Mood and Affect: Mood normal.         Behavior: Behavior normal.     ------------------------------------------------------------------------------------------------------------------------------------------------------------------------------------------------------------------- Assessment and Plan  Major depression in partial remission (HCC) Some of her stressors related to the separation from his wife as well as financial issues.  He feels the bupropion is still working well for him.  Overall he is handling this pretty well.  He will let me know if having new or worsening symptoms  Injury of face He does have palpable nodule above the right eyebrow.  This is quite tender to touch.  This is fairly close to the supraorbital foramen.  CT maxillofacial ordered.  May need ENT consultation as well.   No orders of the defined types were placed in this encounter.   No follow-ups on file.    This visit occurred during the SARS-CoV-2 public health emergency.  Safety protocols were in place, including screening questions prior to the visit, additional usage of staff PPE, and extensive cleaning of exam room while observing appropriate contact time as indicated for disinfecting solutions.

## 2023-11-03 ENCOUNTER — Other Ambulatory Visit: Payer: Self-pay | Admitting: Family Medicine

## 2023-11-03 ENCOUNTER — Other Ambulatory Visit: Payer: Self-pay | Admitting: Cardiology

## 2023-11-03 DIAGNOSIS — G959 Disease of spinal cord, unspecified: Secondary | ICD-10-CM

## 2023-11-04 ENCOUNTER — Ambulatory Visit (INDEPENDENT_AMBULATORY_CARE_PROVIDER_SITE_OTHER): Payer: 59 | Admitting: Podiatry

## 2023-11-04 ENCOUNTER — Encounter: Payer: Self-pay | Admitting: Podiatry

## 2023-11-04 ENCOUNTER — Other Ambulatory Visit (HOSPITAL_BASED_OUTPATIENT_CLINIC_OR_DEPARTMENT_OTHER): Payer: Self-pay

## 2023-11-04 DIAGNOSIS — E1142 Type 2 diabetes mellitus with diabetic polyneuropathy: Secondary | ICD-10-CM | POA: Diagnosis not present

## 2023-11-04 DIAGNOSIS — L84 Corns and callosities: Secondary | ICD-10-CM | POA: Diagnosis not present

## 2023-11-04 MED ORDER — NYSTATIN 100000 UNIT/ML MT SUSP
5.0000 mL | Freq: Three times a day (TID) | OROMUCOSAL | 1 refills | Status: DC | PRN
  Filled 2023-11-04: qty 200, 14d supply, fill #0
  Filled 2023-12-05: qty 200, 14d supply, fill #1

## 2023-11-04 MED ORDER — TAMSULOSIN HCL 0.4 MG PO CAPS
0.4000 mg | ORAL_CAPSULE | Freq: Every day | ORAL | 1 refills | Status: AC
  Filled 2023-11-04: qty 90, 90d supply, fill #0
  Filled 2024-01-27 – 2024-02-24 (×2): qty 90, 90d supply, fill #1

## 2023-11-04 MED ORDER — METOCLOPRAMIDE HCL 10 MG PO TABS
10.0000 mg | ORAL_TABLET | Freq: Every day | ORAL | 1 refills | Status: AC | PRN
  Filled 2023-11-04: qty 90, 90d supply, fill #0

## 2023-11-04 NOTE — Progress Notes (Signed)
  Subjective:  Patient ID: Brandon [name], male    DOB: 1955-09-04,   MRN: [id number]  No chief complaint on file.   68 y.o. male presents for concern of thickened painful calluses on his feet. Requesting to have them trimmed today..  Relates burning and tingling in their feet. Patient is diabetic and last A1c was  Lab Results  Component Value Date   HGBA1C 6.7 (H) 03/27/2023   .   PCP:  Everrett Coombe, DO    . Denies any other pedal complaints. Denies n/v/f/c.   Past Medical History:  Diagnosis Date   Anxiety    Arthritis    Basal cell carcinoma (BCC) in situ of skin    left neck   Chronic back pain    has spinal cord stimulator and wears buprenorphine patch   Colon polyps    Coronary artery disease    S/p DES to mRCA in 12/21 // S/p DES to LCx x 2 in 12/2020 // Diff dz in small to mod Dx (not a good target for PCI); OM1 100 CTO; severe dz in small OM2 and PDA >> Med Rx   Depression    Diabetes mellitus without complication (HCC)    type 2   Fibromyalgia    Gallstones    GERD (gastroesophageal reflux disease)    Hepatitis B    HLD (hyperlipidemia)    Hypertension    Osteoarthritis    Pneumonia    PONV (postoperative nausea and vomiting)    SBO (small bowel obstruction) (HCC)    Sleep apnea    does not use CPAP    Objective:  Physical Exam: Vascular: DP/PT pulses 2/4 bilateral. CFT <3 seconds. Absent hair growth on digits. Edema noted to bilateral lower extremities. Xerosis noted bilaterally.  Skin. No lacerations or abrasions bilateral feet. Nails 1-5 bilateral  are normal in appearance. Sub first metatarsal and fifth metatarsal bilateral with hyperkeratotic lesions. Nearly ulcerative under left first metatarsal Musculoskeletal: MMT 5/5 bilateral lower extremities in DF, PF, Inversion and Eversion. Deceased ROM in DF of ankle joint.  Neurological: Sensation intact to light touch. Protective sensation diminished bilateral.    Assessment:   1. Pre-ulcerative corn or  callous   2. Type 2 diabetes mellitus with peripheral neuropathy (HCC)      Plan:  Patient was evaluated and treated and all questions answered. -Discussed and educated patient on diabetic foot care, especially with  regards to the vascular, neurological and musculoskeletal systems.  -Stressed the importance of good glycemic control and the detriment of not  controlling glucose levels in relation to the foot. -Discussed supportive shoes at all times and checking feet regularly.  -Mechanically debrided hyperkeratotic lesions x2 with chisel without incident.  -Will work on getting fitted for DM shoes.  -Answered all patient questions -Patient to return  in 3 months for at risk foot care -Patient advised to call the office if any problems or questions arise in the meantime.   Louann Sjogren, DPM

## 2023-11-05 ENCOUNTER — Other Ambulatory Visit (HOSPITAL_BASED_OUTPATIENT_CLINIC_OR_DEPARTMENT_OTHER): Payer: Self-pay

## 2023-11-05 ENCOUNTER — Other Ambulatory Visit (HOSPITAL_COMMUNITY): Payer: Self-pay

## 2023-11-05 NOTE — Telephone Encounter (Signed)
 Pt's pharmacy is requesting a refill on medication tizanidine. Would Dr. Mayford Knife like to refill this non cardiac medication? Please address

## 2023-11-06 ENCOUNTER — Other Ambulatory Visit (HOSPITAL_BASED_OUTPATIENT_CLINIC_OR_DEPARTMENT_OTHER): Payer: Self-pay

## 2023-11-06 ENCOUNTER — Other Ambulatory Visit: Payer: Self-pay

## 2023-11-06 MED ORDER — TIZANIDINE HCL 4 MG PO TABS
4.0000 mg | ORAL_TABLET | Freq: Three times a day (TID) | ORAL | 3 refills | Status: AC | PRN
  Filled 2023-11-06: qty 90, 20d supply, fill #0

## 2023-11-07 ENCOUNTER — Other Ambulatory Visit: Payer: Self-pay | Admitting: Podiatry

## 2023-11-07 ENCOUNTER — Other Ambulatory Visit (HOSPITAL_BASED_OUTPATIENT_CLINIC_OR_DEPARTMENT_OTHER): Payer: Self-pay

## 2023-11-07 ENCOUNTER — Telehealth: Payer: Self-pay

## 2023-11-07 DIAGNOSIS — G4734 Idiopathic sleep related nonobstructive alveolar hypoventilation: Secondary | ICD-10-CM | POA: Diagnosis not present

## 2023-11-07 MED ORDER — AMMONIUM LACTATE 12 % EX CREA
1.0000 | TOPICAL_CREAM | CUTANEOUS | 0 refills | Status: AC | PRN
  Filled 2023-11-07: qty 385, 84d supply, fill #0

## 2023-11-07 NOTE — Telephone Encounter (Signed)
 Patient called and left a message -since the callus trim om Monday, he feels like he is walking on rocks - please advise if he should be using any kind of cream on his feet.

## 2023-11-10 ENCOUNTER — Other Ambulatory Visit (HOSPITAL_BASED_OUTPATIENT_CLINIC_OR_DEPARTMENT_OTHER): Payer: Self-pay

## 2023-11-10 NOTE — Telephone Encounter (Signed)
 Requesting rx rf of magnesium oxide 400mg  Last written by historical provider Last OV 10/30/2023 Upcoming appt none

## 2023-11-11 ENCOUNTER — Other Ambulatory Visit (HOSPITAL_BASED_OUTPATIENT_CLINIC_OR_DEPARTMENT_OTHER): Payer: Self-pay

## 2023-11-11 ENCOUNTER — Other Ambulatory Visit: Payer: Self-pay

## 2023-11-11 MED ORDER — MAGNESIUM OXIDE 400 MG PO TABS
800.0000 mg | ORAL_TABLET | Freq: Every day | ORAL | 3 refills | Status: AC
  Filled 2023-11-11: qty 120, 60d supply, fill #0
  Filled 2024-07-15: qty 120, 60d supply, fill #1

## 2023-11-12 ENCOUNTER — Other Ambulatory Visit (HOSPITAL_BASED_OUTPATIENT_CLINIC_OR_DEPARTMENT_OTHER): Payer: Self-pay

## 2023-11-13 ENCOUNTER — Other Ambulatory Visit (HOSPITAL_BASED_OUTPATIENT_CLINIC_OR_DEPARTMENT_OTHER): Payer: Self-pay

## 2023-11-17 ENCOUNTER — Other Ambulatory Visit (HOSPITAL_BASED_OUTPATIENT_CLINIC_OR_DEPARTMENT_OTHER): Payer: Self-pay

## 2023-11-17 ENCOUNTER — Ambulatory Visit: Admitting: Cardiology

## 2023-11-18 ENCOUNTER — Other Ambulatory Visit (HOSPITAL_BASED_OUTPATIENT_CLINIC_OR_DEPARTMENT_OTHER): Payer: Self-pay

## 2023-11-18 ENCOUNTER — Ambulatory Visit: Payer: 59 | Admitting: Internal Medicine

## 2023-11-18 NOTE — Progress Notes (Deleted)
 Patient ID: Brandon [name], male   DOB: [birth year], 68 y.o.   MRN: [id number]  HPI: Brandon [name] is a 68 y.o.-year-old male, returning for follow-up for DM2, dx in 2004, insulin-dependent since 2012, controlled, with complications (CAD, gastroparesis).  He previously saw Dr. Everardo All.  Last visit with me 6 months ago.  Interim history: No increased urination, blurry vision, nausea, chest pain. He a partial SBO 06/2022 (the 3rd episode). He was taken off Ozempic.  He then started [facility] per PCP recommendation.  He was tolerating this well.  Since last visit, however, he was admitted 06/08/2023 for another episode of SBO. He had a fall on 20 2025 with facial laceration.  Insulin pump: - T: slim x2  CGM: - Dexcom G6  Insulin: - Humalog  Supplier: [employer]  Reviewed HbA1c: Lab Results  Component Value Date   HGBA1C 6.7 (H) 03/27/2023   HGBA1C 6.6 (H) 09/26/2022   HGBA1C 6.1 (A) 05/13/2022   HGBA1C 6.2 (A) 01/07/2022   HGBA1C 6.3 (H) 11/01/2021   HGBA1C 6.4 (A) 09/04/2021   HGBA1C 6.5 (H) 08/28/2021   HGBA1C 6.1 (A) 05/29/2021   HGBA1C 6.3 (A) 03/22/2021   HGBA1C 6.8 (A) 01/16/2021   Pt is on a regimen of: - Jardiance 25 mg before breakfast - Ozempic 2 mg weekly - lost 65 lbs in last 1.5 years >> Mounjaro 12.5 >> 15 mg weekly (increased by PCP 1 mo ago)  Insulin pump settings: - Basal rates: 12 am: 0.5 units/h - Insulin to carb ratio: 12 am: 1:5 >> 1:15 >> 1:20 - Target: 12 am: 1:120 - Correction factor (insulin sensitivity factor):  12 am: 100 - Active insulin time: 5h - Changes infusion site: q3 days  Total daily dose from basal insulin: 16 units (59%) >> 61% Total daily dose from bolus insulin: 12 units (41%) >> 39% TDD: 30-50 units Average daily boluses: 8, of which only 1 manual!  He checks his sugars with a Dexcom CGM, more than 4 times a day:  Previously:  Previously:   Lowest sugar was 48 >> 110; he has hypoglycemia awareness at 70.  Highest sugar  was 359 >> ... 240s.  Glucometer:Freestyle  - no CKD, last BUN/creatinine:  Lab Results  Component Value Date   BUN 9 08/08/2023   BUN 6 (L) 06/17/2023   CREATININE 0.78 08/08/2023   CREATININE 0.72 (L) 06/17/2023   Lab Results  Component Value Date   MICRALBCREAT 55 (H) 03/27/2023   MICRALBCREAT <30 03/26/2022  Not on ACE inhibitor/ARB.  -+ Dyslipidemia; last set of lipids: Lab Results  Component Value Date   CHOL 129 03/27/2023   HDL 36 (L) 03/27/2023   LDLCALC 52 03/27/2023   TRIG 259 (H) 03/27/2023   CHOLHDL 3.3 03/26/2022  On Lipitor 80 mg daily.  - last eye exam was on 12/04/2022: No DR.  - no numbness and tingling in his feet.  Last foot exam in 11/04/2023 by Dr. Ralene Cork: Preulcerative calluses.  On ASA 81.  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 He had an Inspire device inserted to help with OSA. He likes it.  However, he had oxygen desaturation at night and was started on oxygen.  Since this was drying his mouth and nose, he now also has a humidifier.  ROS: + see HPI  Past Medical History:  Diagnosis Date   Anxiety    Arthritis    Basal cell carcinoma (BCC) in situ of skin    left  neck   Chronic back pain    has spinal cord stimulator and wears buprenorphine patch   Colon polyps    Coronary artery disease    S/p DES to mRCA in 12/21 // S/p DES to LCx x 2 in 12/2020 // Diff dz in small to mod Dx (not a good target for PCI); OM1 100 CTO; severe dz in small OM2 and PDA >> Med Rx   Depression    Diabetes mellitus without complication (HCC)    type 2   Fibromyalgia    Gallstones    GERD (gastroesophageal reflux disease)    Hepatitis B    HLD (hyperlipidemia)    Hypertension    Osteoarthritis    Pneumonia    PONV (postoperative nausea and vomiting)    SBO (small bowel obstruction) (HCC)    Sleep apnea    does not use CPAP   Past Surgical History:  Procedure Laterality Date   CARDIAC CATHETERIZATION   01/17/2021   CATARACT EXTRACTION W/ INTRAOCULAR LENS  IMPLANT, BILATERAL     CHOLECYSTECTOMY     CHOLECYSTECTOMY, LAPAROSCOPIC     COLONOSCOPY WITH PROPOFOL N/A 12/19/2022   Procedure: COLONOSCOPY WITH PROPOFOL;  Surgeon: Jenel Lucks, MD;  Location: [facility] ENDOSCOPY;  Service: Gastroenterology;  Laterality: N/A;   CORONARY STENT INTERVENTION N/A 08/15/2020   Procedure: CORONARY STENT INTERVENTION;  Surgeon: Corky Crafts, MD;  Location: [facility] INVASIVE CV LAB;  Service: Cardiovascular;  Laterality: N/A;   CORONARY STENT INTERVENTION N/A 01/17/2021   Procedure: CORONARY STENT INTERVENTION;  Surgeon: Kathleene Hazel, MD;  Location: [facility] INVASIVE CV LAB;  Service: Cardiovascular;  Laterality: N/A;   CORONARY ULTRASOUND/IVUS N/A 08/15/2020   Procedure: Intravascular Ultrasound/IVUS;  Surgeon: Corky Crafts, MD;  Location: [facility] INVASIVE CV LAB;  Service: Cardiovascular;  Laterality: N/A;   DRUG INDUCED ENDOSCOPY N/A 05/23/2022   Procedure: DRUG INDUCED SLEEP ENDOSCOPY;  Surgeon: Osborn Coho, MD;  Location: [facility] OR;  Service: ENT;  Laterality: N/A;   HERNIA REPAIR     IMPLANTATION OF HYPOGLOSSAL NERVE STIMULATOR Right 07/30/2022   Procedure: IMPLANTATION OF HYPOGLOSSAL NERVE STIMULATOR;  Surgeon: Osborn Coho, MD;  Location: [facility];  Service: ENT;  Laterality: Right;   LEFT HEART CATH AND CORONARY ANGIOGRAPHY N/A 08/15/2020   Procedure: LEFT HEART CATH AND CORONARY ANGIOGRAPHY;  Surgeon: Corky Crafts, MD;  Location: [facility] INVASIVE CV LAB;  Service: Cardiovascular;  Laterality: N/A;   LEFT HEART CATH AND CORONARY ANGIOGRAPHY N/A 01/17/2021   Procedure: LEFT HEART CATH AND CORONARY ANGIOGRAPHY;  Surgeon: Kathleene Hazel, MD;  Location: [facility] INVASIVE CV LAB;  Service: Cardiovascular;  Laterality: N/A;   LUMBAR FUSION     L3-5 with rods   MICRODISCECTOMY LUMBAR  2006   POLYPECTOMY  12/19/2022   Procedure: POLYPECTOMY;  Surgeon: Jenel Lucks,  MD;  Location: [facility] ENDOSCOPY;  Service: Gastroenterology;;   SPINAL CORD STIMULATOR INSERTION     blood clot removed from spinal cord   SPINE SURGERY     blood clot removed from spinal cord   XI ROBOTIC ASSISTED INGUINAL HERNIA REPAIR WITH MESH     x 3 surgeries   Social History   Socioeconomic History   Marital status: Married    Spouse name: Not on file   Number of children: 4   Years of education: 16   Highest education level: Not on file  Occupational History   Occupation: disabled/ respiratory therapist  Tobacco Use   Smoking status: Former  Current packs/day: 0.00    Average packs/day: 1 pack/day for 30.0 years (30.0 ttl pk-yrs)    Types: Cigarettes    Start date: 87    Quit date: 2007    Years since quitting: 18.2   Smokeless tobacco: Never  Vaping Use   Vaping status: Never Used  Substance and Sexual Activity   Alcohol use: Not Currently   Drug use: Not Currently   Sexual activity: Yes  Other Topics Concern   Not on file  Social History Narrative   ** Merged History Encounter **       Social Drivers of Health   Financial Resource Strain: Not on file  Food Insecurity: No Food Insecurity (06/08/2023)   Hunger Vital Sign    Worried About Running Out of Food in the Last Year: Never true    Ran Out of Food in the Last Year: Never true  Transportation Needs: No Transportation Needs (06/08/2023)   PRAPARE - Administrator, Civil Service (Medical): No    Lack of Transportation (Non-Medical): No  Physical Activity: Not on file  Stress: Not on file  Social Connections: Not on file  Intimate Partner Violence: Not At Risk (06/08/2023)   Humiliation, Afraid, Rape, and Kick questionnaire    Fear of Current or Ex-Partner: No    Emotionally Abused: No    Physically Abused: No    Sexually Abused: No   Current Outpatient Medications on File Prior to Visit  Medication Sig Dispense Refill   acetaminophen (TYLENOL) 500 MG tablet Take 1,000 mg by mouth  See admin instructions. Take 2 tablets (1000 mg) by mouth scheduled in the morning & may take 2 tablets (1000 mg) by mouth every 6 hours if needed for pain.     albuterol (VENTOLIN HFA) 108 (90 Base) MCG/ACT inhaler Inhale 2 puffs into the lungs as needed for wheezing or shortness of breath (When sick). (Patient not taking: Reported on 11/17/2023)     AMBULATORY NON FORMULARY MEDICATION Please provide home oxygen for nocturnal use.  Apply 2-4L of O2 via nasal cannula at bedtime.  Dx: Nocturnal Oxygen Desaturation G47.34 1 Device 0   AMBULATORY NON FORMULARY MEDICATION Please provide humidifier for home oxygen due to nasal irritation with use. 1 Units 0   ammonium lactate (AMLACTIN) 12 % cream Apply to the affected area(s) topically as needed for dry skin. 385 g 0   ARIPiprazole (ABILIFY) 10 MG tablet Take 1 tablet (10 mg total) by mouth every morning. 90 tablet 1   atorvastatin (LIPITOR) 80 MG tablet Take 1 tablet (80 mg total) by mouth daily. 90 tablet 3   b complex vitamins capsule Take 1 capsule by mouth in the morning.     buprenorphine (BUTRANS) 10 MCG/HR PTWK Place 1 patch onto the skin once a week. 4 patch 3   buPROPion (WELLBUTRIN XL) 150 MG 24 hr tablet Take 1 tablet (150 mg total) by mouth daily in addition to 300mg  tablet. 90 tablet 1   buPROPion (WELLBUTRIN XL) 300 MG 24 hr tablet Take 1 tablet (300 mg total) by mouth daily. 90 tablet 0   clopidogrel (PLAVIX) 75 MG tablet Take 1 tablet (75 mg total) by mouth daily with breakfast. 90 tablet 3   Continuous Glucose Sensor (DEXCOM G6 SENSOR) MISC Use as directed. Change sensor every 10 days 9 each 3   Continuous Glucose Sensor (DEXCOM G6 SENSOR) MISC Use as directed. Change sensor every 10 days 9 each 3   empagliflozin (JARDIANCE) 25  MG TABS tablet Take 1 tablet (25 mg total) by mouth daily. 90 tablet 3   GVOKE HYPOPEN 1-PACK 1 MG/0.2ML SOAJ Use as needed for hypoglycemia 0.2 mL PRN   insulin lispro (HUMALOG) 100 UNIT/ML injection For use in  pump, total of 60 units per day 60 mL 3   Iron, Ferrous Sulfate, 325 (65 Fe) MG TABS Take 1 tablet (325 mg) by mouth daily. 100 tablet 1   magic mouthwash (nystatin, lidocaine, diphenhydrAMINE, alum & mag hydroxide) suspension Swish and spit 5 mLs 3 (three) times daily as needed for mouth pain. 200 mL 1   magnesium oxide (MAG-OX) 400 MG tablet Take 2 tablets (800 mg total) by mouth daily. 90 tablet 3   metoCLOPramide (REGLAN) 10 MG tablet Take 1 tablet (10 mg total) by mouth daily as needed for nausea or vomiting (upset stomach). 90 tablet 1   Multiple Vitamin (MULTI-VITAMIN) tablet Take 1 tablet by mouth daily. 130 tablet 1   nitroGLYCERIN (NITROSTAT) 0.4 MG SL tablet Place 0.4 mg under the tongue every 5 (five) minutes x 3 doses as needed for chest pain.     ondansetron (ZOFRAN-ODT) 4 MG disintegrating tablet Take 1 tablet (4 mg total) by mouth every 8 (eight) hours as needed for nausea or vomiting. 20 tablet 0   pantoprazole (PROTONIX) 40 MG tablet Take 1 tablet (40 mg total) by mouth daily. 90 tablet 0   polyethylene glycol powder (MIRALAX) 17 GM/SCOOP powder Mix 17 grams (1 capful) in 8 oz of liquid and drink once a day 238 g 0   tamsulosin (FLOMAX) 0.4 MG CAPS capsule Take 1 capsule (0.4 mg total) by mouth daily. 90 capsule 1   testosterone (ANDROGEL) 50 MG/5GM (1%) GEL Place 5 g (1 packet) onto the shoulders and upper arms daily as directed. 150 g 3   tiZANidine (ZANAFLEX) 4 MG tablet Take 1-1&1/2 tablets (4-6 mg total) by mouth every 8 (eight) hours as needed for muscle spasms. 90 tablet 3   No current facility-administered medications on file prior to visit.   Allergies  Allergen Reactions   Kiwi Extract Swelling    Mouth swelling.    Other Swelling and Other (See Comments)    EGGPLANT. Mouth swelling.   Penicillins Rash    Reaction: unknown   Ancef [Cefazolin] Rash   Latex Rash   Levaquin [Levofloxacin] Rash   Peach [Prunus Persica] Other (See Comments)    Burns mouth    Family History  Problem Relation Age of Onset   COPD Mother    Anxiety disorder Mother    Depression Mother    Colon cancer Mother    Cancer Father        mets, origin unknown   Anxiety disorder Father    Depression Father    Coronary artery disease Father    CAD Brother    Leukemia Maternal Grandmother    Multiple sclerosis Daughter    Diabetes Neg Hx    PE: There were no vitals taken for this visit. Wt Readings from Last 10 Encounters:  10/30/23 206 lb (93.4 kg)  09/17/23 210 lb (95.3 kg)  09/15/23 204 lb (92.5 kg)  08/14/23 210 lb (95.3 kg)  08/08/23 213 lb 13.5 oz (97 kg)  08/08/23 215 lb 4 oz (97.6 kg)  07/29/23 214 lb (97.1 kg)  06/17/23 200 lb 0.6 oz (90.7 kg)  06/08/23 194 lb (88 kg)  05/21/23 203 lb 3.2 oz (92.2 kg)   Constitutional: overweight, in NAD Eyes:  EOMI,  no exophthalmos ENT: no neck masses, no cervical lymphadenopathy Cardiovascular: RRR, No MRG Respiratory: CTA B Musculoskeletal: no deformities Skin:no rashes Neurological: no tremor with outstretched hands  ASSESSMENT: 1. DM2, insulin-dependent, controlled, with complications - CAD - s/p 3 stents - Ao atherosclerosis - per CT neck 08/08/2023 - Gastroparesis  2. HL  PLAN:  1. Patient with longstanding, fairly well-controlled type 2 diabetes, on SGLT2 inhibitor, GLP-1/GIP receptor agonist and insulin in the t:slim X2 insulin pump with a Dexcom CGM.  At last visit, HbA1c was slightly higher, at 6.7%.  -In the past, he was not bolusing for meals, only relying on auto boluses for his meals.  However, at last visit, he was doing some boluses before meals, but still missing many of these.  Sugars are fluctuating around the upper limit of the target range with higher values after breakfast and lunch but particularly after dinner.  When she was bolusing, he was entering a large amount of carbs when the sugars were already high after meals and I explained that this was too late and he can cause low  blood sugars.  I advised him to try his best to inject Humalog 15 minutes before the meal but if he has to bolus after a meal, to only enter 50% of the carbs at that time.  I did not suggest a change in regimen pending increased compliance with insulin boluses. -He was previously on Ozempic and had an SBO.  He was taken off Ozempic at that time but he started [facility] afterwards which she tolerates well.  We continued the 12.5 mg dose at last visit CGM interpretation: -At today's visit, we reviewed his CGM downloads: It appears that *** of values are in target range (goal >70%), while *** are higher than 180 (goal <25%), and *** are lower than 70 (goal <4%).  The calculated average blood sugar is ***.  The projected HbA1c for the next 3 months (GMI) is ***. -Reviewing the CGM trends, ***  -Reviewing the CGM trends, sugars appear to be improved from before, now almost entirely fluctuating within the target range with only mild occasional hyperglycemic exceptions after meals.  - I suggested to:  Patient Instructions  Please use the following pump settings: - Basal rates: 12 am: 0.5 units/h - Insulin to carb ratio: 12 am: 1:20 - Target: 12 am: 1:120 - Correction factor (insulin sensitivity factor):  12 am: 100 - Active insulin time: 5h - Changes infusion site: q3 days   Please do the following approximately 15 minutes before every meal: - Enter carbs (C) - Enter sugars (S) - Start insulin bolus (I)   If you bolus insulin after a meal, try to enter just 50% of the carbs + correct the high blood sugar.   Please continue: - Jardiance 25 mg before breakfast - Mounjaro 15 mg weekly    Please return in 6 months.  - we checked his HbA1c: 7%  - advised to check sugars at different times of the day - 4x a day, rotating check times - advised for yearly eye exams >> he is UTD - return to clinic in 6  months  2. HL -Latest lipid panel was reviewed from 03/2023: LDL at goal, HDL slightly  low, triglycerides elevated, Lab Results  Component Value Date   CHOL 129 03/27/2023   HDL 36 (L) 03/27/2023   LDLCALC 52 03/27/2023   TRIG 259 (H) 03/27/2023   CHOLHDL 3.3 03/26/2022  -He is on Lipitor 80 mg daily without  side effects  Carlus Pavlov, MD PhD [facility]

## 2023-11-20 ENCOUNTER — Other Ambulatory Visit (HOSPITAL_BASED_OUTPATIENT_CLINIC_OR_DEPARTMENT_OTHER): Payer: Self-pay

## 2023-11-21 ENCOUNTER — Other Ambulatory Visit: Payer: Self-pay | Admitting: Family Medicine

## 2023-11-21 ENCOUNTER — Other Ambulatory Visit (HOSPITAL_BASED_OUTPATIENT_CLINIC_OR_DEPARTMENT_OTHER): Payer: Self-pay

## 2023-11-21 ENCOUNTER — Other Ambulatory Visit (HOSPITAL_COMMUNITY): Payer: Self-pay

## 2023-11-21 ENCOUNTER — Telehealth: Payer: Self-pay | Admitting: Pharmacy Technician

## 2023-11-21 NOTE — Telephone Encounter (Signed)
 Pharmacy Patient Advocate Encounter   Received notification from Onbase that prior authorization for Testosterone 50 MG/5GM(1%) gel is required/requested.   Insurance verification completed.   The patient is insured through [facility] .   Per test claim: PA required; PA submitted to above mentioned insurance via CoverMyMeds Key/confirmation #/EOC [id number] Status is pending

## 2023-11-21 NOTE — Telephone Encounter (Signed)
 Pharmacy Patient Advocate Encounter  Received notification from [facility] that Prior Authorization for Testosterone 50 MG/5GM(1%) gel has been APPROVED from 11/21/2023 to 11/20/2024. Ran test claim, Copay is $5.00. This test claim was processed through [facility]- copay amounts may vary at other pharmacies due to pharmacy/plan contracts, or as the patient moves through the different stages of their insurance plan.   PA #/Case ID/Reference #: [id number]

## 2023-11-24 ENCOUNTER — Ambulatory Visit: Admitting: Cardiology

## 2023-11-24 ENCOUNTER — Other Ambulatory Visit (HOSPITAL_BASED_OUTPATIENT_CLINIC_OR_DEPARTMENT_OTHER): Payer: Self-pay

## 2023-11-24 MED ORDER — TESTOSTERONE 50 MG/5GM (1%) TD GEL
5.0000 g | Freq: Every day | TRANSDERMAL | 3 refills | Status: AC
  Filled 2023-11-24: qty 150, 30d supply, fill #0
  Filled 2024-01-10: qty 150, 30d supply, fill #1

## 2023-11-25 ENCOUNTER — Encounter: Payer: 59 | Admitting: Student in an Organized Health Care Education/Training Program

## 2023-11-25 ENCOUNTER — Other Ambulatory Visit (HOSPITAL_BASED_OUTPATIENT_CLINIC_OR_DEPARTMENT_OTHER): Payer: Self-pay

## 2023-11-28 ENCOUNTER — Encounter: Payer: Self-pay | Admitting: Family Medicine

## 2023-12-05 ENCOUNTER — Other Ambulatory Visit (HOSPITAL_BASED_OUTPATIENT_CLINIC_OR_DEPARTMENT_OTHER): Payer: Self-pay

## 2023-12-08 DIAGNOSIS — G4734 Idiopathic sleep related nonobstructive alveolar hypoventilation: Secondary | ICD-10-CM | POA: Diagnosis not present

## 2023-12-10 ENCOUNTER — Other Ambulatory Visit

## 2023-12-10 ENCOUNTER — Other Ambulatory Visit (HOSPITAL_BASED_OUTPATIENT_CLINIC_OR_DEPARTMENT_OTHER): Payer: Self-pay

## 2023-12-13 ENCOUNTER — Telehealth: Payer: Self-pay

## 2023-12-13 ENCOUNTER — Other Ambulatory Visit (HOSPITAL_BASED_OUTPATIENT_CLINIC_OR_DEPARTMENT_OTHER): Payer: Self-pay

## 2023-12-13 ENCOUNTER — Ambulatory Visit
Admission: EM | Admit: 2023-12-13 | Discharge: 2023-12-13 | Disposition: A | Discharge status: Home / Self Care | Attending: Family Medicine | Admitting: Family Medicine

## 2023-12-13 DIAGNOSIS — L089 Local infection of the skin and subcutaneous tissue, unspecified: Secondary | ICD-10-CM

## 2023-12-13 DIAGNOSIS — W57XXXA Bitten or stung by nonvenomous insect and other nonvenomous arthropods, initial encounter: Secondary | ICD-10-CM | POA: Diagnosis not present

## 2023-12-13 DIAGNOSIS — S90562A Insect bite (nonvenomous), left ankle, initial encounter: Secondary | ICD-10-CM

## 2023-12-13 MED ORDER — DOXYCYCLINE HYCLATE 100 MG PO CAPS
100.0000 mg | ORAL_CAPSULE | Freq: Two times a day (BID) | ORAL | 0 refills | Status: DC
  Filled 2023-12-13: qty 14, 7d supply, fill #0

## 2023-12-13 MED ORDER — DOXYCYCLINE HYCLATE 100 MG PO CAPS: 100.0000 mg | ORAL_CAPSULE | Freq: Two times a day (BID) | ORAL | 0 refills | Status: DC

## 2023-12-13 NOTE — ED Triage Notes (Signed)
 Pt c/o left ankle redness,itching and pain since Wednesday.No home intervention.

## 2023-12-13 NOTE — ED Provider Notes (Signed)
 [facility]    CSN: [id number] Arrival date & time: 12/13/23  0808      History   Chief Complaint No chief complaint on file.   HPI Brandon [name] is a 68 y.o. male presents for an insect bite.  Patient reports he was bitten by an insect on his medial left ankle 3 days ago.  Reports has been red warm swollen and painful with some bloody drainage.  No fevers or chills.  Denies history of MRSA or cellulitis.  He has not used any OTC treatments for his symptoms.  No other concerns at this time.  HPI  Past Medical History:  Diagnosis Date   Anxiety    Arthritis    Basal cell carcinoma (BCC) in situ of skin    left neck   Chronic back pain    has spinal cord stimulator and wears buprenorphine  patch   Colon polyps    Coronary artery disease    S/p DES to mRCA in 12/21 // S/p DES to LCx x 2 in 12/2020 // Diff dz in small to mod Dx (not a good target for PCI); OM1 100 CTO; severe dz in small OM2 and PDA >> Med Rx   Depression    Diabetes mellitus without complication (HCC)    type 2   Fibromyalgia    Gallstones    GERD (gastroesophageal reflux disease)    Hepatitis B    HLD (hyperlipidemia)    Hypertension    Osteoarthritis    Pneumonia    PONV (postoperative nausea and vomiting)    SBO (small bowel obstruction) (HCC)    Sleep apnea    does not use CPAP    Patient Active Problem List   Diagnosis Date Noted   Injury of face 10/30/2023   Fall 09/17/2023   Right wrist pain 09/17/2023   Acute pain of right knee 09/17/2023   Right facial pain 09/17/2023   Laceration of right eyebrow 09/17/2023   Sore throat 08/08/2023   Pre-ulcerative corn or callous 07/29/2023   Morbid obesity (HCC) 07/29/2023   Hypokalemia 06/08/2023   Anxiety and depression 06/08/2023   Chronic back pain 06/08/2023   BPH (benign prostatic hyperplasia) 06/08/2023   GERD (gastroesophageal reflux disease) 06/08/2023   History of colonic polyps 12/19/2022   Family history of colorectal  cancer 12/19/2022   Nocturnal oxygen  desaturation 11/06/2022   Ileus (HCC) 07/12/2022   Radicular pain in right arm 05/02/2022   Neuroforaminal stenosis of cervical spine 05/02/2022   Well adult exam 03/26/2022   Partial small bowel obstruction (HCC) 01/09/2022   Small bowel obstruction (HCC) 12/30/2021   AMS (altered mental status) 11/01/2021   Diabetes mellitus (HCC) 11/01/2021   Community acquired pneumonia 10/31/2021   Coughing 10/29/2021   Pain management contract signed 10/11/2021   History of lumbar fusion (L4/5) 10/02/2021   Chronic pain syndrome 06/01/2021   Obstructive sleep apnea 05/28/2021   Metallic taste 05/21/2021   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   Acute pain of left shoulder 04/03/2021   Unstable angina (HCC)    Insomnia 09/03/2020   Status post coronary artery stent placement    Mixed hyperlipidemia    Coronary artery disease    Chest pain 08/11/2020   Lumbar facet arthropathy 06/22/2020   Major depression in partial remission (HCC) 06/04/2020   Hyperlipidemia associated with type 2 diabetes mellitus (HCC) 04/14/2020   Primary hypertension 04/07/2020   Diabetes mellitus type 2, uncomplicated, on long term insulin  pump (HCC) 04/07/2020  Spinal cord stimulator status 05/19/2017   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   Erectile dysfunction 10/09/2015    Past Surgical History:  Procedure Laterality Date   CARDIAC CATHETERIZATION  01/17/2021   CATARACT EXTRACTION W/ INTRAOCULAR LENS  IMPLANT, BILATERAL     CHOLECYSTECTOMY     CHOLECYSTECTOMY, LAPAROSCOPIC     COLONOSCOPY WITH PROPOFOL  N/A 12/19/2022   Procedure: COLONOSCOPY WITH PROPOFOL ;  Surgeon: Elois Hair, MD;  Location: [facility] ENDOSCOPY;  Service: Gastroenterology;  Laterality: N/A;   CORONARY STENT INTERVENTION N/A 08/15/2020   Procedure: CORONARY STENT INTERVENTION;  Surgeon: Lucendia Rusk, MD;  Location: [facility] INVASIVE CV LAB;  Service: Cardiovascular;  Laterality: N/A;   CORONARY STENT  INTERVENTION N/A 01/17/2021   Procedure: CORONARY STENT INTERVENTION;  Surgeon: Odie Benne, MD;  Location: [facility] INVASIVE CV LAB;  Service: Cardiovascular;  Laterality: N/A;   CORONARY ULTRASOUND/IVUS N/A 08/15/2020   Procedure: Intravascular Ultrasound/IVUS;  Surgeon: Lucendia Rusk, MD;  Location: [facility] INVASIVE CV LAB;  Service: Cardiovascular;  Laterality: N/A;   DRUG INDUCED ENDOSCOPY N/A 05/23/2022   Procedure: DRUG INDUCED SLEEP ENDOSCOPY;  Surgeon: Ammon Bales, MD;  Location: [facility] OR;  Service: ENT;  Laterality: N/A;   HERNIA REPAIR     IMPLANTATION OF HYPOGLOSSAL NERVE STIMULATOR Right 07/30/2022   Procedure: IMPLANTATION OF HYPOGLOSSAL NERVE STIMULATOR;  Surgeon: Ammon Bales, MD;  Location: [facility];  Service: ENT;  Laterality: Right;   LEFT HEART CATH AND CORONARY ANGIOGRAPHY N/A 08/15/2020   Procedure: LEFT HEART CATH AND CORONARY ANGIOGRAPHY;  Surgeon: Lucendia Rusk, MD;  Location: [facility] INVASIVE CV LAB;  Service: Cardiovascular;  Laterality: N/A;   LEFT HEART CATH AND CORONARY ANGIOGRAPHY N/A 01/17/2021   Procedure: LEFT HEART CATH AND CORONARY ANGIOGRAPHY;  Surgeon: Odie Benne, MD;  Location: [facility] INVASIVE CV LAB;  Service: Cardiovascular;  Laterality: N/A;   LUMBAR FUSION     L3-5 with rods   MICRODISCECTOMY LUMBAR  2006   POLYPECTOMY  12/19/2022   Procedure: POLYPECTOMY;  Surgeon: Elois Hair, MD;  Location: [facility] ENDOSCOPY;  Service: Gastroenterology;;   SPINAL CORD STIMULATOR INSERTION     blood clot removed from spinal cord   SPINE SURGERY     blood clot removed from spinal cord   XI ROBOTIC ASSISTED INGUINAL HERNIA REPAIR WITH MESH     x 3 surgeries       Home Medications    Prior to Admission medications   Medication Sig Start Date End Date Taking? Authorizing Provider  doxycycline  (VIBRAMYCIN ) 100 MG capsule Take 1 capsule (100 mg total) by mouth 2 (two) times daily for 7 days. 12/13/23 12/20/23 Yes Kaydie Petsch,  Jodi R, NP  acetaminophen  (TYLENOL ) 500 MG tablet Take 1,000 mg by mouth See admin instructions. Take 2 tablets (1000 mg) by mouth scheduled in the morning & may take 2 tablets (1000 mg) by mouth every 6 hours if needed for pain.    [provider]  albuterol  (VENTOLIN  HFA) 108 (90 Base) MCG/ACT inhaler Inhale 2 puffs into the lungs as needed for wheezing or shortness of breath (When sick). Patient not taking: Reported on 11/17/2023 10/29/21   [provider]  AMBULATORY NON FORMULARY MEDICATION Please provide home oxygen  for nocturnal use.  Apply 2-4L of O2 via nasal cannula at bedtime.  Dx: Nocturnal Oxygen  Desaturation G47.34 11/06/22   Adela Holter, DO  AMBULATORY NON FORMULARY MEDICATION Please provide humidifier for home oxygen  due to nasal irritation with use. 11/13/22   Adela Holter, DO  ammonium lactate  (AMLACTIN) 12 % cream Apply to the affected area(s) topically as needed for dry skin. 11/07/23   Sikora, Rebecca, DPM  ARIPiprazole  (ABILIFY ) 10 MG tablet Take 1 tablet (10 mg total) by mouth every morning. 05/23/23   Adela Holter, DO  atorvastatin  (LIPITOR ) 80 MG tablet Take 1 tablet (80 mg total) by mouth daily. 08/15/23   Marlyse Single T, PA-C  b complex vitamins capsule Take 1 capsule by mouth in the morning.    [provider]  buPROPion  (WELLBUTRIN  XL) 150 MG 24 hr tablet Take 1 tablet (150 mg total) by mouth daily in addition to 300mg  tablet. 03/25/23   Adela Holter, DO  buPROPion  (WELLBUTRIN  XL) 300 MG 24 hr tablet Take 1 tablet (300 mg total) by mouth daily. 08/15/23   Adela Holter, DO  clopidogrel  (PLAVIX ) 75 MG tablet Take 1 tablet (75 mg total) by mouth daily with breakfast. 12/20/22   Elois Hair, MD  Continuous Glucose Sensor (DEXCOM G6 SENSOR) MISC Use as directed. Change sensor every 10 days 03/11/23   Adela Holter, DO  Continuous Glucose Sensor (DEXCOM G6 SENSOR) MISC Use as directed. Change sensor every 10 days 05/29/23   Adela Holter, DO   empagliflozin  (JARDIANCE ) 25 MG TABS tablet Take 1 tablet (25 mg total) by mouth daily. 10/27/23   Adela Holter, DO  GVOKE HYPOPEN  1-PACK 1 MG/0.2ML SOAJ Use as needed for hypoglycemia 05/13/22   Emilie Harden, MD  insulin  lispro (HUMALOG ) 100 UNIT/ML injection For use in pump, total of 60 units per day 08/07/23   Emilie Harden, MD  Iron , Ferrous Sulfate , 325 (65 Fe) MG TABS Take 1 tablet (325 mg) by mouth daily. 05/21/23   Adela Holter, DO  magic mouthwash (nystatin , lidocaine , diphenhydrAMINE, alum & mag hydroxide) suspension Swish and spit 5 mLs 3 (three) times daily as needed for mouth pain. 11/04/23   Adela Holter, DO  magnesium  oxide (MAG-OX) 400 MG tablet Take 2 tablets (800 mg total) by mouth daily. 11/11/23   Adela Holter, DO  metoCLOPramide  (REGLAN ) 10 MG tablet Take 1 tablet (10 mg total) by mouth daily as needed for nausea or vomiting (upset stomach). 11/04/23   Adela Holter, DO  Multiple Vitamin (MULTI-VITAMIN) tablet Take 1 tablet by mouth daily. 02/14/23   Adela Holter, DO  nitroGLYCERIN  (NITROSTAT ) 0.4 MG SL tablet Place 0.4 mg under the tongue every 5 (five) minutes x 3 doses as needed for chest pain.    [provider]  ondansetron  (ZOFRAN -ODT) 4 MG disintegrating tablet Take 1 tablet (4 mg total) by mouth every 8 (eight) hours as needed for nausea or vomiting. 08/14/23   Adela Holter, DO  pantoprazole  (PROTONIX ) 40 MG tablet Take 1 tablet (40 mg total) by mouth daily. 08/15/23   Adela Holter, DO  polyethylene glycol powder (MIRALAX ) 17 GM/SCOOP powder Mix 17 grams (1 capful) in 8 oz of liquid and drink once a day 06/11/23   Leona Rake, MD  tamsulosin  (FLOMAX ) 0.4 MG CAPS capsule Take 1 capsule (0.4 mg total) by mouth daily. 11/04/23   Adela Holter, DO  testosterone  (ANDROGEL ) 50 MG/5GM (1%) GEL Place 5 g (1 packet) onto the shoulders and upper arms daily as directed. 11/24/23 12/25/23  Adela Holter, DO  tiZANidine  (ZANAFLEX ) 4 MG tablet Take 1-1&1/2  tablets (4-6 mg total) by mouth every 8 (eight) hours as needed for muscle spasms. 11/06/23   Gean Keels, MD    Family History Family History  Problem Relation Age of Onset  COPD Mother    Anxiety disorder Mother    Depression Mother    Colon cancer Mother    Cancer Father        mets, origin unknown   Anxiety disorder Father    Depression Father    Coronary artery disease Father    CAD Brother    Leukemia Maternal Grandmother    Multiple sclerosis Daughter    Diabetes Neg Hx     Social History Social History   Tobacco Use   Smoking status: Former    Current packs/day: 0.00    Average packs/day: 1 pack/day for 30.0 years (30.0 ttl pk-yrs)    Types: Cigarettes    Start date: 15    Quit date: 2007    Years since quitting: 18.3   Smokeless tobacco: Never  Vaping Use   Vaping status: Never Used  Substance Use Topics   Alcohol use: Not Currently   Drug use: Not Currently     Allergies   Kiwi extract, Other, Penicillins, Ancef [cefazolin], Latex, Levaquin [levofloxacin], and Peach [prunus persica]   Review of Systems Review of Systems  Skin:  Positive for wound.     Physical Exam Triage Vital Signs ED Triage Vitals  Encounter Vitals Group     BP 12/13/23 0829 (!) 150/83     Systolic BP Percentile --      Diastolic BP Percentile --      Pulse Rate 12/13/23 0829 82     Resp 12/13/23 0829 18     Temp 12/13/23 0829 98.7 F (37.1 C)     Temp Source 12/13/23 0829 Oral     SpO2 12/13/23 0829 95 %     Weight --      Height --      Head Circumference --      Peak Flow --      Pain Score 12/13/23 0830 7     Pain Loc --      Pain Education --      Exclude from Growth Chart --    No data found.  Updated Vital Signs BP (!) 150/83 (BP Location: Right Arm)   Pulse 82   Temp 98.7 F (37.1 C) (Oral)   Resp 18   SpO2 95%   Visual Acuity Right Eye Distance:   Left Eye Distance:   Bilateral Distance:    Right Eye Near:   Left Eye Near:     Bilateral Near:     Physical Exam Vitals and nursing note reviewed.  Constitutional:      General: He is not in acute distress.    Appearance: Normal appearance. He is not ill-appearing.  HENT:     Head: Normocephalic and atraumatic.  Eyes:     Pupils: Pupils are equal, round, and reactive to light.  Cardiovascular:     Rate and Rhythm: Normal rate.  Pulmonary:     Effort: Pulmonary effort is normal.  Skin:    General: Skin is warm and dry.          Comments: There is a single insect bite to the left medial ankle with surrounding mild erythema and warmth.  Mild bloody drainage.  Area is tender to palpation.  Neurological:     General: No focal deficit present.     Mental Status: He is alert and oriented to person, place, and time.  Psychiatric:        Mood and Affect: Mood normal.        Behavior:  Behavior normal.      UC Treatments / Results  Labs (all labs ordered are listed, but only abnormal results are displayed) Labs Reviewed - No data to display  EKG   Radiology No results found.  Procedures Procedures (including critical care time)  Medications Ordered in UC Medications - No data to display  Initial Impression / Assessment and Plan / UC Course  I have reviewed the triage vital signs and the nursing notes.  Pertinent labs & imaging results that were available during my care of the patient were reviewed by me and considered in my medical decision making (see chart for details).     Reviewed exam and symptoms with patient.  No red flags.  Will start doxycycline  twice daily for 7 days.  Advised to keep clean and dry and may elevate and ice as needed.  OTC hydrocortisone as needed for itching.  PCP follow-up 2 to 3 days for recheck.  ER precautions reviewed and patient verbalized understanding. Final Clinical Impressions(s) / UC Diagnoses   Final diagnoses:  Infected insect bite of left ankle, initial encounter     Discharge Instructions      Keep  area clean and dry.  Start doxycycline  twice daily for 7 days.  You may use over-the-counter hydrocortisone cream for itching as needed.  Elevate and ice the area as needed.  Follow-up with your PCP in 2 to 3 days for recheck.  Please go to the ER if you develop any worsening symptoms.  Hope you feel better soon!   ED Prescriptions     Medication Sig Dispense Auth. Provider   doxycycline  (VIBRAMYCIN ) 100 MG capsule Take 1 capsule (100 mg total) by mouth 2 (two) times daily for 7 days. 14 capsule Riki Gehring, Jodi R, NP      PDMP not reviewed this encounter.   Alleen Arbour, NP 12/13/23 [phone]

## 2023-12-13 NOTE — Telephone Encounter (Signed)
Pt requested pharmacy change.

## 2023-12-13 NOTE — Discharge Instructions (Signed)
 Keep area clean and dry.  Start doxycycline  twice daily for 7 days.  You may use over-the-counter hydrocortisone cream for itching as needed.  Elevate and ice the area as needed.  Follow-up with your PCP in 2 to 3 days for recheck.  Please go to the ER if you develop any worsening symptoms.  Hope you feel better soon!

## 2023-12-14 ENCOUNTER — Ambulatory Visit (INDEPENDENT_AMBULATORY_CARE_PROVIDER_SITE_OTHER)
Admission: EM | Admit: 2023-12-14 | Discharge: 2023-12-14 | Disposition: A | Discharge status: Home / Self Care | Payer: Medicare (Managed Care) | Source: Home / Self Care

## 2023-12-14 ENCOUNTER — Other Ambulatory Visit: Payer: Self-pay

## 2023-12-14 DIAGNOSIS — S91002D Unspecified open wound, left ankle, subsequent encounter: Secondary | ICD-10-CM

## 2023-12-14 DIAGNOSIS — L03116 Cellulitis of left lower limb: Secondary | ICD-10-CM | POA: Diagnosis not present

## 2023-12-14 DIAGNOSIS — M25572 Pain in left ankle and joints of left foot: Secondary | ICD-10-CM | POA: Insufficient documentation

## 2023-12-14 MED ORDER — HYDROCODONE-ACETAMINOPHEN 5-325 MG PO TABS: 0.5000 | ORAL_TABLET | Freq: Two times a day (BID) | ORAL | 0 refills | Status: DC | PRN

## 2023-12-14 MED ORDER — MUPIROCIN 2 % EX OINT: 1.0000 | TOPICAL_OINTMENT | Freq: Two times a day (BID) | CUTANEOUS | 0 refills | Status: AC

## 2023-12-14 NOTE — Discharge Instructions (Signed)
 Keep this area clean with soap and water.  Apply Bactroban  ointment with dressing changes.  It is very important that you follow-up soon as possible with wound care.  I have placed a referral but I would also like you to call them first thing tomorrow to see if you can schedule an appointment.  Continue the antibiotics that you are previously prescribed.  I have called in a few doses of hydrocodone .  You should limit use of this medication as much as possible as it is both addictive and sedating.  We are not able to provide any refills of this medicine.  Use Tylenol  for additional pain relief.  If you have any rapid spread of redness, fever, numbness or tingling in your foot, difficulty walking, nausea/vomiting you need to be seen immediately.  I recommend drawling online around the area of redness to see if this is spreading significantly.

## 2023-12-14 NOTE — ED Provider Notes (Signed)
 [facility]    CSN: [id number] Arrival date & time: 12/14/23  1007      History   Chief Complaint Chief Complaint  Patient presents with   Wound Check    HPI Brandon [name] is a 68 y.o. male.   Patient presents today for reevaluation of left medial ankle pain.  Reports that 3 days ago he noticed a small pustule and this began draining.  Over the past several days he has developed worsening pain with rapid spread of erythema.  He was seen by our clinic yesterday (12/13/2023) at which point he was started on doxycycline .  He has been taking this medication but is only had 2 doses.  He is tolerated this well as he does have a history of allergies to multiple antibiotics.  He presents today because the pain has worsened and the redness has spread.  Currently pain is rated 10 on a 0-10 pain scale, described as sharp, worse with palpation, no alleviating factors identified.  He has tried over-the-counter analgesics without improvement of symptoms.  Denies any fever, nausea, vomiting.  He is concerned because this area is spreading and he has a history of diabetes.  He does report chronic hyperglycemia but is working with his PCP to improve this.  He has not seen wound specialist.  He is up-to-date on his tetanus vaccination.    Past Medical History:  Diagnosis Date   Anxiety    Arthritis    Basal cell carcinoma (BCC) in situ of skin    left neck   Chronic back pain    has spinal cord stimulator and wears buprenorphine  patch   Colon polyps    Coronary artery disease    S/p DES to mRCA in 12/21 // S/p DES to LCx x 2 in 12/2020 // Diff dz in small to mod Dx (not a good target for PCI); OM1 100 CTO; severe dz in small OM2 and PDA >> Med Rx   Depression    Diabetes mellitus without complication (HCC)    type 2   Fibromyalgia    Gallstones    GERD (gastroesophageal reflux disease)    Hepatitis B    HLD (hyperlipidemia)    Hypertension    Osteoarthritis    Pneumonia    PONV  (postoperative nausea and vomiting)    SBO (small bowel obstruction) (HCC)    Sleep apnea    does not use CPAP    Patient Active Problem List   Diagnosis Date Noted   Injury of face 10/30/2023   Fall 09/17/2023   Right wrist pain 09/17/2023   Acute pain of right knee 09/17/2023   Right facial pain 09/17/2023   Laceration of right eyebrow 09/17/2023   Sore throat 08/08/2023   Pre-ulcerative corn or callous 07/29/2023   Morbid obesity (HCC) 07/29/2023   Hypokalemia 06/08/2023   Anxiety and depression 06/08/2023   Chronic back pain 06/08/2023   BPH (benign prostatic hyperplasia) 06/08/2023   GERD (gastroesophageal reflux disease) 06/08/2023   History of colonic polyps 12/19/2022   Family history of colorectal cancer 12/19/2022   Nocturnal oxygen  desaturation 11/06/2022   Ileus (HCC) 07/12/2022   Radicular pain in right arm 05/02/2022   Neuroforaminal stenosis of cervical spine 05/02/2022   Well adult exam 03/26/2022   Partial small bowel obstruction (HCC) 01/09/2022   Small bowel obstruction (HCC) 12/30/2021   AMS (altered mental status) 11/01/2021   Diabetes mellitus (HCC) 11/01/2021   Community acquired pneumonia 10/31/2021   Coughing  10/29/2021   Pain management contract signed 10/11/2021   History of lumbar fusion (L4/5) 10/02/2021   Chronic pain syndrome 06/01/2021   Obstructive sleep apnea 05/28/2021   Metallic taste 05/21/2021   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   Acute pain of left shoulder 04/03/2021   Unstable angina (HCC)    Insomnia 09/03/2020   Status post coronary artery stent placement    Mixed hyperlipidemia    Coronary artery disease    Chest pain 08/11/2020   Lumbar facet arthropathy 06/22/2020   Major depression in partial remission (HCC) 06/04/2020   Hyperlipidemia associated with type 2 diabetes mellitus (HCC) 04/14/2020   Primary hypertension 04/07/2020   Diabetes mellitus type 2, uncomplicated, on long term insulin  pump (HCC) 04/07/2020    Spinal cord stimulator status 05/19/2017   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   Erectile dysfunction 10/09/2015    Past Surgical History:  Procedure Laterality Date   CARDIAC CATHETERIZATION  01/17/2021   CATARACT EXTRACTION W/ INTRAOCULAR LENS  IMPLANT, BILATERAL     CHOLECYSTECTOMY     CHOLECYSTECTOMY, LAPAROSCOPIC     COLONOSCOPY WITH PROPOFOL  N/A 12/19/2022   Procedure: COLONOSCOPY WITH PROPOFOL ;  Surgeon: Elois Hair, MD;  Location: [facility] ENDOSCOPY;  Service: Gastroenterology;  Laterality: N/A;   CORONARY STENT INTERVENTION N/A 08/15/2020   Procedure: CORONARY STENT INTERVENTION;  Surgeon: Lucendia Rusk, MD;  Location: [facility] INVASIVE CV LAB;  Service: Cardiovascular;  Laterality: N/A;   CORONARY STENT INTERVENTION N/A 01/17/2021   Procedure: CORONARY STENT INTERVENTION;  Surgeon: Odie Benne, MD;  Location: [facility] INVASIVE CV LAB;  Service: Cardiovascular;  Laterality: N/A;   CORONARY ULTRASOUND/IVUS N/A 08/15/2020   Procedure: Intravascular Ultrasound/IVUS;  Surgeon: Lucendia Rusk, MD;  Location: [facility] INVASIVE CV LAB;  Service: Cardiovascular;  Laterality: N/A;   DRUG INDUCED ENDOSCOPY N/A 05/23/2022   Procedure: DRUG INDUCED SLEEP ENDOSCOPY;  Surgeon: Ammon Bales, MD;  Location: [facility] OR;  Service: ENT;  Laterality: N/A;   HERNIA REPAIR     IMPLANTATION OF HYPOGLOSSAL NERVE STIMULATOR Right 07/30/2022   Procedure: IMPLANTATION OF HYPOGLOSSAL NERVE STIMULATOR;  Surgeon: Ammon Bales, MD;  Location: [facility];  Service: ENT;  Laterality: Right;   LEFT HEART CATH AND CORONARY ANGIOGRAPHY N/A 08/15/2020   Procedure: LEFT HEART CATH AND CORONARY ANGIOGRAPHY;  Surgeon: Lucendia Rusk, MD;  Location: [facility] INVASIVE CV LAB;  Service: Cardiovascular;  Laterality: N/A;   LEFT HEART CATH AND CORONARY ANGIOGRAPHY N/A 01/17/2021   Procedure: LEFT HEART CATH AND CORONARY ANGIOGRAPHY;  Surgeon: Odie Benne, MD;  Location: [facility] INVASIVE CV LAB;   Service: Cardiovascular;  Laterality: N/A;   LUMBAR FUSION     L3-5 with rods   MICRODISCECTOMY LUMBAR  2006   POLYPECTOMY  12/19/2022   Procedure: POLYPECTOMY;  Surgeon: Elois Hair, MD;  Location: [facility] ENDOSCOPY;  Service: Gastroenterology;;   SPINAL CORD STIMULATOR INSERTION     blood clot removed from spinal cord   SPINE SURGERY     blood clot removed from spinal cord   XI ROBOTIC ASSISTED INGUINAL HERNIA REPAIR WITH MESH     x 3 surgeries       Home Medications    Prior to Admission medications   Medication Sig Start Date End Date Taking? Authorizing Provider  HYDROcodone -acetaminophen  (NORCO/VICODIN) 5-325 MG tablet Take 0.5-1 tablets by mouth 2 (two) times daily as needed for up to 2 days. 12/14/23 12/16/23 Yes Ardene Remley K, PA-C  mupirocin  ointment (BACTROBAN ) 2 % Apply 1 Application topically 2 (  two) times daily. 12/14/23  Yes Julie-Anne Torain K, PA-C  acetaminophen  (TYLENOL ) 500 MG tablet Take 1,000 mg by mouth See admin instructions. Take 2 tablets (1000 mg) by mouth scheduled in the morning & may take 2 tablets (1000 mg) by mouth every 6 hours if needed for pain.    [provider]  albuterol  (VENTOLIN  HFA) 108 (90 Base) MCG/ACT inhaler Inhale 2 puffs into the lungs as needed for wheezing or shortness of breath (When sick). Patient not taking: Reported on 11/17/2023 10/29/21   [provider]  AMBULATORY NON FORMULARY MEDICATION Please provide home oxygen  for nocturnal use.  Apply 2-4L of O2 via nasal cannula at bedtime.  Dx: Nocturnal Oxygen  Desaturation G47.34 11/06/22   Adela Holter, DO  AMBULATORY NON FORMULARY MEDICATION Please provide humidifier for home oxygen  due to nasal irritation with use. 11/13/22   Adela Holter, DO  ammonium lactate  (AMLACTIN) 12 % cream Apply to the affected area(s) topically as needed for dry skin. 11/07/23   Sikora, Rebecca, DPM  ARIPiprazole  (ABILIFY ) 10 MG tablet Take 1 tablet (10 mg total) by mouth every morning. 05/23/23    Adela Holter, DO  atorvastatin  (LIPITOR ) 80 MG tablet Take 1 tablet (80 mg total) by mouth daily. 08/15/23   Marlyse Single T, PA-C  b complex vitamins capsule Take 1 capsule by mouth in the morning.    [provider]  buPROPion  (WELLBUTRIN  XL) 150 MG 24 hr tablet Take 1 tablet (150 mg total) by mouth daily in addition to 300mg  tablet. 03/25/23   Adela Holter, DO  buPROPion  (WELLBUTRIN  XL) 300 MG 24 hr tablet Take 1 tablet (300 mg total) by mouth daily. 08/15/23   Adela Holter, DO  clopidogrel  (PLAVIX ) 75 MG tablet Take 1 tablet (75 mg total) by mouth daily with breakfast. 12/20/22   Elois Hair, MD  Continuous Glucose Sensor (DEXCOM G6 SENSOR) MISC Use as directed. Change sensor every 10 days 03/11/23   Adela Holter, DO  Continuous Glucose Sensor (DEXCOM G6 SENSOR) MISC Use as directed. Change sensor every 10 days 05/29/23   Adela Holter, DO  doxycycline  (VIBRAMYCIN ) 100 MG capsule Take 1 capsule (100 mg total) by mouth 2 (two) times daily for 7 days. 12/13/23 12/20/23  Mayer, Jodi R, NP  empagliflozin  (JARDIANCE ) 25 MG TABS tablet Take 1 tablet (25 mg total) by mouth daily. 10/27/23   Adela Holter, DO  GVOKE HYPOPEN  1-PACK 1 MG/0.2ML SOAJ Use as needed for hypoglycemia 05/13/22   Emilie Harden, MD  insulin  lispro (HUMALOG ) 100 UNIT/ML injection For use in pump, total of 60 units per day 08/07/23   Emilie Harden, MD  Iron , Ferrous Sulfate , 325 (65 Fe) MG TABS Take 1 tablet (325 mg) by mouth daily. 05/21/23   Adela Holter, DO  magic mouthwash (nystatin , lidocaine , diphenhydrAMINE, alum & mag hydroxide) suspension Swish and spit 5 mLs 3 (three) times daily as needed for mouth pain. 11/04/23   Adela Holter, DO  magnesium  oxide (MAG-OX) 400 MG tablet Take 2 tablets (800 mg total) by mouth daily. 11/11/23   Adela Holter, DO  metoCLOPramide  (REGLAN ) 10 MG tablet Take 1 tablet (10 mg total) by mouth daily as needed for nausea or vomiting (upset stomach). 11/04/23   Adela Holter, DO  Multiple Vitamin (MULTI-VITAMIN) tablet Take 1 tablet by mouth daily. 02/14/23   Adela Holter, DO  nitroGLYCERIN  (NITROSTAT ) 0.4 MG SL tablet Place 0.4 mg under the tongue every 5 (five) minutes x 3 doses as needed for chest pain.  [provider]  ondansetron  (ZOFRAN -ODT) 4 MG disintegrating tablet Take 1 tablet (4 mg total) by mouth every 8 (eight) hours as needed for nausea or vomiting. 08/14/23   Adela Holter, DO  pantoprazole  (PROTONIX ) 40 MG tablet Take 1 tablet (40 mg total) by mouth daily. 08/15/23   Adela Holter, DO  tamsulosin  (FLOMAX ) 0.4 MG CAPS capsule Take 1 capsule (0.4 mg total) by mouth daily. 11/04/23   Adela Holter, DO  testosterone  (ANDROGEL ) 50 MG/5GM (1%) GEL Place 5 g (1 packet) onto the shoulders and upper arms daily as directed. 11/24/23 12/25/23  Adela Holter, DO  tiZANidine  (ZANAFLEX ) 4 MG tablet Take 1-1&1/2 tablets (4-6 mg total) by mouth every 8 (eight) hours as needed for muscle spasms. 11/06/23   Gean Keels, MD    Family History Family History  Problem Relation Age of Onset   COPD Mother    Anxiety disorder Mother    Depression Mother    Colon cancer Mother    Cancer Father        mets, origin unknown   Anxiety disorder Father    Depression Father    Coronary artery disease Father    CAD Brother    Leukemia Maternal Grandmother    Multiple sclerosis Daughter    Diabetes Neg Hx     Social History Social History   Tobacco Use   Smoking status: Former    Current packs/day: 0.00    Average packs/day: 1 pack/day for 30.0 years (30.0 ttl pk-yrs)    Types: Cigarettes    Start date: [birth year]    Quit date: 2007    Years since quitting: 18.3   Smokeless tobacco: Never  Vaping Use   Vaping status: Never Used  Substance Use Topics   Alcohol use: Not Currently   Drug use: Not Currently     Allergies   Kiwi extract, Other, Penicillins, Ancef [cefazolin], Latex, Levaquin [levofloxacin], and Peach [prunus  persica]   Review of Systems Review of Systems  Constitutional:  Positive for activity change. Negative for appetite change, fatigue and fever.  Gastrointestinal:  Negative for abdominal pain, diarrhea, nausea and vomiting.  Musculoskeletal:  Positive for arthralgias and joint swelling. Negative for myalgias.  Skin:  Positive for color change and wound.  Neurological:  Negative for weakness and numbness.     Physical Exam Triage Vital Signs ED Triage Vitals  Encounter Vitals Group     BP 12/14/23 1011 (!) 149/80     Systolic BP Percentile --      Diastolic BP Percentile --      Pulse Rate 12/14/23 1011 86     Resp 12/14/23 1011 16     Temp 12/14/23 1011 97.9 F (36.6 C)     Temp Source 12/14/23 1011 Oral     SpO2 12/14/23 1011 93 %     Weight --      Height --      Head Circumference --      Peak Flow --      Pain Score 12/14/23 1016 8     Pain Loc --      Pain Education --      Exclude from Growth Chart --    No data found.  Updated Vital Signs BP (!) 149/80   Pulse 86   Temp 97.9 F (36.6 C) (Oral)   Resp 16   SpO2 93%   Visual Acuity Right Eye Distance:   Left Eye Distance:   Bilateral Distance:  Right Eye Near:   Left Eye Near:    Bilateral Near:     Physical Exam Vitals reviewed.  Constitutional:      General: He is awake.     Appearance: Normal appearance. He is well-developed. He is not ill-appearing.     Comments: Very pleasant male appears stated age in no acute distress sitting comfortably in exam room  HENT:     Head: Normocephalic and atraumatic.  Cardiovascular:     Rate and Rhythm: Normal rate and regular rhythm.     Pulses:          Posterior tibial pulses are 1+ on the left side.     Heart sounds: Normal heart sounds, S1 normal and S2 normal. No murmur heard.    Comments: Capillary refill within 2 seconds left toes. Pulmonary:     Effort: Pulmonary effort is normal.     Breath sounds: Normal breath sounds. No stridor. No  wheezing, rhonchi or rales.     Comments: Clear to auscultation bilaterally Musculoskeletal:     Right lower leg: No edema.     Left lower leg: 2+ Edema (ankle) present.     Left ankle: Swelling present. Tenderness present. Normal range of motion.  Skin:    Findings: Erythema and wound present.     Comments: 1 cm wound noted medial left ankle with surrounding erythema measuring approximately 5.5 x 4 cm.  No active bleeding or drainage noted.  Neurological:     Mental Status: He is alert.  Psychiatric:        Behavior: Behavior is cooperative.      UC Treatments / Results  Labs (all labs ordered are listed, but only abnormal results are displayed) Labs Reviewed  AEROBIC CULTURE W GRAM STAIN (SUPERFICIAL SPECIMEN)  CBC WITH DIFFERENTIAL/PLATELET  COMPREHENSIVE METABOLIC PANEL WITH GFR    EKG   Radiology No results found.  Procedures Procedures (including critical care time)  Medications Ordered in UC Medications - No data to display  Initial Impression / Assessment and Plan / UC Course  I have reviewed the triage vital signs and the nursing notes.  Pertinent labs & imaging results that were available during my care of the patient were reviewed by me and considered in my medical decision making (see chart for details).     Patient is well-appearing, afebrile, nontoxic, nontachycardic.  He does have increasing redness but has only had 2 doses of the doxycycline .  Wound culture was obtained and we will contact him if we need to change his antibiotics based on culture results but in the meantime we will continue doxycycline .  Will add topical mupirocin  and we discussed the importance of wound care.  CBC and CMP were obtained and if he has significant leukocytosis or abnormal kidney function he would need to go to the ER.  There does appear to be a small area that might be necrotic and he was concerned about any brown recluse bite.  We discussed that it is possible that this is  the cause of his injury but that that would not significantly change our management at this time.  I did refer him urgently to wound care particular given his history of diabetes with hyperglycemia to ensure this is healing appropriately.  He is unable to take NSAIDs due to antiplatelet use.  He does report severe pain and has taken hydrocodone  in the past.  Review of his [place]  controlled substance database registry shows previous prescription for buprenorphine  that  was last filled 1 month ago.  He reports he has not used this in multiple weeks.  We did discuss risks of taking hydrocodone  including addiction and sedation to which he expressed understanding.  We also discussed that I cannot provide any refills of this medication in urgent care to which he expressed understanding.  Recommended that he demarcate area of erythema with a marker when he gets home to monitor for rapid expansion and if this occurs he is to go to the ER.  We discussed that if anything changes and he has increasing pain, swelling, numbness or paresthesias in his foot, fever, change in characteristic of wound, nausea, vomiting he needs to go to the ER.  Strict return precautions given.  All questions were answered to patient satisfaction.  Final Clinical Impressions(s) / UC Diagnoses   Final diagnoses:  Cellulitis of left ankle  Wound of left ankle, subsequent encounter  Acute left ankle pain     Discharge Instructions      Keep this area clean with soap and water.  Apply Bactroban  ointment with dressing changes.  It is very important that you follow-up soon as possible with wound care.  I have placed a referral but I would also like you to call them first thing tomorrow to see if you can schedule an appointment.  Continue the antibiotics that you are previously prescribed.  I have called in a few doses of hydrocodone .  You should limit use of this medication as much as possible as it is both addictive and sedating.  We  are not able to provide any refills of this medicine.  Use Tylenol  for additional pain relief.  If you have any rapid spread of redness, fever, numbness or tingling in your foot, difficulty walking, nausea/vomiting you need to be seen immediately.  I recommend drawling online around the area of redness to see if this is spreading significantly.     ED Prescriptions     Medication Sig Dispense Auth. Provider   mupirocin  ointment (BACTROBAN ) 2 % Apply 1 Application topically 2 (two) times daily. 22 g Ora Bollig K, PA-C   HYDROcodone -acetaminophen  (NORCO/VICODIN) 5-325 MG tablet Take 0.5-1 tablets by mouth 2 (two) times daily as needed for up to 2 days. 4 tablet Kleo Dungee K, PA-C      I have reviewed the PDMP during this encounter.   Budd Cargo, PA-C 12/14/23 1055

## 2023-12-14 NOTE — ED Triage Notes (Signed)
 Pt states he was at work 3 days ago and started having severe pain in left ankle and noticed a pustule on it. Pt states he "popped it and was seen here for the wound yesterday and placed on atx. Pt states the wound is more swollen and painful today. The skin surrounding the wound is very erythematous. There is 2+ swelling of left ankle. The area is hot to touch. Pt limped to triage. Pt states tylenol  not helping for pain

## 2023-12-15 ENCOUNTER — Other Ambulatory Visit (HOSPITAL_BASED_OUTPATIENT_CLINIC_OR_DEPARTMENT_OTHER): Payer: Self-pay

## 2023-12-15 LAB — COMPREHENSIVE METABOLIC PANEL WITH GFR
ALT: 14 IU/L (ref 0–44)
AST: 22 IU/L (ref 0–40)
Albumin: 4.5 g/dL (ref 3.9–4.9)
Alkaline Phosphatase: 100 IU/L (ref 44–121)
BUN/Creatinine Ratio: 8 — ABNORMAL LOW (ref 10–24)
BUN: 7 mg/dL — ABNORMAL LOW (ref 8–27)
Bilirubin Total: 0.5 mg/dL (ref 0.0–1.2)
CO2: 17 mmol/L — ABNORMAL LOW (ref 20–29)
Calcium: 9.3 mg/dL (ref 8.6–10.2)
Chloride: 101 mmol/L (ref 96–106)
Creatinine, Ser: 0.83 mg/dL (ref 0.76–1.27)
Globulin, Total: 2.8 g/dL (ref 1.5–4.5)
Glucose: 278 mg/dL — ABNORMAL HIGH (ref 70–99)
Potassium: 3.8 mmol/L (ref 3.5–5.2)
Sodium: 141 mmol/L (ref 134–144)
Total Protein: 7.3 g/dL (ref 6.0–8.5)
eGFR: 95 mL/min/{1.73_m2} (ref >59)

## 2023-12-15 LAB — CBC WITH DIFFERENTIAL/PLATELET
Basophils Absolute: 0 10*3/uL (ref 0.0–0.2)
Basos: 1 %
EOS (ABSOLUTE): 0 10*3/uL (ref 0.0–0.4)
Eos: 1 %
Hematocrit: 49.5 % (ref 37.5–51.0)
Hemoglobin: 16.7 g/dL (ref 13.0–17.7)
Immature Grans (Abs): 0 10*3/uL (ref 0.0–0.1)
Immature Granulocytes: 0 %
Lymphocytes Absolute: 1.1 10*3/uL (ref 0.7–3.1)
Lymphs: 17 %
MCH: 32.8 pg (ref 26.6–33.0)
MCHC: 33.7 g/dL (ref 31.5–35.7)
MCV: 97 fL (ref 79–97)
Monocytes Absolute: 0.6 10*3/uL (ref 0.1–0.9)
Monocytes: 9 %
Neutrophils Absolute: 4.7 10*3/uL (ref 1.4–7.0)
Neutrophils: 72 %
Platelets: 214 10*3/uL (ref 150–450)
RBC: 5.09 x10E6/uL (ref 4.14–5.80)
RDW: 12.9 % (ref 11.6–15.4)
WBC: 6.5 10*3/uL (ref 3.4–10.8)

## 2023-12-16 ENCOUNTER — Inpatient Hospital Stay (HOSPITAL_BASED_OUTPATIENT_CLINIC_OR_DEPARTMENT_OTHER)
Admission: EM | Admit: 2023-12-16 | Discharge: 2023-12-18 | DRG: 603 | Disposition: A | Discharge status: Home / Self Care | Payer: Medicare (Managed Care) | Attending: Family Medicine | Admitting: Family Medicine

## 2023-12-16 ENCOUNTER — Encounter (HOSPITAL_BASED_OUTPATIENT_CLINIC_OR_DEPARTMENT_OTHER): Payer: Self-pay

## 2023-12-16 ENCOUNTER — Other Ambulatory Visit: Payer: Self-pay

## 2023-12-16 DIAGNOSIS — Z794 Long term (current) use of insulin: Secondary | ICD-10-CM | POA: Diagnosis not present

## 2023-12-16 DIAGNOSIS — Z888 Allergy status to other drugs, medicaments and biological substances status: Secondary | ICD-10-CM

## 2023-12-16 DIAGNOSIS — F419 Anxiety disorder, unspecified: Secondary | ICD-10-CM | POA: Diagnosis present

## 2023-12-16 DIAGNOSIS — E669 Obesity, unspecified: Secondary | ICD-10-CM | POA: Diagnosis present

## 2023-12-16 DIAGNOSIS — I2510 Atherosclerotic heart disease of native coronary artery without angina pectoris: Secondary | ICD-10-CM | POA: Diagnosis present

## 2023-12-16 DIAGNOSIS — Z7984 Long term (current) use of oral hypoglycemic drugs: Secondary | ICD-10-CM | POA: Diagnosis not present

## 2023-12-16 DIAGNOSIS — E782 Mixed hyperlipidemia: Secondary | ICD-10-CM | POA: Diagnosis present

## 2023-12-16 DIAGNOSIS — B1910 Unspecified viral hepatitis B without hepatic coma: Secondary | ICD-10-CM | POA: Diagnosis present

## 2023-12-16 DIAGNOSIS — G4733 Obstructive sleep apnea (adult) (pediatric): Secondary | ICD-10-CM | POA: Diagnosis present

## 2023-12-16 DIAGNOSIS — K219 Gastro-esophageal reflux disease without esophagitis: Secondary | ICD-10-CM | POA: Diagnosis present

## 2023-12-16 DIAGNOSIS — Y929 Unspecified place or not applicable: Secondary | ICD-10-CM

## 2023-12-16 DIAGNOSIS — Z825 Family history of asthma and other chronic lower respiratory diseases: Secondary | ICD-10-CM

## 2023-12-16 DIAGNOSIS — Z955 Presence of coronary angioplasty implant and graft: Secondary | ICD-10-CM

## 2023-12-16 DIAGNOSIS — L03116 Cellulitis of left lower limb: Secondary | ICD-10-CM | POA: Diagnosis not present

## 2023-12-16 DIAGNOSIS — Z79899 Other long term (current) drug therapy: Secondary | ICD-10-CM

## 2023-12-16 DIAGNOSIS — Z85828 Personal history of other malignant neoplasm of skin: Secondary | ICD-10-CM

## 2023-12-16 DIAGNOSIS — N400 Enlarged prostate without lower urinary tract symptoms: Secondary | ICD-10-CM | POA: Diagnosis present

## 2023-12-16 DIAGNOSIS — T63301A Toxic effect of unspecified spider venom, accidental (unintentional), initial encounter: Secondary | ICD-10-CM | POA: Diagnosis present

## 2023-12-16 DIAGNOSIS — E1165 Type 2 diabetes mellitus with hyperglycemia: Secondary | ICD-10-CM | POA: Diagnosis present

## 2023-12-16 DIAGNOSIS — Z8249 Family history of ischemic heart disease and other diseases of the circulatory system: Secondary | ICD-10-CM | POA: Diagnosis not present

## 2023-12-16 DIAGNOSIS — Z87891 Personal history of nicotine dependence: Secondary | ICD-10-CM

## 2023-12-16 DIAGNOSIS — I10 Essential (primary) hypertension: Secondary | ICD-10-CM | POA: Diagnosis present

## 2023-12-16 DIAGNOSIS — F32A Depression, unspecified: Secondary | ICD-10-CM | POA: Diagnosis present

## 2023-12-16 DIAGNOSIS — Z6829 Body mass index (BMI) 29.0-29.9, adult: Secondary | ICD-10-CM

## 2023-12-16 DIAGNOSIS — Z961 Presence of intraocular lens: Secondary | ICD-10-CM | POA: Diagnosis present

## 2023-12-16 DIAGNOSIS — Z820 Family history of epilepsy and other diseases of the nervous system: Secondary | ICD-10-CM

## 2023-12-16 DIAGNOSIS — Z881 Allergy status to other antibiotic agents status: Secondary | ICD-10-CM

## 2023-12-16 DIAGNOSIS — E876 Hypokalemia: Secondary | ICD-10-CM | POA: Diagnosis present

## 2023-12-16 DIAGNOSIS — M797 Fibromyalgia: Secondary | ICD-10-CM | POA: Diagnosis present

## 2023-12-16 DIAGNOSIS — Z91040 Latex allergy status: Secondary | ICD-10-CM

## 2023-12-16 DIAGNOSIS — Z9841 Cataract extraction status, right eye: Secondary | ICD-10-CM

## 2023-12-16 DIAGNOSIS — Z9842 Cataract extraction status, left eye: Secondary | ICD-10-CM

## 2023-12-16 DIAGNOSIS — Z880 Allergy status to penicillin: Secondary | ICD-10-CM | POA: Diagnosis not present

## 2023-12-16 DIAGNOSIS — Z7902 Long term (current) use of antithrombotics/antiplatelets: Secondary | ICD-10-CM

## 2023-12-16 LAB — CBC WITH DIFFERENTIAL/PLATELET
Abs Immature Granulocytes: 0.01 10*3/uL (ref 0.00–0.07)
Basophils Absolute: 0 10*3/uL (ref 0.0–0.1)
Basophils Relative: 0 %
Eosinophils Absolute: 0 10*3/uL (ref 0.0–0.5)
Eosinophils Relative: 1 %
HCT: 44.1 % (ref 39.0–52.0)
Hemoglobin: 15.6 g/dL (ref 13.0–17.0)
Immature Granulocytes: 0 %
Lymphocytes Relative: 15 %
Lymphs Abs: 0.9 10*3/uL (ref 0.7–4.0)
MCH: 33.1 pg (ref 26.0–34.0)
MCHC: 35.4 g/dL (ref 30.0–36.0)
MCV: 93.6 fL (ref 80.0–100.0)
Monocytes Absolute: 0.6 10*3/uL (ref 0.1–1.0)
Monocytes Relative: 10 %
Neutro Abs: 4.7 10*3/uL (ref 1.7–7.7)
Neutrophils Relative %: 74 %
Platelets: 187 10*3/uL (ref 150–400)
RBC: 4.71 MIL/uL (ref 4.22–5.81)
RDW: 12.7 % (ref 11.5–15.5)
WBC: 6.3 10*3/uL (ref 4.0–10.5)
nRBC: 0 % (ref 0.0–0.2)

## 2023-12-16 LAB — BASIC METABOLIC PANEL WITH GFR
Anion gap: 1 — ABNORMAL LOW (ref 5–15)
BUN: 5 mg/dL — ABNORMAL LOW (ref 8–23)
CO2: 37 mmol/L — ABNORMAL HIGH (ref 22–32)
Calcium: 9.1 mg/dL (ref 8.9–10.3)
Chloride: 103 mmol/L (ref 98–111)
Creatinine, Ser: 0.74 mg/dL (ref 0.61–1.24)
GFR, Estimated: >60 mL/min (ref >60)
Glucose, Bld: 285 mg/dL — ABNORMAL HIGH (ref 70–99)
Potassium: 3.7 mmol/L (ref 3.5–5.1)
Sodium: 141 mmol/L (ref 135–145)

## 2023-12-16 LAB — GLUCOSE, CAPILLARY
Glucose-Capillary: 274 mg/dL — ABNORMAL HIGH (ref 70–99)
Glucose-Capillary: 263 mg/dL — ABNORMAL HIGH (ref 70–99)
Glucose-Capillary: 155 mg/dL — ABNORMAL HIGH (ref 70–99)

## 2023-12-16 LAB — AEROBIC CULTURE W GRAM STAIN (SUPERFICIAL SPECIMEN)

## 2023-12-16 LAB — HEMOGLOBIN A1C
Hgb A1c MFr Bld: 11.6 % — ABNORMAL HIGH (ref 4.8–5.6)
Mean Plasma Glucose: 286.22 mg/dL

## 2023-12-16 MED ORDER — SODIUM CHLORIDE 0.9 % IV SOLN
1.0000 g | INTRAVENOUS | Status: DC
  Administered 2023-12-17: 1 g via INTRAVENOUS
  Filled 2023-12-16: qty 10

## 2023-12-16 MED ORDER — INSULIN ASPART 100 UNIT/ML IJ SOLN
0.0000 [IU] | Freq: Three times a day (TID) | INTRAMUSCULAR | Status: DC
  Administered 2023-12-16: 8 [IU] via SUBCUTANEOUS
  Administered 2023-12-17: 3 [IU] via SUBCUTANEOUS
  Administered 2023-12-17 (×2): 5 [IU] via SUBCUTANEOUS
  Administered 2023-12-18 (×2): 3 [IU] via SUBCUTANEOUS

## 2023-12-16 MED ORDER — ACETAMINOPHEN 650 MG RE SUPP: 650.0000 mg | Freq: Four times a day (QID) | RECTAL | Status: DC | PRN

## 2023-12-16 MED ORDER — POLYETHYLENE GLYCOL 3350 17 GM/SCOOP PO POWD: 17.0000 g | Freq: Every day | ORAL | Status: DC | PRN

## 2023-12-16 MED ORDER — NITROGLYCERIN 0.4 MG SL SUBL: 0.4000 mg | SUBLINGUAL_TABLET | SUBLINGUAL | Status: DC | PRN

## 2023-12-16 MED ORDER — VANCOMYCIN HCL IN DEXTROSE 1-5 GM/200ML-% IV SOLN
1000.0000 mg | Freq: Once | INTRAVENOUS | Status: AC
  Administered 2023-12-16: 1000 mg via INTRAVENOUS
  Filled 2023-12-16: qty 200

## 2023-12-16 MED ORDER — BUPROPION HCL ER (XL) 300 MG PO TB24
300.0000 mg | ORAL_TABLET | Freq: Every day | ORAL | Status: DC
Start: 2023-12-16 — End: 2023-12-18
  Administered 2023-12-16 – 2023-12-17 (×2): 300 mg via ORAL
  Filled 2023-12-16 (×2): qty 1

## 2023-12-16 MED ORDER — TESTOSTERONE 50 MG/5GM (1%) TD GEL
5.0000 g | Freq: Every day | TRANSDERMAL | Status: DC
Start: 2023-12-16 — End: 2023-12-18
  Administered 2023-12-17 – 2023-12-18 (×2): 5 g via TRANSDERMAL
  Filled 2023-12-16 (×3): qty 5

## 2023-12-16 MED ORDER — OXYCODONE HCL 5 MG PO TABS
5.0000 mg | ORAL_TABLET | Freq: Once | ORAL | Status: AC
  Administered 2023-12-16: 5 mg via ORAL
  Filled 2023-12-16: qty 1

## 2023-12-16 MED ORDER — ACETAMINOPHEN 500 MG PO TABS
1000.0000 mg | ORAL_TABLET | Freq: Once | ORAL | Status: AC
  Administered 2023-12-16: 1000 mg via ORAL
  Filled 2023-12-16: qty 2

## 2023-12-16 MED ORDER — INSULIN PUMP: Freq: Three times a day (TID) | SUBCUTANEOUS | Status: DC

## 2023-12-16 MED ORDER — MORPHINE SULFATE (PF) 4 MG/ML IV SOLN
4.0000 mg | INTRAVENOUS | Status: AC | PRN
  Administered 2023-12-16 – 2023-12-17 (×3): 4 mg via INTRAVENOUS
  Filled 2023-12-16 (×3): qty 1

## 2023-12-16 MED ORDER — ACETAMINOPHEN 325 MG PO TABS: 650.0000 mg | ORAL_TABLET | Freq: Four times a day (QID) | ORAL | Status: DC | PRN

## 2023-12-16 MED ORDER — CLOPIDOGREL BISULFATE 75 MG PO TABS
75.0000 mg | ORAL_TABLET | Freq: Every day | ORAL | Status: DC
  Administered 2023-12-17 – 2023-12-18 (×2): 75 mg via ORAL
  Filled 2023-12-16 (×2): qty 1

## 2023-12-16 MED ORDER — VANCOMYCIN HCL 1250 MG/250ML IV SOLN
1250.0000 mg | Freq: Two times a day (BID) | INTRAVENOUS | Status: DC
  Administered 2023-12-16 – 2023-12-17 (×3): 1250 mg via INTRAVENOUS
  Filled 2023-12-16 (×4): qty 250

## 2023-12-16 MED ORDER — PANTOPRAZOLE SODIUM 40 MG PO TBEC
40.0000 mg | DELAYED_RELEASE_TABLET | Freq: Every day | ORAL | Status: DC
  Administered 2023-12-17 – 2023-12-18 (×2): 40 mg via ORAL
  Filled 2023-12-16 (×2): qty 1

## 2023-12-16 MED ORDER — MEDIHONEY WOUND/BURN DRESSING EX PSTE
1.0000 | PASTE | Freq: Every day | CUTANEOUS | Status: DC
  Administered 2023-12-16 – 2023-12-17 (×2): 1 via TOPICAL
  Filled 2023-12-16: qty 44

## 2023-12-16 MED ORDER — METOCLOPRAMIDE HCL 10 MG PO TABS: 10.0000 mg | ORAL_TABLET | Freq: Every day | ORAL | Status: DC | PRN

## 2023-12-16 MED ORDER — TAMSULOSIN HCL 0.4 MG PO CAPS
0.4000 mg | ORAL_CAPSULE | Freq: Every day | ORAL | Status: DC
  Administered 2023-12-16 – 2023-12-18 (×3): 0.4 mg via ORAL
  Filled 2023-12-16 (×3): qty 1

## 2023-12-16 MED ORDER — ATORVASTATIN CALCIUM 40 MG PO TABS
80.0000 mg | ORAL_TABLET | Freq: Every day | ORAL | Status: DC
  Administered 2023-12-16 – 2023-12-18 (×3): 80 mg via ORAL
  Filled 2023-12-16 (×3): qty 2

## 2023-12-16 MED ORDER — ARIPIPRAZOLE 10 MG PO TABS
10.0000 mg | ORAL_TABLET | Freq: Every morning | ORAL | Status: DC
Start: 2023-12-17 — End: 2023-12-18
  Administered 2023-12-17 – 2023-12-18 (×2): 10 mg via ORAL
  Filled 2023-12-16 (×2): qty 1

## 2023-12-16 MED ORDER — SODIUM CHLORIDE 0.9 % IV BOLUS
1000.0000 mL | Freq: Once | INTRAVENOUS | Status: AC
  Administered 2023-12-16: 1000 mL via INTRAVENOUS

## 2023-12-16 MED ORDER — TIZANIDINE HCL 4 MG PO TABS: 4.0000 mg | ORAL_TABLET | Freq: Three times a day (TID) | ORAL | Status: DC | PRN

## 2023-12-16 MED ORDER — MAGNESIUM OXIDE -MG SUPPLEMENT 400 (240 MG) MG PO TABS
800.0000 mg | ORAL_TABLET | Freq: Every day | ORAL | Status: DC
  Administered 2023-12-16 – 2023-12-18 (×3): 800 mg via ORAL
  Filled 2023-12-16 (×3): qty 2

## 2023-12-16 MED ORDER — ONDANSETRON HCL 4 MG/2ML IJ SOLN: 4.0000 mg | Freq: Four times a day (QID) | INTRAMUSCULAR | Status: DC | PRN

## 2023-12-16 MED ORDER — ONDANSETRON HCL 4 MG PO TABS: 4.0000 mg | ORAL_TABLET | Freq: Four times a day (QID) | ORAL | Status: DC | PRN

## 2023-12-16 MED ORDER — EMPAGLIFLOZIN 25 MG PO TABS
25.0000 mg | ORAL_TABLET | Freq: Every day | ORAL | Status: DC
  Administered 2023-12-16 – 2023-12-18 (×3): 25 mg via ORAL
  Filled 2023-12-16 (×3): qty 1

## 2023-12-16 MED ORDER — OXYCODONE HCL 5 MG PO TABS
5.0000 mg | ORAL_TABLET | ORAL | Status: DC | PRN
  Administered 2023-12-16 – 2023-12-17 (×3): 5 mg via ORAL
  Filled 2023-12-16 (×3): qty 1

## 2023-12-16 MED ORDER — SODIUM CHLORIDE 0.9 % IV SOLN
1.0000 g | Freq: Once | INTRAVENOUS | Status: AC
  Administered 2023-12-16: 1 g via INTRAVENOUS
  Filled 2023-12-16: qty 10

## 2023-12-16 MED ORDER — VANCOMYCIN HCL 2000 MG/400ML IV SOLN
2000.0000 mg | Freq: Once | INTRAVENOUS | Status: DC
  Filled 2023-12-16: qty 400

## 2023-12-16 NOTE — Progress Notes (Signed)
 ED Pharmacy Antibiotic Sign Off An antibiotic consult was received from an ED provider for vancomycin  per pharmacy dosing for cellulitis. A chart review was completed to assess appropriateness.   The following one time order(s) were placed:  Vancomycin  2000 mg IV x1  Further antibiotic and/or antibiotic pharmacy consults should be ordered by the admitting provider if indicated.   Thank you for allowing pharmacy to be a part of this patient's care.   Volney Grumbles, PharmD PGY-1 Acute Care Pharmacy Resident 12/16/2023 10:05 AM

## 2023-12-16 NOTE — Plan of Care (Signed)

## 2023-12-16 NOTE — H&P (Signed)
 History and Physical    Patient: Brandon [name]:[id number] DOB: 04/23/1956 DOA: 12/16/2023 DOS: the patient was seen and examined on 12/16/2023 PCP: Adela Holter, DO  Patient coming from: Home  Chief Complaint:  Chief Complaint  Patient presents with   Wound Check   HPI: Brandon [name] is a 68 y.o. male with medical history significant of anxiety, osteoarthritis, basal cell carcinoma, chronic back pain, colon polyps, CAD, depression, type 2 diabetes, fibromyalgia, gallstone, GERD, hepatitis B, hyperlipidemia, hypertension, osteoarthritis, history of pneumonia, small bowel obstruction, sleep apnea not on CPAP who presented the emergency department with complaints of left ankle wound that has been getting progressively worse since he first noticed a small piece total. He went to the urgent care on Saturday and was prescribed doxycycline . Patient thinks he was bitten by a spider. He denied fever, chills, rhinorrhea, sore throat, wheezing or hemoptysis. No chest pain, palpitations, diaphoresis, PND, orthopnea or pitting edema of the lower extremities.  No abdominal pain, nausea, emesis, diarrhea, constipation, melena or hematochezia. No flank pain, dysuria, frequency or hematuria.  No polyuria, polydipsia, polyphagia or blurred vision.   Lab work: CBC was normal with a white count of 6.3, hemoglobin 15.6 g/dL platelets down to 87.  BMP showed a CO2 of 37, glucose 285 and BUN 5 mg deciliter, creatinine and the rest of the electrolytes were normal.   ED course: Initial vital signs were temperature 98.1 F, pulse 78, respirations 16, BP 164/79 mmHg O2 sat 98% on room air.  The patient received 1000 mg of Tylenol  p.o., ceftriaxone  1 g IVPB, oxycodone  5 mg p.o., vancomycin  2000 mg IVPB and sodium chloride  1000 mL liter bolus.  Review of Systems: As mentioned in the history of present illness. All other systems reviewed and are negative.  Past Medical History:  Diagnosis Date   Anxiety     Arthritis    Basal cell carcinoma (BCC) in situ of skin    left neck   Chronic back pain    has spinal cord stimulator and wears buprenorphine  patch   Colon polyps    Coronary artery disease    S/p DES to mRCA in 12/21 // S/p DES to LCx x 2 in 12/2020 // Diff dz in small to mod Dx (not a good target for PCI); OM1 100 CTO; severe dz in small OM2 and PDA >> Med Rx   Depression    Diabetes mellitus without complication (HCC)    type 2   Fibromyalgia    Gallstones    GERD (gastroesophageal reflux disease)    Hepatitis B    HLD (hyperlipidemia)    Hypertension    Osteoarthritis    Pneumonia    PONV (postoperative nausea and vomiting)    SBO (small bowel obstruction) (HCC)    Sleep apnea    does not use CPAP   Past Surgical History:  Procedure Laterality Date   CARDIAC CATHETERIZATION  01/17/2021   CATARACT EXTRACTION W/ INTRAOCULAR LENS  IMPLANT, BILATERAL     CHOLECYSTECTOMY     CHOLECYSTECTOMY, LAPAROSCOPIC     COLONOSCOPY WITH PROPOFOL  N/A 12/19/2022   Procedure: COLONOSCOPY WITH PROPOFOL ;  Surgeon: Elois Hair, MD;  Location: [facility] ENDOSCOPY;  Service: Gastroenterology;  Laterality: N/A;   CORONARY STENT INTERVENTION N/A 08/15/2020   Procedure: CORONARY STENT INTERVENTION;  Surgeon: Lucendia Rusk, MD;  Location: [facility] INVASIVE CV LAB;  Service: Cardiovascular;  Laterality: N/A;   CORONARY STENT INTERVENTION N/A 01/17/2021   Procedure: CORONARY STENT INTERVENTION;  Surgeon: Odie Benne, MD;  Location: [facility] INVASIVE CV LAB;  Service: Cardiovascular;  Laterality: N/A;   CORONARY ULTRASOUND/IVUS N/A 08/15/2020   Procedure: Intravascular Ultrasound/IVUS;  Surgeon: Lucendia Rusk, MD;  Location: [facility] INVASIVE CV LAB;  Service: Cardiovascular;  Laterality: N/A;   DRUG INDUCED ENDOSCOPY N/A 05/23/2022   Procedure: DRUG INDUCED SLEEP ENDOSCOPY;  Surgeon: Ammon Bales, MD;  Location: [facility] OR;  Service: ENT;  Laterality: N/A;   HERNIA REPAIR     IMPLANTATION OF  HYPOGLOSSAL NERVE STIMULATOR Right 07/30/2022   Procedure: IMPLANTATION OF HYPOGLOSSAL NERVE STIMULATOR;  Surgeon: Ammon Bales, MD;  Location: [facility];  Service: ENT;  Laterality: Right;   LEFT HEART CATH AND CORONARY ANGIOGRAPHY N/A 08/15/2020   Procedure: LEFT HEART CATH AND CORONARY ANGIOGRAPHY;  Surgeon: Lucendia Rusk, MD;  Location: [facility] INVASIVE CV LAB;  Service: Cardiovascular;  Laterality: N/A;   LEFT HEART CATH AND CORONARY ANGIOGRAPHY N/A 01/17/2021   Procedure: LEFT HEART CATH AND CORONARY ANGIOGRAPHY;  Surgeon: Odie Benne, MD;  Location: [facility] INVASIVE CV LAB;  Service: Cardiovascular;  Laterality: N/A;   LUMBAR FUSION     L3-5 with rods   MICRODISCECTOMY LUMBAR  2006   POLYPECTOMY  12/19/2022   Procedure: POLYPECTOMY;  Surgeon: Elois Hair, MD;  Location: [facility] ENDOSCOPY;  Service: Gastroenterology;;   SPINAL CORD STIMULATOR INSERTION     blood clot removed from spinal cord   SPINE SURGERY     blood clot removed from spinal cord   XI ROBOTIC ASSISTED INGUINAL HERNIA REPAIR WITH MESH     x 3 surgeries   Social History:  reports that he quit smoking about 18 years ago. His smoking use included cigarettes. He started smoking about 48 years ago. He has a 30 pack-year smoking history. He has never used smokeless tobacco. He reports that he does not currently use alcohol. He reports that he does not currently use drugs.  Allergies  Allergen Reactions   Kiwi Extract Swelling    Mouth swelling.    Other Swelling and Other (See Comments)    EGGPLANT. Mouth swelling.   Penicillins Rash    Reaction: unknown   Ancef [Cefazolin] Rash   Latex Rash   Levaquin [Levofloxacin] Rash   Peach [Prunus Persica] Other (See Comments)    Burns mouth    Family History  Problem Relation Age of Onset   COPD Mother    Anxiety disorder Mother    Depression Mother    Colon cancer Mother    Cancer Father        mets, origin unknown   Anxiety disorder  Father    Depression Father    Coronary artery disease Father    CAD Brother    Leukemia Maternal Grandmother    Multiple sclerosis Daughter    Diabetes Neg Hx     Prior to Admission medications   Medication Sig Start Date End Date Taking? Authorizing Provider  acetaminophen  (TYLENOL ) 500 MG tablet Take 1,000 mg by mouth See admin instructions. Take 2 tablets (1000 mg) by mouth scheduled in the morning & may take 2 tablets (1000 mg) by mouth every 6 hours if needed for pain.    [provider]  albuterol  (VENTOLIN  HFA) 108 (90 Base) MCG/ACT inhaler Inhale 2 puffs into the lungs as needed for wheezing or shortness of breath (When sick). Patient not taking: Reported on 11/17/2023 10/29/21   [provider]  AMBULATORY NON FORMULARY MEDICATION Please provide home oxygen  for  nocturnal use.  Apply 2-4L of O2 via nasal cannula at bedtime.  Dx: Nocturnal Oxygen  Desaturation G47.34 11/06/22   Adela Holter, DO  AMBULATORY NON FORMULARY MEDICATION Please provide humidifier for home oxygen  due to nasal irritation with use. 11/13/22   Adela Holter, DO  ammonium lactate  (AMLACTIN) 12 % cream Apply to the affected area(s) topically as needed for dry skin. 11/07/23   Sikora, Rebecca, DPM  ARIPiprazole  (ABILIFY ) 10 MG tablet Take 1 tablet (10 mg total) by mouth every morning. 05/23/23   Adela Holter, DO  atorvastatin  (LIPITOR ) 80 MG tablet Take 1 tablet (80 mg total) by mouth daily. 08/15/23   Marlyse Single T, PA-C  b complex vitamins capsule Take 1 capsule by mouth in the morning.    [provider]  buPROPion  (WELLBUTRIN  XL) 150 MG 24 hr tablet Take 1 tablet (150 mg total) by mouth daily in addition to 300mg  tablet. 03/25/23   Adela Holter, DO  buPROPion  (WELLBUTRIN  XL) 300 MG 24 hr tablet Take 1 tablet (300 mg total) by mouth daily. 08/15/23   Adela Holter, DO  clopidogrel  (PLAVIX ) 75 MG tablet Take 1 tablet (75 mg total) by mouth daily with breakfast. 12/20/22   Elois Hair, MD  Continuous Glucose Sensor (DEXCOM G6 SENSOR) MISC Use as directed. Change sensor every 10 days 03/11/23   Adela Holter, DO  Continuous Glucose Sensor (DEXCOM G6 SENSOR) MISC Use as directed. Change sensor every 10 days 05/29/23   Adela Holter, DO  doxycycline  (VIBRAMYCIN ) 100 MG capsule Take 1 capsule (100 mg total) by mouth 2 (two) times daily for 7 days. 12/13/23 12/20/23  Mayer, Jodi R, NP  empagliflozin  (JARDIANCE ) 25 MG TABS tablet Take 1 tablet (25 mg total) by mouth daily. 10/27/23   Adela Holter, DO  GVOKE HYPOPEN  1-PACK 1 MG/0.2ML SOAJ Use as needed for hypoglycemia 05/13/22   Emilie Harden, MD  HYDROcodone -acetaminophen  (NORCO/VICODIN) 5-325 MG tablet Take 0.5-1 tablets by mouth 2 (two) times daily as needed for up to 2 days. 12/14/23 12/16/23  Raspet, Erin K, PA-C  insulin  lispro (HUMALOG ) 100 UNIT/ML injection For use in pump, total of 60 units per day 08/07/23   Emilie Harden, MD  Iron , Ferrous Sulfate , 325 (65 Fe) MG TABS Take 1 tablet (325 mg) by mouth daily. 05/21/23   Adela Holter, DO  magic mouthwash (nystatin , lidocaine , diphenhydrAMINE, alum & mag hydroxide) suspension Swish and spit 5 mLs 3 (three) times daily as needed for mouth pain. 11/04/23   Adela Holter, DO  magnesium  oxide (MAG-OX) 400 MG tablet Take 2 tablets (800 mg total) by mouth daily. 11/11/23   Adela Holter, DO  metoCLOPramide  (REGLAN ) 10 MG tablet Take 1 tablet (10 mg total) by mouth daily as needed for nausea or vomiting (upset stomach). 11/04/23   Adela Holter, DO  Multiple Vitamin (MULTI-VITAMIN) tablet Take 1 tablet by mouth daily. 02/14/23   Adela Holter, DO  mupirocin  ointment (BACTROBAN ) 2 % Apply 1 Application topically 2 (two) times daily. 12/14/23   Raspet, Erin K, PA-C  nitroGLYCERIN  (NITROSTAT ) 0.4 MG SL tablet Place 0.4 mg under the tongue every 5 (five) minutes x 3 doses as needed for chest pain.    [provider]  ondansetron  (ZOFRAN -ODT) 4 MG disintegrating tablet Take  1 tablet (4 mg total) by mouth every 8 (eight) hours as needed for nausea or vomiting. 08/14/23   Adela Holter, DO  pantoprazole  (PROTONIX ) 40 MG tablet Take 1 tablet (40 mg total) by mouth daily. 08/15/23  Adela Holter, DO  tamsulosin  (FLOMAX ) 0.4 MG CAPS capsule Take 1 capsule (0.4 mg total) by mouth daily. 11/04/23   Adela Holter, DO  testosterone  (ANDROGEL ) 50 MG/5GM (1%) GEL Place 5 g (1 packet) onto the shoulders and upper arms daily as directed. 11/24/23 12/25/23  Adela Holter, DO  tiZANidine  (ZANAFLEX ) 4 MG tablet Take 1-1&1/2 tablets (4-6 mg total) by mouth every 8 (eight) hours as needed for muscle spasms. 11/06/23   Gean Keels, MD    Physical Exam: Vitals:   12/16/23 0981 12/16/23 0837 12/16/23 1243  BP:  (!) 164/79   Pulse:  78   Resp:  16   Temp:  98.1 F (36.7 C) 98 F (36.7 C)  TempSrc:  Oral Oral  SpO2:  98%   Weight: 88.5 kg    Height: 5\' 8"  (1.727 m)     Physical Exam Vitals and nursing note reviewed.  Constitutional:      General: He is awake. He is not in acute distress.    Appearance: Normal appearance. He is ill-appearing.  HENT:     Head: Normocephalic.     Nose: No rhinorrhea.     Mouth/Throat:     Mouth: Mucous membranes are moist.  Eyes:     General: No scleral icterus.    Pupils: Pupils are equal, round, and reactive to light.  Neck:     Vascular: No JVD.  Cardiovascular:     Rate and Rhythm: Normal rate and regular rhythm.     Heart sounds: S1 normal and S2 normal.  Pulmonary:     Effort: Pulmonary effort is normal.     Breath sounds: Normal breath sounds. No wheezing, rhonchi or rales.  Abdominal:     General: Bowel sounds are normal. There is no distension.     Palpations: Abdomen is soft.     Tenderness: There is no abdominal tenderness. There is no guarding.  Musculoskeletal:     Cervical back: Neck supple.     Right lower leg: No edema.     Left lower leg: No edema.  Skin:    General: Skin is warm and dry.      Findings: Erythema and wound present.  Neurological:     General: No focal deficit present.     Mental Status: He is alert and oriented to person, place, and time.  Psychiatric:        Mood and Affect: Mood normal.        Behavior: Behavior normal. Behavior is cooperative.          Data Reviewed:  Results are pending, will review when available.   Assessment and Plan: Principal Problem:   Cellulitis of left lower extremity Admit to MedSurg/inpatient. Continue IV fluids. Continue cefepime 1 g every 24 hours.   Continue vancomycin  per pharmacy. Consult wound care. Follow CBC and CMP in a.m.  Active Problems:   Hypokalemia Replacing.    Primary hypertension Not on antihypertensives. Monitor blood pressure.    Coronary artery disease On atorvastatin  and clopidogrel .    Anxiety and depression Continue bupropion  300 mg p.o. daily.    BPH (benign prostatic hyperplasia) Continue tamsulosin  0.4 mg p.o. daily.    GERD (gastroesophageal reflux disease) Continue pantoprazole  40 mg p.o. daily.    Mixed hyperlipidemia Continue atorvastatin  80 mg p.o. daily.    Obstructive sleep apnea Not on CPAP.    Advance Care Planning:   Code Status: Full Code   Consults:   Family Communication:  Severity of Illness: The appropriate patient status for this patient is INPATIENT. Inpatient status is judged to be reasonable and necessary in order to provide the required intensity of service to ensure the patient's safety. The patient's presenting symptoms, physical exam findings, and initial radiographic and laboratory data in the context of their chronic comorbidities is felt to place them at high risk for further clinical deterioration. Furthermore, it is not anticipated that the patient will be medically stable for discharge from the hospital within 2 midnights of admission.   * I certify that at the point of admission it is my clinical judgment that the patient will require  inpatient hospital care spanning beyond 2 midnights from the point of admission due to high intensity of service, high risk for further deterioration and high frequency of surveillance required.*  Author: Danice Dural, MD 12/16/2023 1:29 PM  For on call review [email or web address].   This document was prepared using Dragon voice recognition software and may contain some unintended transcription errors.

## 2023-12-16 NOTE — Consult Note (Signed)
 WOC Nurse Consult Note: Reason for Consult: L medial ankle wound  Wound type: full thickness infectious, unknown etiology ? Spider bite vs other  Pressure Injury POA: NA, not related to pressure  Measurement: see nursing flowsheet, per MD note approximately 1 cm x 1 cm  Wound bed: 50% dark hemorrhagic 50% necrotic  Drainage (amount, consistency, odor) per nursing flowsheet  Periwound: edema and erythema  Dressing procedure/placement/frequency:  Cleanse L ankle wound with Vashe wound cleanser Timm Foot [phone]) do not rinse and allow to air dry. Apply Medihoney to wound bed daily, cover with dry gauze and secure with silicone foam or ABD pad and Kerlix anchored around foot whichever is preferred.   POC discussed with bedside nurse. WOC team will not follow. Re-consult if further needs arise.   Thank you,    Ronni Colace MSN, RN-BC, [employer] [phone]

## 2023-12-16 NOTE — ED Triage Notes (Addendum)
 States got what he believes is a spider bite on Thursday. States was seen at urgent care Saturday and Sunday. Started on doxycycline  and antibiotic cream/steroid cream. Pain worsening and going up leg. Pitting edema noted to ankle. Denies fevers. Pt is diabetic, states CBG has been greater than 200 recently.

## 2023-12-16 NOTE — Progress Notes (Signed)
 Pharmacy Antibiotic Note  Brandon [name] is a 68 y.o. male admitted on 12/16/2023 with cellulitis.  Pharmacy has been consulted for vancomycin  dosing. Pt is afebrile and WBC is WNL. Pt was loaded with vancomycin  earlier today.   Plan: Vancomycin  1250mg  IV Q12H  F/u renal fxn, C&S, clinical status and peak/trough at SS   Height: 5\' 8"  (172.7 cm) Weight: 88.5 kg (195 lb) IBW/kg (Calculated) : 68.4  Temp (24hrs), Avg:98.1 F (36.7 C), Min:98 F (36.7 C), Max:98.1 F (36.7 C)  Recent Labs  Lab 12/14/23 1042 12/16/23 0928  WBC 6.5 6.3  CREATININE 0.83 0.74    Estimated Creatinine Clearance: 95.5 mL/min (by C-G formula based on SCr of 0.74 mg/dL).    Allergies  Allergen Reactions   Kiwi Extract Swelling    Mouth swelling.    Other Swelling and Other (See Comments)    EGGPLANT. Mouth swelling.   Penicillins Rash    Reaction: unknown   Ancef [Cefazolin] Rash   Latex Rash   Levaquin [Levofloxacin] Rash   Peach [Prunus Persica] Other (See Comments)    Burns mouth    Antimicrobials this admission: Vanc 4/22>> CTX 4/22>>  Dose adjustments this admission: N/A  Microbiology results: Pending  Thank you for allowing pharmacy to be a part of this patient's care.  Laverle Pillard, Kathlene Paradise 12/16/2023 1:34 PM

## 2023-12-16 NOTE — Progress Notes (Signed)
 Plan of Care Note for accepted transfer   Patient: Brandon [name] MRN: [id number]   DOA: 12/16/2023  Facility requesting transfer:  Requesting Provider: Hiawatha Lout, MD Reason for transfer: Cellulitis of left ankle after spider bite.  History of DM. Facility course:  Received 1000 mL normal saline bolus, oxycodone  5 mg p.o., acetaminophen  1000 milligrams p.o. x 1, ceftriaxone  1 g IVPB and was started on vancomycin .  Plan of care: The patient is accepted for admission to Med-surg  unit, at [facility].   Author: Danice Dural, MD 12/16/2023  Check [email or web address] for on-call coverage.  Nursing staff, Please call [facility] Admits & Consults System-Wide number on Amion as soon as patient's arrival, so appropriate admitting provider can evaluate the pt.

## 2023-12-16 NOTE — ED Provider Notes (Signed)
 [facility] Provider Note  CSN: [id number] Arrival date & time: 12/16/23 1191  Chief Complaint(s) Wound Check  HPI Brandon [name] is a 68 y.o. male history of diabetes presenting to the emergency department with rash.  Patient reports that symptoms started last Thursday, ultimately noticed a small bump that had some purulent drainage.  Went to urgent care Saturday at which point he was started on antibiotics, doxycycline  twice daily.  He reports he has been compliant.  He returned to urgent care on Sunday, and cultures were sent.  He reports that symptoms have been progressive despite being on doxycycline , now his edema to the leg and having trouble walking, also reports increased pain.  No fevers or chills, nausea, vomiting, recent travel or surgery.   Past Medical History Past Medical History:  Diagnosis Date   Anxiety    Arthritis    Basal cell carcinoma (BCC) in situ of skin    left neck   Chronic back pain    has spinal cord stimulator and wears buprenorphine  patch   Colon polyps    Coronary artery disease    S/p DES to mRCA in 12/21 // S/p DES to LCx x 2 in 12/2020 // Diff dz in small to mod Dx (not a good target for PCI); OM1 100 CTO; severe dz in small OM2 and PDA >> Med Rx   Depression    Diabetes mellitus without complication (HCC)    type 2   Fibromyalgia    Gallstones    GERD (gastroesophageal reflux disease)    Hepatitis B    HLD (hyperlipidemia)    Hypertension    Osteoarthritis    Pneumonia    PONV (postoperative nausea and vomiting)    SBO (small bowel obstruction) (HCC)    Sleep apnea    does not use CPAP   Patient Active Problem List   Diagnosis Date Noted   Cellulitis of left lower extremity 12/16/2023   Injury of face 10/30/2023   Fall 09/17/2023   Right wrist pain 09/17/2023   Acute pain of right knee 09/17/2023   Right facial pain 09/17/2023   Laceration of right eyebrow 09/17/2023   Sore throat  08/08/2023   Pre-ulcerative corn or callous 07/29/2023   Morbid obesity (HCC) 07/29/2023   Hypokalemia 06/08/2023   Anxiety and depression 06/08/2023   Chronic back pain 06/08/2023   BPH (benign prostatic hyperplasia) 06/08/2023   GERD (gastroesophageal reflux disease) 06/08/2023   History of colonic polyps 12/19/2022   Family history of colorectal cancer 12/19/2022   Nocturnal oxygen  desaturation 11/06/2022   Ileus (HCC) 07/12/2022   Radicular pain in right arm 05/02/2022   Neuroforaminal stenosis of cervical spine 05/02/2022   Well adult exam 03/26/2022   Partial small bowel obstruction (HCC) 01/09/2022   Small bowel obstruction (HCC) 12/30/2021   AMS (altered mental status) 11/01/2021   Diabetes mellitus (HCC) 11/01/2021   Community acquired pneumonia 10/31/2021   Coughing 10/29/2021   Pain management contract signed 10/11/2021   History of lumbar fusion (L4/5) 10/02/2021   Chronic pain syndrome 06/01/2021   Obstructive sleep apnea 05/28/2021   Metallic taste 05/21/2021   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   Acute pain of left shoulder 04/03/2021   Unstable angina (HCC)    Insomnia 09/03/2020   Status post coronary artery stent placement    Mixed hyperlipidemia    Coronary artery disease    Chest pain 08/11/2020   Lumbar facet arthropathy 06/22/2020  Major depression in partial remission (HCC) 06/04/2020   Hyperlipidemia associated with type 2 diabetes mellitus (HCC) 04/14/2020   Primary hypertension 04/07/2020   Diabetes mellitus type 2, uncomplicated, on long term insulin  pump (HCC) 04/07/2020   Spinal cord stimulator status 05/19/2017   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 Authorizing Provider  acetaminophen  (TYLENOL ) 500 MG tablet Take 1,000 mg by mouth See admin instructions. Take 2 tablets (1000 mg) by mouth scheduled in the morning & may  take 2 tablets (1000 mg) by mouth every 6 hours if needed for pain.    [provider]  albuterol  (VENTOLIN  HFA) 108 (90 Base) MCG/ACT inhaler Inhale 2 puffs into the lungs as needed for wheezing or shortness of breath (When sick). Patient not taking: Reported on 11/17/2023 10/29/21   [provider]  AMBULATORY NON FORMULARY MEDICATION Please provide home oxygen  for nocturnal use.  Apply 2-4L of O2 via nasal cannula at bedtime.  Dx: Nocturnal Oxygen  Desaturation G47.34 11/06/22   Adela Holter, DO  AMBULATORY NON FORMULARY MEDICATION Please provide humidifier for home oxygen  due to nasal irritation with use. 11/13/22   Adela Holter, DO  ammonium lactate  (AMLACTIN) 12 % cream Apply to the affected area(s) topically as needed for dry skin. 11/07/23   Sikora, Rebecca, DPM  ARIPiprazole  (ABILIFY ) 10 MG tablet Take 1 tablet (10 mg total) by mouth every morning. 05/23/23   Adela Holter, DO  atorvastatin  (LIPITOR ) 80 MG tablet Take 1 tablet (80 mg total) by mouth daily. 08/15/23   Marlyse Single T, PA-C  b complex vitamins capsule Take 1 capsule by mouth in the morning.    [provider]  buPROPion  (WELLBUTRIN  XL) 150 MG 24 hr tablet Take 1 tablet (150 mg total) by mouth daily in addition to 300mg  tablet. 03/25/23   Adela Holter, DO  buPROPion  (WELLBUTRIN  XL) 300 MG 24 hr tablet Take 1 tablet (300 mg total) by mouth daily. 08/15/23   Adela Holter, DO  clopidogrel  (PLAVIX ) 75 MG tablet Take 1 tablet (75 mg total) by mouth daily with breakfast. 12/20/22   Elois Hair, MD  Continuous Glucose Sensor (DEXCOM G6 SENSOR) MISC Use as directed. Change sensor every 10 days 03/11/23   Adela Holter, DO  Continuous Glucose Sensor (DEXCOM G6 SENSOR) MISC Use as directed. Change sensor every 10 days 05/29/23   Adela Holter, DO  doxycycline  (VIBRAMYCIN ) 100 MG capsule Take 1 capsule (100 mg total) by mouth 2 (two) times daily for 7 days. 12/13/23 12/20/23  Mayer, Jodi R, NP  empagliflozin   (JARDIANCE ) 25 MG TABS tablet Take 1 tablet (25 mg total) by mouth daily. 10/27/23   Adela Holter, DO  GVOKE HYPOPEN  1-PACK 1 MG/0.2ML SOAJ Use as needed for hypoglycemia 05/13/22   Emilie Harden, MD  HYDROcodone -acetaminophen  (NORCO/VICODIN) 5-325 MG tablet Take 0.5-1 tablets by mouth 2 (two) times daily as needed for up to 2 days. 12/14/23 12/16/23  Raspet, Erin K, PA-C  insulin  lispro (HUMALOG ) 100 UNIT/ML injection For use in pump, total of 60 units per day 08/07/23   Emilie Harden, MD  Iron , Ferrous Sulfate , 325 (65 Fe) MG TABS Take 1 tablet (325 mg) by mouth daily. 05/21/23   Adela Holter, DO  magic mouthwash (nystatin , lidocaine , diphenhydrAMINE, alum & mag hydroxide) suspension Swish and spit 5 mLs 3 (three) times daily as needed for mouth pain. 11/04/23   Adela Holter, DO  magnesium   oxide (MAG-OX) 400 MG tablet Take 2 tablets (800 mg total) by mouth daily. 11/11/23   Adela Holter, DO  metoCLOPramide  (REGLAN ) 10 MG tablet Take 1 tablet (10 mg total) by mouth daily as needed for nausea or vomiting (upset stomach). 11/04/23   Adela Holter, DO  Multiple Vitamin (MULTI-VITAMIN) tablet Take 1 tablet by mouth daily. 02/14/23   Adela Holter, DO  mupirocin  ointment (BACTROBAN ) 2 % Apply 1 Application topically 2 (two) times daily. 12/14/23   Raspet, Erin K, PA-C  nitroGLYCERIN  (NITROSTAT ) 0.4 MG SL tablet Place 0.4 mg under the tongue every 5 (five) minutes x 3 doses as needed for chest pain.    [provider]  ondansetron  (ZOFRAN -ODT) 4 MG disintegrating tablet Take 1 tablet (4 mg total) by mouth every 8 (eight) hours as needed for nausea or vomiting. 08/14/23   Adela Holter, DO  pantoprazole  (PROTONIX ) 40 MG tablet Take 1 tablet (40 mg total) by mouth daily. 08/15/23   Adela Holter, DO  tamsulosin  (FLOMAX ) 0.4 MG CAPS capsule Take 1 capsule (0.4 mg total) by mouth daily. 11/04/23   Adela Holter, DO  testosterone  (ANDROGEL ) 50 MG/5GM (1%) GEL Place 5 g (1 packet) onto the  shoulders and upper arms daily as directed. 11/24/23 12/25/23  Adela Holter, DO  tiZANidine  (ZANAFLEX ) 4 MG tablet Take 1-1&1/2 tablets (4-6 mg total) by mouth every 8 (eight) hours as needed for muscle spasms. 11/06/23   Gean Keels, MD                                                                                                                                    Past Surgical History Past Surgical History:  Procedure Laterality Date   CARDIAC CATHETERIZATION  01/17/2021   CATARACT EXTRACTION W/ INTRAOCULAR LENS  IMPLANT, BILATERAL     CHOLECYSTECTOMY     CHOLECYSTECTOMY, LAPAROSCOPIC     COLONOSCOPY WITH PROPOFOL  N/A 12/19/2022   Procedure: COLONOSCOPY WITH PROPOFOL ;  Surgeon: Elois Hair, MD;  Location: [facility] ENDOSCOPY;  Service: Gastroenterology;  Laterality: N/A;   CORONARY STENT INTERVENTION N/A 08/15/2020   Procedure: CORONARY STENT INTERVENTION;  Surgeon: Lucendia Rusk, MD;  Location: [facility] INVASIVE CV LAB;  Service: Cardiovascular;  Laterality: N/A;   CORONARY STENT INTERVENTION N/A 01/17/2021   Procedure: CORONARY STENT INTERVENTION;  Surgeon: Odie Benne, MD;  Location: [facility] INVASIVE CV LAB;  Service: Cardiovascular;  Laterality: N/A;   CORONARY ULTRASOUND/IVUS N/A 08/15/2020   Procedure: Intravascular Ultrasound/IVUS;  Surgeon: Lucendia Rusk, MD;  Location: [facility] INVASIVE CV LAB;  Service: Cardiovascular;  Laterality: N/A;   DRUG INDUCED ENDOSCOPY N/A 05/23/2022   Procedure: DRUG INDUCED SLEEP ENDOSCOPY;  Surgeon: Ammon Bales, MD;  Location: [facility] OR;  Service: ENT;  Laterality: N/A;   HERNIA REPAIR     IMPLANTATION OF HYPOGLOSSAL NERVE STIMULATOR Right 07/30/2022   Procedure: IMPLANTATION OF HYPOGLOSSAL NERVE STIMULATOR;  Surgeon: Ammon Bales, MD;  Location: [facility];  Service: ENT;  Laterality: Right;   LEFT HEART CATH AND CORONARY ANGIOGRAPHY N/A 08/15/2020   Procedure: LEFT HEART CATH AND CORONARY ANGIOGRAPHY;  Surgeon:  Lucendia Rusk, MD;  Location[facility] INVAS[facility] CV LAB;  Service: Cardiovascular;  Laterality: N/A;   LEFT HEART CATH AND CORONARY ANGIOGRAPHY N/A 01/17/2021   Procedure: LEFT HEART CATH AND CORONARY ANGIOGRAPHY;  Surgeon: Odie Benne, MD;  Location: [facility] INVASIVE CV LAB;  Service: Cardiovascular;  Laterality: N/A;   LUMBAR FUSION     L3-5 with rods   MICRODISCECTOMY LUMBAR  2006   POLYPECTOMY  12/19/2022   Procedure: POLYPECTOMY;  Surgeon: Elois Hair, MD;  Location: [facility] ENDOSCOPY;  Service: Gastroenterology;;   SPINAL CORD STIMULATOR INSERTION     blood clot removed from spinal cord   SPINE SURGERY     blood clot removed from spinal cord   XI ROBOTIC ASSISTED INGUINAL HERNIA REPAIR WITH MESH     x 3 surgeries   Family History Family History  Problem Relation Age of Onset   COPD Mother    Anxiety disorder Mother    Depression Mother    Colon cancer Mother    Cancer Father        mets, origin unknown   Anxiety disorder Father    Depression Father    Coronary artery disease Father    CAD Brother    Leukemia Maternal Grandmother    Multiple sclerosis Daughter    Diabetes Neg Hx     Social History Social History   Tobacco Use   Smoking status: Former    Current packs/day: 0.00    Average packs/day: 1 pack/day for 30.0 years (30.0 ttl pk-yrs)    Types: Cigarettes    Start date: 58    Quit date: 2007    Years since quitting: 18.3   Smokeless tobacco: Never  Vaping Use   Vaping status: Never Used  Substance Use Topics   Alcohol use: Not Currently   Drug use: Not Currently   Allergies Kiwi extract, Other, Penicillins, Ancef [cefazolin], Latex, Levaquin [levofloxacin], and Peach [prunus persica]  Review of Systems Review of Systems  All other systems reviewed and are negative.   Physical Exam Vital Signs  I have reviewed the triage vital signs BP (!) 164/79 (BP Location: Right Arm)   Pulse 78   Temp 98.1 F (36.7 C) (Oral)   Resp 16   Ht  5\' 8"  (1.727 m)   Wt 88.5 kg   SpO2 98%   BMI 29.65 kg/m  Physical Exam Vitals and nursing note reviewed.  Constitutional:      General: He is not in acute distress.    Appearance: Normal appearance.  HENT:     Mouth/Throat:     Mouth: Mucous membranes are moist.  Eyes:     Conjunctiva/sclera: Conjunctivae normal.  Cardiovascular:     Rate and Rhythm: Normal rate and regular rhythm.  Pulmonary:     Effort: Pulmonary effort is normal. No respiratory distress.     Breath sounds: Normal breath sounds.  Abdominal:     General: Abdomen is flat.     Palpations: Abdomen is soft.     Tenderness: There is no abdominal tenderness.  Musculoskeletal:     Right lower leg: No edema.     Left lower leg: Edema present.  Skin:    General: Skin is warm and dry.     Capillary Refill: Capillary refill takes less than 2 seconds.  Comments: Approximately 1 x 1 cm wound to the left medial ankle with surrounding erythema extending to the dorsal foot and up to the mid shin, 2+ pitting edema to the left lower extremity  Neurological:     Mental Status: He is alert and oriented to person, place, and time. Mental status is at baseline.  Psychiatric:        Mood and Affect: Mood normal.        Behavior: Behavior normal.     ED Results and Treatments Labs (all labs ordered are listed, but only abnormal results are displayed) Labs Reviewed  BASIC METABOLIC PANEL WITH GFR - Abnormal; Notable for the following components:      Result Value   CO2 37 (*)    Glucose, Bld 285 (*)    BUN 5 (*)    Anion gap 1 (*)    All other components within normal limits  CBC WITH DIFFERENTIAL/PLATELET                                                                                                                          Radiology No results found.  Pertinent labs & imaging results that were available during my care of the patient were reviewed by me and considered in my medical decision making (see MDM for  details).  Medications Ordered in ED Medications  vancomycin  (VANCOCIN ) IVPB 1000 mg/200 mL premix (1,000 mg Intravenous New Bag/Given 12/16/23 1022)    Followed by  vancomycin  (VANCOCIN ) IVPB 1000 mg/200 mL premix (has no administration in time range)  sodium chloride  0.9 % bolus 1,000 mL (1,000 mLs Intravenous New Bag/Given 12/16/23 0941)  oxyCODONE  (Oxy IR/ROXICODONE ) immediate release tablet 5 mg (5 mg Oral Given 12/16/23 0944)  acetaminophen  (TYLENOL ) tablet 1,000 mg (1,000 mg Oral Given 12/16/23 0944)  cefTRIAXone  (ROCEPHIN ) 1 g in sodium chloride  0.9 % 100 mL IVPB (0 g Intravenous Stopped 12/16/23 1018)                                                                                                                                     Procedures Procedures  (including critical care time)  Medical Decision Making / ED Course   MDM:  68 year old presenting to the emergency department with wound skin infection.  Patient has been compliant with doxycycline  however symptoms have been progressive.  Culture grew group A strep.  Symptoms most consistent  with cellulitis but does have some purulent drainage as well, but no obvious abscess.  Very low concern for septic joint, patient can range his ankle okay.  Patient has been on 4 days of oral antibiotics without any improvement, also diabetic which makes him a high risk for progression.  Could partially be explained by doxycycline  not being optimal treatment however would expect some improvement.  Will check labs, give ceftriaxone  and vancomycin , probably will need observation in hospital for improvement.  Clinical Course as of 12/16/23 1100  Tue Dec 16, 2023  1059 Patient admitted to hospitalist service.  [WS]    Clinical Course User Index [WS] Mordecai Applebaum, MD     Additional history obtained: -External records from outside source obtained and reviewed including: Chart review including previous notes, labs, imaging, consultation  notes including prior notes    Lab Tests: -I ordered, reviewed, and interpreted labs.   The pertinent results include:   Labs Reviewed  BASIC METABOLIC PANEL WITH GFR - Abnormal; Notable for the following components:      Result Value   CO2 37 (*)    Glucose, Bld 285 (*)    BUN 5 (*)    Anion gap 1 (*)    All other components within normal limits  CBC WITH DIFFERENTIAL/PLATELET    Notable for false elevation in CO2 (lab machine error , happening for every patient today)   Medicines ordered and prescription drug management: Meds ordered this encounter  Medications   sodium chloride  0.9 % bolus 1,000 mL   oxyCODONE  (Oxy IR/ROXICODONE ) immediate release tablet 5 mg    Refill:  0   acetaminophen  (TYLENOL ) tablet 1,000 mg   cefTRIAXone  (ROCEPHIN ) 1 g in sodium chloride  0.9 % 100 mL IVPB    Antibiotic Indication::   Cellulitis   DISCONTD: vancomycin  (VANCOREADY) IVPB 2000 mg/400 mL    Indication::   Cellulitis   FOLLOWED BY Linked Order Group    vancomycin  (VANCOCIN ) IVPB 1000 mg/200 mL premix     Indication::   Wound Infection    vancomycin  (VANCOCIN ) IVPB 1000 mg/200 mL premix     Indication::   Wound Infection    -I have reviewed the patients home medicines and have made adjustments as needed   Social Determinants of Health:  Diagnosis or treatment significantly limited by social determinants of health: obesity   Reevaluation: After the interventions noted above, I reevaluated the patient and found that their symptoms have improved  Co morbidities that complicate the patient evaluation  Past Medical History:  Diagnosis Date   Anxiety    Arthritis    Basal cell carcinoma (BCC) in situ of skin    left neck   Chronic back pain    has spinal cord stimulator and wears buprenorphine  patch   Colon polyps    Coronary artery disease    S/p DES to mRCA in 12/21 // S/p DES to LCx x 2 in 12/2020 // Diff dz in small to mod Dx (not a good target for PCI); OM1 100 CTO; severe  dz in small OM2 and PDA >> Med Rx   Depression    Diabetes mellitus without complication (HCC)    type 2   Fibromyalgia    Gallstones    GERD (gastroesophageal reflux disease)    Hepatitis B    HLD (hyperlipidemia)    Hypertension    Osteoarthritis    Pneumonia    PONV (postoperative nausea and vomiting)    SBO (small bowel obstruction) (  HCC)    Sleep apnea    does not use CPAP      Dispostion: Disposition decision including need for hospitalization was considered, and patient admitted to the hospital.    Final Clinical Impression(s) / ED Diagnoses Final diagnoses:  Cellulitis of left lower extremity     This chart was dictated using voice recognition software.  Despite best efforts to proofread,  errors can occur which can change the documentation meaning.    Mordecai Applebaum, MD 12/16/23 1100

## 2023-12-17 DIAGNOSIS — L03116 Cellulitis of left lower limb: Secondary | ICD-10-CM | POA: Diagnosis not present

## 2023-12-17 LAB — COMPREHENSIVE METABOLIC PANEL WITH GFR
ALT: 14 U/L (ref 0–44)
AST: 23 U/L (ref 15–41)
Albumin: 3.4 g/dL — ABNORMAL LOW (ref 3.5–5.0)
Alkaline Phosphatase: 66 U/L (ref 38–126)
Anion gap: 11 (ref 5–15)
BUN: 11 mg/dL (ref 8–23)
CO2: 24 mmol/L (ref 22–32)
Calcium: 8.5 mg/dL — ABNORMAL LOW (ref 8.9–10.3)
Chloride: 104 mmol/L (ref 98–111)
Creatinine, Ser: 0.75 mg/dL (ref 0.61–1.24)
GFR, Estimated: >60 mL/min (ref >60)
Glucose, Bld: 202 mg/dL — ABNORMAL HIGH (ref 70–99)
Potassium: 3.6 mmol/L (ref 3.5–5.1)
Sodium: 139 mmol/L (ref 135–145)
Total Bilirubin: 1.2 mg/dL (ref 0.0–1.2)
Total Protein: 6.8 g/dL (ref 6.5–8.1)

## 2023-12-17 LAB — CBC
HCT: 43.8 % (ref 39.0–52.0)
Hemoglobin: 14.9 g/dL (ref 13.0–17.0)
MCH: 32.7 pg (ref 26.0–34.0)
MCHC: 34 g/dL (ref 30.0–36.0)
MCV: 96.1 fL (ref 80.0–100.0)
Platelets: 185 10*3/uL (ref 150–400)
RBC: 4.56 MIL/uL (ref 4.22–5.81)
RDW: 12.8 % (ref 11.5–15.5)
WBC: 5.4 10*3/uL (ref 4.0–10.5)
nRBC: 0 % (ref 0.0–0.2)

## 2023-12-17 LAB — GLUCOSE, CAPILLARY
Glucose-Capillary: 204 mg/dL — ABNORMAL HIGH (ref 70–99)
Glucose-Capillary: 232 mg/dL — ABNORMAL HIGH (ref 70–99)
Glucose-Capillary: 155 mg/dL — ABNORMAL HIGH (ref 70–99)
Glucose-Capillary: 182 mg/dL — ABNORMAL HIGH (ref 70–99)

## 2023-12-17 LAB — HIV ANTIBODY (ROUTINE TESTING W REFLEX): HIV Screen 4th Generation wRfx: NONREACTIVE

## 2023-12-17 MED ORDER — MORPHINE SULFATE (PF) 2 MG/ML IV SOLN
2.0000 mg | Freq: Once | INTRAVENOUS | Status: AC
  Administered 2023-12-17: 2 mg via INTRAVENOUS
  Filled 2023-12-17: qty 1

## 2023-12-17 MED ORDER — ENOXAPARIN SODIUM 40 MG/0.4ML IJ SOSY
40.0000 mg | PREFILLED_SYRINGE | INTRAMUSCULAR | Status: DC
  Administered 2023-12-17: 40 mg via SUBCUTANEOUS
  Filled 2023-12-17: qty 0.4

## 2023-12-17 MED ORDER — LIVING WELL WITH DIABETES BOOK
Freq: Once | Status: AC
  Filled 2023-12-17: qty 1

## 2023-12-17 NOTE — Plan of Care (Signed)

## 2023-12-17 NOTE — Inpatient Diabetes Management (Addendum)
 Inpatient Diabetes Program Recommendations  AACE/ADA: New Consensus Statement on Inpatient Glycemic Control (2015)  Target Ranges:  Prepandial:   less than 140 mg/dL      Peak postprandial:   less than 180 mg/dL (1-2 hours)      Critically ill patients:  140 - 180 mg/dL   Lab Results  Component Value Date   GLUCAP 204 (H) 12/17/2023   HGBA1C 11.6 (H) 12/16/2023    Review of Glycemic Control  Latest Reference Range & Units 12/16/23 13:44 12/16/23 16:18 12/16/23 21:28 12/17/23 07:17  Glucose-Capillary 70 - 99 mg/dL 161 (H) 096 (H) 045 (H) 204 (H)  (H): Data is abnormally high  Diabetes history: DM2 Outpatient Diabetes medications:  Tandem Insulin  pump with the Dexcom G7 -Basal rates: 12 am: 0.5 units/h - Insulin  to carb ratio: 12 am: 1:20 - Target: 12 am: 1:120 - Correction factor (insulin  sensitivity factor):  12 am: 100 - Active insulin  time: 5h  Current orders for Inpatient glycemic control: Novolog  0-15 units TID and Jardiance  25 mg QD  Inpatient Diabetes Program Recommendations:    Please consider: Semglee  8 units every day Novolog  0-9 units TID and 0-5 units QHS  Spoke with patient this morning.  His insulin  pump malfunctioned 2 days ago and his new pump has been delivered to his home.  He see's Dr. Aldona Amel for endocrinology.  Last visit was in September.  He has an appointment scheduled with her in the near future (cannot remember exact date).  Reviewed patient's current A1c of 11.6%. Explained what a A1c is and what it measures. Also reviewed goal A1c with patient, importance of good glucose control @ home, and blood sugar goals. He was recently divorced and had not been making good choices with beverages and food.  In the last month he has switched back to non-caloric beverages and is mindful of portion sizes and CHO's.  He has lost 10 lbs.  Explained importance of glucose control for healing.  He verbalizes understanding.  Ordered the [facility] booklet.    Will continue  to follow while inpatient.  Thank you, Hays Lipschutz, MSN, CDCES Diabetes Coordinator Inpatient Diabetes Program [phone] (team pager from 8a-5p)

## 2023-12-17 NOTE — Progress Notes (Signed)
 PROGRESS NOTE    STEELE [name]  VHQ:[id number] DOB: Nov 10, 1955 DOA: 12/16/2023 PCP: Brandon Holter, DO   Brief Narrative: Brandon [name] is a 68 y.o. male with a history of anxiety, osteoarthritis, basal cell carcinoma, chronic back pain, CAD, depression, diabetes mellitus type 2, fibromyalgia, GERD, hepatitis B, hyperlipidemia, hypertension.  Patient presented secondary to left leg cellulitis with wound and was found to have evidence of worsening left lower extremity cellulitis.  Vancomycin  and cefepime started on admission.   Assessment and Plan:  Left lower leg cellulitis Failure of outpatient management.  Presumed secondary to arthropod bite.  Patient started on vancomycin  and cefepime on admission.  Afebrile with no leukocytosis.  Mild drainage. Continue vancomycin  and cefepime for now  Primary hypertension Noted.  Patient is not on antihypertensives.  Hypokalemia Potassium supplementation given on admission.  Anxiety Depression - Continue bupropion  300 mg daily  BPH -Continue tamsulosin  daily  GERD - Continue Protonix   Hyperlipidemia - Continue atorvastatin   Obstructive sleep apnea Noted.  Patient is not on CPAP.    DVT prophylaxis: Lovenox  Code Status:   Code Status: Full Code Family Communication: None at bedside Disposition Plan: Discharge home likely in 2 to 3 days pending ability to transition to outpatient antibiotics   Consultants:  None  Procedures:  None  Antimicrobials: Vancomycin  Cefepime   Subjective: Patient with left leg pain especially when applying weight.  Mild drainage from wound.  Objective: BP 129/62 (BP Location: Right Arm)   Pulse 78   Temp 98 F (36.7 C) (Oral)   Resp 15   Ht 5\' 8"  (1.727 m)   Wt 88.5 kg   SpO2 95%   BMI 29.65 kg/m   Examination:  General exam: Appears calm and comfortable Respiratory system: Clear to auscultation. Respiratory effort normal. Cardiovascular system: S1 & S2 heard, RRR. No  murmurs, rubs, gallops or clicks. Gastrointestinal system: Abdomen is nondistended, soft and nontender. Normal bowel sounds heard. Central nervous system: Alert and oriented. No focal neurological deficits. Musculoskeletal: Medial ankle with overlying erythema but no joint effusion noted.  Psychiatry: Judgement and insight appear normal. Mood & affect appropriate.    Data Reviewed: I have personally reviewed following labs and imaging studies  CBC Lab Results  Component Value Date   WBC 5.4 12/17/2023   RBC 4.56 12/17/2023   HGB 14.9 12/17/2023   HCT 43.8 12/17/2023   MCV 96.1 12/17/2023   MCH 32.7 12/17/2023   PLT 185 12/17/2023   MCHC 34.0 12/17/2023   RDW 12.8 12/17/2023   LYMPHSABS 0.9 12/16/2023   MONOABS 0.6 12/16/2023   EOSABS 0.0 12/16/2023   BASOSABS 0.0 12/16/2023     Last metabolic panel Lab Results  Component Value Date   NA 139 12/17/2023   K 3.6 12/17/2023   CL 104 12/17/2023   CO2 24 12/17/2023   BUN 11 12/17/2023   CREATININE 0.75 12/17/2023   GLUCOSE 202 (H) 12/17/2023   GFRNONAA >60 12/17/2023   GFRAA >60 04/17/2020   CALCIUM  8.5 (L) 12/17/2023   PHOS 3.2 07/13/2022   PROT 6.8 12/17/2023   ALBUMIN 3.4 (L) 12/17/2023   LABGLOB 2.8 12/14/2023   BILITOT 1.2 12/17/2023   ALKPHOS 66 12/17/2023   AST 23 12/17/2023   ALT 14 12/17/2023   ANIONGAP 11 12/17/2023    GFR: Estimated Creatinine Clearance: 95.5 mL/min (by C-G formula based on SCr of 0.75 mg/dL).  Recent Results (from the past 240 hours)  Aerobic Culture w Gram Stain (superficial specimen)  Status: None   Collection Time: 12/14/23 10:35 AM   Specimen: Wound  Result Value Ref Range Status   Specimen Description WOUND  Final   Special Requests NONE  Final   Gram Stain   Final    RARE WBC PRESENT, PREDOMINANTLY PMN MODERATE GRAM POSITIVE COCCI    Culture   Final    ABUNDANT GROUP A STREP (S.PYOGENES) ISOLATED Beta hemolytic streptococci are predictably susceptible to penicillin  and other beta lactams. Susceptibility testing not routinely performed. Performed at [facility], [address] [place], [place] [address]    Report Status 12/16/2023 FINAL  Final      Radiology Studies: No results found.    LOS: 1 day    Aneita Keens, MD [facility] 12/17/2023, 5:16 PM   If 7PM-7AM, please contact night-coverage [email or web address]

## 2023-12-17 NOTE — Progress Notes (Signed)
 Mobility Specialist - Progress Note   12/17/23 1120  Mobility  Activity Ambulated with assistance in hallway  Level of Assistance Modified independent, requires aide device or extra time  Assistive Device Front wheel walker  Distance Ambulated (ft) 120 ft  Activity Response Tolerated well  Mobility Referral Yes  Mobility visit 1 Mobility  Mobility Specialist Start Time (ACUTE ONLY) 1109  Mobility Specialist Stop Time (ACUTE ONLY) 1119  Mobility Specialist Time Calculation (min) (ACUTE ONLY) 10 min   Pt received in bed and agreeable to mobility. No complaints during session. Pt to bed after session with all needs met.    [facility]

## 2023-12-17 NOTE — Hospital Course (Signed)
 Brandon [name] is a 68 y.o. male with a history of anxiety, osteoarthritis, basal cell carcinoma, chronic back pain, CAD, depression, diabetes mellitus type 2, fibromyalgia, GERD, hepatitis B, hyperlipidemia, hypertension.  Patient presented secondary to left leg cellulitis with wound and was found to have evidence of worsening left lower extremity cellulitis.  Vancomycin  and ceftriaxone  started on admission with improvement of erythema. Discharge on linezolid  and ceftriaxone .

## 2023-12-18 ENCOUNTER — Other Ambulatory Visit (HOSPITAL_BASED_OUTPATIENT_CLINIC_OR_DEPARTMENT_OTHER): Payer: Self-pay

## 2023-12-18 ENCOUNTER — Other Ambulatory Visit: Payer: Self-pay

## 2023-12-18 ENCOUNTER — Other Ambulatory Visit (HOSPITAL_COMMUNITY): Payer: Self-pay

## 2023-12-18 ENCOUNTER — Telehealth (HOSPITAL_COMMUNITY): Payer: Self-pay | Admitting: Pharmacy Technician

## 2023-12-18 DIAGNOSIS — L03116 Cellulitis of left lower limb: Secondary | ICD-10-CM | POA: Diagnosis not present

## 2023-12-18 LAB — GLUCOSE, CAPILLARY
Glucose-Capillary: 164 mg/dL — ABNORMAL HIGH (ref 70–99)
Glucose-Capillary: 186 mg/dL — ABNORMAL HIGH (ref 70–99)

## 2023-12-18 MED ORDER — LINEZOLID 600 MG PO TABS
600.0000 mg | ORAL_TABLET | Freq: Two times a day (BID) | ORAL | 0 refills | Status: AC
  Filled 2023-12-18: qty 9, 5d supply, fill #0

## 2023-12-18 MED ORDER — BUPROPION HCL ER (XL) 300 MG PO TB24
300.0000 mg | ORAL_TABLET | Freq: Every day | ORAL | Status: DC
  Administered 2023-12-18: 300 mg via ORAL
  Filled 2023-12-18: qty 1

## 2023-12-18 MED ORDER — BUPROPION HCL ER (XL) 150 MG PO TB24
150.0000 mg | ORAL_TABLET | Freq: Every day | ORAL | Status: DC
  Administered 2023-12-18: 150 mg via ORAL
  Filled 2023-12-18: qty 1

## 2023-12-18 MED ORDER — HYDROCODONE-ACETAMINOPHEN 5-325 MG PO TABS
1.0000 | ORAL_TABLET | Freq: Two times a day (BID) | ORAL | 0 refills | Status: AC | PRN
Start: 2023-12-18 — End: 2023-12-21
  Filled 2023-12-18: qty 6, 3d supply, fill #0

## 2023-12-18 MED ORDER — LINEZOLID 600 MG PO TABS
600.0000 mg | ORAL_TABLET | Freq: Two times a day (BID) | ORAL | Status: DC
  Administered 2023-12-18: 600 mg via ORAL
  Filled 2023-12-18: qty 1

## 2023-12-18 MED ORDER — CEFADROXIL 500 MG PO CAPS
1000.0000 mg | ORAL_CAPSULE | Freq: Two times a day (BID) | ORAL | Status: DC
Start: 2023-12-18 — End: 2023-12-18
  Administered 2023-12-18: 1000 mg via ORAL
  Filled 2023-12-18: qty 2

## 2023-12-18 MED ORDER — CEFADROXIL 500 MG PO CAPS
1000.0000 mg | ORAL_CAPSULE | Freq: Two times a day (BID) | ORAL | 0 refills | Status: AC
  Filled 2023-12-18: qty 20, 5d supply, fill #0

## 2023-12-18 NOTE — Discharge Instructions (Addendum)
 Brandon [name],  You are in the hospital with a left leg cellulitis.  This is improved with a change in antibiotics.  Please continue antibiotics on discharge.  Please follow-up with your primary care doctor. Please hold your Wellbutrin  as instructed.

## 2023-12-18 NOTE — Progress Notes (Signed)
 Mobility Specialist - Progress Note   12/18/23 1001  Mobility  Activity Ambulated with assistance in hallway  Level of Assistance Modified independent, requires aide device or extra time  Assistive Device Front wheel walker  Distance Ambulated (ft) 250 ft  Activity Response Tolerated well  Mobility Referral Yes  Mobility visit 1 Mobility  Mobility Specialist Start Time (ACUTE ONLY) 0950  Mobility Specialist Stop Time (ACUTE ONLY) 1000  Mobility Specialist Time Calculation (min) (ACUTE ONLY) 10 min   Pt received in bed and agreeable to mobility. No complaints during session. Pt to bed after session with all needs met.    [facility]

## 2023-12-18 NOTE — Telephone Encounter (Signed)
 Patient Product/process development scientist completed.    The patient is insured through [facility]. Patient has [employer], may use a copay card, and/or apply for patient assistance if available.    Ran test claim for linezolid  600 mg and the current 10 day co-pay is $5.00.   This test claim was processed through  [facility]- copay amounts may vary at other pharmacies due to pharmacy/plan contracts, or as the patient moves through the different stages of their insurance plan.     Morgan Arab, CPHT Pharmacy Technician III Certified Patient Advocate [facility] Patient Advocate Team Direct Number: [phone]  Fax: [phone]

## 2023-12-18 NOTE — Plan of Care (Signed)
  Problem: Education: Goal: Knowledge of General Education information will improve Description: Including pain rating scale, medication(s)/side effects and non-pharmacologic comfort measures Outcome: Completed/Met   Problem: Health Behavior/Discharge Planning: Goal: Ability to manage health-related needs will improve Outcome: Completed/Met   Problem: Clinical Measurements: Goal: Ability to maintain clinical measurements within normal limits will improve Outcome: Completed/Met Goal: Will remain free from infection Outcome: Completed/Met Goal: Diagnostic test results will improve Outcome: Completed/Met Goal: Respiratory complications will improve Outcome: Completed/Met Goal: Cardiovascular complication will be avoided Outcome: Completed/Met   Problem: Activity: Goal: Risk for activity intolerance will decrease Outcome: Completed/Met   Problem: Nutrition: Goal: Adequate nutrition will be maintained Outcome: Completed/Met   Problem: Coping: Goal: Level of anxiety will decrease Outcome: Completed/Met   Problem: Elimination: Goal: Will not experience complications related to bowel motility Outcome: Completed/Met Goal: Will not experience complications related to urinary retention Outcome: Completed/Met   Problem: Pain Managment: Goal: General experience of comfort will improve and/or be controlled Outcome: Completed/Met   Problem: Safety: Goal: Ability to remain free from injury will improve Outcome: Completed/Met   Problem: Skin Integrity: Goal: Risk for impaired skin integrity will decrease Outcome: Completed/Met   Problem: Clinical Measurements: Goal: Ability to avoid or minimize complications of infection will improve Outcome: Completed/Met   Problem: Skin Integrity: Goal: Skin integrity will improve Outcome: Completed/Met   Problem: Education: Goal: Ability to describe self-care measures that may prevent or decrease complications (Diabetes Survival Skills  Education) will improve Outcome: Completed/Met Goal: Individualized Educational Video(s) Outcome: Completed/Met   Problem: Coping: Goal: Ability to adjust to condition or change in health will improve Outcome: Completed/Met   Problem: Fluid Volume: Goal: Ability to maintain a balanced intake and output will improve Outcome: Completed/Met   Problem: Health Behavior/Discharge Planning: Goal: Ability to identify and utilize available resources and services will improve Outcome: Completed/Met Goal: Ability to manage health-related needs will improve Outcome: Completed/Met   Problem: Metabolic: Goal: Ability to maintain appropriate glucose levels will improve Outcome: Completed/Met   Problem: Nutritional: Goal: Maintenance of adequate nutrition will improve Outcome: Completed/Met Goal: Progress toward achieving an optimal weight will improve Outcome: Completed/Met   Problem: Skin Integrity: Goal: Risk for impaired skin integrity will decrease Outcome: Completed/Met   Problem: Tissue Perfusion: Goal: Adequacy of tissue perfusion will improve Outcome: Completed/Met

## 2023-12-18 NOTE — Plan of Care (Signed)
   Problem: Education: Goal: Knowledge of General Education information will improve Description: Including pain rating scale, medication(s)/side effects and non-pharmacologic comfort measures Outcome: Progressing   Problem: Health Behavior/Discharge Planning: Goal: Ability to manage health-related needs will improve Outcome: Progressing   Problem: Clinical Measurements: Goal: Will remain free from infection Outcome: Progressing

## 2023-12-18 NOTE — Discharge Summary (Signed)
 Physician Discharge Summary   Patient: Brandon [name] MRN: [id number] DOB: [birth year]  Admit date:     12/16/2023  Discharge date: 12/18/23  Discharge Physician: Aneita Keens, MD   PCP: Adela Holter, DO   Recommendations at discharge:  PCP visit for hospital follow-up  Discharge Diagnoses: Principal Problem:   Cellulitis of left lower extremity Active Problems:   Hypokalemia   Primary hypertension   Coronary artery disease   Anxiety and depression   BPH (benign prostatic hyperplasia)   GERD (gastroesophageal reflux disease)   Mixed hyperlipidemia   Obstructive sleep apnea  Resolved Problems:   * No resolved hospital problems. *  Hospital Course: [name] is a 68 y.o. male with a history of anxiety, osteoarthritis, basal cell carcinoma, chronic back pain, CAD, depression, diabetes mellitus type 2, fibromyalgia, GERD, hepatitis B, hyperlipidemia, hypertension.  Patient presented secondary to left leg cellulitis with wound and was found to have evidence of worsening left lower extremity cellulitis.  Vancomycin  and ceftriaxone  started on admission with improvement of erythema. Discharge on linezolid  and ceftriaxone .  Assessment and Plan:  Left lower leg cellulitis Failure of outpatient management.  Presumed secondary to arthropod bite.  Patient started on vancomycin  and Ceftriaxone  on admission.  Afebrile with no leukocytosis.  Mild drainage. Antibiotics transitioned to Linezolid  and Cefadroxil  on discharge. Continue to complete a 7-day course of antibiotics.   Primary hypertension Noted.  Patient is not on antihypertensives.   Hypokalemia Potassium supplementation given on admission.   Anxiety Depression Hold bupropion  until Linezolid  course is completed; discussed with patient.   BPH Continue tamsulosin  daily.   GERD Continue Protonix .   Hyperlipidemia Continue atorvastatin .   Obstructive sleep apnea Noted.  Patient is not on CPAP.   Consultants:  None Procedures performed: None  Disposition: Home Diet recommendation: Cardiac diet   DISCHARGE MEDICATION: Allergies as of 12/18/2023       Reactions   Kiwi Extract Swelling, Other (See Comments)   Mouth swelling   Other Swelling, Other (See Comments)   EGGPLANT = Mouth swelling   Penicillins Rash   Ancef [cefazolin] Rash   Latex Rash   Levaquin [levofloxacin] Rash   Peach [prunus Persica] Other (See Comments)   These burn the mouth        Medication List     PAUSE taking these medications    buPROPion  150 MG 24 hr tablet Wait to take this until: December 24, 2023 Commonly known as: Wellbutrin  XL Take 1 tablet (150 mg total) by mouth daily in addition to 300mg  tablet.   buPROPion  300 MG 24 hr tablet Wait to take this until: December 24, 2023 Commonly known as: WELLBUTRIN  XL Take 1 tablet (300 mg total) by mouth daily.       STOP taking these medications    doxycycline  100 MG capsule Commonly known as: VIBRAMYCIN        TAKE these medications    acetaminophen  500 MG tablet Commonly known as: TYLENOL  Take 500-1,000 mg by mouth every 6 (six) hours as needed (for pain or headaches).   albuterol  108 (90 Base) MCG/ACT inhaler Commonly known as: VENTOLIN  HFA Inhale 2 puffs into the lungs every 6 (six) hours as needed for wheezing or shortness of breath (OR WHEN SICK).   AMBULATORY NON FORMULARY MEDICATION Please provide home oxygen  for nocturnal use.  Apply 2-4L of O2 via nasal cannula at bedtime.  Dx: Nocturnal Oxygen  Desaturation G47.34   AMBULATORY NON FORMULARY MEDICATION Please provide humidifier for home oxygen  due to  nasal irritation with use.   ammonium lactate  12 % cream Commonly known as: AMLACTIN Apply to the affected area(s) topically as needed for dry skin.   ARIPiprazole  10 MG tablet Commonly known as: ABILIFY  Take 1 tablet (10 mg total) by mouth every morning.   atorvastatin  80 MG tablet Commonly known as: LIPITOR  Take 1 tablet (80 mg  total) by mouth daily.   b complex vitamins capsule Take 1 capsule by mouth in the morning.   cefadroxil  500 MG capsule Commonly known as: DURICEF Take 2 capsules (1,000 mg total) by mouth 2 (two) times daily for 5 days.   clopidogrel  75 MG tablet Commonly known as: PLAVIX  Take 1 tablet (75 mg total) by mouth daily with breakfast.   Dexcom G7 Sensor Misc Inject 1 Device into the skin See admin instructions. Place 1 new sensor into the skin every 10 days What changed: Another medication with the same name was removed. Continue taking this medication, and follow the directions you see here.   docusate sodium  100 MG capsule Commonly known as: COLACE Take 100 mg by mouth 2 (two) times daily as needed for mild constipation.   FeroSul 325 (65 Fe) MG tablet Generic drug: ferrous sulfate  Take 1 tablet (325 mg) by mouth daily.   Gvoke HypoPen  1-Pack 1 MG/0.2ML Soaj Generic drug: Glucagon  Use as needed for hypoglycemia   HYDROcodone -acetaminophen  5-325 MG tablet Commonly known as: NORCO/VICODIN Take 1 tablet by mouth 2 (two) times daily as needed for up to 3 days. What changed: how much to take   insulin  lispro 100 UNIT/ML injection Commonly known as: HumaLOG  For use in pump, total of 60 units per day   Jardiance  25 MG Tabs tablet Generic drug: empagliflozin  Take 1 tablet (25 mg total) by mouth daily.   linezolid  600 MG tablet Commonly known as: ZYVOX  Take 1 tablet (600 mg total) by mouth every 12 (twelve) hours for 5 days.   magic mouthwash (nystatin , lidocaine , diphenhydrAMINE, alum & mag hydroxide) suspension Swish and spit 5 mLs 3 (three) times daily as needed for mouth pain.   magnesium  oxide 400 MG tablet Commonly known as: MAG-OX Take 2 tablets (800 mg total) by mouth daily.   metoCLOPramide  10 MG tablet Commonly known as: REGLAN  Take 1 tablet (10 mg total) by mouth daily as needed for nausea or vomiting (upset stomach).   mupirocin  ointment 2 % Commonly known  as: BACTROBAN  Apply 1 Application topically 2 (two) times daily. What changed:  when to take this additional instructions   nitroGLYCERIN  0.4 MG SL tablet Commonly known as: NITROSTAT  Place 0.4 mg under the tongue every 5 (five) minutes x 3 doses as needed for chest pain.   ondansetron  4 MG disintegrating tablet Commonly known as: ZOFRAN -ODT Take 1 tablet (4 mg total) by mouth every 8 (eight) hours as needed for nausea or vomiting. What changed: reasons to take this   OXYGEN  Inhale 3 L/min into the lungs at bedtime.   pantoprazole  40 MG tablet Commonly known as: PROTONIX  Take 1 tablet (40 mg total) by mouth daily. What changed: when to take this   polyethylene glycol powder 17 GM/SCOOP powder Commonly known as: GLYCOLAX /MIRALAX  Take 17 g by mouth daily as needed for mild constipation (mix as directed).   tamsulosin  0.4 MG Caps capsule Commonly known as: FLOMAX  Take 1 capsule (0.4 mg total) by mouth daily.   testosterone  50 MG/5GM (1%) Gel Commonly known as: ANDROGEL  Place 5 g (1 packet) onto the shoulders and upper arms daily  as directed.   Therems Tabs Take 1 tablet by mouth daily.   tiZANidine  4 MG tablet Commonly known as: ZANAFLEX  Take 1-1&1/2 tablets (4-6 mg total) by mouth every 8 (eight) hours as needed for muscle spasms.        Discharge Exam: BP 138/76 (BP Location: Right Arm)   Pulse 77   Temp 98.5 F (36.9 C) (Oral)   Resp 20   Ht 5\' 8"  (1.727 m)   Wt 88.5 kg   SpO2 93%   BMI 29.65 kg/m   General exam: Appears calm and comfortable Respiratory system: Clear to auscultation. Respiratory effort normal. Cardiovascular system: S1 & S2 heard, RRR. No murmurs. Gastrointestinal system: Abdomen is nondistended, soft and nontender. Normal bowel sounds heard. Central nervous system: Alert and oriented. Skin: left medial ankle with improved erythema Psychiatry: Judgement and insight appear normal. Mood & affect appropriate.   Condition at discharge:  stable  The results of significant diagnostics from this hospitalization (including imaging, microbiology, ancillary and laboratory) are listed below for reference.   Imaging Studies: No results found.  Microbiology: Results for orders placed or performed during the hospital encounter of 12/14/23  Aerobic Culture w Gram Stain (superficial specimen)     Status: None   Collection Time: 12/14/23 10:35 AM   Specimen: Wound  Result Value Ref Range Status   Specimen Description WOUND  Final   Special Requests NONE  Final   Gram Stain   Final    RARE WBC PRESENT, PREDOMINANTLY PMN MODERATE GRAM POSITIVE COCCI    Culture   Final    ABUNDANT GROUP A STREP (S.PYOGENES) ISOLATED Beta hemolytic streptococci are predictably susceptible to penicillin and other beta lactams. Susceptibility testing not routinely performed. Performed at [facility], [name] [place], [place] [address]    Report Status 12/16/2023 FINAL  Final   *Note: Due to a large number of results and/or encounters for the requested time period, some results have not been displayed. A complete set of results can be found in Results Review.    Labs: CBC: Recent Labs  Lab 12/14/23 1042 12/16/23 0928 12/17/23 0622  WBC 6.5 6.3 5.4  NEUTROABS 4.7 4.7  --   HGB 16.7 15.6 14.9  HCT 49.5 44.1 43.8  MCV 97 93.6 96.1  PLT 214 187 185   Basic Metabolic Panel: Recent Labs  Lab 12/14/23 1042 12/16/23 0928 12/17/23 0622  NA 141 141 139  K 3.8 3.7 3.6  CL 101 103 104  CO2 17* 37* 24  GLUCOSE 278* 285* 202*  BUN 7* 5* 11  CREATININE 0.83 0.74 0.75  CALCIUM  9.3 9.1 8.5*   Liver Function Tests: Recent Labs  Lab 12/14/23 1042 12/17/23 0622  AST 22 23  ALT 14 14  ALKPHOS 100 66  BILITOT 0.5 1.2  PROT 7.3 6.8  ALBUMIN 4.5 3.4*   CBG: Recent Labs  Lab 12/17/23 0717 12/17/23 1210 12/17/23 1602 12/17/23 2146 12/18/23 0709  GLUCAP 204* 232* 155* 182* 164*    Discharge time spent: 35  minutes.  Signed: Aneita Keens, MD [facility] 12/18/2023

## 2023-12-19 ENCOUNTER — Other Ambulatory Visit (HOSPITAL_BASED_OUTPATIENT_CLINIC_OR_DEPARTMENT_OTHER): Payer: Self-pay

## 2023-12-19 ENCOUNTER — Telehealth: Payer: Self-pay

## 2023-12-19 ENCOUNTER — Ambulatory Visit (INDEPENDENT_AMBULATORY_CARE_PROVIDER_SITE_OTHER): Admitting: Physician Assistant

## 2023-12-19 ENCOUNTER — Encounter: Payer: Self-pay | Admitting: Physician Assistant

## 2023-12-19 VITALS — BP 125/75 | HR 62 | Temp 98.1°F | Ht 68.0 in | Wt 197.0 lb

## 2023-12-19 DIAGNOSIS — L03116 Cellulitis of left lower limb: Secondary | ICD-10-CM

## 2023-12-19 DIAGNOSIS — Z09 Encounter for follow-up examination after completed treatment for conditions other than malignant neoplasm: Secondary | ICD-10-CM | POA: Diagnosis not present

## 2023-12-19 NOTE — Progress Notes (Signed)
   Established Patient Office Visit  Subjective   Patient ID: Brandon [name], male    DOB: 30-Jan-1956  Age: 68 y.o. MRN: [id number]  Chief Complaint  Patient presents with   Hospitalization Follow-up    Cellulitis of left lower extremity    HPI Pt is a 68 yo male who presents to the clinic to follow up after admission to hospital for left lower ext cellulitis after spider bite on 12/16/2023 and discharged on 12/18/2023.   He was seen on 12/13/2023 in UC and started on doxycycline .   He returned to UC on 12/14/2023 and doxycycline  was continued but given bactroban  for topical use and hydrocodone  for pain. WBC was 6.5.   He arrived at ED on 4/22 because wound was not getting better. He was started on vancomycin  and Ceftriazone. He was given linezolid  and cefriaxone to continue for 7 days at home after discharge on 12/18/23.he was given 6 tablets of oxycodone .  He keeps wound covered and washes with VASHE nightly and placed a honey ointment on wound. It is getting better and he would like note to go back to work.     ROS See HPI.    Objective:     BP 125/75   Pulse 62   Temp 98.1 F (36.7 C) (Oral)   Ht 5\' 8"  (1.727 m)   Wt 197 lb (89.4 kg)   SpO2 99%   BMI 29.95 kg/m  BP Readings from Last 3 Encounters:  12/19/23 125/75  12/18/23 138/76  12/14/23 (!) 149/80   Wt Readings from Last 3 Encounters:  12/19/23 197 lb (89.4 kg)  12/16/23 195 lb (88.5 kg)  10/30/23 206 lb (93.4 kg)      Physical Exam HENT:     Head: Normocephalic.  Cardiovascular:     Rate and Rhythm: Normal rate.  Pulmonary:     Effort: Pulmonary effort is normal.  Neurological:     General: No focal deficit present.     Mental Status: He is alert and oriented to person, place, and time.  Psychiatric:        Mood and Affect: Mood normal.       Tenderness, warmth over erythema around wound No active drainage today Edema around left ankle and just above wound   Assessment & Plan:  Aaron AasAaron AasEusevio was  seen today for hospitalization follow-up.  Diagnoses and all orders for this visit:  Hospital discharge follow-up  Cellulitis of left lower extremity   Wound does appear to be healing today(compared to picture patient showed me) and pain improving.  No fever and vitals look great! Wrote a note to go back to work on 4/27 with allowance of foot elevation. Placed xeroform on wound today and wrapped with ace wrap for compression Continue to keep clean and use vashe for cleansing.  Reminded to keep feet elevated FINISH all antibiotics Follow up in 1 week to confirm infection resolved   Sandy Crumb, PA-C

## 2023-12-19 NOTE — Transitions of Care (Post Inpatient/ED Visit) (Signed)
 12/19/2023  Name: Brandon [name] MRN: [id number] DOB: [birth year]  Today's TOC FU Call Status: Today's TOC FU Call Status:: Successful TOC FU Call Completed TOC FU Call Complete Date: 12/19/23 Patient's Name and Date of Birth confirmed.  Transition Care Management Follow-up Telephone Call Date of Discharge: 12/18/23 Discharge Facility: [facility]) Type of Discharge: Inpatient Admission Primary Inpatient Discharge Diagnosis:: cellutitis How have you been since you were released from the hospital?: Better Any questions or concerns?: No  Items Reviewed: Did you receive and understand the discharge instructions provided?: Yes Medications obtained,verified, and reconciled?: Yes (Medications Reviewed) Any new allergies since your discharge?: No Dietary orders reviewed?: Yes Do you have support at home?: No  Medications Reviewed Today: Medications Reviewed Today     Reviewed by Darrall Ellison, LPN (Licensed Practical Nurse) on 12/19/23 at [phone]  Med List Status: <None>   Medication Order Taking? Sig Documenting Provider Last Dose Status Informant  acetaminophen  (TYLENOL ) 500 MG tablet [id number] No Take 500-1,000 mg by mouth every 6 (six) hours as needed (for pain or headaches). [provider] Unknown Active Self  albuterol  (VENTOLIN  HFA) 108 (90 Base) MCG/ACT inhaler [id number] No Inhale 2 puffs into the lungs every 6 (six) hours as needed for wheezing or shortness of breath (OR WHEN SICK). [provider] Unknown Active Self  AMBULATORY NON FORMULARY MEDICATION [id number] No Please provide home oxygen  for nocturnal use.  Apply 2-4L of O2 via nasal cannula at bedtime.  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 Adela Holter, DO Taking Active   ammonium lactate  (AMLACTIN) 12 % cream [id number] No Apply to the  affected area(s) topically as needed for dry skin. Jennefer Moats, DPM Unknown Active Self  ARIPiprazole  (ABILIFY ) 10 MG tablet [id number] No Take 1 tablet (10 mg total) by mouth every morning. Adela Holter, DO 12/15/2023 Morning Active Self  atorvastatin  (LIPITOR ) 80 MG tablet [id number] No Take 1 tablet (80 mg total) by mouth daily. Marlyse Single T, PA-C 12/15/2023 Morning Active Self  b complex vitamins capsule [id number] No Take 1 capsule by mouth in the morning. [provider] 12/15/2023 Morning Active Self  buPROPion  (WELLBUTRIN  XL) 150 MG 24 hr tablet [id number] No Take 1 tablet (150 mg total) by mouth daily in addition to 300mg  tablet.  Patient taking differently: Take 150 mg by mouth See admin instructions. Take 150 mg by mouth in the morning with one 300 mg tablet to equal a total daily dose of 450 mg   Matthews, Cody, DO 12/15/2023 Morning Active Self  buPROPion  (WELLBUTRIN  XL) 300 MG 24 hr tablet [id number] No Take 1 tablet (300 mg total) by mouth daily.  Patient taking differently: Take 300 mg by mouth See admin instructions. Take 300 mg by mouth in the morning in conjunction with one 150 mg tablet to equal a total daily dose of 450 mg   Matthews, Cody, DO 12/15/2023 Morning Active Self  cefadroxil  (DURICEF) 500 MG capsule [id number]  Take 2 capsules (1,000 mg total) by mouth 2 (two) times daily for 5 days. Verlyn Goad, MD  Active   clopidogrel  (PLAVIX ) 75 MG tablet [id number] No Take 1 tablet (75 mg total) by mouth daily with breakfast. Elois Hair, MD 12/15/2023 10:00 AM Active Self  Continuous Glucose Sensor (DEXCOM G7 SENSOR) MISC [id number] No Inject 1 Device into the skin See admin instructions. Place 1  new sensor into the skin every 10 days [provider] 12/14/2023 Active Self  docusate sodium  (COLACE) 100 MG capsule [id number] No Take 100 mg by mouth 2 (two) times daily as needed for mild constipation. [provider] Unknown Active Self  empagliflozin   (JARDIANCE ) 25 MG TABS tablet [id number] No Take 1 tablet (25 mg total) by mouth daily. Adela Holter, DO 12/15/2023 Morning Active Self  GVOKE HYPOPEN  1-PACK 1 MG/0.2ML [place] [id number] No Use as needed for hypoglycemia Emilie Harden, MD Unknown Active Self  HYDROcodone -acetaminophen  (NORCO/VICODIN) 5-325 MG tablet [id number]  Take 1 tablet by mouth 2 (two) times daily as needed for up to 3 days. Verlyn Goad, MD  Active   insulin  lispro (HUMALOG ) 100 UNIT/ML injection [id number] No For use in pump, total of 60 units per day Emilie Harden, MD Past Week Active Self           Med Note Guido Leeks, Lavonia Powers   Tue Dec 16, 2023  3:54 PM) Note: The patient stated his pump malfunctioned yesterday and a new one is on it's way.  Iron , Ferrous Sulfate , 325 (65 Fe) MG TABS [id number] No Take 1 tablet (325 mg) by mouth daily. Adela Holter, DO 12/15/2023 Morning Active Self  linezolid  (ZYVOX ) 600 MG tablet [id number]  Take 1 tablet (600 mg total) by mouth every 12 (twelve) hours for 5 days. Verlyn Goad, MD  Active   magic mouthwash (nystatin , lidocaine , diphenhydrAMINE, alum & mag hydroxide) suspension [id number] No Swish and spit 5 mLs 3 (three) times daily as needed for mouth pain. Adela Holter, DO Unknown Active Self  magnesium  oxide (MAG-OX) 400 MG tablet [id number] No Take 2 tablets (800 mg total) by mouth daily. Adela Holter, DO 12/15/2023 Morning Active Self  metoCLOPramide  (REGLAN ) 10 MG tablet [id number] No Take 1 tablet (10 mg total) by mouth daily as needed for nausea or vomiting (upset stomach). Adela Holter, DO Unknown Active Self  Multiple Vitamin (MULTI-VITAMIN) tablet [id number] No Take 1 tablet by mouth daily. Adela Holter, DO 12/15/2023 Morning Active Self  mupirocin  ointment (BACTROBAN ) 2 % [id number] No Apply 1 Application topically 2 (two) times daily.  Patient taking differently: Apply 1 Application topically See admin instructions. Apply to affected area of the left ankle 2 times a day    Raspet, Erin K, PA-C 12/15/2023 Active Self  nitroGLYCERIN  (NITROSTAT ) 0.4 MG SL tablet [id number] No Place 0.4 mg under the tongue every 5 (five) minutes x 3 doses as needed for chest pain. [provider] Unknown Active Self  ondansetron  (ZOFRAN -ODT) 4 MG disintegrating tablet [id number] No Take 1 tablet (4 mg total) by mouth every 8 (eight) hours as needed for nausea or vomiting.  Patient taking differently: Take 4 mg by mouth every 8 (eight) hours as needed for nausea or vomiting (dissolve orally).   Adela Holter, DO Unknown Active Self  OXYGEN  [id number] No Inhale 3 L/min into the lungs at bedtime. [provider] 12/15/2023 Bedtime Active Self  pantoprazole  (PROTONIX ) 40 MG tablet [id number] No Take 1 tablet (40 mg total) by mouth daily.  Patient taking differently: Take 40 mg by mouth daily before breakfast.   Adela Holter, DO 12/15/2023 Morning Active Self  polyethylene glycol powder (GLYCOLAX /MIRALAX ) 17 GM/SCOOP powder [id number] No Take 17 g by mouth daily as needed for mild constipation (mix as directed). [provider] Unknown Active Self  tamsulosin  (FLOMAX ) 0.4 MG CAPS capsule [id number] No Take 1 capsule (0.4 mg total) by mouth daily. Adela Holter, DO 12/15/2023 Morning Active  Self  testosterone  (ANDROGEL ) 50 MG/5GM (1%) GEL [id number] No Place 5 g (1 packet) onto the shoulders and upper arms daily as directed. Adela Holter, DO 12/15/2023 Morning Active Self  tiZANidine  (ZANAFLEX ) 4 MG tablet [id number] No Take 1-1&1/2 tablets (4-6 mg total) by mouth every 8 (eight) hours as needed for muscle spasms. Gean Keels, MD Unknown Active Self            Home Care and Equipment/Supplies: Were Home Health Services Ordered?: NA Any new equipment or medical supplies ordered?: NA  Functional Questionnaire: Do you need assistance with bathing/showering or dressing?: No Do you need assistance with meal preparation?: No Do you need assistance with  eating?: No Do you have difficulty maintaining continence: No Do you need assistance with getting out of bed/getting out of a chair/moving?: No Do you have difficulty managing or taking your medications?: No  Follow up appointments reviewed: PCP Follow-up appointment confirmed?: Yes Date of PCP follow-up appointment?: 12/19/23 Follow-up Provider: [facility] Follow-up appointment confirmed?: NA Do you need transportation to your follow-up appointment?: No Do you understand care options if your condition(s) worsen?: Yes-patient verbalized understanding    SIGNATURE Darrall Ellison, LPN [facility] Nurse Health Advisor Direct Dial  [phone]

## 2023-12-26 ENCOUNTER — Ambulatory Visit: Admitting: Family Medicine

## 2023-12-29 ENCOUNTER — Encounter: Payer: Self-pay | Admitting: Dermatology

## 2023-12-31 ENCOUNTER — Encounter: Payer: Self-pay | Admitting: Dermatology

## 2023-12-31 ENCOUNTER — Other Ambulatory Visit (HOSPITAL_BASED_OUTPATIENT_CLINIC_OR_DEPARTMENT_OTHER): Payer: Self-pay

## 2023-12-31 ENCOUNTER — Ambulatory Visit (INDEPENDENT_AMBULATORY_CARE_PROVIDER_SITE_OTHER): Payer: Medicare (Managed Care) | Admitting: Dermatology

## 2023-12-31 VITALS — BP 139/79 | HR 80 | Temp 98.6°F

## 2023-12-31 DIAGNOSIS — C4441 Basal cell carcinoma of skin of scalp and neck: Secondary | ICD-10-CM | POA: Diagnosis not present

## 2023-12-31 DIAGNOSIS — L579 Skin changes due to chronic exposure to nonionizing radiation, unspecified: Secondary | ICD-10-CM | POA: Diagnosis not present

## 2023-12-31 DIAGNOSIS — L814 Other melanin hyperpigmentation: Secondary | ICD-10-CM

## 2023-12-31 DIAGNOSIS — C4491 Basal cell carcinoma of skin, unspecified: Secondary | ICD-10-CM

## 2023-12-31 MED ORDER — OXYCODONE HCL 5 MG PO TABS
5.0000 mg | ORAL_TABLET | Freq: Four times a day (QID) | ORAL | 0 refills | Status: DC | PRN
  Filled 2023-12-31: qty 8, 2d supply, fill #0

## 2023-12-31 NOTE — Progress Notes (Signed)
 Follow-Up Visit   Subjective  Brandon [name] is a 68 y.o. 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 Martina Sledge.   The following portions of the chart were reviewed this encounter and updated as appropriate: medications, allergies, medical history  Review of Systems:  No other skin or systemic complaints except as noted in HPI or Assessment and Plan.  Objective  Well appearing patient in no apparent distress; mood and affect are within normal limits.  A focused examination was performed of the following areas: Mid occipital scalp Relevant physical exam findings are noted in the Assessment and Plan.   Mid Occipital Scalp Healing biopsy site   Assessment & Plan   BASAL CELL CARCINOMA (BCC), UNSPECIFIED SITE Mid Occipital Scalp Mohs surgery  Consent obtained: written  Anticoagulation: Is the patient taking prescription anticoagulant and/or aspirin  prescribed/recommended by a physician? Yes   Was the anticoagulation regimen changed prior to Mohs? No    Anesthesia: Anesthesia method: local infiltration Local anesthetic: lidocaine  1% WITH epi  Procedure Details: Timeout: pre-procedure verification complete Procedure Prep: patient was prepped and draped in usual sterile fashion Prep type: chlorhexidine  Pre-Op diagnosis: basal cell carcinoma BCC subtype: nodular MohsAIQ Surgical site (if tumor spans multiple areas, please select predominant area): scalp Surgical site (from skin exam): Mid Occipital Scalp Pre-operative length (cm): 0.8 Pre-operative width (cm): 0.7 Indications for Mohs surgery: anatomic location where tissue conservation is critical  Micrographic Surgery Details: Post-operative length (cm): 1.3 Post-operative width (cm): 1.4 Number of Mohs stages: 1  Skin repair Complexity:  Complex Final length (cm):  3.8 Informed consent: discussed and consent obtained   Timeout: patient name,  date of birth, surgical site, and procedure verified   Procedure prep:  Patient was prepped and draped in usual sterile fashion Prep type:  Chlorhexidine  Anesthesia: the lesion was anesthetized in a standard fashion   Anesthetic:  1% lidocaine  w/ epinephrine  1-100,000 buffered w/ 8.4% NaHCO3 Reason for type of repair: reduce tension to allow closure, preserve normal anatomy, avoid adjacent structures, allow side-to-side closure without requiring a flap or graft and compensate for the inelasticity of skin in this area   Undermining: area extensively undermined   Subcutaneous layers (deep stitches):  Suture size:  3-0 (with staples) Suture type: Vicryl (polyglactin 910)   Stitches:  Buried vertical mattress Fine/surface layer approximation (top stitches):  Suture type comment:  Staples Hemostasis achieved with: suture, pressure and electrodesiccation Outcome: patient tolerated procedure well with no complications   Post-procedure details: sterile dressing applied and wound care instructions given   Dressing type: bandage and pressure dressing   Related Medications oxyCODONE  (OXY IR/ROXICODONE ) 5 MG immediate release tablet Take 1 tablet (5 mg total) by mouth every 6 (six) hours as needed for up to 8 doses for severe pain (pain score 7-10).  Return in about 2 weeks (around 01/14/2024) for wound check.  Flora Humphreys, CMA, am acting as scribe for Deneise Finlay, MD.    12/31/2023  HISTORY OF PRESENT ILLNESS  Brandon [name] is seen in consultation at the request of Dr. Martina Sledge for biopsy-proven Nodular Basal Cell Carcinoma on the mid occipital scalp. They note that the area has been present for about 6 months increasing in size with time.  There is no history of previous treatment.  Reports no other new or changing lesions and has no other complaints today.  Medications and allergies: see patient chart.  Review  of systems: Reviewed 8 systems and notable for the above skin cancer.  All other  systems reviewed are unremarkable/negative, unless noted in the HPI. Past medical history, surgical history, family history, social history were also reviewed and are noted in the chart/questionnaire.    PHYSICAL EXAMINATION  General: Well-appearing, in no acute distress, alert and oriented x 4. Vitals reviewed in chart (if available).   Skin: Exam reveals a 0.8 x 0.7 cm erythematous papule and biopsy scar on the mid occipital scalp. There are rhytids, telangiectasias, and lentigines, consistent with photodamage.   Biopsy report(s) reviewed, confirming the diagnosis.   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 Due to location, size, histology, or recurrence and the likelihood of subclinical extension as well as the need to conserve normal surrounding tissue, the patient was deemed acceptable for Mohs micrographic surgery (MMS).  The nature and purpose of the procedure, associated benefits and risks including recurrence and scarring, possible complications such as pain, infection, and bleeding, and alternative methods of treatment if appropriate were discussed with the patient during consent. The lesion location was verified by the patient, by reviewing previous notes, pathology reports, and by photographs as well as angulation measurements if available.  Informed consent was reviewed and signed by the patient, and timeout was performed at 10:00 AM. See op note below.  2. For the photodamage and solar lentigines, sun protection discussed/information given on OTC sunscreens, and we recommend continued regular follow-up with primary dermatologist every 6 months or sooner for any growing, bleeding, or changing lesions. 3. Prognosis and future surveillance discussed. 4. Letter with treatment outcome sent to referring provider. 5. Pain acetaminophen /ibuprofen /oxycodone  5 mg  MOHS MICROGRAPHIC SURGERY AND  RECONSTRUCTION  Initial size:   0.8 x 0.7 cm Surgical defect/wound size: 1.3 x 1.4 cm Anesthesia:    0.33% lidocaine  with 1:200,000 epinephrine  EBL:    <5 mL Complications:  None Repair type:   Complex SQ suture:   3-0 Vicryl Cutaneous suture:  Staples Final size of the repair: 3.8 cm  Stages: 1  STAGE I: Anesthesia achieved with 0.5% lidocaine  with 1:200,000 epinephrine . ChloraPrep applied. 2 section(s) excised using Mohs technique (this includes total peripheral and deep tissue margin excision and evaluation with frozen sections, excised and interpreted by the same physician). The tumor was first debulked and then excised with an approx. 2mm margin.  Hemostasis was achieved with electrocautery as needed.  The specimen was then oriented, subdivided/relaxed, inked, and processed using Mohs technique.    Frozen section analysis revealed a clear deep and peripheral margin.  Reconstruction  The surgical wound was then cleaned, prepped, and re-anesthetized as above. Wound edges were undermined extensively along at least one entire edge and at a distance equal to or greater than the width of the defect (see wound defect size above) in order to achieve closure and decrease wound tension and anatomic distortion. Redundant tissue repair including standing cone removal was performed. Hemostasis was achieved with electrocautery. Subcutaneous and epidermal tissues were approximated with the above sutures. The surgical site was then lightly scrubbed with sterile, saline-soaked gauze. The area was then bandaged using Vaseline ointment, non-adherent gauze, gauze pads, and tape to provide an adequate pressure dressing. The patient tolerated the procedure well, was given detailed written and verbal wound care instructions, and was discharged in good condition.   The patient will follow-up: 10 days.  Documentation: I have reviewed the above  documentation for accuracy and completeness, and I agree with the  above.  Deneise Finlay, MD

## 2023-12-31 NOTE — Patient Instructions (Signed)

## 2024-01-01 ENCOUNTER — Telehealth: Payer: Self-pay | Admitting: Internal Medicine

## 2024-01-01 NOTE — Telephone Encounter (Signed)
 Patient is calling to say that her Tandem T-Slim pump had quit working so she got a new pump.  Patient states that she needs the new settings so that she can program her new pump.

## 2024-01-02 ENCOUNTER — Other Ambulatory Visit: Payer: Self-pay | Admitting: Family Medicine

## 2024-01-02 ENCOUNTER — Other Ambulatory Visit (HOSPITAL_BASED_OUTPATIENT_CLINIC_OR_DEPARTMENT_OTHER): Payer: Self-pay

## 2024-01-02 ENCOUNTER — Ambulatory Visit: Payer: Self-pay

## 2024-01-02 DIAGNOSIS — C4491 Basal cell carcinoma of skin, unspecified: Secondary | ICD-10-CM

## 2024-01-02 MED ORDER — OXYCODONE HCL 5 MG PO TABS
5.0000 mg | ORAL_TABLET | Freq: Four times a day (QID) | ORAL | 0 refills | Status: DC | PRN
Start: 2024-01-02 — End: 2024-01-08
  Filled 2024-01-02: qty 8, 2d supply, fill #0

## 2024-01-02 NOTE — Telephone Encounter (Signed)
 Copied from CRM [id number]. Topic: General - Other >> Jan 02, 2024 11:51 AM Kevelyn M wrote: Reason for CRM: Patient called and wanted to see if Dr. Augustus Ledger could call him in a prescription. Had surgery on scalp this Wednesday for basil cell carsanoma  Made a big hole in scalp Gave a prescription for oxycodone  3 day post op Still hurting like it just happened. Dermatology closed  Experiencing a lot of pain   Patient states he had surgery to his scalp for basal cell carcinoma 2 days ago. He states he was prescribed Oxycodone  but has run out, and states his pain feels worse now. He states that he attempted to contact the dermatologist's office who did the procedure but states that they are closed for the weekend. Patient would like to know if Dr. Augustus Ledger cold prescribe him something to help through the weekend. Please contact the patient with a response to his request.    Reason for Disposition  [1] SEVERE post-op pain (e.g., excruciating, pain scale 8-10) AND [2] not controlled with pain medications  Answer Assessment - Initial Assessment Questions 1. SYMPTOM: "What's the main symptom you're concerned about?" (e.g., pain, fever, vomiting)     Pain 2. ONSET: "When did pain start?"     2 days ago 3. SURGERY: "What surgery did you have?"     Basal cell carcinoma surgery to scalp 4. DATE of SURGERY: "When was the surgery?"      12/31/23 6. PAIN: "Is there any pain?" If Yes, ask: "How bad is it?"  (Scale 1-10; or mild, moderate, severe)     Moderate to severe  7. FEVER: "Do you have a fever?" If Yes, ask: "What is your temperature, how was it measured, and when did it start?"     No 8. VOMITING: "Is there any vomiting?" If Yes, ask: "How many times?"     No 9. BLEEDING: "Is there any bleeding?" If Yes, ask: "How much?" and "Where?"     No 10. OTHER SYMPTOMS: "Do you have any other symptoms?" (e.g., drainage from wound, painful urination, constipation)       No  Protocols used: Post-Op  Symptoms and Questions-A-AH

## 2024-01-02 NOTE — Telephone Encounter (Signed)
Please review the previous note

## 2024-01-02 NOTE — Telephone Encounter (Signed)
 This request has been handled. No further action is required. Patient has pick up the rx from the pharmacy and has Tylenol  on hand.

## 2024-01-06 ENCOUNTER — Telehealth: Payer: Self-pay | Admitting: Dermatology

## 2024-01-06 NOTE — Telephone Encounter (Signed)
 Pt called in stating he missed a call from the office. I let the pt know Dr. Fain Home was requesting a photo of his head to evaluate, pt states that is not something he was able to do now as he was at work and couldn't take a picture of the back of his head. I placed the pt on a brief hold to discuss with Dr. Paci, she advised pt needs to come in for an appt. Call was disconnected while pt was on hold. I attempted to call pt back to try and set up appt, call went to vm. Unable to leave a vm.

## 2024-01-07 ENCOUNTER — Telehealth: Payer: Self-pay

## 2024-01-07 NOTE — Telephone Encounter (Signed)
 Pt had called saying surgery site was painful  After reviewing photos, which were normal, told him we could take staples out tomorrow if still uncomfortable. Pt wants to see how he feels tomorrow and he'll call back

## 2024-01-08 ENCOUNTER — Encounter: Payer: Self-pay | Admitting: Dermatology

## 2024-01-08 ENCOUNTER — Ambulatory Visit: Admitting: Dermatology

## 2024-01-08 ENCOUNTER — Other Ambulatory Visit (HOSPITAL_BASED_OUTPATIENT_CLINIC_OR_DEPARTMENT_OTHER): Payer: Self-pay

## 2024-01-08 DIAGNOSIS — C4491 Basal cell carcinoma of skin, unspecified: Secondary | ICD-10-CM

## 2024-01-08 DIAGNOSIS — Z85828 Personal history of other malignant neoplasm of skin: Secondary | ICD-10-CM

## 2024-01-08 DIAGNOSIS — Z48817 Encounter for surgical aftercare following surgery on the skin and subcutaneous tissue: Secondary | ICD-10-CM

## 2024-01-08 MED ORDER — DOXYCYCLINE HYCLATE 100 MG PO CAPS
100.0000 mg | ORAL_CAPSULE | Freq: Two times a day (BID) | ORAL | 0 refills | Status: AC
  Filled 2024-01-08: qty 14, 7d supply, fill #0

## 2024-01-08 MED ORDER — OXYCODONE HCL 5 MG PO TABS
5.0000 mg | ORAL_TABLET | ORAL | 0 refills | Status: AC | PRN
  Filled 2024-01-08: qty 30, 5d supply, fill #0

## 2024-01-08 NOTE — Progress Notes (Signed)
   Follow Up Visit   Subjective  Brandon [name] is a 68 y.o. male who presents for the following: follow up from Mohs surgery   The patient presents for follow up from Mohs surgery for a Nodular Basal Cell Carcinoma on the vertex scalp, treated on 12/31/2023, repaired with linear closure. The patient has been bandaging the wound as directed. The endorse the following concerns: some tenderness  The following portions of the chart were reviewed this encounter and updated as appropriate: medications, allergies, medical history  Review of Systems:  No other skin or systemic complaints except as noted in HPI or Assessment and Plan.  Objective  Well appearing patient in no apparent distress; mood and affect are within normal limits.  A focused exam was performed including scalp, head.  All findings within normal limits unless otherwise noted below.  Healing wound with mild erythema  Relevant physical exam findings are noted in the Assessment and Plan.     Assessment & Plan   Healing s/p Mohs for [facility] on the vertex scalp, treated on 12/31/2023, repaired with linear closure - Reassured that wound is healing well - Staples removed - No swelling, induration, purulence, dehiscence out of proportion to the clinical exam, see photo above - Some mild tenderness - Discussed that scars take up to 12 months to mature from the date of surgery - Recommend SPF 30+ to scar daily to prevent purple color from UV exposure during scar maturation process - Discussed that erythema and raised appearance of scar will fade over the next 4-6 months - OK to start scar massage at 4-6 weeks post-op - Can consider silicone based products for scar healing starting at 6 weeks post-op  HISTORY OF BASAL CELL CARCINOMA OF THE SKIN - No evidence of recurrence today - Recommend regular full body skin exams - Recommend daily broad spectrum sunscreen SPF 30+ to sun-exposed areas, reapply every 2 hours as needed.  - Call  if any new or changing lesions are noted between office visits  Mild Tenderness- no infection  - Will start 1 week of doxycycline   BASAL CELL CARCINOMA (BCC), UNSPECIFIED SITE   Related Medications oxyCODONE  (OXY IR/ROXICODONE ) 5 MG immediate release tablet Take 1 tablet (5 mg total) by mouth every 4 (four) hours as needed for severe pain (pain score 7-10).  Return in about 2 weeks (around 01/22/2024).   Documentation: I have reviewed the above documentation for accuracy and completeness, and I agree with the above.  Deneise Finlay, MD

## 2024-01-10 ENCOUNTER — Other Ambulatory Visit: Payer: Self-pay | Admitting: Family Medicine

## 2024-01-12 ENCOUNTER — Other Ambulatory Visit: Payer: Self-pay

## 2024-01-12 ENCOUNTER — Other Ambulatory Visit (HOSPITAL_BASED_OUTPATIENT_CLINIC_OR_DEPARTMENT_OTHER): Payer: Self-pay

## 2024-01-12 MED ORDER — BUPROPION HCL ER (XL) 150 MG PO TB24
150.0000 mg | ORAL_TABLET | Freq: Every day | ORAL | 1 refills | Status: AC
  Filled 2024-01-12 – 2024-09-29 (×4): qty 90, 90d supply, fill #0

## 2024-01-13 ENCOUNTER — Other Ambulatory Visit (HOSPITAL_BASED_OUTPATIENT_CLINIC_OR_DEPARTMENT_OTHER): Payer: Self-pay

## 2024-01-14 ENCOUNTER — Ambulatory Visit (INDEPENDENT_AMBULATORY_CARE_PROVIDER_SITE_OTHER): Admitting: Dermatology

## 2024-01-14 ENCOUNTER — Encounter: Payer: Self-pay | Admitting: Dermatology

## 2024-01-14 VITALS — BP 124/81 | HR 71

## 2024-01-14 DIAGNOSIS — L539 Erythematous condition, unspecified: Secondary | ICD-10-CM | POA: Diagnosis not present

## 2024-01-14 DIAGNOSIS — Z85828 Personal history of other malignant neoplasm of skin: Secondary | ICD-10-CM | POA: Diagnosis not present

## 2024-01-14 DIAGNOSIS — C4491 Basal cell carcinoma of skin, unspecified: Secondary | ICD-10-CM | POA: Insufficient documentation

## 2024-01-14 DIAGNOSIS — L905 Scar conditions and fibrosis of skin: Secondary | ICD-10-CM

## 2024-01-14 HISTORY — DX: Basal cell carcinoma of skin, unspecified: C44.91

## 2024-01-14 NOTE — Progress Notes (Signed)
   Follow Up Visit   Subjective  Brandon [name] is a 68 y.o. 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 The patient has been bandaging the wound as directed. The endorse the following concerns: No questions or concerns at this time. Completed doxycycline .   The following portions of the chart were reviewed this encounter and updated as appropriate: medications, allergies, medical history  Review of Systems:  No other skin or systemic complaints except as noted in HPI or Assessment and Plan.  Objective  Well appearing patient in no apparent distress; mood and affect are within normal limits.  A full examination was performed including scalp, head, face and scalp. All findings within normal limits unless otherwise noted below.  Healing wound with mild erythema  Relevant physical exam findings are noted in the Assessment and Plan.    Assessment & Plan   Healing s/p Mohs for [facility], treated on 12/31/23, repaired with linear closure - Reassured that wound is healing well - No evidence of infection - No swelling, induration, purulence, dehiscence, or tenderness out of proportion to the clinical exam, see photo above - Discussed that scars take up to 12 months to mature from the date of surgery - Recommend SPF 30+ to scar daily to prevent purple color from UV exposure during scar maturation process - Discussed that erythema and raised appearance of scar will fade over the next 4-6 months - OK to start scar massage at 4-6 weeks post-op - Can consider silicone based products for scar healing starting at 6 weeks post-op - Ok to discontinue ointment daily to wound.  HISTORY OF BASAL CELL CARCINOMA OF THE SKIN - No evidence of recurrence today - Recommend regular full body skin exams - Recommend daily broad spectrum sunscreen SPF 30+ to  sun-exposed areas, reapply every 2 hours as needed.  - Call if any new or changing lesions are noted between office visits  Return if symptoms worsen or fail to improve.  I, Haig Levan, Surg Tech III, am acting as scribe for Deneise Finlay, MD.   Documentation: I have reviewed the above documentation for accuracy and completeness, and I agree with the above.  Deneise Finlay, MD

## 2024-01-14 NOTE — Patient Instructions (Signed)
 Post-Operative Scar Care: Education and Recommendations  Following your procedure, it's important to care for your scar to promote optimal healing and minimize its appearance. Proper post-operative care can help ensure that the scar heals well, and with time, it may become less noticeable. Below are key recommendations for scar care, including scar massage and the use of silicone scar gels or sheets.  1. General Scar Care Tips: -  Keep the wound clean and dry: Follow your healthcare provider's instructions for wound care, including cleaning the site and changing dressings as needed. -  Avoid sun exposure: Direct sunlight can darken scars and make them more noticeable. Once your wound has healed, apply sunscreen (SPF 30 or higher) to protect the scar from UV rays.  2. Scar Massage: - Start after healing: Wait until the scar has fully healed, with no scabs or open areas (usually 4-6 weeks after surgery). Your healthcare provider will give you specific guidance on when to begin. - Technique: Gently massage the scar in a circular motion for 5-10 minutes, 2-3 times per day. This helps to soften the tissue, reduce swelling, and improve the overall appearance of the scar. - Pressure: Apply gentle, firm pressure during the massage to break down the dense tissue that may form during healing. This helps to prevent the formation of keloids or hypertrophic scars. - Use lotion or ointment: Consider using a mild, fragrance-free lotion or vitamin E ointment to help lubricate the area during massage.  3. Silicone Scar Gels or Sheets: - When to start: Once your wound has healed completely, typically around 4-6 weeks, you can begin using silicone-based scar gels or sheets. These have been shown to improve scar appearance by hydrating the tissue and reducing inflammation. - How to use silicone gels: Apply a thin layer of the gel to the scar and allow it to dry before covering with clothing. You can use the  gel multiple times a day, depending on your provider's recommendation. - How to use silicone sheets: Cut the sheet to fit the size of your scar, and apply it directly to the healed scar. Wear it for 12-24 hours a day, and replace the sheet every few days as directed. - Benefits: Silicone helps reduce redness, flatten the scar, and improve its texture. Continued use over several months can lead to significant improvement in the appearance of the scar.  4. What to Expect: - Healing process: Scars generally take time to mature. The first few months may show redness or swelling, but this usually improves as healing progresses. - Long-term care: Scarring is a natural part of the healing process. While you cannot completely eliminate a scar, proper care can significantly improve its appearance over time. - Patience: It can take up to a year for a scar to fully mature, so it's important to be consistent with scar care and follow-up appointments with your provider.  5. When to Contact Your Healthcare Provider: - If you notice signs of infection (increased redness, warmth, drainage, or pain). - If your scar becomes unusually raised, itchy, or changes in color significantly. - If you have concerns about the appearance of your scar or experience unusual symptoms. - By following these guidelines, you can support your body's natural healing process and help ensure the best possible outcome for your scar. If you have any questions or concerns, please don't hesitate to contact our office.   Important Information  Due to recent changes in healthcare laws, you may see results  of your pathology and/or laboratory studies on MyChart before the doctors have had a chance to review them. We understand that in some cases there may be results that are confusing or concerning to you. Please understand that not all results are received at the same time and often the doctors may need to interpret multiple results in order to  provide you with the best plan of care or course of treatment. Therefore, we ask that you please give [place] 2 business days to thoroughly review all your results before contacting the office for clarification. Should we see a critical lab result, you will be contacted sooner.   If You Need Anything After Your Visit  If you have any questions or concerns for your doctor, please call our main line at [phone] If no one answers, please leave a voicemail as directed and we will return your call as soon as possible. Messages left after 4 pm will be answered the following business day.   You may also send [place] a message via MyChart. We typically respond to MyChart messages within 1-2 business days.  For prescription refills, please ask your pharmacy to contact our office. Our fax number is [phone].  If you have an urgent issue when the clinic is closed that cannot wait until the next business day, you can page your doctor at the number below.    Please note that while we do our best to be available for urgent issues outside of office hours, we are not available 24/7.   If you have an urgent issue and are unable to reach [place], you may choose to seek medical care at your doctor's office, retail clinic, urgent care center, or emergency room.  If you have a medical emergency, please immediately call 911 or go to the emergency department. In the event of inclement weather, please call our main line at [phone] for an update on the status of any delays or closures.  Dermatology Medication Tips: Please keep the boxes that topical medications come in in order to help keep track of the instructions about where and how to use these. Pharmacies typically print the medication instructions only on the boxes and not directly on the medication tubes.   If your medication is too expensive, please contact our office at [phone] or send [place] a message through MyChart.   We are unable to tell what your  co-pay for medications will be in advance as this is different depending on your insurance coverage. However, we may be able to find a substitute medication at lower cost or fill out paperwork to get insurance to cover a needed medication.   If a prior authorization is required to get your medication covered by your insurance company, please allow [place] 1-2 business days to complete this process.  Drug prices often vary depending on where the prescription is filled and some pharmacies may offer cheaper prices.  The website www.goodrx.com contains coupons for medications through different pharmacies. The prices here do not account for what the cost may be with help from insurance (it may be cheaper with your insurance), but the website can give you the price if you did not use any insurance.  - You can print the associated coupon and take it with your prescription to the pharmacy.  - You may also stop by our office during regular business hours and pick up a GoodRx coupon card.  - If you need your prescription sent electronically to a different pharmacy, notify our office  through [facility] or by phone at [phone]    Skin Education :   I counseled the patient regarding the following: Sun screen (SPF 30 or greater) should be applied during peak UV exposure (between 10am and 2pm) and reapplied after exercise or swimming.  The ABCDEs of melanoma were reviewed with the patient, and the importance of monthly self-examination of moles was emphasized. Should any moles change in shape or color, or itch, bleed or burn, pt will contact our office for evaluation sooner then their interval appointment.  Plan: Sunscreen Recommendations I recommended a broad spectrum sunscreen with a SPF of 30 or higher. I explained that SPF 30 sunscreens block approximately 97 percent of the sun's harmful rays. Sunscreens should be applied at least 15 minutes prior to expected sun exposure and then every 2 hours  after that as long as sun exposure continues. If swimming or exercising sunscreen should be reapplied every 45 minutes to an hour after getting wet or sweating. One ounce, or the equivalent of a shot glass full of sunscreen, is adequate to protect the skin not covered by a bathing suit. I also recommended a lip balm with a sunscreen as well. Sun protective clothing can be used in lieu of sunscreen but must be worn the entire time you are exposed to the sun's rays.

## 2024-01-15 ENCOUNTER — Encounter: Payer: Self-pay | Admitting: Dermatology

## 2024-01-21 ENCOUNTER — Ambulatory Visit: Admitting: Dermatology

## 2024-01-22 ENCOUNTER — Other Ambulatory Visit (HOSPITAL_BASED_OUTPATIENT_CLINIC_OR_DEPARTMENT_OTHER): Payer: Self-pay

## 2024-01-23 ENCOUNTER — Other Ambulatory Visit (HOSPITAL_BASED_OUTPATIENT_CLINIC_OR_DEPARTMENT_OTHER): Payer: Self-pay

## 2024-01-26 ENCOUNTER — Other Ambulatory Visit: Payer: Self-pay | Admitting: Family Medicine

## 2024-01-26 ENCOUNTER — Other Ambulatory Visit (HOSPITAL_BASED_OUTPATIENT_CLINIC_OR_DEPARTMENT_OTHER): Payer: Self-pay

## 2024-01-26 ENCOUNTER — Ambulatory Visit: Payer: 59 | Admitting: Family Medicine

## 2024-01-26 ENCOUNTER — Other Ambulatory Visit: Payer: Self-pay | Admitting: Gastroenterology

## 2024-01-26 MED ORDER — PANTOPRAZOLE SODIUM 40 MG PO TBEC
40.0000 mg | DELAYED_RELEASE_TABLET | Freq: Every day | ORAL | 0 refills | Status: AC
  Filled 2024-01-27 – 2024-02-24 (×2): qty 90, 90d supply, fill #0

## 2024-01-26 MED ORDER — ARIPIPRAZOLE 10 MG PO TABS
10.0000 mg | ORAL_TABLET | Freq: Every morning | ORAL | 1 refills | Status: AC
  Filled 2024-01-27 – 2024-02-24 (×2): qty 90, 90d supply, fill #0

## 2024-01-27 ENCOUNTER — Other Ambulatory Visit: Payer: Self-pay | Admitting: Gastroenterology

## 2024-01-27 ENCOUNTER — Other Ambulatory Visit (HOSPITAL_BASED_OUTPATIENT_CLINIC_OR_DEPARTMENT_OTHER): Payer: Self-pay

## 2024-01-27 ENCOUNTER — Other Ambulatory Visit: Payer: Self-pay | Admitting: Family Medicine

## 2024-01-27 ENCOUNTER — Other Ambulatory Visit: Payer: Self-pay

## 2024-01-27 MED ORDER — BUPROPION HCL ER (XL) 300 MG PO TB24
300.0000 mg | ORAL_TABLET | Freq: Every day | ORAL | 1 refills | Status: AC
  Filled 2024-01-27 – 2024-09-29 (×3): qty 90, 90d supply, fill #0

## 2024-01-28 ENCOUNTER — Encounter: Payer: Self-pay | Admitting: Dermatology

## 2024-01-28 ENCOUNTER — Other Ambulatory Visit (HOSPITAL_BASED_OUTPATIENT_CLINIC_OR_DEPARTMENT_OTHER): Payer: Self-pay

## 2024-02-04 ENCOUNTER — Ambulatory Visit: Admitting: Podiatry

## 2024-02-12 ENCOUNTER — Other Ambulatory Visit (HOSPITAL_BASED_OUTPATIENT_CLINIC_OR_DEPARTMENT_OTHER): Payer: Self-pay

## 2024-02-17 ENCOUNTER — Ambulatory Visit: Admitting: Internal Medicine

## 2024-02-17 NOTE — Progress Notes (Deleted)
 Patient ID: Brandon [name], male   DOB: 1956-06-10, 68 y.o.   MRN: [id number]  HPI: Brandon [name] is a 68 y.o.-year-old male, returning for follow-up for DM2, dx in 2004, insulin -dependent since 2012, controlled, with complications (CAD, gastroparesis).  He previously saw Dr. Kassie.  Last visit with me 9 months ago.  Interim history: No increased urination, blurry vision, nausea, chest pain. He a partial SBO 06/2022 (the 3rd episode). He was taken off Ozempic  >> was off GLP-1 receptor agonist  >> started Mounjaro  by PCP. He had another SBO 05/2023.   He also had a fall 08/2023.   He had left lower extremity cellulitis and was admitted 12/16/2023.  This resolved.  Insulin  pump: - T: slim x2  CGM: - Dexcom G6  Insulin : - Humalog   Supplier: [employer]  Reviewed HbA1c: Lab Results  Component Value Date   HGBA1C 11.6 (H) 12/16/2023   HGBA1C 6.7 (H) 03/27/2023   HGBA1C 6.6 (H) 09/26/2022   HGBA1C 6.1 (A) 05/13/2022   HGBA1C 6.2 (A) 01/07/2022   HGBA1C 6.3 (H) 11/01/2021   HGBA1C 6.4 (A) 09/04/2021   HGBA1C 6.5 (H) 08/28/2021   HGBA1C 6.1 (A) 05/29/2021   HGBA1C 6.3 (A) 03/22/2021   Pt is on a regimen of: - Jardiance  25 mg before breakfast - Ozempic  2 mg weekly - lost 65 lbs in last 1.5 years >> Mounjaro  12.5 >> 15 mg weekly (increased by PCP)  Insulin  pump settings: - Basal rates: 12 am: 0.5 units/h - Insulin  to carb ratio: 12 am: 1:5 >> 1:15 >> 1:20 - Target: 12 am: 1:120 - Correction factor (insulin  sensitivity factor):  12 am: 100 - Active insulin  time: 5h - Changes infusion site: q3 days  Total daily dose from basal insulin : 16 units (59%) >> 61% Total daily dose from bolus insulin : 12 units (41%) >> 39% TDD: 30-50 units Average daily boluses: 8, of which only 1 manual!  He checks his sugars with a Dexcom CGM, more than 4 times a day:  Previously:  Previously:   Lowest sugar was 60 >> 54 >> 48 >> 110; he has hypoglycemia awareness at 70.  Highest sugar  was 359 >> 261 >> 278 >> 240s.  Glucometer:Freestyle  - no CKD, last BUN/creatinine:  Lab Results  Component Value Date   BUN 11 12/17/2023   BUN 5 (L) 12/16/2023   CREATININE 0.75 12/17/2023   CREATININE 0.74 12/16/2023   Lab Results  Component Value Date   MICRALBCREAT 55 (H) 03/27/2023   MICRALBCREAT <30 03/26/2022  Not on ACE inhibitor/ARB.  -+ Dyslipidemia; last set of lipids: Lab Results  Component Value Date   CHOL 129 03/27/2023   HDL 36 (L) 03/27/2023   LDLCALC 52 03/27/2023   TRIG 259 (H) 03/27/2023   CHOLHDL 3.3 03/26/2022  On Lipitor  80 mg daily.  - last eye exam was on 12/04/2022: No DR.  - no numbness and tingling in his feet.  Last foot exam 11/04/2023.  On ASA 81.  He also has a history of hypogonadism-on testosterone , anxiety/depression, HTN. He had an Inspire device inserted to help with OSA. He likes it.  However, he had oxygen  desaturation at night and was started on oxygen .  Since this was drying his mouth and nose, he now also has a humidifier.  ROS: + see HPI  Past Medical History:  Diagnosis Date   Anxiety    Arthritis    Basal cell carcinoma    Basal cell carcinoma (BCC) in situ of  skin    left neck   BCC (basal cell carcinoma of skin) 01/14/2024   On vertex scalp treated with Mohs 12/31/23    Chronic back pain    has spinal cord stimulator and wears buprenorphine  patch   Colon polyps    Coronary artery disease    S/p DES to mRCA in 12/21 // S/p DES to LCx x 2 in 12/2020 // Diff dz in small to mod Dx (not a good target for PCI); OM1 100 CTO; severe dz in small OM2 and PDA >> Med Rx   Depression    Diabetes mellitus without complication (HCC)    type 2   Fibromyalgia    Gallstones    GERD (gastroesophageal reflux disease)    Hepatitis B    HLD (hyperlipidemia)    Hypertension    Osteoarthritis    Pneumonia    PONV (postoperative nausea and vomiting)    SBO (small bowel obstruction) (HCC)    Sleep apnea    does not use CPAP    Past Surgical History:  Procedure Laterality Date   CARDIAC CATHETERIZATION  01/17/2021   CATARACT EXTRACTION W/ INTRAOCULAR LENS  IMPLANT, BILATERAL     CHOLECYSTECTOMY     CHOLECYSTECTOMY, LAPAROSCOPIC     COLONOSCOPY WITH PROPOFOL  N/A 12/19/2022   Procedure: COLONOSCOPY WITH PROPOFOL ;  Surgeon: Stacia Glendia BRAVO, MD;  Location: [facility] ENDOSCOPY;  Service: Gastroenterology;  Laterality: N/A;   CORONARY STENT INTERVENTION N/A 08/15/2020   Procedure: CORONARY STENT INTERVENTION;  Surgeon: Dann Candyce RAMAN, MD;  Location: [facility] INVASIVE CV LAB;  Service: Cardiovascular;  Laterality: N/A;   CORONARY STENT INTERVENTION N/A 01/17/2021   Procedure: CORONARY STENT INTERVENTION;  Surgeon: Verlin Lonni BIRCH, MD;  Location: [facility] INVASIVE CV LAB;  Service: Cardiovascular;  Laterality: N/A;   CORONARY ULTRASOUND/IVUS N/A 08/15/2020   Procedure: Intravascular Ultrasound/IVUS;  Surgeon: Dann Candyce RAMAN, MD;  Location: [facility] INVASIVE CV LAB;  Service: Cardiovascular;  Laterality: N/A;   DRUG INDUCED ENDOSCOPY N/A 05/23/2022   Procedure: DRUG INDUCED SLEEP ENDOSCOPY;  Surgeon: Mable Lenis, MD;  [facility];  Service: ENT;  Laterality: N/A;   HERNIA REPAIR     IMPLANTATION OF HYPOGLOSSAL NERVE STIMULATOR Right 07/30/2022   Procedure: IMPLANTATION OF HYPOGLOSSAL NERVE STIMULATOR;  Surgeon: Mable Lenis, MD;  Location: [facility];  Service: ENT;  Laterality: Right;   LEFT HEART CATH AND CORONARY ANGIOGRAPHY N/A 08/15/2020   Procedure: LEFT HEART CATH AND CORONARY ANGIOGRAPHY;  Surgeon: Dann Candyce RAMAN, MD;  Location: [facility] INVASIVE CV LAB;  Service: Cardiovascular;  Laterality: N/A;   LEFT HEART CATH AND CORONARY ANGIOGRAPHY N/A 01/17/2021   Procedure: LEFT HEART CATH AND CORONARY ANGIOGRAPHY;  Surgeon: Verlin Lonni BIRCH, MD;  Location: [facility] INVASIVE CV LAB;  Service: Cardiovascular;  Laterality: N/A;   LUMBAR FUSION     L3-5 with rods   MICRODISCECTOMY LUMBAR  2006    POLYPECTOMY  12/19/2022   Procedure: POLYPECTOMY;  Surgeon: Stacia Glendia BRAVO, MD;  Location: [facility] ENDOSCOPY;  Service: Gastroenterology;;   SPINAL CORD STIMULATOR INSERTION     blood clot removed from spinal cord   SPINE SURGERY     blood clot removed from spinal cord   XI ROBOTIC ASSISTED INGUINAL HERNIA REPAIR WITH MESH     x 3 surgeries   Social History   Socioeconomic History   Marital status: Married    Spouse name: Not on file   Number of children: 4   Years of education: 102  Highest education level: Not on file  Occupational History   Occupation: disabled/ respiratory therapist  Tobacco Use   Smoking status: Former    Current packs/day: 0.00    Average packs/day: 1 pack/day for 30.0 years (30.0 ttl pk-yrs)    Types: Cigarettes    Start date: 8    Quit date: 2007    Years since quitting: 18.4   Smokeless tobacco: Never  Vaping Use   Vaping status: Never Used  Substance and Sexual Activity   Alcohol use: Not Currently   Drug use: Not Currently   Sexual activity: Yes  Other Topics Concern   Not on file  Social History Narrative   ** Merged History Encounter **       Social Drivers of Health   Financial Resource Strain: Not on file  Food Insecurity: No Food Insecurity (12/16/2023)   Hunger Vital Sign    Worried About Running Out of Food in the Last Year: Never true    Ran Out of Food in the Last Year: Never true  Transportation Needs: No Transportation Needs (12/16/2023)   PRAPARE - Administrator, Civil Service (Medical): No    Lack of Transportation (Non-Medical): No  Physical Activity: Not on file  Stress: Not on file  Social Connections: Moderately Isolated (12/16/2023)   Social Connection and Isolation Panel    Frequency of Communication with Friends and Family: More than three times a week    Frequency of Social Gatherings with Friends and Family: More than three times a week    Attends Religious Services: Never    Database administrator  or Organizations: Yes    Attends Banker Meetings: Never    Marital Status: Divorced  Catering manager Violence: Not At Risk (12/16/2023)   Humiliation, Afraid, Rape, and Kick questionnaire    Fear of Current or Ex-Partner: No    Emotionally Abused: No    Physically Abused: No    Sexually Abused: No   Current Outpatient Medications on File Prior to Visit  Medication Sig Dispense Refill   acetaminophen  (TYLENOL ) 500 MG tablet Take 500-1,000 mg by mouth every 6 (six) hours as needed (for pain or headaches).     albuterol  (VENTOLIN  HFA) 108 (90 Base) MCG/ACT inhaler Inhale 2 puffs into the lungs every 6 (six) hours as needed for wheezing or shortness of breath (OR WHEN SICK).     AMBULATORY NON FORMULARY MEDICATION Please provide home oxygen  for nocturnal use.  Apply 2-4L of O2 via nasal cannula at bedtime.  Dx: Nocturnal Oxygen  Desaturation G47.34 1 Device 0   AMBULATORY NON FORMULARY MEDICATION Please provide humidifier for home oxygen  due to nasal irritation with use. 1 Units 0   ammonium lactate  (AMLACTIN) 12 % cream Apply to the affected area(s) topically as needed for dry skin. 385 g 0   ARIPiprazole  (ABILIFY ) 10 MG tablet Take 1 tablet (10 mg total) by mouth every morning. 90 tablet 1   atorvastatin  (LIPITOR ) 80 MG tablet Take 1 tablet (80 mg total) by mouth daily. 90 tablet 3   b complex vitamins capsule Take 1 capsule by mouth in the morning.     buPROPion  (WELLBUTRIN  XL) 150 MG 24 hr tablet Take 1 tablet (150 mg total) by mouth daily in addition to 300mg  tablet. 90 tablet 1   buPROPion  (WELLBUTRIN  XL) 300 MG 24 hr tablet Take 1 tablet (300 mg total) by mouth daily. 90 tablet 1   clopidogrel  (PLAVIX ) 75 MG tablet Take  1 tablet (75 mg total) by mouth daily with breakfast. 90 tablet 3   Continuous Glucose Sensor (DEXCOM G7 SENSOR) MISC Inject 1 Device into the skin See admin instructions. Place 1 new sensor into the skin every 10 days     docusate sodium  (COLACE) 100 MG  capsule Take 100 mg by mouth 2 (two) times daily as needed for mild constipation.     empagliflozin  (JARDIANCE ) 25 MG TABS tablet Take 1 tablet (25 mg total) by mouth daily. 90 tablet 3   GVOKE HYPOPEN  1-PACK 1 MG/0.2ML SOAJ Use as needed for hypoglycemia 0.2 mL PRN   insulin  lispro (HUMALOG ) 100 UNIT/ML injection For use in pump, total of 60 units per day 60 mL 3   Iron , Ferrous Sulfate , 325 (65 Fe) MG TABS Take 1 tablet (325 mg) by mouth daily. 100 tablet 1   magic mouthwash (nystatin , lidocaine , diphenhydrAMINE, alum & mag hydroxide) suspension Swish and spit 5 mLs 3 (three) times daily as needed for mouth pain. 200 mL 1   magnesium  oxide (MAG-OX) 400 MG tablet Take 2 tablets (800 mg total) by mouth daily. 90 tablet 3   metoCLOPramide  (REGLAN ) 10 MG tablet Take 1 tablet (10 mg total) by mouth daily as needed for nausea or vomiting (upset stomach). 90 tablet 1   Multiple Vitamin (MULTI-VITAMIN) tablet Take 1 tablet by mouth daily. 130 tablet 1   mupirocin  ointment (BACTROBAN ) 2 % Apply 1 Application topically 2 (two) times daily. (Patient taking differently: Apply 1 Application topically See admin instructions. Apply to affected area of the left ankle 2 times a day) 22 g 0   nitroGLYCERIN  (NITROSTAT ) 0.4 MG SL tablet Place 0.4 mg under the tongue every 5 (five) minutes x 3 doses as needed for chest pain.     ondansetron  (ZOFRAN -ODT) 4 MG disintegrating tablet Take 1 tablet (4 mg total) by mouth every 8 (eight) hours as needed for nausea or vomiting. (Patient taking differently: Take 4 mg by mouth every 8 (eight) hours as needed for nausea or vomiting (dissolve orally).) 20 tablet 0   oxyCODONE  (OXY IR/ROXICODONE ) 5 MG immediate release tablet Take 1 tablet (5 mg total) by mouth every 4 (four) hours as needed for severe pain (pain score 7-10). 30 tablet 0   OXYGEN  Inhale 3 L/min into the lungs at bedtime.     pantoprazole  (PROTONIX ) 40 MG tablet Take 1 tablet (40 mg total) by mouth daily. 90 tablet 0    polyethylene glycol powder (GLYCOLAX /MIRALAX ) 17 GM/SCOOP powder Take 17 g by mouth daily as needed for mild constipation (mix as directed).     tamsulosin  (FLOMAX ) 0.4 MG CAPS capsule Take 1 capsule (0.4 mg total) by mouth daily. 90 capsule 1   testosterone  (ANDROGEL ) 50 MG/5GM (1%) GEL Place 5 g (1 packet) onto the shoulders and upper arms daily as directed. 150 g 3   tiZANidine  (ZANAFLEX ) 4 MG tablet Take 1-1&1/2 tablets (4-6 mg total) by mouth every 8 (eight) hours as needed for muscle spasms. 90 tablet 3   No current facility-administered medications on file prior to visit.   Allergies  Allergen Reactions   Kiwi Extract Swelling and Other (See Comments)    Mouth swelling   Other Swelling and Other (See Comments)    EGGPLANT = Mouth swelling   Penicillins Rash   Ancef [Cefazolin] Rash   Latex Rash   Levaquin [Levofloxacin] Rash   Peach [Prunus Persica] Other (See Comments)    These burn the mouth   Family History  Problem Relation Age of Onset   COPD Mother    Anxiety disorder Mother    Depression Mother    Colon cancer Mother    Cancer Father        mets, origin unknown   Anxiety disorder Father    Depression Father    Coronary artery disease Father    CAD Brother    Leukemia Maternal Grandmother    Multiple sclerosis Daughter    Diabetes Neg Hx    PE: There were no vitals taken for this visit. Wt Readings from Last 10 Encounters:  12/19/23 197 lb (89.4 kg)  12/16/23 195 lb (88.5 kg)  10/30/23 206 lb (93.4 kg)  09/17/23 210 lb (95.3 kg)  09/15/23 204 lb (92.5 kg)  08/14/23 210 lb (95.3 kg)  08/08/23 213 lb 13.5 oz (97 kg)  08/08/23 215 lb 4 oz (97.6 kg)  07/29/23 214 lb (97.1 kg)  06/17/23 200 lb 0.6 oz (90.7 kg)   Constitutional: overweight, in NAD Eyes:  EOMI, no exophthalmos ENT: no neck masses, no cervical lymphadenopathy Cardiovascular: RRR, No MRG Respiratory: CTA B Musculoskeletal: no deformities Skin:no rashes Neurological: no tremor with  outstretched hands  ASSESSMENT: 1. DM2, insulin -dependent, uncontrolled, with complications - CAD - s/p 3 stents - Gastroparesis  2. HL  PLAN:  1. Patient with longstanding, fairly well-controlled type 2 diabetes, on SGLT2 inhibitor, GLP-1/GIP receptor agonist, and insulin  pump, with previous good control, and an HbA1c of 6.7% at our last visit in 03/2023, but significantly increased since then.  Two months ago he had another HbA1c that was much higher, at 11.6%. - At last visit, sugars were fluctuating around the upper limit of the target range, with higher blood sugars after breakfast, lunch and particularly dinner.  He was missing many insulin  doses before meals and he had days in which he only relied on automatic, correction, boluses, without doing any mealtime boluses.  When he did bolus, he was entering a large amount of carbs when the sugars were already high after meals and we discussed that this was too late and could only lead to low blood sugars afterwards.  I advised him to try his best to inject 15 minutes before a meal but if he had to bolus after the meal, to only enter 50% of the carbs at that time.  We did not change his pump settings otherwise, pending better compliance with bolusing. -Of note, he was previously on Ozempic  and had an SBO.  He was taken off Ozempic  at that time and he started Mounjaro  afterwards which he tolerates well.  CGM interpretation: -At today's visit, we reviewed his CGM downloads: It appears that *** of values are in target range (goal >70%), while *** are higher than 180 (goal <25%), and *** are lower than 70 (goal <4%).  The calculated average blood sugar is ***.  The projected HbA1c for the next 3 months (GMI) is ***. -Reviewing the CGM trends, ***  - I suggested to:  Patient Instructions  Please use the following pump settings: - Basal rates: 12 am: 0.5 units/h - Insulin  to carb ratio: 12 am: 1:20 - Target: 12 am: 1:120 - Correction factor  (insulin  sensitivity factor):  12 am: 100 - Active insulin  time: 5h - Changes infusion site: q3 days   Please do the following approximately 15 minutes before every meal: - Enter carbs (C) - Enter sugars (S) - Start insulin  bolus (I)   If you bolus insulin  after a meal, try to  enter just 50% of the carbs + correct the high blood sugar.   Please continue: - Jardiance  25 mg before breakfast - Mounjaro  15 mg weekly    Please return in 6 months.  - we checked his HbA1c: 7%  - advised to check sugars at different times of the day - 4x a day, rotating check times - advised for yearly eye exams >> he is UTD - return to clinic in 6 months  2. HL - Latest lipid panel was reviewed from 03/2023: LDL at goal, triglycerides high, HDL low: Lab Results  Component Value Date   CHOL 129 03/27/2023   HDL 36 (L) 03/27/2023   LDLCALC 52 03/27/2023   TRIG 259 (H) 03/27/2023   CHOLHDL 3.3 03/26/2022  -He continues on Lipitor  80 mg daily without side effects  Lela Fendt, MD PhD [facility] Endocrinology

## 2024-02-23 ENCOUNTER — Other Ambulatory Visit (HOSPITAL_BASED_OUTPATIENT_CLINIC_OR_DEPARTMENT_OTHER): Payer: Self-pay

## 2024-02-24 ENCOUNTER — Other Ambulatory Visit (HOSPITAL_BASED_OUTPATIENT_CLINIC_OR_DEPARTMENT_OTHER): Payer: Self-pay

## 2024-02-26 ENCOUNTER — Ambulatory Visit (INDEPENDENT_AMBULATORY_CARE_PROVIDER_SITE_OTHER): Payer: Medicare (Managed Care) | Admitting: Family Medicine

## 2024-02-26 ENCOUNTER — Encounter: Payer: Self-pay | Admitting: Family Medicine

## 2024-02-26 ENCOUNTER — Other Ambulatory Visit: Payer: Self-pay

## 2024-02-26 ENCOUNTER — Other Ambulatory Visit (HOSPITAL_BASED_OUTPATIENT_CLINIC_OR_DEPARTMENT_OTHER): Payer: Self-pay

## 2024-02-26 VITALS — BP 147/78 | HR 82 | Ht 68.0 in | Wt 200.0 lb

## 2024-02-26 DIAGNOSIS — Z2981 Encounter for HIV pre-exposure prophylaxis: Secondary | ICD-10-CM

## 2024-02-26 DIAGNOSIS — Z7984 Long term (current) use of oral hypoglycemic drugs: Secondary | ICD-10-CM

## 2024-02-26 DIAGNOSIS — E119 Type 2 diabetes mellitus without complications: Secondary | ICD-10-CM | POA: Diagnosis not present

## 2024-02-26 DIAGNOSIS — R32 Unspecified urinary incontinence: Secondary | ICD-10-CM | POA: Diagnosis not present

## 2024-02-26 DIAGNOSIS — Z9641 Presence of insulin pump (external) (internal): Secondary | ICD-10-CM | POA: Diagnosis not present

## 2024-02-26 DIAGNOSIS — G4700 Insomnia, unspecified: Secondary | ICD-10-CM | POA: Diagnosis not present

## 2024-02-26 MED ORDER — SOLIFENACIN SUCCINATE 5 MG PO TABS
5.0000 mg | ORAL_TABLET | Freq: Every day | ORAL | 1 refills | Status: AC
  Filled 2024-02-26: qty 90, 90d supply, fill #0
  Filled 2024-07-15: qty 90, 90d supply, fill #1

## 2024-02-26 MED ORDER — DESCOVY 200-25 MG PO TABS
1.0000 | ORAL_TABLET | Freq: Every day | ORAL | 3 refills | Status: AC
  Filled 2024-03-02: qty 30, 30d supply, fill #0

## 2024-02-26 MED ORDER — ESZOPICLONE 2 MG PO TABS
2.0000 mg | ORAL_TABLET | Freq: Every evening | ORAL | 1 refills | Status: AC | PRN
  Filled 2024-02-26: qty 30, 30d supply, fill #0

## 2024-02-27 LAB — URINALYSIS, ROUTINE W REFLEX MICROSCOPIC
Bilirubin, UA: NEGATIVE
Ketones, UA: NEGATIVE
Leukocytes,UA: NEGATIVE
Nitrite, UA: NEGATIVE
RBC, UA: NEGATIVE
Specific Gravity, UA: >=1.03 — AB (ref 1.005–1.030)
Urobilinogen, Ur: 0.2 mg/dL (ref 0.2–1.0)
pH, UA: 6.5 (ref 5.0–7.5)

## 2024-02-27 LAB — PSA: Prostate Specific Ag, Serum: 1.2 ng/mL (ref 0.0–4.0)

## 2024-02-27 LAB — HIV ANTIBODY (ROUTINE TESTING W REFLEX): HIV Screen 4th Generation wRfx: NONREACTIVE

## 2024-02-29 DIAGNOSIS — Z2981 Encounter for HIV pre-exposure prophylaxis: Secondary | ICD-10-CM | POA: Insufficient documentation

## 2024-02-29 DIAGNOSIS — R32 Unspecified urinary incontinence: Secondary | ICD-10-CM | POA: Insufficient documentation

## 2024-02-29 LAB — CHLAMYDIA/GONOCOCCUS/TRICHOMONAS, NAA
Chlamydia by NAA: NEGATIVE
Gonococcus by NAA: NEGATIVE
Trich vag by NAA: NEGATIVE

## 2024-02-29 NOTE — Assessment & Plan Note (Signed)
 Trazodone  is no longer effective.  Will try adding Lunesta .  He will continue use of inspire device [place].

## 2024-02-29 NOTE — Assessment & Plan Note (Addendum)
 This is being managed by endocrinology.  Reports that diabetes has not been under good control.  Encourage dietary changes with incorporation of activity.  Letter provided to avoid driving at night at this time.

## 2024-02-29 NOTE — Assessment & Plan Note (Signed)
 Checking GC chlamydia, urinalysis and PSA.  Uncontrolled blood sugars may be contributing.  Provided a trial of Vesicare .

## 2024-02-29 NOTE — Assessment & Plan Note (Signed)
 We discussed limitations and need for monitoring of prep.  Recommended disco V as he is at risk for renal dysfunction with his uncontrolled diabetes.  Checking HIV antibody and renal function today.

## 2024-02-29 NOTE — Progress Notes (Signed)
 Brandon [name] - 68 y.o. male MRN [id number]  Date of birth: [birth year]  Subjective Chief Complaint  Patient presents with   Hypertension    HPI Brandon [name] is a 68 y.o. male here today for follow up visit.   Continues to see endocrinology for management of diabetes.  Using insulin  health and CGM.  He reports her blood sugars are not been well-controlled recently.  He has been much more liberal with his diet and has not participating in a whole lot of activity.  Patient has been affected some, especially during nighttime.  He is requesting a letter stating that he should avoid driving at night for work.  Overall mood is stable.  He is having some difficulty with sleep.  Trazodone  is not effective at this point.  He did try Lunesta  at 1 point which worked well for him.  He did not have side effects with this.  He is dating him and would like to start PrEP.  He is diligent with condom use.  Having some urinary frequency and urgency.  Getting up several times throughout the night to urinate.  Denies pain with urination or hematuria.  ROS:  A comprehensive ROS was completed and negative except as noted per HPI  Allergies  Allergen Reactions   Kiwi Extract Swelling and Other (See Comments)    Mouth swelling   Other Swelling and Other (See Comments)    EGGPLANT = Mouth swelling   Penicillins Rash   Ancef [Cefazolin] Rash   Latex Rash   Levaquin [Levofloxacin] Rash   Peach [Prunus Persica] Other (See Comments)    These burn the mouth    Past Medical History:  Diagnosis Date   Anxiety    Arthritis    Basal cell carcinoma    Basal cell carcinoma (BCC) in situ of skin    left neck   BCC (basal cell carcinoma of skin) 01/14/2024   On vertex scalp treated with Mohs 12/31/23    Chronic back pain    has spinal cord stimulator and wears buprenorphine  patch   Colon polyps    Coronary artery disease    S/p DES to mRCA in 12/21 // S/p DES to LCx x 2 in 12/2020 // Diff dz in small to  mod Dx (not a good target for PCI); OM1 100 CTO; severe dz in small OM2 and PDA >> Med Rx   Depression    Diabetes mellitus without complication (HCC)    type 2   Fibromyalgia    Gallstones    GERD (gastroesophageal reflux disease)    Hepatitis B    HLD (hyperlipidemia)    Hypertension    Osteoarthritis    Pneumonia    PONV (postoperative nausea and vomiting)    SBO (small bowel obstruction) (HCC)    Sleep apnea    does not use CPAP    Past Surgical History:  Procedure Laterality Date   CARDIAC CATHETERIZATION  01/17/2021   CATARACT EXTRACTION W/ INTRAOCULAR LENS  IMPLANT, BILATERAL     CHOLECYSTECTOMY     CHOLECYSTECTOMY, LAPAROSCOPIC     COLONOSCOPY WITH PROPOFOL  N/A 12/19/2022   Procedure: COLONOSCOPY WITH PROPOFOL ;  Surgeon: Stacia Glendia BRAVO, MD;  Location: [facility] ENDOSCOPY;  Service: Gastroenterology;  Laterality: N/A;   CORONARY STENT INTERVENTION N/A 08/15/2020   Procedure: CORONARY STENT INTERVENTION;  Surgeon: Dann Candyce RAMAN, MD;  Location: [facility] INVASIVE CV LAB;  Service: Cardiovascular;  Laterality: N/A;   CORONARY STENT INTERVENTION N/A 01/17/2021   Procedure:  CORONARY STENT INTERVENTION;  Surgeon: Verlin Lonni BIRCH, MD;  Location: [facility] INVASIVE CV LAB;  Service: Cardiovascular;  Laterality: N/A;   CORONARY ULTRASOUND/IVUS N/A 08/15/2020   Procedure: Intravascular Ultrasound/IVUS;  Surgeon: Dann Candyce RAMAN, MD;  Location: [facility] INVASIVE CV LAB;  Service: Cardiovascular;  Laterality: N/A;   DRUG INDUCED ENDOSCOPY N/A 05/23/2022   Procedure: DRUG INDUCED SLEEP ENDOSCOPY;  Surgeon: Mable Lenis, MD;  Location: [facility] OR;  Service: ENT;  Laterality: N/A;   HERNIA REPAIR     IMPLANTATION OF HYPOGLOSSAL NERVE STIMULATOR Right 07/30/2022   Procedure: IMPLANTATION OF HYPOGLOSSAL NERVE STIMULATOR;  Surgeon: Mable Lenis, MD;  Location: [facility];  Service: ENT;  Laterality: Right;   LEFT HEART CATH AND CORONARY ANGIOGRAPHY N/A 08/15/2020   Procedure:  LEFT HEART CATH AND CORONARY ANGIOGRAPHY;  Surgeon: Dann Candyce RAMAN, MD;  Location: [facility] INVASIVE CV LAB;  Service: Cardiovascular;  Laterality: N/A;   LEFT HEART CATH AND CORONARY ANGIOGRAPHY N/A 01/17/2021   Procedure: LEFT HEART CATH AND CORONARY ANGIOGRAPHY;  Surgeon: Verlin Lonni BIRCH, MD;  Location: [facility] INVASIVE CV LAB;  Service: Cardiovascular;  Laterality: N/A;   LUMBAR FUSION     L3-5 with rods   MICRODISCECTOMY LUMBAR  2006   POLYPECTOMY  12/19/2022   Procedure: POLYPECTOMY;  Surgeon: Stacia Glendia BRAVO, MD;  Location: [facility] ENDOSCOPY;  Service: Gastroenterology;;   SPINAL CORD STIMULATOR INSERTION     blood clot removed from spinal cord   SPINE SURGERY     blood clot removed from spinal cord   XI ROBOTIC ASSISTED INGUINAL HERNIA REPAIR WITH MESH     x 3 surgeries    Social History   Socioeconomic History   Marital status: Married    Spouse name: Not on file   Number of children: 4   Years of education: 16   Highest education level: Not on file  Occupational History   Occupation: disabled/ respiratory therapist  Tobacco Use   Smoking status: Former    Current packs/day: 0.00    Average packs/day: 1 pack/day for 30.0 years (30.0 ttl pk-yrs)    Types: Cigarettes    Start date: 15    Quit date: 2007    Years since quitting: 18.5   Smokeless tobacco: Never  Vaping Use   Vaping status: Never Used  Substance and Sexual Activity   Alcohol use: Not Currently   Drug use: Not Currently   Sexual activity: Yes  Other Topics Concern   Not on file  Social History Narrative   ** Merged History Encounter **       Social Drivers of Health   Financial Resource Strain: Not on file  Food Insecurity: No Food Insecurity (12/16/2023)   Hunger Vital Sign    Worried About Running Out of Food in the Last Year: Never true    Ran Out of Food in the Last Year: Never true  Transportation Needs: No Transportation Needs (12/16/2023)   PRAPARE - Scientist, research (physical sciences) (Medical): No    Lack of Transportation (Non-Medical): No  Physical Activity: Not on file  Stress: Not on file  Social Connections: Moderately Isolated (12/16/2023)   Social Connection and Isolation Panel    Frequency of Communication with Friends and Family: More than three times a week    Frequency of Social Gatherings with Friends and Family: More than three times a week    Attends Religious Services: Never    Database administrator or Organizations: Yes  Attends Banker Meetings: Never    Marital Status: Divorced    Family History  Problem Relation Age of Onset   COPD Mother    Anxiety disorder Mother    Depression Mother    Colon cancer Mother    Cancer Father        mets, origin unknown   Anxiety disorder Father    Depression Father    Coronary artery disease Father    CAD Brother    Leukemia Maternal Grandmother    Multiple sclerosis Daughter    Diabetes Neg Hx     Health Maintenance  Topic Date Due   Medicare Annual Wellness (AWV)  Never done   Zoster Vaccines- Shingrix (1 of 2) Never done   COVID-19 Vaccine (3 - Pfizer risk series) 07/14/2020   OPHTHALMOLOGY EXAM  12/04/2023   Diabetic kidney evaluation - Urine ACR  03/26/2024   Hepatitis C Screening  03/26/2024 (Originally 09/13/1973)   INFLUENZA VACCINE  03/26/2024   HEMOGLOBIN A1C  06/16/2024   FOOT EXAM  11/03/2024   Diabetic kidney evaluation - eGFR measurement  12/16/2024   Colonoscopy  12/18/2032   DTaP/Tdap/Td (3 - Td or Tdap) 09/14/2033   Pneumococcal Vaccine: 50+ Years  Completed   Hepatitis B Vaccines  Aged Out   HPV VACCINES  Aged Out   Meningococcal B Vaccine  Aged Out     ----------------------------------------------------------------------------------------------------------------------------------------------------------------------------------------------------------------- Physical Exam BP (!) 147/78 (BP Location: Left Arm, Patient Position: Sitting, Cuff  Size: Normal)   Pulse 82   Ht 5' 8 (1.727 m)   Wt 200 lb (90.7 kg)   SpO2 97%   BMI 30.41 kg/m   Physical Exam Constitutional:      Appearance: Normal appearance.  Cardiovascular:     Rate and Rhythm: Normal rate and regular rhythm.  Pulmonary:     Effort: Pulmonary effort is normal.     Breath sounds: Normal breath sounds.  Neurological:     Mental Status: He is alert.  Psychiatric:        Mood and Affect: Mood normal.     ------------------------------------------------------------------------------------------------------------------------------------------------------------------------------------------------------------------- Assessment and Plan  Encounter for HIV pre-exposure prophylaxis We discussed limitations and need for monitoring of prep.  Recommended disco V as he is at risk for renal dysfunction with his uncontrolled diabetes.  Checking HIV antibody and renal function today.  Urinary incontinence Checking GC chlamydia, urinalysis and PSA.  Uncontrolled blood sugars may be contributing.  Provided a trial of Vesicare .  Diabetes mellitus type 2, uncomplicated, on long term insulin  pump [facility]) This is being managed by endocrinology.  Reports that diabetes has not been under good control.  Encourage dietary changes with incorporation of activity.  Letter provided to avoid driving at night at this time.  Insomnia Trazodone  is no longer effective.  Will try adding Lunesta .  He will continue use of inspire device [place].   Meds ordered this encounter  Medications   emtricitabine -tenofovir  AF (DESCOVY ) 200-25 MG tablet    Sig: Take 1 tablet by mouth daily.    Dispense:  30 tablet    Refill:  3   solifenacin  (VESICARE ) 5 MG tablet    Sig: Take 1 tablet (5 mg total) by mouth daily.    Dispense:  90 tablet    Refill:  1   eszopiclone  (LUNESTA ) 2 MG TABS tablet    Sig: Take 1 tablet (2 mg total) by mouth at bedtime as needed for sleep. Take immediately before  bedtime    Dispense:  30 tablet    Refill:  1    No follow-ups on file.

## 2024-03-01 ENCOUNTER — Telehealth: Payer: Self-pay

## 2024-03-01 ENCOUNTER — Ambulatory Visit: Admitting: Family Medicine

## 2024-03-01 ENCOUNTER — Telehealth: Payer: Self-pay | Admitting: Family Medicine

## 2024-03-01 NOTE — Telephone Encounter (Signed)
 Left message advising of the letter on MyChart.

## 2024-03-01 NOTE — Telephone Encounter (Unsigned)
 Copied from CRM [id number]. Topic: General - Other >> Mar 01, 2024  2:21 PM Laurier C wrote: Reason for CRM: Patient would like to know if the paperwork he dropped off is available for him to pick up. He would like to be called back if so.  Patient's call back# [phone]

## 2024-03-01 NOTE — Telephone Encounter (Signed)
 Copied from CRM [id number]. Topic: General - Other >> Mar 01, 2024  8:31 AM Cherylann RAMAN wrote: Reason for CRM: Patient states that he was supposed to receive a work restriction letter from Dr. Alvia on 07/03, his last appointment. Please contact patient regarding letter as his employer is attempting to terminate his employment without the letter. Patient can be contacted at [phone].

## 2024-03-02 ENCOUNTER — Other Ambulatory Visit (HOSPITAL_COMMUNITY): Payer: Self-pay

## 2024-03-02 ENCOUNTER — Encounter (HOSPITAL_COMMUNITY): Payer: Self-pay

## 2024-03-03 ENCOUNTER — Ambulatory Visit: Payer: Self-pay | Admitting: Family Medicine

## 2024-03-03 ENCOUNTER — Other Ambulatory Visit (HOSPITAL_COMMUNITY): Payer: Self-pay

## 2024-03-03 NOTE — Progress Notes (Signed)
 Patient will call insurance to enroll in payment plan. Currently looking for new employment and will reach out to us  once he is ready to fill.

## 2024-03-03 NOTE — Progress Notes (Signed)
 Pharmacy Patient Advocate Encounter  Insurance verification completed.   The patient is insured through [facility]  Ran test claim for [place] . Co-pay is $642. Patient eligible for medicare payment plan.  This test claim was processed through [facility]- copay amounts may vary at other pharmacies due to pharmacy/plan contracts, or as the patient moves through the different stages of their insurance plan.

## 2024-03-04 NOTE — Telephone Encounter (Signed)
 A user error has taken place: encounter opened in error, closed for administrative reasons.

## 2024-03-05 ENCOUNTER — Other Ambulatory Visit: Payer: Self-pay

## 2024-03-10 ENCOUNTER — Other Ambulatory Visit (HOSPITAL_BASED_OUTPATIENT_CLINIC_OR_DEPARTMENT_OTHER): Payer: Self-pay

## 2024-03-10 ENCOUNTER — Other Ambulatory Visit: Payer: Self-pay | Admitting: Family Medicine

## 2024-03-10 ENCOUNTER — Encounter (HOSPITAL_BASED_OUTPATIENT_CLINIC_OR_DEPARTMENT_OTHER): Payer: Self-pay

## 2024-03-10 MED ORDER — NYSTATIN 100000 UNIT/ML MT SUSP
5.0000 mL | Freq: Three times a day (TID) | OROMUCOSAL | 1 refills | Status: AC | PRN
  Filled 2024-03-10: qty 200, 14d supply, fill #0

## 2024-03-11 ENCOUNTER — Other Ambulatory Visit (HOSPITAL_BASED_OUTPATIENT_CLINIC_OR_DEPARTMENT_OTHER): Payer: Self-pay

## 2024-03-12 ENCOUNTER — Other Ambulatory Visit: Payer: Self-pay

## 2024-03-15 ENCOUNTER — Other Ambulatory Visit: Payer: Self-pay

## 2024-03-16 ENCOUNTER — Other Ambulatory Visit: Payer: Self-pay

## 2024-03-19 ENCOUNTER — Other Ambulatory Visit: Payer: Self-pay

## 2024-03-23 ENCOUNTER — Other Ambulatory Visit (HOSPITAL_COMMUNITY): Payer: Self-pay

## 2024-03-25 ENCOUNTER — Other Ambulatory Visit (HOSPITAL_BASED_OUTPATIENT_CLINIC_OR_DEPARTMENT_OTHER): Payer: Self-pay

## 2024-03-25 ENCOUNTER — Other Ambulatory Visit: Payer: Self-pay

## 2024-03-25 NOTE — Progress Notes (Signed)
Unable to reach patient. Disenrolling.

## 2024-04-27 ENCOUNTER — Other Ambulatory Visit (HOSPITAL_BASED_OUTPATIENT_CLINIC_OR_DEPARTMENT_OTHER): Payer: Self-pay

## 2024-04-27 ENCOUNTER — Encounter: Payer: Self-pay | Admitting: Sports Medicine

## 2024-05-03 ENCOUNTER — Other Ambulatory Visit (HOSPITAL_BASED_OUTPATIENT_CLINIC_OR_DEPARTMENT_OTHER): Payer: Self-pay

## 2024-05-03 ENCOUNTER — Other Ambulatory Visit: Payer: Self-pay | Admitting: Family Medicine

## 2024-05-03 DIAGNOSIS — E1169 Type 2 diabetes mellitus with other specified complication: Secondary | ICD-10-CM

## 2024-05-03 MED ORDER — FREESTYLE LITE TEST VI STRP
ORAL_STRIP | 12 refills | Status: AC
  Filled 2024-05-03: qty 300, 50d supply, fill #0

## 2024-05-10 ENCOUNTER — Ambulatory Visit (INDEPENDENT_AMBULATORY_CARE_PROVIDER_SITE_OTHER): Payer: Medicare (Managed Care) | Admitting: Family Medicine

## 2024-05-10 ENCOUNTER — Ambulatory Visit: Payer: Self-pay

## 2024-05-10 ENCOUNTER — Encounter: Payer: Self-pay | Admitting: Family Medicine

## 2024-05-10 VITALS — BP 142/75 | HR 93 | Ht 68.0 in | Wt 200.0 lb

## 2024-05-10 DIAGNOSIS — R350 Frequency of micturition: Secondary | ICD-10-CM | POA: Diagnosis not present

## 2024-05-10 DIAGNOSIS — Z794 Long term (current) use of insulin: Secondary | ICD-10-CM

## 2024-05-10 DIAGNOSIS — R32 Unspecified urinary incontinence: Secondary | ICD-10-CM | POA: Diagnosis not present

## 2024-05-10 DIAGNOSIS — Z7984 Long term (current) use of oral hypoglycemic drugs: Secondary | ICD-10-CM

## 2024-05-10 DIAGNOSIS — E1169 Type 2 diabetes mellitus with other specified complication: Secondary | ICD-10-CM

## 2024-05-10 DIAGNOSIS — E785 Hyperlipidemia, unspecified: Secondary | ICD-10-CM | POA: Diagnosis not present

## 2024-05-10 LAB — POCT URINALYSIS DIP (CLINITEK)
Bilirubin, UA: NEGATIVE
Blood, UA: NEGATIVE
Glucose, UA: 500 mg/dL — AB
Ketones, POC UA: NEGATIVE mg/dL
Leukocytes, UA: NEGATIVE
Nitrite, UA: NEGATIVE
POC PROTEIN,UA: NEGATIVE
Spec Grav, UA: <=1.005 — AB (ref 1.010–1.025)
Urobilinogen, UA: 0.2 U/dL
pH, UA: 5.5 (ref 5.0–8.0)

## 2024-05-10 LAB — POCT UA - MICROALBUMIN
Creatinine, POC: 50 mg/dL
Microalbumin Ur, POC: 30 mg/L

## 2024-05-10 NOTE — Telephone Encounter (Signed)
 Patient scheduled.

## 2024-05-10 NOTE — Assessment & Plan Note (Signed)
 Diabetes has been managed by endocrinology, however, he does not wish to return to current endocrinologist.  Updated referral ordered.  Will try to get him samples of basal insulin  until he is seen and can get pump updated.

## 2024-05-10 NOTE — Progress Notes (Signed)
 Brandon [name] - 68 y.o. male MRN [id number]  Date of birth: 05-23-1956  Subjective Chief Complaint  Patient presents with   Urinary Frequency    HPI Brandon [name] is 68 y.o. male here today with complaint of urinary urgency and frequency.    Symptoms started  about 2 weeks ago.  He denies pain with urination   He has been off of his insulin  for a little over a month due to complications with his CGM.  He does not want to continue to see his current endocrinologist.  He can only hold his bladder for 1-1.5 hours at a time.  He is having incontinence if he waits too long.    ROS:  A comprehensive ROS was completed and negative except as noted per HPI  Allergies  Allergen Reactions   Kiwi Extract Swelling and Other (See Comments)    Mouth swelling   Other Swelling and Other (See Comments)    EGGPLANT = Mouth swelling   Penicillins Rash   Ancef [Cefazolin] Rash   Latex Rash   Levaquin [Levofloxacin] Rash   Peach [Prunus Persica] Other (See Comments)    These burn the mouth    Past Medical History:  Diagnosis Date   Anxiety    Arthritis    Basal cell carcinoma    Basal cell carcinoma (BCC) in situ of skin    left neck   BCC (basal cell carcinoma of skin) 01/14/2024   On vertex scalp treated with Mohs 12/31/23    Chronic back pain    has spinal cord stimulator and wears buprenorphine  patch   Colon polyps    Coronary artery disease    S/p DES to mRCA in 12/21 // S/p DES to LCx x 2 in 12/2020 // Diff dz in small to mod Dx (not a good target for PCI); OM1 100 CTO; severe dz in small OM2 and PDA >> Med Rx   Depression    Diabetes mellitus without complication (HCC)    type 2   Fibromyalgia    Gallstones    GERD (gastroesophageal reflux disease)    Hepatitis B    HLD (hyperlipidemia)    Hypertension    Osteoarthritis    Pneumonia    PONV (postoperative nausea and vomiting)    SBO (small bowel obstruction) (HCC)    Sleep apnea    does not use CPAP    Past Surgical  History:  Procedure Laterality Date   CARDIAC CATHETERIZATION  01/17/2021   CATARACT EXTRACTION W/ INTRAOCULAR LENS  IMPLANT, BILATERAL     CHOLECYSTECTOMY     CHOLECYSTECTOMY, LAPAROSCOPIC     COLONOSCOPY WITH PROPOFOL  N/A 12/19/2022   Procedure: COLONOSCOPY WITH PROPOFOL ;  Surgeon: Stacia Glendia BRAVO, MD;  Location: [facility] ENDOSCOPY;  Service: Gastroenterology;  Laterality: N/A;   CORONARY STENT INTERVENTION N/A 08/15/2020   Procedure: CORONARY STENT INTERVENTION;  Surgeon: Dann Candyce RAMAN, MD;  Location: [facility] INVASIVE CV LAB;  Service: Cardiovascular;  Laterality: N/A;   CORONARY STENT INTERVENTION N/A 01/17/2021   Procedure: CORONARY STENT INTERVENTION;  Surgeon: Verlin Lonni BIRCH, MD;  Location: [facility] INVASIVE CV LAB;  Service: Cardiovascular;  Laterality: N/A;   CORONARY ULTRASOUND/IVUS N/A 08/15/2020   Procedure: Intravascular Ultrasound/IVUS;  Surgeon: Dann Candyce RAMAN, MD;  Location: [facility] INVASIVE CV LAB;  Service: Cardiovascular;  Laterality: N/A;   DRUG INDUCED ENDOSCOPY N/A 05/23/2022   Procedure: DRUG INDUCED SLEEP ENDOSCOPY;  Surgeon: Mable Lenis, MD;  Location: [facility] OR;  Service: ENT;  Laterality: N/A;  HERNIA REPAIR     IMPLANTATION OF HYPOGLOSSAL NERVE STIMULATOR Right 07/30/2022   Procedure: IMPLANTATION OF HYPOGLOSSAL NERVE STIMULATOR;  Surgeon: Mable Lenis, MD;  Location: [facility];  Service: ENT;  Laterality: Right;   LEFT HEART CATH AND CORONARY ANGIOGRAPHY N/A 08/15/2020   Procedure: LEFT HEART CATH AND CORONARY ANGIOGRAPHY;  Surgeon: Dann Candyce RAMAN, MD;  Location: [facility] INVASIVE CV LAB;  Service: Cardiovascular;  Laterality: N/A;   LEFT HEART CATH AND CORONARY ANGIOGRAPHY N/A 01/17/2021   Procedure: LEFT HEART CATH AND CORONARY ANGIOGRAPHY;  Surgeon: Verlin Lonni BIRCH, MD;  Location: [facility] INVASIVE CV LAB;  Service: Cardiovascular;  Laterality: N/A;   LUMBAR FUSION     L3-5 with rods   MICRODISCECTOMY LUMBAR  2006   POLYPECTOMY   12/19/2022   Procedure: POLYPECTOMY;  Surgeon: Stacia Glendia BRAVO, MD;  Location: [facility] ENDOSCOPY;  Service: Gastroenterology;;   SPINAL CORD STIMULATOR INSERTION     blood clot removed from spinal cord   SPINE SURGERY     blood clot removed from spinal cord   XI ROBOTIC ASSISTED INGUINAL HERNIA REPAIR WITH MESH     x 3 surgeries    Social History   Socioeconomic History   Marital status: Married    Spouse name: Not on file   Number of children: 4   Years of education: 16   Highest education level: Not on file  Occupational History   Occupation: disabled/ respiratory therapist  Tobacco Use   Smoking status: Former    Current packs/day: 0.00    Average packs/day: 1 pack/day for 30.0 years (30.0 ttl pk-yrs)    Types: Cigarettes    Start date: 73    Quit date: 2007    Years since quitting: 18.7   Smokeless tobacco: Never  Vaping Use   Vaping status: Never Used  Substance and Sexual Activity   Alcohol use: Not Currently   Drug use: Not Currently   Sexual activity: Yes  Other Topics Concern   Not on file  Social History Narrative   ** Merged History Encounter **       Social Drivers of Health   Financial Resource Strain: Not on file  Food Insecurity: No Food Insecurity (12/16/2023)   Hunger Vital Sign    Worried About Running Out of Food in the Last Year: Never true    Ran Out of Food in the Last Year: Never true  Transportation Needs: No Transportation Needs (12/16/2023)   PRAPARE - Administrator, Civil Service (Medical): No    Lack of Transportation (Non-Medical): No  Physical Activity: Not on file  Stress: Not on file  Social Connections: Moderately Isolated (12/16/2023)   Social Connection and Isolation Panel    Frequency of Communication with Friends and Family: More than three times a week    Frequency of Social Gatherings with Friends and Family: More than three times a week    Attends Religious Services: Never    Database administrator or  Organizations: Yes    Attends Banker Meetings: Never    Marital Status: Divorced    Family History  Problem Relation Age of Onset   COPD Mother    Anxiety disorder Mother    Depression Mother    Colon cancer Mother    Cancer Father        mets, origin unknown   Anxiety disorder Father    Depression Father    Coronary artery disease Father  CAD Brother    Leukemia Maternal Grandmother    Multiple sclerosis Daughter    Diabetes Neg Hx     Health Maintenance  Topic Date Due   Medicare Annual Wellness (AWV)  Never done   Hepatitis C Screening  Never done   Zoster Vaccines- Shingrix (1 of 2) Never done   COVID-19 Vaccine (3 - Pfizer risk series) 07/14/2020   OPHTHALMOLOGY EXAM  12/04/2023   Influenza Vaccine  03/26/2024   HEMOGLOBIN A1C  06/16/2024   FOOT EXAM  11/03/2024   Diabetic kidney evaluation - eGFR measurement  12/16/2024   Diabetic kidney evaluation - Urine ACR  05/10/2025   Colonoscopy  12/18/2032   DTaP/Tdap/Td (3 - Td or Tdap) 09/14/2033   Pneumococcal Vaccine: 50+ Years  Completed   HPV VACCINES  Aged Out   Meningococcal B Vaccine  Aged Out     ----------------------------------------------------------------------------------------------------------------------------------------------------------------------------------------------------------------- Physical Exam BP (!) 142/75 (BP Location: Left Arm, Patient Position: Sitting, Cuff Size: Normal)   Pulse 93   Ht 5' 8 (1.727 m)   Wt 200 lb (90.7 kg)   SpO2 99%   BMI 30.41 kg/m   Physical Exam Constitutional:      Appearance: Normal appearance.  Cardiovascular:     Rate and Rhythm: Normal rate and regular rhythm.  Pulmonary:     Effort: Pulmonary effort is normal.     Breath sounds: Normal breath sounds.  Neurological:     General: No focal deficit present.     Mental Status: He is alert.  Psychiatric:        Mood and Affect: Mood normal.        Behavior: Behavior normal.      ------------------------------------------------------------------------------------------------------------------------------------------------------------------------------------------------------------------- Assessment and Plan  Urinary frequency Urinary frequency is likely related to uncontrolled diabetes.  UA with glucose but no signs of infection.  Checking PSA today.   Hyperlipidemia associated with type 2 diabetes mellitus (HCC) Diabetes has been managed by endocrinology, however, he does not wish to return to current endocrinologist.  Updated referral ordered.  Will try to get him samples of basal insulin  until he is seen and can get pump updated.    No orders of the defined types were placed in this encounter.   No follow-ups on file.

## 2024-05-10 NOTE — Assessment & Plan Note (Signed)
 Urinary frequency is likely related to uncontrolled diabetes.  UA with glucose but no signs of infection.  Checking PSA today.

## 2024-05-10 NOTE — Telephone Encounter (Signed)
 FYI Only or Action Required?: FYI only for provider.  Patient was last seen in primary care on 02/26/2024 by Alvia Bring, DO.  Called Nurse Triage reporting Urinary Frequency.  Symptoms began several weeks ago.  Interventions attempted: Nothing.  Symptoms are: gradually worsening.  Triage Disposition: See Physician Within 24 Hours  Patient/caregiver understands and will follow disposition?: Yes    Copied from CRM #[id number]. Topic: Clinical - Red Word Triage >> May 10, 2024  8:19 AM Brandon [name] wrote: Red Word that prompted transfer to Nurse Triage: urinary incontinence Diabetic/Everytime sees bathroom has to go. Reason for Disposition  Urinating more frequently than usual (i.e., frequency) OR new-onset of the feeling of an urgent need to urinate (i.e., urgency)  Answer Assessment - Initial Assessment Questions 1. SYMPTOM: What's the main symptom you're concerned about? (e.g., frequency, incontinence)     Urinary incontinence, increased urination, abnormal glucose levels 2. ONSET: When did the  symptoms  start?     A few weeks ago but worsening over last few days 3. PAIN: Is there any pain? If Yes, ask: How bad is it? (Scale: 1-10; mild, moderate, severe)     No 4. CAUSE: What do you think is causing the symptoms?     Patient is unsure, states he does not normally have incontinence issues but they are getting worse. Patient states his glucose has been out of whack because he has not been using his insulin  pump due to a bruise on his stomach. He states his glucose has been running in the 200s.  5. OTHER SYMPTOMS: Do you have any other symptoms? (e.g., blood in urine, fever, flank pain, pain with urination)     None - patient denies blood in urine, pain with urination, fever, back pain  Protocols used: Urinary Symptoms-A-AH

## 2024-05-11 ENCOUNTER — Other Ambulatory Visit: Payer: Self-pay | Admitting: Family Medicine

## 2024-05-11 ENCOUNTER — Ambulatory Visit: Payer: Self-pay | Admitting: Family Medicine

## 2024-05-11 ENCOUNTER — Other Ambulatory Visit (HOSPITAL_BASED_OUTPATIENT_CLINIC_OR_DEPARTMENT_OTHER): Payer: Self-pay

## 2024-05-11 LAB — PSA: Prostate Specific Ag, Serum: 1 ng/mL (ref 0.0–4.0)

## 2024-05-11 MED ORDER — INSULIN DEGLUDEC 100 UNIT/ML ~~LOC~~ SOPN
25.0000 [IU] | PEN_INJECTOR | Freq: Every day | SUBCUTANEOUS | 0 refills | Status: DC
  Filled 2024-05-11: qty 9, 30d supply, fill #0

## 2024-05-12 ENCOUNTER — Other Ambulatory Visit (HOSPITAL_BASED_OUTPATIENT_CLINIC_OR_DEPARTMENT_OTHER): Payer: Self-pay

## 2024-05-13 ENCOUNTER — Encounter: Payer: Self-pay | Admitting: Internal Medicine

## 2024-05-13 ENCOUNTER — Ambulatory Visit: Payer: Medicare (Managed Care) | Attending: Internal Medicine | Admitting: Internal Medicine

## 2024-05-13 VITALS — BP 110/58 | HR 81 | Ht 68.0 in | Wt 201.0 lb

## 2024-05-13 DIAGNOSIS — I25119 Atherosclerotic heart disease of native coronary artery with unspecified angina pectoris: Secondary | ICD-10-CM

## 2024-05-13 DIAGNOSIS — I5189 Other ill-defined heart diseases: Secondary | ICD-10-CM

## 2024-05-13 DIAGNOSIS — I700 Atherosclerosis of aorta: Secondary | ICD-10-CM

## 2024-05-13 DIAGNOSIS — E785 Hyperlipidemia, unspecified: Secondary | ICD-10-CM

## 2024-05-13 DIAGNOSIS — Z794 Long term (current) use of insulin: Secondary | ICD-10-CM

## 2024-05-13 DIAGNOSIS — I152 Hypertension secondary to endocrine disorders: Secondary | ICD-10-CM

## 2024-05-13 DIAGNOSIS — E1169 Type 2 diabetes mellitus with other specified complication: Secondary | ICD-10-CM

## 2024-05-13 DIAGNOSIS — Z6831 Body mass index (BMI) 31.0-31.9, adult: Secondary | ICD-10-CM

## 2024-05-13 DIAGNOSIS — E119 Type 2 diabetes mellitus without complications: Secondary | ICD-10-CM | POA: Diagnosis not present

## 2024-05-13 DIAGNOSIS — E1159 Type 2 diabetes mellitus with other circulatory complications: Secondary | ICD-10-CM | POA: Diagnosis not present

## 2024-05-13 NOTE — Progress Notes (Signed)
 Cardiology Office Note:   Date:  05/13/2024  ID:  Brandon [name], DOB 01-19-1956, MRN [id number] PCP:  Alvia Bring, DO  [facility] Providers Cardiologist:  Wendel Haws, MD Referring MD: Alvia Bring, DO  Chief Complaint/Reason for Referral: Follow-up for coronary artery disease ASSESSMENT:    1. Coronary artery disease involving native coronary artery of native heart with angina pectoris (HCC)   2. Diastolic dysfunction   3. Type 2 diabetes mellitus without complication, with long-term current use of insulin  (HCC)   4. Hypertension associated with diabetes (HCC)   5. Hyperlipidemia associated with type 2 diabetes mellitus (HCC)   6. Aortic atherosclerosis (HCC)   7. BMI 31.0-31.9,adult     PLAN:   In order of problems listed above: CAD: Continue Plavix  75 mg, atorvastatin  80 mg, as needed nitroglycerin .  Diastolic dysfunction: Continue Jardiance  25 mg.  Consider ARB in the future T2DM: Continue Plavix  75 mg, Jardiance  25 mg, atorvastatin  80 mg.  Consider ARB in the future Hypertension: BP is controlled; not on any medical therapy.  If develops hypertension consider ARB. Hyperlipidemia: Continue atorvastatin  80 mg.  Check lipid panel, LFTs, LP(a) today.  His cramping may be from atorvastatin .  I have asked him to stop taking it for a week to see if this improves his symptoms.  If it does I have asked him to restart the medication and if his symptoms recur let us  know and we will change him to Crestor. Aortic atherosclerosis: Continue Plavix  75 mg, atorvastatin  80 mg. Elevated BMI: Deferred GLP-1 receptor agonist given recurrent small bowel obstruction history            Dispo:  Return in about 6 months (around 11/10/2024).       I spent 35 minutes reviewing all clinical data during and prior to this visit including all relevant imaging studies, laboratories, clinical information from other health systems and prior notes from both Cardiology and other specialties,  interviewing the patient, conducting a complete physical examination, and coordinating care in order to formulate a comprehensive and personalized evaluation and treatment plan.   History of Present Illness:    FOCUSED PROBLEM LIST:   Coronary artery disease UA >> S/p DES to mRCA in 12/21 Residual small vessel dz in RPDA, OM1, OM2, D1 and apical LAD; pLCx 75 (target for PCI if +Symptoms) Chest pain post PCI >> tx briefly with Colchicine  but later [facility] due to no symptoms Dyspnea >>S/p 3.5 x 15 mm and 2.25 x 8 mm DES to LCx x 2 in 12/2020 mRCA stent patent; diff dz in small to mod Dx (not a good target for PCI); OM1 100 CTO; severe dz in small OM2 and PDA >> Med Rx TTE 08/13/20: EF 60-65, no RWMA, mild LVH, Gr 1 DD  TTE 11/01/21: EF 60-65, no RWMA, GR 1 DD, normal RVSF, RAP 3 Hypertension Hyperlipidemia Aortic atherosclerosis CT abdomen pelvis 2024 Diabetes mellitus On insulin  Intolerant of GLP1RA 2/2 recurrent SBOs OSA s/p Inspire BMI 31 On pre-exposure prophylaxis On Descovy   September 2025:  Patient consents to use of AI scribe. The patient returns for routine follow-up.  He was last seen in August 2024.  His Imdur  was reduced to 15 mg and metoprolol  stopped due to reports of low blood pressure.  His Imdur  was later stopped due to symptomatic low blood pressure.  No current chest pain, shortness of breath, lightheadedness, or leg swelling. His blood pressure medications were adjusted in August of the previous year due to low blood  pressure, leading to the cessation of metoprolol  and Imdur .  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 The cramps have persisted despite not working for the past few weeks. He takes over-the-counter magnesium , two tablets every morning, which helps alleviate the cramps. He is currently taking Lipitor  for cholesterol management.  He has a history of small bowel  obstructions, which led to discontinuation of Ozempic  and MyJaro due to concerns about potential infections.     Current Medications: Current Meds  Medication Sig   acetaminophen  (TYLENOL ) 500 MG tablet Take 500-1,000 mg by mouth every 6 (six) hours as needed (for pain or headaches).   albuterol  (VENTOLIN  HFA) 108 (90 Base) MCG/ACT inhaler Inhale 2 puffs into the lungs every 6 (six) hours as needed for wheezing or shortness of breath (OR WHEN SICK).   AMBULATORY NON FORMULARY MEDICATION Please provide home oxygen  for nocturnal use.  Apply 2-4L of O2 via nasal cannula at bedtime.  Dx: Nocturnal Oxygen  Desaturation G47.34   AMBULATORY NON FORMULARY MEDICATION Please provide humidifier for home oxygen  due to nasal irritation with use.   ammonium lactate  (AMLACTIN) 12 % cream Apply to the affected area(s) topically as needed for dry skin.   ARIPiprazole  (ABILIFY ) 10 MG tablet Take 1 tablet (10 mg total) by mouth every morning.   atorvastatin  (LIPITOR ) 80 MG tablet Take 1 tablet (80 mg total) by mouth daily.   b complex vitamins capsule Take 1 capsule by mouth in the morning.   buPROPion  (WELLBUTRIN  XL) 150 MG 24 hr tablet Take 1 tablet (150 mg total) by mouth daily in addition to 300mg  tablet.   buPROPion  (WELLBUTRIN  XL) 300 MG 24 hr tablet Take 1 tablet (300 mg total) by mouth daily.   clopidogrel  (PLAVIX ) 75 MG tablet Take 1 tablet (75 mg total) by mouth daily with breakfast.   Continuous Glucose Sensor (DEXCOM G7 SENSOR) MISC Inject 1 Device into the skin See admin instructions. Place 1 new sensor into the skin every 10 days   docusate sodium  (COLACE) 100 MG capsule Take 100 mg by mouth 2 (two) times daily as needed for mild constipation.   empagliflozin  (JARDIANCE ) 25 MG TABS tablet Take 1 tablet (25 mg total) by mouth daily.   emtricitabine -tenofovir  AF (DESCOVY ) 200-25 MG tablet Take 1 tablet by mouth daily.   eszopiclone  (LUNESTA ) 2 MG TABS tablet Take 1 tablet (2 mg total) by mouth at  bedtime as needed for sleep. Take immediately before bedtime   glucose blood (FREESTYLE LITE) test strip USE 1 TO CHECK BLOOD GLUCOSE UP TO 5 TIMES DAILY.   GVOKE HYPOPEN  1-PACK 1 MG/0.2ML SOAJ Use as needed for hypoglycemia   insulin  degludec (TRESIBA ) 100 UNIT/ML FlexTouch Pen Inject 25 Units into the skin daily.   insulin  lispro (HUMALOG ) 100 UNIT/ML injection For use in pump, total of 60 units per day   Iron , Ferrous Sulfate , 325 (65 Fe) MG TABS Take 1 tablet (325 mg) by mouth daily.   magic mouthwash (nystatin , lidocaine , diphenhydrAMINE, alum & mag hydroxide) suspension Swish and spit 5 mLs 3 (three) times daily as needed for mouth pain.   magnesium  oxide (MAG-OX) 400 MG tablet Take 2 tablets (800 mg total) by mouth daily.   metoCLOPramide  (REGLAN ) 10 MG tablet Take 1 tablet (10 mg total) by mouth daily as needed for nausea or vomiting (upset stomach).   Multiple Vitamin (MULTI-VITAMIN) tablet Take 1 tablet by mouth  daily.   mupirocin  ointment (BACTROBAN ) 2 % Apply 1 Application topically 2 (two) times daily. (Patient taking differently: Apply 1 Application topically See admin instructions. Apply to affected area of the left ankle 2 times a day)   nitroGLYCERIN  (NITROSTAT ) 0.4 MG SL tablet Place 0.4 mg under the tongue every 5 (five) minutes x 3 doses as needed for chest pain.   ondansetron  (ZOFRAN -ODT) 4 MG disintegrating tablet Take 1 tablet (4 mg total) by mouth every 8 (eight) hours as needed for nausea or vomiting. (Patient taking differently: Take 4 mg by mouth every 8 (eight) hours as needed for nausea or vomiting (dissolve orally).)   oxyCODONE  (OXY IR/ROXICODONE ) 5 MG immediate release tablet Take 1 tablet (5 mg total) by mouth every 4 (four) hours as needed for severe pain (pain score 7-10).   OXYGEN  Inhale 3 L/min into the lungs at bedtime.   pantoprazole  (PROTONIX ) 40 MG tablet Take 1 tablet (40 mg total) by mouth daily.   polyethylene glycol powder (GLYCOLAX /MIRALAX ) 17 GM/SCOOP  powder Take 17 g by mouth daily as needed for mild constipation (mix as directed).   solifenacin  (VESICARE ) 5 MG tablet Take 1 tablet (5 mg total) by mouth daily.   tamsulosin  (FLOMAX ) 0.4 MG CAPS capsule Take 1 capsule (0.4 mg total) by mouth daily.   testosterone  (ANDROGEL ) 50 MG/5GM (1%) GEL Place 5 g (1 packet) onto the shoulders and upper arms daily as directed.   tiZANidine  (ZANAFLEX ) 4 MG tablet Take 1-1&1/2 tablets (4-6 mg total) by mouth every 8 (eight) hours as needed for muscle spasms.     Review of Systems:   Please see the history of present illness.    All other systems reviewed and are negative.     EKGs/Labs/Other Test Reviewed:   EKG: 2024 sinus rhythm early R wave progression  EKG Interpretation Date/Time:  Thursday May 13 2024 10:43:38 EDT Ventricular Rate:  80 PR Interval:  158 QRS Duration:  98 QT Interval:  398 QTC Calculation: 459 R Axis:   4  Text Interpretation: Normal sinus rhythm Minimal voltage criteria for LVH, may be normal variant ( R in aVL ) When compared with ECG of 08-Jun-2023 07:30, PREVIOUS ECG IS PRESENT Confirmed by Wendel Haws ([id number]) on 05/13/2024 10:48:54 AM        CARDIAC STUDIES: Refer to CV Procedures and Imaging Tabs   Risk Assessment/Calculations:          Physical Exam:   VS:  BP (!) 110/58   Pulse 81   Ht 5' 8 (1.727 m)   Wt 201 lb (91.2 kg)   SpO2 93%   BMI 30.56 kg/m        Wt Readings from Last 3 Encounters:  05/13/24 201 lb (91.2 kg)  05/10/24 200 lb (90.7 kg)  02/26/24 200 lb (90.7 kg)      GENERAL:  No apparent distress, AOx3 HEENT:  No carotid bruits, +2 carotid impulses, no scleral icterus CAR: RRR no murmurs, gallops, rubs, or thrills RES:  Clear to auscultation bilaterally ABD:  Soft, nontender, nondistended, positive bowel sounds x 4 VASC:  +2 radial pulses, +2 carotid pulses NEURO:  CN 2-12 grossly intact; motor and sensory grossly intact PSYCH:  No active depression or anxiety EXT:  No  edema, ecchymosis, or cyanosis  Signed, Lachrista Heslin K Karianna Gusman, MD  05/13/2024 11:42 AM    [facility] [address] [place], [place], [place]  [address] Phone: [phone]; Fax: [phone]   Note:  This document was prepared using Dragon voice recognition software and may include unintentional dictation errors.

## 2024-05-13 NOTE — Patient Instructions (Signed)
 Medication Instructions:  No medication changes were made at this visit. Continue current regimen.   *If you need a refill on your cardiac medications before your next appointment, please call your pharmacy*  Lab Work: To be completed today: lipid panel, LFT, and lipoprotein-a  If you have labs (blood work) drawn today and your tests are completely normal, you will receive your results only by: MyChart Message (if you have MyChart) OR A paper copy in the mail If you have any lab test that is abnormal or we need to change your treatment, we will call you to review the results.  Testing/Procedures: None ordered today.  Follow-Up: At [facility], you and your health needs are our priority.  As part of our continuing mission to provide you with exceptional heart care, our providers are all part of one team.  This team includes your primary Cardiologist (physician) and Advanced Practice Providers or APPs (Physician Assistants and Nurse Practitioners) who all work together to provide you with the care you need, when you need it.  Your next appointment:   6 month(s)  Provider:   Arun Thukkani, MD

## 2024-05-14 ENCOUNTER — Ambulatory Visit: Payer: Self-pay | Admitting: Internal Medicine

## 2024-05-14 LAB — HEPATIC FUNCTION PANEL
ALT: 14 IU/L (ref 0–44)
AST: 21 IU/L (ref 0–40)
Albumin: 4 g/dL (ref 3.9–4.9)
Alkaline Phosphatase: 87 IU/L (ref 47–123)
Bilirubin Total: 0.5 mg/dL (ref 0.0–1.2)
Bilirubin, Direct: 0.18 mg/dL (ref 0.00–0.40)
Total Protein: 6.3 g/dL (ref 6.0–8.5)

## 2024-05-14 LAB — LIPID PANEL
Chol/HDL Ratio: 3.6 ratio (ref 0.0–5.0)
Cholesterol, Total: 138 mg/dL (ref 100–199)
HDL: 38 mg/dL — ABNORMAL LOW (ref >39)
LDL Chol Calc (NIH): 66 mg/dL (ref 0–99)
Triglycerides: 205 mg/dL — ABNORMAL HIGH (ref 0–149)
VLDL Cholesterol Cal: 34 mg/dL (ref 5–40)

## 2024-05-14 LAB — LIPOPROTEIN A (LPA): Lipoprotein (a): <8.4 nmol/L (ref <75.0)

## 2024-05-18 ENCOUNTER — Encounter: Payer: Self-pay | Admitting: Family Medicine

## 2024-05-18 DIAGNOSIS — R2681 Unsteadiness on feet: Secondary | ICD-10-CM

## 2024-05-20 ENCOUNTER — Ambulatory Visit
Payer: Medicare (Managed Care) | Attending: Family Medicine | Admitting: Rehabilitative and Restorative Service Providers"

## 2024-05-24 ENCOUNTER — Encounter: Payer: Self-pay | Admitting: Internal Medicine

## 2024-05-26 NOTE — Therapy (Signed)
 OUTPATIENT PHYSICAL THERAPY EVALUATION   Patient Name: Brandon [name] MRN: [id number] DOB:07/19/1956, 68 y.o., male Today's Date: 05/27/2024  END OF SESSION:  PT End of Session - 05/27/24 1448     Visit Number 1    Number of Visits 17    Date for Recertification  07/22/24    Authorization Type cign[employer] medicare    Progress Note Due on Visit 10    PT Start Time 1448    PT Stop Time 1538    PT Time Calculation (min) 50 min          Past Medical History:  Diagnosis Date   Anxiety    Arthritis    Basal cell carcinoma    Basal cell carcinoma (BCC) in situ of skin    left neck   BCC (basal cell carcinoma of skin) 01/14/2024   On vertex scalp treated with Mohs 12/31/23    Chronic back pain    has spinal cord stimulator and wears buprenorphine  patch   Colon polyps    Coronary artery disease    S/p DES to mRCA in 12/21 // S/p DES to LCx x 2 in 12/2020 // Diff dz in small to mod Dx (not a good target for PCI); OM1 100 CTO; severe dz in small OM2 and PDA >> Med Rx   Depression    Diabetes mellitus without complication (HCC)    type 2   Fibromyalgia    Gallstones    GERD (gastroesophageal reflux disease)    Hepatitis B    HLD (hyperlipidemia)    Hypertension    Osteoarthritis    Pneumonia    PONV (postoperative nausea and vomiting)    SBO (small bowel obstruction) (HCC)    Sleep apnea    does not use CPAP   Past Surgical History:  Procedure Laterality Date   CARDIAC CATHETERIZATION  01/17/2021   CATARACT EXTRACTION W/ INTRAOCULAR LENS  IMPLANT, BILATERAL     CHOLECYSTECTOMY     CHOLECYSTECTOMY, LAPAROSCOPIC     COLONOSCOPY WITH PROPOFOL  N/A 12/19/2022   Procedure: COLONOSCOPY WITH PROPOFOL ;  Surgeon: Stacia Glendia BRAVO, MD;  Location: [facility] ENDOSCOPY;  Service: Gastroenterology;  Laterality: N/A;   CORONARY STENT INTERVENTION N/A 08/15/2020   Procedure: CORONARY STENT INTERVENTION;  Surgeon: Dann Candyce RAMAN, MD;  Location: [facility] INVASIVE CV LAB;  Service: Cardiovascular;   Laterality: N/A;   CORONARY STENT INTERVENTION N/A 01/17/2021   Procedure: CORONARY STENT INTERVENTION;  Surgeon: Verlin Lonni BIRCH, MD;  Location: [facility] INVASIVE CV LAB;  Service: Cardiovascular;  Laterality: N/A;   CORONARY ULTRASOUND/IVUS N/A 08/15/2020   Procedure: Intravascular Ultrasound/IVUS;  Surgeon: Dann Candyce RAMAN, MD;  Location: [facility] INVASIVE CV LAB;  Service: Cardiovascular;  Laterality: N/A;   DRUG INDUCED ENDOSCOPY N/A 05/23/2022   Procedure: DRUG INDUCED SLEEP ENDOSCOPY;  Surgeon: Mable Lenis, MD;  Location: [facility] OR;  Service: ENT;  Laterality: N/A;   HERNIA REPAIR     IMPLANTATION OF HYPOGLOSSAL NERVE STIMULATOR Right 07/30/2022   Procedure: IMPLANTATION OF HYPOGLOSSAL NERVE STIMULATOR;  Surgeon: Mable Lenis, MD;  Location: [facility];  Service: ENT;  Laterality: Right;   LEFT HEART CATH AND CORONARY ANGIOGRAPHY N/A 08/15/2020   Procedure: LEFT HEART CATH AND CORONARY ANGIOGRAPHY;  Surgeon: Dann Candyce RAMAN, MD;  Location: [facility] INVASIVE CV LAB;  Service: Cardiovascular;  Laterality: N/A;   LEFT HEART CATH AND CORONARY ANGIOGRAPHY N/A 01/17/2021   Procedure: LEFT HEART CATH AND CORONARY ANGIOGRAPHY;  Surgeon: Verlin Lonni BIRCH, MD;  Location: [facility]  INVASIVE CV LAB;  Service: Cardiovascular;  Laterality: N/A;   LUMBAR FUSION     L3-5 with rods   MICRODISCECTOMY LUMBAR  2006   POLYPECTOMY  12/19/2022   Procedure: POLYPECTOMY;  Surgeon: Stacia Glendia BRAVO, MD;  Location: [facility] ENDOSCOPY;  Service: Gastroenterology;;   SPINAL CORD STIMULATOR INSERTION     blood clot removed from spinal cord   SPINE SURGERY     blood clot removed from spinal cord   XI ROBOTIC ASSISTED INGUINAL HERNIA REPAIR WITH MESH     x 3 surgeries   Patient Active Problem List   Diagnosis Date Noted   Urinary frequency 05/10/2024   Urinary incontinence 02/29/2024   Encounter for HIV pre-exposure prophylaxis 02/29/2024   BCC (basal cell carcinoma of skin) 01/14/2024    Cellulitis of left lower extremity 12/16/2023   Injury of face 10/30/2023   Fall 09/17/2023   Right wrist pain 09/17/2023   Acute pain of right knee 09/17/2023   Right facial pain 09/17/2023   Laceration of right eyebrow 09/17/2023   Sore throat 08/08/2023   Pre-ulcerative corn or callous 07/29/2023   Morbid obesity (HCC) 07/29/2023   Hypokalemia 06/08/2023   Anxiety and depression 06/08/2023   Chronic back pain 06/08/2023   BPH (benign prostatic hyperplasia) 06/08/2023   GERD (gastroesophageal reflux disease) 06/08/2023   History of colonic polyps 12/19/2022   Family history of colorectal cancer 12/19/2022   Nocturnal oxygen  desaturation 11/06/2022   Ileus (HCC) 07/12/2022   Radicular pain in right arm 05/02/2022   Neuroforaminal stenosis of cervical spine 05/02/2022   Well adult exam 03/26/2022   Partial small bowel obstruction (HCC) 01/09/2022   Small bowel obstruction (HCC) 12/30/2021   AMS (altered mental status) 11/01/2021   Diabetes mellitus (HCC) 11/01/2021   Community acquired pneumonia 10/31/2021   Coughing 10/29/2021   Pain management contract signed 10/11/2021   History of lumbar fusion (L4/5) 10/02/2021   Chronic pain syndrome 06/01/2021   Obstructive sleep apnea 05/28/2021   Metallic taste 05/21/2021   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   Acute pain of left shoulder 04/03/2021   Unstable angina (HCC)    Insomnia 09/03/2020   Status post coronary artery stent placement    Mixed hyperlipidemia    Coronary artery disease    Chest pain 08/11/2020   Lumbar facet arthropathy 06/22/2020   Major depression in partial remission 06/04/2020   Hyperlipidemia associated with type 2 diabetes mellitus (HCC) 04/14/2020   Primary hypertension 04/07/2020   Diabetes mellitus type 2, uncomplicated, on long term insulin  pump (HCC) 04/07/2020   Spinal cord stimulator status 05/19/2017   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   Erectile dysfunction 10/09/2015    PCP: Alvia Bring,  DO  REFERRING PROVIDER: Alvia Bring, DO  REFERRING DIAG: R26.81 (ICD-10-CM) - Gait instability  Rationale for Evaluation and Treatment: Rehabilitation  THERAPY DIAG:  Unsteadiness on feet  Muscle weakness (generalized)  ONSET DATE: last couple years  SUBJECTIVE:  SUBJECTIVE STATEMENT: Pt endorses ongoing issues w/ balance over last couple of years without clear precipitating factor. He endorses worsening over past six months with 1-2 near falls every day, but no recent falls. He states his legs generally feel weak, particularly in the mornings and after prolonged positioning, with tendency to have trouble picking up his feet. He also reports difficulty w/ bed and chair transfers, feels like sometimes he will just keep going and has difficulty righting himself. He endorses difficulty with turns, unlevel ground. Has to be careful when carrying items. Also has a dog and notes that between dog toys and the dog moving around he will sometimes nearly trip. He does report a history of L sided LE nerve issues for which he used to use a cane.  He denies any dizziness, visual changes. He denies LE N/T, although he does note sometimes he will get LUE numbness when he turns his head. He denies bowel/bladder symptoms, fevers/chills, or recent unexplained weight loss. Does report some variable BP (both high and low) but states he is managing this with PCP.    PERTINENT HISTORY:   anxiety/depression, basal cell carcinoma, chronic back pain w/ spinal cord stimulator, CAD s/p DES, DM2, fibromyalgia, HTN, hep B, hx SBO, hx lumbar fusion, sleep apnea, hx urinary incontinence  PAIN:  Denies any significant issues w/ pain  PRECAUTIONS: cardiac hx, spinal fusion and spinal cord stimulator.  implanted sleep apnea device (per  pt report)    RED FLAGS: None   WEIGHT BEARING RESTRICTIONS: No  FALLS:  Has patient fallen in last 6 months? No - has had near falls frequently (1-2 a day) recently  LIVING ENVIRONMENT: Apartment, has dog, no STE Grab bar in shower, no other equipment  OCCUPATION: starting a new job at [employer] soon. retired respiratory therapist for pediatric critical care  PLOF: Independent  PATIENT GOALS: better strength in legs, ways to maintain balance   NEXT MD VISIT: 2 weeks   OBJECTIVE:  Note: Objective measures were completed at Evaluation unless otherwise noted.  DIAGNOSTIC FINDINGS:  No recent imaging in chart  PATIENT SURVEYS:  ABC: 8.7/16, 54%   COGNITION: Overall cognitive status: Within functional limits for tasks assessed     SENSATION/NEURO: Light touch intact BIL LE Negative hoffmann and tromner sign BIL No ataxia with gait FMC intact BIL UE Heel<>shin affected by weakness, no ataxia noted, symmetrical BIL   STRENGTH TESTING:  MMT Right eval Left eval  Shoulder flexion    Shoulder abduction    Elbow flexion    Elbow extension    Grip strength (gross)    Hip flexion 4- 4-  Hip abduction (modified sitting) 4+ 4+  Knee flexion 4 4-  Knee extension 4 4-  Ankle dorsiflexion 4 4  Ankle plantarflexion     (Blank rows = not tested) (Key: WFL = within functional limits not formally assessed, * = concordant pain, s = stiffness/stretching sensation, NT = not tested)  Comments:     FUNCTIONAL TESTS:   FUNCTIONAL GAIT ASSESSMENT:  ITEM EVAL  1 Gait Level Surface Normal 3   2 Change in Gait Speed mild impairment 2   3 Gait with Horizontal Head Turns Normal 3   4 Gait with Vertical Head Turns Normal 3   5 Gait with Pivot Turn mild impairment 2  6 Step Over Obstacle moderate impairment 1    7 Gait with Narrow Base of Support severe impairment 0  8 Gait with Eyes Closed moderate impairment 1  9 Ambulating Backwards moderate impairment 1    10 Steps  moderate impairment 1    Total: 17/30   * Score of <=22/30 indicates that patient is at increased risk for falls.   5xSTS: 19.08sec w UE support   GAIT: Distance walked: within clinic Assistive device utilized: None Level of assistance: Complete Independence Comments: mildly widened BOS, reduced truncal rotation and arm swing BIL  TREATMENT DATE:  [facility] Adult PT Treatment:                                                DATE: 05/27/24 Therapeutic Exercise: STS + tandem stance practice reps, HEP education/handout emphasis on safe/appropriate home setup  Self Care: Education/discussion re: exam findings as they relate to safety, fall risk, appropriate compensation strategies, managing environmental factors as able                                                                                                                                PATIENT EDUCATION:  Education details: Pt education on PT impairments, prognosis, and POC. Informed consent. Rationale for interventions, safe/appropriate HEP performance, self care as above Person educated: Patient Education method: Explanation, Demonstration, Tactile cues, Verbal cues Education comprehension: verbalized understanding, returned demonstration, verbal cues required, tactile cues required, and needs further education    HOME EXERCISE PROGRAM: Access Code: [email or web address] URL: [email or web address] Date: 05/27/2024 Prepared by: Alm Jenny  Exercises - Sit to Stand with Armchair  - 2-3 x daily - 1 sets - 5-8 reps - Standing Tandem Balance with Counter Support  - 2-3 x daily - 1 sets - 1-2 reps - 20-230sec hold  ASSESSMENT:  CLINICAL IMPRESSION: Patient is a pleasant 68 y.o. gentleman who was seen today for physical therapy evaluation and treatment for gait instability ongoing over past two years, worsening over past six months. No overt red flags in discussion/screening today. On exam he demonstrates global weakness of BIL  LE L>R. Sensation and coordination appear intact w/ basic neuro screen. 5xSTS is indicative of fall risk and requires UE support. FGA 17/30 which is indicative of fall risk, most difficulty noted with turns, altered BOS, and vision affected portions. He also demonstrates compensations throughout with noted reduction in velocity when postural stability challenged. Tolerates exam/HEP well, education as above on safe HEP performance and introductory fall risk reduction. No adverse events. Recommend trial of skilled PT to address aforementioned deficits with aim of improving functional tolerance and reducing pain with typical activities. Pt departs today's session in no acute distress, all voiced concerns/questions addressed appropriately from PT perspective.      OBJECTIVE IMPAIRMENTS: Abnormal gait, decreased activity tolerance, decreased balance, decreased endurance, decreased mobility, difficulty walking, decreased strength, and improper body mechanics.   ACTIVITY LIMITATIONS: carrying, lifting, bending, stairs, transfers, bed mobility, and locomotion level  PARTICIPATION LIMITATIONS: cleaning, laundry, community activity, and occupation  PERSONAL FACTORS: Age, Time since onset of injury/illness/exacerbation, and 3+ comorbidities: anxiety/depression, basal cell carcinoma, chronic back pain w/ spinal cord stimulator, CAD s/p DES, DM2, fibromyalgia, HTN, hep B, hx SBO, hx lumbar fusion, sleep apnea, hx urinary incontinence are also affecting patient's functional outcome.   REHAB POTENTIAL: Fair given chronicity and comorbidities  CLINICAL DECISION MAKING: Evolving/moderate complexity  EVALUATION COMPLEXITY: Moderate   GOALS:   SHORT TERM GOALS: Target date: 06/24/2024  Pt will demonstrate appropriate understanding and performance of initially prescribed HEP in order to facilitate improved independence with management of symptoms.  Baseline: HEP established  Goal status: INITIAL   2. Pt will  improve to at least 15sec or less on 5xSTS in order to indicate reduced fall risk.  Baseline: 19sec w UE support  Goal status: INITIAL   LONG TERM GOALS: Target date: 07/22/2024   Pt will score at least 70% on ABC Scale in order to demonstrate reduced fear of falling. MCID (7-23%) Baseline: 8.7/16, 54%  Goal status: INITIAL  2.  Pt will demonstrate LE MMT of at least 4+/5 in tested groups in order to facilitate improved functional strength.  Baseline: see MMT chart above  Goal status: INITIAL   3.  Pt will score greater than or equal to 22/30 on Functional Gait assessment in order to indicate reduced fall risk (cutoff score </= 22/30 predictive of falls per Willye et al 2010, MCID 4 pts Beninato et al 2014)  Baseline: 17/30  Goal status: INITIAL   4. Pt will perform 5xSTS in </=12 sec w/ or w/o UE support in order to demonstrate reduced fall risk and improved functional independence. (MCID of 2.3sec)  Baseline: 19sec w/ UE support  Goal status: INITIAL   5. Pt will demonstrate appropriate performance of final prescribed HEP in order to facilitate improved self-management of symptoms post-discharge.   Baseline: initial HEP prescribed  Goal status: INITIAL     PLAN:  PT FREQUENCY: 1-2x/week  PT DURATION: 8 weeks  PLANNED INTERVENTIONS: [id number]- PT Re-evaluation, 97750- Physical Performance Testing, 97110-Therapeutic exercises, 97530- Therapeutic activity, [id number]- Neuromuscular re-education, 97535- Self Care, [id number]- Manual therapy, [phone]- Gait training, Patient/Family education, Balance training, Stair training, Taping, Vestibular training, Cryotherapy, and Moist heat.  PLAN FOR NEXT SESSION: Review/update HEP PRN. Work on balance/strength exercises as appropriate with emphasis on altered BOS (static and dynamic), generalized LE strength, dual tasking. May be helpful to look at righting reactions and reactive postural stability. Mindful of spinal cord stimulator, cardiac hx, implanted  sleep apnea device, fusion   Alm DELENA Jenny PT, DPT 05/27/2024 5:09 PM

## 2024-05-27 ENCOUNTER — Ambulatory Visit: Payer: Medicare (Managed Care) | Admitting: Family Medicine

## 2024-05-27 ENCOUNTER — Encounter: Payer: Self-pay | Admitting: Physical Therapy

## 2024-05-27 ENCOUNTER — Other Ambulatory Visit (HOSPITAL_BASED_OUTPATIENT_CLINIC_OR_DEPARTMENT_OTHER): Payer: Self-pay

## 2024-05-27 ENCOUNTER — Other Ambulatory Visit: Payer: Self-pay

## 2024-05-27 ENCOUNTER — Ambulatory Visit: Payer: Medicare (Managed Care) | Attending: Family Medicine | Admitting: Physical Therapy

## 2024-05-27 DIAGNOSIS — I10 Essential (primary) hypertension: Secondary | ICD-10-CM | POA: Diagnosis not present

## 2024-05-27 DIAGNOSIS — R2681 Unsteadiness on feet: Secondary | ICD-10-CM | POA: Insufficient documentation

## 2024-05-27 DIAGNOSIS — F3341 Major depressive disorder, recurrent, in partial remission: Secondary | ICD-10-CM | POA: Diagnosis not present

## 2024-05-27 DIAGNOSIS — E119 Type 2 diabetes mellitus without complications: Secondary | ICD-10-CM

## 2024-05-27 DIAGNOSIS — Z9641 Presence of insulin pump (external) (internal): Secondary | ICD-10-CM

## 2024-05-27 DIAGNOSIS — Z7984 Long term (current) use of oral hypoglycemic drugs: Secondary | ICD-10-CM

## 2024-05-27 DIAGNOSIS — M6281 Muscle weakness (generalized): Secondary | ICD-10-CM | POA: Diagnosis present

## 2024-05-27 DIAGNOSIS — Z794 Long term (current) use of insulin: Secondary | ICD-10-CM

## 2024-05-27 MED ORDER — INSULIN DEGLUDEC 100 UNIT/ML ~~LOC~~ SOPN
25.0000 [IU] | PEN_INJECTOR | Freq: Every day | SUBCUTANEOUS | 0 refills | Status: DC
  Filled 2024-05-27: qty 9, 36d supply, fill #0

## 2024-05-27 MED ORDER — INSULIN DEGLUDEC 100 UNIT/ML ~~LOC~~ SOPN
25.0000 [IU] | PEN_INJECTOR | Freq: Every day | SUBCUTANEOUS | 1 refills | Status: AC
  Filled 2024-05-27 (×2): qty 15, 60d supply, fill #0
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
## 2024-05-30 ENCOUNTER — Encounter: Payer: Self-pay | Admitting: Family Medicine

## 2024-05-30 NOTE — Assessment & Plan Note (Signed)
 Reports her blood sugars have been a little better recently.  Plan to continue basal insulin  with Tresiba  for now.  He does have an appointment with endocrinology.

## 2024-05-30 NOTE — Assessment & Plan Note (Signed)
 Continues to have some stressors related to financial issues.  He feels the bupropion  is still working well for him.  Overall he is handling this pretty well.  He will let me know if having new or worsening symptoms

## 2024-05-30 NOTE — Assessment & Plan Note (Signed)
 Blood pressure remains elevated.  Will plan to have him stop back in a couple weeks for blood pressure recheck.

## 2024-05-31 ENCOUNTER — Other Ambulatory Visit: Payer: Self-pay

## 2024-05-31 ENCOUNTER — Ambulatory Visit: Payer: Medicare (Managed Care)

## 2024-05-31 DIAGNOSIS — R2681 Unsteadiness on feet: Secondary | ICD-10-CM | POA: Diagnosis not present

## 2024-05-31 DIAGNOSIS — M6281 Muscle weakness (generalized): Secondary | ICD-10-CM

## 2024-05-31 NOTE — Therapy (Signed)
 OUTPATIENT PHYSICAL THERAPY TREATMENT   Patient Name: Brandon [name] MRN: [id number] DOB:1956-02-14, 68 y.o., male Today's Date: 05/31/2024  END OF SESSION:  PT End of Session - 05/31/24 1316     Visit Number 2    Number of Visits 17    Date for Recertification  07/22/24    Authorization Type [employer] medicare    Progress Note Due on Visit 10    PT Start Time 1318    PT Stop Time 1403    PT Time Calculation (min) 45 min    Activity Tolerance Patient tolerated treatment well    Behavior During Therapy WFL for tasks assessed/performed          Past Medical History:  Diagnosis Date   Anxiety    Arthritis    Basal cell carcinoma    Basal cell carcinoma (BCC) in situ of skin    left neck   BCC (basal cell carcinoma of skin) 01/14/2024   On vertex scalp treated with Mohs 12/31/23    Chronic back pain    has spinal cord stimulator and wears buprenorphine  patch   Colon polyps    Coronary artery disease    S/p DES to mRCA in 12/21 // S/p DES to LCx x 2 in 12/2020 // Diff dz in small to mod Dx (not a good target for PCI); OM1 100 CTO; severe dz in small OM2 and PDA >> Med Rx   Depression    Diabetes mellitus without complication (HCC)    type 2   Fibromyalgia    Gallstones    GERD (gastroesophageal reflux disease)    Hepatitis B    HLD (hyperlipidemia)    Hypertension    Osteoarthritis    Pneumonia    PONV (postoperative nausea and vomiting)    SBO (small bowel obstruction) (HCC)    Sleep apnea    does not use CPAP   Past Surgical History:  Procedure Laterality Date   CARDIAC CATHETERIZATION  01/17/2021   CATARACT EXTRACTION W/ INTRAOCULAR LENS  IMPLANT, BILATERAL     CHOLECYSTECTOMY     CHOLECYSTECTOMY, LAPAROSCOPIC     COLONOSCOPY WITH PROPOFOL  N/A 12/19/2022   Procedure: COLONOSCOPY WITH PROPOFOL ;  Surgeon: Stacia Glendia BRAVO, MD;  Location: [facility] ENDOSCOPY;  Service: Gastroenterology;  Laterality: N/A;   CORONARY STENT INTERVENTION N/A 08/15/2020   Procedure:  CORONARY STENT INTERVENTION;  Surgeon: Dann Candyce RAMAN, MD;  Location: [facility] INVASIVE CV LAB;  Service: Cardiovascular;  Laterality: N/A;   CORONARY STENT INTERVENTION N/A 01/17/2021   Procedure: CORONARY STENT INTERVENTION;  Surgeon: Verlin Lonni BIRCH, MD;  Location: [facility] INVASIVE CV LAB;  Service: Cardiovascular;  Laterality: N/A;   CORONARY ULTRASOUND/IVUS N/A 08/15/2020   Procedure: Intravascular Ultrasound/IVUS;  Surgeon: Dann Candyce RAMAN, MD;  Location: [facility] INVASIVE CV LAB;  Service: Cardiovascular;  Laterality: N/A;   DRUG INDUCED ENDOSCOPY N/A 05/23/2022   Procedure: DRUG INDUCED SLEEP ENDOSCOPY;  Surgeon: Mable Lenis, MD;  Location: [facility] OR;  Service: ENT;  Laterality: N/A;   HERNIA REPAIR     IMPLANTATION OF HYPOGLOSSAL NERVE STIMULATOR Right 07/30/2022   Procedure: IMPLANTATION OF HYPOGLOSSAL NERVE STIMULATOR;  Surgeon: Mable Lenis, MD;  Location: [facility];  Service: ENT;  Laterality: Right;   LEFT HEART CATH AND CORONARY ANGIOGRAPHY N/A 08/15/2020   Procedure: LEFT HEART CATH AND CORONARY ANGIOGRAPHY;  Surgeon: Dann Candyce RAMAN, MD;  Location: [facility] INVASIVE CV LAB;  Service: Cardiovascular;  Laterality: N/A;   LEFT HEART CATH AND CORONARY ANGIOGRAPHY N/A  01/17/2021   Procedure: LEFT HEART CATH AND CORONARY ANGIOGRAPHY;  Surgeon: Verlin Lonni BIRCH, MD;  Location: [facility] INVASIVE CV LAB;  Service: Cardiovascular;  Laterality: N/A;   LUMBAR FUSION     L3-5 with rods   MICRODISCECTOMY LUMBAR  2006   POLYPECTOMY  12/19/2022   Procedure: POLYPECTOMY;  Surgeon: Stacia Glendia BRAVO, MD;  Location: [facility] ENDOSCOPY;  Service: Gastroenterology;;   SPINAL CORD STIMULATOR INSERTION     blood clot removed from spinal cord   SPINE SURGERY     blood clot removed from spinal cord   XI ROBOTIC ASSISTED INGUINAL HERNIA REPAIR WITH MESH     x 3 surgeries   Patient Active Problem List   Diagnosis Date Noted   Urinary frequency 05/10/2024   Urinary incontinence  02/29/2024   Encounter for HIV pre-exposure prophylaxis 02/29/2024   BCC (basal cell carcinoma of skin) 01/14/2024   Cellulitis of left lower extremity 12/16/2023   Injury of face 10/30/2023   Fall 09/17/2023   Right wrist pain 09/17/2023   Acute pain of right knee 09/17/2023   Right facial pain 09/17/2023   Laceration of right eyebrow 09/17/2023   Pre-ulcerative corn or callous 07/29/2023   Morbid obesity (HCC) 07/29/2023   Chronic back pain 06/08/2023   BPH (benign prostatic hyperplasia) 06/08/2023   GERD (gastroesophageal reflux disease) 06/08/2023   History of colonic polyps 12/19/2022   Family history of colorectal cancer 12/19/2022   Nocturnal oxygen  desaturation 11/06/2022   Neuroforaminal stenosis of cervical spine 05/02/2022   Well adult exam 03/26/2022   AMS (altered mental status) 11/01/2021   Community acquired pneumonia 10/31/2021   Coughing 10/29/2021   Pain management contract signed 10/11/2021   History of lumbar fusion (L4/5) 10/02/2021   Chronic pain syndrome 06/01/2021   Obstructive sleep apnea 05/28/2021   Metallic taste 05/21/2021   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   Acute pain of left shoulder 04/03/2021   Unstable angina (HCC)    Insomnia 09/03/2020   Status post coronary artery stent placement    Mixed hyperlipidemia    Coronary artery disease    Chest pain 08/11/2020   Lumbar facet arthropathy 06/22/2020   Major depression in partial remission 06/04/2020   Hyperlipidemia associated with type 2 diabetes mellitus (HCC) 04/14/2020   Primary hypertension 04/07/2020   Diabetes mellitus type 2, uncomplicated, on long term insulin  pump (HCC) 04/07/2020   Spinal cord stimulator status 05/19/2017   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   Erectile dysfunction 10/09/2015    PCP: Alvia Bring, DO  REFERRING PROVIDER: Alvia Bring, DO  REFERRING DIAG: R26.81 (ICD-10-CM) - Gait instability  Rationale for Evaluation and Treatment: Rehabilitation  THERAPY  DIAG:  Unsteadiness on feet  Muscle weakness (generalized)  ONSET DATE: last couple years  SUBJECTIVE:  SUBJECTIVE STATEMENT: Patient reports he's noticed in the last few weeks that when turning corners he sometimes loses his balance; states standing up from his couch can sometimes be difficult. Patient reports no falls since last visit. Patient states he will wear 2# ankle weights sometimes when walking around apartment.   EVAL: Pt endorses ongoing issues w/ balance over last couple of years without clear precipitating factor. He endorses worsening over past six months with 1-2 near falls every day, but no recent falls. He states his legs generally feel weak, particularly in the mornings and after prolonged positioning, with tendency to have trouble picking up his feet. He also reports difficulty w/ bed and chair transfers, feels like sometimes he will just keep going and has difficulty righting himself. He endorses difficulty with turns, unlevel ground. Has to be careful when carrying items. Also has a dog and notes that between dog toys and the dog moving around he will sometimes nearly trip. He does report a history of L sided LE nerve issues for which he used to use a cane.  He denies any dizziness, visual changes. He denies LE N/T, although he does note sometimes he will get LUE numbness when he turns his head. He denies bowel/bladder symptoms, fevers/chills, or recent unexplained weight loss. Does report some variable BP (both high and low) but states he is managing this with PCP.    PERTINENT HISTORY:   anxiety/depression, basal cell carcinoma, chronic back pain w/ spinal cord stimulator, CAD s/p DES, DM2, fibromyalgia, HTN, hep B, hx SBO, hx lumbar fusion, sleep apnea, hx urinary incontinence  PAIN:   Denies any significant issues w/ pain  PRECAUTIONS: cardiac hx, spinal fusion and spinal cord stimulator.  implanted sleep apnea device (per pt report)    RED FLAGS: None   WEIGHT BEARING RESTRICTIONS: No  FALLS:  Has patient fallen in last 6 months? No - has had near falls frequently (1-2 a day) recently  LIVING ENVIRONMENT: Apartment, has dog, no STE Grab bar in shower, no other equipment  OCCUPATION: starting a new job at [employer] soon. retired respiratory therapist for pediatric critical care  PLOF: Independent  PATIENT GOALS: better strength in legs, ways to maintain balance   NEXT MD VISIT: 2 weeks   OBJECTIVE:  Note: Objective measures were completed at Evaluation unless otherwise noted.  DIAGNOSTIC FINDINGS:  No recent imaging in chart  PATIENT SURVEYS:  ABC: 8.7/16, 54%   COGNITION: Overall cognitive status: Within functional limits for tasks assessed     SENSATION/NEURO: Light touch intact BIL LE Negative hoffmann and tromner sign BIL No ataxia with gait FMC intact BIL UE Heel<>shin affected by weakness, no ataxia noted, symmetrical BIL   STRENGTH TESTING:  MMT Right eval Left eval  Shoulder flexion    Shoulder abduction    Elbow flexion    Elbow extension    Grip strength (gross)    Hip flexion 4- 4-  Hip abduction (modified sitting) 4+ 4+  Knee flexion 4 4-  Knee extension 4 4-  Ankle dorsiflexion 4 4  Ankle plantarflexion     (Blank rows = not tested) (Key: WFL = within functional limits not formally assessed, * = concordant pain, s = stiffness/stretching sensation, NT = not tested)  Comments:     FUNCTIONAL TESTS:   FUNCTIONAL GAIT ASSESSMENT:  ITEM EVAL  1 Gait Level Surface Normal 3   2 Change in Gait Speed mild impairment 2   3 Gait with Horizontal Head Turns Normal  3   4 Gait with Vertical Head Turns Normal 3   5 Gait with Pivot Turn mild impairment 2  6 Step Over Obstacle moderate impairment 1    7 Gait with Narrow  Base of Support severe impairment 0  8 Gait with Eyes Closed moderate impairment 1    9 Ambulating Backwards moderate impairment 1    10 Steps moderate impairment 1    Total: 17/30   * Score of <=22/30 indicates that patient is at increased risk for falls.   5xSTS: 19.08sec w UE support   GAIT: Distance walked: within clinic Assistive device utilized: None Level of assistance: Complete Independence Comments: mildly widened BOS, reduced truncal rotation and arm swing BIL   [facility] Adult PT Treatment:                                                DATE: 05/31/2024 Neuromuscular re-ed: Seated: Hip add ball squeezes 10x5 Hip abd with isometric hold + green TB 10x5 Marching + green TB 2x20 Modified tandem balance Therapeutic Activity: Sit to stand x10 + UE assist Counter: Heel raises x20 Toe raises 10x3 Mini squats + hip hinge mechanics x12 Walking + head turns Cone weaving  Lateral weaving through cones Railing: Standing HS stretch at 12 step Hip abduction x10 (bil) Knee flexion x10 (bil) Standing --> seated straight leg raise review    TREATMENT DATE:  [facility] Adult PT Treatment:                                                DATE: 05/27/24 Therapeutic Exercise: STS + tandem stance practice reps, HEP education/handout emphasis on safe/appropriate home setup  Self Care: Education/discussion re: exam findings as they relate to safety, fall risk, appropriate compensation strategies, managing environmental factors as able                                                                                                                                PATIENT EDUCATION:  Education details: Pt education on PT impairments, prognosis, and POC. Informed consent. Rationale for interventions, safe/appropriate HEP performance, self care as above Person educated: Patient Education method: Explanation, Demonstration, Tactile cues, Verbal cues Education comprehension: verbalized  understanding, returned demonstration, verbal cues required, tactile cues required, and needs further education    HOME EXERCISE PROGRAM: Access Code: [email or web address] URL: [email or web address] Date: 05/31/2024 Prepared by: Lamarr Price  Exercises - Sit to Stand with Armchair  - 2-3 x daily - 1 sets - 5-8 reps - Standing Tandem Balance with Counter Support  - 2-3 x daily - 1 sets - 1-2 reps - 20-230sec hold - Seated Hip Adduction Isometrics with Mercer  -  1 x daily - 7 x weekly - 1-3 sets - 10 reps - Seated Hip Abduction with Resistance  - 1 x daily - 7 x weekly - 1-3 sets - 10 reps - 5 sec hold - Standing Hip Abduction with Counter Support  - 1 x daily - 7 x weekly - 1-3 sets - 10 reps - Standing Knee Flexion with Counter Support  - 1 x daily - 7 x weekly - 1-3 sets - 10 reps - Seated Straight Leg Raise   - 1 x daily - 7 x weekly - 1-3 sets - 10 reps - 3-5 sec hold  ASSESSMENT:  CLINICAL IMPRESSION: Hip weakness noted during hip abd/add strengthening exercises in sitting. Tandem stance modified for wider base of support and improved balance; recommended patient make adjustment with HEP and gradually work back to tandem. Quad activation improved over time with standing straight leg raises; added seated version to HEP. Cueing improved hip hinge mechanics and postural awareness during standing squats. Patient demonstrated increased caution and slow pace during cone weaving activity due to fear of falling and unsteadiness. Patient will continue to benefit from skilled therapy to address strength and balance deficits.   EVAL: Patient is a pleasant 68 y.o. gentleman who was seen today for physical therapy evaluation and treatment for gait instability ongoing over past two years, worsening over past six months. No overt red flags in discussion/screening today. On exam he demonstrates global weakness of BIL LE L>R. Sensation and coordination appear intact w/ basic neuro screen. 5xSTS is  indicative of fall risk and requires UE support. FGA 17/30 which is indicative of fall risk, most difficulty noted with turns, altered BOS, and vision affected portions. He also demonstrates compensations throughout with noted reduction in velocity when postural stability challenged. Tolerates exam/HEP well, education as above on safe HEP performance and introductory fall risk reduction. No adverse events. Recommend trial of skilled PT to address aforementioned deficits with aim of improving functional tolerance and reducing pain with typical activities. Pt departs today's session in no acute distress, all voiced concerns/questions addressed appropriately from PT perspective.      OBJECTIVE IMPAIRMENTS: Abnormal gait, decreased activity tolerance, decreased balance, decreased endurance, decreased mobility, difficulty walking, decreased strength, and improper body mechanics.   ACTIVITY LIMITATIONS: carrying, lifting, bending, stairs, transfers, bed mobility, and locomotion level  PARTICIPATION LIMITATIONS: cleaning, laundry, community activity, and occupation  PERSONAL FACTORS: Age, Time since onset of injury/illness/exacerbation, and 3+ comorbidities: anxiety/depression, basal cell carcinoma, chronic back pain w/ spinal cord stimulator, CAD s/p DES, DM2, fibromyalgia, HTN, hep B, hx SBO, hx lumbar fusion, sleep apnea, hx urinary incontinence are also affecting patient's functional outcome.   REHAB POTENTIAL: Fair given chronicity and comorbidities  CLINICAL DECISION MAKING: Evolving/moderate complexity  EVALUATION COMPLEXITY: Moderate   GOALS:   SHORT TERM GOALS: Target date: 06/24/2024  Pt will demonstrate appropriate understanding and performance of initially prescribed HEP in order to facilitate improved independence with management of symptoms.  Baseline: HEP established  Goal status: INITIAL   2. Pt will improve to at least 15sec or less on 5xSTS in order to indicate reduced fall  risk.  Baseline: 19sec w UE support  Goal status: INITIAL   LONG TERM GOALS: Target date: 07/22/2024   Pt will score at least 70% on ABC Scale in order to demonstrate reduced fear of falling. MCID (7-23%) Baseline: 8.7/16, 54%  Goal status: INITIAL  2.  Pt will demonstrate LE MMT of at least 4+/5 in tested groups  in order to facilitate improved functional strength.  Baseline: see MMT chart above  Goal status: INITIAL   3.  Pt will score greater than or equal to 22/30 on Functional Gait assessment in order to indicate reduced fall risk (cutoff score </= 22/30 predictive of falls per Willye et al 2010, MCID 4 pts Beninato et al 2014)  Baseline: 17/30  Goal status: INITIAL   4. Pt will perform 5xSTS in </=12 sec w/ or w/o UE support in order to demonstrate reduced fall risk and improved functional independence. (MCID of 2.3sec)  Baseline: 19sec w/ UE support  Goal status: INITIAL   5. Pt will demonstrate appropriate performance of final prescribed HEP in order to facilitate improved self-management of symptoms post-discharge.   Baseline: initial HEP prescribed  Goal status: INITIAL     PLAN:  PT FREQUENCY: 1-2x/week  PT DURATION: 8 weeks  PLANNED INTERVENTIONS: [id number]- PT Re-evaluation, 97750- Physical Performance Testing, 97110-Therapeutic exercises, 97530- Therapeutic activity, [id number]- Neuromuscular re-education, 97535- Self Care, [id number]- Manual therapy, [phone]- Gait training, Patient/Family education, Balance training, Stair training, Taping, Vestibular training, Cryotherapy, and Moist heat.  PLAN FOR NEXT SESSION: Review/update HEP PRN. Work on balance/strength exercises as appropriate with emphasis on altered BOS (static and dynamic), generalized LE strength, dual tasking. May be helpful to look at righting reactions and reactive postural stability. Mindful of spinal cord stimulator, cardiac hx, implanted sleep apnea device, fusion   Lamarr Price, PTA 05/31/2024 2:04 PM

## 2024-06-01 ENCOUNTER — Other Ambulatory Visit: Payer: Self-pay

## 2024-06-01 ENCOUNTER — Other Ambulatory Visit (HOSPITAL_BASED_OUTPATIENT_CLINIC_OR_DEPARTMENT_OTHER): Payer: Self-pay

## 2024-06-01 ENCOUNTER — Encounter: Payer: Self-pay | Admitting: Internal Medicine

## 2024-06-01 DIAGNOSIS — E1169 Type 2 diabetes mellitus with other specified complication: Secondary | ICD-10-CM

## 2024-06-01 DIAGNOSIS — E782 Mixed hyperlipidemia: Secondary | ICD-10-CM

## 2024-06-01 MED ORDER — ROSUVASTATIN CALCIUM 20 MG PO TABS
20.0000 mg | ORAL_TABLET | Freq: Every day | ORAL | 3 refills | Status: AC
  Filled 2024-06-01: qty 30, 30d supply, fill #0
  Filled 2024-07-15: qty 30, 30d supply, fill #1

## 2024-06-02 ENCOUNTER — Encounter: Payer: Self-pay | Admitting: Physical Therapy

## 2024-06-02 ENCOUNTER — Ambulatory Visit: Payer: Medicare (Managed Care) | Admitting: Physical Therapy

## 2024-06-02 DIAGNOSIS — R2681 Unsteadiness on feet: Secondary | ICD-10-CM | POA: Diagnosis not present

## 2024-06-02 DIAGNOSIS — M6281 Muscle weakness (generalized): Secondary | ICD-10-CM

## 2024-06-02 NOTE — Therapy (Signed)
 OUTPATIENT PHYSICAL THERAPY TREATMENT   Patient Name: ARLIS YALE MRN: [id number] DOB:03-Nov-1955, 68 y.o., male Today's Date: 06/02/2024  END OF SESSION:  PT End of Session - 06/02/24 0931     Visit Number 3    Number of Visits 17    Date for Recertification  07/22/24    Authorization Type cign[employer] medicare    Progress Note Due on Visit 10    PT Start Time [phone]    PT Stop Time 1015    PT Time Calculation (min) 44 min           Past Medical History:  Diagnosis Date   Anxiety    Arthritis    Basal cell carcinoma    Basal cell carcinoma (BCC) in situ of skin    left neck   BCC (basal cell carcinoma of skin) 01/14/2024   On vertex scalp treated with Mohs 12/31/23    Chronic back pain    has spinal cord stimulator and wears buprenorphine  patch   Colon polyps    Coronary artery disease    S/p DES to mRCA in 12/21 // S/p DES to LCx x 2 in 12/2020 // Diff dz in small to mod Dx (not a good target for PCI); OM1 100 CTO; severe dz in small OM2 and PDA >> Med Rx   Depression    Diabetes mellitus without complication (HCC)    type 2   Fibromyalgia    Gallstones    GERD (gastroesophageal reflux disease)    Hepatitis B    HLD (hyperlipidemia)    Hypertension    Osteoarthritis    Pneumonia    PONV (postoperative nausea and vomiting)    SBO (small bowel obstruction) (HCC)    Sleep apnea    does not use CPAP   Past Surgical History:  Procedure Laterality Date   CARDIAC CATHETERIZATION  01/17/2021   CATARACT EXTRACTION W/ INTRAOCULAR LENS  IMPLANT, BILATERAL     CHOLECYSTECTOMY     CHOLECYSTECTOMY, LAPAROSCOPIC     COLONOSCOPY WITH PROPOFOL  N/A 12/19/2022   Procedure: COLONOSCOPY WITH PROPOFOL ;  Surgeon: Stacia Glendia BRAVO, MD;  Location: [facility] ENDOSCOPY;  Service: Gastroenterology;  Laterality: N/A;   CORONARY STENT INTERVENTION N/A 08/15/2020   Procedure: CORONARY STENT INTERVENTION;  Surgeon: Dann Candyce RAMAN, MD;  Location: [facility] INVASIVE CV LAB;  Service: Cardiovascular;   Laterality: N/A;   CORONARY STENT INTERVENTION N/A 01/17/2021   Procedure: CORONARY STENT INTERVENTION;  Surgeon: Verlin Lonni BIRCH, MD;  Location: [facility] INVASIVE CV LAB;  Service: Cardiovascular;  Laterality: N/A;   CORONARY ULTRASOUND/IVUS N/A 08/15/2020   Procedure: Intravascular Ultrasound/IVUS;  Surgeon: Dann Candyce RAMAN, MD;  Location: [facility] INVASIVE CV LAB;  Service: Cardiovascular;  Laterality: N/A;   DRUG INDUCED ENDOSCOPY N/A 05/23/2022   Procedure: DRUG INDUCED SLEEP ENDOSCOPY;  Surgeon: Mable Lenis, MD;  Location: [facility] OR;  Service: ENT;  Laterality: N/A;   HERNIA REPAIR     IMPLANTATION OF HYPOGLOSSAL NERVE STIMULATOR Right 07/30/2022   Procedure: IMPLANTATION OF HYPOGLOSSAL NERVE STIMULATOR;  Surgeon: Mable Lenis, MD;  Location: [facility];  Service: ENT;  Laterality: Right;   LEFT HEART CATH AND CORONARY ANGIOGRAPHY N/A 08/15/2020   Procedure: LEFT HEART CATH AND CORONARY ANGIOGRAPHY;  Surgeon: Dann Candyce RAMAN, MD;  Location: [facility] INVASIVE CV LAB;  Service: Cardiovascular;  Laterality: N/A;   LEFT HEART CATH AND CORONARY ANGIOGRAPHY N/A 01/17/2021   Procedure: LEFT HEART CATH AND CORONARY ANGIOGRAPHY;  Surgeon: Verlin Lonni BIRCH, MD;  Location:  [facility] INVASIVE CV LAB;  Service: Cardiovascular;  Laterality: N/A;   LUMBAR FUSION     L3-5 with rods   MICRODISCECTOMY LUMBAR  2006   POLYPECTOMY  12/19/2022   Procedure: POLYPECTOMY;  Surgeon: Stacia Glendia BRAVO, MD;  Location: [facility] ENDOSCOPY;  Service: Gastroenterology;;   SPINAL CORD STIMULATOR INSERTION     blood clot removed from spinal cord   SPINE SURGERY     blood clot removed from spinal cord   XI ROBOTIC ASSISTED INGUINAL HERNIA REPAIR WITH MESH     x 3 surgeries   Patient Active Problem List   Diagnosis Date Noted   Urinary frequency 05/10/2024   Urinary incontinence 02/29/2024   Encounter for HIV pre-exposure prophylaxis 02/29/2024   BCC (basal cell carcinoma of skin) 01/14/2024    Cellulitis of left lower extremity 12/16/2023   Injury of face 10/30/2023   Fall 09/17/2023   Right wrist pain 09/17/2023   Acute pain of right knee 09/17/2023   Right facial pain 09/17/2023   Laceration of right eyebrow 09/17/2023   Pre-ulcerative corn or callous 07/29/2023   Morbid obesity (HCC) 07/29/2023   Chronic back pain 06/08/2023   BPH (benign prostatic hyperplasia) 06/08/2023   GERD (gastroesophageal reflux disease) 06/08/2023   History of colonic polyps 12/19/2022   Family history of colorectal cancer 12/19/2022   Nocturnal oxygen  desaturation 11/06/2022   Neuroforaminal stenosis of cervical spine 05/02/2022   Well adult exam 03/26/2022   AMS (altered mental status) 11/01/2021   Community acquired pneumonia 10/31/2021   Coughing 10/29/2021   Pain management contract signed 10/11/2021   History of lumbar fusion (L4/5) 10/02/2021   Chronic pain syndrome 06/01/2021   Obstructive sleep apnea 05/28/2021   Metallic taste 05/21/2021   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   Acute pain of left shoulder 04/03/2021   Unstable angina (HCC)    Insomnia 09/03/2020   Status post coronary artery stent placement    Mixed hyperlipidemia    Coronary artery disease    Chest pain 08/11/2020   Lumbar facet arthropathy 06/22/2020   Major depression in partial remission 06/04/2020   Hyperlipidemia associated with type 2 diabetes mellitus (HCC) 04/14/2020   Primary hypertension 04/07/2020   Diabetes mellitus type 2, uncomplicated, on long term insulin  pump (HCC) 04/07/2020   Spinal cord stimulator status 05/19/2017   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   Erectile dysfunction 10/09/2015    PCP: Alvia Bring, DO  REFERRING PROVIDER: Alvia Bring, DO  REFERRING DIAG: R26.81 (ICD-10-CM) - Gait instability  Rationale for Evaluation and Treatment: Rehabilitation  THERAPY DIAG:  Unsteadiness on feet  Muscle weakness (generalized)  ONSET DATE: last couple years  SUBJECTIVE:  SUBJECTIVE STATEMENT: 06/02/2024: had a bit of fatigue/soreness after last session. Cleared up within an hour or two. Also had a bit of aching in low back for about an hour. Otherwise no new updates.   EVAL: Pt endorses ongoing issues w/ balance over last couple of years without clear precipitating factor. He endorses worsening over past six months with 1-2 near falls every day, but no recent falls. He states his legs generally feel weak, particularly in the mornings and after prolonged positioning, with tendency to have trouble picking up his feet. He also reports difficulty w/ bed and chair transfers, feels like sometimes he will just keep going and has difficulty righting himself. He endorses difficulty with turns, unlevel ground. Has to be careful when carrying items. Also has a dog and notes that between dog toys and the dog moving around he will sometimes nearly trip. He does report a history of L sided LE nerve issues for which he used to use a cane.  He denies any dizziness, visual changes. He denies LE N/T, although he does note sometimes he will get LUE numbness when he turns his head. He denies bowel/bladder symptoms, fevers/chills, or recent unexplained weight loss. Does report some variable BP (both high and low) but states he is managing this with PCP.    PERTINENT HISTORY:   anxiety/depression, basal cell carcinoma, chronic back pain w/ spinal cord stimulator, CAD s/p DES, DM2, fibromyalgia, HTN, hep B, hx SBO, hx lumbar fusion, sleep apnea, hx urinary incontinence  PAIN:  Denies any significant issues w/ pain  PRECAUTIONS: cardiac hx, spinal fusion and spinal cord stimulator.  implanted sleep apnea device (per pt report)    RED FLAGS: None   WEIGHT BEARING RESTRICTIONS: No  FALLS:  Has patient fallen in  last 6 months? No - has had near falls frequently (1-2 a day) recently  LIVING ENVIRONMENT: Apartment, has dog, no STE Grab bar in shower, no other equipment  OCCUPATION: starting a new job at [employer] soon. retired respiratory therapist for pediatric critical care  PLOF: Independent  PATIENT GOALS: better strength in legs, ways to maintain balance   NEXT MD VISIT: 2 weeks   OBJECTIVE:  Note: Objective measures were completed at Evaluation unless otherwise noted.  DIAGNOSTIC FINDINGS:  No recent imaging in chart  PATIENT SURVEYS:  ABC: 8.7/16, 54%   COGNITION: Overall cognitive status: Within functional limits for tasks assessed     SENSATION/NEURO: Light touch intact BIL LE Negative hoffmann and tromner sign BIL No ataxia with gait FMC intact BIL UE Heel<>shin affected by weakness, no ataxia noted, symmetrical BIL   STRENGTH TESTING:  MMT Right eval Left eval  Shoulder flexion    Shoulder abduction    Elbow flexion    Elbow extension    Grip strength (gross)    Hip flexion 4- 4-  Hip abduction (modified sitting) 4+ 4+  Knee flexion 4 4-  Knee extension 4 4-  Ankle dorsiflexion 4 4  Ankle plantarflexion     (Blank rows = not tested) (Key: WFL = within functional limits not formally assessed, * = concordant pain, s = stiffness/stretching sensation, NT = not tested)  Comments:     FUNCTIONAL TESTS:   FUNCTIONAL GAIT ASSESSMENT:  ITEM EVAL  1 Gait Level Surface Normal 3   2 Change in Gait Speed mild impairment 2   3 Gait with Horizontal Head Turns Normal 3   4 Gait with Vertical Head Turns Normal 3   5 Gait  with Pivot Turn mild impairment 2  6 Step Over Obstacle moderate impairment 1    7 Gait with Narrow Base of Support severe impairment 0  8 Gait with Eyes Closed moderate impairment 1    9 Ambulating Backwards moderate impairment 1    10 Steps moderate impairment 1    Total: 17/30   * Score of <=22/30 indicates that patient is at increased risk  for falls.   5xSTS: 19.08sec w UE support   GAIT: Distance walked: within clinic Assistive device utilized: None Level of assistance: Complete Independence Comments: mildly widened BOS, reduced truncal rotation and arm swing BIL    [facility] Adult PT Treatment:                                                DATE: 06/02/24 Therapeutic Exercise: Seated GB hip abd 2x12 Seated hip add iso 2x12 Seated TKE push into bosu 2x8 BIL cues for breath control  HEP discussion/education  Neuromuscular re-ed: Slow march weaning UE support (unilat) 2x8 BIL CGA as support weans, cues for posture and slow velocity (5)cone weaving 4 laps cues for >25%  (5) cone weaving stop go reactive balance 3 laps 2 laps gait + vertical ball toss (dual tasking) CGA    [facility] Adult PT Treatment:                                                DATE: 05/31/2024 Neuromuscular re-ed: Seated: Hip add ball squeezes 10x5 Hip abd with isometric hold + green TB 10x5 Marching + green TB 2x20 Modified tandem balance Therapeutic Activity: Sit to stand x10 + UE assist Counter: Heel raises x20 Toe raises 10x3 Mini squats + hip hinge mechanics x12 Walking + head turns Cone weaving  Lateral weaving through cones Railing: Standing HS stretch at 12 step Hip abduction x10 (bil) Knee flexion x10 (bil) Standing --> seated straight leg raise review   [facility] Adult PT Treatment:                                                DATE: 05/27/24 Therapeutic Exercise: STS + tandem stance practice reps, HEP education/handout emphasis on safe/appropriate home setup  Self Care: Education/discussion re: exam findings as they relate to safety, fall risk, appropriate compensation strategies, managing environmental factors as able                                                                                                                                PATIENT EDUCATION:  Education details: rationale  for interventions, HEP  Person  educated: Patient Education method: Explanation, Demonstration, Tactile cues, Verbal cues Education comprehension: verbalized understanding, returned demonstration, verbal cues required, tactile cues required, and needs further education     HOME EXERCISE PROGRAM: Access Code: [email or web address] URL: [email or web address] Date: 05/31/2024 Prepared by: Lamarr Price  Exercises - Sit to Stand with Armchair  - 2-3 x daily - 1 sets - 5-8 reps - Standing Tandem Balance with Counter Support  - 2-3 x daily - 1 sets - 1-2 reps - 20-230sec hold - Seated Hip Adduction Isometrics with Ball  - 1 x daily - 7 x weekly - 1-3 sets - 10 reps - Seated Hip Abduction with Resistance  - 1 x daily - 7 x weekly - 1-3 sets - 10 reps - 5 sec hold - Standing Hip Abduction with Counter Support  - 1 x daily - 7 x weekly - 1-3 sets - 10 reps - Standing Knee Flexion with Counter Support  - 1 x daily - 7 x weekly - 1-3 sets - 10 reps - Seated Straight Leg Raise   - 1 x daily - 7 x weekly - 1-3 sets - 10 reps - 3-5 sec hold  ASSESSMENT:  CLINICAL IMPRESSION: 06/02/2024: Pt arrives w/ report of fatigue and low back irritation for a couple hours after last session but resolved quickly, no other new updates. Today continuing to work on focal strengthening as above which he tolerates well with gentle progression for volume. Also working on postural stability, emphasis on reactive balance and dual tasking. No adverse events, tolerates well without pain. Some muscular fatigue as expected but pt reports improved subjective tolerance compared to past session. Recommend continuing along current POC in order to address relevant deficits and improve functional tolerance. Pt departs today's session in no acute distress, all voiced questions/concerns addressed appropriately from PT perspective.     EVAL: Patient is a pleasant 68 y.o. gentleman who was seen today for physical therapy evaluation and treatment for gait instability  ongoing over past two years, worsening over past six months. No overt red flags in discussion/screening today. On exam he demonstrates global weakness of BIL LE L>R. Sensation and coordination appear intact w/ basic neuro screen. 5xSTS is indicative of fall risk and requires UE support. FGA 17/30 which is indicative of fall risk, most difficulty noted with turns, altered BOS, and vision affected portions. He also demonstrates compensations throughout with noted reduction in velocity when postural stability challenged. Tolerates exam/HEP well, education as above on safe HEP performance and introductory fall risk reduction. No adverse events. Recommend trial of skilled PT to address aforementioned deficits with aim of improving functional tolerance and reducing pain with typical activities. Pt departs today's session in no acute distress, all voiced concerns/questions addressed appropriately from PT perspective.      OBJECTIVE IMPAIRMENTS: Abnormal gait, decreased activity tolerance, decreased balance, decreased endurance, decreased mobility, difficulty walking, decreased strength, and improper body mechanics.   ACTIVITY LIMITATIONS: carrying, lifting, bending, stairs, transfers, bed mobility, and locomotion level  PARTICIPATION LIMITATIONS: cleaning, laundry, community activity, and occupation  PERSONAL FACTORS: Age, Time since onset of injury/illness/exacerbation, and 3+ comorbidities: anxiety/depression, basal cell carcinoma, chronic back pain w/ spinal cord stimulator, CAD s/p DES, DM2, fibromyalgia, HTN, hep B, hx SBO, hx lumbar fusion, sleep apnea, hx urinary incontinence are also affecting patient's functional outcome.   REHAB POTENTIAL: Fair given chronicity and comorbidities  CLINICAL DECISION MAKING: Evolving/moderate complexity  EVALUATION COMPLEXITY: Moderate   GOALS:   SHORT  TERM GOALS: Target date: 06/24/2024  Pt will demonstrate appropriate understanding and performance of  initially prescribed HEP in order to facilitate improved independence with management of symptoms.  Baseline: HEP established  Goal status: INITIAL   2. Pt will improve to at least 15sec or less on 5xSTS in order to indicate reduced fall risk.  Baseline: 19sec w UE support  Goal status: INITIAL   LONG TERM GOALS: Target date: 07/22/2024   Pt will score at least 70% on ABC Scale in order to demonstrate reduced fear of falling. MCID (7-23%) Baseline: 8.7/16, 54%  Goal status: INITIAL  2.  Pt will demonstrate LE MMT of at least 4+/5 in tested groups in order to facilitate improved functional strength.  Baseline: see MMT chart above  Goal status: INITIAL   3.  Pt will score greater than or equal to 22/30 on Functional Gait assessment in order to indicate reduced fall risk (cutoff score </= 22/30 predictive of falls per Willye et al 2010, MCID 4 pts Beninato et al 2014)  Baseline: 17/30  Goal status: INITIAL   4. Pt will perform 5xSTS in </=12 sec w/ or w/o UE support in order to demonstrate reduced fall risk and improved functional independence. (MCID of 2.3sec)  Baseline: 19sec w/ UE support  Goal status: INITIAL   5. Pt will demonstrate appropriate performance of final prescribed HEP in order to facilitate improved self-management of symptoms post-discharge.   Baseline: initial HEP prescribed  Goal status: INITIAL     PLAN:  PT FREQUENCY: 1-2x/week  PT DURATION: 8 weeks  PLANNED INTERVENTIONS: [id number]- PT Re-evaluation, 97750- Physical Performance Testing, 97110-Therapeutic exercises, 97530- Therapeutic activity, [id number]- Neuromuscular re-education, 97535- Self Care, [id number]- Manual therapy, [phone]- Gait training, Patient/Family education, Balance training, Stair training, Taping, Vestibular training, Cryotherapy, and Moist heat.  PLAN FOR NEXT SESSION: Review/update HEP PRN. Work on balance/strength exercises as appropriate with emphasis on altered BOS (static and dynamic), generalized  LE strength, dual tasking. May be helpful to look at righting reactions and reactive postural stability. Mindful of spinal cord stimulator, cardiac hx, implanted sleep apnea device, fusion   Alm DELENA Jenny PT, DPT 06/02/2024 10:21 AM

## 2024-06-03 ENCOUNTER — Other Ambulatory Visit (HOSPITAL_BASED_OUTPATIENT_CLINIC_OR_DEPARTMENT_OTHER): Payer: Self-pay

## 2024-06-03 ENCOUNTER — Other Ambulatory Visit: Payer: Self-pay

## 2024-06-03 ENCOUNTER — Other Ambulatory Visit: Payer: Self-pay | Admitting: Family Medicine

## 2024-06-03 MED ORDER — MULTI-VITAMIN PO TABS
1.0000 | ORAL_TABLET | Freq: Every day | ORAL | 1 refills | Status: AC
  Filled 2024-06-03: qty 130, 130d supply, fill #0

## 2024-06-04 ENCOUNTER — Other Ambulatory Visit (HOSPITAL_BASED_OUTPATIENT_CLINIC_OR_DEPARTMENT_OTHER): Payer: Self-pay

## 2024-06-07 ENCOUNTER — Encounter: Payer: Medicare (Managed Care) | Admitting: Physical Therapy

## 2024-06-08 ENCOUNTER — Ambulatory Visit: Payer: Medicare (Managed Care) | Admitting: Physical Therapy

## 2024-06-08 DIAGNOSIS — R2681 Unsteadiness on feet: Secondary | ICD-10-CM

## 2024-06-08 DIAGNOSIS — M6281 Muscle weakness (generalized): Secondary | ICD-10-CM

## 2024-06-08 NOTE — Therapy (Signed)
 OUTPATIENT PHYSICAL THERAPY TREATMENT   Patient Name: Brandon [name] MRN: [id number] DOB:1956-08-17, 68 y.o., male Today's Date: 06/08/2024  END OF SESSION:  PT End of Session - 06/08/24 0758     Visit Number 4    Number of Visits 17    Date for Recertification  07/22/24    Authorization Type [employer] medicare    Progress Note Due on Visit 10    PT Start Time 0801    PT Stop Time 0845    PT Time Calculation (min) 44 min            Past Medical History:  Diagnosis Date   Anxiety    Arthritis    Basal cell carcinoma    Basal cell carcinoma (BCC) in situ of skin    left neck   BCC (basal cell carcinoma of skin) 01/14/2024   On vertex scalp treated with Mohs 12/31/23    Chronic back pain    has spinal cord stimulator and wears buprenorphine  patch   Colon polyps    Coronary artery disease    S/p DES to mRCA in 12/21 // S/p DES to LCx x 2 in 12/2020 // Diff dz in small to mod Dx (not a good target for PCI); OM1 100 CTO; severe dz in small OM2 and PDA >> Med Rx   Depression    Diabetes mellitus without complication (HCC)    type 2   Fibromyalgia    Gallstones    GERD (gastroesophageal reflux disease)    Hepatitis B    HLD (hyperlipidemia)    Hypertension    Osteoarthritis    Pneumonia    PONV (postoperative nausea and vomiting)    SBO (small bowel obstruction) (HCC)    Sleep apnea    does not use CPAP   Past Surgical History:  Procedure Laterality Date   CARDIAC CATHETERIZATION  01/17/2021   CATARACT EXTRACTION W/ INTRAOCULAR LENS  IMPLANT, BILATERAL     CHOLECYSTECTOMY     CHOLECYSTECTOMY, LAPAROSCOPIC     COLONOSCOPY WITH PROPOFOL  N/A 12/19/2022   Procedure: COLONOSCOPY WITH PROPOFOL ;  Surgeon: Stacia Glendia BRAVO, MD;  Location: [facility] ENDOSCOPY;  Service: Gastroenterology;  Laterality: N/A;   CORONARY STENT INTERVENTION N/A 08/15/2020   Procedure: CORONARY STENT INTERVENTION;  Surgeon: Dann Candyce RAMAN, MD;  Location: [facility] INVASIVE CV LAB;  Service:  Cardiovascular;  Laterality: N/A;   CORONARY STENT INTERVENTION N/A 01/17/2021   Procedure: CORONARY STENT INTERVENTION;  Surgeon: Verlin Lonni BIRCH, MD;  Location: [facility] INVASIVE CV LAB;  Service: Cardiovascular;  Laterality: N/A;   CORONARY ULTRASOUND/IVUS N/A 08/15/2020   Procedure: Intravascular Ultrasound/IVUS;  Surgeon: Dann Candyce RAMAN, MD;  Location: [facility] INVASIVE CV LAB;  Service: Cardiovascular;  Laterality: N/A;   DRUG INDUCED ENDOSCOPY N/A 05/23/2022   Procedure: DRUG INDUCED SLEEP ENDOSCOPY;  Surgeon: Mable Lenis, MD;  Location: [facility] OR;  Service: ENT;  Laterality: N/A;   HERNIA REPAIR     IMPLANTATION OF HYPOGLOSSAL NERVE STIMULATOR Right 07/30/2022   Procedure: IMPLANTATION OF HYPOGLOSSAL NERVE STIMULATOR;  Surgeon: Mable Lenis, MD;  Location: [facility];  Service: ENT;  Laterality: Right;   LEFT HEART CATH AND CORONARY ANGIOGRAPHY N/A 08/15/2020   Procedure: LEFT HEART CATH AND CORONARY ANGIOGRAPHY;  Surgeon: Dann Candyce RAMAN, MD;  Location: [facility] INVASIVE CV LAB;  Service: Cardiovascular;  Laterality: N/A;   LEFT HEART CATH AND CORONARY ANGIOGRAPHY N/A 01/17/2021   Procedure: LEFT HEART CATH AND CORONARY ANGIOGRAPHY;  Surgeon: Verlin Lonni BIRCH, MD;  Location: [facility] INVASIVE CV LAB;  Service: Cardiovascular;  Laterality: N/A;   LUMBAR FUSION     L3-5 with rods   MICRODISCECTOMY LUMBAR  2006   POLYPECTOMY  12/19/2022   Procedure: POLYPECTOMY;  Surgeon: Stacia Glendia BRAVO, MD;  Location: [facility] ENDOSCOPY;  Service: Gastroenterology;;   SPINAL CORD STIMULATOR INSERTION     blood clot removed from spinal cord   SPINE SURGERY     blood clot removed from spinal cord   XI ROBOTIC ASSISTED INGUINAL HERNIA REPAIR WITH MESH     x 3 surgeries   Patient Active Problem List   Diagnosis Date Noted   Urinary frequency 05/10/2024   Urinary incontinence 02/29/2024   Encounter for HIV pre-exposure prophylaxis 02/29/2024   BCC (basal cell carcinoma of skin)  01/14/2024   Cellulitis of left lower extremity 12/16/2023   Injury of face 10/30/2023   Fall 09/17/2023   Right wrist pain 09/17/2023   Acute pain of right knee 09/17/2023   Right facial pain 09/17/2023   Laceration of right eyebrow 09/17/2023   Pre-ulcerative corn or callous 07/29/2023   Morbid obesity (HCC) 07/29/2023   Chronic back pain 06/08/2023   BPH (benign prostatic hyperplasia) 06/08/2023   GERD (gastroesophageal reflux disease) 06/08/2023   History of colonic polyps 12/19/2022   Family history of colorectal cancer 12/19/2022   Nocturnal oxygen  desaturation 11/06/2022   Neuroforaminal stenosis of cervical spine 05/02/2022   Well adult exam 03/26/2022   AMS (altered mental status) 11/01/2021   Community acquired pneumonia 10/31/2021   Coughing 10/29/2021   Pain management contract signed 10/11/2021   History of lumbar fusion (L4/5) 10/02/2021   Chronic pain syndrome 06/01/2021   Obstructive sleep apnea 05/28/2021   Metallic taste 05/21/2021   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   Acute pain of left shoulder 04/03/2021   Unstable angina (HCC)    Insomnia 09/03/2020   Status post coronary artery stent placement    Mixed hyperlipidemia    Coronary artery disease    Chest pain 08/11/2020   Lumbar facet arthropathy 06/22/2020   Major depression in partial remission 06/04/2020   Hyperlipidemia associated with type 2 diabetes mellitus (HCC) 04/14/2020   Primary hypertension 04/07/2020   Diabetes mellitus type 2, uncomplicated, on long term insulin  pump (HCC) 04/07/2020   Spinal cord stimulator status 05/19/2017   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   Erectile dysfunction 10/09/2015    PCP: Alvia Bring, DO  REFERRING PROVIDER: Alvia Bring, DO  REFERRING DIAG: R26.81 (ICD-10-CM) - Gait instability  Rationale for Evaluation and Treatment: Rehabilitation  THERAPY DIAG:  Unsteadiness on feet  Muscle weakness (generalized)  ONSET DATE: last couple  years  SUBJECTIVE:  SUBJECTIVE STATEMENT: 06/08/2024: felt a bit better after last session. Feels like balance is about the same overall. Exercises have been going well, has been doing them at work. Has been doing lots of standing at work. Notes some improvement in STS transfers. No other new updates.    EVAL: Pt endorses ongoing issues w/ balance over last couple of years without clear precipitating factor. He endorses worsening over past six months with 1-2 near falls every day, but no recent falls. He states his legs generally feel weak, particularly in the mornings and after prolonged positioning, with tendency to have trouble picking up his feet. He also reports difficulty w/ bed and chair transfers, feels like sometimes he will just keep going and has difficulty righting himself. He endorses difficulty with turns, unlevel ground. Has to be careful when carrying items. Also has a dog and notes that between dog toys and the dog moving around he will sometimes nearly trip. He does report a history of L sided LE nerve issues for which he used to use a cane.  He denies any dizziness, visual changes. He denies LE N/T, although he does note sometimes he will get LUE numbness when he turns his head. He denies bowel/bladder symptoms, fevers/chills, or recent unexplained weight loss. Does report some variable BP (both high and low) but states he is managing this with PCP.    PERTINENT HISTORY:   anxiety/depression, basal cell carcinoma, chronic back pain w/ spinal cord stimulator, CAD s/p DES, DM2, fibromyalgia, HTN, hep B, hx SBO, hx lumbar fusion, sleep apnea, hx urinary incontinence  PAIN:  Denies any significant issues w/ pain  PRECAUTIONS: cardiac hx, spinal fusion and spinal cord stimulator.  implanted sleep  apnea device (per pt report)    RED FLAGS: None   WEIGHT BEARING RESTRICTIONS: No  FALLS:  Has patient fallen in last 6 months? No - has had near falls frequently (1-2 a day) recently  LIVING ENVIRONMENT: Apartment, has dog, no STE Grab bar in shower, no other equipment  OCCUPATION: starting a new job at [employer] soon. retired respiratory therapist for pediatric critical care  PLOF: Independent  PATIENT GOALS: better strength in legs, ways to maintain balance   NEXT MD VISIT: 2 weeks   OBJECTIVE:  Note: Objective measures were completed at Evaluation unless otherwise noted.  DIAGNOSTIC FINDINGS:  No recent imaging in chart  PATIENT SURVEYS:  ABC: 8.7/16, 54%   COGNITION: Overall cognitive status: Within functional limits for tasks assessed     SENSATION/NEURO: Light touch intact BIL LE Negative hoffmann and tromner sign BIL No ataxia with gait FMC intact BIL UE Heel<>shin affected by weakness, no ataxia noted, symmetrical BIL   STRENGTH TESTING:  MMT Right eval Left eval  Shoulder flexion    Shoulder abduction    Elbow flexion    Elbow extension    Grip strength (gross)    Hip flexion 4- 4-  Hip abduction (modified sitting) 4+ 4+  Knee flexion 4 4-  Knee extension 4 4-  Ankle dorsiflexion 4 4  Ankle plantarflexion     (Blank rows = not tested) (Key: WFL = within functional limits not formally assessed, * = concordant pain, s = stiffness/stretching sensation, NT = not tested)  Comments:     FUNCTIONAL TESTS:   FUNCTIONAL GAIT ASSESSMENT:  ITEM EVAL  1 Gait Level Surface Normal 3   2 Change in Gait Speed mild impairment 2   3 Gait with Horizontal Head Turns Normal 3  4 Gait with Vertical Head Turns Normal 3   5 Gait with Pivot Turn mild impairment 2  6 Step Over Obstacle moderate impairment 1    7 Gait with Narrow Base of Support severe impairment 0  8 Gait with Eyes Closed moderate impairment 1    9 Ambulating Backwards moderate impairment  1    10 Steps moderate impairment 1    Total: 17/30   * Score of <=22/30 indicates that patient is at increased risk for falls.   5xSTS: 19.08sec w UE support   GAIT: Distance walked: within clinic Assistive device utilized: None Level of assistance: Complete Independence Comments: mildly widened BOS, reduced truncal rotation and arm swing BIL    [facility] Adult PT Treatment:                                                DATE: 06/08/24  Neuromuscular re-ed: Cone tap x8 BIL Cone double tap x8 BIL  Reactive cone taps SLS x30sec BIL UE support  SLS orange ball lateral dribble x8 UE support 2 laps gait + vertical ball toss (dual tasking) CGA, second task w/ counting up by 5s for cognitive load  Fwd walking w/ ball toss and catch varying angle 2 laps (reactive)  CGA  Tandem walk at counter 1 lap UE support PRN SBA  Therapeutic Activity: STS from raised mat (~24 inches) 2x8 no UE support; second round with arms outstretched to encourage fwd weight shift  Education/discussion re: safe transfer mechanics and appropriate compensations    [facility] Adult PT Treatment:                                                DATE: 06/02/24 Therapeutic Exercise: Seated GB hip abd 2x12 Seated hip add iso 2x12 Seated TKE push into bosu 2x8 BIL cues for breath control  HEP discussion/education  Neuromuscular re-ed: Slow march weaning UE support (unilat) 2x8 BIL CGA as support weans, cues for posture and slow velocity (5)cone weaving 4 laps cues for >25%  (5) cone weaving stop go reactive balance 3 laps 2 laps gait + vertical ball toss (dual tasking) CGA    [facility] Adult PT Treatment:                                                DATE: 05/31/2024 Neuromuscular re-ed: Seated: Hip add ball squeezes 10x5 Hip abd with isometric hold + green TB 10x5 Marching + green TB 2x20 Modified tandem balance Therapeutic Activity: Sit to stand x10 + UE assist Counter: Heel raises x20 Toe raises 10x3 Mini  squats + hip hinge mechanics x12 Walking + head turns Cone weaving  Lateral weaving through cones Railing: Standing HS stretch at 12 step Hip abduction x10 (bil) Knee flexion x10 (bil) Standing --> seated straight leg raise review    PATIENT EDUCATION:  Education details: rationale for interventions, HEP  Person educated: Patient Education method: Explanation, Demonstration, Tactile cues, Verbal cues Education comprehension: verbalized understanding, returned demonstration, verbal cues required, tactile cues required, and needs further education     HOME EXERCISE PROGRAM: Access  Code: [email or web address] URL: [email or web address] Date: 06/08/2024 Prepared by: Alm Jenny  Exercises - Sit to Stand with Armchair  - 2-3 x daily - 1 sets - 5-8 reps - Seated Hip Adduction Isometrics with Ball  - 1 x daily - 7 x weekly - 1-3 sets - 10 reps - Seated Hip Abduction with Resistance  - 1 x daily - 7 x weekly - 1-3 sets - 10 reps - 5 sec hold - Seated Straight Leg Raise   - 1 x daily - 7 x weekly - 1-3 sets - 10 reps - 3-5 sec hold - Alternating Step Taps with Counter Support  - 1 x daily - 7 x weekly - 1-3 sets - 8 reps - Tandem Walking with Counter Support  - 1 x daily - 7 x weekly - 1-3 sets - 5-10 reps  ASSESSMENT:  CLINICAL IMPRESSION: 06/08/2024: Pt arrives w/ report of balance about the same but noted improvement in transfers - no issues after last session. Today continuing with themes of lower body stability in closed chain, dual tasking, dynamic postural stability with challenged BOS. Tolerates well, intermittent CGA particularly for dynamic postural stability work but no LOB. No adverse events, no pain. Notes reduced fatigue compared to last session. Recommend continuing along current POC in order to address relevant deficits and improve functional tolerance. Pt departs today's session in no acute distress, all voiced questions/concerns addressed appropriately from PT  perspective.       EVAL: Patient is a pleasant 68 y.o. gentleman who was seen today for physical therapy evaluation and treatment for gait instability ongoing over past two years, worsening over past six months. No overt red flags in discussion/screening today. On exam he demonstrates global weakness of BIL LE L>R. Sensation and coordination appear intact w/ basic neuro screen. 5xSTS is indicative of fall risk and requires UE support. FGA 17/30 which is indicative of fall risk, most difficulty noted with turns, altered BOS, and vision affected portions. He also demonstrates compensations throughout with noted reduction in velocity when postural stability challenged. Tolerates exam/HEP well, education as above on safe HEP performance and introductory fall risk reduction. No adverse events. Recommend trial of skilled PT to address aforementioned deficits with aim of improving functional tolerance and reducing pain with typical activities. Pt departs today's session in no acute distress, all voiced concerns/questions addressed appropriately from PT perspective.      OBJECTIVE IMPAIRMENTS: Abnormal gait, decreased activity tolerance, decreased balance, decreased endurance, decreased mobility, difficulty walking, decreased strength, and improper body mechanics.   ACTIVITY LIMITATIONS: carrying, lifting, bending, stairs, transfers, bed mobility, and locomotion level  PARTICIPATION LIMITATIONS: cleaning, laundry, community activity, and occupation  PERSONAL FACTORS: Age, Time since onset of injury/illness/exacerbation, and 3+ comorbidities: anxiety/depression, basal cell carcinoma, chronic back pain w/ spinal cord stimulator, CAD s/p DES, DM2, fibromyalgia, HTN, hep B, hx SBO, hx lumbar fusion, sleep apnea, hx urinary incontinence are also affecting patient's functional outcome.   REHAB POTENTIAL: Fair given chronicity and comorbidities  CLINICAL DECISION MAKING: Evolving/moderate complexity  EVALUATION  COMPLEXITY: Moderate   GOALS:   SHORT TERM GOALS: Target date: 06/24/2024  Pt will demonstrate appropriate understanding and performance of initially prescribed HEP in order to facilitate improved independence with management of symptoms.  Baseline: HEP established  Goal status: INITIAL   2. Pt will improve to at least 15sec or less on 5xSTS in order to indicate reduced fall risk.  Baseline: 19sec w UE support  Goal status: INITIAL   LONG TERM GOALS:  Target date: 07/22/2024   Pt will score at least 70% on ABC Scale in order to demonstrate reduced fear of falling. MCID (7-23%) Baseline: 8.7/16, 54%  Goal status: INITIAL  2.  Pt will demonstrate LE MMT of at least 4+/5 in tested groups in order to facilitate improved functional strength.  Baseline: see MMT chart above  Goal status: INITIAL   3.  Pt will score greater than or equal to 22/30 on Functional Gait assessment in order to indicate reduced fall risk (cutoff score </= 22/30 predictive of falls per Willye et al 2010, MCID 4 pts Beninato et al 2014)  Baseline: 17/30  Goal status: INITIAL   4. Pt will perform 5xSTS in </=12 sec w/ or w/o UE support in order to demonstrate reduced fall risk and improved functional independence. (MCID of 2.3sec)  Baseline: 19sec w/ UE support  Goal status: INITIAL   5. Pt will demonstrate appropriate performance of final prescribed HEP in order to facilitate improved self-management of symptoms post-discharge.   Baseline: initial HEP prescribed  Goal status: INITIAL     PLAN:  PT FREQUENCY: 1-2x/week  PT DURATION: 8 weeks  PLANNED INTERVENTIONS: [id number]- PT Re-evaluation, 97750- Physical Performance Testing, 97110-Therapeutic exercises, 97530- Therapeutic activity, [id number]- Neuromuscular re-education, 97535- Self Care, [id number]- Manual therapy, [phone]- Gait training, Patient/Family education, Balance training, Stair training, Taping, Vestibular training, Cryotherapy, and Moist heat.  PLAN FOR  NEXT SESSION: Review/update HEP PRN. Work on balance/strength exercises as appropriate with emphasis on altered BOS (static and dynamic), generalized LE strength, dual tasking. May be helpful to look at righting reactions and reactive postural stability. Mindful of spinal cord stimulator, cardiac hx, implanted sleep apnea device, fusion   Alm DELENA Jenny PT, DPT 06/08/2024 8:47 AM

## 2024-06-10 ENCOUNTER — Ambulatory Visit: Payer: Medicare (Managed Care)

## 2024-06-10 ENCOUNTER — Other Ambulatory Visit (HOSPITAL_COMMUNITY): Payer: Self-pay

## 2024-06-10 ENCOUNTER — Ambulatory Visit: Payer: Medicare (Managed Care) | Admitting: Physical Therapy

## 2024-06-11 NOTE — Therapy (Incomplete)
 OUTPATIENT PHYSICAL THERAPY TREATMENT   Patient Name: Brandon [name] MRN: [id number] DOB:09-23-1955, 68 y.o., male Today's Date: 06/11/2024  END OF SESSION:      Past Medical History:  Diagnosis Date   Anxiety    Arthritis    Basal cell carcinoma    Basal cell carcinoma (BCC) in situ of skin    left neck   BCC (basal cell carcinoma of skin) 01/14/2024   On vertex scalp treated with Mohs 12/31/23    Chronic back pain    has spinal cord stimulator and wears buprenorphine  patch   Colon polyps    Coronary artery disease    S/p DES to mRCA in 12/21 // S/p DES to LCx x 2 in 12/2020 // Diff dz in small to mod Dx (not a good target for PCI); OM1 100 CTO; severe dz in small OM2 and PDA >> Med Rx   Depression    Diabetes mellitus without complication (HCC)    type 2   Fibromyalgia    Gallstones    GERD (gastroesophageal reflux disease)    Hepatitis B    HLD (hyperlipidemia)    Hypertension    Osteoarthritis    Pneumonia    PONV (postoperative nausea and vomiting)    SBO (small bowel obstruction) (HCC)    Sleep apnea    does not use CPAP   Past Surgical History:  Procedure Laterality Date   CARDIAC CATHETERIZATION  01/17/2021   CATARACT EXTRACTION W/ INTRAOCULAR LENS  IMPLANT, BILATERAL     CHOLECYSTECTOMY     CHOLECYSTECTOMY, LAPAROSCOPIC     COLONOSCOPY WITH PROPOFOL  N/A 12/19/2022   Procedure: COLONOSCOPY WITH PROPOFOL ;  Surgeon: Stacia Glendia BRAVO, MD;  Location: [facility] ENDOSCOPY;  Service: Gastroenterology;  Laterality: N/A;   CORONARY STENT INTERVENTION N/A 08/15/2020   Procedure: CORONARY STENT INTERVENTION;  Surgeon: Dann Candyce RAMAN, MD;  Location: [facility] INVASIVE CV LAB;  Service: Cardiovascular;  Laterality: N/A;   CORONARY STENT INTERVENTION N/A 01/17/2021   Procedure: CORONARY STENT INTERVENTION;  Surgeon: Verlin Lonni BIRCH, MD;  Location: [facility] INVASIVE CV LAB;  Service: Cardiovascular;  Laterality: N/A;   CORONARY ULTRASOUND/IVUS N/A 08/15/2020   Procedure:  Intravascular Ultrasound/IVUS;  Surgeon: Dann Candyce RAMAN, MD;  Location: [facility] INVASIVE CV LAB;  Service: Cardiovascular;  Laterality: N/A;   DRUG INDUCED ENDOSCOPY N/A 05/23/2022   Procedure: DRUG INDUCED SLEEP ENDOSCOPY;  Surgeon: Mable Lenis, MD;  Location: [facility] OR;  Service: ENT;  Laterality: N/A;   HERNIA REPAIR     IMPLANTATION OF HYPOGLOSSAL NERVE STIMULATOR Right 07/30/2022   Procedure: IMPLANTATION OF HYPOGLOSSAL NERVE STIMULATOR;  Surgeon: Mable Lenis, MD;  Location: [facility];  Service: ENT;  Laterality: Right;   LEFT HEART CATH AND CORONARY ANGIOGRAPHY N/A 08/15/2020   Procedure: LEFT HEART CATH AND CORONARY ANGIOGRAPHY;  Surgeon: Dann Candyce RAMAN, MD;  Location: [facility] INVASIVE CV LAB;  Service: Cardiovascular;  Laterality: N/A;   LEFT HEART CATH AND CORONARY ANGIOGRAPHY N/A 01/17/2021   Procedure: LEFT HEART CATH AND CORONARY ANGIOGRAPHY;  Surgeon: Verlin Lonni BIRCH, MD;  Location: [facility] INVASIVE CV LAB;  Service: Cardiovascular;  Laterality: N/A;   LUMBAR FUSION     L3-5 with rods   MICRODISCECTOMY LUMBAR  2006   POLYPECTOMY  12/19/2022   Procedure: POLYPECTOMY;  Surgeon: Stacia Glendia BRAVO, MD;  Location: [facility] ENDOSCOPY;  Service: Gastroenterology;;   SPINAL CORD STIMULATOR INSERTION     blood clot removed from spinal cord   SPINE SURGERY     blood  clot removed from spinal cord   XI ROBOTIC ASSISTED INGUINAL HERNIA REPAIR WITH MESH     x 3 surgeries   Patient Active Problem List   Diagnosis Date Noted   Urinary frequency 05/10/2024   Urinary incontinence 02/29/2024   Encounter for HIV pre-exposure prophylaxis 02/29/2024   BCC (basal cell carcinoma of skin) 01/14/2024   Cellulitis of left lower extremity 12/16/2023   Injury of face 10/30/2023   Fall 09/17/2023   Right wrist pain 09/17/2023   Acute pain of right knee 09/17/2023   Right facial pain 09/17/2023   Laceration of right eyebrow 09/17/2023   Pre-ulcerative corn or callous 07/29/2023    Morbid obesity (HCC) 07/29/2023   Chronic back pain 06/08/2023   BPH (benign prostatic hyperplasia) 06/08/2023   GERD (gastroesophageal reflux disease) 06/08/2023   History of colonic polyps 12/19/2022   Family history of colorectal cancer 12/19/2022   Nocturnal oxygen  desaturation 11/06/2022   Neuroforaminal stenosis of cervical spine 05/02/2022   Well adult exam 03/26/2022   AMS (altered mental status) 11/01/2021   Community acquired pneumonia 10/31/2021   Coughing 10/29/2021   Pain management contract signed 10/11/2021   History of lumbar fusion (L4/5) 10/02/2021   Chronic pain syndrome 06/01/2021   Obstructive sleep apnea 05/28/2021   Metallic taste 05/21/2021   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   Acute pain of left shoulder 04/03/2021   Unstable angina (HCC)    Insomnia 09/03/2020   Status post coronary artery stent placement    Mixed hyperlipidemia    Coronary artery disease    Chest pain 08/11/2020   Lumbar facet arthropathy 06/22/2020   Major depression in partial remission 06/04/2020   Hyperlipidemia associated with type 2 diabetes mellitus (HCC) 04/14/2020   Primary hypertension 04/07/2020   Diabetes mellitus type 2, uncomplicated, on long term insulin  pump (HCC) 04/07/2020   Spinal cord stimulator status 05/19/2017   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   Erectile dysfunction 10/09/2015    PCP: Alvia Bring, DO  REFERRING PROVIDER: Alvia Bring, DO  REFERRING DIAG: R26.81 (ICD-10-CM) - Gait instability  Rationale for Evaluation and Treatment: Rehabilitation  THERAPY DIAG:  No diagnosis found.  ONSET DATE: last couple years  SUBJECTIVE:                                                                                                                                                                                           SUBJECTIVE STATEMENT: 06/11/2024: ***  *** felt a bit better after last session. Feels like balance is about the same overall. Exercises  have been going well, has been doing them at work. Has been doing lots of  standing at work. Notes some improvement in STS transfers. No other new updates.    EVAL: Pt endorses ongoing issues w/ balance over last couple of years without clear precipitating factor. He endorses worsening over past six months with 1-2 near falls every day, but no recent falls. He states his legs generally feel weak, particularly in the mornings and after prolonged positioning, with tendency to have trouble picking up his feet. He also reports difficulty w/ bed and chair transfers, feels like sometimes he will just keep going and has difficulty righting himself. He endorses difficulty with turns, unlevel ground. Has to be careful when carrying items. Also has a dog and notes that between dog toys and the dog moving around he will sometimes nearly trip. He does report a history of L sided LE nerve issues for which he used to use a cane.  He denies any dizziness, visual changes. He denies LE N/T, although he does note sometimes he will get LUE numbness when he turns his head. He denies bowel/bladder symptoms, fevers/chills, or recent unexplained weight loss. Does report some variable BP (both high and low) but states he is managing this with PCP.    PERTINENT HISTORY:   anxiety/depression, basal cell carcinoma, chronic back pain w/ spinal cord stimulator, CAD s/p DES, DM2, fibromyalgia, HTN, hep B, hx SBO, hx lumbar fusion, sleep apnea, hx urinary incontinence  PAIN:  Denies any significant issues w/ pain  PRECAUTIONS: cardiac hx, spinal fusion and spinal cord stimulator.  implanted sleep apnea device (per pt report)    RED FLAGS: None   WEIGHT BEARING RESTRICTIONS: No  FALLS:  Has patient fallen in last 6 months? No - has had near falls frequently (1-2 a day) recently  LIVING ENVIRONMENT: Apartment, has dog, no STE Grab bar in shower, no other equipment  OCCUPATION: starting a new job at [employer] soon.  retired respiratory therapist for pediatric critical care  PLOF: Independent  PATIENT GOALS: better strength in legs, ways to maintain balance   NEXT MD VISIT: 2 weeks   OBJECTIVE:  Note: Objective measures were completed at Evaluation unless otherwise noted.  DIAGNOSTIC FINDINGS:  No recent imaging in chart  PATIENT SURVEYS:  ABC: 8.7/16, 54%   COGNITION: Overall cognitive status: Within functional limits for tasks assessed     SENSATION/NEURO: Light touch intact BIL LE Negative hoffmann and tromner sign BIL No ataxia with gait FMC intact BIL UE Heel<>shin affected by weakness, no ataxia noted, symmetrical BIL   STRENGTH TESTING:  MMT Right eval Left eval  Shoulder flexion    Shoulder abduction    Elbow flexion    Elbow extension    Grip strength (gross)    Hip flexion 4- 4-  Hip abduction (modified sitting) 4+ 4+  Knee flexion 4 4-  Knee extension 4 4-  Ankle dorsiflexion 4 4  Ankle plantarflexion     (Blank rows = not tested) (Key: WFL = within functional limits not formally assessed, * = concordant pain, s = stiffness/stretching sensation, NT = not tested)  Comments:     FUNCTIONAL TESTS:   FUNCTIONAL GAIT ASSESSMENT:  ITEM EVAL  1 Gait Level Surface Normal 3   2 Change in Gait Speed mild impairment 2   3 Gait with Horizontal Head Turns Normal 3   4 Gait with Vertical Head Turns Normal 3   5 Gait with Pivot Turn mild impairment 2  6 Step Over Obstacle moderate impairment 1    7 Gait with Narrow  Base of Support severe impairment 0  8 Gait with Eyes Closed moderate impairment 1    9 Ambulating Backwards moderate impairment 1    10 Steps moderate impairment 1    Total: 17/30   * Score of <=22/30 indicates that patient is at increased risk for falls.   5xSTS: 19.08sec w UE support   GAIT: Distance walked: within clinic Assistive device utilized: None Level of assistance: Complete Independence Comments: mildly widened BOS, reduced truncal  rotation and arm swing BIL    [facility] Adult PT Treatment:                                                DATE: 06/14/24 Therapeutic Exercise: *** Manual Therapy: *** Neuromuscular re-ed: *** Therapeutic Activity: *** Modalities: *** Self Care: ***     [facility] Adult PT Treatment:                                                DATE: 06/08/24  Neuromuscular re-ed: Cone tap x8 BIL Cone double tap x8 BIL  Reactive cone taps SLS x30sec BIL UE support  SLS orange ball lateral dribble x8 UE support 2 laps gait + vertical ball toss (dual tasking) CGA, second task w/ counting up by 5s for cognitive load  Fwd walking w/ ball toss and catch varying angle 2 laps (reactive)  CGA  Tandem walk at counter 1 lap UE support PRN SBA  Therapeutic Activity: STS from raised mat (~24 inches) 2x8 no UE support; second round with arms outstretched to encourage fwd weight shift  Education/discussion re: safe transfer mechanics and appropriate compensations    [facility] Adult PT Treatment:                                                DATE: 06/02/24 Therapeutic Exercise: Seated GB hip abd 2x12 Seated hip add iso 2x12 Seated TKE push into bosu 2x8 BIL cues for breath control  HEP discussion/education  Neuromuscular re-ed: Slow march weaning UE support (unilat) 2x8 BIL CGA as support weans, cues for posture and slow velocity (5)cone weaving 4 laps cues for >25%  (5) cone weaving stop go reactive balance 3 laps 2 laps gait + vertical ball toss (dual tasking) CGA    PATIENT EDUCATION:  Education details: rationale for interventions, HEP  Person educated: Patient Education method: Explanation, Demonstration, Tactile cues, Verbal cues Education comprehension: verbalized understanding, returned demonstration, verbal cues required, tactile cues required, and needs further education     HOME EXERCISE PROGRAM: Access Code: [email or web address] URL: [email or web address] Date: 06/08/2024 Prepared by:  Alm Jenny  Exercises - Sit to Stand with Armchair  - 2-3 x daily - 1 sets - 5-8 reps - Seated Hip Adduction Isometrics with Ball  - 1 x daily - 7 x weekly - 1-3 sets - 10 reps - Seated Hip Abduction with Resistance  - 1 x daily - 7 x weekly - 1-3 sets - 10 reps - 5 sec hold - Seated Straight Leg Raise   - 1 x daily - 7 x weekly - 1-3 sets - 10  reps - 3-5 sec hold - Alternating Step Taps with Counter Support  - 1 x daily - 7 x weekly - 1-3 sets - 8 reps - Tandem Walking with Counter Support  - 1 x daily - 7 x weekly - 1-3 sets - 5-10 reps  ASSESSMENT:  CLINICAL IMPRESSION: 06/11/2024: ***  *** Pt arrives w/ report of balance about the same but noted improvement in transfers - no issues after last session. Today continuing with themes of lower body stability in closed chain, dual tasking, dynamic postural stability with challenged BOS. Tolerates well, intermittent CGA particularly for dynamic postural stability work but no LOB. No adverse events, no pain. Notes reduced fatigue compared to last session. Recommend continuing along current POC in order to address relevant deficits and improve functional tolerance. Pt departs today's session in no acute distress, all voiced questions/concerns addressed appropriately from PT perspective.       EVAL: Patient is a pleasant 68 y.o. gentleman who was seen today for physical therapy evaluation and treatment for gait instability ongoing over past two years, worsening over past six months. No overt red flags in discussion/screening today. On exam he demonstrates global weakness of BIL LE L>R. Sensation and coordination appear intact w/ basic neuro screen. 5xSTS is indicative of fall risk and requires UE support. FGA 17/30 which is indicative of fall risk, most difficulty noted with turns, altered BOS, and vision affected portions. He also demonstrates compensations throughout with noted reduction in velocity when postural stability challenged. Tolerates  exam/HEP well, education as above on safe HEP performance and introductory fall risk reduction. No adverse events. Recommend trial of skilled PT to address aforementioned deficits with aim of improving functional tolerance and reducing pain with typical activities. Pt departs today's session in no acute distress, all voiced concerns/questions addressed appropriately from PT perspective.      OBJECTIVE IMPAIRMENTS: Abnormal gait, decreased activity tolerance, decreased balance, decreased endurance, decreased mobility, difficulty walking, decreased strength, and improper body mechanics.   ACTIVITY LIMITATIONS: carrying, lifting, bending, stairs, transfers, bed mobility, and locomotion level  PARTICIPATION LIMITATIONS: cleaning, laundry, community activity, and occupation  PERSONAL FACTORS: Age, Time since onset of injury/illness/exacerbation, and 3+ comorbidities: anxiety/depression, basal cell carcinoma, chronic back pain w/ spinal cord stimulator, CAD s/p DES, DM2, fibromyalgia, HTN, hep B, hx SBO, hx lumbar fusion, sleep apnea, hx urinary incontinence are also affecting patient's functional outcome.   REHAB POTENTIAL: Fair given chronicity and comorbidities  CLINICAL DECISION MAKING: Evolving/moderate complexity  EVALUATION COMPLEXITY: Moderate   GOALS:   SHORT TERM GOALS: Target date: 06/24/2024  Pt will demonstrate appropriate understanding and performance of initially prescribed HEP in order to facilitate improved independence with management of symptoms.  Baseline: HEP established  Goal status: INITIAL   2. Pt will improve to at least 15sec or less on 5xSTS in order to indicate reduced fall risk.  Baseline: 19sec w UE support  Goal status: INITIAL   LONG TERM GOALS: Target date: 07/22/2024   Pt will score at least 70% on ABC Scale in order to demonstrate reduced fear of falling. MCID (7-23%) Baseline: 8.7/16, 54%  Goal status: INITIAL  2.  Pt will demonstrate LE MMT of at  least 4+/5 in tested groups in order to facilitate improved functional strength.  Baseline: see MMT chart above  Goal status: INITIAL   3.  Pt will score greater than or equal to 22/30 on Functional Gait assessment in order to indicate reduced fall risk (cutoff score </= 22/30 predictive of  falls per Willye et al 2010, MCID 4 pts Beninato et al 2014)  Baseline: 17/30  Goal status: INITIAL   4. Pt will perform 5xSTS in </=12 sec w/ or w/o UE support in order to demonstrate reduced fall risk and improved functional independence. (MCID of 2.3sec)  Baseline: 19sec w/ UE support  Goal status: INITIAL   5. Pt will demonstrate appropriate performance of final prescribed HEP in order to facilitate improved self-management of symptoms post-discharge.   Baseline: initial HEP prescribed  Goal status: INITIAL     PLAN:  PT FREQUENCY: 1-2x/week  PT DURATION: 8 weeks  PLANNED INTERVENTIONS: [id number]- PT Re-evaluation, 97750- Physical Performance Testing, 97110-Therapeutic exercises, 97530- Therapeutic activity, [id number]- Neuromuscular re-education, 97535- Self Care, [id number]- Manual therapy, [phone]- Gait training, Patient/Family education, Balance training, Stair training, Taping, Vestibular training, Cryotherapy, and Moist heat.  PLAN FOR NEXT SESSION: Review/update HEP PRN. Work on balance/strength exercises as appropriate with emphasis on altered BOS (static and dynamic), generalized LE strength, dual tasking. May be helpful to look at righting reactions and reactive postural stability. Mindful of spinal cord stimulator, cardiac hx, implanted sleep apnea device, fusion   Alm DELENA Jenny PT, DPT 06/11/2024 8:40 AM

## 2024-06-14 ENCOUNTER — Ambulatory Visit: Payer: Medicare (Managed Care) | Admitting: Physical Therapy

## 2024-06-15 ENCOUNTER — Ambulatory Visit: Payer: Medicare (Managed Care)

## 2024-06-15 DIAGNOSIS — R2681 Unsteadiness on feet: Secondary | ICD-10-CM | POA: Diagnosis not present

## 2024-06-15 DIAGNOSIS — M6281 Muscle weakness (generalized): Secondary | ICD-10-CM

## 2024-06-15 NOTE — Therapy (Signed)
 OUTPATIENT PHYSICAL THERAPY TREATMENT   Patient Name: Brandon [name] MRN: [id number] DOB:1955-09-29, 68 y.o., male Today's Date: 06/15/2024  END OF SESSION:  PT End of Session - 06/15/24 0756     Visit Number 5    Number of Visits 17    Date for Recertification  07/22/24    Authorization Type cign[employer] medicare    PT Start Time 0800    PT Stop Time 0845    PT Time Calculation (min) 45 min    Activity Tolerance Patient tolerated treatment well    Behavior During Therapy [facility] for tasks assessed/performed         Past Medical History:  Diagnosis Date   Anxiety    Arthritis    Basal cell carcinoma    Basal cell carcinoma (BCC) in situ of skin    left neck   BCC (basal cell carcinoma of skin) 01/14/2024   On vertex scalp treated with Mohs 12/31/23    Chronic back pain    has spinal cord stimulator and wears buprenorphine  patch   Colon polyps    Coronary artery disease    S/p DES to mRCA in 12/21 // S/p DES to LCx x 2 in 12/2020 // Diff dz in small to mod Dx (not a good target for PCI); OM1 100 CTO; severe dz in small OM2 and PDA >> Med Rx   Depression    Diabetes mellitus without complication (HCC)    type 2   Fibromyalgia    Gallstones    GERD (gastroesophageal reflux disease)    Hepatitis B    HLD (hyperlipidemia)    Hypertension    Osteoarthritis    Pneumonia    PONV (postoperative nausea and vomiting)    SBO (small bowel obstruction) (HCC)    Sleep apnea    does not use CPAP   Past Surgical History:  Procedure Laterality Date   CARDIAC CATHETERIZATION  01/17/2021   CATARACT EXTRACTION W/ INTRAOCULAR LENS  IMPLANT, BILATERAL     CHOLECYSTECTOMY     CHOLECYSTECTOMY, LAPAROSCOPIC     COLONOSCOPY WITH PROPOFOL  N/A 12/19/2022   Procedure: COLONOSCOPY WITH PROPOFOL ;  Surgeon: Stacia Glendia BRAVO, MD;  Location: [facility] ENDOSCOPY;  Service: Gastroenterology;  Laterality: N/A;   CORONARY STENT INTERVENTION N/A 08/15/2020   Procedure: CORONARY STENT INTERVENTION;  Surgeon:  Dann Candyce RAMAN, MD;  Location: [facility] INVASIVE CV LAB;  Service: Cardiovascular;  Laterality: N/A;   CORONARY STENT INTERVENTION N/A 01/17/2021   Procedure: CORONARY STENT INTERVENTION;  Surgeon: Verlin Lonni BIRCH, MD;  Location: [facility] INVASIVE CV LAB;  Service: Cardiovascular;  Laterality: N/A;   CORONARY ULTRASOUND/IVUS N/A 08/15/2020   Procedure: Intravascular Ultrasound/IVUS;  Surgeon: Dann Candyce RAMAN, MD;  Location: [facility] INVASIVE CV LAB;  Service: Cardiovascular;  Laterality: N/A;   DRUG INDUCED ENDOSCOPY N/A 05/23/2022   Procedure: DRUG INDUCED SLEEP ENDOSCOPY;  Surgeon: Mable Lenis, MD;  Location: [facility] OR;  Service: ENT;  Laterality: N/A;   HERNIA REPAIR     IMPLANTATION OF HYPOGLOSSAL NERVE STIMULATOR Right 07/30/2022   Procedure: IMPLANTATION OF HYPOGLOSSAL NERVE STIMULATOR;  Surgeon: Mable Lenis, MD;  Location: [facility];  Service: ENT;  Laterality: Right;   LEFT HEART CATH AND CORONARY ANGIOGRAPHY N/A 08/15/2020   Procedure: LEFT HEART CATH AND CORONARY ANGIOGRAPHY;  Surgeon: Dann Candyce RAMAN, MD;  Location: [facility] INVASIVE CV LAB;  Service: Cardiovascular;  Laterality: N/A;   LEFT HEART CATH AND CORONARY ANGIOGRAPHY N/A 01/17/2021   Procedure: LEFT HEART CATH AND CORONARY ANGIOGRAPHY;  Surgeon: Verlin Lonni BIRCH, MD;  Location: [facility] INVASIVE CV LAB;  Service: Cardiovascular;  Laterality: N/A;   LUMBAR FUSION     L3-5 with rods   MICRODISCECTOMY LUMBAR  2006   POLYPECTOMY  12/19/2022   Procedure: POLYPECTOMY;  Surgeon: Stacia Glendia BRAVO, MD;  Location: [facility] ENDOSCOPY;  Service: Gastroenterology;;   SPINAL CORD STIMULATOR INSERTION     blood clot removed from spinal cord   SPINE SURGERY     blood clot removed from spinal cord   XI ROBOTIC ASSISTED INGUINAL HERNIA REPAIR WITH MESH     x 3 surgeries   Patient Active Problem List   Diagnosis Date Noted   Urinary frequency 05/10/2024   Urinary incontinence 02/29/2024   Encounter for HIV  pre-exposure prophylaxis 02/29/2024   BCC (basal cell carcinoma of skin) 01/14/2024   Cellulitis of left lower extremity 12/16/2023   Injury of face 10/30/2023   Fall 09/17/2023   Right wrist pain 09/17/2023   Acute pain of right knee 09/17/2023   Right facial pain 09/17/2023   Laceration of right eyebrow 09/17/2023   Pre-ulcerative corn or callous 07/29/2023   Morbid obesity (HCC) 07/29/2023   Chronic back pain 06/08/2023   BPH (benign prostatic hyperplasia) 06/08/2023   GERD (gastroesophageal reflux disease) 06/08/2023   History of colonic polyps 12/19/2022   Family history of colorectal cancer 12/19/2022   Nocturnal oxygen  desaturation 11/06/2022   Neuroforaminal stenosis of cervical spine 05/02/2022   Well adult exam 03/26/2022   AMS (altered mental status) 11/01/2021   Community acquired pneumonia 10/31/2021   Coughing 10/29/2021   Pain management contract signed 10/11/2021   History of lumbar fusion (L4/5) 10/02/2021   Chronic pain syndrome 06/01/2021   Obstructive sleep apnea 05/28/2021   Metallic taste 05/21/2021   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   Acute pain of left shoulder 04/03/2021   Unstable angina (HCC)    Insomnia 09/03/2020   Status post coronary artery stent placement    Mixed hyperlipidemia    Coronary artery disease    Chest pain 08/11/2020   Lumbar facet arthropathy 06/22/2020   Major depression in partial remission 06/04/2020   Hyperlipidemia associated with type 2 diabetes mellitus (HCC) 04/14/2020   Primary hypertension 04/07/2020   Diabetes mellitus type 2, uncomplicated, on long term insulin  pump (HCC) 04/07/2020   Spinal cord stimulator status 05/19/2017   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   Erectile dysfunction 10/09/2015    PCP: Alvia Bring, DO  REFERRING PROVIDER: Alvia Bring, DO  REFERRING DIAG: R26.81 (ICD-10-CM) - Gait instability  Rationale for Evaluation and Treatment: Rehabilitation  THERAPY DIAG:  Unsteadiness on  feet  Muscle weakness (generalized)  ONSET DATE: last couple years  SUBJECTIVE:  SUBJECTIVE STATEMENT: Patient reports his balance is getting there, states when his blood sugar drops his balance feels more off.    EVAL: Pt endorses ongoing issues w/ balance over last couple of years without clear precipitating factor. He endorses worsening over past six months with 1-2 near falls every day, but no recent falls. He states his legs generally feel weak, particularly in the mornings and after prolonged positioning, with tendency to have trouble picking up his feet. He also reports difficulty w/ bed and chair transfers, feels like sometimes he will just keep going and has difficulty righting himself. He endorses difficulty with turns, unlevel ground. Has to be careful when carrying items. Also has a dog and notes that between dog toys and the dog moving around he will sometimes nearly trip. He does report a history of L sided LE nerve issues for which he used to use a cane.  He denies any dizziness, visual changes. He denies LE N/T, although he does note sometimes he will get LUE numbness when he turns his head. He denies bowel/bladder symptoms, fevers/chills, or recent unexplained weight loss. Does report some variable BP (both high and low) but states he is managing this with PCP.    PERTINENT HISTORY:   anxiety/depression, basal cell carcinoma, chronic back pain w/ spinal cord stimulator, CAD s/p DES, DM2, fibromyalgia, HTN, hep B, hx SBO, hx lumbar fusion, sleep apnea, hx urinary incontinence  PAIN:  Denies any significant issues w/ pain  PRECAUTIONS: cardiac hx, spinal fusion and spinal cord stimulator.  implanted sleep apnea device (per pt report)    RED FLAGS: None   WEIGHT BEARING RESTRICTIONS:  No  FALLS:  Has patient fallen in last 6 months? No - has had near falls frequently (1-2 a day) recently  LIVING ENVIRONMENT: Apartment, has dog, no STE Grab bar in shower, no other equipment  OCCUPATION: starting a new job at [employer] soon. retired respiratory therapist for pediatric critical care  PLOF: Independent  PATIENT GOALS: better strength in legs, ways to maintain balance   NEXT MD VISIT: 2 weeks   OBJECTIVE:  Note: Objective measures were completed at Evaluation unless otherwise noted.  DIAGNOSTIC FINDINGS:  No recent imaging in chart  PATIENT SURVEYS:  ABC: 8.7/16, 54%   COGNITION: Overall cognitive status: Within functional limits for tasks assessed     SENSATION/NEURO: Light touch intact BIL LE Negative hoffmann and tromner sign BIL No ataxia with gait FMC intact BIL UE Heel<>shin affected by weakness, no ataxia noted, symmetrical BIL   STRENGTH TESTING:  MMT Right eval Left eval  Shoulder flexion    Shoulder abduction    Elbow flexion    Elbow extension    Grip strength (gross)    Hip flexion 4- 4-  Hip abduction (modified sitting) 4+ 4+  Knee flexion 4 4-  Knee extension 4 4-  Ankle dorsiflexion 4 4  Ankle plantarflexion     (Blank rows = not tested) (Key: WFL = within functional limits not formally assessed, * = concordant pain, s = stiffness/stretching sensation, NT = not tested)  Comments:     FUNCTIONAL TESTS:   FUNCTIONAL GAIT ASSESSMENT:  ITEM EVAL  1 Gait Level Surface Normal 3   2 Change in Gait Speed mild impairment 2   3 Gait with Horizontal Head Turns Normal 3   4 Gait with Vertical Head Turns Normal 3   5 Gait with Pivot Turn mild impairment 2  6 Step Over Obstacle moderate impairment 1  7 Gait with Narrow Base of Support severe impairment 0  8 Gait with Eyes Closed moderate impairment 1    9 Ambulating Backwards moderate impairment 1    10 Steps moderate impairment 1    Total: 17/30   * Score of <=22/30  indicates that patient is at increased risk for falls.   5xSTS: 19.08sec w UE support   GAIT: Distance walked: within clinic Assistive device utilized: None Level of assistance: Complete Independence Comments: mildly widened BOS, reduced truncal rotation and arm swing BIL   [facility] Adult PT Treatment:                                                DATE: 06/15/2024 Neuromuscular re-ed: Toe taps on colored dots (clock set up) --> same side & crossing midline Seated straight leg raise (Lt) --> foot propped on block Seated LAQ + ball squeeze (bil) Seated TA activation Therapeutic Activity: Standing alternating heel/toe raises x15 Squats + hip hinge mechanics Sit to stand with no UE support --> working on anterior weight shifting Sit to stand + 1 hand push off from table --> bothered low back Forward rocking + arm reach over chair target Calf stretch variations at railing    [facility] Adult PT Treatment:                                                DATE: 06/08/24  Neuromuscular re-ed: Cone tap x8 BIL Cone double tap x8 BIL  Reactive cone taps SLS x30sec BIL UE support  SLS orange ball lateral dribble x8 UE support 2 laps gait + vertical ball toss (dual tasking) CGA, second task w/ counting up by 5s for cognitive load  Fwd walking w/ ball toss and catch varying angle 2 laps (reactive)  CGA  Tandem walk at counter 1 lap UE support PRN SBA  Therapeutic Activity: STS from raised mat (~24 inches) 2x8 no UE support; second round with arms outstretched to encourage fwd weight shift  Education/discussion re: safe transfer mechanics and appropriate compensations    [facility] Adult PT Treatment:                                                DATE: 06/02/24 Therapeutic Exercise: Seated GB hip abd 2x12 Seated hip add iso 2x12 Seated TKE push into bosu 2x8 BIL cues for breath control  HEP discussion/education  Neuromuscular re-ed: Slow march weaning UE support (unilat) 2x8 BIL CGA as support  weans, cues for posture and slow velocity (5)cone weaving 4 laps cues for >25%  (5) cone weaving stop go reactive balance 3 laps 2 laps gait + vertical ball toss (dual tasking) CGA    [facility] Adult PT Treatment:                                                DATE: 05/31/2024 Neuromuscular re-ed: Seated: Hip add ball squeezes 10x5 Hip abd with isometric hold + green TB  10x5 Marching + green TB 2x20 Modified tandem balance Therapeutic Activity: Sit to stand x10 + UE assist Counter: Heel raises x20 Toe raises 10x3 Mini squats + hip hinge mechanics x12 Walking + head turns Cone weaving  Lateral weaving through cones Railing: Standing HS stretch at 12 step Hip abduction x10 (bil) Knee flexion x10 (bil) Standing --> seated straight leg raise review    PATIENT EDUCATION:  Education details: rationale for interventions, HEP  Person educated: Patient Education method: Explanation, Demonstration, Tactile cues, Verbal cues Education comprehension: verbalized understanding, returned demonstration, verbal cues required, tactile cues required, and needs further education     HOME EXERCISE PROGRAM: Access Code: [email or web address] URL: [email or web address] Date: 06/15/2024 Prepared by: Lamarr Price  Exercises - Sit to Stand with Armchair  - 2-3 x daily - 1 sets - 5-8 reps - Seated Hip Abduction with Resistance  - 1 x daily - 7 x weekly - 1-3 sets - 10 reps - 5 sec hold - Seated Straight Leg Raise   - 1 x daily - 7 x weekly - 1-3 sets - 10 reps - 3-5 sec hold - Alternating Step Taps with Counter Support  - 1 x daily - 7 x weekly - 1-3 sets - 8 reps - Tandem Walking with Counter Support  - 1 x daily - 7 x weekly - 1-3 sets - 5-10 reps - Seated Long Arc Quad with Hip Adduction  - 1 x daily - 7 x weekly - 3 sets - 10 reps - Seated March with Resistance  - 1 x daily - 7 x weekly - 3 sets - 10 reps - Soleus Stretch on Wall  - 1 x daily - 7 x weekly - 3 sets - 10  reps  ASSESSMENT:  CLINICAL IMPRESSION: Focused on progressing forward rocking and anterior weight shifting with sit to stand transfers; recommended patient elevate seat on couch at home with cushion for easier transfers. Noted Lt LE weakness during single leg stance balance activity. LE strengthening exercises added to HEP focusing on quad activation and hip/glute strengthening.   EVAL: Patient is a pleasant 68 y.o. gentleman who was seen today for physical therapy evaluation and treatment for gait instability ongoing over past two years, worsening over past six months. No overt red flags in discussion/screening today. On exam he demonstrates global weakness of BIL LE L>R. Sensation and coordination appear intact w/ basic neuro screen. 5xSTS is indicative of fall risk and requires UE support. FGA 17/30 which is indicative of fall risk, most difficulty noted with turns, altered BOS, and vision affected portions. He also demonstrates compensations throughout with noted reduction in velocity when postural stability challenged. Tolerates exam/HEP well, education as above on safe HEP performance and introductory fall risk reduction. No adverse events. Recommend trial of skilled PT to address aforementioned deficits with aim of improving functional tolerance and reducing pain with typical activities. Pt departs today's session in no acute distress, all voiced concerns/questions addressed appropriately from PT perspective.      OBJECTIVE IMPAIRMENTS: Abnormal gait, decreased activity tolerance, decreased balance, decreased endurance, decreased mobility, difficulty walking, decreased strength, and improper body mechanics.   ACTIVITY LIMITATIONS: carrying, lifting, bending, stairs, transfers, bed mobility, and locomotion level  PARTICIPATION LIMITATIONS: cleaning, laundry, community activity, and occupation  PERSONAL FACTORS: Age, Time since onset of injury/illness/exacerbation, and 3+ comorbidities:  anxiety/depression, basal cell carcinoma, chronic back pain w/ spinal cord stimulator, CAD s/p DES, DM2, fibromyalgia, HTN, hep B, hx SBO, hx lumbar fusion, sleep apnea, hx  urinary incontinence are also affecting patient's functional outcome.   REHAB POTENTIAL: Fair given chronicity and comorbidities  CLINICAL DECISION MAKING: Evolving/moderate complexity  EVALUATION COMPLEXITY: Moderate   GOALS:   SHORT TERM GOALS: Target date: 06/24/2024  Pt will demonstrate appropriate understanding and performance of initially prescribed HEP in order to facilitate improved independence with management of symptoms.  Baseline: HEP established  Goal status: INITIAL   2. Pt will improve to at least 15sec or less on 5xSTS in order to indicate reduced fall risk.  Baseline: 19sec w UE support  Goal status: INITIAL   LONG TERM GOALS: Target date: 07/22/2024   Pt will score at least 70% on ABC Scale in order to demonstrate reduced fear of falling. MCID (7-23%) Baseline: 8.7/16, 54%  Goal status: INITIAL  2.  Pt will demonstrate LE MMT of at least 4+/5 in tested groups in order to facilitate improved functional strength.  Baseline: see MMT chart above  Goal status: INITIAL   3.  Pt will score greater than or equal to 22/30 on Functional Gait assessment in order to indicate reduced fall risk (cutoff score </= 22/30 predictive of falls per Willye et al 2010, MCID 4 pts Beninato et al 2014)  Baseline: 17/30  Goal status: INITIAL   4. Pt will perform 5xSTS in </=12 sec w/ or w/o UE support in order to demonstrate reduced fall risk and improved functional independence. (MCID of 2.3sec)  Baseline: 19sec w/ UE support  Goal status: INITIAL   5. Pt will demonstrate appropriate performance of final prescribed HEP in order to facilitate improved self-management of symptoms post-discharge.   Baseline: initial HEP prescribed  Goal status: INITIAL     PLAN:  PT FREQUENCY: 1-2x/week  PT DURATION: 8  weeks  PLANNED INTERVENTIONS: [id number]- PT Re-evaluation, 97750- Physical Performance Testing, 97110-Therapeutic exercises, 97530- Therapeutic activity, [id number]- Neuromuscular re-education, 97535- Self Care, [id number]- Manual therapy, [phone]- Gait training, Patient/Family education, Balance training, Stair training, Taping, Vestibular training, Cryotherapy, and Moist heat.  PLAN FOR NEXT SESSION: Review/update HEP PRN. Work on balance/strength exercises as appropriate with emphasis on altered BOS (static and dynamic), generalized LE strength, dual tasking. May be helpful to look at righting reactions and reactive postural stability. Mindful of spinal cord stimulator, cardiac hx, implanted sleep apnea device, fusion   Lamarr Price, PTA 06/15/2024 8:46 AM

## 2024-06-17 ENCOUNTER — Ambulatory Visit: Payer: Medicare (Managed Care)

## 2024-06-17 DIAGNOSIS — R2681 Unsteadiness on feet: Secondary | ICD-10-CM | POA: Diagnosis not present

## 2024-06-17 DIAGNOSIS — M6281 Muscle weakness (generalized): Secondary | ICD-10-CM

## 2024-06-17 NOTE — Therapy (Signed)
 OUTPATIENT PHYSICAL THERAPY TREATMENT   Patient Name: Brandon [name] MRN: [id number] DOB:1956/03/30, 68 y.o., male Today's Date: 06/17/2024  END OF SESSION:  PT End of Session - 06/17/24 1018     Visit Number 6    Number of Visits 17    Date for Recertification  07/22/24    Authorization Type [employer] medicare    Progress Note Due on Visit 10    PT Start Time 1020    PT Stop Time 1100    PT Time Calculation (min) 40 min    Activity Tolerance Patient tolerated treatment well    Behavior During Therapy WFL for tasks assessed/performed         Past Medical History:  Diagnosis Date   Anxiety    Arthritis    Basal cell carcinoma    Basal cell carcinoma (BCC) in situ of skin    left neck   BCC (basal cell carcinoma of skin) 01/14/2024   On vertex scalp treated with Mohs 12/31/23    Chronic back pain    has spinal cord stimulator and wears buprenorphine  patch   Colon polyps    Coronary artery disease    S/p DES to mRCA in 12/21 // S/p DES to LCx x 2 in 12/2020 // Diff dz in small to mod Dx (not a good target for PCI); OM1 100 CTO; severe dz in small OM2 and PDA >> Med Rx   Depression    Diabetes mellitus without complication (HCC)    type 2   Fibromyalgia    Gallstones    GERD (gastroesophageal reflux disease)    Hepatitis B    HLD (hyperlipidemia)    Hypertension    Osteoarthritis    Pneumonia    PONV (postoperative nausea and vomiting)    SBO (small bowel obstruction) (HCC)    Sleep apnea    does not use CPAP   Past Surgical History:  Procedure Laterality Date   CARDIAC CATHETERIZATION  01/17/2021   CATARACT EXTRACTION W/ INTRAOCULAR LENS  IMPLANT, BILATERAL     CHOLECYSTECTOMY     CHOLECYSTECTOMY, LAPAROSCOPIC     COLONOSCOPY WITH PROPOFOL  N/A 12/19/2022   Procedure: COLONOSCOPY WITH PROPOFOL ;  Surgeon: Stacia Glendia BRAVO, MD;  Location: [facility] ENDOSCOPY;  Service: Gastroenterology;  Laterality: N/A;   CORONARY STENT INTERVENTION N/A 08/15/2020   Procedure: CORONARY  STENT INTERVENTION;  Surgeon: Dann Candyce RAMAN, MD;  Location: [facility] INVASIVE CV LAB;  Service: Cardiovascular;  Laterality: N/A;   CORONARY STENT INTERVENTION N/A 01/17/2021   Procedure: CORONARY STENT INTERVENTION;  Surgeon: Verlin Lonni BIRCH, MD;  Location: [facility] INVASIVE CV LAB;  Service: Cardiovascular;  Laterality: N/A;   CORONARY ULTRASOUND/IVUS N/A 08/15/2020   Procedure: Intravascular Ultrasound/IVUS;  Surgeon: Dann Candyce RAMAN, MD;  Location: [facility] INVASIVE CV LAB;  Service: Cardiovascular;  Laterality: N/A;   DRUG INDUCED ENDOSCOPY N/A 05/23/2022   Procedure: DRUG INDUCED SLEEP ENDOSCOPY;  Surgeon: Mable Lenis, MD;  Location: [facility] OR;  Service: ENT;  Laterality: N/A;   HERNIA REPAIR     IMPLANTATION OF HYPOGLOSSAL NERVE STIMULATOR Right 07/30/2022   Procedure: IMPLANTATION OF HYPOGLOSSAL NERVE STIMULATOR;  Surgeon: Mable Lenis, MD;  Location: [facility];  Service: ENT;  Laterality: Right;   LEFT HEART CATH AND CORONARY ANGIOGRAPHY N/A 08/15/2020   Procedure: LEFT HEART CATH AND CORONARY ANGIOGRAPHY;  Surgeon: Dann Candyce RAMAN, MD;  Location: [facility] INVASIVE CV LAB;  Service: Cardiovascular;  Laterality: N/A;   LEFT HEART CATH AND CORONARY ANGIOGRAPHY N/A 01/17/2021  Procedure: LEFT HEART CATH AND CORONARY ANGIOGRAPHY;  Surgeon: Verlin Lonni BIRCH, MD;  Location: [facility] INVASIVE CV LAB;  Service: Cardiovascular;  Laterality: N/A;   LUMBAR FUSION     L3-5 with rods   MICRODISCECTOMY LUMBAR  2006   POLYPECTOMY  12/19/2022   Procedure: POLYPECTOMY;  Surgeon: Stacia Glendia BRAVO, MD;  Location: [facility] ENDOSCOPY;  Service: Gastroenterology;;   SPINAL CORD STIMULATOR INSERTION     blood clot removed from spinal cord   SPINE SURGERY     blood clot removed from spinal cord   XI ROBOTIC ASSISTED INGUINAL HERNIA REPAIR WITH MESH     x 3 surgeries   Patient Active Problem List   Diagnosis Date Noted   Urinary frequency 05/10/2024   Urinary incontinence 02/29/2024    Encounter for HIV pre-exposure prophylaxis 02/29/2024   BCC (basal cell carcinoma of skin) 01/14/2024   Cellulitis of left lower extremity 12/16/2023   Injury of face 10/30/2023   Fall 09/17/2023   Right wrist pain 09/17/2023   Acute pain of right knee 09/17/2023   Right facial pain 09/17/2023   Laceration of right eyebrow 09/17/2023   Pre-ulcerative corn or callous 07/29/2023   Morbid obesity (HCC) 07/29/2023   Chronic back pain 06/08/2023   BPH (benign prostatic hyperplasia) 06/08/2023   GERD (gastroesophageal reflux disease) 06/08/2023   History of colonic polyps 12/19/2022   Family history of colorectal cancer 12/19/2022   Nocturnal oxygen  desaturation 11/06/2022   Neuroforaminal stenosis of cervical spine 05/02/2022   Well adult exam 03/26/2022   AMS (altered mental status) 11/01/2021   Community acquired pneumonia 10/31/2021   Coughing 10/29/2021   Pain management contract signed 10/11/2021   History of lumbar fusion (L4/5) 10/02/2021   Chronic pain syndrome 06/01/2021   Obstructive sleep apnea 05/28/2021   Metallic taste 05/21/2021   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   Acute pain of left shoulder 04/03/2021   Unstable angina (HCC)    Insomnia 09/03/2020   Status post coronary artery stent placement    Mixed hyperlipidemia    Coronary artery disease    Chest pain 08/11/2020   Lumbar facet arthropathy 06/22/2020   Major depression in partial remission 06/04/2020   Hyperlipidemia associated with type 2 diabetes mellitus (HCC) 04/14/2020   Primary hypertension 04/07/2020   Diabetes mellitus type 2, uncomplicated, on long term insulin  pump (HCC) 04/07/2020   Spinal cord stimulator status 05/19/2017   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   Erectile dysfunction 10/09/2015    PCP: Alvia Bring, DO  REFERRING PROVIDER: Alvia Bring, DO  REFERRING DIAG: R26.81 (ICD-10-CM) - Gait instability  Rationale for Evaluation and Treatment: Rehabilitation  THERAPY DIAG:   Unsteadiness on feet  Muscle weakness (generalized)  ONSET DATE: last couple years  SUBJECTIVE:  SUBJECTIVE STATEMENT: Patient reports he was sore after last visit, states he was sore mainly in low back but also legs and glutes.    EVAL: Pt endorses ongoing issues w/ balance over last couple of years without clear precipitating factor. He endorses worsening over past six months with 1-2 near falls every day, but no recent falls. He states his legs generally feel weak, particularly in the mornings and after prolonged positioning, with tendency to have trouble picking up his feet. He also reports difficulty w/ bed and chair transfers, feels like sometimes he will just keep going and has difficulty righting himself. He endorses difficulty with turns, unlevel ground. Has to be careful when carrying items. Also has a dog and notes that between dog toys and the dog moving around he will sometimes nearly trip. He does report a history of L sided LE nerve issues for which he used to use a cane.  He denies any dizziness, visual changes. He denies LE N/T, although he does note sometimes he will get LUE numbness when he turns his head. He denies bowel/bladder symptoms, fevers/chills, or recent unexplained weight loss. Does report some variable BP (both high and low) but states he is managing this with PCP.    PERTINENT HISTORY:   anxiety/depression, basal cell carcinoma, chronic back pain w/ spinal cord stimulator, CAD s/p DES, DM2, fibromyalgia, HTN, hep B, hx SBO, hx lumbar fusion, sleep apnea, hx urinary incontinence  PAIN:  Denies any significant issues w/ pain  PRECAUTIONS: cardiac hx, spinal fusion and spinal cord stimulator.  implanted sleep apnea device (per pt report)    RED FLAGS: None   WEIGHT BEARING  RESTRICTIONS: No  FALLS:  Has patient fallen in last 6 months? No - has had near falls frequently (1-2 a day) recently  LIVING ENVIRONMENT: Apartment, has dog, no STE Grab bar in shower, no other equipment  OCCUPATION: starting a new job at [employer] soon. retired respiratory therapist for pediatric critical care  PLOF: Independent  PATIENT GOALS: better strength in legs, ways to maintain balance   NEXT MD VISIT: 2 weeks   OBJECTIVE:  Note: Objective measures were completed at Evaluation unless otherwise noted.  DIAGNOSTIC FINDINGS:  No recent imaging in chart  PATIENT SURVEYS:  ABC: 8.7/16, 54%   COGNITION: Overall cognitive status: Within functional limits for tasks assessed     SENSATION/NEURO: Light touch intact BIL LE Negative hoffmann and tromner sign BIL No ataxia with gait FMC intact BIL UE Heel<>shin affected by weakness, no ataxia noted, symmetrical BIL   STRENGTH TESTING:  MMT Right eval Left eval  Shoulder flexion    Shoulder abduction    Elbow flexion    Elbow extension    Grip strength (gross)    Hip flexion 4- 4-  Hip abduction (modified sitting) 4+ 4+  Knee flexion 4 4-  Knee extension 4 4-  Ankle dorsiflexion 4 4  Ankle plantarflexion     (Blank rows = not tested) (Key: WFL = within functional limits not formally assessed, * = concordant pain, s = stiffness/stretching sensation, NT = not tested)  Comments:     FUNCTIONAL TESTS:   FUNCTIONAL GAIT ASSESSMENT:  ITEM EVAL  1 Gait Level Surface Normal 3   2 Change in Gait Speed mild impairment 2   3 Gait with Horizontal Head Turns Normal 3   4 Gait with Vertical Head Turns Normal 3   5 Gait with Pivot Turn mild impairment 2  6 Step Over Obstacle  moderate impairment 1    7 Gait with Narrow Base of Support severe impairment 0  8 Gait with Eyes Closed moderate impairment 1    9 Ambulating Backwards moderate impairment 1    10 Steps moderate impairment 1    Total: 17/30   * Score of  <=22/30 indicates that patient is at increased risk for falls.   5xSTS: 19.08sec w UE support   GAIT: Distance walked: within clinic Assistive device utilized: None Level of assistance: Complete Independence Comments: mildly widened BOS, reduced truncal rotation and arm swing BIL   [facility] Adult PT Treatment:                                                DATE: 06/17/2024 Neuromuscular re-ed: Seated on airex: Hip add ball squeeze + TA activation Seated LAQ + ball squeeze (alt) Heel raises + 4 ball b/w heels x 20 Resisted standing hip abd + red Tb crossed at ankles 2x10 Resisted marching + red TB around forefoot 2x10 Therapeutic Activity: 8 step up/down, alternating feet --> fwd & lateral stepping  Side stepping over yoga eggs + 2#AW     [facility] Adult PT Treatment:                                                DATE: 06/15/2024 Neuromuscular re-ed: Toe taps on colored dots (clock set up) --> same side & crossing midline Seated straight leg raise (Lt) --> foot propped on block Seated LAQ + ball squeeze (bil) Seated TA activation Therapeutic Activity: Standing alternating heel/toe raises x15 Squats + hip hinge mechanics Sit to stand with no UE support --> working on anterior weight shifting Sit to stand + 1 hand push off from table --> bothered low back Forward rocking + arm reach over chair target Calf stretch variations at railing    [facility] Adult PT Treatment:                                                DATE: 06/08/24 Neuromuscular re-ed: Cone tap x8 BIL Cone double tap x8 BIL  Reactive cone taps SLS x30sec BIL UE support  SLS orange ball lateral dribble x8 UE support 2 laps gait + vertical ball toss (dual tasking) CGA, second task w/ counting up by 5s for cognitive load  Fwd walking w/ ball toss and catch varying angle 2 laps (reactive)  CGA  Tandem walk at counter 1 lap UE support PRN SBA  Therapeutic Activity: STS from raised mat (~24 inches) 2x8 no UE support;  second round with arms outstretched to encourage fwd weight shift  Education/discussion re: safe transfer mechanics and appropriate compensations    PATIENT EDUCATION:  Education details: rationale for interventions, HEP  Person educated: Patient Education method: Explanation, Demonstration, Tactile cues, Verbal cues Education comprehension: verbalized understanding, returned demonstration, verbal cues required, tactile cues required, and needs further education     HOME EXERCISE PROGRAM: Access Code: [email or web address] URL: [email or web address] Date: 06/15/2024 Prepared by: Lamarr Price  Exercises - Sit to Stand with Armchair  - 2-3 x daily - 1 sets -  5-8 reps - Seated Hip Abduction with Resistance  - 1 x daily - 7 x weekly - 1-3 sets - 10 reps - 5 sec hold - Seated Straight Leg Raise   - 1 x daily - 7 x weekly - 1-3 sets - 10 reps - 3-5 sec hold - Alternating Step Taps with Counter Support  - 1 x daily - 7 x weekly - 1-3 sets - 8 reps - Tandem Walking with Counter Support  - 1 x daily - 7 x weekly - 1-3 sets - 5-10 reps - Seated Long Arc Quad with Hip Adduction  - 1 x daily - 7 x weekly - 3 sets - 10 reps - Seated March with Resistance  - 1 x daily - 7 x weekly - 3 sets - 10 reps - Soleus Stretch on Wall  - 1 x daily - 7 x weekly - 3 sets - 10 reps  ASSESSMENT:  CLINICAL IMPRESSION:  Continued hip and functional LE strengthening; occasional cueing provided to decrease forward trunk flexion compensation with looking down. Weakness noted on Lt LE during stepping activity; incorporated multidirectional stepping with light ankle weights for functional strengthening and safe foot clearance.    EVAL: Patient is a pleasant 68 y.o. gentleman who was seen today for physical therapy evaluation and treatment for gait instability ongoing over past two years, worsening over past six months. No overt red flags in discussion/screening today. On exam he demonstrates global weakness of BIL LE  L>R. Sensation and coordination appear intact w/ basic neuro screen. 5xSTS is indicative of fall risk and requires UE support. FGA 17/30 which is indicative of fall risk, most difficulty noted with turns, altered BOS, and vision affected portions. He also demonstrates compensations throughout with noted reduction in velocity when postural stability challenged. Tolerates exam/HEP well, education as above on safe HEP performance and introductory fall risk reduction. No adverse events. Recommend trial of skilled PT to address aforementioned deficits with aim of improving functional tolerance and reducing pain with typical activities. Pt departs today's session in no acute distress, all voiced concerns/questions addressed appropriately from PT perspective.      OBJECTIVE IMPAIRMENTS: Abnormal gait, decreased activity tolerance, decreased balance, decreased endurance, decreased mobility, difficulty walking, decreased strength, and improper body mechanics.   ACTIVITY LIMITATIONS: carrying, lifting, bending, stairs, transfers, bed mobility, and locomotion level  PARTICIPATION LIMITATIONS: cleaning, laundry, community activity, and occupation  PERSONAL FACTORS: Age, Time since onset of injury/illness/exacerbation, and 3+ comorbidities: anxiety/depression, basal cell carcinoma, chronic back pain w/ spinal cord stimulator, CAD s/p DES, DM2, fibromyalgia, HTN, hep B, hx SBO, hx lumbar fusion, sleep apnea, hx urinary incontinence are also affecting patient's functional outcome.   REHAB POTENTIAL: Fair given chronicity and comorbidities  CLINICAL DECISION MAKING: Evolving/moderate complexity  EVALUATION COMPLEXITY: Moderate   GOALS:   SHORT TERM GOALS: Target date: 06/24/2024  Pt will demonstrate appropriate understanding and performance of initially prescribed HEP in order to facilitate improved independence with management of symptoms.  Baseline: HEP established  Goal status: INITIAL   2. Pt will  improve to at least 15sec or less on 5xSTS in order to indicate reduced fall risk.  Baseline: 19sec w UE support  Goal status: INITIAL   LONG TERM GOALS: Target date: 07/22/2024   Pt will score at least 70% on ABC Scale in order to demonstrate reduced fear of falling. MCID (7-23%) Baseline: 8.7/16, 54%  Goal status: INITIAL  2.  Pt will demonstrate LE MMT of at least 4+/5 in  tested groups in order to facilitate improved functional strength.  Baseline: see MMT chart above  Goal status: INITIAL   3.  Pt will score greater than or equal to 22/30 on Functional Gait assessment in order to indicate reduced fall risk (cutoff score </= 22/30 predictive of falls per Willye et al 2010, MCID 4 pts Beninato et al 2014)  Baseline: 17/30  Goal status: INITIAL   4. Pt will perform 5xSTS in </=12 sec w/ or w/o UE support in order to demonstrate reduced fall risk and improved functional independence. (MCID of 2.3sec)  Baseline: 19sec w/ UE support  Goal status: INITIAL   5. Pt will demonstrate appropriate performance of final prescribed HEP in order to facilitate improved self-management of symptoms post-discharge.   Baseline: initial HEP prescribed  Goal status: INITIAL     PLAN:  PT FREQUENCY: 1-2x/week  PT DURATION: 8 weeks  PLANNED INTERVENTIONS: 97164- PT Re-evaluation, 97750- Physical Performance Testing, 97110-Therapeutic exercises, 97530- Therapeutic activity, [id number]- Neuromuscular re-education, 97535- Self Care, [id number]- Manual therapy, [phone]- Gait training, Patient/Family education, Balance training, Stair training, Taping, Vestibular training, Cryotherapy, and Moist heat.  PLAN FOR NEXT SESSION: Walking + perturbations & quick stops/change directions (simulate walking dogs). Review/update HEP PRN. Work on balance/strength exercises as appropriate with emphasis on altered BOS (static and dynamic), generalized LE strength, dual tasking. May be helpful to look at righting reactions and  reactive postural stability. Mindful of spinal cord stimulator, cardiac hx, implanted sleep apnea device, fusion   Lamarr Price, PTA 06/17/2024 11:02 AM

## 2024-06-21 ENCOUNTER — Encounter: Payer: Self-pay | Admitting: Physical Therapy

## 2024-06-21 ENCOUNTER — Ambulatory Visit: Payer: Medicare (Managed Care) | Admitting: Physical Therapy

## 2024-06-21 DIAGNOSIS — R2681 Unsteadiness on feet: Secondary | ICD-10-CM

## 2024-06-21 DIAGNOSIS — M6281 Muscle weakness (generalized): Secondary | ICD-10-CM

## 2024-06-21 NOTE — Therapy (Addendum)
 OUTPATIENT PHYSICAL THERAPY TREATMENT + NO VISIT DISCHARGE SUMMARY (see below)    Patient Name: Brandon [name] MRN: [id number] DOB:Feb 26, 1956, 68 y.o., male Today's Date: 06/21/2024  END OF SESSION:  PT End of Session - 06/21/24 0933     Visit Number 7    Number of Visits 17    Date for Recertification  07/22/24    Authorization Type cign[employer] medicare    Progress Note Due on Visit 10    PT Start Time 0933    PT Stop Time 1015    PT Time Calculation (min) 42 min          Past Medical History:  Diagnosis Date   Anxiety    Arthritis    Basal cell carcinoma    Basal cell carcinoma (BCC) in situ of skin    left neck   BCC (basal cell carcinoma of skin) 01/14/2024   On vertex scalp treated with Mohs 12/31/23    Chronic back pain    has spinal cord stimulator and wears buprenorphine  patch   Colon polyps    Coronary artery disease    S/p DES to mRCA in 12/21 // S/p DES to LCx x 2 in 12/2020 // Diff dz in small to mod Dx (not a good target for PCI); OM1 100 CTO; severe dz in small OM2 and PDA >> Med Rx   Depression    Diabetes mellitus without complication (HCC)    type 2   Fibromyalgia    Gallstones    GERD (gastroesophageal reflux disease)    Hepatitis B    HLD (hyperlipidemia)    Hypertension    Osteoarthritis    Pneumonia    PONV (postoperative nausea and vomiting)    SBO (small bowel obstruction) (HCC)    Sleep apnea    does not use CPAP   Past Surgical History:  Procedure Laterality Date   CARDIAC CATHETERIZATION  01/17/2021   CATARACT EXTRACTION W/ INTRAOCULAR LENS  IMPLANT, BILATERAL     CHOLECYSTECTOMY     CHOLECYSTECTOMY, LAPAROSCOPIC     COLONOSCOPY WITH PROPOFOL  N/A 12/19/2022   Procedure: COLONOSCOPY WITH PROPOFOL ;  Surgeon: Stacia Glendia BRAVO, MD;  Location: [facility] ENDOSCOPY;  Service: Gastroenterology;  Laterality: N/A;   CORONARY STENT INTERVENTION N/A 08/15/2020   Procedure: CORONARY STENT INTERVENTION;  Surgeon: Dann Candyce RAMAN, MD;  Location: [facility]  INVASIVE CV LAB;  Service: Cardiovascular;  Laterality: N/A;   CORONARY STENT INTERVENTION N/A 01/17/2021   Procedure: CORONARY STENT INTERVENTION;  Surgeon: Verlin Lonni BIRCH, MD;  Location: [facility] INVASIVE CV LAB;  Service: Cardiovascular;  Laterality: N/A;   CORONARY ULTRASOUND/IVUS N/A 08/15/2020   Procedure: Intravascular Ultrasound/IVUS;  Surgeon: Dann Candyce RAMAN, MD;  Location: [facility] INVASIVE CV LAB;  Service: Cardiovascular;  Laterality: N/A;   DRUG INDUCED ENDOSCOPY N/A 05/23/2022   Procedure: DRUG INDUCED SLEEP ENDOSCOPY;  Surgeon: Mable Lenis, MD;  Location: [facility] OR;  Service: ENT;  Laterality: N/A;   HERNIA REPAIR     IMPLANTATION OF HYPOGLOSSAL NERVE STIMULATOR Right 07/30/2022   Procedure: IMPLANTATION OF HYPOGLOSSAL NERVE STIMULATOR;  Surgeon: Mable Lenis, MD;  Location: [facility];  Service: ENT;  Laterality: Right;   LEFT HEART CATH AND CORONARY ANGIOGRAPHY N/A 08/15/2020   Procedure: LEFT HEART CATH AND CORONARY ANGIOGRAPHY;  Surgeon: Dann Candyce RAMAN, MD;  Location: [facility] INVASIVE CV LAB;  Service: Cardiovascular;  Laterality: N/A;   LEFT HEART CATH AND CORONARY ANGIOGRAPHY N/A 01/17/2021   Procedure: LEFT HEART CATH AND CORONARY ANGIOGRAPHY;  Surgeon: Verlin Lonni BIRCH, MD;  Location: [facility] INVASIVE CV LAB;  Service: Cardiovascular;  Laterality: N/A;   LUMBAR FUSION     L3-5 with rods   MICRODISCECTOMY LUMBAR  2006   POLYPECTOMY  12/19/2022   Procedure: POLYPECTOMY;  Surgeon: Stacia Glendia BRAVO, MD;  Location: [facility] ENDOSCOPY;  Service: Gastroenterology;;   SPINAL CORD STIMULATOR INSERTION     blood clot removed from spinal cord   SPINE SURGERY     blood clot removed from spinal cord   XI ROBOTIC ASSISTED INGUINAL HERNIA REPAIR WITH MESH     x 3 surgeries   Patient Active Problem List   Diagnosis Date Noted   Urinary frequency 05/10/2024   Urinary incontinence 02/29/2024   Encounter for HIV pre-exposure prophylaxis 02/29/2024   BCC (basal  cell carcinoma of skin) 01/14/2024   Cellulitis of left lower extremity 12/16/2023   Injury of face 10/30/2023   Fall 09/17/2023   Right wrist pain 09/17/2023   Acute pain of right knee 09/17/2023   Right facial pain 09/17/2023   Laceration of right eyebrow 09/17/2023   Pre-ulcerative corn or callous 07/29/2023   Morbid obesity (HCC) 07/29/2023   Chronic back pain 06/08/2023   BPH (benign prostatic hyperplasia) 06/08/2023   GERD (gastroesophageal reflux disease) 06/08/2023   History of colonic polyps 12/19/2022   Family history of colorectal cancer 12/19/2022   Nocturnal oxygen  desaturation 11/06/2022   Neuroforaminal stenosis of cervical spine 05/02/2022   Well adult exam 03/26/2022   AMS (altered mental status) 11/01/2021   Community acquired pneumonia 10/31/2021   Coughing 10/29/2021   Pain management contract signed 10/11/2021   History of lumbar fusion (L4/5) 10/02/2021   Chronic pain syndrome 06/01/2021   Obstructive sleep apnea 05/28/2021   Metallic taste 05/21/2021   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   Acute pain of left shoulder 04/03/2021   Unstable angina (HCC)    Insomnia 09/03/2020   Status post coronary artery stent placement    Mixed hyperlipidemia    Coronary artery disease    Chest pain 08/11/2020   Lumbar facet arthropathy 06/22/2020   Major depression in partial remission 06/04/2020   Hyperlipidemia associated with type 2 diabetes mellitus (HCC) 04/14/2020   Primary hypertension 04/07/2020   Diabetes mellitus type 2, uncomplicated, on long term insulin  pump (HCC) 04/07/2020   Spinal cord stimulator status 05/19/2017   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   Erectile dysfunction 10/09/2015    PCP: Alvia Bring, DO  REFERRING PROVIDER: Alvia Bring, DO  REFERRING DIAG: R26.81 (ICD-10-CM) - Gait instability  Rationale for Evaluation and Treatment: Rehabilitation  THERAPY DIAG:  Unsteadiness on feet  Muscle weakness (generalized)  ONSET DATE: last  couple years  SUBJECTIVE:  SUBJECTIVE STATEMENT: 06/21/2024: states his back has felt very fatigued and irritated, mostly yesterday. Unsure if d/t PT activities or work activities. Felt generally fatigued after last PT session, some muscle spasms. 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  Does feel like balance is improving, not having near-falls quite as often.     EVAL: Pt endorses ongoing issues w/ balance over last couple of years without clear precipitating factor. He endorses worsening over past six months with 1-2 near falls every day, but no recent falls. He states his legs generally feel weak, particularly in the mornings and after prolonged positioning, with tendency to have trouble picking up his feet. He also reports difficulty w/ bed and chair transfers, feels like sometimes he will just keep going and has difficulty righting himself. He endorses difficulty with turns, unlevel ground. Has to be careful when carrying items. Also has a dog and notes that between dog toys and the dog moving around he will sometimes nearly trip. He does report a history of L sided LE nerve issues for which he used to use a cane.  He denies any dizziness, visual changes. He denies LE N/T, although he does note sometimes he will get LUE numbness when he turns his head. He denies bowel/bladder symptoms, fevers/chills, or recent unexplained weight loss. Does report some variable BP (both high and low) but states he is managing this with PCP.    PERTINENT HISTORY:   anxiety/depression, basal cell carcinoma, chronic back pain w/ spinal cord stimulator, CAD s/p DES, DM2, fibromyalgia, HTN, hep B, hx SBO, hx lumbar fusion, sleep apnea, hx urinary incontinence  PAIN:  Denies any significant issues w/ pain  PRECAUTIONS:  cardiac hx, spinal fusion and spinal cord stimulator.  implanted sleep apnea device (per pt report)    RED FLAGS: None   WEIGHT BEARING RESTRICTIONS: No  FALLS:  Has patient fallen in last 6 months? No - has had near falls frequently (1-2 a day) recently  LIVING ENVIRONMENT: Apartment, has dog, no STE Grab bar in shower, no other equipment  OCCUPATION: starting a new job at [employer] soon. retired respiratory therapist for pediatric critical care  PLOF: Independent  PATIENT GOALS: better strength in legs, ways to maintain balance   NEXT MD VISIT: 2 weeks   OBJECTIVE:  Note: Objective measures were completed at Evaluation unless otherwise noted.  DIAGNOSTIC FINDINGS:  No recent imaging in chart  PATIENT SURVEYS:  ABC: 8.7/16, 54%   COGNITION: Overall cognitive status: Within functional limits for tasks assessed     SENSATION/NEURO: Light touch intact BIL LE Negative hoffmann and tromner sign BIL No ataxia with gait FMC intact BIL UE Heel<>shin affected by weakness, no ataxia noted, symmetrical BIL   STRENGTH TESTING:  MMT Right eval Left eval  Shoulder flexion    Shoulder abduction    Elbow flexion    Elbow extension    Grip strength (gross)    Hip flexion 4- 4-  Hip abduction (modified sitting) 4+ 4+  Knee flexion 4 4-  Knee extension 4 4-  Ankle dorsiflexion 4 4  Ankle plantarflexion     (Blank rows = not tested) (Key: WFL = within functional limits not formally assessed, * = concordant pain, s = stiffness/stretching sensation, NT = not tested)  Comments:     FUNCTIONAL TESTS:   FUNCTIONAL GAIT ASSESSMENT:  ITEM EVAL  1 Gait Level Surface Normal 3   2 Change in Gait Speed mild impairment 2   3 Gait with Horizontal  Head Turns Normal 3   4 Gait with Vertical Head Turns Normal 3   5 Gait with Pivot Turn mild impairment 2  6 Step Over Obstacle moderate impairment 1    7 Gait with Narrow Base of Support severe impairment 0  8 Gait with Eyes  Closed moderate impairment 1    9 Ambulating Backwards moderate impairment 1    10 Steps moderate impairment 1    Total: 17/30   * Score of <=22/30 indicates that patient is at increased risk for falls.   5xSTS: 19.08sec w UE support   GAIT: Distance walked: within clinic Assistive device utilized: None Level of assistance: Complete Independence Comments: mildly widened BOS, reduced truncal rotation and arm swing BIL   [facility] Adult PT Treatment:                                                DATE: 06/21/24  Neuromuscular re-ed: SLS + slider circle x10 BIL  Airex runner step up x10 BIL cues for posture  Tandem stance + BIL head turns 2x5 BIL cues for pacing, posture  Therapeutic Activity: 6 inch step up + fwd tap w/ UE support 2x10 BIL  Lateral 6 inch step up + tap x10 BIL  Standing march + 10 sec iso hold x6 Eccentric STS (BIL UE support on ascent) x4 (discontinued d/t transient LBP and difficulty)   Self Care: Education/discussion re: work activities, load management, fatigue management, balance    [facility] Adult PT Treatment:                                                DATE: 06/17/2024 Neuromuscular re-ed: Seated on airex: Hip add ball squeeze + TA activation Seated LAQ + ball squeeze (alt) Heel raises + 4 ball b/w heels x 20 Resisted standing hip abd + red Tb crossed at ankles 2x10 Resisted marching + red TB around forefoot 2x10 Therapeutic Activity: 8 step up/down, alternating feet --> fwd & lateral stepping  Side stepping over yoga eggs + 2#AW     [facility] Adult PT Treatment:                                                DATE: 06/15/2024 Neuromuscular re-ed: Toe taps on colored dots (clock set up) --> same side & crossing midline Seated straight leg raise (Lt) --> foot propped on block Seated LAQ + ball squeeze (bil) Seated TA activation Therapeutic Activity: Standing alternating heel/toe raises x15 Squats + hip hinge mechanics Sit to stand with no UE support  --> working on anterior weight shifting Sit to stand + 1 hand push off from table --> bothered low back Forward rocking + arm reach over chair target Calf stretch variations at railing     PATIENT EDUCATION:  Education details: rationale for interventions, HEP  Person educated: Patient Education method: Programmer, Multimedia, Demonstration, Actor cues, Verbal cues Education comprehension: verbalized understanding, returned demonstration, verbal cues required, tactile cues required, and needs further education     HOME EXERCISE PROGRAM: Access Code: [email or web address] URL: [email or web address] Date: 06/21/2024 Prepared by: Alm Jenny  Exercises - Sit to Stand with Armchair  - 2-3 x daily - 1 sets - 5-8 reps - Seated Hip Abduction with Resistance  - 1 x daily - 7 x weekly - 1-3 sets - 10 reps - 5 sec hold - Seated Straight Leg Raise   - 1 x daily - 7 x weekly - 1-3 sets - 10 reps - 3-5 sec hold - Seated Long Arc Quad with Hip Adduction  - 1 x daily - 7 x weekly - 2 sets - 8 reps - Seated March with Resistance  - 1 x daily - 7 x weekly - 2 sets - 8 reps - Soleus Stretch on Wall  - 1 x daily - 7 x weekly - 2 sets - 8 reps - Tandem Walking with Counter Support  - 1 x daily - 7 x weekly - 1-3 sets - 5-10 reps - Alternating Step Taps with Counter Support  - 1 x daily - 7 x weekly - 1-3 sets - 8 reps - Tandem Stance with Head Rotation  - 1 x daily - 7 x weekly - 2 sets - 5 reps  ASSESSMENT:  CLINICAL IMPRESSION:  06/21/2024: Pt arrives w/ report of improving balance but report of muscular fatigue, particularly in low back, likely attributable in part to increasing activity levels over past couple of weeks. HEP is updated w/ education on modification PRN based on fatigue levels. In clinic we continue working on functional strengthening, core activation, and postural stability. No adverse events, tolerates session well overall without increase in resting pain. Recommend continuing along  current POC in order to address relevant deficits and improve functional tolerance. Pt departs today's session in no acute distress, all voiced questions/concerns addressed appropriately from PT perspective.     EVAL: Patient is a pleasant 68 y.o. gentleman who was seen today for physical therapy evaluation and treatment for gait instability ongoing over past two years, worsening over past six months. No overt red flags in discussion/screening today. On exam he demonstrates global weakness of BIL LE L>R. Sensation and coordination appear intact w/ basic neuro screen. 5xSTS is indicative of fall risk and requires UE support. FGA 17/30 which is indicative of fall risk, most difficulty noted with turns, altered BOS, and vision affected portions. He also demonstrates compensations throughout with noted reduction in velocity when postural stability challenged. Tolerates exam/HEP well, education as above on safe HEP performance and introductory fall risk reduction. No adverse events. Recommend trial of skilled PT to address aforementioned deficits with aim of improving functional tolerance and reducing pain with typical activities. Pt departs today's session in no acute distress, all voiced concerns/questions addressed appropriately from PT perspective.      OBJECTIVE IMPAIRMENTS: Abnormal gait, decreased activity tolerance, decreased balance, decreased endurance, decreased mobility, difficulty walking, decreased strength, and improper body mechanics.   ACTIVITY LIMITATIONS: carrying, lifting, bending, stairs, transfers, bed mobility, and locomotion level  PARTICIPATION LIMITATIONS: cleaning, laundry, community activity, and occupation  PERSONAL FACTORS: Age, Time since onset of injury/illness/exacerbation, and 3+ comorbidities: anxiety/depression, basal cell carcinoma, chronic back pain w/ spinal cord stimulator, CAD s/p DES, DM2, fibromyalgia, HTN, hep B, hx SBO, hx lumbar fusion, sleep apnea, hx urinary  incontinence are also affecting patient's functional outcome.   REHAB POTENTIAL: Fair given chronicity and comorbidities  CLINICAL DECISION MAKING: Evolving/moderate complexity  EVALUATION COMPLEXITY: Moderate   GOALS:   SHORT TERM GOALS: Target date: 06/24/2024  Pt will demonstrate appropriate understanding and performance of initially prescribed HEP in order  to facilitate improved independence with management of symptoms.  Baseline: HEP established  Goal status: INITIAL   2. Pt will improve to at least 15sec or less on 5xSTS in order to indicate reduced fall risk.  Baseline: 19sec w UE support  Goal status: INITIAL   LONG TERM GOALS: Target date: 07/22/2024   Pt will score at least 70% on ABC Scale in order to demonstrate reduced fear of falling. MCID (7-23%) Baseline: 8.7/16, 54%  Goal status: INITIAL  2.  Pt will demonstrate LE MMT of at least 4+/5 in tested groups in order to facilitate improved functional strength.  Baseline: see MMT chart above  Goal status: INITIAL   3.  Pt will score greater than or equal to 22/30 on Functional Gait assessment in order to indicate reduced fall risk (cutoff score </= 22/30 predictive of falls per Willye et al 2010, MCID 4 pts Beninato et al 2014)  Baseline: 17/30  Goal status: INITIAL   4. Pt will perform 5xSTS in </=12 sec w/ or w/o UE support in order to demonstrate reduced fall risk and improved functional independence. (MCID of 2.3sec)  Baseline: 19sec w/ UE support  Goal status: INITIAL   5. Pt will demonstrate appropriate performance of final prescribed HEP in order to facilitate improved self-management of symptoms post-discharge.   Baseline: initial HEP prescribed  Goal status: INITIAL     PLAN:  PT FREQUENCY: 1-2x/week  PT DURATION: 8 weeks  PLANNED INTERVENTIONS: [id number]- PT Re-evaluation, 97750- Physical Performance Testing, 97110-Therapeutic exercises, 97530- Therapeutic activity, [id number]- Neuromuscular re-education,  97535- Self Care, [id number]- Manual therapy, [phone]- Gait training, Patient/Family education, Balance training, Stair training, Taping, Vestibular training, Cryotherapy, and Moist heat.  PLAN FOR NEXT SESSION: Walking + perturbations & quick stops/change directions (simulate walking dogs). Review/update HEP PRN. Work on balance/strength exercises as appropriate with emphasis on altered BOS (static and dynamic), generalized LE strength, dual tasking. May be helpful to look at righting reactions and reactive postural stability. Mindful of spinal cord stimulator, cardiac hx, implanted sleep apnea device, fusion   Alm DELENA Jenny PT, DPT 06/21/2024 10:18 AM     Discharge addendum 08/04/2024  PHYSICAL THERAPY DISCHARGE SUMMARY  Visits from Start of Care: 7  Current functional level related to goals / functional outcomes: Unable to be assessed   Remaining deficits: Unable to be assessed   Education / Equipment: Unable to be assessed  Patient goals were unable to be assessed. Patient is being discharged due to not returning since the last visit.  Alm DELENA Jenny PT, DPT 08/04/2024 9:53 AM

## 2024-06-24 ENCOUNTER — Ambulatory Visit: Payer: Medicare (Managed Care)

## 2024-07-15 ENCOUNTER — Other Ambulatory Visit (HOSPITAL_BASED_OUTPATIENT_CLINIC_OR_DEPARTMENT_OTHER): Payer: Self-pay

## 2024-07-15 ENCOUNTER — Ambulatory Visit: Payer: Self-pay

## 2024-07-15 NOTE — Telephone Encounter (Signed)
 FYI Only or Action Required?: FYI only for provider: urgent care disposition.  Patient was last seen in primary care on 05/27/2024 by Alvia Bring, DO.  Called Nurse Triage reporting Elbow Injury.  Symptoms began a week ago.  Interventions attempted: OTC medications: Tylenol  and Other: warm compress.  Symptoms are: right elbow severe pain with swelling to entire joint (pain radiates down to right hand and up to right shoulder), right hand weakness with holding and grasping objects gradually worsening.  Triage Disposition: See HCP Within 4 Hours (Or PCP Triage)  Patient/caregiver understands and will follow disposition?: Yes             Copied from CRM [id number]. Topic: Clinical - Red Word Triage >> Jul 15, 2024 10:32 AM Ivette P wrote: [facility] that prompted transfer to Nurse Triage: pt believes he might  have a splinter facture - hit on doorknob Reason for Disposition  [1] SEVERE pain (e.g., excruciating) AND [2] not improved 2 hours after pain medicine/ice packs  Answer Assessment - Initial Assessment Questions 1. MECHANISM: How did the injury happen?     Hit right elbow on doorknob.  2. ONSET: When did the injury happen? (e.g., minutes, hours ago)      1 week ago.  3. LOCATION: What part of the elbow is injured?      Right elbow, entire elbow and pain radiates up to shoulder and down right arm.  4. APPEARANCE of INJURY: What does the injury look like?      No obvious deformity. Some swelling noted.   5. SEVERITY: Can you use the elbow normally?  Can you bend it and straighten it fully?     Painful, but is able to straighten and bend the right elbow. Right hand weakness noted, having difficulty holding and grasping objects in right hand.  6. SIZE: For cuts, bruises, or swelling, ask: How large is it? (e.g., inches or centimeters; entire joint)      Swelling, he states slight.  7. PAIN: Is there pain? If Yes, ask: How bad is the pain?  (Scale  0-10; or none, mild, moderate, severe)     Yes. 8/10.Treating with Tylenol  and warm compress.  8. TETANUS: For any breaks in the skin, ask: When was your last tetanus booster?     N/A.  9. OTHER SYMPTOMS: Do you have any other symptoms?  (e.g., numbness in hand)     Denies numbness or tingling, discoloration, blue or cold right arm/hand.  Protocols used: Elbow Injury-A-AH

## 2024-07-17 ENCOUNTER — Other Ambulatory Visit: Payer: Self-pay

## 2024-07-17 ENCOUNTER — Emergency Department (HOSPITAL_BASED_OUTPATIENT_CLINIC_OR_DEPARTMENT_OTHER): Payer: Medicare (Managed Care)

## 2024-07-17 ENCOUNTER — Emergency Department (HOSPITAL_BASED_OUTPATIENT_CLINIC_OR_DEPARTMENT_OTHER)
Admission: EM | Admit: 2024-07-17 | Discharge: 2024-07-17 | Disposition: A | Discharge status: Home / Self Care | Payer: Medicare (Managed Care) | Attending: Emergency Medicine | Admitting: Emergency Medicine

## 2024-07-17 ENCOUNTER — Encounter (HOSPITAL_BASED_OUTPATIENT_CLINIC_OR_DEPARTMENT_OTHER): Payer: Self-pay

## 2024-07-17 DIAGNOSIS — W228XXA Striking against or struck by other objects, initial encounter: Secondary | ICD-10-CM | POA: Diagnosis not present

## 2024-07-17 DIAGNOSIS — M25521 Pain in right elbow: Secondary | ICD-10-CM | POA: Insufficient documentation

## 2024-07-17 DIAGNOSIS — Z91040 Latex allergy status: Secondary | ICD-10-CM | POA: Insufficient documentation

## 2024-07-17 DIAGNOSIS — M25421 Effusion, right elbow: Secondary | ICD-10-CM | POA: Diagnosis not present

## 2024-07-17 MED ORDER — NAPROXEN 375 MG PO TABS
375.0000 mg | ORAL_TABLET | Freq: Two times a day (BID) | ORAL | 0 refills | Status: AC
Start: 2024-07-17 — End: ?

## 2024-07-17 MED ORDER — NAPROXEN 250 MG PO TABS
375.0000 mg | ORAL_TABLET | Freq: Once | ORAL | Status: AC
  Administered 2024-07-17: 375 mg via ORAL
  Filled 2024-07-17: qty 2

## 2024-07-17 NOTE — Discharge Instructions (Addendum)
 You have been seen and discharged from the emergency department.  The x-ray of your elbow was negative for any fracture.  I believe you may be suffering from tendinitis.  You may use the Ace wrap for support of compression.  Take anti-inflammatory pain medicine as directed.  Establish care and follow-up with orthopedics for further recommendations and treatment.  Follow-up with your primary provider for further evaluation and further care. Take home medications as prescribed. If you have any worsening symptoms or further concerns for your health please return to an emergency department for further evaluation.

## 2024-07-17 NOTE — ED Triage Notes (Signed)
 PT states he bumped his right elbow on a doorknob about a week ago. C.o pain and has difficulty bending it since the bump.  Has difficulty grasping things and states that when he opens/closes his hand a sharp pain shoots up to his shoulder.

## 2024-07-17 NOTE — ED Provider Notes (Signed)
 [place] [facility] DEPARTMENT AT [facility] Provider Note   CSN: [id number] Arrival date & time: 07/17/24  9287     Patient presents with: Elbow Pain   Brandon [name] is a 68 y.o. male.   HPI   68 year old male presents emergency department right elbow pain.  A week ago he hit the right elbow on a doorknob.  Since then he has been having pain and difficulty with bending and gripping things in his right hand.  Pain is mainly located to the right elbow sometimes radiates to the right wrist.  There is been no hand discoloration.  Prior to Admission medications   Medication Sig Start Date End Date Taking? Authorizing Provider  acetaminophen  (TYLENOL ) 500 MG tablet Take 500-1,000 mg by mouth every 6 (six) hours as needed (for pain or headaches).    [provider]  albuterol  (VENTOLIN  HFA) 108 (90 Base) MCG/ACT inhaler Inhale 2 puffs into the lungs every 6 (six) hours as needed for wheezing or shortness of breath (OR WHEN SICK). 10/29/21   [provider]  AMBULATORY NON FORMULARY MEDICATION Please provide home oxygen  for nocturnal use.  Apply 2-4L of O2 via nasal cannula at bedtime.  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 11/13/22   Alvia Bring, DO  ammonium lactate  (AMLACTIN) 12 % cream Apply to the affected area(s) topically as needed for dry skin. 11/07/23   Sikora, Rebecca, DPM  ARIPiprazole  (ABILIFY ) 10 MG tablet Take 1 tablet (10 mg total) by mouth every morning. 01/26/24   Alvia Bring, DO  b complex vitamins capsule Take 1 capsule by mouth in the morning.    [provider]  buPROPion  (WELLBUTRIN  XL) 150 MG 24 hr tablet Take 1 tablet (150 mg total) by mouth daily in addition to 300mg  tablet. 01/12/24   Alvia Bring, DO  buPROPion  (WELLBUTRIN  XL) 300 MG 24 hr tablet Take 1 tablet (300 mg total) by mouth  daily. 01/27/24   Alvia Bring, DO  clopidogrel  (PLAVIX ) 75 MG tablet Take 1 tablet (75 mg total) by mouth daily with breakfast. 12/20/22   Stacia Glendia BRAVO, MD  Continuous Glucose Sensor (DEXCOM G7 SENSOR) MISC Inject 1 Device into the skin See admin instructions. Place 1 new sensor into the skin every 10 days    [provider]  docusate sodium  (COLACE) 100 MG capsule Take 100 mg by mouth 2 (two) times daily as needed for mild constipation.    [provider]  empagliflozin  (JARDIANCE ) 25 MG TABS tablet Take 1 tablet (25 mg total) by mouth daily. 10/27/23   Alvia Bring, DO  emtricitabine -tenofovir  AF (DESCOVY ) 200-25 MG tablet Take 1 tablet by mouth daily. 02/26/24   Alvia Bring, DO  eszopiclone  (LUNESTA ) 2 MG TABS tablet Take 1 tablet (2 mg total) by mouth at bedtime as needed for sleep. Take immediately before bedtime 02/26/24   Alvia Bring, DO  glucose blood (FREESTYLE LITE) test strip USE 1 TO CHECK BLOOD GLUCOSE UP TO 5 TIMES DAILY. 05/03/24 06/09/25  Alvia Bring, DO  GVOKE HYPOPEN  1-PACK 1 MG/0.2ML SOAJ Use as needed for hypoglycemia 05/13/22   Trixie File, MD  insulin  degludec (TRESIBA ) 100 UNIT/ML FlexTouch Pen Inject 25 units into the skin daily. 05/27/24   Alvia Bring, DO  insulin  lispro (HUMALOG ) 100 UNIT/ML injection For use in pump, total of 60 units per day 08/07/23  Trixie File, MD  Iron , Ferrous Sulfate , 325 (65 Fe) MG TABS Take 1 tablet (325 mg) by mouth daily. 05/21/23   Alvia Bring, DO  magic mouthwash (nystatin , lidocaine , diphenhydrAMINE, alum & mag hydroxide) suspension Swish and spit 5 mLs 3 (three) times daily as needed for mouth pain. 03/10/24   Alvia Bring, DO  magnesium  oxide (MAG-OX) 400 MG tablet Take 2 tablets (800 mg total) by mouth daily. 11/11/23   Alvia Bring, DO  metoCLOPramide  (REGLAN ) 10 MG tablet Take 1 tablet (10 mg total) by mouth daily as needed for nausea or vomiting (upset stomach). 11/04/23   Alvia Bring, DO   Multiple Vitamin (MULTI-VITAMIN) tablet Take 1 tablet by mouth daily. 06/03/24   Alvia Bring, DO  mupirocin  ointment (BACTROBAN ) 2 % Apply 1 Application topically 2 (two) times daily. Patient taking differently: Apply 1 Application topically See admin instructions. Apply to affected area of the left ankle 2 times a day 12/14/23   Raspet, Erin K, PA-C  nitroGLYCERIN  (NITROSTAT ) 0.4 MG SL tablet Place 0.4 mg under the tongue every 5 (five) minutes x 3 doses as needed for chest pain.    [provider]  ondansetron  (ZOFRAN -ODT) 4 MG disintegrating tablet Take 1 tablet (4 mg total) by mouth every 8 (eight) hours as needed for nausea or vomiting. Patient taking differently: Take 4 mg by mouth every 8 (eight) hours as needed for nausea or vomiting (dissolve orally). 08/14/23   Alvia Bring, DO  oxyCODONE  (OXY IR/ROXICODONE ) 5 MG immediate release tablet Take 1 tablet (5 mg total) by mouth every 4 (four) hours as needed for severe pain (pain score 7-10). 01/08/24   Paci, Karina M, MD  OXYGEN  Inhale 3 L/min into the lungs at bedtime.    [provider]  pantoprazole  (PROTONIX ) 40 MG tablet Take 1 tablet (40 mg total) by mouth daily. 01/26/24   Alvia Bring, DO  polyethylene glycol powder (GLYCOLAX /MIRALAX ) 17 GM/SCOOP powder Take 17 g by mouth daily as needed for mild constipation (mix as directed).    [provider]  rosuvastatin  (CRESTOR ) 20 MG tablet Take 1 tablet (20 mg total) by mouth daily. 06/01/24   Thukkani, Arun K, MD  solifenacin  (VESICARE ) 5 MG tablet Take 1 tablet (5 mg total) by mouth daily. 02/26/24   Alvia Bring, DO  tamsulosin  (FLOMAX ) 0.4 MG CAPS capsule Take 1 capsule (0.4 mg total) by mouth daily. 11/04/23   Alvia Bring, DO  testosterone  (ANDROGEL ) 50 MG/5GM (1%) GEL Place 5 g (1 packet) onto the shoulders and upper arms daily as directed. 11/24/23 05/13/24  Alvia Bring, DO  tiZANidine  (ZANAFLEX ) 4 MG tablet Take 1-1&1/2 tablets (4-6 mg total) by mouth  every 8 (eight) hours as needed for muscle spasms. 11/06/23   Curtis Debby PARAS, MD    Allergies: Kiwi extract, Other, Atorvastatin , Penicillins, Ancef [cefazolin], Latex, Levaquin [levofloxacin], and Peach [prunus persica]    Review of Systems  Constitutional:  Negative for fever.  Musculoskeletal:        +R elbow pain  Skin:  Negative for color change.  Neurological:  Negative for numbness.    Updated Vital Signs BP (!) 154/50   Pulse 79   Temp 97.6 F (36.4 C)   Resp 16   Ht 5' 8 (1.727 m)   Wt 88.5 kg   SpO2 97%   BMI 29.65 kg/m   Physical Exam Vitals and nursing note reviewed.  Constitutional:      Appearance: Normal appearance.  HENT:  Head: Normocephalic.     Mouth/Throat:     Mouth: Mucous membranes are moist.  Cardiovascular:     Rate and Rhythm: Normal rate.  Pulmonary:     Effort: Pulmonary effort is normal. No respiratory distress.  Musculoskeletal:     Comments: Mild swelling around the right radial head with tenderness to palpation.  No significant bruising.  Equal palpable radial pulses, grip strength on the right hand is weakened secondary to pain  Skin:    General: Skin is warm.  Neurological:     Mental Status: He is alert and oriented to person, place, and time. Mental status is at baseline.  Psychiatric:        Mood and Affect: Mood normal.     (all labs ordered are listed, but only abnormal results are displayed) Labs Reviewed - No data to display  EKG: None  Radiology: No results found.   Procedures   Medications Ordered in the ED  naproxen  (NAPROSYN ) tablet 375 mg (375 mg Oral Given 07/17/24 0745)                                    Medical Decision Making Amount and/or Complexity of Data Reviewed Radiology: ordered.  Risk Prescription drug management.   68 year old male presents emergency department with right elbow pain after hitting it on a doorknob a week ago.  He has some mild swelling around the right  radial head but otherwise the arm is neurovascularly intact.  X-rays are negative for acute fracture.  Pain improved with a dose of naproxen .  Will plan for supportive management and refer outpatient orthopedics.  Patient at this time appears safe and stable for discharge and close outpatient follow up. Discharge plan and strict return to ED precautions discussed, patient verbalizes understanding and agreement.     Final diagnoses:  None    ED Discharge Orders     None          Bari Roxie HERO, DO 07/17/24 9156

## 2024-07-26 ENCOUNTER — Other Ambulatory Visit (HOSPITAL_BASED_OUTPATIENT_CLINIC_OR_DEPARTMENT_OTHER): Payer: Self-pay

## 2024-08-22 ENCOUNTER — Emergency Department (HOSPITAL_COMMUNITY): Payer: Medicare (Managed Care)

## 2024-08-22 ENCOUNTER — Emergency Department (HOSPITAL_COMMUNITY)
Admission: EM | Admit: 2024-08-22 | Discharge: 2024-08-22 | Disposition: A | Discharge status: Home / Self Care | Payer: Medicare (Managed Care) | Attending: Emergency Medicine | Admitting: Emergency Medicine

## 2024-08-22 ENCOUNTER — Other Ambulatory Visit: Payer: Self-pay

## 2024-08-22 DIAGNOSIS — R079 Chest pain, unspecified: Secondary | ICD-10-CM | POA: Diagnosis present

## 2024-08-22 DIAGNOSIS — Z91040 Latex allergy status: Secondary | ICD-10-CM | POA: Diagnosis not present

## 2024-08-22 DIAGNOSIS — R739 Hyperglycemia, unspecified: Secondary | ICD-10-CM | POA: Diagnosis not present

## 2024-08-22 DIAGNOSIS — R072 Precordial pain: Secondary | ICD-10-CM | POA: Diagnosis not present

## 2024-08-22 LAB — BASIC METABOLIC PANEL WITH GFR
Anion gap: 13 (ref 5–15)
BUN: 10 mg/dL (ref 8–23)
CO2: 21 mmol/L — ABNORMAL LOW (ref 22–32)
Calcium: 9.1 mg/dL (ref 8.9–10.3)
Chloride: 96 mmol/L — ABNORMAL LOW (ref 98–111)
Creatinine, Ser: 0.83 mg/dL (ref 0.61–1.24)
GFR, Estimated: >60 mL/min
Glucose, Bld: 517 mg/dL (ref 70–99)
Potassium: 4.9 mmol/L (ref 3.5–5.1)
Sodium: 130 mmol/L — ABNORMAL LOW (ref 135–145)

## 2024-08-22 LAB — CBC
HCT: 45.3 % (ref 39.0–52.0)
Hemoglobin: 15.9 g/dL (ref 13.0–17.0)
MCH: 33.1 pg (ref 26.0–34.0)
MCHC: 35.1 g/dL (ref 30.0–36.0)
MCV: 94.2 fL (ref 80.0–100.0)
Platelets: 211 K/uL (ref 150–400)
RBC: 4.81 MIL/uL (ref 4.22–5.81)
RDW: 11.5 % (ref 11.5–15.5)
WBC: 6.6 K/uL (ref 4.0–10.5)
nRBC: 0 % (ref 0.0–0.2)

## 2024-08-22 LAB — TROPONIN T, HIGH SENSITIVITY
Troponin T High Sensitivity: <15 ng/L (ref 0–19)
Troponin T High Sensitivity: <15 ng/L (ref 0–19)

## 2024-08-22 LAB — CBG MONITORING, ED
Glucose-Capillary: 404 mg/dL — ABNORMAL HIGH (ref 70–99)
Glucose-Capillary: 298 mg/dL — ABNORMAL HIGH (ref 70–99)
Glucose-Capillary: 263 mg/dL — ABNORMAL HIGH (ref 70–99)

## 2024-08-22 LAB — D-DIMER, QUANTITATIVE: D-Dimer, Quant: 0.27 ug{FEU}/mL (ref 0.00–0.50)

## 2024-08-22 MED ORDER — FAMOTIDINE 20 MG PO TABS
20.0000 mg | ORAL_TABLET | Freq: Once | ORAL | Status: AC
  Administered 2024-08-22: 20 mg via ORAL
  Filled 2024-08-22: qty 1

## 2024-08-22 MED ORDER — ALUM & MAG HYDROXIDE-SIMETH 200-200-20 MG/5ML PO SUSP
30.0000 mL | Freq: Once | ORAL | Status: AC
  Administered 2024-08-22: 30 mL via ORAL
  Filled 2024-08-22: qty 30

## 2024-08-22 MED ORDER — INSULIN ASPART 100 UNIT/ML IJ SOLN
14.0000 [IU] | Freq: Once | INTRAMUSCULAR | Status: AC
  Administered 2024-08-22: 10 [IU] via SUBCUTANEOUS
  Filled 2024-08-22: qty 14

## 2024-08-22 MED ORDER — ACETAMINOPHEN 500 MG PO TABS
1000.0000 mg | ORAL_TABLET | Freq: Once | ORAL | Status: AC
  Administered 2024-08-22: 1000 mg via ORAL
  Filled 2024-08-22: qty 2

## 2024-08-22 MED ORDER — LACTATED RINGERS IV BOLUS
1000.0000 mL | Freq: Once | INTRAVENOUS | Status: AC
  Administered 2024-08-22: 1000 mL via INTRAVENOUS

## 2024-08-22 NOTE — ED Triage Notes (Signed)
 Patient arrives via [facility] from home for chest pain. Woke up this am with sudden onset left side chest pain radiating down left arm 6/10 chest pain. Hx of MI. Insulin  dependent diabetic, no insulin  this am. 12 lead unremarkable. HR 96, CBG 533, 95 on room air, BP 126/66  450 NS cc en route 0.4 nitroglycerin   20 R hand

## 2024-08-22 NOTE — ED Provider Triage Note (Signed)
 Emergency Medicine Provider Triage Evaluation Note  Brandon [name] , a 68 y.o. male  was evaluated in triage.  Pt complains of left chest pain at rest onset early this AM.  Review of Systems  Positive: Cp. Negative: Fever.   Physical Exam  BP 126/78 (BP Location: Right Arm)   Pulse 88   Temp 98.6 F (37 C) (Oral)   Resp 16   Ht 1.727 m (5' 8)   Wt 88 kg   SpO2 97%   BMI 29.50 kg/m  Gen:   Awake, no distress   Resp:  Normal effort cta bil CV:  RRR No g/r/m MSK:   Moves extremities without difficulty no calf swelling or tenderness.    Medical Decision Making  Medically screening exam initiated at 12:21 PM.  Appropriate orders placed.  Brandon [name] was informed that the remainder of the evaluation will be completed by another provider, this initial triage assessment does not replace that evaluation, and the importance of remaining in the ED until their evaluation is complete.  Labs and imaging ordered.    Bernard Drivers, MD 08/22/24 [phone]

## 2024-08-22 NOTE — ED Provider Notes (Signed)
 " [place] EMERGENC[facility] DEPARTMENT AT [facility] Provider Note   CSN: [id number] Arrival date & time: 08/22/24  1137     Patient presents with: Chest Pain   Brandon [name] is a 68 y.o. male.   Pt c/o pain to mid/left chest earlier this AM at rest. Constant, dull, non radiating, not pleuritic. No associated sob, nv or diaphoresis. No chest wall injury or strain. +recent stressors. No heartburn. No fever, no prod cough. No leg pain or swelling. No hx dvt or pe. No recent exertional chest pain or discomfort. No unusual fatigue or doe. No fever or chills.  CBG noted to be in 500s. Hx cad, but this seems different. No tearing or ripping pain, no radiation to abd or back or flank.   The history is provided by the patient, medical records and the EMS personnel.  Chest Pain Associated symptoms: no abdominal pain, no back pain, no cough, no fever, no headache, no palpitations, no shortness of breath and no vomiting        Prior to Admission medications  Medication Sig Start Date End Date Taking? Authorizing Provider  acetaminophen  (TYLENOL ) 500 MG tablet Take 500-1,000 mg by mouth every 6 (six) hours as needed (for pain or headaches).    [provider]  albuterol  (VENTOLIN  HFA) 108 (90 Base) MCG/ACT inhaler Inhale 2 puffs into the lungs every 6 (six) hours as needed for wheezing or shortness of breath (OR WHEN SICK). 10/29/21   [provider]  AMBULATORY NON FORMULARY MEDICATION Please provide home oxygen  for nocturnal use.  Apply 2-4L of O2 via nasal cannula at bedtime.  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 11/13/22   Alvia Bring, DO  ammonium lactate  (AMLACTIN) 12 % cream Apply to the affected area(s) topically as needed for dry skin. 11/07/23   Sikora, Rebecca, DPM  ARIPiprazole  (ABILIFY ) 10 MG tablet Take 1 tablet (10 mg  total) by mouth every morning. 01/26/24   Alvia Bring, DO  b complex vitamins capsule Take 1 capsule by mouth in the morning.    [provider]  buPROPion  (WELLBUTRIN  XL) 150 MG 24 hr tablet Take 1 tablet (150 mg total) by mouth daily in addition to 300mg  tablet. 01/12/24   Alvia Bring, DO  buPROPion  (WELLBUTRIN  XL) 300 MG 24 hr tablet Take 1 tablet (300 mg total) by mouth daily. 01/27/24   Alvia Bring, DO  clopidogrel  (PLAVIX ) 75 MG tablet Take 1 tablet (75 mg total) by mouth daily with breakfast. 12/20/22   Stacia Glendia BRAVO, MD  Continuous Glucose Sensor (DEXCOM G7 SENSOR) MISC Inject 1 Device into the skin See admin instructions. Place 1 new sensor into the skin every 10 days    [provider]  docusate sodium  (COLACE) 100 MG capsule Take 100 mg by mouth 2 (two) times daily as needed for mild constipation.    [provider]  empagliflozin  (JARDIANCE ) 25 MG TABS tablet Take 1 tablet (25 mg total) by mouth daily. 10/27/23   Alvia Bring, DO  emtricitabine -tenofovir  AF (DESCOVY ) 200-25 MG tablet Take 1 tablet by mouth daily. 02/26/24   Alvia Bring, DO  eszopiclone  (LUNESTA ) 2 MG TABS tablet Take 1 tablet (2 mg total) by mouth at bedtime as needed for sleep. Take immediately before bedtime 02/26/24   Alvia Bring, DO  glucose blood (FREESTYLE LITE) test strip USE 1 TO  CHECK BLOOD GLUCOSE UP TO 5 TIMES DAILY. 05/03/24 06/09/25  Alvia Bring, DO  GVOKE HYPOPEN  1-PACK 1 MG/0.2ML SOAJ Use as needed for hypoglycemia 05/13/22   Trixie File, MD  insulin  degludec (TRESIBA ) 100 UNIT/ML FlexTouch Pen Inject 25 units into the skin daily. 05/27/24   Alvia Bring, DO  insulin  lispro (HUMALOG ) 100 UNIT/ML injection For use in pump, total of 60 units per day 08/07/23   Trixie File, MD  Iron , Ferrous Sulfate , 325 (65 Fe) MG TABS Take 1 tablet (325 mg) by mouth daily. 05/21/23   Alvia Bring, DO  magic mouthwash (nystatin , lidocaine , diphenhydrAMINE, alum & mag  hydroxide) suspension Swish and spit 5 mLs 3 (three) times daily as needed for mouth pain. 03/10/24   Alvia Bring, DO  magnesium  oxide (MAG-OX) 400 MG tablet Take 2 tablets (800 mg total) by mouth daily. 11/11/23   Alvia Bring, DO  metoCLOPramide  (REGLAN ) 10 MG tablet Take 1 tablet (10 mg total) by mouth daily as needed for nausea or vomiting (upset stomach). 11/04/23   Alvia Bring, DO  Multiple Vitamin (MULTI-VITAMIN) tablet Take 1 tablet by mouth daily. 06/03/24   Alvia Bring, DO  mupirocin  ointment (BACTROBAN ) 2 % Apply 1 Application topically 2 (two) times daily. Patient taking differently: Apply 1 Application topically See admin instructions. Apply to affected area of the left ankle 2 times a day 12/14/23   Raspet, Erin K, PA-C  naproxen  (NAPROSYN ) 375 MG tablet Take 1 tablet (375 mg total) by mouth 2 (two) times daily. 07/17/24   Horton, Roxie HERO, DO  nitroGLYCERIN  (NITROSTAT ) 0.4 MG SL tablet Place 0.4 mg under the tongue every 5 (five) minutes x 3 doses as needed for chest pain.    [provider]  ondansetron  (ZOFRAN -ODT) 4 MG disintegrating tablet Take 1 tablet (4 mg total) by mouth every 8 (eight) hours as needed for nausea or vomiting. Patient taking differently: Take 4 mg by mouth every 8 (eight) hours as needed for nausea or vomiting (dissolve orally). 08/14/23   Alvia Bring, DO  oxyCODONE  (OXY IR/ROXICODONE ) 5 MG immediate release tablet Take 1 tablet (5 mg total) by mouth every 4 (four) hours as needed for severe pain (pain score 7-10). 01/08/24   Paci, Karina M, MD  OXYGEN  Inhale 3 L/min into the lungs at bedtime.    [provider]  pantoprazole  (PROTONIX ) 40 MG tablet Take 1 tablet (40 mg total) by mouth daily. 01/26/24   Alvia Bring, DO  polyethylene glycol powder (GLYCOLAX /MIRALAX ) 17 GM/SCOOP powder Take 17 g by mouth daily as needed for mild constipation (mix as directed).    [provider]  rosuvastatin  (CRESTOR ) 20 MG tablet Take 1 tablet  (20 mg total) by mouth daily. 06/01/24   Thukkani, Arun K, MD  solifenacin  (VESICARE ) 5 MG tablet Take 1 tablet (5 mg total) by mouth daily. 02/26/24   Alvia Bring, DO  tamsulosin  (FLOMAX ) 0.4 MG CAPS capsule Take 1 capsule (0.4 mg total) by mouth daily. 11/04/23   Alvia Bring, DO  testosterone  (ANDROGEL ) 50 MG/5GM (1%) GEL Place 5 g (1 packet) onto the shoulders and upper arms daily as directed. 11/24/23 05/13/24  Alvia Bring, DO  tiZANidine  (ZANAFLEX ) 4 MG tablet Take 1-1&1/2 tablets (4-6 mg total) by mouth every 8 (eight) hours as needed for muscle spasms. 11/06/23   Curtis Debby PARAS, MD    Allergies: Kiwi extract, Other, Atorvastatin , Penicillins, Ancef [cefazolin], Latex, Levaquin [levofloxacin], and Peach [prunus persica]    Review of Systems  Constitutional:  Negative for chills and fever.  HENT:  Negative for sore throat.   Respiratory:  Negative for cough and shortness of breath.   Cardiovascular:  Positive for chest pain. Negative for palpitations and leg swelling.  Gastrointestinal:  Negative for abdominal pain, diarrhea and vomiting.  Endocrine: Negative for polydipsia.  Genitourinary:  Negative for dysuria and flank pain.  Musculoskeletal:  Negative for back pain and neck pain.  Skin:  Negative for rash.  Neurological:  Negative for headaches.    Updated Vital Signs BP 126/78 (BP Location: Right Arm)   Pulse 88   Temp 98.6 F (37 C) (Oral)   Resp 16   Ht 1.727 m (5' 8)   Wt 88 kg   SpO2 97%   BMI 29.50 kg/m   Physical Exam Vitals and nursing note reviewed.  Constitutional:      Appearance: Normal appearance. He is well-developed.  HENT:     Head: Atraumatic.     Nose: Nose normal.     Mouth/Throat:     Mouth: Mucous membranes are moist.  Eyes:     General: No scleral icterus.    Conjunctiva/sclera: Conjunctivae normal.  Neck:     Trachea: No tracheal deviation.  Cardiovascular:     Rate and Rhythm: Normal rate and regular rhythm.     Pulses:  Normal pulses.     Heart sounds: Normal heart sounds. No murmur heard.    No friction rub. No gallop.  Pulmonary:     Effort: Pulmonary effort is normal. No accessory muscle usage or respiratory distress.     Breath sounds: Normal breath sounds.  Chest:     Chest wall: No tenderness.  Abdominal:     General: Bowel sounds are normal. There is no distension.     Palpations: Abdomen is soft. There is no mass.     Tenderness: There is no abdominal tenderness. There is no guarding.  Genitourinary:    Comments: No cva tenderness. Musculoskeletal:        General: No swelling or tenderness.     Cervical back: Normal range of motion and neck supple. No rigidity.     Right lower leg: No edema.     Left lower leg: No edema.  Skin:    General: Skin is warm and dry.     Findings: No rash.  Neurological:     Mental Status: He is alert.     Comments: Alert, speech clear. Motor/sens grossly intact bil.   Psychiatric:        Mood and Affect: Mood normal.     (all labs ordered are listed, but only abnormal results are displayed) Results for orders placed or performed during the hospital encounter of 08/22/24  Basic metabolic panel   Collection Time: 08/22/24 11:50 AM  Result Value Ref Range   Sodium 130 (L) 135 - 145 mmol/L   Potassium 4.9 3.5 - 5.1 mmol/L   Chloride 96 (L) 98 - 111 mmol/L   CO2 21 (L) 22 - 32 mmol/L   Glucose, Bld 517 (HH) 70 - 99 mg/dL   BUN 10 8 - 23 mg/dL   Creatinine, Ser 9.16 0.61 - 1.24 mg/dL   Calcium  9.1 8.9 - 10.3 mg/dL   GFR, Estimated >39 >39 mL/min   Anion gap 13 5 - 15  CBC   Collection Time: 08/22/24 11:50 AM  Result Value Ref Range   WBC 6.6 4.0 - 10.5 K/uL   RBC 4.81 4.22 - 5.81 MIL/uL  Hemoglobin 15.9 13.0 - 17.0 g/dL   HCT 54.6 60.9 - 47.9 %   MCV 94.2 80.0 - 100.0 fL   MCH 33.1 26.0 - 34.0 pg   MCHC 35.1 30.0 - 36.0 g/dL   RDW 88.4 88.4 - 84.4 %   Platelets 211 150 - 400 K/uL   nRBC 0.0 0.0 - 0.2 %  Troponin T, High Sensitivity    Collection Time: 08/22/24 11:50 AM  Result Value Ref Range   Troponin T High Sensitivity <15 0 - 19 ng/L  D-dimer, quantitative   Collection Time: 08/22/24 12:23 PM  Result Value Ref Range   D-Dimer, Quant 0.27 0.00 - 0.50 ug/mL-FEU  Troponin T, High Sensitivity   Collection Time: 08/22/24  1:50 PM  Result Value Ref Range   Troponin T High Sensitivity <15 0 - 19 ng/L  POC CBG, ED   Collection Time: 08/22/24  2:50 PM  Result Value Ref Range   Glucose-Capillary 404 (H) 70 - 99 mg/dL  POC CBG, ED   Collection Time: 08/22/24  4:25 PM  Result Value Ref Range   Glucose-Capillary 298 (H) 70 - 99 mg/dL   *Note: Due to a large number of results and/or encounters for the requested time period, some results have not been displayed. A complete set of results can be found in Results Review.   DG Chest 2 View Result Date: 08/22/2024 CLINICAL DATA:  Chest pain. EXAM: CHEST - 2 VIEW COMPARISON:  July 30, 2022 FINDINGS: The heart size and mediastinal contours are within normal limits. Stable stimulator and associated stimulator wire positioning is seen overlying the right hilum. Low lung volumes are noted. Mild areas of scarring and/or atelectasis are seen within the mid left lung and bilateral lung bases. No pleural effusion or pneumothorax is identified. Multilevel degenerative changes are present throughout the thoracic spine. IMPRESSION: Low lung volumes with mild bilateral scarring and/or atelectasis. Electronically Signed   By: Suzen Dials M.D.   On: 08/22/2024 15:30     EKG: EKG Interpretation Date/Time:  Sunday August 22 2024 12:12:51 EST Ventricular Rate:  88 PR Interval:  154 QRS Duration:  86 QT Interval:  360 QTC Calculation: 435 R Axis:   14  Text Interpretation: Normal sinus rhythm No significant change since last tracing Confirmed by Bernard Drivers ([id number]) on 08/22/2024 12:23:35 PM  Radiology: DG Chest 2 View Result Date: 08/22/2024 CLINICAL DATA:  Chest pain. EXAM:  CHEST - 2 VIEW COMPARISON:  July 30, 2022 FINDINGS: The heart size and mediastinal contours are within normal limits. Stable stimulator and associated stimulator wire positioning is seen overlying the right hilum. Low lung volumes are noted. Mild areas of scarring and/or atelectasis are seen within the mid left lung and bilateral lung bases. No pleural effusion or pneumothorax is identified. Multilevel degenerative changes are present throughout the thoracic spine. IMPRESSION: Low lung volumes with mild bilateral scarring and/or atelectasis. Electronically Signed   By: Suzen Dials M.D.   On: 08/22/2024 15:30     Procedures   Medications Ordered in the ED  acetaminophen  (TYLENOL ) tablet 1,000 mg (1,000 mg Oral Given 08/22/24 1258)  alum & mag hydroxide-simeth (MAALOX/MYLANTA) 200-200-20 MG/5ML suspension 30 mL (30 mLs Oral Given 08/22/24 1258)  famotidine  (PEPCID ) tablet 20 mg (20 mg Oral Given 08/22/24 1258)  lactated ringers  bolus 1,000 mL (1,000 mLs Intravenous New Bag/Given 08/22/24 1451)  insulin  aspart (novoLOG ) injection 14 Units (10 Units Subcutaneous Given 08/22/24 1454)  Medical Decision Making Problems Addressed: Hyperglycemia: acute illness or injury with systemic symptoms that poses a threat to life or bodily functions Precordial chest pain: acute illness or injury with systemic symptoms that poses a threat to life or bodily functions  Amount and/or Complexity of Data Reviewed Independent Historian: EMS    Details: hx External Data Reviewed: notes. Labs: ordered. Decision-making details documented in ED Course. Radiology: ordered and independent interpretation performed. Decision-making details documented in ED Course. ECG/medicine tests: ordered and independent interpretation performed. Decision-making details documented in ED Course.  Risk OTC drugs. Prescription drug management. Decision regarding hospitalization.   Iv ns.  Continuous pulse ox and cardiac monitoring. Labs ordered/sent. Imaging ordered.   Differential diagnosis includes acs, msk cp, gi cp, etc. Dispo decision including potential need for admission considered - will get labs and imaging and reassess.   Reviewed nursing notes and prior charts for additional history. External reports reviewed. Additional history from: EMS.  Meds for symptom relief.   Cardiac monitor: sinus rhythm, rate 80.  Labs reviewed/interpreted by me - wbc and hgb normal. Glucose high, 517, hco3 and AG normal. IVF boluses, novolog  SQ. Trop x 2 normal and not increasing - after constant symptoms for hours, felt not c/w acs. Ddimer is normal.   Recheck pt comfortable, no pain or sob. Vitals normal. Glucose improved. Pt currently appears stable for ED d/c. Rec close cardiology f/u - referral provided.   Xrays reviewed/interpreted by me - no pna.   Rec close cardiology f/u.  Return precautions provided.       Final diagnoses:  None    ED Discharge Orders     None          Bernard Drivers, MD 08/22/24 1712  "

## 2024-08-22 NOTE — ED Notes (Signed)
 Patient discharged by RN. Patient ambulatory to lobby for ride.

## 2024-08-22 NOTE — Discharge Instructions (Signed)
 It was our pleasure to provide your ER care today - we hope that you feel better.  For recent chest pain, follow up closely with cardiologist in the coming week - we made referral, and they should be contacting you with an appointment in the next few days.   Your blood sugar is high - make sure to take your meds as prescribed, drink plenty of water/fluids/stay well hydrated, follow diabetes eating plan, monitor and record glucose levels and follow up closely with your doctor this week.   Return to ER right away if worse, new symptoms, fevers, recurrent/persistent chest pain, increased trouble breathing, or other concern.

## 2024-09-27 ENCOUNTER — Ambulatory Visit: Payer: Medicare (Managed Care) | Admitting: Family Medicine

## 2024-09-29 ENCOUNTER — Encounter: Payer: Self-pay | Admitting: Family Medicine

## 2024-09-29 ENCOUNTER — Telehealth: Payer: Self-pay

## 2024-09-29 ENCOUNTER — Other Ambulatory Visit (HOSPITAL_BASED_OUTPATIENT_CLINIC_OR_DEPARTMENT_OTHER): Payer: Self-pay

## 2024-09-29 ENCOUNTER — Ambulatory Visit: Admitting: Family Medicine

## 2024-09-29 ENCOUNTER — Ambulatory Visit: Payer: Self-pay

## 2024-09-29 VITALS — BP 129/77 | HR 79 | Ht 68.0 in | Wt 201.0 lb

## 2024-09-29 DIAGNOSIS — I10 Essential (primary) hypertension: Secondary | ICD-10-CM

## 2024-09-29 DIAGNOSIS — Z59861 Financial insecurity, difficulty paying for utilities: Secondary | ICD-10-CM | POA: Diagnosis not present

## 2024-09-29 DIAGNOSIS — Z9641 Presence of insulin pump (external) (internal): Secondary | ICD-10-CM | POA: Diagnosis not present

## 2024-09-29 DIAGNOSIS — F3341 Major depressive disorder, recurrent, in partial remission: Secondary | ICD-10-CM

## 2024-09-29 DIAGNOSIS — Z7984 Long term (current) use of oral hypoglycemic drugs: Secondary | ICD-10-CM

## 2024-09-29 DIAGNOSIS — Z5912 Inadequate housing utilities: Secondary | ICD-10-CM | POA: Diagnosis not present

## 2024-09-29 DIAGNOSIS — E119 Type 2 diabetes mellitus without complications: Secondary | ICD-10-CM

## 2024-09-29 MED ORDER — FLUCONAZOLE 150 MG PO TABS
150.0000 mg | ORAL_TABLET | Freq: Once | ORAL | 0 refills | Status: AC
  Filled 2024-09-29: qty 2, 2d supply, fill #0

## 2024-09-29 NOTE — Progress Notes (Signed)
 " Brandon [name] - 69 y.o. male MRN [id number]  Date of birth: [birth year]  Subjective Chief Complaint  Patient presents with   Depression   Diabetes   Hypertension   Thrush    HPI Brandon [name] is a 69 y.o. male here today for follow up.   He reports that he has had difficulty finding and maintaining a job which has led to increased financial strain.  He doesn't have resources for food or gas on a consistent basis.  Also having difficulty paying utilities and has reached out to [employer] and social services regarding assistance with this.  He is not taking medications on a regular basis due to costs.  Blood sugars have been in the 400-500 range recently.  He does report that he has plenty of insulin  that he is stockpiled however does not feel motivated to care for himself.  He is not taking his bupropion  or Abilify  regularly due to concerns about cost.  He has had some passive suicidal ideation but denies active ideation or plan.  He has had some oral thrush related to blood sugars being elevated.  ROS:  A comprehensive ROS was completed and negative except as noted per HPI  Allergies[1]  Past Medical History:  Diagnosis Date   Anxiety    Arthritis    Basal cell carcinoma    Basal cell carcinoma (BCC) in situ of skin    left neck   BCC (basal cell carcinoma of skin) 01/14/2024   On vertex scalp treated with Mohs 12/31/23    Chronic back pain    has spinal cord stimulator and wears buprenorphine  patch   Colon polyps    Coronary artery disease    S/p DES to mRCA in 12/21 // S/p DES to LCx x 2 in 12/2020 // Diff dz in small to mod Dx (not a good target for PCI); OM1 100 CTO; severe dz in small OM2 and PDA >> Med Rx   Depression    Diabetes mellitus without complication (HCC)    type 2   Fibromyalgia    Gallstones    GERD (gastroesophageal reflux disease)    Hepatitis B    HLD (hyperlipidemia)    Hypertension    Osteoarthritis    Pneumonia    PONV (postoperative nausea  and vomiting)    SBO (small bowel obstruction) (HCC)    Sleep apnea    does not use CPAP    Past Surgical History:  Procedure Laterality Date   CARDIAC CATHETERIZATION  01/17/2021   CATARACT EXTRACTION W/ INTRAOCULAR LENS  IMPLANT, BILATERAL     CHOLECYSTECTOMY     CHOLECYSTECTOMY, LAPAROSCOPIC     COLONOSCOPY WITH PROPOFOL  N/A 12/19/2022   Procedure: COLONOSCOPY WITH PROPOFOL ;  Surgeon: Stacia Glendia BRAVO, MD;  Location: [facility] ENDOSCOPY;  Service: Gastroenterology;  Laterality: N/A;   CORONARY STENT INTERVENTION N/A 08/15/2020   Procedure: CORONARY STENT INTERVENTION;  Surgeon: Dann Candyce RAMAN, MD;  Location: [facility] INVASIVE CV LAB;  Service: Cardiovascular;  Laterality: N/A;   CORONARY STENT INTERVENTION N/A 01/17/2021   Procedure: CORONARY STENT INTERVENTION;  Surgeon: Verlin Lonni BIRCH, MD;  Location: [facility] INVASIVE CV LAB;  Service: Cardiovascular;  Laterality: N/A;   CORONARY ULTRASOUND/IVUS N/A 08/15/2020   Procedure: Intravascular Ultrasound/IVUS;  Surgeon: Dann Candyce RAMAN, MD;  Location: [facility] INVASIVE CV LAB;  Service: Cardiovascular;  Laterality: N/A;   DRUG INDUCED ENDOSCOPY N/A 05/23/2022   Procedure: DRUG INDUCED SLEEP ENDOSCOPY;  Surgeon: Mable Lenis, MD;  Location: [facility] OR;  Service: ENT;  Laterality: N/A;   HERNIA REPAIR     IMPLANTATION OF HYPOGLOSSAL NERVE STIMULATOR Right 07/30/2022   Procedure: IMPLANTATION OF HYPOGLOSSAL NERVE STIMULATOR;  Surgeon: Mable Lenis, MD;  Location: [facility];  Service: ENT;  Laterality: Right;   LEFT HEART CATH AND CORONARY ANGIOGRAPHY N/A 08/15/2020   Procedure: LEFT HEART CATH AND CORONARY ANGIOGRAPHY;  Surgeon: Dann Candyce RAMAN, MD;  Location: [facility] INVASIVE CV LAB;  Service: Cardiovascular;  Laterality: N/A;   LEFT HEART CATH AND CORONARY ANGIOGRAPHY N/A 01/17/2021   Procedure: LEFT HEART CATH AND CORONARY ANGIOGRAPHY;  Surgeon: Verlin Lonni BIRCH, MD;  Location: [facility] INVASIVE CV LAB;  Service:  Cardiovascular;  Laterality: N/A;   LUMBAR FUSION     L3-5 with rods   MICRODISCECTOMY LUMBAR  2006   POLYPECTOMY  12/19/2022   Procedure: POLYPECTOMY;  Surgeon: Stacia Glendia BRAVO, MD;  Location: [facility] ENDOSCOPY;  Service: Gastroenterology;;   SPINAL CORD STIMULATOR INSERTION     blood clot removed from spinal cord   SPINE SURGERY     blood clot removed from spinal cord   XI ROBOTIC ASSISTED INGUINAL HERNIA REPAIR WITH MESH     x 3 surgeries    Social History   Socioeconomic History   Marital status: Married    Spouse name: Not on file   Number of children: 4   Years of education: 16   Highest education level: Associate degree: academic program  Occupational History   Occupation: disabled/ respiratory therapist  Tobacco Use   Smoking status: Former    Current packs/day: 0.00    Average packs/day: 1 pack/day for 30.0 years (30.0 ttl pk-yrs)    Types: Cigarettes    Start date: 40    Quit date: 2007    Years since quitting: 19.1   Smokeless tobacco: Never  Vaping Use   Vaping status: Never Used  Substance and Sexual Activity   Alcohol use: Not Currently   Drug use: Not Currently   Sexual activity: Yes  Other Topics Concern   Not on file  Social History Narrative   ** Merged History Encounter **       Social Drivers of Health   Tobacco Use: Medium Risk (09/29/2024)   Patient History    Smoking Tobacco Use: Former    Smokeless Tobacco Use: Never    Passive Exposure: Not on Actuary Strain: High Risk (05/26/2024)   Overall Financial Resource Strain (CARDIA)    Difficulty of Paying Living Expenses: Hard  Food Insecurity: Food Insecurity Present (05/26/2024)   Epic    Worried About Programme Researcher, Broadcasting/film/video in the Last Year: Often true    Ran Out of Food in the Last Year: Often true  Transportation Needs: No Transportation Needs (05/26/2024)   Epic    Lack of Transportation (Medical): No    Lack of Transportation (Non-Medical): No  Physical Activity:  Inactive (05/26/2024)   Exercise Vital Sign    Days of Exercise per Week: 0 days    Minutes of Exercise per Session: Not on file  Stress: Stress Concern Present (05/26/2024)   [employer] of Occupational Health - Occupational Stress Questionnaire    Feeling of Stress: Very much  Social Connections: Moderately Isolated (05/26/2024)   Social Connection and Isolation Panel    Frequency of Communication with Friends and Family: More than three times a week    Frequency of Social Gatherings with Friends and Family: More than three times a week  Attends Religious Services: 1 to 4 times per year    Active Member of Clubs or Organizations: No    Attends Banker Meetings: Not on file    Marital Status: Separated  Depression (PHQ2-9): Low Risk (08/14/2023)   Depression (PHQ2-9)    PHQ-2 Score: 0  Recent Concern: Depression (PHQ2-9) - Medium Risk (07/29/2023)   Depression (PHQ2-9)    PHQ-2 Score: 9  Alcohol Screen: Low Risk (05/26/2024)   Alcohol Screen    Last Alcohol Screening Score (AUDIT): 1  Housing: High Risk (05/26/2024)   Epic    Unable to Pay for Housing in the Last Year: Yes    Number of Times Moved in the Last Year: 1    Homeless in the Last Year: No  Utilities: Not At Risk (12/16/2023)   AHC Utilities    Threatened with loss of utilities: No  Health Literacy: Not on file    Family History  Problem Relation Age of Onset   COPD Mother    Anxiety disorder Mother    Depression Mother    Colon cancer Mother    Cancer Father        mets, origin unknown   Anxiety disorder Father    Depression Father    Coronary artery disease Father    CAD Brother    Leukemia Maternal Grandmother    Multiple sclerosis Daughter    Diabetes Neg Hx     Health Maintenance  Topic Date Due   Medicare Annual Wellness (AWV)  Never done   Hepatitis C Screening  Never done   OPHTHALMOLOGY EXAM  12/04/2023   HEMOGLOBIN A1C  06/16/2024   Influenza Vaccine  11/23/2024 (Originally  03/26/2024)   Zoster Vaccines- Shingrix (1 of 2) 12/27/2024 (Originally 09/13/1974)   COVID-19 Vaccine (3 - Pfizer risk series) 07/03/2025 (Originally 07/14/2020)   FOOT EXAM  11/03/2024   Diabetic kidney evaluation - Urine ACR  11/07/2024   Diabetic kidney evaluation - eGFR measurement  08/22/2025   Colonoscopy  12/18/2032   DTaP/Tdap/Td (3 - Td or Tdap) 09/14/2033   Pneumococcal Vaccine: 50+ Years  Completed   Meningococcal B Vaccine  Aged Out     ----------------------------------------------------------------------------------------------------------------------------------------------------------------------------------------------------------------- Physical Exam BP 129/77   Pulse 79   Ht 5' 8 (1.727 m)   Wt 201 lb (91.2 kg)   SpO2 99%   BMI 30.56 kg/m   Physical Exam Constitutional:      Appearance: Normal appearance.  Eyes:     General: No scleral icterus. Pulmonary:     Effort: Pulmonary effort is normal.     Breath sounds: Normal breath sounds.  Musculoskeletal:     Cervical back: Neck supple.  Neurological:     General: No focal deficit present.     Mental Status: He is alert.  Psychiatric:        Mood and Affect: Mood normal.        Behavior: Behavior normal.     ------------------------------------------------------------------------------------------------------------------------------------------------------------------------------------------------------------------- Assessment and Plan  Primary hypertension Blood pressure is well-controlled.  Recommend continuation of current medications..  Diabetes mellitus type 2, uncomplicated, on long term insulin  pump (HCC) Poorly controlled diabetes.  No longer on pump as he is not seeing endocrinology at this time.  He does have plenty of insulin  but is not taking regularly.  I encouraged him to use this regularly.  Low-carbohydrate diet encouraged.  Referral placed to be [facility]I for assistance with medications as  well as help with finances.  Major depression in partial remission  Depressive symptoms have worsened some.  He is not taking bupropion  or Abilify  regularly.  I did call over to his pharmacy and confirmed that combined doses of bupropion  were $13.  He does think he can afford these.  He does confirm that he does have Abilify  at home.  Encouraged to take these as directed.  Discussed availability of behavioral health urgent care.   Meds ordered this encounter  Medications   fluconazole  (DIFLUCAN ) 150 MG tablet    Sig: Take 1 tablet (150 mg total) by mouth once for 1 dose. Repeat after 3 days.    Dispense:  2 tablet    Refill:  0    No follow-ups on file.         [1]  Allergies Allergen Reactions   Kiwi Extract Swelling and Other (See Comments)    Mouth swelling   Other Swelling and Other (See Comments)    EGGPLANT = Mouth swelling   Atorvastatin  Other (See Comments)    myalgias   Penicillins Rash   Ancef [Cefazolin] Rash   Latex Rash   Levaquin [Levofloxacin] Rash   Peach [Prunus Persica] Other (See Comments)    These burn the mouth   "

## 2024-09-29 NOTE — Telephone Encounter (Signed)
 FYI Only or Action Required?: FYI only for provider: appointment scheduled on 2/4.  Patient was last seen in primary care on 05/27/2024 by Alvia Bring, DO.  Called Nurse Triage reporting Hyperglycemia.  Symptoms began today.  Interventions attempted: Prescription medications: Humalog  24 units, Jardiance .  Symptoms are: unchanged.  Triage Disposition: Call PCP Now  Patient/caregiver understands and will follow disposition?: Yes   Message from [facility] sent at 09/29/2024  8:26 AM EST  Reason for Triage: Patient states he has a serious case of thrush for the last week, happens when his blood sugar gets high, currently 417.   Reason for Disposition  Blood glucose > 400 mg/dL (77.7 mmol/L)  Answer Assessment - Initial Assessment Questions 1. BLOOD GLUCOSE: What is your blood glucose level?      417- he just ate breakfast and drank coffee  2. ONSET: When did you check the blood glucose?     30 minutes ago   3. USUAL RANGE: What is your glucose level usually? (e.g., usual fasting morning value, usual evening value)     180 in the AM   5. TYPE 1 or 2:  Do you know what type of diabetes you have?  (e.g., Type 1, Type 2, Gestational; doesn't know)      Type 2  6. INSULIN : Do you take insulin ? What type of insulin (s) do you use? What is the mode of delivery? (syringe, pen; injection or pump)?      Humalog  24 U he just took right before transfer to triage   7. DIABETES PILLS: Do you take any pills for your diabetes? If Yes, ask: Have you missed taking any pills recently?     No missed doses of jardiance    8. OTHER SYMPTOMS: Do you have any symptoms? (e.g., fever, frequent urination, difficulty breathing, dizziness, weakness, vomiting)     Frequent urination.     Patient called in to triage with complaints of hyperglycemia, oral thrush.  This has been ongoing since this AM the 417. Oral thrush x 1 week.   The patient stated the most current reading was 417-  he just ate breakfast and drank coffee, Humalog  24 U he just took right before transfer to triage.   Appointment scheduled for further evaluation; Patient agrees with the plan of care, and will reach out if symptoms worsen or persist.  Protocols used: Diabetes - High Blood Sugar-A-AH

## 2024-09-29 NOTE — Assessment & Plan Note (Addendum)
 Depressive symptoms have worsened some.  He is not taking bupropion  or Abilify  regularly.  I did call over to his pharmacy and confirmed that combined doses of bupropion  were $13.  He does think he can afford these.  He does confirm that he does have Abilify  at home.  Encouraged to take these as directed.  Discussed availability of behavioral health urgent care.

## 2024-09-29 NOTE — Patient Instructions (Signed)
 Take bupropion  and Abilify  regularly.  Follow up with me in 1 month.

## 2024-09-29 NOTE — Progress Notes (Signed)
 Complex Care Management Note  Care Guide Note 09/29/2024 Name: NAINOA WOLDT MRN: [id number] DOB: 06/22/1956  ARLYNN MCDERMID is a 69 y.o. year old male who sees Alvia Bring, DO for primary care. I reached out to Jerel ONEIDA Lunger by phone today to offer complex care management services.  Mr. Kunz was given information about Complex Care Management services today including:   The Complex Care Management services include support from the care team which includes your Nurse Care Manager, Clinical Social Worker, or Pharmacist.  The Complex Care Management team is here to help remove barriers to the health concerns and goals most important to you. Complex Care Management services are voluntary, and the patient may decline or stop services at any time by request to their care team member.   Complex Care Management Consent Status: Patient agreed to services and verbal consent obtained.   Follow up plan:  Telephone appointment with complex care management team member scheduled for:  2/18 and 2/19  Encounter Outcome:  Patient Scheduled

## 2024-09-29 NOTE — Assessment & Plan Note (Signed)
Blood pressure is well-controlled.  Recommend continuation of current medications. 

## 2024-09-29 NOTE — Telephone Encounter (Signed)
 The patient is scheduled with the provider on 09/29/24 at 1130 am to address the following symptoms. Note will be closed.

## 2024-09-29 NOTE — Assessment & Plan Note (Addendum)
 Poorly controlled diabetes.  No longer on pump as he is not seeing endocrinology at this time.  He does have plenty of insulin  but is not taking regularly.  I encouraged him to use this regularly.  Low-carbohydrate diet encouraged.  Referral placed to be [facility]BCI for assistance with medications as well as help with finances.

## 2024-09-30 ENCOUNTER — Other Ambulatory Visit (HOSPITAL_BASED_OUTPATIENT_CLINIC_OR_DEPARTMENT_OTHER): Payer: Self-pay

## 2024-09-30 LAB — BASIC METABOLIC PANEL WITH GFR
BUN/Creatinine Ratio: 8 — ABNORMAL LOW (ref 10–24)
BUN: 7 mg/dL — ABNORMAL LOW (ref 8–27)
CO2: 21 mmol/L (ref 20–29)
Calcium: 9.6 mg/dL (ref 8.6–10.2)
Chloride: 99 mmol/L (ref 96–106)
Creatinine, Ser: 0.83 mg/dL (ref 0.76–1.27)
Glucose: 238 mg/dL — ABNORMAL HIGH (ref 70–99)
Potassium: 4.4 mmol/L (ref 3.5–5.2)
Sodium: 139 mmol/L (ref 134–144)
eGFR: 95 mL/min/{1.73_m2}

## 2024-09-30 LAB — HEMOGLOBIN A1C
Est. average glucose Bld gHb Est-mCnc: 301 mg/dL
Hgb A1c MFr Bld: 12.1 % — ABNORMAL HIGH (ref 4.8–5.6)

## 2024-10-01 NOTE — Telephone Encounter (Signed)
" ° ° ° ° ° ° ° ° ° ° ° ° ° ° ° ° ° ° ° ° ° ° ° ° ° ° ° ° ° ° ° ° ° ° ° ° ° ° ° ° ° ° ° ° ° ° ° ° ° ° ° ° ° ° ° ° ° ° ° ° ° ° ° ° ° ° ° ° ° ° ° ° ° ° ° ° ° ° ° ° ° ° ° ° ° ° ° ° ° ° ° ° ° ° ° ° ° ° ° ° ° ° ° ° ° ° ° ° ° ° ° ° ° ° ° ° ° ° ° ° ° ° ° ° ° ° ° ° ° ° ° ° ° ° ° ° ° ° ° ° ° ° ° ° ° ° ° ° ° ° ° ° ° ° ° ° ° ° ° ° ° ° ° ° ° ° ° ° ° ° ° ° ° ° ° ° ° ° ° ° ° ° ° ° ° ° ° ° ° ° ° ° ° ° ° ° ° ° ° ° ° ° ° ° ° ° ° ° ° ° ° ° ° ° ° ° ° ° ° ° ° ° ° ° ° ° ° ° ° ° ° ° ° ° ° ° ° ° ° ° ° ° ° ° ° ° ° ° ° ° ° ° ° ° ° ° ° ° ° ° ° °  Copied from CRM [id number]. Topic: General - Other >> Oct 01, 2024  8:53 AM Rosaria BRAVO wrote: Reason for CRM: Pt called requesting samples of tresiba  because he cannot afford a prescription he says. Please advise   Best contact: [phone] "

## 2024-10-05 ENCOUNTER — Ambulatory Visit: Admitting: Family Medicine

## 2024-10-13 ENCOUNTER — Telehealth: Admitting: Licensed Clinical Social Worker

## 2024-10-13 ENCOUNTER — Other Ambulatory Visit

## 2024-10-14 ENCOUNTER — Other Ambulatory Visit
